# Patient Record
Sex: Female | Born: 1935 | Race: White | Hispanic: No | Smoking: Never smoker
Health system: Southern US, Community
[De-identification: ages and names within clinical notes are randomized; demographics above are authoritative.]

## PROBLEM LIST (undated history)

## (undated) DIAGNOSIS — T8859XA Other complications of anesthesia, initial encounter: Secondary | ICD-10-CM

## (undated) DIAGNOSIS — I5030 Unspecified diastolic (congestive) heart failure: Secondary | ICD-10-CM

## (undated) DIAGNOSIS — Z9889 Other specified postprocedural states: Secondary | ICD-10-CM

## (undated) DIAGNOSIS — D649 Anemia, unspecified: Secondary | ICD-10-CM

## (undated) DIAGNOSIS — I10 Essential (primary) hypertension: Secondary | ICD-10-CM

## (undated) DIAGNOSIS — Z7901 Long term (current) use of anticoagulants: Secondary | ICD-10-CM

## (undated) DIAGNOSIS — I4510 Unspecified right bundle-branch block: Secondary | ICD-10-CM

## (undated) DIAGNOSIS — F0150 Vascular dementia without behavioral disturbance: Secondary | ICD-10-CM

## (undated) DIAGNOSIS — K5790 Diverticulosis of intestine, part unspecified, without perforation or abscess without bleeding: Secondary | ICD-10-CM

## (undated) DIAGNOSIS — F0280 Dementia in other diseases classified elsewhere without behavioral disturbance: Secondary | ICD-10-CM

## (undated) DIAGNOSIS — J449 Chronic obstructive pulmonary disease, unspecified: Secondary | ICD-10-CM

## (undated) DIAGNOSIS — R112 Nausea with vomiting, unspecified: Secondary | ICD-10-CM

## (undated) DIAGNOSIS — F32A Depression, unspecified: Secondary | ICD-10-CM

## (undated) DIAGNOSIS — E039 Hypothyroidism, unspecified: Secondary | ICD-10-CM

## (undated) DIAGNOSIS — K52831 Collagenous colitis: Secondary | ICD-10-CM

## (undated) DIAGNOSIS — D0511 Intraductal carcinoma in situ of right breast: Secondary | ICD-10-CM

## (undated) DIAGNOSIS — E785 Hyperlipidemia, unspecified: Secondary | ICD-10-CM

## (undated) DIAGNOSIS — T4145XA Adverse effect of unspecified anesthetic, initial encounter: Secondary | ICD-10-CM

## (undated) DIAGNOSIS — I480 Paroxysmal atrial fibrillation: Secondary | ICD-10-CM

## (undated) DIAGNOSIS — Z9289 Personal history of other medical treatment: Secondary | ICD-10-CM

## (undated) DIAGNOSIS — Z8619 Personal history of other infectious and parasitic diseases: Secondary | ICD-10-CM

## (undated) DIAGNOSIS — M5136 Other intervertebral disc degeneration, lumbar region: Secondary | ICD-10-CM

## (undated) DIAGNOSIS — K219 Gastro-esophageal reflux disease without esophagitis: Secondary | ICD-10-CM

## (undated) DIAGNOSIS — G8929 Other chronic pain: Secondary | ICD-10-CM

## (undated) DIAGNOSIS — M545 Low back pain, unspecified: Secondary | ICD-10-CM

## (undated) DIAGNOSIS — I739 Peripheral vascular disease, unspecified: Secondary | ICD-10-CM

## (undated) DIAGNOSIS — M51369 Other intervertebral disc degeneration, lumbar region without mention of lumbar back pain or lower extremity pain: Secondary | ICD-10-CM

## (undated) DIAGNOSIS — M519 Unspecified thoracic, thoracolumbar and lumbosacral intervertebral disc disorder: Secondary | ICD-10-CM

## (undated) DIAGNOSIS — R609 Edema, unspecified: Secondary | ICD-10-CM

## (undated) DIAGNOSIS — N184 Chronic kidney disease, stage 4 (severe): Secondary | ICD-10-CM

## (undated) DIAGNOSIS — F329 Major depressive disorder, single episode, unspecified: Secondary | ICD-10-CM

## (undated) DIAGNOSIS — I82409 Acute embolism and thrombosis of unspecified deep veins of unspecified lower extremity: Secondary | ICD-10-CM

## (undated) DIAGNOSIS — M1990 Unspecified osteoarthritis, unspecified site: Secondary | ICD-10-CM

## (undated) DIAGNOSIS — C50911 Malignant neoplasm of unspecified site of right female breast: Secondary | ICD-10-CM

## (undated) HISTORY — PX: TMJ ARTHROPLASTY: SHX1066

## (undated) HISTORY — DX: Essential (primary) hypertension: I10

## (undated) HISTORY — DX: Hypothyroidism, unspecified: E03.9

## (undated) HISTORY — PX: APPENDECTOMY: SHX54

## (undated) HISTORY — PX: REVISION TOTAL KNEE ARTHROPLASTY: SUR1280

## (undated) HISTORY — PX: TOTAL KNEE ARTHROPLASTY: SHX125

## (undated) HISTORY — PX: LUMBAR DISC SURGERY: SHX700

## (undated) HISTORY — PX: ABDOMINAL EXPLORATION SURGERY: SHX538

## (undated) HISTORY — PX: VASCULAR SURGERY: SHX849

## (undated) HISTORY — PX: TONSILLECTOMY: SUR1361

## (undated) HISTORY — PX: HALLUX VALGUS CORRECTION: SUR315

## (undated) HISTORY — PX: BREAST SURGERY: SHX581

## (undated) HISTORY — PX: ABDOMINAL HYSTERECTOMY: SHX81

## (undated) HISTORY — PX: TEMPOROMANDIBULAR JOINT ARTHROPLASTY: SUR76

## (undated) HISTORY — DX: Intraductal carcinoma in situ of right breast: D05.11

## (undated) HISTORY — PX: BACK SURGERY: SHX140

## (undated) HISTORY — PX: COLONOSCOPY: SHX174

## (undated) HISTORY — PX: JOINT REPLACEMENT: SHX530

## (undated) HISTORY — PX: EYE SURGERY: SHX253

## (undated) HISTORY — DX: Hyperlipidemia, unspecified: E78.5

---

## 2004-02-01 ENCOUNTER — Ambulatory Visit: Payer: Self-pay | Admitting: Internal Medicine

## 2004-03-22 ENCOUNTER — Ambulatory Visit: Payer: Self-pay | Admitting: Internal Medicine

## 2004-11-29 ENCOUNTER — Ambulatory Visit: Payer: Self-pay | Admitting: Internal Medicine

## 2004-12-02 ENCOUNTER — Ambulatory Visit: Payer: Self-pay | Admitting: Internal Medicine

## 2005-01-31 ENCOUNTER — Ambulatory Visit: Payer: Self-pay | Admitting: Internal Medicine

## 2005-02-03 ENCOUNTER — Ambulatory Visit: Payer: Self-pay | Admitting: Internal Medicine

## 2005-03-23 ENCOUNTER — Ambulatory Visit: Payer: Self-pay | Admitting: Internal Medicine

## 2005-04-14 ENCOUNTER — Ambulatory Visit: Payer: Self-pay | Admitting: Gastroenterology

## 2005-04-17 DIAGNOSIS — C50911 Malignant neoplasm of unspecified site of right female breast: Secondary | ICD-10-CM

## 2005-04-17 HISTORY — PX: BREAST EXCISIONAL BIOPSY: SUR124

## 2005-04-17 HISTORY — DX: Malignant neoplasm of unspecified site of right female breast: C50.911

## 2005-04-25 ENCOUNTER — Ambulatory Visit: Payer: Self-pay | Admitting: Internal Medicine

## 2005-06-15 ENCOUNTER — Ambulatory Visit: Payer: Self-pay | Admitting: Surgery

## 2005-06-30 ENCOUNTER — Ambulatory Visit: Payer: Self-pay | Admitting: Surgery

## 2005-07-13 ENCOUNTER — Ambulatory Visit: Payer: Self-pay | Admitting: Oncology

## 2005-07-25 ENCOUNTER — Ambulatory Visit: Payer: Self-pay | Admitting: Radiation Oncology

## 2005-08-15 ENCOUNTER — Ambulatory Visit: Payer: Self-pay | Admitting: Radiation Oncology

## 2005-09-15 ENCOUNTER — Ambulatory Visit: Payer: Self-pay | Admitting: Radiation Oncology

## 2005-10-15 ENCOUNTER — Ambulatory Visit: Payer: Self-pay | Admitting: Radiation Oncology

## 2006-02-12 ENCOUNTER — Ambulatory Visit: Payer: Self-pay | Admitting: Oncology

## 2006-04-02 ENCOUNTER — Ambulatory Visit: Payer: Self-pay | Admitting: Radiation Oncology

## 2006-06-13 ENCOUNTER — Ambulatory Visit: Payer: Self-pay | Admitting: Oncology

## 2006-07-17 ENCOUNTER — Ambulatory Visit: Payer: Self-pay | Admitting: Oncology

## 2006-08-13 ENCOUNTER — Ambulatory Visit: Payer: Self-pay | Admitting: Oncology

## 2006-08-16 ENCOUNTER — Ambulatory Visit: Payer: Self-pay | Admitting: Oncology

## 2006-09-16 ENCOUNTER — Ambulatory Visit: Payer: Self-pay | Admitting: Radiation Oncology

## 2006-10-01 ENCOUNTER — Ambulatory Visit: Payer: Self-pay | Admitting: Radiation Oncology

## 2006-10-16 ENCOUNTER — Ambulatory Visit: Payer: Self-pay | Admitting: Radiation Oncology

## 2007-02-11 ENCOUNTER — Ambulatory Visit: Payer: Self-pay | Admitting: Oncology

## 2007-02-16 ENCOUNTER — Ambulatory Visit: Payer: Self-pay | Admitting: Oncology

## 2007-03-21 ENCOUNTER — Ambulatory Visit: Payer: Self-pay

## 2007-07-10 ENCOUNTER — Ambulatory Visit: Payer: Self-pay | Admitting: Oncology

## 2007-09-16 ENCOUNTER — Ambulatory Visit: Payer: Self-pay | Admitting: Radiation Oncology

## 2007-10-16 ENCOUNTER — Ambulatory Visit: Payer: Self-pay | Admitting: Oncology

## 2008-07-10 ENCOUNTER — Ambulatory Visit: Payer: Self-pay | Admitting: Internal Medicine

## 2008-09-18 ENCOUNTER — Ambulatory Visit: Payer: Self-pay | Admitting: Internal Medicine

## 2009-07-13 ENCOUNTER — Ambulatory Visit: Payer: Self-pay | Admitting: Internal Medicine

## 2009-07-16 ENCOUNTER — Ambulatory Visit: Payer: Self-pay | Admitting: Oncology

## 2009-07-19 ENCOUNTER — Ambulatory Visit: Payer: Self-pay | Admitting: Oncology

## 2009-08-15 ENCOUNTER — Ambulatory Visit: Payer: Self-pay | Admitting: Oncology

## 2010-07-18 ENCOUNTER — Ambulatory Visit: Payer: Self-pay | Admitting: Oncology

## 2010-07-25 ENCOUNTER — Ambulatory Visit: Payer: Self-pay | Admitting: Internal Medicine

## 2010-07-27 ENCOUNTER — Encounter: Payer: Self-pay | Admitting: Cardiology

## 2010-08-09 ENCOUNTER — Encounter: Payer: Self-pay | Admitting: Cardiology

## 2010-08-09 ENCOUNTER — Ambulatory Visit (INDEPENDENT_AMBULATORY_CARE_PROVIDER_SITE_OTHER): Payer: Medicare Other | Admitting: Cardiology

## 2010-08-09 DIAGNOSIS — I4891 Unspecified atrial fibrillation: Secondary | ICD-10-CM

## 2010-08-09 DIAGNOSIS — I10 Essential (primary) hypertension: Secondary | ICD-10-CM

## 2010-08-09 DIAGNOSIS — I480 Paroxysmal atrial fibrillation: Secondary | ICD-10-CM | POA: Insufficient documentation

## 2010-08-09 DIAGNOSIS — R079 Chest pain, unspecified: Secondary | ICD-10-CM

## 2010-08-09 NOTE — Assessment & Plan Note (Signed)
Vague chest pain. Plan Myoview.

## 2010-08-09 NOTE — Assessment & Plan Note (Addendum)
Patient has new onset atrial fibrillation. She has been on pradaxa 150 mg p.o. B.i.d. since April 11 of 2012. She is symptomatic with fatigue, dyspnea and worsening pedal edema. I will plan to proceed with elective cardioversion in 2 weeks. She will have been on therapeutic anticoagulation for over 3 weeks at that time. Continue present medications for rate control. If she develops recurrent atrial fibrillation following cardioversion then we will most likely add amiodarone. I will obtain laboratories from Dr. Ammie Ferrier office concerning most recent TSH. Schedule echocardiogram. She has embolic risk factors of hypertension, age 77 and female sex. She will therefore require long-term anticoagulation.

## 2010-08-09 NOTE — Assessment & Plan Note (Signed)
Blood pressure controlled with present medications. Will continue.

## 2010-08-09 NOTE — Progress Notes (Signed)
HPI: 77 yo female for evaluation of atrial fibrillation. Patient states that for the past one month she has noticed increasing fatigue, dyspnea on exertion, pedal edema and palpitations. She occasionally feels a tightness in her chest but this is not exertional. It resolved spontaneously. She was seen by Dr. Sabra Heck on July 27, 2010. He noted that she was in new onset atrial fibrillation and started her on Cardizem, beta blockade and pradaxa. She has noted increased edema but there has been response to diuretics. Because of her atrial fibrillation we were asked to further evaluate.  Current Outpatient Prescriptions  Medication Sig Dispense Refill  . calcium carbonate (OS-CAL) 600 MG TABS Take 600 mg by mouth 2 (two) times daily with a meal.        . celecoxib (CELEBREX) 200 MG capsule Take 200 mg by mouth daily.        . dabigatran (PRADAXA) 150 MG CAPS Take 150 mg by mouth every 12 (twelve) hours.        Marland Kitchen diltiazem (CARDIZEM) 90 MG tablet Take 90 mg by mouth 3 (three) times daily.        Marland Kitchen Fluvastatin Sodium (LESCOL PO) Take 30 mg by mouth daily.        Marland Kitchen levothyroxine (SYNTHROID, LEVOTHROID) 150 MCG tablet Take 150 mcg by mouth daily.        . metoprolol (LOPRESSOR) 50 MG tablet Take 50 mg by mouth 3 (three) times daily.        . Multiple Vitamin (MULTIVITAMIN) tablet Take 1 tablet by mouth daily.        Marland Kitchen omeprazole (PRILOSEC) 20 MG capsule Take 20 mg by mouth daily.        . potassium chloride (KLOR-CON) 10 MEQ CR tablet Take 10 mEq by mouth daily.        . tamoxifen (NOLVADEX) 20 MG tablet Take 20 mg by mouth daily.        Marland Kitchen torsemide (DEMADEX) 20 MG tablet Take 20 mg by mouth 2 (two) times daily.        Marland Kitchen venlafaxine (EFFEXOR-XR) 75 MG 24 hr capsule Take 75 mg by mouth daily.          No Known Allergies  Past Medical History  Diagnosis Date  . Hypertension   . Hyperlipidemia   . Hypothyroid   . Atrial fibrillation     Past Surgical History  Procedure Date  . Abdominal  hysterectomy   . Back surgery   . Total knee arthroplasty   . Tmj arthroplasty   . Tonsillectomy   . Appendectomy     History   Social History  . Marital Status: Married    Spouse Name: N/A    Number of Children: 2  . Years of Education: N/A   Occupational History  . Not on file.   Social History Main Topics  . Smoking status: Never Smoker   . Smokeless tobacco: Not on file  . Alcohol Use: No  . Drug Use: Not on file  . Sexually Active: Not on file   Other Topics Concern  . Not on file   Social History Narrative  . No narrative on file    Family History  Problem Relation Age of Onset  . Coronary artery disease Sister 61    MI    ROS: Fatigue but no fevers or chills, productive cough, hemoptysis, dysphasia, odynophagia, melena, hematochezia, dysuria, hematuria, rash, seizure activity, orthopnea, PND, claudication. Remaining systems are negative.  Physical Exam: General:  Well developed/well nourished in NAD Skin warm/dry Patient not depressed No peripheral clubbing Back-normal HEENT-normal/normal eyelids Neck supple/normal carotid upstroke bilaterally; no bruits; no JVD; no thyromegaly chest - CTA/ normal expansion CV - irregular/normal S1 and S2; no murmurs, rubs or gallops;  PMI nondisplaced Abdomen -NT/ND, no HSM, no mass, + bowel sounds, no bruit 2+ femoral pulses, no bruits Ext-1+ ankle edema, chords, 2+ DP Neuro-grossly nonfocal  ECG Atrial fibrillation, Nonspecific ST changes.

## 2010-08-09 NOTE — Patient Instructions (Addendum)
Your physician recommends that you schedule a follow-up appointment in: 8 weeks with Dr. Stanford Breed  Your physician has requested that you have an echocardiogram. Echocardiography is a painless test that uses sound waves to create images of your heart. It provides your doctor with information about the size and shape of your heart and how well your heart's chambers and valves are working. This procedure takes approximately one hour. There are no restrictions for this procedure. 427.31  Your physician has requested that you have a lexiscan myoview. For further information please visit [email or web address]. Please follow instruction sheet, as given. 786.5

## 2010-08-16 ENCOUNTER — Ambulatory Visit (HOSPITAL_COMMUNITY): Payer: Medicare Other | Attending: Cardiology

## 2010-08-16 ENCOUNTER — Ambulatory Visit: Payer: Self-pay | Admitting: Oncology

## 2010-08-16 ENCOUNTER — Ambulatory Visit (HOSPITAL_COMMUNITY): Payer: Medicare Other | Attending: Cardiology | Admitting: Radiology

## 2010-08-16 DIAGNOSIS — R0789 Other chest pain: Secondary | ICD-10-CM

## 2010-08-16 DIAGNOSIS — I4891 Unspecified atrial fibrillation: Secondary | ICD-10-CM

## 2010-08-16 DIAGNOSIS — R0602 Shortness of breath: Secondary | ICD-10-CM

## 2010-08-16 DIAGNOSIS — R079 Chest pain, unspecified: Secondary | ICD-10-CM | POA: Insufficient documentation

## 2010-08-16 MED ORDER — REGADENOSON 0.4 MG/5ML IV SOLN
0.4000 mg | Freq: Once | INTRAVENOUS | Status: AC
  Administered 2010-08-16: 0.4 mg via INTRAVENOUS

## 2010-08-16 MED ORDER — TECHNETIUM TC 99M TETROFOSMIN IV KIT
10.9000 | PACK | Freq: Once | INTRAVENOUS | Status: AC | PRN
  Administered 2010-08-16: 10.9 via INTRAVENOUS

## 2010-08-16 MED ORDER — TECHNETIUM TC 99M TETROFOSMIN IV KIT
33.0000 | PACK | Freq: Once | INTRAVENOUS | Status: AC | PRN
  Administered 2010-08-16: 33 via INTRAVENOUS

## 2010-08-16 NOTE — Progress Notes (Signed)
[place] [address] [phone]  Cardiology Nuclear Med Pinki Biviano Yeargan is a 77 y.o. female [id number] 09-20-1932   Nuclear Med Background Indication for Stress Test:  Evaluation for Ischemia and 07/27/10 New AFIB, pending cardioversion, started on pradaxa History:  No previous documented CAD Cardiac Risk Factors: Family History - CAD, Hypertension and Lipids  Symptoms:  Chest Tightness, DOE, Fatigue, Palpitations and SOB   Nuclear Pre-Procedure Caffeine/Decaff Intake:  None NPO After: 7:00pm   Lungs:  clear IV 0.9% NS with Angio Cath:  20g  IV Site: L Antecubital  IV Started by:  Irven Baltimore, RN  Chest Size (in):  36 Cup Size: B  Height: 5\' 10"  (1.778 m)  Weight:  176 lb (79.833 kg)  BMI:  Body mass index is 25.25 kg/(m^2). Tech Comments: Took Metoprolol, and Diltiazem 8:00 am today     Nuclear Med Study 1 or 2 day study: 1 day  Stress Test Type:  Carlton Adam  Reading MD: Glori Bickers, MD  Order Authorizing Provider:  B.Crenshaw  Resting Radionuclide: Technetium 67m Tetrofosmin  Resting Radionuclide Dose: 10.9 mCi   Stress Radionuclide:  Technetium 12m Tetrofosmin  Stress Radionuclide Dose: 33 mCi           Stress Protocol Rest HR:71 Stress HR: 81  Rest BP: 121/57 Stress BP: 124/58  Exercise Time (min): n/a METS: n/a   Predicted Max HR: 145 bpm % Max HR: 55.86 bpm Rate Pressure Product: 10044   Dose of Adenosine (mg):  n/a Dose of Lexiscan: 0.4 mg  Dose of Atropine (mg): n/a Dose of Dobutamine: n/a mcg/kg/min (at max HR)  Stress Test Technologist: Perrin Maltese, EMT-P  Nuclear Technologist:  Charlton Amor, CNMT     Rest Procedure:  Myocardial perfusion imaging was performed at rest 45 minutes following the intravenous administration of Technetium 71m Tetrofosmin. Rest ECG: Atrial Fibrilliation  Stress Procedure:  The patient received IV Lexiscan 0.4 mg over 15-seconds.  Technetium 78m  Tetrofosmin injected at 30-seconds.  There were no significant changes with Lexiscan.  Quantitative spect images were obtained after a 45 minute delay. Stress ECG: No significant ST segment change suggestive of ischemia.  QPS Raw Data Images:  Normal; no motion artifact; normal heart/lung ratio. Stress Images:  Normal homogeneous uptake in all areas of the myocardium. Rest Images:  Normal homogeneous uptake in all areas of the myocardium. Subtraction (SDS):  Normal Transient Ischemic Dilatation (Normal <1.22): .95 Lung/Heart Ratio (Normal <0.45):  .32  Quantitative Gated Spect Images QGS EDV:  60 ml QGS ESV:  16 ml QGS cine images:  NL LV Function; NL Wall Motion QGS EF: 73%  Impression Exercise Capacity:  Lexiscan with no exercise. BP Response:  n/a Clinical Symptoms:  n/a ECG Impression:  No significant ECG changes with Lexiscan. Comparison with Prior Nuclear Study: No previous nuclear study performed  Overall Impression:  Normal stress nuclear study.       Daniel Bensimhon

## 2010-08-17 NOTE — Progress Notes (Signed)
COPY ROUTED TO DR.CRENSHAW.[place]

## 2010-08-22 NOTE — Progress Notes (Signed)
pt aware of results [name]  

## 2010-08-25 ENCOUNTER — Ambulatory Visit (HOSPITAL_COMMUNITY)
Admission: RE | Admit: 2010-08-25 | Discharge: 2010-08-25 | Disposition: A | Discharge status: Home / Self Care | Payer: Medicare Other | Source: Ambulatory Visit | Attending: Cardiology | Admitting: Cardiology

## 2010-08-25 DIAGNOSIS — I4891 Unspecified atrial fibrillation: Secondary | ICD-10-CM | POA: Insufficient documentation

## 2010-08-25 LAB — BASIC METABOLIC PANEL
Calcium: 9.4 mg/dL (ref 8.4–10.5)
GFR calc non Af Amer: 53 mL/min — ABNORMAL LOW (ref >60)
Potassium: 4.3 mEq/L (ref 3.5–5.1)
Sodium: 143 mEq/L (ref 135–145)

## 2010-08-25 LAB — CBC
MCHC: 33.6 g/dL (ref 30.0–36.0)
Platelets: 193 10*3/uL (ref 150–400)
RDW: 12.8 % (ref 11.5–15.5)
WBC: 9 10*3/uL (ref 4.0–10.5)

## 2010-08-26 HISTORY — PX: CARDIOVERSION: SHX1299

## 2010-08-26 NOTE — Assessment & Plan Note (Addendum)
  [facility]  NAME:  [name], [name] NO.:  [id number]  MEDICAL RECORD NO.:  [id number]      DATE OF BIRTH:  Jun 03, 1932  PHYSICIAN:  Denice Bors. Stanford Breed, MD, [facility] VISIT DATE:  08/25/2010                                  OFFICE VISIT   This is cardioversion of atrial fibrillation.  The patient was sedated by Anesthesia with Diprivan 100 mg intravenously.  Synchronized cardioversion with 120 joules resulted in normal sinus rhythm.  There were no immediate complications.  We would recommend continuing all medications including Pradaxa and follow up as scheduled.     Denice Bors Stanford Breed, MD, [facility]     BSC/MEDQ  D:  08/25/2010  T:  08/26/2010  Job:  [id number]  Electronically Signed by Kirk Ruths MD [facility] on 08/29/2010 06:11:41 PM

## 2010-08-29 ENCOUNTER — Encounter: Payer: Self-pay | Admitting: Cardiology

## 2010-10-06 ENCOUNTER — Encounter: Payer: Self-pay | Admitting: Cardiology

## 2010-10-06 ENCOUNTER — Ambulatory Visit (INDEPENDENT_AMBULATORY_CARE_PROVIDER_SITE_OTHER): Payer: Medicare Other | Admitting: Cardiology

## 2010-10-06 DIAGNOSIS — Z79899 Other long term (current) drug therapy: Secondary | ICD-10-CM

## 2010-10-06 DIAGNOSIS — I4891 Unspecified atrial fibrillation: Secondary | ICD-10-CM

## 2010-10-06 DIAGNOSIS — I10 Essential (primary) hypertension: Secondary | ICD-10-CM

## 2010-10-06 LAB — BASIC METABOLIC PANEL
CO2: 30 mEq/L (ref 19–32)
Calcium: 9 mg/dL (ref 8.4–10.5)
Chloride: 107 mEq/L (ref 96–112)
Sodium: 142 mEq/L (ref 135–145)

## 2010-10-06 LAB — CBC WITH DIFFERENTIAL/PLATELET
Basophils Relative: 0.4 % (ref 0.0–3.0)
Eosinophils Absolute: 0.2 10*3/uL (ref 0.0–0.7)
Eosinophils Relative: 1.4 % (ref 0.0–5.0)
Hemoglobin: 13.5 g/dL (ref 12.0–15.0)
Lymphocytes Relative: 23.4 % (ref 12.0–46.0)
MCHC: 34.1 g/dL (ref 30.0–36.0)
MCV: 98.5 fl (ref 78.0–100.0)
Neutro Abs: 7.8 10*3/uL — ABNORMAL HIGH (ref 1.4–7.7)
RBC: 4.02 Mil/uL (ref 3.87–5.11)
WBC: 12.1 10*3/uL — ABNORMAL HIGH (ref 4.5–10.5)

## 2010-10-06 NOTE — Patient Instructions (Signed)
Your physician recommends that you schedule a follow-up appointment in: [place] physician recommends that you return for lab work in: [place] CD 240MG  [place]

## 2010-10-06 NOTE — Assessment & Plan Note (Signed)
Patient remains in sinus rhythm. Continue beta blocker and calcium blocker. She has embolic risk factors of female sex, age and hypertension. Continue pradaxa. Check renal function and hemoglobin. Patient has recurrent atrial fibrillation in the future will consider amiodarone.

## 2010-10-06 NOTE — Progress Notes (Signed)
HPI: 77 yo female I initially saw in April of 2012 for evaluation of atrial fibrillation. Echocardiogram in May 2012 showed normal LV function, moderate left atrial enlargement, mild right atrial enlargement, mild aortic and mitral regurgitation and mild-to-moderate tricuspid regurgitation. Myoview in May of 2012 showed an ejection fraction of 73% and normal perfusion. Patient underwent elective DCCV on 08/26/10. Since then, her dyspnea is much improved. She does have some dyspnea on exertion walking up hills. There is no orthopnea, PND, chest pain, palpitations, syncope or bleeding. Her pedal edema has improved but she continues to have mild edema.   Current Outpatient Prescriptions  Medication Sig Dispense Refill  . calcium carbonate (OS-CAL) 600 MG TABS Take 600 mg by mouth 2 (two) times daily with a meal.        . celecoxib (CELEBREX) 200 MG capsule Take 200 mg by mouth daily.        . dabigatran (PRADAXA) 150 MG CAPS Take 150 mg by mouth every 12 (twelve) hours.        Marland Kitchen diltiazem (CARDIZEM) 90 MG tablet Take 90 mg by mouth 3 (three) times daily.        Marland Kitchen Fluvastatin Sodium (LESCOL PO) Take 30 mg by mouth daily.        Marland Kitchen levothyroxine (SYNTHROID, LEVOTHROID) 150 MCG tablet Take 150 mcg by mouth daily.        . metoprolol (LOPRESSOR) 50 MG tablet Take 50 mg by mouth 3 (three) times daily.        . Multiple Vitamin (MULTIVITAMIN) tablet Take 1 tablet by mouth daily.        Marland Kitchen omeprazole (PRILOSEC) 20 MG capsule Take 20 mg by mouth daily.        . pantoprazole (PROTONIX) 20 MG tablet Take 20 mg by mouth 2 (two) times daily.        . potassium chloride (KLOR-CON) 10 MEQ CR tablet Take 10 mEq by mouth daily.        Marland Kitchen torsemide (DEMADEX) 20 MG tablet Take 20 mg by mouth 2 (two) times daily.        Marland Kitchen venlafaxine (EFFEXOR-XR) 75 MG 24 hr capsule Take 75 mg by mouth daily.        Marland Kitchen DISCONTD: tamoxifen (NOLVADEX) 20 MG tablet Take 20 mg by mouth daily.           Past Medical History  Diagnosis Date    . Hypertension   . Hyperlipidemia   . Hypothyroid   . Atrial fibrillation     Past Surgical History  Procedure Date  . Abdominal hysterectomy   . Back surgery   . Total knee arthroplasty   . Tmj arthroplasty   . Tonsillectomy   . Appendectomy     History   Social History  . Marital Status: Married    Spouse Name: N/A    Number of Children: 2  . Years of Education: N/A   Occupational History  . Not on file.   Social History Main Topics  . Smoking status: Never Smoker   . Smokeless tobacco: Not on file  . Alcohol Use: No  . Drug Use: Not on file  . Sexually Active: Not on file   Other Topics Concern  . Not on file   Social History Narrative  . No narrative on file    ROS: no fevers or chills, productive cough, hemoptysis, dysphasia, odynophagia, melena, hematochezia, dysuria, hematuria, rash, seizure activity, orthopnea, PND, pedal edema, claudication. Remaining systems are negative.  Physical Exam: Well-developed well-nourished in no acute distress.  Skin is warm and dry.  HEENT is normal.  Neck is supple. No thyromegaly.  Chest is clear to auscultation with normal expansion.  Cardiovascular exam is regular rate and rhythm.  Abdominal exam nontender or distended. No masses palpated. Extremities show 1+ ankle edema. neuro grossly intact  ECG Normal sinus rhythm at a rate of 66. No ST changes.

## 2010-10-06 NOTE — Assessment & Plan Note (Signed)
Blood pressure controlled. Continue present medications. 

## 2010-10-18 NOTE — Progress Notes (Signed)
Addended by: Doug Sou D on: 10/18/2010 12:13 PM   Modules accepted: Orders

## 2010-10-21 ENCOUNTER — Other Ambulatory Visit (HOSPITAL_BASED_OUTPATIENT_CLINIC_OR_DEPARTMENT_OTHER): Payer: Self-pay | Admitting: General Surgery

## 2010-10-21 ENCOUNTER — Encounter (HOSPITAL_BASED_OUTPATIENT_CLINIC_OR_DEPARTMENT_OTHER): Payer: Medicare Other | Attending: General Surgery

## 2010-10-21 ENCOUNTER — Ambulatory Visit (HOSPITAL_COMMUNITY)
Admission: RE | Admit: 2010-10-21 | Discharge: 2010-10-21 | Disposition: A | Discharge status: Home / Self Care | Payer: Medicare Other | Source: Ambulatory Visit | Attending: General Surgery | Admitting: General Surgery

## 2010-10-21 DIAGNOSIS — Z9089 Acquired absence of other organs: Secondary | ICD-10-CM | POA: Insufficient documentation

## 2010-10-21 DIAGNOSIS — I739 Peripheral vascular disease, unspecified: Secondary | ICD-10-CM | POA: Insufficient documentation

## 2010-10-21 DIAGNOSIS — E039 Hypothyroidism, unspecified: Secondary | ICD-10-CM | POA: Insufficient documentation

## 2010-10-21 DIAGNOSIS — Z923 Personal history of irradiation: Secondary | ICD-10-CM | POA: Insufficient documentation

## 2010-10-21 DIAGNOSIS — Z79899 Other long term (current) drug therapy: Secondary | ICD-10-CM | POA: Insufficient documentation

## 2010-10-21 DIAGNOSIS — M25579 Pain in unspecified ankle and joints of unspecified foot: Secondary | ICD-10-CM | POA: Insufficient documentation

## 2010-10-21 DIAGNOSIS — M869 Osteomyelitis, unspecified: Secondary | ICD-10-CM

## 2010-10-21 DIAGNOSIS — Z90710 Acquired absence of both cervix and uterus: Secondary | ICD-10-CM | POA: Insufficient documentation

## 2010-10-21 DIAGNOSIS — Z853 Personal history of malignant neoplasm of breast: Secondary | ICD-10-CM | POA: Insufficient documentation

## 2010-10-21 DIAGNOSIS — L97309 Non-pressure chronic ulcer of unspecified ankle with unspecified severity: Secondary | ICD-10-CM | POA: Insufficient documentation

## 2010-10-21 DIAGNOSIS — Z96659 Presence of unspecified artificial knee joint: Secondary | ICD-10-CM | POA: Insufficient documentation

## 2010-10-21 DIAGNOSIS — I10 Essential (primary) hypertension: Secondary | ICD-10-CM | POA: Insufficient documentation

## 2010-10-22 NOTE — Assessment & Plan Note (Unsigned)
[facility]  NAME:  [name], [name] NO.:  [id number]  MEDICAL RECORD NO.:  [id number]      DATE OF BIRTH:  May 15, 1932  PHYSICIAN:  Kathrin Penner, M.D.    VISIT DATE:  10/21/2010                                  OFFICE VISIT   PROBLEMS:  Nonhealing ulcer of the right lateral malleolus present for the past 3 months.  The patient's current therapy has been with Bactroban b.i.d. without any significant success towards healing.  The surrounding tissues are swollen and red and quite exquisitely painful.  CURRENT MEDICATIONS:  Calcium supplements, Pradaxa, Cardizem, Synthroid, Lopressor, Prilosec, Demadex, Effexor, pravastatin and multivitamins.  ALLERGIES:  No known drug allergies.  PAST MEDICAL AND SURGICAL HISTORY:  Peripheral artery disease, history of atrial fibrillation, status post cardioversion, hypothyroidism, hypertension.  The patient is status post right-sided lumpectomy, carcinoma of the breasts followed by radiation therapy.  Appendectomy in the past, total knee replacement on the right side in the past, low back surgery, total abdominal hysterectomy, nephrostomy.  SOCIAL HISTORY:  This is married white English-speaking female, no tobacco, alcohol or recreational drug use.  She is competent here today with her spouse.  REVIEW OF SYSTEMS:  Negative except as above.  PHYSICAL EXAMINATION:  VITAL SIGNS:  This patient is 5 feet and 10 inches, weighing 175 pounds, temperature is 98.7, pulse is 58, respirations 16, blood pressure is 146/70. HEAD AND NECK:  The head is normocephalic.  There is no scleral icterus. The oropharynx and nasopharynx are benign.  Neck is supple.  There is no thyromegaly or adenopathy. LUNGS:  Clear to auscultation bilaterally. HEART:  Shows a regular rate and rhythm.  There are no rubs or gallop rhythms heard. ABDOMEN:  Soft, nontender, nondistended.  Bowel sounds are normoactive. EXTREMITIES:  On the  right show a slight edema with erythema and exquisite tenderness surrounding a 0.3 x 0.3 x 0.1 cm ulcer, which is right over the right lateral malleolus.  There is minimal drainage and no odor from this wound.  TREATMENT PLAN:  The wound is clean and probed.  I was not able to probe down to the bone.  Cultures are done and we will send it for x-rays to rule out the possibility of osteomyelitis.  In the interim, we will go ahead and check her hemoglobin A1c, complete metabolic panel, beta- natriuretic peptide, and CBC and sed rate.  Treatment to the base of the wound would be Medihoney the surrounding tissues, treated with Bactroban.  I have given her prescription for doxycycline 100 mg b.i.d.  We will follow up with her again in 1 week.     Kathrin Penner, M.D.     PB/MEDQ  D:  10/21/2010  T:  10/22/2010  Job:  [id number]

## 2010-11-18 ENCOUNTER — Encounter (HOSPITAL_BASED_OUTPATIENT_CLINIC_OR_DEPARTMENT_OTHER): Payer: Medicare Other

## 2011-01-10 ENCOUNTER — Ambulatory Visit: Payer: Medicare Other | Admitting: Cardiology

## 2011-02-03 ENCOUNTER — Encounter: Payer: Self-pay | Admitting: Nurse Practitioner

## 2011-02-03 ENCOUNTER — Encounter: Payer: Self-pay | Admitting: Cardiothoracic Surgery

## 2011-02-14 ENCOUNTER — Ambulatory Visit: Payer: Medicare Other | Admitting: Cardiology

## 2011-02-16 ENCOUNTER — Encounter: Payer: Self-pay | Admitting: Cardiothoracic Surgery

## 2011-02-16 ENCOUNTER — Encounter: Payer: Self-pay | Admitting: Nurse Practitioner

## 2011-03-18 ENCOUNTER — Encounter: Payer: Self-pay | Admitting: Cardiothoracic Surgery

## 2011-03-18 ENCOUNTER — Encounter: Payer: Self-pay | Admitting: Nurse Practitioner

## 2011-04-18 HISTORY — PX: OTHER SURGICAL HISTORY: SHX169

## 2011-04-18 HISTORY — PX: MASTECTOMY: SHX3

## 2011-05-30 ENCOUNTER — Encounter: Payer: Self-pay | Admitting: Physician Assistant

## 2011-09-13 ENCOUNTER — Ambulatory Visit: Payer: Self-pay | Admitting: Oncology

## 2011-09-13 ENCOUNTER — Encounter: Payer: Self-pay | Admitting: Physician Assistant

## 2011-09-13 LAB — CBC CANCER CENTER
Basophil #: 0.1 x10 3/mm (ref 0.0–0.1)
Eosinophil #: 0.2 x10 3/mm (ref 0.0–0.7)
HCT: 39.1 % (ref 35.0–47.0)
HGB: 12.8 g/dL (ref 12.0–16.0)
Lymphocyte #: 2.2 x10 3/mm (ref 1.0–3.6)
Monocyte #: 1.1 x10 3/mm — ABNORMAL HIGH (ref 0.2–0.9)
Neutrophil #: 5 x10 3/mm (ref 1.4–6.5)
Neutrophil %: 58.3 %
Platelet: 264 x10 3/mm (ref 150–440)

## 2011-09-13 LAB — COMPREHENSIVE METABOLIC PANEL
Alkaline Phosphatase: 84 U/L (ref 50–136)
Anion Gap: 10 (ref 7–16)
Bilirubin,Total: 0.4 mg/dL (ref 0.2–1.0)
Chloride: 102 mmol/L (ref 98–107)
Co2: 30 mmol/L (ref 21–32)
Creatinine: 1.61 mg/dL — ABNORMAL HIGH (ref 0.60–1.30)
EGFR (Non-African Amer.): 31 — ABNORMAL LOW
Glucose: 95 mg/dL (ref 65–99)
Potassium: 3.6 mmol/L (ref 3.5–5.1)
SGOT(AST): 19 U/L (ref 15–37)
Total Protein: 7.1 g/dL (ref 6.4–8.2)

## 2011-09-14 ENCOUNTER — Encounter: Payer: Self-pay | Admitting: Physician Assistant

## 2011-09-14 ENCOUNTER — Ambulatory Visit: Payer: Medicare Other | Admitting: Physician Assistant

## 2011-09-14 ENCOUNTER — Ambulatory Visit (INDEPENDENT_AMBULATORY_CARE_PROVIDER_SITE_OTHER): Payer: Medicare Other | Admitting: Physician Assistant

## 2011-09-14 VITALS — BP 102/58 | HR 81 | Ht 70.0 in | Wt 185.0 lb

## 2011-09-14 DIAGNOSIS — I4891 Unspecified atrial fibrillation: Secondary | ICD-10-CM

## 2011-09-14 DIAGNOSIS — N289 Disorder of kidney and ureter, unspecified: Secondary | ICD-10-CM

## 2011-09-14 DIAGNOSIS — I10 Essential (primary) hypertension: Secondary | ICD-10-CM

## 2011-09-14 MED ORDER — DILTIAZEM HCL ER COATED BEADS 240 MG PO CP24: 240.0000 mg | ORAL_CAPSULE | Freq: Every day | ORAL | Status: DC

## 2011-09-14 MED ORDER — METOPROLOL TARTRATE 100 MG PO TABS: 100.0000 mg | ORAL_TABLET | Freq: Two times a day (BID) | ORAL | Status: DC

## 2011-09-14 NOTE — Patient Instructions (Addendum)
Your physician recommends that you schedule a follow-up appointment in: 10/17/1308:15 WITH DR. CRENSHAW  STOP DILTIAZEM 90 MG AND LOPRESSOR 50 MG   START CARDIZEM CD 240 MG 1 CAP DAILY START LOPRESSOR 100 MG TWICE DAILY

## 2011-09-14 NOTE — Progress Notes (Signed)
[address], [place]  [address] Phone: [phone] Fax:  [phone]  Date:  09/14/2011   Name:  Shalamar Juhasz Dragos   DOB:  [birth year]   MRN:  [id number]  PCP:  Rusty Aus., MD, MD  Primary Cardiologist:  Dr. Kirk Ruths  Primary Electrophysiologist:  None    History of Present Illness: [name] is a 78 y.o. female who returns for follow up on AFib.    Established with Dr. Kirk Ruths in 07/2010 for evaluation of atrial fibrillation.  Echocardiogram 5/12: normal LV function, moderate LAE, mild RAE, mild AI and MR and mild-to-moderate TR.  Myoview 5/ 12 showed an ejection fraction of 73% and normal perfusion.   Patient underwent elective DCCV on 08/26/10.  Last seen by Dr. Kirk Ruths in 6/12 with plans for 3 mos follow up.    She has recently been going to [facility] for treatment of the venous stasis ulcer on her right foot.  She recently saw her PCP with complaints of increased heart rate, fatigue and shortness of breath.  She was noted to be back in atrial fibrillation.  Her rate was apparently controlled and no medication changes were made.  She was asked to follow up here.  She is having symptoms like she did when she was in Atrial fibrillation previously.  She notes rapid palpitations with increased activity.  She notes some tightness in her chest associated with this.  She notes dyspnea with more extreme activities.  She denies orthopnea, PND.  Her lower extremity edema is unchanged.  She does get lightheaded at times with increased activity.  Wt Readings from Last 3 Encounters:  09/14/11 185 lb (83.915 kg)  10/06/10 178 lb (80.74 kg)  08/16/10 176 lb (79.833 kg)    Labs obtained at the cancer center in [place] yesterday: Hemoglobin 12.8, BUN 32, creatinine 1.61  Past Medical History  Diagnosis Date  . Hypertension   . Hyperlipidemia   . Hypothyroid   . Atrial fibrillation     Echocardiogram 5/12: normal LV function, moderate LAE,  mild RAE, mild AI and MR and mild-to-moderate TR.  Myoview 5/ 12 showed an ejection fraction of 73% and normal perfusion.   Patient underwent elective DCCV on 08/26/10    Current Outpatient Prescriptions  Medication Sig Dispense Refill  . calcium carbonate (OS-CAL) 600 MG TABS Take 600 mg by mouth 2 (two) times daily with a meal.        . celecoxib (CELEBREX) 200 MG capsule Take 200 mg by mouth daily.        . dabigatran (PRADAXA) 150 MG CAPS Take 150 mg by mouth every 12 (twelve) hours.        Marland Kitchen diltiazem (CARDIZEM) 90 MG tablet Take 90 mg by mouth 3 (three) times daily.        Marland Kitchen Fluvastatin Sodium (LESCOL PO) Take 30 mg by mouth daily.        Marland Kitchen levothyroxine (SYNTHROID, LEVOTHROID) 150 MCG tablet Take 150 mcg by mouth daily.        . metoprolol (LOPRESSOR) 50 MG tablet Take 50 mg by mouth 3 (three) times daily.        . Multiple Vitamin (MULTIVITAMIN) tablet Take 1 tablet by mouth daily.        Marland Kitchen omeprazole (PRILOSEC) 20 MG capsule Take 20 mg by mouth daily.        . pantoprazole (PROTONIX) 20 MG tablet Take 20 mg by mouth 2 (  two) times daily.        . potassium chloride (KLOR-CON) 10 MEQ CR tablet Take 10 mEq by mouth daily.        Marland Kitchen torsemide (DEMADEX) 20 MG tablet Take 20 mg by mouth 2 (two) times daily.        Marland Kitchen venlafaxine (EFFEXOR-XR) 75 MG 24 hr capsule Take 75 mg by mouth daily.          Allergies: No Known Allergies  History  Substance Use Topics  . Smoking status: Never Smoker   . Smokeless tobacco: Not on file  . Alcohol Use: No     ROS:  Please see the history of present illness.   She has bilateral leg pain.  She has apparently had ABIs performed in the past. All other systems reviewed and negative.   PHYSICAL EXAM: VS:  BP 102/58  Pulse 81  Ht 5\' 10"  (1.778 m)  Wt 185 lb (83.915 kg)  BMI 26.54 kg/m2 Well nourished, well developed, in no acute distress HEENT: normal Neck: no JVD Endocrine: No thyromegaly Cardiac:  normal S1, S2; Irregularly irregular rhythm; no  murmur Lungs:  clear to auscultation bilaterally, no wheezing, rhonchi or rales Abd: soft, nontender, no hepatomegaly Ext: 1-2+ bilateral LE edema;  Boot noted on right foot Skin: warm and dry Neuro:  CNs 2-12 intact, no focal abnormalities noted  EKG:  Coarse atrial fibrillation, heart rate 81, normal axis, nonspecific ST-T wave changes   ASSESSMENT AND PLAN:  1.  Atrial Fibrillation She remains on Pradaxa. Her heart rate is well controlled. She is taking diltiazem 3 times a day as well as Lopressor 3 times a day.  To simplify her medications, I have changed her to Cardizem CD 240 mg a day and metoprolol 100 mg b.i.d. I discussed her case with Dr. Lovena Le (DOD).  He prefers that she followup with Dr. Stanford Breed to discuss further medication adjustments (i.e. Antiarrhythmics). I will have her followup with Dr. Stanford Breed.  Of note, the patient lives in [place] and would prefer to see someone in our [place] office as it is closer to her home.  She is questioning whether or not she can see Dr. Caryl Comes.  I will discuss this further with Dr. Stanford Breed.  2.  Renal Insufficiency She is on Demadex for lower extremity edema.  Her primary care physician is adjusting her diuretics currently.  We will need to keep an eye on her renal function as she is on Pradaxa.  I have asked her to followup with her PCP regarding diuretic dose adjustments.  3.  Hypertension Controlled    Signed, Richardson Dopp, PA-C  1:54 PM 09/14/2011

## 2011-09-16 ENCOUNTER — Ambulatory Visit: Payer: Self-pay | Admitting: Oncology

## 2011-10-04 ENCOUNTER — Ambulatory Visit: Payer: Self-pay | Admitting: Internal Medicine

## 2011-10-11 ENCOUNTER — Ambulatory Visit: Payer: Self-pay | Admitting: Internal Medicine

## 2011-10-18 ENCOUNTER — Encounter: Payer: Self-pay | Admitting: Cardiology

## 2011-10-18 ENCOUNTER — Ambulatory Visit (INDEPENDENT_AMBULATORY_CARE_PROVIDER_SITE_OTHER): Payer: Medicare Other | Admitting: Cardiology

## 2011-10-18 VITALS — BP 122/68 | HR 86 | Ht 70.0 in | Wt 184.4 lb

## 2011-10-18 DIAGNOSIS — I10 Essential (primary) hypertension: Secondary | ICD-10-CM

## 2011-10-18 DIAGNOSIS — I4891 Unspecified atrial fibrillation: Secondary | ICD-10-CM

## 2011-10-18 DIAGNOSIS — N289 Disorder of kidney and ureter, unspecified: Secondary | ICD-10-CM

## 2011-10-18 LAB — BASIC METABOLIC PANEL
BUN: 18 mg/dL (ref 6–23)
Chloride: 106 mEq/L (ref 96–112)
Creatinine, Ser: 1 mg/dL (ref 0.4–1.2)
Glucose, Bld: 94 mg/dL (ref 70–99)
Potassium: 4 mEq/L (ref 3.5–5.1)

## 2011-10-18 MED ORDER — AMIODARONE HCL 200 MG PO TABS: ORAL_TABLET | ORAL | Status: DC

## 2011-10-18 NOTE — Patient Instructions (Addendum)
Your physician recommends that you schedule a follow-up appointment in: 3 WEEKS WITH DR CRENSHAW  STOP ASPIRIN  START AMIODARONE 200 MG TAKE TWO TABLETS ONCE DAILY  Your physician recommends that you HAVE LAB WORK TODAY

## 2011-10-18 NOTE — Assessment & Plan Note (Signed)
Patient has developed recurrent atrial fibrillation. She is symptomatic with increased dyspnea and pedal edema. Plan to continue pradaxa and discontinue aspirin. Check renal function as we may need to discontinue pradaxa and treated with Coumadin in the future if her GFR is less than 30. I think we need to reestablish sinus rhythm as she is extremely symptomatic. Begin amiodarone 400 mg daily. She will return in 2-3 weeks to make sure that heart rate is stable and she is not becoming bradycardic. Cardiovert 4 weeks from now once amiodarone loaded. She will need followup thyroid functions, liver functions and chest x-ray in the future.

## 2011-10-18 NOTE — Assessment & Plan Note (Signed)
Blood pressure controlled. Continue present medications. 

## 2011-10-18 NOTE — Progress Notes (Signed)
HPI: Pleasant female I initially saw in April of 2012 for evaluation of atrial fibrillation. Echocardiogram in May 2012 showed normal LV function, moderate left atrial enlargement, mild right atrial enlargement, mild aortic and mitral regurgitation and mild-to-moderate tricuspid regurgitation. Myoview in May of 2012 showed an ejection fraction of 73% and normal perfusion. Patient underwent elective DCCV on 08/26/10. Patient recently seen by Richardson Dopp for recurrent atrial fibrillation and is scheduled for followup with me today. Since she has developed recurrent atrial fibrillation she has noticed increased fatigue, dyspnea on exertion and pedal edema. No exertional chest pain, bleeding or syncope.   Current Outpatient Prescriptions  Medication Sig Dispense Refill  . aspirin 81 MG tablet Take 81 mg by mouth daily.      . calcium carbonate (OS-CAL) 600 MG TABS Take 600 mg by mouth 2 (two) times daily with a meal.        . dabigatran (PRADAXA) 150 MG CAPS Take 150 mg by mouth every 12 (twelve) hours.        Marland Kitchen diltiazem (CARDIZEM CD) 240 MG 24 hr capsule Take 1 capsule (240 mg total) by mouth daily.  30 capsule  11  . levothyroxine (SYNTHROID, LEVOTHROID) 150 MCG tablet Take 150 mcg by mouth daily.        . meloxicam (MOBIC) 15 MG tablet Take 15 mg by mouth daily.       . metoprolol (LOPRESSOR) 100 MG tablet Take 1 tablet (100 mg total) by mouth 2 (two) times daily.  60 tablet  11  . Multiple Vitamin (MULTIVITAMIN) tablet Take 1 tablet by mouth daily.        Marland Kitchen omeprazole (PRILOSEC) 20 MG capsule Take 20 mg by mouth daily.        . potassium chloride (KLOR-CON) 10 MEQ CR tablet Take 10 mEq by mouth daily.        . pravastatin (PRAVACHOL) 40 MG tablet Take 40 mg by mouth daily.       Marland Kitchen torsemide (DEMADEX) 20 MG tablet Take 20 mg by mouth daily.       Marland Kitchen venlafaxine (EFFEXOR-XR) 75 MG 24 hr capsule Take 75 mg by mouth daily.           Past Medical History  Diagnosis Date  . Hypertension   .  Hyperlipidemia   . Hypothyroid   . Atrial fibrillation     Echocardiogram 5/12: normal LV function, moderate LAE, mild RAE, mild AI and MR and mild-to-moderate TR.  Myoview 5/ 12 showed an ejection fraction of 73% and normal perfusion.   Patient underwent elective DCCV on 08/26/10    Past Surgical History  Procedure Date  . Abdominal hysterectomy   . Back surgery   . Total knee arthroplasty   . Tmj arthroplasty   . Tonsillectomy   . Appendectomy     History   Social History  . Marital Status: Married    Spouse Name: N/A    Number of Children: 2  . Years of Education: N/A   Occupational History  . Not on file.   Social History Main Topics  . Smoking status: Never Smoker   . Smokeless tobacco: Not on file  . Alcohol Use: No  . Drug Use: Not on file  . Sexually Active: Not on file   Other Topics Concern  . Not on file   Social History Narrative  . No narrative on file    ROS: worsening pedal edema and ulcer right lower extremity but no  fevers or chills, productive cough, hemoptysis, dysphasia, odynophagia, melena, hematochezia, dysuria, hematuria, rash, seizure activity, orthopnea, PND,  claudication. Remaining systems are negative.  Physical Exam: Well-developed well-nourished in no acute distress.  Skin is warm and dry.  HEENT is normal.  Neck is supple.  Chest is clear to auscultation with normal expansion.  Cardiovascular exam is irregular Abdominal exam nontender or distended. No masses palpated. Extremities show 1-2+ edema left lower extremity. Right lower extremity dressed with ulcer.  neuro grossly intact  ECG atrial fibrillation at a rate of 86. Possible prior anterior infarct.

## 2011-10-18 NOTE — Assessment & Plan Note (Signed)
Check potassium and renal function. 

## 2011-11-07 ENCOUNTER — Encounter: Payer: Self-pay | Admitting: Cardiology

## 2011-11-07 ENCOUNTER — Encounter: Payer: Self-pay | Admitting: *Deleted

## 2011-11-07 ENCOUNTER — Ambulatory Visit (INDEPENDENT_AMBULATORY_CARE_PROVIDER_SITE_OTHER): Payer: Medicare Other | Admitting: Cardiology

## 2011-11-07 VITALS — BP 145/76 | HR 84 | Ht 70.0 in | Wt 178.0 lb

## 2011-11-07 DIAGNOSIS — I4891 Unspecified atrial fibrillation: Secondary | ICD-10-CM

## 2011-11-07 LAB — CBC WITH DIFFERENTIAL/PLATELET
Basophils Absolute: 0.1 10*3/uL (ref 0.0–0.1)
Eosinophils Absolute: 0.3 10*3/uL (ref 0.0–0.7)
Eosinophils Relative: 3.6 % (ref 0.0–5.0)
HCT: 37.6 % (ref 36.0–46.0)
Lymphs Abs: 2 10*3/uL (ref 0.7–4.0)
MCHC: 32.7 g/dL (ref 30.0–36.0)
MCV: 94.7 fl (ref 78.0–100.0)
Monocytes Absolute: 1.1 10*3/uL — ABNORMAL HIGH (ref 0.1–1.0)
Neutrophils Relative %: 54.4 % (ref 43.0–77.0)
Platelets: 229 10*3/uL (ref 150.0–400.0)
RDW: 13.1 % (ref 11.5–14.6)
WBC: 7.5 10*3/uL (ref 4.5–10.5)

## 2011-11-07 MED ORDER — AMIODARONE HCL 200 MG PO TABS: 200.0000 mg | ORAL_TABLET | Freq: Every day | ORAL | Status: DC

## 2011-11-07 NOTE — Assessment & Plan Note (Signed)
Patient remains in atrial fibrillation. She is very symptomatic. Decrease amiodarone to 200 mg by mouth daily. Continue Pradaxa. Proceed with elective cardioversion. Hopefully she will hold sinus rhythm. Plan repeat echocardiogram.

## 2011-11-07 NOTE — Assessment & Plan Note (Signed)
Continue present blood pressure medications. 

## 2011-11-07 NOTE — Progress Notes (Signed)
HPI: Pleasant female I initially saw in April of 2012 for evaluation of atrial fibrillation. Echocardiogram in May 2012 showed normal LV function, moderate left atrial enlargement, mild right atrial enlargement, mild aortic and mitral regurgitation and mild-to-moderate tricuspid regurgitation. Myoview in May of 2012 showed an ejection fraction of 73% and normal perfusion. Patient underwent elective DCCV on 08/26/10. Patient developed recurrent atrial fibrillation recently and we added amiodarone with intention to proceed with DCCV. Since I last saw her, she does have dyspnea on exertion but no orthopnea or PND. She continues to have problems with pedal edema. No exertional chest pain. No syncope.   Current Outpatient Prescriptions  Medication Sig Dispense Refill  . amiodarone (PACERONE) 200 MG tablet TWO TABLETS ONCE DAILY  60 tablet  12  . calcium carbonate (OS-CAL) 600 MG TABS Take 600 mg by mouth 2 (two) times daily with a meal.        . dabigatran (PRADAXA) 150 MG CAPS Take 150 mg by mouth every 12 (twelve) hours.        Marland Kitchen diltiazem (CARDIZEM CD) 240 MG 24 hr capsule Take 1 capsule (240 mg total) by mouth daily.  30 capsule  11  . levothyroxine (SYNTHROID, LEVOTHROID) 150 MCG tablet Take 150 mcg by mouth daily.        . meloxicam (MOBIC) 15 MG tablet Take 15 mg by mouth daily.       . metoprolol (LOPRESSOR) 100 MG tablet Take 1 tablet (100 mg total) by mouth 2 (two) times daily.  60 tablet  11  . Multiple Vitamin (MULTIVITAMIN) tablet Take 1 tablet by mouth daily.        Marland Kitchen omeprazole (PRILOSEC) 20 MG capsule Take 20 mg by mouth daily.        . potassium chloride (KLOR-CON) 10 MEQ CR tablet Take 10 mEq by mouth daily.        . pravastatin (PRAVACHOL) 40 MG tablet Take 40 mg by mouth daily.       Marland Kitchen torsemide (DEMADEX) 20 MG tablet Take 20 mg by mouth daily.       Marland Kitchen venlafaxine (EFFEXOR-XR) 75 MG 24 hr capsule Take 75 mg by mouth daily.           Past Medical History  Diagnosis Date  .  Hypertension   . Hyperlipidemia   . Hypothyroid   . Atrial fibrillation     Echocardiogram 5/12: normal LV function, moderate LAE, mild RAE, mild AI and MR and mild-to-moderate TR.  Myoview 5/ 12 showed an ejection fraction of 73% and normal perfusion.   Patient underwent elective DCCV on 08/26/10    Past Surgical History  Procedure Date  . Abdominal hysterectomy   . Back surgery   . Total knee arthroplasty   . Tmj arthroplasty   . Tonsillectomy   . Appendectomy     History   Social History  . Marital Status: Married    Spouse Name: N/A    Number of Children: 2  . Years of Education: N/A   Occupational History  . Not on file.   Social History Main Topics  . Smoking status: Never Smoker   . Smokeless tobacco: Not on file  . Alcohol Use: No  . Drug Use: Not on file  . Sexually Active: Not on file   Other Topics Concern  . Not on file   Social History Narrative  . No narrative on file    ROS: no fevers or chills, productive cough, hemoptysis,  dysphasia, odynophagia, melena, hematochezia, dysuria, hematuria, rash, seizure activity, orthopnea, PND, pedal edema, claudication. Remaining systems are negative.  Physical Exam: Well-developed well-nourished in no acute distress.  Skin is warm and dry.  HEENT is normal.  Neck is supple. Chest is clear to auscultation with normal expansion.  Cardiovascular exam is irregular Abdominal exam nontender or distended. No masses palpated. Extremities show 2+ edema. neuro grossly intact  ECG atrial fibrillation at a rate of 84. Nonspecific T-wave changes.

## 2011-11-07 NOTE — Patient Instructions (Addendum)
Your physician recommends that you schedule a follow-up appointment in: [place] has recommended that you have a Cardioversion (DCCV). Electrical Cardioversion uses a jolt of electricity to your heart either through paddles or wired patches attached to your chest. This is a controlled, usually prescheduled, procedure. Defibrillation is done under light anesthesia in the hospital, and you usually go home the day of the procedure. This is done to get your heart back into a normal rhythm. You are not awake for the procedure. Please see the instruction sheet given to you today.   Your physician recommends that you HAVE LAB WORK TODAY  DECREASE AMIODARONE TO 200 MG ONCE DAILY

## 2011-11-08 ENCOUNTER — Encounter (HOSPITAL_COMMUNITY): Payer: Self-pay | Admitting: Pharmacy Technician

## 2011-11-09 ENCOUNTER — Ambulatory Visit: Payer: Medicare Other | Admitting: Cardiology

## 2011-11-10 ENCOUNTER — Encounter (HOSPITAL_COMMUNITY): Payer: Self-pay | Admitting: Anesthesiology

## 2011-11-10 ENCOUNTER — Encounter (HOSPITAL_COMMUNITY)
Admission: RE | Disposition: A | Discharge status: Home / Self Care | Payer: Self-pay | Source: Ambulatory Visit | Attending: Cardiology

## 2011-11-10 ENCOUNTER — Ambulatory Visit (HOSPITAL_COMMUNITY)
Admission: RE | Admit: 2011-11-10 | Discharge: 2011-11-10 | Disposition: A | Discharge status: Home / Self Care | Payer: Medicare Other | Source: Ambulatory Visit | Attending: Cardiology | Admitting: Cardiology

## 2011-11-10 ENCOUNTER — Ambulatory Visit (HOSPITAL_COMMUNITY): Payer: Medicare Other | Admitting: Anesthesiology

## 2011-11-10 DIAGNOSIS — I4891 Unspecified atrial fibrillation: Secondary | ICD-10-CM | POA: Insufficient documentation

## 2011-11-10 HISTORY — PX: CARDIOVERSION: SHX1299

## 2011-11-10 SURGERY — CARDIOVERSION
Anesthesia: General | Wound class: Clean

## 2011-11-10 MED ORDER — SODIUM CHLORIDE 0.9 % IV SOLN
INTRAVENOUS | Status: DC | PRN
  Administered 2011-11-10: 13:00:00 via INTRAVENOUS

## 2011-11-10 MED ORDER — LIDOCAINE HCL (CARDIAC) 20 MG/ML IV SOLN
INTRAVENOUS | Status: DC | PRN
  Administered 2011-11-10: 40 mg via INTRAVENOUS

## 2011-11-10 MED ORDER — SODIUM CHLORIDE 0.9 % IV SOLN: 250.0000 mL | INTRAVENOUS | Status: DC

## 2011-11-10 MED ORDER — FENTANYL CITRATE 0.05 MG/ML IJ SOLN: 50.0000 ug | INTRAMUSCULAR | Status: DC | PRN

## 2011-11-10 MED ORDER — PROPOFOL 10 MG/ML IV BOLUS
INTRAVENOUS | Status: DC | PRN
  Administered 2011-11-10: 80 mg via INTRAVENOUS

## 2011-11-10 MED ORDER — HYDROCORTISONE 1 % EX CREA: 1.0000 "application " | TOPICAL_CREAM | Freq: Three times a day (TID) | CUTANEOUS | Status: DC | PRN

## 2011-11-10 MED ORDER — MIDAZOLAM HCL 2 MG/2ML IJ SOLN: 1.0000 mg | INTRAMUSCULAR | Status: DC | PRN

## 2011-11-10 MED ORDER — SODIUM CHLORIDE 0.9 % IJ SOLN: 3.0000 mL | Freq: Two times a day (BID) | INTRAMUSCULAR | Status: DC

## 2011-11-10 MED ORDER — SODIUM CHLORIDE 0.9 % IJ SOLN: 3.0000 mL | INTRAMUSCULAR | Status: DC | PRN

## 2011-11-10 NOTE — Procedures (Signed)
Electrical Cardioversion Procedure Note Damaya Keim Soh [id number] [birth year]  Procedure: Electrical Cardioversion Indications:  Atrial Fibrillation  Procedure Details Consent: Risks of procedure as well as the alternatives and risks of each were explained to the (patient/caregiver).  Consent for procedure obtained. Time Out: Verified patient identification, verified procedure, site/side was marked, verified correct patient position, special equipment/implants available, medications/allergies/relevent history reviewed, required imaging and test results available.  Performed  Patient placed on cardiac monitor, pulse oximetry, supplemental oxygen as necessary.  Sedation given: Diprovan 80 mg IV administered by anesthesia Pacer pads placed anterior and posterior chest.  Cardioverted 1 time(s).  Cardioverted at 120J.  Evaluation Findings: Post procedure EKG shows: NSR Complications: None Patient did tolerate procedure well.   Kirk Ruths 11/10/2011, 1:15 PM

## 2011-11-10 NOTE — Anesthesia Postprocedure Evaluation (Signed)
  Anesthesia Post-op Note  Patient: [name]  Procedure(s) Performed: Procedure(s) (LRB): CARDIOVERSION (N/A)  Patient Location: PACU and Short Stay  Anesthesia Type: General  Level of Consciousness: awake  Airway and Oxygen Therapy: Patient Spontanous Breathing  Post-op Pain: none  Post-op Assessment: Post-op Vital signs reviewed, Patient's Cardiovascular Status Stable, Respiratory Function Stable, Patent Airway, No signs of Nausea or vomiting and Pain level controlled  Post-op Vital Signs: Reviewed and stable  Complications: No apparent anesthesia complications

## 2011-11-10 NOTE — Anesthesia Preprocedure Evaluation (Addendum)
Anesthesia Evaluation  Patient identified by MRN, date of birth, ID band Patient awake    Reviewed: Allergy & Precautions, H&P , NPO status , Patient's Chart, lab work & pertinent test results  History of Anesthesia Complications (+) AWARENESS UNDER ANESTHESIA  Airway Mallampati: I TM Distance: >3 FB Neck ROM: Full    Dental  (+) Edentulous Lower, Partial Lower, Implants and Dental Advisory Given   Pulmonary neg pulmonary ROS,    Pulmonary exam normal       Cardiovascular hypertension, Pt. on medications and Pt. on home beta blockers + DOE + dysrhythmias Atrial Fibrillation Rhythm:Irregular Rate:Normal  Echocardiogram in May 2012 showed normal LV function, moderate left atrial enlargement, mild right atrial enlargement, mild aortic and mitral regurgitation and mild-to-moderate tricuspid regurgitation. Myoview in May of 2012 showed an ejection fraction of 73% and normal perfusion   Neuro/Psych negative neurological ROS  negative psych ROS   GI/Hepatic negative GI ROS, Neg liver ROS,   Endo/Other  Hypothyroidism   Renal/GU Renal InsufficiencyRenal disease     Musculoskeletal negative musculoskeletal ROS (+)   Abdominal   Peds  Hematology  (+) Blood dyscrasia (xalralto), ,   Anesthesia Other Findings   Reproductive/Obstetrics                       Anesthesia Physical Anesthesia Plan  ASA: III  Anesthesia Plan: General   Post-op Pain Management:    Induction: Intravenous  Airway Management Planned: Mask  Additional Equipment:   Intra-op Plan:   Post-operative Plan:   Informed Consent: I have reviewed the patients History and Physical, chart, labs and discussed the procedure including the risks, benefits and alternatives for the proposed anesthesia with the patient or authorized representative who has indicated his/her understanding and acceptance.     Plan Discussed with: CRNA and  Surgeon  Anesthesia Plan Comments:         Anesthesia Quick Evaluation

## 2011-11-10 NOTE — Transfer of Care (Signed)
Immediate Anesthesia Transfer of Care Note  Patient: [name]  Procedure(s) Performed: Procedure(s) (LRB): CARDIOVERSION (N/A)  Patient Location: Short Stay  Anesthesia Type: General  Level of Consciousness: awake, alert , oriented and patient cooperative  Airway & Oxygen Therapy: Patient Spontanous Breathing and Patient connected to nasal cannula oxygen  Post-op Assessment: Report given to PACU RN and Post -op Vital signs reviewed and stable  Post vital signs: Reviewed and stable  Complications: No apparent anesthesia complications

## 2011-11-10 NOTE — Preoperative (Signed)
Beta Blockers   Reason not to administer Beta Blockers:Metoprolol 11/10/11 0930

## 2011-11-13 ENCOUNTER — Encounter (HOSPITAL_COMMUNITY): Payer: Self-pay | Admitting: Cardiology

## 2011-11-21 ENCOUNTER — Other Ambulatory Visit: Payer: Self-pay | Admitting: *Deleted

## 2011-11-21 ENCOUNTER — Other Ambulatory Visit: Payer: Self-pay | Admitting: Cardiology

## 2011-11-21 MED ORDER — DILTIAZEM HCL ER COATED BEADS 240 MG PO CP24: 240.0000 mg | ORAL_CAPSULE | Freq: Every day | ORAL | Status: DC

## 2011-11-21 NOTE — Telephone Encounter (Signed)
[facility] in [place]

## 2011-11-27 ENCOUNTER — Telehealth: Payer: Self-pay | Admitting: Cardiology

## 2011-11-27 NOTE — Telephone Encounter (Signed)
[place] calling stating ms Renwick called them and wants to change her med from 240mg  to 120mg --i called pt to find out reason she wants to change and LM --I don't know which drug they are referring to--i presume it is cardiazem, but again they don't name med in message or which [facility] to call--i have LM with pt to call me back, advising we can't change med without order from dr Stanford Breed and i will figure out which [facility] and call them and let them know we can't change without order--nt

## 2011-11-27 NOTE — Telephone Encounter (Signed)
[facility] called and said that patient wants to change from 240 mg to 120 mg Please call back

## 2011-11-28 ENCOUNTER — Telehealth: Payer: Self-pay | Admitting: *Deleted

## 2011-11-28 MED ORDER — DILTIAZEM HCL ER COATED BEADS 120 MG PO CP24: 120.0000 mg | ORAL_CAPSULE | Freq: Every day | ORAL | Status: DC

## 2011-11-28 NOTE — Telephone Encounter (Signed)
Pt returning call from 8/12--advised pt we cannot change meds without drs order--pt states the 240mg  diltiazem is making her very dizzy and she wants to drop dose down to 120mg --spoke with dr cooper(DOD) and Halford Decamp dropping dose to 120mg  until pt back from vacation --advised she would need to f/u with dr Stanford Breed as i only called in 30 tabs with no refill--pt agrees--diltiazem 120mg 

## 2011-11-28 NOTE — Telephone Encounter (Signed)
con't from previous note--spoke with dr cooper(DOD)  about changing ms Barfoot's diltiazem from 240 mg 24 hour tab to 120mg  tab due to extreme dizziness --dr cooper stated was OK to change for now --advised pt i called in 30 tabs of diltiazem 120mg  24 hour  With no refills --made pt aware she needs to f/u with dr Stanford Breed  When she gets back from vacation to get more medication, at the dose dr Stanford Breed wants her on--pt agrees

## 2011-11-28 NOTE — Telephone Encounter (Signed)
OK to continue cardizem at 120 mg po daily. Kirk Ruths

## 2011-11-29 NOTE — Telephone Encounter (Signed)
Advised patient and she will continue the Cardizem 120 mg daily and call back with update on how she is feeling.  If continues to do well will send further refills to [facility] (patient thought that would be best instead of sending refills now).

## 2011-12-06 ENCOUNTER — Telehealth: Payer: Self-pay | Admitting: Cardiology

## 2011-12-06 NOTE — Telephone Encounter (Signed)
New problem:  Patient calling wants to know can she have an injection in her back.

## 2011-12-06 NOTE — Telephone Encounter (Signed)
Spoke with pt, she is getting a cortisone injection in her back and needs to know about stopping her pradaxa. Will forward for dr Stanford Breed review

## 2011-12-07 NOTE — Telephone Encounter (Signed)
OK to hold 2 days prior to procedure and resume 2 days after. [name]

## 2011-12-07 NOTE — Telephone Encounter (Signed)
Spoke with pt, Aware of dr crenshaw's recommendations.  °

## 2011-12-21 ENCOUNTER — Ambulatory Visit: Payer: Self-pay | Admitting: Surgery

## 2011-12-21 DIAGNOSIS — D0511 Intraductal carcinoma in situ of right breast: Secondary | ICD-10-CM

## 2011-12-21 HISTORY — DX: Intraductal carcinoma in situ of right breast: D05.11

## 2011-12-22 LAB — PATHOLOGY REPORT

## 2011-12-26 ENCOUNTER — Telehealth: Payer: Self-pay | Admitting: Cardiology

## 2011-12-26 NOTE — Telephone Encounter (Signed)
New problem:  Surgery on Monday 9/16. Cardiac clearance to come off pradaxa 2 days prior.

## 2011-12-27 ENCOUNTER — Ambulatory Visit: Payer: Self-pay | Admitting: Surgery

## 2011-12-27 NOTE — Telephone Encounter (Signed)
Ok to hold pradaxa 2 days prior to procedure and resume 4 days after. Kirk Ruths

## 2011-12-27 NOTE — Telephone Encounter (Signed)
Telephone note faxed to the number provided 

## 2011-12-27 NOTE — Telephone Encounter (Signed)
Pt is having a simple mastectomy and needs to hold her pradaxa. Will forward for dr Stanford Breed review

## 2011-12-28 ENCOUNTER — Other Ambulatory Visit: Payer: Self-pay | Admitting: Cardiology

## 2011-12-28 ENCOUNTER — Telehealth: Payer: Self-pay | Admitting: Cardiology

## 2011-12-28 NOTE — Telephone Encounter (Signed)
Note refaxed to the number provided

## 2011-12-28 NOTE — Telephone Encounter (Signed)
New problem:  Cardiac clearance need to stop praxada . Surgery on 9/16.

## 2012-01-01 ENCOUNTER — Ambulatory Visit: Payer: Self-pay | Admitting: Surgery

## 2012-01-03 LAB — PLATELET COUNT: Platelet: 258 10*3/uL (ref 150–440)

## 2012-01-08 ENCOUNTER — Telehealth: Payer: Self-pay | Admitting: Cardiology

## 2012-01-08 NOTE — Telephone Encounter (Signed)
Pt to have back injection this week, can she stop pradaxa for two days prior, pls call [phone]

## 2012-01-08 NOTE — Telephone Encounter (Signed)
Ok to hold pradaxa 2 days before procedure and resume 2 days after [name]

## 2012-01-08 NOTE — Telephone Encounter (Signed)
Pt informed Dr/Nurse out today and will get a call back tomorrow, pt agreed to plan,

## 2012-01-09 NOTE — Telephone Encounter (Signed)
Follow-up:    Patient's husband called to follow-up on her call from yesterday.  Please call back.

## 2012-01-09 NOTE — Telephone Encounter (Signed)
Spoke with pt husband, Aware of dr crenshaw's recommendations.  

## 2012-01-11 ENCOUNTER — Encounter: Payer: Self-pay | Admitting: Cardiology

## 2012-01-11 ENCOUNTER — Ambulatory Visit (INDEPENDENT_AMBULATORY_CARE_PROVIDER_SITE_OTHER)
Admission: RE | Admit: 2012-01-11 | Discharge: 2012-01-11 | Disposition: A | Discharge status: Home / Self Care | Payer: Medicare Other | Source: Ambulatory Visit | Attending: Cardiology | Admitting: Cardiology

## 2012-01-11 ENCOUNTER — Ambulatory Visit (INDEPENDENT_AMBULATORY_CARE_PROVIDER_SITE_OTHER): Payer: Medicare Other | Admitting: Cardiology

## 2012-01-11 VITALS — BP 140/60 | HR 52 | Ht 70.0 in | Wt 165.0 lb

## 2012-01-11 DIAGNOSIS — I4891 Unspecified atrial fibrillation: Secondary | ICD-10-CM

## 2012-01-11 LAB — PATHOLOGY REPORT

## 2012-01-11 LAB — BASIC METABOLIC PANEL
BUN: 29 mg/dL — ABNORMAL HIGH (ref 6–23)
Calcium: 9 mg/dL (ref 8.4–10.5)
Creatinine, Ser: 1.7 mg/dL — ABNORMAL HIGH (ref 0.4–1.2)
GFR: 30.57 mL/min — ABNORMAL LOW (ref >60.00)
Potassium: 2.6 mEq/L — CL (ref 3.5–5.1)

## 2012-01-11 LAB — TSH: TSH: 1.1 u[IU]/mL (ref 0.35–5.50)

## 2012-01-11 MED ORDER — AMLODIPINE BESYLATE 5 MG PO TABS: 5.0000 mg | ORAL_TABLET | Freq: Every day | ORAL | Status: DC

## 2012-01-11 MED ORDER — TORSEMIDE 20 MG PO TABS: 20.0000 mg | ORAL_TABLET | Freq: Every day | ORAL | Status: DC | PRN

## 2012-01-11 NOTE — Patient Instructions (Signed)
Your physician recommends that you schedule a follow-up appointment in: [place]  STOP DILTIAZEM  START AMLODIPINE 5 MG ONCE DAILY  CHANGE DEMADEX TO ONCE DAILY AS NEEDED FOR SWELLING  CHANGE POTASSIUM TO ONCE DAILY AS NEEDED-TAKE WITH DEMADEX  Your physician recommends that you HAVE LAB WORK TODAY  A chest x-ray takes a picture of the organs and structures inside the chest, including the heart, lungs, and blood vessels. This test can show several things, including, whether the heart is enlarges; whether fluid is building up in the lungs; and whether pacemaker / defibrillator leads are still in place.AT [place]  Your physician has requested that you have an echocardiogram. Echocardiography is a painless test that uses sound waves to create images of your heart. It provides your doctor with information about the size and shape of your heart and how well your heart's chambers and valves are working. This procedure takes approximately one hour. There are no restrictions for this procedure.SCHEDULE SAMEDAY AS FOLLOW UP APPT

## 2012-01-11 NOTE — Assessment & Plan Note (Signed)
Patient remains in sinus rhythm today. Continue amiodarone. Check TSH, liver functions and chest x-ray. Her QT is prolonged the risk of torsade's his low with amiodarone. She also apparently had atrial fibrillation following recent mastectomy and I will therefore not decreased dose. Her heart rate is somewhat slow. Continue metoprolol but discontinue Cardizem. Continue pradaxa but she is planning to change to Coumadin because of expense. This will be monitored by her primary care physician in [place]. Her symptoms are markedly improved in sinus rhythm. Her edema has resolved. Change to Demadex and potassium to as needed daily. Watch for increasing dyspnea or edema. Plan to repeat echocardiogram now that she is in sinus rhythm.

## 2012-01-11 NOTE — Progress Notes (Signed)
HPI: Pleasant female I initially saw in April of 2012 for evaluation of atrial fibrillation. Echocardiogram in May 2012 showed normal LV function, moderate left atrial enlargement, mild right atrial enlargement, mild aortic and mitral regurgitation and mild-to-moderate tricuspid regurgitation. Myoview in May of 2012 showed an ejection fraction of 73% and normal perfusion. Patient underwent elective DCCV on 08/26/10. Patient developed recurrent atrial fibrillation recently and we added amiodarone. Patient had successful cardioversion on 11/10/2011. Since her cardioversion, she feels much better. She has some dyspnea on exertion but improved. No orthopnea or PND. Her pedal edema has completely resolved. No palpitations or syncope. No chest pain. She is recovering from her recent mastectomy for breast cancer.   Current Outpatient Prescriptions  Medication Sig Dispense Refill  . calcium-vitamin D (OSCAL WITH D) 500-200 MG-UNIT per tablet Take 1 tablet by mouth daily.      . celecoxib (CELEBREX) 200 MG capsule Take 200 mg by mouth daily.      . dabigatran (PRADAXA) 150 MG CAPS Take 150 mg by mouth every 12 (twelve) hours.        Marland Kitchen diltiazem (CARDIZEM CD) 120 MG 24 hr capsule TAKE ONE CAPSULE  BY MOUTH EVERY DAY  30 capsule  12  . levothyroxine (SYNTHROID, LEVOTHROID) 150 MCG tablet Take 150 mcg by mouth daily.        . metoprolol (LOPRESSOR) 50 MG tablet Take 50 mg by mouth 2 (two) times daily.      . Multiple Vitamin (MULTIVITAMIN) tablet Take 1 tablet by mouth daily.        Marland Kitchen omeprazole (PRILOSEC) 20 MG capsule Take 20 mg by mouth 2 (two) times daily.       . potassium chloride (KLOR-CON) 10 MEQ CR tablet Take 10 mEq by mouth daily.        . pravastatin (PRAVACHOL) 40 MG tablet Take 40 mg by mouth at bedtime.       . torsemide (DEMADEX) 20 MG tablet Take 20 mg by mouth 2 (two) times daily.       Marland Kitchen venlafaxine (EFFEXOR-XR) 75 MG 24 hr capsule Take 75 mg by mouth daily.           Past Medical  History  Diagnosis Date  . Hypertension   . Hyperlipidemia   . Hypothyroid   . Atrial fibrillation     Echocardiogram 5/12: normal LV function, moderate LAE, mild RAE, mild AI and MR and mild-to-moderate TR.  Myoview 5/ 12 showed an ejection fraction of 73% and normal perfusion.   Patient underwent elective DCCV on 08/26/10    Past Surgical History  Procedure Date  . Abdominal hysterectomy   . Back surgery   . Total knee arthroplasty   . Tmj arthroplasty   . Tonsillectomy   . Appendectomy   . Cardioversion 11/10/2011    Procedure: CARDIOVERSION;  Surgeon: Lelon Perla, MD;  Location: [facility] OR;  Service: Cardiovascular;  Laterality: N/A;    History   Social History  . Marital Status: Married    Spouse Name: N/A    Number of Children: 2  . Years of Education: N/A   Occupational History  . Not on file.   Social History Main Topics  . Smoking status: Never Smoker   . Smokeless tobacco: Not on file  . Alcohol Use: No  . Drug Use: Not on file  . Sexually Active: Not on file   Other Topics Concern  . Not on file   Social History Narrative  .  No narrative on file    ROS: no fevers or chills, productive cough, hemoptysis, dysphasia, odynophagia, melena, hematochezia, dysuria, hematuria, rash, seizure activity, orthopnea, PND, pedal edema, claudication. Remaining systems are negative.  Physical Exam: Well-developed well-nourished in no acute distress.  Skin is warm and dry.  HEENT is normal.  Neck is supple.  Chest is clear to auscultation with normal expansion. S/P mastectomy right Cardiovascular exam is regular rate and rhythm.  Abdominal exam nontender or distended. No masses palpated. Extremities show no edema. neuro grossly intact  ECG sinus bradycardia at a rate of 52. Normal axis. Prolonged QT interval. Nonspecific T-wave changes.

## 2012-01-11 NOTE — Assessment & Plan Note (Signed)
Patient is mildly bradycardic. Discontinue Cardizem and add Norvasc 5 mg daily. Check potassium and renal function.

## 2012-01-15 ENCOUNTER — Ambulatory Visit: Payer: Self-pay | Admitting: Oncology

## 2012-01-17 ENCOUNTER — Ambulatory Visit: Payer: Self-pay | Admitting: Oncology

## 2012-02-16 ENCOUNTER — Ambulatory Visit: Payer: Self-pay | Admitting: Oncology

## 2012-03-05 ENCOUNTER — Ambulatory Visit (HOSPITAL_COMMUNITY): Payer: Medicare Other | Attending: Cardiology | Admitting: Radiology

## 2012-03-05 ENCOUNTER — Ambulatory Visit (INDEPENDENT_AMBULATORY_CARE_PROVIDER_SITE_OTHER): Payer: Medicare Other | Admitting: Cardiology

## 2012-03-05 ENCOUNTER — Encounter: Payer: Self-pay | Admitting: Cardiology

## 2012-03-05 VITALS — BP 173/74 | HR 56 | Wt 166.0 lb

## 2012-03-05 DIAGNOSIS — R0602 Shortness of breath: Secondary | ICD-10-CM

## 2012-03-05 DIAGNOSIS — I10 Essential (primary) hypertension: Secondary | ICD-10-CM

## 2012-03-05 DIAGNOSIS — I4891 Unspecified atrial fibrillation: Secondary | ICD-10-CM

## 2012-03-05 LAB — BASIC METABOLIC PANEL
BUN: 22 mg/dL (ref 6–23)
Chloride: 104 mEq/L (ref 96–112)
Creatinine, Ser: 1.3 mg/dL — ABNORMAL HIGH (ref 0.4–1.2)
Glucose, Bld: 86 mg/dL (ref 70–99)

## 2012-03-05 LAB — HEPATIC FUNCTION PANEL
AST: 23 U/L (ref 0–37)
Albumin: 3.7 g/dL (ref 3.5–5.2)
Total Protein: 6.8 g/dL (ref 6.0–8.3)

## 2012-03-05 LAB — BRAIN NATRIURETIC PEPTIDE: Pro B Natriuretic peptide (BNP): 488 pg/mL — ABNORMAL HIGH (ref 0.0–100.0)

## 2012-03-05 NOTE — Assessment & Plan Note (Addendum)
Patient remains in sinus rhythm on examination. Continue Coumadin. We have provided the names of the new anticoagulants. She will check with her insurance company to see if they are covered. Recent TSH and chest x-ray okay. Check liver functions. Await final echo results.

## 2012-03-05 NOTE — Progress Notes (Signed)
Echocardiogram performed.  

## 2012-03-05 NOTE — Patient Instructions (Addendum)
Your physician wants you to follow-up in: [place] will receive a reminder letter in the mail two months in advance. If you don't receive a letter, please call our office to schedule the follow-up appointment.   XARELTO = PRADAXA = ELIQUIS TO REPLACE COUMADIN

## 2012-03-05 NOTE — Progress Notes (Signed)
HPI: Pleasant female I initially saw in April of 2012 for evaluation of atrial fibrillation. Echocardiogram in May 2012 showed normal LV function, moderate left atrial enlargement, mild right atrial enlargement, mild aortic and mitral regurgitation and mild-to-moderate tricuspid regurgitation. Myoview in May of 2012 showed an ejection fraction of 73% and normal perfusion. Patient underwent elective DCCV on 08/26/10. Patient developed recurrent atrial fibrillation recently and we added amiodarone. Patient had successful cardioversion on 11/10/2011. Since I last saw her, she has some dyspnea on exertion but improved. No orthopnea or PND. Her pedal edema has improved. No chest pain or syncope. She does have lower extremity weakness and pain.  Current Outpatient Prescriptions  Medication Sig Dispense Refill  . amiodarone (PACERONE) 200 MG tablet Take 200 mg by mouth daily.      Marland Kitchen amLODipine (NORVASC) 5 MG tablet Take 1 tablet (5 mg total) by mouth daily.  30 tablet  11  . anastrozole (ARIMIDEX) 1 MG tablet Take 1 tablet by mouth Daily.      . calcium-vitamin D (OSCAL WITH D) 500-200 MG-UNIT per tablet Take 1 tablet by mouth daily.      . celecoxib (CELEBREX) 200 MG capsule Take 200 mg by mouth daily.      Marland Kitchen doxycycline (VIBRA-TABS) 100 MG tablet As directed      . levothyroxine (SYNTHROID, LEVOTHROID) 150 MCG tablet Take 150 mcg by mouth daily.        . meloxicam (MOBIC) 15 MG tablet Take 1 tablet by mouth Daily.      . metolazone (ZAROXOLYN) 2.5 MG tablet Take 2.5 mg by mouth daily.      . metoprolol (LOPRESSOR) 50 MG tablet Take 50 mg by mouth 2 (two) times daily.      . Multiple Vitamin (MULTIVITAMIN) tablet Take 1 tablet by mouth daily.        . potassium chloride (KLOR-CON) 10 MEQ CR tablet Take 10 mEq by mouth daily.        . pravastatin (PRAVACHOL) 40 MG tablet Take 40 mg by mouth at bedtime.       . torsemide (DEMADEX) 20 MG tablet Take 1 tablet (20 mg total) by mouth daily as needed.      .  venlafaxine (EFFEXOR-XR) 75 MG 24 hr capsule Take 75 mg by mouth daily.        Marland Kitchen warfarin (COUMADIN) 3 MG tablet As directed         Past Medical History  Diagnosis Date  . Hypertension   . Hyperlipidemia   . Hypothyroid   . Atrial fibrillation     Echocardiogram 5/12: normal LV function, moderate LAE, mild RAE, mild AI and MR and mild-to-moderate TR.  Myoview 5/ 12 showed an ejection fraction of 73% and normal perfusion.   Patient underwent elective DCCV on 08/26/10    Past Surgical History  Procedure Date  . Abdominal hysterectomy   . Back surgery   . Total knee arthroplasty   . Tmj arthroplasty   . Tonsillectomy   . Appendectomy   . Cardioversion 11/10/2011    Procedure: CARDIOVERSION;  Surgeon: Lelon Perla, MD;  Location: [facility] OR;  Service: Cardiovascular;  Laterality: N/A;    History   Social History  . Marital Status: Married    Spouse Name: N/A    Number of Children: 2  . Years of Education: N/A   Occupational History  . Not on file.   Social History Main Topics  . Smoking status: Never Smoker   .  Smokeless tobacco: Not on file  . Alcohol Use: No  . Drug Use: Not on file  . Sexually Active: Not on file   Other Topics Concern  . Not on file   Social History Narrative  . No narrative on file    ROS: no fevers or chills, productive cough, hemoptysis, dysphasia, odynophagia, melena, hematochezia, dysuria, hematuria, rash, seizure activity, orthopnea, PND, pedal edema, claudication. Remaining systems are negative.  Physical Exam: Well-developed well-nourished in no acute distress.  Skin is warm and dry.  HEENT is normal.  Neck is supple.  Chest is clear to auscultation with normal expansion.  Cardiovascular exam is regular rate and rhythm.  Abdominal exam nontender or distended. No masses palpated. Extremities show trace ankle edema. neuro grossly intact

## 2012-03-05 NOTE — Assessment & Plan Note (Signed)
Blood pressure controlled. Continue present medications. Her edema has improved and she is now taking her diuretic every other day. Check potassium, renal function and BNP.

## 2012-03-21 ENCOUNTER — Ambulatory Visit: Payer: Self-pay | Admitting: General Practice

## 2012-06-23 ENCOUNTER — Other Ambulatory Visit: Payer: Self-pay | Admitting: Family Medicine

## 2012-06-23 LAB — PROTIME-INR
INR: 4.9
Prothrombin Time: 43.6 secs — ABNORMAL HIGH (ref 11.5–14.7)

## 2012-07-16 ENCOUNTER — Ambulatory Visit (INDEPENDENT_AMBULATORY_CARE_PROVIDER_SITE_OTHER): Payer: Medicare Other | Admitting: Cardiology

## 2012-07-16 ENCOUNTER — Encounter: Payer: Self-pay | Admitting: Cardiology

## 2012-07-16 VITALS — BP 150/70 | HR 58 | Wt 155.0 lb

## 2012-07-16 DIAGNOSIS — I10 Essential (primary) hypertension: Secondary | ICD-10-CM

## 2012-07-16 DIAGNOSIS — I4891 Unspecified atrial fibrillation: Secondary | ICD-10-CM

## 2012-07-16 DIAGNOSIS — N289 Disorder of kidney and ureter, unspecified: Secondary | ICD-10-CM

## 2012-07-16 NOTE — Assessment & Plan Note (Signed)
Continue present blood pressure medications. I am hesitant to increase her medications as she has some orthostatic symptoms.

## 2012-07-16 NOTE — Assessment & Plan Note (Signed)
Patient remains in sinus rhythm.continue amiodarone. Recent TSH and liver functions normal. Check PA and lateral chest x-ray. Continue beta blocker. Continue Coumadin but discontinue aspirin.

## 2012-07-16 NOTE — Patient Instructions (Signed)
Your physician wants you to follow-up in: [place] will receive a reminder letter in the mail two months in advance. If you don't receive a letter, please call our office to schedule the follow-up appointment.   A chest x-ray takes a picture of the organs and structures inside the chest, including the heart, lungs, and blood vessels. This test can show several things, including, whether the heart is enlarges; whether fluid is building up in the lungs; and whether pacemaker / defibrillator leads are still in place. AT [place]

## 2012-07-16 NOTE — Assessment & Plan Note (Signed)
Recent laboratories show worsening renal function. Her Zaroxolyn was discontinued. She continues on Demadex. She is scheduled to see her primary care physician tomorrow. She will most likely require repeat labs at that time. Her Demadex could be decreased further to see if she tolerates pending the results of her lab work.

## 2012-07-16 NOTE — Progress Notes (Signed)
HPI: Pleasant female for fu of atrial fibrillation. I initially saw her in April of 2012 for evaluation of atrial fibrillation. Myoview in May of 2012 showed an ejection fraction of 73% and normal perfusion. Patient underwent elective DCCV on 08/26/10. Patient developed recurrent atrial fibrillation recently and we added amiodarone. Patient had successful cardioversion on 11/10/2011. followup echocardiogram in November of 2013 showed normal LV function, moderate left atrial enlargement, mild aortic and mitral regurgitation and restrictive filling. Note patient had laboratories in March of 2004 that showed a normal TSH. Liver functions were normal. BUN was 45 and creatinine 2.4. Since I last saw her in Nov 2013, has mild dyspnea on exertion but no orthopnea, PND, chest pain, palpitations. Minimal pedal edema. No syncope. Mild dizziness with standing.   Current Outpatient Prescriptions  Medication Sig Dispense Refill  . amiodarone (PACERONE) 200 MG tablet Take 200 mg by mouth daily.      Marland Kitchen anastrozole (ARIMIDEX) 1 MG tablet Take 1 tablet by mouth Daily.      . Ascorbic Acid (VITAMIN C PO) Take 1 tablet by mouth daily.      Marland Kitchen aspirin 81 MG tablet Take 81 mg by mouth daily.      Marland Kitchen levothyroxine (SYNTHROID, LEVOTHROID) 150 MCG tablet Take 150 mcg by mouth daily.        . meloxicam (MOBIC) 15 MG tablet Take 1 tablet by mouth Daily.      . metolazone (ZAROXOLYN) 2.5 MG tablet Take 2.5 mg by mouth daily.      . metoprolol (LOPRESSOR) 100 MG tablet Take 50 mg by mouth 2 (two) times daily.      Marland Kitchen omeprazole (PRILOSEC) 20 MG capsule Take 20 mg by mouth daily.      . potassium chloride (KLOR-CON) 10 MEQ CR tablet Take 20 mEq by mouth daily.       . pravastatin (PRAVACHOL) 40 MG tablet Take 40 mg by mouth at bedtime.       . torsemide (DEMADEX) 20 MG tablet Take 1 tablet (20 mg total) by mouth daily as needed.      . venlafaxine (EFFEXOR-XR) 75 MG 24 hr capsule Take 75 mg by mouth daily.        Marland Kitchen warfarin  (COUMADIN) 3 MG tablet As directed      . Multiple Vitamin (MULTIVITAMIN) tablet Take 1 tablet by mouth daily.         No current facility-administered medications for this visit.     Past Medical History  Diagnosis Date  . Hypertension   . Hyperlipidemia   . Hypothyroid   . Atrial fibrillation     Echocardiogram 5/12: normal LV function, moderate LAE, mild RAE, mild AI and MR and mild-to-moderate TR.  Myoview 5/ 12 showed an ejection fraction of 73% and normal perfusion.   Patient underwent elective DCCV on 08/26/10    Past Surgical History  Procedure Laterality Date  . Abdominal hysterectomy    . Back surgery    . Total knee arthroplasty    . Tmj arthroplasty    . Tonsillectomy    . Appendectomy    . Cardioversion  11/10/2011    Procedure: CARDIOVERSION;  Surgeon: Lelon Perla, MD;  Location: [facility] OR;  Service: Cardiovascular;  Laterality: N/A;    History   Social History  . Marital Status: Married    Spouse Name: N/A    Number of Children: 2  . Years of Education: N/A   Occupational History  . Not  on file.   Social History Main Topics  . Smoking status: Never Smoker   . Smokeless tobacco: Not on file  . Alcohol Use: No  . Drug Use: Not on file  . Sexually Active: Not on file   Other Topics Concern  . Not on file   Social History Narrative  . No narrative on file    ROS: no fevers or chills, productive cough, hemoptysis, dysphasia, odynophagia, melena, hematochezia, dysuria, hematuria, rash, seizure activity, orthopnea, PND, pedal edema, claudication. Remaining systems are negative.  Physical Exam: Well-developed well-nourished in no acute distress.  Skin is warm and dry.  HEENT is normal.  Neck is supple.  Chest is clear to auscultation with normal expansion.  Cardiovascular exam is regular rate and rhythm.  Abdominal exam nontender or distended. No masses palpated. Extremities show trace edema. neuro grossly intact  ECG sinus rhythm at a rate of  58. Prolonged QT interval.

## 2012-07-31 ENCOUNTER — Ambulatory Visit: Payer: Self-pay | Admitting: Internal Medicine

## 2012-08-06 ENCOUNTER — Ambulatory Visit: Payer: Self-pay | Admitting: Surgery

## 2012-08-21 ENCOUNTER — Ambulatory Visit: Payer: Self-pay | Admitting: Surgery

## 2012-09-12 ENCOUNTER — Ambulatory Visit: Payer: Self-pay | Admitting: Oncology

## 2012-09-12 LAB — CBC CANCER CENTER
Basophil #: 0.1 x10 3/mm (ref 0.0–0.1)
Basophil %: 0.8 %
Eosinophil #: 0.2 x10 3/mm (ref 0.0–0.7)
HCT: 39.1 % (ref 35.0–47.0)
HGB: 13 g/dL (ref 12.0–16.0)
Lymphocyte #: 1.7 x10 3/mm (ref 1.0–3.6)
Lymphocyte %: 19.2 %
MCH: 32.4 pg (ref 26.0–34.0)
MCHC: 33.1 g/dL (ref 32.0–36.0)
MCV: 98 fL (ref 80–100)
Neutrophil %: 60.7 %
Platelet: 256 x10 3/mm (ref 150–440)
RBC: 4.01 10*6/uL (ref 3.80–5.20)
RDW: 13.5 % (ref 11.5–14.5)

## 2012-09-12 LAB — COMPREHENSIVE METABOLIC PANEL
BUN: 29 mg/dL — ABNORMAL HIGH (ref 7–18)
Bilirubin,Total: 0.3 mg/dL (ref 0.2–1.0)
Calcium, Total: 8.5 mg/dL (ref 8.5–10.1)
Co2: 31 mmol/L (ref 21–32)
Creatinine: 1.9 mg/dL — ABNORMAL HIGH (ref 0.60–1.30)
Glucose: 86 mg/dL (ref 65–99)
Osmolality: 286 (ref 275–301)
SGOT(AST): 21 U/L (ref 15–37)
SGPT (ALT): 33 U/L (ref 12–78)
Sodium: 141 mmol/L (ref 136–145)
Total Protein: 6.8 g/dL (ref 6.4–8.2)

## 2012-09-15 ENCOUNTER — Ambulatory Visit: Payer: Self-pay | Admitting: Oncology

## 2012-09-26 ENCOUNTER — Ambulatory Visit: Payer: Self-pay | Admitting: Vascular Surgery

## 2012-09-26 LAB — BASIC METABOLIC PANEL
Anion Gap: 6 — ABNORMAL LOW (ref 7–16)
BUN: 31 mg/dL — ABNORMAL HIGH (ref 7–18)
Calcium, Total: 9.2 mg/dL (ref 8.5–10.1)
Chloride: 102 mmol/L (ref 98–107)
Co2: 32 mmol/L (ref 21–32)
Creatinine: 1.74 mg/dL — ABNORMAL HIGH (ref 0.60–1.30)
Glucose: 83 mg/dL (ref 65–99)

## 2012-09-26 LAB — CBC
HCT: 38.5 % (ref 35.0–47.0)
MCHC: 33.6 g/dL (ref 32.0–36.0)
Platelet: 278 10*3/uL (ref 150–440)
RBC: 4.04 10*6/uL (ref 3.80–5.20)
RDW: 13.8 % (ref 11.5–14.5)

## 2012-09-27 ENCOUNTER — Ambulatory Visit: Payer: Self-pay | Admitting: Vascular Surgery

## 2012-09-27 LAB — POTASSIUM: Potassium: 3.2 mmol/L — ABNORMAL LOW (ref 3.5–5.1)

## 2012-09-27 LAB — PROTIME-INR: INR: 2.4

## 2012-10-15 ENCOUNTER — Ambulatory Visit: Payer: Self-pay | Admitting: Oncology

## 2012-11-04 ENCOUNTER — Other Ambulatory Visit: Payer: Self-pay | Admitting: Cardiology

## 2012-11-08 ENCOUNTER — Other Ambulatory Visit: Payer: Self-pay | Admitting: Cardiology

## 2012-11-18 ENCOUNTER — Telehealth: Payer: Self-pay | Admitting: Cardiology

## 2012-11-18 NOTE — Telephone Encounter (Signed)
Received request from Nurse.   To: [facility] Fax number: [phone] [place] To hold Coumadin  11/18/12/KM

## 2012-11-26 ENCOUNTER — Other Ambulatory Visit: Payer: Self-pay | Admitting: Gastroenterology

## 2012-12-11 ENCOUNTER — Ambulatory Visit: Payer: Self-pay | Admitting: Gastroenterology

## 2012-12-18 ENCOUNTER — Ambulatory Visit: Payer: Self-pay | Admitting: Oncology

## 2012-12-18 LAB — CBC CANCER CENTER
Eosinophil %: 1.5 %
HCT: 37.7 % (ref 35.0–47.0)
HGB: 12.4 g/dL (ref 12.0–16.0)
MCH: 31.6 pg (ref 26.0–34.0)
MCHC: 33.1 g/dL (ref 32.0–36.0)
MCV: 96 fL (ref 80–100)
Monocyte #: 1.1 x10 3/mm — ABNORMAL HIGH (ref 0.2–0.9)
Neutrophil #: 5.6 x10 3/mm (ref 1.4–6.5)
Neutrophil %: 65.4 %
Platelet: 284 x10 3/mm (ref 150–440)
RBC: 3.94 10*6/uL (ref 3.80–5.20)
RDW: 13.8 % (ref 11.5–14.5)

## 2012-12-18 LAB — COMPREHENSIVE METABOLIC PANEL
Albumin: 3 g/dL — ABNORMAL LOW (ref 3.4–5.0)
Alkaline Phosphatase: 90 U/L (ref 50–136)
Anion Gap: 4 — ABNORMAL LOW (ref 7–16)
Bilirubin,Total: 0.3 mg/dL (ref 0.2–1.0)
Co2: 34 mmol/L — ABNORMAL HIGH (ref 21–32)
Creatinine: 1.64 mg/dL — ABNORMAL HIGH (ref 0.60–1.30)
EGFR (African American): 35 — ABNORMAL LOW
Glucose: 90 mg/dL (ref 65–99)
Osmolality: 283 (ref 275–301)
SGPT (ALT): 26 U/L (ref 12–78)
Sodium: 141 mmol/L (ref 136–145)

## 2013-01-03 ENCOUNTER — Other Ambulatory Visit: Payer: Self-pay | Admitting: Gastroenterology

## 2013-01-04 LAB — WBCS, STOOL

## 2013-01-14 ENCOUNTER — Ambulatory Visit (INDEPENDENT_AMBULATORY_CARE_PROVIDER_SITE_OTHER)
Admission: RE | Admit: 2013-01-14 | Discharge: 2013-01-14 | Disposition: A | Discharge status: Home / Self Care | Payer: Medicare Other | Source: Ambulatory Visit | Attending: Cardiology | Admitting: Cardiology

## 2013-01-14 ENCOUNTER — Encounter: Payer: Self-pay | Admitting: Cardiology

## 2013-01-14 ENCOUNTER — Ambulatory Visit (INDEPENDENT_AMBULATORY_CARE_PROVIDER_SITE_OTHER): Payer: Medicare Other | Admitting: Cardiology

## 2013-01-14 VITALS — BP 169/62 | HR 59 | Ht 70.0 in | Wt 154.6 lb

## 2013-01-14 DIAGNOSIS — I10 Essential (primary) hypertension: Secondary | ICD-10-CM

## 2013-01-14 DIAGNOSIS — I4891 Unspecified atrial fibrillation: Secondary | ICD-10-CM

## 2013-01-14 DIAGNOSIS — N289 Disorder of kidney and ureter, unspecified: Secondary | ICD-10-CM

## 2013-01-14 NOTE — Patient Instructions (Addendum)
Your physician wants you to follow-up in: [place] will receive a reminder letter in the mail two months in advance. If you don't receive a letter, please call our office to schedule the follow-up appointment.   A chest x-ray takes a picture of the organs and structures inside the chest, including the heart, lungs, and blood vessels. This test can show several things, including, whether the heart is enlarges; whether fluid is building up in the lungs; and whether pacemaker / defibrillator leads are still in place. AT [place] OFFICE

## 2013-01-14 NOTE — Assessment & Plan Note (Addendum)
Blood pressure is mildly elevated but she states typically controlled. Continue present medications. Monitor and adjust regimen as needed.

## 2013-01-14 NOTE — Assessment & Plan Note (Signed)
Renal function monitored by primary care.

## 2013-01-14 NOTE — Assessment & Plan Note (Signed)
Patient remains in sinus rhythm. Continue amiodarone. TSH and liver functions as well as hemoglobin monitored by primary care. Check chest x-ray. Continue Coumadin.

## 2013-01-14 NOTE — Progress Notes (Signed)
HPI: Pleasant female for fu of atrial fibrillation. Myoview in May of 2012 showed an ejection fraction of 73% and normal perfusion. Patient underwent elective DCCV on 08/26/10. Patient developed recurrent atrial fibrillation and amiodarone added. Patient had successful cardioversion on 11/10/2011. Followup echocardiogram in November of 2013 showed normal LV function, moderate left atrial enlargement, mild aortic and mitral regurgitation and restrictive filling. Since I last saw her in April 2014 she notes some dyspnea on exertion but no orthopnea or PND. Her pedal edema is relatively well controlled with diuretics. No chest pain or syncope. No episodes of atrial fibrillation.   Current Outpatient Prescriptions  Medication Sig Dispense Refill  . amiodarone (PACERONE) 200 MG tablet TAKE ONE TABLET BY MOUTH EVERY DAY  30 tablet  0  . anastrozole (ARIMIDEX) 1 MG tablet Take 1 tablet by mouth Daily.      . Ascorbic Acid (VITAMIN C PO) Take 1 tablet by mouth daily.      Marland Kitchen levothyroxine (SYNTHROID, LEVOTHROID) 150 MCG tablet Take 150 mcg by mouth daily.        . meloxicam (MOBIC) 15 MG tablet Take 1 tablet by mouth Daily.      . metolazone (ZAROXOLYN) 2.5 MG tablet Take 2.5 mg by mouth daily.      . metoprolol (LOPRESSOR) 100 MG tablet Take 50 mg by mouth 2 (two) times daily.      . Multiple Vitamin (MULTIVITAMIN) tablet Take 1 tablet by mouth daily.        Marland Kitchen omeprazole (PRILOSEC) 20 MG capsule Take 20 mg by mouth daily.      . potassium chloride (KLOR-CON) 10 MEQ CR tablet Take 20 mEq by mouth daily.       . pravastatin (PRAVACHOL) 40 MG tablet Take 40 mg by mouth at bedtime.       . torsemide (DEMADEX) 20 MG tablet Take 1 tablet (20 mg total) by mouth daily as needed.      . venlafaxine (EFFEXOR-XR) 75 MG 24 hr capsule Take 75 mg by mouth daily.        Marland Kitchen warfarin (COUMADIN) 3 MG tablet As directed       No current facility-administered medications for this visit.     Past Medical History    Diagnosis Date  . Hypertension   . Hyperlipidemia   . Hypothyroid   . Atrial fibrillation     Echocardiogram 5/12: normal LV function, moderate LAE, mild RAE, mild AI and MR and mild-to-moderate TR.  Myoview 5/ 12 showed an ejection fraction of 73% and normal perfusion.   Patient underwent elective DCCV on 08/26/10    Past Surgical History  Procedure Laterality Date  . Abdominal hysterectomy    . Back surgery    . Total knee arthroplasty    . Tmj arthroplasty    . Tonsillectomy    . Appendectomy    . Cardioversion  11/10/2011    Procedure: CARDIOVERSION;  Surgeon: Lelon Perla, MD;  Location: [facility] OR;  Service: Cardiovascular;  Laterality: N/A;    History   Social History  . Marital Status: Married    Spouse Name: N/A    Number of Children: 2  . Years of Education: N/A   Occupational History  . Not on file.   Social History Main Topics  . Smoking status: Never Smoker   . Smokeless tobacco: Not on file  . Alcohol Use: No  . Drug Use: Not on file  . Sexual Activity: Not on file  Other Topics Concern  . Not on file   Social History Narrative  . No narrative on file    ROS: right knee pain but no fevers or chills, productive cough, hemoptysis, dysphasia, odynophagia, melena, hematochezia, dysuria, hematuria, rash, seizure activity, orthopnea, PND, claudication. Remaining systems are negative.  Physical Exam: Well-developed well-nourished in no acute distress.  Skin is warm and dry.  HEENT is normal.  Neck is supple.  Chest is clear to auscultation with normal expansion.  Cardiovascular exam is regular rate and rhythm.  Abdominal exam nontender or distended. No masses palpated. Extremities show 1+ ankle edema. neuro grossly intact  ECG sinus rhythm at a rate of 59. Normal axis. Prolonged QT interval. Nonspecific ST changes.

## 2013-01-15 ENCOUNTER — Ambulatory Visit: Payer: Self-pay | Admitting: Oncology

## 2013-01-17 ENCOUNTER — Other Ambulatory Visit: Payer: Self-pay | Admitting: Cardiology

## 2013-02-10 ENCOUNTER — Telehealth: Payer: Self-pay | Admitting: Cardiology

## 2013-02-10 NOTE — Telephone Encounter (Signed)
DC coumadin 5 days prior to procedure and resume day of procedure. Kirk Ruths

## 2013-02-10 NOTE — Telephone Encounter (Signed)
Will forward for dr crenshaw review  

## 2013-02-10 NOTE — Telephone Encounter (Signed)
New Problem     Pt need to know how many days before  Surg. on her Knee, she should stop her coumadin.

## 2013-02-11 NOTE — Telephone Encounter (Signed)
Spoke with pt, Aware of dr crenshaw's recommendations.  °

## 2013-02-26 ENCOUNTER — Emergency Department: Payer: Self-pay | Admitting: Emergency Medicine

## 2013-02-26 LAB — CBC
HCT: 36.8 % (ref 35.0–47.0)
HGB: 12.5 g/dL (ref 12.0–16.0)
MCHC: 33.9 g/dL (ref 32.0–36.0)
MCV: 96 fL (ref 80–100)
Platelet: 253 10*3/uL (ref 150–440)
RBC: 3.84 10*6/uL (ref 3.80–5.20)
RDW: 13.7 % (ref 11.5–14.5)

## 2013-02-26 LAB — BASIC METABOLIC PANEL
Anion Gap: 2 — ABNORMAL LOW (ref 7–16)
BUN: 31 mg/dL — ABNORMAL HIGH (ref 7–18)
Calcium, Total: 9.1 mg/dL (ref 8.5–10.1)
Chloride: 104 mmol/L (ref 98–107)
Co2: 32 mmol/L (ref 21–32)
Creatinine: 1.53 mg/dL — ABNORMAL HIGH (ref 0.60–1.30)
EGFR (African American): 37 — ABNORMAL LOW
EGFR (Non-African Amer.): 32 — ABNORMAL LOW
Glucose: 100 mg/dL — ABNORMAL HIGH (ref 65–99)
Osmolality: 282 (ref 275–301)
Sodium: 138 mmol/L (ref 136–145)

## 2013-02-27 ENCOUNTER — Encounter: Payer: Self-pay | Admitting: Surgery

## 2013-03-07 ENCOUNTER — Other Ambulatory Visit: Payer: Self-pay | Admitting: Cardiology

## 2013-03-17 ENCOUNTER — Encounter: Payer: Self-pay | Admitting: Surgery

## 2013-04-17 ENCOUNTER — Encounter: Payer: Self-pay | Admitting: Surgery

## 2013-05-15 ENCOUNTER — Ambulatory Visit: Payer: Self-pay | Admitting: Pain Medicine

## 2013-05-18 ENCOUNTER — Encounter: Payer: Self-pay | Admitting: Surgery

## 2013-05-26 LAB — WOUND AEROBIC CULTURE

## 2013-06-09 ENCOUNTER — Ambulatory Visit: Payer: Self-pay | Admitting: Oncology

## 2013-06-15 ENCOUNTER — Encounter: Payer: Self-pay | Admitting: Surgery

## 2013-06-18 ENCOUNTER — Ambulatory Visit: Payer: Self-pay | Admitting: Oncology

## 2013-06-18 LAB — CBC CANCER CENTER
BASOS ABS: 0.1 x10 3/mm (ref 0.0–0.1)
Basophil %: 0.9 %
EOS ABS: 0.1 x10 3/mm (ref 0.0–0.7)
Eosinophil %: 1.2 %
HCT: 41.6 % (ref 35.0–47.0)
HGB: 13.5 g/dL (ref 12.0–16.0)
LYMPHS ABS: 1.6 x10 3/mm (ref 1.0–3.6)
LYMPHS PCT: 16.9 %
MCH: 31.7 pg (ref 26.0–34.0)
MCHC: 32.4 g/dL (ref 32.0–36.0)
MCV: 98 fL (ref 80–100)
Monocyte #: 1.3 x10 3/mm — ABNORMAL HIGH (ref 0.2–0.9)
Monocyte %: 13.3 %
NEUTROS ABS: 6.4 x10 3/mm (ref 1.4–6.5)
NEUTROS PCT: 67.7 %
PLATELETS: 276 x10 3/mm (ref 150–440)
RBC: 4.25 10*6/uL (ref 3.80–5.20)
RDW: 14.1 % (ref 11.5–14.5)
WBC: 9.4 x10 3/mm (ref 3.6–11.0)

## 2013-06-18 LAB — COMPREHENSIVE METABOLIC PANEL
ALT: 25 U/L (ref 12–78)
Albumin: 3.1 g/dL — ABNORMAL LOW (ref 3.4–5.0)
Alkaline Phosphatase: 78 U/L
Anion Gap: 8 (ref 7–16)
BILIRUBIN TOTAL: 0.3 mg/dL (ref 0.2–1.0)
BUN: 25 mg/dL — AB (ref 7–18)
CALCIUM: 8.1 mg/dL — AB (ref 8.5–10.1)
CHLORIDE: 105 mmol/L (ref 98–107)
CO2: 28 mmol/L (ref 21–32)
Creatinine: 1.47 mg/dL — ABNORMAL HIGH (ref 0.60–1.30)
EGFR (African American): 39 — ABNORMAL LOW
GFR CALC NON AF AMER: 34 — AB
GLUCOSE: 94 mg/dL (ref 65–99)
OSMOLALITY: 285 (ref 275–301)
Potassium: 4 mmol/L (ref 3.5–5.1)
SGOT(AST): 22 U/L (ref 15–37)
Sodium: 141 mmol/L (ref 136–145)
TOTAL PROTEIN: 6.7 g/dL (ref 6.4–8.2)

## 2013-06-19 LAB — CANCER ANTIGEN 27.29: CA 27.29: 27.2 U/mL (ref 0.0–38.6)

## 2013-07-16 ENCOUNTER — Encounter: Payer: Self-pay | Admitting: Surgery

## 2013-07-16 ENCOUNTER — Ambulatory Visit: Payer: Self-pay | Admitting: Oncology

## 2013-07-23 ENCOUNTER — Ambulatory Visit: Payer: Medicare Other | Admitting: Physician Assistant

## 2013-07-25 ENCOUNTER — Other Ambulatory Visit: Payer: Self-pay

## 2013-07-28 ENCOUNTER — Encounter: Payer: Self-pay | Admitting: Cardiology

## 2013-07-28 ENCOUNTER — Ambulatory Visit (INDEPENDENT_AMBULATORY_CARE_PROVIDER_SITE_OTHER): Payer: Medicare Other | Admitting: Cardiology

## 2013-07-28 VITALS — BP 151/64 | HR 56 | Ht 70.0 in | Wt 166.0 lb

## 2013-07-28 DIAGNOSIS — I10 Essential (primary) hypertension: Secondary | ICD-10-CM

## 2013-07-28 DIAGNOSIS — Z01810 Encounter for preprocedural cardiovascular examination: Secondary | ICD-10-CM | POA: Insufficient documentation

## 2013-07-28 DIAGNOSIS — I4891 Unspecified atrial fibrillation: Secondary | ICD-10-CM

## 2013-07-28 DIAGNOSIS — R609 Edema, unspecified: Secondary | ICD-10-CM | POA: Insufficient documentation

## 2013-07-28 LAB — BASIC METABOLIC PANEL
BUN: 22 mg/dL (ref 6–23)
CHLORIDE: 102 meq/L (ref 96–112)
CO2: 30 mEq/L (ref 19–32)
CREATININE: 1.5 mg/dL — AB (ref 0.4–1.2)
Calcium: 9 mg/dL (ref 8.4–10.5)
GFR: 36.49 mL/min — ABNORMAL LOW (ref >60.00)
Glucose, Bld: 96 mg/dL (ref 70–99)
POTASSIUM: 3.7 meq/L (ref 3.5–5.1)
SODIUM: 140 meq/L (ref 135–145)

## 2013-07-28 LAB — HEPATIC FUNCTION PANEL
ALK PHOS: 73 U/L (ref 39–117)
ALT: 18 U/L (ref 0–35)
AST: 21 U/L (ref 0–37)
Albumin: 3.4 g/dL — ABNORMAL LOW (ref 3.5–5.2)
BILIRUBIN TOTAL: 0.5 mg/dL (ref 0.3–1.2)
Bilirubin, Direct: 0.1 mg/dL (ref 0.0–0.3)
Total Protein: 6.7 g/dL (ref 6.0–8.3)

## 2013-07-28 LAB — TSH: TSH: 3.07 u[IU]/mL (ref 0.35–5.50)

## 2013-07-28 LAB — BRAIN NATRIURETIC PEPTIDE: Pro B Natriuretic peptide (BNP): 392 pg/mL — ABNORMAL HIGH (ref 0.0–100.0)

## 2013-07-28 MED ORDER — TORSEMIDE 20 MG PO TABS: 40.0000 mg | ORAL_TABLET | Freq: Every day | ORAL | Status: DC

## 2013-07-28 NOTE — Assessment & Plan Note (Signed)
Continue present blood pressure medications. 

## 2013-07-28 NOTE — Assessment & Plan Note (Signed)
Patient remains in sinus rhythm.Continue amiodarone. Check TSH, liver functions and chest x-ray. Continue Coumadin.

## 2013-07-28 NOTE — Assessment & Plan Note (Signed)
Patient's lower extremity edema is markedly worse. She is not clear about her diuretic regimen. I have asked her not to take metolazone. Increase Demadex to 40 mg daily. Check potassium and renal function as well as BNP today and repeat renal function in one week. Repeat echocardiogram.

## 2013-07-28 NOTE — Patient Instructions (Signed)
Your physician recommends that you schedule a follow-up appointment in: [place] METOLAZONE TODAY  START TAKING TORSEMIDE 40MG  DAILY  Your physician recommends that you return for lab work in: [place] physician has requested that you have an echocardiogram. Echocardiography is a painless test that uses sound waves to create images of your heart. It provides your doctor with information about the size and shape of your heart and how well your heart's chambers and valves are working. This procedure takes approximately one hour. There are no restrictions for this procedure.  A chest x-ray takes a picture of the organs and structures inside the chest, including the heart, lungs, and blood vessels. This test can show several things, including, whether the heart is enlarges; whether fluid is building up in the lungs; and whether pacemaker / defibrillator leads are still in place.

## 2013-07-28 NOTE — Progress Notes (Signed)
HPI: FU atrial fibrillation. Myoview in May of 2012 showed an ejection fraction of 73% and normal perfusion. Patient underwent elective DCCV on 08/26/10. Patient developed recurrent atrial fibrillation and amiodarone added. Patient had successful cardioversion on 11/10/2011. Followup echocardiogram in November of 2013 showed normal LV function, moderate left atrial enlargement, mild aortic and mitral regurgitation and restrictive filling. Since I last saw her in Oct 2014 She denies dyspnea, chest pain or syncope. No palpitations. Over the past month she has had worsening pedal edema.   Current Outpatient Prescriptions  Medication Sig Dispense Refill  . amiodarone (PACERONE) 200 MG tablet TAKE ONE TABLET BY MOUTH ONCE DAILY  30 tablet  3  . anastrozole (ARIMIDEX) 1 MG tablet Take 1 tablet by mouth Daily.      . Ascorbic Acid (VITAMIN C PO) Take 1 tablet by mouth daily.      Marland Kitchen diltiazem (CARDIZEM SR) 90 MG 12 hr capsule       . HYDROcodone-acetaminophen (NORCO/VICODIN) 5-325 MG per tablet       . levothyroxine (SYNTHROID, LEVOTHROID) 150 MCG tablet Take 150 mcg by mouth daily.        . meloxicam (MOBIC) 15 MG tablet Take 1 tablet by mouth Daily.      . metolazone (ZAROXOLYN) 2.5 MG tablet Take 2.5 mg by mouth daily.      . metoprolol (LOPRESSOR) 100 MG tablet Take 50 mg by mouth 2 (two) times daily.      . Multiple Vitamin (MULTIVITAMIN) tablet Take 1 tablet by mouth daily.        Marland Kitchen omeprazole (PRILOSEC) 20 MG capsule Take 20 mg by mouth daily.      . potassium chloride (KLOR-CON) 10 MEQ CR tablet Take 20 mEq by mouth daily.       . pravastatin (PRAVACHOL) 40 MG tablet Take 40 mg by mouth at bedtime.       . torsemide (DEMADEX) 20 MG tablet Take 1 tablet (20 mg total) by mouth daily as needed.      . venlafaxine (EFFEXOR-XR) 75 MG 24 hr capsule Take 75 mg by mouth daily.        Marland Kitchen warfarin (COUMADIN) 3 MG tablet As directed       No current facility-administered medications for this  visit.     Past Medical History  Diagnosis Date  . Hypertension   . Hyperlipidemia   . Hypothyroid   . Atrial fibrillation     Echocardiogram 5/12: normal LV function, moderate LAE, mild RAE, mild AI and MR and mild-to-moderate TR.  Myoview 5/ 12 showed an ejection fraction of 73% and normal perfusion.   Patient underwent elective DCCV on 08/26/10    Past Surgical History  Procedure Laterality Date  . Abdominal hysterectomy    . Back surgery    . Total knee arthroplasty    . Tmj arthroplasty    . Tonsillectomy    . Appendectomy    . Cardioversion  11/10/2011    Procedure: CARDIOVERSION;  Surgeon: Lelon Perla, MD;  Location: [facility] OR;  Service: Cardiovascular;  Laterality: N/A;    History   Social History  . Marital Status: Married    Spouse Name: N/A    Number of Children: 2  . Years of Education: N/A   Occupational History  . Not on file.   Social History Main Topics  . Smoking status: Never Smoker   . Smokeless tobacco: Not on file  . Alcohol Use: No  .  Drug Use: Not on file  . Sexual Activity: Not on file   Other Topics Concern  . Not on file   Social History Narrative  . No narrative on file    ROS: Knee arthralgias but no fevers or chills, productive cough, hemoptysis, dysphasia, odynophagia, melena, hematochezia, dysuria, hematuria, rash, seizure activity, orthopnea, PND, claudication. Remaining systems are negative.  Physical Exam: Well-developed well-nourished in no acute distress.  Skin is warm and dry.  HEENT is normal.  Neck is supple.  Chest is clear to auscultation with normal expansion.  Cardiovascular exam is regular rate and rhythm.  Abdominal exam nontender or distended. No masses palpated. Extremities show 3+ edema from mid tibia to the foot as well as chronic skin changes. neuro grossly intact  ECG Sinus rhythm at a rate of 54. Prolonged QT interval. Nonspecific ST changes.

## 2013-07-28 NOTE — Assessment & Plan Note (Signed)
Patient is scheduled for right knee replacement. I do not see any contraindication from a cardiac standpoint if LV function is normal on followup echo and lower extremity edema improves with diuresis.

## 2013-07-29 ENCOUNTER — Other Ambulatory Visit: Payer: Self-pay | Admitting: Cardiology

## 2013-07-30 ENCOUNTER — Telehealth: Payer: Self-pay | Admitting: Cardiology

## 2013-07-30 NOTE — Telephone Encounter (Signed)
New message     Calling to give you a list of her medications.  Call after 1pm

## 2013-07-31 NOTE — Telephone Encounter (Signed)
Spoke with pt, Aware of dr crenshaw's recommendations.  °

## 2013-07-31 NOTE — Telephone Encounter (Signed)
Spoke with pt, medication list updated. Will forward for dr Stanford Breed review

## 2013-07-31 NOTE — Telephone Encounter (Signed)
Left message for pt to call.

## 2013-07-31 NOTE — Telephone Encounter (Signed)
Change demadex to 40 daily as outlined in office note BMET as scheduled Lelon Perla

## 2013-07-31 NOTE — Telephone Encounter (Signed)
Follow up ° ° ° °Returning [name]'s call °

## 2013-08-01 ENCOUNTER — Encounter: Payer: Self-pay | Admitting: Cardiology

## 2013-08-01 ENCOUNTER — Ambulatory Visit (HOSPITAL_COMMUNITY): Payer: Medicare Other | Attending: Cardiology | Admitting: Radiology

## 2013-08-01 ENCOUNTER — Ambulatory Visit (INDEPENDENT_AMBULATORY_CARE_PROVIDER_SITE_OTHER)
Admission: RE | Admit: 2013-08-01 | Discharge: 2013-08-01 | Disposition: A | Discharge status: Home / Self Care | Payer: Medicare Other | Source: Ambulatory Visit | Attending: Cardiology | Admitting: Cardiology

## 2013-08-01 DIAGNOSIS — Z01810 Encounter for preprocedural cardiovascular examination: Secondary | ICD-10-CM

## 2013-08-01 DIAGNOSIS — R609 Edema, unspecified: Secondary | ICD-10-CM

## 2013-08-01 DIAGNOSIS — I4891 Unspecified atrial fibrillation: Secondary | ICD-10-CM

## 2013-08-01 DIAGNOSIS — I10 Essential (primary) hypertension: Secondary | ICD-10-CM

## 2013-08-01 NOTE — Progress Notes (Signed)
Echocardiogram performed.  

## 2013-08-04 ENCOUNTER — Other Ambulatory Visit: Payer: Self-pay | Admitting: Cardiology

## 2013-08-04 ENCOUNTER — Encounter: Payer: Self-pay | Admitting: Cardiology

## 2013-08-04 ENCOUNTER — Ambulatory Visit (INDEPENDENT_AMBULATORY_CARE_PROVIDER_SITE_OTHER): Payer: Medicare Other | Admitting: Cardiology

## 2013-08-04 VITALS — BP 138/68 | HR 56 | Ht 70.0 in | Wt 159.0 lb

## 2013-08-04 DIAGNOSIS — N289 Disorder of kidney and ureter, unspecified: Secondary | ICD-10-CM

## 2013-08-04 DIAGNOSIS — R0602 Shortness of breath: Secondary | ICD-10-CM

## 2013-08-04 LAB — BASIC METABOLIC PANEL
BUN: 29 mg/dL — ABNORMAL HIGH (ref 6–23)
CHLORIDE: 97 meq/L (ref 96–112)
CO2: 33 meq/L — AB (ref 19–32)
Calcium: 9.2 mg/dL (ref 8.4–10.5)
Creatinine, Ser: 1.7 mg/dL — ABNORMAL HIGH (ref 0.4–1.2)
GFR: 30.44 mL/min — ABNORMAL LOW (ref >60.00)
Glucose, Bld: 84 mg/dL (ref 70–99)
Potassium: 3.7 mEq/L (ref 3.5–5.1)
Sodium: 140 mEq/L (ref 135–145)

## 2013-08-04 LAB — BRAIN NATRIURETIC PEPTIDE: PRO B NATRI PEPTIDE: 258 pg/mL — AB (ref 0.0–100.0)

## 2013-08-04 MED ORDER — METOPROLOL TARTRATE 100 MG PO TABS: 50.0000 mg | ORAL_TABLET | Freq: Two times a day (BID) | ORAL | Status: DC

## 2013-08-04 MED ORDER — TORSEMIDE 10 MG PO TABS: 10.0000 mg | ORAL_TABLET | Freq: Every day | ORAL | Status: DC

## 2013-08-04 MED ORDER — AMIODARONE HCL 200 MG PO TABS: 200.0000 mg | ORAL_TABLET | Freq: Every day | ORAL | Status: DC

## 2013-08-04 NOTE — Progress Notes (Signed)
HPI: FU atrial fibrillation. Myoview in May of 2012 showed an ejection fraction of 73% and normal perfusion. Patient underwent elective DCCV on 08/26/10. Patient developed recurrent atrial fibrillation and amiodarone added. Patient had successful cardioversion on 11/10/2011. Echo repeated in 4/15 and showed normal LV function; grade 2 diastolic dysfunction, mild LAE and mild MR/AI. Diuretics increased at last ov for lower ext edema and BNP 392. Since then,  Her pedal edema has improved. She denies dyspnea, chest pain, palpitations or syncope.   Current Outpatient Prescriptions  Medication Sig Dispense Refill  . amiodarone (PACERONE) 200 MG tablet TAKE ONE TABLET BY MOUTH ONCE DAILY  30 tablet  0  . anastrozole (ARIMIDEX) 1 MG tablet Take 1 tablet by mouth Daily.      . Ascorbic Acid (VITAMIN C PO) Take 1 tablet by mouth daily.      . calcium-vitamin D (OSCAL WITH D) 500-200 MG-UNIT per tablet Take 1 tablet by mouth daily with breakfast.      . Cholecalciferol (VITAMIN D-3 PO) Take 1 tablet by mouth daily.      Marland Kitchen diltiazem (CARDIZEM SR) 90 MG 12 hr capsule Take 90 mg by mouth daily.       Marland Kitchen HYDROcodone-acetaminophen (NORCO/VICODIN) 5-325 MG per tablet Take 1 tablet by mouth every 6 (six) hours as needed.       . Iron-Vitamin C 65-125 MG TABS Take 1 tablet by mouth daily.      Marland Kitchen levothyroxine (SYNTHROID, LEVOTHROID) 150 MCG tablet Take 150 mcg by mouth daily.        . metoprolol (LOPRESSOR) 100 MG tablet Take 100 mg by mouth 2 (two) times daily.       . Multiple Vitamin (MULTIVITAMIN) tablet Take 1 tablet by mouth daily.        Marland Kitchen omeprazole (PRILOSEC) 20 MG capsule Take 20 mg by mouth daily.      . potassium chloride (KLOR-CON) 10 MEQ CR tablet Take 20 mEq by mouth daily. PT TAKES TWO EXTRA TABLETS WITH EXTRA FLUID PILL      . pravastatin (PRAVACHOL) 40 MG tablet Take 40 mg by mouth at bedtime.       . torsemide (DEMADEX) 20 MG tablet Take 20 mg by mouth daily. TAKE EXTRA 20 MG AS NEEDED       . venlafaxine (EFFEXOR-XR) 75 MG 24 hr capsule Take 75 mg by mouth daily.        Marland Kitchen warfarin (COUMADIN) 3 MG tablet As directed       No current facility-administered medications for this visit.     Past Medical History  Diagnosis Date  . Hypertension   . Hyperlipidemia   . Hypothyroid   . Atrial fibrillation     Echocardiogram 5/12: normal LV function, moderate LAE, mild RAE, mild AI and MR and mild-to-moderate TR.  Myoview 5/ 12 showed an ejection fraction of 73% and normal perfusion.   Patient underwent elective DCCV on 08/26/10    Past Surgical History  Procedure Laterality Date  . Abdominal hysterectomy    . Back surgery    . Total knee arthroplasty    . Tmj arthroplasty    . Tonsillectomy    . Appendectomy    . Cardioversion  11/10/2011    Procedure: CARDIOVERSION;  Surgeon: Lelon Perla, MD;  Location: [facility] OR;  Service: Cardiovascular;  Laterality: N/A;    History   Social History  . Marital Status: Married    Spouse Name: N/A  Number of Children: 2  . Years of Education: N/A   Occupational History  . Not on file.   Social History Main Topics  . Smoking status: Never Smoker   . Smokeless tobacco: Not on file  . Alcohol Use: No  . Drug Use: Not on file  . Sexual Activity: Not on file   Other Topics Concern  . Not on file   Social History Narrative  . No narrative on file    ROS: no fevers or chills, productive cough, hemoptysis, dysphasia, odynophagia, melena, hematochezia, dysuria, hematuria, rash, seizure activity, orthopnea, PND, pedal edema, claudication. Remaining systems are negative.  Physical Exam: Well-developed well-nourished in no acute distress.  Skin is warm and dry.  HEENT is normal.  Neck is supple.  Chest is clear to auscultation with normal expansion.  Cardiovascular exam is regular rate and rhythm.  Abdominal exam nontender or distended. No masses palpated. Extremities show 1+ edema With mild erythema. neuro grossly  intact

## 2013-08-04 NOTE — Assessment & Plan Note (Signed)
Patient remains in sinus rhythm on examination.Continue amiodarone, Cardizem and metoprolol. Continue Coumadin.

## 2013-08-04 NOTE — Assessment & Plan Note (Signed)
Blood pressure controlled. Continue present medications. 

## 2013-08-04 NOTE — Assessment & Plan Note (Signed)
Follow renal function closely with recent increased dose of Demadex.

## 2013-08-04 NOTE — Patient Instructions (Signed)
Your physician wants you to follow-up in: [place] will receive a reminder letter in the mail two months in advance. If you don't receive a letter, please call our office to schedule the follow-up appointment.   INCREASE TORSEMIDE TO 30 MG ONCE DAILY= ONE 20 MG AND ONE 10 MG TABLET AT THE SAME TIME ONCE DAILY  Your physician recommends that you HAVE LAB WORK TODAY   DECREASE METOPROLOL TO 50 MG TWICE DAILY= 1/2 50 MG TABLET TWICE DAILY

## 2013-08-04 NOTE — Assessment & Plan Note (Signed)
Much improved following increased dose of Demadex. I will change to 30 mg daily. Check potassium, renal function and BNP.

## 2013-08-05 ENCOUNTER — Other Ambulatory Visit: Payer: Medicare Other

## 2013-08-15 ENCOUNTER — Ambulatory Visit: Payer: Self-pay | Admitting: Oncology

## 2013-08-15 ENCOUNTER — Encounter: Payer: Self-pay | Admitting: Surgery

## 2013-08-27 ENCOUNTER — Encounter: Payer: Self-pay | Admitting: Internal Medicine

## 2013-08-29 LAB — URINALYSIS, COMPLETE
BACTERIA: NONE SEEN
BILIRUBIN, UR: NEGATIVE
Blood: NEGATIVE
GLUCOSE, UR: NEGATIVE mg/dL (ref 0–75)
KETONE: NEGATIVE
Leukocyte Esterase: NEGATIVE
NITRITE: NEGATIVE
PH: 8 (ref 4.5–8.0)
Protein: NEGATIVE
RBC,UR: NONE SEEN /HPF (ref 0–5)
SQUAMOUS EPITHELIAL: NONE SEEN
Specific Gravity: 1.005 (ref 1.003–1.030)

## 2013-08-30 LAB — URINE CULTURE

## 2013-09-02 LAB — BASIC METABOLIC PANEL
Anion Gap: 8 (ref 7–16)
BUN: 22 mg/dL — AB (ref 7–18)
CREATININE: 1.58 mg/dL — AB (ref 0.60–1.30)
Calcium, Total: 9 mg/dL (ref 8.5–10.1)
Chloride: 99 mmol/L (ref 98–107)
Co2: 31 mmol/L (ref 21–32)
EGFR (African American): 36 — ABNORMAL LOW
EGFR (Non-African Amer.): 31 — ABNORMAL LOW
GLUCOSE: 74 mg/dL (ref 65–99)
Osmolality: 278 (ref 275–301)
POTASSIUM: 3.2 mmol/L — AB (ref 3.5–5.1)
Sodium: 138 mmol/L (ref 136–145)

## 2013-09-02 LAB — CBC WITH DIFFERENTIAL/PLATELET
Basophil #: 0.1 10*3/uL (ref 0.0–0.1)
Basophil %: 0.6 %
EOS ABS: 0.2 10*3/uL (ref 0.0–0.7)
Eosinophil %: 1.1 %
HCT: 32.2 % — ABNORMAL LOW (ref 35.0–47.0)
HGB: 10.4 g/dL — ABNORMAL LOW (ref 12.0–16.0)
LYMPHS ABS: 1.7 10*3/uL (ref 1.0–3.6)
LYMPHS PCT: 10 %
MCH: 32 pg (ref 26.0–34.0)
MCHC: 32.3 g/dL (ref 32.0–36.0)
MCV: 99 fL (ref 80–100)
MONO ABS: 1.9 x10 3/mm — AB (ref 0.2–0.9)
MONOS PCT: 10.8 %
NEUTROS ABS: 13.5 10*3/uL — AB (ref 1.4–6.5)
NEUTROS PCT: 77.5 %
PLATELETS: 629 10*3/uL — AB (ref 150–440)
RBC: 3.25 10*6/uL — ABNORMAL LOW (ref 3.80–5.20)
RDW: 13.5 % (ref 11.5–14.5)
WBC: 17.4 10*3/uL — ABNORMAL HIGH (ref 3.6–11.0)

## 2013-09-02 LAB — COMPREHENSIVE METABOLIC PANEL
ALK PHOS: 117 U/L
ALT: 46 U/L (ref 12–78)
Albumin: 2.7 g/dL — ABNORMAL LOW (ref 3.4–5.0)
Bilirubin,Total: 0.5 mg/dL (ref 0.2–1.0)
SGOT(AST): 80 U/L — ABNORMAL HIGH (ref 15–37)
Total Protein: 6.8 g/dL (ref 6.4–8.2)

## 2013-09-02 LAB — TSH: Thyroid Stimulating Horm: 8.61 u[IU]/mL — ABNORMAL HIGH

## 2013-09-02 LAB — PROTIME-INR
INR: 1.3
Prothrombin Time: 16 secs — ABNORMAL HIGH (ref 11.5–14.7)

## 2013-09-04 LAB — CBC WITH DIFFERENTIAL/PLATELET
BASOS ABS: 0.1 10*3/uL (ref 0.0–0.1)
BASOS PCT: 0.8 %
Eosinophil #: 0.3 10*3/uL (ref 0.0–0.7)
Eosinophil %: 2.4 %
HCT: 30 % — AB (ref 35.0–47.0)
HGB: 10 g/dL — ABNORMAL LOW (ref 12.0–16.0)
Lymphocyte #: 1.4 10*3/uL (ref 1.0–3.6)
Lymphocyte %: 13.2 %
MCH: 33.3 pg (ref 26.0–34.0)
MCHC: 33.5 g/dL (ref 32.0–36.0)
MCV: 100 fL (ref 80–100)
Monocyte #: 1.1 x10 3/mm — ABNORMAL HIGH (ref 0.2–0.9)
Monocyte %: 10.2 %
Neutrophil #: 7.8 10*3/uL — ABNORMAL HIGH (ref 1.4–6.5)
Neutrophil %: 73.4 %
Platelet: 512 10*3/uL — ABNORMAL HIGH (ref 150–440)
RBC: 3.02 10*6/uL — ABNORMAL LOW (ref 3.80–5.20)
RDW: 14.2 % (ref 11.5–14.5)
WBC: 10.6 10*3/uL (ref 3.6–11.0)

## 2013-09-04 LAB — COMPREHENSIVE METABOLIC PANEL
ALK PHOS: 105 U/L
ALT: 48 U/L (ref 12–78)
Albumin: 2.2 g/dL — ABNORMAL LOW (ref 3.4–5.0)
Anion Gap: 4 — ABNORMAL LOW (ref 7–16)
BILIRUBIN TOTAL: 0.3 mg/dL (ref 0.2–1.0)
BUN: 23 mg/dL — AB (ref 7–18)
CALCIUM: 8.6 mg/dL (ref 8.5–10.1)
Chloride: 102 mmol/L (ref 98–107)
Co2: 30 mmol/L (ref 21–32)
Creatinine: 1.83 mg/dL — ABNORMAL HIGH (ref 0.60–1.30)
EGFR (African American): 30 — ABNORMAL LOW
GFR CALC NON AF AMER: 26 — AB
GLUCOSE: 71 mg/dL (ref 65–99)
Osmolality: 274 (ref 275–301)
POTASSIUM: 3.6 mmol/L (ref 3.5–5.1)
SGOT(AST): 71 U/L — ABNORMAL HIGH (ref 15–37)
Sodium: 136 mmol/L (ref 136–145)
TOTAL PROTEIN: 6.1 g/dL — AB (ref 6.4–8.2)

## 2013-09-04 LAB — PROTIME-INR
INR: 1.8
Prothrombin Time: 20.3 secs — ABNORMAL HIGH (ref 11.5–14.7)

## 2013-09-04 LAB — MAGNESIUM: Magnesium: 1.9 mg/dL

## 2013-09-04 LAB — TSH: THYROID STIMULATING HORM: 7.65 u[IU]/mL — AB

## 2013-09-08 LAB — PROTIME-INR
INR: 1.3
Prothrombin Time: 16.3 secs — ABNORMAL HIGH (ref 11.5–14.7)

## 2013-09-15 ENCOUNTER — Encounter: Payer: Self-pay | Admitting: Internal Medicine

## 2014-01-15 ENCOUNTER — Encounter: Payer: Self-pay | Admitting: Orthopedic Surgery

## 2014-02-15 ENCOUNTER — Encounter: Payer: Self-pay | Admitting: Orthopedic Surgery

## 2014-02-23 ENCOUNTER — Encounter: Payer: Self-pay | Admitting: Surgery

## 2014-02-24 NOTE — Progress Notes (Signed)
HPI: FU atrial fibrillation. Myoview in May of 2012 showed an ejection fraction of 73% and normal perfusion. Patient underwent elective DCCV on 08/26/10. Patient developed recurrent atrial fibrillation and amiodarone added. Patient had successful cardioversion on 11/10/2011. Echo repeated in 4/15 and showed normal LV function; grade 2 diastolic dysfunction, mild LAE and mild MR/AI. Diuretics increased at last ov. Since last seen, the patient has dyspnea with more extreme activities but not with routine activities. It is relieved with rest. It is not associated with chest pain. There is no orthopnea, PND. There is no syncope or palpitations. There is no exertional chest pain. She does have increased pedal edema. She has fallen 3 times in the past 1 month.   Current Outpatient Prescriptions  Medication Sig Dispense Refill  . amiodarone (PACERONE) 200 MG tablet Take 1 tablet (200 mg total) by mouth daily. 90 tablet 3  . anastrozole (ARIMIDEX) 1 MG tablet Take 1 tablet by mouth Daily.    . Ascorbic Acid (VITAMIN C PO) Take 1 tablet by mouth daily.    . calcium-vitamin D (OSCAL WITH D) 500-200 MG-UNIT per tablet Take 1 tablet by mouth daily with breakfast.    . celecoxib (CELEBREX) 100 MG capsule   3  . Cholecalciferol (VITAMIN D-3 PO) Take 1 tablet by mouth daily.    Marland Kitchen diltiazem (CARDIZEM SR) 90 MG 12 hr capsule Take 90 mg by mouth daily.     Marland Kitchen doxycycline (VIBRA-TABS) 100 MG tablet   0  . HYDROcodone-acetaminophen (NORCO/VICODIN) 5-325 MG per tablet Take 1 tablet by mouth every 6 (six) hours as needed.     . Iron-Vitamin C 65-125 MG TABS Take 1 tablet by mouth daily.    Marland Kitchen levothyroxine (SYNTHROID, LEVOTHROID) 150 MCG tablet Take 150 mcg by mouth daily.      . metoprolol (LOPRESSOR) 100 MG tablet Take 0.5 tablets (50 mg total) by mouth 2 (two) times daily.    . Multiple Vitamin (MULTIVITAMIN) tablet Take 1 tablet by mouth daily.      Marland Kitchen omeprazole (PRILOSEC) 20 MG capsule Take 20 mg by mouth  daily.    . potassium chloride (KLOR-CON) 10 MEQ CR tablet Take 20 mEq by mouth daily. PT TAKES TWO EXTRA TABLETS WITH EXTRA FLUID PILL    . pravastatin (PRAVACHOL) 40 MG tablet Take 40 mg by mouth at bedtime.     . torsemide (DEMADEX) 10 MG tablet Take 1 tablet (10 mg total) by mouth daily. 90 tablet 3  . torsemide (DEMADEX) 20 MG tablet Take 20 mg by mouth daily. TAKE EXTRA 20 MG AS NEEDED    . venlafaxine (EFFEXOR-XR) 75 MG 24 hr capsule Take 75 mg by mouth daily.      Marland Kitchen warfarin (COUMADIN) 3 MG tablet As directed     No current facility-administered medications for this visit.     Past Medical History  Diagnosis Date  . Hypertension   . Hyperlipidemia   . Hypothyroid   . Atrial fibrillation     Echocardiogram 5/12: normal LV function, moderate LAE, mild RAE, mild AI and MR and mild-to-moderate TR.  Myoview 5/ 12 showed an ejection fraction of 73% and normal perfusion.   Patient underwent elective DCCV on 08/26/10    Past Surgical History  Procedure Laterality Date  . Abdominal hysterectomy    . Back surgery    . Total knee arthroplasty    . Tmj arthroplasty    . Tonsillectomy    . Appendectomy    .  Cardioversion  11/10/2011    Procedure: CARDIOVERSION;  Surgeon: Lelon Perla, MD;  Location: [facility] OR;  Service: Cardiovascular;  Laterality: N/A;    History   Social History  . Marital Status: Married    Spouse Name: N/A    Number of Children: 2  . Years of Education: N/A   Occupational History  . Not on file.   Social History Main Topics  . Smoking status: Never Smoker   . Smokeless tobacco: Not on file  . Alcohol Use: No  . Drug Use: Not on file  . Sexual Activity: Not on file   Other Topics Concern  . Not on file   Social History Narrative    ROS: no fevers or chills, productive cough, hemoptysis, dysphasia, odynophagia, melena, hematochezia, dysuria, hematuria, rash, seizure activity, orthopnea, PND, claudication. Remaining systems are negative.  Physical  Exam: Well-developed well-nourished in no acute distress.  Skin is warm and dry.  HEENT is normal.  Neck is supple.  Chest is clear to auscultation with normal expansion.  Cardiovascular exam is regular rate and rhythm. 1/6 systolic ejection murmur Abdominal exam nontender or distended. No masses palpated. Extremities show 2+ edema. neuro grossly intact  ECG Sinus rhythm at a rate of 83. Low voltage. Nonspecific ST changes. Prolonged QT interval.

## 2014-02-26 ENCOUNTER — Encounter: Payer: Self-pay | Admitting: Cardiology

## 2014-02-26 ENCOUNTER — Ambulatory Visit (INDEPENDENT_AMBULATORY_CARE_PROVIDER_SITE_OTHER): Payer: Medicare Other | Admitting: Cardiology

## 2014-02-26 VITALS — BP 164/68 | HR 83 | Ht 68.0 in | Wt 153.1 lb

## 2014-02-26 DIAGNOSIS — I5032 Chronic diastolic (congestive) heart failure: Secondary | ICD-10-CM

## 2014-02-26 DIAGNOSIS — I480 Paroxysmal atrial fibrillation: Secondary | ICD-10-CM

## 2014-02-26 DIAGNOSIS — I10 Essential (primary) hypertension: Secondary | ICD-10-CM

## 2014-02-26 MED ORDER — TORSEMIDE 20 MG PO TABS: 40.0000 mg | ORAL_TABLET | Freq: Every day | ORAL | Status: DC

## 2014-02-26 MED ORDER — ASPIRIN EC 81 MG PO TBEC: 81.0000 mg | DELAYED_RELEASE_TABLET | Freq: Every day | ORAL | Status: DC

## 2014-02-26 NOTE — Patient Instructions (Signed)
Your physician wants you to follow-up in: [address] [place] will receive a reminder letter in the mail two months in advance. If you don't receive a letter, please call our office to schedule the follow-up appointment.   STOP WARFARIN  START ASPIRIN 61 MG ONCE DAILY  INCREASE TORSEMIDE TO 40 MG ONCE DAILY= 2 OF THE 20 MG TABLETS ONCE DAILY  Your physician recommends that you return for lab work in: [address] [place] [address]  A chest x-ray takes a picture of the organs and structures inside the chest, including the heart, lungs, and blood vessels. This test can show several things, including, whether the heart is enlarges; whether fluid is building up in the lungs; and whether pacemaker / defibrillator leads are still in place. AT [facility] [address] [facility]

## 2014-02-26 NOTE — Assessment & Plan Note (Addendum)
Patient remains in sinus rhythm. Continue amiodarone. Check TSH, liver functions and chest x-ray. Check hemoglobin. She has fallen 3 times in the past 1 month. I now feel the risk of Coumadin outweighs the benefit. We will discontinue and begin aspirin 81 mg daily. She understands the higher risk of stroke with aspirin but with recurrent falls I think the risk of Coumadin outweighs the benefit.

## 2014-02-26 NOTE — Assessment & Plan Note (Addendum)
Blood pressure elevated. Increase Demadex to 40 mg daily. Check potassium and renal function in 1 week. Continue remaining medicines.

## 2014-02-26 NOTE — Assessment & Plan Note (Addendum)
Patient is mildly volume overloaded on examination. Increase Demadex to 40 mg daily. Check potassium and renal function in 1 week.

## 2014-02-27 ENCOUNTER — Telehealth: Payer: Self-pay | Admitting: Cardiology

## 2014-02-27 NOTE — Telephone Encounter (Signed)
Spoke with pt, explained with her 3 falls in the last month the risk of her getting a serious injury from a fall is as much a concern as stroke. Patient voiced understanding and is going to start aspirin.

## 2014-02-27 NOTE — Telephone Encounter (Signed)
New message      Talk to the nurse.  Dr Stanford Breed recommended she stop her coumadin.  She want to talk about that.

## 2014-03-16 ENCOUNTER — Emergency Department: Payer: Self-pay | Admitting: Emergency Medicine

## 2014-03-17 ENCOUNTER — Encounter: Payer: Self-pay | Admitting: Surgery

## 2014-03-31 ENCOUNTER — Ambulatory Visit (INDEPENDENT_AMBULATORY_CARE_PROVIDER_SITE_OTHER)
Admission: RE | Admit: 2014-03-31 | Discharge: 2014-03-31 | Disposition: A | Discharge status: Home / Self Care | Payer: Medicare Other | Source: Ambulatory Visit | Attending: Cardiology | Admitting: Cardiology

## 2014-03-31 ENCOUNTER — Other Ambulatory Visit (INDEPENDENT_AMBULATORY_CARE_PROVIDER_SITE_OTHER): Payer: Medicare Other

## 2014-03-31 DIAGNOSIS — I5032 Chronic diastolic (congestive) heart failure: Secondary | ICD-10-CM

## 2014-03-31 DIAGNOSIS — I10 Essential (primary) hypertension: Secondary | ICD-10-CM

## 2014-03-31 DIAGNOSIS — I480 Paroxysmal atrial fibrillation: Secondary | ICD-10-CM

## 2014-03-31 LAB — BASIC METABOLIC PANEL
BUN: 14 mg/dL (ref 6–23)
CHLORIDE: 105 meq/L (ref 96–112)
CO2: 27 meq/L (ref 19–32)
Calcium: 8.6 mg/dL (ref 8.4–10.5)
Creat: 1.26 mg/dL — ABNORMAL HIGH (ref 0.50–1.10)
Glucose, Bld: 94 mg/dL (ref 70–99)
POTASSIUM: 4 meq/L (ref 3.5–5.3)
SODIUM: 142 meq/L (ref 135–145)

## 2014-03-31 LAB — CBC WITH DIFFERENTIAL/PLATELET
Basophils Absolute: 0.1 10*3/uL (ref 0.0–0.1)
Basophils Relative: 0.5 % (ref 0.0–3.0)
Eosinophils Absolute: 0.1 10*3/uL (ref 0.0–0.7)
Eosinophils Relative: 1.4 % (ref 0.0–5.0)
HEMATOCRIT: 36.8 % (ref 36.0–46.0)
Hemoglobin: 12.1 g/dL (ref 12.0–15.0)
Lymphocytes Relative: 14.5 % (ref 12.0–46.0)
Lymphs Abs: 1.4 10*3/uL (ref 0.7–4.0)
MCHC: 32.9 g/dL (ref 30.0–36.0)
MCV: 97.1 fl (ref 78.0–100.0)
MONO ABS: 1 10*3/uL (ref 0.1–1.0)
MONOS PCT: 10.7 % (ref 3.0–12.0)
NEUTROS PCT: 72.9 % (ref 43.0–77.0)
Neutro Abs: 7 10*3/uL (ref 1.4–7.7)
PLATELETS: 380 10*3/uL (ref 150.0–400.0)
RBC: 3.79 Mil/uL — ABNORMAL LOW (ref 3.87–5.11)
RDW: 15.1 % (ref 11.5–15.5)
WBC: 9.5 10*3/uL (ref 4.0–10.5)

## 2014-03-31 LAB — HEPATIC FUNCTION PANEL
ALBUMIN: 3.3 g/dL — AB (ref 3.5–5.2)
ALK PHOS: 78 U/L (ref 39–117)
ALT: 18 U/L (ref 0–35)
AST: 20 U/L (ref 0–37)
Bilirubin, Direct: 0.1 mg/dL (ref 0.0–0.3)
TOTAL PROTEIN: 6.4 g/dL (ref 6.0–8.3)
Total Bilirubin: 0.5 mg/dL (ref 0.2–1.2)

## 2014-03-31 LAB — TSH: TSH: 24.56 u[IU]/mL — AB (ref 0.35–4.50)

## 2014-04-01 ENCOUNTER — Other Ambulatory Visit: Payer: Self-pay | Admitting: *Deleted

## 2014-04-01 DIAGNOSIS — I5032 Chronic diastolic (congestive) heart failure: Secondary | ICD-10-CM

## 2014-04-08 ENCOUNTER — Other Ambulatory Visit (INDEPENDENT_AMBULATORY_CARE_PROVIDER_SITE_OTHER): Payer: Medicare Other

## 2014-04-08 ENCOUNTER — Other Ambulatory Visit: Payer: Self-pay | Admitting: *Deleted

## 2014-04-08 DIAGNOSIS — R609 Edema, unspecified: Secondary | ICD-10-CM

## 2014-04-08 LAB — BASIC METABOLIC PANEL
BUN: 26 mg/dL — ABNORMAL HIGH (ref 6–23)
CALCIUM: 8.9 mg/dL (ref 8.4–10.5)
CO2: 28 meq/L (ref 19–32)
Chloride: 104 mEq/L (ref 96–112)
Creatinine, Ser: 1.5 mg/dL — ABNORMAL HIGH (ref 0.4–1.2)
GFR: 36.14 mL/min — ABNORMAL LOW (ref >60.00)
Glucose, Bld: 79 mg/dL (ref 70–99)
Potassium: 3.9 mEq/L (ref 3.5–5.1)
SODIUM: 139 meq/L (ref 135–145)

## 2014-04-17 ENCOUNTER — Encounter: Payer: Self-pay | Admitting: Surgery

## 2014-04-22 ENCOUNTER — Ambulatory Visit: Payer: Self-pay

## 2014-05-18 ENCOUNTER — Ambulatory Visit: Payer: Self-pay | Admitting: Specialist

## 2014-05-18 LAB — GLUCOSE, SEROUS FLUID: GLUCOSE, BODY FLUID: 101 mg/dL

## 2014-05-18 LAB — LACTATE DEHYDROGENASE, PLEURAL OR PERITONEAL FLUID: LDH, BODY FLUID: 78 U/L

## 2014-05-19 LAB — BODY FLUID CELL COUNT WITH DIFFERENTIAL
BASOS ABS: 0 %
Eosinophil: 0 %
LYMPHS PCT: 61 %
NEUTROS PCT: 7 %
NUCLEATED CELL COUNT: 47 /mm3
OTHER MONONUCLEAR CELLS: 32 %
Other Cells BF: 0 %

## 2014-05-22 LAB — BODY FLUID CULTURE

## 2014-05-23 NOTE — Progress Notes (Signed)
HPI: FU atrial fibrillation. Myoview in May of 2012 showed an ejection fraction of 73% and normal perfusion. Patient underwent elective DCCV on 08/26/10. Patient developed recurrent atrial fibrillation and amiodarone added. Patient had successful cardioversion on 11/10/2011. Echo repeated in 4/15 and showed normal LV function; grade 2 diastolic dysfunction, mild LAE and mild MR/AI. Coumadin DCed at last OV due to recurrent falls. TSH elevated 12/15. Since last seen, she has had to have a thoracentesis for a pleural effusion. Apparently cytology is negative with final cultures pending. She was also placed on higher dose diuretics including metolazone. She now denies dyspnea, chest pain, palpitations or syncope. Her pedal edema has improved.  Current Outpatient Prescriptions  Medication Sig Dispense Refill  . amiodarone (PACERONE) 200 MG tablet Take 1 tablet (200 mg total) by mouth daily. 90 tablet 3  . anastrozole (ARIMIDEX) 1 MG tablet Take 1 tablet by mouth Daily.    . Ascorbic Acid (VITAMIN C PO) Take 1 tablet by mouth daily.    Marland Kitchen aspirin EC 81 MG tablet Take 1 tablet (81 mg total) by mouth daily. 90 tablet 3  . calcium-vitamin D (OSCAL WITH D) 500-200 MG-UNIT per tablet Take 1 tablet by mouth daily with breakfast.    . celecoxib (CELEBREX) 100 MG capsule   3  . Cholecalciferol (VITAMIN D-3 PO) Take 1 tablet by mouth daily.    Marland Kitchen diltiazem (CARDIZEM SR) 90 MG 12 hr capsule Take 90 mg by mouth daily.     Marland Kitchen doxycycline (VIBRA-TABS) 100 MG tablet   0  . HYDROcodone-acetaminophen (NORCO/VICODIN) 5-325 MG per tablet Take 1 tablet by mouth every 6 (six) hours as needed.     . Iron-Vitamin C 65-125 MG TABS Take 1 tablet by mouth daily.    Marland Kitchen levothyroxine (SYNTHROID, LEVOTHROID) 150 MCG tablet Take 1 tablet by mouth daily.    Marland Kitchen lisinopril (PRINIVIL,ZESTRIL) 10 MG tablet Take 1 tablet by mouth daily.    . metoprolol (LOPRESSOR) 100 MG tablet Take 0.5 tablets (50 mg total) by mouth 2 (two) times  daily.    . Multiple Vitamin (MULTIVITAMIN) tablet Take 1 tablet by mouth daily.      Marland Kitchen omeprazole (PRILOSEC) 20 MG capsule Take 20 mg by mouth daily.    . potassium chloride (KLOR-CON) 10 MEQ CR tablet Take 20 mEq by mouth daily. PT TAKES TWO EXTRA TABLETS WITH EXTRA FLUID PILL    . pravastatin (PRAVACHOL) 40 MG tablet Take 40 mg by mouth at bedtime.     . torsemide (DEMADEX) 10 MG tablet Take 1 tablet (10 mg total) by mouth daily. 90 tablet 3  . torsemide (DEMADEX) 20 MG tablet Take 2 tablets (40 mg total) by mouth daily. TAKE EXTRA 20 MG AS NEEDED 180 tablet 3  . venlafaxine (EFFEXOR-XR) 75 MG 24 hr capsule Take 75 mg by mouth daily.       No current facility-administered medications for this visit.     Past Medical History  Diagnosis Date  . Hypertension   . Hyperlipidemia   . Hypothyroid   . Atrial fibrillation     Echocardiogram 5/12: normal LV function, moderate LAE, mild RAE, mild AI and MR and mild-to-moderate TR.  Myoview 5/ 12 showed an ejection fraction of 73% and normal perfusion.   Patient underwent elective DCCV on 08/26/10    Past Surgical History  Procedure Laterality Date  . Abdominal hysterectomy    . Back surgery    . Total knee arthroplasty    .  Tmj arthroplasty    . Tonsillectomy    . Appendectomy    . Cardioversion  11/10/2011    Procedure: CARDIOVERSION;  Surgeon: Lelon Perla, MD;  Location: [facility] OR;  Service: Cardiovascular;  Laterality: N/A;    History   Social History  . Marital Status: Married    Spouse Name: N/A    Number of Children: 2  . Years of Education: N/A   Occupational History  . Not on file.   Social History Main Topics  . Smoking status: Never Smoker   . Smokeless tobacco: Not on file  . Alcohol Use: No  . Drug Use: Not on file  . Sexual Activity: Not on file   Other Topics Concern  . Not on file   Social History Narrative    ROS: no fevers or chills, productive cough, hemoptysis, dysphasia, odynophagia, melena,  hematochezia, dysuria, hematuria, rash, seizure activity, orthopnea, PND, pedal edema, claudication. Remaining systems are negative.  Physical Exam: Well-developed well-nourished in no acute distress.  Skin is warm and dry.  HEENT is normal.  Neck is supple.  Chest is clear to auscultation with normal expansion.  Cardiovascular exam is regular rate and rhythm.  Abdominal exam nontender or distended. No masses palpated. Extremities show 1+ ankle edema. neuro grossly intact

## 2014-05-25 LAB — CULTURE, FUNGUS WITHOUT SMEAR

## 2014-05-26 ENCOUNTER — Encounter: Payer: Self-pay | Admitting: Cardiology

## 2014-05-26 ENCOUNTER — Ambulatory Visit (INDEPENDENT_AMBULATORY_CARE_PROVIDER_SITE_OTHER): Payer: 59 | Admitting: Cardiology

## 2014-05-26 VITALS — BP 146/52 | HR 56 | Ht 68.0 in | Wt 148.4 lb

## 2014-05-26 DIAGNOSIS — I5032 Chronic diastolic (congestive) heart failure: Secondary | ICD-10-CM

## 2014-05-26 DIAGNOSIS — N289 Disorder of kidney and ureter, unspecified: Secondary | ICD-10-CM

## 2014-05-26 DIAGNOSIS — I10 Essential (primary) hypertension: Secondary | ICD-10-CM

## 2014-05-26 DIAGNOSIS — I480 Paroxysmal atrial fibrillation: Secondary | ICD-10-CM

## 2014-05-26 NOTE — Patient Instructions (Signed)
Your physician recommends that you schedule a follow-up appointment in: [place]

## 2014-05-26 NOTE — Assessment & Plan Note (Signed)
Patient remains in sinus rhythm on examination. continue amiodarone. Continue aspirin. We discontinued Coumadin previously as she had fallen 3 times. We will consider resuming this in the future if she demonstrates better stability.

## 2014-05-26 NOTE — Assessment & Plan Note (Signed)
Follow renal function closely with diuresis.

## 2014-05-26 NOTE — Assessment & Plan Note (Signed)
Continue present blood pressure medications. 

## 2014-05-26 NOTE — Assessment & Plan Note (Signed)
Patient's volume status is improving. She is not clear about the dose of her Demadex. She will contact us with that. She will continue with that for now and she also takes metolazone weekly. I have asked her to take an additional Demadex daily for weight gain of 2-3 pounds. We discussed low sodium diet and fluid restriction. Follow renal function closely as she does have a history of renal insufficiency that worsens with diuresis.

## 2014-05-28 ENCOUNTER — Encounter: Payer: Self-pay | Admitting: *Deleted

## 2014-06-03 ENCOUNTER — Telehealth: Payer: Self-pay | Admitting: Cardiology

## 2014-06-03 NOTE — Telephone Encounter (Signed)
Received records from [facility] Dept for appointment with Dr Stanford Breed on 06/30/14.  Records given to [facility] (medical records) for Dr Jacalyn Lefevre schedule.  lp

## 2014-06-23 ENCOUNTER — Ambulatory Visit
Admit: 2014-06-23 | Disposition: A | Discharge status: Home / Self Care | Payer: Self-pay | Attending: Oncology | Admitting: Oncology

## 2014-06-28 NOTE — Progress Notes (Signed)
HPI: FU atrial fibrillation. Myoview in May of 2012 showed an ejection fraction of 73% and normal perfusion. Patient underwent elective DCCV on 08/26/10. Patient developed recurrent atrial fibrillation and amiodarone added. Patient had successful cardioversion on 11/10/2011. Echo repeated in 4/15 and showed normal LV function; grade 2 diastolic dysfunction, mild LAE and mild MR/AI. Coumadin DCed at previous OV due to recurrent falls. TSH elevated 12/15. Since last seen, she has mild dyspnea on exertion. No orthopnea, PND, palpitations or syncope. No exertional chest pain.  Current Outpatient Prescriptions  Medication Sig Dispense Refill  . amiodarone (PACERONE) 200 MG tablet Take 1 tablet (200 mg total) by mouth daily. 90 tablet 3  . anastrozole (ARIMIDEX) 1 MG tablet Take 1 tablet by mouth Daily.    . Ascorbic Acid (VITAMIN C PO) Take 1 tablet by mouth daily.    Marland Kitchen aspirin EC 81 MG tablet Take 1 tablet (81 mg total) by mouth daily. 90 tablet 3  . calcium-vitamin D (OSCAL WITH D) 500-200 MG-UNIT per tablet Take 1 tablet by mouth daily with breakfast.    . celecoxib (CELEBREX) 100 MG capsule   3  . Cholecalciferol (VITAMIN D-3 PO) Take 1 tablet by mouth daily.    Marland Kitchen diltiazem (CARDIZEM SR) 90 MG 12 hr capsule Take 90 mg by mouth daily.     Marland Kitchen doxycycline (VIBRA-TABS) 100 MG tablet   0  . Iron-Vitamin C 65-125 MG TABS Take 1 tablet by mouth daily.    Marland Kitchen levothyroxine (SYNTHROID, LEVOTHROID) 150 MCG tablet Take 1 tablet by mouth daily.    Marland Kitchen lisinopril (PRINIVIL,ZESTRIL) 10 MG tablet Take 1 tablet by mouth daily.    . metoprolol (LOPRESSOR) 100 MG tablet Take 0.5 tablets (50 mg total) by mouth 2 (two) times daily.    . Multiple Vitamin (MULTIVITAMIN) tablet Take 1 tablet by mouth daily.      Marland Kitchen omeprazole (PRILOSEC) 20 MG capsule Take 20 mg by mouth daily.    . potassium chloride (KLOR-CON) 10 MEQ CR tablet Take 20 mEq by mouth daily. PT TAKES TWO EXTRA TABLETS WITH EXTRA FLUID PILL    .  pravastatin (PRAVACHOL) 40 MG tablet Take 40 mg by mouth at bedtime.     . torsemide (DEMADEX) 10 MG tablet Take 1 tablet (10 mg total) by mouth daily. 90 tablet 3  . torsemide (DEMADEX) 20 MG tablet Take 2 tablets (40 mg total) by mouth daily. TAKE EXTRA 20 MG AS NEEDED 180 tablet 3  . venlafaxine (EFFEXOR-XR) 75 MG 24 hr capsule Take 75 mg by mouth daily.       No current facility-administered medications for this visit.     Past Medical History  Diagnosis Date  . Hypertension   . Hyperlipidemia   . Hypothyroid   . Atrial fibrillation     Echocardiogram 5/12: normal LV function, moderate LAE, mild RAE, mild AI and MR and mild-to-moderate TR.  Myoview 5/ 12 showed an ejection fraction of 73% and normal perfusion.   Patient underwent elective DCCV on 08/26/10    Past Surgical History  Procedure Laterality Date  . Abdominal hysterectomy    . Back surgery    . Total knee arthroplasty    . Tmj arthroplasty    . Tonsillectomy    . Appendectomy    . Cardioversion  11/10/2011    Procedure: CARDIOVERSION;  Surgeon: Lelon Perla, MD;  Location: [place];  Service: Cardiovascular;  Laterality: N/A;    History  Social History  . Marital Status: Married    Spouse Name: N/A  . Number of Children: 2  . Years of Education: N/A   Occupational History  . Not on file.   Social History Main Topics  . Smoking status: Never Smoker   . Smokeless tobacco: Not on file  . Alcohol Use: No  . Drug Use: Not on file  . Sexual Activity: Not on file   Other Topics Concern  . Not on file   Social History Narrative    ROS: possible claudication but no fevers or chills, productive cough, hemoptysis, dysphasia, odynophagia, melena, hematochezia, dysuria, hematuria, rash, seizure activity, orthopnea, PND, claudication. Remaining systems are negative.  Physical Exam: Well-developed well-nourished in no acute distress.  Skin is warm and dry.  HEENT is normal.  Neck is supple.  Chest is clear  to auscultation with normal expansion.  Cardiovascular exam is regular rate and rhythm.  Abdominal exam nontender or distended. No masses palpated. Extremities show trace edema. neuro grossly intact

## 2014-06-30 ENCOUNTER — Ambulatory Visit (INDEPENDENT_AMBULATORY_CARE_PROVIDER_SITE_OTHER): Payer: 59 | Admitting: Cardiology

## 2014-06-30 ENCOUNTER — Encounter: Payer: Self-pay | Admitting: Cardiology

## 2014-06-30 VITALS — BP 158/60 | HR 74 | Ht 68.0 in | Wt 147.0 lb

## 2014-06-30 DIAGNOSIS — I10 Essential (primary) hypertension: Secondary | ICD-10-CM

## 2014-06-30 DIAGNOSIS — I480 Paroxysmal atrial fibrillation: Secondary | ICD-10-CM

## 2014-06-30 DIAGNOSIS — I5032 Chronic diastolic (congestive) heart failure: Secondary | ICD-10-CM

## 2014-06-30 DIAGNOSIS — I739 Peripheral vascular disease, unspecified: Secondary | ICD-10-CM

## 2014-06-30 NOTE — Assessment & Plan Note (Signed)
Continue present dose of diuretics. Volume status appears to be stable.

## 2014-06-30 NOTE — Assessment & Plan Note (Signed)
Continue present blood pressure medications and follow. 

## 2014-06-30 NOTE — Assessment & Plan Note (Signed)
Possible claudication. Check ABIs with Doppler.

## 2014-06-30 NOTE — Assessment & Plan Note (Signed)
Patient remains in sinus rhythm on examination. Continue amiodarone. She has fallen multiple times and I think the risk of Coumadin outweighs the benefit. Continue aspirin.

## 2014-06-30 NOTE — Patient Instructions (Signed)
Your physician recommends that you schedule a follow-up appointment in: [place] has requested that you have a lower extremity arterial duplex. During this test, ultrasound are used to evaluate arterial blood flow in the legs. Allow one hour for this exam. There are no restrictions or special instructions.

## 2014-07-02 ENCOUNTER — Telehealth (HOSPITAL_COMMUNITY): Payer: Self-pay | Admitting: *Deleted

## 2014-07-07 ENCOUNTER — Other Ambulatory Visit: Payer: Self-pay | Admitting: *Deleted

## 2014-07-07 ENCOUNTER — Ambulatory Visit (HOSPITAL_COMMUNITY)
Admission: RE | Admit: 2014-07-07 | Discharge: 2014-07-07 | Disposition: A | Discharge status: Home / Self Care | Payer: Medicare PPO | Source: Ambulatory Visit | Attending: Cardiovascular Disease | Admitting: Cardiovascular Disease

## 2014-07-07 DIAGNOSIS — I739 Peripheral vascular disease, unspecified: Secondary | ICD-10-CM

## 2014-07-07 NOTE — Progress Notes (Signed)
Arterial Duplex Lower Ext. Completed. Pearley Millington, BS, RDMS, RVT  

## 2014-07-17 ENCOUNTER — Ambulatory Visit
Admit: 2014-07-17 | Disposition: A | Discharge status: Home / Self Care | Payer: Self-pay | Attending: Oncology | Admitting: Oncology

## 2014-08-04 NOTE — Consult Note (Signed)
PATIENT NAME:  [name], [name] [facility] MR#:  [id number] DATE OF BIRTH:  [birth year]  DATE OF CONSULTATION:  01/02/2012  REFERRING PHYSICIAN:  Loreli Dollar, MD CONSULTING PHYSICIAN:  Rusty Aus, MD  HISTORY: A 81 year old who presents with rapid atrial fibrillation. I was consulted by Dr. Rochel Brome for mild hypotension with atrial fibrillation this afternoon. She is one day postoperative mastectomy for breast CA. She has had off and on atrial fibrillation four years now and typically takes metoprolol tartrate 50 mg 3 times a day. This afternoon she started having minimal lightheadedness, but heart rate up into the 120s. Her systolic blood pressure went from 140 down to 96. She notes no chest pain. No anginal equivalents. She is eating lunch. No problem. No abdominal pain.   PAST MEDICAL HISTORY:  1. Atrial fibrillation. 2. Breast cancer. 3. Hypertension.   MEDICATIONS: 1. Celebrex 200 mg daily.  2. Synthroid 150 mcg daily.  3. Omeprazole 20 mg daily.  4. Effexor-XR 75 mg daily.  5. Klor-Con 10 milliequivalents daily.  6. Metoprolol tartrate 50 mg t.i.d.  7. Diltiazem 90 mg t.i.d.  8. Pradaxa 150 mg b.i.d.  9. Pravastatin 40 mg at bedtime.   SOCIAL HISTORY: Married, retired from [employer].   FAMILY HISTORY: Noncontributory. No smoking or alcohol.   REVIEW OF SYSTEMS: As noted above, otherwise negative.   PHYSICAL EXAMINATION:  VITAL SIGNS: Blood pressure 94/60, pulse 120, irregular.   NECK: No thyromegaly or bruits.   LUNGS: Clear.   HEART: Irregular rhythm. No murmur.   ABDOMEN: Soft, nontender.   EXTREMITIES: No edema.   LABS: Within normal limits.   ASSESSMENT AND PLAN:  Atrial fibrillation. We will increase IV fluid rate to 100. Give her metoprolol tartrate 50 mg p.o. x1. As her heart rate comes down and her cardiac output will increase, her blood pressure should increase. Hopefully with these changes she will go home later in the day. Overall doing well from a  postoperative state.   ____________________________ Rusty Aus, MD mfm:ap D: 01/02/2012 12:52:37 ET T: 01/02/2012 14:08:54 ET JOB#: [id number]  cc: Rusty Aus, MD, <Dictator> J. Rochel Brome, MD  [place] MD ELECTRONICALLY SIGNED 01/05/2012 8:00

## 2014-08-04 NOTE — Op Note (Signed)
PATIENT NAME:  [name], [name] [facility] MR#:  [id number] DATE OF BIRTH:  27-Jan-1933  DATE OF PROCEDURE:  01/01/2012  PREOPERATIVE DIAGNOSIS: Carcinoma of the right breast.   POSTOPERATIVE DIAGNOSIS: Carcinoma of the right breast.   PROCEDURE: Right mastectomy with sentinel lymph node biopsy.   SURGEON: Loreli Dollar, MD  ANESTHESIA: General.   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 Recently had mammogram depicting a focus of microcalcifications in the upper outer quadrant right breast. Stereotactic needle biopsy demonstrated ductal carcinoma in situ and mastectomy was recommended for definitive treatment and also in view of the mastectomy elected to do a sentinel lymph node biopsy after consultation with her anticipated oncologist in case there was any findings of infiltrating cancer.   DESCRIPTION OF PROCEDURE: Patient did have preoperative injection of technetium sulfur colloid. She was placed on the operating table in the supine position under general anesthesia. The right arm was placed on a lateral arm rest. The breast was prepared with ChloraPrep and draped in a sterile manner.   Curvilinear incisions were made in the upper and lower aspect of the breast. The incision was oriented from inferior medial to superior lateral. The upper incision was above the areola and the lower incision was below the areola. Silk sutures were used for traction. Skin and subcutaneous flaps were raised superiorly in the direction of the clavicle, medially towards the sternum, inferiorly to the inframammary fold and laterally towards the latissimus dorsi muscle.   The gamma counter was used to probe the axilla identifying the site of the sentinel lymph node and dissection was carried down deep within the axilla adjacent to the chest wall where a lymph node was encountered with radioactivity. This was  approximately 6 mm in dimension and was dissected free from surrounding structures including some fatty tissue with the resection. The ex vivo count was greater than 500 counts per second. This was submitted for frozen section and routine pathology. The background count was less than 16. There was no grossly palpable mass within the axilla.   Next, the mastectomy was continued as the breast was dissected off the underlying deep fascia and muscle using electrocautery for hemostasis. The upper outer end of the skin ellipse was tagged with a stitch for the pathologist's orientation. The axillary tail of the breast was included in the mastectomy and the mastectomy was sent fresh for routine pathology. The wound was inspected and hemostasis appeared to be intact. Two 15 French Blake drains were inserted through separate inferior stab wounds, cut to fit and one was placed up into the axilla and one along the anterior chest wall and the drains were sutured to the skin with 3-0 silk. The wound was closed with running 4-0 Monocryl subcuticular suture and Dermabond. The patient tolerated surgery satisfactorily and the drains were activated. There was scant serosanguineous drainage and the patient was then prepared for transfer to the recovery room.   ____________________________ Lenna Sciara. Rochel Brome, MD jws:cms D: 01/01/2012 13:10:45 ET T: 01/01/2012 13:20:36 ET JOB#: [id number]  cc: Loreli Dollar, MD, <Dictator>  Loreli Dollar MD ELECTRONICALLY SIGNED 01/04/2012 18:23

## 2014-08-07 NOTE — Op Note (Signed)
PATIENT NAME:  [name], [name] [facility] MR#:  [id number] DATE OF BIRTH:  [birth year]  DATE OF PROCEDURE:  09/27/2012  PREOPERATIVE DIAGNOSIS: Necrotic left ankle wound.   POSTOPERATIVE DIAGNOSIS: Necrotic infected hematoma, left ankle.   PROCEDURE PERFORMED: Excisional debridement of left ankle wound/hematoma.   SURGEON: Hortencia Pilar, MD  ANESTHESIA: General by LMA.   FLUIDS: Per anesthesia record.   ESTIMATED BLOOD LOSS: Minimal.   SPECIMEN: Culture of the deeper tissues for aerobic and anaerobic growth. Necrotic debrided skin, soft tissue and hematoma was not sent for permanent evaluation.   INDICATIONS: [name] is a 81 year old woman who presented to the office with a wound on the ankle. It did appear somewhat inflamed and there did appear to be a necrotic cap of skin.  Subsequently, she was started on an antibiotic, but this did not improve and therefore she is now undergoing debridement with placement of a VAC wound dressing.   DESCRIPTION OF PROCEDURE: The patient is taken to the operating room and placed in the supine position. After adequate anesthesia is induced and appropriate invasive monitors are placed, the left ankle and foot are prepped and draped in sterile fashion.   0.25% Marcaine with epinephrine is then infiltrated into the soft tissue surrounding the entire ulcer. Using a 15 blade scalpel full thickness skin and subcutaneous tissues are then debrided. Circumferential incision is created with the 15 blade scalpel and the tissues are then excised down to the fascia. Fascia appears healthy with good vascularity. Upon entering the wound proper, it appears that the central portion is old coagulated hematoma and that the mechanism for this ulceration was likely hematoma formation from a minor trauma which subsequently caused pressure necrosis of the overlying skin. The debrided tissues are skin, full-thickness, and soft tissues above the fascia. The fascia was not debrided. With the wound  debrided cultures along the deep fascial edge are then obtained and passed off the field to be sent to micro.   Hemostasis is then obtained with Bovie cautery and Mepitel followed by a VAC sponge and a VAC wound dressing is then placed. The patient tolerated the procedure well. There were no immediate complications. Sponge and needle counts were correct. She is taken to the recovery room in stable condition. ____________________________ Katha Cabal, MD ggs:sb D: 09/27/2012 13:35:06 ET T: 09/27/2012 15:23:47 ET JOB#: [id number]  cc: Katha Cabal, MD, <Dictator> Rusty Aus, MD Katha Cabal MD ELECTRONICALLY SIGNED 10/02/2012 16:08

## 2014-08-10 ENCOUNTER — Encounter: Payer: Self-pay | Admitting: Cardiovascular Disease

## 2014-08-10 ENCOUNTER — Ambulatory Visit (INDEPENDENT_AMBULATORY_CARE_PROVIDER_SITE_OTHER): Payer: Medicare PPO | Admitting: Cardiovascular Disease

## 2014-08-10 VITALS — BP 134/50 | HR 52 | Ht 68.0 in | Wt 142.8 lb

## 2014-08-10 DIAGNOSIS — I4891 Unspecified atrial fibrillation: Secondary | ICD-10-CM

## 2014-08-10 DIAGNOSIS — I739 Peripheral vascular disease, unspecified: Secondary | ICD-10-CM | POA: Diagnosis not present

## 2014-08-10 NOTE — Patient Instructions (Signed)
You are scheduled for a Abdominal Aortic Lower Extremity with Run Off on May 4th with Dr. Fletcher Anon or associate.  Go to [facility] 2nd Floor Short Stay on Wednesday, May 4th at 6:30 am.  Enter thru the [employer] entrance A No food or drink after midnight on Tuesday, May 3rd. You may take your medications with a sip of water on the day of your procedure. Hold torsemide the am of procedure.  Medication Instructions:  Stop Diltiazem  Labwork: Bmet/Cbc/Pt/Inr  Testing/Procedures: Abdominal Aortic Lower Extremity with Run Off  Follow-Up: Will let you know after procedure  Any Other Special Instructions Will Be Listed Below (If Applicable).

## 2014-08-10 NOTE — Assessment & Plan Note (Signed)
She is having increased dizziness with mild bradycardia noted. Due to this and lower extremity edema, I discontinued small dose of diltiazem. She is already on metoprolol and amiodarone for atrial fibrillation.

## 2014-08-10 NOTE — Assessment & Plan Note (Signed)
The patient reports severe bilateral leg pain with activities worse on the right side. She clearly has a component of claudication. However, I suspect that she has other etiologies also causing some of her thigh pain which cannot be explained by peripheral arterial disease given that there is no evidence of inflow disease. Arthritis is likely contributing to some of her symptoms. The most concerning issue is the slow healing ulcer affecting the right lateral ankle. Noninvasive evaluation showed an ABI of 0.67 on the right and 0.85 on the left. There was no evidence of significant iliac disease. There was greater than 50% stenosis affecting the right SFA with 1 vessel runoff via the peroneal artery. There was diffuse nonobstructive disease affecting the left SFA and popliteal artery with 2 vessel runoff on the left. Due to the above, I think the best option is to proceed with abdominal aortogram, lower extremity runoff and possible endovascular intervention with focus on the right leg. Plan access is via the left femoral artery. I discussed the risks and benefits of the procedure.

## 2014-08-10 NOTE — Progress Notes (Signed)
Primary care physician: Dr. Sabra Heck Primary cardiologist: Dr. Stanford Breed  HPI  This is a pleasant 81 year old female who was referred by Dr. Stanford Breed for evaluation and management of peripheral arterial disease. She has known history of atrial fibrillation with previous cardioversion in 2012. She has been maintaining in sinus rhythm with amiodarone. She has no history of coronary artery disease with previous nuclear stress test in 2012. Anticoagulation with warfarin was stopped due to frequent falls. The patient reports prolonged history of bilateral leg pain which has worsened recently. She reports pain and weakness in both thighs and calves which is worse on the right side. She had previous right knee replacements with some improvement in her symptoms. She continues to have significant osteoarthritis affecting the left knee. She has chronic ulceration on the right lateral ankle for few years which has required wound care. This has not healed completely. She had previous wounds in the left leg which has healed without problems. She also has chronic leg edema and discoloration. She feels extremely limited by her leg pain especially on the right side. She has been having increased dizziness and is noted to be mildly bradycardic today.  Allergies  Allergen Reactions  . Amlodipine   . Ciprofloxacin Nausea Only  . Doxycycline Itching and Other (See Comments)    shaky  . Lipitor [Atorvastatin]     Joint pain   . Penicillins   . Macrobid [Nitrofurantoin Macrocrystal] Rash     Current Outpatient Prescriptions on File Prior to Visit  Medication Sig Dispense Refill  . amiodarone (PACERONE) 200 MG tablet Take 1 tablet (200 mg total) by mouth daily. 90 tablet 3  . anastrozole (ARIMIDEX) 1 MG tablet Take 1 tablet by mouth Daily.    . Ascorbic Acid (VITAMIN C PO) Take 1 tablet by mouth daily.    Marland Kitchen aspirin EC 81 MG tablet Take 1 tablet (81 mg total) by mouth daily. 90 tablet 3  . calcium-vitamin D  (OSCAL WITH D) 500-200 MG-UNIT per tablet Take 1 tablet by mouth daily with breakfast.    . celecoxib (CELEBREX) 100 MG capsule   3  . Cholecalciferol (VITAMIN D-3 PO) Take 1 tablet by mouth daily.    . Iron-Vitamin C 65-125 MG TABS Take 1 tablet by mouth daily.    Marland Kitchen levothyroxine (SYNTHROID, LEVOTHROID) 150 MCG tablet Take 1 tablet by mouth daily.    Marland Kitchen lisinopril (PRINIVIL,ZESTRIL) 10 MG tablet Take 1 tablet by mouth daily.    . metoprolol (LOPRESSOR) 100 MG tablet Take 0.5 tablets (50 mg total) by mouth 2 (two) times daily. (Patient taking differently: Take 100 mg by mouth 2 (two) times daily. )    . Multiple Vitamin (MULTIVITAMIN) tablet Take 1 tablet by mouth daily.      Marland Kitchen omeprazole (PRILOSEC) 20 MG capsule Take 20 mg by mouth daily.    . potassium chloride (KLOR-CON) 10 MEQ CR tablet Take 20 mEq by mouth daily. PT TAKES TWO EXTRA TABLETS WITH EXTRA FLUID PILL    . pravastatin (PRAVACHOL) 40 MG tablet Take 40 mg by mouth at bedtime.     . torsemide (DEMADEX) 20 MG tablet Take 2 tablets (40 mg total) by mouth daily. TAKE EXTRA 20 MG AS NEEDED 180 tablet 3  . venlafaxine (EFFEXOR-XR) 75 MG 24 hr capsule Take 75 mg by mouth daily.       No current facility-administered medications on file prior to visit.     Past Medical History  Diagnosis Date  . Hypertension   .  Hyperlipidemia   . Hypothyroid   . Atrial fibrillation     Echocardiogram 5/12: normal LV function, moderate LAE, mild RAE, mild AI and MR and mild-to-moderate TR.  Myoview 5/ 12 showed an ejection fraction of 73% and normal perfusion.   Patient underwent elective DCCV on 08/26/10     Past Surgical History  Procedure Laterality Date  . Abdominal hysterectomy    . Back surgery    . Total knee arthroplasty    . Tmj arthroplasty    . Tonsillectomy    . Appendectomy    . Cardioversion  11/10/2011    Procedure: CARDIOVERSION;  Surgeon: Lelon Perla, MD;  Location: [facility] OR;  Service: Cardiovascular;  Laterality: N/A;      Family History  Problem Relation Age of Onset  . Coronary artery disease Sister 42    MI  . Heart attack Father      History   Social History  . Marital Status: Married    Spouse Name: N/A  . Number of Children: 2  . Years of Education: N/A   Occupational History  . Not on file.   Social History Main Topics  . Smoking status: Never Smoker   . Smokeless tobacco: Not on file  . Alcohol Use: No  . Drug Use: Not on file  . Sexual Activity: Not on file   Other Topics Concern  . Not on file   Social History Narrative     ROS A 10 point review of system was performed. It is negative other than that mentioned in the history of present illness.   PHYSICAL EXAM   BP 134/50 mmHg  Pulse 52  Ht 5\' 8"  (1.727 m)  Wt 142 lb 12 oz (64.751 kg)  BMI 21.71 kg/m2 Constitutional: She is oriented to person, place, and time. She appears well-developed and well-nourished. No distress.  HENT: No nasal discharge.  Head: Normocephalic and atraumatic.  Eyes: Pupils are equal and round. No discharge.  Neck: Normal range of motion. Neck supple. No JVD present. No thyromegaly present.  Cardiovascular: Normal rate, regular rhythm, normal heart sounds. Exam reveals no gallop and no friction rub. No murmur heard.  Pulmonary/Chest: Effort normal and breath sounds normal. No stridor. No respiratory distress. She has no wheezes. She has no rales. She exhibits no tenderness.  Abdominal: Soft. Bowel sounds are normal. She exhibits no distension. There is no tenderness. There is no rebound and no guarding.  Musculoskeletal: Normal range of motion. She exhibits mild edema and no tenderness.  Neurological: She is alert and oriented to person, place, and time. Coordination normal.  Skin: Skin is warm and dry. No rash noted. She is not diaphoretic. No erythema. No pallor.  Psychiatric: She has a normal mood and affect. Her behavior is normal. Judgment and thought content normal.  Vascular:  Femoral pulses are normal bilaterally. Distal pulses are not palpable. There is dependent rubor. There is a small ulceration on the right lateral ankle. There is chronic stasis dermatitis   [id number]  Bradycardia  WITHIN NORMAL LIMITS   ASSESSMENT AND PLAN

## 2014-08-11 LAB — BASIC METABOLIC PANEL WITH GFR
BUN/Creatinine Ratio: 21 (ref 11–26)
BUN: 30 mg/dL — ABNORMAL HIGH (ref 8–27)
CO2: 25 mmol/L (ref 18–29)
Calcium: 8.9 mg/dL (ref 8.7–10.3)
Chloride: 101 mmol/L (ref 97–108)
Creatinine, Ser: 1.45 mg/dL — ABNORMAL HIGH (ref 0.57–1.00)
GFR calc Af Amer: 40 mL/min/{1.73_m2} — ABNORMAL LOW
GFR calc non Af Amer: 34 mL/min/{1.73_m2} — ABNORMAL LOW
Glucose: 97 mg/dL (ref 65–99)
Potassium: 4.2 mmol/L (ref 3.5–5.2)
Sodium: 142 mmol/L (ref 134–144)

## 2014-08-11 LAB — CBC WITH DIFFERENTIAL/PLATELET
Basophils Absolute: 0 10*3/uL (ref 0.0–0.2)
Basos: 1 %
EOS (ABSOLUTE): 0.2 10*3/uL (ref 0.0–0.4)
Eos: 2 %
Hematocrit: 40.7 % (ref 34.0–46.6)
Hemoglobin: 13.3 g/dL (ref 11.1–15.9)
Immature Grans (Abs): 0 10*3/uL (ref 0.0–0.1)
Immature Granulocytes: 0 %
Lymphocytes Absolute: 1.6 10*3/uL (ref 0.7–3.1)
Lymphs: 24 %
MCH: 31.9 pg (ref 26.6–33.0)
MCHC: 32.7 g/dL (ref 31.5–35.7)
MCV: 98 fL — ABNORMAL HIGH (ref 79–97)
Monocytes Absolute: 0.9 10*3/uL (ref 0.1–0.9)
Monocytes: 13 %
Neutrophils Absolute: 3.9 10*3/uL (ref 1.4–7.0)
Neutrophils: 60 %
Platelets: 238 10*3/uL (ref 150–379)
RBC: 4.17 x10E6/uL (ref 3.77–5.28)
RDW: 15.6 % — ABNORMAL HIGH (ref 12.3–15.4)
WBC: 6.6 10*3/uL (ref 3.4–10.8)

## 2014-08-11 LAB — PROTIME-INR
INR: 1 (ref 0.8–1.2)
Prothrombin Time: 10.6 s (ref 9.1–12.0)

## 2014-08-19 ENCOUNTER — Encounter (HOSPITAL_COMMUNITY)
Admission: RE | Disposition: A | Discharge status: Home / Self Care | Payer: Medicare PPO | Source: Ambulatory Visit | Attending: Cardiovascular Disease

## 2014-08-19 ENCOUNTER — Encounter (HOSPITAL_COMMUNITY): Admission: RE | Payer: Self-pay | Source: Ambulatory Visit

## 2014-08-19 ENCOUNTER — Ambulatory Visit (HOSPITAL_COMMUNITY)
Admission: RE | Admit: 2014-08-19 | Discharge: 2014-08-19 | Disposition: A | Discharge status: Home / Self Care | Payer: Medicare PPO | Source: Ambulatory Visit | Attending: Cardiovascular Disease | Admitting: Cardiovascular Disease

## 2014-08-19 ENCOUNTER — Ambulatory Visit (HOSPITAL_COMMUNITY): Admission: RE | Admit: 2014-08-19 | Payer: Medicare PPO | Source: Ambulatory Visit | Admitting: Cardiovascular Disease

## 2014-08-19 DIAGNOSIS — I4891 Unspecified atrial fibrillation: Secondary | ICD-10-CM | POA: Diagnosis not present

## 2014-08-19 DIAGNOSIS — E039 Hypothyroidism, unspecified: Secondary | ICD-10-CM | POA: Insufficient documentation

## 2014-08-19 DIAGNOSIS — Z79811 Long term (current) use of aromatase inhibitors: Secondary | ICD-10-CM | POA: Insufficient documentation

## 2014-08-19 DIAGNOSIS — I10 Essential (primary) hypertension: Secondary | ICD-10-CM | POA: Diagnosis not present

## 2014-08-19 DIAGNOSIS — I739 Peripheral vascular disease, unspecified: Secondary | ICD-10-CM

## 2014-08-19 DIAGNOSIS — I748 Embolism and thrombosis of other arteries: Secondary | ICD-10-CM | POA: Insufficient documentation

## 2014-08-19 DIAGNOSIS — Z96651 Presence of right artificial knee joint: Secondary | ICD-10-CM | POA: Diagnosis not present

## 2014-08-19 DIAGNOSIS — Z791 Long term (current) use of non-steroidal anti-inflammatories (NSAID): Secondary | ICD-10-CM | POA: Insufficient documentation

## 2014-08-19 DIAGNOSIS — Z7982 Long term (current) use of aspirin: Secondary | ICD-10-CM | POA: Insufficient documentation

## 2014-08-19 DIAGNOSIS — E785 Hyperlipidemia, unspecified: Secondary | ICD-10-CM | POA: Diagnosis not present

## 2014-08-19 DIAGNOSIS — I70235 Atherosclerosis of native arteries of right leg with ulceration of other part of foot: Secondary | ICD-10-CM | POA: Insufficient documentation

## 2014-08-19 DIAGNOSIS — I70211 Atherosclerosis of native arteries of extremities with intermittent claudication, right leg: Secondary | ICD-10-CM | POA: Diagnosis not present

## 2014-08-19 DIAGNOSIS — L97519 Non-pressure chronic ulcer of other part of right foot with unspecified severity: Secondary | ICD-10-CM | POA: Insufficient documentation

## 2014-08-19 SURGERY — ABDOMINAL AORTAGRAM

## 2014-08-19 SURGERY — ABDOMINAL AORTOGRAM

## 2014-08-19 MED ORDER — ASPIRIN 81 MG PO CHEW
81.0000 mg | CHEWABLE_TABLET | ORAL | Status: AC
  Administered 2014-08-19: 81 mg via ORAL

## 2014-08-19 MED ORDER — HYDRALAZINE HCL 20 MG/ML IJ SOLN
10.0000 mg | Freq: Once | INTRAMUSCULAR | Status: AC
  Administered 2014-08-19: 10 mg via INTRAVENOUS

## 2014-08-19 MED ORDER — MIDAZOLAM HCL 2 MG/2ML IJ SOLN
INTRAMUSCULAR | Status: DC | PRN
  Administered 2014-08-19: 1 mg via INTRAVENOUS

## 2014-08-19 MED ORDER — MIDAZOLAM HCL 2 MG/2ML IJ SOLN
INTRAMUSCULAR | Status: AC
  Filled 2014-08-19: qty 2

## 2014-08-19 MED ORDER — SODIUM CHLORIDE 0.9 % IJ SOLN: 3.0000 mL | INTRAMUSCULAR | Status: DC | PRN

## 2014-08-19 MED ORDER — HYDRALAZINE HCL 20 MG/ML IJ SOLN
INTRAMUSCULAR | Status: DC | PRN
  Administered 2014-08-19: 10 mg via INTRAVENOUS

## 2014-08-19 MED ORDER — SODIUM CHLORIDE 0.9 % IV SOLN
INTRAVENOUS | Status: DC
  Administered 2014-08-19: 07:00:00 via INTRAVENOUS

## 2014-08-19 MED ORDER — HEPARIN (PORCINE) IN NACL 2-0.9 UNIT/ML-% IJ SOLN
INTRAMUSCULAR | Status: AC
  Filled 2014-08-19: qty 1000

## 2014-08-19 MED ORDER — SODIUM CHLORIDE 0.9 % IJ SOLN: 3.0000 mL | Freq: Two times a day (BID) | INTRAMUSCULAR | Status: DC

## 2014-08-19 MED ORDER — LIDOCAINE HCL (PF) 1 % IJ SOLN
INTRAMUSCULAR | Status: AC
  Filled 2014-08-19: qty 30

## 2014-08-19 MED ORDER — HYDRALAZINE HCL 20 MG/ML IJ SOLN
INTRAMUSCULAR | Status: AC
  Filled 2014-08-19: qty 1

## 2014-08-19 MED ORDER — IODIXANOL 320 MG/ML IV SOLN
INTRAVENOUS | Status: DC | PRN
  Administered 2014-08-19: 200 mL via INTRA_ARTERIAL

## 2014-08-19 MED ORDER — FENTANYL CITRATE (PF) 100 MCG/2ML IJ SOLN
INTRAMUSCULAR | Status: AC
  Filled 2014-08-19: qty 2

## 2014-08-19 MED ORDER — SODIUM CHLORIDE 0.9 % IV SOLN: 250.0000 mL | INTRAVENOUS | Status: DC | PRN

## 2014-08-19 MED ORDER — ASPIRIN 81 MG PO CHEW
CHEWABLE_TABLET | ORAL | Status: AC
Start: 2014-08-19 — End: 2014-08-19
  Administered 2014-08-19: 81 mg via ORAL
  Filled 2014-08-19: qty 1

## 2014-08-19 MED ORDER — SODIUM CHLORIDE 0.9 % WEIGHT BASED INFUSION: 1.0000 mL/kg/h | INTRAVENOUS | Status: AC

## 2014-08-19 SURGICAL SUPPLY — 10 items
CATH CROSS OVER TEMPO 5F (CATHETERS) ×1 IMPLANT
CATH STRAIGHT 5FR 65CM (CATHETERS) ×1 IMPLANT
HAND CONTROLLER AVANTA (MISCELLANEOUS) ×1 IMPLANT
KIT PV (KITS) ×1 IMPLANT
SET AVANTA SINGLE PATIENT (MISCELLANEOUS) ×1 IMPLANT
SHEATH AVANTA HAND CONTROLLER (MISCELLANEOUS) ×1 IMPLANT
SHEATH PINNACLE 5F 10CM (SHEATH) ×1 IMPLANT
TRANSDUCER W/STOPCOCK (MISCELLANEOUS) ×1 IMPLANT
TRAY PV CATH (CUSTOM PROCEDURE TRAY) ×1 IMPLANT
WIRE HITORQ VERSACORE ST 145CM (WIRE) ×1 IMPLANT

## 2014-08-19 NOTE — CV Procedure (Signed)
    PERIPHERAL VASCULAR PROCEDURE  NAME:  [name]   MRN: [id number] DOB:  08-Mar-1933   ADMIT DATE: 08/19/2014  Performing Cardiologist: Kathlyn Sacramento Primary Physician: Rusty Aus., MD Primary Cardiologist:  Dr. Stanford Breed  Procedures Performed:  Abdominal Aortic Angiogram without Bi-Iliofemoral Runoff  Second Order right Lower Extremity Angiography with Runoff   Indication(s):   Claudication with small ulcer on the right lateral side of the foot   Consent: The procedure with Risks/Benefits/Alternatives and Indications was reviewed with the patient .  All questions were answered.  Medications:  Sedation:  1 mg IV Versed, 25 mcg IV Fentanyl  Contrast:  80 ml  Visipaque   Procedural details: The left groin was prepped, draped, and anesthetized with 1% lidocaine. Using modified Seldinger technique, a 5 French sheath was introduced into the left common femoral artery. A 5 Fr Short Pigtail Catheter was advanced of over a  Versicore wire into the descending Aorta to a level just above the renal arteries. A power injection of 88ml/sec contrast over 1 sec was performed for Abdominal Aortic and iliac Angiography.    The pigtail catheter was changed over the Versicore wire for A crossover catheter which was then pulled back the aortic bifurcation and the wire was advanced down the contralateral common iliac artery.  The wire was then advanced to the contralateral common femoral artery, the catheter was exchanged into an end hole straight tip catheter which was advanced over the wire to the common femoral artery. Contralateral second-order lower extremity angiography was performed via power injection of 5 ml / sec contrast for a total of 35 ml. Due to knee prosthesis, lateral projections were performed.    Hemodynamics:  Central Aortic Pressure / Mean Aortic Pressure: 190/80 . 10 mg of hydralazine IV was given  Findings:  Abdominal aorta: Normal in size with no evidence of aneurysm or  atherosclerosis.  Left renal artery: Normal  Right renal artery: Normal  Celiac artery: Patent  Superior mesenteric artery: Patent  Right common iliac artery: Minor irregularities  Right internal iliac artery: Normal  Right external iliac artery: Normal  Right common femoral artery: Normal  Right profunda femoral artery: Normal  Right superficial femoral artery: Mild diffuse atherosclerosis in the proximal and midsegment. Distally, there is diffuse 40-50% disease extending into the popliteal artery   Right popliteal artery:  Diffuse 40% disease  One-vessel runoff below the knee via the peroneal artery which has 70% proximal stenosis. The posterior tibial artery is occluded with no clear reconstitution. The anterior tibial artery is occluded proximally in a short segment with reconstitution in multiple areas of moderate stenosis   Left common iliac artery:  Normal  Left internal iliac artery: Normal  Left external iliac artery: Normal   Conclusions: 1. No significant aortoiliac disease. 2. Moderate nonobstructive disease affecting the distal right SFA and popliteal artery. One-vessel runoff below the knee via the peroneal artery with short occlusion of the anterior tibial artery and reconstitution.  Recommendations:   Continue aggressive medical therapy. If the ulcer does not heal in few weeks, recommend angioplasty of the anterior tibial artery.   Kathlyn Sacramento, MD, [facility] 08/19/2014 9:48 AM

## 2014-08-19 NOTE — Discharge Instructions (Signed)

## 2014-08-19 NOTE — Interval H&P Note (Signed)
History and Physical Interval Note:  08/19/2014 8:55 AM  [name]  has presented today for surgery, with the diagnosis of pad  The various methods of treatment have been discussed with the patient and family. After consideration of risks, benefits and other options for treatment, the patient has consented to  Procedure(s): Abdominal Aortogram (N/A) as a surgical intervention .  The patient's history has been reviewed, patient examined, no change in status, stable for surgery.  I have reviewed the patient's chart and labs.  Questions were answered to the patient's satisfaction.     Kathlyn Sacramento

## 2014-08-19 NOTE — H&P (View-Only) (Signed)
Primary care physician: Dr. Sabra Heck Primary cardiologist: Dr. Stanford Breed  HPI  This is a pleasant 81 year old female who was referred by Dr. Stanford Breed for evaluation and management of peripheral arterial disease. She has known history of atrial fibrillation with previous cardioversion in 2012. She has been maintaining in sinus rhythm with amiodarone. She has no history of coronary artery disease with previous nuclear stress test in 2012. Anticoagulation with warfarin was stopped due to frequent falls. The patient reports prolonged history of bilateral leg pain which has worsened recently. She reports pain and weakness in both thighs and calves which is worse on the right side. She had previous right knee replacements with some improvement in her symptoms. She continues to have significant osteoarthritis affecting the left knee. She has chronic ulceration on the right lateral ankle for few years which has required wound care. This has not healed completely. She had previous wounds in the left leg which has healed without problems. She also has chronic leg edema and discoloration. She feels extremely limited by her leg pain especially on the right side. She has been having increased dizziness and is noted to be mildly bradycardic today.  Allergies  Allergen Reactions  . Amlodipine   . Ciprofloxacin Nausea Only  . Doxycycline Itching and Other (See Comments)    shaky  . Lipitor [Atorvastatin]     Joint pain   . Penicillins   . Macrobid [Nitrofurantoin Macrocrystal] Rash     Current Outpatient Prescriptions on File Prior to Visit  Medication Sig Dispense Refill  . amiodarone (PACERONE) 200 MG tablet Take 1 tablet (200 mg total) by mouth daily. 90 tablet 3  . anastrozole (ARIMIDEX) 1 MG tablet Take 1 tablet by mouth Daily.    . Ascorbic Acid (VITAMIN C PO) Take 1 tablet by mouth daily.    Marland Kitchen aspirin EC 81 MG tablet Take 1 tablet (81 mg total) by mouth daily. 90 tablet 3  . calcium-vitamin D  (OSCAL WITH D) 500-200 MG-UNIT per tablet Take 1 tablet by mouth daily with breakfast.    . celecoxib (CELEBREX) 100 MG capsule   3  . Cholecalciferol (VITAMIN D-3 PO) Take 1 tablet by mouth daily.    . Iron-Vitamin C 65-125 MG TABS Take 1 tablet by mouth daily.    Marland Kitchen levothyroxine (SYNTHROID, LEVOTHROID) 150 MCG tablet Take 1 tablet by mouth daily.    Marland Kitchen lisinopril (PRINIVIL,ZESTRIL) 10 MG tablet Take 1 tablet by mouth daily.    . metoprolol (LOPRESSOR) 100 MG tablet Take 0.5 tablets (50 mg total) by mouth 2 (two) times daily. (Patient taking differently: Take 100 mg by mouth 2 (two) times daily. )    . Multiple Vitamin (MULTIVITAMIN) tablet Take 1 tablet by mouth daily.      Marland Kitchen omeprazole (PRILOSEC) 20 MG capsule Take 20 mg by mouth daily.    . potassium chloride (KLOR-CON) 10 MEQ CR tablet Take 20 mEq by mouth daily. PT TAKES TWO EXTRA TABLETS WITH EXTRA FLUID PILL    . pravastatin (PRAVACHOL) 40 MG tablet Take 40 mg by mouth at bedtime.     . torsemide (DEMADEX) 20 MG tablet Take 2 tablets (40 mg total) by mouth daily. TAKE EXTRA 20 MG AS NEEDED 180 tablet 3  . venlafaxine (EFFEXOR-XR) 75 MG 24 hr capsule Take 75 mg by mouth daily.       No current facility-administered medications on file prior to visit.     Past Medical History  Diagnosis Date  . Hypertension   .  Hyperlipidemia   . Hypothyroid   . Atrial fibrillation     Echocardiogram 5/12: normal LV function, moderate LAE, mild RAE, mild AI and MR and mild-to-moderate TR.  Myoview 5/ 12 showed an ejection fraction of 73% and normal perfusion.   Patient underwent elective DCCV on 08/26/10     Past Surgical History  Procedure Laterality Date  . Abdominal hysterectomy    . Back surgery    . Total knee arthroplasty    . Tmj arthroplasty    . Tonsillectomy    . Appendectomy    . Cardioversion  11/10/2011    Procedure: CARDIOVERSION;  Surgeon: Lelon Perla, MD;  Location: [facility];  Service: Cardiovascular;  Laterality: N/A;      Family History  Problem Relation Age of Onset  . Coronary artery disease Sister 66    MI  . Heart attack Father      History   Social History  . Marital Status: Married    Spouse Name: N/A  . Number of Children: 2  . Years of Education: N/A   Occupational History  . Not on file.   Social History Main Topics  . Smoking status: Never Smoker   . Smokeless tobacco: Not on file  . Alcohol Use: No  . Drug Use: Not on file  . Sexual Activity: Not on file   Other Topics Concern  . Not on file   Social History Narrative     ROS A 10 point review of system was performed. It is negative other than that mentioned in the history of present illness.   PHYSICAL EXAM   BP 134/50 mmHg  Pulse 52  Ht 5\' 8"  (1.727 m)  Wt 142 lb 12 oz (64.751 kg)  BMI 21.71 kg/m2 Constitutional: She is oriented to person, place, and time. She appears well-developed and well-nourished. No distress.  HENT: No nasal discharge.  Head: Normocephalic and atraumatic.  Eyes: Pupils are equal and round. No discharge.  Neck: Normal range of motion. Neck supple. No JVD present. No thyromegaly present.  Cardiovascular: Normal rate, regular rhythm, normal heart sounds. Exam reveals no gallop and no friction rub. No murmur heard.  Pulmonary/Chest: Effort normal and breath sounds normal. No stridor. No respiratory distress. She has no wheezes. She has no rales. She exhibits no tenderness.  Abdominal: Soft. Bowel sounds are normal. She exhibits no distension. There is no tenderness. There is no rebound and no guarding.  Musculoskeletal: Normal range of motion. She exhibits mild edema and no tenderness.  Neurological: She is alert and oriented to person, place, and time. Coordination normal.  Skin: Skin is warm and dry. No rash noted. She is not diaphoretic. No erythema. No pallor.  Psychiatric: She has a normal mood and affect. Her behavior is normal. Judgment and thought content normal.  Vascular:  Femoral pulses are normal bilaterally. Distal pulses are not palpable. There is dependent rubor. There is a small ulceration on the right lateral ankle. There is chronic stasis dermatitis   [id number]  Bradycardia  WITHIN NORMAL LIMITS   ASSESSMENT AND PLAN

## 2014-08-19 NOTE — Progress Notes (Signed)
Site area: Lfa Site Prior to Removal:  Level 0 Pressure Applied For:52min Manual:   yes Patient Status During Pull:  stable Post Pull Site:  Level0 Post Pull Instructions Given:  yesPost Pull Pulses Present:doppler  Dressing Applied:  clear Bedrest begins @ 1100 Comments:

## 2014-09-07 ENCOUNTER — Encounter: Payer: Self-pay | Admitting: Cardiovascular Disease

## 2014-09-07 ENCOUNTER — Ambulatory Visit (INDEPENDENT_AMBULATORY_CARE_PROVIDER_SITE_OTHER): Payer: Medicare PPO | Admitting: Cardiovascular Disease

## 2014-09-07 VITALS — BP 126/54 | HR 51 | Ht 68.0 in | Wt 150.2 lb

## 2014-09-07 DIAGNOSIS — I480 Paroxysmal atrial fibrillation: Secondary | ICD-10-CM

## 2014-09-07 DIAGNOSIS — I739 Peripheral vascular disease, unspecified: Secondary | ICD-10-CM

## 2014-09-07 NOTE — Assessment & Plan Note (Signed)
The right foot ulceration is in the distribution of the anterior tibial artery and thus she might benefit from revascularization of chronically occluded proximal anterior tibial artery. However, the ulceration seems to have improved with simple wound care. Thus, I recommend continuing medical therapy for now with close observation. I will have her follow-up with me in 2 months. I instructed her to notify me if there is any worsening.

## 2014-09-07 NOTE — Patient Instructions (Signed)
Medication Instructions:  Your physician recommends that you continue on your current medications as directed. Please refer to the Current Medication list given to you today.   Labwork: None  Testing/Procedures: None  Follow-Up: Your physician recommends that you schedule a follow-up appointment in: TWO MONTHS with Dr. Fletcher Anon.    Any Other Special Instructions Will Be Listed Below (If Applicable).

## 2014-09-07 NOTE — Progress Notes (Signed)
Primary care physician: Dr. Sabra Heck Primary cardiologist: Dr. Stanford Breed  HPI  This is a pleasant 80 year old female who is here today for a follow-up visit regarding peripheral arterial disease. She has known history of atrial fibrillation with previous cardioversion in 2012. She has been maintaining in sinus rhythm with amiodarone. She has no history of coronary artery disease with previous nuclear stress test in 2012. Anticoagulation with warfarin was stopped due to frequent falls. She was seen recently for bilateral leg pain which has worsened recently. She had previous right knee replacements with some improvement in her symptoms. She was noted to have chronic ulceration on the right lateral ankle for few years which has required wound care.  She had previous wounds in the left leg which has healed without problems.  Noninvasive vascular evaluation showed an ABI of 0.68 on the right and 0.85 on the left. I proceeded with angiography on May 4 which showed:  1. No significant aortoiliac disease. 2. Moderate nonobstructive disease affecting the distal right SFA and popliteal artery. One-vessel runoff below the knee via the peroneal artery with short occlusion of the anterior tibial artery and reconstitution.  The ulcer seems to be better and currently there is no discharge. She continues to complain of chronic pain in that area.   Allergies  Allergen Reactions  . Amlodipine Nausea And Vomiting  . Ciprofloxacin Nausea Only  . Doxycycline Itching and Other (See Comments)    shaky  . Lipitor [Atorvastatin]     Joint pain   . Macrobid [Nitrofurantoin Macrocrystal] Rash  . Penicillins Rash     Current Outpatient Prescriptions on File Prior to Visit  Medication Sig Dispense Refill  . amiodarone (PACERONE) 200 MG tablet Take 1 tablet (200 mg total) by mouth daily. 90 tablet 3  . anastrozole (ARIMIDEX) 1 MG tablet Take 1 mg by mouth Daily.     . Ascorbic Acid (VITAMIN C PO) Take 1 tablet by  mouth daily.    Marland Kitchen aspirin EC 81 MG tablet Take 1 tablet (81 mg total) by mouth daily. 90 tablet 3  . calcium-vitamin D (OSCAL WITH D) 500-200 MG-UNIT per tablet Take 1 tablet by mouth daily with breakfast.    . Cholecalciferol (VITAMIN D-3 PO) Take 1 tablet by mouth daily.    Marland Kitchen docusate sodium (COLACE) 100 MG capsule Take 100 mg by mouth daily as needed for mild constipation.    . Iron-Vitamin C 65-125 MG TABS Take 1 tablet by mouth daily.    Marland Kitchen levothyroxine (SYNTHROID, LEVOTHROID) 150 MCG tablet Take 150 mcg by mouth daily.     Marland Kitchen lisinopril (PRINIVIL,ZESTRIL) 10 MG tablet Take 10 mg by mouth daily.    . metoprolol (LOPRESSOR) 100 MG tablet Take 0.5 tablets (50 mg total) by mouth 2 (two) times daily. (Patient taking differently: Take 100 mg by mouth 2 (two) times daily. )    . Multiple Vitamin (MULTIVITAMIN) tablet Take 1 tablet by mouth daily.      Marland Kitchen omeprazole (PRILOSEC) 20 MG capsule Take 20 mg by mouth daily.    . potassium chloride (KLOR-CON) 10 MEQ CR tablet Take 20 mEq by mouth daily. PT TAKES TWO EXTRA TABLETS WITH EXTRA FLUID PILL    . pravastatin (PRAVACHOL) 40 MG tablet Take 40 mg by mouth at bedtime.     . torsemide (DEMADEX) 20 MG tablet Take 2 tablets (40 mg total) by mouth daily. TAKE EXTRA 20 MG AS NEEDED 180 tablet 3  . venlafaxine (EFFEXOR-XR) 75 MG 24 hr  capsule Take 75 mg by mouth daily.       No current facility-administered medications on file prior to visit.     Past Medical History  Diagnosis Date  . Hypertension   . Hyperlipidemia   . Hypothyroid   . Atrial fibrillation     Echocardiogram 5/12: normal LV function, moderate LAE, mild RAE, mild AI and MR and mild-to-moderate TR.  Myoview 5/ 12 showed an ejection fraction of 73% and normal perfusion.   Patient underwent elective DCCV on 08/26/10     Past Surgical History  Procedure Laterality Date  . Abdominal hysterectomy    . Back surgery    . Total knee arthroplasty    . Tmj arthroplasty    . Tonsillectomy     . Appendectomy    . Cardioversion  11/10/2011    Procedure: CARDIOVERSION;  Surgeon: Lelon Perla, MD;  Location: [facility] OR;  Service: Cardiovascular;  Laterality: N/A;     Family History  Problem Relation Age of Onset  . Coronary artery disease Sister 82    MI  . Heart attack Father      History   Social History  . Marital Status: Married    Spouse Name: N/A  . Number of Children: 2  . Years of Education: N/A   Occupational History  . Not on file.   Social History Main Topics  . Smoking status: Never Smoker   . Smokeless tobacco: Not on file  . Alcohol Use: No  . Drug Use: Not on file  . Sexual Activity: Not on file   Other Topics Concern  . Not on file   Social History Narrative     ROS A 10 point review of system was performed. It is negative other than that mentioned in the history of present illness.   PHYSICAL EXAM   BP 126/54 mmHg  Pulse 51  Ht 5\' 8"  (1.727 m)  Wt 150 lb 4 oz (68.153 kg)  BMI 22.85 kg/m2 Constitutional: She is oriented to person, place, and time. She appears well-developed and well-nourished. No distress.  HENT: No nasal discharge.  Head: Normocephalic and atraumatic.  Eyes: Pupils are equal and round. No discharge.  Neck: Normal range of motion. Neck supple. No JVD present. No thyromegaly present.  Cardiovascular: Normal rate, regular rhythm, normal heart sounds. Exam reveals no gallop and no friction rub. No murmur heard.  Pulmonary/Chest: Effort normal and breath sounds normal. No stridor. No respiratory distress. She has no wheezes. She has no rales. She exhibits no tenderness.  Abdominal: Soft. Bowel sounds are normal. She exhibits no distension. There is no tenderness. There is no rebound and no guarding.  Musculoskeletal: Normal range of motion. She exhibits mild edema and no tenderness.  Neurological: She is alert and oriented to person, place, and time. Coordination normal.  Skin: Skin is warm and dry. No rash noted. She  is not diaphoretic. No erythema. No pallor.  Psychiatric: She has a normal mood and affect. Her behavior is normal. Judgment and thought content normal.  Vascular: Femoral pulses are normal bilaterally. Distal pulses are not palpable. There is dependent rubor. There is a small ulceration on the right lateral ankle with no warmth or discharge. There is a dry scab.  [id number]  Bradycardia  -Poor R-wave progression -nonspecific -consider old anterior infarct.   BORDERLINE    ASSESSMENT AND PLAN

## 2014-09-28 ENCOUNTER — Encounter: Payer: Medicare PPO | Attending: Surgery | Admitting: Surgery

## 2014-09-28 DIAGNOSIS — I4891 Unspecified atrial fibrillation: Secondary | ICD-10-CM | POA: Diagnosis not present

## 2014-09-28 DIAGNOSIS — R600 Localized edema: Secondary | ICD-10-CM | POA: Insufficient documentation

## 2014-09-28 DIAGNOSIS — I10 Essential (primary) hypertension: Secondary | ICD-10-CM | POA: Insufficient documentation

## 2014-09-28 DIAGNOSIS — E079 Disorder of thyroid, unspecified: Secondary | ICD-10-CM | POA: Insufficient documentation

## 2014-09-28 DIAGNOSIS — L97312 Non-pressure chronic ulcer of right ankle with fat layer exposed: Secondary | ICD-10-CM | POA: Diagnosis present

## 2014-09-28 DIAGNOSIS — Z853 Personal history of malignant neoplasm of breast: Secondary | ICD-10-CM | POA: Diagnosis not present

## 2014-09-28 DIAGNOSIS — I5022 Chronic systolic (congestive) heart failure: Secondary | ICD-10-CM | POA: Diagnosis not present

## 2014-09-28 DIAGNOSIS — I70238 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: Secondary | ICD-10-CM | POA: Diagnosis not present

## 2014-09-28 NOTE — Progress Notes (Addendum)
[name], [name] ([id number]) Visit Report for 09/28/2014 Allergy List Details Patient Name: [name], [name]. Date of Service: 09/28/2014 9:00 AM Medical Record Number: [id number] Patient Account Number: [id number] Date of Birth/Sex: 11-23-1932 (81 y.o. Female) Treating RN: Junious Dresser Primary Care Physician: Emily Filbert Other Clinician: Referring Physician: Emily Filbert Treating Physician/Extender: Frann Rider in Treatment: 0 Allergies Active Allergies Lipitor Reaction: joint pain Severity: Moderate Penicillins Reaction: rash Severity: Moderate Vioxx Reaction: rash Cipro Reaction: rash doxycycline Reaction: nausea, aching Severity: Moderate Active: 02/23/2014 Allergy Notes Electronic Signature(s) Signed: 09/28/2014 5:11:45 PM By: Junious Dresser RN Entered By: Junious Dresser on 09/28/2014 10:03:17 [name], [name] ([id number]) -------------------------------------------------------------------------------- Arrival Information Details Patient Name: [name]. Date of Service: 09/28/2014 9:00 AM Medical Record Number: [id number] Patient Account Number: [id number] Date of Birth/Sex: [birth year] (81 y.o. Female) Treating RN: Junious Dresser Primary Care Physician: Emily Filbert Other Clinician: Referring Physician: Emily Filbert Treating Physician/Extender: Frann Rider in Treatment: 0 Visit Information Patient Arrived: [name] Arrival Time: 09:45 Accompanied By: self Transfer Assistance: None Patient Identification Verified: Yes Secondary Verification Process Yes Completed: Patient Has Alerts: Yes Patient Alerts: Patient on Blood Thinner ASA 81 mg NO BP (R) ARM 11/15 ABI L-1.07 R-1.02 History Since Last Visit Added or deleted any medications: No Any new allergies or adverse reactions: No Had a fall or experienced change in activities of daily living that may affect risk of falls: Yes Signs or symptoms of abuse/neglect since last visito No Hospitalized since last visit:  No Electronic Signature(s) Signed: 09/28/2014 5:11:45 PM By: Junious Dresser RN Entered By: Junious Dresser on 09/28/2014 09:51:48 [name], [name] ([id number]) -------------------------------------------------------------------------------- Clinic Level of Care Assessment Details Patient Name: [name] Date of Service: 09/28/2014 9:00 AM Medical Record Number: [id number] Patient Account Number: [id number] Date of Birth/Sex: 1933-01-22 (81 y.o. Female) Treating RN: Montey Hora Primary Care Physician: Emily Filbert Other Clinician: Referring Physician: Emily Filbert Treating Physician/Extender: Frann Rider in Treatment: 0 Clinic Level of Care Assessment Items TOOL 1 Quantity Score []  - Use when EandM and Procedure is performed on INITIAL visit 0 ASSESSMENTS - Nursing Assessment / Reassessment X - General Physical Exam (combine w/ comprehensive assessment (listed just 1 20 below) when performed on new pt. evals) X - Comprehensive Assessment (HX, ROS, Risk Assessments, Wounds Hx, etc.) 1 25 ASSESSMENTS - Wound and Skin Assessment / Reassessment []  - Dermatologic / Skin Assessment (not related to wound area) 0 ASSESSMENTS - Ostomy and/or Continence Assessment and Care []  - Incontinence Assessment and Management 0 []  - Ostomy Care Assessment and Management (repouching, etc.) 0 PROCESS - Coordination of Care X - Simple Patient / Family Education for ongoing care 1 15 []  - Complex (extensive) Patient / Family Education for ongoing care 0 X - Staff obtains Programmer, systems, Records, Test Results / Process Orders 1 10 []  - Staff telephones HHA, Nursing Homes / Clarify orders / etc 0 []  - Routine Transfer to another Facility (non-emergent condition) 0 []  - Routine Hospital Admission (non-emergent condition) 0 X - New Admissions / Biomedical engineer / Ordering NPWT, Apligraf, etc. 1 15 []  - Emergency Hospital Admission (emergent condition) 0 PROCESS - Special Needs []  - Pediatric / Minor  Patient Management 0 []  - Isolation Patient Management 0 [name], [name] ([id number]) []  - Hearing / Language / Visual special needs 0 []  - Assessment of Community assistance (transportation, D/C planning, etc.) 0 []  - Additional assistance / Altered mentation 0 []  - Support Surface(s) Assessment (bed, cushion, seat, etc.) 0 INTERVENTIONS -  Miscellaneous []  - External ear exam 0 []  - Patient Transfer (multiple staff / Harrel Lemon Lift / Similar devices) 0 []  - Simple Staple / Suture removal (25 or less) 0 []  - Complex Staple / Suture removal (26 or more) 0 []  - Hypo/Hyperglycemic Management (do not check if billed separately) 0 []  - Ankle / Brachial Index (ABI) - do not check if billed separately 0 Has the patient been seen at the hospital within the last three years: Yes Total Score: 85 Level Of Care: New/Established - Level 3 Electronic Signature(s) Signed: 09/28/2014 11:24:03 AM By: Montey Hora Entered By: Montey Hora on 09/28/2014 11:24:02 [name], [name] ([id number]) -------------------------------------------------------------------------------- Encounter Discharge Information Details Patient Name: [name]. Date of Service: 09/28/2014 9:00 AM Medical Record Number: [id number] Patient Account Number: [id number] Date of Birth/Sex: 06/06/1932 (81 y.o. Female) Treating RN: Primary Care Physician: Emily Filbert Other Clinician: Referring Physician: Emily Filbert Treating Physician/Extender: Frann Rider in Treatment: 0 Encounter Discharge Information Items Discharge Condition: Stable Ambulatory Status: Ambulatory Discharge Destination: Home Transportation: Private Auto Accompanied By: self Schedule Follow-up Appointment: Yes Medication Reconciliation completed and provided to Patient/Care No [name]: Provided on Clinical Summary of Care: 09/28/2014 Form Type Recipient Paper Patient JW Electronic Signature(s) Signed: 09/29/2014 5:14:43 PM By: Junious Dresser RN Previous  Signature: 09/28/2014 10:44:03 AM Version By: Ruthine Dose Entered By: Junious Dresser on 09/29/2014 09:40:12 [name], [name] ([id number]) -------------------------------------------------------------------------------- General Visit Notes Details Patient Name: [name]. Date of Service: 09/28/2014 9:00 AM Medical Record Number: [id number] Patient Account Number: [id number] Date of Birth/Sex: 23-Sep-1932 (81 y.o. Female) Treating RN: Junious Dresser Primary Care Physician: Emily Filbert Other Clinician: Referring Physician: Emily Filbert Treating Physician/Extender: Frann Rider in Treatment: 0 Notes Pt has a wound on the LLE- lateral which is covered with a bandaid. Pt states she acquired this wound about 3 weeks ago when she fell. Pt has requested that she does not want [place] to treat this wound at this time. Electronic Signature(s) Signed: 09/28/2014 5:11:45 PM By: Junious Dresser RN Entered By: Junious Dresser on 09/28/2014 10:17:32 [name], [name] ([id number]) -------------------------------------------------------------------------------- Lower Extremity Assessment Details Patient Name: [name]. Date of Service: 09/28/2014 9:00 AM Medical Record Number: [id number] Patient Account Number: [id number] Date of Birth/Sex: [birth year] (81 y.o. Female) Treating RN: Junious Dresser Primary Care Physician: Emily Filbert Other Clinician: Referring Physician: Emily Filbert Treating Physician/Extender: Frann Rider in Treatment: 0 Edema Assessment Assessed: Shirlyn Goltz: No] [Right: Yes] Edema: [Left: Ye] [Right: s] Calf Left: Right: Point of Measurement: 33 cm From Medial Instep cm 32.6 cm Ankle Left: Right: Point of Measurement: 10 cm From Medial Instep cm 25.5 cm Vascular Assessment Claudication: Claudication Assessment [Right:None] Pulses: Posterior Tibial Palpable: [Right:Yes] Dorsalis Pedis Palpable: [Right:Yes] Extremity colors, hair growth, and conditions: Extremity Color:  [Right:Hyperpigmented] Hair Growth on Extremity: [Right:No] Temperature of Extremity: [Right:Warm] Capillary Refill: [Right:< 3 seconds] Dependent Rubor: [Right:No] Blanched when Elevated: [Right:No] Toe Nail Assessment Left: Right: Thick: Yes Discolored: Yes Deformed: No Improper Length and Hygiene: No [name], [name] ([id number]) Electronic Signature(s) Signed: 09/28/2014 5:11:45 PM By: Junious Dresser RN Entered By: Junious Dresser on 09/28/2014 09:55:51 Curtiss, [name] ([id number]) -------------------------------------------------------------------------------- Multi Wound Chart Details Patient Name: [name]. Date of Service: 09/28/2014 9:00 AM Medical Record Number: [id number] Patient Account Number: [id number] Date of Birth/Sex: [birth year] (81 y.o. Female) Treating RN: Montey Hora Primary Care Physician: Emily Filbert Other Clinician: Referring Physician: Emily Filbert Treating Physician/Extender: Frann Rider in Treatment: 0 Vital Signs Height(in): 68 Pulse(bpm): 48  Weight(lbs): 153.8 Blood Pressure 139/41 (mmHg): Body Mass Index(BMI): 23 Temperature(F): 97.9 Respiratory Rate 16 (breaths/min): Photos: [8:No Photos] [N/A:N/A] Wound Location: [8:Right Malleolus - Lateral] [N/A:N/A] Wounding Event: [8:Gradually Appeared] [N/A:N/A] Primary Etiology: [8:To be determined] [N/A:N/A] Date Acquired: [8:09/14/2014] [N/A:N/A] Weeks of Treatment: [8:0] [N/A:N/A] Wound Status: [8:Open] [N/A:N/A] Measurements L x W x D 0.7x0.5x0.1 [N/A:N/A] (cm) Area (cm) : [8:0.275] [N/A:N/A] Volume (cm) : [8:0.027] [N/A:N/A] % Reduction in Area: [8:0.00%] [N/A:N/A] % Reduction in Volume: 0.00% [N/A:N/A] Classification: [8:Full Thickness Without Exposed Support Structures] [N/A:N/A] Exudate Amount: [8:Small] [N/A:N/A] Exudate Type: [8:Serous] [N/A:N/A] Exudate Color: [8:amber] [N/A:N/A] Wound Margin: [8:Indistinct, nonvisible] [N/A:N/A] Granulation Amount: [8:None Present (0%)]  [N/A:N/A] Necrotic Amount: [8:Large (67-100%)] [N/A:N/A] Exposed Structures: [8:Fascia: No Fat: No Tendon: No Muscle: No Joint: No Bone: No Limited to Skin Breakdown] [N/A:N/A] Epithelialization: None N/A N/A Periwound Skin Texture: Edema: Yes N/A N/A Excoriation: No Induration: No Callus: No Crepitus: No Fluctuance: No Friable: No Rash: No Scarring: No Periwound Skin Dry/Scaly: Yes N/A N/A Moisture: Maceration: No Moist: No Periwound Skin Color: Erythema: Yes N/A N/A Atrophie Blanche: No Cyanosis: No Ecchymosis: No Hemosiderin Staining: No Mottled: No Pallor: No Rubor: No Temperature: No Abnormality N/A N/A Tenderness on Yes N/A N/A Palpation: Wound Preparation: Ulcer Cleansing: N/A N/A Rinsed/Irrigated with Saline Topical Anesthetic Applied: Other: Lidocaine 4% Ointment Treatment Notes Electronic Signature(s) Signed: 09/28/2014 5:12:42 PM By: Montey Hora Entered By: Montey Hora on 09/28/2014 10:25:44 [name], [name] ([id number]) -------------------------------------------------------------------------------- [place] Details Patient Name: [name]. Date of Service: 09/28/2014 9:00 AM Medical Record Number: [id number] Patient Account Number: [id number] Date of Birth/Sex: 1932-12-26 (81 y.o. Female) Treating RN: Montey Hora Primary Care Physician: Emily Filbert Other Clinician: Referring Physician: Emily Filbert Treating Physician/Extender: Frann Rider in Treatment: 0 Active Inactive Abuse / Safety / Falls / Self Care Management Nursing Diagnoses: Potential for falls Goals: Patient will remain injury free Date Initiated: 09/28/2014 Goal Status: Active Interventions: Assess fall risk on admission and as needed Notes: Nutrition Nursing Diagnoses: Potential for alteratiion in Nutrition/Potential for imbalanced nutrition Goals: Patient/caregiver agrees to and verbalizes understanding of need to use nutritional supplements  and/or vitamins as prescribed Date Initiated: 09/28/2014 Goal Status: Active Interventions: Assess patient nutrition upon admission and as needed per policy Notes: Wound/Skin Impairment Nursing Diagnoses: Impaired tissue integrity Goals: Ulcer/skin breakdown will have a volume reduction of 30% by week 4 [name], [name] ([id number]) Date Initiated: 09/28/2014 Goal Status: Active Interventions: Assess ulceration(s) every visit Notes: Electronic Signature(s) Signed: 09/28/2014 5:12:42 PM By: Montey Hora Entered By: Montey Hora on 09/28/2014 10:25:16 [name], [name] ([id number]) -------------------------------------------------------------------------------- Pain Assessment Details Patient Name: [name]. Date of Service: 09/28/2014 9:00 AM Medical Record Number: [id number] Patient Account Number: [id number] Date of Birth/Sex: [birth year] (81 y.o. Female) Treating RN: Junious Dresser Primary Care Physician: Emily Filbert Other Clinician: Referring Physician: Emily Filbert Treating Physician/Extender: Frann Rider in Treatment: 0 Active Problems Location of Pain Severity and Description of Pain Patient Has Paino Yes Site Locations Pain Location: Pain in Ulcers With Dressing Change: Yes Duration of the Pain. Constant / Intermittento Intermittent Rate the pain. Current Pain Level: 8 Worst Pain Level: 10 Least Pain Level: 8 Character of Pain Describe the Pain: Burning, Sharp, Stabbing Pain Management and Medication Current Pain Management: Electronic Signature(s) Signed: 09/28/2014 5:11:45 PM By: Junious Dresser RN Entered By: Junious Dresser on 09/28/2014 09:50:12 Ivery, [name] ([id number]) -------------------------------------------------------------------------------- Patient/Caregiver Education Details Patient Name: [name]. Date of Service: 09/28/2014 9:00 AM Medical Record Number: [id number]  Patient Account Number: [id number] Date of Birth/Gender: January 11, 1933 (81 y.o.  Female) Treating RN: Montey Hora Primary Care Physician: Emily Filbert Other Clinician: Referring Physician: Emily Filbert Treating Physician/Extender: Frann Rider in Treatment: 0 Education Assessment Education Provided To: Patient Education Topics Provided Wound/Skin Impairment: Handouts: Other: wound care as ordered Methods: Demonstration, Explain/Verbal Responses: State content correctly Electronic Signature(s) Signed: 09/28/2014 5:12:42 PM By: Montey Hora Entered By: Montey Hora on 09/28/2014 10:26:13 Faulstich, [name] ([id number]) -------------------------------------------------------------------------------- Wound Assessment Details Patient Name: [name]. Date of Service: 09/28/2014 9:00 AM Medical Record Number: [id number] Patient Account Number: [id number] Date of Birth/Sex: 04/08/1933 (81 y.o. Female) Treating RN: Junious Dresser Primary Care Physician: Emily Filbert Other Clinician: Referring Physician: Emily Filbert Treating Physician/Extender: Frann Rider in Treatment: 0 Wound Status Wound Number: 8 Primary Etiology: To be determined Wound Location: Right Malleolus - Lateral Wound Status: Open Wounding Event: Gradually Appeared Date Acquired: 09/14/2014 Weeks Of Treatment: 0 Clustered Wound: No Photos Photo Uploaded By: Junious Dresser on 09/28/2014 12:28:41 Wound Measurements Length: (cm) 0.7 % Reduction in Width: (cm) 0.5 % Reduction in Depth: (cm) 0.1 Epithelializati Area: (cm) 0.275 Tunneling: Volume: (cm) 0.027 Undermining: Area: 0% Volume: 0% on: None No No Wound Description Full Thickness Without Exposed Classification: Support Structures Wound Margin: Indistinct, nonvisible Exudate Small Amount: Brandi, Danitra Loni Muse ([id number]) Foul Odor After Cleansing: No Exudate Type: Serous Exudate Color: amber Wound Bed Granulation Amount: None Present (0%) Exposed Structure Necrotic Amount: Large (67-100%) Fascia Exposed:  No Necrotic Quality: Adherent Slough Fat Layer Exposed: No Tendon Exposed: No Muscle Exposed: No Joint Exposed: No Bone Exposed: No Limited to Skin Breakdown Periwound Skin Texture Texture Color No Abnormalities Noted: No No Abnormalities Noted: No Callus: No Atrophie Blanche: No Crepitus: No Cyanosis: No Excoriation: No Ecchymosis: No Fluctuance: No Erythema: Yes Friable: No Hemosiderin Staining: No Induration: No Mottled: No Localized Edema: Yes Pallor: No Rash: No Rubor: No Scarring: No Temperature / Pain Moisture Temperature: No Abnormality No Abnormalities Noted: No Tenderness on Palpation: Yes Dry / Scaly: Yes Maceration: No Moist: No Wound Preparation Ulcer Cleansing: Rinsed/Irrigated with Saline Topical Anesthetic Applied: Other: Lidocaine 4% Ointment, Treatment Notes Wound #8 (Right, Lateral Malleolus) 1. Cleansed with: Clean wound with Normal Saline 2. Anesthetic Topical Lidocaine 4% cream to wound bed prior to debridement 4. Dressing Applied: Aquacel Ag 7. Secured with 2 Layer Lite Compression System - Right Lower Extremity Electronic Signature(s) LILEIGH, PASZEK ([id number]) Signed: 09/28/2014 5:11:45 PM By: Junious Dresser RN Entered By: Junious Dresser on 09/28/2014 10:01:46 Henriquez, [name] ([id number]) -------------------------------------------------------------------------------- Vitals Details Patient Name: [name] Date of Service: 09/28/2014 9:00 AM Medical Record Number: [id number] Patient Account Number: [id number] Date of Birth/Sex: Aug 19, 1932 (81 y.o. Female) Treating RN: Junious Dresser Primary Care Physician: Emily Filbert Other Clinician: Referring Physician: Emily Filbert Treating Physician/Extender: Frann Rider in Treatment: 0 Vital Signs Time Taken: 09:45 Temperature (F): 97.9 Height (in): 68 Pulse (bpm): 48 Source: Stated Respiratory Rate (breaths/min): 16 Weight (lbs): 153.8 Blood Pressure (mmHg): 139/41 Source:  Measured Reference Range: 80 - 120 mg / dl Body Mass Index (BMI): 23.4 Electronic Signature(s) Signed: 09/28/2014 5:11:45 PM By: Junious Dresser RN Entered By: Junious Dresser on 09/28/2014 09:50:45

## 2014-09-28 NOTE — Progress Notes (Signed)
[name], [name] ([id number]) Visit Report for 09/28/2014 Abuse/Suicide Risk Screen Details Patient Name: [name], [name] 09/28/2014 9:00 Date of Service: AM Medical Record [id number] Number: Patient Account Number: [id number] 1932/12/07 (81 y.o. Treating RN: Junious Dresser Date of Birth/Sex: Female) Other Clinician: Primary Care Physician: Emily Filbert Treating Christin Fudge Referring Physician: Emily Filbert Physician/Extender: Suella Grove in Treatment: 0 Abuse/Suicide Risk Screen Items Answer ABUSE/SUICIDE RISK SCREEN: Has anyone close to you tried to hurt or harm you recentlyo No Do you feel uncomfortable with anyone in your familyo No Has anyone forced you do things that you didnot want to doo No Do you have any thoughts of harming yourselfo No Patient displays signs or symptoms of abuse and/or neglect. No Electronic Signature(s) Signed: 09/28/2014 5:11:45 PM By: Junious Dresser RN Entered By: Junious Dresser on 09/28/2014 10:10:41 Anderegg, [name] ([id number]) -------------------------------------------------------------------------------- Activities of Daily Living Details Patient Name: [name], [name] 09/28/2014 9:00 Date of Service: AM Medical Record [id number] Number: Patient Account Number: [id number] June 01, 1932 (81 y.o. Treating RN: Junious Dresser Date of Birth/Sex: Female) Other Clinician: Primary Care Physician: Emily Filbert Treating Christin Fudge Referring Physician: Emily Filbert Physician/Extender: Suella Grove in Treatment: 0 Activities of Daily Living Items Answer Activities of Daily Living (Please select one for each item) Drive Automobile Completely Able Take Medications Completely Able Use Telephone Completely Able Care for Appearance Completely Able Use Toilet Completely Able Bath / Shower Completely Able Dress Self Completely Able Feed Self Completely Able Walk Completely Able Get In / Out Bed Completely Able Housework Completely Able Prepare Meals Completely Able Handle Money  Completely Able Shop for Self Completely Able Electronic Signature(s) Signed: 09/28/2014 5:11:45 PM By: Junious Dresser RN Entered By: Junious Dresser on 09/28/2014 10:10:56 [place], [name] ([id number]) -------------------------------------------------------------------------------- Education Assessment Details Patient Name: [name], [name] 09/28/2014 9:00 Date of Service: AM Medical Record [id number] Number: Patient Account Number: [id number] 11/30/1932 (81 y.o. Treating RN: Junious Dresser Date of Birth/Sex: Female) Other Clinician: Primary Care Physician: Emily Filbert Treating Christin Fudge Referring Physician: Emily Filbert Physician/Extender: Suella Grove in Treatment: 0 Primary Learner Assessed: Patient Learning Preferences/Education Level/Primary Language Learning Preference: Explanation, Demonstration, Printed Material Highest Education Level: College or Above Preferred Language: English Cognitive Barrier Assessment/Beliefs Language Barrier: No Translator Needed: No Memory Deficit: No Emotional Barrier: No Cultural/Religious Beliefs Affecting Medical No Care: Physical Barrier Assessment Impaired Vision: Yes Glasses Impaired Hearing: Yes Hearing Aid Decreased Hand dexterity: No Knowledge/Comprehension Assessment Knowledge Level: Medium Comprehension Level: Medium Ability to understand written Medium instructions: Ability to understand verbal Medium instructions: Motivation Assessment Anxiety Level: Calm Cooperation: Cooperative Education Importance: Acknowledges Need Interest in Health Problems: Asks Questions Perception: Coherent Willingness to Engage in Self- Medium Management Activities: Readiness to Engage in Self- Medium Management Activities: [name], [name] ([id number]) Electronic Signature(s) Signed: 09/28/2014 5:11:45 PM By: Junious Dresser RN Entered By: Junious Dresser on 09/28/2014 10:11:40 [name], [name]  ([id number]) -------------------------------------------------------------------------------- Fall Risk Assessment Details Patient Name: [name]. 09/28/2014 9:00 Date of Service: AM Medical Record [id number] Number: Patient Account Number: [id number] December 13, 1932 (81 y.o. Treating RN: Junious Dresser Date of Birth/Sex: Female) Other Clinician: Primary Care Physician: Emily Filbert Treating Christin Fudge Referring Physician: Emily Filbert Physician/Extender: Suella Grove in Treatment: 0 Fall Risk Assessment Items FALL RISK ASSESSMENT: History of falling - immediate or within 3 months 25 Yes Secondary diagnosis 0 No Ambulatory aid None/bed rest/wheelchair/nurse 0 No Crutches/cane/walker 15 Yes Furniture 0 No IV Access/Saline Lock 0 No Gait/Training Normal/bed rest/immobile 0 Yes Weak 0 No Impaired 0 No Mental Status Oriented to  own ability 0 Yes Electronic Signature(s) Signed: 09/28/2014 5:11:45 PM By: Junious Dresser RN Entered By: Junious Dresser on 09/28/2014 10:12:01 [place], [name] ([id number]) -------------------------------------------------------------------------------- Foot Assessment Details Patient Name: [name], [name] 09/28/2014 9:00 Date of Service: AM Medical Record [id number] Number: Patient Account Number: [id number] [birth year] (81 y.o. Treating RN: Junious Dresser Date of Birth/Sex: Female) Other Clinician: Primary Care Physician: Emily Filbert Treating Britto, Errol Referring Physician: Emily Filbert Physician/Extender: Suella Grove in Treatment: 0 Foot Assessment Items Site Locations + = Sensation present, - = Sensation absent, C = Callus, U = Ulcer R = Redness, W = Warmth, M = Maceration, PU = Pre-ulcerative lesion F = Fissure, S = Swelling, D = Dryness Assessment Right: Left: Other Deformity: No No Prior Foot Ulcer: No No Prior Amputation: No No Charcot Joint: No No Ambulatory Status: Ambulatory With Help Assistance Device: Cane Gait: Steady Electronic  Signature(s) Signed: 09/28/2014 5:11:45 PM By: Junious Dresser RN Entered By: Junious Dresser on 09/28/2014 10:13:37 Pasch, [name] ([id number]) [place], [name] ([id number]) -------------------------------------------------------------------------------- Nutrition Risk Assessment Details Patient Name: [name], [name] 09/28/2014 9:00 Date of Service: AM Medical Record [id number] Number: Patient Account Number: [id number] 06-Oct-1932 (81 y.o. Treating RN: Junious Dresser Date of Birth/Sex: Female) Other Clinician: Primary Care Physician: Emily Filbert Treating Christin Fudge Referring Physician: Emily Filbert Physician/Extender: Suella Grove in Treatment: 0 Height (in): 68 Weight (lbs): 153.8 Body Mass Index (BMI): 23.4 Nutrition Risk Assessment Items NUTRITION RISK SCREEN: I have an illness or condition that made me change the kind and/or 0 No amount of food I eat I eat fewer than two meals per day 0 No I eat few fruits and vegetables, or milk products 0 No I have three or more drinks of beer, liquor or wine almost every day 0 No I have tooth or mouth problems that make it hard for me to eat 0 No I don't always have enough money to buy the food I need 0 No I eat alone most of the time 0 No I take three or more different prescribed or over-the-counter drugs a 1 Yes day Without wanting to, I have lost or gained 10 pounds in the last six 2 Yes months I am not always physically able to shop, cook and/or feed myself 0 No Nutrition Protocols Good Risk Protocol Provide education on Moderate Risk Protocol 0 nutrition Electronic Signature(s) Signed: 09/28/2014 5:11:45 PM By: Junious Dresser RN Entered By: Junious Dresser on 09/28/2014 10:12:32

## 2014-09-28 NOTE — Progress Notes (Signed)
[name], [name] ([id number]) Visit Report for 09/28/2014 Chief Complaint Document Details Patient Name: [name], [name] 09/28/2014 9:00 Date of Service: AM Medical Record [id number] Number: Patient Account Number: [id number] 1933-03-30 (81 y.o. Treating RN: Date of Birth/Sex: Female) Other Clinician: Primary Care Physician: Emily Filbert Treating Christin Fudge Referring Physician: Emily Filbert Physician/Extender: Suella Grove in Treatment: 0 Information Obtained from: Patient Chief Complaint Patient presents to the wound care center for a consult due non healing wound. 81 year old patient who developed a spontaneous ulcer on the right lateral ankle area for about 2 weeks now. Electronic Signature(s) Signed: 09/28/2014 12:20:11 PM By: Christin Fudge MD, FACS Entered By: Christin Fudge on 09/28/2014 10:40:52 [name] ([id number]) -------------------------------------------------------------------------------- Debridement Details Patient Name: [name] 09/28/2014 9:00 Date of Service: AM Medical Record [id number] Number: Patient Account Number: [id number] 10-Aug-1932 (81 y.o. Treating RN: Date of Birth/Sex: Female) Other Clinician: Primary Care Physician: Emily Filbert Treating Chrysa Rampy Referring Physician: Emily Filbert Physician/Extender: Suella Grove in Treatment: 0 Debridement Performed for Wound #8 Right,Lateral Malleolus Assessment: Performed By: Physician Pat Patrick., MD Debridement: Open Wound/Selective Debridement Selective Description: Pre-procedure Yes Verification/Time Out Taken: Start Time: 10:20 Pain Control: Lidocaine 5% topical ointment Level: Non-Viable Tissue Total Area Debrided (L x 0.7 (cm) x 0.5 (cm) = 0.35 (cm) W): Tissue and other Non-Viable, Eschar, Exudate, Fibrin/Slough material debrided: Instrument: Other : saline gauze Bleeding: None End Time: 10:22 Procedural Pain: 2 Post Procedural Pain: 0 Response to Treatment: Procedure was tolerated  well Post Debridement Measurements of Total Wound Length: (cm) 0.7 Width: (cm) 0.5 Depth: (cm) 0.1 Volume: (cm) 0.027 Electronic Signature(s) Signed: 09/28/2014 12:20:11 PM By: Christin Fudge MD, FACS Entered By: Christin Fudge on 09/28/2014 10:40:07 [name] ([id number]) -------------------------------------------------------------------------------- HPI Details Patient Name: [name] 09/28/2014 9:00 Date of Service: AM Medical Record [id number] Number: Patient Account Number: [id number] [birth year] (81 y.o. Treating RN: Date of Birth/Sex: Female) Other Clinician: Primary Care Physician: Emily Filbert Treating Christin Fudge Referring Physician: Emily Filbert Physician/Extender: Suella Grove in Treatment: 0 History of Present Illness Location: right lateral ankle Quality: Patient reports experiencing a sharp pain to affected area(s). Severity: Patient states wound are getting worse. Duration: Patient has had the wound for < 2 weeks prior to presenting for treatment Timing: Pain in wound is constant (hurts all the time) Context: The wound appeared gradually over time Modifying Factors: Other treatment(s) tried include: treatment for UTI as prescribed by her PCP Associated Signs and Symptoms: Patient reports having difficulty standing for long periods. HPI Description: This time around she is coming with a spontaneous ulceration of her right lower extremity and this appeared recently about 2 weeks ago. She's had some pain in this leg and has had problems with sleeping at night because of the pain. She is known to wound center and has been seen in the past. Mrs. Duren is a 81 year old female who has history of atrial fibrillation who originally presented a year ago ago with a large left lower extremity wound/hematoma. We took care of her for that and now her wound on her left lower extremity is now completely healed. She presented in our clinic again in 04/2013 with an open wound on her  right lateral malleolus. She does have a history of the wound at the site. She states that approximately 6-8 months before she presented in January/2015 she noticed a circular wound on her right lateral malleolus. At the time, we had tried conservative wound care and ultimately closed the wound with Grafix wound matrix application.  She then had a successful R knee replacement (May, 2015). Recently seen by her cardiologist Dr. Kathlyn Sacramento -- Noninvasive vascular evaluation showed an ABI of 0.68 on the right and 0.85 on the left. I proceeded with angiography on May 4 which showed: 1. No significant aortoiliac disease. 2. Moderate nonobstructive disease affecting the distal right SFA and popliteal artery. One-vessel runoff below the knee via the peroneal artery with short occlusion of the anterior tibial artery and reconstitution. Plan : The right foot ulceration is in the distribution of the anterior tibial artery and thus she might benefit from revascularization of chronically occluded proximal anterior tibial artery. However, the ulceration seems to have improved with simple wound care. Thus, I recommend continuing medical therapy for now with close observation. I will have her follow-up with me in 2 months. I instructed her to notify me if there is any worsening. oPast Medical History:DDD (degenerative disc disease);Thyroid disease ; Hypertension; Gastritis ; Lumbar disc disease ; Ductal carcinoma of breast Atrial fibrillation ; Breast cancer 10/15/2013;Right lumpectomy, DCIS Past Surgical History: Hysterectomy with BSO ; Kidney surgery ; Temporomandibular joint arthroplasty; Bunion correction; Laminectomy lumbar spine; Joint replacement Right knee replacement ;Tonsillectomy; Replacement total knee Right ; Appendectomy ; Right breast lumpectomy Right ankle ulcer surgery 2013 [name], [name] ([id number]) o Electronic Signature(s) Signed: 09/28/2014 12:20:11 PM By: Christin Fudge MD, FACS Entered  By: Christin Fudge on 09/28/2014 11:00:05 [name] ([id number]) -------------------------------------------------------------------------------- Physical Exam Details Patient Name: [name], [name] 09/28/2014 9:00 Date of Service: AM Medical Record [id number] Number: Patient Account Number: [id number] 17-Jan-1933 (81 y.o. Treating RN: Date of Birth/Sex: Female) Other Clinician: Primary Care Physician: Emily Filbert Treating Christin Fudge Referring Physician: Emily Filbert Physician/Extender: Weeks in Treatment: 0 Constitutional . Pulse regular. Respirations normal and unlabored. Afebrile. . Eyes Nonicteric. Reactive to light. Ears, Nose, Mouth, and Throat Lips, teeth, and gums WNL.Marland Kitchen Moist mucosa without lesions . Neck supple and nontender. No palpable supraclavicular or cervical adenopathy. Normal sized without goiter. Respiratory WNL. No retractions.. Cardiovascular Pedal Pulses WNL. due to edema nonpalpable pulses but she is Doppler signal. she is significant edema of the right lower extremity which is +2 pitting.. Gastrointestinal (GI) Abdomen without masses or tenderness.. No liver or spleen enlargement or tenderness.. Musculoskeletal Adexa without tenderness or enlargement.. Digits and nails w/o clubbing, cyanosis, infection, petechiae, ischemia, or inflammatory conditions.. Integumentary (Hair, Skin) ulcerated area right lateral ankle with some slough at the base.Marland Kitchen Psychiatric Judgement and insight Intact.. No evidence of depression, anxiety, or agitation.. Electronic Signature(s) Signed: 09/28/2014 12:20:11 PM By: Christin Fudge MD, FACS Entered By: Christin Fudge on 09/28/2014 11:01:31 [name], [name] ([id number]) -------------------------------------------------------------------------------- Physician Orders Details Patient Name: KAMEYA, AYDT 09/28/2014 9:00 Date of Service: AM Medical Record [id number] Number: Patient Account Number: [id number] 09-24-1932 (81 y.o. Treating  RN: Montey Hora Date of Birth/Sex: Female) Other Clinician: Primary Care Physician: Emily Filbert Treating Wilberth Damon Referring Physician: Emily Filbert Physician/Extender: Suella Grove in Treatment: 0 Verbal / Phone Orders: Yes Clinician: Montey Hora Read Back and Verified: Yes Diagnosis Coding ICD-10 Coding Code Description I70.238 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 L97.312 Non-pressure chronic ulcer of right ankle with fat layer exposed Wound Cleansing Wound #8 Right,Lateral Malleolus o Clean wound with Normal Saline. Anesthetic Wound #8 Right,Lateral Malleolus o Topical Lidocaine 4% cream applied to wound bed prior to debridement Primary Wound Dressing Wound #8 Right,Lateral Malleolus o Aquacel Ag Secondary Dressing Wound #8 Right,Lateral Malleolus o ABD  pad Dressing Change Frequency Wound #8 Right,Lateral Malleolus o Change dressing every week Follow-up Appointments Wound #8 Right,Lateral Malleolus o Return Appointment in 1 week. Edema Control Wound #8 Right,Lateral Malleolus o 2 Layer Lite Compression System - Right Lower Extremity [name], [name] ([id number]) Electronic Signature(s) Signed: 09/28/2014 12:20:11 PM By: Christin Fudge MD, FACS Signed: 09/28/2014 5:12:42 PM By: Montey Hora Entered By: Montey Hora on 09/28/2014 10:38:54 Lucente, [name] ([id number]) -------------------------------------------------------------------------------- Problem List Details Patient Name: KATALEA, RAJCHEL 09/28/2014 9:00 Date of Service: AM Medical Record [id number] Number: Patient Account Number: [id number] 1932/12/18 (81 y.o. Treating RN: Date of Birth/Sex: Female) Other Clinician: Primary Care Physician: Emily Filbert Treating Christin Fudge Referring Physician: Emily Filbert Physician/Extender: Suella Grove in Treatment: 0 Active Problems ICD-10 Encounter Code Description Active Date Diagnosis I70.238  Atherosclerosis of native arteries of right leg with 09/28/2014 Yes ulceration of other part of lower right leg L97.312 Non-pressure chronic ulcer of right ankle with fat layer 09/28/2014 Yes exposed [id number] Chronic systolic (congestive) heart failure 09/28/2014 Yes Inactive Problems Resolved Problems Electronic Signature(s) Signed: 09/28/2014 12:20:11 PM By: Christin Fudge MD, FACS Entered By: Christin Fudge on 09/28/2014 10:38:46 Placke, [name] ([id number]) -------------------------------------------------------------------------------- Progress Note Details Patient Name: BRICELYN, FEIST 09/28/2014 9:00 Date of Service: AM Medical Record [id number] Number: Patient Account Number: [id number] 1932-09-21 (81 y.o. Treating RN: Date of Birth/Sex: Female) Other Clinician: Primary Care Physician: Emily Filbert Treating Christin Fudge Referring Physician: Emily Filbert Physician/Extender: Suella Grove in Treatment: 0 Subjective Chief Complaint Information obtained from Patient Patient presents to the wound care center for a consult due non healing wound. 81 year old patient who developed a spontaneous ulcer on the right lateral ankle area for about 2 weeks now. History of Present Illness (HPI) The following HPI elements were documented for the patient's wound: Location: right lateral ankle Quality: Patient reports experiencing a sharp pain to affected area(s). Severity: Patient states wound are getting worse. Duration: Patient has had the wound for < 2 weeks prior to presenting for treatment Timing: Pain in wound is constant (hurts all the time) Context: The wound appeared gradually over time Modifying Factors: Other treatment(s) tried include: treatment for UTI as prescribed by her PCP Associated Signs and Symptoms: Patient reports having difficulty standing for long periods. This time around she is coming with a spontaneous ulceration of her right lower extremity and this appeared recently about 2 weeks  ago. She's had some pain in this leg and has had problems with sleeping at night because of the pain. She is known to wound center and has been seen in the past. Mrs. Walrath is a 81 year old female who has history of atrial fibrillation who originally presented a year ago ago with a large left lower extremity wound/hematoma. We took care of her for that and now her wound on her left lower extremity is now completely healed. She presented in our clinic again in 04/2013 with an open wound on her right lateral malleolus. She does have a history of the wound at the site. She states that approximately 6-8 months before she presented in January/2015 she noticed a circular wound on her right lateral malleolus. At the time, we had tried conservative wound care and ultimately closed the wound with Grafix wound matrix application. She then had a successful R knee replacement (May, 2015). Recently seen by her cardiologist Dr. Kathlyn Sacramento -- Noninvasive vascular evaluation showed an ABI of 0.68 on the right and 0.85 on the left. I proceeded with angiography on May 4 which showed: 1.  No significant aortoiliac disease. 2. Moderate nonobstructive disease affecting the distal right SFA and popliteal artery. One-vessel runoff below the knee via the peroneal artery with short occlusion of the anterior tibial artery and reconstitution. Plan : The right foot ulceration is in the distribution of the anterior tibial artery and thus she might benefit from revascularization of chronically occluded proximal anterior tibial artery. However, the ulceration seems to have improved with simple wound care. Thus, I recommend continuing medical therapy for now with close observation. I will have her follow-up with me in 2 months. I instructed her to notify me if there is any [name], [name]. ([id number]) worsening. Past Medical History:DDD (degenerative disc disease);Thyroid disease ; Hypertension; Gastritis ; Lumbar disc disease ;  Ductal carcinoma of breast Atrial fibrillation ; Breast cancer 10/15/2013;Right lumpectomy, DCIS Past Surgical History: Hysterectomy with BSO ; Kidney surgery ; Temporomandibular joint arthroplasty; Bunion correction; Laminectomy lumbar spine; Joint replacement Right knee replacement ;Tonsillectomy; Replacement total knee Right ; Appendectomy ; Right breast lumpectomy Right ankle ulcer surgery 2013 Wound History Patient presents with 2 open wounds that have been present for approximately 2-3 weeks. Patient has been treating wounds in the following manner: neosporin. The wounds have been healed in the past but have re- opened. Laboratory tests have been performed in the last month. Patient reportedly has not tested positive for an antibiotic resistant organism. Patient reportedly has not tested positive for osteomyelitis. Patient reportedly has had testing performed to evaluate circulation in the legs. Patient experiences the following problems associated with their wounds: infection, swelling. Patient History Information obtained from Patient. Allergies Lipitor (Severity: Moderate, Reaction: joint pain), Penicillins (Severity: Moderate, Reaction: rash), Vioxx (Reaction: rash), Cipro (Reaction: rash), doxycycline (Severity: Moderate, Reaction: nausea, aching) Family History Cancer - Siblings, Heart Disease - Father, Hypertension - Siblings, Stroke - Mother, No family history of Hereditary Spherocytosis, Kidney Disease, Lung Disease, Seizures, Thyroid Problems, Tuberculosis. Social History Never smoker, Marital Status - Married, Alcohol Use - Never, Drug Use - No History, Caffeine Use - Daily. Medical History Eyes Patient has history of Cataracts Ear/Nose/Mouth/Throat Denies history of Chronic sinus problems/congestion, Middle ear problems Cardiovascular Patient has history of Arrhythmia - A fib Hospitalization/Surgery History - 09/16/2014, [facility], (R) knee replacement. Medical And Surgical  History Notes Ear/Nose/Mouth/Throat [name], [name] ([id number]) HOH- hearing aids Genitourinary frequent UTIs Oncologic Breast Ca- R mastectomy Review of Systems (ROS) Constitutional Symptoms (General Health) Complains or has symptoms of Marked Weight Change - lost 25 lbs in 8 months. Eyes Complains or has symptoms of Glasses / Contacts. Ear/Nose/Mouth/Throat The patient has no complaints or symptoms. Hematologic/Lymphatic The patient has no complaints or symptoms. Respiratory The patient has no complaints or symptoms. Cardiovascular Complains or has symptoms of LE edema. Denies complaints or symptoms of Chest pain. Gastrointestinal The patient has no complaints or symptoms. Endocrine Complains or has symptoms of Thyroid disease - hypothyriodism. Genitourinary The patient has no complaints or symptoms. Immunological The patient has no complaints or symptoms. Integumentary (Skin) Complains or has symptoms of Wounds, Bleeding or bruising tendency, Swelling - BLE. Musculoskeletal The patient has no complaints or symptoms. Neurologic Complains or has symptoms of Numbness/parasthesias - R heel. Psychiatric The patient has no complaints or symptoms. Medications Adult Low Dose Aspirin 81 mg tablet,delayed release oral tablet,delayed release (DR/EC) oral once daily amiodarone 200 mg tablet oral tablet oral once daily pravastatin 40 mg tablet oral 1 1 tablet oral daily at night anastrozole 1 mg tablet oral 1 tablet oral once daily metoprolol tartrate 100  mg tablet oral 1 1 tablet oral twice daily diltiazem ER 90 mg capsule,extended release 12 hr oral 1 1 capsule,extended release 12 hr oral calcium carbonate 600 mg (1,500 mg) tablet oral 1 1 tablet oral once daily Vitron-C 65 mg iron-125 mg tablet,delayed release oral tablet,delayed release (DR/EC) oral once daily Colace 100 mg capsule oral capsule oral once daily as needed [name], [name]. ([id number]) torsemide 20 mg tablet oral 1 1  tablet oral once daily Daily Multivitamin 200 mcg-100 mcg-500 mcg capsule oral capsule oral once daily potassium chloride ER 10 mEq tablet,extended release(part/cryst) oral 1 1 tablet,ER particles/crystals oral four times daily omeprazole 40 mg capsule,delayed release oral 1 1 capsule,delayed release(DR/EC) oral once daily tamoxifen 20 mg tablet oral 1 tablet oral once daily levothyroxine 150 mcg tablet oral 1 tablet oral once daily Vitamin D3 400 unit capsule oral capsule oral daily Objective Constitutional Pulse regular. Respirations normal and unlabored. Afebrile. Vitals Time Taken: 9:45 AM, Height: 68 in, Source: Stated, Weight: 153.8 lbs, Source: Measured, BMI: 23.4, Temperature: 97.9 F, Pulse: 48 bpm, Respiratory Rate: 16 breaths/min, Blood Pressure: 139/41 mmHg. Eyes Nonicteric. Reactive to light. Ears, Nose, Mouth, and Throat Lips, teeth, and gums WNL.Marland Kitchen Moist mucosa without lesions . Neck supple and nontender. No palpable supraclavicular or cervical adenopathy. Normal sized without goiter. Respiratory WNL. No retractions.. Cardiovascular Pedal Pulses WNL. due to edema nonpalpable pulses but she is Doppler signal. she is significant edema of the right lower extremity which is +2 pitting.. Gastrointestinal (GI) Abdomen without masses or tenderness.. No liver or spleen enlargement or tenderness.. Musculoskeletal Adexa without tenderness or enlargement.. Digits and nails w/o clubbing, cyanosis, infection, petechiae, ischemia, or inflammatory conditions.Marland Kitchen Psychiatric Judgement and insight Intact.. No evidence of depression, anxiety, or agitation.[name], [name] ([id number]) Integumentary (Hair, Skin) ulcerated area right lateral ankle with some slough at the base.. Wound #8 status is Open. Original cause of wound was Gradually Appeared. The wound is located on the Right,Lateral Malleolus. The wound measures 0.7cm length x 0.5cm width x 0.1cm depth; 0.275cm^2 area and 0.027cm^3  volume. The wound is limited to skin breakdown. There is no tunneling or undermining noted. There is a small amount of serous drainage noted. The wound margin is indistinct and nonvisible. There is no granulation within the wound bed. There is a large (67-100%) amount of necrotic tissue within the wound bed including Adherent Slough. The periwound skin appearance exhibited: Localized Edema, Dry/Scaly, Erythema. The periwound skin appearance did not exhibit: Callus, Crepitus, Excoriation, Fluctuance, Friable, Induration, Rash, Scarring, Maceration, Moist, Atrophie Blanche, Cyanosis, Ecchymosis, Hemosiderin Staining, Mottled, Pallor, Rubor. The surrounding wound skin color is noted with erythema. Periwound temperature was noted as No Abnormality. The periwound has tenderness on palpation. Assessment Active Problems ICD-10 I70.238 - 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 L97.312 - Non-pressure chronic ulcer of right ankle with fat layer exposed [id number] - Chronic systolic (congestive) heart failure The patient has a ulceration on the right lateral ankle which is suggestive of an arterial ulceration. Her recent arterial studies have been noted and I have asked her to get an opinion from the cardiologist who did her angiogram. She also needs to see a cardiologist regarding her CHF and possible workup regarding this. We will start dressing of wound with silver alginate and lap apply a very minimal compression which will help in controlling some of her edema. She is also been advised to remove her compression completely if she gets tingling  and numbness in her toes or toes turn blue or if the pain increases. She will come and see [place] at weekly intervals. Procedures Wound #8 Wound #8 is a To be determined located on the Right,Lateral Malleolus . There was a Non-Viable Tissue [name], Latoya A. ([id number]) Open Wound/Selective [id number])  debridement with total area of 0.35 sq cm performed by Pat Patrick., MD. with the following instrument(s): saline gauze to remove Non-Viable tissue/material including Exudate, Fibrin/Slough, and Eschar after achieving pain control using Lidocaine 5% topical ointment. A time out was conducted prior to the start of the procedure. There was no bleeding. The procedure was tolerated well with a pain level of 2 throughout and a pain level of 0 following the procedure. Post Debridement Measurements: 0.7cm length x 0.5cm width x 0.1cm depth; 0.027cm^3 volume. Plan Wound Cleansing: Wound #8 Right,Lateral Malleolus: Clean wound with Normal Saline. Anesthetic: Wound #8 Right,Lateral Malleolus: Topical Lidocaine 4% cream applied to wound bed prior to debridement Primary Wound Dressing: Wound #8 Right,Lateral Malleolus: Aquacel Ag Secondary Dressing: Wound #8 Right,Lateral Malleolus: ABD pad Dressing Change Frequency: Wound #8 Right,Lateral Malleolus: Change dressing every week Follow-up Appointments: Wound #8 Right,Lateral Malleolus: Return Appointment in 1 week. Edema Control: Wound #8 Right,Lateral Malleolus: 2 Layer Lite Compression System - Right Lower Extremity The patient has a ulceration on the right lateral ankle which is suggestive of an arterial ulceration. Her recent arterial studies have been noted and I have asked her to get an opinion from the cardiologist who did her angiogram. She also needs to see a cardiologist regarding her CHF and possible workup regarding this. We will start dressing of wound with silver alginate and lap apply a very minimal compression which will help in controlling some of her edema. She is also been advised to remove her compression completely if she gets tingling and numbness in her toes or toes turn blue or if the pain increases. [name], KING ([id number]) She will come and see [place] at weekly intervals. Electronic Signature(s) Signed: 09/28/2014  12:20:11 PM By: Christin Fudge MD, FACS Entered By: Christin Fudge on 09/28/2014 11:14:43 Cary, [name] ([id number]) -------------------------------------------------------------------------------- ROS/PFSH Details Patient Name: CABRINI, WINBERRY 09/28/2014 9:00 Date of Service: AM Medical Record [id number] Number: Patient Account Number: [id number] 04/23/1932 (81 y.o. Treating RN: Junious Dresser Date of Birth/Sex: Female) Other Clinician: Primary Care Physician: Emily Filbert Treating Christin Fudge Referring Physician: Emily Filbert Physician/Extender: Suella Grove in Treatment: 0 Information Obtained From Patient Wound History Do you currently have one or more open woundso Yes How many open wounds do you currently haveo 2 Approximately how long have you had your woundso 2-3 weeks How have you been treating your wound(s) until nowo neosporin Has your wound(s) ever healed and then re-openedo Yes Have you had any lab work done in the past montho Yes Who ordered the lab work doneo Dr. Sabra Heck Have you tested positive for an antibiotic resistant organism (MRSA, VRE)o No Have you tested positive for osteomyelitis (bone infection)o No Have you had any tests for circulation on your legso Yes Who ordered the testo AVandV Have you had other problems associated with your woundso Infection, Swelling Constitutional Symptoms (General Health) Complaints and Symptoms: Positive for: Marked Weight Change - lost 25 lbs in 8 months Eyes Complaints and Symptoms: Positive for: Glasses / Contacts Medical History: Positive for: Cataracts Negative for: Glaucoma; Optic Neuritis Cardiovascular Complaints and Symptoms: Positive for: LE edema Negative for: Chest pain Medical History: Positive for: Arrhythmia - A fib; Hypertension Greenwell,  [name] ([id number]) Endocrine Complaints and Symptoms: Positive for: Thyroid disease - hypothyriodism Medical History: Negative for: Type I Diabetes; Type II  Diabetes Immunological Complaints and Symptoms: No Complaints or Symptoms Complaints and Symptoms: Negative for: Itching Medical History: Negative for: Lupus Erythematosus; Raynaudos; Scleroderma Integumentary (Skin) Complaints and Symptoms: Positive for: Wounds; Bleeding or bruising tendency; Swelling - BLE Medical History: Negative for: History of Burn; History of pressure wounds Neurologic Complaints and Symptoms: Positive for: Numbness/parasthesias - R heel Medical History: Negative for: Dementia; Neuropathy; Quadriplegia; Paraplegia; Seizure Disorder Ear/Nose/Mouth/Throat Complaints and Symptoms: No Complaints or Symptoms Medical History: Negative for: Chronic sinus problems/congestion; Middle ear problems Past Medical History Notes: HOH- hearing aids Hematologic/Lymphatic Complaints and Symptoms: No Complaints or Symptoms Medical History: Positive for: Anemia Stierwalt, Florencia A. ([id number]) Negative for: Hemophilia; Human Immunodeficiency Virus; Lymphedema; Sickle Cell Disease Respiratory Complaints and Symptoms: No Complaints or Symptoms Medical History: Negative for: Aspiration; Asthma; Chronic Obstructive Pulmonary Disease (COPD); Pneumothorax; Sleep Apnea; Tuberculosis Gastrointestinal Complaints and Symptoms: No Complaints or Symptoms Medical History: Negative for: Cirrhosis ; Colitis; Crohnos; Hepatitis A; Hepatitis B; Hepatitis C Genitourinary Complaints and Symptoms: No Complaints or Symptoms Medical History: Negative for: End Stage Renal Disease Past Medical History Notes: frequent UTIs Musculoskeletal Complaints and Symptoms: No Complaints or Symptoms Medical History: Positive for: Osteoarthritis Negative for: Gout; Rheumatoid Arthritis Oncologic Medical History: Positive for: Received Radiation Past Medical History Notes: Breast Ca- R mastectomy Psychiatric Complaints and Symptoms: No Complaints or Symptoms Medical History: ARADIA, TORMEY  ([id number]) Negative for: Anorexia/bulimia HBO Extended History Items Eyes: Cataracts Immunizations Tetanus Vaccine: Last tetanus shot: 08/15/2008 Hospitalization / Surgery History Name of Hospital Purpose of Hospitalization/Surgery Date [facility] (R) knee replacement 09/16/2014 Family and Social History Cancer: Yes - Siblings; Heart Disease: Yes - Father; Hereditary Spherocytosis: No; Hypertension: Yes - Siblings; Kidney Disease: No; Lung Disease: No; Seizures: No; Stroke: Yes - Mother; Thyroid Problems: No; Tuberculosis: No; Never smoker; Marital Status - Married; Alcohol Use: Never; Drug Use: No History; Caffeine Use: Daily; Financial Concerns: No; Food, Clothing or Shelter Needs: No; Support System Lacking: No; Transportation Concerns: No; Advanced Directives: Yes (Not Provided); Patient does not want information on Advanced Directives; Do not resuscitate: No; Living Will: Yes (Not Provided); Medical Power of Attorney: Yes (Not Provided) Physician Affirmation I have reviewed and agree with the above information. Electronic Signature(s) Signed: 09/28/2014 12:20:11 PM By: Christin Fudge MD, FACS Signed: 09/28/2014 5:11:45 PM By: Junious Dresser RN Entered By: Christin Fudge on 09/28/2014 10:13:45 Roback, [name] ([id number]) -------------------------------------------------------------------------------- SuperBill Details Patient Name: [name] Date of Service: 09/28/2014 Medical Record Number: [id number] Patient Account Number: [id number] Date of Birth/Sex: May 13, 1932 (81 y.o. 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 [id number] Chronic  systolic (congestive) heart failure Facility Procedures CPT4: Description Modifier Quantity Code [id number] - WOUND CARE VISIT-LEV 3 EST PT 1 CPT4: [id number] - [facility]BRIDE WOUND 1ST 20 SQ CM OR < 1 ICD-10 Description Diagnosis I70.238 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 L97.312 Non-pressure chronic ulcer of right ankle with fat  layer exposed Physician Procedures CPT4: Description Modifier Quantity Code [id number] - WC PHYS LEVEL 4 - EST PT 1 ICD-10  Description Diagnosis I70.238 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 L97.312 Non-pressure chronic ulcer  of right ankle with fat layer exposed [id number] Chronic systolic (congestive) heart failure CPT4: [id number] - WC PHYS DEBR WO ANESTH 20 SQ CM 1 ICD-10 Description Diagnosis I70.238 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

## 2014-09-29 ENCOUNTER — Telehealth: Payer: Self-pay | Admitting: Cardiology

## 2014-09-29 NOTE — Telephone Encounter (Signed)
Attempted to call phone was busy.

## 2014-09-29 NOTE — Telephone Encounter (Signed)
Pt has a small ulcer on her right leg. Pt says both of her legs are swollen,her doctor at [facility]  thinks she needs to see her doctor here.

## 2014-10-01 ENCOUNTER — Telehealth: Payer: Self-pay | Admitting: *Deleted

## 2014-10-01 NOTE — Telephone Encounter (Signed)
Spoke with pt, she sees dr Fletcher Anon in the [place] location for PAD, she is wanting to change to him for all of her cardiology needs. Spoke with the [place] office, okay for patient to change all of her cardiology needs to dr Fletcher Anon. The patient has an appt 11-09-14 to f/u with arida but she requested to be seen sooner due to swelling in her legs. New appt of 10-29-14 given to patient and she will be placed on the cancellation list. Patient aware of new appt time and location.

## 2014-10-02 NOTE — Telephone Encounter (Signed)
Spoke with pt, she has been seen by wound care and she has a follow up appt with dr Fletcher Anon in [place].

## 2014-10-05 ENCOUNTER — Encounter: Payer: Medicare PPO | Admitting: Surgery

## 2014-10-05 DIAGNOSIS — L97312 Non-pressure chronic ulcer of right ankle with fat layer exposed: Secondary | ICD-10-CM | POA: Diagnosis not present

## 2014-10-05 NOTE — Progress Notes (Signed)
AUSTEN, SHIBA ([id number]) Visit Report for 10/05/2014 Chief Complaint Document Details Patient Name: [name], [name] 10/05/2014 1:45 Date of Service: PM Medical Record [id number] Number: Patient Account Number: [id number] 07-09-1932 (81 y.o. Treating RN: Date of Birth/Sex: Female) Other Clinician: Primary Care Physician: Emily Filbert Treating Christin Fudge Referring Physician: Emily Filbert Physician/Extender: Suella Grove in Treatment: 1 Information Obtained from: Patient Chief Complaint Patient presents to the wound care center for a consult due non healing wound. 81 year old patient who developed a spontaneous ulcer on the right lateral ankle area for about 2 weeks now. Electronic Signature(s) Signed: 10/05/2014 4:25:54 PM By: Christin Fudge MD, FACS Entered By: Christin Fudge on 10/05/2014 14:37:00 Dunkley, [name] ([id number]) -------------------------------------------------------------------------------- Debridement Details Patient Name: [name] 10/05/2014 1:45 Date of Service: PM Medical Record [id number] Number: Patient Account Number: [id number] 07-Mar-1933 (81 y.o. Treating RN: Date of Birth/Sex: Female) Other Clinician: Primary Care Physician: Emily Filbert Treating Finlee Milo Referring Physician: Emily Filbert Physician/Extender: Suella Grove in Treatment: 1 Debridement Performed for Wound #8 Right,Lateral Malleolus Assessment: Performed By: Physician Pat Patrick., MD Debridement: Open Wound/Selective Debridement Selective Description: Pre-procedure Yes Verification/Time Out Taken: Start Time: 14:12 Pain Control: Lidocaine 4% Topical Solution Level: Non-Viable Tissue Total Area Debrided (L x 0.7 (cm) x 0.4 (cm) = 0.28 (cm) W): Tissue and other Non-Viable, Eschar, Exudate, Fibrin/Slough material debrided: Instrument: Forceps Bleeding: None End Time: 14:16 Procedural Pain: 5 Post Procedural Pain: 2 Response to Treatment: Procedure was tolerated well Post Debridement  Measurements of Total Wound Length: (cm) 0.7 Width: (cm) 0.4 Depth: (cm) 0.1 Volume: (cm) 0.022 Electronic Signature(s) Signed: 10/05/2014 4:25:54 PM By: Christin Fudge MD, FACS Entered By: Christin Fudge on 10/05/2014 14:36:49 Fakhouri, [name] ([id number]) -------------------------------------------------------------------------------- HPI Details Patient Name: [name] 10/05/2014 1:45 Date of Service: PM Medical Record [id number] Number: Patient Account Number: [id number] February 17, 1933 (81 y.o. Treating RN: Date of Birth/Sex: Female) Other Clinician: Primary Care Physician: Emily Filbert Treating Christin Fudge Referring Physician: Emily Filbert Physician/Extender: Suella Grove in Treatment: 1 History of Present Illness Location: right lateral ankle Quality: Patient reports experiencing a sharp pain to affected area(s). Severity: Patient states wound are getting worse. Duration: Patient has had the wound for < 2 weeks prior to presenting for treatment Timing: Pain in wound is constant (hurts all the time) Context: The wound appeared gradually over time Modifying Factors: Other treatment(s) tried include: treatment for UTI as prescribed by her PCP Associated Signs and Symptoms: Patient reports having difficulty standing for long periods. HPI Description: This time around she is coming with a spontaneous ulceration of her right lower extremity and this appeared recently about 2 weeks ago. She's had some pain in this leg and has had problems with sleeping at night because of the pain. She is known to wound center and has been seen in the past. [name] is a 81 year old female who has history of atrial fibrillation who originally presented a year ago ago with a large left lower extremity wound/hematoma. We took care of her for that and now her wound on her left lower extremity is now completely healed. She presented in our clinic again in 04/2013 with an open wound on her right lateral malleolus.  She does have a history of the wound at the site. She states that approximately 6-8 months before she presented in January/2015 she noticed a circular wound on her right lateral malleolus. At the time, we had tried conservative wound care and ultimately closed the wound with Grafix wound matrix application. She then had  a successful R knee replacement (May, 2015). Recently seen by her cardiologist Dr. Kathlyn Sacramento -- Noninvasive vascular evaluation showed an ABI of 0.68 on the right and 0.85 on the left. His notes read as follows: "I proceeded with angiography on May 4 which showed: 1. No significant aortoiliac disease. 2. Moderate nonobstructive disease affecting the distal right SFA and popliteal artery. One-vessel runoff below the knee via the peroneal artery with short occlusion of the anterior tibial artery and reconstitution. Plan : The right foot ulceration is in the distribution of the anterior tibial artery and thus she might benefit from revascularization of chronically occluded proximal anterior tibial artery. However, the ulceration seems to have improved with simple wound care. Thus, I recommend continuing medical therapy for now with close observation. I will have her follow-up with me in 2 months. I instructed her to notify me if there is any worsening." oPast Medical History:DDD (degenerative disc disease);Thyroid disease ; Hypertension; Gastritis ; Lumbar disc disease ; Ductal carcinoma of breast Atrial fibrillation ; Breast cancer 10/15/2013;Right lumpectomy, DCIS Past Surgical History: Hysterectomy with BSO ; Kidney surgery ; Temporomandibular joint arthroplasty; Bunion correction; Laminectomy lumbar spine; Joint replacement Right knee replacement ;Tonsillectomy; Replacement total knee Right ; Appendectomy ; Right breast lumpectomy Right ankle ulcer surgery 2013 [name] ([id number]) 10/05/2014 -- The patient has pain at rest and has symptoms of claudication. Due to her  edema we had applied a very light compression wrap which she could not tolerate and she removed it. She was asked to see her cardiologist and her's vascular interventionalists has been unable to obtain an appointment with either of them. The earliest appointment she could obtain was in a month's time. o Electronic Signature(s) Signed: 10/05/2014 4:25:54 PM By: Christin Fudge MD, FACS Entered By: Christin Fudge on 10/05/2014 14:40:12 Seider, [name] ([id number]) -------------------------------------------------------------------------------- Physical Exam Details Patient Name: RONA, HILDITCH 10/05/2014 1:45 Date of Service: PM Medical Record [id number] Number: Patient Account Number: [id number] December 22, 1932 (81 y.o. Treating RN: Date of Birth/Sex: Female) Other Clinician: Primary Care Physician: Emily Filbert Treating Christin Fudge Referring Physician: Emily Filbert Physician/Extender: Weeks in Treatment: 1 Constitutional . Pulse regular. Respirations normal and unlabored. Afebrile. . Eyes Nonicteric. Reactive to light. Ears, Nose, Mouth, and Throat Lips, teeth, and gums WNL.Marland Kitchen Moist mucosa without lesions . Neck supple and nontender. No palpable supraclavicular or cervical adenopathy. Normal sized without goiter. Respiratory WNL. No retractions.. Cardiovascular pedal pulses not palpable and her leg is warm to touch.. she has got edema of the right lower extremity especially in the lower third. This is +2 pitting.. Integumentary (Hair, Skin) the ulceration on the right lateral ankle is covered with eschar and she is very tender and it's difficult to debride this is even with local application of lidocaine gel and solution with a spray.Marland Kitchen No crepitus or fluctuance. No peri-wound warmth or erythema. No masses.Marland Kitchen Psychiatric Judgement and insight Intact.. No evidence of depression, anxiety, or agitation.. Electronic Signature(s) Signed: 10/05/2014 4:25:54 PM By: Christin Fudge MD, FACS Entered  By: Christin Fudge on 10/05/2014 14:42:09 Minassian, [name] ([id number]) -------------------------------------------------------------------------------- Physician Orders Details Patient Name: CHARLEAN, KIJOWSKI 10/05/2014 1:45 Date of Service: PM Medical Record [id number] Number: Patient Account Number: [id number] 12-10-1932 (81 y.o. Treating RN: Montey Hora Date of Birth/Sex: Female) Other Clinician: Primary Care Physician: Emily Filbert Treating Christin Fudge Referring Physician: Emily Filbert Physician/Extender: Suella Grove in Treatment: 1 Verbal / Phone Orders: Yes Clinician: Montey Hora Read Back and Verified: Yes Diagnosis Coding Wound Cleansing Wound #8  Right,Lateral Malleolus o Clean wound with Normal Saline. Anesthetic Wound #8 Right,Lateral Malleolus o Topical Lidocaine 4% cream applied to wound bed prior to debridement Primary Wound Dressing Wound #8 Right,Lateral Malleolus o Aquacel Ag Secondary Dressing Wound #8 Right,Lateral Malleolus o Boardered Foam Dressing Dressing Change Frequency Wound #8 Right,Lateral Malleolus o Change dressing every other day. Follow-up Appointments Wound #8 Right,Lateral Malleolus o Return Appointment in 1 week. Electronic Signature(s) Signed: 10/05/2014 4:25:54 PM By: Christin Fudge MD, FACS Signed: 10/05/2014 5:02:38 PM By: Montey Hora Entered By: Montey Hora on 10/05/2014 14:17:54 Guardia, [name] ([id number]) -------------------------------------------------------------------------------- Problem List Details Patient Name: LONETTE, SAVEL 10/05/2014 1:45 Date of Service: PM Medical Record [id number] Number: Patient Account Number: [id number] 04-Apr-1933 (81 y.o. Treating RN: Date of Birth/Sex: Female) Other Clinician: Primary Care Physician: Emily Filbert Treating Christin Fudge Referring Physician: Emily Filbert Physician/Extender: Suella Grove in Treatment: 1 Active Problems ICD-10 Encounter Code Description Active  Date Diagnosis I70.238 Atherosclerosis of native arteries of right leg with 09/28/2014 Yes ulceration of other part of lower right leg L97.312 Non-pressure chronic ulcer of right ankle with fat layer 09/28/2014 Yes exposed [id number] Chronic systolic (congestive) heart failure 09/28/2014 Yes Inactive Problems Resolved Problems Electronic Signature(s) Signed: 10/05/2014 4:25:54 PM By: Christin Fudge MD, FACS Entered By: Christin Fudge on 10/05/2014 14:35:53 Dykman, [name] ([id number]) -------------------------------------------------------------------------------- Progress Note Details Patient Name: [name] 10/05/2014 1:45 Date of Service: PM Medical Record [id number] Number: Patient Account Number: [id number] [birth year] (81 y.o. Treating RN: Date of Birth/Sex: Female) Other Clinician: Primary Care Physician: Emily Filbert Treating Christin Fudge Referring Physician: Emily Filbert Physician/Extender: Suella Grove in Treatment: 1 Subjective Chief Complaint Information obtained from Patient Patient presents to the wound care center for a consult due non healing wound. 81 year old patient who developed a spontaneous ulcer on the right lateral ankle area for about 2 weeks now. History of Present Illness (HPI) The following HPI elements were documented for the patient's wound: Location: right lateral ankle Quality: Patient reports experiencing a sharp pain to affected area(s). Severity: Patient states wound are getting worse. Duration: Patient has had the wound for < 2 weeks prior to presenting for treatment Timing: Pain in wound is constant (hurts all the time) Context: The wound appeared gradually over time Modifying Factors: Other treatment(s) tried include: treatment for UTI as prescribed by her PCP Associated Signs and Symptoms: Patient reports having difficulty standing for long periods. This time around she is coming with a spontaneous ulceration of her right lower extremity and this  appeared recently about 2 weeks ago. She's had some pain in this leg and has had problems with sleeping at night because of the pain. She is known to wound center and has been seen in the past. Mrs. [name] is a 81 year old female who has history of atrial fibrillation who originally presented a year ago ago with a large left lower extremity wound/hematoma. We took care of her for that and now her wound on her left lower extremity is now completely healed. She presented in our clinic again in 04/2013 with an open wound on her right lateral malleolus. She does have a history of the wound at the site. She states that approximately 6-8 months before she presented in January/2015 she noticed a circular wound on her right lateral malleolus. At the time, we had tried conservative wound care and ultimately closed the wound with Grafix wound matrix application. She then had a successful R knee replacement (May, 2015). Recently seen by her cardiologist Dr. Kathlyn Sacramento -- Noninvasive  vascular evaluation showed an ABI of 0.68 on the right and 0.85 on the left. His notes read as follows: "I proceeded with angiography on May 4 which showed: 1. No significant aortoiliac disease. 2. Moderate nonobstructive disease affecting the distal right SFA and popliteal artery. One-vessel runoff below the knee via the peroneal artery with short occlusion of the anterior tibial artery and reconstitution. Plan : The right foot ulceration is in the distribution of the anterior tibial artery and thus she might benefit from revascularization of chronically occluded proximal anterior tibial artery. However, the ulceration seems to have improved with simple wound care. Thus, I recommend continuing medical therapy for now with close observation. I will have her follow-up with me in 2 months. I instructed her to notify me if there is any IONIA, MLECZKO. ([id number]) worsening." Past Medical History:DDD (degenerative disc  disease);Thyroid disease ; Hypertension; Gastritis ; Lumbar disc disease ; Ductal carcinoma of breast Atrial fibrillation ; Breast cancer 10/15/2013;Right lumpectomy, DCIS Past Surgical History: Hysterectomy with BSO ; Kidney surgery ; Temporomandibular joint arthroplasty; Bunion correction; Laminectomy lumbar spine; Joint replacement Right knee replacement ;Tonsillectomy; Replacement total knee Right ; Appendectomy ; Right breast lumpectomy Right ankle ulcer surgery 2013 10/05/2014 -- The patient has pain at rest and has symptoms of claudication. Due to her edema we had applied a very light compression wrap which she could not tolerate and she removed it. She was asked to see her cardiologist and her's vascular interventionalists has been unable to obtain an appointment with either of them. The earliest appointment she could obtain was in a month's time. Objective Constitutional Pulse regular. Respirations normal and unlabored. Afebrile. Vitals Time Taken: 1:55 PM, Height: 68 in, Weight: 153.8 lbs, BMI: 23.4, Temperature: 97.4 F, Pulse: 51 bpm, Respiratory Rate: 16 breaths/min, Blood Pressure: 135/47 mmHg. Eyes Nonicteric. Reactive to light. Ears, Nose, Mouth, and Throat Lips, teeth, and gums WNL.Marland Kitchen Moist mucosa without lesions . Neck supple and nontender. No palpable supraclavicular or cervical adenopathy. Normal sized without goiter. Respiratory WNL. No retractions.. Cardiovascular pedal pulses not palpable and her leg is warm to touch.. she has got edema of the right lower extremity especially in the lower third. This is +2 pitting.Marland Kitchen Psychiatric Silvio, EMERLYNN BAGNATO ([id number]) Judgement and insight Intact.. No evidence of depression, anxiety, or agitation.. Integumentary (Hair, Skin) the ulceration on the right lateral ankle is covered with eschar and she is very tender and it's difficult to debride this is even with local application of lidocaine gel and solution with a spray.Marland Kitchen No  crepitus or fluctuance. No peri-wound warmth or erythema. No masses.. Wound #8 status is Open. Original cause of wound was Gradually Appeared. The wound is located on the Right,Lateral Malleolus. The wound measures 0.7cm length x 0.4cm width x 0.1cm depth; 0.22cm^2 area and 0.022cm^3 volume. The wound is limited to skin breakdown. There is a small amount of serous drainage noted. The wound margin is indistinct and nonvisible. There is no granulation within the wound bed. There is a large (67-100%) amount of necrotic tissue within the wound bed including Adherent Slough. The periwound skin appearance exhibited: Localized Edema, Dry/Scaly, Erythema. The periwound skin appearance did not exhibit: Callus, Crepitus, Excoriation, Fluctuance, Friable, Induration, Rash, Scarring, Maceration, Moist, Atrophie Blanche, Cyanosis, Ecchymosis, Hemosiderin Staining, Mottled, Pallor, Rubor. The surrounding wound skin color is noted with erythema. Periwound temperature was noted as No Abnormality. The periwound has tenderness on palpation. Assessment Active Problems ICD-10 I70.238 - Atherosclerosis of native arteries of right leg with ulceration  of other part of lower right leg L97.312 - Non-pressure chronic ulcer of right ankle with fat layer exposed [id number] - Chronic systolic (congestive) heart failure The patient has a mixed picture of arterial insufficiency and congestive heart failure. She is unable to get a early appointment with her cardiologist and her vascular interventionlist. I will try and speak to Dr. Emily Filbert to see if he has any suggestions. I first recommended silver alginate to the bordered foam dressing. Have also recommended elevation of the limbs whenever possible and to see her cardiologist as soon as possible. I have asked her to see me back next week but due to socio-economic reasons she can only come once in 2 weeks. Procedures Wound #8 Vecchio, Ruvi A. ([id number]) Wound #8 is a To be  determined located on the Right,Lateral Malleolus . There was a Non-Viable Tissue Open Wound/Selective [phone] debridement with total area of 0.28 sq cm performed by Oluwatosin Higginson, Jackson Latino., MD. with the following instrument(s): Forceps to remove Non-Viable tissue/material including Exudate, Fibrin/Slough, and Eschar after achieving pain control using Lidocaine 4% Topical Solution. A time out was conducted prior to the start of the procedure. There was no bleeding. The procedure was tolerated well with a pain level of 5 throughout and a pain level of 2 following the procedure. Post Debridement Measurements: 0.7cm length x 0.4cm width x 0.1cm depth; 0.022cm^3 volume. Plan Wound Cleansing: Wound #8 Right,Lateral Malleolus: Clean wound with Normal Saline. Anesthetic: Wound #8 Right,Lateral Malleolus: Topical Lidocaine 4% cream applied to wound bed prior to debridement Primary Wound Dressing: Wound #8 Right,Lateral Malleolus: Aquacel Ag Secondary Dressing: Wound #8 Right,Lateral Malleolus: Boardered Foam Dressing Dressing Change Frequency: Wound #8 Right,Lateral Malleolus: Change dressing every other day. Follow-up Appointments: Wound #8 Right,Lateral Malleolus: Return Appointment in 1 week. The patient has a mixed picture of arterial insufficiency and congestive heart failure. She is unable to get a early appointment with her cardiologist and her vascular interventionlist. I will try and speak to Dr. Emily Filbert to see if he has any suggestions. I first recommended silver alginate to the bordered foam dressing. Have also recommended elevation of the limbs whenever possible and to see her cardiologist as soon as possible. I have asked her to see me back next week but due to socio-economic reasons she can only come once in 2 weeks. Electronic Signature(s) MERIDEL, ALBRACHT ([id number]) Signed: 10/05/2014 4:25:54 PM By: Christin Fudge MD, FACS Entered By: Christin Fudge on 10/05/2014  14:45:01 Tissue, [name] ([id number]) -------------------------------------------------------------------------------- SuperBill Details Patient Name: [name] Date of Service: 10/05/2014 Medical Record Number: [id number] Patient Account Number: [id number] Date of Birth/Sex: 1932-10-10 (81 y.o. Female) Treating RN: Primary Care Physician: Emily Filbert Other Clinician: Referring Physician: Emily Filbert Treating Physician/Extender: Frann Rider in Treatment: 1 Diagnosis Coding ICD-10 Codes Code Description [phone] 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 L97.312 Non-pressure chronic ulcer of right ankle with fat layer exposed [id number] Chronic systolic (congestive) heart failure Facility Procedures CPT4: Description Modifier Quantity Code [id number] - DEBRIDE WOUND 1ST 20 SQ CM OR < 1 ICD-10 Description Diagnosis I70.238 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 L97.312 Non-pressure chronic  ulcer of right ankle with fat layer exposed [id number] Chronic systolic (congestive) heart failure Physician Procedures CPT4: Description Modifier Quantity Code [id number] - WC PHYS DEBR WO ANESTH 20 SQ CM 1 ICD-10 Description Diagnosis I70.238 Atherosclerosis of native arteries  of right leg with ulceration of other part of lower right leg L97.312 Non-pressure chronic  ulcer of right ankle with fat layer exposed [id number] Chronic systolic (congestive) heart failure Electronic Signature(s) Signed: 10/05/2014 4:25:54 PM By: Christin Fudge MD, FACS Entered By: Christin Fudge on 10/05/2014 14:46:26

## 2014-10-06 NOTE — Progress Notes (Signed)
ONETTA, WUJEK ([id number]) Visit Report for 10/05/2014 Arrival Information Details Patient Name: [name], [name]. Date of Service: 10/05/2014 1:45 PM Medical Record Number: [id number] Patient Account Number: [id number] Date of Birth/Sex: 1933-02-15 (81 y.o. Female) Treating RN: Cornell Barman Primary Care Physician: Emily Filbert Other Clinician: Referring Physician: Emily Filbert Treating Physician/Extender: Frann Rider in Treatment: 1 Visit Information History Since Last Visit Added or deleted any medications: No Patient Arrived: Ambulatory Any new allergies or adverse reactions: No Arrival Time: 13:51 Had a fall or experienced change in No Accompanied By: self activities of daily living that may affect Transfer Assistance: None risk of falls: Patient Identification Verified: Yes Signs or symptoms of abuse/neglect since last No Secondary Verification Process Yes visito Completed: Hospitalized since last visit: No Patient Has Alerts: Yes Has Dressing in Place as Prescribed: Yes Patient Alerts: Patient on Blood Has Compression in Place as Prescribed: No Thinner Pain Present Now: Yes ASA 81 mg NO BP (R) ARM 11/15 ABI L-1.07 R-1.02 Electronic Signature(s) Signed: 10/05/2014 5:13:21 PM By: Gretta Cool, RN, BSN, Kim RN, BSN Entered By: Gretta Cool, RN, BSN, Kim on 10/05/2014 13:54:40 Leatherwood, [name] ([id number]) -------------------------------------------------------------------------------- Encounter Discharge Information Details Patient Name: [name]. Date of Service: 10/05/2014 1:45 PM Medical Record Number: [id number] Patient Account Number: [id number] Date of Birth/Sex: December 17, 1932 (81 y.o. Female) Treating RN: Primary Care Physician: Emily Filbert Other Clinician: Referring Physician: Emily Filbert Treating Physician/Extender: Frann Rider in Treatment: 1 Encounter Discharge Information Items Schedule Follow-up Appointment: No Medication Reconciliation completed No and provided  to Patient/Care Verdene Creson: Provided on Clinical Summary of Care: 10/05/2014 Form Type Recipient Paper Patient JW Electronic Signature(s) Signed: 10/05/2014 2:25:20 PM By: Ruthine Dose Entered By: Ruthine Dose on 10/05/2014 14:25:20 Maina, [name] ([id number]) -------------------------------------------------------------------------------- Lower Extremity Assessment Details Patient Name: [name]. Date of Service: 10/05/2014 1:45 PM Medical Record Number: [id number] Patient Account Number: [id number] Date of Birth/Sex: [birth year] (81 y.o. Female) Treating RN: Cornell Barman Primary Care Physician: Emily Filbert Other Clinician: Referring Physician: Emily Filbert Treating Physician/Extender: Frann Rider in Treatment: 1 Edema Assessment Assessed: [Left: No] [Right: No] E[Left: dema] [Right: :] Calf Left: Right: Point of Measurement: 33 cm From Medial Instep cm 31.8 cm Ankle Left: Right: Point of Measurement: 10 cm From Medial Instep cm 25.5 cm Vascular Assessment Claudication: Claudication Assessment [Right:None] Pulses: Posterior Tibial Palpable: [Right:Yes] Dorsalis Pedis Palpable: [Right:Yes] Extremity colors, hair growth, and conditions: Extremity Color: [Right:Hyperpigmented] Hair Growth on Extremity: [Right:Yes] Temperature of Extremity: [Right:Warm] Capillary Refill: [Right:< 3 seconds] Toe Nail Assessment Left: Right: Thick: No Discolored: No Deformed: No Improper Length and Hygiene: No Electronic Signature(s) Signed: 10/05/2014 5:13:21 PM By: Gretta Cool, RN, BSN, Kim RN, BSN Ayo, [name] ([id number]) Entered By: Gretta Cool, RN, BSN, Kim on 10/05/2014 13:58:27 Berberich, [name] ([id number]) -------------------------------------------------------------------------------- Multi Wound Chart Details Patient Name: [name]. Date of Service: 10/05/2014 1:45 PM Medical Record Number: [id number] Patient Account Number: [id number] Date of Birth/Sex: 1932/10/03 (81 y.o.  Female) Treating RN: Montey Hora Primary Care Physician: Emily Filbert Other Clinician: Referring Physician: Emily Filbert Treating Physician/Extender: Frann Rider in Treatment: 1 Vital Signs Height(in): 68 Pulse(bpm): 51 Weight(lbs): 153.8 Blood Pressure 135/47 (mmHg): Body Mass Index(BMI): 23 Temperature(F): 97.4 Respiratory Rate 16 (breaths/min): Photos: [N/A:N/A] Wound Location: Right Malleolus - Lateral N/A N/A Wounding Event: Gradually Appeared N/A N/A Primary Etiology: To be determined N/A N/A Comorbid History: Cataracts, Anemia, N/A N/A Arrhythmia, Hypertension, Osteoarthritis, Received Radiation Date Acquired: 09/14/2014 N/A N/A Weeks of Treatment: 1 N/A N/A Wound  Status: Open N/A N/A Measurements L x W x D 0.7x0.4x0.1 N/A N/A (cm) Area (cm) : 0.22 N/A N/A Volume (cm) : 0.022 N/A N/A % Reduction in Area: 20.00% N/A N/A % Reduction in Volume: 18.50% N/A N/A Classification: Full Thickness Without N/A N/A Exposed Support Structures Exudate Amount: Small N/A N/A Exudate Type: Serous N/A N/A Exudate Color: amber N/A N/A Wound Margin: Indistinct, nonvisible N/A N/A Rountree, Nevin A. ([id number]) Granulation Amount: None Present (0%) N/A N/A Necrotic Amount: Large (67-100%) N/A N/A Exposed Structures: Fascia: No N/A N/A Fat: No Tendon: No Muscle: No Joint: No Bone: No Limited to Skin Breakdown Epithelialization: None N/A N/A Periwound Skin Texture: Edema: Yes N/A N/A Excoriation: No Induration: No Callus: No Crepitus: No Fluctuance: No Friable: No Rash: No Scarring: No Periwound Skin Dry/Scaly: Yes N/A N/A Moisture: Maceration: No Moist: No Periwound Skin Color: Erythema: Yes N/A N/A Atrophie Blanche: No Cyanosis: No Ecchymosis: No Hemosiderin Staining: No Mottled: No Pallor: No Rubor: No Temperature: No Abnormality N/A N/A Tenderness on Yes N/A N/A Palpation: Wound Preparation: Ulcer Cleansing: N/A N/A Rinsed/Irrigated  with Saline Topical Anesthetic Applied: Other: Lidocaine 4% Ointment Treatment Notes Electronic Signature(s) Signed: 10/05/2014 5:02:38 PM By: Montey Hora Entered By: Montey Hora on 10/05/2014 14:09:10 Hungate, [name] ([id number]) -------------------------------------------------------------------------------- [place] Details Patient Name: [name]. Date of Service: 10/05/2014 1:45 PM Medical Record Number: [id number] Patient Account Number: [id number] Date of Birth/Sex: Sep 05, 1932 (81 y.o. Female) Treating RN: Montey Hora Primary Care Physician: Emily Filbert Other Clinician: Referring Physician: Emily Filbert Treating Physician/Extender: Frann Rider in Treatment: 1 Active Inactive Abuse / Safety / Falls / Self Care Management Nursing Diagnoses: Potential for falls Goals: Patient will remain injury free Date Initiated: 09/28/2014 Goal Status: Active Interventions: Assess fall risk on admission and as needed Notes: Nutrition Nursing Diagnoses: Potential for alteratiion in Nutrition/Potential for imbalanced nutrition Goals: Patient/caregiver agrees to and verbalizes understanding of need to use nutritional supplements and/or vitamins as prescribed Date Initiated: 09/28/2014 Goal Status: Active Interventions: Assess patient nutrition upon admission and as needed per policy Notes: Wound/Skin Impairment Nursing Diagnoses: Impaired tissue integrity Goals: Ulcer/skin breakdown will have a volume reduction of 30% by week 4 KERIANN, GALLOGLY ([id number]) Date Initiated: 09/28/2014 Goal Status: Active Interventions: Assess ulceration(s) every visit Notes: Electronic Signature(s) Signed: 10/05/2014 5:02:38 PM By: Montey Hora Entered By: Montey Hora on 10/05/2014 14:08:53 Flori, [name] ([id number]) -------------------------------------------------------------------------------- Pain Assessment Details Patient Name: [name]. Date of Service:  10/05/2014 1:45 PM Medical Record Number: [id number] Patient Account Number: [id number] Date of Birth/Sex: 08/12/1932 (81 y.o. Female) Treating RN: Cornell Barman Primary Care Physician: Emily Filbert Other Clinician: Referring Physician: Emily Filbert Treating Physician/Extender: Frann Rider in Treatment: 1 Active Problems Location of Pain Severity and Description of Pain Patient Has Paino No Site Locations With Dressing Change: Yes Duration of the Pain. Constant / Intermittento Intermittent Pain Management and Medication Current Pain Management: Notes feels like something is sticking in it. Electronic Signature(s) Signed: 10/05/2014 5:13:21 PM By: Gretta Cool, RN, BSN, Kim RN, BSN Entered By: Gretta Cool, RN, BSN, Kim on 10/05/2014 13:55:04 Croghan, [name] ([id number]) -------------------------------------------------------------------------------- Patient/Caregiver Education Details Patient Name: [name] Date of Service: 10/05/2014 1:45 PM Medical Record Number: [id number] Patient Account Number: [id number] Date of Birth/Gender: 25-Apr-1932 (81 y.o. Female) Treating RN: Montey Hora Primary Care Physician: Emily Filbert Other Clinician: Referring Physician: Emily Filbert Treating Physician/Extender: Frann Rider in Treatment: 1 Education Assessment Education Provided To: Patient Education Topics Provided Venous: Handouts: Other:  need for vein and vascular studies asap by Dr Con Memos Methods: Explain/Verbal Responses: State content correctly Wound/Skin Impairment: Handouts: Other: wound care as ordered Methods: Demonstration, Explain/Verbal Responses: State content correctly Electronic Signature(s) Signed: 10/05/2014 5:02:38 PM By: Montey Hora Entered By: Montey Hora on 10/05/2014 14:10:46 Beirne, [name] ([id number]) -------------------------------------------------------------------------------- Wound Assessment Details Patient Name: [name]. Date of Service:  10/05/2014 1:45 PM Medical Record Number: [id number] Patient Account Number: [id number] Date of Birth/Sex: July 23, 1932 (81 y.o. Female) Treating RN: Cornell Barman Primary Care Physician: Emily Filbert Other Clinician: Referring Physician: Emily Filbert Treating Physician/Extender: Frann Rider in Treatment: 1 Wound Status Wound Number: 8 Primary To be determined Etiology: Wound Location: Right Malleolus - Lateral Wound Open Wounding Event: Gradually Appeared Status: Date Acquired: 09/14/2014 Comorbid Cataracts, Anemia, Arrhythmia, Weeks Of Treatment: 1 History: Hypertension, Osteoarthritis, Received Clustered Wound: No Radiation Photos Photo Uploaded By: Gretta Cool, RN, BSN, Kim on 10/05/2014 14:06:19 Wound Measurements Length: (cm) 0.7 % Reduction in Width: (cm) 0.4 % Reduction in Depth: (cm) 0.1 Epithelializati Area: (cm) 0.22 Volume: (cm) 0.022 Area: 20% Volume: 18.5% on: None Wound Description Full Thickness Without Exposed Classification: Support Structures Wound Margin: Indistinct, nonvisible Exudate Small Amount: Exudate Type: Serous Exudate Color: amber Foul Odor After Cleansing: No Wound Bed Granulation Amount: None Present (0%) Exposed Structure Necrotic Amount: Large (67-100%) Fascia Exposed: No Kady, Alaena AMarland Kitchen ([id number]) Necrotic Quality: Adherent Slough Fat Layer Exposed: No Tendon Exposed: No Muscle Exposed: No Joint Exposed: No Bone Exposed: No Limited to Skin Breakdown Periwound Skin Texture Texture Color No Abnormalities Noted: No No Abnormalities Noted: No Callus: No Atrophie Blanche: No Crepitus: No Cyanosis: No Excoriation: No Ecchymosis: No Fluctuance: No Erythema: Yes Friable: No Hemosiderin Staining: No Induration: No Mottled: No Localized Edema: Yes Pallor: No Rash: No Rubor: No Scarring: No Temperature / Pain Moisture Temperature: No Abnormality No Abnormalities Noted: No Tenderness on Palpation: Yes Dry / Scaly:  Yes Maceration: No Moist: No Wound Preparation Ulcer Cleansing: Rinsed/Irrigated with Saline Topical Anesthetic Applied: Other: Lidocaine 4% Ointment, Electronic Signature(s) Signed: 10/05/2014 5:13:21 PM By: Gretta Cool, RN, BSN, Kim RN, BSN Entered By: Gretta Cool, RN, BSN, Kim on 10/05/2014 14:00:17 [place], [name] ([id number]) -------------------------------------------------------------------------------- [place] Details Patient Name: [name] Date of Service: 10/05/2014 1:45 PM Medical Record Number: [id number] Patient Account Number: [id number] Date of Birth/Sex: 11/30/1932 (81 y.o. Female) Treating RN: Cornell Barman Primary Care Physician: Emily Filbert Other Clinician: Referring Physician: Emily Filbert Treating Physician/Extender: Frann Rider in Treatment: 1 Vital Signs Time Taken: 13:55 Temperature (F): 97.4 Height (in): 68 Pulse (bpm): 51 Weight (lbs): 153.8 Respiratory Rate (breaths/min): 16 Body Mass Index (BMI): 23.4 Blood Pressure (mmHg): 135/47 Reference Range: 80 - 120 mg / dl Electronic Signature(s) Signed: 10/05/2014 5:13:21 PM By: Gretta Cool, RN, BSN, Kim RN, BSN Entered By: Gretta Cool, RN, BSN, Kim on 10/05/2014 13:55:41

## 2014-10-12 ENCOUNTER — Other Ambulatory Visit: Payer: Self-pay

## 2014-10-20 ENCOUNTER — Encounter: Payer: Medicare PPO | Attending: Surgery | Admitting: Surgery

## 2014-10-20 DIAGNOSIS — I5022 Chronic systolic (congestive) heart failure: Secondary | ICD-10-CM | POA: Diagnosis not present

## 2014-10-20 DIAGNOSIS — I4891 Unspecified atrial fibrillation: Secondary | ICD-10-CM | POA: Insufficient documentation

## 2014-10-20 DIAGNOSIS — Z853 Personal history of malignant neoplasm of breast: Secondary | ICD-10-CM | POA: Diagnosis not present

## 2014-10-20 DIAGNOSIS — I10 Essential (primary) hypertension: Secondary | ICD-10-CM | POA: Insufficient documentation

## 2014-10-20 DIAGNOSIS — L97312 Non-pressure chronic ulcer of right ankle with fat layer exposed: Secondary | ICD-10-CM | POA: Diagnosis present

## 2014-10-20 DIAGNOSIS — E079 Disorder of thyroid, unspecified: Secondary | ICD-10-CM | POA: Insufficient documentation

## 2014-10-20 DIAGNOSIS — I70238 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: Secondary | ICD-10-CM | POA: Insufficient documentation

## 2014-10-20 NOTE — Progress Notes (Addendum)
BISMA, HAINS ([id number]) Visit Report for 10/20/2014 Arrival Information Details Patient Name: [name]. Date of Service: 10/20/2014 2:30 PM Medical Record Number: [id number] Patient Account Number: [id number] Date of Birth/Sex: [birth year] (81 y.o. Female) Treating RN: Afful, RN, BSN, Velva Harman Primary Care Physician: Emily Filbert Other Clinician: Referring Physician: Emily Filbert Treating Physician/Extender: Frann Rider in Treatment: 3 Visit Information History Since Last Visit Any new allergies or adverse reactions: No Patient Arrived: [name] Had a fall or experienced change in No Arrival Time: 14:39 activities of daily living that may affect Accompanied By: self risk of falls: Transfer Assistance: None Signs or symptoms of abuse/neglect since last No Patient Identification Verified: Yes visito Secondary Verification Process Yes Hospitalized since last visit: No Completed: Has Dressing in Place as Prescribed: Yes Patient Has Alerts: Yes Pain Present Now: No Patient Alerts: Patient on Blood Thinner ASA 81 mg NO BP (R) ARM 11/15 ABI L-1.07 R-1.02 Electronic Signature(s) Signed: 10/20/2014 2:40:21 PM By: Regan Lemming BSN, RN Entered By: Regan Lemming on 10/20/2014 14:40:21 [name] ([id number]) -------------------------------------------------------------------------------- Clinic Level of Care Assessment Details Patient Name: [name]. Date of Service: 10/20/2014 2:30 PM Medical Record Number: [id number] Patient Account Number: [id number] Date of Birth/Sex: 1932/11/19 (81 y.o. Female) Treating RN: Afful, RN, BSN, Velva Harman Primary Care Physician: Emily Filbert Other Clinician: Referring Physician: Emily Filbert Treating Physician/Extender: Frann Rider in Treatment: 3 Clinic Level of Care Assessment Items TOOL 4 Quantity Score []  - Use when only an EandM is performed on FOLLOW-UP visit 0 ASSESSMENTS - Nursing Assessment / Reassessment X - Reassessment of  Co-morbidities (includes updates in patient status) 1 10 X - Reassessment of Adherence to Treatment Plan 1 5 ASSESSMENTS - Wound and Skin Assessment / Reassessment X - Simple Wound Assessment / Reassessment - one wound 1 5 []  - Complex Wound Assessment / Reassessment - multiple wounds 0 []  - Dermatologic / Skin Assessment (not related to wound area) 0 ASSESSMENTS - Focused Assessment []  - Circumferential Edema Measurements - multi extremities 0 []  - Nutritional Assessment / Counseling / Intervention 0 []  - Lower Extremity Assessment (monofilament, tuning fork, pulses) 0 []  - Peripheral Arterial Disease Assessment (using hand held doppler) 0 ASSESSMENTS - Ostomy and/or Continence Assessment and Care []  - Incontinence Assessment and Management 0 []  - Ostomy Care Assessment and Management (repouching, etc.) 0 PROCESS - Coordination of Care X - Simple Patient / Family Education for ongoing care 1 15 []  - Complex (extensive) Patient / Family Education for ongoing care 0 []  - Staff obtains Programmer, systems, Records, Test Results / Process Orders 0 []  - Staff telephones HHA, Nursing Homes / Clarify orders / etc 0 []  - Routine Transfer to another Facility (non-emergent condition) 0 [name] ([id number]) []  - Routine Hospital Admission (non-emergent condition) 0 []  - New Admissions / Biomedical engineer / Ordering NPWT, Apligraf, etc. 0 []  - Emergency Hospital Admission (emergent condition) 0 X - Simple Discharge Coordination 1 10 []  - Complex (extensive) Discharge Coordination 0 PROCESS - Special Needs []  - Pediatric / Minor Patient Management 0 []  - Isolation Patient Management 0 []  - Hearing / Language / Visual special needs 0 []  - Assessment of Community assistance (transportation, D/C planning, etc.) 0 []  - Additional assistance / Altered mentation 0 []  - Support Surface(s) Assessment (bed, cushion, seat, etc.) 0 INTERVENTIONS - Wound Cleansing / Measurement []  - Simple Wound Cleansing -  one wound 0 []  - Complex Wound Cleansing - multiple wounds 0 X - Wound Imaging (  photographs - any number of wounds) 1 5 []  - Wound Tracing (instead of photographs) 0 []  - Simple Wound Measurement - one wound 0 []  - Complex Wound Measurement - multiple wounds 0 INTERVENTIONS - Wound Dressings []  - Small Wound Dressing one or multiple wounds 0 []  - Medium Wound Dressing one or multiple wounds 0 []  - Large Wound Dressing one or multiple wounds 0 []  - Application of Medications - topical 0 []  - Application of Medications - injection 0 INTERVENTIONS - Miscellaneous []  - External ear exam 0 Park, Danila A. ([id number]) []  - Specimen Collection (cultures, biopsies, blood, body fluids, etc.) 0 []  - Specimen(s) / Culture(s) sent or taken to Lab for analysis 0 []  - Patient Transfer (multiple staff / Harrel Lemon Lift / Similar devices) 0 []  - Simple Staple / Suture removal (25 or less) 0 []  - Complex Staple / Suture removal (26 or more) 0 []  - Hypo / Hyperglycemic Management (close monitor of Blood Glucose) 0 []  - Ankle / Brachial Index (ABI) - do not check if billed separately 0 X - Vital Signs 1 5 Has the patient been seen at the hospital within the last three years: Yes Total Score: 55 Level Of Care: New/Established - Level 2 Electronic Signature(s) Signed: 10/20/2014 2:56:26 PM By: Regan Lemming BSN, RN Entered By: Regan Lemming on 10/20/2014 14:56:25 [name] ([id number]) -------------------------------------------------------------------------------- Encounter Discharge Information Details Patient Name: [name]. Date of Service: 10/20/2014 2:30 PM Medical Record Number: [id number] Patient Account Number: [id number] Date of Birth/Sex: November 23, 1932 (81 y.o. Female) Treating RN: Baruch Gouty, RN, BSN, Velva Harman Primary Care Physician: Emily Filbert Other Clinician: Referring Physician: Emily Filbert Treating Physician/Extender: Frann Rider in Treatment: 3 Encounter Discharge Information Items Discharge  Pain Level: 0 Discharge Condition: Stable Ambulatory Status: Cane Discharge Destination: Home Private Transportation: Auto Accompanied By: self Schedule Follow-up Appointment: No Medication Reconciliation completed and No provided to Patient/Care Apryll Hinkle: Clinical Summary of Care: Electronic Signature(s) Signed: 10/20/2014 2:59:55 PM By: Regan Lemming BSN, RN Entered By: Regan Lemming on 10/20/2014 14:59:55 Dick, Derry Bailey ([id number]) -------------------------------------------------------------------------------- Lower Extremity Assessment Details Patient Name: [name]. Date of Service: 10/20/2014 2:30 PM Medical Record Number: [id number] Patient Account Number: [id number] Date of Birth/Sex: Mar 14, 1933 (81 y.o. Female) Treating RN: Afful, RN, BSN, Velva Harman Primary Care Physician: Emily Filbert Other Clinician: Referring Physician: Emily Filbert Treating Physician/Extender: Frann Rider in Treatment: 3 Edema Assessment Assessed: Shirlyn Goltz: No] [Right: No] E[Left: dema] [Right: :] Calf Left: Right: Point of Measurement: 33 cm From Medial Instep cm cm Ankle Left: Right: Point of Measurement: 10 cm From Medial Instep cm cm Vascular Assessment Claudication: Claudication Assessment [Right:None] Pulses: Posterior Tibial Dorsalis Pedis Palpable: [Right:Yes] Extremity colors, hair growth, and conditions: Extremity Color: [Right:Normal] Hair Growth on Extremity: [Right:No] Temperature of Extremity: [Right:Warm] Capillary Refill: [Right:< 3 seconds] Dependent Rubor: [Right:No] Blanched when Elevated: [Right:No] Lipodermatosclerosis: [Right:No] Toe Nail Assessment Left: Right: Thick: No Discolored: No Deformed: No Improper Length and Hygiene: No NATORI, HODGENS ([id number]) Electronic Signature(s) Signed: 10/20/2014 2:46:08 PM By: Regan Lemming BSN, RN Entered By: Regan Lemming on 10/20/2014 14:46:07 Gowans, Derry Bailey  ([id number]) -------------------------------------------------------------------------------- Multi Wound Chart Details Patient Name: [name]. Date of Service: 10/20/2014 2:30 PM Medical Record Number: [id number] Patient Account Number: [id number] Date of Birth/Sex: 1933-01-04 (81 y.o. Female) Treating RN: Baruch Gouty, RN, BSN, Velva Harman Primary Care Physician: Emily Filbert Other Clinician: Referring Physician: Emily Filbert Treating Physician/Extender: Frann Rider in Treatment: 3 Vital Signs Height(in): 68 Pulse(bpm): 54 Weight(lbs): 153.8 Blood  Pressure 153/48 (mmHg): Body Mass Index(BMI): 23 Temperature(F): 98.1 Respiratory Rate 16 (breaths/min): Photos: [8:No Photos] [N/A:N/A] Wound Location: [8:Right Malleolus - Lateral N/A] Wounding Event: [8:Gradually Appeared] [N/A:N/A] Primary Etiology: [8:Atypical] [N/A:N/A] Comorbid History: [8:Cataracts, Anemia, Arrhythmia, Hypertension, Osteoarthritis, Received Radiation] [N/A:N/A] Date Acquired: [8:09/14/2014] [N/A:N/A] Weeks of Treatment: [8:3] [N/A:N/A] Wound Status: [8:Open] [N/A:N/A] Measurements L x W x D 0.4x0.4x0.1 [N/A:N/A] (cm) Area (cm) : [8:0.126] [N/A:N/A] Volume (cm) : [8:0.013] [N/A:N/A] % Reduction in Area: [8:54.20%] [N/A:N/A] % Reduction in Volume: 51.90% [N/A:N/A] Classification: [8:Full Thickness Without Exposed Support Structures] [N/A:N/A] Exudate Amount: [8:None Present] [N/A:N/A] Wound Margin: [8:Indistinct, nonvisible] [N/A:N/A] Granulation Amount: [8:None Present (0%)] [N/A:N/A] Necrotic Amount: [8:Large (67-100%)] [N/A:N/A] Necrotic Tissue: [8:Eschar] [N/A:N/A] Exposed Structures: [8:Fascia: No Fat: No Tendon: No Muscle: No Joint: No] [N/A:N/A] Bone: No Limited to Skin Breakdown Epithelialization: None N/A N/A Periwound Skin Texture: Edema: Yes N/A N/A Excoriation: No Induration: No Callus: No Crepitus: No Fluctuance: No Friable: No Rash: No Scarring: No Periwound Skin Dry/Scaly: Yes  N/A N/A Moisture: Maceration: No Moist: No Periwound Skin Color: Erythema: Yes N/A N/A Atrophie Blanche: No Cyanosis: No Ecchymosis: No Hemosiderin Staining: No Mottled: No Pallor: No Rubor: No Temperature: No Abnormality N/A N/A Tenderness on Yes N/A N/A Palpation: Wound Preparation: Ulcer Cleansing: N/A N/A Rinsed/Irrigated with Saline Topical Anesthetic Applied: Other: Lidocaine 4% Ointment Treatment Notes Electronic Signature(s) Signed: 10/20/2014 2:51:10 PM By: Regan Lemming BSN, RN Entered By: Regan Lemming on 10/20/2014 14:51:09 Laurie, Derry Bailey ([id number]) -------------------------------------------------------------------------------- [place] Details Patient Name: [name]. Date of Service: 10/20/2014 2:30 PM Medical Record Number: [id number] Patient Account Number: [id number] Date of Birth/Sex: [birth year] (81 y.o. Female) Treating RN: Baruch Gouty, RN, BSN, Velva Harman Primary Care Physician: Emily Filbert Other Clinician: Referring Physician: Emily Filbert Treating Physician/Extender: Frann Rider in Treatment: 3 Active Inactive Electronic Signature(s) Signed: 10/20/2014 2:55:06 PM By: Regan Lemming BSN, RN Previous Signature: 10/20/2014 2:50:59 PM Version By: Regan Lemming BSN, RN Previous Signature: 10/20/2014 2:46:55 PM Version By: Regan Lemming BSN, RN Entered By: Regan Lemming on 10/20/2014 14:55:05 Recktenwald, Derry Bailey ([id number]) -------------------------------------------------------------------------------- Pain Assessment Details Patient Name: [name]. Date of Service: 10/20/2014 2:30 PM Medical Record Number: [id number] Patient Account Number: [id number] Date of Birth/Sex: [birth year] (81 y.o. Female) Treating RN: Baruch Gouty, RN, BSN, Velva Harman Primary Care Physician: Emily Filbert Other Clinician: Referring Physician: Emily Filbert Treating Physician/Extender: Frann Rider in Treatment: 3 Active Problems Location of Pain Severity and Description of Pain Patient  Has Paino No Site Locations Pain Management and Medication Current Pain Management: Electronic Signature(s) Signed: 10/20/2014 2:40:31 PM By: Regan Lemming BSN, RN Entered By: Regan Lemming on 10/20/2014 14:40:31 Rettinger, Derry Bailey ([id number]) -------------------------------------------------------------------------------- Patient/Caregiver Education Details Patient Name: [name] Date of Service: 10/20/2014 2:30 PM Medical Record Number: [id number] Patient Account Number: [id number] Date of Birth/Gender: 10-05-1932 (81 y.o. Female) Treating RN: Afful, RN, BSN, Velva Harman Primary Care Physician: Emily Filbert Other Clinician: Referring Physician: Emily Filbert Treating Physician/Extender: Frann Rider in Treatment: 3 Education Assessment Education Provided To: Patient Education Topics Provided Basic Hygiene: Methods: Explain/Verbal Responses: State content correctly Electronic Signature(s) Signed: 10/20/2014 3:00:11 PM By: Regan Lemming BSN, RN Entered By: Regan Lemming on 10/20/2014 15:00:10 Witherell, Derry Bailey ([id number]) -------------------------------------------------------------------------------- Wound Assessment Details Patient Name: [name]. Date of Service: 10/20/2014 2:30 PM Medical Record Number: [id number] Patient Account Number: [id number] Date of Birth/Sex: Nov 02, 1932 (81 y.o. Female) Treating RN: Afful, RN, BSN, Velva Harman Primary Care Physician: Emily Filbert Other Clinician: Referring Physician: Emily Filbert Treating Physician/Extender: Christin Fudge  Weeks in Treatment: 3 Wound Status Wound Number: [id number] Primary Atypical Etiology: Wound Location: Right, Lateral Malleolus Wound Healed - Epithelialized Wounding Event: Gradually Appeared Status: Date Acquired: 09/14/2014 Comorbid Cataracts, Anemia, Arrhythmia, Weeks Of Treatment: 3 History: Hypertension, Osteoarthritis, Received Clustered Wound: No Radiation Photos Photo Uploaded By: Regan Lemming on 10/20/2014 16:47:29 Wound  Measurements Length: (cm) 0 % Reduction in Width: (cm) 0 % Reduction in Depth: (cm) 0 Epithelializat Area: (cm) 0 Tunneling: Volume: (cm) 0 Undermining: Area: 100% Volume: 100% ion: None No No Wound Description Full Thickness Without Exposed Classification: Support Structures Wound Margin: Indistinct, nonvisible Exudate None Present Amount: Foul Odor After Cleansing: No Wound Bed Granulation Amount: None Present (0%) Exposed Structure Necrotic Amount: Large (67-100%) Fascia Exposed: No Necrotic Quality: Eschar Fat Layer Exposed: No Tendon Exposed: No Toops, Samhitha A. ([id number]) Muscle Exposed: No Joint Exposed: No Bone Exposed: No Limited to Skin Breakdown Periwound Skin Texture Texture Color No Abnormalities Noted: No No Abnormalities Noted: No Callus: No Atrophie Blanche: No Crepitus: No Cyanosis: No Excoriation: No Ecchymosis: No Fluctuance: No Erythema: Yes Friable: No Hemosiderin Staining: No Induration: No Mottled: No Localized Edema: Yes Pallor: No Rash: No Rubor: No Scarring: No Temperature / Pain Moisture Temperature: No Abnormality No Abnormalities Noted: No Tenderness on Palpation: Yes Dry / Scaly: Yes Maceration: No Moist: No Wound Preparation Ulcer Cleansing: Rinsed/Irrigated with Saline Topical Anesthetic Applied: Other: Lidocaine 4% Ointment, Electronic Signature(s) Signed: 10/20/2014 4:48:17 PM By: Regan Lemming BSN, RN Previous Signature: 10/20/2014 2:46:49 PM Version By: Regan Lemming BSN, RN Entered By: Regan Lemming on 10/20/2014 14:54:34 Vallejo, Derry Bailey ([id number]) -------------------------------------------------------------------------------- Vitals Details Patient Name: [name]. Date of Service: 10/20/2014 2:30 PM Medical Record Number: [id number] Patient Account Number: [id number] Date of Birth/Sex: Jul 29, 1932 (81 y.o. Female) Treating RN: Afful, RN, BSN, [place] Primary Care Physician: Emily Filbert Other Clinician: Referring  Physician: Emily Filbert Treating Physician/Extender: Frann Rider in Treatment: 3 Vital Signs Time Taken: 14:30 Temperature (F): 98.1 Height (in): 68 Pulse (bpm): 54 Weight (lbs): 153.8 Respiratory Rate (breaths/min): 16 Body Mass Index (BMI): 23.4 Blood Pressure (mmHg): 153/48 Reference Range: 80 - 120 mg / dl Electronic Signature(s) Signed: 10/20/2014 2:45:34 PM By: Regan Lemming BSN, RN Entered By: Regan Lemming on 10/20/2014 14:45:34

## 2014-10-20 NOTE — Progress Notes (Signed)
TASHUNDA, LINEBARGER ([id number]) Visit Report for 10/20/2014 Chief Complaint Document Details Patient Name: [name], [name]. Date of Service: 10/20/2014 2:30 PM Medical Record Number: [id number] Patient Account Number: [id number] Date of Birth/Sex: [birth year] (81 y.o. Female) Treating RN: Primary Care Physician: Emily Filbert Other Clinician: Referring Physician: Emily Filbert Treating Physician/Extender: Frann Rider in Treatment: 3 Information Obtained from: Patient Chief Complaint Patient presents to the wound care center for a consult due non healing wound. 81 year old patient who developed a spontaneous ulcer on the right lateral ankle area for about 2 weeks now. Electronic Signature(s) Signed: 10/20/2014 3:45:00 PM By: Christin Fudge MD, FACS Entered By: Christin Fudge on 10/20/2014 14:59:30 Mikaelian, [name] ([id number]) -------------------------------------------------------------------------------- HPI Details Patient Name: [name]. Date of Service: 10/20/2014 2:30 PM Medical Record Number: [id number] Patient Account Number: [id number] Date of Birth/Sex: [birth year] (81 y.o. Female) Treating RN: Primary Care Physician: Emily Filbert Other Clinician: Referring Physician: Emily Filbert Treating Physician/Extender: Frann Rider in Treatment: 3 History of Present Illness Location: right lateral ankle Quality: Patient reports experiencing a sharp pain to affected area(s). Severity: Patient states wound are getting worse. Duration: Patient has had the wound for < 2 weeks prior to presenting for treatment Timing: Pain in wound is constant (hurts all the time) Context: The wound appeared gradually over time Modifying Factors: Other treatment(s) tried include: treatment for UTI as prescribed by her PCP Associated Signs and Symptoms: Patient reports having difficulty standing for long periods. HPI Description: This time around she is coming with a spontaneous ulceration of her right lower  extremity and this appeared recently about 2 weeks ago. She's had some pain in this leg and has had problems with sleeping at night because of the pain. She is known to wound center and has been seen in the past. Mrs. Fairbairn is a 81 year old female who has history of atrial fibrillation who originally presented a year ago ago with a large left lower extremity wound/hematoma. We took care of her for that and now her wound on her left lower extremity is now completely healed. She presented in our clinic again in 04/2013 with an open wound on her right lateral malleolus. She does have a history of the wound at the site. She states that approximately 6-8 months before she presented in January/2015 she noticed a circular wound on her right lateral malleolus. At the time, we had tried conservative wound care and ultimately closed the wound with Grafix wound matrix application. She then had a successful R knee replacement (May, 2015). Recently seen by her cardiologist Dr. Kathlyn Sacramento -- Noninvasive vascular evaluation showed an ABI of 0.68 on the right and 0.85 on the left. His notes read as follows: "I proceeded with angiography on May 4 which showed: 1. No significant aortoiliac disease. 2. Moderate nonobstructive disease affecting the distal right SFA and popliteal artery. One-vessel runoff below the knee via the peroneal artery with short occlusion of the anterior tibial artery and reconstitution. Plan : The right foot ulceration is in the distribution of the anterior tibial artery and thus she might benefit from revascularization of chronically occluded proximal anterior tibial artery. However, the ulceration seems to have improved with simple wound care. Thus, I recommend continuing medical therapy for now with close observation. I will have her follow-up with me in 2 months. I instructed her to notify me if there is any worsening." oPast Medical History:DDD (degenerative disc disease);Thyroid  disease ; Hypertension; Gastritis ; Lumbar disc disease ; Ductal carcinoma of  breast Atrial fibrillation ; Breast cancer 10/15/2013;Right lumpectomy, DCIS Past Surgical History: Hysterectomy with BSO ; Kidney surgery ; Temporomandibular joint arthroplasty; Bunion correction; Laminectomy lumbar spine; Joint replacement Right knee replacement ;Tonsillectomy; Replacement total knee Right ; Appendectomy ; Right breast lumpectomy Right ankle ulcer surgery 2013 10/05/2014 -- The patient has pain at rest and has symptoms of claudication. Due to her edema we had applied a very light compression wrap which she could not tolerate and she removed it. She was asked to ZILPHIA, TRESSLER ([id number]) see her cardiologist and her's vascular interventionalists has been unable to obtain an appointment with either of them. The earliest appointment she could obtain was in a month's time. 10/20/2014 -- her edema of the right lower extremity has gone down remarkably and the ulcerated area on the right lateral ankle has healed completely with a fairly dense eschar. o Electronic Signature(s) Signed: 10/20/2014 3:45:00 PM By: Christin Fudge MD, FACS Entered By: Christin Fudge on 10/20/2014 15:00:13 Valli, [name] ([id number]) -------------------------------------------------------------------------------- Physical Exam Details Patient Name: [name]. Date of Service: 10/20/2014 2:30 PM Medical Record Number: [id number] Patient Account Number: [id number] Date of Birth/Sex: [birth year] (81 y.o. Female) Treating RN: Primary Care Physician: Emily Filbert Other Clinician: Referring Physician: Emily Filbert Treating Physician/Extender: Frann Rider in Treatment: 3 Constitutional . Pulse regular. Respirations normal and unlabored. Afebrile. . Eyes Nonicteric. Reactive to light. Ears, Nose, Mouth, and Throat Lips, teeth, and gums WNL.Marland Kitchen Moist mucosa without lesions . Neck supple and nontender. No palpable supraclavicular or  cervical adenopathy. Normal sized without goiter. Respiratory WNL. No retractions.. Breath sounds WNL, No rubs, rales, rhonchi, or wheeze.. Cardiovascular Pedal Pulses WNL. No clubbing, cyanosis or edema. Musculoskeletal Adexa without tenderness or enlargement.. Digits and nails w/o clubbing, cyanosis, infection, petechiae, ischemia, or inflammatory conditions.. Integumentary (Hair, Skin) No suspicious lesions. there is no open wound once the eschar was removed. The central part of it is very dense and tender.. No crepitus or fluctuance. No peri-wound warmth or erythema. No masses.Marland Kitchen Psychiatric Judgement and insight Intact.. No evidence of depression, anxiety, or agitation.. Electronic Signature(s) Signed: 10/20/2014 3:45:00 PM By: Christin Fudge MD, FACS Entered By: Christin Fudge on 10/20/2014 15:01:14 Malena, [name] ([id number]) -------------------------------------------------------------------------------- Physician Orders Details Patient Name: [name]. Date of Service: 10/20/2014 2:30 PM Medical Record Patient Account Number: [id number] Number: Afful, RN, BSN, Treating RN: 1932/05/03 (81 y.o. Velva Harman Date of Birth/Sex: Female) Other Clinician: Primary Care Physician: Emily Filbert Treating Christin Fudge Referring Physician: Emily Filbert Physician/Extender: Suella Grove in Treatment: 3 Verbal / Phone Orders: Yes Clinician: Afful, RN, BSN, Rita Read Back and Verified: Yes Diagnosis Coding Primary Wound Dressing o Boardered Foam Dressing Discharge From [facility] Services o Discharge from [place] completed Electronic Signature(s) Signed: 10/20/2014 2:55:56 PM By: Regan Lemming BSN, RN Signed: 10/20/2014 3:45:00 PM By: Christin Fudge MD, FACS Entered By: Regan Lemming on 10/20/2014 14:55:55 Oberle, [name] ([id number]) -------------------------------------------------------------------------------- Problem List Details Patient Name: [name]. Date of Service: 10/20/2014  2:30 PM Medical Record Number: [id number] Patient Account Number: [id number] Date of Birth/Sex: Jun 30, 1932 (81 y.o. Female) Treating RN: Primary Care Physician: Emily Filbert Other Clinician: Referring Physician: Emily Filbert Treating Physician/Extender: Frann Rider in Treatment: 3 Active Problems ICD-10 Encounter Code Description Active Date Diagnosis I70.238 Atherosclerosis of native arteries of right leg with 09/28/2014 Yes ulceration of other part of lower right leg L97.312 Non-pressure chronic ulcer of right ankle with fat layer 09/28/2014 Yes exposed [id number] Chronic systolic (congestive) heart  failure 09/28/2014 Yes Inactive Problems Resolved Problems Electronic Signature(s) Signed: 10/20/2014 3:45:00 PM By: Christin Fudge MD, FACS Entered By: Christin Fudge on 10/20/2014 14:59:22 Kren, [name] ([id number]) -------------------------------------------------------------------------------- Progress Note Details Patient Name: [name]. Date of Service: 10/20/2014 2:30 PM Medical Record Number: [id number] Patient Account Number: [id number] Date of Birth/Sex: October 23, 1932 (81 y.o. Female) Treating RN: Primary Care Physician: Emily Filbert Other Clinician: Referring Physician: Emily Filbert Treating Physician/Extender: Frann Rider in Treatment: 3 Subjective Chief Complaint Information obtained from Patient Patient presents to the wound care center for a consult due non healing wound. 81 year old patient who developed a spontaneous ulcer on the right lateral ankle area for about 2 weeks now. History of Present Illness (HPI) The following HPI elements were documented for the patient's wound: Location: right lateral ankle Quality: Patient reports experiencing a sharp pain to affected area(s). Severity: Patient states wound are getting worse. Duration: Patient has had the wound for < 2 weeks prior to presenting for treatment Timing: Pain in wound is constant (hurts all the  time) Context: The wound appeared gradually over time Modifying Factors: Other treatment(s) tried include: treatment for UTI as prescribed by her PCP Associated Signs and Symptoms: Patient reports having difficulty standing for long periods. This time around she is coming with a spontaneous ulceration of her right lower extremity and this appeared recently about 2 weeks ago. She's had some pain in this leg and has had problems with sleeping at night because of the pain. She is known to wound center and has been seen in the past. Mrs. Adachi is a 81 year old female who has history of atrial fibrillation who originally presented a year ago ago with a large left lower extremity wound/hematoma. We took care of her for that and now her wound on her left lower extremity is now completely healed. She presented in our clinic again in 04/2013 with an open wound on her right lateral malleolus. She does have a history of the wound at the site. She states that approximately 6-8 months before she presented in January/2015 she noticed a circular wound on her right lateral malleolus. At the time, we had tried conservative wound care and ultimately closed the wound with Grafix wound matrix application. She then had a successful R knee replacement (May, 2015). Recently seen by her cardiologist Dr. Kathlyn Sacramento -- Noninvasive vascular evaluation showed an ABI of 0.68 on the right and 0.85 on the left. His notes read as follows: "I proceeded with angiography on May 4 which showed: 1. No significant aortoiliac disease. 2. Moderate nonobstructive disease affecting the distal right SFA and popliteal artery. One-vessel runoff below the knee via the peroneal artery with short occlusion of the anterior tibial artery and reconstitution. Plan : The right foot ulceration is in the distribution of the anterior tibial artery and thus she might benefit from revascularization of chronically occluded proximal anterior tibial  artery. However, the ulceration seems to have improved with simple wound care. Thus, I recommend continuing medical therapy for now with close observation. I will have her follow-up with me in 2 months. I instructed her to notify me if there is any worsening." Past Medical History:DDD (degenerative disc disease);Thyroid disease ; Hypertension; Gastritis ; Lumbar Wiest, Kolby A. ([id number]) disc disease ; Ductal carcinoma of breast Atrial fibrillation ; Breast cancer 10/15/2013;Right lumpectomy, DCIS Past Surgical History: Hysterectomy with BSO ; Kidney surgery ; Temporomandibular joint arthroplasty; Bunion correction; Laminectomy lumbar spine; Joint replacement Right knee replacement ;Tonsillectomy; Replacement total knee Right ;  Appendectomy ; Right breast lumpectomy Right ankle ulcer surgery 2013 10/05/2014 -- The patient has pain at rest and has symptoms of claudication. Due to her edema we had applied a very light compression wrap which she could not tolerate and she removed it. She was asked to see her cardiologist and her's vascular interventionalists has been unable to obtain an appointment with either of them. The earliest appointment she could obtain was in a month's time. 10/20/2014 -- her edema of the right lower extremity has gone down remarkably and the ulcerated area on the right lateral ankle has healed completely with a fairly dense eschar. Objective Constitutional Pulse regular. Respirations normal and unlabored. Afebrile. Vitals Time Taken: 2:30 PM, Height: 68 in, Weight: 153.8 lbs, BMI: 23.4, Temperature: 98.1 F, Pulse: 54 bpm, Respiratory Rate: 16 breaths/min, Blood Pressure: 153/48 mmHg. Eyes Nonicteric. Reactive to light. Ears, Nose, Mouth, and Throat Lips, teeth, and gums WNL.Marland Kitchen Moist mucosa without lesions . Neck supple and nontender. No palpable supraclavicular or cervical adenopathy. Normal sized without goiter. Respiratory WNL. No retractions.. Breath sounds WNL,  No rubs, rales, rhonchi, or wheeze.. Cardiovascular Pedal Pulses WNL. No clubbing, cyanosis or edema. Musculoskeletal Constante, TAINA DYSERT ([id number]) Adexa without tenderness or enlargement.. Digits and nails w/o clubbing, cyanosis, infection, petechiae, ischemia, or inflammatory conditions.Marland Kitchen Psychiatric Judgement and insight Intact.. No evidence of depression, anxiety, or agitation.. Integumentary (Hair, Skin) No suspicious lesions. there is no open wound once the eschar was removed. The central part of it is very dense and tender.. No crepitus or fluctuance. No peri-wound warmth or erythema. No masses.. Wound #8 status is Healed - Epithelialized. Original cause of wound was Gradually Appeared. The wound is located on the Right,Lateral Malleolus. The wound measures 0cm length x 0cm width x 0cm depth; 0cm^2 area and 0cm^3 volume. The wound is limited to skin breakdown. There is no tunneling or undermining noted. There is a none present amount of drainage noted. The wound margin is indistinct and nonvisible. There is no granulation within the wound bed. There is a large (67-100%) amount of necrotic tissue within the wound bed including Eschar. The periwound skin appearance exhibited: Localized Edema, Dry/Scaly, Erythema. The periwound skin appearance did not exhibit: Callus, Crepitus, Excoriation, Fluctuance, Friable, Induration, Rash, Scarring, Maceration, Moist, Atrophie Blanche, Cyanosis, Ecchymosis, Hemosiderin Staining, Mottled, Pallor, Rubor. The surrounding wound skin color is noted with erythema. Periwound temperature was noted as No Abnormality. The periwound has tenderness on palpation. Assessment Active Problems ICD-10 I70.238 - 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 L97.312 - Non-pressure chronic ulcer of right ankle with fat layer exposed [id number] - Chronic systolic (congestive) heart failure The patient's edema has gone down  remarkably and this has helped the ulceration on her right lateral ankle heal. Except for a dense central eschar everything else has healed nicely and since this area is very tender have not been able to remove it completely. We will consider the wound healed and she is discharged from the wound care services and asked to follow-up with her cardiologist and vascular surgeon. She will come back and see as as needed. JAIE, ANDA ([id number]) Plan Primary Wound Dressing: Boardered Foam Dressing Discharge From [facility] Services: Discharge from [place] completed The patient's edema has gone down remarkably and this has helped the ulceration on her right lateral ankle heal. Except for a dense central eschar everything else has healed nicely and since this area is very tender  have not been able to remove it completely. We will consider the wound healed and she is discharged from the wound care services and asked to follow-up with her cardiologist and vascular surgeon. She will come back and see as as needed. Electronic Signature(s) Signed: 10/20/2014 3:45:00 PM By: Christin Fudge MD, FACS Entered By: Christin Fudge on 10/20/2014 15:02:35 Rosch, [name] ([id number]) -------------------------------------------------------------------------------- SuperBill Details Patient Name: [name]. Date of Service: 10/20/2014 Medical Record Number: [id number] Patient Account Number: [id number] Date of Birth/Sex: 1933/02/27 (81 y.o. Female) Treating RN: Primary Care Physician: Emily Filbert Other Clinician: Referring Physician: Emily Filbert Treating Physician/Extender: Frann Rider in Treatment: 3 Diagnosis Coding ICD-10 Codes Code Description [phone] 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 L97.312 Non-pressure chronic ulcer of right ankle with fat layer exposed [id number] Chronic systolic (congestive) heart failure Facility  Procedures CPT4 Code: [id number] Description: [phone] - WOUND CARE VISIT-LEV 2 EST PT Modifier: Quantity: 1 Physician Procedures CPT4: Description Modifier Quantity Code [id number] - WC PHYS LEVEL 3 - EST PT 1 ICD-10 Description Diagnosis I70.238 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 L97.312 Non-pressure chronic ulcer  of right ankle with fat layer exposed [id number] Chronic systolic (congestive) heart failure Electronic Signature(s) Signed: 10/20/2014 3:45:00 PM By: Christin Fudge MD, FACS Entered By: Christin Fudge on 10/20/2014 15:02:50

## 2014-10-29 ENCOUNTER — Encounter: Payer: Self-pay | Admitting: Cardiovascular Disease

## 2014-10-29 ENCOUNTER — Ambulatory Visit (INDEPENDENT_AMBULATORY_CARE_PROVIDER_SITE_OTHER): Payer: Medicare PPO | Admitting: Cardiovascular Disease

## 2014-10-29 VITALS — BP 120/54 | HR 52 | Ht 68.0 in | Wt 149.5 lb

## 2014-10-29 DIAGNOSIS — I5032 Chronic diastolic (congestive) heart failure: Secondary | ICD-10-CM | POA: Diagnosis not present

## 2014-10-29 DIAGNOSIS — I4891 Unspecified atrial fibrillation: Secondary | ICD-10-CM | POA: Diagnosis not present

## 2014-10-29 DIAGNOSIS — I739 Peripheral vascular disease, unspecified: Secondary | ICD-10-CM

## 2014-10-29 DIAGNOSIS — I480 Paroxysmal atrial fibrillation: Secondary | ICD-10-CM

## 2014-10-29 DIAGNOSIS — I10 Essential (primary) hypertension: Secondary | ICD-10-CM | POA: Diagnosis not present

## 2014-10-29 MED ORDER — TORSEMIDE 20 MG PO TABS: 40.0000 mg | ORAL_TABLET | Freq: Every day | ORAL | Status: DC

## 2014-10-29 NOTE — Patient Instructions (Signed)
Medication Instructions:  Your physician has recommended you make the following change in your medication:  INCREASE torsemide to 40mg  once per day STOP taking metolazone   Labwork: Your physician recommends that you return for lab work in one week: BMET    Testing/Procedures: none  Follow-Up: Your physician recommends that you schedule a follow-up appointment in: four months with Dr. Fletcher Anon.    Any Other Special Instructions Will Be Listed Below (If Applicable).

## 2014-10-29 NOTE — Progress Notes (Signed)
Primary care physician: Dr. Sabra Heck   HPI  This is a pleasant 81 year old female who is here today for a follow-up visit regarding peripheral arterial disease and atrial fibrillation. She used to be followed by Dr. Stanford Breed but switched completely to [place] as she lives in [place]. She had previous previous cardioversion in 2012. She has been maintaining in sinus rhythm with amiodarone. She has no history of coronary artery disease with previous nuclear stress test in 2012. Anticoagulation with warfarin was stopped due to frequent falls. She has chronic kidney disease with chronic leg edema. She was seen in April for bilateral leg pain. She had previous right knee replacements with some improvement in her symptoms. She was noted to have chronic ulceration on the right lateral ankle for few years which has required wound care.  She had previous wounds in the left leg which has healed without problems.  Noninvasive vascular evaluation showed an ABI of 0.68 on the right and 0.85 on the left. I proceeded with angiography on May 4 which showed:  1. No significant aortoiliac disease. 2. Moderate nonobstructive disease affecting the distal right SFA and popliteal artery. One-vessel runoff below the knee via the peroneal artery with short occlusion of the anterior tibial artery and reconstitution.  The ulcer healed with a scab formation. There is no drainage anymore. The drainage seems to correlate with increased leg edema. She had worsening leg edema bilaterally after a trip 6 weeks ago. She was given metolazone once a week by Dr. Sabra Heck but she reports no response to this medication. She used to be on torsemide 40 mg once daily but currently is taking 20 mg once daily.   Allergies  Allergen Reactions  . Amlodipine Nausea And Vomiting  . Ciprofloxacin Nausea Only  . Doxycycline Itching and Other (See Comments)    shaky  . Lipitor [Atorvastatin]     Joint pain   . Macrobid [Nitrofurantoin Macrocrystal]  Rash  . Penicillins Rash  . Sulfamethoxazole-Trimethoprim Rash     Current Outpatient Prescriptions on File Prior to Visit  Medication Sig Dispense Refill  . amiodarone (PACERONE) 200 MG tablet Take 1 tablet (200 mg total) by mouth daily. 90 tablet 3  . anastrozole (ARIMIDEX) 1 MG tablet Take 1 mg by mouth Daily.     . Ascorbic Acid (VITAMIN C PO) Take 1 tablet by mouth daily.    Marland Kitchen aspirin EC 81 MG tablet Take 1 tablet (81 mg total) by mouth daily. 90 tablet 3  . calcium-vitamin D (OSCAL WITH D) 500-200 MG-UNIT per tablet Take 1 tablet by mouth daily with breakfast.    . Cholecalciferol (VITAMIN D-3 PO) Take 1 tablet by mouth daily.    Marland Kitchen docusate sodium (COLACE) 100 MG capsule Take 100 mg by mouth daily as needed for mild constipation.    . Iron-Vitamin C 65-125 MG TABS Take 1 tablet by mouth daily.    Marland Kitchen levothyroxine (SYNTHROID, LEVOTHROID) 150 MCG tablet Take 150 mcg by mouth daily.     Marland Kitchen lisinopril (PRINIVIL,ZESTRIL) 10 MG tablet Take 10 mg by mouth daily.    . metoprolol (LOPRESSOR) 100 MG tablet Take 0.5 tablets (50 mg total) by mouth 2 (two) times daily. (Patient taking differently: Take 100 mg by mouth 2 (two) times daily. )    . Multiple Vitamin (MULTIVITAMIN) tablet Take 1 tablet by mouth daily.      Marland Kitchen omeprazole (PRILOSEC) 20 MG capsule Take 20 mg by mouth daily.    . potassium chloride (KLOR-CON) 10  MEQ CR tablet Take 20 mEq by mouth daily. PT TAKES TWO EXTRA TABLETS WITH EXTRA FLUID PILL    . pravastatin (PRAVACHOL) 40 MG tablet Take 40 mg by mouth at bedtime.     Marland Kitchen venlafaxine (EFFEXOR-XR) 75 MG 24 hr capsule Take 75 mg by mouth daily.       No current facility-administered medications on file prior to visit.     Past Medical History  Diagnosis Date  . Hypertension   . Hyperlipidemia   . Hypothyroid   . Atrial fibrillation     Echocardiogram 5/12: normal LV function, moderate LAE, mild RAE, mild AI and MR and mild-to-moderate TR.  Myoview 5/ 12 showed an ejection  fraction of 73% and normal perfusion.   Patient underwent elective DCCV on 08/26/10     Past Surgical History  Procedure Laterality Date  . Abdominal hysterectomy    . Back surgery    . Total knee arthroplasty    . Tmj arthroplasty    . Tonsillectomy    . Appendectomy    . Cardioversion  11/10/2011    Procedure: CARDIOVERSION;  Surgeon: Lelon Perla, MD;  Location: [facility] OR;  Service: Cardiovascular;  Laterality: N/A;     Family History  Problem Relation Age of Onset  . Coronary artery disease Sister 68    MI  . Heart attack Father      History   Social History  . Marital Status: Married    Spouse Name: N/A  . Number of Children: 2  . Years of Education: N/A   Occupational History  . Not on file.   Social History Main Topics  . Smoking status: Never Smoker   . Smokeless tobacco: Not on file  . Alcohol Use: No  . Drug Use: Not on file  . Sexual Activity: Not on file   Other Topics Concern  . Not on file   Social History Narrative     ROS A 10 point review of system was performed. It is negative other than that mentioned in the history of present illness.   PHYSICAL EXAM   BP 120/54 mmHg  Pulse 52  Ht 5\' 8"  (1.727 m)  Wt 149 lb 8 oz (67.813 kg)  BMI 22.74 kg/m2 Constitutional: She is oriented to person, place, and time. She appears well-developed and well-nourished. No distress.  HENT: No nasal discharge.  Head: Normocephalic and atraumatic.  Eyes: Pupils are equal and round. No discharge.  Neck: Normal range of motion. Neck supple. No JVD present. No thyromegaly present.  Cardiovascular: Normal rate, regular rhythm, normal heart sounds. Exam reveals no gallop and no friction rub. No murmur heard.  Pulmonary/Chest: Effort normal and breath sounds normal. No stridor. No respiratory distress. She has no wheezes. She has no rales. She exhibits no tenderness.  Abdominal: Soft. Bowel sounds are normal. She exhibits no distension. There is no tenderness.  There is no rebound and no guarding.  Musculoskeletal: Normal range of motion. She exhibits mild edema and no tenderness.  Neurological: She is alert and oriented to person, place, and time. Coordination normal.  Skin: Skin is warm and dry. No rash noted. She is not diaphoretic. No erythema. No pallor.  Psychiatric: She has a normal mood and affect. Her behavior is normal. Judgment and thought content normal.  Vascular: Femoral pulses are normal bilaterally. Distal pulses are not palpable. There is dependent rubor. There is a small ulceration on the right lateral ankle with no warmth or discharge. There is  a dry scab.  [id number]  Bradycardia  -  Diffuse nonspecific T-abnormality.   ABNORMAL     ASSESSMENT AND PLAN

## 2014-10-29 NOTE — Assessment & Plan Note (Addendum)
She is maintaining in sinus rhythm with amiodarone. She continues to have frequent falls and thus she is not on anticoagulation. She had labs including liver profile and thyroid function in March which were unremarkable. Continue same medications.  she is mildly bradycardic, I will consider decreasing the dose of metoprolol upon follow-up.

## 2014-10-29 NOTE — Assessment & Plan Note (Signed)
She has no clear symptoms of claudication. The ulceration on the right lateral heel is likely venous and not arterial. Continue medical therapy.

## 2014-10-29 NOTE — Assessment & Plan Note (Addendum)
She has increased leg edema. She did not respond to metolazone. Due to chronic kidney disease, she probably will require a higher dose of loop diuretic. Thus, I increased the dose of torsemide to 40 mg once daily and discontinued metolazone. Check basic metabolic profile in one week.

## 2014-11-05 ENCOUNTER — Other Ambulatory Visit (INDEPENDENT_AMBULATORY_CARE_PROVIDER_SITE_OTHER): Payer: Medicare PPO | Admitting: *Deleted

## 2014-11-05 DIAGNOSIS — I10 Essential (primary) hypertension: Secondary | ICD-10-CM

## 2014-11-06 ENCOUNTER — Other Ambulatory Visit: Payer: Self-pay

## 2014-11-06 DIAGNOSIS — N289 Disorder of kidney and ureter, unspecified: Secondary | ICD-10-CM

## 2014-11-06 LAB — BASIC METABOLIC PANEL
BUN/Creatinine Ratio: 16 (ref 11–26)
BUN: 40 mg/dL — AB (ref 8–27)
CO2: 26 mmol/L (ref 18–29)
Calcium: 9.1 mg/dL (ref 8.7–10.3)
Chloride: 95 mmol/L — ABNORMAL LOW (ref 97–108)
Creatinine, Ser: 2.5 mg/dL — ABNORMAL HIGH (ref 0.57–1.00)
GFR, EST AFRICAN AMERICAN: 20 mL/min/{1.73_m2} — AB (ref >59)
GFR, EST NON AFRICAN AMERICAN: 18 mL/min/{1.73_m2} — AB (ref >59)
Glucose: 87 mg/dL (ref 65–99)
POTASSIUM: 4.8 mmol/L (ref 3.5–5.2)
SODIUM: 140 mmol/L (ref 134–144)

## 2014-11-09 ENCOUNTER — Other Ambulatory Visit: Payer: Medicare PPO

## 2014-11-09 ENCOUNTER — Ambulatory Visit: Payer: Medicare PPO | Admitting: Cardiovascular Disease

## 2014-11-11 ENCOUNTER — Other Ambulatory Visit: Payer: Self-pay

## 2014-11-11 ENCOUNTER — Other Ambulatory Visit: Payer: Medicare PPO

## 2014-11-11 DIAGNOSIS — N289 Disorder of kidney and ureter, unspecified: Secondary | ICD-10-CM

## 2014-11-11 DIAGNOSIS — I10 Essential (primary) hypertension: Secondary | ICD-10-CM

## 2014-11-11 DIAGNOSIS — I509 Heart failure, unspecified: Secondary | ICD-10-CM

## 2014-11-12 LAB — BASIC METABOLIC PANEL
BUN/Creatinine Ratio: 21 (ref 11–26)
BUN: 34 mg/dL — AB (ref 8–27)
CO2: 25 mmol/L (ref 18–29)
Calcium: 9.4 mg/dL (ref 8.7–10.3)
Chloride: 101 mmol/L (ref 97–108)
Creatinine, Ser: 1.6 mg/dL — ABNORMAL HIGH (ref 0.57–1.00)
GFR calc Af Amer: 35 mL/min/{1.73_m2} — ABNORMAL LOW (ref >59)
GFR calc non Af Amer: 30 mL/min/{1.73_m2} — ABNORMAL LOW (ref >59)
GLUCOSE: 88 mg/dL (ref 65–99)
Potassium: 4.7 mmol/L (ref 3.5–5.2)
SODIUM: 143 mmol/L (ref 134–144)

## 2015-03-01 ENCOUNTER — Encounter: Payer: Self-pay | Admitting: Cardiovascular Disease

## 2015-03-01 ENCOUNTER — Ambulatory Visit (INDEPENDENT_AMBULATORY_CARE_PROVIDER_SITE_OTHER): Payer: Medicare PPO | Admitting: Cardiovascular Disease

## 2015-03-01 VITALS — BP 126/54 | HR 57 | Ht 68.0 in | Wt 152.5 lb

## 2015-03-01 DIAGNOSIS — I480 Paroxysmal atrial fibrillation: Secondary | ICD-10-CM

## 2015-03-01 DIAGNOSIS — I5032 Chronic diastolic (congestive) heart failure: Secondary | ICD-10-CM | POA: Diagnosis not present

## 2015-03-01 DIAGNOSIS — I10 Essential (primary) hypertension: Secondary | ICD-10-CM | POA: Diagnosis not present

## 2015-03-01 DIAGNOSIS — I739 Peripheral vascular disease, unspecified: Secondary | ICD-10-CM

## 2015-03-01 NOTE — Patient Instructions (Signed)
Medication Instructions: Continue same medications.   Labwork: None.   Procedures/Testing: None.   Follow-Up: 6 months with Dr. Lillyana Majette.   Any Additional Special Instructions Will Be Listed Below (If Applicable).   

## 2015-03-01 NOTE — Assessment & Plan Note (Signed)
She appears to be euvolemic and leg edema improved with current dose of torsemide of 30 mg once daily.

## 2015-03-01 NOTE — Assessment & Plan Note (Signed)
She has no claudication. The ulcer is venous and I advised her to continue follow-up with the wound clinic. The leg edema is minimal at the present time.

## 2015-03-01 NOTE — Assessment & Plan Note (Signed)
Blood pressure is well controlled on current medications. 

## 2015-03-01 NOTE — Assessment & Plan Note (Signed)
She is maintaining in sinus rhythm with amiodarone with no recurrent episodes. She continues to have frequent falls most recently 6 weeks ago and thus he is not on anticoagulation.

## 2015-03-01 NOTE — Progress Notes (Signed)
Primary care physician: Dr. Sabra Heck   HPI  This is a pleasant 81 year old female who is here today for a follow-up visit regarding peripheral arterial disease and atrial fibrillation.  She had previous previous cardioversion in 2012. She has been maintaining in sinus rhythm with amiodarone. She has no history of coronary artery disease with previous nuclear stress test in 2012. Anticoagulation with warfarin was stopped due to frequent falls. She has chronic kidney disease with chronic leg edema. She was seen in April for bilateral leg pain. She had previous right knee replacements with some improvement in her symptoms. She was noted to have chronic ulceration on the right lateral ankle for few years which has required wound care.  She had previous wounds in the left leg which has healed without problems.  Noninvasive vascular evaluation showed an ABI of 0.68 on the right and 0.85 on the left. Angiography in May showed:  1. No significant aortoiliac disease. 2. Moderate nonobstructive disease affecting the distal right SFA and popliteal artery. One-vessel runoff below the knee via the peroneal artery with short occlusion of the anterior tibial artery and reconstitution.  The ulcer was felt to be venous and has been asked to recurrent for the last 4 years. There is minimal drainage at the present time and she has a follow-up appointment with the wound center. Leg edema improved after decreasing the dose of torsemide. However, renal function worsened and we decreased the dose back to 20 mg once daily. She is currently taking one and a half tablet daily with minimal edema.   Allergies  Allergen Reactions  . Amlodipine Nausea And Vomiting  . Ciprofloxacin Nausea Only  . Doxycycline Itching and Other (See Comments)    shaky  . Lipitor [Atorvastatin]     Joint pain   . Macrobid [Nitrofurantoin Macrocrystal] Rash  . Penicillins Rash  . Sulfamethoxazole-Trimethoprim Rash     Current Outpatient  Prescriptions on File Prior to Visit  Medication Sig Dispense Refill  . amiodarone (PACERONE) 200 MG tablet Take 1 tablet (200 mg total) by mouth daily. 90 tablet 3  . anastrozole (ARIMIDEX) 1 MG tablet Take 1 mg by mouth Daily.     . Ascorbic Acid (VITAMIN C PO) Take 1 tablet by mouth daily.    Marland Kitchen aspirin EC 81 MG tablet Take 1 tablet (81 mg total) by mouth daily. 90 tablet 3  . calcium-vitamin D (OSCAL WITH D) 500-200 MG-UNIT per tablet Take 1 tablet by mouth daily with breakfast.    . Cholecalciferol (VITAMIN D-3 PO) Take 1 tablet by mouth daily.    Marland Kitchen docusate sodium (COLACE) 100 MG capsule Take 100 mg by mouth daily as needed for mild constipation.    . Iron-Vitamin C 65-125 MG TABS Take 1 tablet by mouth daily.    Marland Kitchen levothyroxine (SYNTHROID, LEVOTHROID) 150 MCG tablet Take 150 mcg by mouth daily.     Marland Kitchen lisinopril (PRINIVIL,ZESTRIL) 10 MG tablet Take 10 mg by mouth daily.    . metoprolol (LOPRESSOR) 100 MG tablet Take 0.5 tablets (50 mg total) by mouth 2 (two) times daily. (Patient taking differently: Take 100 mg by mouth 2 (two) times daily. )    . Multiple Vitamin (MULTIVITAMIN) tablet Take 1 tablet by mouth daily.      Marland Kitchen omeprazole (PRILOSEC) 20 MG capsule Take 20 mg by mouth daily.    . potassium chloride (KLOR-CON) 10 MEQ CR tablet Take 20 mEq by mouth daily. PT TAKES TWO EXTRA TABLETS WITH EXTRA FLUID PILL    .  pravastatin (PRAVACHOL) 40 MG tablet Take 40 mg by mouth at bedtime.     . torsemide (DEMADEX) 20 MG tablet Take 2 tablets (40 mg total) by mouth daily. 60 tablet 3  . venlafaxine (EFFEXOR-XR) 75 MG 24 hr capsule Take 75 mg by mouth daily.       No current facility-administered medications on file prior to visit.     Past Medical History  Diagnosis Date  . Hypertension   . Hyperlipidemia   . Hypothyroid   . Atrial fibrillation ([place])     Echocardiogram 5/12: normal LV function, moderate LAE, mild RAE, mild AI and MR and mild-to-moderate TR.  Myoview 5/ 12 showed an  ejection fraction of 73% and normal perfusion.   Patient underwent elective DCCV on 08/26/10     Past Surgical History  Procedure Laterality Date  . Abdominal hysterectomy    . Back surgery    . Total knee arthroplasty    . Tmj arthroplasty    . Tonsillectomy    . Appendectomy    . Cardioversion  11/10/2011    Procedure: CARDIOVERSION;  Surgeon: Lelon Perla, MD;  Location: [facility] OR;  Service: Cardiovascular;  Laterality: N/A;     Family History  Problem Relation Age of Onset  . Coronary artery disease Sister 50    MI  . Heart attack Father      Social History   Social History  . Marital Status: Married    Spouse Name: N/A  . Number of Children: 2  . Years of Education: N/A   Occupational History  . Not on file.   Social History Main Topics  . Smoking status: Never Smoker   . Smokeless tobacco: Not on file  . Alcohol Use: No  . Drug Use: Not on file  . Sexual Activity: Not on file   Other Topics Concern  . Not on file   Social History Narrative     ROS A 10 point review of system was performed. It is negative other than that mentioned in the history of present illness.   PHYSICAL EXAM   BP 126/54 mmHg  Pulse 57  Ht 5\' 8"  (1.727 m)  Wt 152 lb 8 oz (69.174 kg)  BMI 23.19 kg/m2 Constitutional: She is oriented to person, place, and time. She appears well-developed and well-nourished. No distress.  HENT: No nasal discharge.  Head: Normocephalic and atraumatic.  Eyes: Pupils are equal and round. No discharge.  Neck: Normal range of motion. Neck supple. No JVD present. No thyromegaly present.  Cardiovascular: Normal rate, regular rhythm, normal heart sounds. Exam reveals no gallop and no friction rub. No murmur heard.  Pulmonary/Chest: Effort normal and breath sounds normal. No stridor. No respiratory distress. She has no wheezes. She has no rales. She exhibits no tenderness.  Abdominal: Soft. Bowel sounds are normal. She exhibits no distension. There is  no tenderness. There is no rebound and no guarding.  Musculoskeletal: Normal range of motion. She exhibits mild edema and no tenderness.  Neurological: She is alert and oriented to person, place, and time. Coordination normal.  Skin: Skin is warm and dry. No rash noted. She is not diaphoretic. No erythema. No pallor.  Psychiatric: She has a normal mood and affect. Her behavior is normal. Judgment and thought content normal.  Vascular: Femoral pulses are normal bilaterally. Distal pulses are not palpable. There is dependent rubor. There is a small ulceration on the right lateral ankle with no warmth or discharge.   [id number]  Bradycardia  -  Diffuse nonspecific T-abnormality.   ABNORMAL     ASSESSMENT AND PLAN

## 2015-03-03 ENCOUNTER — Encounter: Payer: Medicare PPO | Attending: Surgery | Admitting: Surgery

## 2015-03-03 DIAGNOSIS — I10 Essential (primary) hypertension: Secondary | ICD-10-CM | POA: Diagnosis not present

## 2015-03-03 DIAGNOSIS — I482 Chronic atrial fibrillation: Secondary | ICD-10-CM | POA: Diagnosis not present

## 2015-03-03 DIAGNOSIS — Z853 Personal history of malignant neoplasm of breast: Secondary | ICD-10-CM | POA: Diagnosis not present

## 2015-03-03 DIAGNOSIS — L97312 Non-pressure chronic ulcer of right ankle with fat layer exposed: Secondary | ICD-10-CM | POA: Insufficient documentation

## 2015-03-03 DIAGNOSIS — Z9010 Acquired absence of unspecified breast and nipple: Secondary | ICD-10-CM | POA: Diagnosis not present

## 2015-03-03 DIAGNOSIS — M1990 Unspecified osteoarthritis, unspecified site: Secondary | ICD-10-CM | POA: Diagnosis not present

## 2015-03-03 DIAGNOSIS — I70233 Atherosclerosis of native arteries of right leg with ulceration of ankle: Secondary | ICD-10-CM | POA: Insufficient documentation

## 2015-03-06 NOTE — Progress Notes (Signed)
AKIMA, LUTZKE ([id number]) Visit Report for 03/03/2015 Allergy List Details Patient Name: [name], [name]. Date of Service: 03/03/2015 9:45 AM Medical Record Patient Account Number: [id number] Number: Treating RN: Cornell Barman Nov 12, 1932 (82 y.o. Other Clinician: Date of Birth/Sex: Female) Treating BURNS III, Primary Care Physician: Emily Filbert Physician/Extender: Thayer Jew Referring Physician: Melina Modena in Treatment: 0 Allergies Active Allergies Lipitor Reaction: joint pain Severity: Moderate Penicillins Reaction: rash Severity: Moderate Vioxx Reaction: rash Cipro Reaction: nausea doxycycline Reaction: nausea, aching, itching, shaky Severity: Moderate Active: 02/23/2014 amlodipine Reaction: Nausea and vomiting Macrobid Reaction: rash Allergy Notes Electronic Signature(s) Signed: 03/04/2015 5:49:44 PM By: Gretta Cool, RN, BSN, Kim RN, BSN [name], [name] ([id number]) Entered By: Gretta Cool RN, BSN, Kim on 03/03/2015 08:09:31 [name], [name] ([id number]) -------------------------------------------------------------------------------- Arrival Information Details Patient Name: [name]. Date of Service: 03/03/2015 9:45 AM Medical Record Patient Account Number: [id number] Number: Treating RN: Cornell Barman May 31, 1932 (82 y.o. Other Clinician: Date of Birth/Sex: Female) Treating BURNS III, Primary Care Physician: Emily Filbert Physician/Extender: Thayer Jew Referring Physician: Melina Modena in Treatment: 0 Visit Information Patient Arrived: Cane Arrival Time: 10:04 Accompanied By: self Transfer Assistance: None Patient Identification Verified: Yes Secondary Verification Process Yes Completed: Patient Has Alerts: Yes Patient Alerts: Patient on Blood Thinner 81mg  aspirin ABI 08/2014 (R) 0.68 (L) 0.85 History Since Last Visit Any new allergies or adverse reactions: No Had a fall or experienced change in activities of daily living that may affect risk of  falls: No Signs or symptoms of abuse/neglect since last visito No Hospitalized since last visit: No Electronic Signature(s) Signed: 03/04/2015 5:49:44 PM By: Gretta Cool, RN, BSN, Kim RN, BSN Entered By: Gretta Cool, RN, BSN, Kim on 03/03/2015 10:09:59 [name], [name] ([id number]) -------------------------------------------------------------------------------- Clinic Level of Care Assessment Details Patient Name: [name]. Date of Service: 03/03/2015 9:45 AM Medical Record Patient Account Number: [id number] Number: Treating RN: Cornell Barman [birth year] (82 y.o. Other Clinician: Date of Birth/Sex: Female) Treating BURNS III, Primary Care Physician: Emily Filbert Physician/Extender: Thayer Jew Referring Physician: Melina Modena in Treatment: 0 Clinic Level of Care Assessment Items TOOL 2 Quantity Score []  - Use when only an EandM is performed on the INITIAL visit 0 ASSESSMENTS - Nursing Assessment / Reassessment X - General Physical Exam (combine w/ comprehensive assessment (listed just 1 20 below) when performed on new pt. evals) X - Comprehensive Assessment (HX, ROS, Risk Assessments, Wounds Hx, etc.) 1 25 ASSESSMENTS - Wound and Skin Assessment / Reassessment X - Simple Wound Assessment / Reassessment - one wound 1 5 []  - Complex Wound Assessment / Reassessment - multiple wounds 0 []  - Dermatologic / Skin Assessment (not related to wound area) 0 ASSESSMENTS - Ostomy and/or Continence Assessment and Care []  - Incontinence Assessment and Management 0 []  - Ostomy Care Assessment and Management (repouching, etc.) 0 PROCESS - Coordination of Care X - Simple Patient / Family Education for ongoing care 1 15 []  - Complex (extensive) Patient / Family Education for ongoing care 0 X - Staff obtains Programmer, systems, Records, Test Results / Process Orders 1 10 []  - Staff telephones HHA, Nursing Homes / Clarify orders / etc 0 []  - Routine Transfer to another Facility (non-emergent condition) 0 []  -  Routine Hospital Admission (non-emergent condition) 0 []  - New Admissions / Biomedical engineer / Ordering NPWT, Apligraf, etc. 0 []  - Emergency Hospital Admission (emergent condition) 0 [name], Latoya A. ([id number]) X - Simple Discharge Coordination 1 10 []  - Complex (extensive) Discharge Coordination 0 PROCESS - Special  Needs []  - Pediatric / Minor Patient Management 0 []  - Isolation Patient Management 0 []  - Hearing / Language / Visual special needs 0 []  - Assessment of Community assistance (transportation, D/C planning, etc.) 0 []  - Additional assistance / Altered mentation 0 []  - Support Surface(s) Assessment (bed, cushion, seat, etc.) 0 INTERVENTIONS - Wound Cleansing / Measurement X - Wound Imaging (photographs - any number of wounds) 1 5 []  - Wound Tracing (instead of photographs) 0 X - Simple Wound Measurement - one wound 1 5 []  - Complex Wound Measurement - multiple wounds 0 X - Simple Wound Cleansing - one wound 1 5 []  - Complex Wound Cleansing - multiple wounds 0 INTERVENTIONS - Wound Dressings X - Small Wound Dressing one or multiple wounds 1 10 []  - Medium Wound Dressing one or multiple wounds 0 []  - Large Wound Dressing one or multiple wounds 0 []  - Application of Medications - injection 0 INTERVENTIONS - Miscellaneous []  - External ear exam 0 []  - Specimen Collection (cultures, biopsies, blood, body fluids, etc.) 0 []  - Specimen(s) / Culture(s) sent or taken to Lab for analysis 0 []  - Patient Transfer (multiple staff / Civil Service fast streamer / Similar devices) 0 []  - Simple Staple / Suture removal (25 or less) 0 [name], Latoya A. ([id number]) []  - Complex Staple / Suture removal (26 or more) 0 []  - Hypo / Hyperglycemic Management (close monitor of Blood Glucose) 0 []  - Ankle / Brachial Index (ABI) - do not check if billed separately 0 Has the patient been seen at the hospital within the last three years: Yes Total Score: 110 Level Of Care: New/Established - Level 3 Electronic  Signature(s) Signed: 03/04/2015 5:49:44 PM By: Gretta Cool, RN, BSN, Kim RN, BSN Entered By: Gretta Cool, RN, BSN, Kim on 03/03/2015 10:33:54 [name], [name] ([id number]) -------------------------------------------------------------------------------- Encounter Discharge Information Details Patient Name: [name]. Date of Service: 03/03/2015 9:45 AM Medical Record Patient Account Number: [id number] Number: Treating RN: Cornell Barman [birth year] (82 y.o. Other Clinician: Date of Birth/Sex: Female) Treating BURNS III, Primary Care Physician: Emily Filbert Physician/Extender: Thayer Jew Referring Physician: Melina Modena in Treatment: 0 Encounter Discharge Information Items Discharge Pain Level: 0 Discharge Condition: Stable Ambulatory Status: Ambulatory Discharge Destination: Home Transportation: Private Auto Accompanied By: self Schedule Follow-up Appointment: Yes Medication Reconciliation completed and provided to Patient/Care Yes [name]: Provided on Clinical Summary of Care: 03/03/2015 Form Type Recipient Paper Patient JW Electronic Signature(s) Signed: 03/03/2015 10:43:14 AM By: Ruthine Dose Entered By: Ruthine Dose on 03/03/2015 10:43:14 Hilscher, [name] ([id number]) -------------------------------------------------------------------------------- Lower Extremity Assessment Details Patient Name: [name]. Date of Service: 03/03/2015 9:45 AM Medical Record Patient Account Number: [id number] Number: Treating RN: Cornell Barman 23-Feb-1933 (82 y.o. Other Clinician: Date of Birth/Sex: Female) Treating BURNS III, Primary Care Physician: Emily Filbert Physician/Extender: Thayer Jew Referring Physician: Melina Modena in Treatment: 0 Vascular Assessment Pulses: Posterior Tibial Palpable: [Right:Yes] Dorsalis Pedis Palpable: [Right:Yes] Extremity colors, hair growth, and conditions: Extremity Color: [Right:Hyperpigmented] Hair Growth on Extremity:  [Right:No] Temperature of Extremity: [Right:Cool] Capillary Refill: [Right:< 3 seconds] Toe Nail Assessment Left: Right: Thick: No Discolored: No Deformed: No Improper Length and Hygiene: No Electronic Signature(s) Signed: 03/04/2015 5:49:44 PM By: Gretta Cool, RN, BSN, Kim RN, BSN Entered By: Gretta Cool, RN, BSN, Kim on 03/03/2015 10:09:18 [name], [name] ([id number]) -------------------------------------------------------------------------------- Multi Wound Chart Details Patient Name: [name]. Date of Service: 03/03/2015 9:45 AM Medical Record Patient Account Number: [id number] Number: Treating RN: Cornell Barman [birth year] (82 y.o. Other Clinician:  Date of Birth/Sex: Female) Treating BURNS III, Primary Care Physician: Emily Filbert Physician/Extender: Thayer Jew Referring Physician: Melina Modena in Treatment: 0 Vital Signs Height(in): 68 Pulse(bpm): 53 Weight(lbs): 149 Blood Pressure 154/56 (mmHg): Body Mass Index(BMI): 23 Temperature(F): 97.8 Respiratory Rate 18 (breaths/min): Photos: [9:No Photos] [N/A:N/A] Wound Location: [9:Malleolus - Lateral] [N/A:N/A] Wounding Event: [9:Gradually Appeared] [N/A:N/A] Primary Etiology: [9:Arterial Insufficiency Ulcer N/A] Comorbid History: [9:Cataracts, Anemia, Arrhythmia, Hypertension, Osteoarthritis, Received Radiation] [N/A:N/A] Date Acquired: [9:01/25/2015] [N/A:N/A] Weeks of Treatment: [9:0] [N/A:N/A] Wound Status: [9:Open] [N/A:N/A] Measurements L x W x D 0.3x0.2x0.1 [N/A:N/A] (cm) Area (cm) : [9:0.047] [N/A:N/A] Volume (cm) : [9:0.005] [N/A:N/A] Classification: [9:Full Thickness Without Exposed Support Structures] [N/A:N/A] Exudate Amount: [9:None Present] [N/A:N/A] Wound Margin: [9:Flat and Intact] [N/A:N/A] Granulation Amount: [9:None Present (0%)] [N/A:N/A] Necrotic Amount: [9:Large (67-100%)] [N/A:N/A] Necrotic Tissue: [9:Eschar] [N/A:N/A] Exposed Structures: [9:Fascia: No Fat: No Tendon: No Muscle: No  Joint: No] [N/A:N/A] Bone: No Limited to Skin Breakdown Periwound Skin Texture: Edema: No N/A N/A Excoriation: No Induration: No Callus: No Crepitus: No Fluctuance: No Friable: No Rash: No Scarring: No Periwound Skin Dry/Scaly: Yes N/A N/A Moisture: Maceration: No Moist: No Periwound Skin Color: Atrophie Blanche: No N/A N/A Cyanosis: No Ecchymosis: No Erythema: No Hemosiderin Staining: No Mottled: No Pallor: No Rubor: No Tenderness on No N/A N/A Palpation: Wound Preparation: Ulcer Cleansing: N/A N/A Rinsed/Irrigated with Saline Topical Anesthetic Applied: Other: lidociane 4% Treatment Notes Electronic Signature(s) Signed: 03/04/2015 5:49:44 PM By: Gretta Cool, RN, BSN, Kim RN, BSN Entered By: Gretta Cool, RN, BSN, Kim on 03/03/2015 10:18:50 [name], [name] ([id number]) -------------------------------------------------------------------------------- [place] Details Patient Name: [name]. Date of Service: 03/03/2015 9:45 AM Medical Record Patient Account Number: [id number] Number: Treating RN: Cornell Barman 21-Jul-1932 (82 y.o. Other Clinician: Date of Birth/Sex: Female) Treating BURNS III, Primary Care Physician: Emily Filbert Physician/Extender: Thayer Jew Referring Physician: Melina Modena in Treatment: 0 Active Inactive Abuse / Safety / Falls / Self Care Management Nursing Diagnoses: Potential for falls Goals: Patient will remain injury free Date Initiated: 03/03/2015 Goal Status: Active Interventions: Assess fall risk on admission and as needed Notes: Orientation to the Wound Care Program Nursing Diagnoses: Knowledge deficit related to the wound healing center program Goals: Patient/caregiver will verbalize understanding of the [place] Program Date Initiated: 03/03/2015 Goal Status: Active Interventions: Provide education on orientation to the wound center Notes: Pain, Acute or Chronic Nursing Diagnoses: Pain,  acute or chronic: actual or potential Goals: [name], [name] ([id number]) Patient will verbalize adequate pain control and receive pain control interventions during procedures as needed Date Initiated: 03/03/2015 Goal Status: Active Interventions: Complete pain assessment as per visit requirements Reposition patient for comfort Notes: Tissue Oxygenation Nursing Diagnoses: Actual ineffective tissue perfusion; peripheral (select once diagnosis is confirmed) Goals: Revascularization procedures completed as ordered Date Initiated: 03/03/2015 Goal Status: Active Interventions: Assess patient understanding of disease process and management upon diagnosis and as needed Treatment Activities: Test ordered outside of clinic : 03/03/2015 Notes: Wound/Skin Impairment Nursing Diagnoses: Impaired tissue integrity Goals: Patient/caregiver will verbalize understanding of skin care regimen Date Initiated: 03/03/2015 Goal Status: Active Ulcer/skin breakdown will heal within 14 weeks Date Initiated: 03/03/2015 Goal Status: Active Interventions: Assess ulceration(s) every visit Notes: [name], [name] ([id number]) Electronic Signature(s) Signed: 03/04/2015 5:49:44 PM By: Gretta Cool, RN, BSN, Kim RN, BSN Entered By: Gretta Cool, RN, BSN, Kim on 03/03/2015 10:18:37 [name], [name] ([id number]) -------------------------------------------------------------------------------- Pain Assessment Details Patient Name: [name]. Date of Service: 03/03/2015 9:45 AM Medical Record Patient Account Number: [id number]  Number: Treating RN: Cornell Barman [birth year] (82 y.o. Other Clinician: Date of Birth/Sex: Female) Treating BURNS III, Primary Care Physician: Emily Filbert Physician/Extender: Thayer Jew Referring Physician: Melina Modena in Treatment: 0 Active Problems Location of Pain Severity and Description of Pain Patient Has Paino No Site Locations Pain Management and Medication Current Pain  Management: Electronic Signature(s) Signed: 03/04/2015 5:49:44 PM By: Gretta Cool, RN, BSN, Kim RN, BSN Entered By: Gretta Cool, RN, BSN, Kim on 03/03/2015 10:06:04 [name] ([id number]) -------------------------------------------------------------------------------- Patient/Caregiver Education Details Patient Name: [name] Date of Service: 03/03/2015 9:45 AM Medical Record Patient Account Number: [id number] Number: Treating RN: Cornell Barman 09/29/1932 (82 y.o. Other Clinician: Date of Birth/Gender: Female) Treating BURNS III, Primary Care Physician: Emily Filbert Physician/Extender: Thayer Jew Referring Physician: Melina Modena in Treatment: 0 Education Assessment Education Provided To: Patient Education Topics Provided Wound/Skin Impairment: Handouts: Caring for Your Ulcer, Other: wound care as prescribed Methods: Demonstration, Explain/Verbal Responses: State content correctly Electronic Signature(s) Signed: 03/04/2015 5:49:44 PM By: Gretta Cool, RN, BSN, Kim RN, BSN Entered By: Gretta Cool, RN, BSN, Kim on 03/03/2015 10:36:00 [name], [name] ([id number]) -------------------------------------------------------------------------------- Wound Assessment Details Patient Name: [name]. Date of Service: 03/03/2015 9:45 AM Medical Record Patient Account Number: [id number] Number: Treating RN: Cornell Barman July 03, 1932 (82 y.o. Other Clinician: Date of Birth/Sex: Female) Treating BURNS III, Primary Care Physician: Emily Filbert Physician/Extender: Thayer Jew Referring Physician: Melina Modena in Treatment: 0 Wound Status Wound Number: 9 Primary Arterial Insufficiency Ulcer Etiology: Wound Location: Malleolus - Lateral Wound Open Wounding Event: Gradually Appeared Status: Date Acquired: 01/25/2015 Comorbid Cataracts, Anemia, Arrhythmia, Weeks Of Treatment: 0 History: Hypertension, Osteoarthritis, Received Clustered Wound: No Radiation Photos Photo Uploaded By: Gretta Cool,  RN, BSN, Kim on 03/03/2015 12:15:28 Wound Measurements Length: (cm) Width: (cm) Depth: (cm) Area: (cm) Volume: (cm) 0.3 % Reduction in Area: 0.2 % Reduction in Volume: 0.1 0.047 0.005 Wound Description Full Thickness Without Exposed Classification: Support Structures Wound Margin: Flat and Intact Exudate None Present Amount: Wound Bed Granulation Amount: None Present (0%) Exposed Structure Necrotic Amount: Large (67-100%) Fascia Exposed: No Weigelt, Jaiden A. ([id number]) Necrotic Quality: Eschar Fat Layer Exposed: No Tendon Exposed: No Muscle Exposed: No Joint Exposed: No Bone Exposed: No Limited to Skin Breakdown Periwound Skin Texture Texture Color No Abnormalities Noted: No No Abnormalities Noted: No Callus: No Atrophie Blanche: No Crepitus: No Cyanosis: No Excoriation: No Ecchymosis: No Fluctuance: No Erythema: No Friable: No Hemosiderin Staining: No Induration: No Mottled: No Localized Edema: No Pallor: No Rash: No Rubor: No Scarring: No Moisture No Abnormalities Noted: No Dry / Scaly: Yes Maceration: No Moist: No Wound Preparation Ulcer Cleansing: Rinsed/Irrigated with Saline Topical Anesthetic Applied: Other: lidociane 4%, Treatment Notes Wound #9 (Lateral Malleolus) 1. Cleansed with: Clean wound with Normal Saline 2. Anesthetic Topical Lidocaine 4% cream to wound bed prior to debridement 4. Dressing Applied: Prisma Ag 5. Secondary Dressing Applied Gauze and Kerlix/Conform 7. Secured with Financial risk analyst) Signed: 03/04/2015 5:49:44 PM By: Gretta Cool, RN, BSN, Kim RN, BSN Entered By: Gretta Cool, RN, BSN, Kim on 03/03/2015 10:13:10 [name], [name] ([id number]) Demattia, [name] ([id number]) -------------------------------------------------------------------------------- Vitals Details Patient Name: [name] Date of Service: 03/03/2015 9:45 AM Medical Record Patient Account Number: [id number] Number: Treating RN: Cornell Barman March 16, 1933 (82 y.o. Other Clinician: Date of Birth/Sex: Female) Treating BURNS III, Primary Care Physician: Emily Filbert Physician/Extender: Thayer Jew Referring Physician: Melina Modena in Treatment: 0 Vital Signs Time Taken: 10:06 Temperature (F): 97.8 Height (in): 68 Pulse (bpm): 53 Source:  Stated Respiratory Rate (breaths/min): 18 Weight (lbs): 149 Blood Pressure (mmHg): 154/56 Source: Stated Reference Range: 80 - 120 mg / dl Body Mass Index (BMI): 22.7 Electronic Signature(s) Signed: 03/04/2015 5:49:44 PM By: Gretta Cool, RN, BSN, Kim RN, BSN Entered By: Gretta Cool, RN, BSN, Kim on 03/03/2015 10:06:43

## 2015-03-06 NOTE — Progress Notes (Signed)
[name], Bailey ([id number]) Visit Report for 03/03/2015 Chief Complaint Document Details Patient Name: [name], [name]. Date of Service: 03/03/2015 9:45 AM Medical Record Patient Account Number: [id number] Number: Treating RN: [name] July 26, 1932 (82 y.o. Other Clinician: Date of Birth/Sex: Female) Treating Latoya III, Primary Care Physician/Extender: [name], [name] Physician: Referring Physician: Melina Bailey in Treatment: 0 Information Obtained from: Patient Chief Complaint Chronic right ankle ulceration. Electronic Signature(s) Signed: 03/03/2015 4:30:30 PM By: [name] Entered By: Latoya Grayer on 03/03/2015 12:56:04 [name], [name] ([id number]) -------------------------------------------------------------------------------- Debridement Details Patient Name: [name]. Date of Service: 03/03/2015 9:45 AM Medical Record Patient Account Number: [id number] Number: Treating RN: [name] 21-Mar-1933 (82 y.o. Other Clinician: Date of Birth/Sex: Female) Treating Latoya III, Primary Care Physician/Extender: [name], [name] Physician: Referring Physician: Melina Bailey in Treatment: 0 Debridement Performed for Wound #9 Lateral Malleolus Assessment: Performed By: Physician [name] Debridement: Open Wound/Selective Debridement Selective Description: Pre-procedure Yes Verification/Time Out Taken: Start Time: 10:27 Pain Control: Other : lidocaine 4% Level: Non-Viable Tissue Total Area Debrided (L x 0.3 (cm) x 0.2 (cm) = 0.06 (cm) W): Tissue and other Non-Viable, Exudate, Fibrin/Slough material debrided: Instrument: Curette Bleeding: Minimum Hemostasis Achieved: Pressure End Time: 10:30 Procedural Pain: 0 Post Procedural Pain: 0 Response to Treatment: Procedure was tolerated well Post Debridement Measurements of Total Wound Length: (cm) 0.3 Width: (cm) 0.2 Depth: (cm) 0.2 Volume: (cm)  0.009 Post Procedure Diagnosis Same as Pre-procedure Electronic Signature(s) Signed: 03/03/2015 4:30:30 PM By: [name] Signed: 03/04/2015 5:49:44 PM By: [name], [name] [name], [name] ([id number]) Entered By: Latoya Grayer on 03/03/2015 12:55:44 [name], [name] ([id number]) -------------------------------------------------------------------------------- HPI Details Patient Name: [name]. Date of Service: 03/03/2015 9:45 AM Medical Record Patient Account Number: [id number] Number: Treating RN: [name] Aug 18, 1932 (82 y.o. Other Clinician: Date of Birth/Sex: Female) Treating Latoya III, Primary Care Physician/Extender: [name], [name] Physician: Referring Physician: Melina Bailey in Treatment: 0 History of Present Illness HPI Description: Pleasant 81 year old with history of atrial fibrillation and peripheral vascular disease. She has had a chronic recurrent ulceration at her right lateral malleolus since 2014. Previously healed with Grafix application. Seen by her cardiologist Dr. Kathlyn Bailey in April 2016. Noninvasive vascular evaluation showed an ABI of 0.68 on the right and 0.85 on the left. Angiography in May 2016 which showed moderate nonobstructive disease affecting the distal right SFA and popliteal artery. One-vessel runoff below the knee via the peroneal artery with short occlusion of the anterior tibial artery and reconstitution. She developed a recurrent ulceration at her right lateral malleolus in October 2016. She reports moderate to severe pain. Possible rest pain. Ambulating per her baseline. No significant claudication. No fever or chills. Minimal drainage. o Engineer, maintenance) Signed: 03/03/2015 4:30:30 PM By: [name] Entered By: Latoya Grayer on 03/03/2015 13:03:05 [name], [name] ([id number]) -------------------------------------------------------------------------------- Physical Exam  Details Patient Name: [name] Date of Service: 03/03/2015 9:45 AM Medical Record Patient Account Number: [id number] Number: Treating RN: [name] 02-28-1933 (82 y.o. Other Clinician: Date of Birth/Sex: Female) Treating Latoya III, Primary Care Physician/Extender: [name], [name] Physician: Referring Physician: Melina Bailey in Treatment: 0 Constitutional . Pulse regular. Respirations normal and unlabored. Afebrile. Marland Kitchen Respiratory WNL. No retractions.. Cardiovascular . Integumentary (Hair, Skin) .Marland Kitchen Neurological Sensation normal to touch, pin,and vibration. Psychiatric Judgement and insight Intact.. Oriented times 3.. No evidence of depression,  anxiety, or agitation.. Notes Right lateral malleolus ulceration. Full-thickness. No exposed deep structures. No probe to bone. Minimal surrounding erythema. No significant edema. Multiple varicosities. No palpable pedal pulses. Dopplerable DP. Right ABI 0.68. Electronic Signature(s) Signed: 03/03/2015 4:30:30 PM By: [name] Entered By: Latoya Grayer on 03/03/2015 13:04:07 [name], [name] ([id number]) -------------------------------------------------------------------------------- Physician Orders Details Patient Name: [name] Date of Service: 03/03/2015 9:45 AM Medical Record Patient Account Number: [id number] Number: Treating RN: [name] 21-Feb-1933 (82 y.o. Other Clinician: Date of Birth/Sex: Female) Treating Latoya III, Primary Care Physician/Extender: [name], [name] Physician: Referring Physician: Melina Bailey in Treatment: 0 Verbal / Phone Orders: Yes Clinician: Cornell Bailey Read Back and Verified: Yes Diagnosis Coding Wound Cleansing Wound #9 Lateral Malleolus o Cleanse wound with mild soap and water o May Shower, gently pat wound dry prior to applying new dressing. Anesthetic Wound #9 Lateral Malleolus o Topical Lidocaine 4% cream applied to  wound bed prior to debridement Primary Wound Dressing Wound #9 Lateral Malleolus o Prisma Ag Secondary Dressing o ABD and Kerlix/Conform Dressing Change Frequency Wound #9 Lateral Malleolus o Change dressing every other day. Follow-up Appointments Wound #9 Lateral Malleolus o Return Appointment in 1 week. Edema Control Wound #9 Lateral Malleolus o Elevate legs to the level of the heart and pump ankles as often as possible o Tubigrip Additional Orders / Instructions Wound #9 Lateral Malleolus o Increase protein intake. o Activity as tolerated [name], [name] ([id number]) Radiology o X-ray, ankle - right oooo Electronic Signature(s) Signed: 03/03/2015 4:30:30 PM By: [name] Signed: 03/04/2015 5:49:44 PM By: [name], [name] Entered By: Latoya Cool, RN, Bailey, Latoya on 03/03/2015 10:33:19 Kohl, [name] ([id number]) -------------------------------------------------------------------------------- Problem List Details Patient Name: [name], [name]. Date of Service: 03/03/2015 9:45 AM Medical Record Patient Account Number: [id number] Number: Treating RN: [name] 03-23-1933 (82 y.o. 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 Other Clinician: Date of Birth/Sex: Female) Treating Latoya III, Primary Care Physician/Extender: [name], [name] Physician: Referring Physician: Melina Bailey in Treatment: 0 Subjective Chief Complaint Information obtained from Patient Chronic right ankle ulceration. History of Present Illness (HPI) Pleasant 81 year old with history of atrial fibrillation and peripheral vascular disease. She has had a chronic recurrent ulceration at her right lateral malleolus since 2014. Previously healed with Grafix application. Seen by her cardiologist Dr. Kathlyn Bailey in April 2016. Noninvasive vascular evaluation showed an ABI of 0.68 on the right and 0.85 on the left. Angiography in May 2016 which showed moderate nonobstructive disease affecting the distal right SFA and popliteal artery. One-vessel runoff below the knee via the peroneal artery with short occlusion of the anterior tibial artery and reconstitution. She developed a recurrent ulceration at her right lateral malleolus in October 2016. She reports  moderate to severe pain. Possible rest pain. Ambulating per her baseline. No significant claudication. No fever or chills. Minimal drainage. Wound History Patient presents with 1 open wound that has been present for approximately 1 month. Patient has been treating wound in the following manner: neosporin. The wound has been healed in the past but has re- opened. Laboratory tests have been performed in the last month. Patient reportedly has not tested positive for an antibiotic resistant organism. Patient reportedly has not tested positive for osteomyelitis.  Patient reportedly has had testing performed to evaluate circulation in the legs. Patient experiences the following problems associated with their wounds: Pain. Patient History Information obtained from Patient. Allergies Tienda, AUBRAY COMMANDER ([id number]) Lipitor (Severity: Moderate, Reaction: joint pain), Penicillins (Severity: Moderate, Reaction: rash), Vioxx (Reaction: rash), Cipro (Reaction: nausea), doxycycline (Severity: Moderate, Reaction: nausea, aching, itching, shaky), amlodipine (Reaction: Nausea and vomiting), Macrobid (Reaction: rash) Family History Cancer - Siblings, Heart Disease - Father, Hypertension - Siblings, Stroke - Mother, No family history of Hereditary Spherocytosis, Kidney Disease, Lung Disease, Seizures, Thyroid Problems, Tuberculosis. Social History Never smoker, Marital Status - Married, Alcohol Use - Never, Drug Use - No History, Caffeine Use - Daily. Medical History Eyes Patient has history of Cataracts Denies history of Glaucoma, Optic Neuritis Ear/Nose/Mouth/Throat Denies history of Chronic sinus problems/congestion, Middle ear problems Hematologic/Lymphatic Patient has history of Anemia Denies history of Hemophilia, Human Immunodeficiency Virus, Lymphedema, Sickle Cell Disease Respiratory Denies history of Aspiration, Asthma, Chronic Obstructive Pulmonary Disease (COPD), Pneumothorax, Sleep Apnea, Tuberculosis Cardiovascular Patient has history of Arrhythmia - A fib, Hypertension Denies history of Angina, Congestive Heart Failure, Coronary Artery Disease, Hypotension, Myocardial Infarction, Peripheral Arterial Disease, Peripheral Venous Disease, Phlebitis, Vasculitis Gastrointestinal Denies history of Cirrhosis , Colitis, Crohn s, Hepatitis A, Hepatitis B, Hepatitis C Endocrine Denies history of Type I Diabetes, Type II Diabetes Genitourinary Denies history of End Stage Renal Disease Immunological Denies history of Lupus Erythematosus, Raynaud s,  Scleroderma Integumentary (Skin) Denies history of History of Burn, History of pressure wounds Musculoskeletal Patient has history of Osteoarthritis Denies history of Gout, Rheumatoid Arthritis Neurologic Denies history of Dementia, Neuropathy, Quadriplegia, Paraplegia, Seizure Disorder Oncologic Patient has history of Received Radiation Denies history of Received Chemotherapy Psychiatric Denies history of Anorexia/bulimia, Confinement Anxiety Hospitalization/Surgery History - 09/16/2014, [facility], (R) knee replacement. [name], [name] ([id number]) Medical And Surgical History Notes Ear/Nose/Mouth/Throat HOH- hearing aids Genitourinary frequent UTIs Oncologic Breast Ca- R mastectomy Review of Systems (ROS) Eyes Complains or has symptoms of Glasses / Contacts. Denies complaints or symptoms of Dry Eyes, Vision Changes. Ear/Nose/Mouth/Throat The patient has no complaints or symptoms. Hematologic/Lymphatic The patient has no complaints or symptoms. Respiratory The patient has no complaints or symptoms. Cardiovascular Complains or has symptoms of LE edema - bilateral. Denies complaints or symptoms of Chest pain. Gastrointestinal The patient has no complaints or symptoms. Endocrine The patient has no complaints or symptoms. Genitourinary The patient has no complaints or symptoms. Integumentary (Skin) Complains or has symptoms of Wounds, Bleeding or bruising tendency, Swelling - lower extremities. Denies complaints or symptoms of Breakdown. Musculoskeletal The patient has no complaints or symptoms. Neurologic The patient has no complaints or symptoms. Oncologic The patient has no complaints or symptoms. Psychiatric The patient has no complaints or symptoms. Objective Constitutional Pulse regular. Respirations normal and unlabored. Afebrile. [name], [name] ([id number]) Vitals Time Taken: 10:06 AM, Height: 68 in, Source: Stated, Weight: 149 lbs, Source: Stated, BMI: 22.7, Temperature:  97.8 F, Pulse: 53 bpm, Respiratory Rate: 18 breaths/min, Blood Pressure: 154/56  mmHg. Respiratory WNL. No retractions.. Neurological Sensation normal to touch, pin,and vibration. Psychiatric Judgement and insight Intact.. Oriented times 3.. No evidence of depression, anxiety, or agitation.. General Notes: Right lateral malleolus ulceration. Full-thickness. No exposed deep structures. No probe to bone. Minimal surrounding erythema. No significant edema. Multiple varicosities. No palpable pedal pulses. Dopplerable DP. Right ABI 0.68. Integumentary (Hair, Skin) Wound #9 status is Open. Original cause of wound was Gradually Appeared. The wound is located on the Lateral Malleolus. The wound measures 0.3cm length x 0.2cm width x 0.1cm depth; 0.047cm^2 area and 0.005cm^3 volume. The wound is limited to skin breakdown. There is a none present amount of drainage noted. The wound margin is flat and intact. There is no granulation within the wound bed. There is a large (67-100%) amount of necrotic tissue within the wound bed including Eschar. The periwound skin appearance exhibited: Dry/Scaly. The periwound skin appearance did not exhibit: Callus, Crepitus, Excoriation, Fluctuance, Friable, Induration, Localized Edema, Rash, Scarring, Maceration, Moist, Atrophie Blanche, Cyanosis, Ecchymosis, Hemosiderin Staining, Mottled, Pallor, Rubor, Erythema. 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 Arterial insufficiency. [name], [name] ([id number]) Procedures Wound #9 Wound #9 is an Arterial Insufficiency Ulcer located on the Lateral Malleolus . There was a Non-Viable Tissue Open Wound/Selective [phone]) debridement with total area of 0.06 sq cm performed by Latoya  III, Teressa Senter., Bailey. with the following instrument(s): Curette to remove Non-Viable tissue/material including Exudate and Fibrin/Slough after achieving pain control using Other (lidocaine 4%). A time out was conducted prior to the start of the procedure. A Minimum amount of bleeding was controlled with Pressure. The procedure was tolerated well with a pain level of 0 throughout and a pain level of 0 following the procedure. Post Debridement Measurements: 0.3cm length x 0.2cm width x 0.2cm depth; 0.009cm^3 volume. Post procedure Diagnosis Wound #9: Same as Pre-Procedure Plan Wound Cleansing: Wound #9 Lateral Malleolus: Cleanse wound with mild soap and water May Shower, gently pat wound dry prior to applying new dressing. Anesthetic: Wound #9 Lateral Malleolus: Topical Lidocaine 4% cream applied to wound bed prior to debridement Primary Wound Dressing: Wound #9 Lateral Malleolus: Prisma Ag Secondary Dressing: ABD and Kerlix/Conform Dressing Change Frequency: Wound #9 Lateral Malleolus: Change dressing every other day. Follow-up Appointments: Wound #9 Lateral Malleolus: Return Appointment in 1 week. Edema Control: Wound #9 Lateral Malleolus: Elevate legs to the level of the heart and pump ankles as often as possible Tubigrip Additional Orders / Instructions: Wound #9 Lateral Malleolus: Increase protein intake. Activity as tolerated Radiology ordered were: X-ray, ankle - right [name], [name]. ([id number]) Promogran Prisma. Tubigrip for edema control as tolerated. X-ray to rule out underlying osteomyelitis given chronicity of ulceration. If no significant improvement we'll request vascular surgery consultation for consideration of angiogram and possible intervention. Electronic Signature(s) Signed: 03/03/2015 4:30:30 PM By: [name] Entered By: Latoya Grayer on 03/03/2015 13:06:40 [name], [name]  ([id number]) -------------------------------------------------------------------------------- ROS/PFSH Details Patient Name: [name] Date of Service: 03/03/2015 9:45 AM Medical Record Patient Account Number: [id number] Number: Treating RN: [name] 1932/08/24 (82 y.o. Other Clinician: Date of Birth/Sex: Female) Treating Latoya III, Primary Care Physician/Extender: [name], [name] Physician: Referring Physician: Melina Bailey in Treatment: 0 Label Progress Note Print Version as History and Physical for this encounter Information Obtained From  Patient Wound History Do you currently have one or more open woundso Yes How many open wounds do you currently haveo 1 Approximately how long have you had your woundso 1 month How have you been treating your wound(s) until nowo neosporin Has your wound(s) ever healed and then re-openedo Yes Have you had any lab work done in the past montho Yes Have you tested positive for an antibiotic resistant organism (MRSA, VRE)o No Have you tested positive for osteomyelitis (bone infection)o No Have you had any tests for circulation on your legso Yes Have you had other problems associated with your woundso Other: Pain Eyes Complaints and Symptoms: Positive for: Glasses / Contacts Negative for: Dry Eyes; Vision Changes Medical History: Positive for: Cataracts Negative for: Glaucoma; Optic Neuritis Cardiovascular Complaints and Symptoms: Positive for: LE edema - bilateral Negative for: Chest pain Medical History: Positive for: Arrhythmia - A fib; Hypertension Negative for: Angina; Congestive Heart Failure; Coronary Artery Disease; Hypotension; Myocardial Infarction; Peripheral Arterial Disease; Peripheral Venous Disease; Phlebitis; Vasculitis Integumentary (Skin) [name], Latoya A. ([id number]) Complaints and Symptoms: Positive for: Wounds; Bleeding or bruising tendency; Swelling - lower extremities Negative for:  Breakdown Medical History: Negative for: History of Burn; History of pressure wounds Ear/Nose/Mouth/Throat Complaints and Symptoms: No Complaints or Symptoms Medical History: Negative for: Chronic sinus problems/congestion; Middle ear problems Past Medical History Notes: HOH- hearing aids Hematologic/Lymphatic Complaints and Symptoms: No Complaints or Symptoms Medical History: Positive for: Anemia Negative for: Hemophilia; Human Immunodeficiency Virus; Lymphedema; Sickle Cell Disease Respiratory Complaints and Symptoms: No Complaints or Symptoms Medical History: Negative for: Aspiration; Asthma; Chronic Obstructive Pulmonary Disease (COPD); Pneumothorax; Sleep Apnea; Tuberculosis Gastrointestinal Complaints and Symptoms: No Complaints or Symptoms Medical History: Negative for: Cirrhosis ; Colitis; Crohnos; Hepatitis A; Hepatitis B; Hepatitis C Endocrine Complaints and Symptoms: No Complaints or Symptoms Medical History: Negative for: Type I Diabetes; Type II Diabetes [name], Latoya A. ([id number]) Genitourinary Complaints and Symptoms: No Complaints or Symptoms Medical History: Negative for: End Stage Renal Disease Past Medical History Notes: frequent UTIs Immunological Medical History: Negative for: Lupus Erythematosus; Raynaudos; Scleroderma Musculoskeletal Complaints and Symptoms: No Complaints or Symptoms Medical History: Positive for: Osteoarthritis Negative for: Gout; Rheumatoid Arthritis Neurologic Complaints and Symptoms: No Complaints or Symptoms Medical History: Negative for: Dementia; Neuropathy; Quadriplegia; Paraplegia; Seizure Disorder Oncologic Complaints and Symptoms: No Complaints or Symptoms Medical History: Positive for: Received Radiation Negative for: Received Chemotherapy Past Medical History Notes: Breast Ca- R mastectomy Psychiatric Complaints and Symptoms: No Complaints or Symptoms Medical History: Negative for: Anorexia/bulimia;  Confinement Anxiety HBO Extended History Items [name], KRUGGEL ([id number]) Eyes: Cataracts Immunizations Tetanus Vaccine: Last tetanus shot: 08/15/2008 Hospitalization / Surgery History Name of Hospital Purpose of Hospitalization/Surgery Date [facility] (R) knee replacement 09/16/2014 Family and Social History Cancer: Yes - Siblings; Heart Disease: Yes - Father; Hereditary Spherocytosis: No; Hypertension: Yes - Siblings; Kidney Disease: No; Lung Disease: No; Seizures: No; Stroke: Yes - Mother; Thyroid Problems: No; Tuberculosis: No; Never smoker; Marital Status - Married; Alcohol Use: Never; Drug Use: No History; Caffeine Use: Daily; Financial Concerns: No; Food, Clothing or Shelter Needs: No; Support System Lacking: No; Transportation Concerns: No; Advanced Directives: Yes (Not Provided); Patient does not want information on Advanced Directives; Do not resuscitate: No; Living Will: Yes (Not Provided); Medical Power of Attorney: Yes (Not Provided) Physician Affirmation I have reviewed and agree with the above information. Electronic Signature(s) Signed: 03/03/2015 4:30:30 PM By: [name] Signed: 03/04/2015 5:49:44 PM By: [name], [name] Entered By: Quay Burow,  IIIThayer Jew on 03/03/2015 [id number] DASHELL, RYON ([id number]) -------------------------------------------------------------------------------- SuperBill Details Patient Name: DOREN, LAMOTTE. Date of Service: 03/03/2015 Medical Record Patient Account Number: [id number] Number: Treating RN: [name] [birth year] (82 y.o. 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 I48.2  Chronic atrial fibrillation Facility Procedures CPT4 Code Description: [id number] - WOUND CARE VISIT-LEV 3 EST PT Modifier: Quantity: 1 CPT4 Code Description: [id number] - DEBRIDE WOUND 1ST 20 SQ CM OR < ICD-10 Description Diagnosis I70.233 Atherosclerosis of native arteries of right leg with u Modifier: lceration of Quantity: 1 ankle Physician Procedures CPT4 Code Description: [id number] - WC PHYS LEVEL 4 - NEW PT ICD-10 Description Diagnosis I70.233 Atherosclerosis of native arteries of right leg with u Modifier: lceration of a Quantity: 1 nkle CPT4 Code Description: [id number] - WC PHYS DEBR WO ANESTH 20 SQ CM ICD-10 Description Diagnosis I70.233 Atherosclerosis of native arteries of right leg with u Modifier: lceration of a Quantity: 1 nkle Electronic Signature(s) Signed: 03/03/2015 4:30:30 PM By: [name] Entered By: Latoya Grayer on 03/03/2015 13:05:54

## 2015-03-06 NOTE — Progress Notes (Signed)
PIETRINA, DUNLEVY ([id number]) Visit Report for 03/03/2015 Abuse/Suicide Risk Screen Details Patient Name: [name], [name]. Date of Service: 03/03/2015 9:45 AM Medical Record Patient Account Number: [id number] Number: Treating RN: Cornell Barman 11/10/1932 (82 y.o. Other Clinician: Date of Birth/Sex: Female) Treating BURNS III, Primary Care Physician/Extender: Langston Masker, Elta Gu[place] Physician: Referring Physician: Melina Modena in Treatment: 0 Abuse/Suicide Risk Screen Items Answer ABUSE/SUICIDE RISK SCREEN: Has anyone close to you tried to hurt or harm you recentlyo No Do you feel uncomfortable with anyone in your familyo No Has anyone forced you do things that you didnot want to doo No Do you have any thoughts of harming yourselfo No Patient displays signs or symptoms of abuse and/or neglect. No Electronic Signature(s) Signed: 03/04/2015 5:49:44 PM By: Gretta Cool, RN, BSN, Kim RN, BSN Entered By: Gretta Cool, RN, BSN, Kim on 03/03/2015 10:15:44 Cosman, [name] ([id number]) -------------------------------------------------------------------------------- Activities of Daily Living Details Patient Name: [name], [name] Date of Service: 03/03/2015 9:45 AM Medical Record Patient Account Number: [id number] Number: Treating RN: Cornell Barman Aug 12, 1932 (82 y.o. Other Clinician: Date of Birth/Sex: Female) Treating BURNS III, Primary Care Physician/Extender: Langston Masker, Elta Gu[place] Physician: Referring Physician: Melina Modena in Treatment: 0 Activities of Daily Living Items Answer Activities of Daily Living (Please select one for each item) Drive Automobile Completely Able Take Medications Completely Able Use Telephone Completely Able Care for Appearance Completely Able Use Toilet Completely Able Bath / Shower Completely Able Dress Self Completely Able Feed Self Completely Able Walk Completely Able Get In / Out Bed Completely Able Housework Completely Able Prepare Meals Completely  Able Handle Money Completely Able Shop for Self Completely Able Electronic Signature(s) Signed: 03/04/2015 5:49:44 PM By: Gretta Cool, RN, BSN, Kim RN, BSN Entered By: Gretta Cool, RN, BSN, Kim on 03/03/2015 10:15:55 Rosell, [name] ([id number]) -------------------------------------------------------------------------------- Education Assessment Details Patient Name: [name] Date of Service: 03/03/2015 9:45 AM Medical Record Patient Account Number: [id number] Number: Treating RN: Cornell Barman [birth year] (82 y.o. Other Clinician: Date of Birth/Sex: Female) Treating BURNS III, Primary Care Physician/Extender: Langston Masker, Elta [place] Physician: Referring Physician: Melina Modena in Treatment: 0 Primary Learner Assessed: Patient Learning Preferences/Education Level/Primary Language Learning Preference: Explanation Highest Education Level: College or Above Preferred Language: English Cognitive Barrier Assessment/Beliefs Language Barrier: No Translator Needed: No Memory Deficit: No Emotional Barrier: No Cultural/Religious Beliefs Affecting Medical No Care: Physical Barrier Assessment Impaired Vision: Yes Glasses Impaired Hearing: No Decreased Hand dexterity: No Knowledge/Comprehension Assessment Knowledge Level: High Comprehension Level: High Ability to understand written High instructions: Ability to understand verbal High instructions: Motivation Assessment Anxiety Level: Calm Cooperation: Cooperative Education Importance: Acknowledges Need Interest in Health Problems: Asks Questions Perception: Coherent Willingness to Engage in Self- High Management Activities: High EGAN, ZAMOR ([id number]) Readiness to Engage in Self- Management Activities: Electronic Signature(s) Signed: 03/04/2015 5:49:44 PM By: Gretta Cool, RN, BSN, Kim RN, BSN Entered By: Gretta Cool, RN, BSN, Kim on 03/03/2015 10:16:22 Carrithers, [name]  ([id number]) -------------------------------------------------------------------------------- Fall Risk Assessment Details Patient Name: [name]. Date of Service: 03/03/2015 9:45 AM Medical Record Patient Account Number: [id number] Number: Treating RN: Cornell Barman 03/27/1933 (82 y.o. Other Clinician: Date of Birth/Sex: Female) Treating BURNS III, Primary Care Physician/Extender: Langston Masker, Elta [place] Physician: Referring Physician: Melina Modena in Treatment: 0 Fall Risk Assessment Items FALL RISK ASSESSMENT: History of falling - immediate or within 3 months 25 Yes Secondary diagnosis 0 No Ambulatory aid None/bed rest/wheelchair/nurse 0 No Crutches/cane/walker 15 Yes Furniture 0 No IV Access/Saline Lock 0 No Gait/Training  Normal/bed rest/immobile 0 Yes Weak 0 No Impaired 0 No Mental Status Oriented to own ability 0 Yes Electronic Signature(s) Signed: 03/04/2015 5:49:44 PM By: Gretta Cool, RN, BSN, Kim RN, BSN Entered By: Gretta Cool, RN, BSN, Kim on 03/03/2015 10:16:33 Lovering, [name] ([id number]) -------------------------------------------------------------------------------- Foot Assessment Details Patient Name: [name]. Date of Service: 03/03/2015 9:45 AM Medical Record Patient Account Number: [id number] Number: Treating RN: Cornell Barman 07-13-1932 (82 y.o. Other Clinician: Date of Birth/Sex: Female) Treating BURNS III, Primary Care Physician/Extender: Langston Masker, Elta Gu[place] Physician: Referring Physician: Melina Modena in Treatment: 0 Foot Assessment Items Site Locations + = Sensation present, - = Sensation absent, C = Callus, U = Ulcer R = Redness, W = Warmth, M = Maceration, PU = Pre-ulcerative lesion F = Fissure, S = Swelling, D = Dryness Assessment Right: Left: Other Deformity: No No Prior Foot Ulcer: No No Prior Amputation: No No Charcot Joint: No No Ambulatory Status: Ambulatory With Help Assistance Device: Cane Gait:  Steady Electronic Signature(s) Signed: 03/04/2015 5:49:44 PM By: Gretta Cool, RN, BSN, Kim RN, BSN Petko, [name] ([id number]) Entered By: Gretta Cool, RN, BSN, Kim on 03/03/2015 10:17:06 Pistole, [name] ([id number]) -------------------------------------------------------------------------------- Nutrition Risk Assessment Details Patient Name: [name]. Date of Service: 03/03/2015 9:45 AM Medical Record Patient Account Number: [id number] Number: Treating RN: Cornell Barman 04-Mar-1933 (82 y.o. Other Clinician: Date of Birth/Sex: Female) Treating BURNS III, Primary Care Physician/Extender: Langston Masker, Elta [place] Physician: Referring Physician: Melina Modena in Treatment: 0 Height (in): 68 Weight (lbs): 149 Body Mass Index (BMI): 22.7 Nutrition Risk Assessment Items NUTRITION RISK SCREEN: I have an illness or condition that made me change the kind and/or 0 No amount of food I eat I eat fewer than two meals per day 0 No I eat few fruits and vegetables, or milk products 0 No I have three or more drinks of beer, liquor or wine almost every day 0 No I have tooth or mouth problems that make it hard for me to eat 0 No I don't always have enough money to buy the food I need 0 No I eat alone most of the time 0 No I take three or more different prescribed or over-the-counter drugs a 0 No day Without wanting to, I have lost or gained 10 pounds in the last six 0 No months I am not always physically able to shop, cook and/or feed myself 0 No Nutrition Protocols Good Risk Protocol 0 No interventions needed Moderate Risk Protocol Electronic Signature(s) Signed: 03/04/2015 5:49:44 PM By: Gretta Cool, RN, BSN, Kim RN, BSN Entered By: Gretta Cool, RN, BSN, Kim on 03/03/2015 10:16:39

## 2015-03-09 ENCOUNTER — Other Ambulatory Visit: Payer: Self-pay | Admitting: Surgery

## 2015-03-09 ENCOUNTER — Ambulatory Visit
Admission: RE | Admit: 2015-03-09 | Discharge: 2015-03-09 | Disposition: A | Discharge status: Home / Self Care | Payer: Medicare PPO | Source: Ambulatory Visit | Attending: Surgery | Admitting: Surgery

## 2015-03-09 DIAGNOSIS — M85861 Other specified disorders of bone density and structure, right lower leg: Secondary | ICD-10-CM | POA: Insufficient documentation

## 2015-03-09 DIAGNOSIS — M7989 Other specified soft tissue disorders: Secondary | ICD-10-CM | POA: Insufficient documentation

## 2015-03-09 DIAGNOSIS — S81801A Unspecified open wound, right lower leg, initial encounter: Secondary | ICD-10-CM

## 2015-03-09 DIAGNOSIS — R609 Edema, unspecified: Secondary | ICD-10-CM

## 2015-03-09 DIAGNOSIS — S91001A Unspecified open wound, right ankle, initial encounter: Secondary | ICD-10-CM | POA: Diagnosis present

## 2015-03-09 DIAGNOSIS — R52 Pain, unspecified: Secondary | ICD-10-CM

## 2015-03-09 DIAGNOSIS — B999 Unspecified infectious disease: Secondary | ICD-10-CM

## 2015-03-10 ENCOUNTER — Encounter: Payer: Medicare PPO | Admitting: Surgery

## 2015-03-10 DIAGNOSIS — I70233 Atherosclerosis of native arteries of right leg with ulceration of ankle: Secondary | ICD-10-CM | POA: Diagnosis not present

## 2015-03-11 NOTE — Progress Notes (Signed)
ARMENTA, DOW ([id number]) Visit Report for 03/10/2015 Fall Risk Assessment Details Patient Name: [name], [name]. Date of Service: 03/10/2015 9:15 AM Medical Record Patient Account Number: [id number] Number: Treating RN: Ahmed Prima Sep 11, 1932 (82 y.o. Other Clinician: Date of Birth/Sex: Female) Treating BURNS III, Primary Care Physician/Extender: Langston Masker, Elta [place] Physician: Referring Physician: Melina Modena in Treatment: 1 Fall Risk Assessment Items Have you had 2 or more falls in the last 12 monthso 0 Yes Have you had any fall that resulted in injury in the last 12 monthso 0 No FALL RISK ASSESSMENT: History of falling - immediate or within 3 months 25 Yes Secondary diagnosis 15 Yes Ambulatory aid None/bed rest/wheelchair/nurse 0 No Crutches/cane/walker 15 Yes Furniture 0 No IV Access/Saline Lock 0 No Gait/Training Normal/bed rest/immobile 0 No Weak 10 Yes Impaired 0 No Mental Status Oriented to own ability 0 Yes Electronic Signature(s) Signed: 03/10/2015 4:01:39 PM By: Alric Quan Entered By: Alric Quan on 03/10/2015 [place]

## 2015-03-11 NOTE — Progress Notes (Signed)
SHIASIA, CORRENTI ([id number]) Visit Report for 03/10/2015 Arrival Information Details Patient Name: [name], [name]. Date of Service: 03/10/2015 9:15 AM Medical Record Patient Account Number: [id number] Number: Treating RN: Ahmed Prima [birth year] (82 y.o. Other Clinician: Date of Birth/Sex: Female) Treating BURNS III, Primary Care Physician: Emily Filbert Physician/Extender: Thayer Jew Referring Physician: Melina Modena in Treatment: 1 Visit Information History Since Last Visit Added or deleted any medications: No Patient Arrived: Cane Any new allergies or adverse reactions: No Arrival Time: 09:45 Had a fall or experienced change in Yes Accompanied By: self activities of daily living that may affect Transfer Assistance: None risk of falls: Patient Identification Verified: Yes Signs or symptoms of abuse/neglect since last No Secondary Verification Process Yes visito Completed: Hospitalized since last visit: No Patient Has Alerts: Yes Has Dressing in Place as Prescribed: Yes Patient Alerts: Patient on Blood Pain Present Now: No Thinner 81mg  aspirin ABI 08/2014 (R) 0.68 (L) 0.85 Electronic Signature(s) Signed: 03/10/2015 4:01:39 PM By: Alric Quan Entered By: Alric Quan on 03/10/2015 09:45:39 Blumenberg, [name] ([id number]) -------------------------------------------------------------------------------- Clinic Level of Care Assessment Details Patient Name: [name]. Date of Service: 03/10/2015 9:15 AM Medical Record Patient Account Number: [id number] Number: Treating RN: Cornell Barman 04-27-1932 (82 y.o. Other Clinician: Date of Birth/Sex: Female) Treating BURNS III, Primary Care Physician: Emily Filbert Physician/Extender: Thayer Jew Referring Physician: Melina Modena in Treatment: 1 Clinic Level of Care Assessment Items TOOL 4 Quantity Score []  - Use when only an EandM is performed on FOLLOW-UP visit 0 ASSESSMENTS - Nursing Assessment /  Reassessment []  - Reassessment of Co-morbidities (includes updates in patient status) 0 X - Reassessment of Adherence to Treatment Plan 1 5 ASSESSMENTS - Wound and Skin Assessment / Reassessment X - Simple Wound Assessment / Reassessment - one wound 1 5 []  - Complex Wound Assessment / Reassessment - multiple wounds 0 []  - Dermatologic / Skin Assessment (not related to wound area) 0 ASSESSMENTS - Focused Assessment []  - Circumferential Edema Measurements - multi extremities 0 []  - Nutritional Assessment / Counseling / Intervention 0 []  - Lower Extremity Assessment (monofilament, tuning fork, pulses) 0 []  - Peripheral Arterial Disease Assessment (using hand held doppler) 0 ASSESSMENTS - Ostomy and/or Continence Assessment and Care []  - Incontinence Assessment and Management 0 []  - Ostomy Care Assessment and Management (repouching, etc.) 0 PROCESS - Coordination of Care X - Simple Patient / Family Education for ongoing care 1 15 []  - Complex (extensive) Patient / Family Education for ongoing care 0 X - Staff obtains Programmer, systems, Records, Test Results / Process Orders 1 10 []  - Staff telephones HHA, Nursing Homes / Clarify orders / etc 0 [name], Latoya A. ([id number]) []  - Routine Transfer to another Facility (non-emergent condition) 0 []  - Routine Hospital Admission (non-emergent condition) 0 []  - New Admissions / Biomedical engineer / Ordering NPWT, Apligraf, etc. 0 []  - Emergency Hospital Admission (emergent condition) 0 X - Simple Discharge Coordination 1 10 []  - Complex (extensive) Discharge Coordination 0 PROCESS - Special Needs []  - Pediatric / Minor Patient Management 0 []  - Isolation Patient Management 0 []  - Hearing / Language / Visual special needs 0 []  - Assessment of Community assistance (transportation, D/C planning, etc.) 0 []  - Additional assistance / Altered mentation 0 []  - Support Surface(s) Assessment (bed, cushion, seat, etc.) 0 INTERVENTIONS - Wound Cleansing /  Measurement X - Simple Wound Cleansing - one wound 1 5 []  - Complex Wound Cleansing - multiple wounds 0 X - Wound Imaging (  photographs - any number of wounds) 1 5 []  - Wound Tracing (instead of photographs) 0 X - Simple Wound Measurement - one wound 1 5 []  - Complex Wound Measurement - multiple wounds 0 INTERVENTIONS - Wound Dressings X - Small Wound Dressing one or multiple wounds 1 10 []  - Medium Wound Dressing one or multiple wounds 0 []  - Large Wound Dressing one or multiple wounds 0 []  - Application of Medications - topical 0 []  - Application of Medications - injection 0 [name], Latoya A. ([id number]) INTERVENTIONS - Miscellaneous []  - External ear exam 0 []  - Specimen Collection (cultures, biopsies, blood, body fluids, etc.) 0 []  - Specimen(s) / Culture(s) sent or taken to Lab for analysis 0 []  - Patient Transfer (multiple staff / Harrel Lemon Lift / Similar devices) 0 []  - Simple Staple / Suture removal (25 or less) 0 []  - Complex Staple / Suture removal (26 or more) 0 []  - Hypo / Hyperglycemic Management (close monitor of Blood Glucose) 0 []  - Ankle / Brachial Index (ABI) - do not check if billed separately 0 X - Vital Signs 1 5 Has the patient been seen at the hospital within the last three years: Yes Total Score: 75 Level Of Care: New/Established - Level 2 Electronic Signature(s) Signed: 03/10/2015 3:40:20 PM By: Gretta Cool, RN, BSN, Kim RN, BSN Entered By: Gretta Cool, RN, BSN, Kim on 03/10/2015 10:10:53 [name], [name] ([id number]) -------------------------------------------------------------------------------- Encounter Discharge Information Details Patient Name: [name]. Date of Service: 03/10/2015 9:15 AM Medical Record Patient Account Number: [id number] Number: Treating RN: Cornell Barman 05-Sep-1932 (82 y.o. Other Clinician: Date of Birth/Sex: Female) Treating BURNS III, Primary Care Physician: Emily Filbert Physician/Extender: Thayer Jew Referring Physician: Melina Modena in  Treatment: 1 Encounter Discharge Information Items Discharge Pain Level: 0 Discharge Condition: Stable Ambulatory Status: Cane Discharge Destination: Home Transportation: Private Auto Accompanied By: self Schedule Follow-up Appointment: Yes Medication Reconciliation completed and provided to Patient/Care Yes Neal Oshea: Provided on Clinical Summary of Care: 03/10/2015 Form Type Recipient Paper Patient JW Electronic Signature(s) Signed: 03/10/2015 10:13:46 AM By: Ruthine Dose Entered By: Ruthine Dose on 03/10/2015 10:13:45 [name], [name] ([id number]) -------------------------------------------------------------------------------- Lower Extremity Assessment Details Patient Name: [name]. Date of Service: 03/10/2015 9:15 AM Medical Record Patient Account Number: [id number] Number: Treating RN: Cornell Barman 1933/01/29 (82 y.o. Other Clinician: Date of Birth/Sex: Female) Treating BURNS III, Primary Care Physician: Emily Filbert Physician/Extender: Thayer Jew Referring Physician: Melina Modena in Treatment: 1 Vascular Assessment Pulses: Posterior Tibial Dorsalis Pedis Palpable: [Right:Yes] Extremity colors, hair growth, and conditions: Extremity Color: [Right:Mottled] Hair Growth on Extremity: [Right:No] Temperature of Extremity: [Right:Cool] Capillary Refill: [Right:< 3 seconds] Toe Nail Assessment Left: Right: Thick: No Discolored: No Deformed: No Electronic Signature(s) Signed: 03/10/2015 3:40:20 PM By: Gretta Cool, RN, BSN, Kim RN, BSN Entered By: Gretta Cool, RN, BSN, Kim on 03/10/2015 09:49:35 Shively, [name] ([id number]) -------------------------------------------------------------------------------- Multi Wound Chart Details Patient Name: [name]. Date of Service: 03/10/2015 9:15 AM Medical Record Patient Account Number: [id number] Number: Treating RN: Cornell Barman 1932-10-25 (82 y.o. Other Clinician: Date of Birth/Sex: Female) Treating BURNS  III, Primary Care Physician: Emily Filbert Physician/Extender: Thayer Jew Referring Physician: Melina Modena in Treatment: 1 Vital Signs Height(in): 68 Pulse(bpm): 56 Weight(lbs): 149 Blood Pressure 153/54 (mmHg): Body Mass Index(BMI): 23 Temperature(F): 98.0 Respiratory Rate 18 (breaths/min): Photos: [9:No Photos] [N/A:N/A] Wound Location: [9:Malleolus - Lateral] [N/A:N/A] Wounding Event: [9:Gradually Appeared] [N/A:N/A] Primary Etiology: [9:Arterial Insufficiency Ulcer N/A] Comorbid History: [9:Cataracts, Anemia, Arrhythmia, Hypertension, Osteoarthritis, Received Radiation] [N/A:N/A] Date  Acquired: [9:01/25/2015] [N/A:N/A] Weeks of Treatment: [9:1] [N/A:N/A] Wound Status: [9:Open] [N/A:N/A] Measurements L x W x D 0.4x0.3x0.1 [N/A:N/A] (cm) Area (cm) : [9:0.094] [N/A:N/A] Volume (cm) : [9:0.009] [N/A:N/A] % Reduction in Area: [9:-100.00%] [N/A:N/A] % Reduction in Volume: -80.00% [N/A:N/A] Classification: [9:Full Thickness Without Exposed Support Structures] [N/A:N/A] Exudate Amount: [9:None Present] [N/A:N/A] Wound Margin: [9:Flat and Intact] [N/A:N/A] Granulation Amount: [9:None Present (0%)] [N/A:N/A] Necrotic Amount: [9:Large (67-100%)] [N/A:N/A] Necrotic Tissue: [9:Eschar] [N/A:N/A] Exposed Structures: [9:Fascia: No Fat: No Tendon: No] [N/A:N/A] Muscle: No Joint: No Bone: No Limited to Skin Breakdown Epithelialization: None N/A N/A Periwound Skin Texture: Edema: No N/A N/A Excoriation: No Induration: No Callus: No Crepitus: No Fluctuance: No Friable: No Rash: No Scarring: No Periwound Skin Dry/Scaly: Yes N/A N/A Moisture: Maceration: No Moist: No Periwound Skin Color: Atrophie Blanche: No N/A N/A Cyanosis: No Ecchymosis: No Erythema: No Hemosiderin Staining: No Mottled: No Pallor: No Rubor: No Tenderness on No N/A N/A Palpation: Wound Preparation: Ulcer Cleansing: N/A N/A Rinsed/Irrigated with Saline Topical Anesthetic Applied: Other:  lidociane 4% Treatment Notes Electronic Signature(s) Signed: 03/10/2015 3:40:20 PM By: Gretta Cool, RN, BSN, Kim RN, BSN Entered By: Gretta Cool, RN, BSN, Kim on 03/10/2015 09:53:33 [name], [name] ([id number]) -------------------------------------------------------------------------------- [place] Details Patient Name: [name]. Date of Service: 03/10/2015 9:15 AM Medical Record Patient Account Number: [id number] Number: Treating RN: Cornell Barman 1933/01/31 (82 y.o. Other Clinician: Date of Birth/Sex: Female) Treating BURNS III, Primary Care Physician: Emily Filbert Physician/Extender: Thayer Jew Referring Physician: Melina Modena in Treatment: 1 Active Inactive Abuse / Safety / Falls / Self Care Management Nursing Diagnoses: Potential for falls Self care deficit: actual or potential Goals: Patient will remain injury free Date Initiated: 03/03/2015 Goal Status: Active Patient/caregiver will verbalize/demonstrate measure taken to improve self care Date Initiated: 03/10/2015 Goal Status: Active Interventions: Assess fall risk on admission and as needed Provide education on fall prevention Notes: Orientation to the Wound Care Program Nursing Diagnoses: Knowledge deficit related to the wound healing center program Goals: Patient/caregiver will verbalize understanding of the [facility] Date Initiated: 03/03/2015 Goal Status: Active Interventions: Provide education on orientation to the wound center Notes: [name], [name] ([id number]) Pain, Acute or Chronic Nursing Diagnoses: Pain, acute or chronic: actual or potential Goals: Patient will verbalize adequate pain control and receive pain control interventions during procedures as needed Date Initiated: 03/03/2015 Goal Status: Active Interventions: Complete pain assessment as per visit requirements Reposition patient for comfort Notes: Tissue Oxygenation Nursing Diagnoses: Actual  ineffective tissue perfusion; peripheral (select once diagnosis is confirmed) Goals: Revascularization procedures completed as ordered Date Initiated: 03/03/2015 Goal Status: Active Interventions: Assess patient understanding of disease process and management upon diagnosis and as needed Treatment Activities: Test ordered outside of clinic : 03/10/2015 Notes: Wound/Skin Impairment Nursing Diagnoses: Impaired tissue integrity Goals: Patient/caregiver will verbalize understanding of skin care regimen Date Initiated: 03/03/2015 Goal Status: Active Ulcer/skin breakdown will heal within 14 weeks Date Initiated: 03/03/2015 Goal Status: Active [name], [name] ([id number]) Interventions: Assess ulceration(s) every visit Notes: Electronic Signature(s) Signed: 03/10/2015 3:40:20 PM By: Gretta Cool, RN, BSN, Kim RN, BSN Entered By: Gretta Cool, RN, BSN, Kim on 03/10/2015 09:53:26 [name], [name] ([id number]) -------------------------------------------------------------------------------- Pain Assessment Details Patient Name: [name]. Date of Service: 03/10/2015 9:15 AM Medical Record Patient Account Number: [id number] Number: Treating RN: Ahmed Prima October 20, 1932 (82 y.o. Other Clinician: Date of Birth/Sex: Female) Treating BURNS III, Primary Care Physician: Emily Filbert Physician/Extender: Thayer Jew Referring Physician: Melina Modena in Treatment: 1 Active Problems  Location of Pain Severity and Description of Pain Patient Has Paino No Site Locations Pain Management and Medication Current Pain Management: Electronic Signature(s) Signed: 03/10/2015 4:01:39 PM By: Alric Quan Entered By: Alric Quan on 03/10/2015 09:45:48 [name], [name] ([id number]) -------------------------------------------------------------------------------- Patient/Caregiver Education Details Patient Name: [name]. Date of Service: 03/10/2015 9:15 AM Medical Record Patient Account Number:  [id number] Number: Treating RN: Cornell Barman Apr 19, 1932 (82 y.o. Other Clinician: Date of Birth/Gender: Female) Treating BURNS III, Primary Care Physician: Emily Filbert Physician/Extender: Thayer Jew Referring Physician: Melina Modena in Treatment: 1 Education Assessment Education Provided To: Patient Education Topics Provided Wound/Skin Impairment: Handouts: Caring for Your Ulcer, Other: wound care as prescribed Methods: Demonstration, Explain/Verbal Responses: State content correctly Electronic Signature(s) Signed: 03/10/2015 3:40:20 PM By: Gretta Cool, RN, BSN, Kim RN, BSN Entered By: Gretta Cool, RN, BSN, Kim on 03/10/2015 10:13:43 [name], [name] ([id number]) -------------------------------------------------------------------------------- Wound Assessment Details Patient Name: [name]. Date of Service: 03/10/2015 9:15 AM Medical Record Patient Account Number: [id number] Number: Treating RN: Cornell Barman 1932-11-12 (82 y.o. Other Clinician: Date of Birth/Sex: Female) Treating BURNS III, Primary Care Physician: Emily Filbert Physician/Extender: Thayer Jew Referring Physician: Melina Modena in Treatment: 1 Wound Status Wound Number: 9 Primary Arterial Insufficiency Ulcer Etiology: Wound Location: Malleolus - Lateral Wound Open Wounding Event: Gradually Appeared Status: Date Acquired: 01/25/2015 Comorbid Cataracts, Anemia, Arrhythmia, Weeks Of Treatment: 1 History: Hypertension, Osteoarthritis, Received Clustered Wound: No Radiation Wound Measurements Length: (cm) 0.4 Width: (cm) 0.3 Depth: (cm) 0.1 Area: (cm) 0.094 Volume: (cm) 0.009 % Reduction in Area: -100% % Reduction in Volume: -80% Epithelialization: None Tunneling: No Undermining: No Wound Description Full Thickness Without Exposed Classification: Support Structures Wound Margin: Flat and Intact Exudate None Present Amount: Wound Bed Granulation Amount: None Present (0%) Exposed  Structure Necrotic Amount: Large (67-100%) Fascia Exposed: No Necrotic Quality: Eschar Fat Layer Exposed: No Tendon Exposed: No Muscle Exposed: No Joint Exposed: No Bone Exposed: No Limited to Skin Breakdown Periwound Skin Texture Texture Color No Abnormalities Noted: No No Abnormalities Noted: No Callus: No Atrophie Blanche: No Crepitus: No Cyanosis: No Prasad, Marilynne A. ([id number]) Excoriation: No Ecchymosis: No Fluctuance: No Erythema: No Friable: No Hemosiderin Staining: No Induration: No Mottled: No Localized Edema: No Pallor: No Rash: No Rubor: No Scarring: No Moisture No Abnormalities Noted: No Dry / Scaly: Yes Maceration: No Moist: No Wound Preparation Ulcer Cleansing: Rinsed/Irrigated with Saline Topical Anesthetic Applied: Other: lidociane 4%, Treatment Notes Wound #9 (Lateral Malleolus) 1. Cleansed with: Clean wound with Normal Saline 2. Anesthetic Topical Lidocaine 4% cream to wound bed prior to debridement 4. Dressing Applied: Other dressing (specify in notes) 5. Secondary Dressing Applied Bordered Foam Dressing Notes Mupiricin cream Electronic Signature(s) Signed: 03/10/2015 3:40:20 PM By: Gretta Cool, RN, BSN, Kim RN, BSN Entered By: Gretta Cool, RN, BSN, Kim on 03/10/2015 09:50:59 Rodin, [name] ([id number]) -------------------------------------------------------------------------------- Vitals Details Patient Name: [name] Date of Service: 03/10/2015 9:15 AM Medical Record Patient Account Number: [id number] Number: Treating RN: Ahmed Prima 1932/04/27 (82 y.o. Other Clinician: Date of Birth/Sex: Female) Treating BURNS III, Primary Care Physician: Emily Filbert Physician/Extender: Thayer Jew Referring Physician: Melina Modena in Treatment: 1 Vital Signs Time Taken: 09:45 Temperature (F): 98.0 Height (in): 68 Pulse (bpm): 56 Weight (lbs): 149 Respiratory Rate (breaths/min): 18 Body Mass Index (BMI): 22.7 Blood Pressure (mmHg):  153/54 Reference Range: 80 - 120 mg / dl Electronic Signature(s) Signed: 03/10/2015 4:01:39 PM By: Alric Quan Entered By: Alric Quan on 03/10/2015 09:46:04

## 2015-03-11 NOTE — Progress Notes (Signed)
KELTY, ASTI ([id number]) Visit Report for 03/10/2015 Chief Complaint Document Details Patient Name: [name], [name]. Date of Service: 03/10/2015 9:15 AM Medical Record Patient Account Number: [id number] Number: Treating RN: Cornell Barman [birth year] (81 y.o. Other Clinician: Date of Birth/Sex: Female) Treating BURNS III, Primary Care Physician/Extender: Langston Masker, Elta Gu[place] Physician: Referring Physician: Melina Modena in Treatment: 1 Information Obtained from: Patient Chief Complaint Chronic right ankle ulceration. Electronic Signature(s) Signed: 03/10/2015 3:24:44 PM By: Loletha Grayer MD Entered By: Loletha Grayer on 03/10/2015 12:07:34 Koch, [name] ([id number]) -------------------------------------------------------------------------------- HPI Details Patient Name: [name] Date of Service: 03/10/2015 9:15 AM Medical Record Patient Account Number: [id number] Number: Treating RN: Cornell Barman 04/20/1932 (81 y.o. Other Clinician: Date of Birth/Sex: Female) Treating BURNS III, Primary Care Physician/Extender: Langston Masker, Elta [place] Physician: Referring Physician: Melina Modena in Treatment: 1 History of Present Illness HPI Description: Pleasant 81 year old with history of atrial fibrillation and peripheral vascular disease. She has had a chronic recurrent ulceration at her right lateral malleolus since 2014. Previously healed with Grafix application. Seen by her cardiologist Dr. Kathlyn [place] in April 2016. Noninvasive vascular evaluation showed an ABI of 0.68 on the right and 0.85 on the left. Angiography in May 2016 which showed moderate nonobstructive disease affecting the distal right SFA and popliteal artery. One-vessel runoff below the knee via the peroneal artery with short occlusion of the anterior tibial artery and reconstitution. She developed a recurrent ulceration at her right lateral malleolus in October 2016. She reports moderate  to severe pain. Possible rest pain. X-ray negative for osteomyelitis. Ambulating per her baseline. No significant claudication. No fever or chills. Minimal drainage. o Electronic Signature(s) Signed: 03/10/2015 3:24:44 PM By: Loletha Grayer MD Entered By: Loletha Grayer on 03/10/2015 12:07:57 [place]goats, [name] ([id number]) -------------------------------------------------------------------------------- Physical Exam Details Patient Name: [name] Date of Service: 03/10/2015 9:15 AM Medical Record Patient Account Number: [id number] Number: Treating RN: Cornell Barman Jun 14, 1932 (81 y.o. Other Clinician: Date of Birth/Sex: Female) Treating BURNS III, Primary Care Physician/Extender: Langston Masker, Elta Gu[place] Physician: Referring Physician: Melina Modena in Treatment: 1 Constitutional . Pulse regular. Respirations normal and unlabored. Afebrile. Marland Kitchen Respiratory WNL. No retractions.. Cardiovascular . Integumentary (Hair, Skin) .Marland Kitchen Neurological . Psychiatric Judgement and insight Intact.. Oriented times 3.. No evidence of depression, anxiety, or agitation.. Notes Right lateral malleolus ulceration. Full-thickness. No exposed deep structures. No probe to bone. Minimal surrounding erythema. No significant edema. Multiple varicosities. No palpable pedal pulses. Dopplerable DP. Right ABI 0.68. Electronic Signature(s) Signed: 03/10/2015 3:24:44 PM By: Loletha Grayer MD Entered By: Loletha Grayer on 03/10/2015 12:08:33 Hamid, [name] ([id number]) -------------------------------------------------------------------------------- Physician Orders Details Patient Name: [name] Date of Service: 03/10/2015 9:15 AM Medical Record Patient Account Number: [id number] Number: Treating RN: Cornell Barman [birth year] (81 y.o. Other Clinician: Date of Birth/Sex: Female) Treating BURNS III, Primary Care Physician/Extender: Langston Masker, Elta [place] Physician: Referring  Physician: Melina Modena in Treatment: 1 Verbal / Phone Orders: Yes Clinician: Cornell Barman Read Back and Verified: Yes Diagnosis Coding Wound Cleansing Wound #9 Lateral Malleolus o Clean wound with Normal Saline. Anesthetic Wound #9 Lateral Malleolus o Topical Lidocaine 4% cream applied to wound bed prior to debridement Primary Wound Dressing Wound #9 Lateral Malleolus o Other: - Mupiricin Cream Secondary Dressing Wound #9 Lateral Malleolus o Boardered Foam Dressing Dressing Change Frequency Wound #9 Lateral Malleolus o Change dressing every day. Follow-up Appointments Wound #9 Lateral Malleolus o Return Appointment in 1 week.  Edema Control Wound #9 Lateral Malleolus o Patient to wear own compression stockings o Elevate legs to the level of the heart and pump ankles as often as possible Medications-please add to medication list. Wound #9 Lateral Malleolus o P.O. Antibiotics - Cindamycin CHRISTINEJOY, CAMMARANO ([id number]) o Topical Antibiotic - Muiricin cream Patient Medications Allergies: Lipitor, Penicillins, doxycycline, Vioxx, Cipro, amlodipine, Macrobid Notifications Medication Indication Start End clindamycin HCl R ankle 03/10/2015 cellulitis DOSE oral 300 mg capsule - 300mg  po tid x 1 week mupirocin ulcer 03/10/2015 DOSE topical 2 % ointment - ointment topical daily to R ankle ulcer x 2 weeks Electronic Signature(s) Signed: 03/10/2015 12:13:40 PM By: Loletha Grayer MD Previous Signature: 03/10/2015 12:10:05 PM Version By: Loletha Grayer MD Entered By: Loletha Grayer on 03/10/2015 12:13:40 Alridge, [name] ([id number]) -------------------------------------------------------------------------------- Problem List Details Patient Name: [name]. Date of Service: 03/10/2015 9:15 AM Medical Record Patient Account Number: [id number] Number: Treating RN: Cornell Barman 1932/09/24 (81 y.o. 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 Other Clinician: Date of Birth/Sex: Female) Treating BURNS III, Primary Care Physician/Extender: Langston Masker, Elta [place] Physician: Referring Physician: Melina Modena in Treatment: 1 Subjective Chief Complaint Information obtained from Patient Chronic right ankle ulceration. History of Present Illness (HPI) Pleasant 81 year old with history of atrial fibrillation and peripheral vascular disease. She has had a chronic recurrent ulceration at her right lateral malleolus since 2014. Previously healed with Grafix application. Seen by her cardiologist Dr. Kathlyn [place] in April 2016. Noninvasive vascular evaluation showed an ABI of 0.68 on the right and 0.85 on the left. Angiography in May 2016 which showed moderate nonobstructive disease affecting the distal right SFA and popliteal artery. One-vessel runoff below the knee via the peroneal artery with short occlusion of the anterior tibial artery and reconstitution. She developed a recurrent ulceration at her right lateral  malleolus in October 2016. She reports moderate to severe pain. Possible rest pain. X-ray negative for osteomyelitis. Ambulating per her baseline. No significant claudication. No fever or chills. Minimal drainage. Objective Constitutional Pulse regular. Respirations normal and unlabored. Afebrile. Vitals Time Taken: 9:45 AM, Height: 68 in, Weight: 149 lbs, BMI: 22.7, Temperature: 98.0 F, Pulse: 56 bpm, Respiratory Rate: 18 breaths/min, Blood Pressure: 153/54 mmHg. QUINNLAN, MOUSSA ([id number]) Respiratory WNL. No retractions.Marland Kitchen Psychiatric Judgement and insight Intact.. Oriented times 3.. No evidence of depression, anxiety, or agitation.. General Notes: Right lateral malleolus ulceration. Full-thickness. No exposed deep structures. No probe to bone. Minimal surrounding erythema. No significant edema. Multiple varicosities. No palpable pedal pulses. Dopplerable DP. Right ABI 0.68. Integumentary (Hair, Skin) Wound #9 status is Open. Original cause of wound was Gradually Appeared. The wound is located on the Lateral Malleolus. The wound measures 0.4cm length x 0.3cm width x 0.1cm depth; 0.094cm^2 area and 0.009cm^3 volume. The wound is limited to  skin breakdown. There is no tunneling or undermining noted. There is a none present amount of drainage noted. The wound margin is flat and intact. There is no granulation within the wound bed. There is a large (67-100%) amount of necrotic tissue within the wound bed including Eschar. The periwound skin appearance exhibited: Dry/Scaly. The periwound skin appearance did not exhibit: Callus, Crepitus, Excoriation, Fluctuance, Friable, Induration, Localized Edema, Rash, Scarring, Maceration, Moist, Atrophie Blanche, Cyanosis, Ecchymosis, Hemosiderin Staining, Mottled, Pallor, Rubor, Erythema. 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 Mild cellulitis. Plan Wound Cleansing: Wound #9 Lateral Malleolus: Pederson, Kalyssa A. ([id number]) Clean wound with Normal Saline. Anesthetic: Wound #9 Lateral Malleolus: Topical Lidocaine 4% cream applied to wound bed prior to debridement Primary Wound Dressing: Wound #9 Lateral Malleolus: Other: - Mupiricin Cream Secondary Dressing: Wound #9 Lateral Malleolus: Boardered Foam Dressing Dressing Change Frequency: Wound #9 Lateral Malleolus: Change dressing every day. Follow-up Appointments: Wound #9 Lateral Malleolus: Return Appointment in 1 week. Edema Control: Wound #9 Lateral Malleolus: Patient to wear own compression stockings Elevate legs to the level of the heart and pump ankles as often as possible Medications-please add to medication list.: Wound #9 Lateral Malleolus: P.O. Antibiotics - Cindamycin Topical Antibiotic - Muiricin cream The following medication(s) was prescribed: clindamycin HCl oral 300 mg capsule 300mg  po tid x 1 week for R ankle cellulitis starting 03/10/2015 Mupirocin cream. Clindamycin o1 week. Compression stockings. If no significant improvement will recommend light compression bandage. Electronic Signature(s) Signed: 03/10/2015 3:24:44 PM By: Loletha Grayer MD Entered By: Loletha Grayer on 03/10/2015 12:11:07 Decou, [name] ([id number]) -------------------------------------------------------------------------------- SuperBill Details Patient Name: [name] Date of Service: 03/10/2015 Medical Record Patient Account Number: [id number] Number: Treating RN: Cornell Barman 17-Jul-1932 (82 y.o. Other Clinician: Date of Birth/Sex: Female) Treating BURNS III, Primary Care Physician/Extender: Langston Masker, Elta [place] Physician: Suella Grove in Treatment: 1 Referring Physician: Emily Filbert Diagnosis Coding ICD-10 Codes Code Description [phone] Atherosclerosis of  native arteries of right leg with ulceration of ankle L97.312 Non-pressure chronic ulcer of right ankle with fat layer exposed I48.2 Chronic atrial fibrillation L03.115 Cellulitis of right lower limb Facility Procedures CPT4 Code: [id number] Description: [phone] - WOUND CARE VISIT-LEV 2 EST PT Modifier: Quantity: 1 Physician Procedures CPT4 Code: [id number] Description: [id number] - WC PHYS LEVEL 3 - EST PT ICD-10 Description Diagnosis L03.115 Cellulitis of right lower limb Modifier: Quantity: 1 Electronic Signature(s) Signed: 03/10/2015 3:24:44 PM By: Loletha Grayer MD Entered By: Loletha Grayer on 03/10/2015 12:11:43

## 2015-03-17 ENCOUNTER — Encounter: Payer: Medicare PPO | Admitting: Surgery

## 2015-03-17 DIAGNOSIS — I70233 Atherosclerosis of native arteries of right leg with ulceration of ankle: Secondary | ICD-10-CM | POA: Diagnosis not present

## 2015-03-18 NOTE — Progress Notes (Signed)
[name] ([id number]) Visit Report for 03/17/2015 Arrival Information Details Patient Name: [name], [name]. Date of Service: 03/17/2015 2:00 PM Medical Record Patient Account Number: [id number] Number: Treating RN: [name] 20-Sep-1932 (82 y.o. Other Clinician: Date of Birth/Sex: Female) Treating [name] Physician/Extender: [name] Referring Physician: Melina Bailey in Treatment: 2 Visit Information History Since Last Visit Added or deleted any medications: No Patient Arrived: Cane Any new allergies or adverse reactions: No Arrival Time: 13:57 Had a fall or experienced change in No Accompanied By: self activities of daily living that may affect Transfer Assistance: None risk of falls: Patient Identification Verified: Yes Signs or symptoms of abuse/neglect since last No Secondary Verification Process Yes visito Completed: Hospitalized since last visit: No Patient Has Alerts: Yes Pain Present Now: No Patient Alerts: Patient on Blood Thinner 81mg  aspirin ABI 08/2014 (R) 0.68 (L) 0.85 Electronic Signature(s) Signed: 03/17/2015 5:54:02 PM By: [name] Entered By: [name] on 03/17/2015 13:57:40 [name] ([id number]) -------------------------------------------------------------------------------- Clinic Level of Care Assessment Details Patient Name: [name]. Date of Service: 03/17/2015 2:00 PM Medical Record Patient Account Number: [id number] Number: Treating RN: [name] [birth year] (82 y.o. Other Clinician: Date of Birth/Sex: Female) Treating [name] Physician/Extender: [name] Referring Physician: Melina Bailey in Treatment: 2 Clinic Level of Care Assessment Items TOOL 4 Quantity Score []  - Use when only an EandM is performed on FOLLOW-UP visit 0 ASSESSMENTS - Nursing Assessment / Reassessment X - Reassessment of Co-morbidities  (includes updates in patient status) 1 10 X - Reassessment of Adherence to Treatment Plan 1 5 ASSESSMENTS - Wound and Skin Assessment / Reassessment X - Simple Wound Assessment / Reassessment - one wound 1 5 []  - Complex Wound Assessment / Reassessment - multiple wounds 0 []  - Dermatologic / Skin Assessment (not related to wound area) 0 ASSESSMENTS - Focused Assessment []  - Circumferential Edema Measurements - multi extremities 0 []  - Nutritional Assessment / Counseling / Intervention 0 X - Lower Extremity Assessment (monofilament, tuning fork, pulses) 1 5 []  - Peripheral Arterial Disease Assessment (using hand held doppler) 0 ASSESSMENTS - Ostomy and/or Continence Assessment and Care []  - Incontinence Assessment and Management 0 []  - Ostomy Care Assessment and Management (repouching, etc.) 0 PROCESS - Coordination of Care X - Simple Patient / Family Education for ongoing care 1 15 []  - Complex (extensive) Patient / Family Education for ongoing care 0 []  - Staff obtains Programmer, systems, Records, Test Results / Process Orders 0 []  - Staff telephones HHA, Nursing Homes / Clarify orders / etc 0 [name] ([id number]) []  - Routine Transfer to another Facility (non-emergent condition) 0 []  - Routine Hospital Admission (non-emergent condition) 0 []  - New Admissions / Biomedical engineer / Ordering NPWT, Apligraf, etc. 0 []  - Emergency Hospital Admission (emergent condition) 0 X - Simple Discharge Coordination 1 10 []  - Complex (extensive) Discharge Coordination 0 PROCESS - Special Needs []  - Pediatric / Minor Patient Management 0 []  - Isolation Patient Management 0 []  - Hearing / Language / Visual special needs 0 []  - Assessment of Community assistance (transportation, D/C planning, etc.) 0 []  - Additional assistance / Altered mentation 0 []  - Support Surface(s) Assessment (bed, cushion, seat, etc.) 0 INTERVENTIONS - Wound Cleansing / Measurement X - Simple Wound Cleansing - one wound 1  5 []  - Complex Wound Cleansing - multiple wounds 0 X - Wound Imaging (photographs - any number of wounds)  1 5 []  - Wound Tracing (instead of photographs) 0 X - Simple Wound Measurement - one wound 1 5 []  - Complex Wound Measurement - multiple wounds 0 INTERVENTIONS - Wound Dressings []  - Small Wound Dressing one or multiple wounds 0 []  - Medium Wound Dressing one or multiple wounds 0 []  - Large Wound Dressing one or multiple wounds 0 []  - Application of Medications - topical 0 []  - Application of Medications - injection 0 [name], [name] ([id number]) INTERVENTIONS - Miscellaneous []  - External ear exam 0 []  - Specimen Collection (cultures, biopsies, blood, body fluids, etc.) 0 []  - Specimen(s) / Culture(s) sent or taken to Lab for analysis 0 []  - Patient Transfer (multiple staff / Harrel Lemon Lift / Similar devices) 0 []  - Simple Staple / Suture removal (25 or less) 0 []  - Complex Staple / Suture removal (26 or more) 0 []  - Hypo / Hyperglycemic Management (close monitor of Blood Glucose) 0 []  - Ankle / Brachial Index (ABI) - do not check if billed separately 0 X - Vital Signs 1 5 Has the patient been seen at the hospital within the last three years: Yes Total Score: 70 Level Of Care: New/Established - Level 2 Electronic Signature(s) Signed: 03/17/2015 5:54:02 PM By: [name] Entered By: [name] on 03/17/2015 14:20:45 [name] ([id number]) -------------------------------------------------------------------------------- Encounter Discharge Information Details Patient Name: [name]. Date of Service: 03/17/2015 2:00 PM Medical Record Patient Account Number: [id number] Number: Treating RN: [name] 1932/06/12 (82 y.o. Other Clinician: Date of Birth/Sex: Female) Treating [name] Physician/Extender: [name] Referring Physician: Melina Bailey in Treatment: 2 Encounter Discharge Information Items Discharge Pain  Level: 0 Discharge Condition: Stable Ambulatory Status: Cane Discharge Destination: Home Transportation: Private Auto Accompanied By: self Schedule Follow-up Appointment: No Medication Reconciliation completed and provided to Patient/Care No Mang Hazelrigg: Provided on Clinical Summary of Care: 03/17/2015 Form Type Recipient Paper Patient JW Electronic Signature(s) Signed: 03/17/2015 2:28:34 PM By: Ruthine Dose Entered By: Ruthine Dose on 03/17/2015 14:28:34 Leiphart, Derry Bailey ([id number]) -------------------------------------------------------------------------------- Lower Extremity Assessment Details Patient Name: [name]. Date of Service: 03/17/2015 2:00 PM Medical Record Patient Account Number: [id number] Number: Treating RN: Kande, Dillahunt 07-04-1932 (82 y.o. Other Clinician: Date of Birth/Sex: Female) Treating [name] Physician/Extender: [name] Referring Physician: Melina Bailey in Treatment: 2 Vascular Assessment Pulses: Posterior Tibial Palpable: [Right:Yes] Dorsalis Pedis Palpable: [Right:Yes] Extremity colors, hair growth, and conditions: Extremity Color: [Right:Mottled] Hair Growth on Extremity: [Right:No] Temperature of Extremity: [Right:Warm] Capillary Refill: [Right:< 3 seconds] Electronic Signature(s) Signed: 03/17/2015 5:54:02 PM By: [name] Entered By: [name] on 03/17/2015 14:04:14 Canan, Derry Bailey ([id number]) -------------------------------------------------------------------------------- Multi Wound Chart Details Patient Name: [name]. Date of Service: 03/17/2015 2:00 PM Medical Record Patient Account Number: [id number] Number: Treating RN: Sameria, Contreras 1932-10-11 (82 y.o. Other Clinician: Date of Birth/Sex: Female) Treating [name] Physician/Extender: [name] Referring Physician: Melina Bailey in Treatment: 2 Vital  Signs Height(in): 68 Pulse(bpm): 60 Weight(lbs): 149 Blood Pressure 142/45 (mmHg): Body Mass Index(BMI): 23 Temperature(F): 98.1 Respiratory Rate 18 (breaths/min): Photos: [9:No Photos] [N/A:N/A] Wound Location: [9:Malleolus - Lateral] [N/A:N/A] Wounding Event: [9:Gradually Appeared] [N/A:N/A] Primary Etiology: [9:Arterial Insufficiency Ulcer N/A] Comorbid History: [9:Cataracts, Anemia, Arrhythmia, Hypertension, Osteoarthritis, Received Radiation] [N/A:N/A] Date Acquired: [9:01/25/2015] [N/A:N/A] Weeks of Treatment: [9:2] [N/A:N/A] Wound Status: [9:Open] [N/A:N/A] Measurements L x W x D 0.5x0.5x0.1 [N/A:N/A] (cm) Area (cm) : [9:0.196] [N/A:N/A] Volume (cm) : [  9:0.02] [N/A:N/A] % Reduction in Area: [9:-317.00%] [N/A:N/A] % Reduction in Volume: -300.00% [N/A:N/A] Classification: [9:Full Thickness Without Exposed Support Structures] [N/A:N/A] Exudate Amount: [9:None Present] [N/A:N/A] Wound Margin: [9:Flat and Intact] [N/A:N/A] Granulation Amount: [9:None Present (0%)] [N/A:N/A] Necrotic Amount: [9:Large (67-100%)] [N/A:N/A] Necrotic Tissue: [9:Eschar] [N/A:N/A] Exposed Structures: [9:Fascia: No Fat: No Tendon: No] [N/A:N/A] Muscle: No Joint: No Bone: No Limited to Skin Breakdown Epithelialization: None N/A N/A Periwound Skin Texture: Edema: No N/A N/A Excoriation: No Induration: No Callus: No Crepitus: No Fluctuance: No Friable: No Rash: No Scarring: No Periwound Skin Dry/Scaly: Yes N/A N/A Moisture: Maceration: No Moist: No Periwound Skin Color: Atrophie Blanche: No N/A N/A Cyanosis: No Ecchymosis: No Erythema: No Hemosiderin Staining: No Mottled: No Pallor: No Rubor: No Tenderness on No N/A N/A Palpation: Wound Preparation: Ulcer Cleansing: N/A N/A Rinsed/Irrigated with Saline Topical Anesthetic Applied: Other: lidocaine 4% Treatment Notes Electronic Signature(s) Signed: 03/17/2015 5:54:02 PM By: [name] Entered By: [name]  on 03/17/2015 14:04:31 Egner, Derry Bailey ([id number]) -------------------------------------------------------------------------------- [place] Details Patient Name: [name]. Date of Service: 03/17/2015 2:00 PM Medical Record Patient Account Number: [id number] Number: Treating RN: Hoor, Metzner 15-Jul-1932 (82 y.o. Other Clinician: Date of Birth/Sex: Female) Treating [name] Physician/Extender: [name] Referring Physician: Melina Bailey in Treatment: 2 Active Inactive Electronic Signature(s) Signed: 03/17/2015 5:54:02 PM By: [name] Entered By: [name] on 03/17/2015 14:19:48 Huether, Derry Bailey ([id number]) -------------------------------------------------------------------------------- Patient/Caregiver Education Details Patient Name: [name] Date of Service: 03/17/2015 2:00 PM Medical Record Patient Account Number: [id number] Number: Treating RN: Ethelmae, [place]it[place]t 30-May-1932 (82 y.o. Other Clinician: Date of Birth/Gender: Female) Treating [name] Physician/Extender: [name] Referring Physician: Melina Bailey in Treatment: 2 Education Assessment Education Provided To: Patient Education Topics Provided Basic Hygiene: Handouts: Other: care of new skin on healed ulcer site Methods: Demonstration, Explain/Verbal Responses: State content correctly Electronic Signature(s) Signed: 03/17/2015 5:54:02 PM By: [name] Entered By: [name] on 03/17/2015 14:21:32 Fogel, Derry Bailey ([id number]) -------------------------------------------------------------------------------- Wound Assessment Details Patient Name: [name]. Date of Service: 03/17/2015 2:00 PM Medical Record Patient Account Number: [id number] Number: Treating RN: Teffany, Obando 07/12/1932 (82 y.o. Other Clinician: Date of Birth/Sex: Female) Treating Latoya  III, Primary Care Physician: Emily Bailey Physician/Extender: [name] Referring Physician: Melina Bailey in Treatment: 2 Wound Status Wound Number: [id number] Primary Arterial Insufficiency Ulcer Etiology: Wound Location: Lateral Malleolus Wound Healed - Epithelialized Wounding Event: Gradually Appeared Status: Date Acquired: 01/25/2015 Comorbid Cataracts, Anemia, Arrhythmia, Weeks Of Treatment: 2 History: Hypertension, Osteoarthritis, Received Clustered Wound: No Radiation Photos Photo Uploaded By: [name] on 03/17/2015 16:56:04 Wound Measurements Length: (cm) 0 % Reduction in Width: (cm) 0 % Reduction in Depth: (cm) 0 Epithelializati Area: (cm) 0 Tunneling: Volume: (cm) 0 Undermining: Area: 100% Volume: 100% on: None No No Wound Description Full Thickness Without Exposed Classification: Support Structures Wound Margin: Flat and Intact Exudate None Present Amount: Wound Bed Granulation Amount: None Present (0%) Exposed Structure Necrotic Amount: Large (67-100%) Fascia Exposed: No Bertino, Pricilla A. ([id number]) Necrotic Quality: Eschar Fat Layer Exposed: No Tendon Exposed: No Muscle Exposed: No Joint Exposed: No Bone Exposed: No Limited to Skin Breakdown Periwound Skin Texture Texture Color No Abnormalities Noted: No No Abnormalities Noted: No Callus: No Atrophie Blanche: No Crepitus: No Cyanosis: No Excoriation: No Ecchymosis: No Fluctuance: No Erythema: No Friable: No Hemosiderin Staining: No Induration: No Mottled: No Localized Edema: No Pallor: No Rash: No Rubor: No  Scarring: No Moisture No Abnormalities Noted: No Dry / Scaly: Yes Maceration: No Moist: No Wound Preparation Ulcer Cleansing: Rinsed/Irrigated with Saline Topical Anesthetic Applied: Other: lidocaine 4%, Electronic Signature(s) Signed: 03/17/2015 5:54:02 PM By: [name] Entered By: [name] on 03/17/2015 14:18:48 Fittro, Derry Bailey  ([id number]) -------------------------------------------------------------------------------- Vitals Details Patient Name: [name]. Date of Service: 03/17/2015 2:00 PM Medical Record Patient Account Number: [id number] Number: Treating RN: Matilde, Petruso 24-Sep-1932 (82 y.o. Other Clinician: Date of Birth/Sex: Female) Treating [name] Physician/Extender: [name] Referring Physician: Melina Bailey in Treatment: 2 Vital Signs Time Taken: 14:00 Temperature (F): 98.1 Height (in): 68 Pulse (bpm): 60 Weight (lbs): 149 Respiratory Rate (breaths/min): 18 Body Mass Index (BMI): 22.7 Blood Pressure (mmHg): 142/45 Reference Range: 80 - 120 mg / dl Electronic Signature(s) Signed: 03/17/2015 5:54:02 PM By: [name] Entered By: [name] on 03/17/2015 14:00:25

## 2015-03-18 NOTE — Progress Notes (Signed)
[name], [name] ([id number]) Visit Report for 03/17/2015 Chief Complaint Document Details Patient Name: [name], [name]. Date of Service: 03/17/2015 2:00 PM Medical Record Patient Account Number: [id number] Number: Treating RN: Saidy, Millay [birth year] (82 y.o. Other Clinician: Date of Birth/Sex: Female) Treating BURNS III, Primary Care Physician/Extender: Langston Masker, Elta Guadeloupe Physician: Referring Physician: Melina Modena in Treatment: 2 Information Obtained from: Patient Chief Complaint Chronic right ankle ulceration. Electronic Signature(s) Signed: 03/17/2015 4:02:10 PM By: Loletha Grayer MD Entered By: Loletha Grayer on 03/17/2015 14:46:30 [facility], [name] ([id number]) -------------------------------------------------------------------------------- HPI Details Patient Name: [name] Date of Service: 03/17/2015 2:00 PM Medical Record Patient Account Number: [id number] Number: Treating RN: Raylee, Fulwood [birth year] (82 y.o. Other Clinician: Date of Birth/Sex: Female) Treating BURNS III, Primary Care Physician/Extender: Langston Masker, Elta Guadeloupe Physician: Referring Physician: Melina Modena in Treatment: 2 History of Present Illness HPI Description: Very pleasant 81 year old with history of atrial fibrillation and peripheral vascular disease. She has had a chronic recurrent ulceration at her right lateral malleolus since 2014. Previously healed with Grafix application. Seen by her cardiologist Dr. Kathlyn Sacramento in April 2016. Noninvasive vascular evaluation showed an ABI of 0.68 on the right and 0.85 on the left. 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 One-vessel runoff below the knee via the peroneal artery with short occlusion of the anterior tibial artery and reconstitution. She developed a recurrent ulceration at her right lateral malleolus in October 2016. Right ankle  x-ray 03/09/2015 negative for osteomyelitis. Ambulating per her baseline. No significant claudication. She has been applying mupirocin cream and completed a course of clindamycin. Compression stockings too painful at this point. She returns to clinic for follow-up and says that she is doing well. Pain improved. No fever or chills. No drainage. o Engineer, maintenance) Signed: 03/17/2015 4:02:10 PM By: Loletha Grayer MD Entered By: Loletha Grayer on 03/17/2015 14:49:34 [name], [name] ([id number]) -------------------------------------------------------------------------------- Physical Exam Details Patient Name: [name] Date of Service: 03/17/2015 2:00 PM Medical Record Patient Account Number: [id number] Number: Treating RN: Ricca, Ostermiller [birth year] (82 y.o. Other Clinician: Date of Birth/Sex: Female) Treating BURNS III, Primary Care Physician/Extender: Langston Masker, Elta Guadeloupe Physician: Referring Physician: Melina Modena in Treatment: 2 Constitutional . Pulse regular. Respirations normal and unlabored. Afebrile. . Notes Right lateral malleolus ulceration his re-epithelialized. Minimal surrounding erythema. No significant cellulitis. Mild edema. Multiple varicosities. No palpable pedal pulses per her baseline. Dopplerable DP. Right ABI 0.68. Electronic Signature(s) Signed: 03/17/2015 4:02:10 PM By: Loletha Grayer MD Entered By: Loletha Grayer on 03/17/2015 14:50:31 [name], [name] ([id number]) -------------------------------------------------------------------------------- Physician Orders Details Patient Name: [name] Date of Service: 03/17/2015 2:00 PM Medical Record Patient Account Number: [id number] Number: Treating RN: Shaeley, Yaklin [birth year] (82 y.o. Other Clinician: Date of Birth/Sex: Female) Treating BURNS III, Primary Care Physician/Extender: Langston Masker, Elta Guadeloupe Physician: Referring Physician: Melina Modena in  Treatment: 2 Verbal / Phone Orders: Yes Clinician: Montey Hora Read Back and Verified: Yes Diagnosis Coding Discharge From [facility] Services o Discharge from [place] Signature(s) Signed: 03/17/2015 4:02:10 PM By: Loletha Grayer MD Signed: 03/17/2015 5:54:02 PM By: Montey Hora Entered By: Montey Hora on 03/17/2015 14:20:15 [name], [name] ([id number]) -------------------------------------------------------------------------------- Problem List Details Patient Name: [name]. Date of Service: 03/17/2015 2:00 PM Medical Record Patient Account Number: [id number] Number: Treating RN: Jacelin, Edmon [birth year] (82 y.o. 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 Date of Service: 03/17/2015 2:00 PM Medical Record Patient Account Number: [id number] Number: Treating RN: [name], [name] [birth year] (82 y.o. Other Clinician: Date of Birth/Sex: Female) Treating BURNS III, Primary Care Physician/Extender: Langston Masker, Elta Guadeloupe Physician: Referring Physician: Melina Modena in Treatment: 2 Subjective Chief Complaint Information obtained from Patient Chronic right ankle  ulceration. History of Present Illness (HPI) Very pleasant 81 year old with history of atrial fibrillation and peripheral vascular disease. She has had a chronic recurrent ulceration at her right lateral malleolus since 2014. Previously healed with Grafix application. Seen by her cardiologist Dr. Kathlyn Sacramento in April 2016. Noninvasive vascular evaluation showed an ABI of 0.68 on the right and 0.85 on the left. 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 One-vessel runoff below the knee via the peroneal artery with short occlusion of the anterior tibial artery and reconstitution. She developed a recurrent ulceration at her right lateral malleolus in October 2016. Right ankle x-ray 03/09/2015 negative for osteomyelitis. Ambulating per her baseline. No significant claudication. She has been applying mupirocin cream and completed a course of clindamycin. Compression stockings too painful at this point. She returns to clinic for follow-up and says that she is doing well. Pain improved. No fever or chills. No drainage. Objective Constitutional Pulse regular. Respirations normal and unlabored. Afebrile. [name], [name] ([id number]) Vitals Time Taken: 2:00 PM, Height: 68 in, Weight: 149 lbs, BMI: 22.7, Temperature: 98.1 F, Pulse: 60 bpm, Respiratory Rate: 18 breaths/min, Blood Pressure: 142/45 mmHg. General Notes: Right lateral malleolus ulceration his re-epithelialized. Minimal surrounding erythema. No significant cellulitis. Mild edema. Multiple varicosities. No palpable pedal pulses per her baseline. Dopplerable DP. Right ABI 0.68. Integumentary (Hair, Skin) Wound #9 status is Healed - Epithelialized. Original cause of wound was Gradually Appeared. The wound is located on the Lateral Malleolus. The wound measures 0cm length x 0cm width x 0cm depth; 0cm^2 area and 0cm^3 volume. The wound is limited to skin breakdown. There  is no tunneling or undermining noted. There is a none present amount of drainage noted. The wound margin is flat and intact. There is no granulation within the wound bed. There is a large (67-100%) amount of necrotic tissue within the wound bed including Eschar. The periwound skin appearance exhibited: Dry/Scaly. The periwound skin appearance did not exhibit: Callus, Crepitus, Excoriation, Fluctuance, Friable, Induration, Localized Edema, Rash, Scarring, Maceration, Moist, Atrophie Blanche, Cyanosis, Ecchymosis, Hemosiderin Staining, Mottled, Pallor, Rubor, Erythema. 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 Etiology probably secondary to a combination of chronic venous insufficiency and arterial insufficiency. Plan Discharge From [facility] Services: Discharge from [place], [name]. ([id number]) I reassured her that everything looks good. I recommended that she apply mupirocin cream for the  next 2 weeks. She was given a Tubigrip for edema control, which she will hopefully tolerate better than a compression stocking. I encouraged her to call with any questions or concerns. Otherwise, we will plan on seeing her back in the wound clinic on a when necessary basis. She is planning to follow up with her cardiologist regarding her peripheral vascular disease. Electronic Signature(s) Signed: 03/17/2015 4:02:10 PM By: Loletha Grayer MD Entered By: Loletha Grayer on 03/17/2015 14:52:42 Scheerer, [name] ([id number]) -------------------------------------------------------------------------------- SuperBill Details Patient Name: [name] Date of Service: 03/17/2015 Medical Record Patient Account Number: [id number] Number: Treating  RN: Blakelynn, Sekelsky Aug 10, 1932 (82 y.o. 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

## 2015-05-05 ENCOUNTER — Encounter: Payer: PPO | Attending: Surgery | Admitting: Surgery

## 2015-05-05 DIAGNOSIS — I482 Chronic atrial fibrillation: Secondary | ICD-10-CM | POA: Insufficient documentation

## 2015-05-05 DIAGNOSIS — M1990 Unspecified osteoarthritis, unspecified site: Secondary | ICD-10-CM | POA: Insufficient documentation

## 2015-05-05 DIAGNOSIS — L03115 Cellulitis of right lower limb: Secondary | ICD-10-CM | POA: Diagnosis not present

## 2015-05-05 DIAGNOSIS — I739 Peripheral vascular disease, unspecified: Secondary | ICD-10-CM | POA: Insufficient documentation

## 2015-05-05 DIAGNOSIS — Z853 Personal history of malignant neoplasm of breast: Secondary | ICD-10-CM | POA: Diagnosis not present

## 2015-05-05 DIAGNOSIS — I10 Essential (primary) hypertension: Secondary | ICD-10-CM | POA: Insufficient documentation

## 2015-05-05 DIAGNOSIS — X58XXXA Exposure to other specified factors, initial encounter: Secondary | ICD-10-CM | POA: Diagnosis not present

## 2015-05-05 DIAGNOSIS — S81801A Unspecified open wound, right lower leg, initial encounter: Secondary | ICD-10-CM | POA: Diagnosis not present

## 2015-05-06 ENCOUNTER — Other Ambulatory Visit
Admission: RE | Admit: 2015-05-06 | Discharge: 2015-05-06 | Disposition: A | Discharge status: Home / Self Care | Payer: PPO | Source: Ambulatory Visit | Attending: Surgery | Admitting: Surgery

## 2015-05-06 DIAGNOSIS — Z029 Encounter for administrative examinations, unspecified: Secondary | ICD-10-CM | POA: Insufficient documentation

## 2015-05-06 NOTE — Progress Notes (Signed)
[name], [name] ([id number]) Visit Report for 05/05/2015 Allergy List Details Patient Name: [name], [name]. Date of Service: 05/05/2015 2:30 PM Medical Record Patient Account Number: [id number] Number: Treating RN: Ahmed Prima Jun 11, 1932 (82 y.o. Other Clinician: Date of Birth/Sex: Female) Treating BURNS III, Primary Care Physician: Emily Filbert Physician/Extender: Thayer Jew Referring Physician: Melina Modena in Treatment: 0 Allergies Active Allergies Lipitor Reaction: joint pain Severity: Moderate Penicillins Reaction: rash Severity: Moderate Vioxx Reaction: rash Cipro Reaction: nausea doxycycline Reaction: nausea, aching, itching, shaky Severity: Moderate Active: 02/23/2014 amlodipine Reaction: Nausea and vomiting Macrobid Reaction: rash Allergy Notes Electronic Signature(s) Signed: 05/05/2015 5:35:56 PM By: Margot Ables ([id number]) Entered By: Alric Quan on 05/05/2015 15:01:57 Hostetler, [name] ([id number]) -------------------------------------------------------------------------------- Arrival Information Details Patient Name: [name]. Date of Service: 05/05/2015 2:30 PM Medical Record Patient Account Number: [id number] Number: Treating RN: Ahmed Prima November 14, 1932 (82 y.o. Other Clinician: Date of Birth/Sex: Female) Treating BURNS III, Primary Care Physician: Emily Filbert Physician/Extender: Thayer Jew Referring Physician: Melina Modena in Treatment: 0 Visit Information Patient Arrived: Cane Arrival Time: 14:56 Accompanied By: husband Transfer Assistance: None Patient Identification Verified: Yes Secondary Verification Process Yes Completed: Patient Requires Transmission-Based No Precautions: Patient Has Alerts: Yes Patient Alerts: ASA History Since Last Visit All ordered tests and consults were completed: No Added or deleted any medications: No Any new allergies or adverse reactions: No Had a fall or  experienced change in activities of daily living that may affect risk of falls: Yes Signs or symptoms of abuse/neglect since last visito No Hospitalized since last visit: No Electronic Signature(s) Signed: 05/05/2015 5:35:56 PM By: Alric Quan Entered By: Alric Quan on 05/05/2015 15:45:17 Redmann, [name] ([id number]) -------------------------------------------------------------------------------- Encounter Discharge Information Details Patient Name: [name]. Date of Service: 05/05/2015 2:30 PM Medical Record Patient Account Number: [id number] Number: Treating RN: Ahmed Prima 1932-06-17 (82 y.o. Other Clinician: Date of Birth/Sex: Female) Treating BURNS III, Primary Care Physician: Emily Filbert Physician/Extender: Thayer Jew Referring Physician: Melina Modena in Treatment: 0 Encounter Discharge Information Items Discharge Pain Level: 0 Discharge Condition: Stable Ambulatory Status: Cane Discharge Destination: Home Private Transportation: Auto Accompanied By: husband Schedule Follow-up Appointment: Yes Medication Reconciliation completed and Yes provided to Patient/Care Shannan Garfinkel: Clinical Summary of Care: Electronic Signature(s) Signed: 05/05/2015 5:35:56 PM By: Alric Quan Previous Signature: 05/05/2015 3:44:36 PM Version By: Ruthine Dose Entered By: Alric Quan on 05/05/2015 15:44:52 Napier, [name] ([id number]) -------------------------------------------------------------------------------- Lower Extremity Assessment Details Patient Name: [name]. Date of Service: 05/05/2015 2:30 PM Medical Record Patient Account Number: [id number] Number: Treating RN: Ahmed Prima 04-01-1933 (82 y.o. Other Clinician: Date of Birth/Sex: Female) Treating BURNS III, Primary Care Physician: Emily Filbert Physician/Extender: Thayer Jew Referring Physician: Melina Modena in Treatment: 0 Edema Assessment Assessed: [Left: No] [Right:  No] E[Left: dema] [Right: :] Calf Left: Right: Point of Measurement: 34 cm From Medial Instep cm 32.5 cm Ankle Left: Right: Point of Measurement: 10 cm From Medial Instep cm 23.8 cm Vascular Assessment Pulses: Posterior Tibial Dorsalis Pedis Palpable: [Right:Yes] Extremity colors, hair growth, and conditions: Extremity Color: [Right:Red] Hair Growth on Extremity: [Right:Yes] Temperature of Extremity: [Right:Warm] Capillary Refill: [Right:< 3 seconds] Toe Nail Assessment Left: Right: Thick: No Discolored: No Deformed: No Improper Length and Hygiene: No Electronic Signature(s) Signed: 05/05/2015 5:35:56 PM By: Alric Quan Entered By: Alric Quan on 05/05/2015 15:09:47 Uriostegui, [name] ([id number]) Semidey, [name] ([id number]) -------------------------------------------------------------------------------- Multi Wound Chart Details Patient Name: [name]. Date of Service: 05/05/2015 2:30 PM Medical  Record Patient Account Number: [id number] Number: Treating RN: Ahmed Prima January 21, 1933 (82 y.o. Other Clinician: Date of Birth/Sex: Female) Treating BURNS III, Primary Care Physician: Emily Filbert Physician/Extender: Thayer Jew Referring Physician: Melina Modena in Treatment: 0 Vital Signs Height(in): 68 Pulse(bpm): 51 Weight(lbs): 150 Blood Pressure 129/39 (mmHg): Body Mass Index(BMI): 23 Temperature(F): 97.6 Respiratory Rate 20 (breaths/min): Wound Assessments Treatment Notes Electronic Signature(s) Signed: 05/05/2015 5:35:56 PM By: Alric Quan Entered By: Alric Quan on 05/05/2015 15:08:10 Borelli, [name] ([id number]) -------------------------------------------------------------------------------- [place] Details Patient Name: [name]. Date of Service: 05/05/2015 2:30 PM Medical Record Patient Account Number: [id number] Number: Treating RN: Ahmed Prima 1933-01-14 (82 y.o. Other Clinician: Date of  Birth/Sex: Female) Treating BURNS III, Primary Care Physician: Emily Filbert Physician/Extender: Thayer Jew Referring Physician: Melina Modena in Treatment: 0 Active Inactive Electronic Signature(s) Signed: 05/05/2015 5:35:56 PM By: Alric Quan Entered By: Alric Quan on 05/05/2015 15:07:59 Hass, [name] ([id number]) -------------------------------------------------------------------------------- Pain Assessment Details Patient Name: [name]. Date of Service: 05/05/2015 2:30 PM Medical Record Patient Account Number: [id number] Number: Treating RN: Ahmed Prima 1932/10/15 (82 y.o. Other Clinician: Date of Birth/Sex: Female) Treating BURNS III, Primary Care Physician: Emily Filbert Physician/Extender: Thayer Jew Referring Physician: Melina Modena in Treatment: 0 Active Problems Location of Pain Severity and Description of Pain Patient Has Paino Yes Site Locations Pain Location: Pain in Ulcers Duration of the Pain. Constant / Intermittento Constant Rate the pain. Current Pain Level: 6 Character of Pain Describe the Pain: Aching Pain Management and Medication Current Pain Management: Electronic Signature(s) Signed: 05/05/2015 5:35:56 PM By: Alric Quan Entered By: Alric Quan on 05/05/2015 14:58:19 Tiggs, [name] ([id number]) -------------------------------------------------------------------------------- Patient/Caregiver Education Details Patient Name: [name]. Date of Service: 05/05/2015 2:30 PM Medical Record Patient Account Number: [id number] Number: Treating RN: Ahmed Prima May 18, 1932 (82 y.o. Other Clinician: Date of Birth/Gender: Female) Treating BURNS III, Primary Care Physician: Emily Filbert Physician/Extender: Thayer Jew Referring Physician: Melina Modena in Treatment: 0 Education Assessment Education Provided To: Patient Education Topics Provided Wound/Skin Impairment: Handouts: Other: change dressing as  ordered Methods: Demonstration, Explain/Verbal Responses: State content correctly Electronic Signature(s) Signed: 05/05/2015 5:35:56 PM By: Alric Quan Entered By: Alric Quan on 05/05/2015 15:19:23 Lanz, [name] ([id number]) -------------------------------------------------------------------------------- Wound Assessment Details Patient Name: [name]. Date of Service: 05/05/2015 2:30 PM Medical Record Patient Account Number: [id number] Number: Treating RN: Ahmed Prima 1933/02/20 (82 y.o. Other Clinician: Date of Birth/Sex: Female) Treating BURNS III, Primary Care Physician: Emily Filbert Physician/Extender: Thayer Jew Referring Physician: Melina Modena in Treatment: 0 Wound Status Wound Number: 10 Primary Trauma, Other Etiology: Wound Location: Left Lower Leg - Midline Wound Open Wounding Event: Trauma Status: Date Acquired: 04/30/2015 Comorbid Cataracts, Anemia, Arrhythmia, Weeks Of Treatment: 0 History: Hypertension, Osteoarthritis, Received Clustered Wound: No Radiation Photos Photo Uploaded By: Alric Quan on 05/05/2015 17:23:34 Wound Measurements Length: (cm) 4.5 % Reduction in Width: (cm) 5 % Reduction in Depth: (cm) 0.1 Epithelializati Area: (cm) 17.671 Tunneling: Volume: (cm) 1.767 Undermining: Area: Volume: on: None No No Wound Description Classification: Partial Thickness Wound Margin: Flat and Intact Exudate Amount: None Present Foul Odor After Cleansing: No Wound Bed Granulation Amount: None Present (0%) Exposed Structure Necrotic Amount: Large (67-100%) Fascia Exposed: No Necrotic Quality: Eschar Fat Layer Exposed: No Tendon Exposed: No Judy, Shelie A. ([id number]) Muscle Exposed: No Joint Exposed: No Bone Exposed: No Limited to Skin Breakdown Periwound Skin Texture Texture Color No Abnormalities Noted: No No Abnormalities Noted: No Localized Edema: Yes Erythema:  Yes Erythema Location:  Circumferential Moisture Erythema Measurement: Marked No Abnormalities Noted: No Erythema Change: No Change Dry / Scaly: No Maceration: No Moist: No Wound Preparation Ulcer Cleansing: Rinsed/Irrigated with Saline Topical Anesthetic Applied: Other: lidocaine 4%, Treatment Notes Wound #10 (Left, Midline Lower Leg) 1. Cleansed with: Clean wound with Normal Saline 2. Anesthetic Topical Lidocaine 4% cream to wound bed prior to debridement 4. Dressing Applied: Aquacel Ag 5. Secondary Dressing Applied Guaze, ABD and kerlix/Conform 6. Footwear/Offloading device applied Ace wrap 7. Secured with Recruitment consultant) Signed: 05/05/2015 5:35:56 PM By: Alric Quan Entered By: Alric Quan on 05/05/2015 15:16:22 Gillin, [name] ([id number]) -------------------------------------------------------------------------------- [place] Details Patient Name: [name]. Date of Service: 05/05/2015 2:30 PM Medical Record Patient Account Number: [id number] Number: Treating RN: Ahmed Prima October 23, 1932 (82 y.o. Other Clinician: Date of Birth/Sex: Female) Treating BURNS III, Primary Care Physician: Emily Filbert Physician/Extender: Thayer Jew Referring Physician: Melina Modena in Treatment: 0 Vital Signs Time Taken: 14:58 Temperature (F): 97.6 Height (in): 68 Pulse (bpm): 51 Source: Stated Respiratory Rate (breaths/min): 20 Weight (lbs): 150 Blood Pressure (mmHg): 129/39 Source: Stated Reference Range: 80 - 120 mg / dl Body Mass Index (BMI): 22.8 Electronic Signature(s) Signed: 05/05/2015 5:35:56 PM By: Alric Quan Entered By: Alric Quan on 05/05/2015 15:01:45

## 2015-05-06 NOTE — Progress Notes (Signed)
SULTANA, STOCKHAUSEN ([id number]) Visit Report for 05/05/2015 Abuse/Suicide Risk Screen Details Patient Name: [name], [name]. Date of Service: 05/05/2015 2:30 PM Medical Record Patient Account Number: [id number] Number: Treating RN: Ahmed Prima [birth year] (82 y.o. Other Clinician: Date of Birth/Sex: Female) Treating BURNS III, Primary Care Physician/Extender: Langston Masker, Elta [place] Physician: Referring Physician: Melina Modena in Treatment: 0 Abuse/Suicide Risk Screen Items Answer ABUSE/SUICIDE RISK SCREEN: Has anyone close to you tried to hurt or harm you recentlyo No Do you feel uncomfortable with anyone in your familyo No Has anyone forced you do things that you didnot want to doo No Do you have any thoughts of harming yourselfo No Patient displays signs or symptoms of abuse and/or neglect. No Electronic Signature(s) Signed: 05/05/2015 5:35:56 PM By: Alric Quan Entered By: Alric Quan on 05/05/2015 15:03:40 [name], [name] ([id number]) -------------------------------------------------------------------------------- Activities of Daily Living Details Patient Name: [name], [name]. Date of Service: 05/05/2015 2:30 PM Medical Record Patient Account Number: [id number] Number: Treating RN: Ahmed Prima [birth year] (82 y.o. Other Clinician: Date of Birth/Sex: Female) Treating BURNS III, Primary Care Physician/Extender: Langston Masker, Elta [place] Physician: Referring Physician: Melina Modena in Treatment: 0 Activities of Daily Living Items Answer Activities of Daily Living (Please select one for each item) Drive Automobile Completely Able Take Medications Completely Able Use Telephone Completely Able Care for Appearance Completely Able Use Toilet Completely Able Bath / Shower Completely Able Dress Self Completely Able Feed Self Completely Able Walk Completely Able Get In / Out Bed Completely Able Housework Completely Able Prepare Meals Completely  [place] for Self Completely Able Electronic Signature(s) Signed: 05/05/2015 5:35:56 PM By: Alric Quan Entered By: Alric Quan on 05/05/2015 15:04:03 [name], [name] ([id number]) -------------------------------------------------------------------------------- Education Assessment Details Patient Name: [name] Date of Service: 05/05/2015 2:30 PM Medical Record Patient Account Number: [id number] Number: Treating RN: Ahmed Prima 1932/05/22 (82 y.o. Other Clinician: Date of Birth/Sex: Female) Treating BURNS III, Primary Care Physician/Extender: Langston Masker, Elta [place] Physician: Referring Physician: Melina Modena in Treatment: 0 Primary Learner Assessed: Patient Learning Preferences/Education Level/Primary Language Learning Preference: Explanation, Printed Material Cognitive Barrier Assessment/Beliefs Language Barrier: No Translator Needed: No Memory Deficit: No Emotional Barrier: No Cultural/Religious Beliefs Affecting Medical No Care: Physical Barrier Assessment Impaired Vision: Yes Glasses Impaired Hearing: No Decreased Hand dexterity: No Knowledge/Comprehension Assessment Knowledge Level: High Comprehension Level: High Ability to understand written High instructions: Ability to understand verbal High instructions: Motivation Assessment Anxiety Level: Calm Cooperation: Cooperative Education Importance: Acknowledges Need Interest in Health Problems: Asks Questions Perception: Coherent Willingness to Engage in Self- High Management Activities: Readiness to Engage in Self- High Management Activities: [name], [name] ([id number]) Electronic Signature(s) Signed: 05/05/2015 5:35:56 PM By: Alric Quan Entered By: Alric Quan on 05/05/2015 15:04:21 [name], [name] ([id number]) -------------------------------------------------------------------------------- Fall Risk Assessment Details Patient Name: [name] Date of Service: 05/05/2015 2:30 PM Medical Record Patient Account Number: [id number] Number: Treating RN: Ahmed Prima 1933-01-12 (82 y.o. Other Clinician: Date of Birth/Sex: Female) Treating BURNS III, Primary Care Physician/Extender: Langston Masker, Elta [place] Physician: Referring Physician: Melina Modena in Treatment: 0 Fall Risk Assessment Items Have you had 2 or more falls in the last 12 monthso 0 Yes Have you had any fall that resulted in injury in the last 12 monthso 0 Yes FALL RISK ASSESSMENT: History of falling - immediate or within 3 months 25 Yes Secondary diagnosis 0 No Ambulatory aid None/bed rest/wheelchair/nurse 0 No Crutches/cane/walker 0 No Furniture 0 No IV  Access/Saline Lock 0 No Gait/Training Normal/bed rest/immobile 0 No Weak 10 Yes Impaired 0 No Mental Status Oriented to own ability 0 No Electronic Signature(s) Signed: 05/05/2015 5:35:56 PM By: Alric Quan Entered By: Alric Quan on 05/05/2015 15:04:47 [name], [name] ([id number]) -------------------------------------------------------------------------------- Foot Assessment Details Patient Name: [name]. Date of Service: 05/05/2015 2:30 PM Medical Record Patient Account Number: [id number] Number: Treating RN: Ahmed Prima Aug 27, 1932 (82 y.o. Other Clinician: Date of Birth/Sex: Female) Treating BURNS III, Primary Care Physician/Extender: Langston Masker, Elta [place] Physician: Referring Physician: Melina Modena in Treatment: 0 Foot Assessment Items Site Locations + = Sensation present, - = Sensation absent, C = Callus, U = Ulcer R = Redness, W = Warmth, M = Maceration, PU = Pre-ulcerative lesion F = Fissure, S = Swelling, D = Dryness Assessment Right: Left: Other Deformity: No No Prior Foot Ulcer: No No Prior Amputation: No No Charcot Joint: No No Ambulatory Status: Ambulatory Without Help Gait: Steady Electronic Signature(s) Signed: 05/05/2015 5:35:56  PM By: Alric Quan Entered By: Alric Quan on 05/05/2015 15:07:46 [name], [name] ([id number]) [name], [name] ([id number]) -------------------------------------------------------------------------------- Nutrition Risk Assessment Details Patient Name: [name]. Date of Service: 05/05/2015 2:30 PM Medical Record Patient Account Number: [id number] Number: Treating RN: Ahmed Prima March 29, 1933 (82 y.o. Other Clinician: Date of Birth/Sex: Female) Treating BURNS III, Primary Care Physician/Extender: Langston Masker, Elta [place] Physician: Referring Physician: Melina Modena in Treatment: 0 Height (in): 68 Weight (lbs): 150 Body Mass Index (BMI): 22.8 Nutrition Risk Assessment Items NUTRITION RISK SCREEN: I have an illness or condition that made me change the kind and/or 2 Yes amount of food I eat I eat fewer than two meals per day 0 No I eat few fruits and vegetables, or milk products 0 No I have three or more drinks of beer, liquor or wine almost every day 0 No I have tooth or mouth problems that make it hard for me to eat 0 No I don't always have enough money to buy the food I need 0 No I eat alone most of the time 0 No I take three or more different prescribed or over-the-counter drugs a 1 Yes day Without wanting to, I have lost or gained 10 pounds in the last six 2 Yes months I am not always physically able to shop, cook and/or feed myself 0 No Nutrition Protocols Good Risk Protocol Moderate Risk Protocol Electronic Signature(s) Signed: 05/05/2015 5:35:56 PM By: Alric Quan Entered By: Alric Quan on 05/05/2015 15:05:42

## 2015-05-06 NOTE — Progress Notes (Signed)
KEAUNDRA, EATHERTON ([id number]) Visit Report for 05/05/2015 Chief Complaint Document Details Patient Name: [name], [name]. Date of Service: 05/05/2015 2:30 PM Medical Record Patient Account Number: [id number] Number: Treating RN: Ahmed Prima 02-Mar-1933 (82 y.o. Other Clinician: Date of Birth/Sex: Female) Treating BURNS III, Primary Care Physician/Extender: Langston Masker, Elta Gu[place]oupe Physician: Referring Physician: Melina Modena in Treatment: 0 Information Obtained from: Patient Chief Complaint Right anterior calf traumatic hematoma. Cellulitis. Electronic Signature(s) Signed: 05/05/2015 4:12:28 PM By: Loletha Grayer MD Entered By: Loletha Grayer on 05/05/2015 16:04:05 Mcmichael, [name] ([id number]) -------------------------------------------------------------------------------- HPI Details Patient Name: [name] Date of Service: 05/05/2015 2:30 PM Medical Record Patient Account Number: [id number] Number: Treating RN: Ahmed Prima 1933-01-05 (82 y.o. Other Clinician: Date of Birth/Sex: Female) Treating BURNS III, Primary Care Physician/Extender: Langston Masker, Elta Gu[place] Physician: Referring Physician: Melina Modena in Treatment: 0 History of Present Illness HPI Description: Very pleasant 82 year old with history of atrial fibrillation and peripheral vascular disease. 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 Healed as of November 2016. Seen by her cardiologist Dr. Kathlyn Sacramento in April 2016. Noninvasive vascular evaluation showed an ABI of 0.68 on the right and 0.85 on the left. 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 One-vessel runoff below the knee via the peroneal artery with short occlusion of the anterior tibial artery and reconstitution. No intervention performed. She hit her leg on a wooden duck  2 days ago and noted immediate swelling. This has not significantly increased in size but has become "softer". No drainage. Mild pain. Ambulating per her baseline. No significant claudication. No antibiotics. No fever or chills. No drainage. Unable to apply her compression stockings. Electronic Signature(s) Signed: 05/05/2015 4:12:28 PM By: Loletha Grayer MD Entered By: Loletha Grayer on 05/05/2015 16:07:25 Chohan, [name] ([id number]) -------------------------------------------------------------------------------- Incision and Drainage Details Patient Name: [name] Date of Service: 05/05/2015 2:30 PM Medical Record Patient Account Number: [id number] Number: Treating RN: Ahmed Prima [birth year] (82 y.o. Other Clinician: Date of Birth/Sex: Female) Treating BURNS III, Primary Care Physician/Extender: Langston Masker, Elta Gu[place] Physician: Referring Physician: Melina Modena in Treatment: 0 Incision And Drainage Wound #10 Left, Midline Lower Leg Performed for: Performed By: Physician BURNS III, Teressa Senter., MD Incision And Drainage Hematoma / Seroma Type: Location: right lower leg Time-Out Taken: Yes Pain Control: Other Drainage Of: Serous Instrument: Blade Bleeding: None Culture Sent: Swab Procedural Pain: 0 Post Procedural Pain: 0 Response to Procedure was tolerated well Treatment: Post Procedure Diagnosis Same as Pre-procedure Electronic Signature(s) Signed: 05/05/2015 4:12:28 PM By: Loletha Grayer MD Entered By: Loletha Grayer on 05/05/2015 16:03:33 Marte, [name] ([id number]) -------------------------------------------------------------------------------- Physical Exam Details Patient Name: [name] Date of Service: 05/05/2015 2:30 PM Medical Record Patient Account Number: [id number] Number: Treating RN: Ahmed Prima January 20, 1933 (82 y.o. Other Clinician: Date of Birth/Sex: Female) Treating BURNS III, Primary Care  Physician/Extender: Langston Masker, Elta Guadeloupe Physician: Referring Physician: Melina Modena in Treatment: 0 Constitutional . Pulse regular. Respirations normal and unlabored. Afebrile. Marland Kitchen Respiratory WNL. No retractions.. Cardiovascular . Integumentary (Hair, Skin) .Marland Kitchen Neurological Sensation normal to touch, pin,and vibration. Psychiatric Judgement and insight Intact.. Oriented times 3.. No evidence of depression, anxiety, or agitation.. Notes Right anterior calf hematoma. Partially liquefied. Cellulitis involving the distal right anterior calf. 1+ pitting edema. No palpable pedal pulses per her baseline. Dopplerable DP.  Right ABI 0.68. The hematoma was prepped with Betadine and opened inferiorly with a scalpel. Approximately 2 cc of liquefied hematoma was evacuated. Cultures obtained. The majority of the hematoma was consolidated and adherent. Patient tolerated well. Electronic Signature(s) Signed: 05/05/2015 4:12:28 PM By: Loletha Grayer MD Entered By: Loletha Grayer on 05/05/2015 16:09:47 Almeda, [name] ([id number]) -------------------------------------------------------------------------------- Physician Orders Details Patient Name: [name] Date of Service: 05/05/2015 2:30 PM Medical Record Patient Account Number: [id number] Number: Treating RN: Ahmed Prima 1932-06-11 (82 y.o. Other Clinician: Date of Birth/Sex: Female) Treating BURNS III, Primary Care Physician/Extender: Langston Masker, Elta Gu[place] Physician: Referring Physician: Melina Modena in Treatment: 0 Verbal / Phone Orders: Yes Clinician: Carolyne Fiscal, Debi Read Back and Verified: Yes Diagnosis Coding Wound Cleansing Wound #10 Left,Midline Lower Leg o Clean wound with Normal Saline. Anesthetic Wound #10 Left,Midline Lower Leg o Topical Lidocaine 4% cream applied to wound bed prior to debridement Primary Wound Dressing Wound #10 Left,Midline Lower Leg o Aquacel Ag Secondary  Dressing Wound #10 Left,Midline Lower Leg o Gauze, ABD and Kerlix/Conform Dressing Change Frequency Wound #10 Left,Midline Lower Leg o Change dressing every day. Follow-up Appointments Wound #10 Left,Midline Lower Leg o Return Appointment in 1 week. Edema Control Wound #10 Left,Midline Lower Leg o Elevate legs to the level of the heart and pump ankles as often as possible o Other: - ace wrap Medications-please add to medication list. Wound #10 Left,Midline Lower Leg o P.O. Antibiotics - Clindamycin 300mg  TID x 1 week GAYLIN, HOSBACH ([id number]) o Topical Antibiotic - Lidocaine Topical 4% apply to right leg BID x 2 weeks Laboratory o Culture and Sensitivity - from wound Electronic Signature(s) Signed: 05/05/2015 4:12:28 PM By: Loletha Grayer MD Signed: 05/05/2015 5:35:56 PM By: Alric Quan Previous Signature: 05/05/2015 3:36:36 PM Version By: Loletha Grayer MD Entered By: Alric Quan on 05/05/2015 15:43:55 Hanzlik, [name] ([id number]) -------------------------------------------------------------------------------- Problem List Details Patient Name: KHYLI, WEYER A. Date of Service: 05/05/2015 2:30 PM Medical Record Patient Account Number: [id number] Number: Treating RN: Ahmed Prima Oct 30, 1932 (82 y.o. Other Clinician: Date of Birth/Sex: Female) Treating BURNS III, Primary Care Physician/Extender: Langston Masker, Elta Gu[place] Physician: Referring Physician: Melina Modena in Treatment: 0 Active Problems ICD-10 Encounter Code Description Active Date Diagnosis S81.801A Unspecified open wound, right lower leg, initial encounter 05/05/2015 Yes I48.2 Chronic atrial fibrillation 05/05/2015 Yes L03.115 Cellulitis of right lower limb 05/05/2015 Yes Inactive Problems Resolved Problems Electronic Signature(s) Signed: 05/05/2015 4:12:28 PM By: Loletha Grayer MD Entered By: Loletha Grayer on 05/05/2015 16:04:29 Applin, [name]  ([id number]) -------------------------------------------------------------------------------- Progress Note/History and Physical Details Patient Name: [name] Date of Service: 05/05/2015 2:30 PM Medical Record Patient Account Number: [id number] Number: Treating RN: Ahmed Prima 12-14-1932 (82 y.o. Other Clinician: Date of Birth/Sex: Female) Treating BURNS III, Primary Care Physician/Extender: Langston Masker, Elta Gu[place]oupe Physician: Referring Physician: Melina Modena in Treatment: 0 Subjective Chief Complaint Information obtained from Patient Right anterior calf traumatic hematoma. Cellulitis. History of Present Illness (HPI) Very pleasant 82 year old with history of atrial fibrillation and peripheral vascular disease. 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 Healed as of November 2016. Seen by her cardiologist Dr. Kathlyn Sacramento in April 2016. Noninvasive vascular evaluation showed an ABI of 0.68 on the right and 0.85 on the left. 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 One-vessel runoff below the knee  via the peroneal artery with short occlusion of the anterior tibial artery and reconstitution. No intervention performed. She hit her leg on a wooden duck 2 days ago and noted immediate swelling. This has not significantly increased in size but has become "softer". No drainage. Mild pain. Ambulating per her baseline. No significant claudication. No antibiotics. No fever or chills. No drainage. Unable to apply her compression stockings. Wound History Patient presents with 1 open wound that has been present for approximately since friday. Patient has been treating wound in the following manner: keeping it covered. Laboratory tests have not been performed in the last month. Patient reportedly has not tested positive for  an antibiotic resistant organism. Patient reportedly has not tested positive for osteomyelitis. Patient reportedly has had testing performed to evaluate circulation in the legs. Patient experiences the following problems associated with their wounds: swelling. Patient History Information obtained from Patient. Allergies Lipitor (Severity: Moderate, Reaction: joint pain), Penicillins (Severity: Moderate, Reaction: rash), Vioxx (Reaction: rash), Cipro (Reaction: nausea), doxycycline (Severity: Moderate, Reaction: nausea, aching, itching, shaky), amlodipine (Reaction: Nausea and vomiting), Macrobid (Reaction: rash) Farino, LAURN DEMBSKI ([id number]) Family History Cancer - Siblings, Heart Disease - Father, Hypertension - Siblings, Stroke - Mother, No family history of Hereditary Spherocytosis, Kidney Disease, Lung Disease, Seizures, Thyroid Problems, Tuberculosis. Social History Never smoker, Marital Status - Married, Alcohol Use - Never, Drug Use - No History, Caffeine Use - Daily. Medical History Eyes Patient has history of Cataracts Denies history of Glaucoma, Optic Neuritis Ear/Nose/Mouth/Throat Denies history of Chronic sinus problems/congestion, Middle ear problems Hematologic/Lymphatic Patient has history of Anemia Denies history of Hemophilia, Human Immunodeficiency Virus, Lymphedema, Sickle Cell Disease Respiratory Denies history of Aspiration, Asthma, Chronic Obstructive Pulmonary Disease (COPD), Pneumothorax, Sleep Apnea, Tuberculosis Cardiovascular Patient has history of Arrhythmia - A fib, Hypertension Denies history of Angina, Congestive Heart Failure, Coronary Artery Disease, Hypotension, Myocardial Infarction, Peripheral Arterial Disease, Peripheral Venous Disease, Phlebitis, Vasculitis Gastrointestinal Denies history of Cirrhosis , Colitis, Crohn s, Hepatitis A, Hepatitis B, Hepatitis C Endocrine Denies history of Type I Diabetes, Type II Diabetes Genitourinary Denies history  of End Stage Renal Disease Immunological Denies history of Lupus Erythematosus, Raynaud s, Scleroderma Integumentary (Skin) Denies history of History of Burn, History of pressure wounds Musculoskeletal Patient has history of Osteoarthritis Denies history of Gout, Rheumatoid Arthritis Neurologic Denies history of Dementia, Neuropathy, Quadriplegia, Paraplegia, Seizure Disorder Oncologic Patient has history of Received Radiation Denies history of Received Chemotherapy Psychiatric Denies history of Anorexia/bulimia, Confinement Anxiety Hospitalization/Surgery History - 09/16/2014, [facility], (R) knee replacement. Medical And Surgical History Notes Ear/Nose/Mouth/Throat HOH- hearing aids Genitourinary DAYTON, STEPHNEY. ([id number]) frequent UTIs Oncologic Breast Ca- R mastectomy Objective Constitutional Pulse regular. Respirations normal and unlabored. Afebrile. Vitals Time Taken: 2:58 PM, Height: 68 in, Source: Stated, Weight: 150 lbs, Source: Stated, BMI: 22.8, Temperature: 97.6 F, Pulse: 51 bpm, Respiratory Rate: 20 breaths/min, Blood Pressure: 129/39 mmHg. Respiratory WNL. No retractions.. Neurological Sensation normal to touch, pin,and vibration. Psychiatric Judgement and insight Intact.. Oriented times 3.. No evidence of depression, anxiety, or agitation.. General Notes: Right anterior calf hematoma. Partially liquefied. Cellulitis involving the distal right anterior calf. 1+ pitting edema. No palpable pedal pulses per her baseline. Dopplerable DP. Right ABI 0.68. The hematoma was prepped with Betadine and opened inferiorly with a scalpel. Approximately 2 cc of liquefied hematoma was evacuated. Cultures obtained. The majority of the hematoma was consolidated and adherent. Patient tolerated well. Integumentary (Hair, Skin) Wound #10 status is Open. Original cause of wound was Trauma. The wound  is located on the Left,Midline Lower Leg. The wound measures 4.5cm length x 5cm width x 0.1cm  depth; 17.671cm^2 area and 1.767cm^3 volume. The wound is limited to skin breakdown. There is no tunneling or undermining noted. There is a none present amount of drainage noted. The wound margin is flat and intact. There is no granulation within the wound bed. There is a large (67-100%) amount of necrotic tissue within the wound bed including Eschar. The periwound skin appearance exhibited: Localized Edema, Erythema. The periwound skin appearance did not exhibit: Dry/Scaly, Maceration, Moist. The surrounding wound skin color is noted with erythema which is circumferential. Erythema is marked. DARNECIA, BEEM ([id number]) Assessment Active Problems ICD-10 S81.801A - Unspecified open wound, right lower leg, initial encounter I48.2 - Chronic atrial fibrillation L03.115 - Cellulitis of right lower limb Right calf traumatic ulceration. Cellulitis. Arterial insufficiency. Procedures Wound #10 Wound #10 is a Trauma, Other located on the Left, Midline Lower Leg . Hematoma / Seroma incision and drainage was provided by BURNS III, Teressa Senter., MD. The skin was cleansed and prepped with anti- septic followed by pain control using Other. An incision was made in the right lower leg with the following instrument(s): Blade. There was an immediate release of Serous fluid. There was no bleeding. A time out was conducted prior to the start of the procedure. Swab culture was sent. The procedure was tolerated well with a pain level of 0 throughout and a pain level of 0 following the procedure. Post procedure Diagnosis Wound #10: Same as Pre-Procedure Plan Wound Cleansing: Wound #10 Left,Midline Lower Leg: Clean wound with Normal Saline. Anesthetic: Wound #10 Left,Midline Lower Leg: Topical Lidocaine 4% cream applied to wound bed prior to debridement Primary Wound Dressing: Wound #10 Left,Midline Lower Leg: Aquacel Ag Secondary Dressing: Wound #10 Left,Midline Lower Leg: Gauze, ABD and Kerlix/Conform Gaubert,  Sania A. ([id number]) Dressing Change Frequency: Wound #10 Left,Midline Lower Leg: Change dressing every day. Follow-up Appointments: Wound #10 Left,Midline Lower Leg: Return Appointment in 1 week. Edema Control: Wound #10 Left,Midline Lower Leg: Elevate legs to the level of the heart and pump ankles as often as possible Other: - ace wrap Medications-please add to medication list.: Wound #10 Left,Midline Lower Leg: P.O. Antibiotics - Clindamycin 300mg  TID x 1 week Topical Antibiotic - Lidocaine Topical 4% apply to right leg BID x 2 weeks Laboratory ordered were: Culture and Sensitivity - from wound Clindamycin. Follow-up on swab culture obtained today. Silver alginate. Edema control with Ace wrap or Tubigrip. Frequent leg elevation. If no significant improvement will recommend follow-up with vascular for consideration of intervention. Return to clinic in 1 week. Call with any questions or concerns in the meantime. Electronic Signature(s) Signed: 05/05/2015 4:12:28 PM By: Loletha Grayer MD Entered By: Loletha Grayer on 05/05/2015 16:12:04 Russey, [name] ([id number]) -------------------------------------------------------------------------------- ROS/PFSH Details Patient Name: [name] Date of Service: 05/05/2015 2:30 PM Medical Record Patient Account Number: [id number] Number: Treating RN: Ahmed Prima 29-Mar-1933 (82 y.o. Other Clinician: Date of Birth/Sex: Female) Treating BURNS III, Primary Care Physician/Extender: Langston Masker, Elta Gu[place] Physician: Referring Physician: Melina Modena in Treatment: 0 Label Progress Note Print Version as History and Physical for this encounter Information Obtained From Patient Wound History Do you currently have one or more open woundso Yes How many open wounds do you currently haveo 1 Approximately how long have you had your woundso since friday How have you been treating your wound(s) until nowo keeping it covered Has  your wound(s) ever healed  and then re-openedo No Have you had any lab work done in the past montho No Have you tested positive for an antibiotic resistant organism (MRSA, VRE)o No Have you tested positive for osteomyelitis (bone infection)o No Have you had any tests for circulation on your legso Yes Who ordered the testo Dr. Stanford Breed Where was the test [facility] Have you had other problems associated with your woundso Swelling Eyes Medical History: Positive for: Cataracts Negative for: Glaucoma; Optic Neuritis Ear/Nose/Mouth/Throat Medical History: Negative for: Chronic sinus problems/congestion; Middle ear problems Past Medical History Notes: HOH- hearing aids Hematologic/Lymphatic Medical History: Positive for: Anemia Negative for: Hemophilia; Human Immunodeficiency Virus; Lymphedema; Sickle Cell Disease Respiratory CHANTELLA, AVARA ([id number]) Medical History: Negative for: Aspiration; Asthma; Chronic Obstructive Pulmonary Disease (COPD); Pneumothorax; Sleep Apnea; Tuberculosis Cardiovascular Medical History: Positive for: Arrhythmia - A fib; Hypertension Negative for: Angina; Congestive Heart Failure; Coronary Artery Disease; Hypotension; Myocardial Infarction; Peripheral Arterial Disease; Peripheral Venous Disease; Phlebitis; Vasculitis Gastrointestinal Medical History: Negative for: Cirrhosis ; Colitis; Crohnos; Hepatitis A; Hepatitis B; Hepatitis C Endocrine Medical History: Negative for: Type I Diabetes; Type II Diabetes Genitourinary Medical History: Negative for: End Stage Renal Disease Past Medical History Notes: frequent UTIs Immunological Medical History: Negative for: Lupus Erythematosus; Raynaudos; Scleroderma Integumentary (Skin) Medical History: Negative for: History of Burn; History of pressure wounds Musculoskeletal Medical History: Positive for: Osteoarthritis Negative for: Gout; Rheumatoid Arthritis Neurologic Medical History: Negative for:  Dementia; Neuropathy; Quadriplegia; Paraplegia; Seizure Disorder Oncologic Medical History: Positive for: Received Radiation EMMELINE, SHINE ([id number]) Negative for: Received Chemotherapy Past Medical History Notes: Breast Ca- R mastectomy Psychiatric Medical History: Negative for: Anorexia/bulimia; Confinement Anxiety HBO Extended History Items Eyes: Cataracts Immunizations Tetanus Vaccine: Last tetanus shot: 08/15/2008 Hospitalization / Surgery History Name of Hospital Purpose of Hospitalization/Surgery Date [facility] (R) knee replacement 09/16/2014 Family and Social History Cancer: Yes - Siblings; Heart Disease: Yes - Father; Hereditary Spherocytosis: No; Hypertension: Yes - Siblings; Kidney Disease: No; Lung Disease: No; Seizures: No; Stroke: Yes - Mother; Thyroid Problems: No; Tuberculosis: No; Never smoker; Marital Status - Married; Alcohol Use: Never; Drug Use: No History; Caffeine Use: Daily; Financial Concerns: No; Food, Clothing or Shelter Needs: No; Support System Lacking: No; Transportation Concerns: No; Advanced Directives: Yes (Not Provided); Patient does not want information on Advanced Directives; Do not resuscitate: No; Living Will: Yes (Not Provided); Medical Power of Attorney: Yes (Not Provided) Physician Affirmation I have reviewed and agree with the above information. Electronic Signature(s) Signed: 05/05/2015 4:12:28 PM By: Loletha Grayer MD Signed: 05/05/2015 5:35:56 PM By: Alric Quan Entered By: Loletha Grayer on 05/05/2015 16:11:44 Rumble, [name] ([id number]) -------------------------------------------------------------------------------- SuperBill Details Patient Name: [name] Date of Service: 05/05/2015 Medical Record Patient Account Number: [id number] Number: Treating RN: Ahmed Prima 1932-12-11 (82 y.o. Other Clinician: Date of Birth/Sex: Female) Treating BURNS III, Primary Care Physician/Extender: Langston Masker,  Elta Guadeloupe Physician: Suella Grove in Treatment: 0 Referring Physician: Emily Filbert Diagnosis Coding ICD-10 Codes Code Description [phone] Unspecified open wound, right lower leg, initial encounter I48.2 Chronic atrial fibrillation L03.115 Cellulitis of right lower limb Facility Procedures CPT4 Code: [id number] Description: [id number] - [facility] HEMATOMA SEROMA ICD-10 Description Diagnosis S81.801A Unspecified open wound, right lower leg, initial Modifier: encounter Quantity: 1 Physician Procedures CPT4 [id number] Description: [id number] - WC PHYS LEVEL 3 - EST PT ICD-10 Description Diagnosis S81.801A Unspecified open wound, right lower leg, initial e L03.115 Cellulitis of right lower limb Modifier: ncounter Quantity: 1 CPT4 Code: [id number] Description: [id number] - WC PHYS TX OF  [facility] HEMATOMA SEROMA ICD-10 Description Diagnosis S81.801A Unspecified open wound, right lower leg, initial e Modifier: ncounter Quantity: 1 Electronic Signature(s) Signed: 05/05/2015 4:12:28 PM By: Loletha Grayer MD Entered By: Loletha Grayer on 05/05/2015 16:11:19

## 2015-05-10 LAB — WOUND CULTURE: CULTURE: NO GROWTH

## 2015-05-12 ENCOUNTER — Encounter: Payer: PPO | Admitting: Surgery

## 2015-05-12 DIAGNOSIS — I482 Chronic atrial fibrillation: Secondary | ICD-10-CM | POA: Diagnosis not present

## 2015-05-12 DIAGNOSIS — L03115 Cellulitis of right lower limb: Secondary | ICD-10-CM | POA: Diagnosis not present

## 2015-05-12 DIAGNOSIS — S81801A Unspecified open wound, right lower leg, initial encounter: Secondary | ICD-10-CM | POA: Diagnosis not present

## 2015-05-12 NOTE — Progress Notes (Addendum)
FRANCINA, BETHEA ([id number]) Visit Report for 05/12/2015 Arrival Information Details Patient Name: [name], [name]. Date of Service: 05/12/2015 8:00 AM Medical Record Number: [id number] Patient Account Number: [id number] Date of Birth/Sex: Jun 14, 1932 (82 y.o. Female) Treating RN: Afful, RN, BSN, Velva Harman Primary Care Physician: Emily Filbert Other Clinician: Referring Physician: Emily Filbert Treating Physician/Extender: Frann Rider in Treatment: 1 Visit Information History Since Last Visit Added or deleted any medications: No Patient Arrived: [name] Any new allergies or adverse reactions: No Arrival Time: 08:05 Had a fall or experienced change in No Accompanied By: self activities of daily living that may affect Transfer Assistance: None risk of falls: Patient Identification Verified: Yes Signs or symptoms of abuse/neglect since last No Secondary Verification Process Completed: Yes visito Patient Requires Transmission-Based No Has Dressing in Place as Prescribed: Yes Precautions: Pain Present Now: No Patient Has Alerts: Yes Patient Alerts: ASA Electronic Signature(s) Signed: 05/12/2015 5:22:56 PM By: Regan Lemming BSN, RN Previous Signature: 05/12/2015 8:06:05 AM Version By: Regan Lemming BSN, RN Entered By: Regan Lemming on 05/12/2015 08:30:18 Legner, [name] ([id number]) -------------------------------------------------------------------------------- Encounter Discharge Information Details Patient Name: [name]. Date of Service: 05/12/2015 8:00 AM Medical Record Number: [id number] Patient Account Number: [id number] Date of Birth/Sex: [birth year] (82 y.o. Female) Treating RN: Baruch Gouty, RN, BSN, Velva Harman Primary Care Physician: Emily Filbert Other Clinician: Referring Physician: Emily Filbert Treating Physician/Extender: Frann Rider in Treatment: 1 Encounter Discharge Information Items Discharge Pain Level: 0 Discharge Condition: Stable Ambulatory Status: Cane Discharge Destination:  Home Transportation: Private Auto Accompanied By: self Schedule Follow-up Appointment: No Medication Reconciliation completed No and provided to Patient/Care Seneca Hoback: Provided on Clinical Summary of Care: 05/12/2015 Form Type Recipient Paper Patient JW Electronic Signature(s) Signed: 05/12/2015 5:22:56 PM By: Regan Lemming BSN, RN Previous Signature: 05/12/2015 8:29:15 AM Version By: Ruthine Dose Entered By: Regan Lemming on 05/12/2015 08:29:38 Feely, [name] ([id number]) -------------------------------------------------------------------------------- Lower Extremity Assessment Details Patient Name: [name]. Date of Service: 05/12/2015 8:00 AM Medical Record Number: [id number] Patient Account Number: [id number] Date of Birth/Sex: 19-Sep-1932 (82 y.o. Female) Treating RN: Afful, RN, BSN, Velva Harman Primary Care Physician: Emily Filbert Other Clinician: Referring Physician: Emily Filbert Treating Physician/Extender: Frann Rider in Treatment: 1 Edema Assessment Assessed: Shirlyn Goltz: No] [Right: No] Edema: [Left: Ye] [Right: s] Calf Left: Right: Point of Measurement: 34 cm From Medial Instep cm cm Ankle Left: Right: Point of Measurement: 10 cm From Medial Instep cm cm Vascular Assessment Pulses: Posterior Tibial Dorsalis Pedis Palpable: [Right:No] Doppler: [Right:Monophasic] Extremity colors, hair growth, and conditions: Extremity Color: [Right:Mottled] Hair Growth on Extremity: [Right:No] Capillary Refill: [Right:< 3 seconds] Toe Nail Assessment Left: Right: Thick: No Discolored: No Deformed: No Improper Length and Hygiene: No Electronic Signature(s) Signed: 05/12/2015 5:22:56 PM By: Regan Lemming BSN, RN Previous Signature: 05/12/2015 8:06:37 AM Version By: Regan Lemming BSN, RN Entered By: Regan Lemming on 05/12/2015 08:30:29 Alles, [name] ([id number]) Anguiano, [name] ([id number]) -------------------------------------------------------------------------------- Multi Wound Chart  Details Patient Name: [name]. Date of Service: 05/12/2015 8:00 AM Medical Record Number: [id number] Patient Account Number: [id number] Date of Birth/Sex: March 25, 1933 (82 y.o. Female) Treating RN: Baruch Gouty, RN, BSN, Velva Harman Primary Care Physician: Emily Filbert Other Clinician: Referring Physician: Emily Filbert Treating Physician/Extender: Frann Rider in Treatment: 1 Vital Signs Height(in): 68 Pulse(bpm): 53 Weight(lbs): 150 Blood Pressure 170/48 (mmHg): Body Mass Index(BMI): 23 Temperature(F): 97.6 Respiratory Rate 18 (breaths/min): Photos: [10:No Photos] [N/A:N/A] Wound Location: [10:Left Lower Leg - Midline N/A] Wounding Event: [10:Trauma] [N/A:N/A] Primary Etiology: [10:Trauma, Other] [N/A:N/A] Comorbid  History: [10:Cataracts, Anemia, Arrhythmia, Hypertension, Osteoarthritis, Received Radiation] [N/A:N/A] Date Acquired: [10:04/30/2015] [N/A:N/A] Weeks of Treatment: [10:1] [N/A:N/A] Wound Status: [10:Open] [N/A:N/A] Measurements L x W x D 4x4.3x0.1 [N/A:N/A] (cm) Area (cm) : [10:13.509] [N/A:N/A] Volume (cm) : [10:1.351] [N/A:N/A] % Reduction in Area: [10:23.60%] [N/A:N/A] % Reduction in Volume: 23.50% [N/A:N/A] Classification: [10:Partial Thickness] [N/A:N/A] Exudate Amount: [10:None Present] [N/A:N/A] Wound Margin: [10:Flat and Intact] [N/A:N/A] Granulation Amount: [10:None Present (0%)] [N/A:N/A] Necrotic Amount: [10:Large (67-100%)] [N/A:N/A] Necrotic Tissue: [10:Eschar] [N/A:N/A] Exposed Structures: [10:Fascia: No Fat: No Tendon: No Muscle: No Joint: No Bone: No] [N/A:N/A] Limited to Skin Breakdown Epithelialization: None N/A N/A Periwound Skin Texture: Edema: Yes N/A N/A Periwound Skin Dry/Scaly: Yes N/A N/A Moisture: Maceration: No Moist: No Periwound Skin Color: Erythema: Yes N/A N/A Erythema Location: Circumferential N/A N/A Erythema Measurement: Marked N/A N/A Erythema Change: No Change N/A N/A Temperature: No Abnormality N/A N/A Tenderness  on Yes N/A N/A Palpation: Wound Preparation: Ulcer Cleansing: N/A N/A Rinsed/Irrigated with Saline Topical Anesthetic Applied: Other: lidocaine 4% Treatment Notes Electronic Signature(s) Signed: 05/12/2015 5:22:56 PM By: Regan Lemming BSN, RN Entered By: Regan Lemming on 05/12/2015 08:20:24 [name] ([id number]) -------------------------------------------------------------------------------- [place] Details Patient Name: [name]. Date of Service: 05/12/2015 8:00 AM Medical Record Number: [id number] Patient Account Number: [id number] Date of Birth/Sex: Aug 15, 1932 (82 y.o. Female) Treating RN: Afful, RN, BSN, Velva Harman Primary Care Physician: Emily Filbert Other Clinician: Referring Physician: Emily Filbert Treating Physician/Extender: Frann Rider in Treatment: 1 Active Inactive Electronic Signature(s) Signed: 05/12/2015 5:22:56 PM By: Regan Lemming BSN, RN Entered By: Regan Lemming on 05/12/2015 08:19:33 Mcpherson, [name] ([id number]) -------------------------------------------------------------------------------- Pain Assessment Details Patient Name: [name]. Date of Service: 05/12/2015 8:00 AM Medical Record Number: [id number] Patient Account Number: [id number] Date of Birth/Sex: [birth year] (82 y.o. Female) Treating RN: Baruch Gouty, RN, BSN, Velva Harman Primary Care Physician: Emily Filbert Other Clinician: Referring Physician: Emily Filbert Treating Physician/Extender: Frann Rider in Treatment: 1 Active Problems Location of Pain Severity and Description of Pain Patient Has Paino No Site Locations Pain Management and Medication Current Pain Management: Electronic Signature(s) Signed: 05/12/2015 5:22:56 PM By: Regan Lemming BSN, RN Entered By: Regan Lemming on 05/12/2015 08:09:41 Follmer, [name] ([id number]) -------------------------------------------------------------------------------- Patient/Caregiver Education Details Patient Name: [name] Date of Service:  05/12/2015 8:00 AM Medical Record Number: [id number] Patient Account Number: [id number] Date of Birth/Gender: 04-01-1933 (82 y.o. Female) Treating RN: Baruch Gouty, RN, BSN, Velva Harman Primary Care Physician: Emily Filbert Other Clinician: Referring Physician: Emily Filbert Treating Physician/Extender: Frann Rider in Treatment: 1 Education Assessment Education Provided To: Patient Education Topics Provided Basic Hygiene: Methods: Explain/Verbal Responses: State content correctly Wound Debridement: Methods: Explain/Verbal Responses: State content correctly Wound/Skin Impairment: Methods: Explain/Verbal Responses: State content correctly Electronic Signature(s) Signed: 05/12/2015 5:22:56 PM By: Regan Lemming BSN, RN Entered By: Regan Lemming on 05/12/2015 08:30:02 Mifsud, [name] ([id number]) -------------------------------------------------------------------------------- Wound Assessment Details Patient Name: [name]. Date of Service: 05/12/2015 8:00 AM Medical Record Number: [id number] Patient Account Number: [id number] Date of Birth/Sex: [birth year] (82 y.o. Female) Treating RN: Baruch Gouty, RN, BSN, Velva Harman Primary Care Physician: Emily Filbert Other Clinician: Referring Physician: Emily Filbert Treating Physician/Extender: Frann Rider in Treatment: 1 Wound Status Wound Number: 10 Primary Trauma, Other Etiology: Wound Location: Left Lower Leg - Midline Wound Open Wounding Event: Trauma Status: Date Acquired: 04/30/2015 Comorbid Cataracts, Anemia, Arrhythmia, Weeks Of Treatment: 1 History: Hypertension, Osteoarthritis, Received Clustered Wound: No Radiation Photos Photo Uploaded By: Regan Lemming on 05/12/2015 16:22:52 Wound Measurements Length: (cm) 4 Width: (  cm) 4.3 Depth: (cm) 0.1 Area: (cm) 13.509 Volume: (cm) 1.351 % Reduction in Area: 23.6% % Reduction in Volume: 23.5% Epithelialization: None Tunneling: No Undermining: No Wound Description Classification: Partial  Thickness Wound Margin: Flat and Intact Exudate Amount: None Present Foul Odor After Cleansing: No Wound Bed Granulation Amount: None Present (0%) Exposed Structure Necrotic Amount: Large (67-100%) Fascia Exposed: No Necrotic Quality: Eschar Fat Layer Exposed: No Tendon Exposed: No Muscle Exposed: No Joint Exposed: No Dakin, Adrianne A. ([id number]) Bone Exposed: No Limited to Skin Breakdown Periwound Skin Texture Texture Color No Abnormalities Noted: No No Abnormalities Noted: No Localized Edema: Yes Erythema: Yes Erythema Location: Circumferential Moisture Erythema Measurement: Marked No Abnormalities Noted: No Erythema Change: No Change Dry / Scaly: Yes Maceration: No Temperature / Pain Moist: No Temperature: No Abnormality Tenderness on Palpation: Yes Wound Preparation Ulcer Cleansing: Rinsed/Irrigated with Saline Topical Anesthetic Applied: Other: lidocaine 4%, Treatment Notes Wound #10 (Left, Midline Lower Leg) 1. Cleansed with: Clean wound with Normal Saline 4. Dressing Applied: Aquacel Ag 5. Secondary Dressing Applied Bordered Foam Dressing Electronic Signature(s) Signed: 05/12/2015 5:22:56 PM By: Regan Lemming BSN, RN Entered By: Regan Lemming on 05/12/2015 08:14:18 Prindle, [name] ([id number]) -------------------------------------------------------------------------------- Vitals Details Patient Name: [name]. Date of Service: 05/12/2015 8:00 AM Medical Record Number: [id number] Patient Account Number: [id number] Date of Birth/Sex: July 08, 1932 (82 y.o. Female) Treating RN: Afful, RN, BSN, [place] Primary Care Physician: Emily Filbert Other Clinician: Referring Physician: Emily Filbert Treating Physician/Extender: Frann Rider in Treatment: 1 Vital Signs Time Taken: 08:13 Temperature (F): 97.6 Height (in): 68 Pulse (bpm): 53 Weight (lbs): 150 Respiratory Rate (breaths/min): 18 Body Mass Index (BMI): 22.8 Blood Pressure (mmHg): 170/48 Reference Range: 80  - 120 mg / dl Electronic Signature(s) Signed: 05/12/2015 5:22:56 PM By: Regan Lemming BSN, RN Entered By: Regan Lemming on 05/12/2015 08:13:42

## 2015-05-13 NOTE — Progress Notes (Signed)
IVET, SZYMBORSKI ([id number]) Visit Report for 05/12/2015 Chief Complaint Document Details Patient Name: [name], [name]. Date of Service: 05/12/2015 8:00 AM Medical Record Patient Account Number: [id number] Number: Afful, RN, BSN, Treating RN: [birth year] (82 y.o. [name] Date of Birth/Sex: Female) Other Clinician: Primary Care Physician: Emily Filbert Treating Christin Fudge Referring Physician: Emily Filbert Physician/Extender: Suella Grove in Treatment: 1 Information Obtained from: Patient Chief Complaint Right anterior calf traumatic hematoma. Cellulitis. Electronic Signature(s) Signed: 05/12/2015 8:27:52 AM By: Christin Fudge MD, FACS Entered By: Christin Fudge on 05/12/2015 08:27:52 Rudell, [name] ([id number]) -------------------------------------------------------------------------------- Debridement Details Patient Name: [name]. Date of Service: 05/12/2015 8:00 AM Medical Record Patient Account Number: [id number] Number: Afful, RN, BSN, Treating RN: 04/21/1932 (82 y.o. [name] Date of Birth/Sex: Female) Other Clinician: Primary Care Physician: Emily Filbert Treating Christin Fudge Referring Physician: Emily Filbert Physician/Extender: Suella Grove in Treatment: 1 Debridement Performed for Wound #10 Left,Midline Lower Leg Assessment: Performed By: Physician Christin Fudge, MD Debridement: Debridement Pre-procedure Yes Verification/Time Out Taken: Start Time: 08:20 Pain Control: Lidocaine 4% Topical Solution Level: Skin/Subcutaneous Tissue Total Area Debrided (L x 4 (cm) x 4.3 (cm) = 17.2 (cm) W): Tissue and other Viable, Non-Viable, Blood Clots, Eschar, Fibrin/Slough, Skin, Subcutaneous material debrided: Instrument: Forceps, Scissors Bleeding: Minimum Hemostasis Achieved: Pressure End Time: 08:25 Procedural Pain: 0 Post Procedural Pain: 0 Response to Treatment: Procedure was tolerated well Post Debridement Measurements of Total Wound Length: (cm) 4 Width: (cm)  4.3 Depth: (cm) 0.4 Volume: (cm) 5.404 Post Procedure Diagnosis Same as Pre-procedure Notes the skin over the hematoma was completely necrotic and there was a boggy hematoma which was in need of evacuation. Sharp dissection was done with a forcep and scissors and the hematoma evacuated and some of the subcutaneous tissue debrided with scissors and forceps. Washed out profusely with saline. no signs of inflammation Electronic Signature(s) ASHAYLA, LATVALA ([id number]) Signed: 05/12/2015 8:27:44 AM By: Christin Fudge MD, FACS Signed: 05/12/2015 5:22:56 PM By: Regan Lemming BSN, RN Entered By: Christin Fudge on 05/12/2015 08:27:44 Juarez, [name] ([id number]) -------------------------------------------------------------------------------- HPI Details Patient Name: [name]. Date of Service: 05/12/2015 8:00 AM Medical Record Patient Account Number: [id number] Number: Afful, RN, BSN, Treating RN: [birth year] (82 y.o. [name] Date of Birth/Sex: Female) Other Clinician: Primary Care Physician: Emily Filbert Treating Christin Fudge Referring Physician: Emily Filbert Physician/Extender: Suella Grove in Treatment: 1 History of Present Illness HPI Description: Very pleasant 82 year old with history of atrial fibrillation and peripheral vascular disease. 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 Healed as of November 2016. Seen by her cardiologist Dr. Kathlyn [place] in April 2016. Noninvasive vascular evaluation showed an ABI of 0.68 on the right and 0.85 on the left. 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 One-vessel runoff below the knee via the peroneal artery with short occlusion of the anterior tibial artery and reconstitution. No intervention performed. She hit her leg on a wooden duck 2 days ago and noted immediate swelling. This has not  significantly increased in size but has become "softer". No drainage. Mild pain. Ambulating per her baseline. No significant claudication. No antibiotics. No fever or chills. No drainage. Unable to apply her compression stockings. 05/12/2015 -- the culture done last week -- no growth was found. patient already on clindamycin and will complete her course. Electronic Signature(s) Signed: 05/12/2015 8:28:29 AM By: Christin Fudge MD, FACS Entered By: Con Memos  Kambre Messner on 05/12/2015 [id number] VIRGILENE, LOURA ([id number]) -------------------------------------------------------------------------------- Physical Exam Details Patient Name: [name], Latoya. Date of Service: 05/12/2015 8:00 AM Medical Record Patient Account Number: [id number] Number: Afful, RN, BSN, Treating RN: 1933/03/21 (82 y.o. [name] Date of Birth/Sex: Female) Other Clinician: Primary Care Physician: Emily Filbert Treating Christin Fudge Referring Physician: Emily Filbert Physician/Extender: Suella Grove in Treatment: 1 Constitutional . Pulse regular. Respirations normal and unlabored. Afebrile. . Eyes Nonicteric. Reactive to light. Ears, Nose, Mouth, and Throat Lips, teeth, and gums WNL.Marland Kitchen Moist mucosa without lesions. Neck supple and nontender. No palpable supraclavicular or cervical adenopathy. Normal sized without goiter. Respiratory WNL. No retractions.. . Cardiovascular . Pedal Pulses WNL. has minimal edema lower extremities which is her baseline. Chest Breasts symmetical and no nipple discharge.. Breast tissue WNL, no masses, lumps, or tenderness.. Lymphatic No adneopathy. No adenopathy. No adenopathy. Musculoskeletal Adexa without tenderness or enlargement.. Digits and nails w/o clubbing, cyanosis, infection, petechiae, ischemia, or inflammatory conditions.. Integumentary (Hair, Skin) No suspicious lesions. No crepitus or fluctuance. No peri-wound warmth or erythema. No masses.Marland Kitchen Psychiatric Judgement and insight Intact..  No evidence of depression, anxiety, or agitation.. Notes the skin over the hematoma was completely necrotic and there was a boggy hematoma which was in need of evacuation. Sharp dissection was done with a forcep and scissors and the hematoma evacuated and some of the subcutaneous tissue debrided with scissors and forceps. Washed out profusely with saline. no signs of inflammation. Electronic Signature(s) Signed: 05/12/2015 8:29:08 AM By: Christin Fudge MD, FACS Entered By: Christin Fudge on 05/12/2015 08:29:08 Koloski, [name] ([id number]) Leonides Schanz, [name] ([id number]) -------------------------------------------------------------------------------- Physician Orders Details Patient Name: [name]. Date of Service: 05/12/2015 8:00 AM Medical Record Patient Account Number: [id number] Number: Afful, RN, BSN, Treating RN: 1932-12-26 (82 y.o. [name] Date of Birth/Sex: Female) Other Clinician: Primary Care Physician: Emily Filbert Treating Christin Fudge Referring Physician: Emily Filbert Physician/Extender: Suella Grove in Treatment: 1 Verbal / Phone Orders: Yes Clinician: Afful, RN, BSN, Rita Read Back and Verified: Yes Diagnosis Coding Wound Cleansing Wound #10 Left,Midline Lower Leg o Cleanse wound with mild soap and water o May Shower, gently pat wound dry prior to applying new dressing. o May shower with protection. Anesthetic Wound #10 Left,Midline Lower Leg o Topical Lidocaine 4% cream applied to wound bed prior to debridement Primary Wound Dressing Wound #10 Left,Midline Lower Leg o Aquacel Ag Secondary Dressing Wound #10 Left,Midline Lower Leg o Boardered Foam Dressing Dressing Change Frequency Wound #10 Left,Midline Lower Leg o Change dressing every other day. Follow-up Appointments Wound #10 Left,Midline Lower Leg o Return Appointment in 1 week. Electronic Signature(s) Signed: 05/12/2015 4:12:51 PM By: Christin Fudge MD, FACS Signed: 05/12/2015 5:22:56 PM By:  Regan Lemming BSN, RN Entered By: Regan Lemming on 05/12/2015 08:26:11 Gaskill, [name] ([id number]) -------------------------------------------------------------------------------- Problem List Details Patient Name: MARABELLA, FOO A. Date of Service: 05/12/2015 8:00 AM Medical Record Patient Account Number: [id number] Number: Afful, RN, BSN, Treating RN: 1933/02/15 (82 y.o. [name] Date of Birth/Sex: Female) Other Clinician: Primary Care Physician: Emily Filbert Treating Christin Fudge Referring Physician: Emily Filbert Physician/Extender: Suella Grove in Treatment: 1 Active Problems ICD-10 Encounter Code Description Active Date Diagnosis S81.801A Unspecified open wound, right lower leg, initial encounter 05/05/2015 Yes I48.2 Chronic atrial fibrillation 05/05/2015 Yes L03.115 Cellulitis of right lower limb 05/05/2015 Yes Inactive Problems Resolved Problems Electronic Signature(s) Signed: 05/12/2015 8:26:18 AM By: Christin Fudge MD, FACS Entered By: Christin Fudge on 05/12/2015 08:26:18 Lobban, [name] ([id number]) -------------------------------------------------------------------------------- Progress Note Details Patient Name: Servellon,  [name] Date of Service: 05/12/2015 8:00 AM Medical Record Patient Account Number: [id number] Number: Afful, RN, BSN, Treating RN: [birth year] (82 y.o. [name] Date of Birth/Sex: Female) Other Clinician: Primary Care Physician: Emily Filbert Treating Christin Fudge Referring Physician: Emily Filbert Physician/Extender: Suella Grove in Treatment: 1 Subjective Chief Complaint Information obtained from Patient Right anterior calf traumatic hematoma. Cellulitis. History of Present Illness (HPI) Very pleasant 82 year old with history of atrial fibrillation and peripheral vascular disease. 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 Healed as of November 2016. Seen by her cardiologist Dr.  Kathlyn Sacramento in April 2016. Noninvasive vascular evaluation showed an ABI of 0.68 on the right and 0.85 on the left. 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 One-vessel runoff below the knee via the peroneal artery with short occlusion of the anterior tibial artery and reconstitution. No intervention performed. She hit her leg on a wooden duck 2 days ago and noted immediate swelling. This has not significantly increased in size but has become "softer". No drainage. Mild pain. Ambulating per her baseline. No significant claudication. No antibiotics. No fever or chills. No drainage. Unable to apply her compression stockings. 05/12/2015 -- the culture done last week -- no growth was found. patient already on clindamycin and will complete her course. Objective Constitutional Pulse regular. Respirations normal and unlabored. Afebrile. Vitals Time Taken: 8:13 AM, Height: 68 in, Weight: 150 lbs, BMI: 22.8, Temperature: 97.6 F, Pulse: 53 bpm, Respiratory Rate: 18 breaths/min, Blood Pressure: 170/48 mmHg. Eyes Nonicteric. Reactive to light. KEIDY, MCKESSON ([id number]) Ears, Nose, Mouth, and Throat Lips, teeth, and gums WNL.Marland Kitchen Moist mucosa without lesions. Neck supple and nontender. No palpable supraclavicular or cervical adenopathy. Normal sized without goiter. Respiratory WNL. No retractions.. Cardiovascular Pedal Pulses WNL. has minimal edema lower extremities which is her baseline. Chest Breasts symmetical and no nipple discharge.. Breast tissue WNL, no masses, lumps, or tenderness.. Lymphatic No adneopathy. No adenopathy. No adenopathy. Musculoskeletal Adexa without tenderness or enlargement.. Digits and nails w/o clubbing, cyanosis, infection, petechiae, ischemia, or inflammatory conditions.Marland Kitchen Psychiatric Judgement and insight Intact.. No evidence of depression, anxiety, or agitation.. General Notes: the skin  over the hematoma was completely necrotic and there was a boggy hematoma which was in need of evacuation. Sharp dissection was done with a forcep and scissors and the hematoma evacuated and some of the subcutaneous tissue debrided with scissors and forceps. Washed out profusely with saline. no signs of inflammation. Integumentary (Hair, Skin) No suspicious lesions. No crepitus or fluctuance. No peri-wound warmth or erythema. No masses.. Wound #10 status is Open. Original cause of wound was Trauma. The wound is located on the Left,Midline Lower Leg. The wound measures 4cm length x 4.3cm width x 0.1cm depth; 13.509cm^2 area and 1.351cm^3 volume. The wound is limited to skin breakdown. There is no tunneling or undermining noted. There is a none present amount of drainage noted. The wound margin is flat and intact. There is no granulation within the wound bed. There is a large (67-100%) amount of necrotic tissue within the wound bed including Eschar. The periwound skin appearance exhibited: Localized Edema, Dry/Scaly, Erythema. The periwound skin appearance did not exhibit: Maceration, Moist. The surrounding wound skin color is noted with erythema which is circumferential. Erythema is marked. Periwound temperature was noted as No Abnormality. The periwound has tenderness on palpation. Assessment THOMASENE, KURZAWA ([id number]) Active Problems ICD-10 [phone] -  Unspecified open wound, right lower leg, initial encounter I48.2 - Chronic atrial fibrillation L03.115 - Cellulitis of right lower limb After evacuating the hematoma I have recommended silver alginate to be change every other day and light Kerlix bandage to be applied over this. Since her history of previous peripheral vascular disease is being followed up by a cardiologist she will rebook an appointment at a later date. See [place] back next week. Procedures Wound #10 Wound #10 is a Trauma, Other located on the Left,Midline Lower Leg . There was a  Skin/Subcutaneous Tissue Debridement [id number]) debridement with total area of 17.2 sq cm performed by Christin Fudge, MD. with the following instrument(s): Forceps and Scissors to remove Viable and Non-Viable tissue/material including Blood Clots, Fibrin/Slough, Eschar, Skin, and Subcutaneous after achieving pain control using Lidocaine 4% Topical Solution. A time out was conducted prior to the start of the procedure. A Minimum amount of bleeding was controlled with Pressure. The procedure was tolerated well with a pain level of 0 throughout and a pain level of 0 following the procedure. Post Debridement Measurements: 4cm length x 4.3cm width x 0.4cm depth; 5.404cm^3 volume. Post procedure Diagnosis Wound #10: Same as Pre-Procedure General Notes: the skin over the hematoma was completely necrotic and there was a boggy hematoma which was in need of evacuation. Sharp dissection was done with a forcep and scissors and the hematoma evacuated and some of the subcutaneous tissue debrided with scissors and forceps. Washed out profusely with saline. no signs of inflammation. Plan Wound Cleansing: Wound #10 Left,Midline Lower Leg: Cleanse wound with mild soap and water May Shower, gently pat wound dry prior to applying new dressing. May shower with protection. Anesthetic: Wound #10 Left,Midline Lower Leg: Topical Lidocaine 4% cream applied to wound bed prior to debridement Rosch, Rada A. ([id number]) Primary Wound Dressing: Wound #10 Left,Midline Lower Leg: Aquacel Ag Secondary Dressing: Wound #10 Left,Midline Lower Leg: Boardered Foam Dressing Dressing Change Frequency: Wound #10 Left,Midline Lower Leg: Change dressing every other day. Follow-up Appointments: Wound #10 Left,Midline Lower Leg: Return Appointment in 1 week. After evacuating the hematoma I have recommended silver alginate to be change every other day and light Kerlix bandage to be applied over this. Since her history of  previous peripheral vascular disease is being followed up by a cardiologist she will rebook an appointment at a later date. See [place] back next week. Electronic Signature(s) Signed: 05/12/2015 8:30:10 AM By: Christin Fudge MD, FACS Entered By: Christin Fudge on 05/12/2015 08:30:10 Kurkowski, [name] ([id number]) -------------------------------------------------------------------------------- SuperBill Details Patient Name: [name]. Date of Service: 05/12/2015 Medical Record Patient Account Number: [id number] Number: Afful, RN, BSN, Treating RN: 28-Mar-1933 (82 y.o. [name] Date of Birth/Sex: Female) Other Clinician: Primary Care Physician: Emily Filbert Treating Christin Fudge Referring Physician: Emily Filbert Physician/Extender: Suella Grove in Treatment: 1 Diagnosis Coding ICD-10 Codes Code Description [phone] Unspecified open wound, right lower leg, initial encounter I48.2 Chronic atrial fibrillation L03.115 Cellulitis of right lower limb Facility Procedures CPT4 Code: [id number] Description: [id number] - DEB SUBQ TISSUE 20 SQ CM/< ICD-10 Description Diagnosis S81.801A Unspecified open wound, right lower leg, initial I48.2 Chronic atrial fibrillation L03.115 Cellulitis of right lower limb Modifier: encounter Quantity: 1 Physician Procedures CPT4 Code: [id number] Description: [id number] - WC PHYS SUBQ TISS 20 SQ CM ICD-10 Description Diagnosis S81.801A Unspecified open wound, right lower leg, initial I48.2 Chronic atrial fibrillation L03.115 Cellulitis of right lower limb Modifier: encounter Quantity: 1 Electronic Signature(s) Signed: 05/12/2015 8:30:22 AM By: Christin Fudge MD, FACS Entered By: Con Memos  Kimla Furth on 05/12/2015 08:30:22

## 2015-05-19 ENCOUNTER — Encounter: Payer: PPO | Attending: Internal Medicine | Admitting: Internal Medicine

## 2015-05-19 DIAGNOSIS — X58XXXA Exposure to other specified factors, initial encounter: Secondary | ICD-10-CM | POA: Diagnosis not present

## 2015-05-19 DIAGNOSIS — L03115 Cellulitis of right lower limb: Secondary | ICD-10-CM | POA: Diagnosis not present

## 2015-05-19 DIAGNOSIS — I739 Peripheral vascular disease, unspecified: Secondary | ICD-10-CM | POA: Diagnosis not present

## 2015-05-19 DIAGNOSIS — I482 Chronic atrial fibrillation: Secondary | ICD-10-CM | POA: Insufficient documentation

## 2015-05-19 DIAGNOSIS — S81801A Unspecified open wound, right lower leg, initial encounter: Secondary | ICD-10-CM | POA: Insufficient documentation

## 2015-05-21 NOTE — Progress Notes (Signed)
MCKYNZEE, ROHER ([id number]) Visit Report for 05/19/2015 Chief Complaint Document Details Patient Name: CANDUS, STEAGALL. Date of Service: 05/19/2015 12:45 PM Medical Record Patient Account Number: [id number] Number: Treating RN: Bernardina, Shabbir 1932-08-22 (82 y.o. Other Clinician: Date of Birth/Sex: Female) Treating Ladislaus Repsher Primary Care Physician/Extender: Claudette Laws Physician: Referring Physician: Melina Modena in Treatment: 2 Information Obtained from: Patient Chief Complaint Right anterior calf traumatic hematoma. Cellulitis. Electronic Signature(s) Signed: 05/19/2015 4:36:57 PM By: Linton Ham MD Entered By: Linton Ham on 05/19/2015 13:57:43 Wolfrey, [name] ([id number]) -------------------------------------------------------------------------------- Debridement Details Patient Name: [name] Date of Service: 05/19/2015 12:45 PM Medical Record Patient Account Number: [id number] Number: Treating RN: Merlie, Mouse 11-16-1932 (82 y.o. Other Clinician: Date of Birth/Sex: Female) Treating Saharah Sherrow Primary Care Physician/Extender: Claudette Laws Physician: Referring Physician: Melina Modena in Treatment: 2 Debridement Performed for Wound #10 Left,Midline Lower Leg Assessment: Performed By: Physician Ricard Dillon, MD Debridement: Debridement Pre-procedure Yes Verification/Time Out Taken: Start Time: 13:48 Pain Control: Lidocaine 4% Topical Solution Level: Skin/Subcutaneous Tissue Total Area Debrided (L x 3.6 (cm) x 3.1 (cm) = 11.16 (cm) W): Tissue and other Viable, Non-Viable, Blood Clots, Eschar, Fibrin/Slough, Subcutaneous material debrided: Instrument: Curette Bleeding: Minimum Hemostasis Achieved: Pressure End Time: 13:50 Procedural Pain: 0 Post Procedural Pain: 0 Response to Treatment: Procedure was tolerated well Post Debridement Measurements of Total Wound Length: (cm) 3.6 Width: (cm) 3.1 Depth: (cm)  0.1 Volume: (cm) 0.877 Post Procedure Diagnosis Same as Pre-procedure Electronic Signature(s) Signed: 05/19/2015 4:36:57 PM By: Linton Ham MD Signed: 05/19/2015 5:13:21 PM By: Montey Hora Entered By: Linton Ham on 05/19/2015 13:57:31 Peckenpaugh, [name] ([id number]) Rother, [name] ([id number]) -------------------------------------------------------------------------------- HPI Details Patient Name: [name] Date of Service: 05/19/2015 12:45 PM Medical Record Patient Account Number: [id number] Number: Treating RN: Areion, Protzman [birth year] (82 y.o. Other Clinician: Date of Birth/Sex: Female) Treating Evanie Buckle Primary Care Physician/Extender: Claudette Laws Physician: Referring Physician: Melina Modena in Treatment: 2 History of Present Illness HPI Description: Very pleasant 82 year old with history of atrial fibrillation and peripheral vascular disease. 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 Healed as of November 2016. Seen by her cardiologist Dr. Kathlyn Sacramento in April 2016. Noninvasive vascular evaluation showed an ABI of 0.68 on the right and 0.85 on the left. 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 One-vessel runoff below the knee via the peroneal artery with short occlusion of the anterior tibial artery and reconstitution. No intervention performed. She hit her leg on a wooden duck 2 days ago and noted immediate swelling. This has not significantly increased in size but has become "softer". No drainage. Mild pain. Ambulating per her baseline. No significant claudication. No antibiotics. No fever or chills. No drainage. Unable to apply her compression stockings. 05/12/2015 -- the culture done last week -- no growth was found. patient already on clindamycin and will complete her course. 05/19/15;  the patient is on antibiotics prescribed by her primary physician. This is a wound that was caused by traumatizing her right leg on a wooden duck. She had a hematoma which is since been evacuated. She is using Aquacel Ag and foam but no compression. Her treatment nurse tells me today that there is a reason for the noncompression, apparently the patient did not tolerate them well in the past for reasons that are not  clear. She does have venous insufficiency/stasis physiology Electronic Signature(s) Signed: 05/19/2015 4:36:57 PM By: Linton Ham MD Entered By: Linton Ham on 05/19/2015 13:59:23 Riendeau, [name] ([id number]) -------------------------------------------------------------------------------- Physical Exam Details Patient Name: [name] Date of Service: 05/19/2015 12:45 PM Medical Record Patient Account Number: [id number] Number: Treating RN: Janis, Devincentis [birth year] (82 y.o. Other Clinician: Date of Birth/Sex: Female) Treating Julies Carmickle Primary Care Physician/Extender: Claudette Laws Physician: Referring Physician: Melina Modena in Treatment: 2 Notes Wound exam; the areas on the right anterior lateral calf. I note that this was evacuated last week. She may have residual hematoma just underneath this. There is some erythema but no overt cellulitis. The patient has an adhesive over her lateral malleolus and apparently has a small chronic wound here but did not want me to look at this therefore I honored her wishes. The edema control is not very good in this leg. Her peripheral pulses are palpable but certainly not robust Electronic Signature(s) Signed: 05/19/2015 4:36:57 PM By: Linton Ham MD Entered By: Linton Ham on 05/19/2015 14:00:41 Guettler, [name] ([id number]) -------------------------------------------------------------------------------- Physician Orders Details Patient Name: [name] Date of Service: 05/19/2015 12:45 PM Medical Record  Patient Account Number: [id number] Number: Treating RN: Joann, Garnand 08/26/1932 (82 y.o. Other Clinician: Date of Birth/Sex: Female) Treating Adna Nofziger Primary Care Physician/Extender: Claudette Laws Physician: Referring Physician: Melina Modena in Treatment: 2 Verbal / Phone Orders: Yes Clinician: Montey Hora Read Back and Verified: Yes Diagnosis Coding Wound Cleansing Wound #10 Left,Midline Lower Leg o Cleanse wound with mild soap and water o May Shower, gently pat wound dry prior to applying new dressing. o May shower with protection. Anesthetic Wound #10 Left,Midline Lower Leg o Topical Lidocaine 4% cream applied to wound bed prior to debridement Primary Wound Dressing Wound #10 Left,Midline Lower Leg o Aquacel Ag Secondary Dressing Wound #10 Left,Midline Lower Leg o Boardered Foam Dressing Dressing Change Frequency Wound #10 Left,Midline Lower Leg o Change dressing every other day. Follow-up Appointments Wound #10 Left,Midline Lower Leg o Return Appointment in 1 week. Edema Control Wound #10 Left,Midline Lower Leg o Other: - ace wrap on in the morning and off before bed Electronic Signature(s) TRAMAINE, COPENHAVER ([id number]) Signed: 05/19/2015 4:36:57 PM By: Linton Ham MD Signed: 05/19/2015 5:13:21 PM By: Montey Hora Entered By: Montey Hora on 05/19/2015 13:53:04 Candler, [name] ([id number]) -------------------------------------------------------------------------------- Problem List Details Patient Name: [name] Date of Service: 05/19/2015 12:45 PM Medical Record Patient Account Number: [id number] Number: Treating RN: Lawsyn, Magadan [birth year] (82 y.o. Other Clinician: Date of Birth/Sex: Female) Treating Dequandre Cordova Primary Care Physician/Extender: Claudette Laws Physician: Referring Physician: Melina Modena in Treatment: 2 Active Problems ICD-10 Encounter Code Description Active  Date Diagnosis S81.801A Unspecified open wound, right lower leg, initial encounter 05/05/2015 Yes I48.2 Chronic atrial fibrillation 05/05/2015 Yes L03.115 Cellulitis of right lower limb 05/05/2015 Yes Inactive Problems Resolved Problems Electronic Signature(s) Signed: 05/19/2015 4:36:57 PM By: Linton Ham MD Entered By: Linton Ham on 05/19/2015 13:57:19 Arvidson, [name] ([id number]) -------------------------------------------------------------------------------- Progress Note Details Patient Name: [name] Date of Service: 05/19/2015 12:45 PM Medical Record Patient Account Number: [id number] Number: Treating RN: Lacandice, Goga 1933-01-11 (82 y.o. Other Clinician: Date of Birth/Sex: Female) Treating Victor Granados Primary Care Physician/Extender: Claudette Laws Physician: Referring Physician: Melina Modena in Treatment: 2 Plan #1 I continued the Aquacel Ag and foam covering. #2 we are going to show her friend how to do an Ace  wrap. This can be changed every second day #3 the area on the surface of the room was debridement slough and nonviable tissue. This appears to be reasonably healthy. #4 along with changing the dressing of the stalls I would like to put her in compression if she will allow it Electronic Signature(s) Signed: 05/19/2015 4:36:57 PM By: Linton Ham MD Entered By: Linton Ham on 05/19/2015 14:01:43 Veiga, [name] ([id number]) -------------------------------------------------------------------------------- SuperBill Details Patient Name: [name] Date of Service: 05/19/2015 Medical Record Patient Account Number: [id number] Number: Treating RN: Loran, Rigoli 11-23-1932 (82 y.o. Other Clinician: Date of Birth/Sex: Female) Treating Cayde Held Primary Care Physician/Extender: Claudette Laws Physician: Suella Grove in Treatment: 2 Referring Physician: Emily Filbert Diagnosis Coding ICD-10 Codes Code Description [phone]  Unspecified open wound, right lower leg, initial encounter I48.2 Chronic atrial fibrillation L03.115 Cellulitis of right lower limb Facility Procedures CPT4 Code: [id number] Description: [id number] - DEB SUBQ TISSUE 20 SQ CM/< ICD-10 Description Diagnosis S81.801A Unspecified open wound, right lower leg, initial Modifier: encounter Quantity: 1 Physician Procedures CPT4 Code: [id number] Description: [id number] - WC PHYS SUBQ TISS 20 SQ CM ICD-10 Description Diagnosis S81.801A Unspecified open wound, right lower leg, initial Modifier: encounter Quantity: 1 Electronic Signature(s) Signed: 05/19/2015 4:36:57 PM By: Linton Ham MD Entered By: Linton Ham on 05/19/2015 14:02:52

## 2015-05-21 NOTE — Progress Notes (Signed)
KEYAUNA, [name] ([id number]) Visit Report for 05/19/2015 Arrival Information Details Patient Name: [name], [name]. Date of Service: 05/19/2015 12:45 PM Medical Record Patient Account Number: [id number] Number: Treating RN: Rotem, Lijewski 07-04-1932 (82 y.o. Other Clinician: Date of Birth/Sex: Female) Treating ROBSON, MICHAEL Primary Care Physician/Extender: Claudette Laws Physician: Referring Physician: Melina Modena in Treatment: 2 Visit Information History Since Last Visit Added or deleted any medications: No Patient Arrived: Ambulatory Any new allergies or adverse reactions: No Arrival Time: 12:55 Had a fall or experienced change in No Accompanied By: spouse activities of daily living that may affect Transfer Assistance: None risk of falls: Patient Identification Verified: Yes Signs or symptoms of abuse/neglect since last No Secondary Verification Process Yes visito Completed: Hospitalized since last visit: No Patient Requires Transmission-Based No Pain Present Now: No Precautions: Patient Has Alerts: Yes Patient Alerts: ASA Electronic Signature(s) Signed: 05/19/2015 5:13:21 PM By: Montey Hora Entered By: Montey Hora on 05/19/2015 12:59:27 Bousquet, [name] ([id number]) -------------------------------------------------------------------------------- Encounter Discharge Information Details Patient Name: [name]. Date of Service: 05/19/2015 12:45 PM Medical Record Patient Account Number: [id number] Number: Treating RN: Henretter, Chakrabarti 08-15-1932 (82 y.o. Other Clinician: Date of Birth/Sex: Female) Treating ROBSON, MICHAEL Primary Care Physician/Extender: Claudette Laws Physician: Referring Physician: Melina Modena in Treatment: 2 Encounter Discharge Information Items Discharge Pain Level: 0 Discharge Condition: Stable Ambulatory Status: Cane Discharge Destination: Home Transportation: Private Auto Accompanied By: spouse Schedule Follow-up  Appointment: Yes Medication Reconciliation completed No and provided to Patient/Care Jacquese Cassarino: Provided on Clinical Summary of Care: 05/19/2015 Form Type Recipient Paper Patient JW Electronic Signature(s) Signed: 05/19/2015 2:06:39 PM By: Ruthine Dose Entered By: Ruthine Dose on 05/19/2015 14:06:39 Sudol, [name] ([id number]) -------------------------------------------------------------------------------- Lower Extremity Assessment Details Patient Name: [name]. Date of Service: 05/19/2015 12:45 PM Medical Record Patient Account Number: [id number] Number: Treating RN: Adithri, Cashell 1932-07-22 (82 y.o. Other Clinician: Date of Birth/Sex: Female) Treating ROBSON, MICHAEL Primary Care Physician/Extender: Claudette Laws Physician: Referring Physician: Melina Modena in Treatment: 2 Edema Assessment Assessed: [Left: No] [Right: No] Edema: [Left: Ye] [Right: s] Calf Left: Right: Point of Measurement: 34 cm From Medial Instep 31.5 cm cm Ankle Left: Right: Point of Measurement: 10 cm From Medial Instep 23.3 cm cm Vascular Assessment Pulses: Posterior Tibial Extremity colors, hair growth, and conditions: Extremity Color: [Left:Red] Hair Growth on Extremity: [Left:Yes] Temperature of Extremity: [Left:Hot] Capillary Refill: [Left:< 3 seconds] Electronic Signature(s) Signed: 05/19/2015 5:13:21 PM By: Montey Hora Entered By: Montey Hora on 05/19/2015 13:08:31 Wessels, [name] ([id number]) -------------------------------------------------------------------------------- Multi Wound Chart Details Patient Name: [name]. Date of Service: 05/19/2015 12:45 PM Medical Record Patient Account Number: [id number] Number: Treating RN: Karalina, Kamphaus 1933-01-13 (82 y.o. Other Clinician: Date of Birth/Sex: Female) Treating ROBSON, MICHAEL Primary Care Physician/Extender: Claudette Laws Physician: Referring Physician: Melina Modena in Treatment: 2 Vital  Signs Height(in): 68 Pulse(bpm): 58 Weight(lbs): 150 Blood Pressure 133/56 (mmHg): Body Mass Index(BMI): 23 Temperature(F): 97.9 Respiratory Rate 18 (breaths/min): Photos: [10:No Photos] [N/A:N/A] Wound Location: [10:Left Lower Leg - Midline N/A] Wounding Event: [10:Trauma] [N/A:N/A] Primary Etiology: [10:Trauma, Other] [N/A:N/A] Comorbid History: [10:Cataracts, Anemia, Arrhythmia, Hypertension, Osteoarthritis, Received Radiation] [N/A:N/A] Date Acquired: [10:04/30/2015] [N/A:N/A] Weeks of Treatment: [10:2] [N/A:N/A] Wound Status: [10:Open] [N/A:N/A] Measurements L x W x D 3.6x3.1x0.1 [N/A:N/A] (cm) Area (cm) : [10:8.765] [N/A:N/A] Volume (cm) : [10:0.877] [N/A:N/A] % Reduction in Area: [10:50.40%] [N/A:N/A] % Reduction in Volume: 50.40% [N/A:N/A] Classification: [10:Partial Thickness] [N/A:N/A] Exudate Amount: [10:None Present] [N/A:N/A] Wound  Margin: [10:Flat and Intact] [N/A:N/A] Granulation Amount: [10:None Present (0%)] [N/A:N/A] Necrotic Amount: [10:Large (67-100%)] [N/A:N/A] Necrotic Tissue: [10:Eschar] [N/A:N/A] Exposed Structures: [10:Fascia: No Fat: No Tendon: No Muscle: No] [N/A:N/A] Joint: No Bone: No Limited to Skin Breakdown Epithelialization: None N/A N/A Periwound Skin Texture: Edema: Yes N/A N/A Periwound Skin Dry/Scaly: Yes N/A N/A Moisture: Maceration: No Moist: No Periwound Skin Color: Erythema: Yes N/A N/A Erythema Location: Circumferential N/A N/A Erythema Measurement: Marked N/A N/A Erythema Change: No Change N/A N/A Temperature: Hot N/A N/A Tenderness on Yes N/A N/A Palpation: Wound Preparation: Ulcer Cleansing: N/A N/A Rinsed/Irrigated with Saline Topical Anesthetic Applied: Other: lidocaine 4% Treatment Notes Electronic Signature(s) Signed: 05/19/2015 5:13:21 PM By: Montey Hora Entered By: Montey Hora on 05/19/2015 13:47:00 Hewett, [name]  ([id number]) -------------------------------------------------------------------------------- [place] Details Patient Name: [name]. Date of Service: 05/19/2015 12:45 PM Medical Record Patient Account Number: [id number] Number: Treating RN: Ellabelle, Depaola 1932-06-23 (82 y.o. Other Clinician: Date of Birth/Sex: Female) Treating ROBSON, MICHAEL Primary Care Physician/Extender: Claudette Laws Physician: Referring Physician: Melina Modena in Treatment: 2 Active Inactive Electronic Signature(s) Signed: 05/19/2015 5:13:21 PM By: Montey Hora Entered By: Montey Hora on 05/19/2015 13:46:49 Cabreja, [name] ([id number]) -------------------------------------------------------------------------------- Patient/Caregiver Education Details Patient Name: [name] Date of Service: 05/19/2015 12:45 PM Medical Record Patient Account Number: [id number] Number: Treating RN: Kiauna, Tulp [birth year] (82 y.o. Other Clinician: Date of Birth/Gender: Female) Treating ROBSON, MICHAEL Primary Care Physician/Extender: Claudette Laws Physician: Suella Grove in Treatment: 2 Referring Physician: Emily Filbert Education Assessment Education Provided To: Patient and Caregiver Education Topics Provided Wound/Skin Impairment: Handouts: Other: wound care and ace wrap Methods: Demonstration, Explain/Verbal Responses: State content correctly Electronic Signature(s) Signed: 05/19/2015 5:13:21 PM By: Montey Hora Entered By: Montey Hora on 05/19/2015 14:06:13 Schnapp, [name] ([id number]) -------------------------------------------------------------------------------- Wound Assessment Details Patient Name: [name]. Date of Service: 05/19/2015 12:45 PM Medical Record Patient Account Number: [id number] Number: Treating RN: Keeona, Liebelt 12/06/1932 (82 y.o. Other Clinician: Date of Birth/Sex: Female) Treating ROBSON, MICHAEL Primary Care  Physician/Extender: Claudette Laws Physician: Referring Physician: Melina Modena in Treatment: 2 Wound Status Wound Number: 10 Primary Trauma, Other Etiology: Wound Location: Left Lower Leg - Midline Wound Open Wounding Event: Trauma Status: Date Acquired: 04/30/2015 Comorbid Cataracts, Anemia, Arrhythmia, Weeks Of Treatment: 2 History: Hypertension, Osteoarthritis, Received Clustered Wound: No Radiation Photos Photo Uploaded By: Montey Hora on 05/19/2015 16:48:41 Wound Measurements Length: (cm) 3.6 Width: (cm) 3.1 Depth: (cm) 0.1 Area: (cm) 8.765 Volume: (cm) 0.877 % Reduction in Area: 50.4% % Reduction in Volume: 50.4% Epithelialization: None Tunneling: No Undermining: No Wound Description Classification: Partial Thickness Wound Margin: Flat and Intact Exudate Amount: None Present Foul Odor After Cleansing: No Wound Bed Granulation Amount: None Present (0%) Exposed Structure Necrotic Amount: Large (67-100%) Fascia Exposed: No Necrotic Quality: Eschar Fat Layer Exposed: No Knecht, Denasia A. ([id number]) Tendon Exposed: No Muscle Exposed: No Joint Exposed: No Bone Exposed: No Limited to Skin Breakdown Periwound Skin Texture Texture Color No Abnormalities Noted: No No Abnormalities Noted: No Localized Edema: Yes Erythema: Yes Erythema Location: Circumferential Moisture Erythema Measurement: Marked No Abnormalities Noted: No Erythema Change: No Change Dry / Scaly: Yes Maceration: No Temperature / Pain Moist: No Temperature: Hot Tenderness on Palpation: Yes Wound Preparation Ulcer Cleansing: Rinsed/Irrigated with Saline Topical Anesthetic Applied: Other: lidocaine 4%, Treatment Notes Wound #10 (Left, Midline Lower Leg) 1. Cleansed with: Clean wound with Normal Saline 2. Anesthetic Topical Lidocaine 4% cream to wound bed prior to  debridement 3. Peri-wound Care: Skin Prep 4. Dressing Applied: Aquacel Ag 5. Secondary Dressing  Applied Bordered Foam Dressing Notes ace wrap Electronic Signature(s) Signed: 05/19/2015 5:13:21 PM By: Montey Hora Entered By: Montey Hora on 05/19/2015 13:06:12 Peddy, [name] ([id number]) -------------------------------------------------------------------------------- [place] Details Patient Name: [name]. Date of Service: 05/19/2015 12:45 PM Medical Record Patient Account Number: [id number] Number: Treating RN: Mashawn, Gwathney 04-12-1933 (82 y.o. Other Clinician: Date of Birth/Sex: Female) Treating ROBSON, MICHAEL Primary Care Physician/Extender: Claudette Laws Physician: Referring Physician: Melina Modena in Treatment: 2 Vital Signs Time Taken: 13:08 Temperature (F): 97.9 Height (in): 68 Pulse (bpm): 58 Weight (lbs): 150 Respiratory Rate (breaths/min): 18 Body Mass Index (BMI): 22.8 Blood Pressure (mmHg): 133/56 Reference Range: 80 - 120 mg / dl Electronic Signature(s) Signed: 05/19/2015 5:13:21 PM By: Montey Hora Entered By: Montey Hora on 05/19/2015 13:09:43

## 2015-05-26 ENCOUNTER — Encounter: Payer: PPO | Admitting: Internal Medicine

## 2015-05-26 DIAGNOSIS — S81801A Unspecified open wound, right lower leg, initial encounter: Secondary | ICD-10-CM | POA: Diagnosis not present

## 2015-05-26 DIAGNOSIS — L03115 Cellulitis of right lower limb: Secondary | ICD-10-CM | POA: Diagnosis not present

## 2015-05-27 NOTE — Progress Notes (Signed)
[name], [name] ([id number]) Visit Report for 05/26/2015 Chief Complaint Document Details Patient Name: [name], [name]. Date of Service: 05/26/2015 10:00 AM Medical Record Patient Account Number: [id number] Number: Treating RN: Kaeley, Bobko 1932-09-11 (82 y.o. Other Clinician: Date of Birth/Sex: Female) Treating Omarri Eich Primary Care Physician/Extender: Claudette Laws Physician: Referring Physician: Melina Modena in Treatment: 3 Information Obtained from: Patient Chief Complaint Right anterior calf traumatic hematoma. Cellulitis. Electronic Signature(s) Signed: 05/26/2015 5:51:40 PM By: Linton Ham MD Entered By: Linton Ham on 05/26/2015 12:36:38 [name], [name] ([id number]) -------------------------------------------------------------------------------- Debridement Details Patient Name: [name]. Date of Service: 05/26/2015 10:00 AM Medical Record Patient Account Number: [id number] Number: Treating RN: Jaquitta, Headden 1932-08-29 (82 y.o. Other Clinician: Date of Birth/Sex: Female) Treating Catera Hankins Primary Care Physician/Extender: Claudette Laws Physician: Referring Physician: Melina Modena in Treatment: 3 Debridement Performed for Wound #10 Left Lower Leg Assessment: Performed By: Physician Ricard Dillon, MD Debridement: Debridement Pre-procedure Yes Verification/Time Out Taken: Start Time: 10:38 Pain Control: Lidocaine 4% Topical Solution Level: Skin/Subcutaneous Tissue Total Area Debrided (L x 3.5 (cm) x 3.1 (cm) = 10.85 (cm) W): Tissue and other Viable, Non-Viable, Fibrin/Slough, Subcutaneous material debrided: Instrument: Curette Bleeding: Moderate Hemostasis Achieved: Pressure End Time: 10:43 Procedural Pain: 0 Post Procedural Pain: 0 Response to Treatment: Procedure was tolerated well Post Debridement Measurements of Total Wound Length: (cm) 3.5 Width: (cm) 3.1 Depth: (cm) 0.1 Volume: (cm) 0.852 Post  Procedure Diagnosis Same as Pre-procedure Electronic Signature(s) Signed: 05/26/2015 5:20:55 PM By: Montey Hora Signed: 05/26/2015 5:51:40 PM By: Linton Ham MD Entered By: Linton Ham on 05/26/2015 12:36:25 [name], [name] ([id number]) [name], [name] ([id number]) -------------------------------------------------------------------------------- HPI Details Patient Name: [name]. Date of Service: 05/26/2015 10:00 AM Medical Record Patient Account Number: [id number] Number: Treating RN: Felix, Adams Mar 03, 1933 (82 y.o. Other Clinician: Date of Birth/Sex: Female) Treating Tracer Gutridge Primary Care Physician/Extender: Claudette Laws Physician: Referring Physician: Melina Modena in Treatment: 3 History of Present Illness HPI Description: Very pleasant 82 year old with history of atrial fibrillation and peripheral vascular disease. 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 Healed as of November 2016. Seen by her cardiologist Dr. Kathlyn [place] in April 2016. Noninvasive vascular evaluation showed an ABI of 0.68 on the right and 0.85 on the left. 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 One-vessel runoff below the knee via the peroneal artery with short occlusion of the anterior tibial artery and reconstitution. No intervention performed. She hit her leg on a wooden duck 2 days ago and noted immediate swelling. This has not significantly increased in size but has become "softer". No drainage. Mild pain. Ambulating per her baseline. No significant claudication. No antibiotics. No fever or chills. No drainage. Unable to apply her compression stockings. 05/12/2015 -- the culture done last week -- no growth was found. patient already on clindamycin and will complete her course. 05/19/15; the patient is on antibiotics  prescribed by her primary physician. This is a wound that was caused by traumatizing her right leg on a wooden duck. She had a hematoma which is since been evacuated. She is using Aquacel Ag and foam but no compression. Her treatment nurse tells me today that there is a reason for the noncompression, apparently the patient did not tolerate them well in the past for reasons that are not clear. She does  have venous insufficiency/stasis physiology 05/26/15; once again the wound is fairly liberally covered by a tight fibrinous slough. She does not tolerate debridement with a curette well due to pain even with 4% lidocaine. She has been using Aquacel Ag Electronic Signature(s) Signed: 05/26/2015 5:51:40 PM By: Linton Ham MD Entered By: Linton Ham on 05/26/2015 12:37:57 [name], [name] ([id number]) -------------------------------------------------------------------------------- Physical Exam Details Patient Name: [name]. Date of Service: 05/26/2015 10:00 AM Medical Record Patient Account Number: [id number] Number: Treating RN: Kitten, Baeder [birth year] (82 y.o. Other Clinician: Date of Birth/Sex: Female) Treating Chani Ghanem Primary Care Physician/Extender: Claudette Laws Physician: Referring Physician: Melina Modena in Treatment: 3 Notes Wound exam; the areas on the right anterior calf. I once again a tight fibrinous slough over the surface of this. She also has a small "tape" injury to the superior lateral aspect of the wound. Her edema control is better than last week I think they are Ace wrap being her. She has known PAD. Her peripheral pulses are palpable. I did not remove the bandage on her right lateral malleolus which I discussed with her last week Electronic Signature(s) Signed: 05/26/2015 5:51:40 PM By: Linton Ham MD Entered By: Linton Ham on 05/26/2015 12:39:16 [name], [name]  ([id number]) -------------------------------------------------------------------------------- Physician Orders Details Patient Name: [name]. Date of Service: 05/26/2015 10:00 AM Medical Record Patient Account Number: [id number] Number: Treating RN: Maislyn, Michie [birth year] (82 y.o. Other Clinician: Date of Birth/Sex: Female) Treating Walter Grima Primary Care Physician/Extender: Claudette Laws Physician: Referring Physician: Melina Modena in Treatment: 3 Verbal / Phone Orders: Yes Clinician: Montey Hora Read Back and Verified: Yes Diagnosis Coding Wound Cleansing Wound #10 Left Lower Leg o Cleanse wound with mild soap and water o May Shower, gently pat wound dry prior to applying new dressing. o May shower with protection. Wound #11 Right,Proximal,Lateral Lower Leg o Cleanse wound with mild soap and water o May Shower, gently pat wound dry prior to applying new dressing. o May shower with protection. Anesthetic Wound #10 Left Lower Leg o Topical Lidocaine 4% cream applied to wound bed prior to debridement Wound #11 Right,Proximal,Lateral Lower Leg o Topical Lidocaine 4% cream applied to wound bed prior to debridement Primary Wound Dressing Wound #10 Left Lower Leg o Santyl Ointment Wound #11 Right,Proximal,Lateral Lower Leg o Aquacel Ag Secondary Dressing Wound #10 Left Lower Leg o Boardered Foam Dressing Wound #11 Right,Proximal,Lateral Lower Leg o Boardered Foam Dressing Dressing Change Frequency [name], Latoya A. ([id number]) Wound #10 Left Lower Leg o Change dressing every other day. Wound #11 Right,Proximal,Lateral Lower Leg o Change dressing every other day. Follow-up Appointments Wound #10 Left Lower Leg o Return Appointment in 1 week. Wound #11 Right,Proximal,Lateral Lower Leg o Return Appointment in 1 week. Edema Control Wound #10 Left Lower Leg o Other: - ace wrap on in the morning and off before  bed Wound #11 Right,Proximal,Lateral Lower Leg o Other: - ace wrap on in the morning and off before bed Medications-please add to medication list. Wound #10 Left Lower Leg o Santyl Enzymatic Ointment Patient Medications Allergies: Lipitor, Penicillins, doxycycline, Vioxx, Cipro, amlodipine, Macrobid Notifications Medication Indication Start End Santyl 05/26/2015 DOSE topical 250 unit/gram ointment - ointment topical Electronic Signature(s) Signed: 05/26/2015 5:20:55 PM By: Montey Hora Signed: 05/26/2015 5:51:40 PM By: Linton Ham MD Entered By: Montey Hora on 05/26/2015 10:44:32 [name], [name] ([id number]) -------------------------------------------------------------------------------- Prescription 05/26/2015 Patient Name: [name] Physician: Ricard Dillon MD Date of Birth: Mar 23, 1933 NPI#: [id number]  Sex: F DEA#: [id number] Phone #: [phone] License #: [id number] Patient Address: [address], [place] [address] [facility] [address] [place], [place] [address] [phone] Allergies Lipitor Reaction: joint pain Severity: Moderate Penicillins Reaction: rash Severity: Moderate doxycycline Reaction: nausea, aching, itching, shaky Severity: Moderate Vioxx Reaction: rash Cipro Reaction: nausea amlodipine Reaction: Nausea and vomiting Macrobid Reaction: rash TASHALA, KLOCKO ([id number]) Medication Medication: Route: Strength: Form: Santyl topical 250 unit/gram ointment Class: TOPICAL/MUCOUS MEMBR./SUBCUT. ENZYMES Dose: Frequency / Time: Indication: ointment topical Number of Refills: Number of Units: 0 Generic Substitution: Start Date: End Date: Administered at Substitution Permitted [id number] Facility: No Note to Pharmacy: Signature(s): Date(s): Electronic Signature(s) Signed: 05/26/2015 5:51:40 PM By: Linton Ham MD Entered By: Linton Ham on 05/26/2015  12:42:07 Leech, [name] ([id number]) --------------------------------------------------------------------------------  Problem List Details Patient Name: [name]. Date of Service: 05/26/2015 10:00 AM Medical Record Patient Account Number: [id number] Number: Treating RN: [name], [name] [birth year] (82 y.o. Other Clinician: Date of Birth/Sex: Female) Treating Aylissa Heinemann Primary Care Physician/Extender: Claudette Laws Physician: Referring Physician: Melina Modena in Treatment: 3 Active Problems ICD-10 Encounter Code Description Active Date Diagnosis S81.801A Unspecified open wound, right lower leg, initial encounter 05/05/2015 Yes I48.2 Chronic atrial fibrillation 05/05/2015 Yes L03.115 Cellulitis of right lower limb 05/05/2015 Yes Inactive Problems Resolved Problems Electronic Signature(s) Signed: 05/26/2015 5:51:40 PM By: Linton Ham MD Entered By: Linton Ham on 05/26/2015 12:36:10 [name], [name] ([id number]) -------------------------------------------------------------------------------- Progress Note Details Patient Name: [name]. Date of Service: 05/26/2015 10:00 AM Medical Record Patient Account Number: [id number] Number: Treating RN: Fatuma, Canizales 1932-09-04 (82 y.o. Other Clinician: Date of Birth/Sex: Female) Treating Beverlyn Mcginness Primary Care Physician/Extender: Claudette Laws Physician: Referring Physician: Melina Modena in Treatment: 3 Subjective Chief Complaint Information obtained from Patient Right anterior calf traumatic hematoma. Cellulitis. History of Present Illness (HPI) Very pleasant 82 year old with history of atrial fibrillation and peripheral vascular disease. 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 Healed as of November 2016. Seen by her cardiologist Dr. Kathlyn [place] in April 2016. Noninvasive vascular evaluation showed  an ABI of 0.68 on the right and 0.85 on the left. 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 One-vessel runoff below the knee via the peroneal artery with short occlusion of the anterior tibial artery and reconstitution. No intervention performed. She hit her leg on a wooden duck 2 days ago and noted immediate swelling. This has not significantly increased in size but has become "softer". No drainage. Mild pain. Ambulating per her baseline. No significant claudication. No antibiotics. No fever or chills. No drainage. Unable to apply her compression stockings. 05/12/2015 -- the culture done last week -- no growth was found. patient already on clindamycin and will complete her course. 05/19/15; the patient is on antibiotics prescribed by her primary physician. This is a wound that was caused by traumatizing her right leg on a wooden duck. She had a hematoma which is since been evacuated. She is using Aquacel Ag and foam but no compression. Her treatment nurse tells me today that there is a reason for the noncompression, apparently the patient did not tolerate them well in the past for reasons that are not clear. She does have venous insufficiency/stasis physiology 05/26/15; once again the wound is fairly liberally covered by a tight  fibrinous slough. She does not tolerate debridement with a curette well due to pain even with 4% lidocaine. She has been using Aquacel Ag Objective Constitutional [name], [name] A. ([id number]) Vitals Time Taken: 10:23 AM, Height: 68 in, Weight: 150 lbs, BMI: 22.8, Temperature: 97.5 F, Pulse: 56 bpm, Respiratory Rate: 18 breaths/min, Blood Pressure: 121/49 mmHg. Integumentary (Hair, Skin) Wound #10 status is Open. Original cause of wound was Trauma. The wound is located on the Left Lower Leg. The wound measures 3.5cm length x 3.1cm width x 0.1cm depth; 8.522cm^2 area and 0.852cm^3 volume. The wound is  limited to skin breakdown. There is no tunneling or undermining noted. There is a large amount of serosanguineous drainage noted. The wound margin is flat and intact. There is small (1-33%) pink granulation within the wound bed. There is a large (67-100%) amount of necrotic tissue within the wound bed including Adherent Slough. The periwound skin appearance exhibited: Localized Edema, Dry/Scaly, Erythema. The periwound skin appearance did not exhibit: Maceration, Moist. The surrounding wound skin color is noted with erythema which is circumferential. Erythema is marked. Periwound temperature was noted as Hot. The periwound has tenderness on palpation. Wound #11 status is Open. Original cause of wound was Trauma. The wound is located on the Right,Proximal,Lateral Lower Leg. The wound measures 1.5cm length x 0.5cm width x 0.1cm depth; 0.589cm^2 area and 0.059cm^3 volume. The wound is limited to skin breakdown. There is no tunneling or undermining noted. There is a medium amount of serosanguineous drainage noted. The wound margin is flat and intact. There is large (67-100%) red granulation within the wound bed. There is no necrotic tissue within the wound bed. The periwound skin appearance exhibited: Moist, Erythema. The periwound skin appearance did not exhibit: Callus, Crepitus, Excoriation, Fluctuance, Friable, Induration, Localized Edema, Rash, Scarring, Dry/Scaly, Maceration, Atrophie Blanche, Cyanosis, Ecchymosis, Hemosiderin Staining, Mottled, Pallor, Rubor. The surrounding wound skin color is noted with erythema which is circumferential. Assessment Active Problems ICD-10 S81.801A - Unspecified open wound, right lower leg, initial encounter I48.2 - Chronic atrial fibrillation L03.115 - Cellulitis of right lower limb Procedures Wound #10 Wound #10 is a Trauma, Other located on the Left Lower Leg . There was a Skin/Subcutaneous Tissue Debridement [id number]) debridement with total area  of 10.85 sq cm performed by Ricard Dillon, MD. with the following instrument(s): Curette to remove Viable and Non-Viable tissue/material including Fibrin/Slough and Subcutaneous after achieving pain control using Lidocaine 4% Topical Solution. A time out was conducted prior to the start of the procedure. A Moderate amount of bleeding was controlled with Yardley, Meriah A. ([id number]) Pressure. The procedure was tolerated well with a pain level of 0 throughout and a pain level of 0 following the procedure. Post Debridement Measurements: 3.5cm length x 3.1cm width x 0.1cm depth; 0.852cm^3 volume. Post procedure Diagnosis Wound #10: Same as Pre-Procedure Plan Wound Cleansing: Wound #10 Left Lower Leg: Cleanse wound with mild soap and water May Shower, gently pat wound dry prior to applying new dressing. May shower with protection. Wound #11 Right,Proximal,Lateral Lower Leg: Cleanse wound with mild soap and water May Shower, gently pat wound dry prior to applying new dressing. May shower with protection. Anesthetic: Wound #10 Left Lower Leg: Topical Lidocaine 4% cream applied to wound bed prior to debridement Wound #11 Right,Proximal,Lateral Lower Leg: Topical Lidocaine 4% cream applied to wound bed prior to debridement Primary Wound Dressing: Wound #10 Left Lower Leg: Santyl Ointment Wound #11 Right,Proximal,Lateral Lower Leg: Aquacel Ag Secondary Dressing: Wound #10 Left Lower Leg: Boardered  Foam Dressing Wound #11 Right,Proximal,Lateral Lower Leg: Boardered Foam Dressing Dressing Change Frequency: Wound #10 Left Lower Leg: Change dressing every other day. Wound #11 Right,Proximal,Lateral Lower Leg: Change dressing every other day. Follow-up Appointments: Wound #10 Left Lower Leg: Return Appointment in 1 week. Wound #11 Right,Proximal,Lateral Lower Leg: Return Appointment in 1 week. Edema Control: Wound #10 Left Lower Leg: Other: - ace wrap on in the morning and off before  bed Wound #11 Right,Proximal,Lateral Lower Leg: JACOBI, SCORZA ([id number]) Other: - ace wrap on in the morning and off before bed Medications-please add to medication list.: Wound #10 Left Lower Leg: Santyl Enzymatic Ointment The following medication(s) was prescribed: Santyl topical 250 unit/gram ointment ointment topical starting 05/26/2015 #1 I changed her primary dressing to sample it to see if we can loosen up the slough, foam. We wrote her a prescription for the Santyl. In keeping with her requests not to have more aggressive compression we used an Ace wrap. The patient's friend is helping her change the dressing every second day. I am expecting she will need to be again debridement next week Electronic Signature(s) Signed: 05/26/2015 5:51:40 PM By: Linton Ham MD Entered By: Linton Ham on 05/26/2015 12:40:53 Geisen, [name] ([id number]) -------------------------------------------------------------------------------- [place] Details Patient Name: [name]. Date of Service: 05/26/2015 Medical Record Patient Account Number: [id number] Number: Treating RN: Lizania, Eickman [birth year] (82 y.o. Other Clinician: Date of Birth/Sex: Female) Treating Tanyika Barros Primary Care Physician/Extender: Claudette Laws Physician: Suella Grove in Treatment: 3 Referring Physician: Emily Filbert Diagnosis Coding ICD-10 Codes Code Description [phone] Unspecified open wound, right lower leg, initial encounter I48.2 Chronic atrial fibrillation L03.115 Cellulitis of right lower limb Facility Procedures CPT4 Code: [id number] Description: [id number] - DEB SUBQ TISSUE 20 SQ CM/< ICD-10 Description Diagnosis L03.115 Cellulitis of right lower limb Modifier: Quantity: 1 Physician Procedures CPT4 Code: [id number] Description: [id number] - WC PHYS SUBQ TISS 20 SQ CM ICD-10 Description Diagnosis L03.115 Cellulitis of right lower limb Modifier: Quantity: 1 Electronic Signature(s) Signed: 05/26/2015 5:51:40 PM  By: Linton Ham MD Entered By: Linton Ham on 05/26/2015 12:42:02

## 2015-05-27 NOTE — Progress Notes (Signed)
[name], [name] ([id number]) Visit Report for 05/26/2015 Arrival Information Details Patient Name: [name], [name]. Date of Service: 05/26/2015 10:00 AM Medical Record Patient Account Number: [id number] Number: Treating RN: Champaigne, Kissee 11/25/1932 (82 y.o. Other Clinician: Date of Birth/Sex: Female) Treating ROBSON, MICHAEL Primary Care Physician/Extender: Claudette Laws Physician: Referring Physician: Melina Modena in Treatment: 3 Visit Information History Since Last Visit Added or deleted any medications: No Patient Arrived: Cane Any new allergies or adverse reactions: No Arrival Time: 10:21 Had a fall or experienced change in No Accompanied By: spouse activities of daily living that may affect Transfer Assistance: None risk of falls: Patient Identification Verified: Yes Signs or symptoms of abuse/neglect since last No Secondary Verification Process Completed: Yes visito Patient Requires Transmission-Based No Hospitalized since last visit: No Precautions: Pain Present Now: No Patient Has Alerts: Yes Patient Alerts: ASA Electronic Signature(s) Signed: 05/26/2015 5:20:55 PM By: Montey Hora Entered By: Montey Hora on 05/26/2015 10:22:34 [name], [name] ([id number]) -------------------------------------------------------------------------------- Encounter Discharge Information Details Patient Name: [name]. Date of Service: 05/26/2015 10:00 AM Medical Record Patient Account Number: [id number] Number: Treating RN: Dierdra, Magliocca 02-09-1933 (82 y.o. Other Clinician: Date of Birth/Sex: Female) Treating ROBSON, MICHAEL Primary Care Physician/Extender: Claudette Laws Physician: Referring Physician: Melina Modena in Treatment: 3 Encounter Discharge Information Items Discharge Pain Level: 0 Discharge Condition: Stable Ambulatory Status: Cane Discharge Destination: Home Transportation: Private Auto Accompanied By: spouse Schedule Follow-up  Appointment: Yes Medication Reconciliation completed No and provided to Patient/Care Lacee Grey: Provided on Clinical Summary of Care: 05/26/2015 Form Type Recipient Paper Patient JW Electronic Signature(s) Signed: 05/26/2015 5:20:55 PM By: Montey Hora Previous Signature: 05/26/2015 10:59:49 AM Version By: Ruthine Dose Entered By: Montey Hora on 05/26/2015 11:02:57 [name], [name] ([id number]) -------------------------------------------------------------------------------- Lower Extremity Assessment Details Patient Name: [name]. Date of Service: 05/26/2015 10:00 AM Medical Record Patient Account Number: [id number] Number: Treating RN: Xavianna, Sunderman 09/08/1932 (82 y.o. Other Clinician: Date of Birth/Sex: Female) Treating ROBSON, MICHAEL Primary Care Physician/Extender: Claudette Laws Physician: Referring Physician: Melina Modena in Treatment: 3 Edema Assessment Assessed: [Left: No] [Right: No] Edema: [Left: Ye] [Right: s] Calf Left: Right: Point of Measurement: 34 cm From Medial Instep 31.5 cm cm Ankle Left: Right: Point of Measurement: 10 cm From Medial Instep 22.5 cm cm Vascular Assessment Pulses: Posterior Tibial Dorsalis Pedis Palpable: [Left:Yes] Extremity colors, hair growth, and conditions: Extremity Color: [Left:Mottled] Hair Growth on Extremity: [Left:No] Temperature of Extremity: [Left:Warm] Capillary Refill: [Left:< 3 seconds] Electronic Signature(s) Signed: 05/26/2015 5:20:55 PM By: Montey Hora Entered By: Montey Hora on 05/26/2015 10:25:27 Cavagnaro, [name] ([id number]) -------------------------------------------------------------------------------- Multi Wound Chart Details Patient Name: [name]. Date of Service: 05/26/2015 10:00 AM Medical Record Patient Account Number: [id number] Number: Treating RN: Alitha, Welliver Jul 21, 1932 (82 y.o. Other Clinician: Date of Birth/Sex: Female) Treating ROBSON, MICHAEL Primary Care  Physician/Extender: Claudette Laws Physician: Referring Physician: Melina Modena in Treatment: 3 Vital Signs Height(in): 68 Pulse(bpm): 56 Weight(lbs): 150 Blood Pressure 121/49 (mmHg): Body Mass Index(BMI): 23 Temperature(F): 97.5 Respiratory Rate 18 (breaths/min): Photos: [10:No Photos] [11:No Photos] [N/A:N/A] Wound Location: [10:Left Lower Leg] [11:Right Lower Leg - Lateral, N/A Proximal] Wounding Event: [10:Trauma] [11:Trauma] [N/A:N/A] Primary Etiology: [10:Trauma, Other] [11:Trauma, Other] [N/A:N/A] Comorbid History: [10:Cataracts, Anemia, Arrhythmia, Hypertension, Arrhythmia, Hypertension, Osteoarthritis, Received Osteoarthritis, Received Radiation] [11:Cataracts, Anemia, Radiation] [N/A:N/A] Date Acquired: [10:04/30/2015] [11:05/25/2015] [N/A:N/A] Weeks of Treatment: [10:3] [11:0] [N/A:N/A] Wound Status: [10:Open] [11:Open] [N/A:N/A] Measurements L x W x [name] 3.5x3.1x0.1 [11:1.5x0.5x0.1] [N/A:N/A] (  cm) Area (cm) : [10:8.522] [11:0.589] [N/A:N/A] Volume (cm) : [10:0.852] [11:0.059] [N/A:N/A] % Reduction in Area: [10:51.80%] [11:N/A] [N/A:N/A] % Reduction in Volume: 51.80% [11:N/A] [N/A:N/A] Classification: [10:Partial Thickness] [11:Partial Thickness] [N/A:N/A] Exudate Amount: [10:Large] [11:Medium] [N/A:N/A] Exudate Type: [10:Serosanguineous] [11:Serosanguineous] [N/A:N/A] Exudate Color: [10:red, brown] [11:red, brown] [N/A:N/A] Wound Margin: [10:Flat and Intact] [11:Flat and Intact] [N/A:N/A] Granulation Amount: [10:Small (1-33%)] [11:Large (67-100%)] [N/A:N/A] Granulation Quality: [10:Pink] [11:Red] [N/A:N/A] Necrotic Amount: [10:Large (67-100%)] [11:None Present (0%)] [N/A:N/A] Exposed Structures: [N/A:N/A] Fascia: No Fascia: No Fat: No Fat: No Tendon: No Tendon: No Muscle: No Muscle: No Joint: No Joint: No Bone: No Bone: No Limited to Skin Limited to Skin Breakdown Breakdown Epithelialization: None None N/A Periwound Skin Texture: Edema: Yes  Edema: No N/A Excoriation: No Induration: No Callus: No Crepitus: No Fluctuance: No Friable: No Rash: No Scarring: No Periwound Skin Dry/Scaly: Yes Moist: Yes N/A Moisture: Maceration: No Maceration: No Moist: No Dry/Scaly: No Periwound Skin Color: Erythema: Yes Erythema: Yes N/A Atrophie Blanche: No Cyanosis: No Ecchymosis: No Hemosiderin Staining: No Mottled: No Pallor: No Rubor: No Erythema Location: Circumferential Circumferential N/A Erythema Measurement: Marked N/A N/A Erythema Change: No Change N/A N/A Temperature: Hot N/A N/A Tenderness on Yes No N/A Palpation: Wound Preparation: Ulcer Cleansing: Ulcer Cleansing: N/A Rinsed/Irrigated with Rinsed/Irrigated with Saline Saline Topical Anesthetic Topical Anesthetic Applied: Other: lidocaine Applied: Other: lidocaine 4% 4% Treatment Notes Electronic Signature(s) Signed: 05/26/2015 5:20:55 PM By: Montey Hora Entered By: Montey Hora on 05/26/2015 10:32:27 [name], [name] ([id number]) [name], [name] ([id number]) -------------------------------------------------------------------------------- [place] Details Patient Name: [name]. Date of Service: 05/26/2015 10:00 AM Medical Record Patient Account Number: [id number] Number: Treating RN: Shontell, Fonda [birth year] (82 y.o. Other Clinician: Date of Birth/Sex: Female) Treating ROBSON, MICHAEL Primary Care Physician/Extender: Claudette Laws Physician: Referring Physician: Melina Modena in Treatment: 3 Active Inactive Electronic Signature(s) Signed: 05/26/2015 5:20:55 PM By: Montey Hora Entered By: Montey Hora on 05/26/2015 10:32:20 [name], [name] ([id number]) -------------------------------------------------------------------------------- Patient/Caregiver Education Details Patient Name: [name] Date of Service: 05/26/2015 10:00 AM Medical Record Patient Account Number: [id number] Number: Treating RN:  [name], [name] 1933-01-16 (82 y.o. Other Clinician: Date of Birth/Gender: Female) Treating ROBSON, MICHAEL Primary Care Physician/Extender: Claudette Laws Physician: Suella Grove in Treatment: 3 Referring Physician: Emily Filbert Education Assessment Education Provided To: Patient and Caregiver Education Topics Provided Wound/Skin Impairment: Handouts: Other: wound care as ordered Methods: Demonstration, Explain/Verbal Responses: State content correctly Electronic Signature(s) Signed: 05/26/2015 5:20:55 PM By: Montey Hora Entered By: Montey Hora on 05/26/2015 11:03:14 [name], [name] ([id number]) -------------------------------------------------------------------------------- Wound Assessment Details Patient Name: [name]. Date of Service: 05/26/2015 10:00 AM Medical Record Patient Account Number: [id number] Number: Treating RN: [name], [name] [birth year] (82 y.o. Other Clinician: Date of Birth/Sex: Female) Treating ROBSON, MICHAEL Primary Care Physician/Extender: Claudette Laws Physician: Referring Physician: Melina Modena in Treatment: 3 Wound Status Wound Number: 10 Primary Trauma, Other Etiology: Wound Location: Left Lower Leg Wound Open Wounding Event: Trauma Status: Date Acquired: 04/30/2015 Comorbid Cataracts, Anemia, Arrhythmia, Weeks Of Treatment: 3 History: Hypertension, Osteoarthritis, Received Clustered Wound: No Radiation Photos Photo Uploaded By: Montey Hora on 05/26/2015 16:47:56 Wound Measurements Length: (cm) 3.5 Width: (cm) 3.1 Depth: (cm) 0.1 Area: (cm) 8.522 Volume: (cm) 0.852 % Reduction in Area: 51.8% % Reduction in Volume: 51.8% Epithelialization: None Tunneling: No Undermining: No Wound Description Classification: Partial Thickness Wound Margin: Flat and Intact Exudate Amount: Large Exudate Type: Serosanguineous Exudate Color: red, brown Foul Odor After Cleansing: No Wound Bed  Granulation Amount: Small (1-33%)  Exposed Structure Fouse, Isadore A. ([id number]) Granulation Quality: Pink Fascia Exposed: No Necrotic Amount: Large (67-100%) Fat Layer Exposed: No Necrotic Quality: Adherent Slough Tendon Exposed: No Muscle Exposed: No Joint Exposed: No Bone Exposed: No Limited to Skin Breakdown Periwound Skin Texture Texture Color No Abnormalities Noted: No No Abnormalities Noted: No Localized Edema: Yes Erythema: Yes Erythema Location: Circumferential Moisture Erythema Measurement: Marked No Abnormalities Noted: No Erythema Change: No Change Dry / Scaly: Yes Maceration: No Temperature / Pain Moist: No Temperature: Hot Tenderness on Palpation: Yes Wound Preparation Ulcer Cleansing: Rinsed/Irrigated with Saline Topical Anesthetic Applied: Other: lidocaine 4%, Treatment Notes Wound #10 (Left Lower Leg) 1. Cleansed with: Clean wound with Normal Saline 2. Anesthetic Topical Lidocaine 4% cream to wound bed prior to debridement 4. Dressing Applied: Santyl Ointment 5. Secondary Dressing Applied Bordered Foam Dressing Dry Gauze Notes ace wrap Electronic Signature(s) Signed: 05/26/2015 5:20:55 PM By: Montey Hora Entered By: Montey Hora on 05/26/2015 10:32:12 [name], [name] ([id number]) -------------------------------------------------------------------------------- Wound Assessment Details Patient Name: [name]. Date of Service: 05/26/2015 10:00 AM Medical Record Patient Account Number: [id number] Number: Treating RN: [name], [name] January 09, 1933 (82 y.o. Other Clinician: Date of Birth/Sex: Female) Treating ROBSON, MICHAEL Primary Care Physician/Extender: Claudette Laws Physician: Referring Physician: Melina Modena in Treatment: 3 Wound Status Wound Number: 11 Primary Trauma, Other Etiology: Wound Location: Right Lower Leg - Lateral, Proximal Wound Open Status: Wounding Event: Trauma Comorbid Cataracts, Anemia, Arrhythmia, Date Acquired: 05/25/2015 History:  Hypertension, Osteoarthritis, Received Weeks Of Treatment: 0 Radiation Clustered Wound: No Photos Photo Uploaded By: Montey Hora on 05/26/2015 16:47:12 Wound Measurements Length: (cm) 1.5 % Reduction in Width: (cm) 0.5 % Reduction in Depth: (cm) 0.1 Epithelializati Area: (cm) 0.589 Tunneling: Volume: (cm) 0.059 Undermining: Area: Volume: on: None No No Wound Description Classification: Partial Thickness Wound Margin: Flat and Intact Exudate Amount: Medium Exudate Type: Serosanguineous Exudate Color: red, brown Foul Odor After Cleansing: No Wound Bed Granulation Amount: Large (67-100%) Exposed Structure Duzan, Isel AMarland Kitchen ([id number]) Granulation Quality: Red Fascia Exposed: No Necrotic Amount: None Present (0%) Fat Layer Exposed: No Tendon Exposed: No Muscle Exposed: No Joint Exposed: No Bone Exposed: No Limited to Skin Breakdown Periwound Skin Texture Texture Color No Abnormalities Noted: No No Abnormalities Noted: No Callus: No Atrophie Blanche: No Crepitus: No Cyanosis: No Excoriation: No Ecchymosis: No Fluctuance: No Erythema: Yes Friable: No Erythema Location: Circumferential Induration: No Hemosiderin Staining: No Localized Edema: No Mottled: No Rash: No Pallor: No Scarring: No Rubor: No Moisture No Abnormalities Noted: No Dry / Scaly: No Maceration: No Moist: Yes Wound Preparation Ulcer Cleansing: Rinsed/Irrigated with Saline Topical Anesthetic Applied: Other: lidocaine 4%, Treatment Notes Wound #11 (Right, Proximal, Lateral Lower Leg) 1. Cleansed with: Clean wound with Normal Saline 2. Anesthetic Topical Lidocaine 4% cream to wound bed prior to debridement 4. Dressing Applied: Aquacel Ag 5. Secondary Dressing Applied Bordered Foam Dressing Notes ace wrap Electronic Signature(s) Signed: 05/26/2015 5:20:55 PM By: Montey Hora Entered By: Montey Hora on 05/26/2015 10:31:37 [name], [name] ([id number]) Mullinix, [name]  ([id number]) -------------------------------------------------------------------------------- [place] Details Patient Name: [name] Date of Service: 05/26/2015 10:00 AM Medical Record Patient Account Number: [id number] Number: Treating RN: [name], [name] 30-Oct-1932 (82 y.o. Other Clinician: Date of Birth/Sex: Female) Treating ROBSON, MICHAEL Primary Care Physician/Extender: Claudette Laws Physician: Referring Physician: Melina Modena in Treatment: 3 Vital Signs Time Taken: 10:23 Temperature (F): 97.5 Height (in): 68 Pulse (bpm): 56 Weight (lbs): 150 Respiratory Rate (breaths/min): 18  Body Mass Index (BMI): 22.8 Blood Pressure (mmHg): 121/49 Reference Range: 80 - 120 mg / dl Electronic Signature(s) Signed: 05/26/2015 5:20:55 PM By: Montey Hora Entered By: Montey Hora on 05/26/2015 10:24:38

## 2015-06-02 ENCOUNTER — Encounter: Payer: PPO | Admitting: Internal Medicine

## 2015-06-02 DIAGNOSIS — S81801A Unspecified open wound, right lower leg, initial encounter: Secondary | ICD-10-CM | POA: Diagnosis not present

## 2015-06-03 NOTE — Progress Notes (Signed)
[name], [name] ([id number]) Visit Report for 06/02/2015 Chief Complaint Document Details Patient Name: [name], [name]. Date of Service: 06/02/2015 10:45 AM Medical Record Patient Account Number: [id number] Number: Treating RN: Baruch Gouty, RN, BSN, Rita Jul 04, 1932 [phone] y.o. Other Clinician: Date of Birth/Sex: Female) Treating ROBSON, MICHAEL Primary Care Physician/Extender: Claudette Laws Physician: Referring Physician: Melina Modena in Treatment: 4 Information Obtained from: Patient Chief Complaint Right anterior calf traumatic hematoma. Cellulitis. Electronic Signature(s) Signed: 06/02/2015 5:04:49 PM By: Linton Ham MD Entered By: Linton Ham on 06/02/2015 15:23:35 [name], [name] ([id number]) -------------------------------------------------------------------------------- Debridement Details Patient Name: [name]. Date of Service: 06/02/2015 10:45 AM Medical Record Patient Account Number: [id number] Number: Treating RN: Baruch Gouty, RN, BSN, Rita [birth year] [phone] y.o. Other Clinician: Date of Birth/Sex: Female) Treating ROBSON, MICHAEL Primary Care Physician/Extender: Claudette Laws Physician: Referring Physician: Melina Modena in Treatment: 4 Debridement Performed for Wound #10 Left Lower Leg Assessment: Performed By: Physician Ricard Dillon, MD Debridement: Debridement Pre-procedure Yes Verification/Time Out Taken: Start Time: 11:46 Pain Control: Lidocaine 4% Topical Solution Level: Skin/Subcutaneous Tissue Total Area Debrided (L x 3.8 (cm) x 3 (cm) = 11.4 (cm) W): Tissue and other Non-Viable, Exudate, Fibrin/Slough, Subcutaneous material debrided: Instrument: Curette Bleeding: Minimum Hemostasis Achieved: Pressure End Time: 11:50 Procedural Pain: 0 Post Procedural Pain: 0 Response to Treatment: Procedure was tolerated well Post Debridement Measurements of Total Wound Length: (cm) 3.8 Width: (cm) 3 Depth: (cm) 0.2 Volume: (cm)  1.791 Post Procedure Diagnosis Same as Pre-procedure Electronic Signature(s) Signed: 06/02/2015 5:04:49 PM By: Linton Ham MD Signed: 06/02/2015 5:09:16 PM By: Regan Lemming BSN, RN Entered By: Linton Ham on 06/02/2015 15:23:19 [name], [name] ([id number]) [name], [name] ([id number]) -------------------------------------------------------------------------------- HPI Details Patient Name: [name]. Date of Service: 06/02/2015 10:45 AM Medical Record Patient Account Number: [id number] Number: Treating RN: Baruch Gouty, RN, BSN, Rita 02-17-1933 [phone] y.o. Other Clinician: Date of Birth/Sex: Female) Treating ROBSON, MICHAEL Primary Care Physician/Extender: Claudette Laws Physician: Referring Physician: Melina Modena in Treatment: 4 History of Present Illness HPI Description: Very pleasant 82 year old with history of atrial fibrillation and peripheral vascular disease. 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 Healed as of November 2016. Seen by her cardiologist Dr. Kathlyn [place] in April 2016. Noninvasive vascular evaluation showed an ABI of 0.68 on the right and 0.85 on the left. 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 One-vessel runoff below the knee via the peroneal artery with short occlusion of the anterior tibial artery and reconstitution. No intervention performed. She hit her leg on a wooden duck 2 days ago and noted immediate swelling. This has not significantly increased in size but has become "softer". No drainage. Mild pain. Ambulating per her baseline. No significant claudication. No antibiotics. No fever or chills. No drainage. Unable to apply her compression stockings. 05/12/2015 -- the culture done last week -- no growth was found. patient already on clindamycin and will complete her course. 05/19/15; the  patient is on antibiotics prescribed by her primary physician. This is a wound that was caused by traumatizing her right leg on a wooden duck. She had a hematoma which is since been evacuated. She is using Aquacel Ag and foam but no compression. Her treatment nurse tells me today that there is a reason for the noncompression, apparently the patient did not tolerate them well in the past  for reasons that are not clear. She does have venous insufficiency/stasis physiology 05/26/15; once again the wound is fairly liberally covered by a tight fibrinous slough. She does not tolerate debridement with a curette well due to pain even with 4% lidocaine. She has been using Aquacel Ag 06/02/15 again debridement over the wound area surgically to remove tight in his slough and nonviable subcutaneous tissue. Electronic Signature(s) Signed: 06/02/2015 5:04:49 PM By: Linton Ham MD Entered By: Linton Ham on 06/02/2015 15:24:51 [name], [name] ([id number]) -------------------------------------------------------------------------------- Physical Exam Details Patient Name: [name]. Date of Service: 06/02/2015 10:45 AM Medical Record Patient Account Number: [id number] Number: Treating RN: Baruch Gouty, RN, BSN, Rita Mar 29, 1933 [phone] y.o. Other Clinician: Date of Birth/Sex: Female) Treating ROBSON, MICHAEL Primary Care Physician/Extender: Claudette Laws Physician: Referring Physician: Melina Modena in Treatment: 4 Notes Wound exam; the areas on the right anterior calf. Surgical debridement done. Her edema control is improved. Electronic Signature(s) Signed: 06/02/2015 5:04:49 PM By: Linton Ham MD Entered By: Linton Ham on 06/02/2015 15:25:22 [name], [name] ([id number]) -------------------------------------------------------------------------------- Physician Orders Details Patient Name: [name]. Date of Service: 06/02/2015 10:45 AM Medical Record Patient Account Number:  [id number] Number: Treating RN: Baruch Gouty, RN, BSN, Rita Mar 12, 1933 [phone] y.o. Other Clinician: Date of Birth/Sex: Female) Treating ROBSON, MICHAEL Primary Care Physician/Extender: Claudette Laws Physician: Referring Physician: Melina Modena in Treatment: 4 Verbal / Phone Orders: Yes Clinician: Afful, RN, BSN, Rita Read Back and Verified: Yes Diagnosis Coding Wound Cleansing Wound #10 Left Lower Leg o Cleanse wound with mild soap and water o May Shower, gently pat wound dry prior to applying new dressing. o May shower with protection. Anesthetic Wound #10 Left Lower Leg o Topical Lidocaine 4% cream applied to wound bed prior to debridement Primary Wound Dressing Wound #10 Left Lower Leg o Santyl Ointment Secondary Dressing Wound #10 Left Lower Leg o Boardered Foam Dressing Dressing Change Frequency Wound #10 Left Lower Leg o Change dressing every other day. Follow-up Appointments Wound #10 Left Lower Leg o Return Appointment in 1 week. Edema Control Wound #10 Left Lower Leg o Other: - ace wrap on in the morning and off before bed Medications-please add to medication list. Wound #10 Left Lower Leg KAELONI, ROADS ([id number]) o Santyl Enzymatic Ointment Electronic Signature(s) Signed: 06/02/2015 5:04:49 PM By: Linton Ham MD Signed: 06/02/2015 5:09:16 PM By: Regan Lemming BSN, RN Entered By: Regan Lemming on 06/02/2015 11:49:56 Lucena, [name] ([id number]) -------------------------------------------------------------------------------- Problem List Details Patient Name: [name]. Date of Service: 06/02/2015 10:45 AM Medical Record Patient Account Number: [id number] Number: Treating RN: Baruch Gouty, RN, BSN, Rita 1933/02/18 [phone] y.o. Other Clinician: Date of Birth/Sex: Female) Treating ROBSON, MICHAEL Primary Care Physician/Extender: Claudette Laws Physician: Referring Physician: Melina Modena in Treatment: 4 Active  Problems ICD-10 Encounter Code Description Active Date Diagnosis S81.801A Unspecified open wound, right lower leg, initial encounter 05/05/2015 Yes I48.2 Chronic atrial fibrillation 05/05/2015 Yes L03.115 Cellulitis of right lower limb 05/05/2015 Yes Inactive Problems Resolved Problems Electronic Signature(s) Signed: 06/02/2015 5:04:49 PM By: Linton Ham MD Entered By: Linton Ham on 06/02/2015 15:22:53 Arseneau, [name] ([id number]) -------------------------------------------------------------------------------- Progress Note Details Patient Name: [name]. Date of Service: 06/02/2015 10:45 AM Medical Record Patient Account Number: [id number] Number: Treating RN: Baruch Gouty, RN, BSN, Rita 1932-10-21 [phone] y.o. Other Clinician: Date of Birth/Sex: Female) Treating ROBSON, MICHAEL Primary Care Physician/Extender: Claudette Laws Physician: Referring Physician: Melina Modena in Treatment: 4 Subjective Chief Complaint Information obtained  from Patient Right anterior calf traumatic hematoma. Cellulitis. History of Present Illness (HPI) Very pleasant 82 year old with history of atrial fibrillation and peripheral vascular disease. 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 Healed as of November 2016. Seen by her cardiologist Dr. Kathlyn [place] in April 2016. Noninvasive vascular evaluation showed an ABI of 0.68 on the right and 0.85 on the left. 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 One-vessel runoff below the knee via the peroneal artery with short occlusion of the anterior tibial artery and reconstitution. No intervention performed. She hit her leg on a wooden duck 2 days ago and noted immediate swelling. This has not significantly increased in size but has become "softer". No drainage. Mild pain. Ambulating per her  baseline. No significant claudication. No antibiotics. No fever or chills. No drainage. Unable to apply her compression stockings. 05/12/2015 -- the culture done last week -- no growth was found. patient already on clindamycin and will complete her course. 05/19/15; the patient is on antibiotics prescribed by her primary physician. This is a wound that was caused by traumatizing her right leg on a wooden duck. She had a hematoma which is since been evacuated. She is using Aquacel Ag and foam but no compression. Her treatment nurse tells me today that there is a reason for the noncompression, apparently the patient did not tolerate them well in the past for reasons that are not clear. She does have venous insufficiency/stasis physiology 05/26/15; once again the wound is fairly liberally covered by a tight fibrinous slough. She does not tolerate debridement with a curette well due to pain even with 4% lidocaine. She has been using Aquacel Ag 06/02/15 again debridement over the wound area surgically to remove tight in his slough and nonviable subcutaneous tissue. Objective Stach, AARIONA URSINI ([id number]) Constitutional Vitals Time Taken: 11:24 AM, Height: 68 in, Weight: 150 lbs, BMI: 22.8, Temperature: 97.7 F, Pulse: 58 bpm, Respiratory Rate: 18 breaths/min, Blood Pressure: 150/58 mmHg. Integumentary (Hair, Skin) Wound #10 status is Open. Original cause of wound was Trauma. The wound is located on the Left Lower Leg. The wound measures 3.8cm length x 3cm width x 0.1cm depth; 8.954cm^2 area and 0.895cm^3 volume. Wound #11 status is Healed - Epithelialized. Original cause of wound was Trauma. The wound is located on the Right,Proximal,Lateral Lower Leg. The wound measures 0cm length x 0cm width x 0cm depth; 0cm^2 area and 0cm^3 volume. The wound is limited to skin breakdown. There is no tunneling or undermining noted. There is a none present amount of drainage noted. The wound margin is flat and  intact. There is no granulation within the wound bed. There is no necrotic tissue within the wound bed. The periwound skin appearance exhibited: Localized Edema, Dry/Scaly. The periwound skin appearance did not exhibit: Callus, Crepitus, Excoriation, Fluctuance, Friable, Induration, Rash, Scarring, Maceration, Moist, Atrophie Blanche, Cyanosis, Ecchymosis, Hemosiderin Staining, Mottled, Pallor, Rubor, Erythema. Periwound temperature was noted as No Abnormality. Assessment Active Problems ICD-10 S81.801A - Unspecified open wound, right lower leg, initial encounter I48.2 - Chronic atrial fibrillation L03.115 - Cellulitis of right lower limb Procedures Wound #10 Wound #10 is a Trauma, Other located on the Left Lower Leg . There was a Skin/Subcutaneous Tissue Debridement [id number]) debridement with total area of 11.4 sq cm performed by Ricard Dillon, MD. with the following instrument(s): Curette to remove Non-Viable tissue/material including Exudate, Fibrin/Slough,  and Subcutaneous after achieving pain control using Lidocaine 4% Topical Solution. A time out was conducted prior to the start of the procedure. A Minimum amount of bleeding was controlled with Pressure. The procedure was tolerated well with a pain level of 0 throughout and a pain level of 0 following the procedure. Post Debridement Measurements: 3.8cm length x 3cm width x 0.2cm depth; 1.791cm^3 volume. Post procedure Diagnosis Wound #10: Same as Pre-Procedure LUANE, LITTIG ([id number]) Plan Wound Cleansing: Wound #10 Left Lower Leg: Cleanse wound with mild soap and water May Shower, gently pat wound dry prior to applying new dressing. May shower with protection. Anesthetic: Wound #10 Left Lower Leg: Topical Lidocaine 4% cream applied to wound bed prior to debridement Primary Wound Dressing: Wound #10 Left Lower Leg: Santyl Ointment Secondary Dressing: Wound #10 Left Lower Leg: Boardered Foam Dressing Dressing Change  Frequency: Wound #10 Left Lower Leg: Change dressing every other day. Follow-up Appointments: Wound #10 Left Lower Leg: Return Appointment in 1 week. Edema Control: Wound #10 Left Lower Leg: Other: - ace wrap on in the morning and off before bed Medications-please add to medication list.: Wound #10 Left Lower Leg: Santyl Enzymatic Ointment #1 we continue the Santyldressings we started last week. Her covering this with foam and then Ace wrapping. They are doing this themselves. #2 it is likely further debridement will need to be done next week. This is certainly not ready for another dressing at this point Electronic Signature(s) Signed: 06/02/2015 5:04:49 PM By: Linton Ham MD [name] ([id number]) Entered By: Linton Ham on 06/02/2015 15:26:44 Kreeger, [name] ([id number]) -------------------------------------------------------------------------------- SuperBill Details Patient Name: [name] Date of Service: 06/02/2015 Medical Record Patient Account Number: [id number] Number: Treating RN: Baruch Gouty, RN, BSN, Rita 08/12/1932 [phone] y.o. Other Clinician: Date of Birth/Sex: Female) Treating ROBSON, MICHAEL Primary Care Physician/Extender: Claudette Laws Physician: Suella Grove in Treatment: 4 Referring Physician: Emily Filbert Diagnosis Coding ICD-10 Codes Code Description [phone] Unspecified open wound, right lower leg, initial encounter I48.2 Chronic atrial fibrillation L03.115 Cellulitis of right lower limb Facility Procedures CPT4 Code: [id number] Description: [id number] - DEB SUBQ TISSUE 20 SQ CM/< ICD-10 Description Diagnosis S81.801A Unspecified open wound, right lower leg, initial Modifier: encounter Quantity: 1 Physician Procedures CPT4 Code: [id number] Description: [id number] - WC PHYS SUBQ TISS 20 SQ CM ICD-10 Description Diagnosis S81.801A Unspecified open wound, right lower leg, initial Modifier: encounter Quantity: 1 Electronic Signature(s) Signed: 06/02/2015  5:04:49 PM By: Linton Ham MD Entered By: Linton Ham on 06/02/2015 15:27:04

## 2015-06-03 NOTE — Progress Notes (Signed)
[name] ([id number]) Visit Report for 06/02/2015 Arrival Information Details Patient Name: [name], [name]. Date of Service: 06/02/2015 10:45 AM Medical Record Patient Account Number: [id number] Number: Treating RN: Baruch Gouty, RN, BSN, Rita 12/01/1932 [phone] y.o. Other Clinician: Date of Birth/Sex: Female) Treating ROBSON, MICHAEL Primary Care Physician/Extender: Claudette Laws Physician: Referring Physician: Melina Modena in Treatment: 4 Visit Information History Since Last Visit Added or deleted any medications: No Patient Arrived: Cane Any new allergies or adverse reactions: No Arrival Time: 11:24 Had a fall or experienced change in No Accompanied By: hubby activities of daily living that may affect Transfer Assistance: None risk of falls: Patient Identification Verified: Yes Signs or symptoms of abuse/neglect since last No Secondary Verification Process Completed: Yes visito Patient Requires Transmission-Based No Hospitalized since last visit: No Precautions: Has Dressing in Place as Prescribed: Yes Patient Has Alerts: Yes Pain Present Now: No Patient Alerts: ASA Electronic Signature(s) Signed: 06/02/2015 5:09:16 PM By: Regan Lemming BSN, RN Entered By: Regan Lemming on 06/02/2015 11:24:56 [name] ([id number]) -------------------------------------------------------------------------------- Encounter Discharge Information Details Patient Name: [name]. Date of Service: 06/02/2015 10:45 AM Medical Record Patient Account Number: [id number] Number: Treating RN: Baruch Gouty, RN, BSN, Rita [birth year] [phone] y.o. Other Clinician: Date of Birth/Sex: Female) Treating ROBSON, MICHAEL Primary Care Physician/Extender: Claudette Laws Physician: Referring Physician: Melina Modena in Treatment: 4 Encounter Discharge Information Items Discharge Pain Level: 0 Discharge Condition: Stable Ambulatory Status: Cane Discharge Destination: Home Transportation:  Private Auto Accompanied By: husband Schedule Follow-up Appointment: No Medication Reconciliation completed No and provided to Patient/Care [name]: Provided on Clinical Summary of Care: 06/02/2015 Form Type Recipient Paper Patient JW Electronic Signature(s) Signed: 06/02/2015 11:58:31 AM By: Ruthine Dose Entered By: Ruthine Dose on 06/02/2015 11:58:31 [name] ([id number]) -------------------------------------------------------------------------------- Lower Extremity Assessment Details Patient Name: [name]. Date of Service: 06/02/2015 10:45 AM Medical Record Patient Account Number: [id number] Number: Treating RN: Baruch Gouty, RN, BSN, Rita 1933-01-27 [phone] y.o. Other Clinician: Date of Birth/Sex: Female) Treating ROBSON, MICHAEL Primary Care Physician/Extender: Claudette Laws Physician: Referring Physician: Melina Modena in Treatment: 4 Edema Assessment Assessed: [Left: No] [Right: No] E[Left: dema] [Right: :] Calf Left: Right: Point of Measurement: 34 cm From Medial Instep 31.3 cm cm Ankle Left: Right: Point of Measurement: 10 cm From Medial Instep 22.2 cm cm Vascular Assessment Claudication: Claudication Assessment [Left:None] Pulses: Posterior Tibial Dorsalis Pedis Palpable: [Left:Yes] Extremity colors, hair growth, and conditions: Extremity Color: [Left:Mottled] Hair Growth on Extremity: [Left:No] Temperature of Extremity: [Left:Warm] Capillary Refill: [Left:< 3 seconds] Electronic Signature(s) Signed: 06/02/2015 5:09:16 PM By: Regan Lemming BSN, RN Entered By: Regan Lemming on 06/02/2015 11:20:55 [name] ([id number]) -------------------------------------------------------------------------------- Multi Wound Chart Details Patient Name: [name]. Date of Service: 06/02/2015 10:45 AM Medical Record Patient Account Number: [id number] Number: Treating RN: Baruch Gouty, RN, BSN, Rita 04-Apr-1933 [phone] y.o. Other Clinician: Date of  Birth/Sex: Female) Treating ROBSON, MICHAEL Primary Care Physician/Extender: Claudette Laws Physician: Referring Physician: Melina Modena in Treatment: 4 Vital Signs Height(in): 68 Pulse(bpm): 58 Weight(lbs): 150 Blood Pressure 150/58 (mmHg): Body Mass Index(BMI): 23 Temperature(F): 97.7 Respiratory Rate 18 (breaths/min): Photos: [10:No Photos] [11:No Photos] [N/A:N/A] Wound Location: [10:Left Lower Leg] [11:Right Lower Leg - Lateral, N/A Proximal] Wounding Event: [10:Trauma] [11:Trauma] [N/A:N/A] Primary Etiology: [10:Trauma, Other] [11:Trauma, Other] [N/A:N/A] Comorbid History: [10:N/A] [11:Cataracts, Anemia, Arrhythmia, Hypertension, Osteoarthritis, Received Radiation] [N/A:N/A] Date Acquired: [10:04/30/2015] [11:05/25/2015] [N/A:N/A] Weeks of Treatment: [10:4] [11:1] [N/A:N/A] Wound Status: [10:Open] [11:Healed - Epithelialized] [N/A:N/A]  Measurements L x W x D 3.8x3x0.1 [11:0x0x0] [N/A:N/A] (cm) Area (cm) : [10:8.954] [11:0] [N/A:N/A] Volume (cm) : [10:0.895] [11:0] [N/A:N/A] % Reduction in Area: [10:49.30%] [11:100.00%] [N/A:N/A] % Reduction in Volume: 49.30% [11:100.00%] [N/A:N/A] Classification: [10:Partial Thickness] [11:Partial Thickness] [N/A:N/A] Exudate Amount: [10:N/A] [11:None Present] [N/A:N/A] Wound Margin: [10:N/A] [11:Flat and Intact] [N/A:N/A] Granulation Amount: [10:N/A] [11:None Present (0%)] [N/A:N/A] Necrotic Amount: [10:N/A] [11:None Present (0%)] [N/A:N/A] Epithelialization: [10:N/A] [11:None] [N/A:N/A] Periwound Skin Texture: No Abnormalities Noted [11:Edema: Yes Excoriation: No Induration: No] [N/A:N/A] Callus: No Crepitus: No Fluctuance: No Friable: No Rash: No Scarring: No Periwound Skin No Abnormalities Noted Dry/Scaly: Yes N/A Moisture: Maceration: No Moist: No Periwound Skin Color: No Abnormalities Noted Atrophie Blanche: No N/A Cyanosis: No Ecchymosis: No Erythema: No Hemosiderin Staining: No Mottled: No Pallor:  No Rubor: No Temperature: N/A No Abnormality N/A Tenderness on No No N/A Palpation: Wound Preparation: N/A Ulcer Cleansing: N/A Rinsed/Irrigated with Saline Topical Anesthetic Applied: None Treatment Notes Electronic Signature(s) Signed: 06/02/2015 5:09:16 PM By: Regan Lemming BSN, RN Entered By: Regan Lemming on 06/02/2015 11:48:07 Nabers, Derry Bailey ([id number]) -------------------------------------------------------------------------------- [place] Details Patient Name: [name]. Date of Service: 06/02/2015 10:45 AM Medical Record Patient Account Number: [id number] Number: Treating RN: Baruch Gouty, RN, BSN, Rita May 14, 1932 [phone] y.o. Other Clinician: Date of Birth/Sex: Female) Treating ROBSON, MICHAEL Primary Care Physician/Extender: Claudette Laws Physician: Referring Physician: Melina Modena in Treatment: 4 Active Inactive Electronic Signature(s) Signed: 06/02/2015 5:09:16 PM By: Regan Lemming BSN, RN Previous Signature: 06/02/2015 11:32:57 AM Version By: Regan Lemming BSN, RN Entered By: Regan Lemming on 06/02/2015 11:47:59 Lundblad, Derry Bailey ([id number]) -------------------------------------------------------------------------------- Pain Assessment Details Patient Name: [name]. Date of Service: 06/02/2015 10:45 AM Medical Record Patient Account Number: [id number] Number: Treating RN: Baruch Gouty, RN, BSN, Rita 27-May-1932 [phone] y.o. Other Clinician: Date of Birth/Sex: Female) Treating ROBSON, MICHAEL Primary Care Physician/Extender: Claudette Laws Physician: Referring Physician: Melina Modena in Treatment: 4 Active Problems Location of Pain Severity and Description of Pain Patient Has Paino No Site Locations Pain Management and Medication Current Pain Management: Electronic Signature(s) Signed: 06/02/2015 5:09:16 PM By: Regan Lemming BSN, RN Entered By: Regan Lemming on 06/02/2015 11:21:04 Dibiasio, Derry Bailey  ([id number]) -------------------------------------------------------------------------------- Patient/Caregiver Education Details Patient Name: [name]. Date of Service: 06/02/2015 10:45 AM Medical Record Patient Account Number: [id number] Number: Treating RN: Baruch Gouty, RN, BSN, Rita 27-May-1932 [phone] y.o. Other Clinician: Date of Birth/Gender: Female) Treating ROBSON, MICHAEL Primary Care Physician/Extender: Claudette Laws Physician: Suella Grove in Treatment: 4 Referring Physician: Emily Filbert Education Assessment Education Provided To: Patient Education Topics Provided Basic Hygiene: Methods: Explain/Verbal Responses: State content correctly Wound/Skin Impairment: Methods: Explain/Verbal Responses: State content correctly Electronic Signature(s) Signed: 06/02/2015 5:09:16 PM By: Regan Lemming BSN, RN Entered By: Regan Lemming on 06/02/2015 11:50:57 Heaton, Derry Bailey ([id number]) -------------------------------------------------------------------------------- Wound Assessment Details Patient Name: [name]. Date of Service: 06/02/2015 10:45 AM Medical Record Patient Account Number: [id number] Number: Treating RN: Baruch Gouty, RN, BSN, Rita Apr 26, 1932 [phone] y.o. Other Clinician: Date of Birth/Sex: Female) Treating ROBSON, MICHAEL Primary Care Physician/Extender: Claudette Laws Physician: Referring Physician: Melina Modena in Treatment: 4 Wound Status Wound Number: 10 Primary Etiology: Trauma, Other Wound Location: Left Lower Leg Wound Status: Open Wounding Event: Trauma Date Acquired: 04/30/2015 Weeks Of Treatment: 4 Clustered Wound: No Photos Photo Uploaded By: Regan Lemming on 06/02/2015 14:36:01 Wound Measurements Length: (cm) 3.8 Width: (cm) 3 Depth: (cm) 0.1 Area: (cm) 8.954 Volume: (cm) 0.895 % Reduction in Area: 49.3% %  Reduction in Volume: 49.3% Wound Description Classification: Partial Thickness Periwound Skin Texture Texture Color No  Abnormalities Noted: No No Abnormalities Noted: No Moisture No Abnormalities Noted: No Lisenbee, Stevee A. ([id number]) Treatment Notes Wound #10 (Left Lower Leg) 1. Cleansed with: Clean wound with Normal Saline 2. Anesthetic Topical Lidocaine 4% cream to wound bed prior to debridement 4. Dressing Applied: Santyl Ointment 5. Secondary Dressing Applied Bordered Foam Dressing Dry Gauze Notes ace wrap Electronic Signature(s) Signed: 06/02/2015 5:09:16 PM By: Regan Lemming BSN, RN Entered By: Regan Lemming on 06/02/2015 11:26:30 Bruyere, Derry Bailey ([id number]) -------------------------------------------------------------------------------- Wound Assessment Details Patient Name: [name]. Date of Service: 06/02/2015 10:45 AM Medical Record Patient Account Number: [id number] Number: Treating RN: Baruch Gouty, RN, BSN, Rita [birth year] [phone] y.o. Other Clinician: Date of Birth/Sex: Female) Treating ROBSON, MICHAEL Primary Care Physician/Extender: Claudette Laws Physician: Referring Physician: Melina Modena in Treatment: 4 Wound Status Wound Number: 11 Primary Trauma, Other Etiology: Wound Location: Right Lower Leg - Lateral, Proximal Wound Healed - Epithelialized Status: Wounding Event: Trauma Comorbid Cataracts, Anemia, Arrhythmia, Date Acquired: 05/25/2015 History: Hypertension, Osteoarthritis, Received Weeks Of Treatment: 1 Radiation Clustered Wound: No Photos Photo Uploaded By: Regan Lemming on 06/02/2015 14:36:42 Wound Measurements Length: (cm) 0 % Reduction Width: (cm) 0 % Reduction Depth: (cm) 0 Epithelializ Area: (cm) 0 Tunneling: Volume: (cm) 0 Undermining in Area: 100% in Volume: 100% ation: None No : No Wound Description Classification: Partial Thickness Wound Margin: Flat and Intact Exudate Amount: None Present Foul Odor After Cleansing: No Wound Bed Granulation Amount: None Present (0%) Exposed Structure Necrotic Amount: None Present (0%) Fascia Exposed:  No Fat Layer Exposed: No Kandel, Rachelann A. ([id number]) Tendon Exposed: No Muscle Exposed: No Joint Exposed: No Bone Exposed: No Limited to Skin Breakdown Periwound Skin Texture Texture Color No Abnormalities Noted: No No Abnormalities Noted: No Callus: No Atrophie Blanche: No Crepitus: No Cyanosis: No Excoriation: No Ecchymosis: No Fluctuance: No Erythema: No Friable: No Hemosiderin Staining: No Induration: No Mottled: No Localized Edema: Yes Pallor: No Rash: No Rubor: No Scarring: No Temperature / Pain Moisture Temperature: No Abnormality No Abnormalities Noted: No Dry / Scaly: Yes Maceration: No Moist: No Wound Preparation Ulcer Cleansing: Rinsed/Irrigated with Saline Topical Anesthetic Applied: None Electronic Signature(s) Signed: 06/02/2015 11:32:22 AM By: Regan Lemming BSN, RN Entered By: Regan Lemming on 06/02/2015 11:32:22 Woldt, Derry Bailey ([id number]) -------------------------------------------------------------------------------- Vitals Details Patient Name: [name]. Date of Service: 06/02/2015 10:45 AM Medical Record Patient Account Number: [id number] Number: Treating RN: Baruch Gouty, RN, BSN, Rita [birth year] [phone] y.o. Other Clinician: Date of Birth/Sex: Female) Treating ROBSON, MICHAEL Primary Care Physician/Extender: Claudette Laws Physician: Referring Physician: Melina Modena in Treatment: 4 Vital Signs Time Taken: 11:24 Temperature (F): 97.7 Height (in): 68 Pulse (bpm): 58 Weight (lbs): 150 Respiratory Rate (breaths/min): 18 Body Mass Index (BMI): 22.8 Blood Pressure (mmHg): 150/58 Reference Range: 80 - 120 mg / dl Electronic Signature(s) Signed: 06/02/2015 5:09:16 PM By: Regan Lemming BSN, RN Entered By: Regan Lemming on 06/02/2015 11:25:29

## 2015-06-07 ENCOUNTER — Other Ambulatory Visit: Payer: Self-pay | Admitting: Internal Medicine

## 2015-06-07 ENCOUNTER — Ambulatory Visit
Admission: RE | Admit: 2015-06-07 | Discharge: 2015-06-07 | Disposition: A | Discharge status: Home / Self Care | Payer: PPO | Source: Ambulatory Visit | Attending: Internal Medicine | Admitting: Internal Medicine

## 2015-06-07 DIAGNOSIS — R55 Syncope and collapse: Secondary | ICD-10-CM | POA: Diagnosis not present

## 2015-06-07 DIAGNOSIS — G319 Degenerative disease of nervous system, unspecified: Secondary | ICD-10-CM | POA: Insufficient documentation

## 2015-06-07 DIAGNOSIS — I5043 Acute on chronic combined systolic (congestive) and diastolic (congestive) heart failure: Secondary | ICD-10-CM | POA: Diagnosis not present

## 2015-06-07 DIAGNOSIS — G44311 Acute post-traumatic headache, intractable: Secondary | ICD-10-CM | POA: Diagnosis not present

## 2015-06-07 DIAGNOSIS — I739 Peripheral vascular disease, unspecified: Secondary | ICD-10-CM | POA: Insufficient documentation

## 2015-06-07 DIAGNOSIS — R296 Repeated falls: Secondary | ICD-10-CM | POA: Diagnosis not present

## 2015-06-09 ENCOUNTER — Encounter: Payer: PPO | Admitting: Internal Medicine

## 2015-06-09 DIAGNOSIS — S81801A Unspecified open wound, right lower leg, initial encounter: Secondary | ICD-10-CM | POA: Diagnosis not present

## 2015-06-10 NOTE — Progress Notes (Signed)
ANYSA, IVERS ([id number]) Visit Report for 06/09/2015 Chief Complaint Document Details Patient Name: [name], [name]. Date of Service: 06/09/2015 2:15 PM Medical Record Patient Account Number: [id number] Number: Treating RN: Baruch Gouty, RN, BSN, Rita [birth year] [phone] y.o. Other Clinician: Date of Birth/Sex: Female) Treating Jurell Basista Primary Care Physician/Extender: Claudette Laws Physician: Referring Physician: Melina Modena in Treatment: 5 Information Obtained from: Patient Chief Complaint Right anterior calf traumatic hematoma. Cellulitis. Electronic Signature(s) Signed: 06/09/2015 5:19:28 PM By: Linton Ham MD Entered By: Linton Ham on 06/09/2015 16:57:53 Biello, [name] ([id number]) -------------------------------------------------------------------------------- Debridement Details Patient Name: [name]. Date of Service: 06/09/2015 2:15 PM Medical Record Patient Account Number: [id number] Number: Treating RN: Baruch Gouty, RN, BSN, Rita [birth year] [phone] y.o. Other Clinician: Date of Birth/Sex: Female) Treating Khing Belcher Primary Care Physician/Extender: Claudette Laws Physician: Referring Physician: Melina Modena in Treatment: 5 Debridement Performed for Wound #10 Left Lower Leg Assessment: Performed By: Physician Ricard Dillon, MD Debridement: Open Wound/Selective Debridement Selective Description: Pre-procedure Yes Verification/Time Out Taken: Start Time: 14:39 Pain Control: Lidocaine 4% Topical Solution Level: Non-Viable Tissue Total Area Debrided (L x 3.1 (cm) x 2.4 (cm) = 7.44 (cm) W): Tissue and other Non-Viable, Fibrin/Slough, Subcutaneous material debrided: Instrument: Curette Bleeding: Minimum Hemostasis Achieved: Pressure End Time: 14:42 Procedural Pain: 0 Post Procedural Pain: 0 Response to Treatment: Procedure was tolerated well Post Debridement Measurements of Total Wound Length: (cm) 3.1 Width: (cm)  2.4 Depth: (cm) 0.1 Volume: (cm) 0.584 Post Procedure Diagnosis Same as Pre-procedure Electronic Signature(s) Signed: 06/09/2015 5:07:22 PM By: Regan Lemming BSN, RN Signed: 06/09/2015 5:19:28 PM By: Linton Ham MD [name] ([id number]) Entered By: Linton Ham on 06/09/2015 16:57:38 Garlick, [name] ([id number]) -------------------------------------------------------------------------------- HPI Details Patient Name: [name]. Date of Service: 06/09/2015 2:15 PM Medical Record Patient Account Number: [id number] Number: Treating RN: Baruch Gouty, RN, BSN, Rita 1932/10/09 [phone] y.o. Other Clinician: Date of Birth/Sex: Female) Treating Jolleen Seman Primary Care Physician/Extender: Claudette Laws Physician: Referring Physician: Melina Modena in Treatment: 5 History of Present Illness HPI Description: Very pleasant 82 year old with history of atrial fibrillation and peripheral vascular disease. 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 Healed as of November 2016. Seen by her cardiologist Dr. Kathlyn [place] in April 2016. Noninvasive vascular evaluation showed an ABI of 0.68 on the right and 0.85 on the left. 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 One-vessel runoff below the knee via the peroneal artery with short occlusion of the anterior tibial artery and reconstitution. No intervention performed. She hit her leg on a wooden duck 2 days ago and noted immediate swelling. This has not significantly increased in size but has become "softer". No drainage. Mild pain. Ambulating per her baseline. No significant claudication. No antibiotics. No fever or chills. No drainage. Unable to apply her compression stockings. 05/12/2015 -- the culture done last week -- no growth was found. patient already on clindamycin and  will complete her course. 05/19/15; the patient is on antibiotics prescribed by her primary physician. This is a wound that was caused by traumatizing her right leg on a wooden duck. She had a hematoma which is since been evacuated. She is using Aquacel Ag and foam but no compression. Her treatment nurse tells me today that there is a reason for the noncompression, apparently the patient did not tolerate them well  in the past for reasons that are not clear. She does have venous insufficiency/stasis physiology 05/26/15; once again the wound is fairly liberally covered by a tight fibrinous slough. She does not tolerate debridement with a curette well due to pain even with 4% lidocaine. She has been using Aquacel Ag 06/02/15 again debridement over the wound area surgically to remove tight in his slough and nonviable subcutaneous tissue. 06/09/15 the patient arrived with her wound and in much better condition. She has islands of epithelialization. The rest of this required a selective debridement however that nonviable surface slough comes off easily with a curette. There is no evidence of infection Electronic Signature(s) Signed: 06/09/2015 5:19:28 PM By: Linton Ham MD Entered By: Linton Ham on 06/09/2015 16:58:37 Hetz, [name] ([id number]) -------------------------------------------------------------------------------- Physical Exam Details Patient Name: [name]. Date of Service: 06/09/2015 2:15 PM Medical Record Patient Account Number: [id number] Number: Treating RN: Baruch Gouty, RN, BSN, Rita 1932/09/14 [phone] y.o. Other Clinician: Date of Birth/Sex: Female) Treating Rashana Andrew Primary Care Physician/Extender: Claudette Laws Physician: Referring Physician: Melina Modena in Treatment: 5 Notes Wound exam; easy debridement of the wound on the right anterior calf. Selective debridement to. Her edema control is really quite good. Irregular epithelialization noted including  an [place] in the middle and to [place]'s coming off of this at 10 and 6:00 Electronic Signature(s) Signed: 06/09/2015 5:19:28 PM By: Linton Ham MD Entered By: Linton Ham on 06/09/2015 16:59:17 Betters, [name] ([id number]) -------------------------------------------------------------------------------- Physician Orders Details Patient Name: [name]. Date of Service: 06/09/2015 2:15 PM Medical Record Patient Account Number: [id number] Number: Treating RN: Baruch Gouty, RN, BSN, Rita [birth year] [phone] y.o. Other Clinician: Date of Birth/Sex: Female) Treating Naveah Brave Primary Care Physician/Extender: Claudette Laws Physician: Referring Physician: Melina Modena in Treatment: 5 Verbal / Phone Orders: Yes Clinician: Afful, RN, BSN, Rita Read Back and Verified: Yes Diagnosis Coding Wound Cleansing Wound #10 Left Lower Leg o Cleanse wound with mild soap and water o May Shower, gently pat wound dry prior to applying new dressing. o May shower with protection. Anesthetic Wound #10 Left Lower Leg o Topical Lidocaine 4% cream applied to wound bed prior to debridement Primary Wound Dressing Wound #10 Left Lower Leg o Santyl Ointment Secondary Dressing Wound #10 Left Lower Leg o Boardered Foam Dressing Dressing Change Frequency Wound #10 Left Lower Leg o Change dressing every other day. Follow-up Appointments Wound #10 Left Lower Leg o Return Appointment in 1 week. Edema Control Wound #10 Left Lower Leg o Other: - ace wrap on in the morning and off before bed Medications-please add to medication list. Wound #10 Left Lower Leg ZENA, WEERTS ([id number]) o Santyl Enzymatic Ointment Electronic Signature(s) Signed: 06/09/2015 5:07:22 PM By: Regan Lemming BSN, RN Signed: 06/09/2015 5:19:28 PM By: Linton Ham MD Entered By: Regan Lemming on 06/09/2015 14:41:23 Hosier, [name]  ([id number]) -------------------------------------------------------------------------------- Problem List Details Patient Name: [name]. Date of Service: 06/09/2015 2:15 PM Medical Record Patient Account Number: [id number] Number: Treating RN: Baruch Gouty, RN, BSN, Rita 27-Jun-1932 [phone] y.o. Other Clinician: Date of Birth/Sex: Female) Treating Issaac Shipper Primary Care Physician/Extender: Claudette Laws Physician: Referring Physician: Melina Modena in Treatment: 5 Active Problems ICD-10 Encounter Code Description Active Date Diagnosis S81.801A Unspecified open wound, right lower leg, initial encounter 05/05/2015 Yes I48.2 Chronic atrial fibrillation 05/05/2015 Yes L03.115 Cellulitis of right lower limb 05/05/2015 Yes Inactive Problems Resolved Problems Electronic Signature(s) Signed: 06/09/2015 5:19:28 PM By: Linton Ham MD Entered By:  Linton Ham on 06/09/2015 16:57:13 Tumolo, [name] ([id number]) -------------------------------------------------------------------------------- Progress Note Details Patient Name: JADORA, CARRAHER. Date of Service: 06/09/2015 2:15 PM Medical Record Patient Account Number: [id number] Number: Treating RN: Baruch Gouty, RN, BSN, Rita 1932/04/29 [phone] y.o. Other Clinician: Date of Birth/Sex: Female) Treating Akaisha Truman Primary Care Physician/Extender: Claudette Laws Physician: Referring Physician: Melina Modena in Treatment: 5 Subjective Chief Complaint Information obtained from Patient Right anterior calf traumatic hematoma. Cellulitis. History of Present Illness (HPI) Very pleasant 82 year old with history of atrial fibrillation and peripheral vascular disease. 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 Healed as of November 2016. Seen by her cardiologist Dr. Kathlyn Sacramento in April 2016. Noninvasive vascular evaluation showed an ABI of  0.68 on the right and 0.85 on the left. 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 One-vessel runoff below the knee via the peroneal artery with short occlusion of the anterior tibial artery and reconstitution. No intervention performed. She hit her leg on a wooden duck 2 days ago and noted immediate swelling. This has not significantly increased in size but has become "softer". No drainage. Mild pain. Ambulating per her baseline. No significant claudication. No antibiotics. No fever or chills. No drainage. Unable to apply her compression stockings. 05/12/2015 -- the culture done last week -- no growth was found. patient already on clindamycin and will complete her course. 05/19/15; the patient is on antibiotics prescribed by her primary physician. This is a wound that was caused by traumatizing her right leg on a wooden duck. She had a hematoma which is since been evacuated. She is using Aquacel Ag and foam but no compression. Her treatment nurse tells me today that there is a reason for the noncompression, apparently the patient did not tolerate them well in the past for reasons that are not clear. She does have venous insufficiency/stasis physiology 05/26/15; once again the wound is fairly liberally covered by a tight fibrinous slough. She does not tolerate debridement with a curette well due to pain even with 4% lidocaine. She has been using Aquacel Ag 06/02/15 again debridement over the wound area surgically to remove tight in his slough and nonviable subcutaneous tissue. 06/09/15 the patient arrived with her wound and in much better condition. She has islands of epithelialization. The rest of this required a selective debridement however that nonviable surface slough comes off easily with a curette. There is no evidence of infection Mcgurk, Joelyn A. ([id number]) Objective Constitutional Vitals Time Taken: 2:22 PM, Height: 68  in, Weight: 150 lbs, BMI: 22.8, Temperature: 98.0 F, Pulse: 56 bpm, Respiratory Rate: 18 breaths/min, Blood Pressure: 142/40 mmHg. Integumentary (Hair, Skin) Wound #10 status is Open. Original cause of wound was Trauma. The wound is located on the Left Lower Leg. The wound measures 3.1cm length x 2.4cm width x 0.1cm depth; 5.843cm^2 area and 0.584cm^3 volume. The wound is limited to skin breakdown. There is no tunneling or undermining noted. There is a medium amount of serosanguineous drainage noted. The wound margin is distinct with the outline attached to the wound base. There is large (67-100%) granulation within the wound bed. There is a small (1-33%) amount of necrotic tissue within the wound bed including Adherent Slough. The periwound skin appearance exhibited: Moist. The periwound skin appearance did not exhibit: Callus, Crepitus, Excoriation, Fluctuance, Friable, Induration, Localized Edema, Rash, Scarring, Dry/Scaly, Maceration, Atrophie Blanche, Cyanosis, Ecchymosis,  Hemosiderin Staining, Mottled, Pallor, Rubor, Erythema. Periwound temperature was noted as No Abnormality. Assessment Active Problems ICD-10 S81.801A - Unspecified open wound, right lower leg, initial encounter I48.2 - Chronic atrial fibrillation L03.115 - Cellulitis of right lower limb Procedures Wound #10 Wound #10 is a Trauma, Other located on the Left Lower Leg . There was a Non-Viable Tissue Open Wound/Selective [phone]) debridement with total area of 7.44 sq cm performed by Ricard Dillon, MD. with the following instrument(s): Curette to remove Non-Viable tissue/material including Fibrin/Slough and Subcutaneous after achieving pain control using Lidocaine 4% Topical Solution. A time out was conducted prior to the start of the procedure. A Minimum amount of bleeding was controlled with Pressure. The procedure was tolerated well with a pain level of 0 throughout and a pain level of 0 following the  procedure. Post Debridement Measurements: 3.1cm length x 2.4cm width x 0.1cm depth; 0.584cm^3 Poplin, Seraphine A. ([id number]) volume. Post procedure Diagnosis Wound #10: Same as Pre-Procedure Plan Wound Cleansing: Wound #10 Left Lower Leg: Cleanse wound with mild soap and water May Shower, gently pat wound dry prior to applying new dressing. May shower with protection. Anesthetic: Wound #10 Left Lower Leg: Topical Lidocaine 4% cream applied to wound bed prior to debridement Primary Wound Dressing: Wound #10 Left Lower Leg: Santyl Ointment Secondary Dressing: Wound #10 Left Lower Leg: Boardered Foam Dressing Dressing Change Frequency: Wound #10 Left Lower Leg: Change dressing every other day. Follow-up Appointments: Wound #10 Left Lower Leg: Return Appointment in 1 week. Edema Control: Wound #10 Left Lower Leg: Other: - ace wrap on in the morning and off before bed Medications-please add to medication list.: Wound #10 Left Lower Leg: Santyl Enzymatic Ointment #1 we continued with the Santyl based dressings here, foam cover change every 2 days Electronic Signature(s) Signed: 06/09/2015 5:19:28 PM By: Linton Ham MD Entered By: Linton Ham on 06/09/2015 16:59:43 Hartsfield, [name] ([id number]) Ochsner, [name] ([id number]) -------------------------------------------------------------------------------- SuperBill Details Patient Name: [name] Date of Service: 06/09/2015 Medical Record Patient Account Number: [id number] Number: Treating RN: Baruch Gouty, RN, BSN, Rita [birth year] [phone] y.o. Other Clinician: Date of Birth/Sex: Female) Treating Tallan Sandoz Primary Care Physician/Extender: Claudette Laws Physician: Suella Grove in Treatment: 5 Referring Physician: Emily Filbert Diagnosis Coding ICD-10 Codes Code Description [phone] Unspecified open wound, right lower leg, initial encounter I48.2 Chronic atrial fibrillation L03.115 Cellulitis of right lower limb Facility  Procedures CPT4 Code: [id number] Description: [phone] - DEBRIDE WOUND 1ST 20 SQ CM OR < ICD-10 Description Diagnosis S81.801A Unspecified open wound, right lower leg, initial Modifier: encounter Quantity: 1 Physician Procedures CPT4 Code: [id number] Description: [id number] - WC PHYS DEBR WO ANESTH 20 SQ CM ICD-10 Description Diagnosis S81.801A Unspecified open wound, right lower leg, initial e Modifier: ncounter Quantity: 1 Electronic Signature(s) Signed: 06/09/2015 5:19:28 PM By: Linton Ham MD Entered By: Linton Ham on 06/09/2015 17:00:16

## 2015-06-10 NOTE — Progress Notes (Signed)
[name] ([id number]) Visit Report for 06/09/2015 Arrival Information Details Patient Name: [name], [name]. Date of Service: 06/09/2015 2:15 PM Medical Record Patient Account Number: [id number] Number: Treating RN: Baruch Gouty, RN, BSN, Rita Sep 18, 1932 [phone] y.o. Other Clinician: Date of Birth/Sex: Female) Treating ROBSON, MICHAEL Primary Care Physician/Extender: Claudette Laws Physician: Referring Physician: Melina Modena in Treatment: 5 Visit Information History Since Last Visit Added or deleted any medications: No Patient Arrived: Ambulatory Any new allergies or adverse reactions: No Arrival Time: 14:21 Had a fall or experienced change in No Accompanied By: hubby activities of daily living that may affect Transfer Assistance: None risk of falls: Patient Identification Verified: Yes Signs or symptoms of abuse/neglect since last No Secondary Verification Process Yes visito Completed: Hospitalized since last visit: No Patient Requires Transmission-Based No Has Dressing in Place as Prescribed: Yes Precautions: Pain Present Now: No Patient Has Alerts: Yes Patient Alerts: ASA Electronic Signature(s) Signed: 06/09/2015 5:07:22 PM By: Regan Lemming BSN, RN Entered By: Regan Lemming on 06/09/2015 14:22:12 [name], [name] ([id number]) -------------------------------------------------------------------------------- Encounter Discharge Information Details Patient Name: [name]. Date of Service: 06/09/2015 2:15 PM Medical Record Patient Account Number: [id number] Number: Treating RN: Baruch Gouty, RN, BSN, Rita [birth year] [phone] y.o. Other Clinician: Date of Birth/Sex: Female) Treating ROBSON, MICHAEL Primary Care Physician/Extender: Claudette Laws Physician: Referring Physician: Melina Modena in Treatment: 5 Encounter Discharge Information Items Discharge Pain Level: 0 Discharge Condition: Stable Ambulatory Status: Ambulatory Discharge Destination:  Home Transportation: Private Auto Accompanied By: Micheline Rough Schedule Follow-up Appointment: No Medication Reconciliation completed and provided to Patient/Care No [name]: Provided on Clinical Summary of Care: 06/09/2015 Form Type Recipient Paper Patient JW Electronic Signature(s) Signed: 06/09/2015 5:07:22 PM By: Regan Lemming BSN, RN Previous Signature: 06/09/2015 2:48:15 PM Version By: Ruthine Dose Entered By: Regan Lemming on 06/09/2015 14:52:35 [name], [name] ([id number]) -------------------------------------------------------------------------------- Lower Extremity Assessment Details Patient Name: [name]. Date of Service: 06/09/2015 2:15 PM Medical Record Patient Account Number: [id number] Number: Treating RN: Baruch Gouty, RN, BSN, Rita 01/03/1933 [phone] y.o. Other Clinician: Date of Birth/Sex: Female) Treating ROBSON, MICHAEL Primary Care Physician/Extender: Claudette Laws Physician: Referring Physician: Melina Modena in Treatment: 5 Edema Assessment Assessed: [Left: No] [Right: No] E[Left: dema] [Right: :] Calf Left: Right: Point of Measurement: 34 cm From Medial Instep cm 31.3 cm Ankle Left: Right: Point of Measurement: 10 cm From Medial Instep cm 22 cm Vascular Assessment Claudication: Claudication Assessment [Right:None] Pulses: Posterior Tibial Dorsalis Pedis Palpable: [Right:Yes] Extremity colors, hair growth, and conditions: Extremity Color: [Right:Mottled] Hair Growth on Extremity: [Right:No] Temperature of Extremity: [Right:Warm] Toe Nail Assessment Left: Right: Thick: No Discolored: No Deformed: No Improper Length and Hygiene: No Electronic Signature(s) Signed: 06/09/2015 5:07:22 PM By: Regan Lemming BSN, RN [name], [name] ([id number]) Entered By: Regan Lemming on 06/09/2015 14:27:32 [name], [name] ([id number]) -------------------------------------------------------------------------------- Multi Wound Chart Details Patient Name: [name]. Date of  Service: 06/09/2015 2:15 PM Medical Record Patient Account Number: [id number] Number: Treating RN: Baruch Gouty, RN, BSN, Rita May 12, 1932 [phone] y.o. Other Clinician: Date of Birth/Sex: Female) Treating ROBSON, MICHAEL Primary Care Physician/Extender: Claudette Laws Physician: Referring Physician: Melina Modena in Treatment: 5 Vital Signs Height(in): 68 Pulse(bpm): 56 Weight(lbs): 150 Blood Pressure 142/40 (mmHg): Body Mass Index(BMI): 23 Temperature(F): 98.0 Respiratory Rate 18 (breaths/min): Photos: [10:No Photos] [N/A:N/A] Wound Location: [10:Left Lower Leg] [N/A:N/A] Wounding Event: [10:Trauma] [N/A:N/A] Primary Etiology: [10:Trauma, Other] [N/A:N/A] Comorbid History: [10:Cataracts, Anemia, Arrhythmia, Hypertension, Osteoarthritis, Received Radiation] [N/A:N/A] Date Acquired: [10:04/30/2015] [N/A:N/A]  Weeks of Treatment: [10:5] [N/A:N/A] Wound Status: [10:Open] [N/A:N/A] Measurements L x W x D 3.1x2.4x0.1 [N/A:N/A] (cm) Area (cm) : [10:5.843] [N/A:N/A] Volume (cm) : [10:0.584] [N/A:N/A] % Reduction in Area: [10:66.90%] [N/A:N/A] % Reduction in Volume: 66.90% [N/A:N/A] Classification: [10:Full Thickness Without Exposed Support Structures] [N/A:N/A] Exudate Amount: [10:Medium] [N/A:N/A] Exudate Type: [10:Serosanguineous] [N/A:N/A] Exudate Color: [10:red, brown] [N/A:N/A] Wound Margin: [10:Distinct, outline attached N/A] Granulation Amount: [10:Large (67-100%)] [N/A:N/A] Necrotic Amount: [10:Small (1-33%)] [N/A:N/A] Exposed Structures: [N/A:N/A] Fascia: No Fat: No Tendon: No Muscle: No Joint: No Bone: No Limited to Skin Breakdown Epithelialization: None N/A N/A Debridement: Open Wound/Selective N/A N/A [id number]) - Selective Time-Out Taken: Yes N/A N/A Pain Control: Lidocaine 4% Topical N/A N/A Solution Tissue Debrided: Fibrin/Slough, N/A N/A Subcutaneous Level: Non-Viable Tissue N/A N/A Debridement Area (sq 7.44 N/A N/A cm): Instrument:  Curette N/A N/A Bleeding: Minimum N/A N/A Hemostasis Achieved: Pressure N/A N/A Procedural Pain: 0 N/A N/A Post Procedural Pain: 0 N/A N/A Debridement Treatment Procedure was tolerated N/A N/A Response: well Post Debridement 3.1x2.4x0.1 N/A N/A Measurements L x W x D (cm) Post Debridement 0.584 N/A N/A Volume: (cm) Periwound Skin Texture: Edema: No N/A N/A Excoriation: No Induration: No Callus: No Crepitus: No Fluctuance: No Friable: No Rash: No Scarring: No Periwound Skin Moist: Yes N/A N/A Moisture: Maceration: No Dry/Scaly: No Periwound Skin Color: Atrophie Blanche: No N/A N/A Cyanosis: No Ecchymosis: No Erythema: No Hemosiderin Staining: No Mottled: No [name], Latoya A. ([id number]) Pallor: No Rubor: No Temperature: No Abnormality N/A N/A Tenderness on No N/A N/A Palpation: Wound Preparation: Ulcer Cleansing: N/A N/A Rinsed/Irrigated with Saline Topical Anesthetic Applied: Other: Lidocaine 4% Procedures Performed: Debridement N/A N/A Treatment Notes Electronic Signature(s) Signed: 06/09/2015 5:07:22 PM By: Regan Lemming BSN, RN Entered By: Regan Lemming on 06/09/2015 14:42:22 [name], [name] ([id number]) -------------------------------------------------------------------------------- [place] Details Patient Name: [name]. Date of Service: 06/09/2015 2:15 PM Medical Record Patient Account Number: [id number] Number: Treating RN: Baruch Gouty, RN, BSN, Rita [birth year] [phone] y.o. Other Clinician: Date of Birth/Sex: Female) Treating ROBSON, MICHAEL Primary Care Physician/Extender: Claudette Laws Physician: Referring Physician: Melina Modena in Treatment: 5 Active Inactive Orientation to the Wound Care Program Nursing Diagnoses: Knowledge deficit related to the wound healing center program Goals: Patient/caregiver will verbalize understanding of the [facility] Date Initiated: 06/09/2015 Goal Status:  Active Interventions: Provide education on orientation to the wound center Notes: Wound/Skin Impairment Nursing Diagnoses: Impaired tissue integrity Knowledge deficit related to ulceration/compromised skin integrity Goals: Patient/caregiver will verbalize understanding of skin care regimen Date Initiated: 06/09/2015 Goal Status: Active Ulcer/skin breakdown will have a volume reduction of 50% by week 8 Date Initiated: 06/09/2015 Goal Status: Active Ulcer/skin breakdown will have a volume reduction of 80% by week 12 Date Initiated: 06/09/2015 Goal Status: Active Ulcer/skin breakdown will heal within 14 weeks Date Initiated: 06/09/2015 Goal Status: Active [name], [name] ([id number]) Interventions: Assess patient/caregiver ability to obtain necessary supplies Assess patient/caregiver ability to perform ulcer/skin care regimen upon admission and as needed Assess ulceration(s) every visit Provide education on ulcer and skin care Notes: Electronic Signature(s) Signed: 06/09/2015 5:07:22 PM By: Regan Lemming BSN, RN Entered By: Regan Lemming on 06/09/2015 14:42:08 [name], [name] ([id number]) -------------------------------------------------------------------------------- Pain Assessment Details Patient Name: [name]. Date of Service: 06/09/2015 2:15 PM Medical Record Patient Account Number: [id number] Number: Treating RN: Baruch Gouty, RN, BSN, Rita 1932/09/14 [phone] y.o. Other Clinician: Date of Birth/Sex: Female) Treating ROBSON, MICHAEL Primary Care Physician/Extender: Claudette Laws Physician: Referring Physician: Sabra Heck,  Mark Weeks in Treatment: 5 Active Problems Location of Pain Severity and Description of Pain Patient Has Paino No Site Locations Pain Management and Medication Current Pain Management: Electronic Signature(s) Signed: 06/09/2015 5:07:22 PM By: Regan Lemming BSN, RN Entered By: Regan Lemming on 06/09/2015 14:22:18 Durand, [name]  ([id number]) -------------------------------------------------------------------------------- Patient/Caregiver Education Details Patient Name: [name]. Date of Service: 06/09/2015 2:15 PM Medical Record Patient Account Number: [id number] Number: Treating RN: Baruch Gouty, RN, BSN, Rita [birth year] [phone] y.o. Other Clinician: Date of Birth/Gender: Female) Treating ROBSON, MICHAEL Primary Care Physician/Extender: Claudette Laws Physician: Suella Grove in Treatment: 5 Referring Physician: Emily Filbert Education Assessment Education Provided To: Patient Education Topics Provided Welcome To [facility] [employer]: Methods: Explain/Verbal Responses: State content correctly Wound/Skin Impairment: Methods: Explain/Verbal Responses: State content correctly Electronic Signature(s) Signed: 06/09/2015 5:07:22 PM By: Regan Lemming BSN, RN Entered By: Regan Lemming on 06/09/2015 14:52:48 [name], [name] ([id number]) -------------------------------------------------------------------------------- Wound Assessment Details Patient Name: [name]. Date of Service: 06/09/2015 2:15 PM Medical Record Patient Account Number: [id number] Number: Treating RN: Baruch Gouty, RN, BSN, Rita 17-Jul-1932 [phone] y.o. Other Clinician: Date of Birth/Sex: Female) Treating ROBSON, MICHAEL Primary Care Physician/Extender: Claudette Laws Physician: Referring Physician: Melina Modena in Treatment: 5 Wound Status Wound Number: 10 Primary Trauma, Other Etiology: Wound Location: Left Lower Leg Wound Open Wounding Event: Trauma Status: Date Acquired: 04/30/2015 Comorbid Cataracts, Anemia, Arrhythmia, Weeks Of Treatment: 5 History: Hypertension, Osteoarthritis, Received Clustered Wound: No Radiation Photos Photo Uploaded By: Regan Lemming on 06/09/2015 17:06:23 Wound Measurements Length: (cm) 3.1 Width: (cm) 2.4 Depth: (cm) 0.1 Area: (cm) 5.843 Volume: (cm) 0.584 % Reduction in Area: 66.9% % Reduction  in Volume: 66.9% Epithelialization: None Tunneling: No Undermining: No Wound Description Full Thickness Without Exposed Classification: Support Structures Wound Margin: Distinct, outline attached Exudate Medium Amount: Exudate Type: Serosanguineous Exudate Color: red, brown [name], Latoya A. ([id number]) Foul Odor After Cleansing: No Wound Bed Granulation Amount: Large (67-100%) Exposed Structure Necrotic Amount: Small (1-33%) Fascia Exposed: No Necrotic Quality: Adherent Slough Fat Layer Exposed: No Tendon Exposed: No Muscle Exposed: No Joint Exposed: No Bone Exposed: No Limited to Skin Breakdown Periwound Skin Texture Texture Color No Abnormalities Noted: No No Abnormalities Noted: No Callus: No Atrophie Blanche: No Crepitus: No Cyanosis: No Excoriation: No Ecchymosis: No Fluctuance: No Erythema: No Friable: No Hemosiderin Staining: No Induration: No Mottled: No Localized Edema: No Pallor: No Rash: No Rubor: No Scarring: No Temperature / Pain Moisture Temperature: No Abnormality No Abnormalities Noted: No Dry / Scaly: No Maceration: No Moist: Yes Wound Preparation Ulcer Cleansing: Rinsed/Irrigated with Saline Topical Anesthetic Applied: Other: Lidocaine 4%, Treatment Notes Wound #10 (Left Lower Leg) 1. Cleansed with: Clean wound with Normal Saline 4. Dressing Applied: Santyl Ointment 5. Secondary Dressing Applied Bordered Foam Dressing Notes ace wrap Electronic Signature(s) Signed: 06/09/2015 5:07:22 PM By: Regan Lemming BSN, RN Entered By: Regan Lemming on 06/09/2015 14:38:38 [name], [name] ([id number]) [name], [name] ([id number]) -------------------------------------------------------------------------------- Vitals Details Patient Name: [name] Date of Service: 06/09/2015 2:15 PM Medical Record Patient Account Number: [id number] Number: Treating RN: Baruch Gouty, RN, BSN, Rita 05/15/1932 [phone] y.o. Other Clinician: Date of Birth/Sex: Female)  Treating ROBSON, MICHAEL Primary Care Physician/Extender: Claudette Laws Physician: Referring Physician: Melina Modena in Treatment: 5 Vital Signs Time Taken: 14:22 Temperature (F): 98.0 Height (in): 68 Pulse (bpm): 56 Weight (lbs): 150 Respiratory Rate (breaths/min): 18 Body Mass Index (BMI): 22.8 Blood Pressure (mmHg): 142/40 Reference Range: 80 - 120 mg / dl  Electronic Signature(s) Signed: 06/09/2015 5:07:22 PM By: Regan Lemming BSN, RN Entered By: Regan Lemming on 06/09/2015 14:23:13

## 2015-06-15 ENCOUNTER — Encounter: Payer: PPO | Admitting: Internal Medicine

## 2015-06-15 DIAGNOSIS — S81801A Unspecified open wound, right lower leg, initial encounter: Secondary | ICD-10-CM | POA: Diagnosis not present

## 2015-06-15 NOTE — Progress Notes (Addendum)
[name] ([id number]) Visit Report for 06/15/2015 Arrival Information Details Patient Name: [name], [name]. Date of Service: 06/15/2015 10:45 AM Medical Record Patient Account Number: [id number] Number: Treating RN: Latoya Gouty, RN, BSN, [name] 30-Mar-1933 [phone] y.o. Other Clinician: Date of Birth/Sex: Female) Treating [name] Primary Care Physician/Extender: [name] Physician: Referring Physician: Melina Bailey in Treatment: 5 Visit Information History Since Last Visit Added or deleted any medications: No Patient Arrived: Ambulatory Any new allergies or adverse reactions: No Arrival Time: 10:37 Had a fall or experienced change in No Accompanied By: hubby activities of daily living that may affect Transfer Assistance: None risk of falls: Patient Identification Verified: Yes Signs or symptoms of abuse/neglect since last No Patient Requires Transmission-Based No visito Precautions: Hospitalized since last visit: No Patient Has Alerts: Yes Has Dressing in Place as Prescribed: Yes Patient Alerts: ASA Pain Present Now: No Electronic Signature(s) Signed: 06/15/2015 10:38:15 AM By: [name] BSN, RN Entered By: [name] on 06/15/2015 10:38:14 [name], [name] ([id number]) -------------------------------------------------------------------------------- Clinic Level of Care Assessment Details Patient Name: [name]. Date of Service: 06/15/2015 10:45 AM Medical Record Patient Account Number: [id number] Number: Treating RN: Latoya Gouty, RN, BSN, [name] 09/26/1932 [phone] y.o. Other Clinician: Date of Birth/Sex: Female) Treating [name] Primary Care Physician/Extender: [name] Physician: Referring Physician: Melina Bailey in Treatment: 5 Clinic Level of Care Assessment Items TOOL 4 Quantity Score []  - Use when only an EandM is performed on FOLLOW-UP visit 0 ASSESSMENTS - Nursing Assessment / Reassessment X - Reassessment of Co-morbidities  (includes updates in patient status) 1 10 X - Reassessment of Adherence to Treatment Plan 1 5 ASSESSMENTS - Wound and Skin Assessment / Reassessment X - Simple Wound Assessment / Reassessment - one wound 1 5 []  - Complex Wound Assessment / Reassessment - multiple wounds 0 []  - Dermatologic / Skin Assessment (not related to wound area) 0 ASSESSMENTS - Focused Assessment []  - Circumferential Edema Measurements - multi extremities 0 []  - Nutritional Assessment / Counseling / Intervention 0 X - Lower Extremity Assessment (monofilament, tuning fork, pulses) 1 5 []  - Peripheral Arterial Disease Assessment (using hand held doppler) 0 ASSESSMENTS - Ostomy and/or Continence Assessment and Care []  - Incontinence Assessment and Management 0 []  - Ostomy Care Assessment and Management (repouching, etc.) 0 PROCESS - Coordination of Care X - Simple Patient / Family Education for ongoing care 1 15 []  - Complex (extensive) Patient / Family Education for ongoing care 0 []  - Staff obtains Consents, Records, Test Results / Process Orders 0 Halt, [name] ([id number]) []  - Staff telephones HHA, Nursing Homes / Clarify orders / etc 0 []  - Routine Transfer to another Facility (non-emergent condition) 0 []  - Routine Hospital Admission (non-emergent condition) 0 []  - New Admissions / Biomedical engineer / Ordering NPWT, Apligraf, etc. 0 []  - Emergency Hospital Admission (emergent condition) 0 []  - Simple Discharge Coordination 0 []  - Complex (extensive) Discharge Coordination 0 PROCESS - Special Needs []  - Pediatric / Minor Patient Management 0 []  - Isolation Patient Management 0 []  - Hearing / Language / Visual special needs 0 []  - Assessment of Community assistance (transportation, D/C planning, etc.) 0 []  - Additional assistance / Altered mentation 0 []  - Support Surface(s) Assessment (bed, cushion, seat, etc.) 0 INTERVENTIONS - Wound Cleansing / Measurement X - Simple Wound Cleansing - one wound 1  5 []  - Complex Wound Cleansing - multiple wounds 0 X - Wound Imaging (photographs - any number of  wounds) 1 5 []  - Wound Tracing (instead of photographs) 0 X - Simple Wound Measurement - one wound 1 5 []  - Complex Wound Measurement - multiple wounds 0 INTERVENTIONS - Wound Dressings X - Small Wound Dressing one or multiple wounds 1 10 []  - Medium Wound Dressing one or multiple wounds 0 []  - Large Wound Dressing one or multiple wounds 0 []  - Application of Medications - topical 0 []  - Application of Medications - injection 0 [name], Latoya A. ([id number]) INTERVENTIONS - Miscellaneous []  - External ear exam 0 []  - Specimen Collection (cultures, biopsies, blood, body fluids, etc.) 0 []  - Specimen(s) / Culture(s) sent or taken to Lab for analysis 0 []  - Patient Transfer (multiple staff / Harrel Lemon Lift / Similar devices) 0 []  - Simple Staple / Suture removal (25 or less) 0 []  - Complex Staple / Suture removal (26 or more) 0 []  - Hypo / Hyperglycemic Management (close monitor of Blood Glucose) 0 []  - Ankle / Brachial Index (ABI) - do not check if billed separately 0 X - Vital Signs 1 5 Has the patient been seen at the hospital within the last three years: Yes Total Score: 70 Level Of Care: New/Established - Level 2 Electronic Signature(s) Signed: 06/15/2015 11:27:40 AM By: [name] BSN, RN Entered By: [name] on 06/15/2015 11:27:39 [name] ([id number]) -------------------------------------------------------------------------------- Encounter Discharge Information Details Patient Name: [name]. Date of Service: 06/15/2015 10:45 AM Medical Record Patient Account Number: [id number] Number: Treating RN: Latoya Gouty, RN, BSN, [name] October 14, 1932 [phone] y.o. Other Clinician: Date of Birth/Sex: Female) Treating [name] Primary Care Physician/Extender: [name] Physician: Referring Physician: Melina Bailey in Treatment: 5 Encounter Discharge Information  Items Discharge Pain Level: 0 Discharge Condition: Stable Ambulatory Status: Cane Discharge Destination: Home Transportation: Private Auto Accompanied By: Micheline Rough in lobby Schedule Follow-up Appointment: No Medication Reconciliation completed No and provided to Patient/Care [name]: Provided on Clinical Summary of Care: 06/15/2015 Form Type Recipient Paper Patient JW Electronic Signature(s) Signed: 06/15/2015 11:28:55 AM By: [name] BSN, RN Previous Signature: 06/15/2015 11:23:37 AM Version By: Ruthine Dose Entered By: [name] on 06/15/2015 11:28:55 [name], [name] ([id number]) -------------------------------------------------------------------------------- Lower Extremity Assessment Details Patient Name: [name]. Date of Service: 06/15/2015 10:45 AM Medical Record Patient Account Number: [id number] Number: Treating RN: Latoya Gouty, RN, BSN, [name] [birth year] [phone] y.o. Other Clinician: Date of Birth/Sex: Female) Treating [name] Primary Care Physician/Extender: [name] Physician: Referring Physician: Melina Bailey in Treatment: 5 Edema Assessment Assessed: [Left: No] [Right: No] E[Left: dema] [Right: :] Calf Left: Right: Point of Measurement: 34 cm From Medial Instep cm 31.3 cm Ankle Left: Right: Point of Measurement: 10 cm From Medial Instep cm 22 cm Vascular Assessment Claudication: Claudication Assessment [Right:None] Pulses: Posterior Tibial Dorsalis Pedis Palpable: [Right:Yes] Extremity colors, hair growth, and conditions: Extremity Color: [Right:Mottled] Hair Growth on Extremity: [Right:No] Temperature of Extremity: [Right:Warm] Capillary Refill: [Right:< 3 seconds] Toe Nail Assessment Left: Right: Thick: No Discolored: No Deformed: No Improper Length and Hygiene: No Electronic Signature(s) [name], [name] ([id number]) Signed: 06/15/2015 10:39:02 AM By: [name] BSN, RN Entered By: [name] on 06/15/2015 10:39:02 Boldman, [name] ([id number]) -------------------------------------------------------------------------------- Multi Wound Chart Details Patient Name: [name]. Date of Service: 06/15/2015 10:45 AM Medical Record Patient Account Number: [id number] Number: Treating RN: Latoya Gouty, RN, BSN, [name] 1932-10-06 [phone] y.o. Other Clinician: Date of Birth/Sex: Female) Treating [name] Primary Care Physician/Extender: [name] Physician: Referring Physician: Emily Filbert  Weeks in Treatment: 5 Vital Signs Height(in): 68 Pulse(bpm): 59 Weight(lbs): 150 Blood Pressure 125/40 (mmHg): Body Mass Index(BMI): 23 Temperature(F): 97.5 Respiratory Rate 17 (breaths/min): Photos: [10:No Photos] [N/A:N/A] Wound Location: [10:Right Lower Leg] [N/A:N/A] Wounding Event: [10:Trauma] [N/A:N/A] Primary Etiology: [10:Trauma, Other] [N/A:N/A] Date Acquired: [10:04/30/2015] [N/A:N/A] Weeks of Treatment: [10:5] [N/A:N/A] Wound Status: [10:Open] [N/A:N/A] Measurements L x W x D 2.5x2x0.1 [N/A:N/A] (cm) Area (cm) : [10:3.927] [N/A:N/A] Volume (cm) : [10:0.393] [N/A:N/A] % Reduction in Area: [10:77.80%] [N/A:N/A] % Reduction in Volume: 77.80% [N/A:N/A] Classification: [10:Full Thickness Without Exposed Support Structures] [N/A:N/A] Periwound Skin Texture: No Abnormalities Noted [N/A:N/A] Periwound Skin [10:No Abnormalities Noted] [N/A:N/A] Moisture: Periwound Skin Color: No Abnormalities Noted [N/A:N/A] Tenderness on [10:No] [N/A:N/A] Treatment Notes Electronic Signature(s) [name], [name] ([id number]) Signed: 06/15/2015 4:37:25 PM By: [name] BSN, RN Entered By: [name] on 06/15/2015 11:12:41 [name], [name] ([id number]) -------------------------------------------------------------------------------- [place] Details Patient Name: [name]. Date of Service: 06/15/2015 10:45 AM Medical Record Patient Account Number: [id number] Number: Treating RN: Latoya Gouty, RN,  BSN, [name] [birth year] [phone] y.o. Other Clinician: Date of Birth/Sex: Female) Treating [name] Primary Care Physician/Extender: [name] Physician: Referring Physician: Melina Bailey in Treatment: 5 Active Inactive Orientation to the Wound Care Program Nursing Diagnoses: Knowledge deficit related to the wound healing center program Goals: Patient/caregiver will verbalize understanding of the [facility] Program Date Initiated: 06/09/2015 Goal Status: Active Interventions: Provide education on orientation to the wound center Notes: Wound/Skin Impairment Nursing Diagnoses: Impaired tissue integrity Knowledge deficit related to ulceration/compromised skin integrity Goals: Patient/caregiver will verbalize understanding of skin care regimen Date Initiated: 06/09/2015 Goal Status: Active Ulcer/skin breakdown will have a volume reduction of 50% by week 8 Date Initiated: 06/09/2015 Goal Status: Active Ulcer/skin breakdown will have a volume reduction of 80% by week 12 Date Initiated: 06/09/2015 Goal Status: Active Ulcer/skin breakdown will heal within 14 weeks Date Initiated: 06/09/2015 Goal Status: Active [name], [name] ([id number]) Interventions: Assess patient/caregiver ability to obtain necessary supplies Assess patient/caregiver ability to perform ulcer/skin care regimen upon admission and as needed Assess ulceration(s) every visit Provide education on ulcer and skin care Notes: Electronic Signature(s) Signed: 06/15/2015 4:37:25 PM By: [name] BSN, RN Entered By: [name] on 06/15/2015 11:10:37 [name], [name] ([id number]) -------------------------------------------------------------------------------- Pain Assessment Details Patient Name: [name]. Date of Service: 06/15/2015 10:45 AM Medical Record Patient Account Number: [id number] Number: Treating RN: Latoya Gouty, RN, BSN, [name] 06-15-1932 [phone] y.o. Other Clinician: Date of  Birth/Sex: Female) Treating [name] Primary Care Physician/Extender: [name] Physician: Referring Physician: Melina Bailey in Treatment: 5 Active Problems Location of Pain Severity and Description of Pain Patient Has Paino No Site Locations Pain Management and Medication Current Pain Management: Electronic Signature(s) Signed: 06/15/2015 4:37:25 PM By: [name] BSN, RN Entered By: [name] on 06/15/2015 10:40:44 [name], [name] ([id number]) -------------------------------------------------------------------------------- Patient/Caregiver Education Details Patient Name: [name] Date of Service: 06/15/2015 10:45 AM Medical Record Patient Account Number: [id number] Number: Treating RN: Latoya Gouty, RN, BSN, [name] Nov 20, 1932 [phone] y.o. Other Clinician: Date of Birth/Gender: Female) Treating [name] Primary Care Physician/Extender: [name] Physician: [name] in Treatment: 5 Referring Physician: Emily Filbert Education Assessment Education Provided To: Patient Education Topics Provided Basic Hygiene: Methods: Explain/Verbal Responses: State content correctly Welcome To [facility] [place]: Methods: Explain/Verbal Responses: State content correctly Wound/Skin Impairment: Methods: Explain/Verbal Responses: State content correctly Electronic Signature(s) Signed: 06/15/2015 11:29:21 AM By: [name] BSN, RN Entered ByBaruch Bailey,  [name] on 06/15/2015 11:29:20 [name], [name] ([id number]) -------------------------------------------------------------------------------- Wound Assessment Details Patient Name: MARYLYNNE, FARINA. Date of Service: 06/15/2015 10:45 AM Medical Record Patient Account Number: [id number] Number: Treating RN: Latoya Gouty, RN, BSN, [name] 09-30-1932 [phone] y.o. Other Clinician: Date of Birth/Sex: Female) Treating [name] Primary Care Physician/Extender: [name] Physician: Referring Physician: Melina Bailey  in Treatment: 5 Wound Status Wound Number: 10 Primary Etiology: Trauma, Other Wound Location: Right Lower Leg Wound Status: Open Wounding Event: Trauma Date Acquired: 04/30/2015 Weeks Of Treatment: 5 Clustered Wound: No Photos Photo Uploaded By: [name] on 06/15/2015 16:36:24 Wound Measurements Length: (cm) 2.5 Width: (cm) 2 Depth: (cm) 0.1 Area: (cm) 3.927 Volume: (cm) 0.393 % Reduction in Area: 77.8% % Reduction in Volume: 77.8% Wound Description Full Thickness Without Exposed Classification: Support Structures Periwound Skin Texture Texture Color No Abnormalities Noted: No No Abnormalities Noted: No Moisture No Abnormalities Noted: No Recktenwald, Wannetta A. ([id number]) Treatment Notes Wound #10 (Right Lower Leg) 1. Cleansed with: Clean wound with Normal Saline 4. Dressing Applied: Prisma Ag 5. Secondary Dressing Applied Bordered Foam Dressing Electronic Signature(s) Signed: 06/15/2015 4:37:25 PM By: [name] BSN, RN Entered By: [name] on 06/15/2015 10:44:27 Muckle, [name] ([id number]) -------------------------------------------------------------------------------- Vitals Details Patient Name: [name] Date of Service: 06/15/2015 10:45 AM Medical Record Patient Account Number: [id number] Number: Treating RN: Latoya Gouty, RN, BSN, [name] 27-Mar-1933 [phone] y.o. Other Clinician: Date of Birth/Sex: Female) Treating [name] Primary Care Physician/Extender: [name] Physician: Referring Physician: Melina Bailey in Treatment: 5 Vital Signs Time Taken: 10:44 Temperature (F): 97.5 Height (in): 68 Pulse (bpm): 59 Weight (lbs): 150 Respiratory Rate (breaths/min): 17 Body Mass Index (BMI): 22.8 Blood Pressure (mmHg): 125/40 Reference Range: 80 - 120 mg / dl Electronic Signature(s) Signed: 06/15/2015 4:37:25 PM By: [name] BSN, RN Entered By: [name] on 06/15/2015 10:48:31

## 2015-06-16 ENCOUNTER — Ambulatory Visit: Payer: PPO | Admitting: Internal Medicine

## 2015-06-16 DIAGNOSIS — R208 Other disturbances of skin sensation: Secondary | ICD-10-CM | POA: Diagnosis not present

## 2015-06-16 DIAGNOSIS — D485 Neoplasm of uncertain behavior of skin: Secondary | ICD-10-CM | POA: Diagnosis not present

## 2015-06-16 DIAGNOSIS — B079 Viral wart, unspecified: Secondary | ICD-10-CM | POA: Diagnosis not present

## 2015-06-16 NOTE — Progress Notes (Signed)
JOELE, ZENO ([id number]) Visit Report for 06/15/2015 Chief Complaint Document Details Patient Name: [name], [name]. Date of Service: 06/15/2015 10:45 AM Medical Record Patient Account Number: [id number] Number: Treating RN: Baruch Gouty, RN, BSN, Rita [birth year] [phone] y.o. Other Clinician: Date of Birth/Sex: Female) Treating ROBSON, MICHAEL Primary Care Physician/Extender: Claudette Laws Physician: Referring Physician: Melina Modena in Treatment: 5 Information Obtained from: Patient Chief Complaint Right anterior calf traumatic hematoma. Cellulitis. Electronic Signature(s) Signed: 06/16/2015 8:09:37 AM By: Linton Ham MD Entered By: Linton Ham on 06/15/2015 11:25:14 Arko, [name] ([id number]) -------------------------------------------------------------------------------- HPI Details Patient Name: [name]. Date of Service: 06/15/2015 10:45 AM Medical Record Patient Account Number: [id number] Number: Treating RN: Baruch Gouty, RN, BSN, Rita October 14, 1932 [phone] y.o. Other Clinician: Date of Birth/Sex: Female) Treating ROBSON, MICHAEL Primary Care Physician/Extender: Claudette Laws Physician: Referring Physician: Melina Modena in Treatment: 5 History of Present Illness HPI Description: Very pleasant 82 year old with history of atrial fibrillation and peripheral vascular disease. 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 Healed as of November 2016. Seen by her cardiologist Dr. Kathlyn [place] in April 2016. Noninvasive vascular evaluation showed an ABI of 0.68 on the right and 0.85 on the left. 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 One-vessel runoff below the knee via the peroneal artery with short occlusion of the anterior tibial artery and reconstitution. No intervention performed. She hit her leg on a wooden  duck 2 days ago and noted immediate swelling. This has not significantly increased in size but has become "softer". No drainage. Mild pain. Ambulating per her baseline. No significant claudication. No antibiotics. No fever or chills. No drainage. Unable to apply her compression stockings. 05/12/2015 -- the culture done last week -- no growth was found. patient already on clindamycin and will complete her course. 05/19/15; the patient is on antibiotics prescribed by her primary physician. This is a wound that was caused by traumatizing her right leg on a wooden duck. She had a hematoma which is since been evacuated. She is using Aquacel Ag and foam but no compression. Her treatment nurse tells me today that there is a reason for the noncompression, apparently the patient did not tolerate them well in the past for reasons that are not clear. She does have venous insufficiency/stasis physiology 05/26/15; once again the wound is fairly liberally covered by a tight fibrinous slough. She does not tolerate debridement with a curette well due to pain even with 4% lidocaine. She has been using Aquacel Ag 06/02/15 again debridement over the wound area surgically to remove tight in his slough and nonviable subcutaneous tissue. 06/09/15 the patient arrived with her wound and in much better condition. She has islands of epithelialization. The rest of this required a selective debridement however that nonviable surface slough comes off easily with a curette. There is no evidence of infection 06/15/15; wound continues to improve all the granulation that does not require debridement. There is an irregular "Z" shaped area of epithelialization going across the wound bed. I think the Annitta Needs has outlived its usefulness and we can go on to a collagen-based dressing. Electronic Signature(s) Signed: 06/16/2015 8:09:37 AM By: Linton Ham MD Entered By: Linton Ham on 06/15/2015 11:26:12 [name], [name]  ([id number]) -------------------------------------------------------------------------------- Physical Exam Details Patient Name: [name]. Date of Service: 06/15/2015 10:45 AM Medical Record Patient Account Number: [id number]  Number: Treating RN: Baruch Gouty, RN, BSN, Rita Apr 28, 1932 [phone] y.o. Other Clinician: Date of Birth/Sex: Female) Treating ROBSON, MICHAEL Primary Care Physician/Extender: Claudette Laws Physician: Referring Physician: Melina Modena in Treatment: 5 Notes Wound exam; no debridement is required. No surrounding infection. Irregular islands of epithelialization are present. Electronic Signature(s) Signed: 06/16/2015 8:09:37 AM By: Linton Ham MD Entered By: Linton Ham on 06/15/2015 11:27:31 [name], [name] ([id number]) -------------------------------------------------------------------------------- Physician Orders Details Patient Name: [name] Date of Service: 06/15/2015 10:45 AM Medical Record Patient Account Number: [id number] Number: Treating RN: Baruch Gouty, RN, BSN, Rita 21-Oct-1932 [phone] y.o. Other Clinician: Date of Birth/Sex: Female) Treating ROBSON, MICHAEL Primary Care Physician/Extender: Claudette Laws Physician: Referring Physician: Melina Modena in Treatment: 5 Verbal / Phone Orders: Yes Clinician: Afful, RN, BSN, Rita Read Back and Verified: Yes Diagnosis Coding Wound Cleansing Wound #10 Right Lower Leg o Clean wound with Normal Saline. Anesthetic Wound #10 Right Lower Leg o Topical Lidocaine 4% cream applied to wound bed prior to debridement Primary Wound Dressing Wound #10 Right Lower Leg o Prisma Ag Secondary Dressing Wound #10 Right Lower Leg o Boardered Foam Dressing Dressing Change Frequency Wound #10 Right Lower Leg o Change dressing every day. Follow-up Appointments Wound #10 Right Lower Leg o Return Appointment in 1 week. Electronic Signature(s) Signed: 06/15/2015 4:37:25 PM By: Regan Lemming BSN, RN Signed: 06/16/2015 8:09:37 AM By: Linton Ham MD Entered By: Regan Lemming on 06/15/2015 11:16:51 [name], [name] ([id number]) -------------------------------------------------------------------------------- Problem List Details Patient Name: [name]. Date of Service: 06/15/2015 10:45 AM Medical Record Patient Account Number: [id number] Number: Treating RN: Baruch Gouty, RN, BSN, Rita Oct 19, 1932 [phone] y.o. Other Clinician: Date of Birth/Sex: Female) Treating ROBSON, MICHAEL Primary Care Physician/Extender: Claudette Laws Physician: Referring Physician: Melina Modena in Treatment: 5 Active Problems ICD-10 Encounter Code Description Active Date Diagnosis S81.801A Unspecified open wound, right lower leg, initial encounter 05/05/2015 Yes I48.2 Chronic atrial fibrillation 05/05/2015 Yes L03.115 Cellulitis of right lower limb 05/05/2015 Yes Inactive Problems Resolved Problems Electronic Signature(s) Signed: 06/16/2015 8:09:37 AM By: Linton Ham MD Entered By: Linton Ham on 06/15/2015 11:24:42 [name], [name] ([id number]) -------------------------------------------------------------------------------- Progress Note Details Patient Name: [name]. Date of Service: 06/15/2015 10:45 AM Medical Record Patient Account Number: [id number] Number: Treating RN: Baruch Gouty, RN, BSN, Rita 06-30-1932 [phone] y.o. Other Clinician: Date of Birth/Sex: Female) Treating ROBSON, MICHAEL Primary Care Physician/Extender: Claudette Laws Physician: Referring Physician: Melina Modena in Treatment: 5 Subjective Chief Complaint Information obtained from Patient Right anterior calf traumatic hematoma. Cellulitis. History of Present Illness (HPI) Very pleasant 82 year old with history of atrial fibrillation and peripheral vascular disease. 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 Healed as of  November 2016. Seen by her cardiologist Dr. Kathlyn [place] in April 2016. Noninvasive vascular evaluation showed an ABI of 0.68 on the right and 0.85 on the left. 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 One-vessel runoff below the knee via the peroneal artery with short occlusion of the anterior tibial artery and reconstitution. No intervention performed. She hit her leg on a wooden duck 2 days ago and noted immediate swelling. This has not significantly increased in size but has become "softer". No drainage. Mild pain. Ambulating per her baseline. No significant claudication. No antibiotics. No fever or chills. No drainage. Unable to apply her compression stockings. 05/12/2015 -- the  culture done last week -- no growth was found. patient already on clindamycin and will complete her course. 05/19/15; the patient is on antibiotics prescribed by her primary physician. This is a wound that was caused by traumatizing her right leg on a wooden duck. She had a hematoma which is since been evacuated. She is using Aquacel Ag and foam but no compression. Her treatment nurse tells me today that there is a reason for the noncompression, apparently the patient did not tolerate them well in the past for reasons that are not clear. She does have venous insufficiency/stasis physiology 05/26/15; once again the wound is fairly liberally covered by a tight fibrinous slough. She does not tolerate debridement with a curette well due to pain even with 4% lidocaine. She has been using Aquacel Ag 06/02/15 again debridement over the wound area surgically to remove tight in his slough and nonviable subcutaneous tissue. 06/09/15 the patient arrived with her wound and in much better condition. She has islands of epithelialization. The rest of this required a selective debridement however that nonviable surface slough comes off easily with a curette.  There is no evidence of infection 06/15/15; wound continues to improve all the granulation that does not require debridement. There is an irregular "Z" shaped area of epithelialization going across the wound bed. I think the Annitta Needs has outlived its usefulness and we can go on to a collagen-based dressing. [name], [name] ([id number]) Objective Constitutional Vitals Time Taken: 10:44 AM, Height: 68 in, Weight: 150 lbs, BMI: 22.8, Temperature: 97.5 F, Pulse: 59 bpm, Respiratory Rate: 17 breaths/min, Blood Pressure: 125/40 mmHg. Integumentary (Hair, Skin) Wound #10 status is Open. Original cause of wound was Trauma. The wound is located on the Right Lower Leg. The wound measures 2.5cm length x 2cm width x 0.1cm depth; 3.927cm^2 area and 0.393cm^3 volume. Assessment Active Problems ICD-10 S81.801A - Unspecified open wound, right lower leg, initial encounter I48.2 - Chronic atrial fibrillation L03.115 - Cellulitis of right lower limb Plan Wound Cleansing: Wound #10 Right Lower Leg: Clean wound with Normal Saline. Anesthetic: Wound #10 Right Lower Leg: Topical Lidocaine 4% cream applied to wound bed prior to debridement Primary Wound Dressing: Wound #10 Right Lower Leg: Prisma Ag Secondary Dressing: Wound #10 Right Lower Leg: Boardered Foam Dressing [name], [name] ([id number]) Dressing Change Frequency: Wound #10 Right Lower Leg: Change dressing every day. Follow-up Appointments: Wound #10 Right Lower Leg: Return Appointment in 1 week. #1 we will change her to collagen under a border foam #2 the patient had requested a two-week follow-up which I initially agreed to however with further discussion with the staff here I think a better choice is to see her again next week as we have only just change the dressing today. Electronic Signature(s) Signed: 06/16/2015 8:09:37 AM By: Linton Ham MD Entered By: Linton Ham on 06/15/2015 11:28:35 [name], [name]  ([id number]) -------------------------------------------------------------------------------- SuperBill Details Patient Name: [name]. Date of Service: 06/15/2015 Medical Record Patient Account Number: [id number] Number: Treating RN: Baruch Gouty, RN, BSN, Rita May 05, 1932 [phone] y.o. Other Clinician: Date of Birth/Sex: Female) Treating ROBSON, MICHAEL Primary Care Physician/Extender: Claudette Laws Physician: Suella Grove in Treatment: 5 Referring Physician: Emily Filbert Diagnosis Coding ICD-10 Codes Code Description [phone] Unspecified open wound, right lower leg, initial encounter I48.2 Chronic atrial fibrillation L03.115 Cellulitis of right lower limb Facility Procedures CPT4 Code: [id number] Description: [phone] - WOUND CARE VISIT-LEV 2 EST PT Modifier: Quantity: 1 Physician Procedures CPT4 Code: [id number] Description: [id number] - WC PHYS LEVEL 2 -  EST PT ICD-10 Description Diagnosis S81.801A Unspecified open wound, right lower leg, initial Modifier: encounter Quantity: 1 Electronic Signature(s) Signed: 06/16/2015 8:09:37 AM By: Linton Ham MD Entered By: Linton Ham on 06/15/2015 11:29:11

## 2015-06-23 ENCOUNTER — Ambulatory Visit: Payer: PPO | Admitting: Internal Medicine

## 2015-06-29 DIAGNOSIS — K5900 Constipation, unspecified: Secondary | ICD-10-CM | POA: Diagnosis not present

## 2015-06-29 DIAGNOSIS — K210 Gastro-esophageal reflux disease with esophagitis: Secondary | ICD-10-CM | POA: Diagnosis not present

## 2015-06-29 DIAGNOSIS — R079 Chest pain, unspecified: Secondary | ICD-10-CM | POA: Diagnosis not present

## 2015-06-30 ENCOUNTER — Encounter: Payer: PPO | Attending: Surgery | Admitting: Internal Medicine

## 2015-06-30 DIAGNOSIS — I739 Peripheral vascular disease, unspecified: Secondary | ICD-10-CM | POA: Insufficient documentation

## 2015-06-30 DIAGNOSIS — S81801A Unspecified open wound, right lower leg, initial encounter: Secondary | ICD-10-CM | POA: Insufficient documentation

## 2015-06-30 DIAGNOSIS — L03115 Cellulitis of right lower limb: Secondary | ICD-10-CM | POA: Insufficient documentation

## 2015-06-30 DIAGNOSIS — M1990 Unspecified osteoarthritis, unspecified site: Secondary | ICD-10-CM | POA: Diagnosis not present

## 2015-06-30 DIAGNOSIS — I482 Chronic atrial fibrillation: Secondary | ICD-10-CM | POA: Diagnosis not present

## 2015-06-30 DIAGNOSIS — I10 Essential (primary) hypertension: Secondary | ICD-10-CM | POA: Diagnosis not present

## 2015-06-30 DIAGNOSIS — Z853 Personal history of malignant neoplasm of breast: Secondary | ICD-10-CM | POA: Insufficient documentation

## 2015-07-01 NOTE — Progress Notes (Signed)
JALEEA, MALACHOWSKI ([id number]) Visit Report for 06/30/2015 Chief Complaint Document Details Patient Name: [name], [name]. Date of Service: 06/30/2015 12:45 PM Medical Record Patient Account Number: [id number] Number: Treating RN: Baruch Gouty, RN, BSN, Rita May 12, 1932 [phone] y.o. Other Clinician: Date of Birth/Sex: Female) Treating Winter Trefz Primary Care Physician/Extender: Claudette Laws Physician: Referring Physician: Melina Modena in Treatment: 8 Information Obtained from: Patient Chief Complaint Right anterior calf traumatic hematoma. Cellulitis. Electronic Signature(s) Signed: 06/30/2015 4:45:15 PM By: Linton Ham MD Entered By: Linton Ham on 06/30/2015 13:19:39 Seyller, [name] ([id number]) -------------------------------------------------------------------------------- HPI Details Patient Name: [name]. Date of Service: 06/30/2015 12:45 PM Medical Record Patient Account Number: [id number] Number: Treating RN: Baruch Gouty, RN, BSN, Rita 28-Oct-1932 [phone] y.o. Other Clinician: Date of Birth/Sex: Female) Treating Almetta Liddicoat Primary Care Physician/Extender: Claudette Laws Physician: Referring Physician: Melina Modena in Treatment: 8 History of Present Illness HPI Description: Very pleasant 81 year old with history of atrial fibrillation and peripheral vascular disease. 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 Healed as of November 2016. Seen by her cardiologist Dr. Kathlyn Sacramento in April 2016. Noninvasive vascular evaluation showed an ABI of 0.68 on the right and 0.85 on the left. 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 One-vessel runoff below the knee via the peroneal artery with short occlusion of the anterior tibial artery and reconstitution. No intervention performed. She hit her leg on a  wooden duck 2 days ago and noted immediate swelling. This has not significantly increased in size but has become "softer". No drainage. Mild pain. Ambulating per her baseline. No significant claudication. No antibiotics. No fever or chills. No drainage. Unable to apply her compression stockings. 05/12/2015 -- the culture done last week -- no growth was found. patient already on clindamycin and will complete her course. 05/19/15; the patient is on antibiotics prescribed by her primary physician. This is a wound that was caused by traumatizing her right leg on a wooden duck. She had a hematoma which is since been evacuated. She is using Aquacel Ag and foam but no compression. Her treatment nurse tells me today that there is a reason for the noncompression, apparently the patient did not tolerate them well in the past for reasons that are not clear. She does have venous insufficiency/stasis physiology 05/26/15; once again the wound is fairly liberally covered by a tight fibrinous slough. She does not tolerate debridement with a curette well due to pain even with 4% lidocaine. She has been using Aquacel Ag 06/02/15 again debridement over the wound area surgically to remove tight in his slough and nonviable subcutaneous tissue. 06/09/15 the patient arrived with her wound and in much better condition. She has islands of epithelialization. The rest of this required a selective debridement however that nonviable surface slough comes off easily with a curette. There is no evidence of infection 06/15/15; wound continues to improve all the granulation that does not require debridement. There is an irregular "Z" shaped area of epithelialization going across the wound bed. I think the Annitta Needs has outlived its usefulness and we can go on to a collagen-based dressing. 06/30/15; the area on her right lateral leg only has 2 small open areas remaining. This is a considerable improvement from last time this is just about  closed. She asked me today about an area over her right lateral malleolus which prompted her  previous stay in this clinic from 2014, healing out in November 2016. She still complains of pain in this area. Although there is no open wound. Per the nursing staff this took months and months and months to heal. It sounds as though that she had at least 2 applications of a skin substitute. She also relates surgery at [facility] wound care on this area. HAYLEE, RIRIE ([id number]) Electronic Signature(s) Signed: 06/30/2015 4:45:15 PM By: Linton Ham MD Entered By: Linton Ham on 06/30/2015 13:22:24 [name] ([id number]) -------------------------------------------------------------------------------- Physical Exam Details Patient Name: [name]. Date of Service: 06/30/2015 12:45 PM Medical Record Patient Account Number: [id number] Number: Treating RN: Baruch Gouty, RN, BSN, Rita 17-Sep-1932 [phone] y.o. Other Clinician: Date of Birth/Sex: Female) Treating Thao Vanover Primary Care Physician/Extender: Claudette Laws Physician: Referring Physician: Melina Modena in Treatment: 8 Constitutional Patient is hypertensive.Marland Kitchen Respirations regular, non-labored and within target range.. Temperature is normal and within the target range for the patient.. Cardiovascular Pedal pulses absent bilaterally.. Notes Wound exam; much improved almost healed area on the right anterior leg. There only 2 small open areas left ear. No evidence of infection. We're using Prisma/home covered over the right lateral malleolus the skin is dry but there is no open area here. She complains of pain and tenderness but no overt infection Electronic Signature(s) Signed: 06/30/2015 4:45:15 PM By: Linton Ham MD Entered By: Linton Ham on 06/30/2015 13:24:05 Lefeber, [name] ([id number]) -------------------------------------------------------------------------------- Physician Orders Details Patient Name: [name]. Date of Service: 06/30/2015 12:45 PM Medical Record Patient Account Number: [id number] Number: Treating RN: Baruch Gouty, RN, BSN, Rita December 18, 1932 [phone] y.o. Other Clinician: Date of Birth/Sex: Female) Treating Ketih Goodie Primary Care Physician/Extender: Claudette Laws Physician: Referring Physician: Melina Modena in Treatment: 8 Verbal / Phone Orders: Yes Clinician: Afful, RN, BSN, Rita Read Back and Verified: Yes Diagnosis Coding Wound Cleansing Wound #10 Right Lower Leg o Clean wound with Normal Saline. Anesthetic Wound #10 Right Lower Leg o Topical Lidocaine 4% cream applied to wound bed prior to debridement Primary Wound Dressing Wound #10 Right Lower Leg o Prisma Ag Secondary Dressing Wound #10 Right Lower Leg o Boardered Foam Dressing Dressing Change Frequency Wound #10 Right Lower Leg o Change dressing every day. Follow-up Appointments Wound #10 Right Lower Leg o Return Appointment in 1 week. Electronic Signature(s) Signed: 06/30/2015 4:45:15 PM By: Linton Ham MD Signed: 06/30/2015 4:57:40 PM By: Regan Lemming BSN, RN Entered By: Regan Lemming on 06/30/2015 13:13:52 Strole, [name] ([id number]) -------------------------------------------------------------------------------- Problem List Details Patient Name: [name]. Date of Service: 06/30/2015 12:45 PM Medical Record Patient Account Number: [id number] Number: Treating RN: Baruch Gouty, RN, BSN, Rita [birth year] [phone] y.o. Other Clinician: Date of Birth/Sex: Female) Treating Ruthene Methvin Primary Care Physician/Extender: Claudette Laws Physician: Referring Physician: Melina Modena in Treatment: 8 Active Problems ICD-10 Encounter Code Description Active Date Diagnosis S81.801A Unspecified open wound, right lower leg, initial encounter 05/05/2015 Yes I48.2 Chronic atrial fibrillation 05/05/2015 Yes L03.115 Cellulitis of right lower limb 05/05/2015 Yes Inactive  Problems Resolved Problems Electronic Signature(s) Signed: 06/30/2015 4:45:15 PM By: Linton Ham MD Entered By: Linton Ham on 06/30/2015 13:19:25 Conroy, [name] ([id number]) -------------------------------------------------------------------------------- Progress Note Details Patient Name: [name]. Date of Service: 06/30/2015 12:45 PM Medical Record Patient Account Number: [id number] Number: Treating RN: Baruch Gouty, RN, BSN, Rita 12-25-1932 [phone] y.o. Other Clinician: Date of Birth/Sex: Female) Treating Alvaretta Eisenberger Primary Care Physician/Extender: Claudette Laws Physician: Referring Physician: Emily Filbert  Weeks in Treatment: 8 Subjective Chief Complaint Information obtained from Patient Right anterior calf traumatic hematoma. Cellulitis. History of Present Illness (HPI) Very pleasant 82 year old with history of atrial fibrillation and peripheral vascular disease. 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 Healed as of November 2016. Seen by her cardiologist Dr. Kathlyn Sacramento in April 2016. Noninvasive vascular evaluation showed an ABI of 0.68 on the right and 0.85 on the left. 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 One-vessel runoff below the knee via the peroneal artery with short occlusion of the anterior tibial artery and reconstitution. No intervention performed. She hit her leg on a wooden duck 2 days ago and noted immediate swelling. This has not significantly increased in size but has become "softer". No drainage. Mild pain. Ambulating per her baseline. No significant claudication. No antibiotics. No fever or chills. No drainage. Unable to apply her compression stockings. 05/12/2015 -- the culture done last week -- no growth was found. patient already on clindamycin and will complete her  course. 05/19/15; the patient is on antibiotics prescribed by her primary physician. This is a wound that was caused by traumatizing her right leg on a wooden duck. She had a hematoma which is since been evacuated. She is using Aquacel Ag and foam but no compression. Her treatment nurse tells me today that there is a reason for the noncompression, apparently the patient did not tolerate them well in the past for reasons that are not clear. She does have venous insufficiency/stasis physiology 05/26/15; once again the wound is fairly liberally covered by a tight fibrinous slough. She does not tolerate debridement with a curette well due to pain even with 4% lidocaine. She has been using Aquacel Ag 06/02/15 again debridement over the wound area surgically to remove tight in his slough and nonviable subcutaneous tissue. 06/09/15 the patient arrived with her wound and in much better condition. She has islands of epithelialization. The rest of this required a selective debridement however that nonviable surface slough comes off easily with a curette. There is no evidence of infection 06/15/15; wound continues to improve all the granulation that does not require debridement. There is an irregular "Z" shaped area of epithelialization going across the wound bed. I think the Annitta Needs has outlived its usefulness and we can go on to a collagen-based dressing. 06/30/15; the area on her right lateral leg only has 2 small open areas remaining. This is a considerable Soderberg, Havilah A. ([id number]) improvement from last time this is just about closed. She asked me today about an area over her right lateral malleolus which prompted her previous stay in this clinic from 2014, healing out in November 2016. She still complains of pain in this area. Although there is no open wound. Per the nursing staff this took months and months and months to heal. It sounds as though that she had at least 2 applications of a skin substitute. She  also relates surgery at [facility] wound care on this area. Objective Constitutional Patient is hypertensive.Marland Kitchen Respirations regular, non-labored and within target range.. Temperature is normal and within the target range for the patient.. Vitals Time Taken: 12:49 PM, Height: 68 in, Weight: 150 lbs, BMI: 22.8, Temperature: 98 F, Pulse: 57 bpm, Respiratory Rate: 18 breaths/min, Blood Pressure: 190/134 mmHg. Cardiovascular Pedal pulses absent bilaterally.. General Notes: Wound exam; much improved almost healed area on the right  anterior leg. There only 2 small open areas left ear. No evidence of infection. We're using Prisma/home covered over the right lateral malleolus the skin is dry but there is no open area here. She complains of pain and tenderness but no overt infection Integumentary (Hair, Skin) Wound #10 status is Open. Original cause of wound was Trauma. The wound is located on the Right Lower Leg. The wound measures 1cm length x 1cm width x 0.1cm depth; 0.785cm^2 area and 0.079cm^3 volume. The wound is limited to skin breakdown. There is no tunneling or undermining noted. There is a small amount of serous drainage noted. The wound margin is distinct with the outline attached to the wound base. There is large (67-100%) granulation within the wound bed. There is no necrotic tissue within the wound bed. The periwound skin appearance exhibited: Moist. The periwound skin appearance did not exhibit: Callus, Crepitus, Excoriation, Fluctuance, Friable, Induration, Localized Edema, Rash, Scarring, Dry/Scaly, Maceration, Atrophie Blanche, Cyanosis, Ecchymosis, Hemosiderin Staining, Mottled, Pallor, Rubor, Erythema. Periwound temperature was noted as No Abnormality. Assessment Active Problems ICD-10 S81.801A - Unspecified open wound, right lower leg, initial encounter DEWAYNE, BURLAND ([id number]) I48.2 - Chronic atrial fibrillation L03.115 - Cellulitis of right lower limb Plan Wound  Cleansing: Wound #10 Right Lower Leg: Clean wound with Normal Saline. Anesthetic: Wound #10 Right Lower Leg: Topical Lidocaine 4% cream applied to wound bed prior to debridement Primary Wound Dressing: Wound #10 Right Lower Leg: Prisma Ag Secondary Dressing: Wound #10 Right Lower Leg: Boardered Foam Dressing Dressing Change Frequency: Wound #10 Right Lower Leg: Change dressing every day. Follow-up Appointments: Wound #10 Right Lower Leg: Return Appointment in 1 week. #1 we continued with the present/home based dressings the patient is changing #2 also what we can find out about the area over the right lateral malleolus through her stay in this clinic. She was apparently also seen in the [facility] wound care clinic. Also had some procedure done at [facility]) Signed: 06/30/2015 4:45:15 PM By: Linton Ham MD Entered By: Linton Ham on 06/30/2015 13:25:09 Gamino, [name] ([id number]) -------------------------------------------------------------------------------- SuperBill Details Patient Name: [name]. Date of Service: 06/30/2015 Medical Record Patient Account Number: [id number] Number: Treating RN: Baruch Gouty, RN, BSN, Rita 10-19-1932 [phone] y.o. Other Clinician: Date of Birth/Sex: Female) Treating Massa Pe Primary Care Physician/Extender: Claudette Laws Physician: Suella Grove in Treatment: 8 Referring Physician: Emily Filbert Diagnosis Coding ICD-10 Codes Code Description [phone] Unspecified open wound, right lower leg, initial encounter I48.2 Chronic atrial fibrillation L03.115 Cellulitis of right lower limb Facility Procedures CPT4 Code: [id number] Description: [phone] - WOUND CARE VISIT-LEV 2 EST PT Modifier: Quantity: 1 Physician Procedures CPT4 Code: [id number] Description: [id number] - WC PHYS LEVEL 2 - EST PT ICD-10 Description Diagnosis S81.801A Unspecified open wound, right lower leg, initial Modifier: encounter Quantity: 1 Electronic  Signature(s) Signed: 06/30/2015 4:45:15 PM By: Linton Ham MD Entered By: Linton Ham on 06/30/2015 13:25:40

## 2015-07-01 NOTE — Progress Notes (Addendum)
MADELEINE, LAHNER ([id number]) Visit Report for 06/30/2015 Arrival Information Details Patient Name: [name], [name]. Date of Service: 06/30/2015 12:45 PM Medical Record Patient Account Number: [id number] Number: Treating RN: Baruch Gouty, RN, BSN, Rita June 10, [phone] y.o. Other Clinician: Date of Birth/Sex: Female) Treating ROBSON, MICHAEL Primary Care Physician/Extender: Claudette Laws Physician: Referring Physician: Melina Modena in Treatment: 8 Visit Information History Since Last Visit Added or deleted any medications: No Patient Arrived: Ambulatory Any new allergies or adverse reactions: No Arrival Time: 12:47 Had a fall or experienced change in No Accompanied By: self activities of daily living that may affect Transfer Assistance: None risk of falls: Patient Identification Verified: Yes Signs or symptoms of abuse/neglect since last No Secondary Verification Process Yes visito Completed: Hospitalized since last visit: No Patient Requires Transmission-Based No Has Dressing in Place as Prescribed: Yes Precautions: Pain Present Now: No Patient Has Alerts: Yes Patient Alerts: ASA Electronic Signature(s) Signed: 06/30/2015 4:57:40 PM By: Regan Lemming BSN, RN Entered By: Regan Lemming on 06/30/2015 12:47:58 Curvin, [name] ([id number]) -------------------------------------------------------------------------------- Clinic Level of Care Assessment Details Patient Name: [name]. Date of Service: 06/30/2015 12:45 PM Medical Record Patient Account Number: [id number] Number: Treating RN: Baruch Gouty, RN, BSN, Rita 1932-06-09 [phone] y.o. Other Clinician: Date of Birth/Sex: Female) Treating ROBSON, MICHAEL Primary Care Physician/Extender: Claudette Laws Physician: Referring Physician: Melina Modena in Treatment: 8 Clinic Level of Care Assessment Items TOOL 4 Quantity Score []  - Use when only an EandM is performed on FOLLOW-UP visit 0 ASSESSMENTS - Nursing Assessment /  Reassessment X - Reassessment of Co-morbidities (includes updates in patient status) 1 10 X - Reassessment of Adherence to Treatment Plan 1 5 ASSESSMENTS - Wound and Skin Assessment / Reassessment X - Simple Wound Assessment / Reassessment - one wound 1 5 []  - Complex Wound Assessment / Reassessment - multiple wounds 0 []  - Dermatologic / Skin Assessment (not related to wound area) 0 ASSESSMENTS - Focused Assessment []  - Circumferential Edema Measurements - multi extremities 0 []  - Nutritional Assessment / Counseling / Intervention 0 []  - Lower Extremity Assessment (monofilament, tuning fork, pulses) 0 []  - Peripheral Arterial Disease Assessment (using hand held doppler) 0 ASSESSMENTS - Ostomy and/or Continence Assessment and Care []  - Incontinence Assessment and Management 0 []  - Ostomy Care Assessment and Management (repouching, etc.) 0 PROCESS - Coordination of Care X - Simple Patient / Family Education for ongoing care 1 15 []  - Complex (extensive) Patient / Family Education for ongoing care 0 []  - Staff obtains Consents, Records, Test Results / Process Orders 0 Ruzich, DYANDRA ALCIVAR ([id number]) []  - Staff telephones HHA, Nursing Homes / Clarify orders / etc 0 []  - Routine Transfer to another Facility (non-emergent condition) 0 []  - Routine Hospital Admission (non-emergent condition) 0 []  - New Admissions / Biomedical engineer / Ordering NPWT, Apligraf, etc. 0 []  - Emergency Hospital Admission (emergent condition) 0 []  - Simple Discharge Coordination 0 []  - Complex (extensive) Discharge Coordination 0 PROCESS - Special Needs []  - Pediatric / Minor Patient Management 0 []  - Isolation Patient Management 0 []  - Hearing / Language / Visual special needs 0 []  - Assessment of Community assistance (transportation, D/C planning, etc.) 0 []  - Additional assistance / Altered mentation 0 []  - Support Surface(s) Assessment (bed, cushion, seat, etc.) 0 INTERVENTIONS - Wound Cleansing /  Measurement X - Simple Wound Cleansing - one wound 1 5 []  - Complex Wound Cleansing - multiple wounds 0 X - Wound Imaging (photographs -  any number of wounds) 1 5 []  - Wound Tracing (instead of photographs) 0 []  - Simple Wound Measurement - one wound 0 []  - Complex Wound Measurement - multiple wounds 0 INTERVENTIONS - Wound Dressings X - Small Wound Dressing one or multiple wounds 1 10 []  - Medium Wound Dressing one or multiple wounds 0 []  - Large Wound Dressing one or multiple wounds 0 []  - Application of Medications - topical 0 []  - Application of Medications - injection 0 [name], Latoya A. ([id number]) INTERVENTIONS - Miscellaneous []  - External ear exam 0 []  - Specimen Collection (cultures, biopsies, blood, body fluids, etc.) 0 []  - Specimen(s) / Culture(s) sent or taken to Lab for analysis 0 []  - Patient Transfer (multiple staff / Harrel Lemon Lift / Similar devices) 0 []  - Simple Staple / Suture removal (25 or less) 0 []  - Complex Staple / Suture removal (26 or more) 0 []  - Hypo / Hyperglycemic Management (close monitor of Blood Glucose) 0 []  - Ankle / Brachial Index (ABI) - do not check if billed separately 0 X - Vital Signs 1 5 Has the patient been seen at the hospital within the last three years: Yes Total Score: 60 Level Of Care: New/Established - Level 2 Electronic Signature(s) Signed: 06/30/2015 4:57:40 PM By: Regan Lemming BSN, RN Entered By: Regan Lemming on 06/30/2015 13:14:25 [name], [name] ([id number]) -------------------------------------------------------------------------------- Encounter Discharge Information Details Patient Name: [name]. Date of Service: 06/30/2015 12:45 PM Medical Record Patient Account Number: [id number] Number: Treating RN: Baruch Gouty, RN, BSN, Rita [birth year] [phone] y.o. Other Clinician: Date of Birth/Sex: Female) Treating ROBSON, MICHAEL Primary Care Physician/Extender: Claudette Laws Physician: Referring Physician: Melina Modena in  Treatment: 8 Encounter Discharge Information Items Discharge Pain Level: 0 Discharge Condition: Stable Ambulatory Status: Ambulatory Discharge Destination: Home Private Transportation: Auto Accompanied By: self Schedule Follow-up Appointment: No Medication Reconciliation completed and No provided to Patient/Care Mikhael Hendriks: Clinical Summary of Care: Electronic Signature(s) Signed: 06/30/2015 4:57:40 PM By: Regan Lemming BSN, RN Entered By: Regan Lemming on 06/30/2015 13:15:57 [name], [name] ([id number]) -------------------------------------------------------------------------------- Lower Extremity Assessment Details Patient Name: [name]. Date of Service: 06/30/2015 12:45 PM Medical Record Patient Account Number: [id number] Number: Treating RN: Baruch Gouty, RN, BSN, Rita [birth year] [phone] y.o. Other Clinician: Date of Birth/Sex: Female) Treating ROBSON, MICHAEL Primary Care Physician/Extender: Claudette Laws Physician: Referring Physician: Melina Modena in Treatment: 8 Edema Assessment Assessed: [Left: No] [Right: No] E[Left: dema] [Right: :] Calf Left: Right: Point of Measurement: 34 cm From Medial Instep cm 31 cm Ankle Left: Right: Point of Measurement: 10 cm From Medial Instep cm 22 cm Vascular Assessment Claudication: Claudication Assessment [Right:None] Pulses: Posterior Tibial Dorsalis Pedis Palpable: [Right:Yes] Extremity colors, hair growth, and conditions: Extremity Color: [Right:Mottled] Hair Growth on Extremity: [Right:No] Temperature of Extremity: [Right:Warm] Capillary Refill: [Right:< 3 seconds] Toe Nail Assessment Left: Right: Thick: No Discolored: No Deformed: No Improper Length and Hygiene: No Electronic Signature(s) [name], [name] ([id number]) Signed: 06/30/2015 4:57:40 PM By: Regan Lemming BSN, RN Entered By: Regan Lemming on 06/30/2015 12:51:51 [name], [name]  ([id number]) -------------------------------------------------------------------------------- Multi Wound Chart Details Patient Name: [name]. Date of Service: 06/30/2015 12:45 PM Medical Record Patient Account Number: [id number] Number: Treating RN: Baruch Gouty, RN, BSN, Rita [birth year] [phone] y.o. Other Clinician: Date of Birth/Sex: Female) Treating ROBSON, MICHAEL Primary Care Physician/Extender: Claudette Laws Physician: Referring Physician: Melina Modena in Treatment: 8 Vital Signs Height(in): 68 Pulse(bpm): 57 Weight(lbs): 150 Blood Pressure 190/134 (mmHg): Body Mass  Index(BMI): 23 Temperature(F): 98 Respiratory Rate 18 (breaths/min): Photos: [10:No Photos] [N/A:N/A] Wound Location: [10:Right Lower Leg] [N/A:N/A] Wounding Event: [10:Trauma] [N/A:N/A] Primary Etiology: [10:Trauma, Other] [N/A:N/A] Comorbid History: [10:Cataracts, Anemia, Arrhythmia, Hypertension, Osteoarthritis, Received Radiation] [N/A:N/A] Date Acquired: [10:04/30/2015] [N/A:N/A] Weeks of Treatment: [10:8] [N/A:N/A] Wound Status: [10:Open] [N/A:N/A] Measurements L x W x D 1x1x0.1 [N/A:N/A] (cm) Area (cm) : [10:0.785] [N/A:N/A] Volume (cm) : [10:0.079] [N/A:N/A] % Reduction in Area: [10:95.60%] [N/A:N/A] % Reduction in Volume: 95.50% [N/A:N/A] Classification: [10:Full Thickness Without Exposed Support Structures] [N/A:N/A] Exudate Amount: [10:Small] [N/A:N/A] Exudate Type: [10:Serous] [N/A:N/A] Exudate Color: [10:amber] [N/A:N/A] Wound Margin: [10:Distinct, outline attached N/A] Granulation Amount: [10:Large (67-100%)] [N/A:N/A] Necrotic Amount: [10:None Present (0%)] [N/A:N/A] Exposed Structures: [N/A:N/A] Fascia: No Fat: No Tendon: No Muscle: No Joint: No Bone: No Limited to Skin Breakdown Epithelialization: Large (67-100%) N/A N/A Periwound Skin Texture: Edema: No N/A N/A Excoriation: No Induration: No Callus: No Crepitus: No Fluctuance: No Friable: No Rash:  No Scarring: No Periwound Skin Moist: Yes N/A N/A Moisture: Maceration: No Dry/Scaly: No Periwound Skin Color: Atrophie Blanche: No N/A N/A Cyanosis: No Ecchymosis: No Erythema: No Hemosiderin Staining: No Mottled: No Pallor: No Rubor: No Temperature: No Abnormality N/A N/A Tenderness on No N/A N/A Palpation: Wound Preparation: Ulcer Cleansing: N/A N/A Rinsed/Irrigated with Saline Topical Anesthetic Applied: Other: lidocaine 4% Treatment Notes Electronic Signature(s) Signed: 06/30/2015 4:57:40 PM By: Regan Lemming BSN, RN Entered By: Regan Lemming on 06/30/2015 13:13:00 [name], [name] ([id number]) -------------------------------------------------------------------------------- [place] Details Patient Name: [name]. Date of Service: 06/30/2015 12:45 PM Medical Record Patient Account Number: [id number] Number: Treating RN: Baruch Gouty, RN, BSN, Rita 28-Jan-1933 [id number] y.o. Other Clinician: Date of Birth/Sex: Female) Treating ROBSON, MICHAEL Primary Care Physician/Extender: Claudette Laws Physician: Referring Physician: Melina Modena in Treatment: 8 Active Inactive Electronic Signature(s) Signed: 12/14/2015 3:30:22 PM By: Regan Lemming BSN, RN Previous Signature: 06/30/2015 4:57:40 PM Version By: Regan Lemming BSN, RN Entered By: Regan Lemming on 07/16/2015 14:53:57 [name], [name] ([id number]) -------------------------------------------------------------------------------- Pain Assessment Details Patient Name: [name]. Date of Service: 06/30/2015 12:45 PM Medical Record Patient Account Number: [id number] Number: Treating RN: Baruch Gouty, RN, BSN, Rita [birth year] [phone] y.o. Other Clinician: Date of Birth/Sex: Female) Treating ROBSON, MICHAEL Primary Care Physician/Extender: Claudette Laws Physician: Referring Physician: Melina Modena in Treatment: 8 Active Problems Location of Pain Severity and Description of Pain Patient Has Paino  No Site Locations Pain Management and Medication Current Pain Management: Electronic Signature(s) Signed: 06/30/2015 4:57:40 PM By: Regan Lemming BSN, RN Entered By: Regan Lemming on 06/30/2015 12:48:05 [name], [name] ([id number]) -------------------------------------------------------------------------------- Patient/Caregiver Education Details Patient Name: [name]. Date of Service: 06/30/2015 12:45 PM Medical Record Patient Account Number: [id number] Number: Treating RN: Baruch Gouty, RN, BSN, Rita 1932-10-23 [phone] y.o. Other Clinician: Date of Birth/Gender: Female) Treating ROBSON, MICHAEL Primary Care Physician/Extender: Claudette Laws Physician: Suella Grove in Treatment: 8 Referring Physician: Emily Filbert Education Assessment Education Provided To: Patient Education Topics Provided Welcome To [facility]: Methods: Explain/Verbal Responses: State content correctly Wound/Skin Impairment: Methods: Explain/Verbal Responses: State content correctly Electronic Signature(s) Signed: 06/30/2015 4:57:40 PM By: Regan Lemming BSN, RN Entered By: Regan Lemming on 06/30/2015 13:16:48 [name], [name] ([id number]) -------------------------------------------------------------------------------- Wound Assessment Details Patient Name: [name]. Date of Service: 06/30/2015 12:45 PM Medical Record Patient Account Number: [id number] Number: Treating RN: Baruch Gouty, RN, BSN, Rita 1932/12/10 [phone] y.o. Other Clinician: Date of Birth/Sex: Female) Treating ROBSON, MICHAEL Primary Care Physician/Extender: Claudette Laws Physician: Referring  Physician: Emily Filbert Weeks in Treatment: 8 Wound Status Wound Number: 10 Primary Trauma, Other Etiology: Wound Location: Right Lower Leg Wound Healed - Epithelialized Wounding Event: Trauma Status: Date Acquired: 04/30/2015 Comorbid Cataracts, Anemia, Arrhythmia, Weeks Of Treatment: 8 History: Hypertension, Osteoarthritis, Received Clustered  Wound: No Radiation Photos Wound Measurements Length: (cm) 0 % Reduction in Width: (cm) 0 % Reduction in Depth: (cm) 0 Epithelializati Area: (cm) 0 Tunneling: Volume: (cm) 0 Undermining: Area: 100% Volume: 100% on: Large (67-100%) No No Wound Description Full Thickness Without Exposed Foul Odor After Classification: Support Structures Wound Margin: Distinct, outline attached Exudate None Present Amount: Cleansing: No Wound Bed Granulation Amount: None Present (0%) Exposed Structure Necrotic Amount: None Present (0%) Fascia Exposed: No [name], Latoya A. ([id number]) Fat Layer Exposed: No Tendon Exposed: No Muscle Exposed: No Joint Exposed: No Bone Exposed: No Limited to Skin Breakdown Periwound Skin Texture Texture Color No Abnormalities Noted: No No Abnormalities Noted: No Callus: No Atrophie Blanche: No Crepitus: No Cyanosis: No Excoriation: No Ecchymosis: No Fluctuance: No Erythema: No Friable: No Hemosiderin Staining: No Induration: No Mottled: No Localized Edema: No Pallor: No Rash: No Rubor: No Scarring: No Temperature / Pain Moisture Temperature: No Abnormality No Abnormalities Noted: No Dry / Scaly: No Maceration: No Moist: No Wound Preparation Ulcer Cleansing: Rinsed/Irrigated with Saline Topical Anesthetic Applied: None Assessment Notes Patient told RN wound was healed the last time she accompanied her sister to her wcc appt. Treatment Notes Wound #10 (Right Lower Leg) 1. Cleansed with: Clean wound with Normal Saline 4. Dressing Applied: Prisma Ag 5. Secondary Dressing Applied Bordered Foam Dressing Electronic Signature(s) Signed: 07/19/2015 9:27:56 AM By: Regan Lemming BSN, RN Previous Signature: 06/30/2015 4:57:40 PM Version By: Regan Lemming BSN, RN Entered By: Regan Lemming on 07/19/2015 09:27:56 [name], [name] ([id number]) -------------------------------------------------------------------------------- Vitals Details Patient Name: [name]. Date of Service: 06/30/2015 12:45 PM Medical Record Patient Account Number: [id number] Number: Treating RN: Baruch Gouty, RN, BSN, Rita 04-26-1932 [phone] y.o. Other Clinician: Date of Birth/Sex: Female) Treating ROBSON, MICHAEL Primary Care Physician/Extender: Claudette Laws Physician: Referring Physician: Melina Modena in Treatment: 8 Vital Signs Time Taken: 12:49 Temperature (F): 98 Height (in): 68 Pulse (bpm): 57 Weight (lbs): 150 Respiratory Rate (breaths/min): 18 Body Mass Index (BMI): 22.8 Blood Pressure (mmHg): 190/134 Reference Range: 80 - 120 mg / dl Electronic Signature(s) Signed: 06/30/2015 4:57:40 PM By: Regan Lemming BSN, RN Entered By: Regan Lemming on 06/30/2015 12:51:25

## 2015-07-05 ENCOUNTER — Ambulatory Visit: Payer: Medicare PPO | Admitting: Oncology

## 2015-07-05 ENCOUNTER — Other Ambulatory Visit: Payer: Medicare PPO

## 2015-07-09 ENCOUNTER — Inpatient Hospital Stay: Payer: PPO | Admitting: Oncology

## 2015-07-09 ENCOUNTER — Other Ambulatory Visit: Payer: Self-pay | Admitting: *Deleted

## 2015-07-09 ENCOUNTER — Inpatient Hospital Stay: Payer: PPO

## 2015-07-09 DIAGNOSIS — C50919 Malignant neoplasm of unspecified site of unspecified female breast: Secondary | ICD-10-CM

## 2015-07-14 ENCOUNTER — Ambulatory Visit: Payer: PPO | Admitting: Internal Medicine

## 2015-07-21 ENCOUNTER — Ambulatory Visit: Payer: PPO | Admitting: Internal Medicine

## 2015-07-23 ENCOUNTER — Encounter: Payer: PPO | Attending: Surgery | Admitting: Surgery

## 2015-07-23 DIAGNOSIS — I10 Essential (primary) hypertension: Secondary | ICD-10-CM | POA: Diagnosis not present

## 2015-07-23 DIAGNOSIS — M1990 Unspecified osteoarthritis, unspecified site: Secondary | ICD-10-CM | POA: Diagnosis not present

## 2015-07-23 DIAGNOSIS — X58XXXA Exposure to other specified factors, initial encounter: Secondary | ICD-10-CM | POA: Insufficient documentation

## 2015-07-23 DIAGNOSIS — I70233 Atherosclerosis of native arteries of right leg with ulceration of ankle: Secondary | ICD-10-CM | POA: Diagnosis not present

## 2015-07-23 DIAGNOSIS — I70232 Atherosclerosis of native arteries of right leg with ulceration of calf: Secondary | ICD-10-CM | POA: Insufficient documentation

## 2015-07-23 DIAGNOSIS — Z853 Personal history of malignant neoplasm of breast: Secondary | ICD-10-CM | POA: Insufficient documentation

## 2015-07-23 DIAGNOSIS — S81811A Laceration without foreign body, right lower leg, initial encounter: Secondary | ICD-10-CM | POA: Insufficient documentation

## 2015-07-23 DIAGNOSIS — I739 Peripheral vascular disease, unspecified: Secondary | ICD-10-CM | POA: Insufficient documentation

## 2015-07-23 DIAGNOSIS — Z79899 Other long term (current) drug therapy: Secondary | ICD-10-CM | POA: Insufficient documentation

## 2015-07-23 DIAGNOSIS — L03115 Cellulitis of right lower limb: Secondary | ICD-10-CM | POA: Insufficient documentation

## 2015-07-23 DIAGNOSIS — Z7982 Long term (current) use of aspirin: Secondary | ICD-10-CM | POA: Diagnosis not present

## 2015-07-23 DIAGNOSIS — I482 Chronic atrial fibrillation: Secondary | ICD-10-CM | POA: Diagnosis not present

## 2015-07-24 NOTE — Progress Notes (Signed)
VALLEN, TEER ([id number]) Visit Report for 07/23/2015 Arrival Information Details Patient Name: [name]. Date of Service: 07/23/2015 3:00 PM Medical Record Number: [id number] Patient Account Number: [id number] Date of Birth/Sex: [birth year] (82 y.o. Female) Treating RN: Afful, RN, BSN, Velva Harman Primary Care Physician: Emily Filbert Other Clinician: Referring Physician: Emily Filbert Treating Physician/Extender: Frann Rider in Treatment: 11 Visit Information History Since Last Visit Added or deleted any medications: No Patient Arrived: Ambulatory Any new allergies or adverse reactions: No Arrival Time: 14:53 Had a fall or experienced change in No Accompanied By: sisyet activities of daily living that may affect Transfer Assistance: None risk of falls: Patient Identification Verified: Yes Signs or symptoms of abuse/neglect since last No Secondary Verification Process Yes visito Completed: Has Dressing in Place as Prescribed: Yes Patient Requires Transmission-Based No Pain Present Now: No Precautions: Patient Has Alerts: Yes Patient Alerts: ASA Electronic Signature(s) Signed: 07/23/2015 4:31:50 PM By: Regan Lemming BSN, RN Entered By: Regan Lemming on 07/23/2015 15:06:53 Toruno, [name] ([id number]) -------------------------------------------------------------------------------- Encounter Discharge Information Details Patient Name: [name]. Date of Service: 07/23/2015 3:00 PM Medical Record Number: [id number] Patient Account Number: [id number] Date of Birth/Sex: [birth year] (82 y.o. Female) Treating RN: Baruch Gouty, RN, BSN, Velva Harman Primary Care Physician: Emily Filbert Other Clinician: Referring Physician: Emily Filbert Treating Physician/Extender: Frann Rider in Treatment: 11 Encounter Discharge Information Items Discharge Pain Level: 0 Discharge Condition: Stable Ambulatory Status: Ambulatory Discharge Destination: Home Transportation: Private Auto Accompanied By:  sister Schedule Follow-up Appointment: No Medication Reconciliation completed and provided to Patient/Care No Jael Kostick: Provided on Clinical Summary of Care: 07/23/2015 Form Type Recipient Paper Patient JW Electronic Signature(s) Signed: 07/23/2015 3:43:36 PM By: Regan Lemming BSN, RN Previous Signature: 07/23/2015 3:28:30 PM Version By: Ruthine Dose Entered By: Regan Lemming on 07/23/2015 15:43:36 Saye, [name] ([id number]) -------------------------------------------------------------------------------- Lower Extremity Assessment Details Patient Name: [name]. Date of Service: 07/23/2015 3:00 PM Medical Record Number: [id number] Patient Account Number: [id number] Date of Birth/Sex: June 18, 1932 (82 y.o. Female) Treating RN: Afful, RN, BSN, [place] Primary Care Physician: Emily Filbert Other Clinician: Referring Physician: Emily Filbert Treating Physician/Extender: Frann Rider in Treatment: 11 Edema Assessment Assessed: Shirlyn Goltz: No] Patrice Paradise: No] Edema: [Left: Ye] [Right: s] Calf Left: Right: Point of Measurement: 34 cm From Medial Instep cm 32.1 cm Ankle Left: Right: Point of Measurement: 10 cm From Medial Instep cm 22.2 cm Vascular Assessment Pulses: Posterior Tibial Dorsalis Pedis Palpable: [Right:Yes] Extremity colors, hair growth, and conditions: Extremity Color: [Right:Mottled] Hair Growth on Extremity: [Right:Yes] Temperature of Extremity: [Right:Warm] Capillary Refill: [Right:< 3 seconds] Toe Nail Assessment Left: Right: Thick: No Discolored: No Deformed: No Improper Length and Hygiene: No Electronic Signature(s) Signed: 07/23/2015 4:31:50 PM By: Regan Lemming BSN, RN Entered By: Regan Lemming on 07/23/2015 15:08:26 Wafer, [name] ([id number]) -------------------------------------------------------------------------------- Multi Wound Chart Details Patient Name: [name]. Date of Service: 07/23/2015 3:00 PM Medical Record Number: [id number] Patient Account Number:  [id number] Date of Birth/Sex: 02-May-1932 (82 y.o. Female) Treating RN: Baruch Gouty, RN, BSN, Velva Harman Primary Care Physician: Emily Filbert Other Clinician: Referring Physician: Emily Filbert Treating Physician/Extender: Frann Rider in Treatment: 11 Vital Signs Height(in): 68 Pulse(bpm): 53 Weight(lbs): 150 Blood Pressure 125/41 (mmHg): Body Mass Index(BMI): 23 Temperature(F): 97.7 Respiratory Rate 17 (breaths/min): Photos: [12:No Photos] [13:No Photos] [14:No Photos] Wound Location: [12:Right Lower Leg - Anterior, Proximal] [13:Right Lower Leg - Anterior, Distal] [14:Right Lumbar spine - Lateral] Wounding Event: [12:Gradually Appeared] [13:Gradually Appeared] [14:Gradually Appeared] Primary Etiology: [12:Skin Tear] [13:Skin Tear] [14:Atypical] Comorbid History: [12:Cataracts,  Anemia, Arrhythmia, Hypertension, Arrhythmia, Hypertension, Arrhythmia, Hypertension, Osteoarthritis, Received Osteoarthritis, Received Osteoarthritis, Received Radiation] [13:Cataracts, Anemia, Radiation] [14:Cataracts,  Anemia, Radiation] Date Acquired: [12:07/11/2015] [13:07/11/2015] [14:07/11/2015] Weeks of Treatment: [12:0] [13:0] [14:0] Wound Status: [12:Open] [13:Open] [14:Open] Measurements L x W x D 1.5x1.5x0.1 [13:2x2.5x0.1] [14:0.5x0.5x0.1] (cm) Area (cm) : [12:1.767] [13:3.927] [14:0.196] Volume (cm) : [12:0.177] [13:0.393] [14:0.02] % Reduction in Area: [12:0.00%] [13:0.00%] [14:0.00%] % Reduction in Volume: 0.00% [13:0.00%] [14:0.00%] Classification: [12:Unclassifiable] [13:Unclassifiable] [14:Partial Thickness] Exudate Amount: [12:None Present] [13:None Present] [14:Small] Exudate Type: [12:N/A] [13:N/A] [14:Serosanguineous] Exudate Color: [12:N/A] [13:N/A] [14:red, brown] Wound Margin: [12:Distinct, outline attached Distinct, outline attached Distinct, outline attached] Granulation Amount: [12:N/A] [13:None Present (0%)] [14:Medium (34-66%)] Granulation Quality: [12:N/A] [13:N/A] [14:Pink,  Pale] Necrotic Amount: [12:N/A] [13:Large (67-100%)] [14:Small (1-33%)] Necrotic Tissue: [12:Eschar] [13:Eschar] [14:Adherent Slough] Exposed Structures: [12:Fascia: No Fat: No Tendon: No] [13:Fascia: No Fat: No Tendon: No] [14:Fascia: No Fat: No Tendon: No] Muscle: No Muscle: No Muscle: No Joint: No Joint: No Joint: No Bone: No Bone: No Bone: No Limited to Skin Limited to Skin Limited to Skin Breakdown Breakdown Breakdown Epithelialization: None None None Periwound Skin Texture: Edema: No Edema: Yes Edema: Yes Excoriation: No Excoriation: No Excoriation: No Induration: No Induration: No Induration: No Callus: No Callus: No Callus: No Crepitus: No Crepitus: No Crepitus: No Fluctuance: No Fluctuance: No Fluctuance: No Friable: No Friable: No Friable: No Rash: No Rash: No Rash: No Scarring: No Scarring: No Scarring: No Periwound Skin Dry/Scaly: Yes Dry/Scaly: Yes Moist: Yes Moisture: Maceration: No Maceration: No Maceration: No Moist: No Moist: No Dry/Scaly: No Periwound Skin Color: Atrophie Blanche: No Atrophie Blanche: No Atrophie Blanche: No Cyanosis: No Cyanosis: No Cyanosis: No Ecchymosis: No Ecchymosis: No Ecchymosis: No Erythema: No Erythema: No Erythema: No Hemosiderin Staining: No Hemosiderin Staining: No Hemosiderin Staining: No Mottled: No Mottled: No Mottled: No Pallor: No Pallor: No Pallor: No Rubor: No Rubor: No Rubor: No Temperature: No Abnormality No Abnormality No Abnormality Tenderness on Yes Yes Yes Palpation: Wound Preparation: Ulcer Cleansing: Ulcer Cleansing: Ulcer Cleansing: Rinsed/Irrigated with Rinsed/Irrigated with Rinsed/Irrigated with Saline Saline Saline Topical Anesthetic Topical Anesthetic Topical Anesthetic Applied: Other: lidocaine Applied: Other: lidocaine Applied: Other: lidocaine 4% 4% 4% Treatment Notes Electronic Signature(s) Signed: 07/23/2015 4:31:50 PM By: Regan Lemming BSN, RN Entered By:  Regan Lemming on 07/23/2015 15:11:59 Nine, [name] ([id number]) -------------------------------------------------------------------------------- [place] Details Patient Name: [name]. Date of Service: 07/23/2015 3:00 PM Medical Record Number: [id number] Patient Account Number: [id number] Date of Birth/Sex: 11-26-1932 (82 y.o. Female) Treating RN: Afful, RN, BSN, Velva Harman Primary Care Physician: Emily Filbert Other Clinician: Referring Physician: Emily Filbert Treating Physician/Extender: Frann Rider in Treatment: 11 Active Inactive Electronic Signature(s) Signed: 07/23/2015 4:31:50 PM By: Regan Lemming BSN, RN Entered By: Regan Lemming on 07/23/2015 15:09:41 Fonseca, [name] ([id number]) -------------------------------------------------------------------------------- Pain Assessment Details Patient Name: [name]. Date of Service: 07/23/2015 3:00 PM Medical Record Number: [id number] Patient Account Number: [id number] Date of Birth/Sex: Aug 14, 1932 (82 y.o. Female) Treating RN: Baruch Gouty, RN, BSN, Velva Harman Primary Care Physician: Emily Filbert Other Clinician: Referring Physician: Emily Filbert Treating Physician/Extender: Frann Rider in Treatment: 11 Active Problems Location of Pain Severity and Description of Pain Patient Has Paino No Site Locations Pain Management and Medication Current Pain Management: Electronic Signature(s) Signed: 07/23/2015 4:31:50 PM By: Regan Lemming BSN, RN Entered By: Regan Lemming on 07/23/2015 15:07:12 Selle, [name] ([id number]) -------------------------------------------------------------------------------- Patient/Caregiver Education Details Patient Name: [name] Date of Service: 07/23/2015 3:00 PM Medical Record Number: [id number] Patient Account Number: [id number]  Date of Birth/Gender: [birth year] (82 y.o. Female) Treating RN: Baruch Gouty, RN, BSN, Velva Harman Primary Care Physician: Emily Filbert Other Clinician: Referring Physician: Emily Filbert Treating Physician/Extender: Frann Rider in Treatment: 11 Education Assessment Education Provided To: Patient and Caregiver Education Topics Provided Wound Debridement: Methods: Explain/Verbal Responses: State content correctly Wound/Skin Impairment: Methods: Explain/Verbal Responses: State content correctly Electronic Signature(s) Signed: 07/23/2015 3:43:53 PM By: Regan Lemming BSN, RN Entered By: Regan Lemming on 07/23/2015 15:43:53 Salvetti, [name] ([id number]) -------------------------------------------------------------------------------- Wound Assessment Details Patient Name: [name]. Date of Service: 07/23/2015 3:00 PM Medical Record Number: [id number] Patient Account Number: [id number] Date of Birth/Sex: 14-Feb-1933 (82 y.o. Female) Treating RN: Afful, RN, BSN, [place] Primary Care Physician: Emily Filbert Other Clinician: Referring Physician: Emily Filbert Treating Physician/Extender: Frann Rider in Treatment: 11 Wound Status Wound Number: 12 Primary Skin Tear Etiology: Wound Location: Right Lower Leg - Anterior, Proximal Wound Open Status: Wounding Event: Gradually Appeared Comorbid Cataracts, Anemia, Arrhythmia, Date Acquired: 07/11/2015 History: Hypertension, Osteoarthritis, Received Weeks Of Treatment: 0 Radiation Clustered Wound: No Photos Photo Uploaded By: Regan Lemming on 07/23/2015 16:30:10 Wound Measurements Length: (cm) 1.5 % Reduction in Width: (cm) 1.5 % Reduction in Depth: (cm) 0.1 Epithelializat Area: (cm) 1.767 Tunneling: Volume: (cm) 0.177 Undermining: Area: 0% Volume: 0% ion: None No No Wound Description Classification: Unclassifiable Wound Margin: Distinct, outline attached Exudate Amount: None Present Foul Odor After Cleansing: No Wound Bed Necrotic Amount: Large (67-100%) Exposed Structure Necrotic Quality: Eschar Fascia Exposed: No Fat Layer Exposed: No Tendon Exposed: No Muscle Exposed: No Mantione, Emmalyne A.  ([id number]) Joint Exposed: No Bone Exposed: No Limited to Skin Breakdown Periwound Skin Texture Texture Color No Abnormalities Noted: No No Abnormalities Noted: No Callus: No Atrophie Blanche: No Crepitus: No Cyanosis: No Excoriation: No Ecchymosis: No Fluctuance: No Erythema: No Friable: No Hemosiderin Staining: No Induration: No Mottled: No Localized Edema: No Pallor: No Rash: No Rubor: No Scarring: No Temperature / Pain Moisture Temperature: No Abnormality No Abnormalities Noted: No Tenderness on Palpation: Yes Dry / Scaly: Yes Maceration: No Moist: No Wound Preparation Ulcer Cleansing: Rinsed/Irrigated with Saline Topical Anesthetic Applied: Other: lidocaine 4%, Treatment Notes Wound #12 (Right, Proximal, Anterior Lower Leg) 1. Cleansed with: Clean wound with Normal Saline 3. Peri-wound Care: Barrier cream 4. Dressing Applied: Aquacel Ag 5. Secondary Dressing Applied Dry Gauze 7. Secured with 2 Layer Lite Compression System - Right Lower Extremity Electronic Signature(s) Signed: 07/23/2015 4:31:50 PM By: Regan Lemming BSN, RN Entered By: Regan Lemming on 07/23/2015 15:08:59 Armand, [name] ([id number]) -------------------------------------------------------------------------------- Wound Assessment Details Patient Name: [name]. Date of Service: 07/23/2015 3:00 PM Medical Record Number: [id number] Patient Account Number: [id number] Date of Birth/Sex: [birth year] (82 y.o. Female) Treating RN: Afful, RN, BSN, [place] Primary Care Physician: Emily Filbert Other Clinician: Referring Physician: Emily Filbert Treating Physician/Extender: Frann Rider in Treatment: 11 Wound Status Wound Number: 13 Primary Skin Tear Etiology: Wound Location: Right Lower Leg - Anterior, Distal Wound Open Status: Wounding Event: Gradually Appeared Comorbid Cataracts, Anemia, Arrhythmia, Date Acquired: 07/11/2015 History: Hypertension, Osteoarthritis, Received Weeks Of  Treatment: 0 Radiation Clustered Wound: No Photos Photo Uploaded By: Regan Lemming on 07/23/2015 16:30:40 Wound Measurements Length: (cm) 2 % Reduction in Width: (cm) 2.5 % Reduction in Depth: (cm) 0.1 Epithelializat Area: (cm) 3.927 Tunneling: Volume: (cm) 0.393 Undermining: Area: 0% Volume: 0% ion: None No No Wound Description Classification: Unclassifiable Wound Margin: Distinct, outline attached Exudate Amount: None Present Foul Odor After Cleansing: No Wound Bed Granulation Amount: None Present (0%)  Exposed Structure Necrotic Amount: Large (67-100%) Fascia Exposed: No Necrotic Quality: Eschar Fat Layer Exposed: No Tendon Exposed: No Muscle Exposed: No [place], Bricia A. ([id number]) Joint Exposed: No Bone Exposed: No Limited to Skin Breakdown Periwound Skin Texture Texture Color No Abnormalities Noted: No No Abnormalities Noted: No Callus: No Atrophie Blanche: No Crepitus: No Cyanosis: No Excoriation: No Ecchymosis: No Fluctuance: No Erythema: No Friable: No Hemosiderin Staining: No Induration: No Mottled: No Localized Edema: Yes Pallor: No Rash: No Rubor: No Scarring: No Temperature / Pain Moisture Temperature: No Abnormality No Abnormalities Noted: No Tenderness on Palpation: Yes Dry / Scaly: Yes Maceration: No Moist: No Wound Preparation Ulcer Cleansing: Rinsed/Irrigated with Saline Topical Anesthetic Applied: Other: lidocaine 4%, Treatment Notes Wound #13 (Right, Distal, Anterior Lower Leg) 1. Cleansed with: Clean wound with Normal Saline 3. Peri-wound Care: Barrier cream 4. Dressing Applied: Aquacel Ag 5. Secondary Dressing Applied Dry Gauze 7. Secured with 2 Layer Lite Compression System - Right Lower Extremity Electronic Signature(s) Signed: 07/23/2015 4:31:50 PM By: Regan Lemming BSN, RN Entered By: Regan Lemming on 07/23/2015 15:09:10 Weisensel, [name]  ([id number]) -------------------------------------------------------------------------------- Wound Assessment Details Patient Name: [name]. Date of Service: 07/23/2015 3:00 PM Medical Record Number: [id number] Patient Account Number: [id number] Date of Birth/Sex: 10/24/1932 (82 y.o. Female) Treating RN: Afful, RN, BSN, [place] Primary Care Physician: Emily Filbert Other Clinician: Referring Physician: Emily Filbert Treating Physician/Extender: Frann Rider in Treatment: 11 Wound Status Wound Number: 14 Primary Atypical Etiology: Wound Location: Right Lumbar spine - Lateral Wound Open Wounding Event: Gradually Appeared Status: Date Acquired: 07/11/2015 Comorbid Cataracts, Anemia, Arrhythmia, Weeks Of Treatment: 0 History: Hypertension, Osteoarthritis, Received Clustered Wound: No Radiation Photos Photo Uploaded By: Regan Lemming on 07/23/2015 16:30:41 Wound Measurements Length: (cm) 0.5 % Reduction in Width: (cm) 0.5 % Reduction in Depth: (cm) 0.1 Epithelializat Area: (cm) 0.196 Tunneling: Volume: (cm) 0.02 Area: 0% Volume: 0% ion: None No Wound Description Classification: Partial Thickness Wound Margin: Distinct, outline attached Exudate Amount: Small Exudate Type: Serosanguineous Vigeant, Zakariah AMarland Kitchen ([id number]) Foul Odor After Cleansing: No Exudate Color: red, brown Wound Bed Granulation Amount: Medium (34-66%) Exposed Structure Granulation Quality: Pink, Pale Fascia Exposed: No Necrotic Amount: Small (1-33%) Fat Layer Exposed: No Necrotic Quality: Adherent Slough Tendon Exposed: No Muscle Exposed: No Joint Exposed: No Bone Exposed: No Limited to Skin Breakdown Periwound Skin Texture Texture Color No Abnormalities Noted: No No Abnormalities Noted: No Callus: No Atrophie Blanche: No Crepitus: No Cyanosis: No Excoriation: No Ecchymosis: No Fluctuance: No Erythema: No Friable: No Hemosiderin Staining: No Induration: No Mottled: No Localized Edema:  Yes Pallor: No Rash: No Rubor: No Scarring: No Temperature / Pain Moisture Temperature: No Abnormality No Abnormalities Noted: No Tenderness on Palpation: Yes Dry / Scaly: No Maceration: No Moist: Yes Wound Preparation Ulcer Cleansing: Rinsed/Irrigated with Saline Topical Anesthetic Applied: Other: lidocaine 4%, Electronic Signature(s) Signed: 07/23/2015 4:31:50 PM By: Regan Lemming BSN, RN Entered By: Regan Lemming on 07/23/2015 15:09:22 [name] ([id number]) -------------------------------------------------------------------------------- Vitals Details Patient Name: [name]. Date of Service: 07/23/2015 3:00 PM Medical Record Number: [id number] Patient Account Number: [id number] Date of Birth/Sex: [birth year] (82 y.o. Female) Treating RN: Afful, RN, BSN, [place] Primary Care Physician: Emily Filbert Other Clinician: Referring Physician: Emily Filbert Treating Physician/Extender: Frann Rider in Treatment: 11 Vital Signs Time Taken: 14:56 Temperature (F): 97.7 Height (in): 68 Pulse (bpm): 53 Weight (lbs): 150 Respiratory Rate (breaths/min): 17 Body Mass Index (BMI): 22.8 Blood Pressure (mmHg): 125/41 Reference Range: 80 - 120 mg / dl  Electronic Signature(s) Signed: 07/23/2015 4:31:50 PM By: Regan Lemming BSN, RN Entered By: Regan Lemming on 07/23/2015 15:07:21

## 2015-07-24 NOTE — Progress Notes (Addendum)
JAIEL, HOWER ([id number]) Visit Report for 07/23/2015 Chief Complaint Document Details Patient Name: [name], [name]. Date of Service: 07/23/2015 3:00 PM Medical Record Patient Account Number: [id number] Number: Afful, RN, BSN, Treating RN: October 22, 1932 (82 y.o. Velva Harman Date of Birth/Sex: Female) Other Clinician: Primary Care Physician: Emily Filbert Treating Christin Fudge Referring Physician: Emily Filbert Physician/Extender: Suella Grove in Treatment: 11 Information Obtained from: Patient Chief Complaint has a new open wound to the right lower extremity where she hit her leg on the bath tub about a week and a half ago Electronic Signature(s) Signed: 07/23/2015 3:22:45 PM By: Christin Fudge MD, FACS Previous Signature: 07/23/2015 3:00:52 PM Version By: Christin Fudge MD, FACS Entered By: Christin Fudge on 07/23/2015 15:22:45 [name], [name] ([id number]) -------------------------------------------------------------------------------- Debridement Details Patient Name: [name]. Date of Service: 07/23/2015 3:00 PM Medical Record Patient Account Number: [id number] Number: Afful, RN, BSN, Treating RN: 03-11-1933 (82 y.o. Velva Harman Date of Birth/Sex: Female) Other Clinician: Primary Care Physician: Emily Filbert Treating Christin Fudge Referring Physician: Emily Filbert Physician/Extender: Suella Grove in Treatment: 11 Debridement Performed for Wound #12 Right,Proximal,Anterior Lower Leg Assessment: Performed By: Physician Ricard Dillon, MD Debridement: Debridement Pre-procedure Yes Verification/Time Out Taken: Start Time: 15:09 Pain Control: Lidocaine 4% Topical Solution Level: Skin/Subcutaneous Tissue Total Area Debrided (L x 1.5 (cm) x 1.5 (cm) = 2.25 (cm) W): Tissue and other Viable, Non-Viable, Blood Clots, Exudate, Fibrin/Slough, Skin, Subcutaneous material debrided: Instrument: Forceps Bleeding: Minimum Hemostasis Achieved: Pressure End Time: 15:12 Procedural Pain: 0 Post  Procedural Pain: 0 Response to Treatment: Procedure was tolerated well Post Debridement Measurements of Total Wound Length: (cm) 1.5 Width: (cm) 1.5 Depth: (cm) 0.3 Volume: (cm) 0.53 Post Procedure Diagnosis Same as Pre-procedure Electronic Signature(s) Signed: 07/23/2015 3:31:28 PM By: Christin Fudge MD, FACS Signed: 07/23/2015 4:31:50 PM By: Regan Lemming BSN, RN Previous Signature: 07/23/2015 3:22:06 PM Version By: Christin Fudge MD, FACS Entered By: Christin Fudge on 07/23/2015 15:31:28 [name], [name] ([id number]) [name], [name] ([id number]) -------------------------------------------------------------------------------- Debridement Details Patient Name: [name]. Date of Service: 07/23/2015 3:00 PM Medical Record Patient Account Number: [id number] Number: Afful, RN, BSN, Treating RN: [birth year] (82 y.o. Velva Harman Date of Birth/Sex: Female) Other Clinician: Primary Care Physician: Emily Filbert Treating Christin Fudge Referring Physician: Emily Filbert Physician/Extender: Suella Grove in Treatment: 11 Debridement Performed for Wound #13 Right,Distal,Anterior Lower Leg Assessment: Performed By: Physician Ricard Dillon, MD Debridement: Debridement Pre-procedure Yes Verification/Time Out Taken: Start Time: 15:12 Pain Control: Lidocaine 4% Topical Solution Level: Skin/Subcutaneous Tissue Total Area Debrided (L x 2 (cm) x 2.5 (cm) = 5 (cm) W): Tissue and other Viable, Non-Viable, Blood Clots, Exudate, Fibrin/Slough, Subcutaneous material debrided: Instrument: Forceps Bleeding: Minimum Hemostasis Achieved: Pressure End Time: 15:16 Procedural Pain: 0 Post Procedural Pain: 0 Response to Treatment: Procedure was tolerated well Post Debridement Measurements of Total Wound Length: (cm) 2 Width: (cm) 2.5 Depth: (cm) 0.1 Volume: (cm) 0.393 Post Procedure Diagnosis Same as Pre-procedure Electronic Signature(s) Signed: 07/23/2015 3:31:39 PM By: Christin Fudge MD, FACS Signed:  07/23/2015 4:31:50 PM By: Regan Lemming BSN, RN Previous Signature: 07/23/2015 3:22:17 PM Version By: Christin Fudge MD, FACS Entered By: Christin Fudge on 07/23/2015 15:31:39 [name], [name] ([id number]) [name], [name] ([id number]) -------------------------------------------------------------------------------- HPI Details Patient Name: [name]. Date of Service: 07/23/2015 3:00 PM Medical Record Patient Account Number: [id number] Number: Afful, RN, BSN, Treating RN: 1933-04-09 (82 y.o. Velva Harman Date of Birth/Sex: Female) Other Clinician: Primary Care Physician: Emily Filbert Treating Christin Fudge Referring Physician: Emily Filbert Physician/Extender: Suella Grove  in Treatment: 11 History of Present Illness Location: right lower extremity and right ankle Quality: Patient reports experiencing a sharp pain to affected area(s). Severity: Patient states wound are getting worse. Duration: Patient has had the wound for < 2 weeks prior to presenting for treatment Timing: Pain in wound is constant (hurts all the time) Context: The wound occurred when the patient bumped her right lower extremity against the bathtub Modifying Factors: Other treatment(s) tried include:has been trying to apply local ointment at home Associated Signs and Symptoms: Patient reports having increase swelling. HPI Description: 82 year old patient who is known to the wound center for several years now has a new injury to the right lower extremity where she bumped herself against the bath tub. He also has had a chronic right lateral malleolus open wound which has been coming on and off for several years now. This has been there for at least 2014 and was healed out in November 2016. In the past she has been seen by her cardiologist Dr. Kathlyn Sacramento in April 2016. Noninvasive vascular evaluation showed an ABI of 0.68 on the right and 0.85 on the left. 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 One-vessel runoff below the knee via the peroneal artery with short occlusion of the anterior tibial artery and reconstitution. No intervention performed. Most recently she was seen for a wound on her right lower extremity which was healed out by March 15 She is not a smoker and has had no diabetes mellitus. Electronic Signature(s) Signed: 07/23/2015 3:26:41 PM By: Christin Fudge MD, FACS Previous Signature: 07/23/2015 3:01:02 PM Version By: Christin Fudge MD, FACS Entered By: Christin Fudge on 07/23/2015 15:26:40 [name], [name] ([id number]) -------------------------------------------------------------------------------- Physical Exam Details Patient Name: [name]. Date of Service: 07/23/2015 3:00 PM Medical Record Patient Account Number: [id number] Number: Afful, RN, BSN, Treating RN: December 19, 1932 (82 y.o. Velva Harman Date of Birth/Sex: Female) Other Clinician: Primary Care Physician: Emily Filbert Treating Christin Fudge Referring Physician: Emily Filbert Physician/Extender: Suella Grove in Treatment: 11 Constitutional . Pulse regular. Respirations normal and unlabored. Afebrile. . Eyes Nonicteric. Reactive to light. Ears, Nose, Mouth, and Throat Lips, teeth, and gums WNL.Marland Kitchen Moist mucosa without lesions. Neck supple and nontender. No palpable supraclavicular or cervical adenopathy. Normal sized without goiter. Respiratory WNL. No retractions.. Cardiovascular Pedal Pulses WNL. No clubbing, cyanosis or edema. Lymphatic No adneopathy. No adenopathy. No adenopathy. Musculoskeletal Adexa without tenderness or enlargement.. Digits and nails w/o clubbing, cyanosis, infection, petechiae, ischemia, or inflammatory conditions.. Integumentary (Hair, Skin) No suspicious lesions. No crepitus or fluctuance. No peri-wound warmth or erythema. No masses.Marland Kitchen Psychiatric Judgement and insight Intact.. No evidence of depression, anxiety, or agitation.. Notes right lateral  malleolus has the eschar and a very shallow ulcer. The right anterior calf and shin ray area as to areas of hematoma and eschar and this will need sharp debridement and I abuse moist saline gauze and a forcep and debrided the hematoma down to the subcutaneous tissue and also removed skin and subcutaneous tissue of both wounds. Brisk bleeding controlled with pressure. Electronic Signature(s) Signed: 07/23/2015 3:27:45 PM By: Christin Fudge MD, FACS Previous Signature: 07/23/2015 3:01:33 PM Version By: Christin Fudge MD, FACS Entered By: Christin Fudge on 07/23/2015 15:27:45 [name], [name] ([id number]) [name], [name] ([id number]) -------------------------------------------------------------------------------- Physician Orders Details Patient Name: [name]. Date of Service: 07/23/2015 3:00 PM Medical Record Patient Account Number: [id number] Number: Treating RN: Baruch Gouty, RN, BSN, Rita [birth year] [phone] y.o. Other  Clinician: Date of Birth/Sex: Female) Treating ROBSON, MICHAEL Primary Care Physician/Extender: Claudette Laws Physician: Referring Physician: Melina Modena in Treatment: 11 Verbal / Phone Orders: Yes Clinician: Afful, RN, BSN, Rita Read Back and Verified: Yes Diagnosis Coding ICD-10 Coding Code Description S81.801A Unspecified open wound, right lower leg, initial encounter I48.2 Chronic atrial fibrillation L03.115 Cellulitis of right lower limb Wound Cleansing Wound #12 Right,Proximal,Anterior Lower Leg o Cleanse wound with mild soap and water o May Shower, gently pat wound dry prior to applying new dressing. o May shower with protection. Wound #13 Right,Distal,Anterior Lower Leg o Cleanse wound with mild soap and water o May Shower, gently pat wound dry prior to applying new dressing. o May shower with protection. Wound #14 Right,Lateral Malleolus o Cleanse wound with mild soap and water o May Shower, gently pat wound dry prior to applying new  dressing. o May shower with protection. Anesthetic Wound #12 Right,Proximal,Anterior Lower Leg o Topical Lidocaine 4% cream applied to wound bed prior to debridement Wound #13 Right,Distal,Anterior Lower Leg o Topical Lidocaine 4% cream applied to wound bed prior to debridement Wound #14 Right,Lateral Malleolus o Topical Lidocaine 4% cream applied to wound bed prior to debridement Skin Barriers/Peri-Wound Care [name], [name] ([id number]) Wound #12 Right,Proximal,Anterior Lower Leg o Barrier cream Wound #13 Right,Distal,Anterior Lower Leg o Barrier cream Wound #14 Right,Lateral Malleolus o Barrier cream Primary Wound Dressing Wound #12 Right,Proximal,Anterior Lower Leg o Aquacel Ag Wound #13 Right,Distal,Anterior Lower Leg o Aquacel Ag Wound #14 Right,Lateral Malleolus o Aquacel Ag Secondary Dressing Wound #12 Right,Proximal,Anterior Lower Leg o Dry Gauze Wound #13 Right,Distal,Anterior Lower Leg o Dry Gauze Wound #14 Right,Lateral Malleolus o Dry Gauze Dressing Change Frequency Wound #12 Right,Proximal,Anterior Lower Leg o Change dressing every other day. Wound #13 Right,Distal,Anterior Lower Leg o Change dressing every other day. Wound #14 Right,Lateral Malleolus o Change dressing every other day. Follow-up Appointments Wound #12 Right,Proximal,Anterior Lower Leg o Return Appointment in 1 week. Wound #13 Right,Distal,Anterior Lower Leg o Return Appointment in 1 week. Wound #14 Right,Lateral Malleolus o Return Appointment in 1 week. [name], [name] ([id number]) Edema Control o 2 Layer Lite Compression System - Right Lower Extremity Electronic Signature(s) Signed: 07/23/2015 4:31:50 PM By: Regan Lemming BSN, RN Signed: 08/24/2015 7:24:50 AM By: Linton Ham MD Entered By: Regan Lemming on 07/23/2015 15:18:26 [name], [name] ([id number]) -------------------------------------------------------------------------------- Problem List  Details Patient Name: [name]. Date of Service: 07/23/2015 3:00 PM Medical Record Patient Account Number: [id number] Number: Afful, RN, BSN, Treating RN: [birth year] (82 y.o. 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 S81.811A Laceration without foreign body, right lower leg, initial 07/23/2015 Yes encounter Inactive Problems Resolved Problems ICD-10 Code Description Active Date Resolved Date S81.801A Unspecified open wound, right lower leg, initial encounter 05/05/2015 05/05/2015 L03.115 Cellulitis of right lower limb 05/05/2015 05/05/2015 Electronic Signature(s) Signed: 07/23/2015 3:21:41 PM By: Christin Fudge MD, FACS Previous Signature: 07/23/2015 3:04:13 PM Version By: Christin Fudge MD, FACS Previous Signature: 07/23/2015 3:00:35 PM Version By: Christin Fudge MD, FACS [name], [name] ([id number]) Entered By: Christin Fudge on 07/23/2015 15:21:41 Deschler, [name] ([id number]) -------------------------------------------------------------------------------- Progress Note Details Patient Name: [name].  Date of Service: 07/23/2015 3:00 PM Medical Record Patient Account Number: [id number] Number: Afful, RN, BSN, Treating RN: March 14, 1933 (82 y.o. Velva Harman Date of Birth/Sex: Female) Other Clinician: Primary Care Physician: Emily Filbert Treating Christin Fudge Referring Physician: Emily Filbert Physician/Extender: Suella Grove in Treatment: 11 Subjective Chief Complaint Information obtained from Patient has a new open wound to the right  lower extremity where she hit her leg on the bath tub about a week and a half ago History of Present Illness (HPI) The following HPI elements were documented for the patient's wound: Location: right lower extremity and right ankle Quality: Patient reports experiencing a sharp pain to affected area(s). Severity: Patient states wound are getting worse. Duration: Patient has had the wound for < 2 weeks prior to presenting for treatment Timing: Pain in wound is constant (hurts all the time) Context: The wound occurred when the patient bumped her right lower extremity against the bathtub Modifying Factors: Other treatment(s) tried include:has been trying to apply local ointment at home Associated Signs and Symptoms: Patient reports having increase swelling. 82 year old patient who is known to the wound center for several years now has a new injury to the right lower extremity where she bumped herself against the bath tub. He also has had a chronic right lateral malleolus open wound which has been coming on and off for several years now. This has been there for at least 2014 and was healed out in November 2016. In the past she has been seen by her cardiologist Dr. Kathlyn Sacramento in April 2016. Noninvasive vascular evaluation showed an ABI of 0.68 on the right and 0.85 on the left. 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 One-vessel runoff below the knee via the peroneal artery with short occlusion of the anterior tibial artery and reconstitution. No intervention performed. Most recently she was seen for a wound on her right lower extremity which was healed out by March 15 She is not a smoker and has had no diabetes mellitus. Medications aspirin 81 mg tablet,delayed release oral 1 1 tablet,delayed release (DR/EC) oral pravastatin 40 mg tablet oral 1 1 tablet oral anastrozole 1 mg tablet oral 1 1 tablet oral Johannesen, Aleeza A.  ([id number]) metoprolol tartrate 100 mg tablet oral 1 1 tablet oral diltiazem 90 mg tablet oral 1 1 tablet oral calcium carbonate 600 mg (1,500 mg) tablet oral 1 1 tablet oral cefixime 400 mg capsule oral 1 1 capsule oral Vitron-C 65 mg iron-125 mg tablet,delayed release oral 1 1 tablet,delayed release (DR/EC) oral docusate sodium 100 mg tablet oral 1 1 tablet oral clindamycin 300 mg capsule oral 300mg  po tid x 1 week torsemide 20 mg tablet oral 1 1 tablet oral multivitamin tablet oral 1 1 tablet oral potassium chloride ER 10 mEq tablet,extended release oral 1 1 tablet extended release oral omeprazole 40 mg capsule,delayed release oral 1 1 capsule,delayed release(DR/EC) oral tamoxifen 20 mg tablet oral 1 1 tablet oral levothyroxine 150 mcg tablet oral 1 1 tablet oral Santyl 250 unit/gram topical ointment topical ointment topical Objective Constitutional Pulse regular. Respirations normal and unlabored. Afebrile. Vitals Time Taken: 2:56 PM, Height: 68 in, Weight: 150 lbs, BMI: 22.8, Temperature: 97.7 F, Pulse: 53 bpm, Respiratory Rate: 17 breaths/min, Blood Pressure: 125/41 mmHg. Eyes Nonicteric. Reactive to light. Ears, Nose, Mouth, and Throat Lips, teeth, and gums WNL.Marland Kitchen Moist mucosa without lesions. Neck supple and nontender. No palpable supraclavicular or cervical adenopathy. Normal sized without goiter. Respiratory WNL. No  retractions.. Cardiovascular Pedal Pulses WNL. No clubbing, cyanosis or edema. Lymphatic No adneopathy. No adenopathy. No adenopathy. Musculoskeletal Adexa without tenderness or enlargement.. Digits and nails w/o clubbing, cyanosis, infection, petechiae, ischemia, or inflammatory conditions.Leonides Schanz, [name] ([id number]) Psychiatric Judgement and insight Intact.. No evidence of depression, anxiety, or agitation.. General Notes: right lateral malleolus has the eschar and a very shallow ulcer. The right anterior calf and shin ray area as to areas of hematoma and  eschar and this will need sharp debridement and I abuse moist saline gauze and a forcep and debrided the hematoma down to the subcutaneous tissue and also removed skin and subcutaneous tissue of both wounds. Brisk bleeding controlled with pressure. Integumentary (Hair, Skin) No suspicious lesions. No crepitus or fluctuance. No peri-wound warmth or erythema. No masses.. Wound #12 status is Open. Original cause of wound was Gradually Appeared. The wound is located on the Right,Proximal,Anterior Lower Leg. The wound measures 1.5cm length x 1.5cm width x 0.1cm depth; 1.767cm^2 area and 0.177cm^3 volume. The wound is limited to skin breakdown. There is no tunneling or undermining noted. There is a none present amount of drainage noted. The wound margin is distinct with the outline attached to the wound base. There is a large (67-100%) amount of necrotic tissue within the wound bed including Eschar. The periwound skin appearance exhibited: Dry/Scaly. The periwound skin appearance did not exhibit: Callus, Crepitus, Excoriation, Fluctuance, Friable, Induration, Localized Edema, Rash, Scarring, Maceration, Moist, Atrophie Blanche, Cyanosis, Ecchymosis, Hemosiderin Staining, Mottled, Pallor, Rubor, Erythema. Periwound temperature was noted as No Abnormality. The periwound has tenderness on palpation. Wound #13 status is Open. Original cause of wound was Gradually Appeared. The wound is located on the Right,Distal,Anterior Lower Leg. The wound measures 2cm length x 2.5cm width x 0.1cm depth; 3.927cm^2 area and 0.393cm^3 volume. The wound is limited to skin breakdown. There is no tunneling or undermining noted. There is a none present amount of drainage noted. The wound margin is distinct with the outline attached to the wound base. There is no granulation within the wound bed. There is a large (67-100%) amount of necrotic tissue within the wound bed including Eschar. The periwound skin  appearance exhibited: Localized Edema, Dry/Scaly. The periwound skin appearance did not exhibit: Callus, Crepitus, Excoriation, Fluctuance, Friable, Induration, Rash, Scarring, Maceration, Moist, Atrophie Blanche, Cyanosis, Ecchymosis, Hemosiderin Staining, Mottled, Pallor, Rubor, Erythema. Periwound temperature was noted as No Abnormality. The periwound has tenderness on palpation. Wound #14 status is Open. Original cause of wound was Gradually Appeared. The wound is located on the Right,Lateral Malleolus. The wound measures 0.5cm length x 0.5cm width x 0.1cm depth; 0.196cm^2 area and 0.02cm^3 volume. The wound is limited to skin breakdown. There is no tunneling noted. There is a small amount of serosanguineous drainage noted. The wound margin is distinct with the outline attached to the wound base. There is medium (34-66%) pink, pale granulation within the wound bed. There is a small (1-33%) amount of necrotic tissue within the wound bed including Adherent Slough. The periwound skin appearance exhibited: Localized Edema, Moist. The periwound skin appearance did not exhibit: Callus, Crepitus, Excoriation, Fluctuance, Friable, Induration, Rash, Scarring, Dry/Scaly, Maceration, Atrophie Blanche, Cyanosis, Ecchymosis, Hemosiderin Staining, Mottled, Pallor, Rubor, Erythema. Periwound temperature was noted as No Abnormality. The periwound has tenderness on palpation. 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 She has been worked up for  arteriosclerosis of a right lower extremity and no intervention was done in May 2015. her angiogram was done by her cardiologist Dr. Annia Belt. For her new wounds I have recommended: 1. Silver alginate to be applied to the right anterior calf, shin and the right lateral malleolus. 2. since her ABIs could not be measured I have recommended we do a light Kerlix and Coban and an average recommended elevation as much as possible 3. Review by her cardiologist who may want to repeat a arterial duplex study to see the arterial flow to her right lower extremity as the last angiogram was done about a year ago. 4. We'll see her back next week for review Procedures Wound #12 Wound #12 is a Skin Tear located on the Right,Proximal,Anterior Lower Leg . There was a Skin/Subcutaneous Tissue Debridement [id number]) debridement with total area of 2.25 sq cm performed by Ricard Dillon, MD. with the following instrument(s): Forceps to remove Viable and Non-Viable tissue/material including Blood Clots, Exudate, Fibrin/Slough, Skin, and Subcutaneous after achieving pain control using Lidocaine 4% Topical Solution. A time out was conducted prior to the start of the procedure. A Minimum amount of bleeding was controlled with Pressure. The procedure was tolerated well with a pain level of 0 throughout and a pain level of 0 following the procedure. Post Debridement Measurements: 1.5cm length x 1.5cm width x 0.3cm depth; 0.53cm^3 volume. Post procedure Diagnosis Wound #12: Same as Pre-Procedure Wound #13 Wound #13 is a Skin Tear located on the Right,Distal,Anterior Lower Leg . There was a Skin/Subcutaneous Tissue Debridement [id number]) debridement with total area of 5 sq cm performed by Ricard Dillon, MD. with the following instrument(s): Forceps to remove Viable and Non-Viable tissue/material including Blood Clots, Exudate, Fibrin/Slough, and Subcutaneous after achieving pain control using  Lidocaine 4% Topical Solution. A time out was conducted prior to the start of the procedure. A Minimum amount of bleeding was controlled with Pressure. The procedure was tolerated well with a pain level of 0 throughout and a pain level of 0 following the procedure. Post Debridement Measurements: 2cm length x 2.5cm width x 0.1cm depth; 0.393cm^3 volume. EMMETT, GOLDWASSER ([id number]) Post procedure Diagnosis Wound #13: Same as Pre-Procedure Plan Wound Cleansing: Wound #12 Right,Proximal,Anterior Lower Leg: Cleanse wound with mild soap and water May Shower, gently pat wound dry prior to applying new dressing. May shower with protection. Wound #13 Right,Distal,Anterior Lower Leg: Cleanse wound with mild soap and water May Shower, gently pat wound dry prior to applying new dressing. May shower with protection. Wound #14 Right,Lateral Malleolus: Cleanse wound with mild soap and water May Shower, gently pat wound dry prior to applying new dressing. May shower with protection. Anesthetic: Wound #12 Right,Proximal,Anterior Lower Leg: Topical Lidocaine 4% cream applied to wound bed prior to debridement Wound #13 Right,Distal,Anterior Lower Leg: Topical Lidocaine 4% cream applied to wound bed prior to debridement Wound #14 Right,Lateral Malleolus: Topical Lidocaine 4% cream applied to wound bed prior to debridement Skin Barriers/Peri-Wound Care: Wound #12 Right,Proximal,Anterior Lower Leg: Barrier cream Wound #13 Right,Distal,Anterior Lower Leg: Barrier cream Wound #14 Right,Lateral Malleolus: Barrier cream Primary Wound Dressing: Wound #12 Right,Proximal,Anterior Lower Leg: Aquacel Ag Wound #13 Right,Distal,Anterior Lower Leg: Aquacel Ag Wound #14 Right,Lateral Malleolus: Aquacel  Ag Secondary Dressing: Wound #12 Right,Proximal,Anterior Lower Leg: Dry Gauze Wound #13 Right,Distal,Anterior Lower Leg: Dry Gauze Wound #14 Right,Lateral Malleolus: Dry Gauze Churilla, Gracelin A.  ([id number]) Dressing Change Frequency: Wound #12 Right,Proximal,Anterior Lower Leg: Change dressing every other day. Wound #13 Right,Distal,Anterior Lower Leg: Change dressing every other day. Wound #14 Right,Lateral Malleolus: Change dressing every other day. Follow-up Appointments: Wound #12 Right,Proximal,Anterior Lower Leg: Return Appointment in 1 week. Wound #13 Right,Distal,Anterior Lower Leg: Return Appointment in 1 week. Wound #14 Right,Lateral Malleolus: Return Appointment in 1 week. Edema Control: 2 Layer Lite Compression System - Right Lower Extremity This patient who has been seen at the wound center on and off for several months was recently discharged less than a month ago with another unrelated problem. She has been worked up for arteriosclerosis of a right lower extremity and no intervention was done in May 2015. her angiogram was done by her cardiologist Dr. Annia Belt. For her new wounds I have recommended: 1. Silver alginate to be applied to the right anterior calf, shin and the right lateral malleolus. 2. since her ABIs could not be measured I have recommended we do a light Kerlix and Coban and an average recommended elevation as much as possible 3. Review by her cardiologist who may want to repeat a arterial duplex study to see the arterial flow to her right lower extremity as the last angiogram was done about a year ago. 4. We'll see her back next week for review Electronic Signature(s) Signed: 07/23/2015 4:33:23 PM By: Christin Fudge MD, FACS Previous Signature: 07/23/2015 3:30:36 PM Version By: Christin Fudge MD, FACS Previous Signature: 07/23/2015 3:01:47 PM Version By: Christin Fudge MD, FACS Entered By: Christin Fudge on 07/23/2015 16:33:23 Vogl, [name] ([id number]) -------------------------------------------------------------------------------- SuperBill Details Patient Name: [name]. Date of Service: 07/23/2015 Medical Record Patient Account Number:  [id number] Number: Afful, RN, BSN, Treating RN: [birth year] (82 y.o. 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 S81.811A Laceration without foreign body, right lower leg, initial encounter Facility Procedures CPT4 Code Description: [id number] - DEB SUBQ TISSUE 20 SQ CM/< ICD-10 Description Diagnosis I48.2 Chronic atrial fibrillation I70.232 Atherosclerosis of native arteries of right leg with I70.233 Atherosclerosis of native arteries of right leg with  S81.811A Laceration without foreign body, right lower leg, ini Modifier: ulceration of c ulceration of a tial encounter Quantity: 1 alf nkle Physician Procedures CPT4 Code Description: [id number] - WC PHYS LEVEL 4 - EST PT ICD-10 Description Diagnosis I70.233 Atherosclerosis of native arteries of right leg with I70.232 Atherosclerosis of native arteries of right leg with S81.811A Laceration without foreign  body, right lower leg, ini Modifier: ulceration of an ulceration of ca tial encounter Quantity: 1 kle lf CPT4 Code Description: [id number] 11042 - WC PHYS SUBQ TISS 20 SQ CM ICD-10 Description Diagnosis I48.2 Chronic atrial fibrillation I70.232 Atherosclerosis of native arteries of right leg with I70.233 Atherosclerosis of native arteries of right leg with  S81.811A Laceration without foreign body, right lower leg, ini Merriwether, Devynn A. ([id number]) Modifier: ulceration of ca ulceration of an tial encounter Quantity: 1 lf kle Electronic Signature(s) Signed: 07/23/2015 3:32:13 PM By: Christin Fudge MD, FACS Previous Signature: 07/23/2015 3:31:14 PM  Version By:  Christin Fudge MD, FACS Previous Signature: 07/23/2015 3:02:13 PM Version By: Christin Fudge MD, FACS Entered By: Christin Fudge on 07/23/2015 15:32:13

## 2015-07-26 DIAGNOSIS — Z79899 Other long term (current) drug therapy: Secondary | ICD-10-CM | POA: Diagnosis not present

## 2015-07-27 DIAGNOSIS — I739 Peripheral vascular disease, unspecified: Secondary | ICD-10-CM | POA: Diagnosis not present

## 2015-07-27 DIAGNOSIS — Z0000 Encounter for general adult medical examination without abnormal findings: Secondary | ICD-10-CM | POA: Diagnosis not present

## 2015-07-27 DIAGNOSIS — M519 Unspecified thoracic, thoracolumbar and lumbosacral intervertebral disc disorder: Secondary | ICD-10-CM | POA: Diagnosis not present

## 2015-07-29 ENCOUNTER — Encounter: Payer: PPO | Admitting: Surgery

## 2015-07-29 ENCOUNTER — Ambulatory Visit: Payer: PPO | Admitting: Cardiovascular Disease

## 2015-07-29 DIAGNOSIS — S81811A Laceration without foreign body, right lower leg, initial encounter: Secondary | ICD-10-CM | POA: Diagnosis not present

## 2015-07-29 DIAGNOSIS — I482 Chronic atrial fibrillation: Secondary | ICD-10-CM | POA: Diagnosis not present

## 2015-07-29 DIAGNOSIS — S91001A Unspecified open wound, right ankle, initial encounter: Secondary | ICD-10-CM | POA: Diagnosis not present

## 2015-07-29 DIAGNOSIS — I70233 Atherosclerosis of native arteries of right leg with ulceration of ankle: Secondary | ICD-10-CM | POA: Diagnosis not present

## 2015-07-30 NOTE — Progress Notes (Signed)
ADALAI, PATIENCE ([id number]) Visit Report for 07/29/2015 Chief Complaint Document Details Patient Name: [name], [name]. Date of Service: 07/29/2015 3:45 PM Medical Record Patient Account Number: [id number] Number: Afful, RN, BSN, Treating RN: 1932-08-13 (82 y.o. Velva Harman Date of Birth/Sex: Female) Other Clinician: Primary Care Physician: Emily Filbert Treating Christin Fudge Referring Physician: Emily Filbert Physician/Extender: Suella Grove in Treatment: 12 Information Obtained from: Patient Chief Complaint has a new open wound to the right lower extremity where she hit her leg on the bath tub about a week and a half ago Electronic Signature(s) Signed: 07/29/2015 3:58:01 PM By: Christin Fudge MD, FACS Entered By: Christin Fudge on 07/29/2015 15:58:00 Butler, [name] ([id number]) -------------------------------------------------------------------------------- HPI Details Patient Name: [name]. Date of Service: 07/29/2015 3:45 PM Medical Record Patient Account Number: [id number] Number: Afful, RN, BSN, Treating RN: 09/23/1932 (82 y.o. Velva Harman Date of Birth/Sex: Female) Other Clinician: Primary Care Physician: Emily Filbert Treating Christin Fudge Referring Physician: Emily Filbert Physician/Extender: Suella Grove in Treatment: 12 History of Present Illness Location: right lower extremity and right ankle Quality: Patient reports experiencing a sharp pain to affected area(s). Severity: Patient states wound are getting worse. Duration: Patient has had the wound for < 2 weeks prior to presenting for treatment Timing: Pain in wound is constant (hurts all the time) Context: The wound occurred when the patient bumped her right lower extremity against the bathtub Modifying Factors: Other treatment(s) tried include:has been trying to apply local ointment at home Associated Signs and Symptoms: Patient reports having increase swelling. HPI Description: 82 year old patient who is known to the wound center  for several years now has a new injury to the right lower extremity where she bumped herself against the bath tub. He also has had a chronic right lateral malleolus open wound which has been coming on and off for several years now. This has been there for at least 2014 and was healed out in November 2016. In the past she has been seen by her cardiologist Dr. Kathlyn Sacramento in April 2016. Noninvasive vascular evaluation showed an ABI of 0.68 on the right and 0.85 on the left. 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 One-vessel runoff below the knee via the peroneal artery with short occlusion of the anterior tibial artery and reconstitution. No intervention performed. Most recently she was seen for a wound on her right lower extremity which was healed out by March 15 She is not a smoker and has had no diabetes mellitus. 07/29/2015 -- the appointment with her cardiologist Dr. Fletcher Anon is still pending for review of her arterial duplex studies. Electronic Signature(s) Signed: 07/29/2015 3:58:40 PM By: Christin Fudge MD, FACS Entered By: Christin Fudge on 07/29/2015 15:58:39 Dada, [name] ([id number]) -------------------------------------------------------------------------------- Physical Exam Details Patient Name: [name]. Date of Service: 07/29/2015 3:45 PM Medical Record Patient Account Number: [id number] Number: Afful, RN, BSN, Treating RN: 1933-03-15 (82 y.o. Velva Harman Date of Birth/Sex: Female) Other Clinician: Primary Care Physician: Emily Filbert Treating Christin Fudge Referring Physician: Emily Filbert Physician/Extender: Suella Grove in Treatment: 12 Constitutional . Pulse regular. Respirations normal and unlabored. Afebrile. . Eyes Nonicteric. Reactive to light. Ears, Nose, Mouth, and Throat Lips, teeth, and gums WNL.Marland Kitchen Moist mucosa without lesions. Neck supple and nontender. No palpable supraclavicular or  cervical adenopathy. Normal sized without goiter. Respiratory WNL. No retractions.. Cardiovascular Pedal Pulses WNL. No clubbing, cyanosis or edema. Lymphatic No adneopathy. No adenopathy. No adenopathy. Musculoskeletal Adexa without tenderness or enlargement.. Digits  and nails w/o clubbing, cyanosis, infection, petechiae, ischemia, or inflammatory conditions.. Integumentary (Hair, Skin) No suspicious lesions. No crepitus or fluctuance. No peri-wound warmth or erythema. No masses.Marland Kitchen Psychiatric Judgement and insight Intact.. No evidence of depression, anxiety, or agitation.. Notes the area on the right lateral malleolus continues to be a very shallow ulcer and there is minimal eschar which was easily removed. The right anterior calf and the area of the previous ulcerations looked much cleaner today and no sharp debridement was required. I was able to clean this out with moist saline gauze Electronic Signature(s) Signed: 07/29/2015 4:08:56 PM By: Christin Fudge MD, FACS Entered By: Christin Fudge on 07/29/2015 16:08:54 Nichter, [name] ([id number]) -------------------------------------------------------------------------------- Physician Orders Details Patient Name: [name]. Date of Service: 07/29/2015 3:45 PM Medical Record Patient Account Number: [id number] Number: Afful, RN, BSN, Treating RN: 10-Feb-1933 (82 y.o. Velva Harman Date of Birth/Sex: Female) Other Clinician: Primary Care Physician: Emily Filbert Treating Christin Fudge Referring Physician: Emily Filbert Physician/Extender: Suella Grove in Treatment: 12 Verbal / Phone Orders: Yes Clinician: Afful, RN, BSN, Rita Read Back and Verified: Yes Diagnosis Coding Wound Cleansing Wound #12 Right,Proximal,Anterior Lower Leg o Cleanse wound with mild soap and water o May Shower, gently pat wound dry prior to applying new dressing. o May shower with protection. Wound #13 Right,Distal,Anterior Lower Leg o Cleanse wound with mild  soap and water o May Shower, gently pat wound dry prior to applying new dressing. o May shower with protection. Wound #14 Right,Lateral Malleolus o Cleanse wound with mild soap and water o May Shower, gently pat wound dry prior to applying new dressing. o May shower with protection. Anesthetic Wound #12 Right,Proximal,Anterior Lower Leg o Topical Lidocaine 4% cream applied to wound bed prior to debridement Wound #13 Right,Distal,Anterior Lower Leg o Topical Lidocaine 4% cream applied to wound bed prior to debridement Wound #14 Right,Lateral Malleolus o Topical Lidocaine 4% cream applied to wound bed prior to debridement Skin Barriers/Peri-Wound Care Wound #12 Right,Proximal,Anterior Lower Leg o Barrier cream Wound #13 Right,Distal,Anterior Lower Leg o Barrier cream Wound #14 Right,Lateral Malleolus o Barrier cream FREE, REISSIG ([id number]) Primary Wound Dressing Wound #12 Right,Proximal,Anterior Lower Leg o Aquacel Ag Wound #13 Right,Distal,Anterior Lower Leg o Aquacel Ag Wound #14 Right,Lateral Malleolus o Aquacel Ag Secondary Dressing Wound #12 Right,Proximal,Anterior Lower Leg o Dry Gauze Wound #13 Right,Distal,Anterior Lower Leg o Dry Gauze Wound #14 Right,Lateral Malleolus o Dry Gauze Dressing Change Frequency Wound #12 Right,Proximal,Anterior Lower Leg o Change dressing every other day. Wound #13 Right,Distal,Anterior Lower Leg o Change dressing every other day. Wound #14 Right,Lateral Malleolus o Change dressing every other day. Follow-up Appointments Wound #12 Right,Proximal,Anterior Lower Leg o Return Appointment in 1 week. Wound #13 Right,Distal,Anterior Lower Leg o Return Appointment in 1 week. Wound #14 Right,Lateral Malleolus o Return Appointment in 1 week. Edema Control o 2 Layer Lite Compression System - Right Lower Extremity Electronic Signature(s) Signed: 07/29/2015 4:13:42 PM By: Christin Fudge MD,  FACS Signed: 07/29/2015 5:07:46 PM By: Regan Lemming BSN, RN Cannell, [name] ([id number]) Entered By: Regan Lemming on 07/29/2015 15:52:23 Fitchett, [name] ([id number]) -------------------------------------------------------------------------------- Problem List Details Patient Name: KASY, DOKE A. Date of Service: 07/29/2015 3:45 PM Medical Record Patient Account Number: [id number] Number: Afful, RN, BSN, Treating RN: October 30, 1932 (82 y.o. 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 S81.811A Laceration without foreign body, right lower leg, initial 07/23/2015 Yes encounter Inactive Problems Resolved Problems ICD-10 Code Description Active Date Resolved Date S81.801A Unspecified open wound, right lower leg, initial encounter 05/05/2015 05/05/2015 L03.115 Cellulitis of right lower limb 05/05/2015 05/05/2015 Electronic Signature(s) Signed: 07/29/2015 3:57:52 PM By: Christin Fudge MD, FACS Entered By: Christin Fudge on 07/29/2015 15:57:52 Clasby, [name] ([id number]) Imbert, [name] ([id number]) -------------------------------------------------------------------------------- Progress Note Details Patient Name: [name]. Date of Service: 07/29/2015 3:45 PM Medical Record Patient Account Number: [id number] Number: Afful, RN, BSN, Treating RN: 15-Aug-1932 (82 y.o. Velva Harman Date of Birth/Sex: Female) Other Clinician: Primary Care Physician: Emily Filbert Treating Hashem Goynes Referring Physician: Emily Filbert Physician/Extender: Suella Grove in Treatment:  12 Subjective Chief Complaint Information obtained from Patient has a new open wound to the right lower extremity where she hit her leg on the bath tub about a week and a half ago History of Present Illness (HPI) The following HPI elements were documented for the patient's wound: Location: right lower extremity and right ankle Quality: Patient reports experiencing a sharp pain to affected area(s). Severity: Patient states wound are getting worse. Duration: Patient has had the wound for < 2 weeks prior to presenting for treatment Timing: Pain in wound is constant (hurts all the time) Context: The wound occurred when the patient bumped her right lower extremity against the bathtub Modifying Factors: Other treatment(s) tried include:has been trying to apply local ointment at home Associated Signs and Symptoms: Patient reports having increase swelling. 82 year old patient who is known to the wound center for several years now has a new injury to the right lower extremity where she bumped herself against the bath tub. He also has had a chronic right lateral malleolus open wound which has been coming on and off for several years now. This has been there for at least 2014 and was healed out in November 2016. In the past she has been seen by her cardiologist Dr. Kathlyn Sacramento in April 2016. Noninvasive vascular evaluation showed an ABI of 0.68 on the right and 0.85 on the left. 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 One-vessel runoff below the knee via the peroneal artery with short occlusion of the anterior tibial artery and reconstitution. No intervention performed. Most recently she was seen for a wound on her right lower extremity which was healed out by March 15 She is not a smoker and has had no diabetes mellitus. 07/29/2015 -- the appointment with her cardiologist Dr. Fletcher Anon is still pending for review of her  arterial duplex studies. DAVIN, GRIFFIN ([id number]) Objective Constitutional Pulse regular. Respirations normal and unlabored. Afebrile. Vitals Time Taken: 3:28 PM, Height: 68 in, Weight: 150 lbs, BMI: 22.8, Temperature: 97.8 F, Pulse: 54 bpm, Respiratory Rate: 16 breaths/min, Blood Pressure: 122/52 mmHg. Eyes Nonicteric. Reactive to light. Ears, Nose, Mouth, and Throat Lips, teeth, and gums WNL.Marland Kitchen Moist mucosa without lesions. Neck supple and nontender. No palpable supraclavicular or cervical adenopathy. Normal sized without goiter. Respiratory WNL. No retractions.. Cardiovascular Pedal Pulses WNL. No clubbing, cyanosis or edema. Lymphatic No adneopathy. No adenopathy. No adenopathy. Musculoskeletal Adexa without tenderness or enlargement.. Digits and nails w/o clubbing, cyanosis, infection, petechiae, ischemia, or inflammatory conditions.Marland Kitchen Psychiatric Judgement and insight Intact.. No evidence of depression, anxiety, or agitation.. General  Notes: the area on the right lateral malleolus continues to be a very shallow ulcer and there is minimal eschar which was easily removed. The right anterior calf and the area of the previous ulcerations looked much cleaner today and no sharp debridement was required. I was able to clean this out with moist saline gauze Integumentary (Hair, Skin) No suspicious lesions. No crepitus or fluctuance. No peri-wound warmth or erythema. No masses.. Wound #12 status is Open. Original cause of wound was Gradually Appeared. The wound is located on the Right,Proximal,Anterior Lower Leg. The wound measures 1cm length x 1.1cm width x 0.1cm depth; 0.864cm^2 area and 0.086cm^3 volume. The wound is limited to skin breakdown. There is no tunneling or undermining noted. There is a medium amount of serosanguineous drainage noted. The wound margin is distinct with the outline attached to the wound base. There is no granulation within the wound bed. There is a large  (67-100%) amount of necrotic tissue within the wound bed including Eschar and Adherent Slough. ANNETTE, PLIMPTON ([id number]) The periwound skin appearance exhibited: Moist. The periwound skin appearance did not exhibit: Callus, Crepitus, Excoriation, Fluctuance, Friable, Induration, Localized Edema, Rash, Scarring, Dry/Scaly, Maceration, Atrophie Blanche, Cyanosis, Ecchymosis, Hemosiderin Staining, Mottled, Pallor, Rubor, Erythema. Periwound temperature was noted as No Abnormality. The periwound has tenderness on palpation. Wound #13 status is Open. Original cause of wound was Gradually Appeared. The wound is located on the Right,Distal,Anterior Lower Leg. The wound measures 2.8cm length x 2.3cm width x 0.1cm depth; 5.058cm^2 area and 0.506cm^3 volume. The wound is limited to skin breakdown. There is no tunneling or undermining noted. There is a medium amount of serosanguineous drainage noted. The wound margin is distinct with the outline attached to the wound base. There is no granulation within the wound bed. There is a large (67-100%) amount of necrotic tissue within the wound bed including Eschar. The periwound skin appearance exhibited: Localized Edema, Moist. The periwound skin appearance did not exhibit: Callus, Crepitus, Excoriation, Fluctuance, Friable, Induration, Rash, Scarring, Dry/Scaly, Maceration, Atrophie Blanche, Cyanosis, Ecchymosis, Hemosiderin Staining, Mottled, Pallor, Rubor, Erythema. Periwound temperature was noted as No Abnormality. The periwound has tenderness on palpation. Wound #14 status is Open. Original cause of wound was Gradually Appeared. The wound is located on the Right,Lateral Malleolus. The wound measures 0.5cm length x 0.5cm width x 0.1cm depth; 0.196cm^2 area and 0.02cm^3 volume. The wound is limited to skin breakdown. There is no tunneling or undermining noted. There is a medium amount of serosanguineous drainage noted. The wound margin is distinct with  the outline attached to the wound base. There is small (1-33%) pink, pale granulation within the wound bed. There is a large (67-100%) amount of necrotic tissue within the wound bed including Adherent Slough. The periwound skin appearance exhibited: Localized Edema, Moist. The periwound skin appearance did not exhibit: Callus, Crepitus, Excoriation, Fluctuance, Friable, Induration, Rash, Scarring, Dry/Scaly, Maceration, Atrophie Blanche, Cyanosis, Ecchymosis, Hemosiderin Staining, Mottled, Pallor, Rubor, Erythema. Periwound temperature was noted as No Abnormality. The periwound has tenderness on palpation. 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 May shower with protection. Wound #13 Right,Distal,Anterior  Lower Leg: Cleanse wound with mild soap and water May Shower, gently pat wound dry prior to applying new dressing. May shower with protection. Wound #14 Right,Lateral Malleolus: Cleanse wound with mild soap and water May Shower, gently pat wound dry prior to applying new dressing. May shower with protection. Anesthetic: Wound #12 Right,Proximal,Anterior Lower Leg: Topical Lidocaine 4% cream applied to wound bed prior to debridement Wound #13 Right,Distal,Anterior Lower Leg: Topical Lidocaine 4% cream applied to wound bed prior to debridement Wound #14 Right,Lateral Malleolus: Topical Lidocaine 4% cream applied to wound bed prior to debridement Skin Barriers/Peri-Wound Care: Wound #12 Right,Proximal,Anterior Lower  Leg: Barrier cream Wound #13 Right,Distal,Anterior Lower Leg: Barrier cream Wound #14 Right,Lateral Malleolus: Barrier cream Primary Wound Dressing: Wound #12 Right,Proximal,Anterior Lower Leg: Aquacel Ag Wound #13 Right,Distal,Anterior Lower Leg: Aquacel Ag Wound #14 Right,Lateral Malleolus: Aquacel Ag Secondary Dressing: Wound #12 Right,Proximal,Anterior Lower Leg: Dry Gauze Wound #13 Right,Distal,Anterior Lower Leg: Dry Gauze Wound #14 Right,Lateral Malleolus: Dry Gauze Dressing Change Frequency: Wound #12 Right,Proximal,Anterior Lower Leg: Change dressing every other day. Wound #13 Right,Distal,Anterior Lower Leg: Change dressing every other day. Wound #14 Right,Lateral Malleolus: Change dressing every other day. Follow-up Appointments: Wound #12 Right,Proximal,Anterior Lower Leg: Return Appointment in 1 week. Wound #13 Right,Distal,Anterior Lower Leg: KITANNA, URNESS ([id number]) Return Appointment in 1 week. Wound #14 Right,Lateral Malleolus: Return Appointment in 1 week. Edema Control: 2 Layer Lite Compression System - Right Lower Extremity I have recommended: 1. Silver alginate to be applied to the right anterior calf, shin and the right lateral malleolus. 2. since her ABIs could not be measured I have recommended we do a light Kerlix and Coban and an average recommended elevation as much as possible 3. Review by her cardiologist Dr Fletcher Anon, who may want to repeat a arterial duplex study to see the arterial flow to her right lower extremity as the last angiogram was done about a year ago. 4. We'll see her back next week for review Electronic Signature(s) Signed: 07/29/2015 4:09:50 PM By: Christin Fudge MD, FACS Entered By: Christin Fudge on 07/29/2015 16:09:50 Alper, [name] ([id number]) -------------------------------------------------------------------------------- SuperBill Details Patient Name: [name]. Date of Service: 07/29/2015 Medical Record Patient Account  Number: [id number] Number: Afful, RN, BSN, Treating RN: 10/25/1932 (82 y.o. 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

## 2015-07-30 NOTE — Progress Notes (Signed)
Latoya, Bailey ([id number]) Visit Report for 07/29/2015 Arrival Information Details Patient Name: Latoya, Bailey. Date of Service: 07/29/2015 3:45 PM Medical Record Number: [id number] Patient Account Number: [id number] Date of Birth/Sex: 1932-08-15 (82 y.o. Female) Treating RN: Afful, RN, BSN, Velva Harman Primary Care Physician: Emily Filbert Other Clinician: Referring Physician: Emily Filbert Treating Physician/Extender: Frann Rider in Treatment: 12 Visit Information History Since Last Visit Added or deleted any medications: No Patient Arrived: Ambulatory Any new allergies or adverse reactions: No Arrival Time: 15:28 Had a fall or experienced change in No Accompanied By: self activities of daily living that may affect Transfer Assistance: None risk of falls: Patient Identification Verified: Yes Signs or symptoms of abuse/neglect since last No Secondary Verification Process Yes visito Completed: Hospitalized since last visit: No Patient Requires Transmission-Based No Has Dressing in Place as Prescribed: Yes Precautions: Pain Present Now: No Patient Has Alerts: Yes Patient Alerts: ASA Electronic Signature(s) Signed: 07/29/2015 5:07:46 PM By: Regan Lemming BSN, RN Entered By: Regan Lemming on 07/29/2015 15:28:48 [name] ([id number]) -------------------------------------------------------------------------------- Encounter Discharge Information Details Patient Name: [name]. Date of Service: 07/29/2015 3:45 PM Medical Record Number: [id number] Patient Account Number: [id number] Date of Birth/Sex: 1932/08/29 (82 y.o. Female) Treating RN: Baruch Gouty, RN, BSN, Velva Harman Primary Care Physician: Emily Filbert Other Clinician: Referring Physician: Emily Filbert Treating Physician/Extender: Frann Rider in Treatment: 12 Encounter Discharge Information Items Discharge Pain Level: 0 Discharge Condition: Stable Ambulatory Status: Ambulatory Discharge Destination: Home Transportation: Private  Auto Accompanied By: sister Schedule Follow-up Appointment: No Medication Reconciliation completed and provided to Patient/Care No Trevon Strothers: Provided on Clinical Summary of Care: 07/29/2015 Form Type Recipient Paper Patient JW Electronic Signature(s) Signed: 07/29/2015 4:54:35 PM By: Regan Lemming BSN, RN Previous Signature: 07/29/2015 4:05:30 PM Version By: Ruthine Dose Entered By: Regan Lemming on 07/29/2015 16:54:35 [name], [name] ([id number]) -------------------------------------------------------------------------------- Lower Extremity Assessment Details Patient Name: [name]. Date of Service: 07/29/2015 3:45 PM Medical Record Number: [id number] Patient Account Number: [id number] Date of Birth/Sex: 02/19/1933 (82 y.o. Female) Treating RN: Afful, RN, BSN, Velva Harman Primary Care Physician: Emily Filbert Other Clinician: Referring Physician: Emily Filbert Treating Physician/Extender: Frann Rider in Treatment: 12 Edema Assessment Assessed: Shirlyn Goltz: No] Patrice Paradise: No] E[Left: dema] [Right: :] Calf Left: Right: Point of Measurement: 34 cm From Medial Instep cm 32.1 cm Ankle Left: Right: Point of Measurement: 10 cm From Medial Instep cm 22.2 cm Vascular Assessment Claudication: Claudication Assessment [Right:None] Pulses: Posterior Tibial Dorsalis Pedis Palpable: [Right:Yes] Extremity colors, hair growth, and conditions: Extremity Color: [Right:Mottled] Hair Growth on Extremity: [Right:No] Temperature of Extremity: [Right:Warm] Capillary Refill: [Right:< 3 seconds] Toe Nail Assessment Left: Right: Thick: No Discolored: No Deformed: No Improper Length and Hygiene: No Electronic Signature(s) Signed: 07/29/2015 5:07:46 PM By: Regan Lemming BSN, RN Entered By: Regan Lemming on 07/29/2015 15:33:17 [name], [name] ([id number]) [name], [name] ([id number]) -------------------------------------------------------------------------------- Multi Wound Chart Details Patient Name: [name]. Date of Service: 07/29/2015 3:45 PM Medical Record Number: [id number] Patient Account Number: [id number] Date of Birth/Sex: Aug 27, 1932 (82 y.o. Female) Treating RN: Baruch Gouty, RN, BSN, Velva Harman Primary Care Physician: Emily Filbert Other Clinician: Referring Physician: Emily Filbert Treating Physician/Extender: Frann Rider in Treatment: 12 Vital Signs Height(in): 68 Pulse(bpm): 54 Weight(lbs): 150 Blood Pressure 122/52 (mmHg): Body Mass Index(BMI): 23 Temperature(F): 97.8 Respiratory Rate 16 (breaths/min): Photos: [12:No Photos] [13:No Photos] [14:No Photos] Wound Location: [12:Right Lower Leg - Anterior, Proximal] [13:Right Lower Leg - Anterior, Distal] [14:Right Malleolus - Lateral] Wounding Event: [12:Gradually Appeared] [13:Gradually Appeared] [14:Gradually  Appeared] Primary Etiology: [12:Skin Tear] [13:Skin Tear] [14:Atypical] Comorbid History: [12:Cataracts, Anemia, Arrhythmia, Hypertension, Arrhythmia, Hypertension, Arrhythmia, Hypertension, Osteoarthritis, Received Osteoarthritis, Received Osteoarthritis, Received Radiation] [13:Cataracts, Anemia, Radiation] [14:Cataracts,  Anemia, Radiation] Date Acquired: [12:07/11/2015] [13:07/11/2015] [14:07/11/2015] Weeks of Treatment: [12:0] [13:0] [14:0] Wound Status: [12:Open] [13:Open] [14:Open] Measurements L x W x D 1x1.1x0.1 [13:2.8x2.3x0.1] [14:0.5x0.5x0.1] (cm) Area (cm) : [12:0.864] [13:5.058] [14:0.196] Volume (cm) : [12:0.086] [13:0.506] [14:0.02] % Reduction in Area: [12:51.10%] [13:-28.80%] [14:0.00%] % Reduction in Volume: 51.40% [13:-28.80%] [14:0.00%] Classification: [12:Unclassifiable] [13:Unclassifiable] [14:Partial Thickness] Exudate Amount: [12:Medium] [13:Medium] [14:Medium] Exudate Type: [12:Serosanguineous] [13:Serosanguineous] [14:Serosanguineous] Exudate Color: [12:red, brown] [13:red, brown] [14:red, brown] Wound Margin: [12:Distinct, outline attached Distinct, outline attached Distinct, outline  attached] Granulation Amount: [12:None Present (0%)] [13:None Present (0%)] [14:Small (1-33%)] Granulation Quality: [12:N/A] [13:N/A] [14:Pink, Pale] Necrotic Amount: [12:Large (67-100%)] [13:Large (67-100%)] [14:Large (67-100%)] Necrotic Tissue: [12:Eschar, Adherent Slough Eschar] [14:Adherent Slough] Exposed Structures: [12:Fascia: No Fat: No Tendon: No] [13:Fascia: No Fat: No Tendon: No] [14:Fascia: No Fat: No Tendon: No] Muscle: No Muscle: No Muscle: No Joint: No Joint: No Joint: No Bone: No Bone: No Bone: No Limited to Skin Limited to Skin Limited to Skin Breakdown Breakdown Breakdown Epithelialization: None None None Periwound Skin Texture: Edema: No Edema: Yes Edema: Yes Excoriation: No Excoriation: No Excoriation: No Induration: No Induration: No Induration: No Callus: No Callus: No Callus: No Crepitus: No Crepitus: No Crepitus: No Fluctuance: No Fluctuance: No Fluctuance: No Friable: No Friable: No Friable: No Rash: No Rash: No Rash: No Scarring: No Scarring: No Scarring: No Periwound Skin Moist: Yes Moist: Yes Moist: Yes Moisture: Maceration: No Maceration: No Maceration: No Dry/Scaly: No Dry/Scaly: No Dry/Scaly: No Periwound Skin Color: Atrophie Blanche: No Atrophie Blanche: No Atrophie Blanche: No Cyanosis: No Cyanosis: No Cyanosis: No Ecchymosis: No Ecchymosis: No Ecchymosis: No Erythema: No Erythema: No Erythema: No Hemosiderin Staining: No Hemosiderin Staining: No Hemosiderin Staining: No Mottled: No Mottled: No Mottled: No Pallor: No Pallor: No Pallor: No Rubor: No Rubor: No Rubor: No Temperature: No Abnormality No Abnormality No Abnormality Tenderness on Yes Yes Yes Palpation: Wound Preparation: Ulcer Cleansing: Ulcer Cleansing: Other: Ulcer Cleansing: Other: Rinsed/Irrigated with water and soap water and soap Saline Topical Anesthetic Topical Anesthetic Topical Anesthetic Applied: Other: lidocaine Applied: Other:  lidocaine Applied: Other: lidocaine 4% 4% 4% Treatment Notes Electronic Signature(s) Signed: 07/29/2015 5:07:46 PM By: Regan Lemming BSN, RN Entered By: Regan Lemming on 07/29/2015 15:51:55 [name], [name] ([id number]) -------------------------------------------------------------------------------- [place] Details Patient Name: [name]. Date of Service: 07/29/2015 3:45 PM Medical Record Number: [id number] Patient Account Number: [id number] Date of Birth/Sex: 1933-02-24 (82 y.o. Female) Treating RN: Afful, RN, BSN, Velva Harman Primary Care Physician: Emily Filbert Other Clinician: Referring Physician: Emily Filbert Treating Physician/Extender: Frann Rider in Treatment: 12 Active Inactive Electronic Signature(s) Signed: 07/29/2015 5:07:46 PM By: Regan Lemming BSN, RN Entered By: Regan Lemming on 07/29/2015 15:51:47 [name], [name] ([id number]) -------------------------------------------------------------------------------- Pain Assessment Details Patient Name: [name]. Date of Service: 07/29/2015 3:45 PM Medical Record Number: [id number] Patient Account Number: [id number] Date of Birth/Sex: 07/14/1932 (82 y.o. Female) Treating RN: Baruch Gouty, RN, BSN, Velva Harman Primary Care Physician: Emily Filbert Other Clinician: Referring Physician: Emily Filbert Treating Physician/Extender: Frann Rider in Treatment: 12 Active Problems Location of Pain Severity and Description of Pain Patient Has Paino No Site Locations Pain Management and Medication Current Pain Management: Electronic Signature(s) Signed: 07/29/2015 5:07:46 PM By: Regan Lemming BSN, RN Entered By: Regan Lemming on 07/29/2015 15:28:55 [name], [name] ([id number]) -------------------------------------------------------------------------------- Patient/Caregiver Education Details Patient  Name: Latoya, Bailey. Date of Service: 07/29/2015 3:45 PM Medical Record Number: [id number] Patient Account Number: [id number] Date of Birth/Gender:  January 08, 1933 (82 y.o. Female) Treating RN: Baruch Gouty, RN, BSN, Velva Harman Primary Care Physician: Emily Filbert Other Clinician: Referring Physician: Emily Filbert Treating Physician/Extender: Frann Rider in Treatment: 12 Education Assessment Education Provided To: Patient Education Topics Provided Basic Hygiene: Methods: Explain/Verbal Responses: State content correctly Wound/Skin Impairment: Methods: Explain/Verbal Responses: State content correctly Electronic Signature(s) Signed: 07/29/2015 4:54:56 PM By: Regan Lemming BSN, RN Entered By: Regan Lemming on 07/29/2015 16:54:55 [name], [name] ([id number]) -------------------------------------------------------------------------------- Wound Assessment Details Patient Name: [name]. Date of Service: 07/29/2015 3:45 PM Medical Record Number: [id number] Patient Account Number: [id number] Date of Birth/Sex: Feb 06, 1933 (82 y.o. Female) Treating RN: Afful, RN, BSN, [place] Primary Care Physician: Emily Filbert Other Clinician: Referring Physician: Emily Filbert Treating Physician/Extender: Frann Rider in Treatment: 12 Wound Status Wound Number: 12 Primary Skin Tear Etiology: Wound Location: Right Lower Leg - Anterior, Proximal Wound Open Status: Wounding Event: Gradually Appeared Comorbid Cataracts, Anemia, Arrhythmia, Date Acquired: 07/11/2015 History: Hypertension, Osteoarthritis, Received Weeks Of Treatment: 0 Radiation Clustered Wound: No Photos Photo Uploaded By: Regan Lemming on 07/29/2015 16:47:08 Wound Measurements Length: (cm) 1 Width: (cm) 1.1 Depth: (cm) 0.1 Area: (cm) 0.864 Volume: (cm) 0.086 % Reduction in Area: 51.1% % Reduction in Volume: 51.4% Epithelialization: None Tunneling: No Undermining: No Wound Description Classification: Unclassifiable Wound Margin: Distinct, outline attached Exudate Amount: Medium Exudate Type: Serosanguineous Exudate Color: red, brown Foul Odor After Cleansing: No Wound  Bed Granulation Amount: None Present (0%) Exposed Structure Necrotic Amount: Large (67-100%) Fascia Exposed: No Necrotic Quality: Eschar, Adherent Slough Fat Layer Exposed: No [name], Latoya A. ([id number]) Tendon Exposed: No Muscle Exposed: No Joint Exposed: No Bone Exposed: No Limited to Skin Breakdown Periwound Skin Texture Texture Color No Abnormalities Noted: No No Abnormalities Noted: No Callus: No Atrophie Blanche: No Crepitus: No Cyanosis: No Excoriation: No Ecchymosis: No Fluctuance: No Erythema: No Friable: No Hemosiderin Staining: No Induration: No Mottled: No Localized Edema: No Pallor: No Rash: No Rubor: No Scarring: No Temperature / Pain Moisture Temperature: No Abnormality No Abnormalities Noted: No Tenderness on Palpation: Yes Dry / Scaly: No Maceration: No Moist: Yes Wound Preparation Ulcer Cleansing: Rinsed/Irrigated with Saline Topical Anesthetic Applied: Other: lidocaine 4%, Treatment Notes Wound #12 (Right, Proximal, Anterior Lower Leg) 1. Cleansed with: Clean wound with Normal Saline 3. Peri-wound Care: Barrier cream 4. Dressing Applied: Aquacel Ag 5. Secondary Dressing Applied Dry Gauze 7. Secured with 2 Layer Lite Compression System - Right Lower Extremity Electronic Signature(s) Signed: 07/29/2015 5:07:46 PM By: Regan Lemming BSN, RN Entered By: Regan Lemming on 07/29/2015 15:49:46 [place], [name] ([id number]) -------------------------------------------------------------------------------- Wound Assessment Details Patient Name: [name]. Date of Service: 07/29/2015 3:45 PM Medical Record Number: [id number] Patient Account Number: [id number] Date of Birth/Sex: May 23, 1932 (82 y.o. Female) Treating RN: Afful, RN, BSN, [place] Primary Care Physician: Emily Filbert Other Clinician: Referring Physician: Emily Filbert Treating Physician/Extender: Frann Rider in Treatment: 12 Wound Status Wound Number: 13 Primary Skin Tear Etiology: Wound  Location: Right Lower Leg - Anterior, Distal Wound Open Status: Wounding Event: Gradually Appeared Comorbid Cataracts, Anemia, Arrhythmia, Date Acquired: 07/11/2015 History: Hypertension, Osteoarthritis, Received Weeks Of Treatment: 0 Radiation Clustered Wound: No Photos Photo Uploaded By: Regan Lemming on 07/29/2015 16:47:24 Wound Measurements Length: (cm) 2.8 Width: (cm) 2.3 Depth: (cm) 0.1 Area: (cm) 5.058 Volume: (cm) 0.506 % Reduction in Area: -28.8% % Reduction in Volume: -28.8% Epithelialization: None Tunneling: No  Undermining: No Wound Description Classification: Unclassifiable Wound Margin: Distinct, outline attached Exudate Amount: Medium Exudate Type: Serosanguineous Exudate Color: red, brown Foul Odor After Cleansing: No Wound Bed Granulation Amount: None Present (0%) Exposed Structure Necrotic Amount: Large (67-100%) Fascia Exposed: No Necrotic Quality: Eschar Fat Layer Exposed: No Narasimhan, Raelynne A. ([id number]) Tendon Exposed: No Muscle Exposed: No Joint Exposed: No Bone Exposed: No Limited to Skin Breakdown Periwound Skin Texture Texture Color No Abnormalities Noted: No No Abnormalities Noted: No Callus: No Atrophie Blanche: No Crepitus: No Cyanosis: No Excoriation: No Ecchymosis: No Fluctuance: No Erythema: No Friable: No Hemosiderin Staining: No Induration: No Mottled: No Localized Edema: Yes Pallor: No Rash: No Rubor: No Scarring: No Temperature / Pain Moisture Temperature: No Abnormality No Abnormalities Noted: No Tenderness on Palpation: Yes Dry / Scaly: No Maceration: No Moist: Yes Wound Preparation Ulcer Cleansing: Other: water and soap, Topical Anesthetic Applied: Other: lidocaine 4%, Treatment Notes Wound #13 (Right, Distal, Anterior Lower Leg) 1. Cleansed with: Clean wound with Normal Saline 3. Peri-wound Care: Barrier cream 4. Dressing Applied: Aquacel Ag 5. Secondary Dressing Applied Dry Gauze 7. Secured with 2  Layer Lite Compression System - Right Lower Extremity Electronic Signature(s) Signed: 07/29/2015 5:07:46 PM By: Regan Lemming BSN, RN Entered By: Regan Lemming on 07/29/2015 15:50:59 [place], [name] ([id number]) -------------------------------------------------------------------------------- Wound Assessment Details Patient Name: [name]. Date of Service: 07/29/2015 3:45 PM Medical Record Number: [id number] Patient Account Number: [id number] Date of Birth/Sex: 1932/07/17 (82 y.o. Female) Treating RN: Afful, RN, BSN, [place] Primary Care Physician: Emily Filbert Other Clinician: Referring Physician: Emily Filbert Treating Physician/Extender: Frann Rider in Treatment: 12 Wound Status Wound Number: 14 Primary Atypical Etiology: Wound Location: Right Malleolus - Lateral Wound Open Wounding Event: Gradually Appeared Status: Date Acquired: 07/11/2015 Comorbid Cataracts, Anemia, Arrhythmia, Weeks Of Treatment: 0 History: Hypertension, Osteoarthritis, Received Clustered Wound: No Radiation Photos Photo Uploaded By: Regan Lemming on 07/29/2015 16:47:51 Wound Measurements Length: (cm) 0.5 Width: (cm) 0.5 Depth: (cm) 0.1 Area: (cm) 0.196 Volume: (cm) 0.02 % Reduction in Area: 0% % Reduction in Volume: 0% Epithelialization: None Tunneling: No Undermining: No Wound Description Classification: Partial Thickness Wound Margin: Distinct, outline attached Exudate Amount: Medium Exudate Type: Serosanguineous Exudate Color: red, brown Foul Odor After Cleansing: No Wound Bed Granulation Amount: Small (1-33%) Exposed Structure Granulation Quality: Pink, Pale Fascia Exposed: No Necrotic Amount: Large (67-100%) Fat Layer Exposed: No Necrotic Quality: Adherent Slough Tendon Exposed: No [place], Tagan A. ([id number]) Muscle Exposed: No Joint Exposed: No Bone Exposed: No Limited to Skin Breakdown Periwound Skin Texture Texture Color No Abnormalities Noted: No No Abnormalities Noted:  No Callus: No Atrophie Blanche: No Crepitus: No Cyanosis: No Excoriation: No Ecchymosis: No Fluctuance: No Erythema: No Friable: No Hemosiderin Staining: No Induration: No Mottled: No Localized Edema: Yes Pallor: No Rash: No Rubor: No Scarring: No Temperature / Pain Moisture Temperature: No Abnormality No Abnormalities Noted: No Tenderness on Palpation: Yes Dry / Scaly: No Maceration: No Moist: Yes Wound Preparation Ulcer Cleansing: Other: water and soap, Topical Anesthetic Applied: Other: lidocaine 4%, Treatment Notes Wound #14 (Right, Lateral Malleolus) 1. Cleansed with: Clean wound with Normal Saline 3. Peri-wound Care: Barrier cream 4. Dressing Applied: Aquacel Ag 5. Secondary Dressing Applied Dry Gauze 7. Secured with 2 Layer Lite Compression System - Right Lower Extremity Electronic Signature(s) Signed: 07/29/2015 5:07:46 PM By: Regan Lemming BSN, RN Entered By: Regan Lemming on 07/29/2015 15:51:35 [place], [name] ([id number]) -------------------------------------------------------------------------------- Vitals Details Patient Name: [name]. Date of Service: 07/29/2015 3:45 PM  Medical Record Number: [id number] Patient Account Number: [id number] Date of Birth/Sex: 10-06-1932 (82 y.o. Female) Treating RN: Afful, RN, BSN, [place] Primary Care Physician: Emily Filbert Other Clinician: Referring Physician: Emily Filbert Treating Physician/Extender: Frann Rider in Treatment: 12 Vital Signs Time Taken: 15:28 Temperature (F): 97.8 Height (in): 68 Pulse (bpm): 54 Weight (lbs): 150 Respiratory Rate (breaths/min): 16 Body Mass Index (BMI): 22.8 Blood Pressure (mmHg): 122/52 Reference Range: 80 - 120 mg / dl Electronic Signature(s) Signed: 07/29/2015 5:07:46 PM By: Regan Lemming BSN, RN Entered By: Regan Lemming on 07/29/2015 15:32:40

## 2015-08-04 DIAGNOSIS — J40 Bronchitis, not specified as acute or chronic: Secondary | ICD-10-CM | POA: Diagnosis not present

## 2015-08-04 DIAGNOSIS — J4522 Mild intermittent asthma with status asthmaticus: Secondary | ICD-10-CM | POA: Diagnosis not present

## 2015-08-05 ENCOUNTER — Ambulatory Visit: Payer: PPO | Admitting: Surgery

## 2015-08-12 ENCOUNTER — Encounter: Payer: PPO | Admitting: Surgery

## 2015-08-12 DIAGNOSIS — I70232 Atherosclerosis of native arteries of right leg with ulceration of calf: Secondary | ICD-10-CM | POA: Diagnosis not present

## 2015-08-12 DIAGNOSIS — S81811A Laceration without foreign body, right lower leg, initial encounter: Secondary | ICD-10-CM | POA: Diagnosis not present

## 2015-08-12 DIAGNOSIS — I482 Chronic atrial fibrillation: Secondary | ICD-10-CM | POA: Diagnosis not present

## 2015-08-13 NOTE — Progress Notes (Addendum)
[name], [name] Visit Report for 08/12/2015 Arrival Information Details Patient Name: [name], [name]. Date of Service: 08/12/2015 1:30 PM Medical Record Number: [id number] Patient Account Number: [id number] Date of Birth/Sex: 14-Mar-1933 (82 y.o. Female) Treating RN: Afful, RN, BSN, Velva Harman Primary Care Physician: Emily Filbert Other Clinician: Referring Physician: Emily Filbert Treating Physician/Extender: Frann Rider in Treatment: 49 Visit Information History Since Last Visit Added or deleted any medications: No Patient Arrived: Ambulatory Any new allergies or adverse reactions: No Arrival Time: 13:14 Had a fall or experienced change in No Accompanied By: sister activities of daily living that may affect Transfer Assistance: None risk of falls: Patient Identification Verified: Yes Signs or symptoms of abuse/neglect since last No Secondary Verification Process Yes visito Completed: Hospitalized since last visit: No Patient Requires Transmission-Based No Has Dressing in Place as Prescribed: Yes Precautions: Pain Present Now: No Patient Has Alerts: Yes Patient Alerts: ASA Electronic Signature(s) Signed: 08/12/2015 6:25:48 PM By: Regan Lemming BSN, RN Entered By: Regan Lemming on 08/12/2015 13:15:43 Matthis, [name] ([id number]) -------------------------------------------------------------------------------- Encounter Discharge Information Details Patient Name: [name]. Date of Service: 08/12/2015 1:30 PM Medical Record Number: [id number] Patient Account Number: [id number] Date of Birth/Sex: 17-Jul-1932 (82 y.o. Female) Treating RN: Baruch Gouty, RN, BSN, Velva Harman Primary Care Physician: Emily Filbert Other Clinician: Referring Physician: Emily Filbert Treating Physician/Extender: Frann Rider in Treatment: 14 Encounter Discharge Information Items Discharge Pain Level: 0 Discharge Condition: Stable Ambulatory Status: Ambulatory Discharge Destination: Home Transportation:  Private Auto Accompanied By: sister Schedule Follow-up Appointment: No Medication Reconciliation completed and provided to Patient/Care No [name]: Provided on Clinical Summary of Care: 08/12/2015 Form Type Recipient Paper Patient JW Electronic Signature(s) Signed: 08/12/2015 1:42:39 PM By: Ruthine Dose Entered By: Ruthine Dose on 08/12/2015 13:42:39 [name], [name] ([id number]) -------------------------------------------------------------------------------- Lower Extremity Assessment Details Patient Name: [name]. Date of Service: 08/12/2015 1:30 PM Medical Record Number: [id number] Patient Account Number: [id number] Date of Birth/Sex: [birth year] (82 y.o. Female) Treating RN: Afful, RN, BSN, Velva Harman Primary Care Physician: Emily Filbert Other Clinician: Referring Physician: Emily Filbert Treating Physician/Extender: Frann Rider in Treatment: 14 Edema Assessment Assessed: Shirlyn Goltz: No] Patrice Paradise: No] Edema: [Left: N] [Right: o] Calf Left: Right: Point of Measurement: 34 cm From Medial Instep cm 32 cm Ankle Left: Right: Point of Measurement: 10 cm From Medial Instep cm 22 cm Vascular Assessment Claudication: Claudication Assessment [Right:None] Pulses: Posterior Tibial Dorsalis Pedis Palpable: [Right:Yes] Extremity colors, hair growth, and conditions: Extremity Color: [Right:Mottled] Hair Growth on Extremity: [Right:Yes] Temperature of Extremity: [Right:Warm] Capillary Refill: [Right:< 3 seconds] Toe Nail Assessment Left: Right: Thick: No Discolored: No Deformed: No Improper Length and Hygiene: No Electronic Signature(s) Signed: 08/12/2015 6:25:48 PM By: Regan Lemming BSN, RN Entered By: Regan Lemming on 08/12/2015 13:24:12 [name], [name] ([id number]) [name], [name] ([id number]) -------------------------------------------------------------------------------- Multi Wound Chart Details Patient Name: [name]. Date of Service: 08/12/2015 1:30 PM Medical Record Number:  [id number] Patient Account Number: [id number] Date of Birth/Sex: [birth year] (82 y.o. Female) Treating RN: Baruch Gouty, RN, BSN, Velva Harman Primary Care Physician: Emily Filbert Other Clinician: Referring Physician: Emily Filbert Treating Physician/Extender: Frann Rider in Treatment: 14 Vital Signs Height(in): 68 Pulse(bpm): 53 Weight(lbs): 150 Blood Pressure 118/54 (mmHg): Body Mass Index(BMI): 23 Temperature(F): 98.0 Respiratory Rate 16 (breaths/min): Photos: [12:No Photos] [13:No Photos] [14:No Photos] Wound Location: [12:Right Lower Leg - Anterior, Proximal] [13:Right Lower Leg - Anterior, Distal] [14:Right Malleolus - Lateral] Wounding Event: [12:Gradually Appeared] [13:Gradually Appeared] [14:Gradually Appeared] Primary Etiology: [12:Skin Tear] [13:Skin Tear] [14:Atypical] Comorbid History: [  12:Cataracts, Anemia, Arrhythmia, Hypertension, Arrhythmia, Hypertension, Arrhythmia, Hypertension, Osteoarthritis, Received Osteoarthritis, Received Osteoarthritis, Received Radiation] [13:Cataracts, Anemia, Radiation] [14:Cataracts,  Anemia, Radiation] Date Acquired: [12:07/11/2015] [13:07/11/2015] [14:07/11/2015] Weeks of Treatment: [12:2] [13:2] [14:2] Wound Status: [12:Open] [13:Open] [14:Open] Measurements L x W x D 0.5x0.3x0.1 [13:0.5x0.5x0.1] [14:0.1x0.1x0.1] (cm) Area (cm) : [12:0.118] [13:0.196] [14:0.008] Volume (cm) : [12:0.012] [13:0.02] [14:0.001] % Reduction in Area: [12:93.30%] [13:95.00%] [14:95.90%] % Reduction in Volume: 93.20% [13:94.90%] [14:95.00%] Classification: [12:Unclassifiable] [13:Partial Thickness] [14:Partial Thickness] Exudate Amount: [12:Medium] [13:Medium] [14:None Present] Exudate Type: [12:Serosanguineous] [13:Serosanguineous] [14:N/A] Exudate Color: [12:red, brown] [13:red, brown] [14:N/A] Wound Margin: [12:Distinct, outline attached Distinct, outline attached Distinct, outline attached] Granulation Amount: [12:None Present (0%)] [13:Small (1-33%)]  [14:None Present (0%)] Granulation Quality: [12:N/A] [13:Pink, Pale] [14:N/A] Necrotic Amount: [12:Large (67-100%)] [13:Large (67-100%)] [14:Large (67-100%)] Necrotic Tissue: [12:Eschar, Adherent Slough Eschar] [14:Adherent Slough] Exposed Structures: [12:Fascia: No Fat: No Tendon: No] [13:Fascia: No Fat: No Tendon: No] [14:Fascia: No Fat: No Tendon: No] Muscle: No Muscle: No Muscle: No Joint: No Joint: No Joint: No Bone: No Bone: No Bone: No Limited to Skin Limited to Skin Limited to Skin Breakdown Breakdown Breakdown Epithelialization: None None None Periwound Skin Texture: Edema: No Edema: Yes Edema: Yes Excoriation: No Excoriation: No Excoriation: No Induration: No Induration: No Induration: No Callus: No Callus: No Callus: No Crepitus: No Crepitus: No Crepitus: No Fluctuance: No Fluctuance: No Fluctuance: No Friable: No Friable: No Friable: No Rash: No Rash: No Rash: No Scarring: No Scarring: No Scarring: No Periwound Skin Moist: Yes Moist: Yes Dry/Scaly: Yes Moisture: Maceration: No Maceration: No Maceration: No Dry/Scaly: No Dry/Scaly: No Moist: No Periwound Skin Color: Atrophie Blanche: No Atrophie Blanche: No Atrophie Blanche: No Cyanosis: No Cyanosis: No Cyanosis: No Ecchymosis: No Ecchymosis: No Ecchymosis: No Erythema: No Erythema: No Erythema: No Hemosiderin Staining: No Hemosiderin Staining: No Hemosiderin Staining: No Mottled: No Mottled: No Mottled: No Pallor: No Pallor: No Pallor: No Rubor: No Rubor: No Rubor: No Temperature: No Abnormality No Abnormality No Abnormality Tenderness on Yes Yes Yes Palpation: Wound Preparation: Ulcer Cleansing: Ulcer Cleansing: Other: Ulcer Cleansing: Other: Rinsed/Irrigated with water and soap water and soap Saline Topical Anesthetic Topical Anesthetic Topical Anesthetic Applied: Other: lidocaine Applied: Other: lidocaine Applied: Other: lidocaine 4% 4% 4% Treatment  Notes Electronic Signature(s) Signed: 08/12/2015 6:25:48 PM By: Regan Lemming BSN, RN Entered By: Regan Lemming on 08/12/2015 13:25:56 Lutz, [name] ([id number]) -------------------------------------------------------------------------------- [place] Details Patient Name: [name]. Date of Service: 08/12/2015 1:30 PM Medical Record Number: [id number] Patient Account Number: [id number] Date of Birth/Sex: [birth year] (82 y.o. Female) Treating RN: Afful, RN, BSN, Velva Harman Primary Care Physician: Emily Filbert Other Clinician: Referring Physician: Emily Filbert Treating Physician/Extender: Frann Rider in Treatment: 14 Active Inactive Electronic Signature(s) Signed: 08/12/2015 6:25:48 PM By: Regan Lemming BSN, RN Entered By: Regan Lemming on 08/12/2015 13:25:45 [name], [name] ([id number]) -------------------------------------------------------------------------------- Pain Assessment Details Patient Name: [name]. Date of Service: 08/12/2015 1:30 PM Medical Record Number: [id number] Patient Account Number: [id number] Date of Birth/Sex: 02-Apr-1933 (82 y.o. Female) Treating RN: Baruch Gouty, RN, BSN, Velva Harman Primary Care Physician: Emily Filbert Other Clinician: Referring Physician: Emily Filbert Treating Physician/Extender: Frann Rider in Treatment: 14 Active Problems Location of Pain Severity and Description of Pain Patient Has Paino No Site Locations Pain Management and Medication Current Pain Management: Electronic Signature(s) Signed: 08/12/2015 6:25:48 PM By: Regan Lemming BSN, RN Entered By: Regan Lemming on 08/12/2015 13:16:02 [name], [name] ([id number]) -------------------------------------------------------------------------------- Patient/Caregiver Education Details Patient Name: [name] Date of Service: 08/12/2015 1:30  PM Medical Record Number: [id number] Patient Account Number: [id number] Date of Birth/Gender: 10-05-1932 (82 y.o. Female) Treating RN: Baruch Gouty, RN, BSN,  Velva Harman Primary Care Physician: Emily Filbert Other Clinician: Referring Physician: Emily Filbert Treating Physician/Extender: Frann Rider in Treatment: 14 Education Assessment Education Provided To: Patient Education Topics Provided Basic Hygiene: Methods: Explain/Verbal Responses: State content correctly Wound Debridement: Methods: Explain/Verbal Responses: State content correctly Wound/Skin Impairment: Methods: Explain/Verbal Responses: State content correctly Electronic Signature(s) Signed: 08/12/2015 6:25:48 PM By: Regan Lemming BSN, RN Entered By: Regan Lemming on 08/12/2015 13:40:27 [name], [name] ([id number]) -------------------------------------------------------------------------------- Wound Assessment Details Patient Name: [name]. Date of Service: 08/12/2015 1:30 PM Medical Record Number: [id number] Patient Account Number: [id number] Date of Birth/Sex: [birth year] (82 y.o. Female) Treating RN: Cornell Barman Primary Care Physician: Emily Filbert Other Clinician: Referring Physician: Emily Filbert Treating Physician/Extender: Frann Rider in Treatment: 14 Wound Status Wound Number: 12 Primary Skin Tear Etiology: Wound Location: Right, Proximal, Anterior Lower Leg Wound Healed - Epithelialized Status: Wounding Event: Gradually Appeared Comorbid Cataracts, Anemia, Arrhythmia, Date Acquired: 07/11/2015 History: Hypertension, Osteoarthritis, Received Weeks Of Treatment: 2 Radiation Clustered Wound: No Photos Photo Uploaded By: Regan Lemming on 08/12/2015 18:04:02 Wound Measurements Length: (cm) 0 Width: (cm) 0 Depth: (cm) 0 Area: (cm) 0.118 Volume: (cm) 0.012 % Reduction in Area: 93.3% % Reduction in Volume: 93.2% Epithelialization: None Tunneling: No Undermining: No Wound Description Classification: Unclassifiable Wound Margin: Distinct, outline attached Exudate Amount: Medium Exudate Type: Serosanguineous Exudate Color: red, brown Foul Odor  After Cleansing: No Wound Bed Granulation Amount: None Present (0%) Exposed Structure Necrotic Amount: Large (67-100%) Fascia Exposed: No Necrotic Quality: Eschar, Adherent Slough Fat Layer Exposed: No Borrero, Kaylla A. ([id number]) Tendon Exposed: No Muscle Exposed: No Joint Exposed: No Bone Exposed: No Limited to Skin Breakdown Periwound Skin Texture Texture Color No Abnormalities Noted: No No Abnormalities Noted: No Callus: No Atrophie Blanche: No Crepitus: No Cyanosis: No Excoriation: No Ecchymosis: No Fluctuance: No Erythema: No Friable: No Hemosiderin Staining: No Induration: No Mottled: No Localized Edema: No Pallor: No Rash: No Rubor: No Scarring: No Temperature / Pain Moisture Temperature: No Abnormality No Abnormalities Noted: No Tenderness on Palpation: Yes Dry / Scaly: No Maceration: No Moist: Yes Wound Preparation Ulcer Cleansing: Rinsed/Irrigated with Saline Topical Anesthetic Applied: Other: lidocaine 4%, Treatment Notes Wound #12 (Right, Proximal, Anterior Lower Leg) 1. Cleansed with: Clean wound with Normal Saline 4. Dressing Applied: Aquacel Ag 5. Secondary [place] Signature(s) Signed: 09/09/2015 4:56:07 PM By: Gretta Cool RN, BSN, Kim RN, BSN Previous Signature: 08/12/2015 6:25:48 PM Version By: Regan Lemming BSN, RN Entered By: Gretta Cool, RN, BSN, Kim on 09/09/2015 12:49:44 [name], [name] ([id number]) -------------------------------------------------------------------------------- Wound Assessment Details Patient Name: [name]. Date of Service: 08/12/2015 1:30 PM Medical Record Number: [id number] Patient Account Number: [id number] Date of Birth/Sex: Sep 19, 1932 (82 y.o. Female) Treating RN: Cornell Barman Primary Care Physician: Emily Filbert Other Clinician: Referring Physician: Emily Filbert Treating Physician/Extender: Frann Rider in Treatment: 14 Wound Status Wound Number: 13 Primary Skin Tear Etiology: Wound  Location: Right, Distal, Anterior Lower Leg Wound Healed - Epithelialized Status: Wounding Event: Gradually Appeared Comorbid Cataracts, Anemia, Arrhythmia, Date Acquired: 07/11/2015 History: Hypertension, Osteoarthritis, Received Weeks Of Treatment: 2 Radiation Clustered Wound: No Photos Photo Uploaded By: Regan Lemming on 08/12/2015 18:04:03 Wound Measurements Length: (cm) 0 Width: (cm) 0 Depth: (cm) 0 Area: (cm) 0.196 Volume: (cm) 0.02 % Reduction in Area: 95% % Reduction in Volume: 94.9% Epithelialization: None Tunneling: No Undermining: No Wound Description Classification:  Partial Thickness Wound Margin: Distinct, outline attached Exudate Amount: Medium Exudate Type: Serosanguineous Exudate Color: red, brown Foul Odor After Cleansing: No Wound Bed Granulation Amount: Small (1-33%) Exposed Structure Granulation Quality: Pink, Pale Fascia Exposed: No Necrotic Amount: Large (67-100%) Fat Layer Exposed: No Merriweather, Shannin A. ([id number]) Necrotic Quality: Eschar Tendon Exposed: No Muscle Exposed: No Joint Exposed: No Bone Exposed: No Limited to Skin Breakdown Periwound Skin Texture Texture Color No Abnormalities Noted: No No Abnormalities Noted: No Callus: No Atrophie Blanche: No Crepitus: No Cyanosis: No Excoriation: No Ecchymosis: No Fluctuance: No Erythema: No Friable: No Hemosiderin Staining: No Induration: No Mottled: No Localized Edema: Yes Pallor: No Rash: No Rubor: No Scarring: No Temperature / Pain Moisture Temperature: No Abnormality No Abnormalities Noted: No Tenderness on Palpation: Yes Dry / Scaly: No Maceration: No Moist: Yes Wound Preparation Ulcer Cleansing: Other: water and soap, Topical Anesthetic Applied: Other: lidocaine 4%, Treatment Notes Wound #13 (Right, Distal, Anterior Lower Leg) 1. Cleansed with: Clean wound with Normal Saline 4. Dressing Applied: Aquacel Ag 5. Secondary [place]  Signature(s) Signed: 09/09/2015 4:56:07 PM By: Gretta Cool RN, BSN, Kim RN, BSN Previous Signature: 08/12/2015 6:25:48 PM Version By: Regan Lemming BSN, RN Entered By: Gretta Cool, RN, BSN, Kim on 09/09/2015 12:49:44 [name], [name] ([id number]) -------------------------------------------------------------------------------- Wound Assessment Details Patient Name: [name]. Date of Service: 08/12/2015 1:30 PM Medical Record Number: [id number] Patient Account Number: [id number] Date of Birth/Sex: Sep 21, 1932 (82 y.o. Female) Treating RN: Afful, RN, BSN, [place] Primary Care Physician: Emily Filbert Other Clinician: Referring Physician: Emily Filbert Treating Physician/Extender: Frann Rider in Treatment: 14 Wound Status Wound Number: 14 Primary Atypical Etiology: Wound Location: Right Malleolus - Lateral Wound Healed - Epithelialized Wounding Event: Gradually Appeared Status: Date Acquired: 07/11/2015 Comorbid Cataracts, Anemia, Arrhythmia, Weeks Of Treatment: 2 History: Hypertension, Osteoarthritis, Received Clustered Wound: No Radiation Photos Photo Uploaded By: Regan Lemming on 08/12/2015 18:04:17 Wound Measurements Length: (cm) 0 % Reduction Width: (cm) 0 % Reduction Depth: (cm) 0 Epitheliali Area: (cm) 0 Tunneling: Volume: (cm) 0 Underminin in Area: 100% in Volume: 100% zation: Large (67-100%) No g: No Wound Description Classification: Partial Thickness Wound Margin: Distinct, outline attached Exudate Amount: None Present Mckibben, Ramona AMarland Kitchen ([id number]) Foul Odor After Cleansing: No Wound Bed Granulation Amount: None Present (0%) Exposed Structure Necrotic Amount: None Present (0%) Fascia Exposed: No Fat Layer Exposed: No Tendon Exposed: No Muscle Exposed: No Joint Exposed: No Bone Exposed: No Limited to Skin Breakdown Periwound Skin Texture Texture Color No Abnormalities Noted: No No Abnormalities Noted: No Callus: No Atrophie Blanche: No Crepitus: No Cyanosis:  No Excoriation: No Ecchymosis: No Fluctuance: No Erythema: No Friable: No Hemosiderin Staining: No Induration: No Mottled: No Localized Edema: Yes Pallor: No Rash: No Rubor: No Scarring: No Temperature / Pain Moisture Temperature: No Abnormality No Abnormalities Noted: No Dry / Scaly: Yes Maceration: No Moist: No Wound Preparation Ulcer Cleansing: Other: water and soap, Topical Anesthetic Applied: None Electronic Signature(s) Signed: 08/12/2015 6:25:48 PM By: Regan Lemming BSN, RN Entered By: Regan Lemming on 08/12/2015 13:38:39 [name], [name] ([id number]) -------------------------------------------------------------------------------- Vitals Details Patient Name: [name]. Date of Service: 08/12/2015 1:30 PM Medical Record Number: [id number] Patient Account Number: [id number] Date of Birth/Sex: 1932-08-14 (82 y.o. Female) Treating RN: Baruch Gouty, RN, BSN, Velva Harman Primary Care Physician: Emily Filbert Other Clinician: Referring Physician: Emily Filbert Treating Physician/Extender: Frann Rider in Treatment: 14 Vital Signs Time Taken: 13:17 Temperature (F): 98.0 Height (in): 68 Pulse (bpm): 53 Weight (lbs): 150 Respiratory Rate (  breaths/min): 16 Body Mass Index (BMI): 22.8 Blood Pressure (mmHg): 118/54 Reference Range: 80 - 120 mg / dl Electronic Signature(s) Signed: 08/12/2015 6:25:48 PM By: Regan Lemming BSN, RN Entered By: Regan Lemming on 08/12/2015 13:22:01

## 2015-08-13 NOTE — Progress Notes (Signed)
BERKLEY, ROHLIK ([id number]) Visit Report for 08/12/2015 Chief Complaint Document Details Patient Name: [name], [name]. Date of Service: 08/12/2015 1:30 PM Medical Record Patient Account Number: [id number] Number: Afful, RN, BSN, Treating RN: October 31, 1932 (82 y.o. Velva Harman Date of Birth/Sex: Female) Other Clinician: Primary Care Physician: Emily Filbert Treating Christin Fudge Referring Physician: Emily Filbert Physician/Extender: Suella Grove in Treatment: 14 Information Obtained from: Patient Chief Complaint has a new open wound to the right lower extremity where she hit her leg on the bath tub about a week and a half ago Electronic Signature(s) Signed: 08/12/2015 2:01:59 PM By: Christin Fudge MD, FACS Entered By: Christin Fudge on 08/12/2015 14:01:59 Girten, [name] ([id number]) -------------------------------------------------------------------------------- Debridement Details Patient Name: [name]. Date of Service: 08/12/2015 1:30 PM Medical Record Patient Account Number: [id number] Number: Afful, RN, BSN, Treating RN: 03/29/1933 (82 y.o. Velva Harman Date of Birth/Sex: Female) Other Clinician: Primary Care Physician: Emily Filbert Treating Christin Fudge Referring Physician: Emily Filbert Physician/Extender: Suella Grove in Treatment: 14 Debridement Performed for Wound #13 Right,Distal,Anterior Lower Leg Assessment: Performed By: Physician Christin Fudge, MD Debridement: Debridement Pre-procedure Yes Verification/Time Out Taken: Start Time: 13:28 Pain Control: Lidocaine 4% Topical Solution Level: Skin/Subcutaneous Tissue Total Area Debrided (L x 0.5 (cm) x 0.5 (cm) = 0.25 (cm) W): Tissue and other Viable, Non-Viable, Eschar, Fibrin/Slough, Skin, Subcutaneous material debrided: Instrument: Forceps Bleeding: Minimum Hemostasis Achieved: Pressure End Time: 13:31 Procedural Pain: 0 Post Procedural Pain: 0 Response to Treatment: Procedure was tolerated well Post Debridement  Measurements of Total Wound Length: (cm) 0.5 Width: (cm) 0.5 Depth: (cm) 0.1 Volume: (cm) 0.02 Post Procedure Diagnosis Same as Pre-procedure Electronic Signature(s) Signed: 08/12/2015 2:01:51 PM By: Christin Fudge MD, FACS Signed: 08/12/2015 6:25:48 PM By: Regan Lemming BSN, RN Entered By: Christin Fudge on 08/12/2015 14:01:51 [place]on, [name] ([id number]) -------------------------------------------------------------------------------- HPI Details Patient Name: [name]. Date of Service: 08/12/2015 1:30 PM Medical Record Patient Account Number: [id number] Number: Afful, RN, BSN, Treating RN: 03-Feb-1933 (82 y.o. Velva Harman Date of Birth/Sex: Female) Other Clinician: Primary Care Physician: Emily Filbert Treating Christin Fudge Referring Physician: Emily Filbert Physician/Extender: Suella Grove in Treatment: 14 History of Present Illness Location: right lower extremity and right ankle Quality: Patient reports experiencing a sharp pain to affected area(s). Severity: Patient states wound are getting worse. Duration: Patient has had the wound for < 2 weeks prior to presenting for treatment Timing: Pain in wound is constant (hurts all the time) Context: The wound occurred when the patient bumped her right lower extremity against the bathtub Modifying Factors: Other treatment(s) tried include:has been trying to apply local ointment at home Associated Signs and Symptoms: Patient reports having increase swelling. HPI Description: 82 year old patient who is known to the wound center for several years now has a new injury to the right lower extremity where she bumped herself against the bath tub. He also has had a chronic right lateral malleolus open wound which has been coming on and off for several years now. This has been there for at least 2014 and was healed out in November 2016. In the past she has been seen by her cardiologist Dr. Kathlyn Sacramento in April 2016. Noninvasive vascular evaluation  showed an ABI of 0.68 on the right and 0.85 on the left. 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 One-vessel runoff below the knee via the peroneal artery with short occlusion of the anterior tibial artery and reconstitution. No intervention performed. Most recently she was seen  for a wound on her right lower extremity which was healed out by March 15 She is not a smoker and has had no diabetes mellitus. 07/29/2015 -- the appointment with her cardiologist Dr. Fletcher Anon is still pending for review of her arterial duplex studies. Electronic Signature(s) Signed: 08/12/2015 2:02:06 PM By: Christin Fudge MD, FACS Entered By: Christin Fudge on 08/12/2015 14:02:06 Buss, [name] ([id number]) -------------------------------------------------------------------------------- Physical Exam Details Patient Name: [name]. Date of Service: 08/12/2015 1:30 PM Medical Record Patient Account Number: [id number] Number: Afful, RN, BSN, Treating RN: 1932/11/07 (82 y.o. Velva Harman Date of Birth/Sex: Female) Other Clinician: Primary Care Physician: Emily Filbert Treating Christin Fudge Referring Physician: Emily Filbert Physician/Extender: Suella Grove in Treatment: 14 Constitutional . Pulse regular. Respirations normal and unlabored. Afebrile. . Eyes Nonicteric. Reactive to light. Ears, Nose, Mouth, and Throat Lips, teeth, and gums WNL.Marland Kitchen Moist mucosa without lesions. Neck supple and nontender. No palpable supraclavicular or cervical adenopathy. Normal sized without goiter. Respiratory WNL. No retractions.. Breath sounds WNL, No rubs, rales, rhonchi, or wheeze.. Cardiovascular Heart rhythm and rate regular, no murmur or gallop.. Pedal Pulses WNL. No clubbing, cyanosis or edema. Chest Breasts symmetical and no nipple discharge.. Breast tissue WNL, no masses, lumps, or tenderness.. Lymphatic No adneopathy. No adenopathy. No  adenopathy. Musculoskeletal Adexa without tenderness or enlargement.. Digits and nails w/o clubbing, cyanosis, infection, petechiae, ischemia, or inflammatory conditions.. Integumentary (Hair, Skin) No suspicious lesions. No crepitus or fluctuance. No peri-wound warmth or erythema. No masses.Marland Kitchen Psychiatric Judgement and insight Intact.. No evidence of depression, anxiety, or agitation.. Notes the eschar on the right lateral malleolus was gently removed and there was no open ulceration. The right anterior calf where she had significant amount of eschar and also subcutaneous tissue was sharply debrided with forceps and moist saline gauze and all the debris removed down to healthy granulation tissue. Not much of bleeding. Electronic Signature(s) Signed: 08/12/2015 2:02:49 PM By: Christin Fudge MD, FACS Entered By: Christin Fudge on 08/12/2015 14:02:48 Buscemi, [name] ([id number]) Convey, [name] ([id number]) -------------------------------------------------------------------------------- Physician Orders Details Patient Name: [name] Date of Service: 08/12/2015 1:30 PM Medical Record Patient Account Number: [id number] Number: Afful, RN, BSN, Treating RN: 1932-05-28 (82 y.o. Velva Harman Date of Birth/Sex: Female) Other Clinician: Primary Care Physician: Emily Filbert Treating Christin Fudge Referring Physician: Emily Filbert Physician/Extender: Suella Grove in Treatment: 30 Verbal / Phone Orders: Yes Clinician: Afful, RN, BSN, Rita Read Back and Verified: Yes Diagnosis Coding Wound Cleansing Wound #12 Right,Proximal,Anterior Lower Leg o Cleanse wound with mild soap and water o May Shower, gently pat wound dry prior to applying new dressing. o May shower with protection. Wound #13 Right,Distal,Anterior Lower Leg o Cleanse wound with mild soap and water o May Shower, gently pat wound dry prior to applying new dressing. o May shower with protection. Wound #14 Right,Lateral  Malleolus o Cleanse wound with mild soap and water o May Shower, gently pat wound dry prior to applying new dressing. o May shower with protection. Anesthetic Wound #12 Right,Proximal,Anterior Lower Leg o Topical Lidocaine 4% cream applied to wound bed prior to debridement Wound #13 Right,Distal,Anterior Lower Leg o Topical Lidocaine 4% cream applied to wound bed prior to debridement Wound #14 Right,Lateral Malleolus o Topical Lidocaine 4% cream applied to wound bed prior to debridement Skin Barriers/Peri-Wound Care Wound #12 Right,Proximal,Anterior Lower Leg o Barrier cream Wound #13 Right,Distal,Anterior Lower Leg o Barrier cream Wound #14 Right,Lateral Malleolus o Barrier cream TANISE, DELORBE ([id number]) Primary Wound Dressing Wound #12 Right,Proximal,Anterior Lower Leg   o Aquacel Ag Wound #13 Right,Distal,Anterior Lower Leg o Aquacel Ag Wound #14 Right,Lateral Malleolus o Aquacel Ag Secondary Dressing Wound #12 Right,Proximal,Anterior Lower Leg o Non-adherent pad Wound #13 Right,Distal,Anterior Lower Leg o Non-adherent pad Dressing Change Frequency Wound #12 Right,Proximal,Anterior Lower Leg o Change dressing every other day. Wound #13 Right,Distal,Anterior Lower Leg o Change dressing every other day. Wound #14 Right,Lateral Malleolus o Change dressing every other day. Follow-up Appointments Wound #12 Right,Proximal,Anterior Lower Leg o Return Appointment in 1 week. Wound #13 Right,Distal,Anterior Lower Leg o Return Appointment in 1 week. Wound #14 Right,Lateral Malleolus o Return Appointment in 1 week. Edema Control Wound #12 Right,Proximal,Anterior Lower Leg o Patient to wear own compression stockings o Elevate legs to the level of the heart and pump ankles as often as possible Wound #13 Right,Distal,Anterior Lower Leg o Patient to wear own compression stockings o Elevate legs to the level of the heart and pump  ankles as often as possible Spare, Preet A. ([id number]) Wound #14 Right,Lateral Malleolus o Patient to wear own compression stockings o Elevate legs to the level of the heart and pump ankles as often as possible Additional Orders / Instructions Wound #12 Right,Proximal,Anterior Lower Leg o Increase protein intake. o Activity as tolerated Wound #13 Right,Distal,Anterior Lower Leg o Increase protein intake. o Activity as tolerated Wound #14 Right,Lateral Malleolus o Increase protein intake. o Activity as tolerated Electronic Signature(s) Signed: 08/12/2015 4:28:22 PM By: Christin Fudge MD, FACS Signed: 08/12/2015 6:25:48 PM By: Regan Lemming BSN, RN Entered By: Regan Lemming on 08/12/2015 13:35:15 Pritz, [name] ([id number]) -------------------------------------------------------------------------------- Problem List Details Patient Name: [name]. Date of Service: 08/12/2015 1:30 PM Medical Record Patient Account Number: [id number] Number: Afful, RN, BSN, Treating RN: [birth year] (82 y.o. 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 S81.811A Laceration without foreign body, right lower leg, initial 07/23/2015 Yes encounter Inactive Problems Resolved Problems ICD-10 Code Description Active Date Resolved Date S81.801A Unspecified open wound, right lower leg, initial encounter 05/05/2015 05/05/2015 L03.115 Cellulitis of right lower limb 05/05/2015 05/05/2015 Electronic Signature(s) Signed: 08/12/2015 2:01:13 PM By: Christin Fudge MD, FACS Entered By: Christin Fudge on 08/12/2015 14:01:13 [place], [name] ([id number]) [place], [name] ([id number]) -------------------------------------------------------------------------------- Progress Note Details Patient Name: [name]. Date of Service: 08/12/2015 1:30 PM Medical Record Patient Account Number: [id number] Number: Afful, RN, BSN, Treating RN: [birth year] (82 y.o. Velva Harman Date of Birth/Sex: Female) Other Clinician: Primary Care Physician: Emily Filbert Treating Christin Fudge Referring Physician: Emily Filbert Physician/Extender: Suella Grove in Treatment: 14 Subjective Chief Complaint Information obtained from Patient has a new open wound to the right lower extremity where she hit her leg on the bath tub about a week and a half ago History of Present Illness (HPI) The following HPI elements were documented for the patient's wound: Location: right lower extremity and right ankle Quality: Patient reports experiencing a sharp pain to affected area(s). Severity: Patient states wound are getting worse. Duration: Patient has had the wound for < 2 weeks prior to presenting for treatment Timing: Pain in wound is constant (hurts all the time) Context: The wound occurred when the patient bumped her right lower extremity against  the bathtub Modifying Factors: Other treatment(s) tried include:has been trying to apply local ointment at home Associated Signs and Symptoms: Patient reports having increase swelling. 82 year old patient who is known to the wound center for several years now has a new injury to the right lower extremity where she bumped herself against the bath tub. He also has had a chronic right lateral malleolus open wound which has been coming on and off for several years now. This has been there for at least 2014 and was healed out in November 2016. In the past she has been seen by her cardiologist Dr. Kathlyn Sacramento in April 2016. Noninvasive  vascular evaluation showed an ABI of 0.68 on the right and 0.85 on the left. 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 One-vessel runoff below the knee via the peroneal artery with short occlusion of the anterior tibial artery and reconstitution. No intervention performed. Most recently she was seen for a wound on her right lower extremity which was healed out by March 15 She is not a smoker and has had no diabetes mellitus. 07/29/2015 -- the appointment with her cardiologist Dr. Fletcher Anon is still pending for review of her arterial duplex studies. YANISE, TUMBLESON ([id number]) Objective Constitutional Pulse regular. Respirations normal and unlabored. Afebrile. Vitals Time Taken: 1:17 PM, Height: 68 in, Weight: 150 lbs, BMI: 22.8, Temperature: 98.0 F, Pulse: 53 bpm, Respiratory Rate: 16 breaths/min, Blood Pressure: 118/54 mmHg. Eyes Nonicteric. Reactive to light. Ears, Nose, Mouth, and Throat Lips, teeth, and gums WNL.Marland Kitchen Moist mucosa without lesions. Neck supple and nontender. No palpable supraclavicular or cervical adenopathy. Normal sized without goiter. Respiratory WNL. No retractions.. Breath sounds WNL, No rubs, rales, rhonchi, or wheeze.. Cardiovascular Heart rhythm and rate regular, no murmur or gallop.. Pedal Pulses WNL. No clubbing, cyanosis or edema. Chest Breasts symmetical and no nipple discharge.. Breast tissue WNL, no masses, lumps, or tenderness.. Lymphatic No adneopathy. No adenopathy. No adenopathy. Musculoskeletal Adexa without tenderness or enlargement.. Digits and nails w/o clubbing, cyanosis, infection, petechiae, ischemia, or inflammatory conditions.Marland Kitchen Psychiatric Judgement and insight Intact.. No evidence of depression, anxiety, or agitation.. General Notes: the eschar on the right lateral malleolus was gently removed and there was no open ulceration. The right anterior calf where she had  significant amount of eschar and also subcutaneous tissue was sharply debrided with forceps and moist saline gauze and all the debris removed down to healthy granulation tissue. Not much of bleeding. Integumentary (Hair, Skin) No suspicious lesions. No crepitus or fluctuance. No peri-wound warmth or erythema. No masses.. Wound #12 status is Open. Original cause of wound was Gradually Appeared. The wound is located on the Right,Proximal,Anterior Lower Leg. The wound measures 0.5cm length x 0.3cm width x 0.1cm depth; 0.118cm^2 area and 0.012cm^3 volume. The wound is limited to skin breakdown. There is no tunneling or Tagle, Stephanieann A. ([id number]) undermining noted. There is a medium amount of serosanguineous drainage noted. The wound margin is distinct with the outline attached to the wound base. There is no granulation within the wound bed. There is a large (67-100%) amount of necrotic tissue within the wound bed including Eschar and Adherent Slough. The periwound skin appearance exhibited: Moist. The periwound skin appearance did not exhibit: Callus, Crepitus, Excoriation, Fluctuance, Friable, Induration, Localized Edema, Rash, Scarring, Dry/Scaly, Maceration, Atrophie Blanche, Cyanosis, Ecchymosis, Hemosiderin Staining, Mottled, Pallor, Rubor, Erythema. Periwound temperature was noted as No Abnormality. The periwound has tenderness on palpation. Wound #13 status is Open. Original  cause of wound was Gradually Appeared. The wound is located on the Right,Distal,Anterior Lower Leg. The wound measures 0.5cm length x 0.5cm width x 0.1cm depth; 0.196cm^2 area and 0.02cm^3 volume. The wound is limited to skin breakdown. There is no tunneling or undermining noted. There is a medium amount of serosanguineous drainage noted. The wound margin is distinct with the outline attached to the wound base. There is small (1-33%) pink, pale granulation within the wound bed. There is a large (67-100%) amount of necrotic  tissue within the wound bed including Eschar. The periwound skin appearance exhibited: Localized Edema, Moist. The periwound skin appearance did not exhibit: Callus, Crepitus, Excoriation, Fluctuance, Friable, Induration, Rash, Scarring, Dry/Scaly, Maceration, Atrophie Blanche, Cyanosis, Ecchymosis, Hemosiderin Staining, Mottled, Pallor, Rubor, Erythema. Periwound temperature was noted as No Abnormality. The periwound has tenderness on palpation. Wound #14 status is Healed - Epithelialized. Original cause of wound was Gradually Appeared. The wound is located on the Right,Lateral Malleolus. The wound measures 0cm length x 0cm width x 0cm depth; 0cm^2 area and 0cm^3 volume. The wound is limited to skin breakdown. There is no tunneling or undermining noted. There is a none present amount of drainage noted. The wound margin is distinct with the outline attached to the wound base. There is no granulation within the wound bed. There is no necrotic tissue within the wound bed. The periwound skin appearance exhibited: Localized Edema, Dry/Scaly. The periwound skin appearance did not exhibit: Callus, Crepitus, Excoriation, Fluctuance, Friable, Induration, Rash, Scarring, Maceration, Moist, Atrophie Blanche, Cyanosis, Ecchymosis, Hemosiderin Staining, Mottled, Pallor, Rubor, Erythema. Periwound temperature was noted as No Abnormality. 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 ([id number]) Wound #13 Wound #13 is a Skin Tear located on the Right,Distal,Anterior Lower Leg . There was a Skin/Subcutaneous Tissue Debridement [id number]) debridement with total area of 0.25 sq cm performed by Christin Fudge, MD. with  the following instrument(s): Forceps to remove Viable and Non-Viable tissue/material including Fibrin/Slough, Eschar, Skin, and Subcutaneous after achieving pain control using Lidocaine 4% Topical Solution. A time out was conducted prior to the start of the procedure. A Minimum amount of bleeding was controlled with Pressure. The procedure was tolerated well with a pain level of 0 throughout and a pain level of 0 following the procedure. Post Debridement Measurements: 0.5cm length x 0.5cm width x 0.1cm depth; 0.02cm^3 volume. Post procedure Diagnosis Wound #13: Same as Pre-Procedure Plan Wound Cleansing: Wound #12 Right,Proximal,Anterior Lower Leg: Cleanse wound with mild soap and water May Shower, gently pat wound dry prior to applying new dressing. May shower with protection. Wound #13 Right,Distal,Anterior Lower Leg: Cleanse wound with mild soap and water May Shower, gently pat wound dry prior to applying new dressing. May shower with protection. Wound #14 Right,Lateral Malleolus: Cleanse wound with mild soap and water May Shower, gently pat wound dry prior to applying new dressing. May shower with protection. Anesthetic: Wound #12 Right,Proximal,Anterior Lower Leg: Topical Lidocaine 4% cream applied to wound bed prior to debridement Wound #13 Right,Distal,Anterior Lower Leg: Topical Lidocaine 4% cream applied to wound bed prior to debridement Wound #14 Right,Lateral Malleolus: Topical Lidocaine 4% cream applied to wound bed prior to debridement Skin Barriers/Peri-Wound Care: Wound #12 Right,Proximal,Anterior Lower Leg: Barrier cream Wound #13 Right,Distal,Anterior Lower Leg: Barrier cream Wound #  14 Right,Lateral Malleolus: Barrier cream Primary Wound Dressing: Wound #12 Right,Proximal,Anterior Lower Leg: Aquacel Ag Wound #13 Right,Distal,Anterior Lower Leg: ALYDIA, ZELDIN. ([id number]) Aquacel Ag Wound #14 Right,Lateral Malleolus: Aquacel Ag Secondary Dressing: Wound #12  Right,Proximal,Anterior Lower Leg: Non-adherent pad Wound #13 Right,Distal,Anterior Lower Leg: Non-adherent pad Dressing Change Frequency: Wound #12 Right,Proximal,Anterior Lower Leg: Change dressing every other day. Wound #13 Right,Distal,Anterior Lower Leg: Change dressing every other day. Wound #14 Right,Lateral Malleolus: Change dressing every other day. Follow-up Appointments: Wound #12 Right,Proximal,Anterior Lower Leg: Return Appointment in 1 week. Wound #13 Right,Distal,Anterior Lower Leg: Return Appointment in 1 week. Wound #14 Right,Lateral Malleolus: Return Appointment in 1 week. Edema Control: Wound #12 Right,Proximal,Anterior Lower Leg: Patient to wear own compression stockings Elevate legs to the level of the heart and pump ankles as often as possible Wound #13 Right,Distal,Anterior Lower Leg: Patient to wear own compression stockings Elevate legs to the level of the heart and pump ankles as often as possible Wound #14 Right,Lateral Malleolus: Patient to wear own compression stockings Elevate legs to the level of the heart and pump ankles as often as possible Additional Orders / Instructions: Wound #12 Right,Proximal,Anterior Lower Leg: Increase protein intake. Activity as tolerated Wound #13 Right,Distal,Anterior Lower Leg: Increase protein intake. Activity as tolerated Wound #14 Right,Lateral Malleolus: Increase protein intake. Activity as tolerated I have recommended: SHONTAVIA, MOSELEY. ([id number]) 1. Silver alginate to be applied to the right anterior calf and shin. 2. since her ABIs could not be measured I have recommended we do a light Kerlix and Coban and I have recommended elevation as much as possible 3. Review by her cardiologist Dr Fletcher Anon, who may want to repeat a arterial duplex study to see the arterial flow to her right lower extremity as the last angiogram was done about a year ago.I understand that appointment is next week 4. We'll see her back next  week for review Electronic Signature(s) Signed: 08/12/2015 2:04:11 PM By: Christin Fudge MD, FACS Entered By: Christin Fudge on 08/12/2015 14:04:11 [place], [name] ([id number]) -------------------------------------------------------------------------------- SuperBill Details Patient Name: [name]. Date of Service: 08/12/2015 Medical Record Patient Account Number: [id number] Number: Afful, RN, BSN, Treating RN: Nov 21, 1932 (82 y.o. 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 S81.811A Laceration without foreign body, right lower leg, initial encounter Facility Procedures CPT4 Code Description: [id number] - DEB SUBQ TISSUE 20 SQ CM/< ICD-10 Description Diagnosis I48.2 Chronic atrial fibrillation I70.232 Atherosclerosis of native arteries of right leg with S81.811A Laceration without foreign body, right lower leg, ini Modifier: ulceration of c tial encounter Quantity: 1 alf Physician Procedures CPT4 Code Description: [id number] - WC PHYS SUBQ TISS 20 SQ CM ICD-10 Description Diagnosis I48.2 Chronic atrial fibrillation I70.232 Atherosclerosis of native arteries of right leg with S81.811A Laceration without foreign body, right lower leg, ini Modifier: ulceration of ca tial encounter Quantity: 1 lf Electronic Signature(s) Signed: 08/12/2015 2:04:23 PM By: Christin Fudge MD, FACS Entered By: Christin Fudge on 08/12/2015 14:04:22

## 2015-08-16 DIAGNOSIS — M5136 Other intervertebral disc degeneration, lumbar region: Secondary | ICD-10-CM | POA: Diagnosis not present

## 2015-08-16 DIAGNOSIS — M4806 Spinal stenosis, lumbar region: Secondary | ICD-10-CM | POA: Diagnosis not present

## 2015-08-16 DIAGNOSIS — M5416 Radiculopathy, lumbar region: Secondary | ICD-10-CM | POA: Diagnosis not present

## 2015-08-19 ENCOUNTER — Ambulatory Visit: Payer: PPO | Admitting: Surgery

## 2015-08-19 ENCOUNTER — Other Ambulatory Visit: Payer: Self-pay | Admitting: Physical Medicine and Rehabilitation

## 2015-08-19 DIAGNOSIS — M5136 Other intervertebral disc degeneration, lumbar region: Secondary | ICD-10-CM

## 2015-08-19 DIAGNOSIS — M48062 Spinal stenosis, lumbar region with neurogenic claudication: Secondary | ICD-10-CM

## 2015-08-30 ENCOUNTER — Ambulatory Visit (INDEPENDENT_AMBULATORY_CARE_PROVIDER_SITE_OTHER): Payer: PPO | Admitting: Cardiovascular Disease

## 2015-08-30 ENCOUNTER — Encounter: Payer: Self-pay | Admitting: Cardiovascular Disease

## 2015-08-30 VITALS — BP 124/54 | HR 53 | Ht 68.0 in | Wt 145.5 lb

## 2015-08-30 DIAGNOSIS — I480 Paroxysmal atrial fibrillation: Secondary | ICD-10-CM

## 2015-08-30 DIAGNOSIS — I739 Peripheral vascular disease, unspecified: Secondary | ICD-10-CM

## 2015-08-30 NOTE — Progress Notes (Signed)
Cardiology Office Note   Date:  08/30/2015   ID:  Pricilla Holm Bailey, DOB [birth year], MRN [id number]  PCP:  Rusty Aus, MD  Cardiologist:   Kathlyn Sacramento, MD   Chief Complaint  Patient presents with  . other    6 month f/u c/o leg weakness. Meds reviewed verbally.      History of Present Illness: [name] is a 82 y.o. female who presents for peripheral arterial disease and atrial fibrillation.  She had previous previous cardioversion in 2012. She has been maintaining in sinus rhythm with amiodarone. She has no history of coronary artery disease with previous nuclear stress test in 2012. Anticoagulation with warfarin was stopped due to frequent falls. She has chronic kidney disease with chronic leg edema. She was seen in April, 2016 for bilateral leg pain. She had previous right knee replacements with some improvement in her symptoms. She was noted to have chronic ulceration on the right lateral ankle for few years which has required wound care.   Noninvasive vascular evaluation showed an ABI of 0.68 on the right and 0.85 on the left. Angiography in May, 2016 showed:  1. No significant aortoiliac disease. 2. Moderate nonobstructive disease affecting the distal right SFA and popliteal artery. One-vessel runoff below the knee via the peroneal artery with short occlusion of the anterior tibial artery and reconstitution.  In spite of continued wound care, the patient continues to have the ulceration on the right lateral ankle. Fortunately, currently with minimal oozing. This has persisted in spite of treating her edema. She describes cramps in both thighs at rest and with physical activities.   Past Medical History  Diagnosis Date  . Hypertension   . Hyperlipidemia   . Hypothyroid   . Atrial fibrillation ([place])     Echocardiogram 5/12: normal LV function, moderate LAE, mild RAE, mild AI and MR and mild-to-moderate TR.  Myoview 5/ 12 showed an ejection fraction of 73% and normal  perfusion.   Patient underwent elective DCCV on 08/26/10    Past Surgical History  Procedure Laterality Date  . Abdominal hysterectomy    . Back surgery    . Total knee arthroplasty    . Tmj arthroplasty    . Tonsillectomy    . Appendectomy    . Cardioversion  11/10/2011    Procedure: CARDIOVERSION;  Surgeon: Lelon Perla, MD;  Location: [facility];  Service: Cardiovascular;  Laterality: N/A;     Current Outpatient Prescriptions  Medication Sig Dispense Refill  . amiodarone (PACERONE) 200 MG tablet Take 1 tablet (200 mg total) by mouth daily. 90 tablet 3  . anastrozole (ARIMIDEX) 1 MG tablet Take 1 mg by mouth Daily.     . Ascorbic Acid (VITAMIN C PO) Take 1 tablet by mouth daily.    Marland Kitchen aspirin EC 81 MG tablet Take 1 tablet (81 mg total) by mouth daily. 90 tablet 3  . calcium-vitamin D (OSCAL WITH D) 500-200 MG-UNIT per tablet Take 1 tablet by mouth daily with breakfast.    . Cholecalciferol (VITAMIN D-3 PO) Take 1 tablet by mouth daily.    Marland Kitchen docusate sodium (COLACE) 100 MG capsule Take 100 mg by mouth daily as needed for mild constipation.    . Iron-Vitamin C 65-125 MG TABS Take 1 tablet by mouth daily.    Marland Kitchen levothyroxine (SYNTHROID, LEVOTHROID) 150 MCG tablet Take 150 mcg by mouth daily.     Marland Kitchen lisinopril (PRINIVIL,ZESTRIL) 10 MG tablet Take 10 mg by mouth  daily.    . metoprolol (LOPRESSOR) 100 MG tablet Take 0.5 tablets (50 mg total) by mouth 2 (two) times daily. (Patient taking differently: Take 50 mg by mouth daily. )    . Multiple Vitamin (MULTIVITAMIN) tablet Take 1 tablet by mouth daily.      Marland Kitchen omeprazole (PRILOSEC) 40 MG capsule Take 40 mg by mouth daily.    . potassium chloride (KLOR-CON) 10 MEQ CR tablet Take 20 mEq by mouth daily. PT TAKES TWO EXTRA TABLETS WITH EXTRA FLUID PILL    . pravastatin (PRAVACHOL) 40 MG tablet Take 40 mg by mouth at bedtime.     Marland Kitchen spironolactone (ALDACTONE) 25 MG tablet Take 25 mg by mouth daily.    Marland Kitchen torsemide (DEMADEX) 20 MG tablet Take 2 tablets  (40 mg total) by mouth daily. (Patient taking differently: Take 20 mg by mouth daily. ) 60 tablet 3  . venlafaxine (EFFEXOR-XR) 75 MG 24 hr capsule Take 75 mg by mouth daily.       No current facility-administered medications for this visit.    Allergies:   Amlodipine; Ciprofloxacin; Doxycycline; Lipitor; Macrobid; Penicillins; and Sulfamethoxazole-trimethoprim    Social History:  The patient  reports that she has never smoked. She does not have any smokeless tobacco history on file. She reports that she does not drink alcohol.   Family History:  The patient's family history includes Coronary artery disease (age of onset: 38) in her sister; Heart attack in her father.    ROS:  Please see the history of present illness.   Otherwise, review of systems are positive for none.   All other systems are reviewed and negative.    PHYSICAL EXAM: VS:  BP 124/54 mmHg  Pulse 53  Ht 5\' 8"  (1.727 m)  Wt 145 lb 8 oz (65.998 kg)  BMI 22.13 kg/m2 , BMI Body mass index is 22.13 kg/(m^2). GEN: Well nourished, well developed, in no acute distress HEENT: normal Neck: no JVD, carotid bruits, or masses Cardiac: RRR; no murmurs, rubs, or gallops,no edema  Respiratory:  clear to auscultation bilaterally, normal work of breathing GI: soft, nontender, nondistended, + BS MS: no deformity or atrophy Skin: warm and dry, no rash Neuro:  Strength and sensation are intact Psych: euthymic mood, full affect Femoral pulses are normal. Distal pulses are not palpable.  EKG:  EKG is ordered today. The ekg ordered today demonstrates sinus bradycardia with no significant ST or T wave changes.   Recent Labs: 11/11/2014: BUN 34*; Creatinine, Ser 1.60*; Potassium 4.7; Sodium 143    Lipid Panel No results found for: CHOL, TRIG, HDL, CHOLHDL, VLDL, LDLCALC, LDLDIRECT    Wt Readings from Last 3 Encounters:  08/30/15 145 lb 8 oz (65.998 kg)  03/01/15 152 lb 8 oz (69.174 kg)  10/29/14 149 lb 8 oz (67.813 kg)         ASSESSMENT AND PLAN:  1.  Peripheral arterial disease with continued nonhealing ulcer on the right lateral ankle. This is in the distribution of the anterior tibial artery and I reviewed the angiogram with her from last year. The anterior tibial artery had short occlusion proximally and we should try to revascularize this area in order to improve her chances of healing the ulcer. Thus, I recommend proceeding with repeat angiography with the intention of endovascular intervention on the right anterior tibial artery. I discussed risks and benefits. She does have chronic kidney disease and will gently hydrate her before and after the procedure.  2. Persistent atrial fibrillation:  Currently maintaining in sinus rhythm with amiodarone.   3. Chronic diastolic heart failure: She appears to be euvolemic.  4. Essential hypertension: Blood pressure is controlled on current medications.      Disposition:   FU with me in 1 month  Signed, Kathlyn Sacramento, MD  08/30/2015 1:58 PM    [facility]

## 2015-08-30 NOTE — Patient Instructions (Addendum)
Medication Instructions:  Your physician recommends that you continue on your current medications as directed. Please refer to the Current Medication list given to you today.   Labwork: Labs today CBC, BMP, PT/INR   Testing/Procedures: Your physician has requested that you have a abdominal aortogram with right lower extremity angiography and possible angioplasty.     You are scheduled for this test on:__Wednesday May 24, 2017_____  Please arrive at __06:30AM_am on the day of your procedure  Do not eat/drink anything after midnight  Someone will need to drive you home  It is recommended someone be with you for the first 24 hours after your procedure  Wear clothes that are easy to get on/off and wear slip on shoes if possible   Medications bring a current list of all medications with you   _X__ Do not take these medications before your procedure:__Hold your torsemide the morning of your procedure. _   Day of your procedure: Arrive at short stay that morning at [facility] in [place].   The usual length of stay after your procedure is about 2 to 3 hours.  This can vary.  If you have any questions, please call our office at [phone], or you may call the cardiac cath lab at [facility] directly at [phone].     Follow-Up: Your physician recommends that you schedule a follow-up appointment in: 1 month with Dr. Fletcher Anon  Date & Time: ____________________________________________________________   Any Other Special Instructions Will Be Listed Below (If Applicable).     If you need a refill on your cardiac medications before your next appointment, please call your pharmacy.  Angiogram An angiogram, also called angiography, is a procedure used to look at the blood vessels. In this procedure, dye is injected through a long, thin tube (catheter) into an artery. X-rays are then taken. The X-rays will show if there is a blockage or problem in a blood  vessel.  LET [facility] CARE PROVIDER KNOW ABOUT: 7. Any allergies you have, including allergies to shellfish or contrast dye.  8. All medicines you are taking, including vitamins, herbs, eye drops, creams, and over-the-counter medicines.  9. Previous problems you or members of your family have had with the use of anesthetics.  10. Any blood disorders you have.  11. Previous surgeries you have had. 12. Any previous kidney problems or failure you have had. 13. Medical conditions you have.  14. Possibility of pregnancy, if this applies. RISKS AND COMPLICATIONS Generally, an angiogram is a safe procedure. However, as with any procedure, problems can occur. Possible problems include:  Injury to the blood vessels, including rupture or bleeding.  Infection or bruising at the catheter site.  Allergic reaction to the dye or contrast used.  Kidney damage from the dye or contrast used.  Blood clots that can lead to a stroke or heart attack. BEFORE THE PROCEDURE  Do not eat or drink after midnight on the night before the procedure, or as directed by your health care provider.   Ask your health care provider if you may drink enough water to take any needed medicines the morning of the procedure.  PROCEDURE  You may be given a medicine to help you relax (sedative) before and during the procedure. This medicine is given through an IV access tube that is inserted into one of your veins.   The area where the catheter will be inserted will be washed and shaved. This is usually done in the groin but may  be done in the fold of your arm (near your elbow) or in the wrist.  A medicine will be given to numb the area where the catheter will be inserted (local anesthetic).  The catheter will be inserted with a guide wire into an artery. The catheter is guided by using a type of X-ray (fluoroscopy) to the blood vessel being examined.   Dye is then injected into the catheter, and X-rays are  taken. The dye helps to show where any narrowing or blockages are located.  AFTER THE PROCEDURE   If the procedure is done through the leg, you will be kept in bed lying flat for several hours. You will be instructed to not bend or cross your legs.  The insertion site will be checked frequently.  The pulse in your feet or wrist will be checked frequently.  Additional blood tests, X-rays, and electrocardiography may be done.   You may need to stay in the hospital overnight for observation.    This information is not intended to replace advice given to you by your health care provider. Make sure you discuss any questions you have with your health care provider.   Document Released: 01/11/2005 Document Revised: 04/24/2014 Document Reviewed: 09/04/2012 Elsevier Interactive Patient Education 2016 [place] After These instructions give you information about caring for yourself after your procedure. Your doctor may also give you more specific instructions. Call your doctor if you have any problems or questions after your procedure.  HOME CARE 15. Take medicines only as told by your doctor. 16. Follow your doctor's instructions about: 1. Care of the area where the tube was inserted. 2. Bandage (dressing) changes and removal. 17. You may shower 24-48 hours after the procedure or as told by your doctor. 18. Do not take baths, swim, or use a hot tub until your doctor approves. 19. Every day, check the area where the tube was inserted. Watch for: 1. Redness, swelling, or pain. 2. Fluid, blood, or pus. 20. Do not apply powder or lotion to the site. 21. Do not lift anything that is heavier than 10 lb (4.5 kg) for 5 days or as told by your doctor. 51. Ask your doctor when you can: 1. Return to work or school. 2. Do physical activities or play sports. 3. Have sex. 23. Do not drive or operate heavy machinery for 24 hours or as told by your doctor. 24. Have someone with  you for the first 24 hours after the procedure. 25. Keep all follow-up visits as told by your doctor. This is important. GET HELP IF:  You have a fever.   You have chills.   You have more bleeding from the area where the tube was inserted. Hold pressure on the area.  You have redness, swelling, or pain in the area where the tube was inserted.  You have fluid or pus coming from the area. GET HELP RIGHT AWAY IF:   You have a lot of pain in the area where the tube was inserted.  The area where the tube was inserted is bleeding, and the bleeding does not stop after 30 minutes of holding steady pressure on the area.  The area near or just beyond the insertion site becomes pale, cool, tingly, or numb.   This information is not intended to replace advice given to you by your health care provider. Make sure you discuss any questions you have with your health care provider.   Document Released: 06/30/2008 Document  Revised: 04/24/2014 Document Reviewed: 09/04/2012 Elsevier Interactive Patient Education [employer].

## 2015-08-31 LAB — SPECIMEN STATUS

## 2015-09-01 ENCOUNTER — Other Ambulatory Visit: Payer: Self-pay | Admitting: Family Medicine

## 2015-09-01 ENCOUNTER — Inpatient Hospital Stay: Payer: PPO | Attending: Oncology | Admitting: Family Medicine

## 2015-09-01 ENCOUNTER — Other Ambulatory Visit: Payer: PPO

## 2015-09-01 ENCOUNTER — Inpatient Hospital Stay: Payer: PPO

## 2015-09-01 ENCOUNTER — Encounter: Payer: Self-pay | Admitting: Family Medicine

## 2015-09-01 ENCOUNTER — Ambulatory Visit: Payer: PPO | Admitting: Oncology

## 2015-09-01 VITALS — BP 96/56 | HR 50 | Temp 96.0°F | Wt 147.3 lb

## 2015-09-01 DIAGNOSIS — R609 Edema, unspecified: Secondary | ICD-10-CM | POA: Diagnosis not present

## 2015-09-01 DIAGNOSIS — M129 Arthropathy, unspecified: Secondary | ICD-10-CM | POA: Insufficient documentation

## 2015-09-01 DIAGNOSIS — Z7982 Long term (current) use of aspirin: Secondary | ICD-10-CM | POA: Insufficient documentation

## 2015-09-01 DIAGNOSIS — Z853 Personal history of malignant neoplasm of breast: Secondary | ICD-10-CM | POA: Insufficient documentation

## 2015-09-01 DIAGNOSIS — D0511 Intraductal carcinoma in situ of right breast: Secondary | ICD-10-CM

## 2015-09-01 DIAGNOSIS — E785 Hyperlipidemia, unspecified: Secondary | ICD-10-CM | POA: Diagnosis not present

## 2015-09-01 DIAGNOSIS — I10 Essential (primary) hypertension: Secondary | ICD-10-CM | POA: Insufficient documentation

## 2015-09-01 DIAGNOSIS — Z79899 Other long term (current) drug therapy: Secondary | ICD-10-CM | POA: Diagnosis not present

## 2015-09-01 DIAGNOSIS — R531 Weakness: Secondary | ICD-10-CM | POA: Diagnosis not present

## 2015-09-01 DIAGNOSIS — R5383 Other fatigue: Secondary | ICD-10-CM | POA: Diagnosis not present

## 2015-09-01 DIAGNOSIS — I4891 Unspecified atrial fibrillation: Secondary | ICD-10-CM | POA: Insufficient documentation

## 2015-09-01 DIAGNOSIS — E039 Hypothyroidism, unspecified: Secondary | ICD-10-CM | POA: Insufficient documentation

## 2015-09-01 DIAGNOSIS — Z923 Personal history of irradiation: Secondary | ICD-10-CM | POA: Insufficient documentation

## 2015-09-01 DIAGNOSIS — C50919 Malignant neoplasm of unspecified site of unspecified female breast: Secondary | ICD-10-CM

## 2015-09-01 HISTORY — DX: Intraductal carcinoma in situ of right breast: D05.11

## 2015-09-01 LAB — CBC WITH DIFFERENTIAL/PLATELET
BASOS ABS: 0.1 10*3/uL (ref 0–0.1)
BASOS PCT: 1 %
EOS ABS: 0.2 10*3/uL (ref 0–0.7)
EOS PCT: 2 %
HCT: 36.8 % (ref 35.0–47.0)
Hemoglobin: 12.4 g/dL (ref 12.0–16.0)
LYMPHS PCT: 20 %
Lymphs Abs: 1.6 10*3/uL (ref 1.0–3.6)
MCH: 32.8 pg (ref 26.0–34.0)
MCHC: 33.8 g/dL (ref 32.0–36.0)
MCV: 96.8 fL (ref 80.0–100.0)
Monocytes Absolute: 1 10*3/uL — ABNORMAL HIGH (ref 0.2–0.9)
Monocytes Relative: 12 %
Neutro Abs: 5.4 10*3/uL (ref 1.4–6.5)
Neutrophils Relative %: 65 %
PLATELETS: 232 10*3/uL (ref 150–440)
RBC: 3.8 MIL/uL (ref 3.80–5.20)
RDW: 13.7 % (ref 11.5–14.5)
WBC: 8.3 10*3/uL (ref 3.6–11.0)

## 2015-09-01 LAB — COMPREHENSIVE METABOLIC PANEL
ALBUMIN: 3.6 g/dL (ref 3.5–5.0)
ALK PHOS: 50 U/L (ref 38–126)
ALT: 45 U/L (ref 14–54)
ANION GAP: 6 (ref 5–15)
AST: 48 U/L — ABNORMAL HIGH (ref 15–41)
BILIRUBIN TOTAL: 0.7 mg/dL (ref 0.3–1.2)
BUN: 34 mg/dL — AB (ref 6–20)
CALCIUM: 8.8 mg/dL — AB (ref 8.9–10.3)
CO2: 26 mmol/L (ref 22–32)
CREATININE: 1.76 mg/dL — AB (ref 0.44–1.00)
Chloride: 106 mmol/L (ref 101–111)
GFR calc Af Amer: 30 mL/min — ABNORMAL LOW (ref >60)
GFR calc non Af Amer: 26 mL/min — ABNORMAL LOW (ref >60)
GLUCOSE: 98 mg/dL (ref 65–99)
Potassium: 4.6 mmol/L (ref 3.5–5.1)
SODIUM: 138 mmol/L (ref 135–145)
TOTAL PROTEIN: 6.8 g/dL (ref 6.5–8.1)

## 2015-09-01 NOTE — Progress Notes (Signed)
F/U visit today for R Breast Cancer. It has been 14 months since last visit. She is taking arimidex daily. Pt has been having a lot of joint pain. Arthritis she believes. She is going to have an injection in her back for pain next week at [facility]. Hx AFib and CHF but no current cardiac issues are bothering her. Ambulatory with a cane. Appetite fair. Up and down. Energy stays lower. No dypnea.

## 2015-09-01 NOTE — Progress Notes (Signed)
[place]  [phone]  [phone]) [phone]     [name] DOB: 05/12/1932  MR#: [id number]  JQB#:[id number]  Patient Care Team: Rusty Aus, MD as PCP - General (Unknown Physician Specialty)  CHIEF COMPLAINT:  Chief Complaint  Patient presents with  . Follow-up    breast Cancer    INTERVAL HISTORY:  Patient is here for further evaluation and treatment consideration regarding carcinoma of the right breast. 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 It was decided at that time that she may benefit from some anti-hormone therapy and since has been on anastrozole. Patient reports that she has been having increasing arthritic pain in joints including back, knees, and hands. She is asking if she can discontinue anastrozole to see if this improves. Patient has not had a mammogram since April 2016. Mammogram at that time was reported as BI-RADS 1, negative. Otherwise patient reports feeling fairly well and continues to follow with her primary care provider, Dr. Sabra Heck.  REVIEW OF SYSTEMS:   Review of Systems  Constitutional: Positive for malaise/fatigue. Negative for fever, chills, weight loss and diaphoresis.  HENT: Negative.   Eyes: Negative.   Respiratory: Negative for cough, hemoptysis, sputum production, shortness of breath and wheezing.   Cardiovascular: Negative for chest pain, palpitations, orthopnea, claudication, leg swelling and PND.  Gastrointestinal: Negative for heartburn, nausea, vomiting, abdominal pain, diarrhea, constipation, blood in stool and melena.  Genitourinary: Negative.   Musculoskeletal: Positive for joint pain.  Skin: Negative.   Neurological: Negative for dizziness, tingling, focal weakness, seizures and weakness.  Endo/Heme/Allergies: Does not bruise/bleed easily.  Psychiatric/Behavioral: Negative for depression. The patient is not nervous/anxious and does not have insomnia.       As per HPI. Otherwise, a complete review of systems is negatve.  ONCOLOGY HISTORY: Carcinoma of right breast, stage 0 DCIS, status post radiation therapy from 2007. Patient completed tamoxifen therapy from 2007 to May 2012. Second primary carcinoma of right breast, stage 0 DCIS status post right breast mastectomy in September 2013. ER positive. Started anastrozole from October 2013 and completed in May 2017.  PAST MEDICAL HISTORY: Past Medical History  Diagnosis Date  . Hypertension   . Hyperlipidemia   . Hypothyroid   . Atrial fibrillation ([place])     Echocardiogram 5/12: normal LV function, moderate LAE, mild RAE, mild AI and MR and mild-to-moderate TR.  Myoview 5/ 12 showed an ejection fraction of 73% and normal perfusion.   Patient underwent elective DCCV on 08/26/10  . 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    PAST SURGICAL HISTORY: Past Surgical History  Procedure Laterality Date  . Abdominal hysterectomy    . Back surgery    . Total knee arthroplasty    . Tmj arthroplasty    . Tonsillectomy    . Appendectomy    . Cardioversion  11/10/2011    Procedure: CARDIOVERSION;  Surgeon: Lelon Perla, MD;  Location: [facility] OR;  Service: Cardiovascular;  Laterality: N/A;    FAMILY HISTORY Family History  Problem Relation Age of Onset  . Coronary artery disease Sister 75    MI  . Heart attack Father     GYNECOLOGIC HISTORY:  No LMP recorded. Patient is postmenopausal.     ADVANCED DIRECTIVES:    HEALTH MAINTENANCE: Social History  Substance Use Topics  . Smoking status: Never Smoker   . Smokeless tobacco: Not on file  .  Alcohol Use: No     Mammogram:April 2016  Allergies  Allergen Reactions  . Amlodipine Nausea And Vomiting  . Ciprofloxacin Nausea Only  . Doxycycline Itching and Other (See Comments)    shaky  . Lipitor [Atorvastatin]     Joint pain   . Macrobid [Nitrofurantoin Macrocrystal] Rash  . Penicillins Rash  .  Sulfamethoxazole-Trimethoprim Rash    Current Outpatient Prescriptions  Medication Sig Dispense Refill  . amiodarone (PACERONE) 200 MG tablet Take 1 tablet (200 mg total) by mouth daily. 90 tablet 3  . anastrozole (ARIMIDEX) 1 MG tablet Take 1 mg by mouth Daily.     Marland Kitchen aspirin EC 81 MG tablet Take 1 tablet (81 mg total) by mouth daily. 90 tablet 3  . calcium-vitamin D (OSCAL WITH D) 500-200 MG-UNIT per tablet Take 1 tablet by mouth daily with breakfast.    . Cholecalciferol (VITAMIN D-3 PO) Take 1 tablet by mouth daily.    Marland Kitchen docusate sodium (COLACE) 100 MG capsule Take 100 mg by mouth daily as needed for mild constipation.    . Iron-Vitamin C 65-125 MG TABS Take 1 tablet by mouth daily.    Marland Kitchen lisinopril (PRINIVIL,ZESTRIL) 10 MG tablet Take 10 mg by mouth daily.    . metoprolol (LOPRESSOR) 100 MG tablet Take 0.5 tablets (50 mg total) by mouth 2 (two) times daily. (Patient taking differently: Take 50 mg by mouth daily. )    . Multiple Vitamin (MULTIVITAMIN) tablet Take 1 tablet by mouth daily.      Marland Kitchen omeprazole (PRILOSEC) 40 MG capsule Take 40 mg by mouth daily.    . potassium chloride (KLOR-CON) 10 MEQ CR tablet Take 20 mEq by mouth daily. PT TAKES TWO EXTRA TABLETS WITH EXTRA FLUID PILL    . pravastatin (PRAVACHOL) 40 MG tablet Take 40 mg by mouth at bedtime.     Marland Kitchen spironolactone (ALDACTONE) 25 MG tablet Take 25 mg by mouth daily.    Marland Kitchen torsemide (DEMADEX) 20 MG tablet Take 2 tablets (40 mg total) by mouth daily. (Patient taking differently: Take 20 mg by mouth daily. ) 60 tablet 3  . venlafaxine (EFFEXOR-XR) 75 MG 24 hr capsule Take 75 mg by mouth daily.      . Ascorbic Acid (VITAMIN C PO) Take 1 tablet by mouth daily. Reported on 09/01/2015    . levothyroxine (SYNTHROID, LEVOTHROID) 150 MCG tablet Take 150 mcg by mouth daily.      No current facility-administered medications for this visit.    OBJECTIVE: BP 96/56 mmHg  Pulse 50  Temp(Src) 96 F (35.6 C) (Tympanic)  Wt 147 lb 4.3 oz  (66.8 kg)   Body mass index is 22.4 kg/(m^2).    ECOG FS:1 - Symptomatic but completely ambulatory  General: Well-developed, well-nourished, no acute distress. Eyes: Pink conjunctiva, anicteric sclera. HEENT: Normocephalic, moist mucous membranes, clear oropharnyx. Lungs: Clear to auscultation bilaterally. Heart: Regular rate and rhythm. No rubs, murmurs, or gallops. Abdomen: Soft, nontender, nondistended. No organomegaly noted, normoactive bowel sounds. Breast: Right mastectomy, chest wall free of masses. Left breast palpated in a circular manner in the sitting and supine positions.  No masses or fullness palpated.  Axilla palpated in both positions with no masses or fullness palpated.  Musculoskeletal: No edema, cyanosis, or clubbing. Some deformity and edema in the small joints of the hands as well as knees. Neuro: Alert, answering all questions appropriately. Cranial nerves grossly intact. Skin: No rashes or petechiae noted. Psych: Normal affect. Lymphatics: No cervical, clavicular, or axillary  LAD.   LAB RESULTS:  Appointment on 09/01/2015  Component Date Value Ref Range Status  . WBC 09/01/2015 8.3  3.6 - 11.0 K/uL Final  . RBC 09/01/2015 3.80  3.80 - 5.20 MIL/uL Final  . Hemoglobin 09/01/2015 12.4  12.0 - 16.0 g/dL Final  . HCT 09/01/2015 36.8  35.0 - 47.0 % Final  . MCV 09/01/2015 96.8  80.0 - 100.0 fL Final  . MCH 09/01/2015 32.8  26.0 - 34.0 pg Final  . MCHC 09/01/2015 33.8  32.0 - 36.0 g/dL Final  . RDW 09/01/2015 13.7  11.5 - 14.5 % Final  . Platelets 09/01/2015 232  150 - 440 K/uL Final  . Neutrophils Relative % 09/01/2015 65   Final  . Neutro Abs 09/01/2015 5.4  1.4 - 6.5 K/uL Final  . Lymphocytes Relative 09/01/2015 20   Final  . Lymphs Abs 09/01/2015 1.6  1.0 - 3.6 K/uL Final  . Monocytes Relative 09/01/2015 12   Final  . Monocytes Absolute 09/01/2015 1.0* 0.2 - 0.9 K/uL Final  . Eosinophils Relative 09/01/2015 2   Final  . Eosinophils Absolute 09/01/2015 0.2  0 -  0.7 K/uL Final  . Basophils Relative 09/01/2015 1   Final  . Basophils Absolute 09/01/2015 0.1  0 - 0.1 K/uL Final  . Sodium 09/01/2015 138  135 - 145 mmol/L Final  . Potassium 09/01/2015 4.6  3.5 - 5.1 mmol/L Final  . Chloride 09/01/2015 106  101 - 111 mmol/L Final  . CO2 09/01/2015 26  22 - 32 mmol/L Final  . Glucose, Bld 09/01/2015 98  65 - 99 mg/dL Final  . BUN 09/01/2015 34* 6 - 20 mg/dL Final  . Creatinine, Ser 09/01/2015 1.76* 0.44 - 1.00 mg/dL Final  . Calcium 09/01/2015 8.8* 8.9 - 10.3 mg/dL Final  . Total Protein 09/01/2015 6.8  6.5 - 8.1 g/dL Final  . Albumin 09/01/2015 3.6  3.5 - 5.0 g/dL Final  . AST 09/01/2015 48* 15 - 41 U/L Final  . ALT 09/01/2015 45  14 - 54 U/L Final  . Alkaline Phosphatase 09/01/2015 50  38 - 126 U/L Final  . Total Bilirubin 09/01/2015 0.7  0.3 - 1.2 mg/dL Final  . GFR calc non Af Amer 09/01/2015 26* >60 mL/min Final  . GFR calc Af Amer 09/01/2015 30* >60 mL/min Final   Comment: (NOTE) The eGFR has been calculated using the CKD EPI equation. This calculation has not been validated in all clinical situations. eGFR's persistently <60 mL/min signify possible Chronic Kidney Disease.   . Anion gap 09/01/2015 6  5 - 15 Final  Office Visit on 08/30/2015  Component Date Value Ref Range Status  . Glucose 08/30/2015 60* 65 - 99 mg/dL Final  . BUN 08/30/2015 29* 8 - 27 mg/dL Final  . Creatinine, Ser 08/30/2015 1.63* 0.57 - 1.00 mg/dL Final  . GFR calc non Af Amer 08/30/2015 30* >59 mL/min/1.73 Final  . GFR calc Af Amer 08/30/2015 34* >59 mL/min/1.73 Final  . BUN/Creatinine Ratio 08/30/2015 18  12 - 28 Final  . Sodium 08/30/2015 142  134 - 144 mmol/L Final  . Potassium 08/30/2015 4.9  3.5 - 5.2 mmol/L Final  . Chloride 08/30/2015 102  96 - 106 mmol/L Final  . CO2 08/30/2015 21  18 - 29 mmol/L Final  . Calcium 08/30/2015 8.7  8.7 - 10.3 mg/dL Final  . INR 08/30/2015 1.0  0.8 - 1.2 Final   Comment: Reference interval is for non-anticoagulated  patients. Suggested INR therapeutic range  for Vitamin K antagonist therapy:    Standard Dose (moderate intensity                   therapeutic range):       2.0 - 3.0    Higher intensity therapeutic range       2.5 - 3.5   . Prothrombin Time 08/30/2015 10.5  9.1 - 12.0 sec Final  . WBC 08/30/2015 WILL FOLLOW   Preliminary  . RBC 08/30/2015 WILL FOLLOW   Preliminary  . Hemoglobin 08/30/2015 WILL FOLLOW   Preliminary  . Hematocrit 08/30/2015 WILL FOLLOW   Preliminary  . MCV 08/30/2015 WILL FOLLOW   Preliminary  . [facility] 08/30/2015 WILL FOLLOW   Preliminary  . MCHC 08/30/2015 WILL FOLLOW   Preliminary  . RDW 08/30/2015 WILL FOLLOW   Preliminary  . Platelets 08/30/2015 WILL FOLLOW   Preliminary  . Neutrophils 08/30/2015 WILL FOLLOW   Preliminary  . Lymphs 08/30/2015 WILL FOLLOW   Preliminary  . Monocytes 08/30/2015 WILL FOLLOW   Preliminary  . Eos 08/30/2015 WILL FOLLOW   Preliminary  . Basos 08/30/2015 WILL FOLLOW   Preliminary  . Neutrophils Absolute 08/30/2015 WILL FOLLOW   Preliminary  . Lymphocytes Absolute 08/30/2015 WILL FOLLOW   Preliminary  . Monocytes Absolute 08/30/2015 WILL FOLLOW   Preliminary  . EOS (ABSOLUTE) 08/30/2015 WILL FOLLOW   Preliminary  . Basophils Absolute 08/30/2015 WILL FOLLOW   Preliminary  . Immature Granulocytes 08/30/2015 WILL FOLLOW   Preliminary  . Immature Grans (Abs) 08/30/2015 WILL FOLLOW   Preliminary    STUDIES: No results found.  ASSESSMENT:  DCIS of right breast, stage 0, Tis N0 M0, diagnosed in 2007. DCIS of right breast, stage 0, Tis N0 M0, diagnosed in 2013, ER positive.  PLAN:   1. DCIS of right breast. Initial diagnosis was in 2007, patient completed 5 years of tamoxifen therapy finishing in May 2012. Upon second diagnosis of DCIS patient underwent right breast mastectomy in September 2013. At that time patient was started on anastrozole due to ER-positive.  Patient has been noticing increasing pain in joints related to some arthritic  changes but is interested in stopping anastrozole. Advised patient that we could discontinue anastrozole at this time. Clinically there is no evidence of recurrent or progressive disease. Patient is to schedule follow-up mammogram as she is due and also needs to schedule a bone density exam.  We will continue with routine follow-up in approximately one year pending mammogram and DEXA scan are within normal limits.  Patient expressed understanding and was in agreement with this plan. She also understands that She can call clinic at any time with any questions, concerns, or complaints.   Dr. Oliva Bustard was available for consultation and review of plan of care for this patient.  Evlyn Kanner, NP   09/01/2015 3:42 PM

## 2015-09-02 ENCOUNTER — Ambulatory Visit
Admission: RE | Admit: 2015-09-02 | Discharge: 2015-09-02 | Disposition: A | Discharge status: Home / Self Care | Payer: PPO | Source: Ambulatory Visit | Attending: Physical Medicine and Rehabilitation | Admitting: Physical Medicine and Rehabilitation

## 2015-09-02 ENCOUNTER — Inpatient Hospital Stay: Payer: PPO

## 2015-09-02 ENCOUNTER — Inpatient Hospital Stay: Payer: PPO | Admitting: Family Medicine

## 2015-09-02 DIAGNOSIS — M419 Scoliosis, unspecified: Secondary | ICD-10-CM | POA: Diagnosis not present

## 2015-09-02 DIAGNOSIS — M5136 Other intervertebral disc degeneration, lumbar region: Secondary | ICD-10-CM

## 2015-09-02 DIAGNOSIS — M5416 Radiculopathy, lumbar region: Secondary | ICD-10-CM | POA: Insufficient documentation

## 2015-09-02 DIAGNOSIS — M4806 Spinal stenosis, lumbar region: Secondary | ICD-10-CM | POA: Insufficient documentation

## 2015-09-02 DIAGNOSIS — M5126 Other intervertebral disc displacement, lumbar region: Secondary | ICD-10-CM | POA: Diagnosis not present

## 2015-09-02 DIAGNOSIS — M48062 Spinal stenosis, lumbar region with neurogenic claudication: Secondary | ICD-10-CM

## 2015-09-02 DIAGNOSIS — M5137 Other intervertebral disc degeneration, lumbosacral region: Secondary | ICD-10-CM | POA: Diagnosis not present

## 2015-09-08 ENCOUNTER — Encounter (HOSPITAL_COMMUNITY): Payer: Self-pay | Admitting: Cardiovascular Disease

## 2015-09-08 ENCOUNTER — Ambulatory Visit (HOSPITAL_COMMUNITY)
Admission: RE | Admit: 2015-09-08 | Discharge: 2015-09-09 | Disposition: A | Discharge status: Home / Self Care | Payer: PPO | Source: Ambulatory Visit | Attending: Cardiovascular Disease | Admitting: Cardiovascular Disease

## 2015-09-08 ENCOUNTER — Encounter (HOSPITAL_COMMUNITY)
Admission: RE | Disposition: A | Discharge status: Home / Self Care | Payer: Self-pay | Source: Ambulatory Visit | Attending: Cardiovascular Disease

## 2015-09-08 DIAGNOSIS — I70233 Atherosclerosis of native arteries of right leg with ulceration of ankle: Secondary | ICD-10-CM | POA: Diagnosis not present

## 2015-09-08 DIAGNOSIS — I481 Persistent atrial fibrillation: Secondary | ICD-10-CM | POA: Diagnosis not present

## 2015-09-08 DIAGNOSIS — E785 Hyperlipidemia, unspecified: Secondary | ICD-10-CM | POA: Diagnosis not present

## 2015-09-08 DIAGNOSIS — N189 Chronic kidney disease, unspecified: Secondary | ICD-10-CM | POA: Insufficient documentation

## 2015-09-08 DIAGNOSIS — R0602 Shortness of breath: Secondary | ICD-10-CM

## 2015-09-08 DIAGNOSIS — Z7982 Long term (current) use of aspirin: Secondary | ICD-10-CM | POA: Diagnosis not present

## 2015-09-08 DIAGNOSIS — I10 Essential (primary) hypertension: Secondary | ICD-10-CM

## 2015-09-08 DIAGNOSIS — I5032 Chronic diastolic (congestive) heart failure: Secondary | ICD-10-CM | POA: Insufficient documentation

## 2015-09-08 DIAGNOSIS — I739 Peripheral vascular disease, unspecified: Secondary | ICD-10-CM | POA: Diagnosis present

## 2015-09-08 DIAGNOSIS — E039 Hypothyroidism, unspecified: Secondary | ICD-10-CM | POA: Diagnosis not present

## 2015-09-08 DIAGNOSIS — I480 Paroxysmal atrial fibrillation: Secondary | ICD-10-CM

## 2015-09-08 DIAGNOSIS — L97319 Non-pressure chronic ulcer of right ankle with unspecified severity: Secondary | ICD-10-CM | POA: Insufficient documentation

## 2015-09-08 DIAGNOSIS — Z880 Allergy status to penicillin: Secondary | ICD-10-CM | POA: Diagnosis not present

## 2015-09-08 DIAGNOSIS — N289 Disorder of kidney and ureter, unspecified: Secondary | ICD-10-CM

## 2015-09-08 DIAGNOSIS — I70238 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: Secondary | ICD-10-CM | POA: Diagnosis not present

## 2015-09-08 HISTORY — DX: Unspecified osteoarthritis, unspecified site: M19.90

## 2015-09-08 HISTORY — DX: Gastro-esophageal reflux disease without esophagitis: K21.9

## 2015-09-08 HISTORY — DX: Chronic obstructive pulmonary disease, unspecified: J44.9

## 2015-09-08 HISTORY — DX: Malignant neoplasm of unspecified site of right female breast: C50.911

## 2015-09-08 HISTORY — DX: Low back pain, unspecified: M54.50

## 2015-09-08 HISTORY — DX: Peripheral vascular disease, unspecified: I73.9

## 2015-09-08 HISTORY — PX: PERIPHERAL VASCULAR CATHETERIZATION: SHX172C

## 2015-09-08 HISTORY — DX: Low back pain: M54.5

## 2015-09-08 HISTORY — PX: BALLOON ANGIOPLASTY, ARTERY: SHX564

## 2015-09-08 HISTORY — DX: Other complications of anesthesia, initial encounter: T88.59XA

## 2015-09-08 HISTORY — DX: Other chronic pain: G89.29

## 2015-09-08 HISTORY — DX: Adverse effect of unspecified anesthetic, initial encounter: T41.45XA

## 2015-09-08 LAB — POCT ACTIVATED CLOTTING TIME
Activated Clotting Time: 183 seconds
Activated Clotting Time: 229 seconds
Activated Clotting Time: 183 seconds
Activated Clotting Time: 162 seconds

## 2015-09-08 SURGERY — ABDOMINAL AORTOGRAM W/LOWER EXTREMITY

## 2015-09-08 MED ORDER — CLOPIDOGREL BISULFATE 300 MG PO TABS
ORAL_TABLET | ORAL | Status: DC | PRN
  Administered 2015-09-08: 600 mg via ORAL

## 2015-09-08 MED ORDER — HYDRALAZINE HCL 20 MG/ML IJ SOLN
10.0000 mg | INTRAMUSCULAR | Status: DC | PRN
  Administered 2015-09-08: 10 mg via INTRAVENOUS
  Filled 2015-09-08 (×2): qty 1

## 2015-09-08 MED ORDER — CLOPIDOGREL BISULFATE 75 MG PO TABS
75.0000 mg | ORAL_TABLET | Freq: Every day | ORAL | Status: DC
  Administered 2015-09-09: 75 mg via ORAL
  Filled 2015-09-08: qty 1

## 2015-09-08 MED ORDER — SODIUM CHLORIDE 0.9 % IV SOLN
INTRAVENOUS | Status: DC
  Administered 2015-09-08: 08:00:00 via INTRAVENOUS

## 2015-09-08 MED ORDER — LEVOTHYROXINE SODIUM 75 MCG PO TABS
150.0000 ug | ORAL_TABLET | Freq: Every day | ORAL | Status: DC
  Administered 2015-09-09: 06:00:00 150 ug via ORAL
  Filled 2015-09-08: qty 2

## 2015-09-08 MED ORDER — HEPARIN (PORCINE) IN NACL 2-0.9 UNIT/ML-% IJ SOLN
INTRAMUSCULAR | Status: DC | PRN
  Administered 2015-09-08: 12 mL

## 2015-09-08 MED ORDER — ANGIOPLASTY BOOK
Freq: Once | Status: AC
  Administered 2015-09-08: 21:00:00
  Filled 2015-09-08: qty 1

## 2015-09-08 MED ORDER — SPIRONOLACTONE 25 MG PO TABS
25.0000 mg | ORAL_TABLET | Freq: Every day | ORAL | Status: DC
  Administered 2015-09-08 – 2015-09-09 (×2): 25 mg via ORAL
  Filled 2015-09-08 (×2): qty 1

## 2015-09-08 MED ORDER — SODIUM CHLORIDE 0.9% FLUSH: 3.0000 mL | INTRAVENOUS | Status: DC | PRN

## 2015-09-08 MED ORDER — HYDRALAZINE HCL 20 MG/ML IJ SOLN
INTRAMUSCULAR | Status: AC
  Filled 2015-09-08: qty 1

## 2015-09-08 MED ORDER — TEMAZEPAM 15 MG PO CAPS: 15.0000 mg | ORAL_CAPSULE | Freq: Every evening | ORAL | Status: DC | PRN

## 2015-09-08 MED ORDER — SODIUM CHLORIDE 0.9 % IV SOLN: 250.0000 mL | INTRAVENOUS | Status: DC | PRN

## 2015-09-08 MED ORDER — ASPIRIN 81 MG PO CHEW
81.0000 mg | CHEWABLE_TABLET | ORAL | Status: AC
  Administered 2015-09-08: 81 mg via ORAL

## 2015-09-08 MED ORDER — HEPARIN SODIUM (PORCINE) 1000 UNIT/ML IJ SOLN
INTRAMUSCULAR | Status: DC | PRN
  Administered 2015-09-08: 4000 [IU] via INTRAVENOUS
  Administered 2015-09-08: 3000 [IU] via INTRAVENOUS

## 2015-09-08 MED ORDER — ADULT MULTIVITAMIN W/MINERALS CH
1.0000 | ORAL_TABLET | Freq: Every day | ORAL | Status: DC
  Administered 2015-09-08 – 2015-09-09 (×2): 1 via ORAL
  Filled 2015-09-08 (×2): qty 1

## 2015-09-08 MED ORDER — NITROGLYCERIN 1 MG/10 ML FOR IR/CATH LAB
INTRA_ARTERIAL | Status: AC
  Filled 2015-09-08: qty 10

## 2015-09-08 MED ORDER — HEPARIN SODIUM (PORCINE) 1000 UNIT/ML IJ SOLN
INTRAMUSCULAR | Status: AC
  Filled 2015-09-08: qty 1

## 2015-09-08 MED ORDER — CYCLOBENZAPRINE HCL 10 MG PO TABS
5.0000 mg | ORAL_TABLET | Freq: Three times a day (TID) | ORAL | Status: DC | PRN
  Administered 2015-09-08: 5 mg via ORAL
  Filled 2015-09-08: qty 1

## 2015-09-08 MED ORDER — ACETAMINOPHEN 325 MG PO TABS
650.0000 mg | ORAL_TABLET | ORAL | Status: DC | PRN
  Administered 2015-09-08 – 2015-09-09 (×2): 650 mg via ORAL
  Filled 2015-09-08 (×2): qty 2

## 2015-09-08 MED ORDER — PRAVASTATIN SODIUM 40 MG PO TABS
40.0000 mg | ORAL_TABLET | Freq: Every day | ORAL | Status: DC
  Administered 2015-09-08: 40 mg via ORAL
  Filled 2015-09-08: qty 1

## 2015-09-08 MED ORDER — IRON-VITAMIN C 65-125 MG PO TABS: 1.0000 | ORAL_TABLET | Freq: Every day | ORAL | Status: DC

## 2015-09-08 MED ORDER — HEPARIN (PORCINE) IN NACL 2-0.9 UNIT/ML-% IJ SOLN
INTRAMUSCULAR | Status: AC
  Filled 2015-09-08: qty 1000

## 2015-09-08 MED ORDER — ASPIRIN EC 81 MG PO TBEC
81.0000 mg | DELAYED_RELEASE_TABLET | Freq: Every day | ORAL | Status: DC
  Administered 2015-09-09: 81 mg via ORAL
  Filled 2015-09-08: qty 1

## 2015-09-08 MED ORDER — SODIUM CHLORIDE 0.9% FLUSH: 3.0000 mL | Freq: Two times a day (BID) | INTRAVENOUS | Status: DC

## 2015-09-08 MED ORDER — CLOPIDOGREL BISULFATE 300 MG PO TABS
ORAL_TABLET | ORAL | Status: AC
  Filled 2015-09-08: qty 2

## 2015-09-08 MED ORDER — SODIUM CHLORIDE 0.9 % IV SOLN
INTRAVENOUS | Status: AC
  Administered 2015-09-08: 13:00:00 via INTRAVENOUS

## 2015-09-08 MED ORDER — PANTOPRAZOLE SODIUM 40 MG PO TBEC
40.0000 mg | DELAYED_RELEASE_TABLET | Freq: Every day | ORAL | Status: DC
  Administered 2015-09-08 – 2015-09-09 (×3): 40 mg via ORAL
  Filled 2015-09-08 (×3): qty 1

## 2015-09-08 MED ORDER — ONDANSETRON HCL 4 MG/2ML IJ SOLN
4.0000 mg | Freq: Four times a day (QID) | INTRAMUSCULAR | Status: DC | PRN
  Administered 2015-09-08: 4 mg via INTRAVENOUS
  Filled 2015-09-08: qty 2

## 2015-09-08 MED ORDER — MIDAZOLAM HCL 2 MG/2ML IJ SOLN
INTRAMUSCULAR | Status: AC
  Filled 2015-09-08: qty 2

## 2015-09-08 MED ORDER — MIDAZOLAM HCL 2 MG/2ML IJ SOLN
INTRAMUSCULAR | Status: DC | PRN
  Administered 2015-09-08 (×2): 1 mg via INTRAVENOUS

## 2015-09-08 MED ORDER — LISINOPRIL 10 MG PO TABS
10.0000 mg | ORAL_TABLET | Freq: Every day | ORAL | Status: DC
  Administered 2015-09-08 – 2015-09-09 (×2): 10 mg via ORAL
  Filled 2015-09-08 (×2): qty 1

## 2015-09-08 MED ORDER — FENTANYL CITRATE (PF) 100 MCG/2ML IJ SOLN
INTRAMUSCULAR | Status: AC
  Filled 2015-09-08: qty 2

## 2015-09-08 MED ORDER — NITROGLYCERIN 1 MG/10 ML FOR IR/CATH LAB
INTRA_ARTERIAL | Status: DC | PRN
  Administered 2015-09-08: 200 ug via INTRA_ARTERIAL

## 2015-09-08 MED ORDER — VENLAFAXINE HCL ER 75 MG PO CP24
75.0000 mg | ORAL_CAPSULE | Freq: Every day | ORAL | Status: DC
  Administered 2015-09-08 – 2015-09-09 (×2): 75 mg via ORAL
  Filled 2015-09-08 (×2): qty 1

## 2015-09-08 MED ORDER — ACETAMINOPHEN 500 MG PO TABS: 500.0000 mg | ORAL_TABLET | Freq: Four times a day (QID) | ORAL | Status: DC | PRN

## 2015-09-08 MED ORDER — TORSEMIDE 20 MG PO TABS
20.0000 mg | ORAL_TABLET | Freq: Every day | ORAL | Status: DC
  Administered 2015-09-09: 20 mg via ORAL
  Filled 2015-09-08 (×2): qty 1

## 2015-09-08 MED ORDER — ASPIRIN 81 MG PO CHEW
CHEWABLE_TABLET | ORAL | Status: AC
  Administered 2015-09-08: 81 mg via ORAL
  Filled 2015-09-08: qty 1

## 2015-09-08 MED ORDER — MORPHINE SULFATE (PF) 2 MG/ML IV SOLN
2.0000 mg | INTRAVENOUS | Status: DC | PRN
  Administered 2015-09-08: 14:00:00 2 mg via INTRAVENOUS
  Filled 2015-09-08: qty 1

## 2015-09-08 MED ORDER — LIDOCAINE HCL (PF) 1 % IJ SOLN
INTRAMUSCULAR | Status: AC
  Filled 2015-09-08: qty 30

## 2015-09-08 MED ORDER — HYDRALAZINE HCL 20 MG/ML IJ SOLN
INTRAMUSCULAR | Status: DC | PRN
  Administered 2015-09-08 (×2): 10 mg via INTRAVENOUS
  Administered 2015-09-08: 20 mg via INTRAVENOUS

## 2015-09-08 MED ORDER — POTASSIUM CHLORIDE CRYS ER 10 MEQ PO TBCR
10.0000 meq | EXTENDED_RELEASE_TABLET | Freq: Every day | ORAL | Status: DC
  Administered 2015-09-08 – 2015-09-09 (×2): 10 meq via ORAL
  Filled 2015-09-08 (×2): qty 1

## 2015-09-08 MED ORDER — METOPROLOL TARTRATE 25 MG PO TABS
50.0000 mg | ORAL_TABLET | Freq: Two times a day (BID) | ORAL | Status: DC
  Administered 2015-09-08 – 2015-09-09 (×3): 50 mg via ORAL
  Filled 2015-09-08 (×3): qty 2

## 2015-09-08 MED ORDER — DOCUSATE SODIUM 100 MG PO CAPS: 100.0000 mg | ORAL_CAPSULE | Freq: Every day | ORAL | Status: DC | PRN

## 2015-09-08 MED ORDER — CALCIUM CARBONATE-VITAMIN D 500-200 MG-UNIT PO TABS
1.0000 | ORAL_TABLET | Freq: Every day | ORAL | Status: DC
  Administered 2015-09-08 – 2015-09-09 (×2): 1 via ORAL
  Filled 2015-09-08 (×2): qty 1

## 2015-09-08 MED ORDER — FE FUMARATE-B12-VIT C-FA-IFC PO CAPS
1.0000 | ORAL_CAPSULE | Freq: Every day | ORAL | Status: DC
  Administered 2015-09-08 – 2015-09-09 (×2): 1 via ORAL
  Filled 2015-09-08 (×2): qty 1

## 2015-09-08 MED ORDER — AMIODARONE HCL 200 MG PO TABS
200.0000 mg | ORAL_TABLET | Freq: Every day | ORAL | Status: DC
  Administered 2015-09-08 – 2015-09-09 (×2): 200 mg via ORAL
  Filled 2015-09-08 (×2): qty 1

## 2015-09-08 MED ORDER — FENTANYL CITRATE (PF) 100 MCG/2ML IJ SOLN
INTRAMUSCULAR | Status: DC | PRN
  Administered 2015-09-08: 25 ug via INTRAVENOUS

## 2015-09-08 SURGICAL SUPPLY — 22 items
BALLN ARMADA 3.0X60X150 (BALLOONS) ×3
BALLN COYOTE OTW 4X60X150 (BALLOONS) ×3
BALLN LUTONIX DCB 5X80X130 (BALLOONS) ×3
BALLOON ARMADA 3.0X60X150 (BALLOONS) IMPLANT
BALLOON COYOTE OTW 4X60X150 (BALLOONS) IMPLANT
BALLOON LUTONIX DCB 5X80X130 (BALLOONS) IMPLANT
CATH ANGIO 5F PIGTAIL 65CM (CATHETERS) IMPLANT
CATH CROSS OVER TEMPO 5F (CATHETERS) ×1 IMPLANT
CATH QUICKCROSS .018X135CM (MICROCATHETER) ×1 IMPLANT
CATH STRAIGHT 5FR 65CM (CATHETERS) ×1 IMPLANT
KIT ENCORE 26 ADVANTAGE (KITS) ×2 IMPLANT
KIT PV (KITS) ×3 IMPLANT
SHEATH HIGHFLEX ANSEL 6FRX55 (SHEATH) ×1 IMPLANT
SHEATH PINNACLE 5F 10CM (SHEATH) ×1 IMPLANT
STOPCOCK MORSE 400PSI 3WAY (MISCELLANEOUS) ×1 IMPLANT
SYR MEDRAD MARK V 150ML (SYRINGE) ×3 IMPLANT
TRANSDUCER W/STOPCOCK (MISCELLANEOUS) ×3 IMPLANT
TRAY PV CATH (CUSTOM PROCEDURE TRAY) ×3 IMPLANT
TUBING CIL FLEX 10 FLL-RA (TUBING) ×1 IMPLANT
WIRE ASAHI FIELDER XT 300CM (WIRE) ×1 IMPLANT
WIRE HITORQ VERSACORE ST 145CM (WIRE) ×1 IMPLANT
WIRE SPARTACORE .014X300CM (WIRE) ×1 IMPLANT

## 2015-09-08 NOTE — H&P (View-Only) (Signed)
Cardiology Office Note   Date:  08/30/2015   ID:  Pricilla Holm Mcquaid, DOB [birth year], MRN [id number]  PCP:  Rusty Aus, MD  Cardiologist:   Kathlyn Sacramento, MD   Chief Complaint  Patient presents with  . other    6 month f/u c/o leg weakness. Meds reviewed verbally.      History of Present Illness: [name] is a 82 y.o. female who presents for peripheral arterial disease and atrial fibrillation.  She had previous previous cardioversion in 2012. She has been maintaining in sinus rhythm with amiodarone. She has no history of coronary artery disease with previous nuclear stress test in 2012. Anticoagulation with warfarin was stopped due to frequent falls. She has chronic kidney disease with chronic leg edema. She was seen in April, 2016 for bilateral leg pain. She had previous right knee replacements with some improvement in her symptoms. She was noted to have chronic ulceration on the right lateral ankle for few years which has required wound care.   Noninvasive vascular evaluation showed an ABI of 0.68 on the right and 0.85 on the left. Angiography in May, 2016 showed:  1. No significant aortoiliac disease. 2. Moderate nonobstructive disease affecting the distal right SFA and popliteal artery. One-vessel runoff below the knee via the peroneal artery with short occlusion of the anterior tibial artery and reconstitution.  In spite of continued wound care, the patient continues to have the ulceration on the right lateral ankle. Fortunately, currently with minimal oozing. This has persisted in spite of treating her edema. She describes cramps in both thighs at rest and with physical activities.   Past Medical History  Diagnosis Date  . Hypertension   . Hyperlipidemia   . Hypothyroid   . Atrial fibrillation ([place])     Echocardiogram 5/12: normal LV function, moderate LAE, mild RAE, mild AI and MR and mild-to-moderate TR.  Myoview 5/ 12 showed an ejection fraction of 73% and normal  perfusion.   Patient underwent elective DCCV on 08/26/10    Past Surgical History  Procedure Laterality Date  . Abdominal hysterectomy    . Back surgery    . Total knee arthroplasty    . Tmj arthroplasty    . Tonsillectomy    . Appendectomy    . Cardioversion  11/10/2011    Procedure: CARDIOVERSION;  Surgeon: Lelon Perla, MD;  Location: [facility] OR;  Service: Cardiovascular;  Laterality: N/A;     Current Outpatient Prescriptions  Medication Sig Dispense Refill  . amiodarone (PACERONE) 200 MG tablet Take 1 tablet (200 mg total) by mouth daily. 90 tablet 3  . anastrozole (ARIMIDEX) 1 MG tablet Take 1 mg by mouth Daily.     . Ascorbic Acid (VITAMIN C PO) Take 1 tablet by mouth daily.    Marland Kitchen aspirin EC 81 MG tablet Take 1 tablet (81 mg total) by mouth daily. 90 tablet 3  . calcium-vitamin D (OSCAL WITH D) 500-200 MG-UNIT per tablet Take 1 tablet by mouth daily with breakfast.    . Cholecalciferol (VITAMIN D-3 PO) Take 1 tablet by mouth daily.    Marland Kitchen docusate sodium (COLACE) 100 MG capsule Take 100 mg by mouth daily as needed for mild constipation.    . Iron-Vitamin C 65-125 MG TABS Take 1 tablet by mouth daily.    Marland Kitchen levothyroxine (SYNTHROID, LEVOTHROID) 150 MCG tablet Take 150 mcg by mouth daily.     Marland Kitchen lisinopril (PRINIVIL,ZESTRIL) 10 MG tablet Take 10 mg by mouth  daily.    . metoprolol (LOPRESSOR) 100 MG tablet Take 0.5 tablets (50 mg total) by mouth 2 (two) times daily. (Patient taking differently: Take 50 mg by mouth daily. )    . Multiple Vitamin (MULTIVITAMIN) tablet Take 1 tablet by mouth daily.      Marland Kitchen omeprazole (PRILOSEC) 40 MG capsule Take 40 mg by mouth daily.    . potassium chloride (KLOR-CON) 10 MEQ CR tablet Take 20 mEq by mouth daily. PT TAKES TWO EXTRA TABLETS WITH EXTRA FLUID PILL    . pravastatin (PRAVACHOL) 40 MG tablet Take 40 mg by mouth at bedtime.     Marland Kitchen spironolactone (ALDACTONE) 25 MG tablet Take 25 mg by mouth daily.    Marland Kitchen torsemide (DEMADEX) 20 MG tablet Take 2 tablets  (40 mg total) by mouth daily. (Patient taking differently: Take 20 mg by mouth daily. ) 60 tablet 3  . venlafaxine (EFFEXOR-XR) 75 MG 24 hr capsule Take 75 mg by mouth daily.       No current facility-administered medications for this visit.    Allergies:   Amlodipine; Ciprofloxacin; Doxycycline; Lipitor; Macrobid; Penicillins; and Sulfamethoxazole-trimethoprim    Social History:  The patient  reports that she has never smoked. She does not have any smokeless tobacco history on file. She reports that she does not drink alcohol.   Family History:  The patient's family history includes Coronary artery disease (age of onset: 24) in her sister; Heart attack in her father.    ROS:  Please see the history of present illness.   Otherwise, review of systems are positive for none.   All other systems are reviewed and negative.    PHYSICAL EXAM: VS:  BP 124/54 mmHg  Pulse 53  Ht 5\' 8"  (1.727 m)  Wt 145 lb 8 oz (65.998 kg)  BMI 22.13 kg/m2 , BMI Body mass index is 22.13 kg/(m^2). GEN: Well nourished, well developed, in no acute distress HEENT: normal Neck: no JVD, carotid bruits, or masses Cardiac: RRR; no murmurs, rubs, or gallops,no edema  Respiratory:  clear to auscultation bilaterally, normal work of breathing GI: soft, nontender, nondistended, + BS MS: no deformity or atrophy Skin: warm and dry, no rash Neuro:  Strength and sensation are intact Psych: euthymic mood, full affect Femoral pulses are normal. Distal pulses are not palpable.  EKG:  EKG is ordered today. The ekg ordered today demonstrates sinus bradycardia with no significant ST or T wave changes.   Recent Labs: 11/11/2014: BUN 34*; Creatinine, Ser 1.60*; Potassium 4.7; Sodium 143    Lipid Panel No results found for: CHOL, TRIG, HDL, CHOLHDL, VLDL, LDLCALC, LDLDIRECT    Wt Readings from Last 3 Encounters:  08/30/15 145 lb 8 oz (65.998 kg)  03/01/15 152 lb 8 oz (69.174 kg)  10/29/14 149 lb 8 oz (67.813 kg)         ASSESSMENT AND PLAN:  1.  Peripheral arterial disease with continued nonhealing ulcer on the right lateral ankle. This is in the distribution of the anterior tibial artery and I reviewed the angiogram with her from last year. The anterior tibial artery had short occlusion proximally and we should try to revascularize this area in order to improve her chances of healing the ulcer. Thus, I recommend proceeding with repeat angiography with the intention of endovascular intervention on the right anterior tibial artery. I discussed risks and benefits. She does have chronic kidney disease and will gently hydrate her before and after the procedure.  2. Persistent atrial fibrillation:  Currently maintaining in sinus rhythm with amiodarone.   3. Chronic diastolic heart failure: She appears to be euvolemic.  4. Essential hypertension: Blood pressure is controlled on current medications.      Disposition:   FU with me in 1 month  Signed, Kathlyn Sacramento, MD  08/30/2015 1:58 PM    [facility]

## 2015-09-08 NOTE — Interval H&P Note (Signed)
History and Physical Interval Note:  09/08/2015 10:48 AM  [name]  has presented today for surgery, with the diagnosis of pvd  The various methods of treatment have been discussed with the patient and family. After consideration of risks, benefits and other options for treatment, the patient has consented to  Procedure(s): Abdominal Aortogram w/Lower Extremity (N/A) as a surgical intervention .  The patient's history has been reviewed, patient examined, no change in status, stable for surgery.  I have reviewed the patient's chart and labs.  Questions were answered to the patient's satisfaction.     Kathlyn Sacramento

## 2015-09-08 NOTE — Progress Notes (Signed)
Site area: left groin  Site Prior to Removal:  Level 0  Pressure Applied For 20 MINUTES    Minutes Beginning at 1455  Manual:   Yes.    Patient Status During Pull:  stable  Post Pull Groin Site:  Level 0  Post Pull Instructions Given:  Yes.    Post Pull Pulses Present:  Yes.    Dressing Applied:  Yes.    Comments:

## 2015-09-09 ENCOUNTER — Other Ambulatory Visit: Payer: Self-pay | Admitting: Cardiology

## 2015-09-09 DIAGNOSIS — I739 Peripheral vascular disease, unspecified: Secondary | ICD-10-CM

## 2015-09-09 DIAGNOSIS — I70233 Atherosclerosis of native arteries of right leg with ulceration of ankle: Secondary | ICD-10-CM | POA: Diagnosis not present

## 2015-09-09 LAB — BASIC METABOLIC PANEL
Anion gap: 7 (ref 5–15)
BUN: 17 mg/dL (ref 6–20)
CO2: 27 mmol/L (ref 22–32)
Calcium: 8.9 mg/dL (ref 8.9–10.3)
Chloride: 106 mmol/L (ref 101–111)
Creatinine, Ser: 1.41 mg/dL — ABNORMAL HIGH (ref 0.44–1.00)
GFR calc Af Amer: 40 mL/min — ABNORMAL LOW (ref >60)
GFR calc non Af Amer: 34 mL/min — ABNORMAL LOW (ref >60)
Glucose, Bld: 104 mg/dL — ABNORMAL HIGH (ref 65–99)
Potassium: 4.7 mmol/L (ref 3.5–5.1)
Sodium: 140 mmol/L (ref 135–145)

## 2015-09-09 MED ORDER — TORSEMIDE 20 MG PO TABS: 20.0000 mg | ORAL_TABLET | Freq: Every day | ORAL | Status: DC

## 2015-09-09 MED ORDER — PANTOPRAZOLE SODIUM 40 MG PO TBEC: 40.0000 mg | DELAYED_RELEASE_TABLET | Freq: Every day | ORAL | Status: DC

## 2015-09-09 MED ORDER — METOPROLOL TARTRATE 100 MG PO TABS: 50.0000 mg | ORAL_TABLET | Freq: Every day | ORAL | Status: DC

## 2015-09-09 MED ORDER — METOPROLOL TARTRATE 100 MG PO TABS: 50.0000 mg | ORAL_TABLET | Freq: Two times a day (BID) | ORAL | Status: DC

## 2015-09-09 MED ORDER — CLOPIDOGREL BISULFATE 75 MG PO TABS: 75.0000 mg | ORAL_TABLET | Freq: Every day | ORAL | Status: DC

## 2015-09-09 NOTE — Discharge Summary (Signed)
Discharge Summary    Patient ID: [name],  MRN: [id number], DOB/AGE: 01-09-1933 82 y.o.  Admit date: 09/08/2015 Discharge date: 09/09/2015  Primary Care Provider: Rusty Aus Primary Cardiologist: Dr. Fletcher Anon   Discharge Diagnoses    Active Problems:   PAD (peripheral artery disease) (HCC)   Allergies Allergies  Allergen Reactions  . Amlodipine Nausea And Vomiting  . Ciprofloxacin Nausea Only  . Doxycycline Itching and Other (See Comments)    shaky  . Lipitor [Atorvastatin] Other (See Comments)    Joint pain   . Macrobid [Nitrofurantoin Macrocrystal] Rash  . Penicillins Rash and Other (See Comments)    Has patient had a PCN reaction causing immediate rash, facial/tongue/throat swelling, SOB or lightheadedness with hypotension: yes Has patient had a PCN reaction causing severe rash involving mucus membranes or skin necrosis: no Has patient had a PCN reaction that required hospitalization no Has patient had a PCN reaction occurring within the last 10 years: no If all of the above answers are "NO", then may proceed with Cephalosporin use.   . Sulfamethoxazole-Trimethoprim Rash    Diagnostic Studies/Procedures    09/08/2015: Peripheral vascular catheterization  Procedures    Peripheral Vascular Balloon Angioplasty   Abdominal Aortogram w/Lower Extremity    Conclusion    1. Significant proximal right popliteal artery stenosis. Short occlusion of the right anterior tibial artery. Significant disease affecting the TP trunk with occlusion of the posterior tibial artery.  2. Successful angioplasty of the anterior tibial artery which was totally occluded. Successful angioplasty and drug-coated balloon angioplasty of the right proximal popliteal artery.  Recommendations: Dual antiplatelet therapy for at least one month. Given chronic kidney disease, I'm admitting her overnight for hydration. 80 mL of contrast was used for whole procedure.    _____________     History of Present Illness     Ms. Boulais is an 82 year old female of Dr. Fletcher Anon with past medical history of PAD and atrial fibrillation. She had previous previous cardioversion in 2012. She has been maintaining in sinus rhythm with amiodarone. She has no history of coronary artery disease with previous nuclear stress test in 2012. Anticoagulation with warfarin was stopped due to frequent falls. She has chronic kidney disease with chronic leg edema. She was seen in April, 2016 for bilateral leg pain. She had previous right knee replacements with some improvement in her symptoms. She was noted to have chronic ulceration on the right lateral ankle for few years which has required wound care.  Noninvasive vascular evaluation showed an ABI of 0.68 on the right and 0.85 on the left. Angiography in May, 2016 showed: 1. No significant aortoiliac disease. 2. Moderate nonobstructive disease affecting the distal right SFA and popliteal artery. One-vessel runoff below the knee via the peroneal artery with short occlusion of the anterior tibial artery and reconstitution. In spite of continued wound care, the patient continued to have the ulceration on the right lateral ankle. She was seen in the office on 08/30/2015 with plans to repeat angiography with the intention of endovascular intervention on the right anterior tibial artery.  Hospital Course     Ms. Rains Presented for outpatient peripheral vascular catheterization to the right extremity with Dr. Fletcher Anon on 09/08/2015. Noted to have significant proximal right popliteal artery stenosis, and short occlusion of the right anterior tibial artery with disease affecting the TP trunk with occlusion to the posterior tibial artery. She underwent successful angioplasty of the anterior tibial artery which was totally occluded. Successful  angioplasty with drug coated balloon angioplasty to the right proximal popliteal artery. Recommendations for dual antiplatelet therapy for  at least a month were given, and she was admitted overnight for gentle hydration given her chronic kidney disease. Of note left groin site was used for the procedure.   On 09/09/2015 she had a noted stable creatinine of 1.41. She denies any pain in bilateral extremities. All vital signs and labs are noted to be stable. Left groin cath site is stable, without bruising or hematoma. Patient has been ambulatory on the unit without any complications.  Physical Exam:  GEN: Well nourished, well developed, in no acute distress  HEENT: normal  Neck: no JVD, carotid bruits, or masses Cardiac: RRR; no murmurs, rubs, or gallops,no edema  Respiratory: clear to auscultation bilaterally, normal work of breathing GI: soft, nontender, nondistended, + BS MS: no deformity or atrophy  Skin: warm and dry, no rash, left groin cath site is stable without bruising or hematoma. Neuro: Strength and sensation are intact Psych: euthymic mood, full affect Extremities: Pulses diminished bilaterally in lower extremities.  She was seen and examined by Dr. Gwenlyn Found and determined stable for discharge. Follow-up appointment has been arranged, along with outpatient LEA Dopplers.  _____________  Discharge Vitals Blood pressure 114/54, pulse 89, temperature 98.1 F (36.7 C), temperature source Oral, resp. rate 24, height 5\' 8"  (1.727 m), weight 145 lb 8.1 oz (66 kg), SpO2 93 %.  Filed Weights   09/08/15 0725 09/09/15 0223  Weight: 144 lb (65.318 kg) 145 lb 8.1 oz (66 kg)    Labs & Radiologic Studies    Basic Metabolic Panel  Recent Labs  09/09/15 0452  NA 140  K 4.7  CL 106  CO2 27  GLUCOSE 104*  BUN 17  CREATININE 1.41*  CALCIUM 8.9   _____________    Disposition   Pt is being discharged home today in good condition.  Follow-up Plans & Appointments    Follow-up Information    Follow up with [facility] On 09/22/2015.   Specialty:  Cardiology   Why:  12:15pm for your outpatient  dopplers   Contact information:   [address] [place] [phone]     Discharge Instructions    Diet - low sodium heart healthy    Complete by:  As directed      Increase activity slowly    Complete by:  As directed            Discharge Medications   Current Discharge Medication List    START taking these medications   Details  clopidogrel (PLAVIX) 75 MG tablet Take 1 tablet (75 mg total) by mouth daily with breakfast. Qty: 30 tablet, Refills: 11    pantoprazole (PROTONIX) 40 MG tablet Take 1 tablet (40 mg total) by mouth daily. Qty: 30 tablet, Refills: 11      CONTINUE these medications which have CHANGED   Details  metoprolol (LOPRESSOR) 100 MG tablet Take 0.5 tablets (50 mg total) by mouth 2 (two) times daily. Qty: 30 tablet, Refills: 11   Associated Diagnoses: Renal insufficiency; SOB (shortness of breath)    torsemide (DEMADEX) 20 MG tablet Take 1 tablet (20 mg total) by mouth daily. Qty: 60 tablet, Refills: 3   Associated Diagnoses: Paroxysmal atrial fibrillation ([place]); Essential hypertension; Chronic diastolic congestive heart failure ([place])      CONTINUE these medications which have NOT CHANGED   Details  acetaminophen (TYLENOL) 500 MG tablet Take 500 mg  by mouth every 6 (six) hours as needed (For pain.).    amiodarone (PACERONE) 200 MG tablet Take 1 tablet (200 mg total) by mouth daily. Qty: 90 tablet, Refills: 3   Associated Diagnoses: Renal insufficiency; SOB (shortness of breath)    Ascorbic Acid (VITAMIN C PO) Take 1 tablet by mouth daily.     aspirin EC 81 MG tablet Take 1 tablet (81 mg total) by mouth daily. Qty: 90 tablet, Refills: 3   Associated Diagnoses: Paroxysmal atrial fibrillation ([place]); Essential hypertension; Chronic diastolic congestive heart failure (HCC)    calcium-vitamin D (OSCAL WITH D) 500-200 MG-UNIT per tablet Take 1 tablet by mouth daily with breakfast.    Cholecalciferol (VITAMIN D-3  PO) Take 1 tablet by mouth daily.    docusate sodium (COLACE) 100 MG capsule Take 100 mg by mouth daily as needed for mild constipation.    Iron-Vitamin C 65-125 MG TABS Take 1 tablet by mouth daily.    levothyroxine (SYNTHROID, LEVOTHROID) 150 MCG tablet Take 150 mcg by mouth daily before breakfast.     lisinopril (PRINIVIL,ZESTRIL) 10 MG tablet Take 10 mg by mouth daily.    Multiple Vitamin (MULTIVITAMIN) tablet Take 1 tablet by mouth daily.      potassium chloride (KLOR-CON) 10 MEQ CR tablet Take 20 mEq by mouth daily. PT TAKES TWO EXTRA TABLETS WITH EXTRA FLUID PILL    pravastatin (PRAVACHOL) 40 MG tablet Take 40 mg by mouth at bedtime.     spironolactone (ALDACTONE) 25 MG tablet Take 25 mg by mouth daily.   Associated Diagnoses: PAD (peripheral artery disease) (HCC)    temazepam (RESTORIL) 15 MG capsule Take 15 mg by mouth at bedtime as needed for sleep.  Refills: 5    venlafaxine (EFFEXOR-XR) 75 MG 24 hr capsule Take 75 mg by mouth daily.        STOP taking these medications     omeprazole (PRILOSEC) 40 MG capsule           Outstanding Labs/Studies   None  Duration of Discharge Encounter   Greater than 30 minutes including physician time.  Signed, Reino Bellis NP-C 09/09/2015, 10:44 AM  Agree with note by Reino Bellis NP-C  S/P R pop and Ant tib PTA for CLI. Looks great. SCr actually better. Exam benign. OK for DC home. LEA next week the ROV with Dr. Kerry Kass, M.D., [place], [facility], [place] [address] [place], [place]  [address]  [phone] 09/09/2015 11:11 AM

## 2015-09-09 NOTE — Discharge Instructions (Signed)

## 2015-09-09 NOTE — Care Management Note (Signed)
Case Management Note  Patient Details  Name: [name] MRN: [id number] Date of Birth: 24-Jun-1932  Subjective/Objective:  Patient is from home, will be on plavix.  NCM will cont to follow for dc needs.                  Action/Plan:   Expected Discharge Date:                  Expected Discharge Plan:  Home/Self Care  In-House Referral:     Discharge planning Services  CM Consult  Post Acute Care Choice:    Choice offered to:     DME Arranged:    DME Agency:     HH Arranged:    [place] Agency:     Status of Service:  Completed, signed off  Medicare Important Message Given:    Date Medicare IM Given:    Medicare IM give by:    Date Additional Medicare IM Given:    Additional Medicare Important Message give by:     If discussed at [place] of Stay Meetings, dates discussed:    Additional Comments:  Zenon Mayo, RN 09/09/2015, 10:40 AM

## 2015-09-16 ENCOUNTER — Ambulatory Visit
Admission: RE | Admit: 2015-09-16 | Discharge: 2015-09-16 | Disposition: A | Discharge status: Home / Self Care | Payer: PPO | Source: Ambulatory Visit | Attending: Family Medicine | Admitting: Family Medicine

## 2015-09-16 ENCOUNTER — Other Ambulatory Visit: Payer: Self-pay | Admitting: Family Medicine

## 2015-09-16 DIAGNOSIS — D0511 Intraductal carcinoma in situ of right breast: Secondary | ICD-10-CM

## 2015-09-16 DIAGNOSIS — Z1231 Encounter for screening mammogram for malignant neoplasm of breast: Secondary | ICD-10-CM

## 2015-09-20 LAB — BASIC METABOLIC PANEL
BUN/Creatinine Ratio: 18 (ref 12–28)
BUN: 29 mg/dL — ABNORMAL HIGH (ref 8–27)
CALCIUM: 8.7 mg/dL (ref 8.7–10.3)
CHLORIDE: 102 mmol/L (ref 96–106)
CO2: 21 mmol/L (ref 18–29)
Creatinine, Ser: 1.63 mg/dL — ABNORMAL HIGH (ref 0.57–1.00)
GFR calc Af Amer: 34 mL/min/{1.73_m2} — ABNORMAL LOW (ref >59)
GFR calc non Af Amer: 30 mL/min/{1.73_m2} — ABNORMAL LOW (ref >59)
GLUCOSE: 60 mg/dL — AB (ref 65–99)
POTASSIUM: 4.9 mmol/L (ref 3.5–5.2)
SODIUM: 142 mmol/L (ref 134–144)

## 2015-09-20 LAB — PROTIME-INR
INR: 1 (ref 0.8–1.2)
PROTHROMBIN TIME: 10.5 s (ref 9.1–12.0)

## 2015-09-22 ENCOUNTER — Inpatient Hospital Stay (HOSPITAL_COMMUNITY): Admit: 2015-09-22 | Payer: PPO

## 2015-09-22 DIAGNOSIS — I5043 Acute on chronic combined systolic (congestive) and diastolic (congestive) heart failure: Secondary | ICD-10-CM | POA: Diagnosis not present

## 2015-09-22 DIAGNOSIS — R5383 Other fatigue: Secondary | ICD-10-CM | POA: Diagnosis not present

## 2015-09-22 DIAGNOSIS — L03119 Cellulitis of unspecified part of limb: Secondary | ICD-10-CM | POA: Diagnosis not present

## 2015-09-22 DIAGNOSIS — I739 Peripheral vascular disease, unspecified: Secondary | ICD-10-CM | POA: Diagnosis not present

## 2015-09-22 DIAGNOSIS — L02619 Cutaneous abscess of unspecified foot: Secondary | ICD-10-CM | POA: Diagnosis not present

## 2015-09-27 ENCOUNTER — Ambulatory Visit (HOSPITAL_COMMUNITY)
Admission: RE | Admit: 2015-09-27 | Discharge: 2015-09-27 | Disposition: A | Discharge status: Home / Self Care | Payer: PPO | Source: Ambulatory Visit | Attending: Cardiovascular Disease | Admitting: Cardiovascular Disease

## 2015-09-27 ENCOUNTER — Other Ambulatory Visit: Payer: Self-pay | Admitting: Cardiovascular Disease

## 2015-09-27 DIAGNOSIS — I10 Essential (primary) hypertension: Secondary | ICD-10-CM | POA: Insufficient documentation

## 2015-09-27 DIAGNOSIS — R938 Abnormal findings on diagnostic imaging of other specified body structures: Secondary | ICD-10-CM | POA: Insufficient documentation

## 2015-09-27 DIAGNOSIS — K219 Gastro-esophageal reflux disease without esophagitis: Secondary | ICD-10-CM | POA: Diagnosis not present

## 2015-09-27 DIAGNOSIS — E785 Hyperlipidemia, unspecified: Secondary | ICD-10-CM | POA: Diagnosis not present

## 2015-09-27 DIAGNOSIS — I739 Peripheral vascular disease, unspecified: Secondary | ICD-10-CM

## 2015-09-28 DIAGNOSIS — M5136 Other intervertebral disc degeneration, lumbar region: Secondary | ICD-10-CM | POA: Diagnosis not present

## 2015-09-28 DIAGNOSIS — M5416 Radiculopathy, lumbar region: Secondary | ICD-10-CM | POA: Diagnosis not present

## 2015-09-28 DIAGNOSIS — M4806 Spinal stenosis, lumbar region: Secondary | ICD-10-CM | POA: Diagnosis not present

## 2015-10-01 ENCOUNTER — Ambulatory Visit (INDEPENDENT_AMBULATORY_CARE_PROVIDER_SITE_OTHER): Payer: PPO | Admitting: Cardiovascular Disease

## 2015-10-01 ENCOUNTER — Encounter: Payer: Self-pay | Admitting: Cardiovascular Disease

## 2015-10-01 VITALS — BP 126/60 | HR 64 | Ht 68.0 in | Wt 147.0 lb

## 2015-10-01 DIAGNOSIS — I739 Peripheral vascular disease, unspecified: Secondary | ICD-10-CM | POA: Diagnosis not present

## 2015-10-01 MED ORDER — PANTOPRAZOLE SODIUM 40 MG PO TBEC: 40.0000 mg | DELAYED_RELEASE_TABLET | Freq: Every day | ORAL | Status: DC

## 2015-10-01 MED ORDER — CLOPIDOGREL BISULFATE 75 MG PO TABS: 75.0000 mg | ORAL_TABLET | Freq: Every day | ORAL | Status: DC

## 2015-10-01 NOTE — Progress Notes (Signed)
Cardiology Office Note   Date:  10/01/2015   ID:  Pricilla Holm Bailey, DOB 1932-09-09, MRN [id number]  PCP:  Rusty Aus, MD  Cardiologist:   Kathlyn Sacramento, MD   Chief Complaint  Patient presents with  . other    1 month follow up.       History of Present Illness: [name] is a 82 y.o. female who presents for peripheral arterial disease and atrial fibrillation.  She had previous previous cardioversion in 2012. She has been maintaining in sinus rhythm with amiodarone. She has no history of coronary artery disease with previous nuclear stress test in 2012. Anticoagulation with warfarin was stopped due to frequent falls. She has chronic kidney disease with chronic leg edema. She was seen in April, 2016 for bilateral leg pain. She had previous right knee replacements with some improvement in her symptoms. She was noted to have chronic ulceration on the right lateral ankle for few years which has required wound care.   Noninvasive vascular evaluation showed an ABI of 0.68 on the right and 0.85 on the left. Angiography in May, 2016 showed: 1. No significant aortoiliac disease. 2. Moderate nonobstructive disease affecting the distal right SFA and popliteal artery. One-vessel runoff below the knee via the peroneal artery with short occlusion of the anterior tibial artery and reconstitution.  In spite of continued wound care, the patient continues to have the ulceration on the right lateral ankle. Thus, I proceeded with repeat angiography last month which showed significant proximal right popliteal artery stenosis with short occlusion of the right anterior tibial artery. There was also significant disease affecting the TP trunk with occlusion of the posterior tibial artery. I performed successful angioplasty of the anterior tibial artery and drug-coated balloon angioplasty of the right proximal popliteal artery. She had follow-up vascular studies which showed improvement in ABI to 0.92. Duplex  showed patent popliteal artery and anterior tibial artery. There was possible occlusion of the peroneal artery.  She reports no significant improvement in the ulcer. She has not been taking Plavix as prescribed. No significant pain.    Past Medical History  Diagnosis Date  . Hypertension   . Hyperlipidemia   . Hypothyroid   . Atrial fibrillation ([place])     Echocardiogram 5/12: normal LV function, moderate LAE, mild RAE, mild AI and MR and mild-to-moderate TR.  Myoview 5/ 12 showed an ejection fraction of 73% and normal perfusion.   Patient underwent elective DCCV on 08/26/10  . 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 PVD (peripheral vascular disease) ([place])   . Complication of anesthesia     "took me about 1 week to know what was going on after one of my knee ORs"  . COPD (chronic obstructive pulmonary disease) ([place])   . GERD (gastroesophageal reflux disease)   . Arthritis     "back, hands" (09/08/2015)  . Chronic lower back pain   . PAD (peripheral artery disease) ([place])   . Cancer of right breast ([place])   . Breast cancer ([place]) 2007, 2013    right breast- 2007 radiation    Past Surgical History  Procedure Laterality Date  . Abdominal hysterectomy    . Back surgery    . Total knee arthroplasty Right   . Tmj arthroplasty    . Tonsillectomy    . Appendectomy    . Cardioversion  11/10/2011    Procedure: CARDIOVERSION;  Surgeon: Lelon Perla, MD;  Location: [facility];  Service: Cardiovascular;  Laterality: N/A;  . Peripheral vascular catheterization N/A 09/08/2015    Procedure: Abdominal Aortogram w/Lower Extremity;  Surgeon: Wellington Hampshire, MD;  Location: [facility];  Service: Cardiovascular;  Laterality: N/A;  . Peripheral vascular catheterization  09/08/2015    Procedure: Peripheral Vascular Balloon Angioplasty;  Surgeon: Wellington Hampshire, MD;  Location: [facility];  Service: Cardiovascular;;  . Balloon angioplasty, artery Right 09/08/2015     superficial femoral  . Joint replacement    . Revision total knee arthroplasty Right   . Lumbar disc surgery    . Cardioversion  08/26/2010  . Abdominal exploration surgery      "had to go back in after hysterectomy & check on stitch dr had put near my bladder; don't know if they took it out; had to wear catheter for 1 month"  . Mastectomy Right 2013    positive  . Breast excisional biopsy Right 2007    positive     Current Outpatient Prescriptions  Medication Sig Dispense Refill  . acetaminophen (TYLENOL) 500 MG tablet Take 500 mg by mouth every 6 (six) hours as needed (For pain.).    Marland Kitchen amiodarone (PACERONE) 200 MG tablet Take 1 tablet (200 mg total) by mouth daily. 90 tablet 3  . Ascorbic Acid (VITAMIN C PO) Take 1 tablet by mouth daily.     Marland Kitchen aspirin EC 81 MG tablet Take 1 tablet (81 mg total) by mouth daily. 90 tablet 3  . calcium-vitamin D (OSCAL WITH D) 500-200 MG-UNIT per tablet Take 1 tablet by mouth daily with breakfast.    . Cholecalciferol (VITAMIN D-3 PO) Take 1 tablet by mouth daily.    Marland Kitchen docusate sodium (COLACE) 100 MG capsule Take 100 mg by mouth daily as needed for mild constipation.    . Iron-Vitamin C 65-125 MG TABS Take 1 tablet by mouth daily.    Marland Kitchen levothyroxine (SYNTHROID, LEVOTHROID) 150 MCG tablet Take 150 mcg by mouth daily before breakfast.     . lisinopril (PRINIVIL,ZESTRIL) 10 MG tablet Take 10 mg by mouth daily.    . metoprolol (LOPRESSOR) 100 MG tablet Take 0.5 tablets (50 mg total) by mouth 2 (two) times daily. 30 tablet 11  . Multiple Vitamin (MULTIVITAMIN) tablet Take 1 tablet by mouth daily.      . pantoprazole (PROTONIX) 40 MG tablet Take 1 tablet (40 mg total) by mouth daily. 30 tablet 11  . potassium chloride (KLOR-CON) 10 MEQ CR tablet Take 20 mEq by mouth daily. PT TAKES TWO EXTRA TABLETS WITH EXTRA FLUID PILL    . pravastatin (PRAVACHOL) 40 MG tablet Take 40 mg by mouth at bedtime.     Marland Kitchen spironolactone (ALDACTONE) 25 MG tablet Take 25 mg by mouth  daily.    . temazepam (RESTORIL) 15 MG capsule Take 15 mg by mouth at bedtime as needed for sleep.   5  . torsemide (DEMADEX) 20 MG tablet Take 1 tablet (20 mg total) by mouth daily. 60 tablet 3  . venlafaxine (EFFEXOR-XR) 75 MG 24 hr capsule Take 75 mg by mouth daily.       No current facility-administered medications for this visit.    Allergies:   Amlodipine; Ciprofloxacin; Doxycycline; Lipitor; Macrobid; Penicillins; and Sulfamethoxazole-trimethoprim    Social History:  The patient  reports that she has never smoked. She has never used smokeless tobacco. She reports that she does not drink alcohol or use illicit drugs.   Family History:  The patient's family  history includes Breast cancer in her maternal aunt; Coronary artery disease (age of onset: 10) in her sister; Heart attack in her father.    ROS:  Please see the history of present illness.   Otherwise, review of systems are positive for none.   All other systems are reviewed and negative.    PHYSICAL EXAM: VS:  BP 126/60 mmHg  Ht 5\' 8"  (1.727 m)  Wt 147 lb (66.679 kg)  BMI 22.36 kg/m2 , BMI Body mass index is 22.36 kg/(m^2). GEN: Well nourished, well developed, in no acute distress HEENT: normal Neck: no JVD, carotid bruits, or masses Cardiac: RRR; no murmurs, rubs, or gallops,no edema  Respiratory:  clear to auscultation bilaterally, normal work of breathing GI: soft, nontender, nondistended, + BS MS: no deformity or atrophy Skin: warm and dry, no rash Neuro:  Strength and sensation are intact Psych: euthymic mood, full affect Femoral pulses are normal. No hematoma. There is a strong palpable dorsalis pedis pulse on the right.  EKG:  EKG is not ordered today.   Recent Labs: 09/01/2015: ALT 45; Hemoglobin 12.4; Platelets 232 09/09/2015: BUN 17; Creatinine, Ser 1.41*; Potassium 4.7; Sodium 140    Lipid Panel No results found for: CHOL, TRIG, HDL, CHOLHDL, VLDL, LDLCALC, LDLDIRECT    Wt Readings from Last 3  Encounters:  10/01/15 147 lb (66.679 kg)  09/09/15 145 lb 8.1 oz (66 kg)  09/01/15 147 lb 4.3 oz (66.8 kg)        ASSESSMENT AND PLAN:  1.  Peripheral arterial disease with continued nonhealing ulcer on the right lateral ankle.   I performed successful angioplasty to the right popliteal artery and was also able to open the occluded anterior tibial artery with good results. Her ABI improved almost to normal and she has a strong dorsalis pedis pulse. Surprisingly, the ulcer has not improved. I asked her to continue follow-up at the wound clinic. I also instructed her to start taking Plavix which are planning to use for at least a few months. The patient does have significant disease affecting the TP trunk and occluded posterior tibial artery. If complete healing does not happen, we might need to consider angioplasty of the TP trunk into the peroneal artery.  2. Persistent atrial fibrillation: Currently maintaining in sinus rhythm with amiodarone.   3. Chronic diastolic heart failure: She appears to be euvolemic.  4. Essential hypertension: Blood pressure is controlled on current medications.      Disposition:   FU with me in 2 month  Signed,  Kathlyn Sacramento, MD  10/01/2015 3:18 PM    [place]

## 2015-10-01 NOTE — Patient Instructions (Addendum)
Medication Instructions:  Your physician has recommended you make the following change in your medication:   STOP taking omeprazole RESUME Plavix 75mg  once daily RESUME Protonix 40mg  once daily  Labwork: none  Testing/Procedures: none  Follow-Up: Your physician recommends that you schedule a follow-up appointment in: two months with Dr. Fletcher Anon.    Any Other Special Instructions Will Be Listed Below (If Applicable). Please follow up with the wound center in 1-2 weeks.      If you need a refill on your cardiac medications before your next appointment, please call your pharmacy.

## 2015-10-07 DIAGNOSIS — I10 Essential (primary) hypertension: Secondary | ICD-10-CM | POA: Diagnosis not present

## 2015-10-07 DIAGNOSIS — R110 Nausea: Secondary | ICD-10-CM | POA: Diagnosis not present

## 2015-10-07 DIAGNOSIS — I482 Chronic atrial fibrillation: Secondary | ICD-10-CM | POA: Diagnosis not present

## 2015-10-07 DIAGNOSIS — R5383 Other fatigue: Secondary | ICD-10-CM | POA: Diagnosis not present

## 2015-10-26 DIAGNOSIS — Z0000 Encounter for general adult medical examination without abnormal findings: Secondary | ICD-10-CM | POA: Diagnosis not present

## 2015-10-26 DIAGNOSIS — R8290 Unspecified abnormal findings in urine: Secondary | ICD-10-CM | POA: Diagnosis not present

## 2015-10-26 DIAGNOSIS — R300 Dysuria: Secondary | ICD-10-CM | POA: Diagnosis not present

## 2015-10-26 DIAGNOSIS — M545 Low back pain: Secondary | ICD-10-CM | POA: Diagnosis not present

## 2015-10-27 DIAGNOSIS — I482 Chronic atrial fibrillation: Secondary | ICD-10-CM | POA: Diagnosis not present

## 2015-10-27 DIAGNOSIS — I739 Peripheral vascular disease, unspecified: Secondary | ICD-10-CM | POA: Diagnosis not present

## 2015-10-27 DIAGNOSIS — E039 Hypothyroidism, unspecified: Secondary | ICD-10-CM | POA: Diagnosis not present

## 2015-11-05 DIAGNOSIS — H2513 Age-related nuclear cataract, bilateral: Secondary | ICD-10-CM | POA: Diagnosis not present

## 2015-11-10 DIAGNOSIS — K529 Noninfective gastroenteritis and colitis, unspecified: Secondary | ICD-10-CM | POA: Diagnosis not present

## 2015-11-24 DIAGNOSIS — L97311 Non-pressure chronic ulcer of right ankle limited to breakdown of skin: Secondary | ICD-10-CM | POA: Diagnosis not present

## 2015-11-24 DIAGNOSIS — I739 Peripheral vascular disease, unspecified: Secondary | ICD-10-CM | POA: Diagnosis not present

## 2015-12-03 DIAGNOSIS — M5416 Radiculopathy, lumbar region: Secondary | ICD-10-CM | POA: Diagnosis not present

## 2015-12-03 DIAGNOSIS — M5136 Other intervertebral disc degeneration, lumbar region: Secondary | ICD-10-CM | POA: Diagnosis not present

## 2015-12-06 ENCOUNTER — Ambulatory Visit: Payer: PPO | Admitting: Cardiovascular Disease

## 2015-12-06 ENCOUNTER — Encounter (HOSPITAL_COMMUNITY): Payer: Self-pay | Admitting: *Deleted

## 2015-12-06 DIAGNOSIS — R350 Frequency of micturition: Secondary | ICD-10-CM | POA: Diagnosis not present

## 2015-12-06 DIAGNOSIS — R8290 Unspecified abnormal findings in urine: Secondary | ICD-10-CM | POA: Diagnosis not present

## 2015-12-14 DIAGNOSIS — R634 Abnormal weight loss: Secondary | ICD-10-CM | POA: Diagnosis not present

## 2015-12-14 DIAGNOSIS — R1032 Left lower quadrant pain: Secondary | ICD-10-CM | POA: Diagnosis not present

## 2015-12-14 DIAGNOSIS — R197 Diarrhea, unspecified: Secondary | ICD-10-CM | POA: Diagnosis not present

## 2015-12-14 DIAGNOSIS — R1031 Right lower quadrant pain: Secondary | ICD-10-CM | POA: Diagnosis not present

## 2015-12-21 ENCOUNTER — Other Ambulatory Visit
Admission: RE | Admit: 2015-12-21 | Discharge: 2015-12-21 | Disposition: A | Discharge status: Home / Self Care | Payer: PPO | Source: Ambulatory Visit | Attending: Gastroenterology | Admitting: Gastroenterology

## 2015-12-21 DIAGNOSIS — R1032 Left lower quadrant pain: Secondary | ICD-10-CM | POA: Diagnosis not present

## 2015-12-21 DIAGNOSIS — R634 Abnormal weight loss: Secondary | ICD-10-CM | POA: Diagnosis not present

## 2015-12-21 DIAGNOSIS — R197 Diarrhea, unspecified: Secondary | ICD-10-CM | POA: Insufficient documentation

## 2015-12-21 DIAGNOSIS — R1031 Right lower quadrant pain: Secondary | ICD-10-CM | POA: Diagnosis not present

## 2015-12-21 LAB — GASTROINTESTINAL PANEL BY PCR, STOOL (REPLACES STOOL CULTURE)
ADENOVIRUS F40/41: NOT DETECTED
Astrovirus: NOT DETECTED
CRYPTOSPORIDIUM: NOT DETECTED
Campylobacter species: NOT DETECTED
Cyclospora cayetanensis: NOT DETECTED
ENTEROPATHOGENIC E COLI (EPEC): NOT DETECTED
ENTEROTOXIGENIC E COLI (ETEC): NOT DETECTED
Entamoeba histolytica: NOT DETECTED
Enteroaggregative E coli (EAEC): NOT DETECTED
GIARDIA LAMBLIA: NOT DETECTED
Norovirus GI/GII: NOT DETECTED
PLESIMONAS SHIGELLOIDES: NOT DETECTED
ROTAVIRUS A: NOT DETECTED
SAPOVIRUS (I, II, IV, AND V): NOT DETECTED
Salmonella species: NOT DETECTED
Shiga like toxin producing E coli (STEC): NOT DETECTED
Shigella/Enteroinvasive E coli (EIEC): NOT DETECTED
Vibrio cholerae: NOT DETECTED
Vibrio species: NOT DETECTED
Yersinia enterocolitica: NOT DETECTED

## 2015-12-21 LAB — C DIFFICILE QUICK SCREEN W PCR REFLEX
C DIFFICILE (CDIFF) INTERP: NOT DETECTED
C DIFFICILE (CDIFF) TOXIN: NEGATIVE
C DIFFICLE (CDIFF) ANTIGEN: NEGATIVE

## 2015-12-22 ENCOUNTER — Telehealth: Payer: Self-pay | Admitting: Cardiovascular Disease

## 2015-12-22 DIAGNOSIS — R197 Diarrhea, unspecified: Secondary | ICD-10-CM | POA: Diagnosis not present

## 2015-12-22 DIAGNOSIS — R634 Abnormal weight loss: Secondary | ICD-10-CM | POA: Diagnosis not present

## 2015-12-22 DIAGNOSIS — R1031 Right lower quadrant pain: Secondary | ICD-10-CM | POA: Diagnosis not present

## 2015-12-22 DIAGNOSIS — K625 Hemorrhage of anus and rectum: Secondary | ICD-10-CM | POA: Diagnosis not present

## 2015-12-22 DIAGNOSIS — R1032 Left lower quadrant pain: Secondary | ICD-10-CM | POA: Diagnosis not present

## 2015-12-22 NOTE — Telephone Encounter (Signed)
Received cardiac clearance request from [place], [id number] @ [facility] for 9/19 colonoscopy. Placed in MD basket

## 2015-12-24 NOTE — Telephone Encounter (Signed)
Appt scheduled 9/13. Clearance form on Sharon's desk

## 2015-12-28 NOTE — Telephone Encounter (Signed)
Cardiac clearance request given to [name].

## 2015-12-29 ENCOUNTER — Telehealth: Payer: Self-pay | Admitting: Student

## 2015-12-29 ENCOUNTER — Ambulatory Visit (INDEPENDENT_AMBULATORY_CARE_PROVIDER_SITE_OTHER): Payer: PPO | Admitting: Student

## 2015-12-29 ENCOUNTER — Encounter: Payer: Self-pay | Admitting: Student

## 2015-12-29 VITALS — BP 110/58 | HR 54 | Ht 68.0 in | Wt 131.2 lb

## 2015-12-29 DIAGNOSIS — I739 Peripheral vascular disease, unspecified: Secondary | ICD-10-CM | POA: Diagnosis not present

## 2015-12-29 DIAGNOSIS — E785 Hyperlipidemia, unspecified: Secondary | ICD-10-CM

## 2015-12-29 DIAGNOSIS — I5032 Chronic diastolic (congestive) heart failure: Secondary | ICD-10-CM

## 2015-12-29 DIAGNOSIS — Z01810 Encounter for preprocedural cardiovascular examination: Secondary | ICD-10-CM

## 2015-12-29 DIAGNOSIS — I480 Paroxysmal atrial fibrillation: Secondary | ICD-10-CM | POA: Diagnosis not present

## 2015-12-29 DIAGNOSIS — I10 Essential (primary) hypertension: Secondary | ICD-10-CM

## 2015-12-29 MED ORDER — ROSUVASTATIN CALCIUM 10 MG PO TABS: 10.0000 mg | ORAL_TABLET | Freq: Every day | ORAL | 6 refills | Status: DC

## 2015-12-29 NOTE — Telephone Encounter (Signed)
Clearance/Risk Assessment form received from [facility] 12/28/15. Placed in "To Do" bin on Derrich Gaby's desk.

## 2015-12-29 NOTE — Patient Instructions (Signed)
Medication Instructions:  Please STOP pravastatin Please START Crestor 10 mg once daily  Labwork: None  Testing/Procedures: None  Follow-Up: Your physician wants you to follow-up in: 6 months w/ Dr. Fletcher Anon.  You will receive a reminder letter in the mail two months in advance.  If you don't receive a letter, please call our office to schedule the follow-up appointment.  If you need a refill on your cardiac medications before your next appointment, please call your pharmacy.

## 2015-12-29 NOTE — Progress Notes (Signed)
Cardiology Office Note    Date:  12/29/2015   ID:  [name], DOB [birth year], MRN [id number]  PCP:  Rusty Aus, MD  Cardiologist:  Dr. Fletcher Anon  Chief Complaint  Patient presents with  . other    Cardiac clearance for a colonoscopy. Meds reviewed by the patient verbally.     History of Present Illness:    [name] is a 82 y.o. female PAF (s/p DCCV in 2012, on Amiodarone, no anticoagulation due to frequent falls), chronic diastolic CHF, HTN and PVD who presents to the office today for cardiac clearance in regards to a colonoscopy.   Previous cardiology results include an ischemic evaluation with a NST in 2012 which showed no evidence of ischemia. Echocardiogram in 07/2013 showed a preserved EF of 60-65% with no WMA. Grade 2 DD noted along with mild AR and mild MR. She is followed by Dr. Fletcher Anon for PVD and underwent angiography in 08/2015 which showed significant proximal right popliteal stenosis with short occlusion of the right anterior tibial trunk. She underwent successful angioplasty with a drug coated balloon angioplasty and was on DAPT for one month. ABI's in 09/2015 were improved to 0.92 on right and 0.95 on left. Doppler studies showed patent popliteal and anterior tibial arteries with possible occlusion of the peroneal artery.   During today's encounter, she reports having worsening diarrhea for the past 6 weeks. Says she has multiple episodes per day and is now experiencing episodes of incontinence. Stopped her Plavix a few weeks back with no improvement in her symptoms. Has noted her stools are dark in color. She was seen by her PCP for this who recommended she see a Copywriter, advertising. Is scheduled for a colonoscopy on 9/19. Last colonoscopy was 5+ years ago and she tolerated this without any complications according to the patient.  She denies any recent exertional chest discomfort. Does report having a shooting pain along her left breast a few weeks ago, lasting for 2-3  seconds then resolving. No recurrence since and no association with exertion. Denies any dyspnea with exertion, orthopnea, or PND. Does occasionally notice swelling around her ankles. Is being followed by the wound clinic for the ulcer along her right ankle.   Also notes an aching sensation along her thighs bilaterally. Has been present for years, but has continued to worsen. Has not had a fall within the past month. Denies any dizziness or presyncope.     Past Medical History:  Diagnosis Date  . Arthritis    "back, hands" (09/08/2015)  . Atrial fibrillation ([place])    Echocardiogram 5/12: normal LV function, moderate LAE, mild RAE, mild AI and MR and mild-to-moderate TR.  Myoview 5/ 12 showed an ejection fraction of 73% and normal perfusion.   Patient underwent elective DCCV on 08/26/10  . Breast cancer ([place]) 2007, 2013   right breast- 2007 radiation  . Cancer of right breast ([place])   . Chronic lower back pain   . Complication of anesthesia    "took me about 1 week to know what was going on after one of my knee ORs"  . COPD (chronic obstructive pulmonary disease) ([place])   . 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 GERD (gastroesophageal reflux disease)   . Hyperlipidemia   . Hypertension   . Hypothyroid   . PAD (peripheral artery disease) ([place])   . PVD (peripheral vascular disease) ([place])     Past Surgical History:  Procedure Laterality Date  .  ABDOMINAL EXPLORATION SURGERY     "had to go back in after hysterectomy & check on stitch dr had put near my bladder; don't know if they took it out; had to wear catheter for 1 month"  . ABDOMINAL HYSTERECTOMY    . APPENDECTOMY    . BACK SURGERY    . BALLOON ANGIOPLASTY, ARTERY Right 09/08/2015   superficial femoral  . BREAST EXCISIONAL BIOPSY Right 2007   positive  . CARDIOVERSION  11/10/2011   Procedure: CARDIOVERSION;  Surgeon: Lelon Perla, MD;  Location: [facility];  Service: Cardiovascular;  Laterality: N/A;  .  CARDIOVERSION  08/26/2010  . JOINT REPLACEMENT    . LUMBAR DISC SURGERY    . MASTECTOMY Right 2013   positive  . PERIPHERAL VASCULAR CATHETERIZATION N/A 09/08/2015   Procedure: Abdominal Aortogram w/Lower Extremity;  Surgeon: Wellington Hampshire, MD;  Location: [facility];  Service: Cardiovascular;  Laterality: N/A;  . PERIPHERAL VASCULAR CATHETERIZATION  09/08/2015   Procedure: Peripheral Vascular Balloon Angioplasty;  Surgeon: Wellington Hampshire, MD;  Location: [facility];  Service: Cardiovascular;;  . REVISION TOTAL KNEE ARTHROPLASTY Right   . TMJ ARTHROPLASTY    . TONSILLECTOMY    . TOTAL KNEE ARTHROPLASTY Right     Current Medications: Outpatient Medications Prior to Visit  Medication Sig Dispense Refill  . acetaminophen (TYLENOL) 500 MG tablet Take 500 mg by mouth every 6 (six) hours as needed (For pain.).    Marland Kitchen amiodarone (PACERONE) 200 MG tablet Take 1 tablet (200 mg total) by mouth daily. 90 tablet 3  . Ascorbic Acid (VITAMIN C PO) Take 1 tablet by mouth daily.     Marland Kitchen aspirin EC 81 MG tablet Take 1 tablet (81 mg total) by mouth daily. 90 tablet 3  . calcium-vitamin D (OSCAL WITH D) 500-200 MG-UNIT per tablet Take 1 tablet by mouth daily with breakfast.    . Cholecalciferol (VITAMIN D-3 PO) Take 1 tablet by mouth daily.    Marland Kitchen docusate sodium (COLACE) 100 MG capsule Take 100 mg by mouth daily as needed for mild constipation.    . Iron-Vitamin C 65-125 MG TABS Take 1 tablet by mouth daily.    Marland Kitchen lisinopril (PRINIVIL,ZESTRIL) 10 MG tablet Take 10 mg by mouth daily.    . metoprolol (LOPRESSOR) 100 MG tablet Take 0.5 tablets (50 mg total) by mouth 2 (two) times daily. 30 tablet 11  . Multiple Vitamin (MULTIVITAMIN) tablet Take 1 tablet by mouth daily.      . pantoprazole (PROTONIX) 40 MG tablet Take 1 tablet (40 mg total) by mouth daily. 30 tablet 0  . potassium chloride (KLOR-CON) 10 MEQ CR tablet Take 20 mEq by mouth daily. PT TAKES TWO EXTRA TABLETS WITH EXTRA FLUID PILL    .  spironolactone (ALDACTONE) 25 MG tablet Take 25 mg by mouth daily.    . temazepam (RESTORIL) 15 MG capsule Take 15 mg by mouth at bedtime as needed for sleep.   5  . torsemide (DEMADEX) 20 MG tablet Take 1 tablet (20 mg total) by mouth daily. 60 tablet 3  . venlafaxine (EFFEXOR-XR) 75 MG 24 hr capsule Take 75 mg by mouth daily.      . clopidogrel (PLAVIX) 75 MG tablet Take 1 tablet (75 mg total) by mouth daily with breakfast. 30 tablet 0  . pravastatin (PRAVACHOL) 40 MG tablet Take 40 mg by mouth at bedtime.     Marland Kitchen levothyroxine (SYNTHROID, LEVOTHROID) 150 MCG tablet Take 150 mcg by  mouth daily before breakfast.      No facility-administered medications prior to visit.      Allergies:   Amlodipine; Ciprofloxacin; Doxycycline; Lipitor [atorvastatin]; Macrobid [nitrofurantoin macrocrystal]; Penicillins; and Sulfamethoxazole-trimethoprim   Social History   Social History  . Marital status: Married    Spouse name: N/A  . Number of children: 2  . Years of education: N/A   Social History Main Topics  . Smoking status: Never Smoker  . Smokeless tobacco: Never Used  . Alcohol use No  . Drug use: No  . Sexual activity: No   Other Topics Concern  . None   Social History Narrative  . None     Family History:  The patient's family history includes Breast cancer in her maternal aunt; Coronary artery disease (age of onset: 61) in her sister; Heart attack in her father.   Review of Systems:   Please see the history of present illness.    Review of Systems  Constitution: Positive for decreased appetite, malaise/fatigue and weight loss. Negative for chills and fever.  Cardiovascular: Negative for chest pain, dyspnea on exertion, irregular heartbeat, near-syncope, palpitations and paroxysmal nocturnal dyspnea.  Respiratory: Negative for hemoptysis and shortness of breath.   Hematologic/Lymphatic: Bruises/bleeds easily.  Musculoskeletal: Positive for muscle weakness and myalgias. Negative for  falls and joint pain.  Gastrointestinal: Positive for change in bowel habit, bowel incontinence, diarrhea and melena. Negative for bloating, abdominal pain, hematemesis, hematochezia, nausea and vomiting.  Neurological: Negative for focal weakness, loss of balance and vertigo.   All other systems reviewed and are negative.   Physical Exam:    VS:  BP (!) 110/58 (BP Location: Left Arm, Patient Position: Sitting, Cuff Size: Normal)   Pulse (!) 54   Ht 5\' 8"  (1.727 m)   Wt 131 lb 4 oz (59.5 kg)   BMI 19.96 kg/m    General: Pleasant elderly Caucasian female appearing in no acute distress. Head: Normocephalic, atraumatic, sclera non-icteric, no xanthomas, nares are without discharge.  Neck: No carotid bruits. JVD not elevated.  Lungs: Respirations regular and unlabored, without wheezes or rales.  Heart: Regular rate and rhythm. No S3 or S4.  No murmur, no rubs, or gallops appreciated. Abdomen: Soft, non-tender, non-distended with normoactive bowel sounds. No hepatomegaly. No rebound/guarding. No obvious abdominal masses. Msk:  Strength and tone appear normal for age. No joint deformities or effusions. Extremities: No clubbing or cyanosis. No edema.  Distal pedal pulses are 2+ bilaterally. Ulceration along right ankle. Ecchymosis along lower extremities bilaterally. Neuro: Alert and oriented X 3. Moves all extremities spontaneously. No focal deficits noted. Psych:  Responds to questions appropriately with a normal affect. Skin: No rashes or lesions noted  Wt Readings from Last 3 Encounters:  12/29/15 131 lb 4 oz (59.5 kg)  10/01/15 147 lb (66.7 kg)  09/09/15 145 lb 8.1 oz (66 kg)      Studies/Labs Reviewed:   EKG:  EKG is  ordered today.  The ekg ordered today demonstrates sinus bradycardia, HR 54, with no acute ST por T-wave changes. QTc 472.  Recent Labs: 09/01/2015: ALT 45; Hemoglobin 12.4; Platelets 232 09/09/2015: BUN 17; Creatinine, Ser 1.41; Potassium 4.7; Sodium 140   Lipid  Panel No results found for: CHOL, TRIG, HDL, CHOLHDL, VLDL, LDLCALC, LDLDIRECT  Additional studies/ records that were reviewed today include:   Echocardiogram: 07/2013 Study Conclusions  - Left ventricle: The cavity size was normal. Wall thickness was normal. Systolic function was normal. The estimated ejection fraction was in  the range of 60% to 65%. Wall motion was normal; there were no regional wall motion abnormalities. Features are consistent with a pseudonormal left ventricular filling pattern, with concomitant abnormal relaxation and increased filling pressure (grade 2 diastolic dysfunction). - Aortic valve: Mild regurgitation. - Mitral valve: Calcified annulus. Mildly thickened leaflets . Mild regurgitation. - Left atrium: The atrium was mildly dilated. - Pulmonary arteries: Systolic pressure was mildly increased. PA peak pressure: 85mm Hg (S).  Assessment:    1. Preoperative cardiovascular examination   2. Chronic diastolic congestive heart failure (HCC)   3. Paroxysmal atrial fibrillation ([place])   4. PAD (peripheral artery disease) ([place])   5. Essential hypertension   6. Hyperlipidemia      Plan:   In order of problems listed above:  1. Preoperative Cardiovascular Examination for Colonopscopy - reports worsening diarrhea for the past 6 weeks and notes dark, tarry stools. Seen by GI who recommended colonoscopy.  - the patient denies any recent anginal symptoms. Describes one episode of shooting pain in her left breast which lasted 2-3 seconds and no recurrence since, overall atypical for a cardiac etiology. EKG without any acute ischemic changes. Able to ambulate over 50 yards without stopping (uses cane for assistance with ambulation due to frequent falls in the past).  - no evidence of volume overload on physical exam. No recent symptoms concerning for CHF exacerbation. - overall, she is of low to moderate-risk from a cardiac perspective for her  colonoscopy. No further cardiac testing needed prior to her procedure. Cardiac risk index factor based off of calculations is < 1% for risk of a major cardiac event.  - instructed to hold Plavix and ASA for 5-7 days prior to procedure (refer to information sheet provided to her by Dr. Gustavo Lah). Patient is 99% sure she is not taking Plavix but was strongly encouraged to make sure as this would delay her procedure.   2. Chronic Diastolic CHF - echo in 46/2703 showed a preserved EF of 60-65% with no WMA. Grade 2 DD noted along with mild AR and mild MR.  - does not appear volume overloaded on physical exam. No recent symptoms of orthopnea, PND, dyspnea on exertion, or worsening edema.  - continue current medication regimen.  3. Paroxysmal Atrial Fibrillation - s/p DCCV in 2012. In NSR during today's examination. Continue PO Amiodarone 200mg  daily and BB.  - This patients CHA2DS2-VASc Score and unadjusted Ischemic Stroke Rate (% per year) is equal to 7.2 % stroke rate/year from a score of 5 (CHF, HTN, Female, Age (2)). Not on anticoagulation secondary to frequent falls and possible GIB. Would address utilization of this if she had recurrent episodes of PAF, as she appears very functional during today's encounter.   4. PAD - underwent angiography in 08/2015 which showed significant proximal right popliteal stenosis with short occlusion of the right anterior tibial trunk, s/p successful angioplasty with a drug coated balloon angioplasty.  ABI's in 09/2015 were improved to 0.92 on right and 0.95 on left.  - continue ASA and statin therapy. Plavix listed on current medication list but patient says she is not taking this. Will hold for now with report of dark tarry stools and possible GIB. Notes from Dr. Fletcher Anon in 09/2015 mentioned only using Plavix for a few more months. Will ask Dr. Fletcher Anon to address long-term utilization of this for her PAD.     5. HTN - BP well-controlled at today's visit.  - continue  current medication regimen. HR in mid-50's during  examination today. If remains low or she develops symptoms, would reduce Lopressor dosing from 50mg  BID to 25mg  BID.   6. HLD - reports having myalgias along upper thighs bilaterally, at times interfering with walking and transferring positions.  - currently on Pravastatin 40mg  daily. Will switch to Crestor 10mg  daily to see if this helps with symptoms. Informed her to report back to [place] if this helps, as she was only given an Rx for 30 days. If this improves her symptoms, would recommend repeat Lipid Panel and LFT's in 6 weeks.    Medication Adjustments/Labs and Tests Ordered: Current medicines are reviewed at length with the patient today.  Concerns regarding medicines are outlined above.  Medication changes, Labs and Tests ordered today are listed in the Patient Instructions below. Patient Instructions  Medication Instructions:  Please STOP pravastatin Please START Crestor 10 mg once daily  Labwork: None  Testing/Procedures: None  Follow-Up: Your physician wants you to follow-up in: 6 months w/ Dr. Fletcher Anon.  You will receive a reminder letter in the mail two months in advance.  If you don't receive a letter, please call our office to schedule the follow-up appointment.  If you need a refill on your cardiac medications before your next appointment, please call your pharmacy.      Signed, Erma Heritage, PA  12/29/2015 3:31 PM    [facility] [place], [place], [place]  [address] Phone: [phone]; Fax: [phone]  [address] [place], [place] [address] Phone: [phone]

## 2015-12-30 ENCOUNTER — Telehealth: Payer: Self-pay | Admitting: Student

## 2015-12-30 NOTE — Telephone Encounter (Signed)
Clearance/Risk Assessment Request faxed to [facility]. Original placed in "Faxed" bin on Binta Statzer's desk.

## 2016-01-03 ENCOUNTER — Encounter: Payer: Self-pay | Admitting: *Deleted

## 2016-01-04 ENCOUNTER — Ambulatory Visit: Payer: PPO | Admitting: Anesthesiology

## 2016-01-04 ENCOUNTER — Encounter
Admission: RE | Disposition: A | Discharge status: Home / Self Care | Payer: Self-pay | Source: Ambulatory Visit | Attending: Gastroenterology

## 2016-01-04 ENCOUNTER — Ambulatory Visit
Admission: RE | Admit: 2016-01-04 | Discharge: 2016-01-04 | Disposition: A | Discharge status: Home / Self Care | Payer: PPO | Source: Ambulatory Visit | Attending: Gastroenterology | Admitting: Gastroenterology

## 2016-01-04 DIAGNOSIS — M479 Spondylosis, unspecified: Secondary | ICD-10-CM | POA: Insufficient documentation

## 2016-01-04 DIAGNOSIS — I739 Peripheral vascular disease, unspecified: Secondary | ICD-10-CM | POA: Insufficient documentation

## 2016-01-04 DIAGNOSIS — Z7982 Long term (current) use of aspirin: Secondary | ICD-10-CM | POA: Insufficient documentation

## 2016-01-04 DIAGNOSIS — I10 Essential (primary) hypertension: Secondary | ICD-10-CM | POA: Insufficient documentation

## 2016-01-04 DIAGNOSIS — M19042 Primary osteoarthritis, left hand: Secondary | ICD-10-CM | POA: Insufficient documentation

## 2016-01-04 DIAGNOSIS — K649 Unspecified hemorrhoids: Secondary | ICD-10-CM | POA: Diagnosis not present

## 2016-01-04 DIAGNOSIS — K52831 Collagenous colitis: Secondary | ICD-10-CM | POA: Insufficient documentation

## 2016-01-04 DIAGNOSIS — Z6820 Body mass index (BMI) 20.0-20.9, adult: Secondary | ICD-10-CM | POA: Insufficient documentation

## 2016-01-04 DIAGNOSIS — K5730 Diverticulosis of large intestine without perforation or abscess without bleeding: Secondary | ICD-10-CM | POA: Diagnosis not present

## 2016-01-04 DIAGNOSIS — I4891 Unspecified atrial fibrillation: Secondary | ICD-10-CM | POA: Diagnosis not present

## 2016-01-04 DIAGNOSIS — J449 Chronic obstructive pulmonary disease, unspecified: Secondary | ICD-10-CM | POA: Insufficient documentation

## 2016-01-04 DIAGNOSIS — Z79899 Other long term (current) drug therapy: Secondary | ICD-10-CM | POA: Diagnosis not present

## 2016-01-04 DIAGNOSIS — R634 Abnormal weight loss: Secondary | ICD-10-CM | POA: Diagnosis not present

## 2016-01-04 DIAGNOSIS — K625 Hemorrhage of anus and rectum: Secondary | ICD-10-CM | POA: Insufficient documentation

## 2016-01-04 DIAGNOSIS — E785 Hyperlipidemia, unspecified: Secondary | ICD-10-CM | POA: Diagnosis not present

## 2016-01-04 DIAGNOSIS — K219 Gastro-esophageal reflux disease without esophagitis: Secondary | ICD-10-CM | POA: Insufficient documentation

## 2016-01-04 DIAGNOSIS — E039 Hypothyroidism, unspecified: Secondary | ICD-10-CM | POA: Diagnosis not present

## 2016-01-04 DIAGNOSIS — Z853 Personal history of malignant neoplasm of breast: Secondary | ICD-10-CM | POA: Insufficient documentation

## 2016-01-04 DIAGNOSIS — K5790 Diverticulosis of intestine, part unspecified, without perforation or abscess without bleeding: Secondary | ICD-10-CM | POA: Diagnosis not present

## 2016-01-04 DIAGNOSIS — R194 Change in bowel habit: Secondary | ICD-10-CM | POA: Diagnosis not present

## 2016-01-04 DIAGNOSIS — K640 First degree hemorrhoids: Secondary | ICD-10-CM | POA: Insufficient documentation

## 2016-01-04 DIAGNOSIS — R197 Diarrhea, unspecified: Secondary | ICD-10-CM | POA: Diagnosis not present

## 2016-01-04 DIAGNOSIS — M19041 Primary osteoarthritis, right hand: Secondary | ICD-10-CM | POA: Insufficient documentation

## 2016-01-04 DIAGNOSIS — K5289 Other specified noninfective gastroenteritis and colitis: Secondary | ICD-10-CM | POA: Diagnosis not present

## 2016-01-04 HISTORY — DX: Other intervertebral disc degeneration, lumbar region: M51.36

## 2016-01-04 HISTORY — PX: COLONOSCOPY WITH PROPOFOL: SHX5780

## 2016-01-04 HISTORY — DX: Other intervertebral disc degeneration, lumbar region without mention of lumbar back pain or lower extremity pain: M51.369

## 2016-01-04 SURGERY — COLONOSCOPY WITH PROPOFOL
Anesthesia: General

## 2016-01-04 MED ORDER — METOPROLOL TARTRATE 50 MG PO TABS
100.0000 mg | ORAL_TABLET | Freq: Once | ORAL | Status: AC
  Administered 2016-01-04: 100 mg via ORAL

## 2016-01-04 MED ORDER — SODIUM CHLORIDE 0.9 % IV SOLN: INTRAVENOUS | Status: DC

## 2016-01-04 MED ORDER — PROPOFOL 500 MG/50ML IV EMUL
INTRAVENOUS | Status: DC | PRN
  Administered 2016-01-04: 50 ug/kg/min via INTRAVENOUS

## 2016-01-04 MED ORDER — METOPROLOL TARTRATE 50 MG PO TABS
ORAL_TABLET | ORAL | Status: AC
  Filled 2016-01-04: qty 2

## 2016-01-04 MED ORDER — SODIUM CHLORIDE 0.9 % IV SOLN
INTRAVENOUS | Status: DC
  Administered 2016-01-04: 09:00:00 via INTRAVENOUS
  Administered 2016-01-04: 1000 mL via INTRAVENOUS

## 2016-01-04 NOTE — Transfer of Care (Signed)
Immediate Anesthesia Transfer of Care Note  Patient: [name]  Procedure(s) Performed: Procedure(s): COLONOSCOPY WITH PROPOFOL (N/A)  Patient Location: PACU  Anesthesia Type:General  Level of Consciousness: sedated  Airway & Oxygen Therapy: Patient Spontanous Breathing and Patient connected to nasal cannula oxygen  Post-op Assessment: Report given to RN and Post -op Vital signs reviewed and stable  Post vital signs: Reviewed and stable  Last Vitals:  Vitals:   01/04/16 0823 01/04/16 0900  BP: (!) 184/58 120/73  Pulse: 62 (!) 55  Resp: 17 18  Temp: (!) 36 C (!) 36.1 C    Last Pain:  Vitals:   01/04/16 0900  TempSrc: Tympanic         Complications: No apparent anesthesia complications

## 2016-01-04 NOTE — Op Note (Signed)
[facility] Patient Name: [name] Procedure Date: 01/04/2016 9:08 AM MRN: [id number] Account #: [id number] Date of Birth: 08-31-1932 Admit Type: Outpatient Age: 82 Room: [facility] ENDO ROOM 3 Gender: Female Note Status: Finalized Procedure:            Colonoscopy Indications:          Chronic diarrhea, Rectal bleeding, Weight loss Providers:            Lollie Sails, MD Referring MD:         Rusty Aus, MD (Referring MD) Medicines:            Monitored Anesthesia Care Complications:        No immediate complications, note above. Procedure:            Pre-Anesthesia Assessment:                       - ASA Grade Assessment: III - A patient with severe                        systemic disease.                       After obtaining informed consent, the colonoscope was                        passed under direct vision. Throughout the procedure,                        the patient's blood pressure, pulse, and oxygen                        saturations were monitored continuously. The                        Colonoscope was introduced through the anus and                        advanced to the the cecum, identified by appendiceal                        orifice and ileocecal valve. The colonoscopy was                        unusually difficult due to multiple diverticula in the                        colon and a tortuous colon. Successful completion of                        the procedure was aided by changing the patient to a                        supine position, changing the patient to a prone                        position and using manual pressure. The patient                        tolerated the procedure well. The quality of the bowel  preparation was good. Findings:      Multiple small to medium diverticula were found in the sigmoid colon,       descending colon and transverse colon.      The sigmoid colon, descending  colon and transverse colon were       significantly tortuous.      2 mucosal breaks are noted in the proximal ascending, without defect to       the underlying layers noted with good hemostasis, likely related to       scope passage      Non-bleeding internal hemorrhoids were found during anoscopy. The       hemorrhoids were small and Grade I (internal hemorrhoids that do not       prolapse).      I was unable to retroflex in the rectal vault due to small size of the       lumen.      Biopsies for histology were taken with a cold forceps from the right       colon and left colon for evaluation of microscopic colitis. Impression:           - Diverticulosis in the sigmoid colon, in the                        descending colon and in the transverse colon.                       - Tortuous colon.                       - Non-bleeding internal hemorrhoids.                       - No specimens collected. Recommendation:       - Discharge patient to home.                       - Full liquid diet for 2 days, then advance as                        tolerated to soft diet for 2 days. Procedure Code(s):    --- Professional ---                       [phone], Colonoscopy, flexible; with biopsy, single or                        multiple Diagnosis Code(s):    --- Professional ---                       K64.0, First degree hemorrhoids                       K52.9, Noninfective gastroenteritis and colitis,                        unspecified                       K62.5, Hemorrhage of anus and rectum                       R63.4, Abnormal weight loss  K57.30, Diverticulosis of large intestine without                        perforation or abscess without bleeding                       Q43.8, Other specified congenital malformations of                        intestine CPT copyright 2016 American Medical Association. All rights reserved. The codes documented in this report are preliminary and  upon coder review may  be revised to meet current compliance requirements. Lollie Sails, MD 01/04/2016 10:02:52 AM This report has been signed electronically. Number of Addenda: 0 Note Initiated On: 01/04/2016 9:08 AM Scope Withdrawal Time: 0 hours 8 minutes 13 seconds  Total Procedure Duration: 0 hours 31 minutes 4 seconds       [facility]

## 2016-01-04 NOTE — Anesthesia Preprocedure Evaluation (Signed)
Anesthesia Evaluation  Patient identified by MRN, date of birth, ID band  Reviewed: Allergy & Precautions, NPO status , Patient's Chart, lab work & pertinent test results  History of Anesthesia Complications (+) PONV  Airway Mallampati: II       Dental  (+) Partial Lower   Pulmonary COPD,  COPD inhaler,           Cardiovascular hypertension, Pt. on medications and Pt. on home beta blockers + Peripheral Vascular Disease and +CHF  + dysrhythmias Atrial Fibrillation      Neuro/Psych negative neurological ROS     GI/Hepatic Neg liver ROS, GERD  Medicated,  Endo/Other  Hypothyroidism   Renal/GU Renal InsufficiencyRenal disease     Musculoskeletal  (+) Arthritis , Osteoarthritis,    Abdominal   Peds  Hematology negative hematology ROS (+)   Anesthesia Other Findings   Reproductive/Obstetrics                             Anesthesia Physical Anesthesia Plan  ASA: III  Anesthesia Plan: General   Post-op Pain Management:    Induction: Intravenous  Airway Management Planned: Nasal Cannula  Additional Equipment:   Intra-op Plan:   Post-operative Plan:   Informed Consent: I have reviewed the patients History and Physical, chart, labs and discussed the procedure including the risks, benefits and alternatives for the proposed anesthesia with the patient or authorized representative who has indicated his/her understanding and acceptance.     Plan Discussed with:   Anesthesia Plan Comments:         Anesthesia Quick Evaluation

## 2016-01-04 NOTE — H&P (Signed)
Outpatient short stay form Pre-procedure 01/04/2016 9:04 AM Latoya Sails MD  Primary Physician: Dr. Emily Filbert  Reason for visit:  Colonoscopy  History of present illness:  Patient is a 82 year old female presenting today as above. She has a history of change in bowel habits with loose stools for about a year. She is also seeing some rectal bleeding. He has had some weight loss. He does have a history of peripheral vascular disease and had previously been on Plavix however stopped that about a month ago. She continues on 81 mg aspirin daily the last dose about 4 days ago. She states she only took about a third of her prep however rectal examination this morning showed no formed stool. She takes no other aspirin or blood thinning agents.    Current Facility-Administered Medications:  .  0.9 %  sodium chloride infusion, , Intravenous, Continuous, Latoya Sails, MD, Last Rate: 20 mL/hr at 01/04/16 0837, 1,000 mL at 01/04/16 0837 .  0.9 %  sodium chloride infusion, , Intravenous, Continuous, Latoya Sails, MD .  metoprolol (LOPRESSOR) 50 MG tablet, , , ,   Prescriptions Prior to Admission  Medication Sig Dispense Refill Last Dose  . acetaminophen (TYLENOL) 500 MG tablet Take 500 mg by mouth every 6 (six) hours as needed (For pain.).   01/03/2016 at Unknown time  . amiodarone (PACERONE) 200 MG tablet Take 1 tablet (200 mg total) by mouth daily. 90 tablet 3 01/03/2016 at Unknown time  . Ascorbic Acid (VITAMIN C PO) Take 1 tablet by mouth daily.    01/03/2016 at Unknown time  . aspirin EC 81 MG tablet Take 1 tablet (81 mg total) by mouth daily. 90 tablet 3 Past Week at Unknown time  . calcium carbonate (OSCAL) 1500 (600 Ca) MG TABS tablet Take 600 mg by mouth 2 (two) times daily with a meal.   Past Week at Unknown time  . calcium-vitamin D (OSCAL WITH D) 500-200 MG-UNIT per tablet Take 1 tablet by mouth daily with breakfast.   Past Week at Unknown time  . Cholecalciferol (VITAMIN D-3 PO)  Take 1 tablet by mouth daily.   Past Week at Unknown time  . dicyclomine (BENTYL) 10 MG/5ML syrup Take 10 mg by mouth 4 (four) times daily -  before meals and at bedtime.   01/03/2016 at Unknown time  . diltiazem (CARDIZEM SR) 90 MG 12 hr capsule Take 90 mg by mouth 2 (two) times daily.   01/03/2016 at Unknown time  . docusate sodium (COLACE) 100 MG capsule Take 100 mg by mouth daily as needed for mild constipation.   01/03/2016 at Unknown time  . Iron-Vitamin C 65-125 MG TABS Take 1 tablet by mouth daily.   Past Week at Unknown time  . lisinopril (PRINIVIL,ZESTRIL) 10 MG tablet Take 10 mg by mouth daily.   01/03/2016 at Unknown time  . metoprolol (LOPRESSOR) 100 MG tablet Take 0.5 tablets (50 mg total) by mouth 2 (two) times daily. 30 tablet 11 01/03/2016 at Unknown time  . Multiple Vitamin (MULTIVITAMIN) tablet Take 1 tablet by mouth daily.     Past Week at Unknown time  . omeprazole (PRILOSEC) 40 MG capsule Take 40 mg by mouth daily.   01/03/2016 at Unknown time  . pantoprazole (PROTONIX) 40 MG tablet Take 1 tablet (40 mg total) by mouth daily. 30 tablet 0 01/03/2016 at Unknown time  . potassium chloride (KLOR-CON) 10 MEQ CR tablet Take 20 mEq by mouth daily. PT TAKES TWO EXTRA  TABLETS WITH EXTRA FLUID PILL   Past Week at Unknown time  . pravastatin (PRAVACHOL) 40 MG tablet Take 40 mg by mouth daily.   01/03/2016 at Unknown time  . rosuvastatin (CRESTOR) 10 MG tablet Take 1 tablet (10 mg total) by mouth daily. 30 tablet 6 01/03/2016 at Unknown time  . spironolactone (ALDACTONE) 25 MG tablet Take 25 mg by mouth daily.   01/03/2016 at Unknown time  . tamoxifen (SOLTAMOX) 10 MG/5ML solution Take 20 mg by mouth daily.   01/03/2016 at Unknown time  . temazepam (RESTORIL) 15 MG capsule Take 15 mg by mouth at bedtime as needed for sleep.   5 Past Week at Unknown time  . torsemide (DEMADEX) 20 MG tablet Take 1 tablet (20 mg total) by mouth daily. 60 tablet 3 01/03/2016 at Unknown time  . traMADol (ULTRAM) 50 MG  tablet Take 50 mg by mouth every 6 (six) hours as needed.   Past Week at Unknown time  . venlafaxine (EFFEXOR-XR) 75 MG 24 hr capsule Take 75 mg by mouth daily.     Past Week at Unknown time  . levothyroxine (SYNTHROID, LEVOTHROID) 150 MCG tablet Take 150 mcg by mouth daily before breakfast.    Taking     Allergies  Allergen Reactions  . Amlodipine Nausea And Vomiting  . Ciprofloxacin Nausea Only  . Doxycycline Itching and Other (See Comments)    shaky  . Lipitor [Atorvastatin] Other (See Comments)    Joint pain   . Morphine And Related Nausea And Vomiting  . Macrobid [Nitrofurantoin Macrocrystal] Rash  . Penicillins Rash and Other (See Comments)    Has patient had a PCN reaction causing immediate rash, facial/tongue/throat swelling, SOB or lightheadedness with hypotension: yes Has patient had a PCN reaction causing severe rash involving mucus membranes or skin necrosis: no Has patient had a PCN reaction that required hospitalization no Has patient had a PCN reaction occurring within the last 10 years: no If all of the above answers are "NO", then may proceed with Cephalosporin use.   . Sulfamethoxazole-Trimethoprim Rash     Past Medical History:  Diagnosis Date  . Arthritis    "back, hands" (09/08/2015)  . Atrial fibrillation ([place])    Echocardiogram 5/12: normal LV function, moderate LAE, mild RAE, mild AI and MR and mild-to-moderate TR.  Myoview 5/ 12 showed an ejection fraction of 73% and normal perfusion.   Patient underwent elective DCCV on 08/26/10  . Breast cancer ([place]) 2007, 2013   right breast- 2007 radiation  . Cancer of right breast ([place])   . Chronic lower back pain   . Complication of anesthesia    "took me about 1 week to know what was going on after one of my knee ORs"  . COPD (chronic obstructive pulmonary disease) ([place])   . DDD (degenerative disc disease), lumbar   . 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 Dysrhythmia   . GERD  (gastroesophageal reflux disease)   . Hyperlipidemia   . Hypertension   . Hypothyroid   . PAD (peripheral artery disease) ([place])   . PVD (peripheral vascular disease) ([place])     Review of systems:      Physical Exam    Heart and lungs: Regular rate and rhythm without rub or gallop    HEENT: Normocephalic atraumatic eyes are anicteric    Other:     Pertinant exam for procedure: Soft nontender nondistended bowel sounds positive normoactive Digital rectal examination normal  Planned proceedures: Colonoscopy and indicated procedures. I have discussed the risks benefits and complications of procedures to include not limited to bleeding, infection, perforation and the risk of sedation and the patient wishes to proceed.    Latoya Sails, MD Gastroenterology 01/04/2016  9:04 AM

## 2016-01-04 NOTE — Anesthesia Postprocedure Evaluation (Signed)
Anesthesia Post Note  Patient: [name]  Procedure(s) Performed: Procedure(s) (LRB): COLONOSCOPY WITH PROPOFOL (N/A)  Patient location during evaluation: Endoscopy Anesthesia Type: General Level of consciousness: awake and alert Pain management: pain level controlled Vital Signs Assessment: post-procedure vital signs reviewed and stable Respiratory status: spontaneous breathing and respiratory function stable Cardiovascular status: stable Anesthetic complications: no    Last Vitals:  Vitals:   01/04/16 0823 01/04/16 0900  BP: (!) 184/58 120/73  Pulse: 62 (!) 55  Resp: 17 18  Temp: (!) 36 C (!) 36.1 C    Last Pain:  Vitals:   01/04/16 0900  TempSrc: Tympanic                 KEPHART,WILLIAM K

## 2016-01-05 ENCOUNTER — Encounter: Payer: Self-pay | Admitting: Gastroenterology

## 2016-01-05 LAB — SURGICAL PATHOLOGY

## 2016-01-11 DIAGNOSIS — R55 Syncope and collapse: Secondary | ICD-10-CM | POA: Diagnosis not present

## 2016-01-12 ENCOUNTER — Emergency Department: Payer: PPO

## 2016-01-12 ENCOUNTER — Inpatient Hospital Stay (HOSPITAL_COMMUNITY)
Admit: 2016-01-12 | Discharge: 2016-01-12 | Disposition: A | Discharge status: Home / Self Care | Payer: PPO | Attending: Internal Medicine | Admitting: Internal Medicine

## 2016-01-12 ENCOUNTER — Encounter: Payer: Self-pay | Admitting: Emergency Medicine

## 2016-01-12 ENCOUNTER — Inpatient Hospital Stay
Admission: EM | Admit: 2016-01-12 | Discharge: 2016-01-14 | DRG: 682 | Disposition: A | Discharge status: Home Health | Payer: PPO | Attending: Internal Medicine | Admitting: Internal Medicine

## 2016-01-12 DIAGNOSIS — S299XXA Unspecified injury of thorax, initial encounter: Secondary | ICD-10-CM | POA: Diagnosis not present

## 2016-01-12 DIAGNOSIS — R001 Bradycardia, unspecified: Secondary | ICD-10-CM | POA: Diagnosis present

## 2016-01-12 DIAGNOSIS — G8929 Other chronic pain: Secondary | ICD-10-CM | POA: Diagnosis not present

## 2016-01-12 DIAGNOSIS — I739 Peripheral vascular disease, unspecified: Secondary | ICD-10-CM | POA: Diagnosis present

## 2016-01-12 DIAGNOSIS — M545 Low back pain: Secondary | ICD-10-CM | POA: Diagnosis not present

## 2016-01-12 DIAGNOSIS — I482 Chronic atrial fibrillation: Secondary | ICD-10-CM | POA: Diagnosis present

## 2016-01-12 DIAGNOSIS — I5032 Chronic diastolic (congestive) heart failure: Secondary | ICD-10-CM | POA: Diagnosis present

## 2016-01-12 DIAGNOSIS — J449 Chronic obstructive pulmonary disease, unspecified: Secondary | ICD-10-CM | POA: Diagnosis not present

## 2016-01-12 DIAGNOSIS — E43 Unspecified severe protein-calorie malnutrition: Secondary | ICD-10-CM | POA: Diagnosis not present

## 2016-01-12 DIAGNOSIS — N170 Acute kidney failure with tubular necrosis: Principal | ICD-10-CM | POA: Diagnosis present

## 2016-01-12 DIAGNOSIS — W1830XA Fall on same level, unspecified, initial encounter: Secondary | ICD-10-CM | POA: Diagnosis present

## 2016-01-12 DIAGNOSIS — Y92009 Unspecified place in unspecified non-institutional (private) residence as the place of occurrence of the external cause: Secondary | ICD-10-CM

## 2016-01-12 DIAGNOSIS — R55 Syncope and collapse: Secondary | ICD-10-CM

## 2016-01-12 DIAGNOSIS — Z803 Family history of malignant neoplasm of breast: Secondary | ICD-10-CM | POA: Diagnosis not present

## 2016-01-12 DIAGNOSIS — Z881 Allergy status to other antibiotic agents status: Secondary | ICD-10-CM

## 2016-01-12 DIAGNOSIS — S01511A Laceration without foreign body of lip, initial encounter: Secondary | ICD-10-CM | POA: Diagnosis not present

## 2016-01-12 DIAGNOSIS — K52831 Collagenous colitis: Secondary | ICD-10-CM | POA: Diagnosis not present

## 2016-01-12 DIAGNOSIS — K219 Gastro-esophageal reflux disease without esophagitis: Secondary | ICD-10-CM | POA: Diagnosis present

## 2016-01-12 DIAGNOSIS — I951 Orthostatic hypotension: Secondary | ICD-10-CM | POA: Diagnosis not present

## 2016-01-12 DIAGNOSIS — Z96651 Presence of right artificial knee joint: Secondary | ICD-10-CM | POA: Diagnosis present

## 2016-01-12 DIAGNOSIS — Z880 Allergy status to penicillin: Secondary | ICD-10-CM

## 2016-01-12 DIAGNOSIS — I480 Paroxysmal atrial fibrillation: Secondary | ICD-10-CM | POA: Diagnosis not present

## 2016-01-12 DIAGNOSIS — R197 Diarrhea, unspecified: Secondary | ICD-10-CM | POA: Diagnosis present

## 2016-01-12 DIAGNOSIS — Z882 Allergy status to sulfonamides status: Secondary | ICD-10-CM

## 2016-01-12 DIAGNOSIS — E785 Hyperlipidemia, unspecified: Secondary | ICD-10-CM | POA: Diagnosis present

## 2016-01-12 DIAGNOSIS — I10 Essential (primary) hypertension: Secondary | ICD-10-CM | POA: Diagnosis not present

## 2016-01-12 DIAGNOSIS — Z853 Personal history of malignant neoplasm of breast: Secondary | ICD-10-CM | POA: Diagnosis not present

## 2016-01-12 DIAGNOSIS — E860 Dehydration: Secondary | ICD-10-CM | POA: Diagnosis not present

## 2016-01-12 DIAGNOSIS — I110 Hypertensive heart disease with heart failure: Secondary | ICD-10-CM | POA: Diagnosis present

## 2016-01-12 DIAGNOSIS — R42 Dizziness and giddiness: Secondary | ICD-10-CM | POA: Diagnosis not present

## 2016-01-12 DIAGNOSIS — S199XXA Unspecified injury of neck, initial encounter: Secondary | ICD-10-CM | POA: Diagnosis not present

## 2016-01-12 DIAGNOSIS — R220 Localized swelling, mass and lump, head: Secondary | ICD-10-CM | POA: Diagnosis not present

## 2016-01-12 DIAGNOSIS — Z681 Body mass index (BMI) 19 or less, adult: Secondary | ICD-10-CM

## 2016-01-12 DIAGNOSIS — Z8249 Family history of ischemic heart disease and other diseases of the circulatory system: Secondary | ICD-10-CM

## 2016-01-12 DIAGNOSIS — N179 Acute kidney failure, unspecified: Secondary | ICD-10-CM | POA: Diagnosis present

## 2016-01-12 DIAGNOSIS — M6281 Muscle weakness (generalized): Secondary | ICD-10-CM

## 2016-01-12 DIAGNOSIS — E039 Hypothyroidism, unspecified: Secondary | ICD-10-CM | POA: Diagnosis present

## 2016-01-12 DIAGNOSIS — K5790 Diverticulosis of intestine, part unspecified, without perforation or abscess without bleeding: Secondary | ICD-10-CM | POA: Diagnosis present

## 2016-01-12 DIAGNOSIS — L8990 Pressure ulcer of unspecified site, unspecified stage: Secondary | ICD-10-CM | POA: Diagnosis present

## 2016-01-12 DIAGNOSIS — Z888 Allergy status to other drugs, medicaments and biological substances status: Secondary | ICD-10-CM

## 2016-01-12 DIAGNOSIS — Z23 Encounter for immunization: Secondary | ICD-10-CM

## 2016-01-12 DIAGNOSIS — Z885 Allergy status to narcotic agent status: Secondary | ICD-10-CM

## 2016-01-12 LAB — BASIC METABOLIC PANEL
Anion gap: 11 (ref 5–15)
BUN: 43 mg/dL — ABNORMAL HIGH (ref 6–20)
CHLORIDE: 101 mmol/L (ref 101–111)
CO2: 25 mmol/L (ref 22–32)
CREATININE: 3.19 mg/dL — AB (ref 0.44–1.00)
Calcium: 9.2 mg/dL (ref 8.9–10.3)
GFR calc non Af Amer: 13 mL/min — ABNORMAL LOW (ref >60)
GFR, EST AFRICAN AMERICAN: 15 mL/min — AB (ref >60)
Glucose, Bld: 120 mg/dL — ABNORMAL HIGH (ref 65–99)
POTASSIUM: 3.9 mmol/L (ref 3.5–5.1)
SODIUM: 137 mmol/L (ref 135–145)

## 2016-01-12 LAB — CBC
HCT: 36.6 % (ref 35.0–47.0)
Hemoglobin: 12.9 g/dL (ref 12.0–16.0)
MCH: 33.7 pg (ref 26.0–34.0)
MCHC: 35.3 g/dL (ref 32.0–36.0)
MCV: 95.3 fL (ref 80.0–100.0)
PLATELETS: 258 10*3/uL (ref 150–440)
RBC: 3.84 MIL/uL (ref 3.80–5.20)
RDW: 13.4 % (ref 11.5–14.5)
WBC: 11 10*3/uL (ref 3.6–11.0)

## 2016-01-12 LAB — TSH: TSH: 0.918 u[IU]/mL (ref 0.350–4.500)

## 2016-01-12 LAB — ECHOCARDIOGRAM COMPLETE
Height: 68 in
Weight: 2088 oz

## 2016-01-12 LAB — TROPONIN I: Troponin I: <0.03 ng/mL (ref <0.03)

## 2016-01-12 MED ORDER — TEMAZEPAM 7.5 MG PO CAPS: 15.0000 mg | ORAL_CAPSULE | Freq: Every evening | ORAL | Status: DC | PRN

## 2016-01-12 MED ORDER — DOCUSATE SODIUM 100 MG PO CAPS
100.0000 mg | ORAL_CAPSULE | Freq: Two times a day (BID) | ORAL | Status: DC
  Filled 2016-01-12: qty 1

## 2016-01-12 MED ORDER — CHLORHEXIDINE GLUCONATE 0.12 % MT SOLN
15.0000 mL | Freq: Two times a day (BID) | OROMUCOSAL | Status: DC
  Administered 2016-01-12 – 2016-01-13 (×4): 15 mL via OROMUCOSAL
  Filled 2016-01-12 (×5): qty 15

## 2016-01-12 MED ORDER — PANTOPRAZOLE SODIUM 40 MG PO TBEC
40.0000 mg | DELAYED_RELEASE_TABLET | Freq: Every day | ORAL | Status: DC
  Administered 2016-01-12 – 2016-01-14 (×3): 40 mg via ORAL
  Filled 2016-01-12 (×3): qty 1

## 2016-01-12 MED ORDER — MORPHINE SULFATE (PF) 2 MG/ML IV SOLN
2.0000 mg | Freq: Once | INTRAVENOUS | Status: AC
  Administered 2016-01-12: 2 mg via INTRAVENOUS
  Filled 2016-01-12: qty 1

## 2016-01-12 MED ORDER — VENLAFAXINE HCL ER 75 MG PO CP24
75.0000 mg | ORAL_CAPSULE | Freq: Every day | ORAL | Status: DC
  Administered 2016-01-12 – 2016-01-14 (×3): 75 mg via ORAL
  Filled 2016-01-12 (×3): qty 1

## 2016-01-12 MED ORDER — HYDRALAZINE HCL 25 MG PO TABS
25.0000 mg | ORAL_TABLET | Freq: Three times a day (TID) | ORAL | Status: DC
  Administered 2016-01-13 – 2016-01-14 (×4): 25 mg via ORAL
  Filled 2016-01-12 (×4): qty 1

## 2016-01-12 MED ORDER — FERROUS SULFATE 325 (65 FE) MG PO TABS
325.0000 mg | ORAL_TABLET | Freq: Every day | ORAL | Status: DC
  Administered 2016-01-12 – 2016-01-14 (×3): 325 mg via ORAL
  Filled 2016-01-12 (×3): qty 1

## 2016-01-12 MED ORDER — ASPIRIN EC 81 MG PO TBEC
81.0000 mg | DELAYED_RELEASE_TABLET | Freq: Every day | ORAL | Status: DC
  Administered 2016-01-12 – 2016-01-14 (×3): 81 mg via ORAL
  Filled 2016-01-12 (×3): qty 1

## 2016-01-12 MED ORDER — ACETAMINOPHEN 325 MG PO TABS
650.0000 mg | ORAL_TABLET | Freq: Four times a day (QID) | ORAL | Status: DC | PRN
  Administered 2016-01-12 (×2): 650 mg via ORAL
  Filled 2016-01-12 (×3): qty 2

## 2016-01-12 MED ORDER — ACETAMINOPHEN 650 MG RE SUPP: 650.0000 mg | Freq: Four times a day (QID) | RECTAL | Status: DC | PRN

## 2016-01-12 MED ORDER — HEPARIN SODIUM (PORCINE) 5000 UNIT/ML IJ SOLN
5000.0000 [IU] | Freq: Three times a day (TID) | INTRAMUSCULAR | Status: DC
  Administered 2016-01-12 – 2016-01-14 (×6): 5000 [IU] via SUBCUTANEOUS
  Filled 2016-01-12 (×7): qty 1

## 2016-01-12 MED ORDER — SODIUM CHLORIDE 0.9 % IV SOLN
INTRAVENOUS | Status: DC
Start: 2016-01-12 — End: 2016-01-14
  Administered 2016-01-12 – 2016-01-14 (×5): via INTRAVENOUS

## 2016-01-12 MED ORDER — LISINOPRIL 10 MG PO TABS: 10.0000 mg | ORAL_TABLET | Freq: Every day | ORAL | Status: DC

## 2016-01-12 MED ORDER — INFLUENZA VAC SPLIT QUAD 0.5 ML IM SUSY
0.5000 mL | PREFILLED_SYRINGE | INTRAMUSCULAR | Status: AC
  Administered 2016-01-13: 0.5 mL via INTRAMUSCULAR
  Filled 2016-01-12: qty 0.5

## 2016-01-12 MED ORDER — LIDOCAINE HCL (PF) 1 % IJ SOLN
INTRAMUSCULAR | Status: AC
  Administered 2016-01-12: 5 mL via INTRADERMAL
  Filled 2016-01-12: qty 5

## 2016-01-12 MED ORDER — ENSURE ENLIVE PO LIQD
237.0000 mL | Freq: Three times a day (TID) | ORAL | Status: DC
  Administered 2016-01-12 – 2016-01-14 (×5): 237 mL via ORAL

## 2016-01-12 MED ORDER — LIDOCAINE HCL (PF) 1 % IJ SOLN
5.0000 mL | Freq: Once | INTRAMUSCULAR | Status: AC
  Administered 2016-01-12: 5 mL via INTRADERMAL

## 2016-01-12 MED ORDER — AMIODARONE HCL 200 MG PO TABS
200.0000 mg | ORAL_TABLET | Freq: Every day | ORAL | Status: DC
  Administered 2016-01-14: 200 mg via ORAL
  Filled 2016-01-12 (×3): qty 1

## 2016-01-12 MED ORDER — TORSEMIDE 20 MG PO TABS: 20.0000 mg | ORAL_TABLET | Freq: Every day | ORAL | Status: DC

## 2016-01-12 MED ORDER — HYDRALAZINE HCL 20 MG/ML IJ SOLN
10.0000 mg | Freq: Four times a day (QID) | INTRAMUSCULAR | Status: DC | PRN
  Filled 2016-01-12: qty 1

## 2016-01-12 MED ORDER — PNEUMOCOCCAL VAC POLYVALENT 25 MCG/0.5ML IJ INJ
0.5000 mL | INJECTION | INTRAMUSCULAR | Status: AC
  Administered 2016-01-13: 09:00:00 0.5 mL via INTRAMUSCULAR
  Filled 2016-01-12: qty 0.5

## 2016-01-12 MED ORDER — ONDANSETRON HCL 4 MG/2ML IJ SOLN
4.0000 mg | Freq: Once | INTRAMUSCULAR | Status: AC
  Administered 2016-01-12: 4 mg via INTRAVENOUS

## 2016-01-12 MED ORDER — CALCIUM CARBONATE-VITAMIN D 500-200 MG-UNIT PO TABS
1.0000 | ORAL_TABLET | Freq: Every day | ORAL | Status: DC
  Administered 2016-01-12 – 2016-01-14 (×3): 1 via ORAL
  Filled 2016-01-12 (×3): qty 1

## 2016-01-12 MED ORDER — ONDANSETRON HCL 4 MG PO TABS: 4.0000 mg | ORAL_TABLET | Freq: Four times a day (QID) | ORAL | Status: DC | PRN

## 2016-01-12 MED ORDER — ROSUVASTATIN CALCIUM 5 MG PO TABS
10.0000 mg | ORAL_TABLET | Freq: Every day | ORAL | Status: DC
  Administered 2016-01-12 – 2016-01-14 (×3): 10 mg via ORAL
  Filled 2016-01-12 (×3): qty 2

## 2016-01-12 MED ORDER — POTASSIUM CHLORIDE CRYS ER 20 MEQ PO TBCR
20.0000 meq | EXTENDED_RELEASE_TABLET | Freq: Every day | ORAL | Status: DC
  Administered 2016-01-12 – 2016-01-14 (×3): 20 meq via ORAL
  Filled 2016-01-12 (×3): qty 1

## 2016-01-12 MED ORDER — ONDANSETRON HCL 4 MG/2ML IJ SOLN
4.0000 mg | Freq: Once | INTRAMUSCULAR | Status: AC
Start: 2016-01-12 — End: 2016-01-12
  Administered 2016-01-12: 4 mg via INTRAVENOUS
  Filled 2016-01-12: qty 2

## 2016-01-12 MED ORDER — OXYCODONE-ACETAMINOPHEN 5-325 MG PO TABS
1.0000 | ORAL_TABLET | Freq: Four times a day (QID) | ORAL | Status: DC | PRN
  Administered 2016-01-12 – 2016-01-14 (×5): 1 via ORAL
  Filled 2016-01-12 (×5): qty 1

## 2016-01-12 MED ORDER — LEVOTHYROXINE SODIUM 150 MCG PO TABS
150.0000 ug | ORAL_TABLET | Freq: Every day | ORAL | Status: DC
  Administered 2016-01-12 – 2016-01-14 (×3): 150 ug via ORAL
  Filled 2016-01-12 (×3): qty 1

## 2016-01-12 MED ORDER — ONDANSETRON HCL 4 MG/2ML IJ SOLN
INTRAMUSCULAR | Status: AC
  Administered 2016-01-12: 4 mg via INTRAVENOUS
  Filled 2016-01-12: qty 2

## 2016-01-12 MED ORDER — SODIUM CHLORIDE 0.9% FLUSH
3.0000 mL | Freq: Two times a day (BID) | INTRAVENOUS | Status: DC
Start: 2016-01-12 — End: 2016-01-14
  Administered 2016-01-13: 3 mL via INTRAVENOUS

## 2016-01-12 MED ORDER — LIDOCAINE-EPINEPHRINE (PF) 1 %-1:200000 IJ SOLN
INTRAMUSCULAR | Status: AC
  Filled 2016-01-12: qty 30

## 2016-01-12 MED ORDER — ADULT MULTIVITAMIN W/MINERALS CH
1.0000 | ORAL_TABLET | Freq: Every day | ORAL | Status: DC
  Administered 2016-01-12 – 2016-01-14 (×3): 1 via ORAL
  Filled 2016-01-12 (×3): qty 1

## 2016-01-12 MED ORDER — SPIRONOLACTONE 25 MG PO TABS: 25.0000 mg | ORAL_TABLET | Freq: Every day | ORAL | Status: DC

## 2016-01-12 MED ORDER — BUDESONIDE 3 MG PO CPEP
9.0000 mg | ORAL_CAPSULE | Freq: Every day | ORAL | Status: DC
  Administered 2016-01-13 – 2016-01-14 (×2): 9 mg via ORAL
  Filled 2016-01-12 (×2): qty 3

## 2016-01-12 MED ORDER — VITAMIN D 1000 UNITS PO TABS
1000.0000 [IU] | ORAL_TABLET | Freq: Every day | ORAL | Status: DC
  Administered 2016-01-12 – 2016-01-14 (×3): 1000 [IU] via ORAL
  Filled 2016-01-12 (×3): qty 1

## 2016-01-12 MED ORDER — METOPROLOL TARTRATE 50 MG PO TABS
50.0000 mg | ORAL_TABLET | Freq: Two times a day (BID) | ORAL | Status: DC
  Filled 2016-01-12: qty 1

## 2016-01-12 MED ORDER — VITAMIN C 500 MG PO TABS
500.0000 mg | ORAL_TABLET | Freq: Every day | ORAL | Status: DC
  Administered 2016-01-12 – 2016-01-14 (×3): 500 mg via ORAL
  Filled 2016-01-12 (×3): qty 1

## 2016-01-12 MED ORDER — ONDANSETRON HCL 4 MG/2ML IJ SOLN
4.0000 mg | Freq: Four times a day (QID) | INTRAMUSCULAR | Status: DC | PRN
Start: 2016-01-12 — End: 2016-01-14
  Administered 2016-01-13: 4 mg via INTRAVENOUS
  Filled 2016-01-12: qty 2

## 2016-01-12 NOTE — Progress Notes (Signed)
Admitted this morning because of for syncope, facial laceration. Patient felt a little dizzy. No chest pain. Patient has been having chronic diarrhea for almost 2 months. In by GI. And not eating well. Patient is alert, awake, oriented. Denies any complaints. Heart rate has been 50s.  Vitals: Blood pressure 141/75, heart rate 54 and 55, sats 100% on room air, temperature 97.5 Review of systems patient to denies any chest pain, shortness of breath, nausea, vomiting. Does have chronic diarrhea. Physical examination alert awake oriented, left side of the face is slightly swollen also has sutures. Cardiovascular S1-S2 regular is slightly bradycardic  lungs are clear to auscultation no wheezes no rales  GI abdomen is soft nontender nondistended bowel sounds present Labs: Reviewed. BUN 43/creatinine 3.19, WBC 11.0 Assessment and plan #1 syncope likely secondary to dehydration, acute renal failure with ATN: Continue IV hydration, avoid diuretics, nephrotoxic agents monitor kidney function/check orthostatic vitals #2/paroxysmal atrial fibrillation, now with bradycardia: Hold the amiodarone, metoprolol, consult cardiology #3 chronic diarrhea: May be causing syncope and dehydration and acute renal failure: Seen by GI, started on Bentyl. Stool studies have been negative for C. difficile, . bacteria,, virus, and  Parasites. #4 severe malnutrition: Seen by dietary , change in the diet to dysphagia 3 diet, ensure. #5 deconditioning: Physical therapy consult. #6 uncontrolled hypertension: Better now. Discussed with nurse, husband. Time spent;30 min

## 2016-01-12 NOTE — Care Management Important Message (Signed)
Important Message  Patient Details  Name: [name] MRN: [id number] Date of Birth: 09/19/1932   Medicare Important Message Given:  Yes    Shelbie Ammons, RN 01/12/2016, 9:16 AM

## 2016-01-12 NOTE — ED Notes (Signed)
Patient transported to CT 

## 2016-01-12 NOTE — H&P (Addendum)
[name] is an 82 y.o. female.   Chief Complaint: Fainting HPI: The patient with past medical history of hypertension and atrial fibrillation presents to the emergency department after suffering a fall. The patient's husband states that they have gone to bed when his wife stated that she felt indigestion and wanted to take Tums. She rose from bed and took 2 steps which time she fell. The patient does not recall falling. She denies chest pain or shortness of breath. She admits to having diarrhea for at least 2 months. She has seen a gastroenterologist and undergone colonoscopy recently that showed diverticulosis. Laboratory evaluation in the emergency department revealed acute kidney injury. Following suture repair of her facial wounds emergency department staff called the hospitalist service for further management.  Past Medical History:  Diagnosis Date  . Arthritis    "back, hands" (09/08/2015)  . Atrial fibrillation ([place])    Echocardiogram 5/12: normal LV function, moderate LAE, mild RAE, mild AI and MR and mild-to-moderate TR.  Myoview 5/ 12 showed an ejection fraction of 73% and normal perfusion.   Patient underwent elective DCCV on 08/26/10  . Breast cancer ([place]) 2007, 2013   right breast- 2007 radiation  . Cancer of right breast ([place])   . Chronic lower back pain   . Complication of anesthesia    "took me about 1 week to know what was going on after one of my knee ORs"  . COPD (chronic obstructive pulmonary disease) ([place])   . DDD (degenerative disc disease), lumbar   . 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 Dysrhythmia   . GERD (gastroesophageal reflux disease)   . Hyperlipidemia   . Hypertension   . Hypothyroid   . PAD (peripheral artery disease) ([place])   . PVD (peripheral vascular disease) ([place])     Past Surgical History:  Procedure Laterality Date  . ABDOMINAL EXPLORATION SURGERY     "had to go back in after hysterectomy & check on stitch dr had put near  my bladder; don't know if they took it out; had to wear catheter for 1 month"  . ABDOMINAL HYSTERECTOMY    . APPENDECTOMY    . BACK SURGERY    . BALLOON ANGIOPLASTY, ARTERY Right 09/08/2015   superficial femoral  . BREAST EXCISIONAL BIOPSY Right 2007   positive  . BREAST SURGERY    . CARDIOVERSION  11/10/2011   Procedure: CARDIOVERSION;  Surgeon: Lelon Perla, MD;  Location: [facility];  Service: Cardiovascular;  Laterality: N/A;  . CARDIOVERSION  08/26/2010  . COLONOSCOPY WITH PROPOFOL N/A 01/04/2016   Procedure: COLONOSCOPY WITH PROPOFOL;  Surgeon: Lollie Sails, MD;  Location: [facility] ENDOSCOPY;  Service: Endoscopy;  Laterality: N/A;  . HALLUX VALGUS CORRECTION    . JOINT REPLACEMENT    . LUMBAR DISC SURGERY    . MASTECTOMY Right 2013   positive  . PERIPHERAL VASCULAR CATHETERIZATION N/A 09/08/2015   Procedure: Abdominal Aortogram w/Lower Extremity;  Surgeon: Wellington Hampshire, MD;  Location: [facility] LAB;  Service: Cardiovascular;  Laterality: N/A;  . PERIPHERAL VASCULAR CATHETERIZATION  09/08/2015   Procedure: Peripheral Vascular Balloon Angioplasty;  Surgeon: Wellington Hampshire, MD;  Location: [facility];  Service: Cardiovascular;;  . REVISION TOTAL KNEE ARTHROPLASTY Right   . rt.ankle ulcer surgery  2013  . TEMPOROMANDIBULAR JOINT ARTHROPLASTY    . TMJ ARTHROPLASTY    . TONSILLECTOMY    . TOTAL KNEE ARTHROPLASTY Right     Family  History  Problem Relation Age of Onset  . Heart attack Father   . Coronary artery disease Sister 53    MI  . Breast cancer Maternal Aunt    Social History:  reports that she has never smoked. She has never used smokeless tobacco. She reports that she does not drink alcohol or use drugs.  Allergies:  Allergies  Allergen Reactions  . Amlodipine Nausea And Vomiting  . Ciprofloxacin Nausea Only  . Doxycycline Itching and Other (See Comments)    shaky  . Lipitor [Atorvastatin] Other (See Comments)    Joint pain   . Morphine And Related  Nausea And Vomiting  . Macrobid [Nitrofurantoin Macrocrystal] Rash  . Penicillins Rash and Other (See Comments)    Has patient had a PCN reaction causing immediate rash, facial/tongue/throat swelling, SOB or lightheadedness with hypotension: yes Has patient had a PCN reaction causing severe rash involving mucus membranes or skin necrosis: no Has patient had a PCN reaction that required hospitalization no Has patient had a PCN reaction occurring within the last 10 years: no If all of the above answers are "NO", then may proceed with Cephalosporin use.   . Sulfamethoxazole-Trimethoprim Rash     (Not in a hospital admission)  Results for orders placed or performed during the hospital encounter of 01/12/16 (from the past 48 hour(s))  Basic metabolic panel     Status: Abnormal   Collection Time: 01/12/16  1:21 AM  Result Value Ref Range   Sodium 137 135 - 145 mmol/L   Potassium 3.9 3.5 - 5.1 mmol/L   Chloride 101 101 - 111 mmol/L   CO2 25 22 - 32 mmol/L   Glucose, Bld 120 (H) 65 - 99 mg/dL   BUN 43 (H) 6 - 20 mg/dL   Creatinine, Ser 3.19 (H) 0.44 - 1.00 mg/dL   Calcium 9.2 8.9 - 10.3 mg/dL   GFR calc non Af Amer 13 (L) >60 mL/min   GFR calc Af Amer 15 (L) >60 mL/min    Comment: (NOTE) The eGFR has been calculated using the CKD EPI equation. This calculation has not been validated in all clinical situations. eGFR's persistently <60 mL/min signify possible Chronic Kidney Disease.    Anion gap 11 5 - 15  CBC     Status: None   Collection Time: 01/12/16  1:21 AM  Result Value Ref Range   WBC 11.0 3.6 - 11.0 K/uL   RBC 3.84 3.80 - 5.20 MIL/uL   Hemoglobin 12.9 12.0 - 16.0 g/dL   HCT 36.6 35.0 - 47.0 %   MCV 95.3 80.0 - 100.0 fL   MCH 33.7 26.0 - 34.0 pg   MCHC 35.3 32.0 - 36.0 g/dL   RDW 13.4 11.5 - 14.5 %   Platelets 258 150 - 440 K/uL  Troponin I     Status: None   Collection Time: 01/12/16  1:21 AM  Result Value Ref Range   Troponin I <0.03 <0.03 ng/mL   Ct Head Wo  Contrast  Result Date: 01/12/2016 CLINICAL DATA:  Acute onset of dizziness, syncope and fall. Laceration at the left side of the face. Concern for head or cervical spine injury. Initial encounter. EXAM: CT HEAD WITHOUT CONTRAST CT CERVICAL SPINE WITHOUT CONTRAST TECHNIQUE: Multidetector CT imaging of the head and cervical spine was performed following the standard protocol without intravenous contrast. Multiplanar CT image reconstructions of the cervical spine were also generated. COMPARISON:  CT of the head performed 06/07/2015 FINDINGS: CT HEAD FINDINGS Brain: No  evidence of acute infarction, hemorrhage, hydrocephalus, extra-axial collection or mass lesion/mass effect. Prominence of the ventricles and sulci reflects mild cortical volume loss. Mild cerebellar atrophy is noted. Scattered subcortical white matter change likely reflects small vessel ischemic microangiopathy. A small chronic lacunar infarct is noted at the posterior limb of the right internal capsule. The brainstem and fourth ventricle are within normal limits. The basal ganglia are unremarkable in appearance. The cerebral hemispheres demonstrate grossly normal gray-white differentiation. No mass effect or midline shift is seen. Vascular: No hyperdense vessel or unexpected calcification. Skull: There is no evidence of fracture; postoperative change is noted about the right temporal bone. Sinuses/Orbits: The visualized portions of the orbits are within normal limits. There is partial opacification of the left maxillary sinus. The remaining paranasal sinuses and mastoid air cells are well-aerated. Other: Mild apparent soft tissue swelling is suggested overlying the left parietal calvarium. CT CERVICAL SPINE FINDINGS Alignment: There is mild grade 1 anterolisthesis of C3 on C4 and of C4 on C5, reflecting underlying facet disease. Remaining alignment is normal. Skull base and vertebrae: No acute fracture. No primary bone lesion or focal pathologic  process. Soft tissues and spinal canal: No prevertebral fluid or swelling. No visible canal hematoma. Disc levels: Multilevel disc space narrowing is noted along the lower cervical spine, with scattered anterior and posterior disc osteophyte complexes, and endplate sclerotic change at C7-T1. Upper chest: Mild calcification is seen at the carotid bifurcations bilaterally. The visualized lung apices are grossly clear. The thyroid gland is diminutive but unremarkable in appearance. Other: No additional soft tissue abnormalities are seen. IMPRESSION: 1. No evidence of traumatic intracranial injury or fracture. 2. No evidence of fracture or subluxation along the cervical spine. 3. Mild apparent soft tissue swelling suggested overlying the left parietal calvarium. 4. Mild cortical volume loss and scattered small vessel ischemic microangiopathy. 5. Small chronic lacunar infarct at the posterior limb of the right internal capsule. 6. Partial opacification of the left maxillary sinus. 7. Mild degenerative change along the cervical spine, with mild grade 1 anterolisthesis of C3 on C4 and of C4 on C5, reflecting underlying facet disease. 8. Mild calcification at the carotid bifurcations bilaterally. Electronically Signed   By: Garald Balding M.D.   On: 01/12/2016 02:03   Ct Cervical Spine Wo Contrast  Result Date: 01/12/2016 CLINICAL DATA:  Acute onset of dizziness, syncope and fall. Laceration at the left side of the face. Concern for head or cervical spine injury. Initial encounter. EXAM: CT HEAD WITHOUT CONTRAST CT CERVICAL SPINE WITHOUT CONTRAST TECHNIQUE: Multidetector CT imaging of the head and cervical spine was performed following the standard protocol without intravenous contrast. Multiplanar CT image reconstructions of the cervical spine were also generated. COMPARISON:  CT of the head performed 06/07/2015 FINDINGS: CT HEAD FINDINGS Brain: No evidence of acute infarction, hemorrhage, hydrocephalus, extra-axial  collection or mass lesion/mass effect. Prominence of the ventricles and sulci reflects mild cortical volume loss. Mild cerebellar atrophy is noted. Scattered subcortical white matter change likely reflects small vessel ischemic microangiopathy. A small chronic lacunar infarct is noted at the posterior limb of the right internal capsule. The brainstem and fourth ventricle are within normal limits. The basal ganglia are unremarkable in appearance. The cerebral hemispheres demonstrate grossly normal gray-white differentiation. No mass effect or midline shift is seen. Vascular: No hyperdense vessel or unexpected calcification. Skull: There is no evidence of fracture; postoperative change is noted about the right temporal bone. Sinuses/Orbits: The visualized portions of the orbits are within normal  limits. There is partial opacification of the left maxillary sinus. The remaining paranasal sinuses and mastoid air cells are well-aerated. Other: Mild apparent soft tissue swelling is suggested overlying the left parietal calvarium. CT CERVICAL SPINE FINDINGS Alignment: There is mild grade 1 anterolisthesis of C3 on C4 and of C4 on C5, reflecting underlying facet disease. Remaining alignment is normal. Skull base and vertebrae: No acute fracture. No primary bone lesion or focal pathologic process. Soft tissues and spinal canal: No prevertebral fluid or swelling. No visible canal hematoma. Disc levels: Multilevel disc space narrowing is noted along the lower cervical spine, with scattered anterior and posterior disc osteophyte complexes, and endplate sclerotic change at C7-T1. Upper chest: Mild calcification is seen at the carotid bifurcations bilaterally. The visualized lung apices are grossly clear. The thyroid gland is diminutive but unremarkable in appearance. Other: No additional soft tissue abnormalities are seen. IMPRESSION: 1. No evidence of traumatic intracranial injury or fracture. 2. No evidence of fracture or  subluxation along the cervical spine. 3. Mild apparent soft tissue swelling suggested overlying the left parietal calvarium. 4. Mild cortical volume loss and scattered small vessel ischemic microangiopathy. 5. Small chronic lacunar infarct at the posterior limb of the right internal capsule. 6. Partial opacification of the left maxillary sinus. 7. Mild degenerative change along the cervical spine, with mild grade 1 anterolisthesis of C3 on C4 and of C4 on C5, reflecting underlying facet disease. 8. Mild calcification at the carotid bifurcations bilaterally. Electronically Signed   By: Garald Balding M.D.   On: 01/12/2016 02:03   Dg Chest Port 1 View  Result Date: 01/12/2016 CLINICAL DATA:  82 year old female with fall. EXAM: PORTABLE CHEST 1 VIEW COMPARISON:  Chest radiograph dated 05/18/2014 FINDINGS: The heart size and mediastinal contours are within normal limits. Both lungs are clear. The visualized skeletal structures are unremarkable. IMPRESSION: No active disease. Electronically Signed   By: Anner Crete M.D.   On: 01/12/2016 03:16    Review of Systems  Constitutional: Negative for chills and fever.  HENT: Negative for sore throat and tinnitus.   Eyes: Negative for blurred vision and redness.  Respiratory: Negative for cough and shortness of breath.   Cardiovascular: Negative for chest pain, palpitations, orthopnea and PND.  Gastrointestinal: Negative for abdominal pain, diarrhea, nausea and vomiting.  Genitourinary: Negative for dysuria, frequency and urgency.  Musculoskeletal: Negative for joint pain and myalgias.  Skin: Negative for rash.       No lesions  Neurological: Positive for loss of consciousness. Negative for speech change, focal weakness and weakness.  Endo/Heme/Allergies: Does not bruise/bleed easily.       No temperature intolerance  Psychiatric/Behavioral: Negative for depression and suicidal ideas.    Blood pressure (!) 167/55, pulse (!) 55, temperature 97.8 F  (36.6 C), temperature source Oral, resp. rate 17, height '5\' 8"'$  (1.727 m), weight 59.4 kg (131 lb), SpO2 100 %. Physical Exam  Vitals reviewed. Constitutional: She is oriented to person, place, and time. She appears well-developed and well-nourished. No distress.  HENT:  Head: Normocephalic.  Mouth/Throat: Oropharynx is clear and moist.  Facial trauma s/p suture repair  Eyes: Conjunctivae and EOM are normal. Pupils are equal, round, and reactive to light. No scleral icterus.  Neck: Normal range of motion. Neck supple. No JVD present. No tracheal deviation present. No thyromegaly present.  Cardiovascular: Normal rate, regular rhythm and normal heart sounds.  Exam reveals no gallop and no friction rub.   No murmur heard. Respiratory: Effort normal and  breath sounds normal.  GI: Soft. Bowel sounds are normal. She exhibits no distension. There is no tenderness.  Genitourinary:  Genitourinary Comments: Deferred  Musculoskeletal: Normal range of motion. She exhibits no edema.  Lymphadenopathy:    She has no cervical adenopathy.  Neurological: She is alert and oriented to person, place, and time. No cranial nerve deficit. She exhibits normal muscle tone.  Skin: Skin is warm and dry. No rash noted. No erythema.  Psychiatric: She has a normal mood and affect. Her behavior is normal. Judgment and thought content normal.     Assessment/Plan This is an 83 year old female admitted for acute kidney injury and syncope. 1. Acute kidney injury: Secondary to dehydration. Avoid nephrotoxic agents. Hydrate aggressively with intravenous fluid. Hold lisinopril and spironolactone. 2. Syncope: Likely orthostatic as the patient is very dehydrated. Echocardiogram ordered due to history of atrial fibrillation and syncope. 3. Hypertension: Uncontrolled; continue home meds as the patient will be bedbound at least for a little while during hospitalization. Rehydration with normal saline should improve orthostasis and  uncontrolled blood pressure will help with renal perfusion. Hydralazine prn. 4. Hypothyroidism: Continue Synthroid 5. Atrial fibrillation: Paroxysmal; continue amiodarone 6. Hyperlipidemia: Continue statin therapy 7. DVT prophylaxis: Heparin 8. GI prophylaxis: Pantoprazole per home regimen The patient is a full code. Time spent on admission orders and patient care possibly 45 minutes  Harrie Foreman, MD 01/12/2016, 5:17 AM

## 2016-01-12 NOTE — ED Triage Notes (Addendum)
Pt presents to ED from home via [facility], pt reports got up to get TUMS, states she remembers walking to bathroom but does not remember after that. Pt reports feeling dizzy last night. Laceration noted left side above lip and below lip. Pt alert and oriented x 4, no increased work in breathing. MD at bedside. EMS gave pt 4 mg of Zofran.

## 2016-01-12 NOTE — Progress Notes (Signed)
Initial Nutrition Assessment  DOCUMENTATION CODES:   Severe malnutrition in context of acute illness/injury  INTERVENTION:  -Recommend dysphagia 3 diet (chopped) secondary to mouth injury from fall. Discussed with MD Vianne Bulls. Discussed soft foods on menu with pt and husband. Husband to order soft foods from kitchen.  -Recommend Ensure Enlive po TID, each supplement provides 350 kcal and 20 grams of protein    NUTRITION DIAGNOSIS:   Malnutrition related to acute illness as evidenced by energy intake < or equal to 50% for > or equal to 5 days, percent weight loss.    GOAL:   Patient will meet greater than or equal to 90% of their needs    MONITOR:   PO intake, Supplement acceptance, Weight trends  REASON FOR ASSESSMENT:   Malnutrition Screening Tool    ASSESSMENT:      82 y.o. Female admitted with acute kidney injury, syncope resulting in fall.  Pt has stiches above lip, mouth sore from fall.    Pt with history of HTN, afib, diarrhea for the past 2 months (seen GI), breast cancer, COPD, GERD, HLD  Husband at bedside and reports pt with decreased intake over the past 2 weeks, eating half of normal intake (ie eating half sandwich vs whole), has been drinking some boost as well prior to admission.   Husband reports wt loss of 20-25 pounds in the last 6 months. Noted per chart wt loss of 10% wt loss in the last 4 months  Medications reviewed: colace, fe sulfate, MVI, Vit C, NS at 152ml/hr Labs reviewed: BUN 43, creatinine 3.19, glucose 120  Nutrition-Focused physical exam completed. Findings are mild in thoracic lumbar areaq fat depletion, mild.moderate muscle depletion, and no edema.    Diet Order:  Diet Heart Room service appropriate? Yes; Fluid consistency: Thin  Skin:   (stage I pressure ulcer noted on ankle)  Last BM:  9/27  Height:   Ht Readings from Last 1 Encounters:  01/12/16 5\' 8"  (1.727 m)    Weight:   Wt Readings from Last 1 Encounters:   01/12/16 130 lb 8 oz (59.2 kg)    Ideal Body Weight:     BMI:  Body mass index is 19.84 kg/m.  Estimated Nutritional Needs:   Kcal:  2831-5176 kcals/d  Protein:  71-89 g/d  Fluid:  >/= 1.7L/d  EDUCATION NEEDS:   Education needs addressed  Wissam Resor B. Zenia Resides, [place], [place] (pager) Weekend/On-Call pager [phone])

## 2016-01-12 NOTE — Progress Notes (Signed)
  Echocardiogram 2D Echocardiogram has been performed.  [name] 01/12/2016, 8:45 AM

## 2016-01-12 NOTE — Evaluation (Signed)
Physical Therapy Evaluation Patient Details Name: [name] MRN: [id number] DOB: 08-Oct-1932 Today's Date: 01/12/2016   History of Present Illness  Pt admitted for acute kidney injury. Pt reports getting out of bed and falling. Lacerations noted to skin on face and R arm. Pt with history of HTN and afib.  Clinical Impression  Pt is a pleasant 82 year old female who was admitted for acute kidney injury. Pt performs bed mobility/transfers with supervision and ambulation with cga and RW. Pt demonstrates deficits with balance/mobility. Performed orthostatic BP supine: 115/38; sitting: 103/59; standing: 102/39. Pt with positive dizziness with change in position. Mobility limited this date secondary to new nose bleed that occurred during ambulation. RN notified and in to assess. Pt reports she feels she would like therapy in home environment to improve functional independence. Would benefit from skilled PT to address above deficits and promote optimal return to PLOF. Recommend transition to [facility] upon discharge from acute hospitalization.       Follow Up Recommendations Home health PT    Equipment Recommendations       Recommendations for Other Services       Precautions / Restrictions Precautions Precautions: Fall Restrictions Weight Bearing Restrictions: No      Mobility  Bed Mobility Overal bed mobility: Needs Assistance Bed Mobility: Supine to Sit     Supine to sit: Supervision     General bed mobility comments: Safe technique performed. Use of bedrail noted. Once seated at EOB, slight dizziness noted  Transfers Overall transfer level: Needs assistance Equipment used: Rolling walker (2 wheeled) Transfers: Sit to/from Stand Sit to Stand: Supervision         General transfer comment: safe technique for transfer with pt able to push from seated surface. Once standing, able to stand with upright posture, dizziness noted  Ambulation/Gait Ambulation/Gait assistance: Min  guard Ambulation Distance (Feet): 3 Feet Assistive device: Rolling walker (2 wheeled) Gait Pattern/deviations: Step-to pattern     General Gait Details: ambulated to recliner. Pt then had nosebleed limiting further ambulation. Safe technique performe with step to gait pattern  Stairs            Wheelchair Mobility    Modified Rankin (Stroke Patients Only)       Balance Overall balance assessment: History of Falls;Needs assistance Sitting-balance support: Feet supported Sitting balance-[name]: Normal     Standing balance support: Bilateral upper extremity supported Standing balance-[name]: Fair                               Pertinent Vitals/Pain Pain Assessment: No/denies pain    Home Living Family/patient expects to be discharged to:: Private residence Living Arrangements: Spouse/significant other Available Help at Discharge: Family Type of Home: House Home Access: Stairs to enter Entrance Stairs-Rails: Can reach both Entrance Stairs-Number of Steps: 5 Home Layout: One level Home Equipment: Baxter - 2 wheels;Cane - single point      Prior Function Level of Independence: Independent with assistive device(s)         Comments: Pt able to navigate community distances, however fatigues quickly. Typically only used AD for longer distances.     Hand Dominance        Extremity/Trunk Assessment   Upper Extremity Assessment: Generalized weakness (B UE grossly 4/5)           Lower Extremity Assessment: Generalized weakness (B LE grossly 4/5)  Communication   Communication: No difficulties  Cognition Arousal/Alertness: Awake/alert Behavior During Therapy: WFL for tasks assessed/performed Overall Cognitive Status: Within Functional Limits for tasks assessed                      General Comments      Exercises     Assessment/Plan    PT Assessment Patient needs continued PT services  PT Problem List  Decreased strength;Decreased balance;Decreased mobility          PT Treatment Interventions Gait training;Therapeutic exercise    PT Goals (Current goals can be found in the Care Plan section)  Acute Rehab PT Goals Patient Stated Goal: to go home PT Goal Formulation: With patient Time For Goal Achievement: 01/26/16 Potential to Achieve Goals: Good    Frequency Min 2X/week   Barriers to discharge        Co-evaluation               End of Session Equipment Utilized During Treatment: Gait belt Activity Tolerance: Patient tolerated treatment well Patient left: in chair;with chair alarm set;with nursing/sitter in room Nurse Communication: Mobility status         Time: 4462-8638 PT Time Calculation (min) (ACUTE ONLY): 17 min   Charges:   PT Evaluation $PT Eval Moderate Complexity: 1 Procedure     PT G Codes:        Italo Banton 09-Feb-2016, 4:45 PM  Greggory Stallion, PT, DPT [phone]

## 2016-01-12 NOTE — Progress Notes (Signed)
Patient's pathology returned positive for collagenous colitis. Patient's condition discussed with husband via phone when called home number with pathology results. Will go ahead and start Budesonide 9mg  PO qday while she is in patient. Will have her follow up in office next week.   Available if needed-page [phone] Laurine Blazer, PA-C

## 2016-01-12 NOTE — Care Management (Signed)
Admitted to [facility] with the diagnosis of acute kidney injury. Lives with husband, [name], [phone]). Last seen Dr. Sabra Heck about a month ago. Home Health about 2.5 years ago. Can't remember name of agency. Skilled nursing at [facility] about 2.5 years ago following knee surgery. No home oxygen. Rolling walker, cane, and bedside commode over toilet in the home. Life Alert in the home. Takes care of all basic activities of daily living herself, drives. Fell last night and 2 months ago. Decreased appetite x 2 weeks. Lost 5 lbs and had diarrhea. Prescriptions are filled at [facility] in [place]. Husband will transport. Shelbie Ammons RN MSN CCM Care Management [phone]

## 2016-01-13 DIAGNOSIS — I480 Paroxysmal atrial fibrillation: Secondary | ICD-10-CM | POA: Diagnosis not present

## 2016-01-13 DIAGNOSIS — I10 Essential (primary) hypertension: Secondary | ICD-10-CM | POA: Diagnosis not present

## 2016-01-13 DIAGNOSIS — R55 Syncope and collapse: Secondary | ICD-10-CM | POA: Diagnosis not present

## 2016-01-13 DIAGNOSIS — N179 Acute kidney failure, unspecified: Secondary | ICD-10-CM | POA: Diagnosis not present

## 2016-01-13 LAB — HEMOGLOBIN A1C
Hgb A1c MFr Bld: 5.4 % (ref 4.8–5.6)
Mean Plasma Glucose: 108 mg/dL

## 2016-01-13 LAB — BASIC METABOLIC PANEL
Anion gap: 4 — ABNORMAL LOW (ref 5–15)
BUN: 29 mg/dL — ABNORMAL HIGH (ref 6–20)
CHLORIDE: 114 mmol/L — AB (ref 101–111)
CO2: 26 mmol/L (ref 22–32)
Calcium: 8.6 mg/dL — ABNORMAL LOW (ref 8.9–10.3)
Creatinine, Ser: 1.62 mg/dL — ABNORMAL HIGH (ref 0.44–1.00)
GFR calc non Af Amer: 29 mL/min — ABNORMAL LOW (ref >60)
GFR, EST AFRICAN AMERICAN: 33 mL/min — AB (ref >60)
Glucose, Bld: 108 mg/dL — ABNORMAL HIGH (ref 65–99)
POTASSIUM: 4 mmol/L (ref 3.5–5.1)
SODIUM: 144 mmol/L (ref 135–145)

## 2016-01-13 NOTE — Progress Notes (Signed)
Physical Therapy Treatment Patient Details Name: [name] MRN: [id number] DOB: 1932-05-16 Today's Date: 01/13/2016    History of Present Illness Pt admitted for acute kidney injury. Pt reports getting out of bed and falling. Lacerations noted to skin on face and R arm. Pt with history of HTN and afib.    PT Comments    Pt is making good progress towards goals with increased ambulation distance noted this session. Pt with slow and steady technique with use of RW. Recommend use of RW at all time. Pt reports her ambulation is very close to baseline level with mobility. Reports her husband will be able to provide support at home. Good endurance with there-ex.  Follow Up Recommendations  Home health PT     Equipment Recommendations       Recommendations for Other Services       Precautions / Restrictions Precautions Precautions: Fall Precaution Booklet Issued: No Restrictions Weight Bearing Restrictions: No    Mobility  Bed Mobility Overal bed mobility: Needs Assistance Bed Mobility: Supine to Sit     Supine to sit: Supervision     General bed mobility comments: Slow safe technique performed. Once seated at EOB, pt has no complaints of dizziness.  Transfers Overall transfer level: Needs assistance Equipment used: Rolling walker (2 wheeled) Transfers: Sit to/from Stand Sit to Stand: Supervision         General transfer comment: Cues to push from seated surface. Once standing, pt able to safely use RW  Ambulation/Gait Ambulation/Gait assistance: Min guard Ambulation Distance (Feet): 50 Feet Assistive device: Rolling walker (2 wheeled) Gait Pattern/deviations: Step-to pattern     General Gait Details: ambulation performed in room as pt refused to ambulate in hallway. Pt able to perform step to gait pattern with slow technique. Slight veering noted to L side, however pt is able to self correct with cues. Pt complains of soreness during ambulation.   Stairs            Wheelchair Mobility    Modified Rankin (Stroke Patients Only)       Balance                                    Cognition Arousal/Alertness: Awake/alert Behavior During Therapy: WFL for tasks assessed/performed Overall Cognitive Status: Within Functional Limits for tasks assessed                      Exercises Other Exercises Other Exercises: Seated ther-ex performed on B LE including hip abd/add, SAQ, SLRs, heel slides, and hip add sequeezes. All ther-ex performed x 10 reps with cga.     General Comments        Pertinent Vitals/Pain Pain Assessment: Faces Faces Pain Scale: Hurts little more Pain Location: face while eating breakfasat Pain Descriptors / Indicators: Discomfort Pain Intervention(s): Limited activity within patient's tolerance    Home Living                      Prior Function            PT Goals (current goals can now be found in the care plan section) Acute Rehab PT Goals Patient Stated Goal: to go home PT Goal Formulation: With patient Time For Goal Achievement: 01/26/16 Potential to Achieve Goals: Good Progress towards PT goals: Progressing toward goals    Frequency    Min 2X/week  PT Plan Current plan remains appropriate    Co-evaluation             End of Session Equipment Utilized During Treatment: Gait belt Activity Tolerance: Patient tolerated treatment well Patient left: in chair;with chair alarm set;with nursing/sitter in room     Time: 0947-1010 PT Time Calculation (min) (ACUTE ONLY): 23 min  Charges:  $Gait Training: 8-22 mins $Therapeutic Exercise: 8-22 mins                    G Codes:      Damondre Pfeifle 02/03/2016, 10:31 AM  Greggory Stallion, PT, DPT [phone]

## 2016-01-13 NOTE — Progress Notes (Signed)
[place] [facility] [place] NAME: [name]    MR#:  [id number]  DATE OF BIRTH:  04-06-1933  SUBJECTIVE:  Pt is doing better.   CHIEF COMPLAINT:   Chief Complaint  Patient presents with  . Fall  . Facial Laceration    REVIEW OF SYSTEMS:   ROS CONSTITUTIONAL: No fever, fatigue or weakness.  EYES: No blurred or double vision. Facial laceration EARS, NOSE, AND THROAT: No tinnitus or ear pain.  RESPIRATORY: No cough, shortness of breath, wheezing or hemoptysis.  CARDIOVASCULAR: No chest pain, orthopnea, edema.  GASTROINTESTINAL: No nausea, vomiting, diarrhea or abdominal pain.  GENITOURINARY: No dysuria, hematuria.  ENDOCRINE: No polyuria, nocturia,  HEMATOLOGY: No anemia, easy bruising or bleeding SKIN: No rash or lesion. MUSCULOSKELETAL: No joint pain or arthritis.   NEUROLOGIC: No tingling, numbness, weakness.  PSYCHIATRY: No anxiety or depression.   DRUG ALLERGIES:   Allergies  Allergen Reactions  . Amlodipine Nausea And Vomiting  . Ciprofloxacin Nausea Only  . Doxycycline Itching and Other (See Comments)    shaky  . Lipitor [Atorvastatin] Other (See Comments)    Joint pain   . Morphine And Related Nausea And Vomiting  . Macrobid [Nitrofurantoin Macrocrystal] Rash  . Penicillins Rash and Other (See Comments)    Has patient had a PCN reaction causing immediate rash, facial/tongue/throat swelling, SOB or lightheadedness with hypotension: yes Has patient had a PCN reaction causing severe rash involving mucus membranes or skin necrosis: no Has patient had a PCN reaction that required hospitalization no Has patient had a PCN reaction occurring within the last 10 years: no If all of the above answers are "NO", then may proceed with Cephalosporin use.   . Sulfamethoxazole-Trimethoprim Rash    VITALS:  Blood pressure (!) 149/52, pulse 68, temperature 98.1 F (36.7 C), temperature source Oral, resp. rate (!) 22, height 5\' 8"   (1.727 m), weight 59.9 kg (132 lb 1.6 oz), SpO2 96 %.  PHYSICAL EXAMINATION:  GENERAL:  82 y.o.-year-old patient lying in the bed with no acute distress.  EYES: Pupils equal, round, reactive to light and accommodation. No scleral icterus. Extraocular muscles intact. Facial laceration. HEENT: Head atraumatic, normocephalic. Oropharynx and nasopharynx clear.  NECK:  Supple, no jugular venous distention. No thyroid enlargement, no tenderness.  LUNGS: Normal breath sounds bilaterally, no wheezing, rales,rhonchi or crepitation. No use of accessory muscles of respiration.  CARDIOVASCULAR: S1, S2 normal. No murmurs, rubs, or gallops.  ABDOMEN: Soft, nontender, nondistended. Bowel sounds present. No organomegaly or mass.  EXTREMITIES: No pedal edema, cyanosis, or clubbing.  NEUROLOGIC: Cranial nerves II through XII are intact. Muscle strength 5/5 in all extremities. Sensation intact. Gait not checked.  PSYCHIATRIC: The patient is alert and oriented x 3.  SKIN: No obvious rash, lesion, or ulcer.    LABORATORY PANEL:   CBC  Recent Labs Lab 01/12/16 0121  WBC 11.0  HGB 12.9  HCT 36.6  PLT 258   ------------------------------------------------------------------------------------------------------------------  Chemistries   Recent Labs Lab 01/13/16 0509  NA 144  K 4.0  CL 114*  CO2 26  GLUCOSE 108*  BUN 29*  CREATININE 1.62*  CALCIUM 8.6*   ------------------------------------------------------------------------------------------------------------------  Cardiac Enzymes  Recent Labs Lab 01/12/16 0121  TROPONINI <0.03   ------------------------------------------------------------------------------------------------------------------  RADIOLOGY:  Ct Head Wo Contrast  Result Date: 01/12/2016 CLINICAL DATA:  Acute onset of dizziness, syncope and fall. Laceration at the left side of the face. Concern for head or cervical spine injury. Initial encounter. EXAM:  CT HEAD WITHOUT  CONTRAST CT CERVICAL SPINE WITHOUT CONTRAST TECHNIQUE: Multidetector CT imaging of the head and cervical spine was performed following the standard protocol without intravenous contrast. Multiplanar CT image reconstructions of the cervical spine were also generated. COMPARISON:  CT of the head performed 06/07/2015 FINDINGS: CT HEAD FINDINGS Brain: No evidence of acute infarction, hemorrhage, hydrocephalus, extra-axial collection or mass lesion/mass effect. Prominence of the ventricles and sulci reflects mild cortical volume loss. Mild cerebellar atrophy is noted. Scattered subcortical white matter change likely reflects small vessel ischemic microangiopathy. A small chronic lacunar infarct is noted at the posterior limb of the right internal capsule. The brainstem and fourth ventricle are within normal limits. The basal ganglia are unremarkable in appearance. The cerebral hemispheres demonstrate grossly normal gray-white differentiation. No mass effect or midline shift is seen. Vascular: No hyperdense vessel or unexpected calcification. Skull: There is no evidence of fracture; postoperative change is noted about the right temporal bone. Sinuses/Orbits: The visualized portions of the orbits are within normal limits. There is partial opacification of the left maxillary sinus. The remaining paranasal sinuses and mastoid air cells are well-aerated. Other: Mild apparent soft tissue swelling is suggested overlying the left parietal calvarium. CT CERVICAL SPINE FINDINGS Alignment: There is mild grade 1 anterolisthesis of C3 on C4 and of C4 on C5, reflecting underlying facet disease. Remaining alignment is normal. Skull base and vertebrae: No acute fracture. No primary bone lesion or focal pathologic process. Soft tissues and spinal canal: No prevertebral fluid or swelling. No visible canal hematoma. Disc levels: Multilevel disc space narrowing is noted along the lower cervical spine, with scattered anterior and posterior  disc osteophyte complexes, and endplate sclerotic change at C7-T1. Upper chest: Mild calcification is seen at the carotid bifurcations bilaterally. The visualized lung apices are grossly clear. The thyroid gland is diminutive but unremarkable in appearance. Other: No additional soft tissue abnormalities are seen. IMPRESSION: 1. No evidence of traumatic intracranial injury or fracture. 2. No evidence of fracture or subluxation along the cervical spine. 3. Mild apparent soft tissue swelling suggested overlying the left parietal calvarium. 4. Mild cortical volume loss and scattered small vessel ischemic microangiopathy. 5. Small chronic lacunar infarct at the posterior limb of the right internal capsule. 6. Partial opacification of the left maxillary sinus. 7. Mild degenerative change along the cervical spine, with mild grade 1 anterolisthesis of C3 on C4 and of C4 on C5, reflecting underlying facet disease. 8. Mild calcification at the carotid bifurcations bilaterally. Electronically Signed   By: Garald Balding M.D.   On: 01/12/2016 02:03   Ct Cervical Spine Wo Contrast  Result Date: 01/12/2016 CLINICAL DATA:  Acute onset of dizziness, syncope and fall. Laceration at the left side of the face. Concern for head or cervical spine injury. Initial encounter. EXAM: CT HEAD WITHOUT CONTRAST CT CERVICAL SPINE WITHOUT CONTRAST TECHNIQUE: Multidetector CT imaging of the head and cervical spine was performed following the standard protocol without intravenous contrast. Multiplanar CT image reconstructions of the cervical spine were also generated. COMPARISON:  CT of the head performed 06/07/2015 FINDINGS: CT HEAD FINDINGS Brain: No evidence of acute infarction, hemorrhage, hydrocephalus, extra-axial collection or mass lesion/mass effect. Prominence of the ventricles and sulci reflects mild cortical volume loss. Mild cerebellar atrophy is noted. Scattered subcortical white matter change likely reflects small vessel ischemic  microangiopathy. A small chronic lacunar infarct is noted at the posterior limb of the right internal capsule. The brainstem and fourth ventricle are within normal limits. The basal  ganglia are unremarkable in appearance. The cerebral hemispheres demonstrate grossly normal gray-white differentiation. No mass effect or midline shift is seen. Vascular: No hyperdense vessel or unexpected calcification. Skull: There is no evidence of fracture; postoperative change is noted about the right temporal bone. Sinuses/Orbits: The visualized portions of the orbits are within normal limits. There is partial opacification of the left maxillary sinus. The remaining paranasal sinuses and mastoid air cells are well-aerated. Other: Mild apparent soft tissue swelling is suggested overlying the left parietal calvarium. CT CERVICAL SPINE FINDINGS Alignment: There is mild grade 1 anterolisthesis of C3 on C4 and of C4 on C5, reflecting underlying facet disease. Remaining alignment is normal. Skull base and vertebrae: No acute fracture. No primary bone lesion or focal pathologic process. Soft tissues and spinal canal: No prevertebral fluid or swelling. No visible canal hematoma. Disc levels: Multilevel disc space narrowing is noted along the lower cervical spine, with scattered anterior and posterior disc osteophyte complexes, and endplate sclerotic change at C7-T1. Upper chest: Mild calcification is seen at the carotid bifurcations bilaterally. The visualized lung apices are grossly clear. The thyroid gland is diminutive but unremarkable in appearance. Other: No additional soft tissue abnormalities are seen. IMPRESSION: 1. No evidence of traumatic intracranial injury or fracture. 2. No evidence of fracture or subluxation along the cervical spine. 3. Mild apparent soft tissue swelling suggested overlying the left parietal calvarium. 4. Mild cortical volume loss and scattered small vessel ischemic microangiopathy. 5. Small chronic lacunar  infarct at the posterior limb of the right internal capsule. 6. Partial opacification of the left maxillary sinus. 7. Mild degenerative change along the cervical spine, with mild grade 1 anterolisthesis of C3 on C4 and of C4 on C5, reflecting underlying facet disease. 8. Mild calcification at the carotid bifurcations bilaterally. Electronically Signed   By: Garald Balding M.D.   On: 01/12/2016 02:03   Dg Chest Port 1 View  Result Date: 01/12/2016 CLINICAL DATA:  82 year old female with fall. EXAM: PORTABLE CHEST 1 VIEW COMPARISON:  Chest radiograph dated 05/18/2014 FINDINGS: The heart size and mediastinal contours are within normal limits. Both lungs are clear. The visualized skeletal structures are unremarkable. IMPRESSION: No active disease. Electronically Signed   By: Anner Crete M.D.   On: 01/12/2016 03:16    EKG:   Orders placed or performed during the hospital encounter of 01/12/16  . ED EKG  . ED EKG    ASSESSMENT AND PLAN:  Syncope secondary to acute renal failure: Improved.  Acute renal failure secondary to ATN'improving with IV hydration #3 .bradycardia in patient with atrial fibrillation: Heart rate much better but we're holding the amiodarone, metoprolol. Consult cardiology. #4 chronic diarrhea: Patient found to have collagenous colitis, started on enticort by GI. #5 deconditioning; physical therapy recommended home health physical therapy #6 uncontrolled hypertension: Controlled at this time. likley d/c tomorrow. D/w nurse and patient  All the records are reviewed and case discussed with Care Management/Social Workerr. Management plans discussed with the patient, family and they are in agreement.  CODE STATUS: full  TOTAL TIME TAKING CARE OF THIS PATIENT: 27minutes.   POSSIBLE D/C IN 1-2DAYS, DEPENDING ON CLINICAL CONDITION.   Epifanio Lesches M.D on 01/13/2016 at 12:35 PM  Between 7am to 6pm - Pager - [phone]  After 6pm go to [email or web address] - password  [facility]  Office  [phone]  CC: Primary care physician; Rusty Aus, MD   Note: This dictation was prepared with Dragon dictation along with smaller  Company secretary. Any transcriptional errors that result from this process are unintentional.

## 2016-01-14 DIAGNOSIS — I480 Paroxysmal atrial fibrillation: Secondary | ICD-10-CM | POA: Diagnosis not present

## 2016-01-14 DIAGNOSIS — Z23 Encounter for immunization: Secondary | ICD-10-CM | POA: Diagnosis not present

## 2016-01-14 DIAGNOSIS — I951 Orthostatic hypotension: Secondary | ICD-10-CM | POA: Diagnosis present

## 2016-01-14 DIAGNOSIS — Y92009 Unspecified place in unspecified non-institutional (private) residence as the place of occurrence of the external cause: Secondary | ICD-10-CM | POA: Diagnosis not present

## 2016-01-14 DIAGNOSIS — R197 Diarrhea, unspecified: Secondary | ICD-10-CM | POA: Diagnosis present

## 2016-01-14 DIAGNOSIS — N179 Acute kidney failure, unspecified: Secondary | ICD-10-CM | POA: Diagnosis not present

## 2016-01-14 DIAGNOSIS — W1830XA Fall on same level, unspecified, initial encounter: Secondary | ICD-10-CM | POA: Diagnosis not present

## 2016-01-14 DIAGNOSIS — R55 Syncope and collapse: Secondary | ICD-10-CM | POA: Diagnosis not present

## 2016-01-14 DIAGNOSIS — E860 Dehydration: Secondary | ICD-10-CM | POA: Diagnosis present

## 2016-01-14 DIAGNOSIS — I10 Essential (primary) hypertension: Secondary | ICD-10-CM | POA: Diagnosis not present

## 2016-01-14 LAB — BASIC METABOLIC PANEL
Anion gap: 4 — ABNORMAL LOW (ref 5–15)
BUN: 17 mg/dL (ref 6–20)
CHLORIDE: 116 mmol/L — AB (ref 101–111)
CO2: 24 mmol/L (ref 22–32)
CREATININE: 1.01 mg/dL — AB (ref 0.44–1.00)
Calcium: 8.5 mg/dL — ABNORMAL LOW (ref 8.9–10.3)
GFR calc Af Amer: 59 mL/min — ABNORMAL LOW (ref >60)
GFR calc non Af Amer: 51 mL/min — ABNORMAL LOW (ref >60)
GLUCOSE: 95 mg/dL (ref 65–99)
Potassium: 3.9 mmol/L (ref 3.5–5.1)
Sodium: 144 mmol/L (ref 135–145)

## 2016-01-14 MED ORDER — BUDESONIDE 3 MG PO CPEP: 9.0000 mg | ORAL_CAPSULE | Freq: Every day | ORAL | 0 refills | Status: DC

## 2016-01-14 MED ORDER — HYDRALAZINE HCL 25 MG PO TABS: 25.0000 mg | ORAL_TABLET | Freq: Three times a day (TID) | ORAL | 0 refills | Status: DC

## 2016-01-14 MED ORDER — METAXALONE 800 MG PO TABS: 800.0000 mg | ORAL_TABLET | Freq: Three times a day (TID) | ORAL | 0 refills | Status: DC

## 2016-01-14 MED ORDER — ENSURE ENLIVE PO LIQD: 237.0000 mL | Freq: Three times a day (TID) | ORAL | 12 refills | Status: DC

## 2016-01-14 MED ORDER — OXYCODONE-ACETAMINOPHEN 5-325 MG PO TABS: 1.0000 | ORAL_TABLET | Freq: Four times a day (QID) | ORAL | 0 refills | Status: DC | PRN

## 2016-01-14 NOTE — Consult Note (Signed)
Cardiology Consultation Note  Patient ID: [name], MRN: [id number], DOB/AGE: Jun 14, 1932 82 y.o. Admit date: 01/12/2016   Date of Consult: 01/14/2016 Primary Physician: Rusty Aus, MD Primary Cardiologist: Dr. Fletcher Anon, MD Requesting Physician: Dr. Vianne Bulls, MD  Chief Complaint: Fall Reason for Consult: PAF  HPI: 82 y.o. female with h/o PAF (s/p DCCV in 2012, on Amiodarone, no anticoagulation due to frequent falls), chronic diastolic CHF, HTN and PVD who presented to [facility] on 9/27 after a fall in the setting of orthostatic hypotension.   Previous cardiology results include an ischemic evaluation with a NST in 2012 which showed no evidence of ischemia. Echocardiogram in 07/2013 showed a preserved EF of 60-65% with no WMA. Grade 2 DD noted along with mild AR and mild MR. She is followed by Dr. Fletcher Anon for PVD and underwent angiography in 08/2015 which showed significant proximal right popliteal stenosis with short occlusion of the right anterior tibial trunk. She underwent successful angioplasty with a drug coated balloon angioplasty and was on DAPT for one month. ABI's in 09/2015 were improved to 0.92 on right and 0.95 on left. Doppler studies showed patent popliteal and anterior tibial arteries with possible occlusion of the peroneal artery.   Patient has been dealing with multiple weeks of watery diarrhea. She underwent colonoscopy on 9/19 that showed colitis. Work up per GI. She got up in the middle of the night on the night prior to admission, took two steps, got dizzy, and fell leading to multiple contusions and lacerations. Upon her arrival to [facility] she was found to be orthostatic with diastolic BP dropping 20 mmHg form sitting to standing. She was also noted to be in ARF with  SCr >3 with a baseline around 1.7. Renal function has improved to 1.01 with hydrationHer heart rate was initially in the mid 50's bpm. Her metoprolol and amiodarone were held with improvement in heart rate to the 60's. Has  not ambulated yet. Currently without palpitations and SOB.   Past Medical History:  Diagnosis Date  . Arthritis    "back, hands" (09/08/2015)  . Atrial fibrillation ([place])    Echocardiogram 5/12: normal LV function, moderate LAE, mild RAE, mild AI and MR and mild-to-moderate TR.  Myoview 5/ 12 showed an ejection fraction of 73% and normal perfusion.   Patient underwent elective DCCV on 08/26/10  . Breast cancer ([place]) 2007, 2013   right breast- 2007 radiation  . Cancer of right breast ([place])   . Chronic lower back pain   . Complication of anesthesia    "took me about 1 week to know what was going on after one of my knee ORs"  . COPD (chronic obstructive pulmonary disease) ([place])   . DDD (degenerative disc disease), lumbar   . 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 Dysrhythmia   . GERD (gastroesophageal reflux disease)   . Hyperlipidemia   . Hypertension   . Hypothyroid   . PAD (peripheral artery disease) ([place])   . PVD (peripheral vascular disease) ([place])       Most Recent Cardiac Studies: As above   Surgical History:  Past Surgical History:  Procedure Laterality Date  . ABDOMINAL EXPLORATION SURGERY     "had to go back in after hysterectomy & check on stitch dr had put near my bladder; don't know if they took it out; had to wear catheter for 1 month"  . ABDOMINAL HYSTERECTOMY    . APPENDECTOMY    . BACK SURGERY    .  BALLOON ANGIOPLASTY, ARTERY Right 09/08/2015   superficial femoral  . BREAST EXCISIONAL BIOPSY Right 2007   positive  . BREAST SURGERY    . CARDIOVERSION  11/10/2011   Procedure: CARDIOVERSION;  Surgeon: Lelon Perla, MD;  Location: [place];  Service: Cardiovascular;  Laterality: N/A;  . CARDIOVERSION  08/26/2010  . COLONOSCOPY WITH PROPOFOL N/A 01/04/2016   Procedure: COLONOSCOPY WITH PROPOFOL;  Surgeon: Lollie Sails, MD;  Location: [facility] ENDOSCOPY;  Service: Endoscopy;  Laterality: N/A;  . HALLUX VALGUS CORRECTION    . JOINT REPLACEMENT     . LUMBAR DISC SURGERY    . MASTECTOMY Right 2013   positive  . PERIPHERAL VASCULAR CATHETERIZATION N/A 09/08/2015   Procedure: Abdominal Aortogram w/Lower Extremity;  Surgeon: Wellington Hampshire, MD;  Location: [place] CV LAB;  Service: Cardiovascular;  Laterality: N/A;  . PERIPHERAL VASCULAR CATHETERIZATION  09/08/2015   Procedure: Peripheral Vascular Balloon Angioplasty;  Surgeon: Wellington Hampshire, MD;  Location: [place] CV LAB;  Service: Cardiovascular;;  . REVISION TOTAL KNEE ARTHROPLASTY Right   . rt.ankle ulcer surgery  2013  . TEMPOROMANDIBULAR JOINT ARTHROPLASTY    . TMJ ARTHROPLASTY    . TONSILLECTOMY    . TOTAL KNEE ARTHROPLASTY Right      Home Meds: Prior to Admission medications   Medication Sig Start Date End Date Taking? Authorizing Provider  acetaminophen (TYLENOL) 500 MG tablet Take 500 mg by mouth every 6 (six) hours as needed (For pain.).   Yes Historical Provider, MD  amiodarone (PACERONE) 200 MG tablet Take 1 tablet (200 mg total) by mouth daily. 08/04/13  Yes Lelon Perla, MD  ascorbic acid (VITAMIN C) 500 MG tablet Take 500 mg by mouth daily.   Yes Historical Provider, MD  aspirin EC 81 MG tablet Take 1 tablet (81 mg total) by mouth daily. 02/26/14  Yes Lelon Perla, MD  calcium-vitamin D (OSCAL WITH D) 500-200 MG-UNIT per tablet Take 1 tablet by mouth daily with breakfast.   Yes Historical Provider, MD  cholecalciferol (VITAMIN D) 1000 units tablet Take 1,000 Units by mouth daily.   Yes Historical Provider, MD  docusate sodium (COLACE) 100 MG capsule Take 100 mg by mouth daily as needed for mild constipation.   Yes Historical Provider, MD  Iron-Vitamin C 65-125 MG TABS Take 1 tablet by mouth daily.   Yes Historical Provider, MD  levothyroxine (SYNTHROID, LEVOTHROID) 150 MCG tablet Take 150 mcg by mouth daily before breakfast.   Yes Historical Provider, MD  lisinopril (PRINIVIL,ZESTRIL) 10 MG tablet Take 10 mg by mouth daily. 08/15/14  Yes Historical  Provider, MD  metoprolol (LOPRESSOR) 100 MG tablet Take 0.5 tablets (50 mg total) by mouth 2 (two) times daily. 09/09/15  Yes Cheryln Manly, NP  Multiple Vitamin (MULTIVITAMIN) tablet Take 1 tablet by mouth daily.     Yes Historical Provider, MD  pantoprazole (PROTONIX) 40 MG tablet Take 1 tablet (40 mg total) by mouth daily. 10/01/15  Yes Wellington Hampshire, MD  potassium chloride (KLOR-CON) 10 MEQ CR tablet Take 20 mEq by mouth daily. PT TAKES TWO EXTRA TABLETS WITH EXTRA FLUID PILL   Yes Historical Provider, MD  rosuvastatin (CRESTOR) 10 MG tablet Take 1 tablet (10 mg total) by mouth daily. 12/29/15 03/28/16 Yes Erma Heritage, PA  spironolactone (ALDACTONE) 25 MG tablet Take 25 mg by mouth daily.   Yes Historical Provider, MD  temazepam (RESTORIL) 15 MG capsule Take 15 mg by mouth at bedtime as needed for  sleep.  07/24/15  Yes Historical Provider, MD  torsemide (DEMADEX) 20 MG tablet Take 1 tablet (20 mg total) by mouth daily. 09/09/15  Yes Cheryln Manly, NP  venlafaxine (EFFEXOR-XR) 75 MG 24 hr capsule Take 75 mg by mouth daily.     Yes Historical Provider, MD    Inpatient Medications:  . amiodarone  200 mg Oral Daily  . aspirin EC  81 mg Oral Daily  . budesonide  9 mg Oral Daily  . calcium-vitamin D  1 tablet Oral Q breakfast  . chlorhexidine  15 mL Mouth/Throat BID  . cholecalciferol  1,000 Units Oral Daily  . feeding supplement (ENSURE ENLIVE)  237 mL Oral TID BM  . ferrous sulfate  325 mg Oral Q breakfast  . heparin  5,000 Units Subcutaneous Q8H  . hydrALAZINE  25 mg Oral Q8H  . levothyroxine  150 mcg Oral QAC breakfast  . multivitamin with minerals  1 tablet Oral Daily  . pantoprazole  40 mg Oral QAC breakfast  . potassium chloride  20 mEq Oral Daily  . rosuvastatin  10 mg Oral Daily  . sodium chloride flush  3 mL Intravenous Q12H  . venlafaxine XR  75 mg Oral Daily  . ascorbic acid  500 mg Oral Daily   . sodium chloride 75 mL/hr at 01/14/16 0505    Allergies:    Allergies  Allergen Reactions  . Amlodipine Nausea And Vomiting  . Ciprofloxacin Nausea Only  . Doxycycline Itching and Other (See Comments)    shaky  . Lipitor [Atorvastatin] Other (See Comments)    Joint pain   . Morphine And Related Nausea And Vomiting  . Macrobid [Nitrofurantoin Macrocrystal] Rash  . Penicillins Rash and Other (See Comments)    Has patient had a PCN reaction causing immediate rash, facial/tongue/throat swelling, SOB or lightheadedness with hypotension: yes Has patient had a PCN reaction causing severe rash involving mucus membranes or skin necrosis: no Has patient had a PCN reaction that required hospitalization no Has patient had a PCN reaction occurring within the last 10 years: no If all of the above answers are "NO", then may proceed with Cephalosporin use.   . Sulfamethoxazole-Trimethoprim Rash    Social History   Social History  . Marital status: Married    Spouse name: N/A  . Number of children: 2  . Years of education: N/A   Occupational History  . Not on file.   Social History Main Topics  . Smoking status: Never Smoker  . Smokeless tobacco: Never Used  . Alcohol use No  . Drug use: No  . Sexual activity: No   Other Topics Concern  . Not on file   Social History Narrative  . No narrative on file     Family History  Problem Relation Age of Onset  . Heart attack Father   . Coronary artery disease Sister 66    MI  . Breast cancer Maternal Aunt      Review of Systems: Review of Systems  Constitutional: Positive for malaise/fatigue and weight loss. Negative for chills, diaphoresis and fever.  HENT: Negative for congestion.   Eyes: Negative for discharge and redness.  Respiratory: Negative for cough, hemoptysis, sputum production, shortness of breath and wheezing.   Cardiovascular: Negative for chest pain, palpitations, orthopnea, claudication, leg swelling and PND.  Gastrointestinal: Positive for abdominal pain, diarrhea and  nausea. Negative for blood in stool, heartburn, melena and vomiting.  Genitourinary: Negative for hematuria.  Musculoskeletal: Positive for falls.  Negative for myalgias.  Skin: Negative for rash.  Neurological: Positive for dizziness and weakness. Negative for tingling, tremors, sensory change, speech change, focal weakness and loss of consciousness.  Endo/Heme/Allergies: Does not bruise/bleed easily.  Psychiatric/Behavioral: Negative for substance abuse. The patient is not nervous/anxious.     Labs:  Recent Labs  01/12/16 0121  TROPONINI <0.03   Lab Results  Component Value Date   WBC 11.0 01/12/2016   HGB 12.9 01/12/2016   HCT 36.6 01/12/2016   MCV 95.3 01/12/2016   PLT 258 01/12/2016     Recent Labs Lab 01/14/16 0403  NA 144  K 3.9  CL 116*  CO2 24  BUN 17  CREATININE 1.01*  CALCIUM 8.5*  GLUCOSE 95   No results found for: CHOL, HDL, LDLCALC, TRIG No results found for: DDIMER  Radiology/Studies:  Ct Head Wo Contrast  Result Date: 01/12/2016 CLINICAL DATA:  Acute onset of dizziness, syncope and fall. Laceration at the left side of the face. Concern for head or cervical spine injury. Initial encounter. EXAM: CT HEAD WITHOUT CONTRAST CT CERVICAL SPINE WITHOUT CONTRAST TECHNIQUE: Multidetector CT imaging of the head and cervical spine was performed following the standard protocol without intravenous contrast. Multiplanar CT image reconstructions of the cervical spine were also generated. COMPARISON:  CT of the head performed 06/07/2015 FINDINGS: CT HEAD FINDINGS Brain: No evidence of acute infarction, hemorrhage, hydrocephalus, extra-axial collection or mass lesion/mass effect. Prominence of the ventricles and sulci reflects mild cortical volume loss. Mild cerebellar atrophy is noted. Scattered subcortical white matter change likely reflects small vessel ischemic microangiopathy. A small chronic lacunar infarct is noted at the posterior limb of the right internal capsule.  The brainstem and fourth ventricle are within normal limits. The basal ganglia are unremarkable in appearance. The cerebral hemispheres demonstrate grossly normal gray-white differentiation. No mass effect or midline shift is seen. Vascular: No hyperdense vessel or unexpected calcification. Skull: There is no evidence of fracture; postoperative change is noted about the right temporal bone. Sinuses/Orbits: The visualized portions of the orbits are within normal limits. There is partial opacification of the left maxillary sinus. The remaining paranasal sinuses and mastoid air cells are well-aerated. Other: Mild apparent soft tissue swelling is suggested overlying the left parietal calvarium. CT CERVICAL SPINE FINDINGS Alignment: There is mild grade 1 anterolisthesis of C3 on C4 and of C4 on C5, reflecting underlying facet disease. Remaining alignment is normal. Skull base and vertebrae: No acute fracture. No primary bone lesion or focal pathologic process. Soft tissues and spinal canal: No prevertebral fluid or swelling. No visible canal hematoma. Disc levels: Multilevel disc space narrowing is noted along the lower cervical spine, with scattered anterior and posterior disc osteophyte complexes, and endplate sclerotic change at C7-T1. Upper chest: Mild calcification is seen at the carotid bifurcations bilaterally. The visualized lung apices are grossly clear. The thyroid gland is diminutive but unremarkable in appearance. Other: No additional soft tissue abnormalities are seen. IMPRESSION: 1. No evidence of traumatic intracranial injury or fracture. 2. No evidence of fracture or subluxation along the cervical spine. 3. Mild apparent soft tissue swelling suggested overlying the left parietal calvarium. 4. Mild cortical volume loss and scattered small vessel ischemic microangiopathy. 5. Small chronic lacunar infarct at the posterior limb of the right internal capsule. 6. Partial opacification of the left maxillary  sinus. 7. Mild degenerative change along the cervical spine, with mild grade 1 anterolisthesis of C3 on C4 and of C4 on C5, reflecting underlying facet disease. 8.  Mild calcification at the carotid bifurcations bilaterally. Electronically Signed   By: Garald Balding M.D.   On: 01/12/2016 02:03   Ct Cervical Spine Wo Contrast  Result Date: 01/12/2016 CLINICAL DATA:  Acute onset of dizziness, syncope and fall. Laceration at the left side of the face. Concern for head or cervical spine injury. Initial encounter. EXAM: CT HEAD WITHOUT CONTRAST CT CERVICAL SPINE WITHOUT CONTRAST TECHNIQUE: Multidetector CT imaging of the head and cervical spine was performed following the standard protocol without intravenous contrast. Multiplanar CT image reconstructions of the cervical spine were also generated. COMPARISON:  CT of the head performed 06/07/2015 FINDINGS: CT HEAD FINDINGS Brain: No evidence of acute infarction, hemorrhage, hydrocephalus, extra-axial collection or mass lesion/mass effect. Prominence of the ventricles and sulci reflects mild cortical volume loss. Mild cerebellar atrophy is noted. Scattered subcortical white matter change likely reflects small vessel ischemic microangiopathy. A small chronic lacunar infarct is noted at the posterior limb of the right internal capsule. The brainstem and fourth ventricle are within normal limits. The basal ganglia are unremarkable in appearance. The cerebral hemispheres demonstrate grossly normal gray-white differentiation. No mass effect or midline shift is seen. Vascular: No hyperdense vessel or unexpected calcification. Skull: There is no evidence of fracture; postoperative change is noted about the right temporal bone. Sinuses/Orbits: The visualized portions of the orbits are within normal limits. There is partial opacification of the left maxillary sinus. The remaining paranasal sinuses and mastoid air cells are well-aerated. Other: Mild apparent soft tissue swelling  is suggested overlying the left parietal calvarium. CT CERVICAL SPINE FINDINGS Alignment: There is mild grade 1 anterolisthesis of C3 on C4 and of C4 on C5, reflecting underlying facet disease. Remaining alignment is normal. Skull base and vertebrae: No acute fracture. No primary bone lesion or focal pathologic process. Soft tissues and spinal canal: No prevertebral fluid or swelling. No visible canal hematoma. Disc levels: Multilevel disc space narrowing is noted along the lower cervical spine, with scattered anterior and posterior disc osteophyte complexes, and endplate sclerotic change at C7-T1. Upper chest: Mild calcification is seen at the carotid bifurcations bilaterally. The visualized lung apices are grossly clear. The thyroid gland is diminutive but unremarkable in appearance. Other: No additional soft tissue abnormalities are seen. IMPRESSION: 1. No evidence of traumatic intracranial injury or fracture. 2. No evidence of fracture or subluxation along the cervical spine. 3. Mild apparent soft tissue swelling suggested overlying the left parietal calvarium. 4. Mild cortical volume loss and scattered small vessel ischemic microangiopathy. 5. Small chronic lacunar infarct at the posterior limb of the right internal capsule. 6. Partial opacification of the left maxillary sinus. 7. Mild degenerative change along the cervical spine, with mild grade 1 anterolisthesis of C3 on C4 and of C4 on C5, reflecting underlying facet disease. 8. Mild calcification at the carotid bifurcations bilaterally. Electronically Signed   By: Garald Balding M.D.   On: 01/12/2016 02:03   Dg Chest Port 1 View  Result Date: 01/12/2016 CLINICAL DATA:  82 year old female with fall. EXAM: PORTABLE CHEST 1 VIEW COMPARISON:  Chest radiograph dated 05/18/2014 FINDINGS: The heart size and mediastinal contours are within normal limits. Both lungs are clear. The visualized skeletal structures are unremarkable. IMPRESSION: No active disease.  Electronically Signed   By: Anner Crete M.D.   On: 01/12/2016 03:16    EKG: Interpreted by me showed: not on file to review  Telemetry: Interpreted by me showed: NSR with PACs, 60's bpm  Weights: [employer]   01/12/16  [id number] 01/13/16 0500 01/14/16 0500  Weight: 130 lb 8 oz (59.2 kg) 132 lb 1.6 oz (59.9 kg) 136 lb 6.4 oz (61.9 kg)     Physical Exam: Blood pressure (!) 162/49, pulse 78, temperature 98.4 F (36.9 C), temperature source Oral, resp. rate 17, height 5\' 8"  (1.727 m), weight 136 lb 6.4 oz (61.9 kg), SpO2 96 %. Body mass index is 20.74 kg/m. General: Well developed, well nourished, in no acute distress. Head: Normocephalic, multiple contusions, sclera non-icteric, no xanthomas, nares are without discharge.  Neck: Negative for carotid bruits. JVD not elevated. Lungs: Clear bilaterally to auscultation without wheezes, rales, or rhonchi. Breathing is unlabored. Heart: RRR with S1 S2. No murmurs, rubs, or gallops appreciated. Abdomen: Soft, non-tender, non-distended with normoactive bowel sounds. No hepatomegaly. No rebound/guarding. No obvious abdominal masses. Msk:  Strength and tone appear normal for age. Extremities: No clubbing or cyanosis. No edema. Distal pedal pulses are 2+ and equal bilaterally. Neuro: Alert and oriented X 3. No facial asymmetry. No focal deficit. Moves all extremities spontaneously. Psych:  Responds to questions appropriately with a normal affect.    Assessment and Plan:  Principal Problem:   Orthostatic hypotension Active Problems:   Protein-calorie malnutrition, severe   ARF (acute renal failure) (HCC)   Dehydration   Diarrhea   Chronic diastolic congestive heart failure (HCC)   PAF (paroxysmal atrial fibrillation) (HCC)   Pressure injury of skin    1. Fall/orthostatic hypotension: -Likely in the setting of her profound diarrhea for multiple weeks leading to dehydration -IV fluids -Check orthostatics this morning to assess if she is  still volume depleted -Needs to ambulated prior to going home  2. PAF: -Has be in sinus rhythm with PACs this admission without evidence of Afib -Rate initially in the 50's upon arrival -Metoprolol and amiodarone have been held with improvement in her heart rate to the 60's bpm -She is at high risk of recurrent Afib with her continuing diarrhea and possible electrolyte imbalance -Would restart amiodarone, possibly 100 mg daily with close monitoring of her heart rate in an effort to maintain sinus rhythm  3. ARF: -In the setting of dehydration -Improving with IV fluids -Per IM  4. Diarrhea: -Recently colonoscopy with colitis -Per GI  5. Chronic diastolic CHF: -Does not appear to be volume overloaded at this time -Beta blocker on hold given bradycardia -Lasix on hold 2/2 ARF -Restart medications as able  6. HLD: -Statin   Signed, Christell Faith, PA-C [facility] Pager: [phone] 01/14/2016, 9:21 AM

## 2016-01-14 NOTE — Care Management (Signed)
Physical therapy evaluation completed. Recommends Home with Home Health and physical therapy. Discussed agencies in the home. [name].  Floydene Flock, [facility] representative updated.  Possible discharge today per Dr. Vianne Bulls. Shelbie Ammons RN MSN CCM Care management [phone]

## 2016-01-14 NOTE — Progress Notes (Signed)
Pt and husband given d/c instructions r/t activity, diet, follow up care, when to call MD, and medications, voiced understanding, informed that 4 prescriptions were called into [facility] and husband given prescription x 1. Voiced understanding, pt d/c home via wheelchair escorted by staff and family

## 2016-01-14 NOTE — Progress Notes (Signed)
[facility] NAME: [name]    MR#:  [id number]  DATE OF BIRTH:  1932/11/09  SUBJECTIVE:  Pt is doing better. Stable for d/c home today.spoke with cardiology PA Christell Faith.  CHIEF COMPLAINT:   Chief Complaint  Patient presents with  . Fall  . Facial Laceration    REVIEW OF SYSTEMS:   ROS CONSTITUTIONAL: No fever, fatigue or weakness.  EYES: No blurred or double vision. Facial laceration EARS, NOSE, AND THROAT: No tinnitus or ear pain.  RESPIRATORY: No cough, shortness of breath, wheezing or hemoptysis.  CARDIOVASCULAR: No chest pain, orthopnea, edema.  GASTROINTESTINAL: No nausea, vomiting, diarrhea or abdominal pain.  GENITOURINARY: No dysuria, hematuria.  ENDOCRINE: No polyuria, nocturia,  HEMATOLOGY: No anemia, easy bruising or bleeding SKIN: No rash or lesion. MUSCULOSKELETAL: No joint pain or arthritis.   NEUROLOGIC: No tingling, numbness, weakness.  PSYCHIATRY: No anxiety or depression.   DRUG ALLERGIES:   Allergies  Allergen Reactions  . Amlodipine Nausea And Vomiting  . Ciprofloxacin Nausea Only  . Doxycycline Itching and Other (See Comments)    shaky  . Lipitor [Atorvastatin] Other (See Comments)    Joint pain   . Morphine And Related Nausea And Vomiting  . Macrobid [Nitrofurantoin Macrocrystal] Rash  . Penicillins Rash and Other (See Comments)    Has patient had a PCN reaction causing immediate rash, facial/tongue/throat swelling, SOB or lightheadedness with hypotension: yes Has patient had a PCN reaction causing severe rash involving mucus membranes or skin necrosis: no Has patient had a PCN reaction that required hospitalization no Has patient had a PCN reaction occurring within the last 10 years: no If all of the above answers are "NO", then may proceed with Cephalosporin use.   . Sulfamethoxazole-Trimethoprim Rash    VITALS:  Blood pressure (!) 162/49, pulse 78, temperature 98.4 F (36.9  C), temperature source Oral, resp. rate 17, height 5\' 8"  (1.727 m), weight 61.9 kg (136 lb 6.4 oz), SpO2 96 %.  PHYSICAL EXAMINATION:  GENERAL:  82 y.o.-year-old patient lying in the bed with no acute distress.  EYES: Pupils equal, round, reactive to light and accommodation. No scleral icterus. Extraocular muscles intact. Facial laceration. HEENT: Head atraumatic, normocephalic. Oropharynx and nasopharynx clear.  NECK:  Supple, no jugular venous distention. No thyroid enlargement, no tenderness.  LUNGS: Normal breath sounds bilaterally, no wheezing, rales,rhonchi or crepitation. No use of accessory muscles of respiration.  CARDIOVASCULAR: S1, S2 normal. No murmurs, rubs, or gallops.  ABDOMEN: Soft, nontender, nondistended. Bowel sounds present. No organomegaly or mass.  EXTREMITIES: No pedal edema, cyanosis, or clubbing.  NEUROLOGIC: Cranial nerves II through XII are intact. Muscle strength 5/5 in all extremities. Sensation intact. Gait not checked.  PSYCHIATRIC: The patient is alert and oriented x 3.  SKIN: No obvious rash, lesion, or ulcer.    LABORATORY PANEL:   CBC  Recent Labs Lab 01/12/16 0121  WBC 11.0  HGB 12.9  HCT 36.6  PLT 258   ------------------------------------------------------------------------------------------------------------------  Chemistries   Recent Labs Lab 01/14/16 0403  NA 144  K 3.9  CL 116*  CO2 24  GLUCOSE 95  BUN 17  CREATININE 1.01*  CALCIUM 8.5*   ------------------------------------------------------------------------------------------------------------------  Cardiac Enzymes  Recent Labs Lab 01/12/16 0121  TROPONINI <0.03   ------------------------------------------------------------------------------------------------------------------  RADIOLOGY:  No results found.  EKG:   Orders placed or performed during the hospital encounter of 01/12/16  . ED EKG  . ED EKG  ASSESSMENT AND PLAN:  Syncope secondary to acute  renal failure: Improved.  Acute renal failure secondary to ATN'improving with IV hydration #3 .bradycardia in patient with atrial fibrillation: Heart rate much better but we're holding the amiodarone, metoprolol. Seen by cardio.d/c home only on amiodarone.continue to hold metoprolol.added hydrallazine for bp . #4 chronic diarrhea: Patient found to have collagenous colitis, started on enticort by GI. #5 deconditioning; physical therapy recommended home health physical therapy #6 uncontrolled hypertension: Controlled at this time. D/c home today D/w nurse and patient  All the records are reviewed and case discussed with Care Management/Social Workerr. Management plans discussed with the patient, family and they are in agreement.  CODE STATUS: full  TOTAL TIME TAKING CARE OF THIS PATIENT: 23minutes.    Epifanio Lesches M.D on 01/14/2016 at 4:26 PM  Between 7am to 6pm - Pager - [phone]  After 6pm go to [email or web address] - password [email or web address] [facility]  Office  [phone]  CC: Primary care physician; Rusty Aus, MD   Note: This dictation was prepared with Dragon dictation along with smaller phrase technology. Any transcriptional errors that result from this process are unintentional.

## 2016-01-14 NOTE — Care Management Important Message (Signed)
Important Message  Patient Details  Name: Dilpreet Faires Hamblin MRN: [id number] Date of Birth: 01/28/1933   Medicare Important Message Given:  Yes    Shelbie Ammons, RN 01/14/2016, 8:17 AM

## 2016-01-15 ENCOUNTER — Encounter: Payer: Self-pay | Admitting: Emergency Medicine

## 2016-01-15 ENCOUNTER — Emergency Department
Admission: EM | Admit: 2016-01-15 | Discharge: 2016-01-15 | Disposition: A | Discharge status: Home / Self Care | Payer: PPO | Attending: Student in an Organized Health Care Education/Training Program | Admitting: Student in an Organized Health Care Education/Training Program

## 2016-01-15 DIAGNOSIS — Z7982 Long term (current) use of aspirin: Secondary | ICD-10-CM | POA: Insufficient documentation

## 2016-01-15 DIAGNOSIS — L03211 Cellulitis of face: Secondary | ICD-10-CM | POA: Diagnosis not present

## 2016-01-15 DIAGNOSIS — I10 Essential (primary) hypertension: Secondary | ICD-10-CM | POA: Diagnosis not present

## 2016-01-15 DIAGNOSIS — Z79899 Other long term (current) drug therapy: Secondary | ICD-10-CM | POA: Diagnosis not present

## 2016-01-15 DIAGNOSIS — R220 Localized swelling, mass and lump, head: Secondary | ICD-10-CM

## 2016-01-15 DIAGNOSIS — E039 Hypothyroidism, unspecified: Secondary | ICD-10-CM | POA: Diagnosis not present

## 2016-01-15 DIAGNOSIS — J449 Chronic obstructive pulmonary disease, unspecified: Secondary | ICD-10-CM | POA: Diagnosis not present

## 2016-01-15 DIAGNOSIS — Z853 Personal history of malignant neoplasm of breast: Secondary | ICD-10-CM | POA: Insufficient documentation

## 2016-01-15 MED ORDER — LIDOCAINE-EPINEPHRINE-TETRACAINE (LET) SOLUTION
NASAL | Status: AC
  Administered 2016-01-15: 3 mL
  Filled 2016-01-15: qty 3

## 2016-01-15 MED ORDER — ACETAMINOPHEN 500 MG PO TABS
1000.0000 mg | ORAL_TABLET | Freq: Once | ORAL | Status: AC
  Administered 2016-01-15: 1000 mg via ORAL
  Filled 2016-01-15: qty 2

## 2016-01-15 MED ORDER — DOXYCYCLINE HYCLATE 100 MG PO TABS
100.0000 mg | ORAL_TABLET | Freq: Once | ORAL | Status: AC
  Administered 2016-01-15: 100 mg via ORAL
  Filled 2016-01-15: qty 1

## 2016-01-15 MED ORDER — LIDOCAINE-EPINEPHRINE-TETRACAINE (LET) SOLUTION: 3.0000 mL | Freq: Once | NASAL | Status: DC

## 2016-01-15 MED ORDER — DOXYCYCLINE HYCLATE 50 MG PO CAPS: 100.0000 mg | ORAL_CAPSULE | Freq: Two times a day (BID) | ORAL | 0 refills | Status: DC

## 2016-01-15 NOTE — ED Triage Notes (Signed)
Pt to ed with c/o facial swelling and drainage since fall.  Pt states she was d/c from here less than 24 hours ago and has been unable to eat or drink anything.  Pt alert and oriented at this time.  Pt with noted bruising and swelling to face.

## 2016-01-15 NOTE — ED Notes (Signed)
Pt presents to the ED with complaints of "oozing and swelling" at her chin. States that she was discharged home from the hospital yesterday after falling on Tuesday. Healing lacerations are noted to patients face. The swollen area is red and warm. Significant swelling noted to patients face. Pt is afebrile but does complain of chills. Pt states she is unable to eat due to the pain in her mouth and throat. O2 saturations at 100% on room air.

## 2016-01-15 NOTE — Discharge Summary (Signed)
[name], is a 82 y.o. female  DOB [birth year]  MRN [id number].  Admission date:  01/12/2016  Admitting Physician  Harrie Foreman, MD  Discharge Date:  9/ 29/2017   Primary MD  Rusty Aus, MD  Recommendations for primary care physician for things to follow:   Follow up with primary doctor in one week Follow up with Dr. Fletcher Anon  in 1 week   Admission Diagnosis  Fall Mouth Laceration   Discharge Diagnosis  Fall Mouth Laceration   Principal Problem:   Orthostatic hypotension Active Problems:   PAF (paroxysmal atrial fibrillation) (HCC)   Chronic diastolic congestive heart failure (HCC)   Pressure injury of skin   Protein-calorie malnutrition, severe   ARF (acute renal failure) (HCC)   Dehydration   Diarrhea      Past Medical History:  Diagnosis Date  . Arthritis    "back, hands" (09/08/2015)  . Atrial fibrillation ([place])    Echocardiogram 5/12: normal LV function, moderate LAE, mild RAE, mild AI and MR and mild-to-moderate TR.  Myoview 5/ 12 showed an ejection fraction of 73% and normal perfusion.   Patient underwent elective DCCV on 08/26/10  . Breast cancer ([place]) 2007, 2013   right breast- 2007 radiation  . Cancer of right breast ([place])   . Chronic lower back pain   . Complication of anesthesia    "took me about 1 week to know what was going on after one of my knee ORs"  . COPD (chronic obstructive pulmonary disease) ([place])   . DDD (degenerative disc disease), lumbar   . 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 Dysrhythmia   . GERD (gastroesophageal reflux disease)   . Hyperlipidemia   . Hypertension   . Hypothyroid   . PAD (peripheral artery disease) ([place])   . PVD (peripheral vascular disease) ([place])     Past Surgical History:  Procedure Laterality Date  . ABDOMINAL EXPLORATION  SURGERY     "had to go back in after hysterectomy & check on stitch dr had put near my bladder; don't know if they took it out; had to wear catheter for 1 month"  . ABDOMINAL HYSTERECTOMY    . APPENDECTOMY    . BACK SURGERY    . BALLOON ANGIOPLASTY, ARTERY Right 09/08/2015   superficial femoral  . BREAST EXCISIONAL BIOPSY Right 2007   positive  . BREAST SURGERY    . CARDIOVERSION  11/10/2011   Procedure: CARDIOVERSION;  Surgeon: Lelon Perla, MD;  Location: [facility];  Service: Cardiovascular;  Laterality: N/A;  . CARDIOVERSION  08/26/2010  . COLONOSCOPY WITH PROPOFOL N/A 01/04/2016   Procedure: COLONOSCOPY WITH PROPOFOL;  Surgeon: Lollie Sails, MD;  Location[facility] ENDOSCOPY;  Service: Endoscopy;  Laterality: N/A;  . HALLUX VALGUS CORRECTION    . JOINT REPLACEMENT    . LUMBAR DISC SURGERY    . MASTECTOMY Right 2013   positive  . PERIPHERAL VASCULAR CATHETERIZATION N/A 09/08/2015   Procedure: Abdominal Aortogram w/Lower Extremity;  Surgeon: Wellington Hampshire, MD;  Location: [place] [facility];  Service: Cardiovascular;  Laterality: N/A;  . PERIPHERAL VASCULAR CATHETERIZATION  09/08/2015   Procedure: Peripheral Vascular Balloon Angioplasty;  Surgeon: Wellington Hampshire, MD;  Location: [place] [facility];  Service: Cardiovascular;;  . REVISION TOTAL KNEE ARTHROPLASTY Right   . rt.ankle ulcer surgery  2013  . TEMPOROMANDIBULAR JOINT ARTHROPLASTY    . TMJ ARTHROPLASTY    . TONSILLECTOMY    .  TOTAL KNEE ARTHROPLASTY Right        History of present illness and  Hospital Course:     Kindly see H&P for history of present illness and admission details, please review complete Labs, Consult reports and Test reports for all details in brief  HPI  from the history and physical done on the day of admission  82 year old female patient with history of hypertension, chronic atrial fibrillation came in because of the fall and suffered a severe laceration. Admitted for syncope  evaluation.  Hospital Course  #1 syncope secondary to hypotension, acute renal failure: Head CT unremarkable. Ultrasound of carotids did not show any neurological significant stenosis. Patient seen by physical therapy they recommended home health physical therapy. Syncope thought to be secondary to acute renal failure.  #2 acute renal failure secondary to ATN from dehydration and hypotension: Improved with IV hydration, monitored on telemetry. Renal function improved from 3.19-1.01.  #3 bradycardia in a patient with history of atrial fibrillation: Patient was on amiodarone, metoprolol. Followed by cardiology from [facility]. We had  To  stop her amiodarone and metoprolol  because heart rate was in 50s. Cardiology consult obtained. Recommended discontinuing the dose of amiodarone 200 mg daily, stopping the  metoprolol altogether. Admitted the hydralazine for blood pressure control. Patient can follow up with [facility] cardiology with Dr. Fletcher Anon  in 1 week to 10 days.  #4 chronic diarrhea with collagenous colitis: Patient started on Entocort by GI, give prescription. Patient  To follow up with  with GI as an outpatient with Dr. Gustavo Lah   #5  Fall at home with . Laceration on the skin on the right side of the face, right arm. Required pain medication.   Discharge Condition;stable   Follow UP  Follow-up Information    Kathlyn Sacramento, MD .   Specialty:  Cardiology Why:  Call Dr. Tyrell Antonio office for followup appointment  Contact information: [place] [address] [phone]             Discharge Instructions  and  Discharge Medications     Discharge Instructions    Face-to-face encounter (required for Medicare/Medicaid patients)    Complete by:  As directed    I [facility] certify that this patient is under my care and that I, or a nurse practitioner or physician's assistant working with me, had a face-to-face encounter that meets the  physician face-to-face encounter requirements with this patient on 01/14/2016. The encounter with the patient was in whole, or in part for the following medical condition(s) which is the primary reason for home health care  Acute renal failure' deconditoning   The encounter with the patient was in whole, or in part, for the following medical condition, which is the primary reason for home health care:  whole   I certify that, based on my findings, the following services are medically necessary home health services:  Physical therapy   Reason for Medically Necessary Home Health Services:  Therapy- Personnel officer, Public librarian   My clinical findings support the need for the above services:  Unsafe ambulation due to balance issues   Further, I certify that my clinical findings support that this patient is homebound due to:  Ambulates short distances less than 300 feet   Home Health    Complete by:  As directed    To provide the following care/treatments:  PT       Medication List    STOP  taking these medications   metoprolol 100 MG tablet Commonly known as:  LOPRESSOR   potassium chloride 10 MEQ CR tablet Commonly known as:  KLOR-CON   torsemide 20 MG tablet Commonly known as:  DEMADEX     TAKE these medications   acetaminophen 500 MG tablet Commonly known as:  TYLENOL Take 500 mg by mouth every 6 (six) hours as needed (For pain.).   amiodarone 200 MG tablet Commonly known as:  PACERONE Take 1 tablet (200 mg total) by mouth daily.   ascorbic acid 500 MG tablet Commonly known as:  VITAMIN C Take 500 mg by mouth daily.   aspirin EC 81 MG tablet Take 1 tablet (81 mg total) by mouth daily.   budesonide 3 MG 24 hr capsule Commonly known as:  ENTOCORT EC Take 3 capsules (9 mg total) by mouth daily.   calcium-vitamin D 500-200 MG-UNIT tablet Commonly known as:  OSCAL WITH D Take 1 tablet by mouth daily with breakfast.   cholecalciferol 1000 units  tablet Commonly known as:  VITAMIN D Take 1,000 Units by mouth daily.   docusate sodium 100 MG capsule Commonly known as:  COLACE Take 100 mg by mouth daily as needed for mild constipation.   feeding supplement (ENSURE ENLIVE) Liqd Take 237 mLs by mouth 3 (three) times daily between meals.   hydrALAZINE 25 MG tablet Commonly known as:  APRESOLINE Take 1 tablet (25 mg total) by mouth every 8 (eight) hours.   Iron-Vitamin C 65-125 MG Tabs Take 1 tablet by mouth daily.   levothyroxine 150 MCG tablet Commonly known as:  SYNTHROID, LEVOTHROID Take 150 mcg by mouth daily before breakfast.   lisinopril 10 MG tablet Commonly known as:  PRINIVIL,ZESTRIL Take 10 mg by mouth daily.   metaxalone 800 MG tablet Commonly known as:  SKELAXIN Take 1 tablet (800 mg total) by mouth 3 (three) times daily.   multivitamin tablet Take 1 tablet by mouth daily.   oxyCODONE-acetaminophen 5-325 MG tablet Commonly known as:  PERCOCET/ROXICET Take 1 tablet by mouth every 6 (six) hours as needed for moderate pain or severe pain.   pantoprazole 40 MG tablet Commonly known as:  PROTONIX Take 1 tablet (40 mg total) by mouth daily.   rosuvastatin 10 MG tablet Commonly known as:  CRESTOR Take 1 tablet (10 mg total) by mouth daily.   spironolactone 25 MG tablet Commonly known as:  ALDACTONE Take 25 mg by mouth daily.   temazepam 15 MG capsule Commonly known as:  RESTORIL Take 15 mg by mouth at bedtime as needed for sleep.   venlafaxine XR 75 MG 24 hr capsule Commonly known as:  EFFEXOR-XR Take 75 mg by mouth daily.         Diet and Activity recommendation: See Discharge Instructions above   Consults obtained - cardio,PT   Major procedures and Radiology Reports - PLEASE review detailed and final reports for all details, in brief -     Ct Head Wo Contrast  Result Date: 01/12/2016 CLINICAL DATA:  Acute onset of dizziness, syncope and fall. Laceration at the left side of the face.  Concern for head or cervical spine injury. Initial encounter. EXAM: CT HEAD WITHOUT CONTRAST CT CERVICAL SPINE WITHOUT CONTRAST TECHNIQUE: Multidetector CT imaging of the head and cervical spine was performed following the standard protocol without intravenous contrast. Multiplanar CT image reconstructions of the cervical spine were also generated. COMPARISON:  CT of the head performed 06/07/2015 FINDINGS: CT HEAD FINDINGS Brain: No evidence of  acute infarction, hemorrhage, hydrocephalus, extra-axial collection or mass lesion/mass effect. Prominence of the ventricles and sulci reflects mild cortical volume loss. Mild cerebellar atrophy is noted. Scattered subcortical white matter change likely reflects small vessel ischemic microangiopathy. A small chronic lacunar infarct is noted at the posterior limb of the right internal capsule. The brainstem and fourth ventricle are within normal limits. The basal ganglia are unremarkable in appearance. The cerebral hemispheres demonstrate grossly normal gray-white differentiation. No mass effect or midline shift is seen. Vascular: No hyperdense vessel or unexpected calcification. Skull: There is no evidence of fracture; postoperative change is noted about the right temporal bone. Sinuses/Orbits: The visualized portions of the orbits are within normal limits. There is partial opacification of the left maxillary sinus. The remaining paranasal sinuses and mastoid air cells are well-aerated. Other: Mild apparent soft tissue swelling is suggested overlying the left parietal calvarium. CT CERVICAL SPINE FINDINGS Alignment: There is mild grade 1 anterolisthesis of C3 on C4 and of C4 on C5, reflecting underlying facet disease. Remaining alignment is normal. Skull base and vertebrae: No acute fracture. No primary bone lesion or focal pathologic process. Soft tissues and spinal canal: No prevertebral fluid or swelling. No visible canal hematoma. Disc levels: Multilevel disc space  narrowing is noted along the lower cervical spine, with scattered anterior and posterior disc osteophyte complexes, and endplate sclerotic change at C7-T1. Upper chest: Mild calcification is seen at the carotid bifurcations bilaterally. The visualized lung apices are grossly clear. The thyroid gland is diminutive but unremarkable in appearance. Other: No additional soft tissue abnormalities are seen. IMPRESSION: 1. No evidence of traumatic intracranial injury or fracture. 2. No evidence of fracture or subluxation along the cervical spine. 3. Mild apparent soft tissue swelling suggested overlying the left parietal calvarium. 4. Mild cortical volume loss and scattered small vessel ischemic microangiopathy. 5. Small chronic lacunar infarct at the posterior limb of the right internal capsule. 6. Partial opacification of the left maxillary sinus. 7. Mild degenerative change along the cervical spine, with mild grade 1 anterolisthesis of C3 on C4 and of C4 on C5, reflecting underlying facet disease. 8. Mild calcification at the carotid bifurcations bilaterally. Electronically Signed   By: Garald Balding M.D.   On: 01/12/2016 02:03   Ct Cervical Spine Wo Contrast  Result Date: 01/12/2016 CLINICAL DATA:  Acute onset of dizziness, syncope and fall. Laceration at the left side of the face. Concern for head or cervical spine injury. Initial encounter. EXAM: CT HEAD WITHOUT CONTRAST CT CERVICAL SPINE WITHOUT CONTRAST TECHNIQUE: Multidetector CT imaging of the head and cervical spine was performed following the standard protocol without intravenous contrast. Multiplanar CT image reconstructions of the cervical spine were also generated. COMPARISON:  CT of the head performed 06/07/2015 FINDINGS: CT HEAD FINDINGS Brain: No evidence of acute infarction, hemorrhage, hydrocephalus, extra-axial collection or mass lesion/mass effect. Prominence of the ventricles and sulci reflects mild cortical volume loss. Mild cerebellar atrophy  is noted. Scattered subcortical white matter change likely reflects small vessel ischemic microangiopathy. A small chronic lacunar infarct is noted at the posterior limb of the right internal capsule. The brainstem and fourth ventricle are within normal limits. The basal ganglia are unremarkable in appearance. The cerebral hemispheres demonstrate grossly normal gray-white differentiation. No mass effect or midline shift is seen. Vascular: No hyperdense vessel or unexpected calcification. Skull: There is no evidence of fracture; postoperative change is noted about the right temporal bone. Sinuses/Orbits: The visualized portions of the orbits are within normal limits. There  is partial opacification of the left maxillary sinus. The remaining paranasal sinuses and mastoid air cells are well-aerated. Other: Mild apparent soft tissue swelling is suggested overlying the left parietal calvarium. CT CERVICAL SPINE FINDINGS Alignment: There is mild grade 1 anterolisthesis of C3 on C4 and of C4 on C5, reflecting underlying facet disease. Remaining alignment is normal. Skull base and vertebrae: No acute fracture. No primary bone lesion or focal pathologic process. Soft tissues and spinal canal: No prevertebral fluid or swelling. No visible canal hematoma. Disc levels: Multilevel disc space narrowing is noted along the lower cervical spine, with scattered anterior and posterior disc osteophyte complexes, and endplate sclerotic change at C7-T1. Upper chest: Mild calcification is seen at the carotid bifurcations bilaterally. The visualized lung apices are grossly clear. The thyroid gland is diminutive but unremarkable in appearance. Other: No additional soft tissue abnormalities are seen. IMPRESSION: 1. No evidence of traumatic intracranial injury or fracture. 2. No evidence of fracture or subluxation along the cervical spine. 3. Mild apparent soft tissue swelling suggested overlying the left parietal calvarium. 4. Mild cortical  volume loss and scattered small vessel ischemic microangiopathy. 5. Small chronic lacunar infarct at the posterior limb of the right internal capsule. 6. Partial opacification of the left maxillary sinus. 7. Mild degenerative change along the cervical spine, with mild grade 1 anterolisthesis of C3 on C4 and of C4 on C5, reflecting underlying facet disease. 8. Mild calcification at the carotid bifurcations bilaterally. Electronically Signed   By: Garald Balding M.D.   On: 01/12/2016 02:03   Dg Chest Port 1 View  Result Date: 01/12/2016 CLINICAL DATA:  82 year old female with fall. EXAM: PORTABLE CHEST 1 VIEW COMPARISON:  Chest radiograph dated 05/18/2014 FINDINGS: The heart size and mediastinal contours are within normal limits. Both lungs are clear. The visualized skeletal structures are unremarkable. IMPRESSION: No active disease. Electronically Signed   By: Anner Crete M.D.   On: 01/12/2016 03:16    Micro Results     No results found for this or any previous visit (from the past 240 hour(s)).     Today   Subjective:   [name] today has no headache,no chest abdominal pain,no new weakness tingling or numbness, feels much better wants to go home today.   Objective:   Blood pressure (!) 162/49, pulse 78, temperature 98.4 F (36.9 C), temperature source Oral, resp. rate 17, height 5\' 8"  (1.727 m), weight 61.9 kg (136 lb 6.4 oz), SpO2 96 %.   Intake/Output Summary (Last 24 hours) at 01/15/16 1034 Last data filed at 01/14/16 1300  Gross per 24 hour  Intake              240 ml  Output                0 ml  Net              240 ml    Exam Awake Alert, Oriented x 3, No new F.N deficits, Normal affect [place].AT,PERRAL Supple Neck,No JVD, No cervical lymphadenopathy appriciated.  Symmetrical Chest wall movement, Good air movement bilaterally, CTAB RRR,No Gallops,Rubs or new Murmurs, No Parasternal Heave +ve B.Sounds, Abd Soft, Non tender, No organomegaly appriciated, No rebound  -guarding or rigidity. No Cyanosis, Clubbing or edema, No new Rash or bruise  Data Review   CBC w Diff: Lab Results  Component Value Date   WBC 11.0 01/12/2016   HGB 12.9 01/12/2016   HGB 10.0 (L) 09/04/2013   HCT 36.6 01/12/2016  HCT WILL FOLLOW 08/30/2015   PLT 258 01/12/2016   PLT WILL FOLLOW 08/30/2015   LYMPHOPCT 20 09/01/2015   LYMPHOPCT 13.2 09/04/2013   MONOPCT 12 09/01/2015   MONOPCT 10.2 09/04/2013   EOSPCT 2 09/01/2015   EOSPCT 2.4 09/04/2013   BASOPCT 1 09/01/2015   BASOPCT 0.8 09/04/2013    CMP: Lab Results  Component Value Date   NA 144 01/14/2016   NA 142 08/30/2015   NA 136 09/04/2013   K 3.9 01/14/2016   K 3.6 09/04/2013   CL 116 (H) 01/14/2016   CL 102 09/04/2013   CO2 24 01/14/2016   CO2 30 09/04/2013   BUN 17 01/14/2016   BUN 29 (H) 08/30/2015   BUN 23 (H) 09/04/2013   CREATININE 1.01 (H) 01/14/2016   CREATININE 1.26 (H) 03/31/2014   PROT 6.8 09/01/2015   PROT 6.1 (L) 09/04/2013   ALBUMIN 3.6 09/01/2015   ALBUMIN 2.2 (L) 09/04/2013   BILITOT 0.7 09/01/2015   BILITOT 0.3 09/04/2013   ALKPHOS 50 09/01/2015   ALKPHOS 105 09/04/2013   AST 48 (H) 09/01/2015   AST 71 (H) 09/04/2013   ALT 45 09/01/2015   ALT 48 09/04/2013  .   Total Time in preparing paper work, data evaluation and todays exam - 45 minutes  Jasten Guyette M.D on 9/ 29/2017 at 10:34 AM    Note: This dictation was prepared with Dragon dictation along with smaller phrase technology. Any transcriptional errors that result from this process are unintentional.

## 2016-01-15 NOTE — ED Provider Notes (Signed)
[facility] Emergency Department Provider Note    None    (approximate)  I have reviewed the triage vital signs and the nursing notes.   HISTORY  Chief Complaint Facial Swelling    HPI [name] is a 82 y.o. female  presents with swelling and erythema to her left chin. Past 24 hours. Patient was discharged from the hospital after evaluation of a fall and a KI. Patient had multiple facial lacerations were repaired in the ER. Denies any fevers. States that today her left jaw was more swollen and had some purulent drainage was seen at the walk-in clinic clinic. She was sent to the ER due to concern for abscess.   Past Medical History:  Diagnosis Date  . Arthritis    "back, hands" (09/08/2015)  . Atrial fibrillation ([place])    Echocardiogram 5/12: normal LV function, moderate LAE, mild RAE, mild AI and MR and mild-to-moderate TR.  Myoview 5/ 12 showed an ejection fraction of 73% and normal perfusion.   Patient underwent elective DCCV on 08/26/10  . Breast cancer ([place]) 2007, 2013   right breast- 2007 radiation  . Cancer of right breast ([place])   . Chronic lower back pain   . Complication of anesthesia    "took me about 1 week to know what was going on after one of my knee ORs"  . COPD (chronic obstructive pulmonary disease) ([place])   . DDD (degenerative disc disease), lumbar   . 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 Dysrhythmia   . GERD (gastroesophageal reflux disease)   . Hyperlipidemia   . Hypertension   . Hypothyroid   . PAD (peripheral artery disease) ([place])   . PVD (peripheral vascular disease) [facility])     Patient Active Problem List   Diagnosis Date Noted  . Orthostatic hypotension 01/14/2016  . ARF (acute renal failure) ([place]) 01/14/2016  . Dehydration 01/14/2016  . Diarrhea 01/14/2016  . Pressure injury of skin 01/12/2016  . Protein-calorie malnutrition, severe 01/12/2016  . Ductal carcinoma in situ (DCIS) of right breast  09/01/2015  . PAD (peripheral artery disease) ([place]) 08/10/2014  . Claudication ([place]) 06/30/2014  . Chronic diastolic congestive heart failure ([place]) 02/26/2014  . Edema 07/28/2013  . Preop cardiovascular exam 07/28/2013  . Renal insufficiency 09/14/2011  . PAF (paroxysmal atrial fibrillation) ([place]) 08/09/2010  . Hypertension 08/09/2010  . Chest pain 08/09/2010    Past Surgical History:  Procedure Laterality Date  . ABDOMINAL EXPLORATION SURGERY     "had to go back in after hysterectomy & check on stitch dr had put near my bladder; don't know if they took it out; had to wear catheter for 1 month"  . ABDOMINAL HYSTERECTOMY    . APPENDECTOMY    . BACK SURGERY    . BALLOON ANGIOPLASTY, ARTERY Right 09/08/2015   superficial femoral  . BREAST EXCISIONAL BIOPSY Right 2007   positive  . BREAST SURGERY    . CARDIOVERSION  11/10/2011   Procedure: CARDIOVERSION;  Surgeon: Lelon Perla, MD;  Location: [place];  Service: Cardiovascular;  Laterality: N/A;  . CARDIOVERSION  08/26/2010  . COLONOSCOPY WITH PROPOFOL N/A 01/04/2016   Procedure: COLONOSCOPY WITH PROPOFOL;  Surgeon: Lollie Sails, MD;  Location[facility]COPY;  Service: Endoscopy;  Laterality: N/A;  . HALLUX VALGUS CORRECTION    . JOINT REPLACEMENT    . LUMBAR DISC SURGERY    . MASTECTOMY Right 2013   positive  . PERIPHERAL VASCULAR  CATHETERIZATION N/A 09/08/2015   Procedure: Abdominal Aortogram w/Lower Extremity;  Surgeon: Wellington Hampshire, MD;  Location: [place] CV LAB;  Service: Cardiovascular;  Laterality: N/A;  . PERIPHERAL VASCULAR CATHETERIZATION  09/08/2015   Procedure: Peripheral Vascular Balloon Angioplasty;  Surgeon: Wellington Hampshire, MD;  Location: [place] CV LAB;  Service: Cardiovascular;;  . REVISION TOTAL KNEE ARTHROPLASTY Right   . rt.ankle ulcer surgery  2013  . TEMPOROMANDIBULAR JOINT ARTHROPLASTY    . TMJ ARTHROPLASTY    . TONSILLECTOMY    . TOTAL KNEE ARTHROPLASTY Right     Prior to Admission  medications   Medication Sig Start Date End Date Taking? Authorizing Provider  acetaminophen (TYLENOL) 500 MG tablet Take 500 mg by mouth every 6 (six) hours as needed (For pain.).    Historical Provider, MD  amiodarone (PACERONE) 200 MG tablet Take 1 tablet (200 mg total) by mouth daily. 08/04/13   Lelon Perla, MD  ascorbic acid (VITAMIN C) 500 MG tablet Take 500 mg by mouth daily.    Historical Provider, MD  aspirin EC 81 MG tablet Take 1 tablet (81 mg total) by mouth daily. 02/26/14   Lelon Perla, MD  budesonide (ENTOCORT EC) 3 MG 24 hr capsule Take 3 capsules (9 mg total) by mouth daily. 01/15/16   Epifanio Lesches, MD  calcium-vitamin D (OSCAL WITH D) 500-200 MG-UNIT per tablet Take 1 tablet by mouth daily with breakfast.    Historical Provider, MD  cholecalciferol (VITAMIN D) 1000 units tablet Take 1,000 Units by mouth daily.    Historical Provider, MD  docusate sodium (COLACE) 100 MG capsule Take 100 mg by mouth daily as needed for mild constipation.    Historical Provider, MD  feeding supplement, ENSURE ENLIVE, (ENSURE ENLIVE) LIQD Take 237 mLs by mouth 3 (three) times daily between meals. 01/14/16   Epifanio Lesches, MD  hydrALAZINE (APRESOLINE) 25 MG tablet Take 1 tablet (25 mg total) by mouth every 8 (eight) hours. 01/14/16   Epifanio Lesches, MD  Iron-Vitamin C 65-125 MG TABS Take 1 tablet by mouth daily.    Historical Provider, MD  levothyroxine (SYNTHROID, LEVOTHROID) 150 MCG tablet Take 150 mcg by mouth daily before breakfast.    Historical Provider, MD  lisinopril (PRINIVIL,ZESTRIL) 10 MG tablet Take 10 mg by mouth daily. 08/15/14   Historical Provider, MD  metaxalone (SKELAXIN) 800 MG tablet Take 1 tablet (800 mg total) by mouth 3 (three) times daily. 01/14/16   Epifanio Lesches, MD  Multiple Vitamin (MULTIVITAMIN) tablet Take 1 tablet by mouth daily.      Historical Provider, MD  oxyCODONE-acetaminophen (PERCOCET/ROXICET) 5-325 MG tablet Take 1 tablet by mouth every  6 (six) hours as needed for moderate pain or severe pain. 01/14/16   Epifanio Lesches, MD  pantoprazole (PROTONIX) 40 MG tablet Take 1 tablet (40 mg total) by mouth daily. 10/01/15   Wellington Hampshire, MD  rosuvastatin (CRESTOR) 10 MG tablet Take 1 tablet (10 mg total) by mouth daily. 12/29/15 03/28/16  Erma Heritage, PA  spironolactone (ALDACTONE) 25 MG tablet Take 25 mg by mouth daily.    Historical Provider, MD  temazepam (RESTORIL) 15 MG capsule Take 15 mg by mouth at bedtime as needed for sleep.  07/24/15   Historical Provider, MD  venlafaxine (EFFEXOR-XR) 75 MG 24 hr capsule Take 75 mg by mouth daily.      Historical Provider, MD    Allergies Amlodipine; Ciprofloxacin; Doxycycline; Lipitor [atorvastatin]; Morphine and related; Macrobid [nitrofurantoin macrocrystal]; Penicillins; and  Sulfamethoxazole-trimethoprim  Family History  Problem Relation Age of Onset  . Heart attack Father   . Coronary artery disease Sister 53    MI  . Breast cancer Maternal Aunt     Social History Social History  Substance Use Topics  . Smoking status: Never Smoker  . Smokeless tobacco: Never Used  . Alcohol use No    Review of Systems Patient denies headaches, rhinorrhea, blurry vision, numbness, shortness of breath, chest pain, edema, cough, abdominal pain, nausea, vomiting, diarrhea, dysuria, fevers, rashes or hallucinations unless otherwise stated above in HPI. ____________________________________________   PHYSICAL EXAM:  VITAL SIGNS: Vitals:   01/15/16 1331  BP: (!) 199/61  Pulse: 75  Resp: 18  Temp: 98.2 F (36.8 C)    Constitutional: Alert and oriented. Well appearing and in no acute distress. Eyes: Conjunctivae are normal. PERRL. EOMI. Head: Multiple lacerations and ecchymosis along the left side her face with dependent ecchymosis to the left neck Nose: No congestion/rhinnorhea. Mouth/Throat: Uvula is midline. Trismus. No pain along the maxilla. No evidence of laceration on  the oral mucosa. Does have tender erythematous area of fluctuance underlying sutures to the left inferior jaw. There is associated purulent drainage She has no woody induration. No tenderness palpation under the mandible. No crepitus. Neck: No stridor. Painless ROM. No cervical spine tenderness to palpation Hematological/Lymphatic/Immunilogical: No cervical lymphadenopathy. Cardiovascular: Normal rate, regular rhythm. Grossly normal heart sounds.  Good peripheral circulation. Respiratory: Normal respiratory effort.  No retractions. Lungs CTAB. Gastrointestinal: Soft and nontender. No distention. No abdominal bruits. No CVA tenderness. Genitourinary:  Musculoskeletal: No lower extremity tenderness nor edema.  No joint effusions. Neurologic:  Normal speech and language. No gross focal neurologic deficits are appreciated. No gait instability. Skin:  Patient has extensive ecchymosis as described above Psychiatric: Mood and affect are normal. Speech and behavior are normal.  ____________________________________________   LABS (all labs ordered are listed, but only abnormal results are displayed)  No results found for this or any previous visit (from the past 24 hour(s)). ____________________________________________ ____________________________________________   PROCEDURES  Procedure(s) performed: none    Critical Care performed: no ____________________________________________   INITIAL IMPRESSION / ASSESSMENT AND PLAN / ED COURSE  Pertinent labs & imaging results that were available during my care of the patient were reviewed by me and considered in my medical decision making (see chart for details).  DDX: abscess, cellulitis, ecchymosis  [name] is a 82 y.o. who presents to the ED with evidence of cellulitis and abscess to the left jaw very previous laceration repair. Patient with stable vital signs therefore do not feel laboratory diagnostics indicated. Bedside ultrasound  shows area of fluid collection. No evidence to suggest Ludwig's or deep space infection. She has no trismus. Her oropharynx is otherwise clear moist. She has no evidence of trauma to her dentition. Presentation is most consistent with wound infection.  The sutures are removed after application of let for analgesia. The wound was then uncovered with sterile scissors and roughly 2 cc of seropurulent material was drained. The wound is probed and deloculated. The patient tolerated procedure well. The wound was left open for spontaneous drainage. She will will be started on doxycycline to treat the area of surrounding cellulitis. She has no evidence of any other acute infection or acute processes. Patient clearly still sore from her recent fall but in no acute distress. The patient was able to tolerate oral hydration and antibiotics. I discussed strict return parameters.  Clinical Course  ____________________________________________   FINAL CLINICAL IMPRESSION(S) / ED DIAGNOSES  Final diagnoses:  Facial cellulitis  Left facial swelling      NEW MEDICATIONS STARTED DURING THIS VISIT:  New Prescriptions   No medications on file     Note:  This document was prepared using Dragon voice recognition software and may include unintentional dictation errors.    Merlyn Lot, MD 01/15/16 [phone]

## 2016-01-17 ENCOUNTER — Emergency Department
Admission: EM | Admit: 2016-01-17 | Discharge: 2016-01-17 | Disposition: A | Discharge status: Home / Self Care | Payer: PPO | Attending: Emergency Medicine | Admitting: Emergency Medicine

## 2016-01-17 ENCOUNTER — Encounter: Payer: Self-pay | Admitting: Emergency Medicine

## 2016-01-17 DIAGNOSIS — M545 Low back pain: Secondary | ICD-10-CM | POA: Diagnosis not present

## 2016-01-17 DIAGNOSIS — J449 Chronic obstructive pulmonary disease, unspecified: Secondary | ICD-10-CM | POA: Insufficient documentation

## 2016-01-17 DIAGNOSIS — Z96651 Presence of right artificial knee joint: Secondary | ICD-10-CM | POA: Diagnosis not present

## 2016-01-17 DIAGNOSIS — K122 Cellulitis and abscess of mouth: Secondary | ICD-10-CM | POA: Insufficient documentation

## 2016-01-17 DIAGNOSIS — Z79899 Other long term (current) drug therapy: Secondary | ICD-10-CM | POA: Diagnosis not present

## 2016-01-17 DIAGNOSIS — Z791 Long term (current) use of non-steroidal anti-inflammatories (NSAID): Secondary | ICD-10-CM | POA: Diagnosis not present

## 2016-01-17 DIAGNOSIS — Z7982 Long term (current) use of aspirin: Secondary | ICD-10-CM | POA: Insufficient documentation

## 2016-01-17 DIAGNOSIS — Z853 Personal history of malignant neoplasm of breast: Secondary | ICD-10-CM | POA: Insufficient documentation

## 2016-01-17 DIAGNOSIS — K52831 Collagenous colitis: Secondary | ICD-10-CM | POA: Diagnosis not present

## 2016-01-17 DIAGNOSIS — E039 Hypothyroidism, unspecified: Secondary | ICD-10-CM | POA: Insufficient documentation

## 2016-01-17 DIAGNOSIS — I5032 Chronic diastolic (congestive) heart failure: Secondary | ICD-10-CM | POA: Diagnosis not present

## 2016-01-17 DIAGNOSIS — I739 Peripheral vascular disease, unspecified: Secondary | ICD-10-CM | POA: Diagnosis not present

## 2016-01-17 DIAGNOSIS — Z96641 Presence of right artificial hip joint: Secondary | ICD-10-CM | POA: Diagnosis not present

## 2016-01-17 DIAGNOSIS — I951 Orthostatic hypotension: Secondary | ICD-10-CM | POA: Diagnosis not present

## 2016-01-17 DIAGNOSIS — E785 Hyperlipidemia, unspecified: Secondary | ICD-10-CM | POA: Diagnosis not present

## 2016-01-17 DIAGNOSIS — W19XXXD Unspecified fall, subsequent encounter: Secondary | ICD-10-CM | POA: Insufficient documentation

## 2016-01-17 DIAGNOSIS — Z9181 History of falling: Secondary | ICD-10-CM | POA: Diagnosis not present

## 2016-01-17 DIAGNOSIS — I480 Paroxysmal atrial fibrillation: Secondary | ICD-10-CM | POA: Diagnosis not present

## 2016-01-17 DIAGNOSIS — K219 Gastro-esophageal reflux disease without esophagitis: Secondary | ICD-10-CM | POA: Diagnosis not present

## 2016-01-17 DIAGNOSIS — I110 Hypertensive heart disease with heart failure: Secondary | ICD-10-CM | POA: Diagnosis not present

## 2016-01-17 DIAGNOSIS — S0181XD Laceration without foreign body of other part of head, subsequent encounter: Secondary | ICD-10-CM | POA: Diagnosis not present

## 2016-01-17 DIAGNOSIS — S01511D Laceration without foreign body of lip, subsequent encounter: Secondary | ICD-10-CM | POA: Diagnosis not present

## 2016-01-17 LAB — CBC WITH DIFFERENTIAL/PLATELET
Basophils Absolute: 0.1 10*3/uL (ref 0–0.1)
Basophils Relative: 1 %
Eosinophils Absolute: 0.2 10*3/uL (ref 0–0.7)
Eosinophils Relative: 2 %
HEMATOCRIT: 37.7 % (ref 35.0–47.0)
HEMOGLOBIN: 12.6 g/dL (ref 12.0–16.0)
LYMPHS ABS: 1.2 10*3/uL (ref 1.0–3.6)
LYMPHS PCT: 11 %
MCH: 32.3 pg (ref 26.0–34.0)
MCHC: 33.3 g/dL (ref 32.0–36.0)
MCV: 97.2 fL (ref 80.0–100.0)
Monocytes Absolute: 1.2 10*3/uL — ABNORMAL HIGH (ref 0.2–0.9)
Monocytes Relative: 11 %
NEUTROS PCT: 75 %
Neutro Abs: 8 10*3/uL — ABNORMAL HIGH (ref 1.4–6.5)
Platelets: 242 10*3/uL (ref 150–440)
RBC: 3.88 MIL/uL (ref 3.80–5.20)
RDW: 13.4 % (ref 11.5–14.5)
WBC: 10.7 10*3/uL (ref 3.6–11.0)

## 2016-01-17 MED ORDER — CLINDAMYCIN PHOSPHATE 900 MG/50ML IV SOLN
INTRAVENOUS | Status: AC
  Administered 2016-01-17: 900 mg via INTRAVENOUS
  Filled 2016-01-17: qty 50

## 2016-01-17 MED ORDER — CLINDAMYCIN HCL 300 MG PO CAPS: 300.0000 mg | ORAL_CAPSULE | Freq: Three times a day (TID) | ORAL | 0 refills | Status: AC

## 2016-01-17 MED ORDER — ACETAMINOPHEN 500 MG PO TABS
ORAL_TABLET | ORAL | Status: AC
  Administered 2016-01-17: 1000 mg via ORAL
  Filled 2016-01-17: qty 2

## 2016-01-17 MED ORDER — ACETAMINOPHEN 500 MG PO TABS
1000.0000 mg | ORAL_TABLET | Freq: Once | ORAL | Status: AC
  Administered 2016-01-17: 1000 mg via ORAL

## 2016-01-17 MED ORDER — CLINDAMYCIN PHOSPHATE 900 MG/50ML IV SOLN
900.0000 mg | Freq: Once | INTRAVENOUS | Status: AC
  Administered 2016-01-17: 900 mg via INTRAVENOUS
  Filled 2016-01-17: qty 50

## 2016-01-17 NOTE — ED Triage Notes (Signed)
Seen 6 days post fall for sutures. Seen 2 days ago for infected wound and antibiotics started. Recheck because appears a little more reddened to her.

## 2016-01-17 NOTE — Discharge Instructions (Signed)
Please return if he developed more redness, fever, swelling or significant discharge or drainage from the wound site. Please drink plenty of fluids Continue Tylenol for pain Stop the doxycycline and start the clindamycin

## 2016-01-17 NOTE — ED Notes (Addendum)
Pt seen by Dr. Quentin Cornwall here on Saturday. She states that she is seeing increased redness and swelling around upper lip, under nose. Pt alert & oriented with NAD noted.

## 2016-01-17 NOTE — ED Notes (Signed)
Pt discharged home after verbalizing understanding of discharge instructions; nad noted. 

## 2016-01-17 NOTE — ED Provider Notes (Signed)
Time Seen: Approximately 1719  I have reviewed the triage notes  Chief Complaint: Wound Infection   History of Present Illness: [name] is a 82 y.o. female who is status post sutured wound care for facial and lip laceration which occurred approximately 6 days ago. She was seen here 2 days ago for some swelling around her suture site with some redness. She was placed on doxycycline so only had 1 dose of the medication last night and has a known history of multiple antibiotic allergies. She denies any itching or new swelling, trouble with speech or swallowing. She felt that the redness may be slightly worse and came here to the emergency department.   Past Medical History:  Diagnosis Date  . Arthritis    "back, hands" (09/08/2015)  . Atrial fibrillation ([place])    Echocardiogram 5/12: normal LV function, moderate LAE, mild RAE, mild AI and MR and mild-to-moderate TR.  Myoview 5/ 12 showed an ejection fraction of 73% and normal perfusion.   Patient underwent elective DCCV on 08/26/10  . Breast cancer ([place]) 2007, 2013   right breast- 2007 radiation  . Cancer of right breast ([place])   . Chronic lower back pain   . Complication of anesthesia    "took me about 1 week to know what was going on after one of my knee ORs"  . COPD (chronic obstructive pulmonary disease) ([place])   . DDD (degenerative disc disease), lumbar   . 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 Dysrhythmia   . GERD (gastroesophageal reflux disease)   . Hyperlipidemia   . Hypertension   . Hypothyroid   . PAD (peripheral artery disease) ([place])   . PVD (peripheral vascular disease) [facility])     Patient Active Problem List   Diagnosis Date Noted  . Orthostatic hypotension 01/14/2016  . ARF (acute renal failure) ([place]) 01/14/2016  . Dehydration 01/14/2016  . Diarrhea 01/14/2016  . Pressure injury of skin 01/12/2016  . Protein-calorie malnutrition, severe 01/12/2016  . Ductal carcinoma in situ (DCIS) of  right breast 09/01/2015  . PAD (peripheral artery disease) ([place]) 08/10/2014  . Claudication ([place]) 06/30/2014  . Chronic diastolic congestive heart failure ([place]) 02/26/2014  . Edema 07/28/2013  . Preop cardiovascular exam 07/28/2013  . Renal insufficiency 09/14/2011  . PAF (paroxysmal atrial fibrillation) ([place]) 08/09/2010  . Hypertension 08/09/2010  . Chest pain 08/09/2010    Past Surgical History:  Procedure Laterality Date  . ABDOMINAL EXPLORATION SURGERY     "had to go back in after hysterectomy & check on stitch dr had put near my bladder; don't know if they took it out; had to wear catheter for 1 month"  . ABDOMINAL HYSTERECTOMY    . APPENDECTOMY    . BACK SURGERY    . BALLOON ANGIOPLASTY, ARTERY Right 09/08/2015   superficial femoral  . BREAST EXCISIONAL BIOPSY Right 2007   positive  . BREAST SURGERY    . CARDIOVERSION  11/10/2011   Procedure: CARDIOVERSION;  Surgeon: Lelon Perla, MD;  Location: [place];  Service: Cardiovascular;  Laterality: N/A;  . CARDIOVERSION  08/26/2010  . COLONOSCOPY WITH PROPOFOL N/A 01/04/2016   Procedure: COLONOSCOPY WITH PROPOFOL;  Surgeon: Lollie Sails, MD;  Location: [facility] ENDOSCOPY;  Service: Endoscopy;  Laterality: N/A;  . HALLUX VALGUS CORRECTION    . JOINT REPLACEMENT    . LUMBAR DISC SURGERY    . MASTECTOMY Right 2013   positive  . PERIPHERAL VASCULAR CATHETERIZATION N/A 09/08/2015  Procedure: Abdominal Aortogram w/Lower Extremity;  Surgeon: Wellington Hampshire, MD;  Location: [place] CV LAB;  Service: Cardiovascular;  Laterality: N/A;  . PERIPHERAL VASCULAR CATHETERIZATION  09/08/2015   Procedure: Peripheral Vascular Balloon Angioplasty;  Surgeon: Wellington Hampshire, MD;  Location: [place] CV LAB;  Service: Cardiovascular;;  . REVISION TOTAL KNEE ARTHROPLASTY Right   . rt.ankle ulcer surgery  2013  . TEMPOROMANDIBULAR JOINT ARTHROPLASTY    . TMJ ARTHROPLASTY    . TONSILLECTOMY    . TOTAL KNEE ARTHROPLASTY Right     Past  Surgical History:  Procedure Laterality Date  . ABDOMINAL EXPLORATION SURGERY     "had to go back in after hysterectomy & check on stitch dr had put near my bladder; don't know if they took it out; had to wear catheter for 1 month"  . ABDOMINAL HYSTERECTOMY    . APPENDECTOMY    . BACK SURGERY    . BALLOON ANGIOPLASTY, ARTERY Right 09/08/2015   superficial femoral  . BREAST EXCISIONAL BIOPSY Right 2007   positive  . BREAST SURGERY    . CARDIOVERSION  11/10/2011   Procedure: CARDIOVERSION;  Surgeon: Lelon Perla, MD;  Location: [place];  Service: Cardiovascular;  Laterality: N/A;  . CARDIOVERSION  08/26/2010  . COLONOSCOPY WITH PROPOFOL N/A 01/04/2016   Procedure: COLONOSCOPY WITH PROPOFOL;  Surgeon: Lollie Sails, MD;  Location: [facility] ENDOSCOPY;  Service: Endoscopy;  Laterality: N/A;  . HALLUX VALGUS CORRECTION    . JOINT REPLACEMENT    . LUMBAR DISC SURGERY    . MASTECTOMY Right 2013   positive  . PERIPHERAL VASCULAR CATHETERIZATION N/A 09/08/2015   Procedure: Abdominal Aortogram w/Lower Extremity;  Surgeon: Wellington Hampshire, MD;  Location: [place] CV LAB;  Service: Cardiovascular;  Laterality: N/A;  . PERIPHERAL VASCULAR CATHETERIZATION  09/08/2015   Procedure: Peripheral Vascular Balloon Angioplasty;  Surgeon: Wellington Hampshire, MD;  Location: [place] CV LAB;  Service: Cardiovascular;;  . REVISION TOTAL KNEE ARTHROPLASTY Right   . rt.ankle ulcer surgery  2013  . TEMPOROMANDIBULAR JOINT ARTHROPLASTY    . TMJ ARTHROPLASTY    . TONSILLECTOMY    . TOTAL KNEE ARTHROPLASTY Right     Current Outpatient Rx  . Order #: [id number] Class: Historical Med  . Order #: [id number] Class: Normal  . Order #: [id number] Class: Historical Med  . Order #: [id number] Class: OTC  . Order #: [id number] Class: Normal  . Order #: [id number] Class: Historical Med  . Order #: [id number] Class: Historical Med  . Order #: [id number] Class: Print  . Order #: [id number] Class: Historical Med  . Order #:  [id number] Class: Normal  . Order #: [id number] Class: Normal  . Order #: [id number] Class: Historical Med  . Order #: [id number] Class: Historical Med  . Order #: [id number] Class: Historical Med  . Order #: [id number] Class: Normal  . Order #: [id number] Class: Historical Med  . Order #: [id number] Class: Print  . Order #: [id number] Class: No Print  . Order #: [id number] Class: Normal  . Order #: [id number] Class: Historical Med  . Order #: [id number] Class: Historical Med  . Order #: [id number] Class: Historical Med    Allergies:  Amlodipine; Ciprofloxacin; Doxycycline; Lipitor [atorvastatin]; Morphine and related; Macrobid [nitrofurantoin macrocrystal]; Penicillins; and Sulfamethoxazole-trimethoprim  Family History: Family History  Problem Relation Age of Onset  . Heart attack Father   . Coronary artery disease Sister 71    MI  . Breast cancer Maternal Aunt     Social History: Social History  Substance Use Topics  . Smoking status: Never Smoker  .  Smokeless tobacco: Never Used  . Alcohol use No     Review of Systems:   10 point review of systems was performed and was otherwise negative:  Constitutional: No fever Eyes: No visual disturbances ENT: No sore throat, ear pain Cardiac: No chest pain Respiratory: No shortness of breath, wheezing, or stridor Abdomen: No abdominal pain, no vomiting, No diarrhea Endocrine: No weight loss, No night sweats Extremities: No peripheral edema, cyanosis Skin: No rashes, easy bruising Neurologic: No focal weakness, trouble with speech or swollowing Urologic: No dysuria, Hematuria, or urinary frequency   Physical Exam:  ED Triage Vitals  Enc Vitals Group     BP 01/17/16 1615 (!) 142/56     Pulse Rate 01/17/16 1615 99     Resp 01/17/16 1615 18     Temp 01/17/16 1615 98.6 F (37 C)     Temp Source 01/17/16 1615 Oral     SpO2 01/17/16 1615 98 %     Weight 01/17/16 1616 130 lb (59 kg)     Height 01/17/16 1616 5\' 8"  (1.727 m)     Head Circumference  --      Peak Flow --      Pain Score 01/17/16 1616 9     Pain Loc --      Pain Edu? --      Excl. in [place]? --     General: Awake , Alert , and Oriented times 3; GCS 15 Head: Normal cephalic , atraumatic Examination of the face shows some redness and swelling surrounding the stitches in her upper lip. There apparently is some deeper stitch is not visible. There is no active discharge or drainage. Otherwise, the wound appears well aligned and healing. No active discharge and no swelling on the inside part of her lip and no indications of migration of the erythema beyond the edges of the wound. Eyes: Pupils equal , round, reactive to light Nose/Throat: No nasal drainage, patent upper airway without erythema or exudate.  Neck: Supple, Full range of motion, No anterior adenopathy or palpable thyroid masses Lungs: Clear to ascultation without wheezes , rhonchi, or rales Heart: Regular rate, regular rhythm without murmurs , gallops , or rubs Abdomen: Soft, non tender without rebound, guarding , or rigidity; bowel sounds positive and symmetric in all 4 quadrants. No organomegaly .        Extremities: 2 plus symmetric pulses. No edema, clubbing or cyanosis Neurologic: normal ambulation, Motor symmetric without deficits, sensory intact Skin: warm, dry, no rashes   Labs:   All laboratory work was reviewed including any pertinent negatives or positives listed below:  Labs Reviewed  CBC WITH DIFFERENTIAL/PLATELET - Abnormal; Notable for the following:       Result Value   Neutro Abs 8.0 (*)    Monocytes Absolute 1.2 (*)    All other components within normal limits     ED Course: * The patient I felt looking at her stitches there is likely best to take out her skins stitches at this time. I felt it did not need to open up the rest of her when. He should continue the oral antibiotic therapy. Review of her allergy shows that she's had a past history of an allergy to doxycycline. I felt we would try  clindamycin since she is allergic to most other antibiotics. She was given IV clindamycin here in emergency department we discharged on the same and seemed to tolerate the IV version of the medication. Point with her primary physician  and she was advised to keep that appointment and return here if she has any further wound concerns. I felt the infection and not migrated deep into the mucosal or sinus region. Clinical Course     Assessment: * Cellulitis Status post facial laceration with closure   Final Clinical Impression:   Final diagnoses:  Cellulitis of mouth     Plan: * Outpatient " New Prescriptions   CLINDAMYCIN (CLEOCIN) 300 MG CAPSULE    Take 1 capsule (300 mg total) by mouth 3 (three) times daily.  " Patient was advised to return immediately if condition worsens. Patient was advised to follow up with their primary care physician or other specialized physicians involved in their outpatient care. The patient and/or family member/power of attorney had laboratory results reviewed at the bedside. All questions and concerns were addressed and appropriate discharge instructions were distributed by the nursing staff.             Daymon Larsen, MD 01/17/16 [phone]

## 2016-01-18 LAB — AEROBIC CULTURE  (SUPERFICIAL SPECIMEN)

## 2016-01-18 LAB — AEROBIC CULTURE W GRAM STAIN (SUPERFICIAL SPECIMEN): Special Requests: NORMAL

## 2016-01-19 DIAGNOSIS — L0201 Cutaneous abscess of face: Secondary | ICD-10-CM | POA: Diagnosis not present

## 2016-01-19 DIAGNOSIS — S0181XD Laceration without foreign body of other part of head, subsequent encounter: Secondary | ICD-10-CM | POA: Diagnosis not present

## 2016-01-25 DIAGNOSIS — I5043 Acute on chronic combined systolic (congestive) and diastolic (congestive) heart failure: Secondary | ICD-10-CM | POA: Diagnosis not present

## 2016-01-25 DIAGNOSIS — E860 Dehydration: Secondary | ICD-10-CM | POA: Diagnosis not present

## 2016-01-25 DIAGNOSIS — N170 Acute kidney failure with tubular necrosis: Secondary | ICD-10-CM | POA: Diagnosis not present

## 2016-01-25 DIAGNOSIS — I481 Persistent atrial fibrillation: Secondary | ICD-10-CM | POA: Diagnosis not present

## 2016-01-28 DIAGNOSIS — I5032 Chronic diastolic (congestive) heart failure: Secondary | ICD-10-CM | POA: Diagnosis not present

## 2016-01-28 DIAGNOSIS — Z853 Personal history of malignant neoplasm of breast: Secondary | ICD-10-CM | POA: Diagnosis not present

## 2016-01-28 DIAGNOSIS — M545 Low back pain: Secondary | ICD-10-CM | POA: Diagnosis not present

## 2016-01-28 DIAGNOSIS — I110 Hypertensive heart disease with heart failure: Secondary | ICD-10-CM | POA: Diagnosis not present

## 2016-01-28 DIAGNOSIS — Z96651 Presence of right artificial knee joint: Secondary | ICD-10-CM | POA: Diagnosis not present

## 2016-01-28 DIAGNOSIS — I480 Paroxysmal atrial fibrillation: Secondary | ICD-10-CM | POA: Diagnosis not present

## 2016-01-28 DIAGNOSIS — S0181XD Laceration without foreign body of other part of head, subsequent encounter: Secondary | ICD-10-CM | POA: Diagnosis not present

## 2016-01-28 DIAGNOSIS — K219 Gastro-esophageal reflux disease without esophagitis: Secondary | ICD-10-CM | POA: Diagnosis not present

## 2016-01-28 DIAGNOSIS — Z96641 Presence of right artificial hip joint: Secondary | ICD-10-CM | POA: Diagnosis not present

## 2016-01-28 DIAGNOSIS — E785 Hyperlipidemia, unspecified: Secondary | ICD-10-CM | POA: Diagnosis not present

## 2016-01-28 DIAGNOSIS — K52831 Collagenous colitis: Secondary | ICD-10-CM | POA: Diagnosis not present

## 2016-01-28 DIAGNOSIS — Z9181 History of falling: Secondary | ICD-10-CM | POA: Diagnosis not present

## 2016-01-28 DIAGNOSIS — I951 Orthostatic hypotension: Secondary | ICD-10-CM | POA: Diagnosis not present

## 2016-01-28 DIAGNOSIS — J449 Chronic obstructive pulmonary disease, unspecified: Secondary | ICD-10-CM | POA: Diagnosis not present

## 2016-01-28 DIAGNOSIS — Z7982 Long term (current) use of aspirin: Secondary | ICD-10-CM | POA: Diagnosis not present

## 2016-01-28 DIAGNOSIS — I739 Peripheral vascular disease, unspecified: Secondary | ICD-10-CM | POA: Diagnosis not present

## 2016-02-01 DIAGNOSIS — Z9181 History of falling: Secondary | ICD-10-CM | POA: Diagnosis not present

## 2016-02-01 DIAGNOSIS — I739 Peripheral vascular disease, unspecified: Secondary | ICD-10-CM | POA: Diagnosis not present

## 2016-02-01 DIAGNOSIS — K52831 Collagenous colitis: Secondary | ICD-10-CM | POA: Diagnosis not present

## 2016-02-01 DIAGNOSIS — Z853 Personal history of malignant neoplasm of breast: Secondary | ICD-10-CM | POA: Diagnosis not present

## 2016-02-01 DIAGNOSIS — I951 Orthostatic hypotension: Secondary | ICD-10-CM | POA: Diagnosis not present

## 2016-02-01 DIAGNOSIS — Z96651 Presence of right artificial knee joint: Secondary | ICD-10-CM | POA: Diagnosis not present

## 2016-02-01 DIAGNOSIS — Z7982 Long term (current) use of aspirin: Secondary | ICD-10-CM | POA: Diagnosis not present

## 2016-02-01 DIAGNOSIS — I480 Paroxysmal atrial fibrillation: Secondary | ICD-10-CM | POA: Diagnosis not present

## 2016-02-01 DIAGNOSIS — I110 Hypertensive heart disease with heart failure: Secondary | ICD-10-CM | POA: Diagnosis not present

## 2016-02-01 DIAGNOSIS — Z96641 Presence of right artificial hip joint: Secondary | ICD-10-CM | POA: Diagnosis not present

## 2016-02-01 DIAGNOSIS — I5032 Chronic diastolic (congestive) heart failure: Secondary | ICD-10-CM | POA: Diagnosis not present

## 2016-02-01 DIAGNOSIS — M545 Low back pain: Secondary | ICD-10-CM | POA: Diagnosis not present

## 2016-02-01 DIAGNOSIS — E785 Hyperlipidemia, unspecified: Secondary | ICD-10-CM | POA: Diagnosis not present

## 2016-02-01 DIAGNOSIS — S0181XD Laceration without foreign body of other part of head, subsequent encounter: Secondary | ICD-10-CM | POA: Diagnosis not present

## 2016-02-01 DIAGNOSIS — K219 Gastro-esophageal reflux disease without esophagitis: Secondary | ICD-10-CM | POA: Diagnosis not present

## 2016-02-01 DIAGNOSIS — J449 Chronic obstructive pulmonary disease, unspecified: Secondary | ICD-10-CM | POA: Diagnosis not present

## 2016-02-07 DIAGNOSIS — I5043 Acute on chronic combined systolic (congestive) and diastolic (congestive) heart failure: Secondary | ICD-10-CM | POA: Diagnosis not present

## 2016-02-07 DIAGNOSIS — Z23 Encounter for immunization: Secondary | ICD-10-CM | POA: Diagnosis not present

## 2016-02-07 DIAGNOSIS — I481 Persistent atrial fibrillation: Secondary | ICD-10-CM | POA: Diagnosis not present

## 2016-02-07 DIAGNOSIS — N3090 Cystitis, unspecified without hematuria: Secondary | ICD-10-CM | POA: Diagnosis not present

## 2016-02-21 DIAGNOSIS — R197 Diarrhea, unspecified: Secondary | ICD-10-CM | POA: Diagnosis not present

## 2016-02-21 DIAGNOSIS — K52831 Collagenous colitis: Secondary | ICD-10-CM | POA: Diagnosis not present

## 2016-03-30 DIAGNOSIS — R300 Dysuria: Secondary | ICD-10-CM | POA: Diagnosis not present

## 2016-04-25 DIAGNOSIS — I739 Peripheral vascular disease, unspecified: Secondary | ICD-10-CM | POA: Diagnosis not present

## 2016-05-02 DIAGNOSIS — I739 Peripheral vascular disease, unspecified: Secondary | ICD-10-CM | POA: Diagnosis not present

## 2016-05-02 DIAGNOSIS — Z0000 Encounter for general adult medical examination without abnormal findings: Secondary | ICD-10-CM | POA: Diagnosis not present

## 2016-05-02 DIAGNOSIS — I5043 Acute on chronic combined systolic (congestive) and diastolic (congestive) heart failure: Secondary | ICD-10-CM | POA: Diagnosis not present

## 2016-05-02 DIAGNOSIS — I482 Chronic atrial fibrillation: Secondary | ICD-10-CM | POA: Diagnosis not present

## 2016-05-09 ENCOUNTER — Other Ambulatory Visit: Payer: Self-pay | Admitting: Cardiovascular Disease

## 2016-05-09 DIAGNOSIS — I739 Peripheral vascular disease, unspecified: Secondary | ICD-10-CM

## 2016-05-17 ENCOUNTER — Ambulatory Visit (HOSPITAL_COMMUNITY)
Admission: RE | Admit: 2016-05-17 | Discharge: 2016-05-17 | Disposition: A | Discharge status: Home / Self Care | Payer: PPO | Source: Ambulatory Visit | Attending: Cardiovascular Disease | Admitting: Cardiovascular Disease

## 2016-05-17 DIAGNOSIS — I739 Peripheral vascular disease, unspecified: Secondary | ICD-10-CM | POA: Diagnosis not present

## 2016-06-27 ENCOUNTER — Ambulatory Visit: Payer: PPO | Admitting: Cardiovascular Disease

## 2016-07-03 ENCOUNTER — Encounter: Payer: Self-pay | Admitting: Cardiovascular Disease

## 2016-07-03 ENCOUNTER — Ambulatory Visit (INDEPENDENT_AMBULATORY_CARE_PROVIDER_SITE_OTHER): Payer: PPO | Admitting: Cardiovascular Disease

## 2016-07-03 VITALS — BP 126/59 | HR 73 | Ht 68.0 in | Wt 126.5 lb

## 2016-07-03 DIAGNOSIS — I5032 Chronic diastolic (congestive) heart failure: Secondary | ICD-10-CM | POA: Diagnosis not present

## 2016-07-03 DIAGNOSIS — I10 Essential (primary) hypertension: Secondary | ICD-10-CM | POA: Diagnosis not present

## 2016-07-03 DIAGNOSIS — I739 Peripheral vascular disease, unspecified: Secondary | ICD-10-CM

## 2016-07-03 DIAGNOSIS — R1313 Dysphagia, pharyngeal phase: Secondary | ICD-10-CM | POA: Diagnosis not present

## 2016-07-03 DIAGNOSIS — E785 Hyperlipidemia, unspecified: Secondary | ICD-10-CM | POA: Diagnosis not present

## 2016-07-03 NOTE — Patient Instructions (Signed)
Medication Instructions:  Your physician has recommended you make the following change in your medication:  Please make sure you are not taking Plavix. If you are, please discontinue  Labwork: none  Testing/Procedures: none  Follow-Up: Your physician wants you to follow-up in: 6 months with Dr. Fletcher Anon.  You will receive a reminder letter in the mail two months in advance. If you don't receive a letter, please call our office to schedule the follow-up appointment.   Any Other Special Instructions Will Be Listed Below (If Applicable).     If you need a refill on your cardiac medications before your next appointment, please call your pharmacy.

## 2016-07-03 NOTE — Progress Notes (Signed)
Cardiology Office Note   Date:  07/04/2016   ID:  [name], DOB 10-Apr-1933, MRN [id number]  PCP:  Rusty Aus, MD  Cardiologist:   Kathlyn Sacramento, MD   Chief Complaint  Patient presents with  . other    6 month follow up. Patient states she is doing well. Meds reviewed verbally with patient.       History of Present Illness: [name] is a 83 y.o. female who presents for peripheral arterial disease and atrial fibrillation.  She had previous previous cardioversion in 2012. She has been maintaining in sinus rhythm with amiodarone. She has no history of coronary artery disease with previous nuclear stress test in 2012. Anticoagulation with warfarin was stopped due to frequent falls. She has chronic kidney disease with chronic leg edema.  She is known to have peripheral arterial disease. She underwent angiography in May 2017 due to nonhealing ulcer on the right foot. Angiography showed significant proximal right popliteal artery stenosis with short occlusion of the right anterior tibial artery and significant disease affecting the TP trunk with occlusion of the left posterior tibial artery. I performed successful angioplasty of the anterior tibial artery as well as drug-coated balloon angioplasty of the right popliteal artery.  Most recent vascular studies in January of this year showed normal ABI bilaterally with no significant obstructive disease on duplex. She is doing reasonably well overall with no chest pain or shortness of breath. She has chronic leg pain which does not seem to be due to claudication.    Past Medical History:  Diagnosis Date  . Arthritis    "back, hands" (09/08/2015)  . Atrial fibrillation ([place])    Echocardiogram 5/12: normal LV function, moderate LAE, mild RAE, mild AI and MR and mild-to-moderate TR.  Myoview 5/ 12 showed an ejection fraction of 73% and normal perfusion.   Patient underwent elective DCCV on 08/26/10  . Breast cancer ([place]) 2007, 2013   right breast- 2007 radiation  . Cancer of right breast ([place])   . Chronic lower back pain   . Complication of anesthesia    "took me about 1 week to know what was going on after one of my knee ORs"  . COPD (chronic obstructive pulmonary disease) ([place])   . DDD (degenerative disc disease), lumbar   . 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 Dysrhythmia   . GERD (gastroesophageal reflux disease)   . Hyperlipidemia   . Hypertension   . Hypothyroid   . PAD (peripheral artery disease) ([place])   . PVD (peripheral vascular disease) ([place])     Past Surgical History:  Procedure Laterality Date  . ABDOMINAL EXPLORATION SURGERY     "had to go back in after hysterectomy & check on stitch dr had put near my bladder; don't know if they took it out; had to wear catheter for 1 month"  . ABDOMINAL HYSTERECTOMY    . APPENDECTOMY    . BACK SURGERY    . BALLOON ANGIOPLASTY, ARTERY Right 09/08/2015   superficial femoral  . BREAST EXCISIONAL BIOPSY Right 2007   positive  . BREAST SURGERY    . CARDIOVERSION  11/10/2011   Procedure: CARDIOVERSION;  Surgeon: Lelon Perla, MD;  Location: [facility];  Service: Cardiovascular;  Laterality: N/A;  . CARDIOVERSION  08/26/2010  . COLONOSCOPY WITH PROPOFOL N/A 01/04/2016   Procedure: COLONOSCOPY WITH PROPOFOL;  Surgeon: Lollie Sails, MD;  Location: [facility] ENDOSCOPY;  Service: Endoscopy;  Laterality:  N/A;  . HALLUX VALGUS CORRECTION    . JOINT REPLACEMENT    . LUMBAR DISC SURGERY    . MASTECTOMY Right 2013   positive  . PERIPHERAL VASCULAR CATHETERIZATION N/A 09/08/2015   Procedure: Abdominal Aortogram w/Lower Extremity;  Surgeon: Wellington Hampshire, MD;  Location: [place] CV LAB;  Service: Cardiovascular;  Laterality: N/A;  . PERIPHERAL VASCULAR CATHETERIZATION  09/08/2015   Procedure: Peripheral Vascular Balloon Angioplasty;  Surgeon: Wellington Hampshire, MD;  Location: [place] CV LAB;  Service: Cardiovascular;;  . REVISION TOTAL KNEE  ARTHROPLASTY Right   . rt.ankle ulcer surgery  2013  . TEMPOROMANDIBULAR JOINT ARTHROPLASTY    . TMJ ARTHROPLASTY    . TONSILLECTOMY    . TOTAL KNEE ARTHROPLASTY Right      Current Outpatient Prescriptions  Medication Sig Dispense Refill  . acetaminophen (TYLENOL) 500 MG tablet Take 500 mg by mouth every 6 (six) hours as needed (For pain.).    Marland Kitchen amiodarone (PACERONE) 200 MG tablet Take 1 tablet (200 mg total) by mouth daily. 90 tablet 3  . ascorbic acid (VITAMIN C) 500 MG tablet Take 500 mg by mouth daily.    Marland Kitchen aspirin EC 81 MG tablet Take 1 tablet (81 mg total) by mouth daily. 90 tablet 3  . budesonide (ENTOCORT EC) 3 MG 24 hr capsule Take 3 capsules (9 mg total) by mouth daily. 30 capsule 0  . calcium-vitamin D (OSCAL WITH D) 500-200 MG-UNIT per tablet Take 1 tablet by mouth daily with breakfast.    . cholecalciferol (VITAMIN D) 1000 units tablet Take 1,000 Units by mouth daily.    Marland Kitchen docusate sodium (COLACE) 100 MG capsule Take 100 mg by mouth daily as needed for mild constipation.    . feeding supplement, ENSURE ENLIVE, (ENSURE ENLIVE) LIQD Take 237 mLs by mouth 3 (three) times daily between meals. 237 mL 12  . hydrALAZINE (APRESOLINE) 25 MG tablet Take 1 tablet (25 mg total) by mouth every 8 (eight) hours. 30 tablet 0  . Iron-Vitamin C 65-125 MG TABS Take 1 tablet by mouth daily.    Marland Kitchen levothyroxine (SYNTHROID, LEVOTHROID) 150 MCG tablet Take 150 mcg by mouth daily before breakfast.    . lisinopril (PRINIVIL,ZESTRIL) 10 MG tablet Take 10 mg by mouth daily.    . metaxalone (SKELAXIN) 800 MG tablet Take 1 tablet (800 mg total) by mouth 3 (three) times daily. 30 tablet 0  . Multiple Vitamin (MULTIVITAMIN) tablet Take 1 tablet by mouth daily.      Marland Kitchen oxyCODONE-acetaminophen (PERCOCET/ROXICET) 5-325 MG tablet Take 1 tablet by mouth every 6 (six) hours as needed for moderate pain or severe pain. 30 tablet 0  . pantoprazole (PROTONIX) 40 MG tablet Take 1 tablet (40 mg total) by mouth daily. 30  tablet 0  . spironolactone (ALDACTONE) 25 MG tablet Take 25 mg by mouth daily.    . temazepam (RESTORIL) 15 MG capsule Take 15 mg by mouth at bedtime as needed for sleep.   5  . venlafaxine (EFFEXOR-XR) 75 MG 24 hr capsule Take 75 mg by mouth daily.      . rosuvastatin (CRESTOR) 10 MG tablet Take 1 tablet (10 mg total) by mouth daily. 30 tablet 6   No current facility-administered medications for this visit.     Allergies:   Amlodipine; Ciprofloxacin; Doxycycline; Lipitor [atorvastatin]; Morphine and related; Macrobid [nitrofurantoin macrocrystal]; Penicillins; and Sulfamethoxazole-trimethoprim    Social History:  The patient  reports that she has never smoked. She has never  used smokeless tobacco. She reports that she does not drink alcohol or use drugs.   Family History:  The patient's family history includes Breast cancer in her maternal aunt; Coronary artery disease (age of onset: 42) in her sister; Heart attack in her father.    ROS:  Please see the history of present illness.   Otherwise, review of systems are positive for none.   All other systems are reviewed and negative.    PHYSICAL EXAM: VS:  BP (!) 126/59 (BP Location: Left Arm, Patient Position: Sitting, Cuff Size: Normal)   Pulse 73   Ht 5\' 8"  (1.727 m)   Wt 126 lb 8 oz (57.4 kg)   BMI 19.23 kg/m  , BMI Body mass index is 19.23 kg/m. GEN: Well nourished, well developed, in no acute distress  HEENT: normal  Neck: no JVD, carotid bruits, or masses Cardiac: RRR; no murmurs, rubs, or gallops,no edema  Respiratory:  clear to auscultation bilaterally, normal work of breathing GI: soft, nontender, nondistended, + BS MS: no deformity or atrophy  Skin: warm and dry, no rash Neuro:  Strength and sensation are intact Psych: euthymic mood, full affect Femoral pulses are normal.   EKG:  EKG is  ordered today.  EKG showed normal sinus rhythm with mildly prolonged QT interval.  Recent Labs: 09/01/2015: ALT 45 01/12/2016: TSH  0.918 01/14/2016: BUN 17; Creatinine, Ser 1.01; Potassium 3.9; Sodium 144 01/17/2016: Hemoglobin 12.6; Platelets 242    Lipid Panel No results found for: CHOL, TRIG, HDL, CHOLHDL, VLDL, LDLCALC, LDLDIRECT    Wt Readings from Last 3 Encounters:  07/03/16 126 lb 8 oz (57.4 kg)  01/17/16 130 lb (59 kg)  01/15/16 130 lb (59 kg)        ASSESSMENT AND PLAN:  1.  Peripheral arterial disease: There is no open ulcer on the right lateral ankle anymore.  Most recent vascular studies in January showed significant improvement in ABI to normal. I discontinued Plavix today. Continue aspirin indefinitely.  2. Persistent atrial fibrillation: Currently maintaining in sinus rhythm with amiodarone.   3. Chronic diastolic heart failure: She appears to be euvolemic.  4. Essential hypertension: Blood pressure is controlled on current medications.  5. Hyperlipidemia: Continue treatment with rosuvastatin. Most recent lipid profile in January showed an LDL of 115. We should consider increasing the dose of rosuvastatin.  Disposition:   FU with me in 6 month  Signed,  Kathlyn Sacramento, MD  07/04/2016 5:34 PM    [place]

## 2016-07-06 ENCOUNTER — Other Ambulatory Visit: Payer: Self-pay | Admitting: Otolaryngology

## 2016-07-06 DIAGNOSIS — R1319 Other dysphagia: Secondary | ICD-10-CM

## 2016-07-17 ENCOUNTER — Ambulatory Visit: Admission: RE | Admit: 2016-07-17 | Payer: PPO | Source: Ambulatory Visit

## 2016-07-24 DIAGNOSIS — I5043 Acute on chronic combined systolic (congestive) and diastolic (congestive) heart failure: Secondary | ICD-10-CM | POA: Diagnosis not present

## 2016-07-24 DIAGNOSIS — M542 Cervicalgia: Secondary | ICD-10-CM | POA: Diagnosis not present

## 2016-08-15 ENCOUNTER — Other Ambulatory Visit: Payer: Self-pay | Admitting: Internal Medicine

## 2016-08-15 DIAGNOSIS — Z1231 Encounter for screening mammogram for malignant neoplasm of breast: Secondary | ICD-10-CM

## 2016-08-18 DIAGNOSIS — I739 Peripheral vascular disease, unspecified: Secondary | ICD-10-CM | POA: Diagnosis not present

## 2016-08-18 DIAGNOSIS — M2042 Other hammer toe(s) (acquired), left foot: Secondary | ICD-10-CM | POA: Diagnosis not present

## 2016-08-18 DIAGNOSIS — L851 Acquired keratosis [keratoderma] palmaris et plantaris: Secondary | ICD-10-CM | POA: Diagnosis not present

## 2016-08-18 DIAGNOSIS — B351 Tinea unguium: Secondary | ICD-10-CM | POA: Diagnosis not present

## 2016-08-29 DIAGNOSIS — I5043 Acute on chronic combined systolic (congestive) and diastolic (congestive) heart failure: Secondary | ICD-10-CM | POA: Diagnosis not present

## 2016-08-31 ENCOUNTER — Inpatient Hospital Stay: Payer: PPO

## 2016-08-31 ENCOUNTER — Inpatient Hospital Stay: Payer: PPO | Admitting: Oncology

## 2016-09-13 DIAGNOSIS — Z0000 Encounter for general adult medical examination without abnormal findings: Secondary | ICD-10-CM | POA: Diagnosis not present

## 2016-09-13 DIAGNOSIS — I482 Chronic atrial fibrillation: Secondary | ICD-10-CM | POA: Diagnosis not present

## 2016-09-13 DIAGNOSIS — I5043 Acute on chronic combined systolic (congestive) and diastolic (congestive) heart failure: Secondary | ICD-10-CM | POA: Diagnosis not present

## 2016-09-13 DIAGNOSIS — I739 Peripheral vascular disease, unspecified: Secondary | ICD-10-CM | POA: Diagnosis not present

## 2016-09-13 DIAGNOSIS — R42 Dizziness and giddiness: Secondary | ICD-10-CM | POA: Diagnosis not present

## 2016-09-15 ENCOUNTER — Inpatient Hospital Stay: Payer: PPO | Admitting: Oncology

## 2016-09-15 ENCOUNTER — Inpatient Hospital Stay: Payer: PPO

## 2016-09-15 DIAGNOSIS — H8111 Benign paroxysmal vertigo, right ear: Secondary | ICD-10-CM | POA: Diagnosis not present

## 2016-09-15 DIAGNOSIS — R1314 Dysphagia, pharyngoesophageal phase: Secondary | ICD-10-CM | POA: Diagnosis not present

## 2016-09-18 ENCOUNTER — Ambulatory Visit: Payer: PPO | Attending: Internal Medicine

## 2016-09-29 ENCOUNTER — Inpatient Hospital Stay (HOSPITAL_BASED_OUTPATIENT_CLINIC_OR_DEPARTMENT_OTHER): Payer: PPO | Admitting: Oncology

## 2016-09-29 ENCOUNTER — Inpatient Hospital Stay: Payer: PPO | Attending: Oncology

## 2016-09-29 DIAGNOSIS — K219 Gastro-esophageal reflux disease without esophagitis: Secondary | ICD-10-CM

## 2016-09-29 DIAGNOSIS — M129 Arthropathy, unspecified: Secondary | ICD-10-CM | POA: Insufficient documentation

## 2016-09-29 DIAGNOSIS — E039 Hypothyroidism, unspecified: Secondary | ICD-10-CM

## 2016-09-29 DIAGNOSIS — I739 Peripheral vascular disease, unspecified: Secondary | ICD-10-CM | POA: Diagnosis not present

## 2016-09-29 DIAGNOSIS — G8929 Other chronic pain: Secondary | ICD-10-CM | POA: Insufficient documentation

## 2016-09-29 DIAGNOSIS — Z79899 Other long term (current) drug therapy: Secondary | ICD-10-CM

## 2016-09-29 DIAGNOSIS — E785 Hyperlipidemia, unspecified: Secondary | ICD-10-CM | POA: Diagnosis not present

## 2016-09-29 DIAGNOSIS — Z7982 Long term (current) use of aspirin: Secondary | ICD-10-CM | POA: Insufficient documentation

## 2016-09-29 DIAGNOSIS — I10 Essential (primary) hypertension: Secondary | ICD-10-CM | POA: Insufficient documentation

## 2016-09-29 DIAGNOSIS — I4891 Unspecified atrial fibrillation: Secondary | ICD-10-CM | POA: Diagnosis not present

## 2016-09-29 DIAGNOSIS — Z9011 Acquired absence of right breast and nipple: Secondary | ICD-10-CM | POA: Diagnosis not present

## 2016-09-29 DIAGNOSIS — M545 Low back pain: Secondary | ICD-10-CM | POA: Diagnosis not present

## 2016-09-29 DIAGNOSIS — D0511 Intraductal carcinoma in situ of right breast: Secondary | ICD-10-CM

## 2016-09-29 DIAGNOSIS — M5136 Other intervertebral disc degeneration, lumbar region: Secondary | ICD-10-CM | POA: Insufficient documentation

## 2016-09-29 DIAGNOSIS — Z86000 Personal history of in-situ neoplasm of breast: Secondary | ICD-10-CM

## 2016-09-29 NOTE — Progress Notes (Signed)
Here for follow up

## 2016-10-02 ENCOUNTER — Ambulatory Visit: Admission: RE | Admit: 2016-10-02 | Payer: PPO | Source: Ambulatory Visit

## 2016-10-02 ENCOUNTER — Encounter: Payer: Self-pay | Admitting: Oncology

## 2016-10-02 NOTE — Progress Notes (Signed)
Hematology/Oncology Consult note [facility]  [phone] [phone]  Patient Care Team: Rusty Aus, MD as PCP - General (Unknown Physician Specialty) Wellington Hampshire, MD as Consulting Physician (Cardiology)   Name of the patient: [name]  [id number]  [birth year]   Date of visit: 10/02/16  Diagnosis- h/o DCIS X2  Chief complaint/ Reason for visit- routine yearly f/u  Heme/Onc history: Patient is a 83 year old female was initially diagnosed with DCIS in 2007. She had DCIS again in 2013. At that time she underwent right mastectomy without reconstruction. She was also subsequently on anastrozole which she stopped in May 2017. Prior to that she was on tamoxifen for 5 years from 2007 to 2012  Interval history- doing well. Denies any complaints  ECOG PS- 1 Pain scale- 0   Review of systems- Review of Systems  Constitutional: Negative for chills, fever, malaise/fatigue and weight loss.  HENT: Negative for congestion, ear discharge and nosebleeds.   Eyes: Negative for blurred vision.  Respiratory: Negative for cough, hemoptysis, sputum production, shortness of breath and wheezing.   Cardiovascular: Negative for chest pain, palpitations, orthopnea and claudication.  Gastrointestinal: Negative for abdominal pain, blood in stool, constipation, diarrhea, heartburn, melena, nausea and vomiting.  Genitourinary: Negative for dysuria, flank pain, frequency, hematuria and urgency.  Musculoskeletal: Negative for back pain, joint pain and myalgias.  Skin: Negative for rash.  Neurological: Negative for dizziness, tingling, focal weakness, seizures, weakness and headaches.  Endo/Heme/Allergies: Does not bruise/bleed easily.  Psychiatric/Behavioral: Negative for depression and suicidal ideas. The patient does not have insomnia.        Allergies  Allergen Reactions  . Amlodipine Nausea And Vomiting  . Ciprofloxacin Nausea Only  .  Doxycycline Itching and Other (See Comments)    shaky  . Lipitor [Atorvastatin] Other (See Comments)    Joint pain   . Morphine And Related Nausea And Vomiting  . Macrobid [Nitrofurantoin Macrocrystal] Rash  . Nitrofurantoin Rash  . Penicillins Rash and Other (See Comments)    Has patient had a PCN reaction causing immediate rash, facial/tongue/throat swelling, SOB or lightheadedness with hypotension: yes Has patient had a PCN reaction causing severe rash involving mucus membranes or skin necrosis: no Has patient had a PCN reaction that required hospitalization no Has patient had a PCN reaction occurring within the last 10 years: no If all of the above answers are "NO", then may proceed with Cephalosporin use.   . Sulfa Antibiotics Rash  . Sulfamethoxazole-Trimethoprim Rash     Past Medical History:  Diagnosis Date  . Arthritis    "back, hands" (09/08/2015)  . Atrial fibrillation ([place])    Echocardiogram 5/12: normal LV function, moderate LAE, mild RAE, mild AI and MR and mild-to-moderate TR.  Myoview 5/ 12 showed an ejection fraction of 73% and normal perfusion.   Patient underwent elective DCCV on 08/26/10  . Breast cancer ([place]) 2007, 2013   right breast- 2007 radiation  . Cancer of right breast ([place])   . Chronic lower back pain   . Complication of anesthesia    "took me about 1 week to know what was going on after one of my knee ORs"  . COPD (chronic obstructive pulmonary disease) ([place])   . DDD (degenerative disc disease), lumbar   . 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 Dysrhythmia   . GERD (gastroesophageal reflux disease)   . Hyperlipidemia   . Hypertension   . Hypothyroid   .  PAD (peripheral artery disease) ([place])   . PVD (peripheral vascular disease) ([place])      Past Surgical History:  Procedure Laterality Date  . ABDOMINAL EXPLORATION SURGERY     "had to go back in after hysterectomy & check on stitch dr had put near my bladder; don't know if  they took it out; had to wear catheter for 1 month"  . ABDOMINAL HYSTERECTOMY    . APPENDECTOMY    . BACK SURGERY    . BALLOON ANGIOPLASTY, ARTERY Right 09/08/2015   superficial femoral  . BREAST EXCISIONAL BIOPSY Right 2007   positive  . BREAST SURGERY    . CARDIOVERSION  11/10/2011   Procedure: CARDIOVERSION;  Surgeon: Lelon Perla, MD;  Location: [facility];  Service: Cardiovascular;  Laterality: N/A;  . CARDIOVERSION  08/26/2010  . COLONOSCOPY WITH PROPOFOL N/A 01/04/2016   Procedure: COLONOSCOPY WITH PROPOFOL;  Surgeon: Lollie Sails, MD;  Location: [facility]COPY;  Service: Endoscopy;  Laterality: N/A;  . HALLUX VALGUS CORRECTION    . JOINT REPLACEMENT    . LUMBAR DISC SURGERY    . MASTECTOMY Right 2013   positive  . PERIPHERAL VASCULAR CATHETERIZATION N/A 09/08/2015   Procedure: Abdominal Aortogram w/Lower Extremity;  Surgeon: Wellington Hampshire, MD;  Location: [place] CV LAB;  Service: Cardiovascular;  Laterality: N/A;  . PERIPHERAL VASCULAR CATHETERIZATION  09/08/2015   Procedure: Peripheral Vascular Balloon Angioplasty;  Surgeon: Wellington Hampshire, MD;  Location: [place] [facility];  Service: Cardiovascular;;  . REVISION TOTAL KNEE ARTHROPLASTY Right   . rt.ankle ulcer surgery  2013  . TEMPOROMANDIBULAR JOINT ARTHROPLASTY    . TMJ ARTHROPLASTY    . TONSILLECTOMY    . TOTAL KNEE ARTHROPLASTY Right     Social History   Social History  . Marital status: Married    Spouse name: N/A  . Number of children: 2  . Years of education: N/A   Occupational History  . Not on file.   Social History Main Topics  . Smoking status: Never Smoker  . Smokeless tobacco: Never Used  . Alcohol use No  . Drug use: No  . Sexual activity: No   Other Topics Concern  . Not on file   Social History Narrative  . No narrative on file    Family History  Problem Relation Age of Onset  . Heart attack Father   . Coronary artery disease Sister 51       MI  . Breast cancer Maternal  Aunt      Current Outpatient Prescriptions:  .  amiodarone (PACERONE) 200 MG tablet, Take 1 tablet (200 mg total) by mouth daily., Disp: 90 tablet, Rfl: 3 .  ascorbic acid (VITAMIN C) 500 MG tablet, Take 500 mg by mouth daily., Disp: , Rfl:  .  aspirin EC 81 MG tablet, Take 1 tablet (81 mg total) by mouth daily., Disp: 90 tablet, Rfl: 3 .  calcium-vitamin D (OSCAL WITH D) 500-200 MG-UNIT per tablet, Take 1 tablet by mouth daily with breakfast., Disp: , Rfl:  .  cholecalciferol (VITAMIN D) 1000 units tablet, Take 1,000 Units by mouth daily., Disp: , Rfl:  .  Iron-Vitamin C 65-125 MG TABS, Take 1 tablet by mouth daily., Disp: , Rfl:  .  levothyroxine (SYNTHROID, LEVOTHROID) 150 MCG tablet, Take 150 mcg by mouth daily before breakfast., Disp: , Rfl:  .  lisinopril (PRINIVIL,ZESTRIL) 10 MG tablet, Take 10 mg by mouth daily., Disp: , Rfl:  .  Multiple Vitamin (  MULTIVITAMIN) tablet, Take 1 tablet by mouth daily.  , Disp: , Rfl:  .  pantoprazole (PROTONIX) 40 MG tablet, Take 1 tablet (40 mg total) by mouth daily., Disp: 30 tablet, Rfl: 0 .  potassium chloride (K-DUR) 10 MEQ tablet, Take by mouth., Disp: , Rfl:  .  pravastatin (PRAVACHOL) 40 MG tablet, Take 40 mg by mouth every morning., Disp: , Rfl: 3 .  spironolactone (ALDACTONE) 25 MG tablet, Take 25 mg by mouth daily., Disp: , Rfl:  .  torsemide (DEMADEX) 20 MG tablet, TAKE 1 TABLET (20 MG TOTAL) BY MOUTH ONCE DAILY., Disp: , Rfl: 3 .  venlafaxine (EFFEXOR-XR) 75 MG 24 hr capsule, Take 75 mg by mouth daily.  , Disp: , Rfl:  .  acetaminophen (TYLENOL) 500 MG tablet, Take 500 mg by mouth every 6 (six) hours as needed (For pain.)., Disp: , Rfl:  .  docusate sodium (COLACE) 100 MG capsule, Take 100 mg by mouth daily as needed for mild constipation., Disp: , Rfl:  .  feeding supplement, ENSURE ENLIVE, (ENSURE ENLIVE) LIQD, Take 237 mLs by mouth 3 (three) times daily between meals. (Patient not taking: Reported on 09/29/2016), Disp: 237 mL, Rfl: 12 .   hydrALAZINE (APRESOLINE) 25 MG tablet, Take 1 tablet (25 mg total) by mouth every 8 (eight) hours. (Patient not taking: Reported on 09/29/2016), Disp: 30 tablet, Rfl: 0 .  metaxalone (SKELAXIN) 800 MG tablet, Take 1 tablet (800 mg total) by mouth 3 (three) times daily. (Patient not taking: Reported on 09/29/2016), Disp: 30 tablet, Rfl: 0 .  rosuvastatin (CRESTOR) 10 MG tablet, Take 1 tablet (10 mg total) by mouth daily., Disp: 30 tablet, Rfl: 6 .  temazepam (RESTORIL) 15 MG capsule, Take 15 mg by mouth at bedtime as needed for sleep. , Disp: , Rfl: 5  Physical exam:  Vitals:   09/29/16 1553  BP: (!) 173/51  Pulse: 75  Resp: 18  Temp: 97.7 F (36.5 C)  TempSrc: Tympanic  Weight: 131 lb 11.2 oz (59.7 kg)   Physical Exam  Constitutional: She is oriented to person, place, and time and well-developed, well-nourished, and in no distress.  HENT:  Head: Normocephalic and atraumatic.  Eyes: EOM are normal. Pupils are equal, round, and reactive to light.  Neck: Normal range of motion.  Cardiovascular: Normal rate, regular rhythm and normal heart sounds.   Pulmonary/Chest: Effort normal and breath sounds normal.  Abdominal: Soft. Bowel sounds are normal.  Neurological: She is alert and oriented to person, place, and time.  Skin: Skin is warm and dry.   Breast exam is performed in seated and lying down position. Patient is status post right mastectomy without reconstruction. There is no evidence of any chest wall recurrence. No evidence of bilateral axillary adenopathy. No palpable masses in  Left breast  CMP Latest Ref Rng & Units 01/14/2016  Glucose 65 - 99 mg/dL 95  BUN 6 - 20 mg/dL 17  Creatinine 0.44 - 1.00 mg/dL 1.01(H)  Sodium 135 - 145 mmol/L 144  Potassium 3.5 - 5.1 mmol/L 3.9  Chloride 101 - 111 mmol/L 116(H)  CO2 22 - 32 mmol/L 24  Calcium 8.9 - 10.3 mg/dL 8.5(L)  Total Protein 6.5 - 8.1 g/dL -  Total Bilirubin 0.3 - 1.2 mg/dL -  Alkaline Phos 38 - 126 U/L -  AST 15 - 41 U/L -    ALT 14 - 54 U/L -     Assessment and plan- Patient is a 83 y.o. female with h/o DCIS  in 2007 and 2013 currently on yearly surveillance  Clinically she is doing well and there is no evidence of recurrence on todays exam.DR. Sabra Heck will order her yearly mamograms. I will see her back in 1 years time   Visit Diagnosis 1. Ductal carcinoma in situ (DCIS) of right breast      Dr. Randa Evens, MD, MPH [facility] at [facility] Pager- [phone] 10/02/2016 7:51 AM

## 2016-10-24 DIAGNOSIS — C50111 Malignant neoplasm of central portion of right female breast: Secondary | ICD-10-CM | POA: Diagnosis not present

## 2016-10-24 DIAGNOSIS — Z4431 Encounter for fitting and adjustment of external right breast prosthesis: Secondary | ICD-10-CM | POA: Diagnosis not present

## 2016-11-02 DIAGNOSIS — C50111 Malignant neoplasm of central portion of right female breast: Secondary | ICD-10-CM | POA: Diagnosis not present

## 2016-11-02 DIAGNOSIS — Z4431 Encounter for fitting and adjustment of external right breast prosthesis: Secondary | ICD-10-CM | POA: Diagnosis not present

## 2016-11-09 DIAGNOSIS — C50111 Malignant neoplasm of central portion of right female breast: Secondary | ICD-10-CM | POA: Diagnosis not present

## 2016-11-09 DIAGNOSIS — Z4431 Encounter for fitting and adjustment of external right breast prosthesis: Secondary | ICD-10-CM | POA: Diagnosis not present

## 2016-11-15 DIAGNOSIS — M5136 Other intervertebral disc degeneration, lumbar region: Secondary | ICD-10-CM | POA: Diagnosis not present

## 2016-11-15 DIAGNOSIS — M5416 Radiculopathy, lumbar region: Secondary | ICD-10-CM | POA: Diagnosis not present

## 2016-11-15 DIAGNOSIS — M48062 Spinal stenosis, lumbar region with neurogenic claudication: Secondary | ICD-10-CM | POA: Diagnosis not present

## 2016-11-22 ENCOUNTER — Ambulatory Visit
Admission: RE | Admit: 2016-11-22 | Discharge: 2016-11-22 | Disposition: A | Discharge status: Home / Self Care | Payer: PPO | Source: Ambulatory Visit | Attending: Internal Medicine | Admitting: Internal Medicine

## 2016-11-22 DIAGNOSIS — Z1231 Encounter for screening mammogram for malignant neoplasm of breast: Secondary | ICD-10-CM | POA: Insufficient documentation

## 2016-12-13 DIAGNOSIS — I5043 Acute on chronic combined systolic (congestive) and diastolic (congestive) heart failure: Secondary | ICD-10-CM | POA: Diagnosis not present

## 2016-12-15 DIAGNOSIS — N184 Chronic kidney disease, stage 4 (severe): Secondary | ICD-10-CM | POA: Diagnosis not present

## 2016-12-15 DIAGNOSIS — I5043 Acute on chronic combined systolic (congestive) and diastolic (congestive) heart failure: Secondary | ICD-10-CM | POA: Diagnosis not present

## 2016-12-15 DIAGNOSIS — I482 Chronic atrial fibrillation: Secondary | ICD-10-CM | POA: Diagnosis not present

## 2016-12-15 DIAGNOSIS — N3090 Cystitis, unspecified without hematuria: Secondary | ICD-10-CM | POA: Diagnosis not present

## 2016-12-15 DIAGNOSIS — D500 Iron deficiency anemia secondary to blood loss (chronic): Secondary | ICD-10-CM | POA: Diagnosis not present

## 2016-12-26 DIAGNOSIS — M5136 Other intervertebral disc degeneration, lumbar region: Secondary | ICD-10-CM | POA: Diagnosis not present

## 2016-12-26 DIAGNOSIS — M5416 Radiculopathy, lumbar region: Secondary | ICD-10-CM | POA: Diagnosis not present

## 2016-12-26 DIAGNOSIS — M48062 Spinal stenosis, lumbar region with neurogenic claudication: Secondary | ICD-10-CM | POA: Diagnosis not present

## 2017-01-09 ENCOUNTER — Ambulatory Visit: Payer: PPO | Admitting: Cardiovascular Disease

## 2017-01-09 NOTE — Progress Notes (Deleted)
Cardiology Office Note   Date:  01/09/2017   ID:  [name], DOB 31-Mar-1933, MRN [id number]  PCP:  Rusty Aus, MD  Cardiologist:   Kathlyn Sacramento, MD   No chief complaint on file.     History of Present Illness: [name] is a 83 y.o. female who presents for peripheral arterial disease and atrial fibrillation.  She had previous previous cardioversion in 2012. She has been maintaining in sinus rhythm with amiodarone. She has no history of coronary artery disease with previous nuclear stress test in 2012. Anticoagulation with warfarin was stopped due to frequent falls. She has chronic kidney disease with chronic leg edema.  She is known to have peripheral arterial disease. She underwent angiography in May 2017 due to nonhealing ulcer on the right foot. Angiography showed significant proximal right popliteal artery stenosis with short occlusion of the right anterior tibial artery and significant disease affecting the TP trunk with occlusion of the left posterior tibial artery. I performed successful angioplasty of the anterior tibial artery as well as drug-coated balloon angioplasty of the right popliteal artery.  Most recent vascular studies in January of this year showed normal ABI bilaterally with no significant obstructive disease on duplex. She is doing reasonably well overall with no chest pain or shortness of breath. She has chronic leg pain which does not seem to be due to claudication.    Past Medical History:  Diagnosis Date  . Arthritis    "back, hands" (09/08/2015)  . Atrial fibrillation ([place])    Echocardiogram 5/12: normal LV function, moderate LAE, mild RAE, mild AI and MR and mild-to-moderate TR.  Myoview 5/ 12 showed an ejection fraction of 73% and normal perfusion.   Patient underwent elective DCCV on 08/26/10  . Breast cancer ([place]) 2007, 2013   right breast- 2007 radiation-mastectomy  . Cancer of right breast ([place])   . Chronic lower back pain   . Complication  of anesthesia    "took me about 1 week to know what was going on after one of my knee ORs"  . COPD (chronic obstructive pulmonary disease) ([place])   . DDD (degenerative disc disease), lumbar   . 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 Dysrhythmia   . GERD (gastroesophageal reflux disease)   . Hyperlipidemia   . Hypertension   . Hypothyroid   . PAD (peripheral artery disease) ([place])   . PVD (peripheral vascular disease) ([place])     Past Surgical History:  Procedure Laterality Date  . ABDOMINAL EXPLORATION SURGERY     "had to go back in after hysterectomy & check on stitch dr had put near my bladder; don't know if they took it out; had to wear catheter for 1 month"  . ABDOMINAL HYSTERECTOMY    . APPENDECTOMY    . BACK SURGERY    . BALLOON ANGIOPLASTY, ARTERY Right 09/08/2015   superficial femoral  . BREAST EXCISIONAL BIOPSY Right 2007   positive  . BREAST SURGERY    . CARDIOVERSION  11/10/2011   Procedure: CARDIOVERSION;  Surgeon: Lelon Perla, MD;  Location: [place];  Service: Cardiovascular;  Laterality: N/A;  . CARDIOVERSION  08/26/2010  . COLONOSCOPY WITH PROPOFOL N/A 01/04/2016   Procedure: COLONOSCOPY WITH PROPOFOL;  Surgeon: Lollie Sails, MD;  Location: [facility] ENDOSCOPY;  Service: Endoscopy;  Laterality: N/A;  . HALLUX VALGUS CORRECTION    . JOINT REPLACEMENT    . LUMBAR DISC SURGERY    .  MASTECTOMY Right 2013   positive  . PERIPHERAL VASCULAR CATHETERIZATION N/A 09/08/2015   Procedure: Abdominal Aortogram w/Lower Extremity;  Surgeon: Wellington Hampshire, MD;  Location: [place] CV LAB;  Service: Cardiovascular;  Laterality: N/A;  . PERIPHERAL VASCULAR CATHETERIZATION  09/08/2015   Procedure: Peripheral Vascular Balloon Angioplasty;  Surgeon: Wellington Hampshire, MD;  Location: [place] CV [facility];  Service: Cardiovascular;;  . REVISION TOTAL KNEE ARTHROPLASTY Right   . rt.ankle ulcer surgery  2013  . TEMPOROMANDIBULAR JOINT ARTHROPLASTY    . TMJ  ARTHROPLASTY    . TONSILLECTOMY    . TOTAL KNEE ARTHROPLASTY Right      Current Outpatient Prescriptions  Medication Sig Dispense Refill  . acetaminophen (TYLENOL) 500 MG tablet Take 500 mg by mouth every 6 (six) hours as needed (For pain.).    Marland Kitchen amiodarone (PACERONE) 200 MG tablet Take 1 tablet (200 mg total) by mouth daily. 90 tablet 3  . ascorbic acid (VITAMIN C) 500 MG tablet Take 500 mg by mouth daily.    Marland Kitchen aspirin EC 81 MG tablet Take 1 tablet (81 mg total) by mouth daily. 90 tablet 3  . calcium-vitamin D (OSCAL WITH D) 500-200 MG-UNIT per tablet Take 1 tablet by mouth daily with breakfast.    . cholecalciferol (VITAMIN D) 1000 units tablet Take 1,000 Units by mouth daily.    Marland Kitchen docusate sodium (COLACE) 100 MG capsule Take 100 mg by mouth daily as needed for mild constipation.    . feeding supplement, ENSURE ENLIVE, (ENSURE ENLIVE) LIQD Take 237 mLs by mouth 3 (three) times daily between meals. (Patient not taking: Reported on 09/29/2016) 237 mL 12  . hydrALAZINE (APRESOLINE) 25 MG tablet Take 1 tablet (25 mg total) by mouth every 8 (eight) hours. (Patient not taking: Reported on 09/29/2016) 30 tablet 0  . Iron-Vitamin C 65-125 MG TABS Take 1 tablet by mouth daily.    Marland Kitchen levothyroxine (SYNTHROID, LEVOTHROID) 150 MCG tablet Take 150 mcg by mouth daily before breakfast.    . lisinopril (PRINIVIL,ZESTRIL) 10 MG tablet Take 10 mg by mouth daily.    . metaxalone (SKELAXIN) 800 MG tablet Take 1 tablet (800 mg total) by mouth 3 (three) times daily. (Patient not taking: Reported on 09/29/2016) 30 tablet 0  . Multiple Vitamin (MULTIVITAMIN) tablet Take 1 tablet by mouth daily.      . pantoprazole (PROTONIX) 40 MG tablet Take 1 tablet (40 mg total) by mouth daily. 30 tablet 0  . potassium chloride (K-DUR) 10 MEQ tablet Take by mouth.    . pravastatin (PRAVACHOL) 40 MG tablet Take 40 mg by mouth every morning.  3  . rosuvastatin (CRESTOR) 10 MG tablet Take 1 tablet (10 mg total) by mouth daily. 30  tablet 6  . spironolactone (ALDACTONE) 25 MG tablet Take 25 mg by mouth daily.    . temazepam (RESTORIL) 15 MG capsule Take 15 mg by mouth at bedtime as needed for sleep.   5  . torsemide (DEMADEX) 20 MG tablet TAKE 1 TABLET (20 MG TOTAL) BY MOUTH ONCE DAILY.  3  . venlafaxine (EFFEXOR-XR) 75 MG 24 hr capsule Take 75 mg by mouth daily.       No current facility-administered medications for this visit.     Allergies:   Amlodipine; Ciprofloxacin; Doxycycline; Lipitor [atorvastatin]; Morphine and related; Macrobid [nitrofurantoin macrocrystal]; Nitrofurantoin; Penicillins; Sulfa antibiotics; and Sulfamethoxazole-trimethoprim    Social History:  The patient  reports that she has never smoked. She has never used smokeless tobacco.  She reports that she does not drink alcohol or use drugs.   Family History:  The patient's family history includes Breast cancer in her maternal aunt; Coronary artery disease (age of onset: 30) in her sister; Heart attack in her father.    ROS:  Please see the history of present illness.   Otherwise, review of systems are positive for none.   All other systems are reviewed and negative.    PHYSICAL EXAM: VS:  There were no vitals taken for this visit. , BMI There is no height or weight on file to calculate BMI. GEN: Well nourished, well developed, in no acute distress  HEENT: normal  Neck: no JVD, carotid bruits, or masses Cardiac: RRR; no murmurs, rubs, or gallops,no edema  Respiratory:  clear to auscultation bilaterally, normal work of breathing GI: soft, nontender, nondistended, + BS MS: no deformity or atrophy  Skin: warm and dry, no rash Neuro:  Strength and sensation are intact Psych: euthymic mood, full affect Femoral pulses are normal.   EKG:  EKG is  ordered today.  EKG showed normal sinus rhythm with mildly prolonged QT interval.  Recent Labs: 01/12/2016: TSH 0.918 01/14/2016: BUN 17; Creatinine, Ser 1.01; Potassium 3.9; Sodium 144 01/17/2016:  Hemoglobin 12.6; Platelets 242    Lipid Panel No results found for: CHOL, TRIG, HDL, CHOLHDL, VLDL, LDLCALC, LDLDIRECT    Wt Readings from Last 3 Encounters:  09/29/16 131 lb 11.2 oz (59.7 kg)  07/03/16 126 lb 8 oz (57.4 kg)  01/17/16 130 lb (59 kg)        ASSESSMENT AND PLAN:  1.  Peripheral arterial disease: There is no open ulcer on the right lateral ankle anymore.  Most recent vascular studies in January showed significant improvement in ABI to normal. I discontinued Plavix today. Continue aspirin indefinitely.  2. Persistent atrial fibrillation: Currently maintaining in sinus rhythm with amiodarone.   3. Chronic diastolic heart failure: She appears to be euvolemic.  4. Essential hypertension: Blood pressure is controlled on current medications.  5. Hyperlipidemia: Continue treatment with rosuvastatin. Most recent lipid profile in January showed an LDL of 115. We should consider increasing the dose of rosuvastatin.  Disposition:   FU with me in 6 month  Signed,  Kathlyn Sacramento, MD  01/09/2017 1:49 PM     [facility]

## 2017-01-10 ENCOUNTER — Encounter: Payer: Self-pay | Admitting: Cardiovascular Disease

## 2017-01-15 DIAGNOSIS — D500 Iron deficiency anemia secondary to blood loss (chronic): Secondary | ICD-10-CM | POA: Diagnosis not present

## 2017-01-15 DIAGNOSIS — N184 Chronic kidney disease, stage 4 (severe): Secondary | ICD-10-CM | POA: Diagnosis not present

## 2017-01-30 ENCOUNTER — Telehealth: Payer: Self-pay | Admitting: Cardiovascular Disease

## 2017-01-30 ENCOUNTER — Other Ambulatory Visit: Payer: Self-pay | Admitting: Physical Medicine and Rehabilitation

## 2017-01-30 DIAGNOSIS — M5416 Radiculopathy, lumbar region: Secondary | ICD-10-CM

## 2017-01-30 NOTE — Telephone Encounter (Signed)
S/w pt. She reports frequent indigestion extending up into her neck after eating. Sx are resolved after taking two Tums.  She has had periods of pain under her left breast but not at the same time as indigestion. Sx have been ongoing for 6 months.  She feels "exhausted" after walking but this is not new. Denies chest, jaw, or arm pain, SOB, nausea or diaphoresis.  Pt has hx of acid refulx and takes medication as prescribed by PCP. I reviewed s/s that would require immediate attention in the ER. Suggested she f/u w/PCP regarding indigestion and pain under breast. Pt verbalized understanding and is agreeable w/plan.

## 2017-01-30 NOTE — Telephone Encounter (Signed)
Patient having frequent indigestion and L side pain at breast   Please call patient to discuss need to be seen sooner

## 2017-01-31 DIAGNOSIS — L503 Dermatographic urticaria: Secondary | ICD-10-CM | POA: Diagnosis not present

## 2017-01-31 DIAGNOSIS — D692 Other nonthrombocytopenic purpura: Secondary | ICD-10-CM | POA: Diagnosis not present

## 2017-02-05 ENCOUNTER — Telehealth: Payer: Self-pay | Admitting: Cardiovascular Disease

## 2017-02-05 ENCOUNTER — Ambulatory Visit
Admission: RE | Admit: 2017-02-05 | Discharge: 2017-02-05 | Disposition: A | Discharge status: Home / Self Care | Payer: PPO | Source: Ambulatory Visit | Attending: Physical Medicine and Rehabilitation | Admitting: Physical Medicine and Rehabilitation

## 2017-02-05 DIAGNOSIS — M5416 Radiculopathy, lumbar region: Secondary | ICD-10-CM

## 2017-02-05 DIAGNOSIS — M48061 Spinal stenosis, lumbar region without neurogenic claudication: Secondary | ICD-10-CM | POA: Diagnosis not present

## 2017-02-05 DIAGNOSIS — M2578 Osteophyte, vertebrae: Secondary | ICD-10-CM | POA: Diagnosis not present

## 2017-02-05 DIAGNOSIS — M4726 Other spondylosis with radiculopathy, lumbar region: Secondary | ICD-10-CM | POA: Insufficient documentation

## 2017-02-05 NOTE — Telephone Encounter (Signed)
Called patient about Thursday, October 25 openings. She is agreeable to 2:40pm appt and is appreciative of the call.  Routed to scheduling.

## 2017-02-08 ENCOUNTER — Ambulatory Visit (INDEPENDENT_AMBULATORY_CARE_PROVIDER_SITE_OTHER): Payer: PPO | Admitting: Cardiovascular Disease

## 2017-02-08 ENCOUNTER — Encounter: Payer: Self-pay | Admitting: Cardiovascular Disease

## 2017-02-08 VITALS — BP 138/58 | HR 70 | Ht 68.0 in | Wt 134.2 lb

## 2017-02-08 DIAGNOSIS — I481 Persistent atrial fibrillation: Secondary | ICD-10-CM

## 2017-02-08 DIAGNOSIS — E785 Hyperlipidemia, unspecified: Secondary | ICD-10-CM | POA: Diagnosis not present

## 2017-02-08 DIAGNOSIS — I5032 Chronic diastolic (congestive) heart failure: Secondary | ICD-10-CM

## 2017-02-08 DIAGNOSIS — I10 Essential (primary) hypertension: Secondary | ICD-10-CM

## 2017-02-08 DIAGNOSIS — I739 Peripheral vascular disease, unspecified: Secondary | ICD-10-CM

## 2017-02-08 DIAGNOSIS — I4819 Other persistent atrial fibrillation: Secondary | ICD-10-CM

## 2017-02-08 MED ORDER — ROSUVASTATIN CALCIUM 10 MG PO TABS: 10.0000 mg | ORAL_TABLET | Freq: Every day | ORAL | 3 refills | Status: DC

## 2017-02-08 NOTE — Progress Notes (Signed)
Cardiology Office Note   Date:  02/08/2017   ID:  [name], DOB Sep 11, 1932, MRN [id number]  PCP:  Rusty Aus, MD  Cardiologist:   Kathlyn Sacramento, MD   Chief Complaint  Patient presents with  . OTHER    6 month f/u c/o sob with exertion. Meds reviewed verbally with pt.      History of Present Illness: [name] is a 83 y.o. female who presents for peripheral arterial disease and atrial fibrillation.  She had previous previous cardioversion in 2012. She has been maintaining in sinus rhythm with amiodarone. She has no history of coronary artery disease with previous nuclear stress test in 2012. Anticoagulation with warfarin was stopped due to frequent falls. She has chronic kidney disease with chronic leg edema.  She is known to have peripheral arterial disease. She underwent angiography in May 2017 due to nonhealing ulcer on the right foot. Angiography showed significant proximal right popliteal artery stenosis with short occlusion of the right anterior tibial artery and significant disease affecting the TP trunk with occlusion of the left posterior tibial artery. I performed successful angioplasty of the anterior tibial artery as well as drug-coated balloon angioplasty of the right popliteal artery. ABI improved to normal on the right side.  She has chronic ulceration on the lateral right ankle area with no recent worsening. She complains of easy bruising is only on aspirin 81 mg daily.  She reports exertional dyspnea walking from the parking lot.   Past Medical History:  Diagnosis Date  . Arthritis    "back, hands" (09/08/2015)  . Atrial fibrillation ([place])    Echocardiogram 5/12: normal LV function, moderate LAE, mild RAE, mild AI and MR and mild-to-moderate TR.  Myoview 5/ 12 showed an ejection fraction of 73% and normal perfusion.   Patient underwent elective DCCV on 08/26/10  . Breast cancer ([place]) 2007, 2013   right breast- 2007 radiation-mastectomy  . Cancer of right  breast ([place])   . Chronic lower back pain   . Complication of anesthesia    "took me about 1 week to know what was going on after one of my knee ORs"  . COPD (chronic obstructive pulmonary disease) ([place])   . DDD (degenerative disc disease), lumbar   . 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 Dysrhythmia   . GERD (gastroesophageal reflux disease)   . Hyperlipidemia   . Hypertension   . Hypothyroid   . PAD (peripheral artery disease) ([place])   . PVD (peripheral vascular disease) ([place])     Past Surgical History:  Procedure Laterality Date  . ABDOMINAL EXPLORATION SURGERY     "had to go back in after hysterectomy & check on stitch dr had put near my bladder; don't know if they took it out; had to wear catheter for 1 month"  . ABDOMINAL HYSTERECTOMY    . APPENDECTOMY    . BACK SURGERY    . BALLOON ANGIOPLASTY, ARTERY Right 09/08/2015   superficial femoral  . BREAST EXCISIONAL BIOPSY Right 2007   positive  . BREAST SURGERY    . CARDIOVERSION  11/10/2011   Procedure: CARDIOVERSION;  Surgeon: Lelon Perla, MD;  Location: [place];  Service: Cardiovascular;  Laterality: N/A;  . CARDIOVERSION  08/26/2010  . COLONOSCOPY WITH PROPOFOL N/A 01/04/2016   Procedure: COLONOSCOPY WITH PROPOFOL;  Surgeon: Lollie Sails, MD;  Location[facility] ENDOSCOPY;  Service: Endoscopy;  Laterality: N/A;  . HALLUX VALGUS CORRECTION    .  JOINT REPLACEMENT    . LUMBAR DISC SURGERY    . MASTECTOMY Right 2013   positive  . PERIPHERAL VASCULAR CATHETERIZATION N/A 09/08/2015   Procedure: Abdominal Aortogram w/Lower Extremity;  Surgeon: Wellington Hampshire, MD;  Location: [place] [facility];  Service: Cardiovascular;  Laterality: N/A;  . PERIPHERAL VASCULAR CATHETERIZATION  09/08/2015   Procedure: Peripheral Vascular Balloon Angioplasty;  Surgeon: Wellington Hampshire, MD;  Location: [place] [facility];  Service: Cardiovascular;;  . REVISION TOTAL KNEE ARTHROPLASTY Right   . rt.ankle ulcer surgery  2013    . TEMPOROMANDIBULAR JOINT ARTHROPLASTY    . TMJ ARTHROPLASTY    . TONSILLECTOMY    . TOTAL KNEE ARTHROPLASTY Right      Current Outpatient Prescriptions  Medication Sig Dispense Refill  . acetaminophen (TYLENOL) 500 MG tablet Take 500 mg by mouth every 6 (six) hours as needed (For pain.).    Marland Kitchen amiodarone (PACERONE) 200 MG tablet Take 1 tablet (200 mg total) by mouth daily. 90 tablet 3  . ascorbic acid (VITAMIN C) 500 MG tablet Take 500 mg by mouth daily.    Marland Kitchen aspirin EC 81 MG tablet Take 1 tablet (81 mg total) by mouth daily. 90 tablet 3  . calcium-vitamin D (OSCAL WITH D) 500-200 MG-UNIT per tablet Take 1 tablet by mouth daily with breakfast.    . cholecalciferol (VITAMIN D) 1000 units tablet Take 1,000 Units by mouth daily.    Marland Kitchen docusate sodium (COLACE) 100 MG capsule Take 100 mg by mouth daily as needed for mild constipation.    . feeding supplement, ENSURE ENLIVE, (ENSURE ENLIVE) LIQD Take 237 mLs by mouth 3 (three) times daily between meals. 237 mL 12  . hydrALAZINE (APRESOLINE) 25 MG tablet Take 1 tablet (25 mg total) by mouth every 8 (eight) hours. 30 tablet 0  . Iron-Vitamin C 65-125 MG TABS Take 1 tablet by mouth daily.    Marland Kitchen levothyroxine (SYNTHROID, LEVOTHROID) 150 MCG tablet Take 150 mcg by mouth daily before breakfast.    . lisinopril (PRINIVIL,ZESTRIL) 10 MG tablet Take 10 mg by mouth daily.    . metaxalone (SKELAXIN) 800 MG tablet Take 1 tablet (800 mg total) by mouth 3 (three) times daily. 30 tablet 0  . Multiple Vitamin (MULTIVITAMIN) tablet Take 1 tablet by mouth daily.      . pantoprazole (PROTONIX) 40 MG tablet Take 1 tablet (40 mg total) by mouth daily. 30 tablet 0  . potassium chloride (K-DUR) 10 MEQ tablet Take by mouth.    . spironolactone (ALDACTONE) 25 MG tablet Take 25 mg by mouth daily.    . temazepam (RESTORIL) 15 MG capsule Take 15 mg by mouth at bedtime as needed for sleep.   5  . torsemide (DEMADEX) 20 MG tablet TAKE 1 TABLET (20 MG TOTAL) BY MOUTH ONCE  DAILY.  3  . venlafaxine (EFFEXOR-XR) 75 MG 24 hr capsule Take 75 mg by mouth daily.      . rosuvastatin (CRESTOR) 10 MG tablet Take 1 tablet (10 mg total) by mouth daily. 30 tablet 6   No current facility-administered medications for this visit.     Allergies:   Amlodipine; Ciprofloxacin; Doxycycline; Lipitor [atorvastatin]; Morphine and related; Macrobid [nitrofurantoin macrocrystal]; Nitrofurantoin; Penicillins; Sulfa antibiotics; and Sulfamethoxazole-trimethoprim    Social History:  The patient  reports that she has never smoked. She has never used smokeless tobacco. She reports that she does not drink alcohol or use drugs.   Family History:  The patient's family history  includes Breast cancer in her maternal aunt; Coronary artery disease (age of onset: 24) in her sister; Heart attack in her father.    ROS:  Please see the history of present illness.   Otherwise, review of systems are positive for none.   All other systems are reviewed and negative.    PHYSICAL EXAM: VS:  BP (!) 138/58 (BP Location: Left Arm, Patient Position: Sitting, Cuff Size: Normal)   Pulse 70   Ht 5\' 8"  (1.727 m)   Wt 134 lb 4 oz (60.9 kg)   BMI 20.41 kg/m  , BMI Body mass index is 20.41 kg/m. GEN: Well nourished, well developed, in no acute distress  HEENT: normal  Neck: no JVD, carotid bruits, or masses Cardiac: RRR; no murmurs, rubs, or gallops,no edema  Respiratory:  clear to auscultation bilaterally, normal work of breathing GI: soft, nontender, nondistended, + BS MS: no deformity or atrophy  Skin: warm and dry, no rash Neuro:  Strength and sensation are intact Psych: euthymic mood, full affect Femoral pulses are normal.   EKG:  EKG is  ordered today.  EKG showed normal sinus rhythm with mildly prolonged QT interval.  QTc is 481 ms.  Recent Labs: No results found for requested labs within last 8760 hours.    Lipid Panel No results found for: CHOL, TRIG, HDL, CHOLHDL, VLDL, LDLCALC,  LDLDIRECT    Wt Readings from Last 3 Encounters:  02/08/17 134 lb 4 oz (60.9 kg)  09/29/16 131 lb 11.2 oz (59.7 kg)  07/03/16 126 lb 8 oz (57.4 kg)        ASSESSMENT AND PLAN:  1.  Peripheral arterial disease: Chronic ulceration on the right ankle with no recent worsening.  This is likely not related to peripheral arterial disease as most recent ABI was improved to normal patent arteries.  2. Persistent atrial fibrillation: Currently maintaining in sinus rhythm with amiodarone.  She has been deemed not a good candidate for anticoagulation due to frequent falls.  3. Chronic diastolic heart failure: She appears to be euvolemic.  Most recent creatinine was 1.8.  4. Essential hypertension: Blood pressure is controlled on current medications.    5. Hyperlipidemia: Pravastatin and rosuvastatin were both listed in her medication list.  We called the pharmacy and she was on pravastatin.  Given her peripheral arterial disease and LDL above 100, we switched this to rosuvastatin 10 mg daily.  Disposition:   FU with me in 6 month  Signed,  Kathlyn Sacramento, MD  02/08/2017 9:36 AM    [place]

## 2017-02-08 NOTE — Patient Instructions (Addendum)
Medication Instructions:  Your physician has recommended you make the following change in your medication:  STOP taking pravastatin RESTART rosuvastatin 10mg  once daily. Prescription refill has been sent to [facility].   Labwork: none  Testing/Procedures: none  Follow-Up: Your physician wants you to follow-up in: 6 months with Dr. Fletcher Anon.  You will receive a reminder letter in the mail two months in advance. If you don't receive a letter, please call our office to schedule the follow-up appointment.   Any Other Special Instructions Will Be Listed Below (If Applicable).     If you need a refill on your cardiac medications before your next appointment, please call your pharmacy.

## 2017-02-12 DIAGNOSIS — H9202 Otalgia, left ear: Secondary | ICD-10-CM | POA: Diagnosis not present

## 2017-02-16 DIAGNOSIS — G8929 Other chronic pain: Secondary | ICD-10-CM | POA: Diagnosis not present

## 2017-02-16 DIAGNOSIS — M1712 Unilateral primary osteoarthritis, left knee: Secondary | ICD-10-CM | POA: Diagnosis not present

## 2017-02-16 DIAGNOSIS — M25562 Pain in left knee: Secondary | ICD-10-CM | POA: Diagnosis not present

## 2017-02-16 DIAGNOSIS — M65322 Trigger finger, left index finger: Secondary | ICD-10-CM | POA: Diagnosis not present

## 2017-03-02 DIAGNOSIS — R1084 Generalized abdominal pain: Secondary | ICD-10-CM | POA: Diagnosis not present

## 2017-03-02 DIAGNOSIS — R110 Nausea: Secondary | ICD-10-CM | POA: Diagnosis not present

## 2017-03-02 DIAGNOSIS — R197 Diarrhea, unspecified: Secondary | ICD-10-CM | POA: Diagnosis not present

## 2017-03-05 DIAGNOSIS — M1712 Unilateral primary osteoarthritis, left knee: Secondary | ICD-10-CM | POA: Diagnosis not present

## 2017-03-05 DIAGNOSIS — K5909 Other constipation: Secondary | ICD-10-CM | POA: Diagnosis not present

## 2017-03-12 DIAGNOSIS — M1712 Unilateral primary osteoarthritis, left knee: Secondary | ICD-10-CM | POA: Diagnosis not present

## 2017-03-15 DIAGNOSIS — D500 Iron deficiency anemia secondary to blood loss (chronic): Secondary | ICD-10-CM | POA: Diagnosis not present

## 2017-03-15 DIAGNOSIS — N184 Chronic kidney disease, stage 4 (severe): Secondary | ICD-10-CM | POA: Diagnosis not present

## 2017-03-19 ENCOUNTER — Ambulatory Visit: Payer: PPO | Admitting: Cardiovascular Disease

## 2017-03-19 DIAGNOSIS — Z85828 Personal history of other malignant neoplasm of skin: Secondary | ICD-10-CM | POA: Diagnosis not present

## 2017-03-19 DIAGNOSIS — D2261 Melanocytic nevi of right upper limb, including shoulder: Secondary | ICD-10-CM | POA: Diagnosis not present

## 2017-03-19 DIAGNOSIS — L570 Actinic keratosis: Secondary | ICD-10-CM | POA: Diagnosis not present

## 2017-03-19 DIAGNOSIS — D2272 Melanocytic nevi of left lower limb, including hip: Secondary | ICD-10-CM | POA: Diagnosis not present

## 2017-03-19 DIAGNOSIS — M1712 Unilateral primary osteoarthritis, left knee: Secondary | ICD-10-CM | POA: Diagnosis not present

## 2017-03-19 DIAGNOSIS — X32XXXA Exposure to sunlight, initial encounter: Secondary | ICD-10-CM | POA: Diagnosis not present

## 2017-03-19 DIAGNOSIS — D225 Melanocytic nevi of trunk: Secondary | ICD-10-CM | POA: Diagnosis not present

## 2017-03-21 DIAGNOSIS — L97309 Non-pressure chronic ulcer of unspecified ankle with unspecified severity: Secondary | ICD-10-CM | POA: Diagnosis not present

## 2017-03-21 DIAGNOSIS — I5043 Acute on chronic combined systolic (congestive) and diastolic (congestive) heart failure: Secondary | ICD-10-CM | POA: Diagnosis not present

## 2017-03-21 DIAGNOSIS — N184 Chronic kidney disease, stage 4 (severe): Secondary | ICD-10-CM | POA: Diagnosis not present

## 2017-03-21 DIAGNOSIS — Z23 Encounter for immunization: Secondary | ICD-10-CM | POA: Diagnosis not present

## 2017-03-21 DIAGNOSIS — E039 Hypothyroidism, unspecified: Secondary | ICD-10-CM | POA: Diagnosis not present

## 2017-04-17 DIAGNOSIS — I82409 Acute embolism and thrombosis of unspecified deep veins of unspecified lower extremity: Secondary | ICD-10-CM

## 2017-04-17 HISTORY — DX: Acute embolism and thrombosis of unspecified deep veins of unspecified lower extremity: I82.409

## 2017-05-14 ENCOUNTER — Other Ambulatory Visit: Payer: Self-pay | Admitting: Cardiovascular Disease

## 2017-05-14 DIAGNOSIS — I739 Peripheral vascular disease, unspecified: Secondary | ICD-10-CM

## 2017-05-23 ENCOUNTER — Ambulatory Visit (HOSPITAL_COMMUNITY)
Admission: RE | Admit: 2017-05-23 | Discharge: 2017-05-23 | Disposition: A | Discharge status: Home / Self Care | Payer: Medicare HMO | Source: Ambulatory Visit | Attending: Cardiovascular Disease | Admitting: Cardiovascular Disease

## 2017-05-23 DIAGNOSIS — I739 Peripheral vascular disease, unspecified: Secondary | ICD-10-CM

## 2017-05-23 DIAGNOSIS — I70201 Unspecified atherosclerosis of native arteries of extremities, right leg: Secondary | ICD-10-CM | POA: Diagnosis not present

## 2017-05-24 ENCOUNTER — Other Ambulatory Visit: Payer: Self-pay

## 2017-05-24 DIAGNOSIS — I739 Peripheral vascular disease, unspecified: Secondary | ICD-10-CM

## 2017-07-10 ENCOUNTER — Encounter: Payer: Self-pay | Admitting: *Deleted

## 2017-07-10 NOTE — Pre-Procedure Instructions (Signed)
NO BP / STICKS RIGHT ARM

## 2017-07-10 NOTE — Anesthesia Pain Management Evaluation Note (Addendum)
NO BP STICKS / RIGHT ARM / HISTORY OF TMJ SURGERY

## 2017-07-17 ENCOUNTER — Ambulatory Visit: Payer: Medicare HMO | Admitting: Anesthesiology

## 2017-07-17 ENCOUNTER — Encounter
Admission: RE | Disposition: A | Discharge status: Home / Self Care | Payer: Self-pay | Source: Ambulatory Visit | Attending: Ophthalmology

## 2017-07-17 ENCOUNTER — Encounter: Payer: Self-pay | Admitting: *Deleted

## 2017-07-17 ENCOUNTER — Ambulatory Visit
Admission: RE | Admit: 2017-07-17 | Discharge: 2017-07-17 | Disposition: A | Discharge status: Home / Self Care | Payer: Medicare HMO | Source: Ambulatory Visit | Attending: Ophthalmology | Admitting: Ophthalmology

## 2017-07-17 DIAGNOSIS — N179 Acute kidney failure, unspecified: Secondary | ICD-10-CM | POA: Diagnosis not present

## 2017-07-17 DIAGNOSIS — E7800 Pure hypercholesterolemia, unspecified: Secondary | ICD-10-CM | POA: Insufficient documentation

## 2017-07-17 DIAGNOSIS — Z96651 Presence of right artificial knee joint: Secondary | ICD-10-CM

## 2017-07-17 DIAGNOSIS — H2511 Age-related nuclear cataract, right eye: Secondary | ICD-10-CM | POA: Insufficient documentation

## 2017-07-17 DIAGNOSIS — F329 Major depressive disorder, single episode, unspecified: Secondary | ICD-10-CM | POA: Insufficient documentation

## 2017-07-17 DIAGNOSIS — E039 Hypothyroidism, unspecified: Secondary | ICD-10-CM | POA: Insufficient documentation

## 2017-07-17 DIAGNOSIS — Z885 Allergy status to narcotic agent status: Secondary | ICD-10-CM

## 2017-07-17 DIAGNOSIS — M7989 Other specified soft tissue disorders: Secondary | ICD-10-CM

## 2017-07-17 DIAGNOSIS — M1990 Unspecified osteoarthritis, unspecified site: Secondary | ICD-10-CM | POA: Insufficient documentation

## 2017-07-17 DIAGNOSIS — Z90710 Acquired absence of both cervix and uterus: Secondary | ICD-10-CM | POA: Insufficient documentation

## 2017-07-17 DIAGNOSIS — Z888 Allergy status to other drugs, medicaments and biological substances status: Secondary | ICD-10-CM

## 2017-07-17 DIAGNOSIS — I10 Essential (primary) hypertension: Secondary | ICD-10-CM

## 2017-07-17 DIAGNOSIS — R52 Pain, unspecified: Secondary | ICD-10-CM | POA: Diagnosis not present

## 2017-07-17 DIAGNOSIS — I081 Rheumatic disorders of both mitral and tricuspid valves: Secondary | ICD-10-CM

## 2017-07-17 DIAGNOSIS — Z881 Allergy status to other antibiotic agents status: Secondary | ICD-10-CM | POA: Insufficient documentation

## 2017-07-17 DIAGNOSIS — K219 Gastro-esophageal reflux disease without esophagitis: Secondary | ICD-10-CM

## 2017-07-17 DIAGNOSIS — J449 Chronic obstructive pulmonary disease, unspecified: Secondary | ICD-10-CM

## 2017-07-17 DIAGNOSIS — Z853 Personal history of malignant neoplasm of breast: Secondary | ICD-10-CM

## 2017-07-17 DIAGNOSIS — D649 Anemia, unspecified: Secondary | ICD-10-CM | POA: Insufficient documentation

## 2017-07-17 DIAGNOSIS — I4891 Unspecified atrial fibrillation: Secondary | ICD-10-CM | POA: Insufficient documentation

## 2017-07-17 HISTORY — DX: Depression, unspecified: F32.A

## 2017-07-17 HISTORY — DX: Major depressive disorder, single episode, unspecified: F32.9

## 2017-07-17 HISTORY — PX: CATARACT EXTRACTION W/PHACO: SHX586

## 2017-07-17 SURGERY — PHACOEMULSIFICATION, CATARACT, WITH IOL INSERTION
Anesthesia: Monitor Anesthesia Care | Site: Eye | Laterality: Right | Wound class: Clean

## 2017-07-17 MED ORDER — ARMC OPHTHALMIC DILATING DROPS
1.0000 "application " | OPHTHALMIC | Status: AC
  Administered 2017-07-17 (×3): 1 via OPHTHALMIC

## 2017-07-17 MED ORDER — POVIDONE-IODINE 5 % OP SOLN
OPHTHALMIC | Status: AC
  Filled 2017-07-17: qty 30

## 2017-07-17 MED ORDER — POVIDONE-IODINE 5 % OP SOLN
OPHTHALMIC | Status: DC | PRN
  Administered 2017-07-17: 1 via OPHTHALMIC

## 2017-07-17 MED ORDER — POLYMYXIN B-TRIMETHOPRIM 10000-0.1 UNIT/ML-% OP SOLN: 1.0000 [drp] | OPHTHALMIC | Status: DC | PRN

## 2017-07-17 MED ORDER — LIDOCAINE HCL (PF) 4 % IJ SOLN
INTRAOCULAR | Status: DC | PRN
  Administered 2017-07-17: 4 mL via OPHTHALMIC

## 2017-07-17 MED ORDER — POLYMYXIN B-TRIMETHOPRIM 10000-0.1 UNIT/ML-% OP SOLN
OPHTHALMIC | Status: DC | PRN
  Administered 2017-07-17: 2 [drp]

## 2017-07-17 MED ORDER — EPINEPHRINE PF 1 MG/ML IJ SOLN
INTRAOCULAR | Status: DC | PRN
  Administered 2017-07-17: 08:00:00 via OPHTHALMIC

## 2017-07-17 MED ORDER — LIDOCAINE HCL (PF) 4 % IJ SOLN
INTRAMUSCULAR | Status: AC
  Filled 2017-07-17: qty 5

## 2017-07-17 MED ORDER — CARBACHOL 0.01 % IO SOLN
INTRAOCULAR | Status: DC | PRN
  Administered 2017-07-17: 0.5 mL via INTRAOCULAR

## 2017-07-17 MED ORDER — POLYMYXIN B-TRIMETHOPRIM 10000-0.1 UNIT/ML-% OP SOLN
OPHTHALMIC | Status: AC
  Filled 2017-07-17: qty 10

## 2017-07-17 MED ORDER — NA CHONDROIT SULF-NA HYALURON 40-17 MG/ML IO SOLN
INTRAOCULAR | Status: AC
  Filled 2017-07-17: qty 1

## 2017-07-17 MED ORDER — DEXMEDETOMIDINE HCL 200 MCG/2ML IV SOLN
INTRAVENOUS | Status: DC | PRN
  Administered 2017-07-17 (×2): 4 ug via INTRAVENOUS

## 2017-07-17 MED ORDER — SODIUM CHLORIDE 0.9 % IV SOLN
INTRAVENOUS | Status: DC
  Administered 2017-07-17: 07:00:00 via INTRAVENOUS

## 2017-07-17 MED ORDER — ARMC OPHTHALMIC DILATING DROPS
OPHTHALMIC | Status: AC
  Administered 2017-07-17: 1 via OPHTHALMIC
  Filled 2017-07-17: qty 0.4

## 2017-07-17 MED ORDER — NA CHONDROIT SULF-NA HYALURON 40-17 MG/ML IO SOLN
INTRAOCULAR | Status: DC | PRN
  Administered 2017-07-17: 1 mL via INTRAOCULAR

## 2017-07-17 MED ORDER — EPINEPHRINE PF 1 MG/ML IJ SOLN
INTRAMUSCULAR | Status: AC
  Filled 2017-07-17: qty 2

## 2017-07-17 SURGICAL SUPPLY — 16 items
GLOVE BIO SURGEON STRL SZ8 (GLOVE) ×2 IMPLANT
GLOVE BIOGEL M 6.5 STRL (GLOVE) ×2 IMPLANT
GLOVE SURG LX 8.0 MICRO (GLOVE) ×1
GLOVE SURG LX STRL 8.0 MICRO (GLOVE) ×1 IMPLANT
GOWN STRL REUS W/ TWL LRG LVL3 (GOWN DISPOSABLE) ×2 IMPLANT
GOWN STRL REUS W/TWL LRG LVL3 (GOWN DISPOSABLE) ×4
LABEL CATARACT MEDS ST (LABEL) ×2 IMPLANT
LENS IOL TECNIS ITEC 19.5 (Intraocular Lens) ×1 IMPLANT
PACK CATARACT (MISCELLANEOUS) ×2 IMPLANT
PACK CATARACT BRASINGTON LX (MISCELLANEOUS) ×2 IMPLANT
PACK EYE AFTER SURG (MISCELLANEOUS) ×2 IMPLANT
SOL BSS BAG (MISCELLANEOUS) ×2
SOLUTION BSS BAG (MISCELLANEOUS) ×1 IMPLANT
SYR 5ML LL (SYRINGE) ×2 IMPLANT
WATER STERILE IRR 250ML POUR (IV SOLUTION) ×2 IMPLANT
WIPE NON LINTING 3.25X3.25 (MISCELLANEOUS) ×2 IMPLANT

## 2017-07-17 NOTE — Discharge Instructions (Signed)
Eye Surgery Discharge Instructions  Expect mild scratchy sensation or mild soreness. DO NOT RUB YOUR EYE!  The day of surgery:  Minimal physical activity, but bed rest is not required  No reading, computer work, or close hand work  No bending, lifting, or straining.  May watch TV  For 24 hours:  No driving, legal decisions, or alcoholic beverages  Safety precautions  Eat anything you prefer: It is better to start with liquids, then soup then solid foods.  _____ Eye patch should be worn until postoperative exam tomorrow.  ____ Solar shield eyeglasses should be worn for comfort in the sunlight/patch while sleeping  Resume all regular medications including aspirin or Coumadin if these were discontinued prior to surgery. You may shower, bathe, shave, or wash your hair. Tylenol may be taken for mild discomfort.  Call your doctor if you experience significant pain, nausea, or vomiting, fever > 101 or other signs of infection. [phone] or [phone] Specific instructions:  Follow-up Information    Birder Robson, MD Follow up in 1 day(s).   Specialty:  Ophthalmology Why:  07/18/2017 at 10:10 AM Contact information: [address] [place] [address] [phone]

## 2017-07-17 NOTE — H&P (Signed)
All labs reviewed. Abnormal studies sent to patients PCP when indicated.  Previous H&P reviewed, patient examined, there are NO CHANGES.  Latoya Saxon Porfilio4/2/20198:08 AM

## 2017-07-17 NOTE — Transfer of Care (Signed)
Immediate Anesthesia Transfer of Care Note  Patient: [name]  Procedure(s) Performed: CATARACT EXTRACTION PHACO AND INTRAOCULAR LENS PLACEMENT (IOC) (Right Eye)  Patient Location: Short Stay  Anesthesia Type:MAC  Level of Consciousness: awake, alert , oriented and patient cooperative  Airway & Oxygen Therapy: Patient Spontanous Breathing  Post-op Assessment: Report given to RN and Post -op Vital signs reviewed and stable  Post vital signs: Reviewed and stable  Last Vitals:  Vitals Value Taken Time  BP    Temp    Pulse    Resp    SpO2      Last Pain:  Vitals:   07/17/17 0709  TempSrc: Tympanic         Complications: No apparent anesthesia complications

## 2017-07-17 NOTE — Op Note (Signed)
PREOPERATIVE DIAGNOSIS:  Nuclear sclerotic cataract of the right eye.   POSTOPERATIVE DIAGNOSIS: NUCLEAR SCLEROTIC CATARACT RIGHT EYE   OPERATIVE PROCEDURE:  Procedure(s): CATARACT EXTRACTION PHACO AND INTRAOCULAR LENS PLACEMENT (IOC)   SURGEON:  Birder Robson, MD.   ANESTHESIA:  Anesthesiologist: Alphonsus Sias, MD CRNA: Eben Burow, CRNA  1.      Managed anesthesia care. 2.      Topical tetracaine drops followed by 2% Xylocaine jelly applied in the preoperative holding area.   COMPLICATIONS:  None.   TECHNIQUE:   Stop and chop   DESCRIPTION OF PROCEDURE:  The patient was examined and consented in the preoperative holding area where the aforementioned topical anesthesia was applied to the right eye and then brought back to the Operating Room where the right eye was prepped and draped in the usual sterile ophthalmic fashion and a lid speculum was placed. A paracentesis was created with the side port blade and the anterior chamber was filled with viscoelastic. A near clear corneal incision was performed with the steel keratome. A continuous curvilinear capsulorrhexis was performed with a cystotome followed by the capsulorrhexis forceps. Hydrodissection and hydrodelineation were carried out with BSS on a blunt cannula. The lens was removed in a stop and chop  technique and the remaining cortical material was removed with the irrigation-aspiration handpiece. The capsular bag was inflated with viscoelastic and the Technis [id number]  lens was placed in the capsular bag without complication. The remaining viscoelastic was removed from the eye with the irrigation-aspiration handpiece. The wounds were hydrated. The anterior chamber was flushed with Miostat and the eye was inflated to physiologic pressure. . The wounds were found to be water tight. The eye was dressed with Polytrim. The patient was given protective glasses to wear throughout the day and a shield with which to sleep tonight. The  patient was also given drops with which to begin a drop regimen today and will follow-up with me in one day. Implant Name Type Inv. Item Serial No. Manufacturer Lot No. LRB No. Used  LENS IOL DIOP 19.5 - [id number] Intraocular Lens LENS IOL DIOP 19.5 [id number]  Right 1   Procedure(s) with comments: CATARACT EXTRACTION PHACO AND INTRAOCULAR LENS PLACEMENT (IOC) (Right) - [place] 00:44.0 AP% 14.6 CDE 6.44 FLUID PACK LOT # [id number]  Electronically signed: Birder Robson 07/17/2017 8:33 AM

## 2017-07-17 NOTE — Anesthesia Postprocedure Evaluation (Signed)
Anesthesia Post Note  Patient: [name]  Procedure(s) Performed: CATARACT EXTRACTION PHACO AND INTRAOCULAR LENS PLACEMENT (IOC) (Right Eye)  Patient location during evaluation: Endoscopy Anesthesia Type: MAC Level of consciousness: awake and alert Pain management: pain level controlled Vital Signs Assessment: post-procedure vital signs reviewed and stable Respiratory status: spontaneous breathing, nonlabored ventilation and respiratory function stable Cardiovascular status: blood pressure returned to baseline and stable Postop Assessment: no apparent nausea or vomiting Anesthetic complications: no     Last Vitals:  Vitals:   07/17/17 0709 07/17/17 0835  BP: (!) 147/48 (!) 142/43  Pulse: 62 60  Resp: 20 18  Temp: 36.6 C 36.4 C  SpO2: 100% 100%    Last Pain:  Vitals:   07/17/17 0835  TempSrc: Tympanic  PainSc: 0-No pain                 Alphonsus Sias

## 2017-07-17 NOTE — Anesthesia Post-op Follow-up Note (Signed)
Anesthesia QCDR form completed.        

## 2017-07-17 NOTE — Anesthesia Preprocedure Evaluation (Addendum)
Anesthesia Evaluation  Patient identified by MRN, date of birth, ID band Patient awake    Reviewed: Allergy & Precautions, H&P , NPO status , reviewed documented beta blocker date and time   History of Anesthesia Complications (+) history of anesthetic complications  Airway Mallampati: II  TM Distance: >3 FB     Dental  (+) Caps   Pulmonary shortness of breath, COPD,    Pulmonary exam normal        Cardiovascular hypertension, + Peripheral Vascular Disease and +CHF  Normal cardiovascular exam+ dysrhythmias   2017 Study Conclusions  - Left ventricle: The cavity size was normal. Wall thickness was   increased in a pattern of mild LVH. Systolic function was normal.   The estimated ejection fraction was in the range of 60% to 65%.   Wall motion was normal; there were no regional wall motion   abnormalities. Features are consistent with a pseudonormal left   ventricular filling pattern, with concomitant abnormal relaxation   and increased filling pressure (grade 2 diastolic dysfunction). - Aortic valve: There was trivial regurgitation by color doppler. - Mitral valve: Calcified annulus. There was mild regurgitation. - Left atrium: The atrium was mildly dilated. - Tricuspid valve: There was mild-moderate regurgitation. - Pulmonary arteries: Systolic pressure was mildly to moderately   increased. PA peak pressure: 44 mm Hg (S).    Neuro/Psych PSYCHIATRIC DISORDERS Depression    GI/Hepatic GERD  Controlled and Medicated,  Endo/Other  Hypothyroidism   Renal/GU Renal disease     Musculoskeletal  (+) Arthritis , Osteoarthritis,    Abdominal   Peds  Hematology   Anesthesia Other Findings   Reproductive/Obstetrics                            Anesthesia Physical Anesthesia Plan  ASA: III  Anesthesia Plan: MAC   Post-op Pain Management:    Induction:   PONV Risk Score and Plan: Midazolam  and TIVA  Airway Management Planned:   Additional Equipment:   Intra-op Plan:   Post-operative Plan:   Informed Consent: I have reviewed the patients History and Physical, chart, labs and discussed the procedure including the risks, benefits and alternatives for the proposed anesthesia with the patient or authorized representative who has indicated his/her understanding and acceptance.   Dental Advisory Given  Plan Discussed with: CRNA  Anesthesia Plan Comments:         Anesthesia Quick Evaluation

## 2017-07-20 ENCOUNTER — Other Ambulatory Visit: Payer: Self-pay

## 2017-07-20 ENCOUNTER — Encounter: Payer: Self-pay | Admitting: Emergency Medicine

## 2017-07-20 ENCOUNTER — Inpatient Hospital Stay
Admission: EM | Admit: 2017-07-20 | Discharge: 2017-07-24 | DRG: 988 | Disposition: A | Discharge status: Home Health | Payer: Medicare HMO | Attending: Internal Medicine | Admitting: Internal Medicine

## 2017-07-20 DIAGNOSIS — Z96651 Presence of right artificial knee joint: Secondary | ICD-10-CM | POA: Diagnosis present

## 2017-07-20 DIAGNOSIS — N179 Acute kidney failure, unspecified: Principal | ICD-10-CM | POA: Diagnosis present

## 2017-07-20 DIAGNOSIS — I081 Rheumatic disorders of both mitral and tricuspid valves: Secondary | ICD-10-CM | POA: Diagnosis present

## 2017-07-20 DIAGNOSIS — F329 Major depressive disorder, single episode, unspecified: Secondary | ICD-10-CM | POA: Diagnosis present

## 2017-07-20 DIAGNOSIS — Z7982 Long term (current) use of aspirin: Secondary | ICD-10-CM

## 2017-07-20 DIAGNOSIS — J449 Chronic obstructive pulmonary disease, unspecified: Secondary | ICD-10-CM | POA: Diagnosis present

## 2017-07-20 DIAGNOSIS — Z882 Allergy status to sulfonamides status: Secondary | ICD-10-CM

## 2017-07-20 DIAGNOSIS — E7800 Pure hypercholesterolemia, unspecified: Secondary | ICD-10-CM | POA: Diagnosis present

## 2017-07-20 DIAGNOSIS — M1990 Unspecified osteoarthritis, unspecified site: Secondary | ICD-10-CM | POA: Diagnosis present

## 2017-07-20 DIAGNOSIS — Z885 Allergy status to narcotic agent status: Secondary | ICD-10-CM

## 2017-07-20 DIAGNOSIS — I129 Hypertensive chronic kidney disease with stage 1 through stage 4 chronic kidney disease, or unspecified chronic kidney disease: Secondary | ICD-10-CM | POA: Diagnosis present

## 2017-07-20 DIAGNOSIS — B9620 Unspecified Escherichia coli [E. coli] as the cause of diseases classified elsewhere: Secondary | ICD-10-CM | POA: Diagnosis present

## 2017-07-20 DIAGNOSIS — Z853 Personal history of malignant neoplasm of breast: Secondary | ICD-10-CM | POA: Diagnosis not present

## 2017-07-20 DIAGNOSIS — Z880 Allergy status to penicillin: Secondary | ICD-10-CM

## 2017-07-20 DIAGNOSIS — N289 Disorder of kidney and ureter, unspecified: Secondary | ICD-10-CM

## 2017-07-20 DIAGNOSIS — E039 Hypothyroidism, unspecified: Secondary | ICD-10-CM | POA: Diagnosis present

## 2017-07-20 DIAGNOSIS — E860 Dehydration: Secondary | ICD-10-CM | POA: Diagnosis present

## 2017-07-20 DIAGNOSIS — R7881 Bacteremia: Secondary | ICD-10-CM | POA: Diagnosis present

## 2017-07-20 DIAGNOSIS — Z66 Do not resuscitate: Secondary | ICD-10-CM | POA: Diagnosis present

## 2017-07-20 DIAGNOSIS — I739 Peripheral vascular disease, unspecified: Secondary | ICD-10-CM | POA: Diagnosis present

## 2017-07-20 DIAGNOSIS — N390 Urinary tract infection, site not specified: Secondary | ICD-10-CM

## 2017-07-20 DIAGNOSIS — M7989 Other specified soft tissue disorders: Secondary | ICD-10-CM | POA: Diagnosis present

## 2017-07-20 DIAGNOSIS — N183 Chronic kidney disease, stage 3 (moderate): Secondary | ICD-10-CM | POA: Diagnosis present

## 2017-07-20 DIAGNOSIS — Z23 Encounter for immunization: Secondary | ICD-10-CM | POA: Diagnosis present

## 2017-07-20 DIAGNOSIS — Z881 Allergy status to other antibiotic agents status: Secondary | ICD-10-CM

## 2017-07-20 DIAGNOSIS — D649 Anemia, unspecified: Secondary | ICD-10-CM | POA: Diagnosis present

## 2017-07-20 DIAGNOSIS — I4891 Unspecified atrial fibrillation: Secondary | ICD-10-CM | POA: Diagnosis present

## 2017-07-20 DIAGNOSIS — K219 Gastro-esophageal reflux disease without esophagitis: Secondary | ICD-10-CM | POA: Diagnosis present

## 2017-07-20 DIAGNOSIS — Z79899 Other long term (current) drug therapy: Secondary | ICD-10-CM

## 2017-07-20 DIAGNOSIS — E785 Hyperlipidemia, unspecified: Secondary | ICD-10-CM | POA: Diagnosis present

## 2017-07-20 DIAGNOSIS — N3000 Acute cystitis without hematuria: Secondary | ICD-10-CM | POA: Diagnosis present

## 2017-07-20 DIAGNOSIS — H2511 Age-related nuclear cataract, right eye: Secondary | ICD-10-CM | POA: Diagnosis present

## 2017-07-20 DIAGNOSIS — R52 Pain, unspecified: Secondary | ICD-10-CM | POA: Diagnosis present

## 2017-07-20 DIAGNOSIS — Z888 Allergy status to other drugs, medicaments and biological substances status: Secondary | ICD-10-CM

## 2017-07-20 DIAGNOSIS — Z79890 Hormone replacement therapy (postmenopausal): Secondary | ICD-10-CM

## 2017-07-20 DIAGNOSIS — I10 Essential (primary) hypertension: Secondary | ICD-10-CM | POA: Diagnosis present

## 2017-07-20 DIAGNOSIS — Z90710 Acquired absence of both cervix and uterus: Secondary | ICD-10-CM

## 2017-07-20 LAB — BLOOD CULTURE ID PANEL (REFLEXED)
Acinetobacter baumannii: NOT DETECTED
CANDIDA GLABRATA: NOT DETECTED
Candida albicans: NOT DETECTED
Candida krusei: NOT DETECTED
Candida parapsilosis: NOT DETECTED
Candida tropicalis: NOT DETECTED
Carbapenem resistance: NOT DETECTED
ENTEROBACTER CLOACAE COMPLEX: NOT DETECTED
ENTEROCOCCUS SPECIES: NOT DETECTED
Enterobacteriaceae species: DETECTED — AB
Escherichia coli: DETECTED — AB
Haemophilus influenzae: NOT DETECTED
Klebsiella oxytoca: NOT DETECTED
Klebsiella pneumoniae: NOT DETECTED
LISTERIA MONOCYTOGENES: NOT DETECTED
NEISSERIA MENINGITIDIS: NOT DETECTED
Proteus species: NOT DETECTED
Pseudomonas aeruginosa: NOT DETECTED
SERRATIA MARCESCENS: NOT DETECTED
STAPHYLOCOCCUS SPECIES: NOT DETECTED
STREPTOCOCCUS AGALACTIAE: NOT DETECTED
STREPTOCOCCUS PNEUMONIAE: NOT DETECTED
Staphylococcus aureus (BCID): NOT DETECTED
Streptococcus pyogenes: NOT DETECTED
Streptococcus species: NOT DETECTED

## 2017-07-20 LAB — INFLUENZA PANEL BY PCR (TYPE A & B)
INFLAPCR: NEGATIVE
INFLBPCR: NEGATIVE

## 2017-07-20 LAB — COMPREHENSIVE METABOLIC PANEL
ALT: 33 U/L (ref 14–54)
AST: 45 U/L — AB (ref 15–41)
Albumin: 3.1 g/dL — ABNORMAL LOW (ref 3.5–5.0)
Alkaline Phosphatase: 57 U/L (ref 38–126)
Anion gap: 9 (ref 5–15)
BILIRUBIN TOTAL: 0.9 mg/dL (ref 0.3–1.2)
BUN: 47 mg/dL — AB (ref 6–20)
CHLORIDE: 109 mmol/L (ref 101–111)
CO2: 23 mmol/L (ref 22–32)
CREATININE: 2.45 mg/dL — AB (ref 0.44–1.00)
Calcium: 8.8 mg/dL — ABNORMAL LOW (ref 8.9–10.3)
GFR calc Af Amer: 20 mL/min — ABNORMAL LOW (ref >60)
GFR, EST NON AFRICAN AMERICAN: 17 mL/min — AB (ref >60)
Glucose, Bld: 126 mg/dL — ABNORMAL HIGH (ref 65–99)
Potassium: 4.1 mmol/L (ref 3.5–5.1)
Sodium: 141 mmol/L (ref 135–145)
TOTAL PROTEIN: 6.3 g/dL — AB (ref 6.5–8.1)

## 2017-07-20 LAB — URINALYSIS, COMPLETE (UACMP) WITH MICROSCOPIC
BILIRUBIN URINE: NEGATIVE
Glucose, UA: NEGATIVE mg/dL
Ketones, ur: NEGATIVE mg/dL
Nitrite: POSITIVE — AB
Protein, ur: 100 mg/dL — AB
SPECIFIC GRAVITY, URINE: 1.012 (ref 1.005–1.030)
pH: 5 (ref 5.0–8.0)

## 2017-07-20 LAB — CBC
HCT: 31.4 % — ABNORMAL LOW (ref 35.0–47.0)
HEMOGLOBIN: 10.9 g/dL — AB (ref 12.0–16.0)
MCH: 33.4 pg (ref 26.0–34.0)
MCHC: 34.7 g/dL (ref 32.0–36.0)
MCV: 96.5 fL (ref 80.0–100.0)
Platelets: 110 10*3/uL — ABNORMAL LOW (ref 150–440)
RBC: 3.26 MIL/uL — AB (ref 3.80–5.20)
RDW: 13.2 % (ref 11.5–14.5)
WBC: 8.3 10*3/uL (ref 3.6–11.0)

## 2017-07-20 MED ORDER — PNEUMOCOCCAL VAC POLYVALENT 25 MCG/0.5ML IJ INJ: 0.5000 mL | INJECTION | INTRAMUSCULAR | Status: DC

## 2017-07-20 MED ORDER — ASPIRIN EC 81 MG PO TBEC
81.0000 mg | DELAYED_RELEASE_TABLET | Freq: Every day | ORAL | Status: DC
  Administered 2017-07-22 – 2017-07-24 (×3): 81 mg via ORAL
  Filled 2017-07-20 (×4): qty 1

## 2017-07-20 MED ORDER — HEPARIN SODIUM (PORCINE) 5000 UNIT/ML IJ SOLN
5000.0000 [IU] | Freq: Three times a day (TID) | INTRAMUSCULAR | Status: DC
  Administered 2017-07-20 – 2017-07-24 (×11): 5000 [IU] via SUBCUTANEOUS
  Filled 2017-07-20 (×11): qty 1

## 2017-07-20 MED ORDER — ADULT MULTIVITAMIN W/MINERALS CH
1.0000 | ORAL_TABLET | Freq: Every day | ORAL | Status: DC
  Administered 2017-07-22 – 2017-07-24 (×3): 1 via ORAL
  Filled 2017-07-20 (×4): qty 1

## 2017-07-20 MED ORDER — IRON-VITAMIN C 65-125 MG PO TABS: 1.0000 | ORAL_TABLET | Freq: Every day | ORAL | Status: DC

## 2017-07-20 MED ORDER — ONDANSETRON HCL 4 MG PO TABS: 4.0000 mg | ORAL_TABLET | Freq: Four times a day (QID) | ORAL | Status: DC | PRN

## 2017-07-20 MED ORDER — AMIODARONE HCL 200 MG PO TABS
200.0000 mg | ORAL_TABLET | Freq: Every day | ORAL | Status: DC
  Administered 2017-07-22 – 2017-07-24 (×3): 200 mg via ORAL
  Filled 2017-07-20 (×4): qty 1

## 2017-07-20 MED ORDER — SODIUM CHLORIDE 0.9 % IV SOLN
1.0000 g | INTRAVENOUS | Status: DC
  Filled 2017-07-20: qty 10

## 2017-07-20 MED ORDER — ACETAMINOPHEN 325 MG PO TABS
975.0000 mg | ORAL_TABLET | Freq: Once | ORAL | Status: AC
  Administered 2017-07-20: 975 mg via ORAL
  Filled 2017-07-20: qty 3

## 2017-07-20 MED ORDER — POLYMYXIN B-TRIMETHOPRIM 10000-0.1 UNIT/ML-% OP SOLN
1.0000 [drp] | Freq: Four times a day (QID) | OPHTHALMIC | Status: DC
  Filled 2017-07-20: qty 10

## 2017-07-20 MED ORDER — ONDANSETRON HCL 4 MG/2ML IJ SOLN
4.0000 mg | Freq: Four times a day (QID) | INTRAMUSCULAR | Status: DC | PRN
Start: 2017-07-20 — End: 2017-07-24
  Administered 2017-07-20: 4 mg via INTRAVENOUS
  Filled 2017-07-20: qty 2

## 2017-07-20 MED ORDER — VITAMIN D 1000 UNITS PO TABS
5000.0000 [IU] | ORAL_TABLET | Freq: Every day | ORAL | Status: DC
  Administered 2017-07-22 – 2017-07-24 (×3): 5000 [IU] via ORAL
  Filled 2017-07-20 (×4): qty 5

## 2017-07-20 MED ORDER — LEVOTHYROXINE SODIUM 50 MCG PO TABS
150.0000 ug | ORAL_TABLET | Freq: Every day | ORAL | Status: DC
  Administered 2017-07-22 – 2017-07-24 (×3): 150 ug via ORAL
  Filled 2017-07-20 (×4): qty 3

## 2017-07-20 MED ORDER — SODIUM CHLORIDE 0.9 % IV SOLN
1.0000 g | Freq: Once | INTRAVENOUS | Status: AC
  Administered 2017-07-20: 1 g via INTRAVENOUS
  Filled 2017-07-20: qty 10

## 2017-07-20 MED ORDER — NEPAFENAC 0.3 % OP SUSP
1.0000 [drp] | Freq: Every day | OPHTHALMIC | Status: DC
  Filled 2017-07-20: qty 3

## 2017-07-20 MED ORDER — SODIUM CHLORIDE 0.9 % IV SOLN
1.0000 g | Freq: Three times a day (TID) | INTRAVENOUS | Status: DC
  Administered 2017-07-20 – 2017-07-21 (×3): 1 g via INTRAVENOUS
  Filled 2017-07-20 (×5): qty 1

## 2017-07-20 MED ORDER — DOCUSATE SODIUM 100 MG PO CAPS
100.0000 mg | ORAL_CAPSULE | Freq: Two times a day (BID) | ORAL | Status: DC | PRN
Start: 2017-07-20 — End: 2017-07-24

## 2017-07-20 MED ORDER — OXYCODONE-ACETAMINOPHEN 5-325 MG PO TABS
1.0000 | ORAL_TABLET | Freq: Four times a day (QID) | ORAL | Status: DC | PRN
  Administered 2017-07-20: 18:00:00 1 via ORAL
  Filled 2017-07-20: qty 1

## 2017-07-20 MED ORDER — ACETAMINOPHEN 325 MG PO TABS
975.0000 mg | ORAL_TABLET | Freq: Once | ORAL | Status: AC
  Administered 2017-07-20: 975 mg via ORAL

## 2017-07-20 MED ORDER — DIFLUPREDNATE 0.05 % OP EMUL
1.0000 [drp] | Freq: Two times a day (BID) | OPHTHALMIC | Status: DC
  Administered 2017-07-20 – 2017-07-24 (×8): 1 [drp] via OPHTHALMIC
  Filled 2017-07-20: qty 5

## 2017-07-20 MED ORDER — SERTRALINE HCL 50 MG PO TABS
50.0000 mg | ORAL_TABLET | Freq: Every day | ORAL | Status: DC
  Administered 2017-07-22 – 2017-07-24 (×3): 50 mg via ORAL
  Filled 2017-07-20 (×4): qty 1

## 2017-07-20 MED ORDER — ROSUVASTATIN CALCIUM 10 MG PO TABS
10.0000 mg | ORAL_TABLET | Freq: Every day | ORAL | Status: DC
  Administered 2017-07-22 – 2017-07-24 (×3): 10 mg via ORAL
  Filled 2017-07-20 (×4): qty 1

## 2017-07-20 MED ORDER — ACETAMINOPHEN 325 MG PO TABS
ORAL_TABLET | ORAL | Status: AC
  Filled 2017-07-20: qty 3

## 2017-07-20 MED ORDER — PANTOPRAZOLE SODIUM 40 MG PO TBEC
40.0000 mg | DELAYED_RELEASE_TABLET | Freq: Every day | ORAL | Status: DC
  Administered 2017-07-22 – 2017-07-24 (×3): 40 mg via ORAL
  Filled 2017-07-20 (×4): qty 1

## 2017-07-20 MED ORDER — VITAMIN C 500 MG PO TABS
250.0000 mg | ORAL_TABLET | Freq: Every day | ORAL | Status: DC
  Administered 2017-07-22 – 2017-07-24 (×3): 250 mg via ORAL
  Filled 2017-07-20: qty 0.5
  Filled 2017-07-20: qty 1
  Filled 2017-07-20 (×3): qty 0.5

## 2017-07-20 MED ORDER — CALCIUM CARBONATE-VITAMIN D 500-200 MG-UNIT PO TABS
1.0000 | ORAL_TABLET | Freq: Every day | ORAL | Status: DC
  Administered 2017-07-22 – 2017-07-24 (×3): 1 via ORAL
  Filled 2017-07-20 (×4): qty 1

## 2017-07-20 MED ORDER — SODIUM CHLORIDE 0.9 % IV SOLN: 1.0000 g | INTRAVENOUS | Status: DC

## 2017-07-20 MED ORDER — POLYMYXIN B-TRIMETHOPRIM 10000-0.1 UNIT/ML-% OP SOLN
1.0000 [drp] | Freq: Four times a day (QID) | OPHTHALMIC | Status: DC
  Administered 2017-07-20 – 2017-07-24 (×14): 1 [drp] via OPHTHALMIC
  Filled 2017-07-20: qty 10

## 2017-07-20 MED ORDER — SODIUM CHLORIDE 0.9 % IV SOLN
INTRAVENOUS | Status: DC
  Administered 2017-07-20 – 2017-07-22 (×3): via INTRAVENOUS

## 2017-07-20 MED ORDER — NEPAFENAC 0.3 % OP SUSP
1.0000 [drp] | Freq: Every day | OPHTHALMIC | Status: DC
  Administered 2017-07-20 – 2017-07-23 (×4): 1 [drp] via OPHTHALMIC
  Filled 2017-07-20 (×2): qty 3

## 2017-07-20 MED ORDER — FERROUS SULFATE 325 (65 FE) MG PO TABS
325.0000 mg | ORAL_TABLET | Freq: Every day | ORAL | Status: DC
  Administered 2017-07-22 – 2017-07-24 (×3): 325 mg via ORAL
  Filled 2017-07-20 (×4): qty 1

## 2017-07-20 MED ORDER — SODIUM CHLORIDE 0.9 % IV BOLUS
500.0000 mL | Freq: Once | INTRAVENOUS | Status: AC
  Administered 2017-07-20: 500 mL via INTRAVENOUS

## 2017-07-20 MED ORDER — ENSURE ENLIVE PO LIQD
237.0000 mL | Freq: Three times a day (TID) | ORAL | Status: DC
  Administered 2017-07-21 – 2017-07-24 (×9): 237 mL via ORAL

## 2017-07-20 MED ORDER — SODIUM CHLORIDE 0.9 % IV BOLUS
1000.0000 mL | Freq: Once | INTRAVENOUS | Status: AC
  Administered 2017-07-20: 1000 mL via INTRAVENOUS

## 2017-07-20 MED ORDER — ACETAMINOPHEN 325 MG PO TABS
650.0000 mg | ORAL_TABLET | Freq: Four times a day (QID) | ORAL | Status: DC | PRN
  Administered 2017-07-21 – 2017-07-23 (×2): 650 mg via ORAL
  Filled 2017-07-20 (×3): qty 2

## 2017-07-20 MED ORDER — DIFLUPREDNATE 0.05 % OP EMUL
1.0000 [drp] | Freq: Two times a day (BID) | OPHTHALMIC | Status: DC
  Filled 2017-07-20: qty 5

## 2017-07-20 MED ORDER — DOCUSATE SODIUM 100 MG PO CAPS: 100.0000 mg | ORAL_CAPSULE | Freq: Every day | ORAL | Status: DC | PRN

## 2017-07-20 NOTE — ED Notes (Signed)
ED Provider at bedside. 

## 2017-07-20 NOTE — ED Provider Notes (Signed)
[facility] Emergency Department Provider Note ________________   First MD Initiated Contact with Patient 07/20/17 [phone]     (approximate)  I have reviewed the triage vital signs and the nursing notes.   HISTORY  Chief Complaint Generalized Body Aches    HPI [name] is a 84 y.o. female with below list of chronic medical conditions presents to the emergency department with generalized body aches, fever, dysuria urinary urgency and frequency times 3 days.  Patient states temperature at home was greater than 101.  Patient denies any cough.  Patient denies any back pain   Past Medical History:  Diagnosis Date  . Arthritis    "back, hands" (09/08/2015)  . Atrial fibrillation ([place])    Echocardiogram 5/12: normal LV function, moderate LAE, mild RAE, mild AI and MR and mild-to-moderate TR.  Myoview 5/ 12 showed an ejection fraction of 73% and normal perfusion.   Patient underwent elective DCCV on 08/26/10  . Breast cancer ([place]) 2007, 2013   right breast- 2007 radiation-mastectomy  . Cancer of right breast ([place])   . Chronic lower back pain   . Complication of anesthesia    "took me about 1 week to know what was going on after one of my knee ORs"  . COPD (chronic obstructive pulmonary disease) ([place])   . DDD (degenerative disc disease), lumbar   . Depression   . 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 Dyspnea    DOE  . Dysrhythmia   . GERD (gastroesophageal reflux disease)   . Hyperlipidemia   . Hypertension   . Hypothyroid   . PAD (peripheral artery disease) ([place])   . PVD (peripheral vascular disease) [facility])     Patient Active Problem List   Diagnosis Date Noted  . Orthostatic hypotension 01/14/2016  . ARF (acute renal failure) ([place]) 01/14/2016  . Dehydration 01/14/2016  . Diarrhea 01/14/2016  . Pressure injury of skin 01/12/2016  . Protein-calorie malnutrition, severe 01/12/2016  . Ductal carcinoma in situ (DCIS) of right  breast 09/01/2015  . PAD (peripheral artery disease) ([place]) 08/10/2014  . Claudication ([place]) 06/30/2014  . Chronic diastolic congestive heart failure ([place]) 02/26/2014  . Edema 07/28/2013  . Preop cardiovascular exam 07/28/2013  . Renal insufficiency 09/14/2011  . PAF (paroxysmal atrial fibrillation) ([place]) 08/09/2010  . Hypertension 08/09/2010  . Chest pain 08/09/2010    Past Surgical History:  Procedure Laterality Date  . ABDOMINAL EXPLORATION SURGERY     "had to go back in after hysterectomy & check on stitch dr had put near my bladder; don't know if they took it out; had to wear catheter for 1 month"  . ABDOMINAL HYSTERECTOMY    . APPENDECTOMY    . BACK SURGERY    . BALLOON ANGIOPLASTY, ARTERY Right 09/08/2015   superficial femoral  . BREAST EXCISIONAL BIOPSY Right 2007   positive  . BREAST SURGERY    . CARDIOVERSION  11/10/2011   Procedure: CARDIOVERSION;  Surgeon: Lelon Perla, MD;  Location: [facility];  Service: Cardiovascular;  Laterality: N/A;  . CARDIOVERSION  08/26/2010  . CATARACT EXTRACTION W/PHACO Right 07/17/2017   Procedure: CATARACT EXTRACTION PHACO AND INTRAOCULAR LENS PLACEMENT (IOC);  Surgeon: Birder Robson, MD;  Location: [facility] ORS;  Service: Ophthalmology;  Laterality: Right;  [place] 00:44.0 AP% 14.6 CDE 6.44 FLUID PACK LOT # [id number]  . COLONOSCOPY WITH PROPOFOL N/A 01/04/2016   Procedure: COLONOSCOPY WITH PROPOFOL;  Surgeon: Lollie Sails, MD;  [facility];  Service: Endoscopy;  Laterality: N/A;  . HALLUX VALGUS CORRECTION    . JOINT REPLACEMENT    . LUMBAR DISC SURGERY    . MASTECTOMY Right 2013   positive  . PERIPHERAL VASCULAR CATHETERIZATION N/A 09/08/2015   Procedure: Abdominal Aortogram w/Lower Extremity;  Surgeon: Wellington Hampshire, MD;  Location: [facility];  Service: Cardiovascular;  Laterality: N/A;  . PERIPHERAL VASCULAR CATHETERIZATION  09/08/2015   Procedure: Peripheral Vascular Balloon Angioplasty;  Surgeon: Wellington Hampshire,  MD;  Location: [place];  Service: Cardiovascular;;  . REVISION TOTAL KNEE ARTHROPLASTY Right   . rt.ankle ulcer surgery  2013  . TEMPOROMANDIBULAR JOINT ARTHROPLASTY    . TMJ ARTHROPLASTY    . TONSILLECTOMY    . TOTAL KNEE ARTHROPLASTY Right   . VASCULAR SURGERY      Prior to Admission medications   Medication Sig Start Date End Date Taking? Authorizing Provider  acetaminophen (TYLENOL) 500 MG tablet Take 1,000 mg by mouth every 6 (six) hours as needed for moderate pain or headache.     [provider]  amiodarone (PACERONE) 200 MG tablet Take 1 tablet (200 mg total) by mouth daily. 08/04/13   Lelon Perla, MD  aspirin EC 81 MG tablet Take 1 tablet (81 mg total) by mouth daily. 02/26/14   Lelon Perla, MD  calcium-vitamin D (OSCAL WITH D) 500-200 MG-UNIT per tablet Take 1 tablet by mouth daily with breakfast.    [provider]  Cholecalciferol (VITAMIN D3) 5000 units CAPS Take 5,000 Units by mouth daily.     [provider]  docusate sodium (COLACE) 100 MG capsule Take 100 mg by mouth daily as needed for mild constipation.    [provider]  feeding supplement, ENSURE ENLIVE, (ENSURE ENLIVE) LIQD Take 237 mLs by mouth 3 (three) times daily between meals. Patient not taking: Reported on 07/10/2017 01/14/16   Epifanio Lesches, MD  hydrALAZINE (APRESOLINE) 25 MG tablet Take 1 tablet (25 mg total) by mouth every 8 (eight) hours. Patient not taking: Reported on 07/10/2017 01/14/16   Epifanio Lesches, MD  Iron-Vitamin C 65-125 MG TABS Take 1 tablet by mouth daily.    [provider]  levothyroxine (SYNTHROID, LEVOTHROID) 150 MCG tablet Take 150 mcg by mouth daily before breakfast.    [provider]  lisinopril (PRINIVIL,ZESTRIL) 10 MG tablet Take 10 mg by mouth daily. 08/15/14   [provider]  metaxalone (SKELAXIN) 800 MG tablet Take 1 tablet (800 mg total) by mouth 3 (three) times daily. Patient not  taking: Reported on 07/10/2017 01/14/16   Epifanio Lesches, MD  Multiple Vitamin (MULTIVITAMIN) tablet Take 1 tablet by mouth daily.      [provider]  pantoprazole (PROTONIX) 40 MG tablet Take 1 tablet (40 mg total) by mouth daily. 10/01/15   Wellington Hampshire, MD  potassium chloride (K-DUR) 10 MEQ tablet Take 20 mEq by mouth daily.     [provider]  rosuvastatin (CRESTOR) 10 MG tablet Take 1 tablet (10 mg total) by mouth daily. 02/08/17 07/10/17  Wellington Hampshire, MD  sertraline (ZOLOFT) 50 MG tablet Take 50 mg by mouth daily.    [provider]  torsemide (DEMADEX) 20 MG tablet TAKE 1 TABLET (20 MG TOTAL) BY MOUTH ONCE DAILY. 07/24/16   [provider]    Allergies Other; Amlodipine; Ciprofloxacin; Doxycycline; Lipitor [atorvastatin]; Morphine and related; Macrobid [nitrofurantoin macrocrystal]; Nitrofurantoin; Penicillins; Sulfa antibiotics; and Sulfamethoxazole-trimethoprim  Family History  Problem Relation Age of Onset  . Heart attack Father   . Coronary artery disease Sister 42       MI  . Breast cancer Maternal Aunt     Social History Social History   Tobacco Use  . Smoking status: Never Smoker  . Smokeless tobacco: Never Used  Substance Use Topics  . Alcohol use: No  . Drug use: No    Review of Systems Constitutional: Positive for fever Eyes: No visual changes. ENT: No sore throat. Cardiovascular: Denies chest pain. Respiratory: Denies shortness of breath. Gastrointestinal: No abdominal pain.  No nausea, no vomiting.  No diarrhea.  No constipation. Genitourinary: Positive for dysuria urinary frequency and urgency. Musculoskeletal: Negative for neck pain.  Negative for back pain.  For generalized muscle aches Integumentary: Negative for rash. Neurological: Negative for headaches, focal weakness or numbness.   ____________________________________________   PHYSICAL EXAM:  VITAL SIGNS: ED Triage Vitals  Enc Vitals Group      BP 07/20/17 0545 (!) 136/49     Pulse Rate 07/20/17 0545 88     Resp 07/20/17 0545 17     Temp 07/20/17 0545 100.3 F (37.9 C)     Temp Source 07/20/17 0545 Oral     SpO2 07/20/17 0545 96 %     Weight 07/20/17 0531 56.7 kg (125 lb)     Height 07/20/17 0531 1.727 m (5\' 8" )     Head Circumference --      Peak Flow --      Pain Score 07/20/17 0531 2     Pain Loc --      Pain Edu? --      Excl. in [place]? --     Constitutional: Alert and oriented. Well appearing and in no acute distress. Eyes: Conjunctivae are normal. Head: Atraumatic. Mouth/Throat: Mucous membranes are moist.  Oropharynx non-erythematous. Neck: No stridor.   Cardiovascular: Normal rate, regular rhythm. Good peripheral circulation. Grossly normal heart sounds. Respiratory: Normal respiratory effort.  No retractions. Lungs CTAB. Gastrointestinal: Suprapubic tenderness to palpation.. No distention.  Musculoskeletal: No lower extremity tenderness nor edema. No gross deformities of extremities. Neurologic:  Normal speech and language. No gross focal neurologic deficits are appreciated.  Skin:  Skin is warm, dry and intact. No rash noted. Psychiatric: Mood and affect are normal. Speech and behavior are normal.  ____________________________________________   LABS (all labs ordered are listed, but only abnormal results are displayed)  Labs Reviewed  CBC - Abnormal; Notable for the following components:      Result Value   RBC 3.26 (*)    Hemoglobin 10.9 (*)    HCT 31.4 (*)    Platelets 110 (*)    All other components within normal limits  COMPREHENSIVE METABOLIC PANEL - Abnormal; Notable for the following components:   Glucose, Bld 126 (*)    BUN 47 (*)    Creatinine, Ser 2.45 (*)    Calcium 8.8 (*)    Total Protein 6.3 (*)    Albumin 3.1 (*)    AST 45 (*)    GFR calc non Af Amer 17 (*)    GFR calc Af Amer 20 (*)    All other components within normal limits  CULTURE, BLOOD (ROUTINE X 2)  CULTURE, BLOOD  (ROUTINE X 2)  URINE CULTURE  URINALYSIS, COMPLETE (UACMP) WITH MICROSCOPIC  INFLUENZA PANEL BY PCR (TYPE A & B)   ____________________________________________  EKG  ED ECG REPORT I, [place] Adeline Petitfrere, the attending physician, personally viewed  and interpreted this ECG.   Date: 07/20/2017  EKG Time:   Rate: 90  Rhythm: Normal sinus rhythm  Axis: Normal  Intervals: Normal  ST&T Change: None     Procedures   ____________________________________________   INITIAL IMPRESSION / ASSESSMENT AND PLAN / ED COURSE  As part of my medical decision making, I reviewed the following data within the electronic MEDICAL RECORD NUMBER   84 year old female presenting to the emergency department with above-stated history and physical exam with concern for possible urinary tract infection.  Laboratory data obtained including urinalysis and urine culture as well as blood cultures.  Patient given IV ceftriaxone in the emergency department.  Laboratory data consistent with renal insufficiency as well as urinary tract infection.  Patient discussed with hospitalist for admission for further evaluation and management _____________________________________  FINAL CLINICAL IMPRESSION(S) / ED DIAGNOSES  Final diagnoses:  Acute cystitis without hematuria  Acute renal insufficiency     MEDICATIONS GIVEN DURING THIS VISIT:  Medications  sodium chloride 0.9 % bolus 500 mL (500 mLs Intravenous New Bag/Given 07/20/17 0612)  cefTRIAXone (ROCEPHIN) 1 g in sodium chloride 0.9 % 100 mL IVPB (1 g Intravenous New Bag/Given 07/20/17 0609)     ED Discharge Orders    None       Note:  This document was prepared using Dragon voice recognition software and may include unintentional dictation errors.    Gregor Hams, MD 07/20/17 2237

## 2017-07-20 NOTE — H&P (Signed)
[facility] NAME: [name]    MR#:  [id number]  DATE OF BIRTH:  09-15-1932  DATE OF ADMISSION:  07/20/2017  PRIMARY CARE PHYSICIAN: Rusty Aus, MD   REQUESTING/REFERRING PHYSICIAN: Mariea Clonts  CHIEF COMPLAINT:   Chief Complaint  Patient presents with  . Generalized Body Aches    HISTORY OF PRESENT ILLNESS: [name]  is a 84 y.o. female with a known history of COPD, Breast cancer, A fib, Htn, HLD, PAD- lives at home with husband- walks mostly independently. Had fever, chills and low back pain for last 1-2 days. Also had nausea. Noted to have UTI and Ac renal failure in ER.  PAST MEDICAL HISTORY:   Past Medical History:  Diagnosis Date  . Arthritis    "back, hands" (09/08/2015)  . Atrial fibrillation ([place])    Echocardiogram 5/12: normal LV function, moderate LAE, mild RAE, mild AI and MR and mild-to-moderate TR.  Myoview 5/ 12 showed an ejection fraction of 73% and normal perfusion.   Patient underwent elective DCCV on 08/26/10  . Breast cancer ([place]) 2007, 2013   right breast- 2007 radiation-mastectomy  . Cancer of right breast ([place])   . Chronic lower back pain   . Complication of anesthesia    "took me about 1 week to know what was going on after one of my knee ORs"  . COPD (chronic obstructive pulmonary disease) ([place])   . DDD (degenerative disc disease), lumbar   . Depression   . 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 Dyspnea    DOE  . Dysrhythmia   . GERD (gastroesophageal reflux disease)   . Hyperlipidemia   . Hypertension   . Hypothyroid   . PAD (peripheral artery disease) ([place])   . PVD (peripheral vascular disease) ([place])     PAST SURGICAL HISTORY:  Past Surgical History:  Procedure Laterality Date  . ABDOMINAL EXPLORATION SURGERY     "had to go back in after hysterectomy & check on stitch dr had put near my bladder; don't know if they took it out; had to wear catheter for 1 month"  .  ABDOMINAL HYSTERECTOMY    . APPENDECTOMY    . BACK SURGERY    . BALLOON ANGIOPLASTY, ARTERY Right 09/08/2015   superficial femoral  . BREAST EXCISIONAL BIOPSY Right 2007   positive  . BREAST SURGERY    . CARDIOVERSION  11/10/2011   Procedure: CARDIOVERSION;  Surgeon: Lelon Perla, MD;  Location: [facility];  Service: Cardiovascular;  Laterality: N/A;  . CARDIOVERSION  08/26/2010  . CATARACT EXTRACTION W/PHACO Right 07/17/2017   Procedure: CATARACT EXTRACTION PHACO AND INTRAOCULAR LENS PLACEMENT (IOC);  Surgeon: Birder Robson, MD;  Location: [facility];  Service: Ophthalmology;  Laterality: Right;  [place] 00:44.0 AP% 14.6 CDE 6.44 FLUID PACK LOT # [id number] H  . COLONOSCOPY WITH PROPOFOL N/A 01/04/2016   Procedure: COLONOSCOPY WITH PROPOFOL;  Surgeon: Lollie Sails, MD;  Location: [facility] ENDOSCOPY;  Service: Endoscopy;  Laterality: N/A;  . HALLUX VALGUS CORRECTION    . JOINT REPLACEMENT    . LUMBAR DISC SURGERY    . MASTECTOMY Right 2013   positive  . PERIPHERAL VASCULAR CATHETERIZATION N/A 09/08/2015   Procedure: Abdominal Aortogram w/Lower Extremity;  Surgeon: Wellington Hampshire, MD;  Location: [facility];  Service: Cardiovascular;  Laterality: N/A;  . PERIPHERAL VASCULAR CATHETERIZATION  09/08/2015   Procedure: Peripheral Vascular Balloon Angioplasty;  Surgeon: Wellington Hampshire,  MD;  Location: [facility];  Service: Cardiovascular;;  . REVISION TOTAL KNEE ARTHROPLASTY Right   . rt.ankle ulcer surgery  2013  . TEMPOROMANDIBULAR JOINT ARTHROPLASTY    . TMJ ARTHROPLASTY    . TONSILLECTOMY    . TOTAL KNEE ARTHROPLASTY Right   . VASCULAR SURGERY      SOCIAL HISTORY:  Social History   Tobacco Use  . Smoking status: Never Smoker  . Smokeless tobacco: Never Used  Substance Use Topics  . Alcohol use: No    FAMILY HISTORY:  Family History  Problem Relation Age of Onset  . Heart attack Father   . Coronary artery disease Sister 55       MI  . Breast cancer Maternal Aunt      DRUG ALLERGIES:  Allergies  Allergen Reactions  . Other Other (See Comments)    NO BP, VENIPUNCTURE, OR ACCESS IN RIGHT ARM - S/P MASTECTOMY ON RIGHT  . Amlodipine Nausea And Vomiting  . Ciprofloxacin Nausea Only  . Doxycycline Itching and Other (See Comments)    shaky  . Lipitor [Atorvastatin] Other (See Comments)    Joint pain   . Morphine And Related Nausea And Vomiting  . Macrobid [Nitrofurantoin Macrocrystal] Rash  . Nitrofurantoin Rash  . Penicillins Rash and Other (See Comments)    Has patient had a PCN reaction causing immediate rash, facial/tongue/throat swelling, SOB or lightheadedness with hypotension: yes Has patient had a PCN reaction causing severe rash involving mucus membranes or skin necrosis: no Has patient had a PCN reaction that required hospitalization no Has patient had a PCN reaction occurring within the last 10 years: no If all of the above answers are "NO", then may proceed with Cephalosporin use.   . Sulfa Antibiotics Rash  . Sulfamethoxazole-Trimethoprim Rash    REVIEW OF SYSTEMS:   CONSTITUTIONAL: No fever,positive for fatigue or weakness.  EYES: No blurred or double vision.  EARS, NOSE, AND THROAT: No tinnitus or ear pain.  RESPIRATORY: No cough, shortness of breath, wheezing or hemoptysis.  CARDIOVASCULAR: No chest pain, orthopnea, edema.  GASTROINTESTINAL: No nausea, vomiting, diarrhea or abdominal pain.  GENITOURINARY: No dysuria, hematuria.  ENDOCRINE: No polyuria, nocturia,  HEMATOLOGY: No anemia, easy bruising or bleeding SKIN: No rash or lesion. MUSCULOSKELETAL: No joint pain or arthritis.   NEUROLOGIC: No tingling, numbness, weakness.  PSYCHIATRY: No anxiety or depression.   MEDICATIONS AT HOME:  Prior to Admission medications   Medication Sig Start Date End Date Taking? Authorizing Provider  acetaminophen (TYLENOL) 500 MG tablet Take 1,000 mg by mouth every 6 (six) hours as needed for moderate pain or headache.    Yes [provider]  amiodarone (PACERONE) 200 MG tablet Take 1 tablet (200 mg total) by mouth daily. 08/04/13  Yes Lelon Perla, MD  aspirin EC 81 MG tablet Take 1 tablet (81 mg total) by mouth daily. 02/26/14  Yes Lelon Perla, MD  calcium-vitamin D (OSCAL WITH D) 500-200 MG-UNIT per tablet Take 1 tablet by mouth daily with breakfast.   Yes [provider]  Cholecalciferol (VITAMIN D3) 5000 units CAPS Take 5,000 Units by mouth daily.    Yes [provider]  docusate sodium (COLACE) 100 MG capsule Take 100 mg by mouth daily as needed for mild constipation.   Yes [provider]  Iron-Vitamin C 65-125 MG TABS Take 1 tablet by mouth daily.   Yes [provider]  levothyroxine (SYNTHROID, LEVOTHROID) 150 MCG tablet Take 150 mcg by  mouth daily before breakfast.   Yes [provider]  lisinopril (PRINIVIL,ZESTRIL) 10 MG tablet Take 10 mg by mouth daily. 08/15/14  Yes [provider]  Multiple Vitamin (MULTIVITAMIN) tablet Take 1 tablet by mouth daily.     Yes [provider]  potassium chloride (K-DUR) 10 MEQ tablet Take 10 mEq by mouth 4 (four) times daily.    Yes [provider]  rosuvastatin (CRESTOR) 10 MG tablet Take 1 tablet (10 mg total) by mouth daily. 02/08/17 07/20/17 Yes Wellington Hampshire, MD  sertraline (ZOLOFT) 50 MG tablet Take 50 mg by mouth daily.   Yes [provider]  spironolactone (ALDACTONE) 25 MG tablet Take 25 mg by mouth daily.   Yes [provider]  torsemide (DEMADEX) 20 MG tablet TAKE 1 TABLET (20 MG TOTAL) BY MOUTH ONCE DAILY. 07/24/16  Yes [provider]  feeding supplement, ENSURE ENLIVE, (ENSURE ENLIVE) LIQD Take 237 mLs by mouth 3 (three) times daily between meals. Patient not taking: Reported on 07/10/2017 01/14/16   Epifanio Lesches, MD  hydrALAZINE (APRESOLINE) 25 MG tablet Take 1 tablet (25 mg total) by mouth every 8 (eight) hours. Patient not taking: Reported on  07/10/2017 01/14/16   Epifanio Lesches, MD  metaxalone (SKELAXIN) 800 MG tablet Take 1 tablet (800 mg total) by mouth 3 (three) times daily. Patient not taking: Reported on 07/10/2017 01/14/16   Epifanio Lesches, MD  pantoprazole (PROTONIX) 40 MG tablet Take 1 tablet (40 mg total) by mouth daily. 10/01/15   Wellington Hampshire, MD      PHYSICAL EXAMINATION:   VITAL SIGNS: Blood pressure (!) 128/54, pulse 87, temperature 100.3 F (37.9 C), temperature source Oral, resp. rate 18, height 5\' 8"  (1.727 m), weight 56.7 kg (125 lb), SpO2 97 %.  GENERAL:  84 y.o.-year-old patient lying in the bed with no acute distress.  EYES: Pupils equal, round, reactive to light and accommodation. No scleral icterus. Extraocular muscles intact.  HEENT: Head atraumatic, normocephalic. Oropharynx and nasopharynx clear.  NECK:  Supple, no jugular venous distention. No thyroid enlargement, no tenderness.  LUNGS: Normal breath sounds bilaterally, no wheezing, rales,rhonchi or crepitation. No use of accessory muscles of respiration.  CARDIOVASCULAR: S1, S2 normal. No murmurs, rubs, or gallops.  ABDOMEN: Soft, nontender, nondistended. Bowel sounds present. No organomegaly or mass. B/l CV angle tender. EXTREMITIES: No pedal edema, cyanosis, or clubbing.  NEUROLOGIC: Cranial nerves II through XII are intact. Muscle strength 5/5 in all extremities. Sensation intact. Gait not checked.  PSYCHIATRIC: The patient is alert and oriented x 3.  SKIN: No obvious rash, lesion, or ulcer.   LABORATORY PANEL:   CBC Recent Labs  Lab 07/20/17 0556  WBC 8.3  HGB 10.9*  HCT 31.4*  PLT 110*  MCV 96.5  MCH 33.4  MCHC 34.7  RDW 13.2   ------------------------------------------------------------------------------------------------------------------  Chemistries  Recent Labs  Lab 07/20/17 0556  NA 141  K 4.1  CL 109  CO2 23  GLUCOSE 126*  BUN 47*  CREATININE 2.45*  CALCIUM 8.8*  AST 45*  ALT 33  ALKPHOS 57   BILITOT 0.9   ------------------------------------------------------------------------------------------------------------------ estimated creatinine clearance is 15.8 mL/min (A) (by C-G formula based on SCr of 2.45 mg/dL (H)). ------------------------------------------------------------------------------------------------------------------ No results for input(s): TSH, T4TOTAL, T3FREE, THYROIDAB in the last 72 hours.  Invalid input(s): FREET3   Coagulation profile No results for input(s): INR, PROTIME in the last 168 hours. ------------------------------------------------------------------------------------------------------------------- No results for input(s): DDIMER in the last 72 hours. -------------------------------------------------------------------------------------------------------------------  Cardiac Enzymes  No results for input(s): CKMB, TROPONINI, MYOGLOBIN in the last 168 hours.  Invalid input(s): CK ------------------------------------------------------------------------------------------------------------------ Invalid input(s): POCBNP  ---------------------------------------------------------------------------------------------------------------  Urinalysis    Component Value Date/Time   COLORURINE AMBER (A) 07/20/2017 0556   APPEARANCEUR CLOUDY (A) 07/20/2017 0556   APPEARANCEUR Clear 08/29/2013 1027   LABSPEC 1.012 07/20/2017 0556   LABSPEC 1.005 08/29/2013 1027   PHURINE 5.0 07/20/2017 0556   GLUCOSEU NEGATIVE 07/20/2017 0556   GLUCOSEU Negative 08/29/2013 1027   HGBUR MODERATE (A) 07/20/2017 0556   BILIRUBINUR NEGATIVE 07/20/2017 0556   BILIRUBINUR Negative 08/29/2013 1027   KETONESUR NEGATIVE 07/20/2017 0556   PROTEINUR 100 (A) 07/20/2017 0556   NITRITE POSITIVE (A) 07/20/2017 0556   LEUKOCYTESUR LARGE (A) 07/20/2017 0556   LEUKOCYTESUR Negative 08/29/2013 1027     RADIOLOGY: No results found.  EKG: Orders placed or performed during the  hospital encounter of 07/20/17  . ED EKG  . ED EKG    IMPRESSION AND PLAN:  * Ac renal failure   IV fluids, Hold diuretics, Monitor  * UTI   Ur cx, Rocephin  * Htn   Hold meds with renal failure and BP normal.  * A fib   Cont amio+ ASA.  * Hypothyroidism   Cont levothyroxine.  PT eval.  All the records are reviewed and case discussed with ED provider. Management plans discussed with the patient, family and they are in agreement.  CODE STATUS: DNR Code Status History    Date Active Date Inactive Code Status Order ID Comments User Context   01/12/2016 0612 01/12/2016 0734 Full Code [id number]  Harrie Foreman, MD Inpatient   08/19/2014 0940 08/19/2014 1908 Full Code [id number]  Wellington Hampshire, MD Inpatient     Husband in room.  TOTAL TIME TAKING CARE OF THIS PATIENT: 50 minutes.    Vaughan Basta M.D on 07/20/2017   Between 7am to 6pm - Pager - [phone]  After 6pm go to [email or web address] - password [email or web address] [facility]  Office  [phone]  CC: Primary care physician; Rusty Aus, MD   Note: This dictation was prepared with Dragon dictation along with smaller phrase technology. Any transcriptional errors that result from this process are unintentional.

## 2017-07-20 NOTE — Progress Notes (Signed)
Pharmacy Antibiotic Note  [name] is a 84 y.o. female admitted on 07/20/2017 with Ecoli bacteremia. Patient presented to ED with complaints of generalized body aches, fever, chills, and nausea. Further workup revealed E coli (KPC not detected) bacteremia. Patient had been started on ceftriaxone for suspected UTI. Pharmacy has been consulted for Meropenem dosing.    Plan: Will discontinue ceftriaxone and start meropenem 1g IV Q8H. Pharmacy will continue to follow.   Height: 5\' 8"  (172.7 cm) Weight: 141 lb 1 oz (64 kg) IBW/kg (Calculated) : 63.9  Temp (24hrs), Avg:100.7 F (38.2 C), Min:100.3 F (37.9 C), Max:101 F (38.3 C)  Recent Labs  Lab 07/20/17 0556  WBC 8.3  CREATININE 2.45*    Estimated Creatinine Clearance: 17.9 mL/min (A) (by C-G formula based on SCr of 2.45 mg/dL (H)).    Allergies  Allergen Reactions  . Other Other (See Comments)    NO BP, VENIPUNCTURE, OR ACCESS IN RIGHT ARM - S/P MASTECTOMY ON RIGHT  . Amlodipine Nausea And Vomiting  . Ciprofloxacin Nausea Only  . Doxycycline Itching and Other (See Comments)    shaky  . Lipitor [Atorvastatin] Other (See Comments)    Joint pain   . Morphine And Related Nausea And Vomiting  . Macrobid [Nitrofurantoin Macrocrystal] Rash  . Nitrofurantoin Rash  . Penicillins Rash and Other (See Comments)    Has patient had a PCN reaction causing immediate rash, facial/tongue/throat swelling, SOB or lightheadedness with hypotension: yes Has patient had a PCN reaction causing severe rash involving mucus membranes or skin necrosis: no Has patient had a PCN reaction that required hospitalization no Has patient had a PCN reaction occurring within the last 10 years: no If all of the above answers are "NO", then may proceed with Cephalosporin use.   . Sulfa Antibiotics Rash  . Sulfamethoxazole-Trimethoprim Rash    Antimicrobials this admission: Ceftriaxone 4/5 >> x 1 dose Meropenem 4/5 >>  Dose adjustments this  admission:   Microbiology results: BCx 4/5 >> E coli - KPC not detected UCx 4/5 >> sent   Thank you for allowing pharmacy to be a part of this patient's care.  Lendon Ka, PharmD Pharmacy Resident 07/20/2017 7:19 PM

## 2017-07-20 NOTE — Progress Notes (Signed)
PHARMACY - PHYSICIAN COMMUNICATION CRITICAL VALUE ALERT - BLOOD CULTURE IDENTIFICATION (BCID)  [name] is an 84 y.o. female who presented to [facility] on 07/20/2017 with a chief complaint of generalized body aches, fever, chills, and nausea.   Assessment:  Sepsis secondary to UTI  Name of physician (or Provider) Contacted: Dr. Fritzi Mandes  Current antibiotics: Ceftriaxone 1g Q24H  Changes to prescribed antibiotics recommended: Will stop ceftriaxone and initiate merropenem 1g IV Q8H. Patient has reported allergy to PCN but was confirmed through nurse that it is only a rash.   Ecoli KPC not detected 4 out of 4 bottles.  Recommendations accepted by provider  Results for orders placed or performed during the hospital encounter of 07/20/17  Blood Culture ID Panel (Reflexed) (Collected: 07/20/2017  6:04 AM)  Result Value Ref Range   Enterococcus species NOT DETECTED NOT DETECTED   Listeria monocytogenes NOT DETECTED NOT DETECTED   Staphylococcus species NOT DETECTED NOT DETECTED   Staphylococcus aureus NOT DETECTED NOT DETECTED   Streptococcus species NOT DETECTED NOT DETECTED   Streptococcus agalactiae NOT DETECTED NOT DETECTED   Streptococcus pneumoniae NOT DETECTED NOT DETECTED   Streptococcus pyogenes NOT DETECTED NOT DETECTED   Acinetobacter baumannii NOT DETECTED NOT DETECTED   Enterobacteriaceae species DETECTED (A) NOT DETECTED   Enterobacter cloacae complex NOT DETECTED NOT DETECTED   Escherichia coli DETECTED (A) NOT DETECTED   Klebsiella oxytoca NOT DETECTED NOT DETECTED   Klebsiella pneumoniae NOT DETECTED NOT DETECTED   Proteus species NOT DETECTED NOT DETECTED   Serratia marcescens NOT DETECTED NOT DETECTED   Carbapenem resistance NOT DETECTED NOT DETECTED   Haemophilus influenzae NOT DETECTED NOT DETECTED   Neisseria meningitidis NOT DETECTED NOT DETECTED   Pseudomonas aeruginosa NOT DETECTED NOT DETECTED   Candida albicans NOT DETECTED NOT DETECTED   Candida  glabrata NOT DETECTED NOT DETECTED   Candida krusei NOT DETECTED NOT DETECTED   Candida parapsilosis NOT DETECTED NOT DETECTED   Candida tropicalis NOT DETECTED NOT DETECTED    Lendon Ka, PharmD Pharmacy Resident 07/20/2017  7:16 PM

## 2017-07-20 NOTE — Progress Notes (Signed)
Family Meeting Note  Advance Directive:yes  Today a meeting took place with the Patient and spouse.  The following clinical team members were present during this meeting:MD  The following were discussed:Patient's diagnosis: UTI, Ac renal failure. , Patient's progosis: Unable to determine and Goals for treatment: DNR  Additional follow-up to be provided: PMD  Time spent during discussion:20 minutes  Vaughan Basta, MD

## 2017-07-20 NOTE — ED Triage Notes (Signed)
Pt to triage via w/c with no distress noted, mask in place; cataract surgery on Tues; since Wed having body aches and fever

## 2017-07-21 LAB — BASIC METABOLIC PANEL
Anion gap: 6 (ref 5–15)
BUN: 37 mg/dL — AB (ref 6–20)
CALCIUM: 8.4 mg/dL — AB (ref 8.9–10.3)
CO2: 23 mmol/L (ref 22–32)
CREATININE: 2.05 mg/dL — AB (ref 0.44–1.00)
Chloride: 112 mmol/L — ABNORMAL HIGH (ref 101–111)
GFR calc Af Amer: 25 mL/min — ABNORMAL LOW (ref >60)
GFR calc non Af Amer: 21 mL/min — ABNORMAL LOW (ref >60)
Glucose, Bld: 103 mg/dL — ABNORMAL HIGH (ref 65–99)
Potassium: 4.2 mmol/L (ref 3.5–5.1)
SODIUM: 141 mmol/L (ref 135–145)

## 2017-07-21 LAB — CBC
HCT: 30.9 % — ABNORMAL LOW (ref 35.0–47.0)
Hemoglobin: 10.2 g/dL — ABNORMAL LOW (ref 12.0–16.0)
MCH: 32.8 pg (ref 26.0–34.0)
MCHC: 33.2 g/dL (ref 32.0–36.0)
MCV: 99 fL (ref 80.0–100.0)
PLATELETS: 100 10*3/uL — AB (ref 150–440)
RBC: 3.12 MIL/uL — ABNORMAL LOW (ref 3.80–5.20)
RDW: 13.3 % (ref 11.5–14.5)
WBC: 13.9 10*3/uL — AB (ref 3.6–11.0)

## 2017-07-21 LAB — URINE CULTURE

## 2017-07-21 MED ORDER — PNEUMOCOCCAL VAC POLYVALENT 25 MCG/0.5ML IJ INJ
0.5000 mL | INJECTION | INTRAMUSCULAR | Status: AC
  Administered 2017-07-24: 0.5 mL via INTRAMUSCULAR
  Filled 2017-07-21: qty 0.5

## 2017-07-21 MED ORDER — SODIUM CHLORIDE 0.9 % IV SOLN
1.0000 g | Freq: Two times a day (BID) | INTRAVENOUS | Status: DC
  Administered 2017-07-21 – 2017-07-22 (×2): 1 g via INTRAVENOUS
  Filled 2017-07-21 (×3): qty 1

## 2017-07-21 NOTE — Progress Notes (Signed)
[place] at [facility]                                                                                                                                                                                  Patient Demographics   [name], is a 84 y.o. female, DOB - 06-09-1932, OIZ:[id number]  Admit date - 07/20/2017   Admitting Physician Vaughan Basta, MD  Outpatient Primary MD for the patient is Rusty Aus, MD   LOS - 1  Subjective: Patient complains of feeling very cold    Review of Systems:   CONSTITUTIONAL: No documented fever. No fatigue, weakness. No weight gain, no weight loss.  EYES: No blurry or double vision.  ENT: No tinnitus. No postnasal drip. No redness of the oropharynx.  RESPIRATORY: No cough, no wheeze, no hemoptysis. No dyspnea.  CARDIOVASCULAR: No chest pain. No orthopnea. No palpitations. No syncope.  GASTROINTESTINAL: No nausea, no vomiting or diarrhea. No abdominal pain. No melena or hematochezia.  GENITOURINARY: No dysuria or hematuria.  ENDOCRINE: No polyuria or nocturia. No heat or cold intolerance.  HEMATOLOGY: No anemia. No bruising. No bleeding.  INTEGUMENTARY: No rashes. No lesions.  MUSCULOSKELETAL: No arthritis. No swelling. No gout.  NEUROLOGIC: No numbness, tingling, or ataxia. No seizure-type activity.  PSYCHIATRIC: No anxiety. No insomnia. No ADD.    Vitals:   Vitals:   07/20/17 1729 07/20/17 2043 07/21/17 0412 07/21/17 1202  BP:  (!) 108/50 134/60 133/60  Pulse:  79 72 74  Resp:  16 17 20   Temp:  99.7 F (37.6 C) 98.4 F (36.9 C) 98.7 F (37.1 C)  TempSrc:  Oral Oral Oral  SpO2:  95% 96% 93%  Weight: 141 lb 1 oz (64 kg)     Height: 5\' 8"  (1.727 m)       Wt Readings from Last 3 Encounters:  07/20/17 141 lb 1 oz (64 kg)  07/10/17 126 lb (57.2 kg)  02/08/17 134 lb 4 oz (60.9 kg)     Intake/Output Summary (Last 24 hours) at 07/21/2017 1257 Last data filed at 07/21/2017 1048 Gross per 24 hour   Intake 1726.25 ml  Output -  Net 1726.25 ml    Physical Exam:   GENERAL: Pleasant-appearing in no apparent distress.  HEAD, EYES, EARS, NOSE AND THROAT: Atraumatic, normocephalic. Extraocular muscles are intact. Pupils equal and reactive to light. Sclerae anicteric. No conjunctival injection. No oro-pharyngeal erythema.  NECK: Supple. There is no jugular venous distention. No bruits, no lymphadenopathy, no thyromegaly.  HEART: Regular rate and rhythm,. No murmurs, no rubs, no clicks.  LUNGS: Clear to auscultation bilaterally. No rales or rhonchi. No wheezes.  ABDOMEN: Soft,  flat, nontender, nondistended. Has good bowel sounds. No hepatosplenomegaly appreciated.  EXTREMITIES: No evidence of any cyanosis, clubbing, or peripheral edema.  +2 pedal and radial pulses bilaterally.  NEUROLOGIC: The patient is alert, awake, and oriented x3 with no focal motor or sensory deficits appreciated bilaterally.  SKIN: Moist and warm with no rashes appreciated.  Psych: Not anxious, depressed LN: No inguinal LN enlargement    Antibiotics   Anti-infectives (From admission, onward)   Start     Dose/Rate Route Frequency Ordered Stop   07/21/17 2200  meropenem (MERREM) 1 g in sodium chloride 0.9 % 100 mL IVPB     1 g 200 mL/hr over 30 Minutes Intravenous Every 12 hours 07/21/17 1028     07/21/17 1000  cefTRIAXone (ROCEPHIN) 1 g in sodium chloride 0.9 % 100 mL IVPB  Status:  Discontinued     1 g 200 mL/hr over 30 Minutes Intravenous Every 24 hours 07/20/17 1624 07/20/17 1820   07/21/17 0000  cefTRIAXone (ROCEPHIN) 1 g in sodium chloride 0.9 % 100 mL IVPB  Status:  Discontinued     1 g 200 mL/hr over 30 Minutes Intravenous Every 24 hours 07/20/17 0945 07/20/17 1624   07/20/17 1830  meropenem (MERREM) 1 g in sodium chloride 0.9 % 100 mL IVPB  Status:  Discontinued     1 g 200 mL/hr over 30 Minutes Intravenous Every 8 hours 07/20/17 1820 07/21/17 1028   07/20/17 0615  cefTRIAXone (ROCEPHIN) 1 g in sodium  chloride 0.9 % 100 mL IVPB     1 g 200 mL/hr over 30 Minutes Intravenous  Once 07/20/17 0604 07/20/17 0719      Medications   Scheduled Meds: . amiodarone  200 mg Oral Daily  . aspirin EC  81 mg Oral Daily  . calcium-vitamin D  1 tablet Oral Q breakfast  . cholecalciferol  5,000 Units Oral Daily  . Difluprednate  1 drop Ophthalmic BID  . feeding supplement (ENSURE ENLIVE)  237 mL Oral TID BM  . ferrous sulfate  325 mg Oral Q breakfast   And  . vitamin C  250 mg Oral Q breakfast  . heparin  5,000 Units Subcutaneous Q8H  . levothyroxine  150 mcg Oral QAC breakfast  . multivitamin with minerals  1 tablet Oral Daily  . nepafenac  1 drop Right Eye QHS  . pantoprazole  40 mg Oral Daily  . pneumococcal 23 valent vaccine  0.5 mL Intramuscular Tomorrow-1000  . rosuvastatin  10 mg Oral Daily  . sertraline  50 mg Oral Daily  . trimethoprim-polymyxin b  1 drop Right Eye QID   Continuous Infusions: . sodium chloride 75 mL/hr at 07/20/17 0951  . meropenem (MERREM) IV     PRN Meds:.acetaminophen, docusate sodium, ondansetron (ZOFRAN) IV, ondansetron, oxyCODONE-acetaminophen   Data Review:   Micro Results Recent Results (from the past 240 hour(s))  Blood culture (routine x 2)     Status: None (Preliminary result)   Collection Time: 07/20/17  6:04 AM  Result Value Ref Range Status   Specimen Description   Final    BLOOD LEFT ARM Performed at [facility], [address], [place], [place] [address]    Special Requests   Final    BOTTLES DRAWN AEROBIC AND ANAEROBIC Blood Culture adequate volume Performed at [facility], [address], [place], [place] [address]    Culture  Setup Time   Final    GRAM NEGATIVE RODS IN BOTH AEROBIC AND ANAEROBIC BOTTLES  CRITICAL RESULT CALLED TO, READ BACK BY AND VERIFIED WITH: Lorelle Formosa 07/20/17 @ [place] Performed at [facility], [place] [address] [place], [place] [address]    Culture GRAM NEGATIVE RODS  Final    Report Status PENDING  Incomplete  Blood culture (routine x 2)     Status: None (Preliminary result)   Collection Time: 07/20/17  6:04 AM  Result Value Ref Range Status   Specimen Description   Final    BLOOD LEFT ARM Performed at [facility], [address], [place], [place] [address]    Special Requests   Final    BOTTLES DRAWN AEROBIC AND ANAEROBIC Blood Culture adequate volume Performed at [facility], [address] [place], [place] [address]    Culture  Setup Time   Final    GRAM NEGATIVE RODS IN BOTH AEROBIC AND ANAEROBIC BOTTLES CRITICAL RESULT CALLED TO, READ BACK BY AND VERIFIED WITH: Lorelle Formosa 07/20/17 @ [place] Performed at [facility], [address] [place], [place] [address]    Culture   Final    Lonell Grandchild NEGATIVE RODS CULTURE REINCUBATED FOR BETTER GROWTH Performed at [facility], [place] [address] [place], [place] [address]    Report Status PENDING  Incomplete  Urine culture     Status: Abnormal   Collection Time: 07/20/17  6:04 AM  Result Value Ref Range Status   Specimen Description   Final    URINE, RANDOM Performed at [facility], [address] [place], [place] [address]    Special Requests   Final    NONE Performed at [facility], [place] [place],  [address]    Culture MULTIPLE SPECIES PRESENT, SUGGEST RECOLLECTION (A)  Final   Report Status 07/21/2017 FINAL  Final  Blood Culture ID Panel (Reflexed)     Status: Abnormal   Collection Time: 07/20/17  6:04 AM  Result Value Ref Range Status   Enterococcus species NOT DETECTED NOT DETECTED Final   Listeria monocytogenes NOT DETECTED NOT DETECTED Final   Staphylococcus species NOT DETECTED NOT DETECTED Final   Staphylococcus aureus NOT DETECTED NOT DETECTED Final   Streptococcus species NOT DETECTED NOT DETECTED Final   Streptococcus agalactiae NOT DETECTED NOT DETECTED Final   Streptococcus pneumoniae NOT DETECTED NOT  DETECTED Final   Streptococcus pyogenes NOT DETECTED NOT DETECTED Final   Acinetobacter baumannii NOT DETECTED NOT DETECTED Final   Enterobacteriaceae species DETECTED (A) NOT DETECTED Final    Comment: Enterobacteriaceae represent a large family of gram-negative bacteria, not a single organism. CRITICAL RESULT CALLED TO, READ BACK BY AND VERIFIED WITH: HANNAH LISSEY 07/20/17 @ [place]    Enterobacter cloacae complex NOT DETECTED NOT DETECTED Final   Escherichia coli DETECTED (A) NOT DETECTED Final    Comment: CRITICAL RESULT CALLED TO, READ BACK BY AND VERIFIED WITH: HANNAH LISSEY 07/20/17 @ 1801  MLK    Klebsiella oxytoca NOT DETECTED NOT DETECTED Final   Klebsiella pneumoniae NOT DETECTED NOT DETECTED Final   Proteus species NOT DETECTED NOT DETECTED Final   Serratia marcescens NOT DETECTED NOT DETECTED Final   Carbapenem resistance NOT DETECTED NOT DETECTED Final   Haemophilus influenzae NOT DETECTED NOT DETECTED Final   Neisseria meningitidis NOT DETECTED NOT DETECTED Final   Pseudomonas aeruginosa NOT DETECTED NOT DETECTED Final   Candida albicans NOT DETECTED NOT DETECTED Final   Candida glabrata NOT DETECTED NOT DETECTED Final   Candida krusei NOT  DETECTED NOT DETECTED Final   Candida parapsilosis NOT DETECTED NOT DETECTED Final   Candida tropicalis NOT DETECTED NOT DETECTED Final    Comment: Performed at [facility], [address], [place], [place] [address]    Radiology Reports No results found.   CBC Recent Labs  Lab 07/20/17 0556 07/21/17 0356  WBC 8.3 13.9*  HGB 10.9* 10.2*  HCT 31.4* 30.9*  PLT 110* 100*  MCV 96.5 99.0  MCH 33.4 32.8  MCHC 34.7 33.2  RDW 13.2 13.3    Chemistries  Recent Labs  Lab 07/20/17 0556 07/21/17 0356  NA 141 141  K 4.1 4.2  CL 109 112*  CO2 23 23  GLUCOSE 126* 103*  BUN 47* 37*  CREATININE 2.45* 2.05*  CALCIUM 8.8* 8.4*  AST 45*  --   ALT 33  --   ALKPHOS 57  --   BILITOT 0.9  --     ------------------------------------------------------------------------------------------------------------------ estimated creatinine clearance is 21.3 mL/min (A) (by C-G formula based on SCr of 2.05 mg/dL (H)). ------------------------------------------------------------------------------------------------------------------ No results for input(s): HGBA1C in the last 72 hours. ------------------------------------------------------------------------------------------------------------------ No results for input(s): CHOL, HDL, LDLCALC, TRIG, CHOLHDL, LDLDIRECT in the last 72 hours. ------------------------------------------------------------------------------------------------------------------ No results for input(s): TSH, T4TOTAL, T3FREE, THYROIDAB in the last 72 hours.  Invalid input(s): FREET3 ------------------------------------------------------------------------------------------------------------------ No results for input(s): VITAMINB12, FOLATE, FERRITIN, TIBC, IRON, RETICCTPCT in the last 72 hours.  Coagulation profile No results for input(s): INR, PROTIME in the last 168 hours.  No results for input(s): DDIMER in the last 72 hours.  Cardiac Enzymes No results for input(s): CKMB, TROPONINI, MYOGLOBIN in the last 168 hours.  Invalid input(s): CK ------------------------------------------------------------------------------------------------------------------ Invalid input(s): POCBNP    Assessment & Plan   IMPRESSION AND PLAN:  * Ac on chronic renal failure stage III   Improved with IV fluids diuretics on hold repeat renal function in the morning  * UTI with positive blood culture   Continue meropenem await final urine culture  * Htn Blood pressure stable continue to monitor off blood pressure medication  * A fib   Cont amio+ ASA.  * Hypothyroidism   Cont levothyroxine.        Code Status Orders  (From admission, onward)        Start      Ordered   07/20/17 1725  Do not attempt resuscitation (DNR)  Continuous    Question Answer Comment  In the event of cardiac or respiratory ARREST Do not call a "code blue"   In the event of cardiac or respiratory ARREST Do not perform Intubation, CPR, defibrillation or ACLS   In the event of cardiac or respiratory ARREST Use medication by any route, position, wound care, and other measures to relive pain and suffering. May use oxygen, suction and manual treatment of airway obstruction as needed for comfort.      07/20/17 1724    Code Status History    Date Active Date Inactive Code Status Order ID Comments User Context   01/12/2016 0612 01/12/2016 0734 Full Code [id number]  Harrie Foreman, MD Inpatient   08/19/2014 0940 08/19/2014 1908 Full Code [id number]  Wellington Hampshire, MD Inpatient    Advance Directive Documentation     Most Recent Value  Type of Advance Directive  Healthcare Power of Attorney, Living will, Out of facility DNR (pink MOST or yellow form)  Pre-existing out of facility DNR order (yellow form or pink MOST form)  Yellow form placed in chart (order not valid for  inpatient use)  "MOST" Form in Place?  -           Consults none  DVT Prophylaxis  Lovenox   Lab Results  Component Value Date   PLT 100 (L) 07/21/2017     Time Spent in minutes 35 minutes greater than 50% of time spent in care coordination and counseling patient regarding the condition and plan of care.   Dustin Flock M.D on 07/21/2017 at 12:57 PM  Between 7am to 6pm - Pager - [phone]  After 6pm go to [email or web address] - Proofreader  [employer]   Office  [phone]

## 2017-07-21 NOTE — Progress Notes (Signed)
Pharmacy Antibiotic Note  [name] is a 84 y.o. female admitted on 07/20/2017 with Ecoli bacteremia. Patient presented to ED with complaints of generalized body aches, fever, chills, and nausea. Further workup revealed E coli (KPC not detected) bacteremia. Patient had been started on ceftriaxone for suspected UTI. Pharmacy has been consulted for Meropenem dosing.    Plan: Start meropenem 1g IV Q12H based on current renal function <73ml/min. Pharmacy will continue to follow.   Height: 5\' 8"  (172.7 cm) Weight: 141 lb 1 oz (64 kg) IBW/kg (Calculated) : 63.9  Temp (24hrs), Avg:99.7 F (37.6 C), Min:98.4 F (36.9 C), Max:101 F (38.3 C)  Recent Labs  Lab 07/20/17 0556 07/21/17 0356  WBC 8.3 13.9*  CREATININE 2.45* 2.05*    Estimated Creatinine Clearance: 21.3 mL/min (A) (by C-G formula based on SCr of 2.05 mg/dL (H)).    Allergies  Allergen Reactions  . Other Other (See Comments)    NO BP, VENIPUNCTURE, OR ACCESS IN RIGHT ARM - S/P MASTECTOMY ON RIGHT  . Amlodipine Nausea And Vomiting  . Ciprofloxacin Nausea Only  . Doxycycline Itching and Other (See Comments)    shaky  . Lipitor [Atorvastatin] Other (See Comments)    Joint pain   . Morphine And Related Nausea And Vomiting  . Macrobid [Nitrofurantoin Macrocrystal] Rash  . Nitrofurantoin Rash  . Penicillins Rash and Other (See Comments)    Has patient had a PCN reaction causing immediate rash, facial/tongue/throat swelling, SOB or lightheadedness with hypotension: yes Has patient had a PCN reaction causing severe rash involving mucus membranes or skin necrosis: no Has patient had a PCN reaction that required hospitalization no Has patient had a PCN reaction occurring within the last 10 years: no If all of the above answers are "NO", then may proceed with Cephalosporin use.   . Sulfa Antibiotics Rash  . Sulfamethoxazole-Trimethoprim Rash    Antimicrobials this admission: Ceftriaxone 4/5 >> x 1 dose Meropenem 4/5  >>  Dose adjustments this admission:   Microbiology results: BCx 4/5 >> E coli - KPC not detected UCx 4/5 >> sent   Thank you for allowing pharmacy to be a part of this patient's care.  Pernell Dupre, PharmD, BCPS Clinical Pharmacist 07/21/2017 10:28 AM

## 2017-07-22 LAB — CBC
HCT: 32.2 % — ABNORMAL LOW (ref 35.0–47.0)
HEMOGLOBIN: 10.8 g/dL — AB (ref 12.0–16.0)
MCH: 32.8 pg (ref 26.0–34.0)
MCHC: 33.5 g/dL (ref 32.0–36.0)
MCV: 97.8 fL (ref 80.0–100.0)
Platelets: 123 10*3/uL — ABNORMAL LOW (ref 150–440)
RBC: 3.3 MIL/uL — AB (ref 3.80–5.20)
RDW: 13.1 % (ref 11.5–14.5)
WBC: 11.8 10*3/uL — ABNORMAL HIGH (ref 3.6–11.0)

## 2017-07-22 LAB — BASIC METABOLIC PANEL
Anion gap: 3 — ABNORMAL LOW (ref 5–15)
BUN: 33 mg/dL — ABNORMAL HIGH (ref 6–20)
CALCIUM: 8.4 mg/dL — AB (ref 8.9–10.3)
CHLORIDE: 114 mmol/L — AB (ref 101–111)
CO2: 22 mmol/L (ref 22–32)
CREATININE: 1.69 mg/dL — AB (ref 0.44–1.00)
GFR, EST AFRICAN AMERICAN: 31 mL/min — AB (ref >60)
GFR, EST NON AFRICAN AMERICAN: 27 mL/min — AB (ref >60)
Glucose, Bld: 94 mg/dL (ref 65–99)
Potassium: 4.1 mmol/L (ref 3.5–5.1)
SODIUM: 139 mmol/L (ref 135–145)

## 2017-07-22 LAB — CULTURE, BLOOD (ROUTINE X 2): Special Requests: ADEQUATE

## 2017-07-22 LAB — GLUCOSE, CAPILLARY: GLUCOSE-CAPILLARY: 88 mg/dL (ref 65–99)

## 2017-07-22 MED ORDER — SODIUM CHLORIDE 0.9 % IV SOLN
1.0000 g | INTRAVENOUS | Status: DC
  Administered 2017-07-22: 15:00:00 1 g via INTRAVENOUS
  Filled 2017-07-22 (×2): qty 10

## 2017-07-22 MED ORDER — RISAQUAD PO CAPS
2.0000 | ORAL_CAPSULE | Freq: Every day | ORAL | Status: DC
  Administered 2017-07-22 – 2017-07-24 (×3): 2 via ORAL
  Filled 2017-07-22 (×3): qty 2

## 2017-07-22 MED ORDER — SODIUM CHLORIDE 0.9 % IV SOLN
2.0000 g | INTRAVENOUS | Status: AC
  Administered 2017-07-23: 15:00:00 2 g via INTRAVENOUS
  Filled 2017-07-22: qty 20

## 2017-07-22 MED ORDER — SODIUM CHLORIDE 0.9 % IV SOLN
1.0000 g | Freq: Once | INTRAVENOUS | Status: AC
  Administered 2017-07-22: 16:00:00 1 g via INTRAVENOUS
  Filled 2017-07-22: qty 10

## 2017-07-22 NOTE — Progress Notes (Addendum)
[place] at [facility]                                                                                                                                                                                  Patient Demographics   [name], is a 84 y.o. female, DOB - 11/16/1932, EXB:[id number]  Admit date - 07/20/2017   Admitting Physician Vaughan Basta, MD  Outpatient Primary MD for the patient is Rusty Aus, MD   LOS - 2  Subjective: Patient feeling better still very weak    Review of Systems:   CONSTITUTIONAL: No documented fever.  Positive fatigue, positive weakness. No weight gain, no weight loss.  EYES: No blurry or double vision.  ENT: No tinnitus. No postnasal drip. No redness of the oropharynx.  RESPIRATORY: No cough, no wheeze, no hemoptysis. No dyspnea.  CARDIOVASCULAR: No chest pain. No orthopnea. No palpitations. No syncope.  GASTROINTESTINAL: No nausea, no vomiting or diarrhea. No abdominal pain. No melena or hematochezia.  GENITOURINARY: No dysuria or hematuria.  ENDOCRINE: No polyuria or nocturia. No heat or cold intolerance.  HEMATOLOGY: No anemia. No bruising. No bleeding.  INTEGUMENTARY: No rashes. No lesions.  MUSCULOSKELETAL: No arthritis. No swelling. No gout.  NEUROLOGIC: No numbness, tingling, or ataxia. No seizure-type activity.  PSYCHIATRIC: No anxiety. No insomnia. No ADD.    Vitals:   Vitals:   07/21/17 2009 07/22/17 0132 07/22/17 0340 07/22/17 0939  BP: (!) 141/65  (!) 163/65 (!) 170/68  Pulse: 76  67 77  Resp: 18  18 18   Temp: 98.5 F (36.9 C) 98.4 F (36.9 C) 98 F (36.7 C) 98.6 F (37 C)  TempSrc: Oral Oral Oral Oral  SpO2: 93%  96% 97%  Weight:      Height:        Wt Readings from Last 3 Encounters:  07/20/17 141 lb 1 oz (64 kg)  07/10/17 126 lb (57.2 kg)  02/08/17 134 lb 4 oz (60.9 kg)     Intake/Output Summary (Last 24 hours) at 07/22/2017 1224 Last data filed at 07/22/2017 0620 Gross per 24 hour   Intake 2050 ml  Output 800 ml  Net 1250 ml    Physical Exam:   GENERAL: Pleasant-appearing in no apparent distress.  HEAD, EYES, EARS, NOSE AND THROAT: Atraumatic, normocephalic. Extraocular muscles are intact. Pupils equal and reactive to light. Sclerae anicteric. No conjunctival injection. No oro-pharyngeal erythema.  NECK: Supple. There is no jugular venous distention. No bruits, no lymphadenopathy, no thyromegaly.  HEART: Regular rate and rhythm,. No murmurs, no rubs, no clicks.  LUNGS: Clear to auscultation bilaterally. No rales or rhonchi. No wheezes.  ABDOMEN: Soft,  flat, nontender, nondistended. Has good bowel sounds. No hepatosplenomegaly appreciated.  EXTREMITIES: No evidence of any cyanosis, clubbing, or peripheral edema.  +2 pedal and radial pulses bilaterally.  NEUROLOGIC: The patient is alert, awake, and oriented x3 with no focal motor or sensory deficits appreciated bilaterally.  SKIN: Moist and warm with no rashes appreciated.  Psych: Not anxious, depressed LN: No inguinal LN enlargement    Antibiotics   Anti-infectives (From admission, onward)   Start     Dose/Rate Route Frequency Ordered Stop   07/21/17 2200  meropenem (MERREM) 1 g in sodium chloride 0.9 % 100 mL IVPB     1 g 200 mL/hr over 30 Minutes Intravenous Every 12 hours 07/21/17 1028     07/21/17 1000  cefTRIAXone (ROCEPHIN) 1 g in sodium chloride 0.9 % 100 mL IVPB  Status:  Discontinued     1 g 200 mL/hr over 30 Minutes Intravenous Every 24 hours 07/20/17 1624 07/20/17 1820   07/21/17 0000  cefTRIAXone (ROCEPHIN) 1 g in sodium chloride 0.9 % 100 mL IVPB  Status:  Discontinued     1 g 200 mL/hr over 30 Minutes Intravenous Every 24 hours 07/20/17 0945 07/20/17 1624   07/20/17 1830  meropenem (MERREM) 1 g in sodium chloride 0.9 % 100 mL IVPB  Status:  Discontinued     1 g 200 mL/hr over 30 Minutes Intravenous Every 8 hours 07/20/17 1820 07/21/17 1028   07/20/17 0615  cefTRIAXone (ROCEPHIN) 1 g in sodium  chloride 0.9 % 100 mL IVPB     1 g 200 mL/hr over 30 Minutes Intravenous  Once 07/20/17 0604 07/20/17 0719      Medications   Scheduled Meds: . acidophilus  2 capsule Oral Daily  . amiodarone  200 mg Oral Daily  . aspirin EC  81 mg Oral Daily  . calcium-vitamin D  1 tablet Oral Q breakfast  . cholecalciferol  5,000 Units Oral Daily  . Difluprednate  1 drop Ophthalmic BID  . feeding supplement (ENSURE ENLIVE)  237 mL Oral TID BM  . ferrous sulfate  325 mg Oral Q breakfast   And  . vitamin C  250 mg Oral Q breakfast  . heparin  5,000 Units Subcutaneous Q8H  . levothyroxine  150 mcg Oral QAC breakfast  . multivitamin with minerals  1 tablet Oral Daily  . nepafenac  1 drop Right Eye QHS  . pantoprazole  40 mg Oral Daily  . pneumococcal 23 valent vaccine  0.5 mL Intramuscular Tomorrow-1000  . pneumococcal 23 valent vaccine  0.5 mL Intramuscular Tomorrow-1000  . rosuvastatin  10 mg Oral Daily  . sertraline  50 mg Oral Daily  . trimethoprim-polymyxin b  1 drop Right Eye QID   Continuous Infusions: . meropenem (MERREM) IV Stopped (07/22/17 1014)   PRN Meds:.acetaminophen, docusate sodium, ondansetron (ZOFRAN) IV, ondansetron, oxyCODONE-acetaminophen   Data Review:   Micro Results Recent Results (from the past 240 hour(s))  Blood culture (routine x 2)     Status: Abnormal   Collection Time: 07/20/17  6:04 AM  Result Value Ref Range Status   Specimen Description   Final    BLOOD LEFT ARM Performed at [facility], [address] [place], [place] [address]    Special Requests   Final    BOTTLES DRAWN AEROBIC AND ANAEROBIC Blood Culture adequate volume Performed at [facility], [address], [place], [place] [address]    Culture  Setup Time   Final  GRAM NEGATIVE RODS IN BOTH AEROBIC AND ANAEROBIC BOTTLES CRITICAL RESULT CALLED TO, READ BACK BY AND VERIFIED WITH: Lorelle Formosa 07/20/17 @ [place] Performed at [facility], [place] [address], [place], [place] [address]    Culture ESCHERICHIA COLI (A)  Final   Report Status 07/22/2017 FINAL  Final   Organism ID, Bacteria ESCHERICHIA COLI  Final      Susceptibility   Escherichia coli - MIC*    AMPICILLIN >=32 RESISTANT Resistant     CEFAZOLIN <=4 SENSITIVE Sensitive     CEFEPIME <=1 SENSITIVE Sensitive     CEFTAZIDIME <=1 SENSITIVE Sensitive     CEFTRIAXONE <=1 SENSITIVE Sensitive     CIPROFLOXACIN <=0.25 SENSITIVE Sensitive     GENTAMICIN <=1 SENSITIVE Sensitive     IMIPENEM <=0.25 SENSITIVE Sensitive     TRIMETH/SULFA <=20 SENSITIVE Sensitive     AMPICILLIN/SULBACTAM >=32 RESISTANT Resistant     PIP/TAZO <=4 SENSITIVE Sensitive     Extended ESBL NEGATIVE Sensitive     * ESCHERICHIA COLI  Blood culture (routine x 2)     Status: None (Preliminary result)   Collection Time: 07/20/17  6:04 AM  Result Value Ref Range Status   Specimen Description   Final    BLOOD LEFT ARM Performed at [facility], [address], [place], [place] [address]    Special Requests   Final    BOTTLES DRAWN AEROBIC AND ANAEROBIC Blood Culture adequate volume Performed at [facility], [address] [place], [place] [address]    Culture  Setup Time   Final    GRAM NEGATIVE RODS IN BOTH AEROBIC AND ANAEROBIC BOTTLES CRITICAL RESULT CALLED TO, READ BACK BY AND VERIFIED WITH: Lorelle Formosa 07/20/17 @ [place] Performed at [facility], [address] [place], [place] [address]    Culture   Final    Lonell Grandchild NEGATIVE RODS IDENTIFICATION TO FOLLOW Performed at [facility], [place] [address], [place], [place] [address]    Report Status PENDING  Incomplete  Urine culture     Status: Abnormal   Collection Time: 07/20/17  6:04 AM  Result Value Ref Range Status   Specimen Description   Final    URINE, RANDOM Performed at [facility], [address] [place], [place] [address]    Special Requests   Final    NONE Performed at [facility],  [address] [place], [place] [address]    Culture MULTIPLE SPECIES PRESENT, SUGGEST RECOLLECTION (A)  Final   Report Status 07/21/2017 FINAL  Final  Blood Culture ID Panel (Reflexed)     Status: Abnormal   Collection Time: 07/20/17  6:04 AM  Result Value Ref Range Status   Enterococcus species NOT DETECTED NOT DETECTED Final   Listeria monocytogenes NOT DETECTED NOT DETECTED Final   Staphylococcus species NOT DETECTED NOT DETECTED Final   Staphylococcus aureus NOT DETECTED NOT DETECTED Final   Streptococcus species NOT DETECTED NOT DETECTED Final   Streptococcus agalactiae NOT DETECTED NOT DETECTED Final   Streptococcus pneumoniae NOT DETECTED NOT DETECTED Final   Streptococcus pyogenes NOT DETECTED NOT DETECTED Final   Acinetobacter baumannii NOT DETECTED NOT DETECTED Final   Enterobacteriaceae species DETECTED (A) NOT DETECTED Final    Comment: Enterobacteriaceae represent a large family of gram-negative bacteria, not a single organism. CRITICAL RESULT CALLED TO, READ BACK BY AND VERIFIED WITH: HANNAH LISSEY 07/20/17 @ 1801  MLK    Enterobacter cloacae complex  NOT DETECTED NOT DETECTED Final   Escherichia coli DETECTED (A) NOT DETECTED Final    Comment: CRITICAL RESULT CALLED TO, READ BACK BY AND VERIFIED WITH: HANNAH LISSEY 07/20/17 @ [employer]    Klebsiella oxytoca NOT DETECTED NOT DETECTED Final   Klebsiella pneumoniae NOT DETECTED NOT DETECTED Final   Proteus species NOT DETECTED NOT DETECTED Final   Serratia marcescens NOT DETECTED NOT DETECTED Final   Carbapenem resistance NOT DETECTED NOT DETECTED Final   Haemophilus influenzae NOT DETECTED NOT DETECTED Final   Neisseria meningitidis NOT DETECTED NOT DETECTED Final   Pseudomonas aeruginosa NOT DETECTED NOT DETECTED Final   Candida albicans NOT DETECTED NOT DETECTED Final   Candida glabrata NOT DETECTED NOT DETECTED Final   Candida krusei NOT DETECTED NOT DETECTED Final   Candida parapsilosis NOT DETECTED NOT DETECTED  Final   Candida tropicalis NOT DETECTED NOT DETECTED Final    Comment: Performed at [facility], [address], [place], [place] [address]    Radiology Reports No results found.   CBC Recent Labs  Lab 07/20/17 0556 07/21/17 0356 07/22/17 0427  WBC 8.3 13.9* 11.8*  HGB 10.9* 10.2* 10.8*  HCT 31.4* 30.9* 32.2*  PLT 110* 100* 123*  MCV 96.5 99.0 97.8  MCH 33.4 32.8 32.8  MCHC 34.7 33.2 33.5  RDW 13.2 13.3 13.1    Chemistries  Recent Labs  Lab 07/20/17 0556 07/21/17 0356 07/22/17 0427  NA 141 141 139  K 4.1 4.2 4.1  CL 109 112* 114*  CO2 23 23 22   GLUCOSE 126* 103* 94  BUN 47* 37* 33*  CREATININE 2.45* 2.05* 1.69*  CALCIUM 8.8* 8.4* 8.4*  AST 45*  --   --   ALT 33  --   --   ALKPHOS 57  --   --   BILITOT 0.9  --   --    ------------------------------------------------------------------------------------------------------------------ estimated creatinine clearance is 25.9 mL/min (A) (by C-G formula based on SCr of 1.69 mg/dL (H)). ------------------------------------------------------------------------------------------------------------------ No results for input(s): HGBA1C in the last 72 hours. ------------------------------------------------------------------------------------------------------------------ No results for input(s): CHOL, HDL, LDLCALC, TRIG, CHOLHDL, LDLDIRECT in the last 72 hours. ------------------------------------------------------------------------------------------------------------------ No results for input(s): TSH, T4TOTAL, T3FREE, THYROIDAB in the last 72 hours.  Invalid input(s): FREET3 ------------------------------------------------------------------------------------------------------------------ No results for input(s): VITAMINB12, FOLATE, FERRITIN, TIBC, IRON, RETICCTPCT in the last 72 hours.  Coagulation profile No results for input(s): INR, PROTIME in the last 168 hours.  No results for input(s): DDIMER in the last  72 hours.  Cardiac Enzymes No results for input(s): CKMB, TROPONINI, MYOGLOBIN in the last 168 hours.  Invalid input(s): CK ------------------------------------------------------------------------------------------------------------------ Invalid input(s): POCBNP    Assessment & Plan   IMPRESSION AND PLAN:  * Ac on chronic renal failure stage III Renal function back to baseline discontinue IV fluids  * UTI with positive blood culture for E. coli   Continue meropenem  Urine cultures recommended to be recollected I will order a urine culture  * Htn Blood pressure stable continue to monitor off blood pressure medication  * A fib   Cont amio+ ASA.  * Hypothyroidism   Cont levothyroxine.        Code Status Orders  (From admission, onward)        Start     Ordered   07/20/17 1725  Do not attempt resuscitation (DNR)  Continuous    Question Answer Comment  In the event of cardiac or respiratory ARREST Do not call a "code blue"   In the  event of cardiac or respiratory ARREST Do not perform Intubation, CPR, defibrillation or ACLS   In the event of cardiac or respiratory ARREST Use medication by any route, position, wound care, and other measures to relive pain and suffering. May use oxygen, suction and manual treatment of airway obstruction as needed for comfort.      07/20/17 1724    Code Status History    Date Active Date Inactive Code Status Order ID Comments User Context   01/12/2016 0612 01/12/2016 0734 Full Code [id number]  Harrie Foreman, MD Inpatient   08/19/2014 0940 08/19/2014 1908 Full Code [id number]  Wellington Hampshire, MD Inpatient    Advance Directive Documentation     Most Recent Value  Type of Advance Directive  Healthcare Power of Standard, Living will, Out of facility DNR (pink MOST or yellow form)  Pre-existing out of facility DNR order (yellow form or pink MOST form)  Yellow form placed in chart (order not valid for inpatient use)  "MOST" Form in  Place?  -           Consults none  DVT Prophylaxis  Lovenox   Lab Results  Component Value Date   PLT 123 (L) 07/22/2017     Time Spent in minutes 35 minutes greater than 50% of time spent in care coordination and counseling patient regarding the condition and plan of care.   Dustin Flock M.D on 07/22/2017 at 12:24 PM  Between 7am to 6pm - Pager - [phone]  After 6pm go to [email or web address] - Proofreader  [employer]   Office  [phone]

## 2017-07-22 NOTE — Plan of Care (Signed)
  Problem: Education: Goal: Knowledge of General Education information will improve Outcome: Progressing   Problem: Activity: Goal: Risk for activity intolerance will decrease Outcome: Progressing   Problem: Nutrition: Goal: Adequate nutrition will be maintained Outcome: Progressing   Problem: Coping: Goal: Level of anxiety will decrease Outcome: Progressing   Problem: Elimination: Goal: Will not experience complications related to bowel motility Outcome: Progressing Goal: Will not experience complications related to urinary retention Outcome: Progressing   Problem: Pain Managment: Goal: General experience of comfort will improve Outcome: Progressing   Problem: Safety: Goal: Ability to remain free from injury will improve Outcome: Progressing   Problem: Skin Integrity: Goal: Risk for impaired skin integrity will decrease Outcome: Progressing

## 2017-07-22 NOTE — Progress Notes (Signed)
Initial Nutrition Assessment  DOCUMENTATION CODES:   Not applicable  INTERVENTION:  Continue Ensure Enlive po TID, each supplement provides 350 kcal and 20 grams of protein. Patient prefers vanilla or strawberry.  Continue daily MVI.  Encouraged patient to eat a good source of protein at each meal. Discussed foods that contain protein available on the menu.  NUTRITION DIAGNOSIS:   Inadequate oral intake related to decreased appetite as evidenced by per patient/family report.  GOAL:   Patient will meet greater than or equal to 90% of their needs  MONITOR:   PO intake, Supplement acceptance, Labs, Weight trends, I & O's  REASON FOR ASSESSMENT:   Malnutrition Screening Tool    ASSESSMENT:   84 year old female with PMHx of depression, HTN, HLD, hypothyroidism, CKD, A-fib, PVD, COPD, GERD, arthritis, chronic lower back pain, arthritis, hx breast cancer s/p right mastectomy and XRT now admitted with AKI on CKD III, UTI.   Met with patient and her husband at bedside. Patient reports she has had a decreased appetite for the past 10 days due to lack of hunger. She is trying to eat but just not able to eat much of her meals. She reports her appetite was good before. She usually eats 3 small meals each day with snacks between meals. Breakfast is usually a biscuit. Lunch may be a sandwich or soup. Dinner is usually small (patient did not provide any further details). She does not like the chocolate Ensure but likes the vanilla or strawberry. She denies any difficulty chewing/swallowing, abdominal pain, or N/V.   Reports her UBW was 155 lbs one year ago. Per chart she has actually been 131-136 lbs the past 2 years. She reports the only time her weight goes up is when she has edema.  Medications reviewed and include: acidophilus 2 capsules daily, Oscal with D 1 tablet daily, vitamin D 5000 units daily, ferrous sulfate 325 mg daily, vitamin C 250 mg daily, levothyroxine, MVI daily,  pantoprazole, ceftriaxone.  Labs reviewed: Chloride 114, BUN 33, Creatinine 1.69, Anion gap 3.  Patient does not meet criteria for malnutrition at this time but is at risk for malnutrition.  NUTRITION - FOCUSED PHYSICAL EXAM:    Most Recent Value  Orbital Region  No depletion  Upper Arm Region  Mild depletion  Thoracic and Lumbar Region  No depletion  Buccal Region  No depletion  Temple Region  Mild depletion  Clavicle Bone Region  Mild depletion  Clavicle and Acromion Bone Region  Mild depletion  Scapular Bone Region  Mild depletion  Dorsal Hand  Moderate depletion  Patellar Region  Moderate depletion  Anterior Thigh Region  Moderate depletion  Posterior Calf Region  Moderate depletion  Edema (RD Assessment)  Mild  Hair  Reviewed  Eyes  Reviewed  Mouth  Reviewed  Skin  Reviewed [ecchymosis]  Nails  Reviewed     Diet Order:  Diet 2 gram sodium Room service appropriate? Yes; Fluid consistency: Thin  EDUCATION NEEDS:   Education needs have been addressed  Skin:  Skin Assessment: Reviewed RN Assessment(ecchymosis)  Last BM:  07/20/2017  Height:   Ht Readings from Last 1 Encounters:  07/20/17 _0  (1.727 m)    Weight:   Wt Readings from Last 1 Encounters:  07/20/17 141 lb 1 oz (64 kg)    Ideal Body Weight:  63.6 kg  BMI:  Body mass index is 21.45 kg/m.  Estimated Nutritional Needs:   Kcal:  1385-1615 (MSJ x 1.2-1.4)  Protein:  70-80 grams (1.1-1.3 grams/kg)  Fluid:  1.6 L/day (25 mL/kg)  Willey Blade, MS, RD, LDN Office: [phone] Pager: [phone] After Hours/Weekend Pager: [phone]

## 2017-07-22 NOTE — Progress Notes (Signed)
PHARMACIST - PHYSICIAN COMMUNICATION  CONCERNING: Antibiotic Dosing   RECOMMENDATION: Patient started on Ceftriaxone 1g IV every 24 hours. Patient is being treated for E. Coli Bacteremia.  Recommended dose is Ceftriaxone 2g IV every 24 hours.   DESCRIPTION: Pharmacy has adjusted dose to ceftriaxone 2g IV every 24 hours.  Will give additional ceftriaxone 1g dose now to equal ceftriaxone 2gm.   Pernell Dupre, PharmD, BCPS Clinical Pharmacist 07/22/2017 3:22 PM

## 2017-07-23 LAB — CULTURE, BLOOD (ROUTINE X 2): SPECIAL REQUESTS: ADEQUATE

## 2017-07-23 LAB — BASIC METABOLIC PANEL
Anion gap: 6 (ref 5–15)
BUN: 32 mg/dL — AB (ref 6–20)
CO2: 21 mmol/L — ABNORMAL LOW (ref 22–32)
CREATININE: 1.52 mg/dL — AB (ref 0.44–1.00)
Calcium: 8.4 mg/dL — ABNORMAL LOW (ref 8.9–10.3)
Chloride: 111 mmol/L (ref 101–111)
GFR calc Af Amer: 36 mL/min — ABNORMAL LOW (ref >60)
GFR calc non Af Amer: 31 mL/min — ABNORMAL LOW (ref >60)
Glucose, Bld: 97 mg/dL (ref 65–99)
POTASSIUM: 4 mmol/L (ref 3.5–5.1)
SODIUM: 138 mmol/L (ref 135–145)

## 2017-07-23 LAB — CBC
HCT: 31.7 % — ABNORMAL LOW (ref 35.0–47.0)
Hemoglobin: 10.6 g/dL — ABNORMAL LOW (ref 12.0–16.0)
MCH: 32.7 pg (ref 26.0–34.0)
MCHC: 33.6 g/dL (ref 32.0–36.0)
MCV: 97.4 fL (ref 80.0–100.0)
PLATELETS: 139 10*3/uL — AB (ref 150–440)
RBC: 3.25 MIL/uL — AB (ref 3.80–5.20)
RDW: 13.2 % (ref 11.5–14.5)
WBC: 8.6 10*3/uL (ref 3.6–11.0)

## 2017-07-23 LAB — URINE CULTURE: CULTURE: NO GROWTH

## 2017-07-23 MED ORDER — CEPHALEXIN 250 MG PO CAPS
250.0000 mg | ORAL_CAPSULE | Freq: Three times a day (TID) | ORAL | Status: DC
  Administered 2017-07-24: 250 mg via ORAL
  Filled 2017-07-23 (×2): qty 1

## 2017-07-23 MED ORDER — LISINOPRIL 10 MG PO TABS
10.0000 mg | ORAL_TABLET | Freq: Every day | ORAL | Status: DC
  Administered 2017-07-23 – 2017-07-24 (×2): 10 mg via ORAL
  Filled 2017-07-23 (×2): qty 1

## 2017-07-23 NOTE — Care Management Important Message (Signed)
Important Message  Patient Details  Name: [name] MRN: [id number] Date of Birth: [birth year]   Medicare Important Message Given:  Yes    [name] 07/23/2017, 3:14 PM

## 2017-07-23 NOTE — Progress Notes (Signed)
[place] [facility]                                                                                                                                                                                  Patient Demographics   [name], is a 84 y.o. female, DOB - [birth year], TIR:[id number]  Admit date - 07/20/2017   Admitting Physician Vaughan Basta, MD  Outpatient Primary MD for the patient is Rusty Aus, MD   LOS - 3  Subjective: Patient feeling little better but still weak    Review of Systems:   CONSTITUTIONAL: No documented fever.  Positive fatigue, positive weakness. No weight gain, no weight loss.  EYES: No blurry or double vision.  ENT: No tinnitus. No postnasal drip. No redness of the oropharynx.  RESPIRATORY: No cough, no wheeze, no hemoptysis. No dyspnea.  CARDIOVASCULAR: No chest pain. No orthopnea. No palpitations. No syncope.  GASTROINTESTINAL: No nausea, no vomiting or diarrhea. No abdominal pain. No melena or hematochezia.  GENITOURINARY: No dysuria or hematuria.  ENDOCRINE: No polyuria or nocturia. No heat or cold intolerance.  HEMATOLOGY: No anemia. No bruising. No bleeding.  INTEGUMENTARY: No rashes. No lesions.  MUSCULOSKELETAL: No arthritis. No swelling. No gout.  NEUROLOGIC: No numbness, tingling, or ataxia. No seizure-type activity.  PSYCHIATRIC: No anxiety. No insomnia. No ADD.    Vitals:   Vitals:   07/23/17 0403 07/23/17 0844 07/23/17 1145 07/23/17 1248  BP: (!) 166/67 (!) 157/60  (!) 146/53  Pulse: 72 71 68 67  Resp: 16 18  20   Temp: 97.7 F (36.5 C) 98.6 F (37 C)  98.3 F (36.8 C)  TempSrc: Oral Oral  Oral  SpO2: 99% 97% 97% 100%  Weight:      Height:        Wt Readings from Last 3 Encounters:  07/20/17 64 kg (141 lb 1 oz)  07/10/17 57.2 kg (126 lb)  02/08/17 60.9 kg (134 lb 4 oz)     Intake/Output Summary (Last 24 hours) at 07/23/2017 1356 Last data filed at 07/23/2017 0903 Gross per 24 hour   Intake 240 ml  Output 1700 ml  Net -1460 ml    Physical Exam:   GENERAL: Pleasant-appearing in no apparent distress.  HEAD, EYES, EARS, NOSE AND THROAT: Atraumatic, normocephalic. Extraocular muscles are intact. Pupils equal and reactive to light. Sclerae anicteric. No conjunctival injection. No oro-pharyngeal erythema.  NECK: Supple. There is no jugular venous distention. No bruits, no lymphadenopathy, no thyromegaly.  HEART: Regular rate and rhythm,. No murmurs, no rubs, no clicks.  LUNGS: Clear to auscultation bilaterally. No rales or rhonchi. No wheezes.  ABDOMEN: Soft, flat, nontender,  nondistended. Has good bowel sounds. No hepatosplenomegaly appreciated.  EXTREMITIES: No evidence of any cyanosis, clubbing, or peripheral edema.  +2 pedal and radial pulses bilaterally.  NEUROLOGIC: The patient is alert, awake, and oriented x3 with no focal motor or sensory deficits appreciated bilaterally.  SKIN: Moist and warm with no rashes appreciated.  Psych: Not anxious, depressed LN: No inguinal LN enlargement    Antibiotics   Anti-infectives (From admission, onward)   Start     Dose/Rate Route Frequency Ordered Stop   07/23/17 1400  cefTRIAXone (ROCEPHIN) 2 g in sodium chloride 0.9 % 100 mL IVPB     2 g 200 mL/hr over 30 Minutes Intravenous Every 24 hours 07/22/17 1512     07/22/17 1530  cefTRIAXone (ROCEPHIN) 1 g in sodium chloride 0.9 % 100 mL IVPB     1 g 200 mL/hr over 30 Minutes Intravenous  Once 07/22/17 1523 07/22/17 1630   07/22/17 1400  cefTRIAXone (ROCEPHIN) 1 g in sodium chloride 0.9 % 100 mL IVPB  Status:  Discontinued     1 g 200 mL/hr over 30 Minutes Intravenous Every 24 hours 07/22/17 1336 07/22/17 1512   07/21/17 2200  meropenem (MERREM) 1 g in sodium chloride 0.9 % 100 mL IVPB  Status:  Discontinued     1 g 200 mL/hr over 30 Minutes Intravenous Every 12 hours 07/21/17 1028 07/22/17 1335   07/21/17 1000  cefTRIAXone (ROCEPHIN) 1 g in sodium chloride 0.9 % 100 mL IVPB   Status:  Discontinued     1 g 200 mL/hr over 30 Minutes Intravenous Every 24 hours 07/20/17 1624 07/20/17 1820   07/21/17 0000  cefTRIAXone (ROCEPHIN) 1 g in sodium chloride 0.9 % 100 mL IVPB  Status:  Discontinued     1 g 200 mL/hr over 30 Minutes Intravenous Every 24 hours 07/20/17 0945 07/20/17 1624   07/20/17 1830  meropenem (MERREM) 1 g in sodium chloride 0.9 % 100 mL IVPB  Status:  Discontinued     1 g 200 mL/hr over 30 Minutes Intravenous Every 8 hours 07/20/17 1820 07/21/17 1028   07/20/17 0615  cefTRIAXone (ROCEPHIN) 1 g in sodium chloride 0.9 % 100 mL IVPB     1 g 200 mL/hr over 30 Minutes Intravenous  Once 07/20/17 0604 07/20/17 0719      Medications   Scheduled Meds: . acidophilus  2 capsule Oral Daily  . amiodarone  200 mg Oral Daily  . aspirin EC  81 mg Oral Daily  . calcium-vitamin D  1 tablet Oral Q breakfast  . cholecalciferol  5,000 Units Oral Daily  . Difluprednate  1 drop Ophthalmic BID  . feeding supplement (ENSURE ENLIVE)  237 mL Oral TID BM  . ferrous sulfate  325 mg Oral Q breakfast   And  . vitamin C  250 mg Oral Q breakfast  . heparin  5,000 Units Subcutaneous Q8H  . levothyroxine  150 mcg Oral QAC breakfast  . lisinopril  10 mg Oral Daily  . multivitamin with minerals  1 tablet Oral Daily  . nepafenac  1 drop Right Eye QHS  . pantoprazole  40 mg Oral Daily  . pneumococcal 23 valent vaccine  0.5 mL Intramuscular Tomorrow-1000  . pneumococcal 23 valent vaccine  0.5 mL Intramuscular Tomorrow-1000  . rosuvastatin  10 mg Oral Daily  . sertraline  50 mg Oral Daily  . trimethoprim-polymyxin b  1 drop Right Eye QID   Continuous Infusions: . cefTRIAXone (ROCEPHIN)  IV  PRN Meds:.acetaminophen, docusate sodium, ondansetron (ZOFRAN) IV, ondansetron, oxyCODONE-acetaminophen   Data Review:   Micro Results Recent Results (from the past 240 hour(s))  Blood culture (routine x 2)     Status: Abnormal   Collection Time: 07/20/17  6:04 AM  Result Value  Ref Range Status   Specimen Description   Final    BLOOD LEFT ARM Performed at [facility], [address], [place], [place] [address]    Special Requests   Final    BOTTLES DRAWN AEROBIC AND ANAEROBIC Blood Culture adequate volume Performed at [facility], [address] [place], [place] [address]    Culture  Setup Time   Final    GRAM NEGATIVE RODS IN BOTH AEROBIC AND ANAEROBIC BOTTLES CRITICAL RESULT CALLED TO, READ BACK BY AND VERIFIED WITH: Lorelle Formosa 07/20/17 @ [place] Performed at [facility], [place] [address], [place], [place] [address]    Culture ESCHERICHIA COLI (A)  Final   Report Status 07/22/2017 FINAL  Final   Organism ID, Bacteria ESCHERICHIA COLI  Final      Susceptibility   Escherichia coli - MIC*    AMPICILLIN >=32 RESISTANT Resistant     CEFAZOLIN <=4 SENSITIVE Sensitive     CEFEPIME <=1 SENSITIVE Sensitive     CEFTAZIDIME <=1 SENSITIVE Sensitive     CEFTRIAXONE <=1 SENSITIVE Sensitive     CIPROFLOXACIN <=0.25 SENSITIVE Sensitive     GENTAMICIN <=1 SENSITIVE Sensitive     IMIPENEM <=0.25 SENSITIVE Sensitive     TRIMETH/SULFA <=20 SENSITIVE Sensitive     AMPICILLIN/SULBACTAM >=32 RESISTANT Resistant     PIP/TAZO <=4 SENSITIVE Sensitive     Extended ESBL NEGATIVE Sensitive     * ESCHERICHIA COLI  Blood culture (routine x 2)     Status: Abnormal   Collection Time: 07/20/17  6:04 AM  Result Value Ref Range Status   Specimen Description   Final    BLOOD LEFT ARM Performed at [facility], [address] [place], [place] [address]    Special Requests   Final    BOTTLES DRAWN AEROBIC AND ANAEROBIC Blood Culture adequate volume Performed at [facility], [address] [place], [place] [address]    Culture  Setup Time   Final    GRAM NEGATIVE RODS IN BOTH AEROBIC AND ANAEROBIC BOTTLES CRITICAL RESULT CALLED TO, READ BACK BY AND VERIFIED WITH: Lorelle Formosa 07/20/17 @ [place] Performed at [facility], [address] [place], [place] [address]    Culture (A)  Final    ESCHERICHIA COLI SUSCEPTIBILITIES PERFORMED ON PREVIOUS CULTURE WITHIN THE LAST 5 DAYS. Performed at [facility], [place] [address], [place], [place] [address]    Report Status 07/23/2017 FINAL  Final  Urine culture     Status: Abnormal   Collection Time: 07/20/17  6:04 AM  Result Value Ref Range Status   Specimen Description   Final    URINE, RANDOM Performed at [facility], [address], [place], [place] [address]    Special Requests   Final    NONE Performed at [facility], [address] [place], [place] [address]    Culture MULTIPLE SPECIES PRESENT, SUGGEST RECOLLECTION (A)  Final   Report Status 07/21/2017 FINAL  Final  Blood Culture ID Panel (Reflexed)     Status: Abnormal   Collection Time: 07/20/17  6:04 AM  Result Value Ref Range Status   Enterococcus species  NOT DETECTED NOT DETECTED Final   Listeria monocytogenes NOT DETECTED NOT DETECTED Final   Staphylococcus species NOT DETECTED NOT DETECTED Final   Staphylococcus aureus NOT DETECTED NOT DETECTED Final   Streptococcus species NOT DETECTED NOT DETECTED Final   Streptococcus agalactiae NOT DETECTED NOT DETECTED Final   Streptococcus pneumoniae NOT DETECTED NOT DETECTED Final   Streptococcus pyogenes NOT DETECTED NOT DETECTED Final   Acinetobacter baumannii NOT DETECTED NOT DETECTED Final   Enterobacteriaceae species DETECTED (A) NOT DETECTED Final    Comment: Enterobacteriaceae represent a large family of gram-negative bacteria, not a single organism. CRITICAL RESULT CALLED TO, READ BACK BY AND VERIFIED WITH: HANNAH LISSEY 07/20/17 @ [place]    Enterobacter cloacae complex NOT DETECTED NOT DETECTED Final   Escherichia coli DETECTED (A) NOT DETECTED Final    Comment: CRITICAL RESULT CALLED TO, READ BACK BY AND VERIFIED WITH: HANNAH LISSEY 07/20/17 @ [place]    Klebsiella oxytoca NOT DETECTED NOT  DETECTED Final   Klebsiella pneumoniae NOT DETECTED NOT DETECTED Final   Proteus species NOT DETECTED NOT DETECTED Final   Serratia marcescens NOT DETECTED NOT DETECTED Final   Carbapenem resistance NOT DETECTED NOT DETECTED Final   Haemophilus influenzae NOT DETECTED NOT DETECTED Final   Neisseria meningitidis NOT DETECTED NOT DETECTED Final   Pseudomonas aeruginosa NOT DETECTED NOT DETECTED Final   Candida albicans NOT DETECTED NOT DETECTED Final   Candida glabrata NOT DETECTED NOT DETECTED Final   Candida krusei NOT DETECTED NOT DETECTED Final   Candida parapsilosis NOT DETECTED NOT DETECTED Final   Candida tropicalis NOT DETECTED NOT DETECTED Final    Comment: Performed at [facility], [address], [place], [place] [address]    Radiology Reports No results found.   CBC Recent Labs  Lab 07/20/17 0556 07/21/17 0356 07/22/17 0427 07/23/17 0505  WBC 8.3 13.9* 11.8* 8.6  HGB 10.9* 10.2* 10.8* 10.6*  HCT 31.4* 30.9* 32.2* 31.7*  PLT 110* 100* 123* 139*  MCV 96.5 99.0 97.8 97.4  MCH 33.4 32.8 32.8 32.7  MCHC 34.7 33.2 33.5 33.6  RDW 13.2 13.3 13.1 13.2    Chemistries  Recent Labs  Lab 07/20/17 0556 07/21/17 0356 07/22/17 0427 07/23/17 0505  NA 141 141 139 138  K 4.1 4.2 4.1 4.0  CL 109 112* 114* 111  CO2 23 23 22  21*  GLUCOSE 126* 103* 94 97  BUN 47* 37* 33* 32*  CREATININE 2.45* 2.05* 1.69* 1.52*  CALCIUM 8.8* 8.4* 8.4* 8.4*  AST 45*  --   --   --   ALT 33  --   --   --   ALKPHOS 57  --   --   --   BILITOT 0.9  --   --   --    ------------------------------------------------------------------------------------------------------------------ estimated creatinine clearance is 28.8 mL/min (A) (by C-G formula based on SCr of 1.52 mg/dL (H)). ------------------------------------------------------------------------------------------------------------------ No results for input(s): HGBA1C in the last 72  hours. ------------------------------------------------------------------------------------------------------------------ No results for input(s): CHOL, HDL, LDLCALC, TRIG, CHOLHDL, LDLDIRECT in the last 72 hours. ------------------------------------------------------------------------------------------------------------------ No results for input(s): TSH, T4TOTAL, T3FREE, THYROIDAB in the last 72 hours.  Invalid input(s): FREET3 ------------------------------------------------------------------------------------------------------------------ No results for input(s): VITAMINB12, FOLATE, FERRITIN, TIBC, IRON, RETICCTPCT in the last 72 hours.  Coagulation profile No results for input(s): INR, PROTIME in the last 168 hours.  No results for input(s): DDIMER in the last 72 hours.  Cardiac Enzymes No results for input(s): CKMB, TROPONINI, MYOGLOBIN in the last 168  hours.  Invalid input(s): CK ------------------------------------------------------------------------------------------------------------------ Invalid input(s): POCBNP    Assessment & Plan   IMPRESSION AND PLAN:  * Ac on chronic renal failure stage III Renal function back to baseline  * UTI with positive blood culture for E. coli   Change to oral Ceftin   * Htn Lisinopril resumed  * A fib   Cont amio+ ASA.  * Hypothyroidism   Cont levothyroxine.  *Generalized weakness PT evaluation       Code Status Orders  (From admission, onward)        Start     Ordered   07/20/17 1725  Do not attempt resuscitation (DNR)  Continuous    Question Answer Comment  In the event of cardiac or respiratory ARREST Do not call a "code blue"   In the event of cardiac or respiratory ARREST Do not perform Intubation, CPR, defibrillation or ACLS   In the event of cardiac or respiratory ARREST Use medication by any route, position, wound care, and other measures to relive pain and suffering. May use oxygen, suction and manual  treatment of airway obstruction as needed for comfort.      07/20/17 1724    Code Status History    Date Active Date Inactive Code Status Order ID Comments User Context   01/12/2016 0612 01/12/2016 0734 Full Code [id number]  Harrie Foreman, MD Inpatient   08/19/2014 0940 08/19/2014 1908 Full Code [id number]  Wellington Hampshire, MD Inpatient    Advance Directive Documentation     Most Recent Value  Type of Advance Directive  Healthcare Power of [place], Living will, Out of facility DNR (pink MOST or yellow form)  Pre-existing out of facility DNR order (yellow form or pink MOST form)  Yellow form placed in chart (order not valid for inpatient use)  "MOST" Form in Place?  -           Consults none  DVT Prophylaxis  Lovenox   Lab Results  Component Value Date   PLT 139 (L) 07/23/2017     Time Spent in minutes 35 minutes greater than 50% of time spent in care coordination and counseling patient regarding the condition and plan of care.   Dustin Flock M.D on 07/23/2017 at 1:56 PM  Between 7am to 6pm - Pager - [phone]  After 6pm go to [email or web address] - Proofreader  [employer]   Office  [phone]

## 2017-07-23 NOTE — Evaluation (Signed)
Physical Therapy Evaluation Patient Details Name: [name] MRN: [id number] DOB: Mar 12, 1933 Today's Date: 07/23/2017   History of Present Illness  Pt is an84 y.o.femalewith a known history of COPD, breast cancer, A fib, HTN, HLD, and PAD.  Pt had fever, chills and low back pain for last 1-2 days and also had nausea. Noted to have UTI and acute renal failure in ER.  PMH also includes hypothyroidism.     Clinical Impression  Pt presents with minor deficits in strength, gait, balance, and activity tolerance.  Pt was independent with bed mobility and transfers although pt was unable to stand or sit with good eccentric control without BUE assistance.  Pt was able to amb 90 feet with SBA without AD but with very small bilateral step length and slow cadence.  Pt's SpO2 and HR were WNL throughout session with no adverse symptoms noted.  Pt will benefit from HHPT services upon discharge to safely address above deficits for decreased caregiver assistance and eventual return to PLOF.      Follow Up Recommendations Home health PT    Equipment Recommendations  None recommended by PT    Recommendations for Other Services       Precautions / Restrictions Precautions Precautions: Fall Restrictions Weight Bearing Restrictions: No      Mobility  Bed Mobility Overal bed mobility: Independent             General bed mobility comments: No extra time or effort required for bed mobility tasks  Transfers Overall transfer level: Independent Equipment used: None             General transfer comment: Pt steady with transfers but required BUE assist to stand and sit safely  Ambulation/Gait Ambulation/Gait assistance: Supervision Ambulation Distance (Feet): 90 Feet Assistive device: None Gait Pattern/deviations: Step-through pattern;Decreased step length - right;Decreased step length - left   Gait velocity interpretation: Below normal speed for age/gender General Gait Details: Pt  steady with amb without an AD but ambulated with very short B steps and slow cadence  Stairs Stairs: (Deferred, pt requested not to leave room this session)          Wheelchair Mobility    Modified Rankin (Stroke Patients Only)       Balance Overall balance assessment: Needs assistance   Sitting balance-[name]: Normal     Standing balance support: No upper extremity supported Standing balance-[name]: Good Standing balance comment: Max SLS time 3 sec with no LOB; pt steady during balance training with feet apart and together with eyes open and closed                             Pertinent Vitals/Pain Pain Assessment: No/denies pain    Home Living Family/patient expects to be discharged to:: Private residence Living Arrangements: Spouse/significant other Available Help at Discharge: Family;Available PRN/intermittently Type of Home: House Home Access: Stairs to enter Entrance Stairs-Rails: Left Entrance Stairs-Number of Steps: 5 Home Layout: One level Home Equipment: Walker - 2 wheels;Cane - quad;Bedside commode;Other (comment) Additional Comments: Pt has life alert services    Prior Function Level of Independence: Independent with assistive device(s)         Comments: Pt Ind with amb in the home without AD except uses RW at night to amb to the bathroom, uses a QC in the community, Ind with ADLs, 2 falls in the last 6 months     Hand Dominance  Dominant Hand: Right    Extremity/Trunk Assessment   Upper Extremity Assessment Upper Extremity Assessment: Overall WFL for tasks assessed    Lower Extremity Assessment Lower Extremity Assessment: Generalized weakness       Communication   Communication: No difficulties  Cognition Arousal/Alertness: Awake/alert Behavior During Therapy: WFL for tasks assessed/performed Overall Cognitive Status: Within Functional Limits for tasks assessed                                         General Comments      Exercises Total Joint Exercises Ankle Circles/Pumps: AROM;Both;10 reps Hip ABduction/ADduction: AROM;Both;5 reps Straight Leg Raises: AROM;Both;5 reps Long Arc Quad: AROM;Both;10 reps Knee Flexion: AROM;Both;10 reps Marching in Standing: AROM;Both;5 reps Other Exercises Other Exercises: Sit to/from stand with focus on controlled eccentric contraction   Assessment/Plan    PT Assessment Patient needs continued PT services  PT Problem List Decreased strength;Decreased activity tolerance;Decreased balance       PT Treatment Interventions Gait training;DME instruction;Stair training;Balance training;Therapeutic exercise;Therapeutic activities;Patient/family education    PT Goals (Current goals can be found in the Care Plan section)  Acute Rehab PT Goals Patient Stated Goal: To be stronger PT Goal Formulation: With patient Time For Goal Achievement: 08/05/17 Potential to Achieve Goals: Good    Frequency Min 2X/week   Barriers to discharge        Co-evaluation               AM-PAC PT "6 Clicks" Daily Activity  Outcome Measure Difficulty turning over in bed (including adjusting bedclothes, sheets and blankets)?: None Difficulty moving from lying on back to sitting on the side of the bed? : None Difficulty sitting down on and standing up from a chair with arms (e.g., wheelchair, bedside commode, etc,.)?: None Help needed moving to and from a bed to chair (including a wheelchair)?: None Help needed walking in hospital room?: A Little Help needed climbing 3-5 steps with a railing? : A Little 6 Click Score: 22    End of Session Equipment Utilized During Treatment: Gait belt Activity Tolerance: Patient tolerated treatment well Patient left: in chair;with chair alarm set;with call bell/phone within reach Nurse Communication: Mobility status;Other (comment)(Pt requests BSC in RR, chair alarm battery low) PT Visit Diagnosis: Muscle weakness  (generalized) (M62.81);Difficulty in walking, not elsewhere classified (R26.2);History of falling (Z91.81)    Time: 1100-1138 PT Time Calculation (min) (ACUTE ONLY): 38 min   Charges:   PT Evaluation $PT Eval Low Complexity: 1 Low PT Treatments $Therapeutic Exercise: 8-22 mins   PT G Codes:        DRoyetta Asal PT, DPT 07/23/17, 11:59 AM

## 2017-07-24 LAB — BASIC METABOLIC PANEL
Anion gap: 5 (ref 5–15)
BUN: 32 mg/dL — AB (ref 6–20)
CO2: 26 mmol/L (ref 22–32)
CREATININE: 1.48 mg/dL — AB (ref 0.44–1.00)
Calcium: 8.6 mg/dL — ABNORMAL LOW (ref 8.9–10.3)
Chloride: 108 mmol/L (ref 101–111)
GFR calc Af Amer: 37 mL/min — ABNORMAL LOW (ref >60)
GFR calc non Af Amer: 32 mL/min — ABNORMAL LOW (ref >60)
GLUCOSE: 105 mg/dL — AB (ref 65–99)
POTASSIUM: 4.5 mmol/L (ref 3.5–5.1)
Sodium: 139 mmol/L (ref 135–145)

## 2017-07-24 MED ORDER — RISAQUAD PO CAPS: 2.0000 | ORAL_CAPSULE | Freq: Every day | ORAL | 0 refills | Status: AC

## 2017-07-24 MED ORDER — CEPHALEXIN 250 MG PO CAPS: 250.0000 mg | ORAL_CAPSULE | Freq: Three times a day (TID) | ORAL | 0 refills | Status: AC

## 2017-07-24 NOTE — Care Management Note (Signed)
Case Management Note  Patient Details  Name: [name] MRN: [id number] Date of Birth: 1932-10-27  Subjective/Objective:  Met with patient at bedside to discuss discharge planning. Patient lives at home with her husband. She uses a walker intermittently. She is independent with adls. PT recommending home health PT. Patient agreeable. Offered agencies. Referral to [facility] with [facility] for HHPT.  PCP is Dr. Sabra Heck.     Action/Plan: Discharging today. [facility] for PT  Expected Discharge Date:  07/24/17               Expected Discharge Plan:  [place]  In-House Referral:     Discharge planning Services  CM Consult  Post Acute Care Choice:  Home Health Choice offered to:     DME Arranged:    DME Agency:     HH Arranged:  PT [place]:  [place]  Status of Service:  Completed, signed off  If discussed at [place] of Stay Meetings, dates discussed:    Additional Comments:  Jolly Mango, RN 07/24/2017, 9:30 AM

## 2017-07-24 NOTE — Discharge Summary (Signed)
[employer] - [place] at [facility], 84 y.o., DOB 1932/12/04, MRN [id number]. Admission date: 07/20/2017 Discharge Date 07/24/2017 Primary MD Rusty Aus, MD Admitting Physician Vaughan Basta, MD  Admission Diagnosis  Acute renal insufficiency [N28.9] Acute cystitis without hematuria [N30.00]  Discharge Diagnosis   Active Problems:   ARF (acute renal failure) ([place]) on chronic kidney disease stage III   UTI with positive blood cultures Hypertension Atrial fibrillation Hypothyroidism       Hospital Course  Kalianne Fetting  is a 84 y.o. female with a known history of COPD, Breast cancer, A fib, Htn, HLD, PAD- lives at home with husband- walks mostly independently.  Patient had fevers chills and low back pain for the past 1-2 days.  Due to this she came to the ED and was noted to have a urinary tract infection.  Patient was treated with antibiotics.  Her blood cultures were positive for E. coli.  Source being the urine.  Patient was converted to oral antibiotics.  She was also noticed to have acute on chronic renal failure.  Given IV fluids with her renal function now much improved.  Patient is doing much better and is stable for discharge.              Consults  None  Significant Tests:  See full reports for all details    No results found.     Today   Subjective:   [name] feeling well denies any complaints Objective:   Blood pressure (!) 155/64, pulse 64, temperature 98.4 F (36.9 C), temperature source Oral, resp. rate 16, height 5\' 8"  (1.727 m), weight 64 kg (141 lb 1 oz), SpO2 97 %.  . No intake or output data in the 24 hours ending 07/24/17 1323  Exam VITAL SIGNS: Blood pressure (!) 155/64, pulse 64, temperature 98.4 F (36.9 C), temperature source Oral, resp. rate 16, height 5\' 8"  (1.727 m), weight 64 kg (141 lb 1 oz), SpO2 97 %.  GENERAL:  84 y.o.-year-old patient lying in the bed with no acute distress.  EYES: Pupils  equal, round, reactive to light and accommodation. No scleral icterus. Extraocular muscles intact.  HEENT: Head atraumatic, normocephalic. Oropharynx and nasopharynx clear.  NECK:  Supple, no jugular venous distention. No thyroid enlargement, no tenderness.  LUNGS: Normal breath sounds bilaterally, no wheezing, rales,rhonchi or crepitation. No use of accessory muscles of respiration.  CARDIOVASCULAR: S1, S2 normal. No murmurs, rubs, or gallops.  ABDOMEN: Soft, nontender, nondistended. Bowel sounds present. No organomegaly or mass.  EXTREMITIES: No pedal edema, cyanosis, or clubbing.  NEUROLOGIC: Cranial nerves II through XII are intact. Muscle strength 5/5 in all extremities. Sensation intact. Gait not checked.  PSYCHIATRIC: The patient is alert and oriented x 3.  SKIN: No obvious rash, lesion, or ulcer.   Data Review     CBC w Diff:  Lab Results  Component Value Date   WBC 8.6 07/23/2017   HGB 10.6 (L) 07/23/2017   HGB WILL FOLLOW 08/30/2015   HCT 31.7 (L) 07/23/2017   HCT WILL FOLLOW 08/30/2015   PLT 139 (L) 07/23/2017   PLT WILL FOLLOW 08/30/2015   LYMPHOPCT 11 01/17/2016   LYMPHOPCT 13.2 09/04/2013   MONOPCT 11 01/17/2016   MONOPCT 10.2 09/04/2013   EOSPCT 2 01/17/2016   EOSPCT 2.4 09/04/2013   BASOPCT 1 01/17/2016   BASOPCT 0.8 09/04/2013   CMP:  Lab Results  Component Value Date   NA 139 07/24/2017   NA  142 08/30/2015   NA 136 09/04/2013   K 4.5 07/24/2017   K 3.6 09/04/2013   CL 108 07/24/2017   CL 102 09/04/2013   CO2 26 07/24/2017   CO2 30 09/04/2013   BUN 32 (H) 07/24/2017   BUN 29 (H) 08/30/2015   BUN 23 (H) 09/04/2013   CREATININE 1.48 (H) 07/24/2017   CREATININE 1.26 (H) 03/31/2014   PROT 6.3 (L) 07/20/2017   PROT 6.1 (L) 09/04/2013   ALBUMIN 3.1 (L) 07/20/2017   ALBUMIN 2.2 (L) 09/04/2013   BILITOT 0.9 07/20/2017   BILITOT 0.3 09/04/2013   ALKPHOS 57 07/20/2017   ALKPHOS 105 09/04/2013   AST 45 (H) 07/20/2017   AST 71 (H) 09/04/2013   ALT 33  07/20/2017   ALT 48 09/04/2013  .  Micro Results Recent Results (from the past 240 hour(s))  Blood culture (routine x 2)     Status: Abnormal   Collection Time: 07/20/17  6:04 AM  Result Value Ref Range Status   Specimen Description   Final    BLOOD LEFT ARM Performed at [facility], [address], [place], [place] [address]    Special Requests   Final    BOTTLES DRAWN AEROBIC AND ANAEROBIC Blood Culture adequate volume Performed at [facility], [address] [place], [place] [address]    Culture  Setup Time   Final    GRAM NEGATIVE RODS IN BOTH AEROBIC AND ANAEROBIC BOTTLES CRITICAL RESULT CALLED TO, READ BACK BY AND VERIFIED WITH: Lorelle Formosa 07/20/17 @ [place] Performed at [facility], [place] [address], [place], [place] [address]    Culture ESCHERICHIA COLI (A)  Final   Report Status 07/22/2017 FINAL  Final   Organism ID, Bacteria ESCHERICHIA COLI  Final      Susceptibility   Escherichia coli - MIC*    AMPICILLIN >=32 RESISTANT Resistant     CEFAZOLIN <=4 SENSITIVE Sensitive     CEFEPIME <=1 SENSITIVE Sensitive     CEFTAZIDIME <=1 SENSITIVE Sensitive     CEFTRIAXONE <=1 SENSITIVE Sensitive     CIPROFLOXACIN <=0.25 SENSITIVE Sensitive     GENTAMICIN <=1 SENSITIVE Sensitive     IMIPENEM <=0.25 SENSITIVE Sensitive     TRIMETH/SULFA <=20 SENSITIVE Sensitive     AMPICILLIN/SULBACTAM >=32 RESISTANT Resistant     PIP/TAZO <=4 SENSITIVE Sensitive     Extended ESBL NEGATIVE Sensitive     * ESCHERICHIA COLI  Blood culture (routine x 2)     Status: Abnormal   Collection Time: 07/20/17  6:04 AM  Result Value Ref Range Status   Specimen Description   Final    BLOOD LEFT ARM Performed at [facility], [address], [place], [place] [address]    Special Requests   Final    BOTTLES DRAWN AEROBIC AND ANAEROBIC Blood Culture adequate volume Performed at [facility], [address] [place], [place] [address]    Culture   Setup Time   Final    GRAM NEGATIVE RODS IN BOTH AEROBIC AND ANAEROBIC BOTTLES CRITICAL RESULT CALLED TO, READ BACK BY AND VERIFIED WITH: Lorelle Formosa 07/20/17 @ [place] Performed at [facility], [address] [place], [place] [address]    Culture (A)  Final    ESCHERICHIA COLI SUSCEPTIBILITIES PERFORMED ON PREVIOUS CULTURE WITHIN THE LAST 5 DAYS. Performed at [facility], [place] [address] [place], [place] [address]    Report Status 07/23/2017 FINAL  Final  Urine culture     Status: Abnormal   Collection Time: 07/20/17  6:04 AM  Result Value Ref Range Status   Specimen Description   Final    URINE, RANDOM Performed at [facility], [address] [place], [place] [address]    Special Requests   Final    NONE Performed at [facility], [address] [place], [place] [address]    Culture MULTIPLE SPECIES PRESENT, SUGGEST RECOLLECTION (A)  Final   Report Status 07/21/2017 FINAL  Final  Blood Culture ID Panel (Reflexed)     Status: Abnormal   Collection Time: 07/20/17  6:04 AM  Result Value Ref Range Status   Enterococcus species NOT DETECTED NOT DETECTED Final   Listeria monocytogenes NOT DETECTED NOT DETECTED Final   Staphylococcus species NOT DETECTED NOT DETECTED Final   Staphylococcus aureus NOT DETECTED NOT DETECTED Final   Streptococcus species NOT DETECTED NOT DETECTED Final   Streptococcus agalactiae NOT DETECTED NOT DETECTED Final   Streptococcus pneumoniae NOT DETECTED NOT DETECTED Final   Streptococcus pyogenes NOT DETECTED NOT DETECTED Final   Acinetobacter baumannii NOT DETECTED NOT DETECTED Final   Enterobacteriaceae species DETECTED (A) NOT DETECTED Final    Comment: Enterobacteriaceae represent a large family of gram-negative bacteria, not a single organism. CRITICAL RESULT CALLED TO, READ BACK BY AND VERIFIED WITH: HANNAH LISSEY 07/20/17 @ [place]    Enterobacter cloacae complex NOT DETECTED NOT DETECTED Final    Escherichia coli DETECTED (A) NOT DETECTED Final    Comment: CRITICAL RESULT CALLED TO, READ BACK BY AND VERIFIED WITH: HANNAH LISSEY 07/20/17 @ [place]    Klebsiella oxytoca NOT DETECTED NOT DETECTED Final   Klebsiella pneumoniae NOT DETECTED NOT DETECTED Final   Proteus species NOT DETECTED NOT DETECTED Final   Serratia marcescens NOT DETECTED NOT DETECTED Final   Carbapenem resistance NOT DETECTED NOT DETECTED Final   Haemophilus influenzae NOT DETECTED NOT DETECTED Final   Neisseria meningitidis NOT DETECTED NOT DETECTED Final   Pseudomonas aeruginosa NOT DETECTED NOT DETECTED Final   Candida albicans NOT DETECTED NOT DETECTED Final   Candida glabrata NOT DETECTED NOT DETECTED Final   Candida krusei NOT DETECTED NOT DETECTED Final   Candida parapsilosis NOT DETECTED NOT DETECTED Final   Candida tropicalis NOT DETECTED NOT DETECTED Final    Comment: Performed at [facility], [address] [place], [place] [address]  Urine Culture     Status: None   Collection Time: 07/22/17  3:37 PM  Result Value Ref Range Status   Specimen Description   Final    URINE, RANDOM Performed at [facility], [address], [place], [place] [address]    Special Requests   Final    NONE Performed at [facility], [address], [place], [place] [address]    Culture   Final    NO GROWTH Performed at [facility], [place] [address], [place], [place] [address]    Report Status 07/23/2017 FINAL  Final        Code Status Orders  (From admission, onward)        Start     Ordered   07/20/17 1725  Do not attempt resuscitation (DNR)  Continuous    Question Answer Comment  In the event of cardiac or respiratory ARREST Do not call a "code blue"   In the event of cardiac or respiratory ARREST Do not perform Intubation, CPR, defibrillation or ACLS  In the event of cardiac or respiratory ARREST Use medication by any route, position, wound care, and other measures  to relive pain and suffering. May use oxygen, suction and manual treatment of airway obstruction as needed for comfort.      07/20/17 1724    Code Status History    Date Active Date Inactive Code Status Order ID Comments User Context   01/12/2016 0612 01/12/2016 0734 Full Code [id number]  Harrie Foreman, MD Inpatient   08/19/2014 0940 08/19/2014 1908 Full Code [id number]  Wellington [place], MD Inpatient    Advance Directive Documentation     Most Recent Value  Type of Advance Directive  Healthcare Power of [place], Living will, Out of facility DNR (pink MOST or yellow form)  Pre-existing out of facility DNR order (yellow form or pink MOST form)  Yellow form placed in chart (order not valid for inpatient use)  "MOST" Form in Place?  -          Follow-up Information    Rusty Aus, MD Follow up in 6 day(s).   Specialty:  Internal Medicine Contact information: [place] [address] [phone]        Rusty Aus, MD.   Specialty:  Internal Medicine Why:  Follow up with Dr Sabra Heck 07/31/17 @ 2pm Contact information: [place]  [address] [phone]           Discharge Medications   Allergies as of 07/24/2017      Reactions   Other Other (See Comments)   NO BP, VENIPUNCTURE, OR ACCESS IN RIGHT ARM - S/P MASTECTOMY ON RIGHT   Amlodipine Nausea And Vomiting   Ciprofloxacin Nausea Only   Doxycycline Itching, Other (See Comments)   shaky   Lipitor [atorvastatin] Other (See Comments)   Joint pain   Morphine And Related Nausea And Vomiting   Macrobid [nitrofurantoin Macrocrystal] Rash   Nitrofurantoin Rash   Penicillins Rash, Other (See Comments)   Has patient had a PCN reaction causing immediate rash, facial/tongue/throat swelling, SOB or lightheadedness with hypotension: yes Has patient had a PCN reaction causing severe rash involving mucus membranes  or skin necrosis: no Has patient had a PCN reaction that required hospitalization no Has patient had a PCN reaction occurring within the last 10 years: no If all of the above answers are "NO", then may proceed with Cephalosporin use.   Sulfa Antibiotics Rash   Sulfamethoxazole-trimethoprim Rash      Medication List    STOP taking these medications   acetaminophen 500 MG tablet Commonly known as:  TYLENOL     TAKE these medications   acidophilus Caps capsule Take 2 capsules by mouth daily for 10 days.   amiodarone 200 MG tablet Commonly known as:  PACERONE Take 1 tablet (200 mg total) by mouth daily.   aspirin EC 81 MG tablet Take 1 tablet (81 mg total) by mouth daily.   calcium-vitamin D 500-200 MG-UNIT tablet Commonly known as:  OSCAL WITH D Take 1 tablet by mouth daily with breakfast.   cephALEXin 250 MG capsule Commonly known as:  KEFLEX Take 1 capsule (250 mg total) by mouth every 8 (eight) hours for 10 days.   docusate sodium 100 MG capsule Commonly known as:  COLACE Take 100 mg by mouth daily as needed for mild constipation.   feeding supplement (ENSURE ENLIVE) Liqd Take 237 mLs by mouth 3 (three) times daily between  meals.   hydrALAZINE 25 MG tablet Commonly known as:  APRESOLINE Take 1 tablet (25 mg total) by mouth every 8 (eight) hours.   Iron-Vitamin C 65-125 MG Tabs Take 1 tablet by mouth daily.   levothyroxine 150 MCG tablet Commonly known as:  SYNTHROID, LEVOTHROID Take 150 mcg by mouth daily before breakfast.   lisinopril 10 MG tablet Commonly known as:  PRINIVIL,ZESTRIL Take 10 mg by mouth daily.   metaxalone 800 MG tablet Commonly known as:  SKELAXIN Take 1 tablet (800 mg total) by mouth 3 (three) times daily.   multivitamin tablet Take 1 tablet by mouth daily.   pantoprazole 40 MG tablet Commonly known as:  PROTONIX Take 1 tablet (40 mg total) by mouth daily.   potassium chloride 10 MEQ tablet Commonly known as:  K-DUR Take 10 mEq  by mouth 4 (four) times daily.   rosuvastatin 10 MG tablet Commonly known as:  CRESTOR Take 1 tablet (10 mg total) by mouth daily.   sertraline 50 MG tablet Commonly known as:  ZOLOFT Take 50 mg by mouth daily.   spironolactone 25 MG tablet Commonly known as:  ALDACTONE Take 25 mg by mouth daily.   torsemide 20 MG tablet Commonly known as:  DEMADEX TAKE 1 TABLET (20 MG TOTAL) BY MOUTH ONCE DAILY.   Vitamin D3 5000 units Caps Take 5,000 Units by mouth daily.          Total Time in preparing paper work, data evaluation and todays exam - 48 minutes  Dustin Flock M.D on 07/24/2017 at [place]  [phone]

## 2017-07-24 NOTE — Progress Notes (Signed)
Patient discharged home with home health. All discharge instructions given and all questions answered. 

## 2017-08-01 ENCOUNTER — Encounter: Payer: Self-pay | Admitting: *Deleted

## 2017-08-01 NOTE — Anesthesia Pain Management Evaluation Note (Signed)
NO BP/STICKS/IV RIGHT ARM

## 2017-08-01 NOTE — Pre-Procedure Instructions (Signed)
NO BP/STICKS/IV RIGHT ARM

## 2017-08-06 NOTE — Pre-Procedure Instructions (Signed)
CLEARANCE LOW RISK FOR EYE SURGERY FROM DR Royden Purl FAXED TO [place]

## 2017-08-07 ENCOUNTER — Encounter
Admission: RE | Disposition: A | Discharge status: Home / Self Care | Payer: Self-pay | Source: Ambulatory Visit | Attending: Ophthalmology

## 2017-08-07 ENCOUNTER — Ambulatory Visit
Admission: RE | Admit: 2017-08-07 | Discharge: 2017-08-07 | Disposition: A | Discharge status: Home / Self Care | Payer: Medicare HMO | Source: Ambulatory Visit | Attending: Ophthalmology | Admitting: Ophthalmology

## 2017-08-07 ENCOUNTER — Ambulatory Visit: Payer: Medicare HMO | Admitting: Anesthesiology

## 2017-08-07 DIAGNOSIS — Z885 Allergy status to narcotic agent status: Secondary | ICD-10-CM | POA: Insufficient documentation

## 2017-08-07 DIAGNOSIS — Z881 Allergy status to other antibiotic agents status: Secondary | ICD-10-CM | POA: Diagnosis not present

## 2017-08-07 DIAGNOSIS — H2512 Age-related nuclear cataract, left eye: Secondary | ICD-10-CM | POA: Insufficient documentation

## 2017-08-07 DIAGNOSIS — Z7982 Long term (current) use of aspirin: Secondary | ICD-10-CM | POA: Insufficient documentation

## 2017-08-07 DIAGNOSIS — I10 Essential (primary) hypertension: Secondary | ICD-10-CM | POA: Diagnosis not present

## 2017-08-07 DIAGNOSIS — E785 Hyperlipidemia, unspecified: Secondary | ICD-10-CM | POA: Diagnosis not present

## 2017-08-07 DIAGNOSIS — I739 Peripheral vascular disease, unspecified: Secondary | ICD-10-CM | POA: Diagnosis not present

## 2017-08-07 DIAGNOSIS — Z79899 Other long term (current) drug therapy: Secondary | ICD-10-CM | POA: Diagnosis not present

## 2017-08-07 DIAGNOSIS — E039 Hypothyroidism, unspecified: Secondary | ICD-10-CM | POA: Insufficient documentation

## 2017-08-07 DIAGNOSIS — K219 Gastro-esophageal reflux disease without esophagitis: Secondary | ICD-10-CM | POA: Insufficient documentation

## 2017-08-07 DIAGNOSIS — Z9849 Cataract extraction status, unspecified eye: Secondary | ICD-10-CM | POA: Insufficient documentation

## 2017-08-07 DIAGNOSIS — I4891 Unspecified atrial fibrillation: Secondary | ICD-10-CM | POA: Diagnosis not present

## 2017-08-07 DIAGNOSIS — Z79890 Hormone replacement therapy (postmenopausal): Secondary | ICD-10-CM | POA: Diagnosis not present

## 2017-08-07 DIAGNOSIS — J449 Chronic obstructive pulmonary disease, unspecified: Secondary | ICD-10-CM | POA: Diagnosis not present

## 2017-08-07 DIAGNOSIS — Z853 Personal history of malignant neoplasm of breast: Secondary | ICD-10-CM | POA: Diagnosis not present

## 2017-08-07 DIAGNOSIS — Z880 Allergy status to penicillin: Secondary | ICD-10-CM | POA: Insufficient documentation

## 2017-08-07 DIAGNOSIS — Z888 Allergy status to other drugs, medicaments and biological substances status: Secondary | ICD-10-CM | POA: Insufficient documentation

## 2017-08-07 HISTORY — PX: CATARACT EXTRACTION W/PHACO: SHX586

## 2017-08-07 HISTORY — DX: Anemia, unspecified: D64.9

## 2017-08-07 HISTORY — DX: Edema, unspecified: R60.9

## 2017-08-07 SURGERY — PHACOEMULSIFICATION, CATARACT, WITH IOL INSERTION
Anesthesia: Monitor Anesthesia Care | Site: Eye | Laterality: Left | Wound class: Clean

## 2017-08-07 MED ORDER — MIDAZOLAM HCL 2 MG/2ML IJ SOLN
INTRAMUSCULAR | Status: DC | PRN
  Administered 2017-08-07: 1 mg via INTRAVENOUS

## 2017-08-07 MED ORDER — POLYMYXIN B-TRIMETHOPRIM 10000-0.1 UNIT/ML-% OP SOLN: 1.0000 [drp] | OPHTHALMIC | Status: DC | PRN

## 2017-08-07 MED ORDER — EPINEPHRINE PF 1 MG/ML IJ SOLN
INTRAOCULAR | Status: DC | PRN
  Administered 2017-08-07: 1 mL via OPHTHALMIC

## 2017-08-07 MED ORDER — POVIDONE-IODINE 5 % OP SOLN
OPHTHALMIC | Status: DC | PRN
  Administered 2017-08-07: 1 via OPHTHALMIC

## 2017-08-07 MED ORDER — NA CHONDROIT SULF-NA HYALURON 40-17 MG/ML IO SOLN
INTRAOCULAR | Status: DC | PRN
  Administered 2017-08-07: 1 mL via INTRAOCULAR

## 2017-08-07 MED ORDER — MOXIFLOXACIN HCL 0.5 % OP SOLN
OPHTHALMIC | Status: AC
  Filled 2017-08-07: qty 3

## 2017-08-07 MED ORDER — ARMC OPHTHALMIC DILATING DROPS
1.0000 "application " | OPHTHALMIC | Status: AC
  Administered 2017-08-07 (×2): 1 via OPHTHALMIC

## 2017-08-07 MED ORDER — MIDAZOLAM HCL 2 MG/2ML IJ SOLN
INTRAMUSCULAR | Status: AC
  Filled 2017-08-07: qty 2

## 2017-08-07 MED ORDER — LIDOCAINE HCL (PF) 4 % IJ SOLN
INTRAOCULAR | Status: DC | PRN
  Administered 2017-08-07: 2 mL via OPHTHALMIC

## 2017-08-07 MED ORDER — CARBACHOL 0.01 % IO SOLN
INTRAOCULAR | Status: DC | PRN
  Administered 2017-08-07: 0.5 mL via INTRAOCULAR

## 2017-08-07 MED ORDER — MOXIFLOXACIN HCL 0.5 % OP SOLN: OPHTHALMIC | Status: DC | PRN

## 2017-08-07 MED ORDER — FENTANYL CITRATE (PF) 100 MCG/2ML IJ SOLN
INTRAMUSCULAR | Status: DC | PRN
  Administered 2017-08-07 (×2): 25 ug via INTRAVENOUS

## 2017-08-07 MED ORDER — POLYMYXIN B-TRIMETHOPRIM 10000-0.1 UNIT/ML-% OP SOLN
OPHTHALMIC | Status: AC
  Filled 2017-08-07: qty 10

## 2017-08-07 MED ORDER — SODIUM CHLORIDE 0.9 % IV SOLN
INTRAVENOUS | Status: DC
  Administered 2017-08-07: 12:00:00 via INTRAVENOUS

## 2017-08-07 MED ORDER — ARMC OPHTHALMIC DILATING DROPS
OPHTHALMIC | Status: AC
  Administered 2017-08-07: 1 via OPHTHALMIC
  Filled 2017-08-07: qty 0.4

## 2017-08-07 MED ORDER — POLYMYXIN B-TRIMETHOPRIM 10000-0.1 UNIT/ML-% OP SOLN
OPHTHALMIC | Status: DC | PRN
  Administered 2017-08-07: 1 [drp] via OPHTHALMIC

## 2017-08-07 MED ORDER — FENTANYL CITRATE (PF) 100 MCG/2ML IJ SOLN
INTRAMUSCULAR | Status: AC
  Filled 2017-08-07: qty 2

## 2017-08-07 SURGICAL SUPPLY — 16 items
GLOVE BIO SURGEON STRL SZ8 (GLOVE) ×2 IMPLANT
GLOVE BIOGEL M 6.5 STRL (GLOVE) ×2 IMPLANT
GLOVE SURG LX 8.0 MICRO (GLOVE) ×1
GLOVE SURG LX STRL 8.0 MICRO (GLOVE) ×1 IMPLANT
GOWN STRL REUS W/ TWL LRG LVL3 (GOWN DISPOSABLE) ×2 IMPLANT
GOWN STRL REUS W/TWL LRG LVL3 (GOWN DISPOSABLE) ×4
LABEL CATARACT MEDS ST (LABEL) ×2 IMPLANT
LENS IOL TECNIS ITEC 18.5 (Intraocular Lens) ×1 IMPLANT
PACK CATARACT (MISCELLANEOUS) ×2 IMPLANT
PACK CATARACT BRASINGTON LX (MISCELLANEOUS) ×2 IMPLANT
PACK EYE AFTER SURG (MISCELLANEOUS) ×2 IMPLANT
SOL BSS BAG (MISCELLANEOUS) ×2
SOLUTION BSS BAG (MISCELLANEOUS) ×1 IMPLANT
SYR 5ML LL (SYRINGE) ×2 IMPLANT
WATER STERILE IRR 250ML POUR (IV SOLUTION) ×2 IMPLANT
WIPE NON LINTING 3.25X3.25 (MISCELLANEOUS) ×2 IMPLANT

## 2017-08-07 NOTE — Anesthesia Preprocedure Evaluation (Signed)
Anesthesia Evaluation  Patient identified by MRN, date of birth, ID band Patient awake    Reviewed: Allergy & Precautions, NPO status , Patient's Chart, lab work & pertinent test results  History of Anesthesia Complications Negative for: history of anesthetic complications  Airway Mallampati: II  TM Distance: >3 FB Neck ROM: Full    Dental  (+) Poor Dentition   Pulmonary neg sleep apnea, COPD,    breath sounds clear to auscultation- rhonchi (-) wheezing      Cardiovascular hypertension, + Peripheral Vascular Disease and +CHF (preserved EF)  (-) CAD, (-) Past MI, (-) Cardiac Stents and (-) CABG + dysrhythmias Atrial Fibrillation  Rhythm:Regular Rate:Normal - Systolic murmurs and - Diastolic murmurs Echo 07/13/49: - Left ventricle: The cavity size was normal. Wall thickness was   increased in a pattern of mild LVH. Systolic function was normal.   The estimated ejection fraction was in the range of 60% to 65%.   Wall motion was normal; there were no regional wall motion   abnormalities. Features are consistent with a pseudonormal left   ventricular filling pattern, with concomitant abnormal relaxation   and increased filling pressure (grade 2 diastolic dysfunction). - Aortic valve: There was trivial regurgitation by color doppler. - Mitral valve: Calcified annulus. There was mild regurgitation. - Left atrium: The atrium was mildly dilated. - Tricuspid valve: There was mild-moderate regurgitation. - Pulmonary arteries: Systolic pressure was mildly to moderately   increased. PA peak pressure: 44 mm Hg (S).   Neuro/Psych PSYCHIATRIC DISORDERS Depression negative neurological ROS     GI/Hepatic Neg liver ROS, GERD  ,  Endo/Other  neg diabetesHypothyroidism   Renal/GU Renal InsufficiencyRenal disease     Musculoskeletal  (+) Arthritis ,   Abdominal (+) - obese,   Peds  Hematology  (+) anemia ,   Anesthesia Other  Findings Past Medical History: No date: Anemia No date: Arthritis     Comment:  "back, hands" (09/08/2015) No date: Atrial fibrillation (HCC)     Comment:  Echocardiogram 5/12: normal LV function, moderate LAE,               mild RAE, mild AI and MR and mild-to-moderate TR.                Myoview 5/ 12 showed an ejection fraction of 73% and               normal perfusion.   Patient underwent elective DCCV on               08/26/10 2007, 2013: Breast cancer [facility])     Comment:  right breast- 2007 radiation-mastectomy No date: Cancer of right breast ([place]) No date: Chronic lower back pain No date: Complication of anesthesia     Comment:  "took me about 1 week to know what was going on after               one of my knee ORs" No date: COPD (chronic obstructive pulmonary disease) (HCC) No date: DDD (degenerative disc disease), lumbar No date: Depression 09/01/2015: Ductal carcinoma in situ (DCIS) of right breast No date: Dyspnea     Comment:  DOE No date: Dysrhythmia     Comment:  AFIB No date: Edema     Comment:  FEET/ANKLES No date: GERD (gastroesophageal reflux disease) No date: Hyperlipidemia No date: Hypertension No date: Hypothyroid No date: PAD (peripheral artery disease) (HCC) No date: PVD (peripheral vascular disease) ([place]) No date: Sepsis ([place])  Comment:  UTI/ECOLI I WEEK AGO   Reproductive/Obstetrics                             Anesthesia Physical Anesthesia Plan  ASA: III  Anesthesia Plan: MAC   Post-op Pain Management:    Induction: Intravenous  PONV Risk Score and Plan: 2 and Midazolam  Airway Management Planned: Natural Airway  Additional Equipment:   Intra-op Plan:   Post-operative Plan:   Informed Consent: I have reviewed the patients History and Physical, chart, labs and discussed the procedure including the risks, benefits and alternatives for the proposed anesthesia with the patient or authorized representative who has  indicated his/her understanding and acceptance.     Plan Discussed with: CRNA and Anesthesiologist  Anesthesia Plan Comments:         Anesthesia Quick Evaluation

## 2017-08-07 NOTE — Discharge Instructions (Signed)
FOLLOW DR. PORFILIO'S POSTOP EYE DROP INSTRUCTION SHEET AS REVIEWED.  Eye Surgery Discharge Instructions  Expect mild scratchy sensation or mild soreness. DO NOT RUB YOUR EYE!  The day of surgery:  Minimal physical activity, but bed rest is not required  No reading, computer work, or close hand work  No bending, lifting, or straining.  May watch TV  For 24 hours:  No driving, legal decisions, or alcoholic beverages  Safety precautions  Eat anything you prefer: It is better to start with liquids, then soup then solid foods.  _____ Eye patch should be worn until postoperative exam tomorrow.  ____ Solar shield eyeglasses should be worn for comfort in the sunlight/patch while sleeping  Resume all regular medications including aspirin or Coumadin if these were discontinued prior to surgery. You may shower, bathe, shave, or wash your hair. Tylenol may be taken for mild discomfort.  Call your doctor if you experience significant pain, nausea, or vomiting, fever > 101 or other signs of infection. [phone] or [phone] Specific instructions:  Follow-up Information    Birder Robson, MD Follow up.   Specialty:  Ophthalmology Why:  Wednesday 08/08/17 @ 10:40 am Contact information: [place] [address] [phone]

## 2017-08-07 NOTE — Anesthesia Post-op Follow-up Note (Signed)
Anesthesia QCDR form completed.        

## 2017-08-07 NOTE — H&P (Signed)
All labs reviewed. Abnormal studies sent to patients PCP when indicated.  Previous H&P reviewed, patient examined, there are NO CHANGES.  Latoya Harmes Porfilio4/23/201911:50 AM

## 2017-08-07 NOTE — Transfer of Care (Signed)
Immediate Anesthesia Transfer of Care Note  Patient: [name]  Procedure(s) Performed: CATARACT EXTRACTION PHACO AND INTRAOCULAR LENS PLACEMENT (IOC) (Left Eye)  Patient Location: PACU  Anesthesia Type:MAC  Level of Consciousness: awake, alert  and oriented  Airway & Oxygen Therapy: Patient Spontanous Breathing  Post-op Assessment: Report given to RN  Post vital signs: Reviewed and stable  Last Vitals:  Vitals Value Taken Time  BP    Temp    Pulse    Resp    SpO2      Last Pain:  Vitals:   08/07/17 1118  TempSrc: Tympanic  PainSc: 0-No pain         Complications: No apparent anesthesia complications

## 2017-08-07 NOTE — Anesthesia Postprocedure Evaluation (Signed)
Anesthesia Post Note  Patient: Pricilla Holm Peer  Procedure(s) Performed: CATARACT EXTRACTION PHACO AND INTRAOCULAR LENS PLACEMENT (IOC) (Left Eye)  Patient location during evaluation: PACU Anesthesia Type: MAC Level of consciousness: awake Pain management: pain level controlled Vital Signs Assessment: post-procedure vital signs reviewed and stable Respiratory status: spontaneous breathing Cardiovascular status: blood pressure returned to baseline Postop Assessment: no apparent nausea or vomiting Anesthetic complications: no     Last Vitals:  Vitals:   08/07/17 1118  BP: (!) 162/57  Pulse: 61  Resp: 15  Temp: (!) 35.4 C  SpO2: 100%    Last Pain:  Vitals:   08/07/17 1118  TempSrc: Tympanic  PainSc: 0-No pain                 Eloise Harman Zorianna Taliaferro

## 2017-08-07 NOTE — Op Note (Signed)
PREOPERATIVE DIAGNOSIS:  Nuclear sclerotic cataract of the left eye.   POSTOPERATIVE DIAGNOSIS:  Nuclear sclerotic cataract of the left eye.   OPERATIVE PROCEDURE: Procedure(s): CATARACT EXTRACTION PHACO AND INTRAOCULAR LENS PLACEMENT (IOC)   SURGEON:  Birder Robson, MD.   ANESTHESIA:  Anesthesiologist: Emmie Niemann, MD CRNA: Hedda Slade, CRNA Student Nurse Anesthetist: Docia Furl, RN  1.      Managed anesthesia care. 2.     0.77ml of Shugarcaine was instilled following the paracentesis   COMPLICATIONS:  None.   TECHNIQUE:   Stop and chop   DESCRIPTION OF PROCEDURE:  The patient was examined and consented in the preoperative holding area where the aforementioned topical anesthesia was applied to the left eye and then brought back to the Operating Room where the left eye was prepped and draped in the usual sterile ophthalmic fashion and a lid speculum was placed. A paracentesis was created with the side port blade and the anterior chamber was filled with viscoelastic. A near clear corneal incision was performed with the steel keratome. A continuous curvilinear capsulorrhexis was performed with a cystotome followed by the capsulorrhexis forceps. Hydrodissection and hydrodelineation were carried out with BSS on a blunt cannula. The lens was removed in a stop and chop  technique and the remaining cortical material was removed with the irrigation-aspiration handpiece. The capsular bag was inflated with viscoelastic and the Technis [id number] lens was placed in the capsular bag without complication. The remaining viscoelastic was removed from the eye with the irrigation-aspiration handpiece. The wounds were hydrated. The anterior chamber was flushed with Miostat and the eye was inflated to physiologic pressure. Polytrim was applied topically.. The wounds were found to be water tight.. The patient was given protective glasses to wear throughout the day and a shield with which to sleep tonight.  The patient was also given drops with which to begin a drop regimen today and will follow-up with me in one day. Implant Name Type Inv. Item Serial No. Manufacturer Lot No. LRB No. Used  LENS IOL DIOP 18.5 - [id number] Intraocular Lens LENS IOL DIOP 18.5 [id number] AMO  Left 1    Procedure(s) with comments: CATARACT EXTRACTION PHACO AND INTRAOCULAR LENS PLACEMENT (IOC) (Left) - [place] 00:44 AP% 14.1 CDE 6.32 Fluid pack lot # [id number]  Electronically signed: Birder Robson 08/07/2017 12:19 PM

## 2017-08-08 ENCOUNTER — Encounter: Payer: Self-pay | Admitting: Ophthalmology

## 2017-09-28 ENCOUNTER — Inpatient Hospital Stay: Payer: Medicare HMO

## 2017-09-28 ENCOUNTER — Inpatient Hospital Stay: Payer: Medicare HMO | Admitting: Oncology

## 2017-10-24 ENCOUNTER — Other Ambulatory Visit: Payer: Self-pay | Admitting: Internal Medicine

## 2017-10-24 DIAGNOSIS — Z1231 Encounter for screening mammogram for malignant neoplasm of breast: Secondary | ICD-10-CM

## 2017-11-23 ENCOUNTER — Ambulatory Visit
Admission: RE | Admit: 2017-11-23 | Discharge: 2017-11-23 | Disposition: A | Discharge status: Home / Self Care | Payer: Medicare HMO | Source: Ambulatory Visit | Attending: Internal Medicine | Admitting: Internal Medicine

## 2017-11-23 DIAGNOSIS — Z1231 Encounter for screening mammogram for malignant neoplasm of breast: Secondary | ICD-10-CM | POA: Diagnosis not present

## 2017-12-04 ENCOUNTER — Ambulatory Visit: Payer: Medicare HMO | Admitting: Student in an Organized Health Care Education/Training Program

## 2017-12-21 ENCOUNTER — Inpatient Hospital Stay: Admission: RE | Admit: 2017-12-21 | Payer: Medicare HMO | Source: Ambulatory Visit

## 2017-12-25 ENCOUNTER — Other Ambulatory Visit: Payer: Self-pay

## 2017-12-25 ENCOUNTER — Ambulatory Visit
Admission: RE | Admit: 2017-12-25 | Discharge: 2017-12-25 | Disposition: A | Discharge status: Home / Self Care | Payer: Medicare HMO | Source: Ambulatory Visit | Attending: Surgery | Admitting: Surgery

## 2017-12-25 ENCOUNTER — Encounter
Admission: RE | Admit: 2017-12-25 | Discharge: 2017-12-25 | Disposition: A | Discharge status: Home / Self Care | Payer: Medicare HMO | Source: Ambulatory Visit | Attending: Surgery | Admitting: Surgery

## 2017-12-25 DIAGNOSIS — Z853 Personal history of malignant neoplasm of breast: Secondary | ICD-10-CM | POA: Diagnosis not present

## 2017-12-25 DIAGNOSIS — Z01818 Encounter for other preprocedural examination: Secondary | ICD-10-CM

## 2017-12-25 HISTORY — DX: Chronic kidney disease, stage 4 (severe): N18.4

## 2017-12-25 LAB — CBC
HEMATOCRIT: 34.7 % — AB (ref 35.0–47.0)
Hemoglobin: 12 g/dL (ref 12.0–16.0)
MCH: 33.4 pg (ref 26.0–34.0)
MCHC: 34.5 g/dL (ref 32.0–36.0)
MCV: 96.7 fL (ref 80.0–100.0)
Platelets: 236 10*3/uL (ref 150–440)
RBC: 3.59 MIL/uL — AB (ref 3.80–5.20)
RDW: 13.7 % (ref 11.5–14.5)
WBC: 7.3 10*3/uL (ref 3.6–11.0)

## 2017-12-25 LAB — URINALYSIS, ROUTINE W REFLEX MICROSCOPIC
BILIRUBIN URINE: NEGATIVE
Glucose, UA: NEGATIVE mg/dL
HGB URINE DIPSTICK: NEGATIVE
Ketones, ur: NEGATIVE mg/dL
Leukocytes, UA: NEGATIVE
NITRITE: NEGATIVE
PROTEIN: NEGATIVE mg/dL
SPECIFIC GRAVITY, URINE: 1.006 (ref 1.005–1.030)
pH: 5 (ref 5.0–8.0)

## 2017-12-25 LAB — TYPE AND SCREEN
ABO/RH(D): A POS
Antibody Screen: NEGATIVE

## 2017-12-25 LAB — SURGICAL PCR SCREEN
MRSA, PCR: NEGATIVE
STAPHYLOCOCCUS AUREUS: NEGATIVE

## 2017-12-25 LAB — BASIC METABOLIC PANEL
ANION GAP: 11 (ref 5–15)
BUN: 43 mg/dL — ABNORMAL HIGH (ref 8–23)
CALCIUM: 9.3 mg/dL (ref 8.9–10.3)
CO2: 26 mmol/L (ref 22–32)
Chloride: 104 mmol/L (ref 98–111)
Creatinine, Ser: 2.18 mg/dL — ABNORMAL HIGH (ref 0.44–1.00)
GFR calc non Af Amer: 20 mL/min — ABNORMAL LOW (ref >60)
GFR, EST AFRICAN AMERICAN: 23 mL/min — AB (ref >60)
GLUCOSE: 93 mg/dL (ref 70–99)
POTASSIUM: 4.3 mmol/L (ref 3.5–5.1)
Sodium: 141 mmol/L (ref 135–145)

## 2017-12-25 LAB — PROTIME-INR
INR: 0.94
Prothrombin Time: 12.5 seconds (ref 11.4–15.2)

## 2017-12-25 NOTE — Patient Instructions (Signed)
Your procedure is scheduled on: Thursday 01/03/18 Report to [facility]. To find out your arrival time please call [phone] between 1PM - 3PM on Wednesday 01/02/18.  Remember: Instructions that are not followed completely may result in serious medical risk, up to and including death, or upon the discretion of your surgeon and anesthesiologist your surgery may need to be rescheduled.     _X__ 1. Do not eat food after midnight the night before your procedure.                 No gum chewing or hard candies. You may drink clear liquids up to 2 hours                 before you are scheduled to arrive for your surgery- DO not drink clear                 liquids within 2 hours of the start of your surgery.                 Clear Liquids include:  water, apple juice without pulp, clear carbohydrate                 drink such as Clearfast or Gatorade, Black Coffee or Tea (Do not add                 anything to coffee or tea).  __X__2.  On the morning of surgery brush your teeth with toothpaste and water, you                 may rinse your mouth with mouthwash if you wish.  Do not swallow any              toothpaste of mouthwash.     _X__ 3.  No Alcohol for 24 hours before or after surgery.   _X__ 4.  Do Not Smoke or use e-cigarettes For 24 Hours Prior to Your Surgery.                 Do not use any chewable tobacco products for at least 6 hours prior to                 surgery.  ____  5.  Bring all medications with you on the day of surgery if instructed.   __X__  6.  Notify your doctor if there is any change in your medical condition      (cold, fever, infections).     Do not wear jewelry, make-up, hairpins, clips or nail polish. Do not wear lotions, powders, or perfumes.  Do not shave 48 hours prior to surgery. Men may shave face and neck. Do not bring valuables to the hospital.    [facility] is not responsible for any belongings or  valuables.  Contacts, dentures/partials or body piercings may not be worn into surgery. Bring a case for your contacts, glasses or hearing aids, a denture cup will be supplied. Leave your suitcase in the car. After surgery it may be brought to your room. For patients admitted to the hospital, discharge time is determined by your treatment team.   Patients discharged the day of surgery will not be allowed to drive home.   Please read over the following fact sheets that you were given:   MRSA Information  __X__ Take these medicines the morning of surgery with A SIP OF WATER:  1. amiodarone (PACERONE)  2. levothyroxine (SYNTHROID, LEVOTHROID)   3. pantoprazole (PROTONIX  4. PARoxetine (PAXIL)  5. rosuvastatin (CRESTOR)  6.  ____ Fleet Enema (as directed)   __X__ Use CHG Soap/SAGE wipes as directed  ____ Use inhalers on the day of surgery  ____ Stop metformin/Janumet/Farxiga 2 days prior to surgery    ____ Take 1/2 of usual insulin dose the night before surgery. No insulin the morning          of surgery.   ____ Stop Blood Thinners Coumadin/Plavix/Xarelto/Pleta/Pradaxa/Eliquis/Effient/Aspirin  on   Or contact your Surgeon, Cardiologist or Medical Doctor regarding  ability to stop your blood thinners  __X__ Stop Anti-inflammatories 7 days before surgery such as Advil, Ibuprofen, Motrin,  BC or Goodies Powder, Naprosyn, Naproxen, Aleve, Aspirin    __X__ Stop all herbal supplements, fish oil or vitamin E until after surgery.    ____ Bring C-Pap to the hospital.

## 2017-12-26 NOTE — Anesthesia Pain Management Evaluation Note (Signed)
DR Tiajuana Amass FOR SPINAL/ NO GENERAL

## 2017-12-26 NOTE — Pre-Procedure Instructions (Signed)
MET B FAXED TO DR [place] Loleta Chance. ASKED DR Sabra Heck TO ADDRESS AS STILL CLEARED TO PROCEED SINCE 9/19 NOTE

## 2017-12-27 NOTE — Pre-Procedure Instructions (Signed)
CLEARED TO PROCEED BY DR Loleta Chance. RENAL FUNCTION AT BASELINE

## 2018-01-15 ENCOUNTER — Encounter: Admission: RE | Payer: Self-pay | Source: Ambulatory Visit

## 2018-01-15 ENCOUNTER — Inpatient Hospital Stay: Admission: RE | Admit: 2018-01-15 | Payer: Medicare HMO | Source: Ambulatory Visit | Admitting: Surgery

## 2018-01-15 SURGERY — ARTHROPLASTY, KNEE, TOTAL
Anesthesia: Choice | Laterality: Left

## 2018-01-30 ENCOUNTER — Other Ambulatory Visit: Payer: Self-pay | Admitting: Cardiovascular Disease

## 2018-02-21 ENCOUNTER — Inpatient Hospital Stay
Admission: RE | Admit: 2018-02-21 | Discharge: 2018-02-21 | Disposition: A | Discharge status: Home / Self Care | Payer: Medicare HMO | Source: Ambulatory Visit

## 2018-02-21 NOTE — Pre-Procedure Instructions (Signed)
Contacted patient to go over Preop instructions and she said she has an appointment with Marylee Floras PA tomorrow to do her preop exam and determine if she will proceed with surgery.  She has a sore on the leg she is suppose to have surgery on and so she thinks it will be postponed.  She will call after her appointment if she is to proceed with the surgery to get her instructions.

## 2018-02-25 MED ORDER — CLINDAMYCIN PHOSPHATE 900 MG/50ML IV SOLN
900.0000 mg | Freq: Once | INTRAVENOUS | Status: AC
  Administered 2018-02-26: 900 mg via INTRAVENOUS

## 2018-02-25 NOTE — Pre-Procedure Instructions (Signed)
PATIENT STATES SHE SAW SURGEON'S OFFICE Friday AND CONCERNED LEG SORE IS NO BETTER. SEEING THEM AGAIN THIS AM. STILL UNSURE IF WILL HAVE SURGERY TOMORROW. GIVEN PREOP INSTRUCTIONS.

## 2018-02-25 NOTE — Patient Instructions (Signed)
Your procedure is scheduled on: 02/26/18 Report to [facility]. [facility] SECOND FLOOR To find out your arrival time please call [phone] between 1PM - 3PM on 1111/19.  Remember: Instructions that are not followed completely may result in serious medical risk,  up to and including death, or upon the discretion of your surgeon and anesthesiologist your  surgery may need to be rescheduled.     _X__ 1. Do not eat food after midnight the night before your procedure.                 No gum chewing or hard candies. You may drink clear liquids up to 2 hours                 before you are scheduled to arrive for your surgery- DO not drink clear                 liquids within 2 hours of the start of your surgery.                 Clear Liquids include:  water, apple juice without pulp, clear carbohydrate                 drink such as Clearfast of Gatorade, Black Coffee or Tea (Do not add                 anything to coffee or tea).  __X__2.  On the morning of surgery brush your teeth with toothpaste and water, you                may rinse your mouth with mouthwash if you wish.  Do not swallow any toothpaste of mouthwash.     _X__ 3.  No Alcohol for 24 hours before or after surgery.   _X__ 4.  Do Not Smoke or use e-cigarettes For 24 Hours Prior to Your Surgery.                 Do not use any chewable tobacco products for at least 6 hours prior to                 surgery.  ____  5.  Bring all medications with you on the day of surgery if instructed.   _X___  6.  Notify your doctor if there is any change in your medical condition      (cold, fever, infections).     Do not wear jewelry, make-up, hairpins, clips or nail polish. Do not wear lotions, powders, or perfumes. You may wear deodorant. Do not shave 48 hours prior to surgery. Men may shave face and neck. Do not bring valuables to the hospital.    [facility] is not responsible for any belongings or  valuables.  Contacts, dentures or bridgework may not be worn into surgery. Leave your suitcase in the car. After surgery it may be brought to your room. For patients admitted to the hospital, discharge time is determined by your treatment team.   Patients discharged the day of surgery will not be allowed to drive home.   __X__ Take these medicines the morning of surgery with A SIP OF WATER:    1.AMIODARONE  2. PANTOPRAZOLE  3.LEVOTHYROXINE  4.  5.  6.  ____ Fleet Enema (as directed)   __X__ Use CHG Soap as directed  ____ Use inhalers on the day of surgery  ____ Stop metformin 2 days prior to surgery    ____  Take 1/2 of usual insulin dose the night before surgery. No insulin the morning          of surgery.   _X___ Stop Coumadin/Plavix/aspirin on   ALREADY STOPPED ASPIRIN  ____ Stop Anti-inflammatories on  ____ Stop supplements until after surgery.    ____ Bring C-Pap to the hospital.

## 2018-02-26 ENCOUNTER — Inpatient Hospital Stay: Payer: Medicare HMO | Admitting: Anesthesiology

## 2018-02-26 ENCOUNTER — Encounter
Admission: RE | Disposition: A | Discharge status: Skilled Nursing Facility | Payer: Self-pay | Source: Ambulatory Visit | Attending: Surgery

## 2018-02-26 ENCOUNTER — Other Ambulatory Visit: Payer: Self-pay

## 2018-02-26 ENCOUNTER — Other Ambulatory Visit: Payer: Self-pay | Admitting: Cardiovascular Disease

## 2018-02-26 ENCOUNTER — Inpatient Hospital Stay: Payer: Medicare HMO

## 2018-02-26 ENCOUNTER — Inpatient Hospital Stay
Admission: RE | Admit: 2018-02-26 | Discharge: 2018-03-02 | DRG: 470 | Disposition: A | Discharge status: Skilled Nursing Facility | Payer: Medicare HMO | Source: Ambulatory Visit | Attending: Surgery | Admitting: Surgery

## 2018-02-26 DIAGNOSIS — D631 Anemia in chronic kidney disease: Secondary | ICD-10-CM | POA: Diagnosis present

## 2018-02-26 DIAGNOSIS — Z9011 Acquired absence of right breast and nipple: Secondary | ICD-10-CM

## 2018-02-26 DIAGNOSIS — Z7982 Long term (current) use of aspirin: Secondary | ICD-10-CM

## 2018-02-26 DIAGNOSIS — Z881 Allergy status to other antibiotic agents status: Secondary | ICD-10-CM

## 2018-02-26 DIAGNOSIS — I82409 Acute embolism and thrombosis of unspecified deep veins of unspecified lower extremity: Secondary | ICD-10-CM

## 2018-02-26 DIAGNOSIS — I82452 Acute embolism and thrombosis of left peroneal vein: Secondary | ICD-10-CM | POA: Diagnosis not present

## 2018-02-26 DIAGNOSIS — Z853 Personal history of malignant neoplasm of breast: Secondary | ICD-10-CM | POA: Diagnosis not present

## 2018-02-26 DIAGNOSIS — I4819 Other persistent atrial fibrillation: Secondary | ICD-10-CM | POA: Diagnosis present

## 2018-02-26 DIAGNOSIS — Z79899 Other long term (current) drug therapy: Secondary | ICD-10-CM | POA: Diagnosis not present

## 2018-02-26 DIAGNOSIS — Z882 Allergy status to sulfonamides status: Secondary | ICD-10-CM | POA: Diagnosis not present

## 2018-02-26 DIAGNOSIS — Z96651 Presence of right artificial knee joint: Secondary | ICD-10-CM | POA: Diagnosis present

## 2018-02-26 DIAGNOSIS — K219 Gastro-esophageal reflux disease without esophagitis: Secondary | ICD-10-CM | POA: Diagnosis present

## 2018-02-26 DIAGNOSIS — E785 Hyperlipidemia, unspecified: Secondary | ICD-10-CM | POA: Diagnosis present

## 2018-02-26 DIAGNOSIS — Z885 Allergy status to narcotic agent status: Secondary | ICD-10-CM | POA: Diagnosis not present

## 2018-02-26 DIAGNOSIS — Z880 Allergy status to penicillin: Secondary | ICD-10-CM | POA: Diagnosis not present

## 2018-02-26 DIAGNOSIS — N184 Chronic kidney disease, stage 4 (severe): Secondary | ICD-10-CM | POA: Diagnosis present

## 2018-02-26 DIAGNOSIS — J449 Chronic obstructive pulmonary disease, unspecified: Secondary | ICD-10-CM | POA: Diagnosis present

## 2018-02-26 DIAGNOSIS — M6250 Muscle wasting and atrophy, not elsewhere classified, unspecified site: Secondary | ICD-10-CM | POA: Diagnosis present

## 2018-02-26 DIAGNOSIS — I130 Hypertensive heart and chronic kidney disease with heart failure and stage 1 through stage 4 chronic kidney disease, or unspecified chronic kidney disease: Secondary | ICD-10-CM | POA: Diagnosis present

## 2018-02-26 DIAGNOSIS — M1712 Unilateral primary osteoarthritis, left knee: Secondary | ICD-10-CM | POA: Diagnosis present

## 2018-02-26 DIAGNOSIS — I340 Nonrheumatic mitral (valve) insufficiency: Secondary | ICD-10-CM | POA: Diagnosis not present

## 2018-02-26 DIAGNOSIS — I5032 Chronic diastolic (congestive) heart failure: Secondary | ICD-10-CM | POA: Diagnosis present

## 2018-02-26 DIAGNOSIS — Z96652 Presence of left artificial knee joint: Secondary | ICD-10-CM | POA: Diagnosis not present

## 2018-02-26 DIAGNOSIS — Z888 Allergy status to other drugs, medicaments and biological substances status: Secondary | ICD-10-CM

## 2018-02-26 DIAGNOSIS — I739 Peripheral vascular disease, unspecified: Secondary | ICD-10-CM | POA: Diagnosis present

## 2018-02-26 DIAGNOSIS — Z681 Body mass index (BMI) 19 or less, adult: Secondary | ICD-10-CM

## 2018-02-26 DIAGNOSIS — R630 Anorexia: Secondary | ICD-10-CM | POA: Diagnosis present

## 2018-02-26 DIAGNOSIS — E039 Hypothyroidism, unspecified: Secondary | ICD-10-CM | POA: Diagnosis present

## 2018-02-26 DIAGNOSIS — R410 Disorientation, unspecified: Secondary | ICD-10-CM | POA: Diagnosis present

## 2018-02-26 DIAGNOSIS — I4891 Unspecified atrial fibrillation: Secondary | ICD-10-CM | POA: Diagnosis not present

## 2018-02-26 DIAGNOSIS — Z66 Do not resuscitate: Secondary | ICD-10-CM | POA: Diagnosis present

## 2018-02-26 HISTORY — PX: TOTAL KNEE ARTHROPLASTY: SHX125

## 2018-02-26 HISTORY — DX: Personal history of other infectious and parasitic diseases: Z86.19

## 2018-02-26 HISTORY — DX: Personal history of other medical treatment: Z92.89

## 2018-02-26 HISTORY — DX: Unspecified diastolic (congestive) heart failure: I50.30

## 2018-02-26 HISTORY — DX: Paroxysmal atrial fibrillation: I48.0

## 2018-02-26 LAB — PROTIME-INR
INR: 1.02
PROTHROMBIN TIME: 13.3 s (ref 11.4–15.2)

## 2018-02-26 LAB — TYPE AND SCREEN
ABO/RH(D): A POS
Antibody Screen: NEGATIVE

## 2018-02-26 SURGERY — ARTHROPLASTY, KNEE, TOTAL
Anesthesia: Spinal | Site: Knee | Laterality: Left

## 2018-02-26 MED ORDER — SODIUM CHLORIDE (PF) 0.9 % IJ SOLN
INTRAMUSCULAR | Status: AC
  Filled 2018-02-26: qty 50

## 2018-02-26 MED ORDER — VITAMIN D 25 MCG (1000 UNIT) PO TABS
5000.0000 [IU] | ORAL_TABLET | Freq: Every day | ORAL | Status: DC
  Administered 2018-02-26 – 2018-03-02 (×5): 5000 [IU] via ORAL
  Filled 2018-02-26 (×3): qty 5

## 2018-02-26 MED ORDER — LEVOTHYROXINE SODIUM 150 MCG PO TABS
150.0000 ug | ORAL_TABLET | Freq: Every day | ORAL | Status: DC
  Administered 2018-02-27 – 2018-03-02 (×4): 150 ug via ORAL
  Filled 2018-02-26: qty 1
  Filled 2018-02-26: qty 3
  Filled 2018-02-26: qty 1
  Filled 2018-02-26 (×2): qty 3
  Filled 2018-02-26 (×2): qty 1
  Filled 2018-02-26: qty 3

## 2018-02-26 MED ORDER — BUPIVACAINE-EPINEPHRINE (PF) 0.5% -1:200000 IJ SOLN
INTRAMUSCULAR | Status: DC | PRN
  Administered 2018-02-26: 30 mL via PERINEURAL

## 2018-02-26 MED ORDER — MAGNESIUM HYDROXIDE 400 MG/5ML PO SUSP
30.0000 mL | Freq: Every day | ORAL | Status: DC | PRN
  Administered 2018-02-27: 30 mL via ORAL
  Filled 2018-02-26 (×3): qty 30

## 2018-02-26 MED ORDER — VITAMIN C 500 MG PO TABS
250.0000 mg | ORAL_TABLET | Freq: Every day | ORAL | Status: DC
  Administered 2018-02-27 – 2018-03-02 (×4): 250 mg via ORAL
  Filled 2018-02-26 (×4): qty 1

## 2018-02-26 MED ORDER — DOCUSATE SODIUM 100 MG PO CAPS: 100.0000 mg | ORAL_CAPSULE | Freq: Every day | ORAL | Status: DC | PRN

## 2018-02-26 MED ORDER — DIPHENHYDRAMINE HCL 12.5 MG/5ML PO ELIX: 12.5000 mg | ORAL_SOLUTION | ORAL | Status: DC | PRN

## 2018-02-26 MED ORDER — SODIUM CHLORIDE 0.9 % IV SOLN
INTRAVENOUS | Status: DC
  Administered 2018-02-26 – 2018-02-27 (×2): via INTRAVENOUS

## 2018-02-26 MED ORDER — BUPIVACAINE HCL (PF) 0.5 % IJ SOLN
INTRAMUSCULAR | Status: AC
  Filled 2018-02-26: qty 10

## 2018-02-26 MED ORDER — CLINDAMYCIN PHOSPHATE 600 MG/50ML IV SOLN
600.0000 mg | Freq: Four times a day (QID) | INTRAVENOUS | Status: AC
  Administered 2018-02-26 – 2018-02-27 (×3): 600 mg via INTRAVENOUS
  Filled 2018-02-26 (×3): qty 50

## 2018-02-26 MED ORDER — FENTANYL CITRATE (PF) 100 MCG/2ML IJ SOLN
25.0000 ug | INTRAMUSCULAR | Status: DC | PRN
  Administered 2018-02-26: 25 ug via INTRAVENOUS

## 2018-02-26 MED ORDER — ROSUVASTATIN CALCIUM 10 MG PO TABS
10.0000 mg | ORAL_TABLET | Freq: Every day | ORAL | Status: DC
  Administered 2018-02-26 – 2018-03-02 (×5): 10 mg via ORAL
  Filled 2018-02-26 (×5): qty 1

## 2018-02-26 MED ORDER — BOOST PO LIQD
237.0000 mL | Freq: Every day | ORAL | Status: DC
  Administered 2018-02-27: 237 mL via ORAL
  Filled 2018-02-26: qty 237

## 2018-02-26 MED ORDER — BUPIVACAINE HCL (PF) 0.5 % IJ SOLN
INTRAMUSCULAR | Status: DC | PRN
  Administered 2018-02-26: 3 mL

## 2018-02-26 MED ORDER — NEOMYCIN-POLYMYXIN B GU 40-200000 IR SOLN
Status: DC | PRN
  Administered 2018-02-26: 14 mL

## 2018-02-26 MED ORDER — LIDOCAINE HCL (CARDIAC) PF 100 MG/5ML IV SOSY
PREFILLED_SYRINGE | INTRAVENOUS | Status: DC | PRN
  Administered 2018-02-26: 50 mg via INTRAVENOUS

## 2018-02-26 MED ORDER — METOCLOPRAMIDE HCL 5 MG/ML IJ SOLN: 5.0000 mg | Freq: Three times a day (TID) | INTRAMUSCULAR | Status: DC | PRN

## 2018-02-26 MED ORDER — HYDROMORPHONE HCL 1 MG/ML IJ SOLN: 0.2500 mg | INTRAMUSCULAR | Status: DC | PRN

## 2018-02-26 MED ORDER — TRANEXAMIC ACID 1000 MG/10ML IV SOLN
INTRAVENOUS | Status: DC | PRN
  Administered 2018-02-26: 1000 mg via TOPICAL

## 2018-02-26 MED ORDER — PROPOFOL 500 MG/50ML IV EMUL
INTRAVENOUS | Status: DC | PRN
  Administered 2018-02-26: 50 ug/kg/min via INTRAVENOUS

## 2018-02-26 MED ORDER — LISINOPRIL 10 MG PO TABS
10.0000 mg | ORAL_TABLET | Freq: Every day | ORAL | Status: DC
  Administered 2018-02-26 – 2018-03-01 (×3): 10 mg via ORAL
  Filled 2018-02-26 (×3): qty 1

## 2018-02-26 MED ORDER — ONDANSETRON HCL 4 MG/2ML IJ SOLN
4.0000 mg | Freq: Four times a day (QID) | INTRAMUSCULAR | Status: DC | PRN
  Administered 2018-02-26: 4 mg via INTRAVENOUS
  Filled 2018-02-26: qty 2

## 2018-02-26 MED ORDER — INFLUENZA VAC SPLIT HIGH-DOSE 0.5 ML IM SUSY
0.5000 mL | PREFILLED_SYRINGE | INTRAMUSCULAR | Status: DC
  Filled 2018-02-26: qty 0.5

## 2018-02-26 MED ORDER — FENTANYL CITRATE (PF) 100 MCG/2ML IJ SOLN
INTRAMUSCULAR | Status: DC | PRN
  Administered 2018-02-26: 50 ug via INTRAVENOUS

## 2018-02-26 MED ORDER — ACETAMINOPHEN 500 MG PO TABS
1000.0000 mg | ORAL_TABLET | Freq: Four times a day (QID) | ORAL | Status: AC
  Administered 2018-02-26 – 2018-02-27 (×4): 1000 mg via ORAL
  Filled 2018-02-26 (×4): qty 2

## 2018-02-26 MED ORDER — CALCIUM CARBONATE-VITAMIN D 500-200 MG-UNIT PO TABS
1.0000 | ORAL_TABLET | Freq: Every day | ORAL | Status: DC
  Administered 2018-02-27 – 2018-03-02 (×3): 1 via ORAL
  Filled 2018-02-26 (×4): qty 1

## 2018-02-26 MED ORDER — OXYCODONE HCL 5 MG PO TABS
5.0000 mg | ORAL_TABLET | ORAL | Status: DC | PRN
  Administered 2018-02-26 – 2018-02-27 (×4): 5 mg via ORAL
  Administered 2018-02-27: 10 mg via ORAL
  Administered 2018-02-28: 5 mg via ORAL
  Administered 2018-02-28: 10 mg via ORAL
  Filled 2018-02-26: qty 1
  Filled 2018-02-26 (×2): qty 2
  Filled 2018-02-26 (×4): qty 1

## 2018-02-26 MED ORDER — ACETAMINOPHEN 325 MG PO TABS: 325.0000 mg | ORAL_TABLET | Freq: Four times a day (QID) | ORAL | Status: DC | PRN

## 2018-02-26 MED ORDER — LIDOCAINE HCL (PF) 2 % IJ SOLN
INTRAMUSCULAR | Status: AC
  Filled 2018-02-26: qty 10

## 2018-02-26 MED ORDER — PROPOFOL 500 MG/50ML IV EMUL
INTRAVENOUS | Status: AC
  Filled 2018-02-26: qty 50

## 2018-02-26 MED ORDER — FERROUS SULFATE 325 (65 FE) MG PO TABS
325.0000 mg | ORAL_TABLET | Freq: Every day | ORAL | Status: DC
  Administered 2018-02-27 – 2018-03-02 (×4): 325 mg via ORAL
  Filled 2018-02-26 (×4): qty 1

## 2018-02-26 MED ORDER — ADULT MULTIVITAMIN W/MINERALS CH
1.0000 | ORAL_TABLET | Freq: Every day | ORAL | Status: DC
  Administered 2018-02-27 – 2018-03-02 (×4): 1 via ORAL
  Filled 2018-02-26 (×4): qty 1

## 2018-02-26 MED ORDER — IRON-VITAMIN C 65-125 MG PO TABS: 1.0000 | ORAL_TABLET | Freq: Every day | ORAL | Status: DC

## 2018-02-26 MED ORDER — POTASSIUM CHLORIDE CRYS ER 10 MEQ PO TBCR
10.0000 meq | EXTENDED_RELEASE_TABLET | Freq: Two times a day (BID) | ORAL | Status: DC
  Administered 2018-02-26 – 2018-03-02 (×9): 10 meq via ORAL
  Filled 2018-02-26 (×9): qty 1

## 2018-02-26 MED ORDER — MIDAZOLAM HCL 5 MG/5ML IJ SOLN
INTRAMUSCULAR | Status: DC | PRN
  Administered 2018-02-26: .5 mg via INTRAVENOUS

## 2018-02-26 MED ORDER — BUPIVACAINE-EPINEPHRINE (PF) 0.5% -1:200000 IJ SOLN
INTRAMUSCULAR | Status: AC
  Filled 2018-02-26: qty 30

## 2018-02-26 MED ORDER — GLYCOPYRROLATE 0.2 MG/ML IJ SOLN
INTRAMUSCULAR | Status: DC | PRN
  Administered 2018-02-26: 0.1 mg via INTRAVENOUS

## 2018-02-26 MED ORDER — SODIUM CHLORIDE 0.9 % IV SOLN
INTRAVENOUS | Status: DC | PRN
  Administered 2018-02-26: 60 mL

## 2018-02-26 MED ORDER — PHENYLEPHRINE HCL 10 MG/ML IJ SOLN
INTRAMUSCULAR | Status: DC | PRN
  Administered 2018-02-26: 100 ug via INTRAVENOUS

## 2018-02-26 MED ORDER — FENTANYL CITRATE (PF) 100 MCG/2ML IJ SOLN
INTRAMUSCULAR | Status: AC
  Filled 2018-02-26: qty 2

## 2018-02-26 MED ORDER — TORSEMIDE 20 MG PO TABS
20.0000 mg | ORAL_TABLET | Freq: Every day | ORAL | Status: DC
  Administered 2018-02-26 – 2018-03-02 (×3): 20 mg via ORAL
  Filled 2018-02-26 (×3): qty 1

## 2018-02-26 MED ORDER — ASPIRIN EC 81 MG PO TBEC
81.0000 mg | DELAYED_RELEASE_TABLET | Freq: Every day | ORAL | Status: DC
  Administered 2018-02-26 – 2018-03-01 (×4): 81 mg via ORAL
  Filled 2018-02-26 (×4): qty 1

## 2018-02-26 MED ORDER — GLYCOPYRROLATE 0.2 MG/ML IJ SOLN
INTRAMUSCULAR | Status: AC
  Filled 2018-02-26: qty 1

## 2018-02-26 MED ORDER — FLEET ENEMA 7-19 GM/118ML RE ENEM: 1.0000 | ENEMA | Freq: Once | RECTAL | Status: DC | PRN

## 2018-02-26 MED ORDER — METOCLOPRAMIDE HCL 10 MG PO TABS: 5.0000 mg | ORAL_TABLET | Freq: Three times a day (TID) | ORAL | Status: DC | PRN

## 2018-02-26 MED ORDER — AMIODARONE HCL 200 MG PO TABS
200.0000 mg | ORAL_TABLET | Freq: Every day | ORAL | Status: DC
  Administered 2018-02-28 – 2018-03-01 (×2): 200 mg via ORAL
  Filled 2018-02-26 (×3): qty 1

## 2018-02-26 MED ORDER — ONDANSETRON HCL 4 MG PO TABS: 4.0000 mg | ORAL_TABLET | Freq: Four times a day (QID) | ORAL | Status: DC | PRN

## 2018-02-26 MED ORDER — FAMOTIDINE 20 MG PO TABS
20.0000 mg | ORAL_TABLET | Freq: Two times a day (BID) | ORAL | Status: DC
  Administered 2018-02-26 – 2018-02-27 (×4): 20 mg via ORAL
  Filled 2018-02-26 (×4): qty 1

## 2018-02-26 MED ORDER — ENOXAPARIN SODIUM 40 MG/0.4ML ~~LOC~~ SOLN: 40.0000 mg | SUBCUTANEOUS | Status: DC

## 2018-02-26 MED ORDER — NEOMYCIN-POLYMYXIN B GU 40-200000 IR SOLN
Status: AC
  Filled 2018-02-26: qty 20

## 2018-02-26 MED ORDER — SPIRONOLACTONE 25 MG PO TABS
25.0000 mg | ORAL_TABLET | Freq: Every day | ORAL | Status: DC
  Administered 2018-02-26 – 2018-03-02 (×3): 25 mg via ORAL
  Filled 2018-02-26 (×3): qty 1

## 2018-02-26 MED ORDER — BISACODYL 10 MG RE SUPP: 10.0000 mg | Freq: Every day | RECTAL | Status: DC | PRN

## 2018-02-26 MED ORDER — PROPOFOL 10 MG/ML IV BOLUS
INTRAVENOUS | Status: DC | PRN
  Administered 2018-02-26 (×2): 11 mg via INTRAVENOUS

## 2018-02-26 MED ORDER — BUPIVACAINE LIPOSOME 1.3 % IJ SUSP
INTRAMUSCULAR | Status: AC
  Filled 2018-02-26: qty 20

## 2018-02-26 MED ORDER — LACTATED RINGERS IV SOLN
INTRAVENOUS | Status: DC
  Administered 2018-02-26: 1000 mL via INTRAVENOUS
  Administered 2018-02-26: 09:00:00 via INTRAVENOUS

## 2018-02-26 MED ORDER — DOCUSATE SODIUM 100 MG PO CAPS
100.0000 mg | ORAL_CAPSULE | Freq: Two times a day (BID) | ORAL | Status: DC
  Administered 2018-02-26 – 2018-03-02 (×9): 100 mg via ORAL
  Filled 2018-02-26 (×9): qty 1

## 2018-02-26 MED ORDER — MIDAZOLAM HCL 2 MG/2ML IJ SOLN
INTRAMUSCULAR | Status: AC
  Filled 2018-02-26: qty 2

## 2018-02-26 MED ORDER — CLINDAMYCIN PHOSPHATE 900 MG/50ML IV SOLN
INTRAVENOUS | Status: AC
  Filled 2018-02-26: qty 50

## 2018-02-26 MED ORDER — TRAMADOL HCL 50 MG PO TABS
50.0000 mg | ORAL_TABLET | Freq: Four times a day (QID) | ORAL | Status: DC | PRN
  Administered 2018-02-27 – 2018-03-01 (×4): 50 mg via ORAL
  Filled 2018-02-26 (×4): qty 1

## 2018-02-26 MED ORDER — SODIUM CHLORIDE 0.9 % IV SOLN
INTRAVENOUS | Status: DC | PRN
  Administered 2018-02-26: 30 ug/min via INTRAVENOUS

## 2018-02-26 MED ORDER — PANTOPRAZOLE SODIUM 40 MG PO TBEC
40.0000 mg | DELAYED_RELEASE_TABLET | Freq: Every day | ORAL | Status: DC
  Administered 2018-02-27 – 2018-03-02 (×4): 40 mg via ORAL
  Filled 2018-02-26 (×4): qty 1

## 2018-02-26 SURGICAL SUPPLY — 63 items
BANDAGE ELASTIC 6 LF NS (GAUZE/BANDAGES/DRESSINGS) ×2 IMPLANT
BLADE SAW SAG 25X90X1.19 (BLADE) ×2 IMPLANT
BLADE SURG SZ20 CARB STEEL (BLADE) ×2 IMPLANT
BNDG CMPR MED 5X6 ELC HKLP NS (GAUZE/BANDAGES/DRESSINGS) ×1
BRNG TIB 71X10 ANT STAB KN (Knees) ×1 IMPLANT
CANISTER SUCT 1200ML W/VALVE (MISCELLANEOUS) ×2 IMPLANT
CANISTER SUCT 3000ML PPV (MISCELLANEOUS) ×2 IMPLANT
CEMENT BONE R 1X40 (Cement) ×4 IMPLANT
CEMENT VACUUM MIXING SYSTEM (MISCELLANEOUS) ×2 IMPLANT
CHLORAPREP W/TINT 26ML (MISCELLANEOUS) ×2 IMPLANT
COMP FEMORAL CRUC LEFT 67.5MM (Joint) ×2 IMPLANT
COMPONENT FEMRL CRUC LT 67.5MM (Joint) IMPLANT
COOLER POLAR GLACIER W/PUMP (MISCELLANEOUS) ×2 IMPLANT
COVER MAYO STAND STRL (DRAPES) ×2 IMPLANT
COVER WAND RF STERILE (DRAPES) ×1 IMPLANT
CUFF TOURN 24 STER (MISCELLANEOUS) IMPLANT
CUFF TOURN 30 STER DUAL PORT (MISCELLANEOUS) IMPLANT
DRAPE IMP U-DRAPE 54X76 (DRAPES) ×2 IMPLANT
DRAPE INCISE IOBAN 66X45 STRL (DRAPES) ×2 IMPLANT
DRAPE SHEET LG 3/4 BI-LAMINATE (DRAPES) ×2 IMPLANT
DRSG OPSITE POSTOP 4X10 (GAUZE/BANDAGES/DRESSINGS) ×2 IMPLANT
DRSG OPSITE POSTOP 4X8 (GAUZE/BANDAGES/DRESSINGS) ×2 IMPLANT
ELECT CAUTERY BLADE 6.4 (BLADE) ×2 IMPLANT
ELECT REM PT RETURN 9FT ADLT (ELECTROSURGICAL) ×2
ELECTRODE REM PT RTRN 9FT ADLT (ELECTROSURGICAL) ×1 IMPLANT
GLOVE BIO SURGEON STRL SZ7.5 (GLOVE) ×8 IMPLANT
GLOVE BIO SURGEON STRL SZ8 (GLOVE) ×8 IMPLANT
GLOVE BIOGEL PI IND STRL 8 (GLOVE) ×1 IMPLANT
GLOVE BIOGEL PI INDICATOR 8 (GLOVE) ×1
GLOVE INDICATOR 8.0 STRL GRN (GLOVE) ×2 IMPLANT
GOWN STRL REUS W/ TWL LRG LVL3 (GOWN DISPOSABLE) ×1 IMPLANT
GOWN STRL REUS W/ TWL XL LVL3 (GOWN DISPOSABLE) ×1 IMPLANT
GOWN STRL REUS W/TWL LRG LVL3 (GOWN DISPOSABLE) ×2
GOWN STRL REUS W/TWL XL LVL3 (GOWN DISPOSABLE) ×2
HOLDER FOLEY CATH W/STRAP (MISCELLANEOUS) ×2 IMPLANT
HOOD PEEL AWAY FLYTE STAYCOOL (MISCELLANEOUS) ×7 IMPLANT
IMMBOLIZER KNEE 19 BLUE UNIV (SOFTGOODS) ×2 IMPLANT
KIT TURNOVER KIT A (KITS) ×2 IMPLANT
KNEE TIBIAL BEAR 10MM 71MM (Knees) ×1 IMPLANT
NDL SAFETY ECLIPSE 18X1.5 (NEEDLE) ×2 IMPLANT
NDL SPNL 20GX3.5 QUINCKE YW (NEEDLE) ×1 IMPLANT
NEEDLE HYPO 18GX1.5 SHARP (NEEDLE) ×4
NEEDLE SPNL 20GX3.5 QUINCKE YW (NEEDLE) ×2 IMPLANT
NS IRRIG 1000ML POUR BTL (IV SOLUTION) ×2 IMPLANT
PACK TOTAL KNEE (MISCELLANEOUS) ×2 IMPLANT
PAD WRAPON POLAR KNEE (MISCELLANEOUS) ×1 IMPLANT
PLATE KNEE TIBIAL 71MM FIXED (Plate) ×1 IMPLANT
PULSAVAC PLUS IRRIG FAN TIP (DISPOSABLE) ×2
SOL .9 NS 3000ML IRR  AL (IV SOLUTION) ×1
SOL .9 NS 3000ML IRR AL (IV SOLUTION) ×1
SOL .9 NS 3000ML IRR UROMATIC (IV SOLUTION) ×1 IMPLANT
STAPLER SKIN PROX 35W (STAPLE) ×2 IMPLANT
SUCTION FRAZIER HANDLE 10FR (MISCELLANEOUS) ×1
SUCTION TUBE FRAZIER 10FR DISP (MISCELLANEOUS) ×1 IMPLANT
SUT VIC AB 0 CT1 36 (SUTURE) ×6 IMPLANT
SUT VIC AB 2-0 CT1 27 (SUTURE) ×6
SUT VIC AB 2-0 CT1 TAPERPNT 27 (SUTURE) ×3 IMPLANT
SYR 10ML LL (SYRINGE) ×2 IMPLANT
SYR 20CC LL (SYRINGE) ×2 IMPLANT
SYR 30ML LL (SYRINGE) ×6 IMPLANT
TIP FAN IRRIG PULSAVAC PLUS (DISPOSABLE) ×1 IMPLANT
TRAY FOLEY MTR SLVR 16FR STAT (SET/KITS/TRAYS/PACK) ×2 IMPLANT
WRAPON POLAR PAD KNEE (MISCELLANEOUS) ×2

## 2018-02-26 NOTE — Care Management Note (Addendum)
Case Management Note  Patient Details  Name: [name] MRN: [id number] Date of Birth: [birth year]  Subjective/Objective:                  RNCM met with patient to discuss transition of care. She plans to do rehab at whatever level of care she needs.  She has a front-wheeled walker and she has a cane available at home. She is not clear is she will need a bedside commode. Her husband is independent at home with her and able to drive. She states at baseline she also is independent. She uses [facility] for mediations.   Action/Plan: Home health list with private duty agencies provided to patient for review.   Expected Discharge Date:                  Expected Discharge Plan:     In-House Referral:     Discharge planning Services     Post Acute Care Choice:    Choice offered to:     DME Arranged:    DME Agency:     HH Arranged:    HH Agency:     Status of Service:     If discussed at [employer], dates discussed:    Additional Comments:  Tanis Hensarling, RN 02/26/2018, 1:27 PM

## 2018-02-26 NOTE — Anesthesia Procedure Notes (Signed)
Spinal  Patient location during procedure: OR Start time: 02/26/2018 7:37 AM End time: 02/26/2018 7:40 AM Staffing Resident/CRNA: Bernardo Heater, CRNA Performed: resident/CRNA  Preanesthetic Checklist Completed: patient identified, site marked, surgical consent, pre-op evaluation, timeout performed, IV checked, risks and benefits discussed and monitors and equipment checked Spinal Block Patient position: sitting Prep: ChloraPrep Patient monitoring: heart rate, continuous pulse ox, blood pressure and cardiac monitor Approach: midline Location: L2-3 Injection technique: single-shot Needle Needle type: Introducer and Pencan  Needle gauge: 24 G Needle length: 9 cm Additional Notes Negative paresthesia. Negative blood return. Positive free-flowing CSF. Expiration date of kit checked and confirmed. Patient tolerated procedure well, without complications.

## 2018-02-26 NOTE — Anesthesia Post-op Follow-up Note (Signed)
Anesthesia QCDR form completed.        

## 2018-02-26 NOTE — Transfer of Care (Signed)
Immediate Anesthesia Transfer of Care Note  Patient: [name]  Procedure(s) Performed: TOTAL KNEE ARTHROPLASTY (Left Knee)  Patient Location: PACU  Anesthesia Type:Spinal  Level of Consciousness: awake, alert , oriented and patient cooperative  Airway & Oxygen Therapy: Patient Spontanous Breathing and Patient connected to nasal cannula oxygen  Post-op Assessment: Report given to RN and Post -op Vital signs reviewed and stable  Post vital signs: Reviewed and stable  Last Vitals:  Vitals Value Taken Time  BP    Temp    Pulse 81 02/26/2018  9:58 AM  Resp 14 02/26/2018  9:58 AM  SpO2 100 % 02/26/2018  9:58 AM  Vitals shown include unvalidated device data.  Last Pain:  Vitals:   02/26/18 0629  TempSrc: Oral  PainSc: 0-No pain         Complications: No apparent anesthesia complications

## 2018-02-26 NOTE — NC FL2 (Addendum)
[place] LEVEL OF CARE SCREENING TOOL     IDENTIFICATION  Patient Name: [name] Birthdate: [birth year] Sex: female Admission Date (Current Location): 02/26/2018  [place] and [place] Number:  Engineering geologist and Address:  [facility], [address] [place], [place] [address]      Provider Number: [id number]  Attending Physician Name and Address:  Corky Mull, MD  Relative Name and Phone Number:       Current Level of Care: Hospital Recommended Level of Care: [place] Prior Approval Number:    Date Approved/Denied:   PASRR Number: [id number])  Discharge Plan: SNF    Current Diagnoses: Patient Active Problem List   Diagnosis Date Noted  . Status post total knee replacement using cement, left 02/26/2018  . Acute lower UTI 07/20/2017  . Acute renal failure (ARF) ([place]) 07/20/2017  . Orthostatic hypotension 01/14/2016  . ARF (acute renal failure) ([place]) 01/14/2016  . Dehydration 01/14/2016  . Diarrhea 01/14/2016  . Pressure injury of skin 01/12/2016  . Protein-calorie malnutrition, severe 01/12/2016  . 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 PAD (peripheral artery disease) ([place]) 08/10/2014  . Claudication ([place]) 06/30/2014  . Chronic diastolic congestive heart failure ([place]) 02/26/2014  . Edema 07/28/2013  . Preop cardiovascular exam 07/28/2013  . Renal insufficiency 09/14/2011  . PAF (paroxysmal atrial fibrillation) ([place]) 08/09/2010  . Hypertension 08/09/2010  . Chest pain 08/09/2010    Orientation RESPIRATION BLADDER Height & Weight     Self, Time, Situation, Place  Normal Continent Weight: 125 lb (56.7 kg) Height:  5\' 8"  (172.7 cm)  BEHAVIORAL SYMPTOMS/MOOD NEUROLOGICAL BOWEL NUTRITION STATUS      Continent Diet(Diet: Heart Healthy )  AMBULATORY STATUS COMMUNICATION OF NEEDS Skin   Extensive Assist Verbally Surgical wounds(Incision: Left Knee. )                        Personal Care Assistance Level of Assistance  Bathing, Feeding, Dressing Bathing Assistance: Limited assistance Feeding assistance: Independent Dressing Assistance: Limited assistance     Functional Limitations Info  Sight, Hearing, Speech Sight Info: Adequate Hearing Info: Adequate Speech Info: Adequate    SPECIAL CARE FACTORS FREQUENCY  PT (By licensed PT), OT (By licensed OT)     PT Frequency: (5) OT Frequency: (5)            Contractures      Additional Factors Info  Code Status, Allergies Code Status Info: (Full Code. ) Allergies Info: (Other, Amlodipine, Ciprofloxacin, Doxycycline, Lipitor Atorvastatin, Morphine And Related, Macrobid Nitrofurantoin Macrocrystal, Penicillins, Sulfa Antibiotics)           Current Medications (02/26/2018):  This is the current hospital active medication list Current Facility-Administered Medications  Medication Dose Route Frequency Provider Last Rate Last Dose  . 0.9 %  sodium chloride infusion   Intravenous Continuous Poggi, Marshall Cork, MD 50 mL/hr at 02/26/18 1310    . acetaminophen (TYLENOL) tablet 1,000 mg  1,000 mg Oral Q6H Poggi, Marshall Cork, MD   1,000 mg at 02/26/18 1324  . [START ON 02/27/2018] acetaminophen (TYLENOL) tablet 325-650 mg  325-650 mg Oral Q6H PRN Poggi, Marshall Cork, MD      . Derrill Memo ON 02/27/2018] amiodarone (PACERONE) tablet 200 mg  200 mg Oral Daily Poggi, Marshall Cork, MD      . aspirin EC tablet 81 mg  81 mg Oral Daily Poggi, Marshall Cork, MD   [id number]  mg at 02/26/18 1325  . bisacodyl (DULCOLAX) suppository 10 mg  10 mg Rectal Daily PRN Poggi, Marshall Cork, MD      . Derrill Memo ON 02/27/2018] calcium-vitamin D (OSCAL WITH D) 500-200 MG-UNIT per tablet 1 tablet  1 tablet Oral Q breakfast Poggi, Marshall Cork, MD      . cholecalciferol (VITAMIN D3) tablet 5,000 Units  5,000 Units Oral Daily Poggi, Marshall Cork, MD   5,000 Units at 02/26/18 1324  . clindamycin (CLEOCIN) IVPB 600 mg  600 mg Intravenous Q6H Poggi, Marshall Cork, MD 100 mL/hr at 02/26/18 1406 600 mg at  02/26/18 1406  . diphenhydrAMINE (BENADRYL) 12.5 MG/5ML elixir 12.5-25 mg  12.5-25 mg Oral Q4H PRN Poggi, Marshall Cork, MD      . docusate sodium (COLACE) capsule 100 mg  100 mg Oral Daily PRN Poggi, Marshall Cork, MD      . docusate sodium (COLACE) capsule 100 mg  100 mg Oral BID Poggi, Marshall Cork, MD   100 mg at 02/26/18 1325  . [START ON 02/27/2018] enoxaparin (LOVENOX) injection 40 mg  40 mg Subcutaneous Q24H Poggi, Marshall Cork, MD      . famotidine (PEPCID) tablet 20 mg  20 mg Oral BID Poggi, Marshall Cork, MD   20 mg at 02/26/18 1325  . fentaNYL (SUBLIMAZE) 100 MCG/2ML injection           . [START ON 02/27/2018] ferrous sulfate tablet 325 mg  325 mg Oral Q breakfast Poggi, Marshall Cork, MD      . HYDROmorphone (DILAUDID) injection 0.25-0.5 mg  0.25-0.5 mg Intravenous Q4H PRN Poggi, Marshall Cork, MD      . Derrill Memo ON 02/27/2018] Influenza vac split quadrivalent PF (FLUZONE HIGH-DOSE) injection 0.5 mL  0.5 mL Intramuscular Tomorrow-1000 Poggi, Marshall Cork, MD      . lactose free nutrition (Boost) liquid 237 mL  237 mL Oral Daily Poggi, Marshall Cork, MD      . Derrill Memo ON 02/27/2018] levothyroxine (SYNTHROID, LEVOTHROID) tablet 150 mcg  150 mcg Oral QAC breakfast Poggi, Marshall Cork, MD      . lisinopril (PRINIVIL,ZESTRIL) tablet 10 mg  10 mg Oral Daily Poggi, Marshall Cork, MD   10 mg at 02/26/18 1325  . magnesium hydroxide (MILK OF MAGNESIA) suspension 30 mL  30 mL Oral Daily PRN Poggi, Marshall Cork, MD      . metoCLOPramide (REGLAN) tablet 5-10 mg  5-10 mg Oral Q8H PRN Poggi, Marshall Cork, MD       Or  . metoCLOPramide (REGLAN) injection 5-10 mg  5-10 mg Intravenous Q8H PRN Poggi, Marshall Cork, MD      . multivitamin with minerals tablet 1 tablet  1 tablet Oral Daily Poggi, Marshall Cork, MD      . ondansetron (ZOFRAN) tablet 4 mg  4 mg Oral Q6H PRN Poggi, Marshall Cork, MD       Or  . ondansetron (ZOFRAN) injection 4 mg  4 mg Intravenous Q6H PRN Poggi, Marshall Cork, MD      . oxyCODONE (Oxy IR/ROXICODONE) immediate release tablet 5-10 mg  5-10 mg Oral Q4H PRN Poggi, Marshall Cork, MD      . Derrill Memo  ON 02/27/2018] pantoprazole (PROTONIX) EC tablet 40 mg  40 mg Oral Daily Poggi, Marshall Cork, MD      . potassium chloride (K-DUR,KLOR-CON) CR tablet 10 mEq  10 mEq Oral BID Poggi, Marshall Cork, MD   10 mEq at 02/26/18 1325  . rosuvastatin (CRESTOR) tablet 10 mg  10 mg Oral  Daily Poggi, Marshall Cork, MD   10 mg at 02/26/18 1325  . sodium phosphate (FLEET) 7-19 GM/118ML enema 1 enema  1 enema Rectal Once PRN Poggi, Marshall Cork, MD      . spironolactone (ALDACTONE) tablet 25 mg  25 mg Oral Daily Poggi, Marshall Cork, MD   25 mg at 02/26/18 1325  . torsemide (DEMADEX) tablet 20 mg  20 mg Oral Daily Poggi, Marshall Cork, MD   20 mg at 02/26/18 1325  . traMADol (ULTRAM) tablet 50 mg  50 mg Oral Q6H PRN Poggi, Marshall Cork, MD      . Derrill Memo ON 02/27/2018] vitamin C (ASCORBIC ACID) tablet 250 mg  250 mg Oral Daily Poggi, Marshall Cork, MD         Discharge Medications: Please see discharge summary for a list of discharge medications.  Relevant Imaging Results:  Relevant Lab Results:   Additional Information (SSN: [id number])  Herson Prichard, Veronia Beets, LCSW

## 2018-02-26 NOTE — H&P (Signed)
Paper H&P to be scanned into permanent record. H&P reviewed and patient re-examined. No changes. 

## 2018-02-26 NOTE — Op Note (Signed)
02/26/2018  10:08 AM  Patient:   [name]  Pre-Op Diagnosis:   Degenerative joint disease, left knee.  Post-Op Diagnosis:   Same  Procedure:   Left TKA using all-cemented Biomet Vanguard system with a 67.5 mm mm PCR femur and a 71 mm tibial tray with a 10 mm AS E-poly insert.  Surgeon:   Pascal Lux, MD  Assistant:   Cameron Proud, PA-C; Phoebe Sharps, PA-S  Anesthesia:   Spinal  Findings:   As above  Complications:   None  EBL:   5 cc  Fluids:   1100 cc crystalloid  UOP:   300 cc  TT:   83 minutes at 300 mmHg  Drains:   None  Closure:   Staples  Implants:   As above  Brief Clinical Note:   The patient is an 84 year old female with a long history of progressively worsening left knee pain. The patient's symptoms have progressed despite medications, activity modification, injections, etc. The patient's history and examination were consistent with advanced degenerative joint disease of the right knee confirmed by plain radiographs. The patient presents at this time for a left total knee arthroplasty.  Procedure:   The patient was brought into the operating room. After adequate spinal anesthesia was obtained, the patient was lain in the supine position. A Foley catheter was placed by the nurse before the right lower extremity was prepped with ChloraPrep solution and draped sterilely. Preoperative antibiotics were administered. After verifying the proper laterality with a surgical timeout, the limb was exsanguinated with an Esmarch and the tourniquet inflated to 300 mmHg. A standard anterior approach to the knee was made through an approximately 7 inch incision. The incision was carried down through the subcutaneous tissues to expose superficial retinaculum. This was split the length of the incision and the medial flap elevated sufficiently to expose the medial retinaculum. The medial retinaculum was incised, leaving a 3-4 mm cuff of tissue on the patella. This was extended  distally along the medial border of the patellar tendon and proximally through the medial third of the quadriceps tendon. A subtotal fat pad excision was performed before the soft tissues were elevated off the anteromedial and anterolateral aspects of the proximal tibia to the level of the collateral ligaments. The anterior portions of the medial and lateral menisci were removed, as was the anterior cruciate ligament. With the knee flexed to 90, the external tibial guide was positioned and the appropriate proximal tibial cut made. This piece was taken to the back table where it was measured and found to be optimally replicated by a 71 mm component.  Attention was directed to the distal femur. The intramedullary canal was accessed through a 3/8" drill hole. The intramedullary guide was inserted and positioned in order to obtain a neutral flexion gap. The intercondylar block was positioned with care taken to avoid notching the anterior cortex of the femur. The appropriate cut was made. Next, the distal cutting block was placed at 6 of valgus alignment. Using the 9 mm slot, the distal cut was made. The distal femur was measured and found to be optimally replicated by the 68.1 mm component. The 67.5 mm 4-in-1 cutting block was positioned and first the posterior, then the posterior chamfer, the anterior chamfer, and finally the anterior cuts were made. At this point, the posterior portions medial and lateral menisci were removed. A trial reduction was performed using the appropriate femoral and tibial components with the 10 mm insert. This demonstrated excellent  stability to varus and valgus stressing both in flexion and extension while permitting full extension. Patella tracking was assessed and found to be excellent. Therefore, the tibial guide position was marked on the proximal tibia. The patella thickness was measured and found to be 17 mm. Therefore, it was elected not to resurface the patella, especially given  that the patella articular cartilage was in reasonably satisfactory condition. The lug holes were drilled into the distal femur before the trial component was removed, leaving only the tibial tray. The keel was then created using the appropriate tower, reamer, and punch.  The bony surfaces were prepared for cementing by irrigating them thoroughly with bacitracin saline solution using the jet lavage system. A bone plug was fashioned from some of the bone that had been removed previously and used to plug the distal femoral canal. In addition, 20 cc of Exparel diluted out to 60 cc with normal saline and 30 cc of 0.5% Sensorcaine were injected into the postero-medial and postero-lateral aspects of the knee, the medial and lateral gutter regions, and the peri-incisional tissues to help with postoperative analgesia. Meanwhile, the cement was being mixed on the back table. When it was ready, the tibial tray was cemented in first. The excess cement was removed using [employer] fast streamer. Next, the femoral component was impacted into place. Again, the excess cement was removed using [employer] fast streamer. The 10 mm trial insert was positioned and the knee brought into extension while the cement hardened. Once the cement had hardened, the knee was placed through a range of motion with the findings as described above. Therefore, the trial insert was removed and, after verifying that no cement had been retained posteriorly, the permanent 10 mm anterior stabilized E-polyethylene insert was positioned and secured using the appropriate key locking mechanism. Again the knee was placed through a range of motion with the findings as described above.  The wound was copiously irrigated with bacitracin saline solution using the jet lavage system before the quadriceps tendon and retinacular layer were reapproximated using #0 Vicryl interrupted sutures. The superficial retinacular layer also was closed using a running #0 Vicryl suture. A total  of 10 cc of transexemic acid (TXA) was injected intra-articularly before the subcutaneous tissues were closed in several layers using 2-0 Vicryl interrupted sutures. The skin was closed using staples. A sterile honeycomb dressing was applied to the skin before the leg was wrapped with an Ace wrap to accommodate the Polar Care device. The patient was then awakened and returned to the recovery room in satisfactory condition after tolerating the procedure well.

## 2018-02-26 NOTE — Progress Notes (Signed)
   02/26/18 0700  Clinical Encounter Type  Visited With Patient;Family (Husband)  Visit Type Initial;Spiritual support  Recommendations Follow-up, if requested.  Spiritual Encounters  Spiritual Needs Emotional   During the brief encounter, Chaplain assessed the patient's nervousness and concerns. Patient was ready for surgery and Chaplain offered a blessing before ending the encounter.

## 2018-02-26 NOTE — Anesthesia Preprocedure Evaluation (Signed)
Anesthesia Evaluation  Patient identified by MRN, date of birth, ID band Patient awake    Reviewed: Allergy & Precautions, H&P , NPO status , Patient's Chart, lab work & pertinent test results  History of Anesthesia Complications (+) PROLONGED EMERGENCE, Emergence Delirium and history of anesthetic complications  Airway Mallampati: III  TM Distance: <3 FB Neck ROM: limited    Dental  (+) Chipped   Pulmonary shortness of breath, COPD,  COPD inhaler,           Cardiovascular Exercise Tolerance: Good hypertension, (-) angina+ Peripheral Vascular Disease and +CHF  (-) Past MI and (-) DOE + dysrhythmias      Neuro/Psych PSYCHIATRIC DISORDERS negative neurological ROS     GI/Hepatic Neg liver ROS, GERD  Medicated and Controlled,  Endo/Other  negative endocrine ROSHypothyroidism   Renal/GU Renal disease     Musculoskeletal  (+) Arthritis ,   Abdominal   Peds  Hematology negative hematology ROS (+)   Anesthesia Other Findings Past Medical History: No date: Anemia No date: Arthritis     Comment:  "back, hands" (09/08/2015) No date: Atrial fibrillation (HCC)     Comment:  Echocardiogram 5/12: normal LV function, moderate LAE,               mild RAE, mild AI and MR and mild-to-moderate TR.                Myoview 5/ 12 showed an ejection fraction of 73% and               normal perfusion.   Patient underwent elective DCCV on               08/26/10 2007, 2013: Breast cancer [facility])     Comment:  right breast- 2007 radiation-mastectomy No date: Cancer of right breast ([place]) No date: Chronic kidney disease No date: Chronic lower back pain No date: CKD (chronic kidney disease), stage IV (HCC) No date: Complication of anesthesia     Comment:  "took me about 1 week to know what was going on after               one of my knee ORs" No date: COPD (chronic obstructive pulmonary disease) (HCC) No date: DDD (degenerative disc  disease), lumbar No date: Depression 09/01/2015: Ductal carcinoma in situ (DCIS) of right breast No date: Dyspnea     Comment:  DOE No date: Dysrhythmia     Comment:  AFIB No date: Edema     Comment:  FEET/ANKLES No date: GERD (gastroesophageal reflux disease) No date: Hyperlipidemia No date: Hypertension No date: Hypothyroid No date: PAD (peripheral artery disease) (HCC) No date: PVD (peripheral vascular disease) ([place]) No date: Sepsis ([place])     Comment:  UTI/ECOLI I WEEK AGO  Past Surgical History: No date: ABDOMINAL EXPLORATION SURGERY     Comment:  "had to go back in after hysterectomy & check on stitch               dr had put near my bladder; don't know if they took it               out; had to wear catheter for 1 month" No date: ABDOMINAL HYSTERECTOMY No date: APPENDECTOMY No date: BACK SURGERY 09/08/2015: BALLOON ANGIOPLASTY, ARTERY; Right     Comment:  superficial femoral 2007: BREAST EXCISIONAL BIOPSY; Right     Comment:  positive No date: BREAST SURGERY 11/10/2011: CARDIOVERSION     Comment:  Procedure:  CARDIOVERSION;  Surgeon: Lelon Perla,               MD;  [facility]: [facility];  Service: Cardiovascular;                Laterality: N/A; 08/26/2010: CARDIOVERSION 07/17/2017: CATARACT EXTRACTION W/PHACO; Right     Comment:  Procedure: CATARACT EXTRACTION PHACO AND INTRAOCULAR               LENS PLACEMENT ([place]);  Surgeon: Birder Robson, MD;                Location: [facility] ORS;  Service: Ophthalmology;  Laterality:              Right;  [place] 00:44.0 AP% 14.6 CDE 6.44 FLUID PACK LOT #               [id number] 08/07/2017: CATARACT EXTRACTION W/PHACO; Left     Comment:  Procedure: CATARACT EXTRACTION PHACO AND INTRAOCULAR               LENS PLACEMENT (I[facility]);  Surgeon: Birder Robson, MD;                Location: [facility] ORS;  Service: Ophthalmology;  Laterality:              Left;  [place] 00:44 AP% 14.1 CDE 6.32 Fluid pack lot #               [id number] 01/04/2016: COLONOSCOPY  WITH PROPOFOL; N/A     Comment:  Procedure: COLONOSCOPY WITH PROPOFOL;  Surgeon: Lollie Sails, MD;  Location: [facility] ENDOSCOPY;  Service:               Endoscopy;  Laterality: N/A; No date: HALLUX VALGUS CORRECTION No date: JOINT REPLACEMENT No date: LUMBAR [place] [facility] 2013: MASTECTOMY; Right     Comment:  positive 09/08/2015: PERIPHERAL VASCULAR CATHETERIZATION; N/A     Comment:  Procedure: Abdominal Aortogram w/Lower Extremity;                Surgeon: Wellington Hampshire, MD;  Location: [place] CV               LAB;  Service: Cardiovascular;  Laterality: N/A; 09/08/2015: PERIPHERAL VASCULAR CATHETERIZATION     Comment:  Procedure: Peripheral Vascular Balloon Angioplasty;                Surgeon: Wellington Hampshire, MD;  Location: [place] CV               LAB;  Service: Cardiovascular;; No date: REVISION TOTAL KNEE ARTHROPLASTY; Right 2013: rt.ankle ulcer surgery No date: TEMPOROMANDIBULAR JOINT ARTHROPLASTY No date: TMJ ARTHROPLASTY No date: TONSILLECTOMY No date: TOTAL KNEE ARTHROPLASTY; Right No date: VASCULAR SURGERY  BMI    Body Mass Index:  19.01 kg/m      Reproductive/Obstetrics negative OB ROS                             Anesthesia Physical Anesthesia Plan  ASA: III  Anesthesia Plan: Spinal   Post-op Pain Management:    Induction:   PONV Risk Score and Plan:   Airway Management Planned: Natural Airway and Nasal Cannula  Additional Equipment:   Intra-op Plan:   Post-operative Plan:   Informed Consent: I have reviewed the patients History and Physical, chart, labs and discussed the  procedure including the risks, benefits and alternatives for the proposed anesthesia with the patient or authorized representative who has indicated his/her understanding and acceptance.   Dental Advisory Given  Plan Discussed with: Anesthesiologist, CRNA and Surgeon  Anesthesia Plan Comments: (Patient reports no bleeding problems  and no anticoagulant use.  Plan for spinal with backup GA  Patient consented for risks of anesthesia including but not limited to:  - adverse reactions to medications - risk of bleeding, infection, nerve damage and headache - risk of failed spinal - damage to teeth, lips or other oral mucosa - sore throat or hoarseness - Damage to heart, brain, lungs or loss of life  Patient voiced understanding.)        Anesthesia Quick Evaluation

## 2018-02-27 ENCOUNTER — Encounter: Payer: Self-pay | Admitting: Surgery

## 2018-02-27 ENCOUNTER — Encounter
Admission: RE | Admit: 2018-02-27 | Discharge: 2018-02-27 | Disposition: A | Discharge status: Home / Self Care | Payer: Medicare HMO | Source: Ambulatory Visit | Attending: Internal Medicine | Admitting: Internal Medicine

## 2018-02-27 LAB — CBC WITH DIFFERENTIAL/PLATELET
ABS IMMATURE GRANULOCYTES: 0.04 10*3/uL (ref 0.00–0.07)
BASOS ABS: 0 10*3/uL (ref 0.0–0.1)
Basophils Relative: 0 %
EOS PCT: 0 %
Eosinophils Absolute: 0 10*3/uL (ref 0.0–0.5)
HCT: 30.3 % — ABNORMAL LOW (ref 36.0–46.0)
HEMOGLOBIN: 9.7 g/dL — AB (ref 12.0–15.0)
IMMATURE GRANULOCYTES: 1 %
LYMPHS ABS: 0.7 10*3/uL (ref 0.7–4.0)
LYMPHS PCT: 9 %
MCH: 32.4 pg (ref 26.0–34.0)
MCHC: 32 g/dL (ref 30.0–36.0)
MCV: 101.3 fL — ABNORMAL HIGH (ref 80.0–100.0)
Monocytes Absolute: 1.1 10*3/uL — ABNORMAL HIGH (ref 0.1–1.0)
Monocytes Relative: 15 %
NEUTROS ABS: 5.7 10*3/uL (ref 1.7–7.7)
NEUTROS PCT: 75 %
NRBC: 0 % (ref 0.0–0.2)
Platelets: 184 10*3/uL (ref 150–400)
RBC: 2.99 MIL/uL — AB (ref 3.87–5.11)
RDW: 13.2 % (ref 11.5–15.5)
WBC: 7.6 10*3/uL (ref 4.0–10.5)

## 2018-02-27 LAB — BASIC METABOLIC PANEL
ANION GAP: 9 (ref 5–15)
BUN: 31 mg/dL — ABNORMAL HIGH (ref 8–23)
CO2: 25 mmol/L (ref 22–32)
Calcium: 8.7 mg/dL — ABNORMAL LOW (ref 8.9–10.3)
Chloride: 106 mmol/L (ref 98–111)
Creatinine, Ser: 2.07 mg/dL — ABNORMAL HIGH (ref 0.44–1.00)
GFR calc non Af Amer: 21 mL/min — ABNORMAL LOW (ref >60)
GFR, EST AFRICAN AMERICAN: 24 mL/min — AB (ref >60)
Glucose, Bld: 126 mg/dL — ABNORMAL HIGH (ref 70–99)
POTASSIUM: 3.9 mmol/L (ref 3.5–5.1)
SODIUM: 140 mmol/L (ref 135–145)

## 2018-02-27 MED ORDER — TRAMADOL HCL 50 MG PO TABS: 50.0000 mg | ORAL_TABLET | Freq: Four times a day (QID) | ORAL | 0 refills | Status: DC | PRN

## 2018-02-27 MED ORDER — ONDANSETRON HCL 4 MG PO TABS: 4.0000 mg | ORAL_TABLET | Freq: Four times a day (QID) | ORAL | 1 refills | Status: DC | PRN

## 2018-02-27 MED ORDER — ENOXAPARIN SODIUM 30 MG/0.3ML ~~LOC~~ SOLN
30.0000 mg | SUBCUTANEOUS | Status: DC
  Administered 2018-02-27 – 2018-02-28 (×2): 30 mg via SUBCUTANEOUS
  Filled 2018-02-27 (×2): qty 0.3

## 2018-02-27 MED ORDER — ENOXAPARIN SODIUM 30 MG/0.3ML ~~LOC~~ SOLN: 30.0000 mg | SUBCUTANEOUS | 0 refills | Status: DC

## 2018-02-27 MED ORDER — OXYCODONE HCL 5 MG PO TABS: 5.0000 mg | ORAL_TABLET | ORAL | 0 refills | Status: DC | PRN

## 2018-02-27 MED ORDER — ENOXAPARIN SODIUM 40 MG/0.4ML ~~LOC~~ SOLN: 40.0000 mg | SUBCUTANEOUS | 0 refills | Status: DC

## 2018-02-27 NOTE — Progress Notes (Signed)
PHARMACIST - PHYSICIAN COMMUNICATION  CONCERNING:  Enoxaparin (Lovenox) for post total knee DVT Prophylaxis    RECOMMENDATION: Patient was prescribed enoxaprin 40mg  q24 hours for VTE prophylaxis.   Filed Weights   02/26/18 0629  Weight: 125 lb (56.7 kg)    Body mass index is 19.01 kg/m.  Estimated Creatinine Clearance: 18.4 mL/min (A) (by C-G formula based on SCr of 2.07 mg/dL (H)).   Patient is candidate for enoxaparin 30mg  every 24 hours based on CrCl <78ml/min  DESCRIPTION:  Pharmacy has adjusted enoxaparin dose per [place] notification and [facility] policy.  Patient is now receiving enoxaparin 30mg  every 24 hours.  Lu Duffel, PharmD Clinical Pharmacist  02/27/2018 9:03 AM

## 2018-02-27 NOTE — Evaluation (Signed)
Physical Therapy Evaluation Patient Details Name: [name] MRN: [id number] DOB: [birth year] Today's Date: 02/27/2018   History of Present Illness  Pt is 84 yo female s/p L TKA, WBAT. PMH of COPD, SOB, HTN, CHF, CKD, hypothyroidism, afib, breast cx  Clinical Impression  Patient alert and oriented at start of session with complaints of L knee pain 8/10, premedicated prior to session. Patient is [place] without hearing aides. Patient reported that she lives in one story home with husband who is available 24/7 to assist. Previously independent in ADLs/IADLs, does ambulate with RW intermittently at home (always at night), and uses quad cane for community ambulation, still drives.  The patient was able to complete therapeutic exercises with moderate verbal cues, tactile cues. SLR 2 sets of 5 without physical assist. Most difficulty with heel slides. Patient mobilized to EOB with minAx1 for LLE management. Sit <> stand from elevated bed surface with RW. First attempt patient modAx1, unable to stand upright due to significant pain, very small amount of weight on LLE. Second attempt minAx2 with RW, improved patient participation as well as posture. Patient was able to perform very small, shuffling steps to chair with minAx2 ~10ft, very antalgic and poor weight bearing noted through LLE, unable to perform TKE. Overall the patient demonstrated limitations in gait, mobility, balance, strength, endurance, ROM compared to PLOF and would benefit from further skilled PT to address these. Current recommendation is STR due to level of assistance needed for mobility at this time.     Follow Up Recommendations SNF    Equipment Recommendations  None recommended by PT;Other (comment)(Pt has RW and bedside commode.)    Recommendations for Other Services       Precautions / Restrictions Precautions Precautions: Knee Precaution Booklet Issued: Yes (comment) Restrictions Weight Bearing Restrictions: Yes LLE Weight  Bearing: Weight bearing as tolerated      Mobility  Bed Mobility Overal bed mobility: Needs Assistance Bed Mobility: Supine to Sit     Supine to sit: Min assist     General bed mobility comments: Pt needed assistance with LLE management, very little physical assist.  Transfers Overall transfer level: Needs assistance Equipment used: Rolling walker (2 wheeled) Transfers: Sit to/from Stand Sit to Stand: Min assist;+2 physical assistance;Mod assist;From elevated surface         General transfer comment: First attempt patient modAx1, unable to stand upright due to significant pain, very small amount of weight on LLE. second attempt minAx2 with RW, improved patient participation as well as posture.  Ambulation/Gait Ambulation/Gait assistance: +2 physical assistance;Min assist Gait Distance (Feet): 2 Feet Assistive device: Rolling walker (2 wheeled) Gait Pattern/deviations: Shuffle     General Gait Details: Pt able to initiate small shuffling steps on RLE, very little use of LLE during ambulation. MinAx2 for safety. Assist needed during descent to chair for LLE management.  Stairs            Wheelchair Mobility    Modified Rankin (Stroke Patients Only)       Balance Overall balance assessment: Needs assistance   Sitting balance-[name]: Poor       Standing balance-[name]: Poor                               Pertinent Vitals/Pain Pain Assessment: 0-10 Pain Score: 8  Pain Location: posterior L knee pain Pain Descriptors / Indicators: Sharp;Aching Pain Intervention(s): Limited activity within patient's tolerance;Monitored during session;Ice applied;Repositioned;Premedicated  before session    [place] expects to be discharged to:: Private residence Living Arrangements: Spouse/significant other Available Help at Discharge: Family;Available 24 hours/day Type of Home: House Home Access: Stairs to enter Entrance  Stairs-Rails: Chemical engineer of Steps: 5 Home Layout: One level Home Equipment: Environmental consultant - 2 wheels;Cane - quad;Bedside commode;Other (comment);None;Grab bars - toilet;Grab bars - tub/shower      Prior Function Level of Independence: Independent with assistive device(s)         Comments: Now has a cleaning lady. Was using RW at night for bathroom trips. States she has back pain so she will use the RW when it hurts. Uses quad cane for community ambulation. Reports she fell getting out of her high bed about 3 weeks.      Hand Dominance   Dominant Hand: Right    Extremity/Trunk Assessment   Upper Extremity Assessment Upper Extremity Assessment: Overall WFL for tasks assessed    Lower Extremity Assessment Lower Extremity Assessment: Generalized weakness    Cervical / Trunk Assessment Cervical / Trunk Assessment: Kyphotic  Communication   Communication: HOH  Cognition Arousal/Alertness: Awake/alert Behavior During Therapy: WFL for tasks assessed/performed Overall Cognitive Status: Within Functional Limits for tasks assessed                                        General Comments      Exercises Total Joint Exercises Ankle Circles/Pumps: AROM;Both;20 reps Quad Sets: AROM;Both;15 reps Heel Slides: AAROM;Left;10 reps Hip ABduction/ADduction: AROM;Left;15 reps Straight Leg Raises: AROM;Left;10 reps   Assessment/Plan    PT Assessment Patient needs continued PT services  PT Problem List Decreased strength;Decreased range of motion;Decreased knowledge of use of DME;Decreased activity tolerance;Decreased balance;Decreased knowledge of precautions;Pain;Decreased mobility       PT Treatment Interventions DME instruction;Balance training;Gait training;Stair training;Neuromuscular re-education;Functional mobility training;Therapeutic activities;Patient/family education;Therapeutic exercise    PT Goals (Current goals can be found in the Care  Plan section)  Acute Rehab PT Goals Patient Stated Goal: Patient would like to go home. PT Goal Formulation: With patient Time For Goal Achievement: 03/13/18 Potential to Achieve Goals: Good    Frequency BID   Barriers to discharge        Co-evaluation               AM-PAC PT "6 Clicks" Daily Activity  Outcome Measure Difficulty turning over in bed (including adjusting bedclothes, sheets and blankets)?: A Little Difficulty moving from lying on back to sitting on the side of the bed? : Unable Difficulty sitting down on and standing up from a chair with arms (e.g., wheelchair, bedside commode, etc,.)?: Unable Help needed moving to and from a bed to chair (including a wheelchair)?: A Lot Help needed walking in hospital room?: A Lot Help needed climbing 3-5 steps with a railing? : Total 6 Click Score: 10    End of Session Equipment Utilized During Treatment: Gait belt Activity Tolerance: Patient limited by pain;Patient limited by fatigue Patient left: in chair;with chair alarm set;Other (comment);with call bell/phone within reach;with SCD's reapplied(heels elevated, polar care in place) Nurse Communication: Mobility status PT Visit Diagnosis: Unsteadiness on feet (R26.81);Difficulty in walking, not elsewhere classified (R26.2);Muscle weakness (generalized) (M62.81);Pain Pain - Right/Left: Left Pain - part of body: Knee    Time: 8250-5397 PT Time Calculation (min) (ACUTE ONLY): 41 min   Charges:   PT Evaluation $PT Eval Low Complexity:  1 Low PT Treatments $Therapeutic Exercise: 8-22 mins $Therapeutic Activity: 8-22 mins      Lieutenant Diego PT, DPT 10:18 AM,02/27/18 [phone]

## 2018-02-27 NOTE — Discharge Instructions (Signed)

## 2018-02-27 NOTE — Anesthesia Postprocedure Evaluation (Signed)
Anesthesia Post Note  Patient: [name]  Procedure(s) Performed: TOTAL KNEE ARTHROPLASTY (Left Knee)  Patient location during evaluation: Nursing Unit Anesthesia Type: Spinal Level of consciousness: awake, awake and alert and oriented Pain management: pain level controlled Vital Signs Assessment: post-procedure vital signs reviewed and stable Respiratory status: spontaneous breathing and nonlabored ventilation Cardiovascular status: stable Postop Assessment: no headache Anesthetic complications: no     Last Vitals:  Vitals:   02/27/18 0031 02/27/18 0408  BP: (!) 126/51 (!) 116/48  Pulse: 73 72  Resp: 18 16  Temp: 36.8 C 36.8 C  SpO2: 97% 97%    Last Pain:  Vitals:   02/27/18 0408  TempSrc: Oral  PainSc:                  Buckner [place]

## 2018-02-27 NOTE — Progress Notes (Signed)
Subjective: 1 Day Post-Op Procedure(s) (LRB): TOTAL KNEE ARTHROPLASTY (Left) Patient reports pain as 8 on 0-10 scale.   Patient is well, and has had no acute complaints or problems  PT and care management to assist with discharge planning. Negative for chest pain and shortness of breath Fever: no Gastrointestinal:Negative for nausea and vomiting  Objective: Vital signs in last 24 hours: Temp:  [97.2 F (36.2 C)-98.4 F (36.9 C)] 98.2 F (36.8 C) (11/13 0408) Pulse Rate:  [53-81] 72 (11/13 0408) Resp:  [14-18] 16 (11/13 0408) BP: (108-155)/(44-75) 116/48 (11/13 0408) SpO2:  [97 %-100 %] 97 % (11/13 0408)  Intake/Output from previous day:  Intake/Output Summary (Last 24 hours) at 02/27/2018 0756 Last data filed at 02/27/2018 6415 Gross per 24 hour  Intake 1513.65 ml  Output 2805 ml  Net -1291.35 ml    Intake/Output this shift: No intake/output data recorded.  Labs: Recent Labs    02/27/18 0334  HGB 9.7*   Recent Labs    02/27/18 0334  WBC 7.6  RBC 2.99*  HCT 30.3*  PLT 184   Recent Labs    02/27/18 0334  NA 140  K 3.9  CL 106  CO2 25  BUN 31*  CREATININE 2.07*  GLUCOSE 126*  CALCIUM 8.7*   Recent Labs    02/26/18 0643  INR 1.02     EXAM General - Patient is Alert, Appropriate and Oriented Extremity - Sensation intact distally Intact pulses distally Dorsiflexion/Plantar flexion intact Incision: dressing C/D/I Compartment soft Dressing/Incision - clean, dry, no drainage Motor Function - intact, moving foot and toes well on exam.  Abdomen soft with normal bowel sounds. 2+ pitting edema to the left leg, negative homans.  Past Medical History:  Diagnosis Date  . Anemia   . Arthritis    "back, hands" (09/08/2015)  . Atrial fibrillation ([place])    Echocardiogram 5/12: normal LV function, moderate LAE, mild RAE, mild AI and MR and mild-to-moderate TR.  Myoview 5/ 12 showed an ejection fraction of 73% and normal perfusion.   Patient underwent  elective DCCV on 08/26/10  . Breast cancer ([place]) 2007, 2013   right breast- 2007 radiation-mastectomy  . Cancer of right breast ([place])   . Chronic kidney disease   . Chronic lower back pain   . CKD (chronic kidney disease), stage IV ([place])   . Complication of anesthesia    "took me about 1 week to know what was going on after one of my knee ORs"  . COPD (chronic obstructive pulmonary disease) ([place])   . DDD (degenerative disc disease), lumbar   . Depression   . 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 Dyspnea    DOE  . Dysrhythmia    AFIB  . Edema    FEET/ANKLES  . GERD (gastroesophageal reflux disease)   . Hyperlipidemia   . Hypertension   . Hypothyroid   . PAD (peripheral artery disease) ([place])   . PVD (peripheral vascular disease) ([place])   . Sepsis ([place])    UTI/ECOLI I WEEK AGO   Assessment/Plan: 1 Day Post-Op Procedure(s) (LRB): TOTAL KNEE ARTHROPLASTY (Left) Active Problems:   Status post total knee replacement using cement, left  Estimated body mass index is 19.01 kg/m as calculated from the following:   Height as of this encounter: 5\' 8"  (1.727 m).   Weight as of this encounter: 56.7 kg. Advance diet Up with therapy D/C IV fluids when tolerating po intake.  Labs reviewed this AM.  Hg 9.7 this AM. Up with therapy today. Begin working on BM.  DVT Prophylaxis - Lovenox, Foot Pumps and TED hose Weight-Bearing as tolerated to left leg  J. Cameron Proud, PA-C [facility] Orthopaedic Surgery 02/27/2018, 7:56 AM

## 2018-02-27 NOTE — Clinical Social Work Note (Signed)
Clinical Social Work Assessment  Patient Details  Name: [name] MRN: [id number] Date of Birth: Nov 17, 1932  Date of referral:  02/27/18               Reason for consult:  Facility Placement                Permission sought to share information with:  Chartered certified accountant granted to share information::  Yes, Verbal Permission Granted  Name::      [name]::   [name]   Relationship::     Contact Information:     Housing/Transportation Living arrangements for the past 2 months:  [place] of Information:  Patient Patient Interpreter Needed:  None Criminal Activity/Legal Involvement Pertinent to Current Situation/Hospitalization:  No - Comment as needed Significant Relationships:  Spouse Lives with:  Spouse Do you feel safe going back to the place where you live?  Yes Need for family participation in patient care:  Yes (Comment)  Care giving concerns:  Patient lives in [place] with her husband of 40 years [name].    Social Worker assessment / plan:  Holiday representative (CSW) received SNF consult. PT is recommending SNF. CSW met with patient alone at bedside to discuss D/C plan. Patient was alert and oriented X4 and was sitting up in the chair at bedside. CSW introduced self and explained role of CSW department. Per patient she lives with her husband [name] in [place]. CSW explained SNF process and that [facility] will have to approve it. Patient is agreeable to SNF search in [facility]. FL2 complete and faxed out. CSW presented bed offers to patient and she chose [facility] Per [facility] admissions coordinator at [facility] she will start [facility] authorization today. CSW left patient's husband [name] a Advertising account executive. CSW will continue to follow and assist as needed.   Employment status:  Retired Nurse, adult PT Recommendations:  [place] / Referral to  community resources:  [place]  Patient/Family's Response to care:  Patient is agreeable to D/C to [facility].   Patient/Family's Understanding of and Emotional Response to Diagnosis, Current Treatment, and Prognosis:  Patient was very pleasant and thanked CSW for assistance.   Emotional Assessment Appearance:  Appears stated age Attitude/Demeanor/Rapport:    Affect (typically observed):  Accepting, Adaptable, Pleasant Orientation:  Oriented to Self, Oriented to Place, Oriented to  Time, Oriented to Situation Alcohol / Substance use:  Not Applicable Psych involvement (Current and /or in the community):  No (Comment)  Discharge Needs  Concerns to be addressed:  Discharge Planning Concerns Readmission within the last 30 days:  No Current discharge risk:  Dependent with Mobility Barriers to Discharge:  Continued Medical Work up   [employer], Latoya Beets, LCSW 02/27/2018, 2:30 PM

## 2018-02-27 NOTE — Progress Notes (Signed)
Physical Therapy Treatment Patient Details Name: [name] MRN: [id number] DOB: March 20, 1933 Today's Date: 02/27/2018    History of Present Illness Pt is 84 yo female s/p L TKA, WBAT. PMH of COPD, SOB, HTN, CHF, CKD, hypothyroidism, afib, breast cx    PT Comments    Patient up in chair at start of session, agreeable to PT, pain in L knee 7/10, nursing aware and premedicated prior. Patient able to perform therapeutic exercises with moderate verbal cues for technique/form. ROM -7-58deg of LLE. Pt educated on importance of maintaining knee extension while at rest. Patient performed sit to stand with minAx1 and heavy use of chair arms, difficulty with moving hands from chair to RW. Able to take shuffling, antalgic steps towards bed with RW and minAx1. Decreased weight bearing on LLE, cues for posture, AD management, and sequencing of movements still needed. ModAx1 for stand to sit transfer due to poor eccentric control, sit to supine minAx1 for LE management. Patient in bed with all needs in reach at end of session. The patient would benefit from further skilled PT to continue to progress towards goals.     Follow Up Recommendations  SNF     Equipment Recommendations  None recommended by PT;Other (comment)(Pt has RW and bedside commode)    Recommendations for Other Services       Precautions / Restrictions Precautions Precautions: Knee Restrictions Weight Bearing Restrictions: Yes LLE Weight Bearing: Weight bearing as tolerated    Mobility  Bed Mobility Overal bed mobility: Needs Assistance Bed Mobility: Sit to Supine     Supine to sit: Min assist     General bed mobility comments: Pt needed assistance with LE management  Transfers Overall transfer level: Needs assistance Equipment used: Rolling walker (2 wheeled) Transfers: Sit to/from Stand Sit to Stand: Min assist;Mod assist         General transfer comment: Heavy use of chair arms, Pt with very little weight  bearing on LLE. difficulty transferring hands from chair to RW, cues for posture needed. stand to sit modAx1  Ambulation/Gait Ambulation/Gait assistance: Min assist Gait Distance (Feet): 2 Feet Assistive device: Rolling walker (2 wheeled) Gait Pattern/deviations: Shuffle     General Gait Details: Pt able to initiate small shuffling steps on RLE, very little use of LLE during ambulation. MinAx1 for safety. modAx1 needed during descent to bed due to poor eccentric control   Stairs             Wheelchair Mobility    Modified Rankin (Stroke Patients Only)       Balance Overall balance assessment: Needs assistance   Sitting balance-[name]: Poor       Standing balance-[name]: Poor                              Cognition Arousal/Alertness: Awake/alert Behavior During Therapy: WFL for tasks assessed/performed Overall Cognitive Status: Within Functional Limits for tasks assessed                                        Exercises Total Joint Exercises Ankle Circles/Pumps: AROM;Both;20 reps Quad Sets: AROM;Both;20 reps Hip ABduction/ADduction: AROM;Left;15 reps Knee Flexion: AROM;Left;5 reps Goniometric ROM: L knee extension: -7deg L knee flexion: 58deg    General Comments        Pertinent Vitals/Pain Pain Assessment: 0-10 Pain Score: 7  Pain Location: posterior L knee pain Pain Descriptors / Indicators: Sharp;Aching Pain Intervention(s): Limited activity within patient's tolerance;Patient requesting pain meds-RN notified;Monitored during session;Ice applied;Premedicated before session;Repositioned    Home Living                      Prior Function            PT Goals (current goals can now be found in the care plan section) Progress towards PT goals: Progressing toward goals(progressing towards goals slowly)    Frequency    BID      PT Plan Current plan remains appropriate    Co-evaluation               AM-PAC PT "6 Clicks" Daily Activity  Outcome Measure  Difficulty turning over in bed (including adjusting bedclothes, sheets and blankets)?: A Little Difficulty moving from lying on back to sitting on the side of the bed? : Unable Difficulty sitting down on and standing up from a chair with arms (e.g., wheelchair, bedside commode, etc,.)?: Unable Help needed moving to and from a bed to chair (including a wheelchair)?: A Lot Help needed walking in hospital room?: A Lot Help needed climbing 3-5 steps with a railing? : Total 6 Click Score: 10    End of Session Equipment Utilized During Treatment: Gait belt Activity Tolerance: Patient limited by pain;Patient limited by fatigue Patient left: in bed;with call bell/phone within reach;with SCD's reapplied;with bed alarm set;Other (comment)(heels elevated, polar care in place) Nurse Communication: Mobility status PT Visit Diagnosis: Unsteadiness on feet (R26.81);Difficulty in walking, not elsewhere classified (R26.2);Muscle weakness (generalized) (M62.81);Pain Pain - Right/Left: Left Pain - part of body: Knee     Time: 0383-3383 PT Time Calculation (min) (ACUTE ONLY): 24 min  Charges:  $Therapeutic Exercise: 8-22 mins $Therapeutic Activity: 8-22 mins                     Lieutenant Diego PT, DPT 5:11 PM,02/27/18 [phone]

## 2018-02-27 NOTE — Clinical Social Work Placement (Signed)
   CLINICAL SOCIAL WORK PLACEMENT  NOTE  Date:  02/27/2018  Patient Details  Name: [name] MRN: [id number] Date of Birth: 1932/07/15  Clinical Social Work is seeking post-discharge placement for this patient at the [place] level of care (*CSW will initial, date and re-position this form in  chart as items are completed):  Yes   Patient/family provided with [facility] Work Department's list of facilities offering this level of care within the geographic area requested by the patient (or if unable, by the patient's family).  Yes   Patient/family informed of their freedom to choose among providers that offer the needed level of care, that participate in Medicare, Medicaid or managed care program needed by the patient, have an available bed and are willing to accept the patient.  Yes   Patient/family informed of [facility]'s ownership interest in [facility] and [facility], as well as of the fact that they are under no obligation to receive care at these facilities.  PASRR submitted to EDS on       PASRR number received on       Existing PASRR number confirmed on 02/26/18     FL2 transmitted to all facilities in geographic area requested by pt/family on 02/27/18     FL2 transmitted to all facilities within larger geographic area on       Patient informed that his/her managed care company has contracts with or will negotiate with certain facilities, including the following:        Yes   Patient/family informed of bed offers received.  [facility]     Physician recommends and patient chooses bed at      Patient to be transferred to   on  .  Patient to be transferred to facility by       Patient family notified on   of transfer.  Name of family member notified:        PHYSICIAN       Additional Comment:    _______________________________________________ Calvin Chura, Veronia Beets, LCSW 02/27/2018, 2:29  PM

## 2018-02-27 NOTE — Progress Notes (Signed)
PT Cancellation Note  Patient Details Name: [name] MRN: [id number] DOB: 1932-06-27   Cancelled Treatment:      PT in room to assess sensation and pain, patient reporting decreased sensation to LLE. PT will follow up as able when patient is appropriate.  Lieutenant Diego PT, DPT 3:31 PM,02/26/18 [phone]

## 2018-02-28 ENCOUNTER — Inpatient Hospital Stay: Payer: Medicare HMO

## 2018-02-28 LAB — BASIC METABOLIC PANEL
Anion gap: 8 (ref 5–15)
BUN: 23 mg/dL (ref 8–23)
CHLORIDE: 107 mmol/L (ref 98–111)
CO2: 27 mmol/L (ref 22–32)
Calcium: 9 mg/dL (ref 8.9–10.3)
Creatinine, Ser: 1.5 mg/dL — ABNORMAL HIGH (ref 0.44–1.00)
GFR calc Af Amer: 36 mL/min — ABNORMAL LOW (ref >60)
GFR calc non Af Amer: 31 mL/min — ABNORMAL LOW (ref >60)
GLUCOSE: 109 mg/dL — AB (ref 70–99)
Potassium: 3.7 mmol/L (ref 3.5–5.1)
Sodium: 142 mmol/L (ref 135–145)

## 2018-02-28 MED ORDER — FAMOTIDINE 20 MG PO TABS
10.0000 mg | ORAL_TABLET | Freq: Every day | ORAL | Status: DC
  Administered 2018-02-28 – 2018-03-02 (×3): 10 mg via ORAL
  Filled 2018-02-28 (×3): qty 1

## 2018-02-28 MED ORDER — APIXABAN 5 MG PO TABS: 5.0000 mg | ORAL_TABLET | Freq: Every day | ORAL | Status: DC

## 2018-02-28 MED ORDER — APIXABAN 5 MG PO TABS
10.0000 mg | ORAL_TABLET | Freq: Two times a day (BID) | ORAL | Status: DC
  Administered 2018-02-28 – 2018-03-02 (×4): 10 mg via ORAL
  Filled 2018-02-28 (×4): qty 2

## 2018-02-28 NOTE — Discharge Summary (Addendum)
Physician Discharge Summary  Subjective: 4 Days Post-Op Procedure(s) (LRB): TOTAL KNEE ARTHROPLASTY (Left) Patient reports pain as moderate.   Patient seen in rounds with Dr. Roland Rack. Patient is well, and has had no acute complaints or problems Patient is ready to go to rehab for physical therapy.  Physician Discharge Summary  Patient ID: [name] MRN: [id number] DOB/AGE: Apr 19, 1932 85 y.o.  Admit date: 02/26/2018 Discharge date: March 02, 2018  Admission Diagnoses:  Discharge Diagnoses:  Active Problems:   Status post total knee replacement using cement, left   Discharged Condition: Stable  Hospital Course: The patient is postop day 3 from a left total knee replacement.  The patient has ambulated several feet with physical therapy.  The patient has been evaluated by cardiology for paroxysmal atrial fibrillation with elevated heart rate.  They have added amiodarone and a beta-blocker.  She is being evaluated for persistent atrial fibrillation and possible cardioversion on Monday, if persistent.  The patient is ready to go to rehab.  Treatments: surgery:  Left TKA using all-cemented [employer] Vanguard system with a 67.5 mm mm PCR femur and a 71 mm tibial tray with a 10 mm AS E-poly insert.  Surgeon:   Pascal Lux, MD  Assistant:   Cameron Proud, PA-C; Phoebe Sharps, PA-S  Anesthesia:   Spinal  Findings:   As above  Complications:   None  EBL:   5 cc  Fluids:   1100 cc crystalloid  UOP:   300 cc  TT:   83 minutes at 300 mmHg  Drains:   None  Closure:   Staples  Implants:   As above  Discharge Exam: Blood pressure 108/69, pulse (!) 104, temperature 98.5 F (36.9 C), temperature source Oral, resp. rate 16, height 5\' 8"  (1.727 m), weight 56.7 kg, SpO2 97 %.   Disposition: Discharge disposition: 01-Home or Self Care        Allergies as of 03/02/2018      Reactions   Other Other (See Comments)   NO BP, VENIPUNCTURE, OR ACCESS IN RIGHT  ARM - S/P MASTECTOMY ON RIGHT   Amlodipine Nausea And Vomiting   Ciprofloxacin Nausea Only   Doxycycline Itching, Other (See Comments)   shaky   Lipitor [atorvastatin] Other (See Comments)   Joint pain   Morphine And Related Nausea And Vomiting   Macrobid [nitrofurantoin Macrocrystal] Rash   Penicillins Rash, Other (See Comments)   Has patient had a PCN reaction causing immediate rash, facial/tongue/throat swelling, SOB or lightheadedness with hypotension: yes Has patient had a PCN reaction causing severe rash involving mucus membranes or skin necrosis: no Has patient had a PCN reaction that required hospitalization no Has patient had a PCN reaction occurring within the last 10 years: no If all of the above answers are "NO", then may proceed with Cephalosporin use.   Sulfa Antibiotics Rash      Medication List    STOP taking these medications   aspirin EC 81 MG tablet     TAKE these medications   acetaminophen 500 MG tablet Commonly known as:  TYLENOL Take 1,000 mg by mouth at bedtime as needed for moderate pain or headache.   amiodarone 200 MG tablet Commonly known as:  PACERONE Take 1 tablet (200 mg total) by mouth daily. What changed:  Another medication with the same name was added. Make sure you understand how and when to take each.   amiodarone 200 MG tablet Commonly known as:  PACERONE Take 1 tablet (200 mg  total) by mouth 2 (two) times daily. What changed:  You were already taking a medication with the same name, and this prescription was added. Make sure you understand how and when to take each.   apixaban 5 MG Tabs tablet Commonly known as:  ELIQUIS Take 2 tablets (10 mg total) by mouth 2 (two) times daily for 7 days.   apixaban 5 MG Tabs tablet Commonly known as:  ELIQUIS Take 1 tablet (5 mg total) by mouth 2 (two) times daily. Start taking on:  03/08/2018   calcium-vitamin D 500-200 MG-UNIT tablet Commonly known as:  OSCAL WITH D Take 1 tablet by mouth  daily with breakfast.   docusate sodium 100 MG capsule Commonly known as:  COLACE Take 100 mg by mouth daily as needed for mild constipation.   Iron-Vitamin C 65-125 MG Tabs Take 1 tablet by mouth daily.   lactose free nutrition Liqd Take 237 mLs by mouth daily.   levothyroxine 150 MCG tablet Commonly known as:  SYNTHROID, LEVOTHROID Take 150 mcg by mouth daily before breakfast.   lisinopril 10 MG tablet Commonly known as:  PRINIVIL,ZESTRIL Take 10 mg by mouth daily.   metoprolol tartrate 50 MG tablet Commonly known as:  LOPRESSOR Take 1 tablet (50 mg total) by mouth 2 (two) times daily.   multivitamin tablet Take 1 tablet by mouth daily.   ondansetron 4 MG tablet Commonly known as:  ZOFRAN Take 1 tablet (4 mg total) by mouth every 6 (six) hours as needed for nausea.   oxyCODONE 5 MG immediate release tablet Commonly known as:  Oxy IR/ROXICODONE Take 1-2 tablets (5-10 mg total) by mouth every 4 (four) hours as needed for moderate pain.   pantoprazole 40 MG tablet Commonly known as:  PROTONIX Take 1 tablet (40 mg total) by mouth daily.   potassium chloride 10 MEQ tablet Commonly known as:  K-DUR Take 10 mEq by mouth 2 (two) times daily.   ranitidine 300 MG tablet Commonly known as:  ZANTAC Take 300 mg by mouth at bedtime.   rosuvastatin 10 MG tablet Commonly known as:  CRESTOR Take 1 tablet (10 mg total) by mouth daily.   spironolactone 25 MG tablet Commonly known as:  ALDACTONE Take 25 mg by mouth daily.   torsemide 20 MG tablet Commonly known as:  DEMADEX Take 20 mg by mouth daily.   traMADol 50 MG tablet Commonly known as:  ULTRAM Take 1 tablet (50 mg total) by mouth every 6 (six) hours as needed for moderate pain.   Vitamin D3 125 MCG (5000 UT) Caps Take 5,000 Units by mouth daily.            Durable Medical Equipment  (From admission, onward)         Start     Ordered   02/26/18 1143  DME Walker rolling  Once    Question:  Patient  needs a walker to treat with the following condition  Answer:  Status post total knee replacement using cement, left   02/26/18 1142   02/26/18 1143  DME Bedside commode  Once    Question:  Patient needs a bedside commode to treat with the following condition  Answer:  Status post total knee replacement using cement, left   02/26/18 1142   02/26/18 1143  DME 3 n 1  Once     02/26/18 1142          Contact information for follow-up providers    Poggi, Marshall Cork, MD. Daphane Shepherd  on 03/13/2018.   Specialty:  Surgery Why:  Staple Removal. @ 2:00 pm Contact information: [place] [address] [phone]            Contact information for after-discharge care    Destination    [facility] Preferred SNF.   Service:  Skilled Nursing Contact information: [address] [place] [phone]                  Signed: Prescott Parma, [place] 03/02/2018, 7:01 AM   Objective: Vital signs in last 24 hours: Temp:  [97.9 F (36.6 C)-98.5 F (36.9 C)] 98.5 F (36.9 C) (11/16 0033) Pulse Rate:  [104-129] 104 (11/16 0033) Resp:  [16] 16 (11/16 0033) BP: (108-119)/(66-69) 108/69 (11/16 0033) SpO2:  [95 %-99 %] 97 % (11/16 0033)  Intake/Output from previous day: No intake or output data in the 24 hours ending 03/02/18 0701  Intake/Output this shift: No intake/output data recorded.  Labs: Recent Labs    03/01/18 0406  HGB 10.0*   Recent Labs    03/01/18 0406  WBC 11.0*  RBC 3.09*  HCT 31.8*  PLT 218   Recent Labs    02/28/18 0434 03/01/18 0406  NA 142 138  K 3.7 3.7  CL 107 104  CO2 27 27  BUN 23 25*  CREATININE 1.50* 1.53*  GLUCOSE 109* 102*  CALCIUM 9.0 8.9   No results for input(s): LABPT, INR in the last 72 hours.  EXAM: General - Patient is Alert and Oriented Extremity - Intact pulses distally Dorsiflexion/Plantar flexion intact Compartment soft Incision - clean, dry, no  drainage Motor Function -plantarflexion and dorsiflexion are intact  Assessment/Plan: 4 Days Post-Op Procedure(s) (LRB): TOTAL KNEE ARTHROPLASTY (Left) Procedure(s) (LRB): TOTAL KNEE ARTHROPLASTY (Left) Past Medical History:  Diagnosis Date  . (HFpEF) heart failure with preserved ejection fraction ([place])    a. 12/2015 Echo: EF 60-65%. no rwma, Gr2 DD, triv AI, mild MR, mildly dil LA. Mild-mod TR. PASP 5mmHg.  Marland Kitchen Anemia   . Arthritis    "back, hands" (09/08/2015)  . Cancer of right breast [facility]) 2007, 2013   right breast- 2007 radiation-mastectomy  . Chronic lower back pain   . CKD (chronic kidney disease), stage IV ([place])   . Complication of anesthesia    "took me about 1 week to know what was going on after one of my knee ORs"  . COPD (chronic obstructive pulmonary disease) ([place])   . DDD (degenerative disc disease), lumbar   . Depression   . 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 Edema    FEET/ANKLES  . GERD (gastroesophageal reflux disease)   . History of sepsis   . History of stress test    a. 08/2010: EF 73%, no ischemia/infarct.  . Hyperlipidemia   . Hypertension   . Hypothyroid   . PAD (peripheral artery disease) ([place])    a. 08/2015 s/p PTA of R AT w/ drug-coated balloon angioplasty to R Popliteal; b. 05/2017 ABI: R 1.16, L 0.88-->stable.  Marland Kitchen PAF (paroxysmal atrial fibrillation) ([place])    a. 08/2010 s/p DCCV;  b. CHA2DS2VASc = 6-->No OAC 2/2 h/o falls.   Active Problems:   Status post total knee replacement using cement, left  Estimated body mass index is 19.01 kg/m as calculated from the following:   Height as of this encounter: 5\' 8"  (1.727 m).   Weight as of this encounter: 56.7 kg. Advance diet Up  with therapy D/C IV fluids Discharge to SNF Diet - Regular diet Follow up - in 2 weeks Activity - WBAT Disposition - Rehab Condition Upon Discharge - Stable DVT Prophylaxis -Eliquis and TED stockings  Reche Dixon, PA-C Orthopaedic  Surgery 03/02/2018, 7:01 AM

## 2018-02-28 NOTE — Progress Notes (Addendum)
Physical Therapy Treatment Patient Details Name: [name] MRN: [id number] DOB: [birth year] Today's Date: 02/28/2018    History of Present Illness Pt is 84 yo female s/p L TKA, WBAT. PMH of COPD, SOB, HTN, CHF, CKD, hypothyroidism, afib, breast cx    PT Comments    Patient was able to demonstrate progress with ambulation using RW and participation with there ex this afternoon.  She appeared to be somewhat lethargic at beginning and end of session and reported that she thinks it is related to pain meds.  Pt also started on Eliquis for small DVT today. Per RN, pt does not have any mobility restrictions.  PT provided training related to use of RW to manage L LE pain as well as UE exercises to assist in shoulder depression and pt was receptive.  She ambulated 8 ft with use of RW and +2 assist.  Pt required min A for some there ex and reported slight pain increase.  Pt able to perform bed mobility with min A, including performing unilateral bridge to scoot up in bed.  Pt receptive to education regarding frequent participation in HEP.  She was able to achieve a significant increase in L knee flexion following repeated knee flexion in sitting (87 degrees).  Pt will continue to benefit from skilled PT with focus on strength, safe functional mobility, knee ROM and pain management.  SNF setting is still appropriate for discharge.  Follow Up Recommendations  SNF     Equipment Recommendations  None recommended by PT;Other (comment)    Recommendations for Other Services       Precautions / Restrictions Precautions Precautions: Knee Restrictions Weight Bearing Restrictions: Yes LLE Weight Bearing: Weight bearing as tolerated    Mobility  Bed Mobility Overal bed mobility: Needs Assistance Bed Mobility: Supine to Sit     Supine to sit: Min assist     General bed mobility comments: Patient needed minAx1 for LE management; pt can scoot herself up in bed with use of UE's and unilateral bridging  using R LE.  Transfers Overall transfer level: Needs assistance Equipment used: Rolling walker (2 wheeled) Transfers: Sit to/from Stand Sit to Stand: Mod assist;+2 safety/equipment         General transfer comment: VC's for anterior wt shift to stand, increased time needed to bring hands to RW,  VC's for upright posture.  Ambulation/Gait Ambulation/Gait assistance: Mod assist Gait Distance (Feet): 8 Feet Assistive device: Rolling walker (2 wheeled) Gait Pattern/deviations: Shuffle   Gait velocity interpretation: <1.31 ft/sec, indicative of household ambulator General Gait Details: Educated pt concerning use of UE's to offload L LE and advance R LE, lowered RW 2 notches and demonstrated WB with UE to offload L LE.  Several VC's for upright posture.  Pt seemed receptive to education and was able to demonstrate advancement of R LE 2-3 times.  Pt's husband provided assistance for safety.   Stairs             Wheelchair Mobility    Modified Rankin (Stroke Patients Only)       Balance Overall balance assessment: Needs assistance Sitting-balance support: Feet supported;Bilateral upper extremity supported Sitting balance-[name]: Fair       Standing balance-[name]: Poor                              Cognition Arousal/Alertness: Awake/alert Behavior During Therapy: WFL for tasks assessed/performed Overall Cognitive Status: Within Functional Limits for  tasks assessed                                 General Comments: Some mild confusion and lethargy which pt reported happens following administration of pain meds.      Exercises Total Joint Exercises Quad Sets: 10 reps;Strengthening;AROM;Left Heel Slides: AAROM;Left;10 reps Hip ABduction/ADduction: Left;10 reps;AAROM Knee Flexion: AROM;5 reps;Seated Goniometric ROM: L knee ext/flex: 2-87 degrees. Other Exercises Other Exercises: Reviewed HEP and discussed importance of frequent  exercise x5 min Other Exercises: Demonstrated seated shoulder depression exercises and educated as related to use of RW x3 min    General Comments        Pertinent Vitals/Pain Pain Score: 7  Pain Intervention(s): Monitored during session;Premedicated before session    Home Living                      Prior Function            PT Goals (current goals can now be found in the care plan section) Progress towards PT goals: Progressing toward goals    Frequency    BID      PT Plan Current plan remains appropriate    Co-evaluation              AM-PAC PT "6 Clicks" Daily Activity  Outcome Measure  Difficulty turning over in bed (including adjusting bedclothes, sheets and blankets)?: A Little Difficulty moving from lying on back to sitting on the side of the bed? : A Lot Difficulty sitting down on and standing up from a chair with arms (e.g., wheelchair, bedside commode, etc,.)?: Unable Help needed moving to and from a bed to chair (including a wheelchair)?: A Lot Help needed walking in hospital room?: A Lot Help needed climbing 3-5 steps with a railing? : Total 6 Click Score: 11    End of Session Equipment Utilized During Treatment: Gait belt Activity Tolerance: Patient limited by pain;Patient limited by fatigue Patient left: Other (comment) Nurse Communication: Mobility status PT Visit Diagnosis: Unsteadiness on feet (R26.81);Difficulty in walking, not elsewhere classified (R26.2);Muscle weakness (generalized) (M62.81);Pain Pain - Right/Left: Left Pain - part of body: Knee     Time: 0076-2263 PT Time Calculation (min) (ACUTE ONLY): 31 min  Charges:  $Therapeutic Exercise: 8-22 mins $Therapeutic Activity: 8-22 mins                     Roxanne Gates, PT, DPT    Roxanne Gates 02/28/2018, 3:56 PM, Addendum [id number], 02/28/2018

## 2018-02-28 NOTE — Progress Notes (Signed)
Per [facility] admissions coordinator at [facility] requested post op day 2 notes and they were sent. [facility] SNF authorization is still pending.   [employer], LCSW [phone]

## 2018-02-28 NOTE — Progress Notes (Addendum)
Subjective: 2 Days Post-Op Procedure(s) (LRB): TOTAL KNEE ARTHROPLASTY (Left) Patient reports pain as moderate.   Patient is well, and has had no acute complaints or problems  PT and care management to assist with discharge planning. Negative for chest pain and shortness of breath Fever: no Gastrointestinal:Negative for nausea and vomiting  Objective: Vital signs in last 24 hours: Temp:  [98.1 F (36.7 C)-98.7 F (37.1 C)] 98.7 F (37.1 C) (11/13 2353) Pulse Rate:  [68-82] 82 (11/13 2353) Resp:  [19] 19 (11/13 2353) BP: (123-149)/(39-62) 142/62 (11/13 2353) SpO2:  [96 %-100 %] 96 % (11/13 2353)  Intake/Output from previous day:  Intake/Output Summary (Last 24 hours) at 02/28/2018 0602 Last data filed at 02/27/2018 1411 Gross per 24 hour  Intake 240 ml  Output 450 ml  Net -210 ml    Intake/Output this shift: No intake/output data recorded.  Labs: Recent Labs    02/27/18 0334  HGB 9.7*   Recent Labs    02/27/18 0334  WBC 7.6  RBC 2.99*  HCT 30.3*  PLT 184   Recent Labs    02/27/18 0334  NA 140  K 3.9  CL 106  CO2 25  BUN 31*  CREATININE 2.07*  GLUCOSE 126*  CALCIUM 8.7*   Recent Labs    02/26/18 0643  INR 1.02     EXAM General - Patient is Alert, Appropriate and Oriented Extremity - Sensation intact distally Intact pulses distally Dorsiflexion/Plantar flexion intact Incision: dressing C/D/I Compartment soft Dressing/Incision - clean, dry, no drainage with surgical bandage removed. Motor Function - intact, moving foot and toes well on exam.   Ambulated 2 feet. 2+ pitting edema to the left leg, negative homans.  Past Medical History:  Diagnosis Date  . Anemia   . Arthritis    "back, hands" (09/08/2015)  . Atrial fibrillation ([place])    Echocardiogram 5/12: normal LV function, moderate LAE, mild RAE, mild AI and MR and mild-to-moderate TR.  Myoview 5/ 12 showed an ejection fraction of 73% and normal perfusion.   Patient underwent elective  DCCV on 08/26/10  . Breast cancer ([place]) 2007, 2013   right breast- 2007 radiation-mastectomy  . Cancer of right breast ([place])   . Chronic kidney disease   . Chronic lower back pain   . CKD (chronic kidney disease), stage IV ([place])   . Complication of anesthesia    "took me about 1 week to know what was going on after one of my knee ORs"  . COPD (chronic obstructive pulmonary disease) ([place])   . DDD (degenerative disc disease), lumbar   . Depression   . 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 Dyspnea    DOE  . Dysrhythmia    AFIB  . Edema    FEET/ANKLES  . GERD (gastroesophageal reflux disease)   . Hyperlipidemia   . Hypertension   . Hypothyroid   . PAD (peripheral artery disease) ([place])   . PVD (peripheral vascular disease) ([place])   . Sepsis ([place])    UTI/ECOLI I WEEK AGO   Assessment/Plan: 2 Days Post-Op Procedure(s) (LRB): TOTAL KNEE ARTHROPLASTY (Left) Active Problems:   Status post total knee replacement using cement, left  Estimated body mass index is 19.01 kg/m as calculated from the following:   Height as of this encounter: 5\' 8"  (1.727 m).   Weight as of this encounter: 56.7 kg. Advance diet Up with therapy D/C IV fluids when tolerating po intake.  Up with therapy today. Continue  working on BM. Plan to discharge to rehab tomorrow.  DVT Prophylaxis - Lovenox, Foot Pumps and TED hose Weight-Bearing as tolerated to left leg  Reche Dixon, PA-C [facility] Orthopaedic Surgery 02/28/2018, 6:02 AM    Addendum: On examination, the patient's left lower extremity was noticeably more swollen than the right.  She also had tenderness to palpation in the posterior calf region, although she did not have a positive Homans sign.  Regardless, a Doppler ultrasound was ordered.  The results of this have shown a DVT involving the peroneal vein, but not involving the popliteal or more proximal veins.  The patient will be started on Eliquis as treatment for  this DVT.  Pascal Lux, MD [facility] Orthopaedic Surgery 02/28/2018, 1:30 PM

## 2018-02-28 NOTE — Progress Notes (Signed)
Physical Therapy Treatment Patient Details Name: [name] MRN: [id number] DOB: 07-May-1932 Today's Date: 02/28/2018    History of Present Illness Pt is 84 yo female s/p L TKA, WBAT. PMH of COPD, SOB, HTN, CHF, CKD, hypothyroidism, afib, breast cx    PT Comments    Patient easily woken at start of session, agreeable to PT states her L knee pain 7/10. Patient able to participate in bed level therapeutic exercises, but needed more physical assist this session for heel slides, unable to perform SLR independently. Dr. Roland Rack entered room, gave verbal consent to utilize [place] as needed, as well as approval for continued mobilization prior to [place] physician requesting. Patient needed min-modAx1 throughout mobility/ambulation, extensive verbal cues for posture, AD management and maximum encouragement for confidence. Heavy use of RW utilized for standing/ambulation as well. The patient would benefit from continued skilled PT to address functional limitations to return patient to PLOF.     Follow Up Recommendations  SNF     Equipment Recommendations  None recommended by PT;Other (comment)    Recommendations for Other Services       Precautions / Restrictions Precautions Precautions: Knee Restrictions Weight Bearing Restrictions: Yes LLE Weight Bearing: Weight bearing as tolerated    Mobility  Bed Mobility Overal bed mobility: Needs Assistance Bed Mobility: Supine to Sit     Supine to sit: Min assist     General bed mobility comments: Patient needed minAx1 for LE management  Transfers Overall transfer level: Needs assistance Equipment used: Rolling walker (2 wheeled) Transfers: Sit to/from Stand Sit to Stand: Mod assist         General transfer comment: Heavy use of chair arms, Pt with very little weight bearing on LLE. difficulty transferring hands from chair to RW, cues for posture needed. stand to sit modAx1  Ambulation/Gait Ambulation/Gait assistance: Min assist Gait  Distance (Feet): 2 Feet Assistive device: Rolling walker (2 wheeled) Gait Pattern/deviations: Shuffle     General Gait Details: Patient needed extensive verbal cues, min-modAx1 during ambulation, cues for upright posture. Very little to no weight bearing on LLE.   Stairs             Wheelchair Mobility    Modified Rankin (Stroke Patients Only)       Balance Overall balance assessment: Needs assistance   Sitting balance-[name]: Poor       Standing balance-[name]: Poor                              Cognition Arousal/Alertness: Awake/alert Behavior During Therapy: WFL for tasks assessed/performed Overall Cognitive Status: Within Functional Limits for tasks assessed                                        Exercises Total Joint Exercises Ankle Circles/Pumps: AROM;Both;20 reps Quad Sets: AROM;Both;20 reps Short Arc Quad: AROM;Left;10 reps Heel Slides: AAROM;Left;10 reps Hip ABduction/ADduction: Left;AROM;10 reps Straight Leg Raises: AAROM;10 reps;Left Goniometric ROM: L knee extesion: -5 L knee flexion: 20deg (supine in bed compared to EOB last sesion) Other Exercises Other Exercises: Patient performed x2 sit to stands from bedside commode this session, second attempt minAx1, first attempt mod Ax1    General Comments        Pertinent Vitals/Pain Pain Assessment: 0-10 Pain Score: 7  Pain Location: posterior L knee pain Pain Descriptors / Indicators:  Sharp;Aching Pain Intervention(s): Limited activity within patient's tolerance;Monitored during session;Repositioned;RN gave pain meds during session;Ice applied    Home Living                      Prior Function            PT Goals (current goals can now be found in the care plan section) Progress towards PT goals: Progressing toward goals(progressing slowly)    Frequency    BID      PT Plan Current plan remains appropriate    Co-evaluation               AM-PAC PT "6 Clicks" Daily Activity  Outcome Measure  Difficulty turning over in bed (including adjusting bedclothes, sheets and blankets)?: A Little Difficulty moving from lying on back to sitting on the side of the bed? : Unable Difficulty sitting down on and standing up from a chair with arms (e.g., wheelchair, bedside commode, etc,.)?: Unable Help needed moving to and from a bed to chair (including a wheelchair)?: A Lot Help needed walking in hospital room?: A Lot Help needed climbing 3-5 steps with a railing? : Total 6 Click Score: 10    End of Session Equipment Utilized During Treatment: Gait belt Activity Tolerance: Patient limited by pain;Patient limited by fatigue Patient left: Other (comment)(Pt on bedside commode with nursing in room) Nurse Communication: Mobility status PT Visit Diagnosis: Unsteadiness on feet (R26.81);Difficulty in walking, not elsewhere classified (R26.2);Muscle weakness (generalized) (M62.81);Pain Pain - Right/Left: Left Pain - part of body: Knee     Time: 0823-0913 PT Time Calculation (min) (ACUTE ONLY): 50 min  Charges:  $Therapeutic Exercise: 8-22 mins $Therapeutic Activity: 23-37 mins                     Lieutenant Diego PT, DPT 10:41 AM,02/28/18 [phone]

## 2018-03-01 ENCOUNTER — Encounter: Payer: Self-pay | Admitting: Nurse Practitioner

## 2018-03-01 ENCOUNTER — Inpatient Hospital Stay (HOSPITAL_COMMUNITY)
Admission: RE | Admit: 2018-03-01 | Discharge: 2018-03-01 | Disposition: A | Discharge status: Home / Self Care | Payer: Medicare HMO | Source: Ambulatory Visit | Attending: Internal Medicine | Admitting: Internal Medicine

## 2018-03-01 DIAGNOSIS — I340 Nonrheumatic mitral (valve) insufficiency: Secondary | ICD-10-CM

## 2018-03-01 DIAGNOSIS — I4891 Unspecified atrial fibrillation: Secondary | ICD-10-CM

## 2018-03-01 LAB — CBC
HEMATOCRIT: 31.8 % — AB (ref 36.0–46.0)
HEMOGLOBIN: 10 g/dL — AB (ref 12.0–15.0)
MCH: 32.4 pg (ref 26.0–34.0)
MCHC: 31.4 g/dL (ref 30.0–36.0)
MCV: 102.9 fL — ABNORMAL HIGH (ref 80.0–100.0)
Platelets: 218 10*3/uL (ref 150–400)
RBC: 3.09 MIL/uL — AB (ref 3.87–5.11)
RDW: 13.4 % (ref 11.5–15.5)
WBC: 11 10*3/uL — AB (ref 4.0–10.5)
nRBC: 0 % (ref 0.0–0.2)

## 2018-03-01 LAB — TROPONIN I

## 2018-03-01 LAB — BASIC METABOLIC PANEL
ANION GAP: 7 (ref 5–15)
BUN: 25 mg/dL — ABNORMAL HIGH (ref 8–23)
CHLORIDE: 104 mmol/L (ref 98–111)
CO2: 27 mmol/L (ref 22–32)
Calcium: 8.9 mg/dL (ref 8.9–10.3)
Creatinine, Ser: 1.53 mg/dL — ABNORMAL HIGH (ref 0.44–1.00)
GFR calc Af Amer: 35 mL/min — ABNORMAL LOW (ref >60)
GFR, EST NON AFRICAN AMERICAN: 30 mL/min — AB (ref >60)
GLUCOSE: 102 mg/dL — AB (ref 70–99)
POTASSIUM: 3.7 mmol/L (ref 3.5–5.1)
Sodium: 138 mmol/L (ref 135–145)

## 2018-03-01 LAB — ECHOCARDIOGRAM COMPLETE
HEIGHTINCHES: 68 in
WEIGHTICAEL: 2000 [oz_av]

## 2018-03-01 MED ORDER — METOPROLOL TARTRATE 25 MG PO TABS
25.0000 mg | ORAL_TABLET | Freq: Once | ORAL | Status: AC
  Administered 2018-03-01: 25 mg via ORAL
  Filled 2018-03-01: qty 1

## 2018-03-01 MED ORDER — AMIODARONE HCL 200 MG PO TABS
200.0000 mg | ORAL_TABLET | Freq: Two times a day (BID) | ORAL | Status: DC
  Administered 2018-03-01 – 2018-03-02 (×2): 200 mg via ORAL
  Filled 2018-03-01 (×2): qty 1

## 2018-03-01 MED ORDER — METOPROLOL TARTRATE 50 MG PO TABS
50.0000 mg | ORAL_TABLET | Freq: Two times a day (BID) | ORAL | Status: DC
  Administered 2018-03-01 – 2018-03-02 (×2): 50 mg via ORAL
  Filled 2018-03-01 (×2): qty 1

## 2018-03-01 MED ORDER — METOPROLOL TARTRATE 25 MG PO TABS: 25.0000 mg | ORAL_TABLET | Freq: Two times a day (BID) | ORAL | Status: DC

## 2018-03-01 MED ORDER — APIXABAN 5 MG PO TABS: 5.0000 mg | ORAL_TABLET | Freq: Two times a day (BID) | ORAL | 0 refills | Status: DC

## 2018-03-01 MED ORDER — APIXABAN 5 MG PO TABS: 10.0000 mg | ORAL_TABLET | Freq: Two times a day (BID) | ORAL | 0 refills | Status: DC

## 2018-03-01 NOTE — Care Management Important Message (Signed)
Important Message  Patient Details  Name: [name] MRN: [id number] Date of Birth: Nov 10, 1932   Medicare Important Message Given:  Yes    Juliann Pulse A Giankarlo Leamer 03/01/2018, 10:36 AM

## 2018-03-01 NOTE — Progress Notes (Signed)
Per [facility] admissions coordinator at [facility] authorization is still pending and it went to the medical director for review.   [employer], LCSW [phone]

## 2018-03-01 NOTE — Progress Notes (Signed)
PT Cancellation Note  Patient Details Name: Emree Locicero Fergusson MRN: [id number] DOB: 10-22-1932   Cancelled Treatment:    Reason Eval/Treat Not Completed: Patient not medically ready.  Per discussion with nurse, will hold PT at this time (pt got up to [facility] with nursing assist recently and pt's HR increased and pt was symptomatic); MD aware.  Will re-attempt PT treatment session later today as medically appropriate.    Leitha Bleak, PT 03/01/18, 12:08 PM [phone]

## 2018-03-01 NOTE — Progress Notes (Signed)
Subjective: 3 Days Post-Op Procedure(s) (LRB): TOTAL KNEE ARTHROPLASTY (Left) Patient reports pain as moderate.   Patient is being treated for a DVT in the left lower extremity.  She is presently on Eliquis instead of Lovenox.  She is improving with swelling. PT and care management to assist with discharge planning. Negative for chest pain and shortness of breath Fever: no Gastrointestinal:Negative for nausea and vomiting  Objective: Vital signs in last 24 hours: Temp:  [97.6 F (36.4 C)-98.6 F (37 C)] 98.1 F (36.7 C) (11/14 2328) Pulse Rate:  [101-121] 112 (11/14 2329) Resp:  [19] 19 (11/14 2328) BP: (128-139)/(60-90) 128/90 (11/14 2328) SpO2:  [96 %-99 %] 99 % (11/14 2328)  Intake/Output from previous day:  Intake/Output Summary (Last 24 hours) at 03/01/2018 0620 Last data filed at 02/28/2018 1253 Gross per 24 hour  Intake -  Output 550 ml  Net -550 ml    Intake/Output this shift: No intake/output data recorded.  Labs: Recent Labs    02/27/18 0334  HGB 9.7*   Recent Labs    02/27/18 0334  WBC 7.6  RBC 2.99*  HCT 30.3*  PLT 184   Recent Labs    02/28/18 0434 03/01/18 0406  NA 142 138  K 3.7 3.7  CL 107 104  CO2 27 27  BUN 23 25*  CREATININE 1.50* 1.53*  GLUCOSE 109* 102*  CALCIUM 9.0 8.9   Recent Labs    02/26/18 0643  INR 1.02     EXAM General - Patient is Alert, Appropriate and Oriented Extremity -sensation intact. Ankle range of motion is intact.  Positive swelling involving the left lower extremity.  Negative Homans test. Dressing/Incision - clean, dry, no drainage with surgical bandage removed. Motor Function - intact, moving foot and toes well on exam.   Ambulated 8 feet.   Past Medical History:  Diagnosis Date  . Anemia   . Arthritis    "back, hands" (09/08/2015)  . Atrial fibrillation ([place])    Echocardiogram 5/12: normal LV function, moderate LAE, mild RAE, mild AI and MR and mild-to-moderate TR.  Myoview 5/ 12 showed an  ejection fraction of 73% and normal perfusion.   Patient underwent elective DCCV on 08/26/10  . Breast cancer ([place]) 2007, 2013   right breast- 2007 radiation-mastectomy  . Cancer of right breast ([place])   . Chronic kidney disease   . Chronic lower back pain   . CKD (chronic kidney disease), stage IV ([place])   . Complication of anesthesia    "took me about 1 week to know what was going on after one of my knee ORs"  . COPD (chronic obstructive pulmonary disease) ([place])   . DDD (degenerative disc disease), lumbar   . Depression   . 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 Dyspnea    DOE  . Dysrhythmia    AFIB  . Edema    FEET/ANKLES  . GERD (gastroesophageal reflux disease)   . Hyperlipidemia   . Hypertension   . Hypothyroid   . PAD (peripheral artery disease) ([place])   . PVD (peripheral vascular disease) ([place])   . Sepsis ([place])    UTI/ECOLI I WEEK AGO   Assessment/Plan: 3 Days Post-Op Procedure(s) (LRB): TOTAL KNEE ARTHROPLASTY (Left) Active Problems:   Status post total knee replacement using cement, left  Estimated body mass index is 19.01 kg/m as calculated from the following:   Height as of this encounter: 5\' 8"  (1.727 m).   Weight as of this  encounter: 56.7 kg. Advance diet Up with therapy D/C IV fluids when tolerating po intake.  Up with therapy today. Plan to discharge to rehab today.  DVT Prophylaxis -Eliquis and Ted support hose. Weight-Bearing as tolerated to left leg  Reche Dixon, PA-C [facility] Orthopaedic Surgery 03/01/2018, 6:20 AM

## 2018-03-01 NOTE — Consult Note (Signed)
Cardiology Consult    Patient ID: [name] MRN: [id number], DOB/AGE: 82/16/1934   Admit date: 02/26/2018 Date of Consult: 03/01/2018  Primary Physician: Rusty Aus, MD Primary Cardiologist: Kathlyn Sacramento, MD Requesting Provider: Q. Bridgett Larsson, MD  Patient Profile    [name] is a 84 y.o. female with a history of PAD, PAF, HTN, HL, HFpEF, CKD IV, COPD, DDD, depression, anemia, falls, and R breast cancer, who is being seen today for the evaluation of rapid Afib at the request of Dr. Bridgett Larsson.  Past Medical History   Past Medical History:  Diagnosis Date  . (HFpEF) heart failure with preserved ejection fraction ([place])    a. 12/2015 Echo: EF 60-65%. no rwma, Gr2 DD, triv AI, mild MR, mildly dil LA. Mild-mod TR. PASP 79mmHg.  Mar[place] Kitchen Anemia   . Arthritis    "back, hands" (09/08/2015)  . Cancer of right breast [facility]) 2007, 2013   right breast- 2007 radiation-mastectomy  . Chronic lower back pain   . CKD (chronic kidney disease), stage IV ([place])   . Complication of anesthesia    "took me about 1 week to know what was going on after one of my knee ORs"  . COPD (chronic obstructive pulmonary disease) ([place])   . DDD (degenerative disc disease), lumbar   . Depression   . 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 Edema    FEET/ANKLES  . GERD (gastroesophageal reflux disease)   . History of sepsis   . History of stress test    a. 08/2010: EF 73%, no ischemia/infarct.  . Hyperlipidemia   . Hypertension   . Hypothyroid   . PAD (peripheral artery disease) ([place])    a. 08/2015 s/p PTA of R AT w/ drug-coated balloon angioplasty to R Popliteal; b. 05/2017 ABI: R 1.16, L 0.88-->stable.  Marland Kitchen PAF (paroxysmal atrial fibrillation) ([place])    a. 08/2010 s/p DCCV;  b. CHA2DS2VASc = 6-->No OAC 2/2 h/o falls.    Past Surgical History:  Procedure Laterality Date  . ABDOMINAL EXPLORATION SURGERY     "had to go back in after hysterectomy & check on stitch dr had put near my bladder; don't know  if they took it out; had to wear catheter for 1 month"  . ABDOMINAL HYSTERECTOMY    . APPENDECTOMY    . BACK SURGERY    . BALLOON ANGIOPLASTY, ARTERY Right 09/08/2015   superficial femoral  . BREAST EXCISIONAL BIOPSY Right 2007   positive  . BREAST SURGERY    . CARDIOVERSION  11/10/2011   Procedure: CARDIOVERSION;  Surgeon: Lelon Perla, MD;  Location: [facility];  Service: Cardiovascular;  Laterality: N/A;  . CARDIOVERSION  08/26/2010  . CATARACT EXTRACTION W/PHACO Right 07/17/2017   Procedure: CATARACT EXTRACTION PHACO AND INTRAOCULAR LENS PLACEMENT (IOC);  Surgeon: Birder Robson, MD;  Location: [facility];  Service: Ophthalmology;  Laterality: Right;  [place] 00:44.0 AP% 14.6 CDE 6.44 FLUID PACK LOT # [id number]  . CATARACT EXTRACTION W/PHACO Left 08/07/2017   Procedure: CATARACT EXTRACTION PHACO AND INTRAOCULAR LENS PLACEMENT (IOC);  Surgeon: Birder Robson, MD;  Location: [facility];  Service: Ophthalmology;  Laterality: Left;  [place] 00:44 AP% 14.1 CDE 6.32 Fluid pack lot # [id number]  . COLONOSCOPY WITH PROPOFOL N/A 01/04/2016   Procedure: COLONOSCOPY WITH PROPOFOL;  Surgeon: Lollie Sails, MD;  Location: [facility];  Service: Endoscopy;  Laterality: N/A;  . HALLUX VALGUS CORRECTION    . JOINT REPLACEMENT    . LUMBAR  [place] SURGERY    . MASTECTOMY Right 2013   positive  . PERIPHERAL VASCULAR CATHETERIZATION N/A 09/08/2015   Procedure: Abdominal Aortogram w/Lower Extremity;  Surgeon: Wellington Hampshire, MD;  Location: [facility];  Service: Cardiovascular;  Laterality: N/A;  . PERIPHERAL VASCULAR CATHETERIZATION  09/08/2015   Procedure: Peripheral Vascular Balloon Angioplasty;  Surgeon: Wellington Hampshire, MD;  Location: [facility];  Service: Cardiovascular;;  . REVISION TOTAL KNEE ARTHROPLASTY Right   . rt.ankle ulcer surgery  2013  . TEMPOROMANDIBULAR JOINT ARTHROPLASTY    . TMJ ARTHROPLASTY    . TONSILLECTOMY    . TOTAL KNEE ARTHROPLASTY Right   . TOTAL KNEE ARTHROPLASTY  Left 02/26/2018   Procedure: TOTAL KNEE ARTHROPLASTY;  Surgeon: Corky Mull, MD;  Location: [facility];  Service: Orthopedics;  Laterality: Left;  Mar[place] Kitchen VASCULAR SURGERY       Allergies  Allergies  Allergen Reactions  . Other Other (See Comments)    NO BP, VENIPUNCTURE, OR ACCESS IN RIGHT ARM - S/P MASTECTOMY ON RIGHT  . Amlodipine Nausea And Vomiting  . Ciprofloxacin Nausea Only  . Doxycycline Itching and Other (See Comments)    shaky  . Lipitor [Atorvastatin] Other (See Comments)    Joint pain   . Morphine And Related Nausea And Vomiting  . Macrobid [Nitrofurantoin Macrocrystal] Rash  . Penicillins Rash and Other (See Comments)    Has patient had a PCN reaction causing immediate rash, facial/tongue/throat swelling, SOB or lightheadedness with hypotension: yes Has patient had a PCN reaction causing severe rash involving mucus membranes or skin necrosis: no Has patient had a PCN reaction that required hospitalization no Has patient had a PCN reaction occurring within the last 10 years: no If all of the above answers are "NO", then may proceed with Cephalosporin use.   . Sulfa Antibiotics Rash    History of Present Illness    84 y/o ? with the above complex PMH including PAD s/p prior PA to the R AT and popliteal in 08/2015, PAF s/p DCCV in 2012 (chronic amio; not prev on OAC 2/2 falls/anemia), HTN, HL, GERD, R breast cancer, HFpEF, COPD, DDD, hypothyroidism, depression, and CKD III-IV. Prior echo in 12/2015 showed nl EF w/ Gr2 DD.  She was last seen in cardiology clinic in 01/2017, @ which time she was doing well and maintaining sinus rhythm.  ABI's in 05/2017 were stable.  She says that at home, she been doing well from a cardiac standpoint without any paroxysms of A. fib, chest pain, or dyspnea.   In the setting of progressive degenerative joint disease and osteoarthritis of the left knee, patient underwent elective left total knee arthroplasty on November 12.  She did well  postoperatively however, on November 14, she was noted to have a swollen left lower extremity with tenderness in the posterior calf region and a positive Homans sign.  Venous ultrasound was performed and was positive for DVT in the left catheter in the left peroneal vein.  In that setting, she was placed on Eliquis therapy.  She was also noted to have elevated heart rates in the low 100s and then increasing into the 120s but later in the day.  Patient felt lightheaded with heart rates.  This morning, ECG was performed and revealed rapid atrial fibrillation.  Telemetry was then ordered as well as an echo.  Patient currently remains in atrial fibrillation and is resting in bed without any chest pain, dyspnea, or palpitations.  Husband is at  bedside.  Inpatient Medications    . amiodarone  200 mg Oral Daily  . apixaban  10 mg Oral BID  . aspirin EC  81 mg Oral Daily  . calcium-vitamin D  1 tablet Oral Q breakfast  . cholecalciferol  5,000 Units Oral Daily  . docusate sodium  100 mg Oral BID  . famotidine  10 mg Oral Daily  . ferrous sulfate  325 mg Oral Q breakfast  . Influenza vac split quadrivalent PF  0.5 mL Intramuscular Tomorrow-1000  . lactose free nutrition  237 mL Oral Daily  . levothyroxine  150 mcg Oral QAC breakfast  . lisinopril  10 mg Oral Daily  . metoprolol tartrate  25 mg Oral BID  . multivitamin with minerals  1 tablet Oral Daily  . pantoprazole  40 mg Oral Daily  . potassium chloride  10 mEq Oral BID  . rosuvastatin  10 mg Oral Daily  . spironolactone  25 mg Oral Daily  . torsemide  20 mg Oral Daily  . vitamin C  250 mg Oral Daily    Family History    Family History  Problem Relation Age of Onset  . Heart attack Father   . Coronary artery disease Sister 85       MI  . Breast cancer Maternal Aunt    She indicated that her mother is deceased. She indicated that her father is deceased. She indicated that the status of her sister is unknown. She indicated that the status  of her maternal aunt is unknown.   Social History    Social History   Socioeconomic History  . Marital status: Married    Spouse name: Not on file  . Number of children: 2  . Years of education: Not on file  . Highest education level: Not on file  Occupational History  . Not on file  Social Needs  . Financial resource strain: Not on file  . Food insecurity:    Worry: Not on file    Inability: Not on file  . Transportation needs:    Medical: Not on file    Non-medical: Not on file  Tobacco Use  . Smoking status: Never Smoker  . Smokeless tobacco: Never Used  Substance and Sexual Activity  . Alcohol use: No  . Drug use: No  . Sexual activity: Never    Birth control/protection: Post-menopausal  Lifestyle  . Physical activity:    Days per week: Not on file    Minutes per session: Not on file  . Stress: Not on file  Relationships  . Social connections:    Talks on phone: Not on file    Gets together: Not on file    Attends religious service: Not on file    Active member of club or organization: Not on file    Attends meetings of clubs or organizations: Not on file    Relationship status: Not on file  . Intimate partner violence:    Fear of current or ex partner: Not on file    Emotionally abused: Not on file    Physically abused: Not on file    Forced sexual activity: Not on file  Other Topics Concern  . Not on file  Social History Narrative  . Not on file     Review of Systems    General:  No chills, fever, night sweats or weight changes.  Cardiovascular:  No chest pain, dyspnea on exertion, edema, orthopnea, palpitations, paroxysmal nocturnal dyspnea.  Lightheadedness  in the setting of rapid heart rates. Dermatological: No rash, lesions/masses Respiratory: No cough, dyspnea Urologic: No hematuria, dysuria Abdominal:   No nausea, vomiting, diarrhea, bright red blood per rectum, melena, or hematemesis Neurologic:  No visual changes, wkns, somewhat disoriented  in the setting of pain medication since admission. Musculoskeletal: Left knee pain. All other systems reviewed and are otherwise negative except as noted above.  Physical Exam    Blood pressure 119/66, pulse (!) 129, temperature 97.9 F (36.6 C), temperature source Oral, resp. rate 19, height 5\' 8"  (1.727 m), weight 56.7 kg, SpO2 95 %.  General: Pleasant, NAD Psych: Normal affect. Neuro: Alert and oriented X 3. Moves all extremities spontaneously. HEENT: Normal  Neck: Supple without bruits or JVD. Lungs:  Resp regular and unlabored, CTA. Heart: Irregularly irregular, no s3, s4, or murmurs. Abdomen: Soft, non-tender, non-distended, BS + x 4.  Extremities: No clubbing, cyanosis or edema. DP/PT/Radials 1+ and equal bilaterally.  Labs     Lab Results  Component Value Date   WBC 11.0 (H) 03/01/2018   HGB 10.0 (L) 03/01/2018   HCT 31.8 (L) 03/01/2018   MCV 102.9 (H) 03/01/2018   PLT 218 03/01/2018    Recent Labs  Lab 03/01/18 0406  NA 138  K 3.7  CL 104  CO2 27  BUN 25*  CREATININE 1.53*  CALCIUM 8.9  GLUCOSE 102*    Radiology Studies    US Venous Img Lower Unilateral Left  Result Date: 02/28/2018 CLINICAL DATA:  Two days postop left knee arthroplasty. Left lower extremity pain. EXAM: LEFT LOWER EXTREMITY VENOUS DUPLEX ULTRASOUND TECHNIQUE: Doppler venous assessment of the left lower extremity deep venous system was performed, including characterization of spectral flow, compressibility, and phasicity. COMPARISON:  None. FINDINGS: There is complete compressibility of the common femoral, femoral, and popliteal veins. Doppler analysis demonstrates respiratory phasicity and augmentation of flow with calf compression. No obvious superficial vein thrombosis. There is occlusive thrombus within the peroneal vein in the left calf. IMPRESSION: The study is positive for DVT in the left calf within the left peroneal vein. No evidence of femoral or popliteal vein DVT in the left lower  extremity. Electronically Signed   By: Marybelle Killings M.D.   On: 02/28/2018 12:08   Dg Knee Left Port  Result Date: 02/26/2018 CLINICAL DATA:  84 year old female status post left knee arthroplasty. EXAM: PORTABLE LEFT KNEE - 1-2 VIEW COMPARISON:  None. FINDINGS: AP and lateral views at 1013 hours. Left knee total arthroplasty hardware in place, appears intact, and normally aligned. Overlying skin staples and regional postoperative changes to the soft tissues. No unexpected osseous changes. Calcified peripheral vascular disease. IMPRESSION: 1. Left total knee arthroplasty with no adverse features. 2. Calcified peripheral vascular disease. Electronically Signed   By: Genevie Ann M.D.   On: 02/26/2018 10:28    ECG & Cardiac Imaging    Afib, 126, no acute st/t changes - personally reviewed.  Assessment & Plan    1.  PAF: Patient with a history of paroxysmal atrial fibrillation status post cardioversion in 2012.  She is on chronic amiodarone therapy.  She was not previously to correlated in the setting of frequent falls.  She says she falls about 5 times a year.  All are mechanical.  She was admitted on the 12th for elective left total knee arthroplasty and on the 14th, was found to have a left lower extremity DVT and also rapid atrial fibrillation.  She is currently asymptomatic with heart rates in the  120s.  Blood pressure is stable.  She is on beta-blocker and amiodarone therapy.  I am going to increase her amiodarone to 200 mg twice daily.  Continue beta-blocker and titrate if blood pressure allows.  Anticoagulation has been started in the setting of DVT.  If heart rates remain elevated despite medication titration, we may need to consider cardioversion on Monday.  2.  HFpEF: Echo in September 2017 showed normal LV function with grade 2 diastolic dysfunction.  She is currently euvolemic on exam.  Heart rates elevated and thus she may be at risk for increased diastolic failure.  Addressing this as above.   Watch volume status, intake and output, and weights closely.  She is on torsemide.  3.  L LE  DVT: Noted to have left lower extremity swelling and pain on November 14.  Lower extremity ultrasound with DVT in the left cath within the left peroneal vein.  Development of tachycardia/A. fib is potentially concerning for PE as well however, she has not had chest pain, dyspnea/hypoxia.  Echocardiogram has been performed and we will evaluate for elevation in pulmonary pressures or RV dysfunction.  If present, we would consider VQ scan as she is a poor candidate for CTA in the setting of chronic kidney disease.  Eliquis initiated - 10 BID x 7 days.  Typically Eliquis is dropped to 5 mg twice daily after 1 week however in the setting of weight less than 60 kg, creatinine greater than 1.5, and age over 44, she will need 2.5 mg twice daily.  4.  Stage III-IV chronic kidney disease: Stable.  5.  Essential hypertension: Stable.  6.  Hyperlipidemia: LDL was 97 in July 2019.  She remains on statin therapy.  7.  Peripheral arterial disease: Status post right anterior tibial and popliteal angioplasty in May 2017.  ABIs in February of this year were stable.  No recent claudication.  Continue statin.  In the setting of initiation of Eliquis therapy I will discontinue aspirin.  Signed, Murray Hodgkins, NP 03/01/2018, 1:26 PM  For questions or updates, please contact   Please consult [email or web address] for contact info under Cardiology/STEMI.

## 2018-03-01 NOTE — Progress Notes (Signed)
PT Cancellation Note  Patient Details Name: [name] MRN: [id number] DOB: 1932/12/08   Cancelled Treatment:    Reason Eval/Treat Not Completed: Other (comment).  Upon PT arrival to pt's room, pt's HR noted to be 120 bpm at rest.  Discussed pt's HR with pt's nurse who reported she would notify MD.  Will re-attempt PT treatment session later today as medically appropriate.  Leitha Bleak, PT 03/01/18, 10:32 AM [phone]

## 2018-03-01 NOTE — Consult Note (Addendum)
[facility] [place] NAME: [name]    MR#:  [id number]  DATE OF BIRTH:  May 03, 1932  DATE OF ADMISSION:  02/26/2018  PRIMARY CARE PHYSICIAN: Rusty Aus, MD   REQUESTING/REFERRING PHYSICIAN: Dr. Roland Rack.  CHIEF COMPLAINT:  No chief complaint on file.  Tachycardia. HISTORY OF PRESENT ILLNESS:  [name]  is a 84 y.o. female with a known history of anemia, arthritis, A. fib, breast cancer, CKD stage IV, COPD, hypertension, hyperlipidemia, PAD and degenerative disc disease.  The patient is status post left total knee replacement.  She is found tachycardia at 120s.  Dr. Roland Rack request hospitalist consult.  The patient was found left leg DVT yesterday.  The patient is already on Eliquis for A. fib and DVT.  She was given amiodarone without improvement. We will give Lopressor now.  The patient denies any headache, dizziness, chest pain, palpitation or shortness of breath.  She just feels generalized weakness and poor appetite. PAST MEDICAL HISTORY:   Past Medical History:  Diagnosis Date  . Anemia   . Arthritis    "back, hands" (09/08/2015)  . Atrial fibrillation ([place])    Echocardiogram 5/12: normal LV function, moderate LAE, mild RAE, mild AI and MR and mild-to-moderate TR.  Myoview 5/ 12 showed an ejection fraction of 73% and normal perfusion.   Patient underwent elective DCCV on 08/26/10  . Breast cancer ([place]) 2007, 2013   right breast- 2007 radiation-mastectomy  . Cancer of right breast ([place])   . Chronic kidney disease   . Chronic lower back pain   . CKD (chronic kidney disease), stage IV ([place])   . Complication of anesthesia    "took me about 1 week to know what was going on after one of my knee ORs"  . COPD (chronic obstructive pulmonary disease) ([place])   . DDD (degenerative disc disease), lumbar   . Depression   . 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 Dyspnea    DOE  . Dysrhythmia    AFIB  . Edema    FEET/ANKLES  . GERD (gastroesophageal reflux disease)   . Hyperlipidemia   . Hypertension   . Hypothyroid   . PAD (peripheral artery disease) ([place])   . PVD (peripheral vascular disease) ([place])   . Sepsis ([place])    UTI/ECOLI I WEEK AGO    PAST SURGICAL HISTORY:   Past Surgical History:  Procedure Laterality Date  . ABDOMINAL EXPLORATION SURGERY     "had to go back in after hysterectomy & check on stitch dr had put near my bladder; don't know if they took it out; had to wear catheter for 1 month"  . ABDOMINAL HYSTERECTOMY    . APPENDECTOMY    . BACK SURGERY    . BALLOON ANGIOPLASTY, ARTERY Right 09/08/2015   superficial femoral  . BREAST EXCISIONAL BIOPSY Right 2007   positive  . BREAST SURGERY    . CARDIOVERSION  11/10/2011   Procedure: CARDIOVERSION;  Surgeon: Lelon Perla, MD;  Location: [facility];  Service: Cardiovascular;  Laterality: N/A;  . CARDIOVERSION  08/26/2010  . CATARACT EXTRACTION W/PHACO Right 07/17/2017   Procedure: CATARACT EXTRACTION PHACO AND INTRAOCULAR LENS PLACEMENT (IOC);  Surgeon: Birder Robson, MD;  Location: [facility];  Service: Ophthalmology;  Laterality: Right;  [place] 00:44.0 AP% 14.6 CDE 6.44 FLUID PACK LOT # [id number]  . CATARACT EXTRACTION W/PHACO Left 08/07/2017   Procedure: CATARACT EXTRACTION PHACO AND INTRAOCULAR LENS PLACEMENT (  [facility]);  Surgeon: Birder Robson, MD;  Location: [facility];  Service: Ophthalmology;  Laterality: Left;  [place] 00:44 AP% 14.1 CDE 6.32 Fluid pack lot # [id number]  . COLONOSCOPY WITH PROPOFOL N/A 01/04/2016   Procedure: COLONOSCOPY WITH PROPOFOL;  Surgeon: Lollie Sails, MD;  Location[facility];  Service: Endoscopy;  Laterality: N/A;  . HALLUX VALGUS CORRECTION    . JOINT REPLACEMENT    . LUMBAR DISC SURGERY    . MASTECTOMY Right 2013   positive  . PERIPHERAL VASCULAR CATHETERIZATION N/A 09/08/2015   Procedure: Abdominal Aortogram w/Lower Extremity;  Surgeon: Wellington Hampshire, MD;  Location: [facility];   Service: Cardiovascular;  Laterality: N/A;  . PERIPHERAL VASCULAR CATHETERIZATION  09/08/2015   Procedure: Peripheral Vascular Balloon Angioplasty;  Surgeon: Wellington Hampshire, MD;  Location: [facility];  Service: Cardiovascular;;  . REVISION TOTAL KNEE ARTHROPLASTY Right   . rt.ankle ulcer surgery  2013  . TEMPOROMANDIBULAR JOINT ARTHROPLASTY    . TMJ ARTHROPLASTY    . TONSILLECTOMY    . TOTAL KNEE ARTHROPLASTY Right   . TOTAL KNEE ARTHROPLASTY Left 02/26/2018   Procedure: TOTAL KNEE ARTHROPLASTY;  Surgeon: Corky Mull, MD;  Location: [facility] OR[facility];  Service: Orthopedics;  Laterality: Left;  [facility] VASCULAR SURGERY      SOCIAL HISTORY:   Social History   Tobacco Use  . Smoking status: Never Smoker  . Smokeless tobacco: Never Used  Substance Use Topics  . Alcohol use: No    FAMILY HISTORY:   Family History  Problem Relation Age of Onset  . Heart attack Father   . Coronary artery disease Sister 58       MI  . Breast cancer Maternal Aunt     DRUG ALLERGIES:   Allergies  Allergen Reactions  . Other Other (See Comments)    NO BP, VENIPUNCTURE, OR ACCESS IN RIGHT ARM - S/P MASTECTOMY ON RIGHT  . Amlodipine Nausea And Vomiting  . Ciprofloxacin Nausea Only  . Doxycycline Itching and Other (See Comments)    shaky  . Lipitor [Atorvastatin] Other (See Comments)    Joint pain   . Morphine And Related Nausea And Vomiting  . Macrobid [Nitrofurantoin Macrocrystal] Rash  . Penicillins Rash and Other (See Comments)    Has patient had a PCN reaction causing immediate rash, facial/tongue/throat swelling, SOB or lightheadedness with hypotension: yes Has patient had a PCN reaction causing severe rash involving mucus membranes or skin necrosis: no Has patient had a PCN reaction that required hospitalization no Has patient had a PCN reaction occurring within the last 10 years: no If all of the above answers are "NO", then may proceed with Cephalosporin use.   . Sulfa Antibiotics Rash     REVIEW OF SYSTEMS:   Review of Systems  Constitutional: Positive for malaise/fatigue. Negative for chills and fever.  HENT: Negative for sore throat.   Eyes: Negative for blurred vision and double vision.  Respiratory: Negative for cough, hemoptysis, shortness of breath, wheezing and stridor.   Cardiovascular: Negative for chest pain, palpitations, orthopnea and leg swelling.  Gastrointestinal: Negative for abdominal pain, blood in stool, diarrhea, melena, nausea and vomiting.  Genitourinary: Negative for dysuria, flank pain and hematuria.  Musculoskeletal: Negative for back pain and joint pain.  Skin: Negative for rash.  Neurological: Negative for dizziness, sensory change, focal weakness, seizures, loss of consciousness, weakness and headaches.  Endo/Heme/Allergies: Negative for polydipsia.  Psychiatric/Behavioral: Negative for depression. The patient is not nervous/anxious.  MEDICATIONS AT HOME:   Prior to Admission medications   Medication Sig Start Date End Date Taking? Authorizing Provider  acetaminophen (TYLENOL) 500 MG tablet Take 1,000 mg by mouth at bedtime as needed for moderate pain or headache.   Yes [provider]  amiodarone (PACERONE) 200 MG tablet Take 1 tablet (200 mg total) by mouth daily. 08/04/13  Yes Lelon Perla, MD  aspirin EC 81 MG tablet Take 1 tablet (81 mg total) by mouth daily. 02/26/14  Yes Lelon Perla, MD  calcium-vitamin D (OSCAL WITH D) 500-200 MG-UNIT per tablet Take 1 tablet by mouth daily with breakfast.   Yes [provider]  Cholecalciferol (VITAMIN D3) 5000 units CAPS Take 5,000 Units by mouth daily.    Yes [provider]  docusate sodium (COLACE) 100 MG capsule Take 100 mg by mouth daily as needed for mild constipation.   Yes [provider]  Iron-Vitamin C 65-125 MG TABS Take 1 tablet by mouth daily.   Yes [provider]  lactose free nutrition (BOOST) LIQD Take 237 mLs by mouth  daily.   Yes [provider]  levothyroxine (SYNTHROID, LEVOTHROID) 150 MCG tablet Take 150 mcg by mouth daily before breakfast.   Yes [provider]  lisinopril (PRINIVIL,ZESTRIL) 10 MG tablet Take 10 mg by mouth daily. 08/15/14  Yes [provider]  Multiple Vitamin (MULTIVITAMIN) tablet Take 1 tablet by mouth daily.     Yes [provider]  pantoprazole (PROTONIX) 40 MG tablet Take 1 tablet (40 mg total) by mouth daily. 10/01/15  Yes Wellington Hampshire, MD  potassium chloride (K-DUR) 10 MEQ tablet Take 10 mEq by mouth 2 (two) times daily.    Yes [provider]  ranitidine (ZANTAC) 300 MG tablet Take 300 mg by mouth at bedtime.   Yes [provider]  spironolactone (ALDACTONE) 25 MG tablet Take 25 mg by mouth daily.   Yes [provider]  torsemide (DEMADEX) 20 MG tablet Take 20 mg by mouth daily.  07/24/16  Yes [provider]  apixaban (ELIQUIS) 5 MG TABS tablet Take 1 tablet (5 mg total) by mouth 2 (two) times daily. 03/08/18   Reche Dixon, PA-C  apixaban (ELIQUIS) 5 MG TABS tablet Take 2 tablets (10 mg total) by mouth 2 (two) times daily for 7 days. 03/01/18 03/08/18  Reche Dixon, PA-C  ondansetron (ZOFRAN) 4 MG tablet Take 1 tablet (4 mg total) by mouth every 6 (six) hours as needed for nausea. 02/27/18   Lattie Corns, PA-C  oxyCODONE (OXY IR/ROXICODONE) 5 MG immediate release tablet Take 1-2 tablets (5-10 mg total) by mouth every 4 (four) hours as needed for moderate pain. 02/27/18   Lattie Corns, PA-C  rosuvastatin (CRESTOR) 10 MG tablet Take 1 tablet (10 mg total) by mouth daily. 02/26/18   Wellington Hampshire, MD  traMADol (ULTRAM) 50 MG tablet Take 1 tablet (50 mg total) by mouth every 6 (six) hours as needed for moderate pain. 02/27/18   Lattie Corns, PA-C      VITAL SIGNS:  Blood pressure 119/66, pulse (!) 129, temperature 97.9 F (36.6 C), temperature source Oral, resp. rate 19, height 5\' 8"   (1.727 m), weight 56.7 kg, SpO2 95 %.  PHYSICAL EXAMINATION:  Physical Exam  GENERAL:  84 y.o.-year-old patient lying in the bed with no acute distress.  EYES: Pupils equal, round, reactive to light and accommodation. No scleral icterus. Extraocular muscles intact.  HEENT: Head atraumatic, normocephalic. Oropharynx  and nasopharynx clear.  NECK:  Supple, no jugular venous distention. No thyroid enlargement, no tenderness.  LUNGS: Normal breath sounds bilaterally, no wheezing, rales,rhonchi or crepitation. No use of accessory muscles of respiration.  CARDIOVASCULAR: Irregular rate rhythm, tachycardia. No murmurs, rubs, or gallops.  ABDOMEN: Soft, nontender, nondistended. Bowel sounds present. No organomegaly or mass.  EXTREMITIES: No pedal edema, cyanosis, or clubbing.  Left knee in dressing. NEUROLOGIC: Cranial nerves II through XII are intact. Muscle strength 5/5 in all extremities. Sensation intact. Gait not checked.  PSYCHIATRIC: The patient is alert and oriented x 3.  SKIN: No obvious rash, lesion, or ulcer.   LABORATORY PANEL:   CBC Recent Labs  Lab 02/27/18 0334  WBC 7.6  HGB 9.7*  HCT 30.3*  PLT 184   ------------------------------------------------------------------------------------------------------------------  Chemistries  Recent Labs  Lab 03/01/18 0406  NA 138  K 3.7  CL 104  CO2 27  GLUCOSE 102*  BUN 25*  CREATININE 1.53*  CALCIUM 8.9   ------------------------------------------------------------------------------------------------------------------  Cardiac Enzymes No results for input(s): TROPONINI in the last 168 hours. ------------------------------------------------------------------------------------------------------------------  RADIOLOGY:  US Venous Img Lower Unilateral Left  Result Date: 02/28/2018 CLINICAL DATA:  Two days postop left knee arthroplasty. Left lower extremity pain. EXAM: LEFT LOWER EXTREMITY VENOUS DUPLEX ULTRASOUND TECHNIQUE:  Doppler venous assessment of the left lower extremity deep venous system was performed, including characterization of spectral flow, compressibility, and phasicity. COMPARISON:  None. FINDINGS: There is complete compressibility of the common femoral, femoral, and popliteal veins. Doppler analysis demonstrates respiratory phasicity and augmentation of flow with calf compression. No obvious superficial vein thrombosis. There is occlusive thrombus within the peroneal vein in the left calf. IMPRESSION: The study is positive for DVT in the left calf within the left peroneal vein. No evidence of femoral or popliteal vein DVT in the left lower extremity. Electronically Signed   By: Marybelle Killings M.D.   On: 02/28/2018 12:08      IMPRESSION AND PLAN:   A. fib with RVR. Continue amiodarone, Lopressor twice daily, continue Eliquis.  Echocardiograph.  Cardiology consult.  DVT in the left calf within the left peroneal vein.  Continue Eliquis.  Hypertension.  Controlled continue home hypertension medication.  Hyperlipidemia.  Continue aspirin and Crestor.  Anemia of chronic disease.  Hemoglobin is stable near baseline.  Check hemoglobin.  CKD.  Stage III.  Stable.  COPD.  Stable.  All the records are reviewed and case discussed with ED provider. Management plans discussed with the patient, family and they are in agreement.  CODE STATUS: DNR. TOTAL TIME TAKING CARE OF THIS PATIENT: 35 minutes.    Demetrios Loll M.D on 03/01/2018 at 12:55 PM  Between 7am to 6pm - Pager - [phone]  After 6pm go to [email or web address] - Proofreader  [facility] [place] [facility]  Office  [phone]  CC: Primary care physician; Rusty Aus, MD   Note: This dictation was prepared with Dragon dictation along with smaller phrase technology. Any transcriptional errors that result from this process are unin

## 2018-03-01 NOTE — Care Management (Signed)
Discharge cancelled due to increase in heart rate upper 120's with minimal activity. Cardiology consulting.  Changes in cardiac medications. Diagnosed with DVT left on 11/14- two days post op and placed on Eliquis.  Going to skilled nursing facility at discharge. Hope to discharge within next 24 hours if cardiac status is stable

## 2018-03-01 NOTE — Progress Notes (Signed)
*  PRELIMINARY RESULTS* Echocardiogram 2D Echocardiogram has been performed.  Sherrie Sport 03/01/2018, 2:09 PM

## 2018-03-01 NOTE — Progress Notes (Signed)
Per [facility] admissions coordinator at [facility] authorization has been received. Per MD patient will likely D/C tomorrow pending medical clearance. Per Lovena Le patient can come to [facility] over the weekend to room 213. Patient and her husband [name] are aware of above.   [employer], LCSW [phone]

## 2018-03-01 NOTE — Plan of Care (Signed)
  Problem: Education: Goal: Knowledge of General Education information will improve Description: Including pain rating scale, medication(s)/side effects and non-pharmacologic comfort measures Outcome: Progressing   Problem: Health Behavior/Discharge Planning: Goal: Ability to manage health-related needs will improve Outcome: Progressing   Problem: Activity: Goal: Risk for activity intolerance will decrease Outcome: Progressing   Problem: Nutrition: Goal: Adequate nutrition will be maintained Outcome: Progressing   Problem: Coping: Goal: Level of anxiety will decrease Outcome: Progressing   Problem: Pain Managment: Goal: General experience of comfort will improve Outcome: Progressing   Problem: Skin Integrity: Goal: Risk for impaired skin integrity will decrease Outcome: Progressing   

## 2018-03-01 NOTE — Progress Notes (Signed)
Physical Therapy Treatment Patient Details Name: [name] MRN: [id number] DOB: 04-Jul-1932 Today's Date: 03/01/2018    History of Present Illness Pt is 84 yo female s/p L TKA, WBAT. PMH of COPD, SOB, HTN, CHF, CKD, hypothyroidism, afib, breast cx.    PT Comments    Per discussion with pt's nurse, performed bed level activities d/t concerns over HR issues (and pt symptomatic earlier getting OOB).  Pt tolerated LE ex's in bed fairly well.  L knee flexion to 77 degrees AAROM.  HR 100-124 bpm during session's activities.  O2 sats 97% or greater on room air during session.  Will continue to progress pt with strengthening, knee ROM, and progressive ambulation next session as medically appropriate.    Follow Up Recommendations  SNF     Equipment Recommendations  Rolling walker with 5" wheels    Recommendations for Other Services OT consult     Precautions / Restrictions Precautions Precautions: Knee;Fall Precaution Booklet Issued: Yes (comment) Restrictions Weight Bearing Restrictions: Yes LLE Weight Bearing: Weight bearing as tolerated    Mobility  Bed Mobility  Deferred per discussion with pt's nurse.                Transfers  Deferred per discussion with pt's nurse.                  Ambulation/Gait  Deferred per discussion with pt's nurse.               Stairs             Wheelchair Mobility    Modified Rankin (Stroke Patients Only)       Balance                                            Cognition Arousal/Alertness: Awake/alert Behavior During Therapy: WFL for tasks assessed/performed Overall Cognitive Status: Within Functional Limits for tasks assessed                                        Exercises Total Joint Exercises Ankle Circles/Pumps: AROM;Strengthening;Both;10 reps;Supine Quad Sets: AROM;Strengthening;Both;10 reps;Supine Short Arc Quad: AAROM;Strengthening;Left;10  reps;Supine Heel Slides: AAROM;Strengthening;Left;10 reps;Supine Hip ABduction/ADduction: AAROM;Strengthening;Left;10 reps;Supine Straight Leg Raises: AAROM;Strengthening;Left;10 reps;Supine Goniometric ROM: L knee extension AAROM 8 degrees short of neutral semi-supine in bed; L knee flexion 77 degrees AAROM (pt scooted to L laying in bed with therapist assisting L LE off of bed-with thigh supported-to perform L knee flexion ROM d/t pt with very limited ROM when attempting semi-supine in bed)    General Comments  Pt agreeable to PT session.  Pt's husband present during session.  Pt requesting different white compression hose d/t feeling tight in L ankle (PT attempted to adjust/smooth out any wrinkles; nurse notified); pt's husband requesting CPM (PT deferred d/t pt just received lunch and set-up to eat in bed; nurse notified of CPM request); and bed length shortened to prevent pt sliding down in bed while eating--pt reporting feeling comfortable (nurse notified of need to extend bed once pt was done eating).      Pertinent Vitals/Pain Pain Assessment: 0-10 Pain Score: 8  Pain Location: L ankle more than L knee Pain Descriptors / Indicators: Aching;Sore;Tender Pain Intervention(s): Limited activity within patient's tolerance;Monitored during session;Premedicated before session;Repositioned;Other (comment)(RN notified;  pt declined pain meds)    Home Living                      Prior Function            PT Goals (current goals can now be found in the care plan section) Acute Rehab PT Goals Patient Stated Goal: to improve strength and mobility PT Goal Formulation: With patient Time For Goal Achievement: 03/13/18 Potential to Achieve Goals: Good Progress towards PT goals: Progressing toward goals(with LE ex's)    Frequency    BID      PT Plan Current plan remains appropriate    Co-evaluation              AM-PAC PT "6 Clicks" Daily Activity  Outcome Measure   Difficulty turning over in bed (including adjusting bedclothes, sheets and blankets)?: A Little Difficulty moving from lying on back to sitting on the side of the bed? : A Lot Difficulty sitting down on and standing up from a chair with arms (e.g., wheelchair, bedside commode, etc,.)?: Unable Help needed moving to and from a bed to chair (including a wheelchair)?: A Lot Help needed walking in hospital room?: Total Help needed climbing 3-5 steps with a railing? : Total 6 Click Score: 10    End of Session   Activity Tolerance: Patient limited by pain Patient left: in bed;with call bell/phone within reach;with bed alarm set;with family/visitor present;with SCD's reapplied;Other (comment)(B heels elevated via towel roll; polar care in place and activated) Nurse Communication: Mobility status;Precautions;Other (comment)(Pt's pain and HR status during session.) PT Visit Diagnosis: Unsteadiness on feet (R26.81);Difficulty in walking, not elsewhere classified (R26.2);Muscle weakness (generalized) (M62.81);Pain Pain - Right/Left: Left Pain - part of body: Knee     Time: 1430-1457 PT Time Calculation (min) (ACUTE ONLY): 27 min  Charges:  $Therapeutic Exercise: 23-37 mins                     Leitha Bleak, PT 03/01/18, 4:20 PM [phone]

## 2018-03-01 NOTE — Progress Notes (Signed)
Advanced Care Plan.  Purpose of Encounter: CODE STATUS. Parties in Attendance: The patient, RN and me. Patient's Decisional Capacity: Yes. Medical Story: [name]  is a 84 y.o. female with a known history of anemia, arthritis, A. fib, breast cancer, CKD stage IV, COPD, hypertension, hyperlipidemia, PAD and degenerative disc disease.  The patient was admitted for left knee surgery.  She is status post left total knee replacement.  She developed A. fib with RVR.  Hospitalist was called for consult.  I discussed with the patient about her current condition, prognosis and CODE STATUS.  She stated that she does not want to be resuscitated or intubated if she has a cardiopulmonary arrest.  She said that she is ready to go if [facility] calls her.  I changed from full code to DNR. Plan:  Code Status: DNR. Time spent discussing advance care planning: 17 minutes.

## 2018-03-01 NOTE — Progress Notes (Signed)
Physician Discharge Summary  Subjective: 3 Days Post-Op Procedure(s) (LRB): TOTAL KNEE ARTHROPLASTY (Left) Patient reports pain as moderate.   Patient seen in rounds with Dr. Roland Rack. Patient is well, and has had no acute complaints or problems Patient is ready to go to rehab for physical therapy.  Physician Discharge Summary  Patient ID: [name] MRN: [id number] DOB/AGE: 82/31/1934 82 y.o.  Admit date: 02/26/2018 Discharge date: March 01, 2018  Admission Diagnoses:  Discharge Diagnoses:  Active Problems:   Status post total knee replacement using cement, left   Discharged Condition: Stable  Hospital Course: The patient is postop day 3 from a left total knee replacement.  The patient has ambulated several feet with physical therapy.  The patient has had stable labs.  Her vitals have been stable.  The patient did have a positive DVT for her left lower extremity on day 2 because of significant swelling.  The patient was placed on Eliquis in place of Lovenox.  The patient improved with physical therapy.  The patient has had a bowel movement.  The patient is ready to go to rehab.  Treatments: surgery:  Left TKA using all-cemented [employer] Vanguard system with a 67.5 mm mm PCR femur and a 71 mm tibial tray with a 10 mm AS E-poly insert.  Surgeon:   Pascal Lux, MD  Assistant:   Cameron Proud, PA-C; Phoebe Sharps, PA-S  Anesthesia:   Spinal  Findings:   As above  Complications:   None  EBL:   5 cc  Fluids:   1100 cc crystalloid  UOP:   300 cc  TT:   83 minutes at 300 mmHg  Drains:   None  Closure:   Staples  Implants:   As above  Discharge Exam: Blood pressure 128/90, pulse (!) 112, temperature 98.1 F (36.7 C), temperature source Oral, resp. rate 19, height 5\' 8"  (1.727 m), weight 56.7 kg, SpO2 99 %.   Disposition:    Allergies as of 03/01/2018      Reactions   Other Other (See Comments)   NO BP, VENIPUNCTURE, OR ACCESS IN RIGHT ARM - S/P  MASTECTOMY ON RIGHT   Amlodipine Nausea And Vomiting   Ciprofloxacin Nausea Only   Doxycycline Itching, Other (See Comments)   shaky   Lipitor [atorvastatin] Other (See Comments)   Joint pain   Morphine And Related Nausea And Vomiting   Macrobid [nitrofurantoin Macrocrystal] Rash   Penicillins Rash, Other (See Comments)   Has patient had a PCN reaction causing immediate rash, facial/tongue/throat swelling, SOB or lightheadedness with hypotension: yes Has patient had a PCN reaction causing severe rash involving mucus membranes or skin necrosis: no Has patient had a PCN reaction that required hospitalization no Has patient had a PCN reaction occurring within the last 10 years: no If all of the above answers are "NO", then may proceed with Cephalosporin use.   Sulfa Antibiotics Rash      Medication List    STOP taking these medications   aspirin EC 81 MG tablet     TAKE these medications   acetaminophen 500 MG tablet Commonly known as:  TYLENOL Take 1,000 mg by mouth at bedtime as needed for moderate pain or headache.   amiodarone 200 MG tablet Commonly known as:  PACERONE Take 1 tablet (200 mg total) by mouth daily.   apixaban 5 MG Tabs tablet Commonly known as:  ELIQUIS Take 2 tablets (10 mg total) by mouth 2 (two) times daily for 7 days.  apixaban 5 MG Tabs tablet Commonly known as:  ELIQUIS Take 1 tablet (5 mg total) by mouth 2 (two) times daily. Start taking on:  03/08/2018   calcium-vitamin D 500-200 MG-UNIT tablet Commonly known as:  OSCAL WITH D Take 1 tablet by mouth daily with breakfast.   docusate sodium 100 MG capsule Commonly known as:  COLACE Take 100 mg by mouth daily as needed for mild constipation.   Iron-Vitamin C 65-125 MG Tabs Take 1 tablet by mouth daily.   lactose free nutrition Liqd Take 237 mLs by mouth daily.   levothyroxine 150 MCG tablet Commonly known as:  SYNTHROID, LEVOTHROID Take 150 mcg by mouth daily before breakfast.    lisinopril 10 MG tablet Commonly known as:  PRINIVIL,ZESTRIL Take 10 mg by mouth daily.   multivitamin tablet Take 1 tablet by mouth daily.   ondansetron 4 MG tablet Commonly known as:  ZOFRAN Take 1 tablet (4 mg total) by mouth every 6 (six) hours as needed for nausea.   oxyCODONE 5 MG immediate release tablet Commonly known as:  Oxy IR/ROXICODONE Take 1-2 tablets (5-10 mg total) by mouth every 4 (four) hours as needed for moderate pain.   pantoprazole 40 MG tablet Commonly known as:  PROTONIX Take 1 tablet (40 mg total) by mouth daily.   potassium chloride 10 MEQ tablet Commonly known as:  K-DUR Take 10 mEq by mouth 2 (two) times daily.   ranitidine 300 MG tablet Commonly known as:  ZANTAC Take 300 mg by mouth at bedtime.   rosuvastatin 10 MG tablet Commonly known as:  CRESTOR Take 1 tablet (10 mg total) by mouth daily.   spironolactone 25 MG tablet Commonly known as:  ALDACTONE Take 25 mg by mouth daily.   torsemide 20 MG tablet Commonly known as:  DEMADEX Take 20 mg by mouth daily.   traMADol 50 MG tablet Commonly known as:  ULTRAM Take 1 tablet (50 mg total) by mouth every 6 (six) hours as needed for moderate pain.   Vitamin D3 125 MCG (5000 UT) Caps Take 5,000 Units by mouth daily.            Durable Medical Equipment  (From admission, onward)         Start     Ordered   02/26/18 1143  DME Walker rolling  Once    Question:  Patient needs a walker to treat with the following condition  Answer:  Status post total knee replacement using cement, left   02/26/18 1142   02/26/18 1143  DME Bedside commode  Once    Question:  Patient needs a bedside commode to treat with the following condition  Answer:  Status post total knee replacement using cement, left   02/26/18 1142   02/26/18 1143  DME 3 n 1  Once     02/26/18 1142          Contact information for follow-up providers    Poggi, Marshall Cork, MD Follow up in 14 day(s).   Specialty:  Surgery Why:   Staple Removal. Contact information: [place] [address] [phone]            Contact information for after-discharge care    Destination    [facility] .   Service:  Skilled Nursing Contact information: [address] [place] [phone]                  Signed:  [place] 03/01/2018, 6:19 AM   Objective: Vital signs in last 24 hours: Temp:  [97.6 F (36.4 C)-98.6 F (37 C)] 98.1 F (36.7 C) (11/14 2328) Pulse Rate:  [101-121] 112 (11/14 2329) Resp:  [19] 19 (11/14 2328) BP: (128-139)/(60-90) 128/90 (11/14 2328) SpO2:  [96 %-99 %] 99 % (11/14 2328)  Intake/Output from previous day:  Intake/Output Summary (Last 24 hours) at 03/01/2018 0619 Last data filed at 02/28/2018 1253 Gross per 24 hour  Intake -  Output 550 ml  Net -550 ml    Intake/Output this shift: No intake/output data recorded.  Labs: Recent Labs    02/27/18 0334  HGB 9.7*   Recent Labs    02/27/18 0334  WBC 7.6  RBC 2.99*  HCT 30.3*  PLT 184   Recent Labs    02/28/18 0434 03/01/18 0406  NA 142 138  K 3.7 3.7  CL 107 104  CO2 27 27  BUN 23 25*  CREATININE 1.50* 1.53*  GLUCOSE 109* 102*  CALCIUM 9.0 8.9   Recent Labs    02/26/18 0643  INR 1.02    EXAM: General - Patient is Alert and Oriented Extremity - Intact pulses distally Dorsiflexion/Plantar flexion intact Compartment soft  Minimal calf edema.  Negative Homans test. Incision - clean, dry, no drainage Motor Function -plantarflexion and dorsiflexion are intact  Assessment/Plan: 3 Days Post-Op Procedure(s) (LRB): TOTAL KNEE ARTHROPLASTY (Left) Procedure(s) (LRB): TOTAL KNEE ARTHROPLASTY (Left) Past Medical History:  Diagnosis Date  . Anemia   . Arthritis    "back, hands" (09/08/2015)  . Atrial fibrillation ([place])    Echocardiogram 5/12: normal LV function, moderate LAE, mild RAE, mild AI and MR  and mild-to-moderate TR.  Myoview 5/ 12 showed an ejection fraction of 73% and normal perfusion.   Patient underwent elective DCCV on 08/26/10  . Breast cancer ([place]) 2007, 2013   right breast- 2007 radiation-mastectomy  . Cancer of right breast ([place])   . Chronic kidney disease   . Chronic lower back pain   . CKD (chronic kidney disease), stage IV ([place])   . Complication of anesthesia    "took me about 1 week to know what was going on after one of my knee ORs"  . COPD (chronic obstructive pulmonary disease) ([place])   . DDD (degenerative disc disease), lumbar   . Depression   . 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 Dyspnea    DOE  . Dysrhythmia    AFIB  . Edema    FEET/ANKLES  . GERD (gastroesophageal reflux disease)   . Hyperlipidemia   . Hypertension   . Hypothyroid   . PAD (peripheral artery disease) ([place])   . PVD (peripheral vascular disease) ([place])   . Sepsis ([place])    UTI/ECOLI I WEEK AGO   Active Problems:   Status post total knee replacement using cement, left  Estimated body mass index is 19.01 kg/m as calculated from the following:   Height as of this encounter: 5\' 8"  (1.727 m).   Weight as of this encounter: 56.7 kg. Advance diet Up with therapy D/C IV fluids Discharge to SNF Diet - Regular diet Follow up - in 2 weeks Activity - WBAT Disposition - Rehab Condition Upon Discharge - Stable DVT Prophylaxis -positive left lower extremity DVT.  Eliquis 10 mg twice daily for 7 days and 5 mg following twice daily  Reche Dixon, PA-C Orthopaedic Surgery 03/01/2018, 6:19 AM

## 2018-03-02 DIAGNOSIS — I82452 Acute embolism and thrombosis of left peroneal vein: Secondary | ICD-10-CM

## 2018-03-02 DIAGNOSIS — Z96652 Presence of left artificial knee joint: Secondary | ICD-10-CM

## 2018-03-02 DIAGNOSIS — I4819 Other persistent atrial fibrillation: Secondary | ICD-10-CM

## 2018-03-02 MED ORDER — AMIODARONE HCL 400 MG PO TABS: 400.0000 mg | ORAL_TABLET | Freq: Two times a day (BID) | ORAL | 0 refills | Status: DC

## 2018-03-02 MED ORDER — METOPROLOL TARTRATE 50 MG PO TABS: 50.0000 mg | ORAL_TABLET | Freq: Two times a day (BID) | ORAL | 0 refills | Status: DC

## 2018-03-02 MED ORDER — AMIODARONE HCL 200 MG PO TABS: 400.0000 mg | ORAL_TABLET | Freq: Two times a day (BID) | ORAL | Status: DC

## 2018-03-02 MED ORDER — AMIODARONE HCL 200 MG PO TABS: 200.0000 mg | ORAL_TABLET | Freq: Two times a day (BID) | ORAL | 0 refills | Status: DC

## 2018-03-02 MED ORDER — AMIODARONE HCL 200 MG PO TABS: 200.0000 mg | ORAL_TABLET | Freq: Once | ORAL | Status: DC

## 2018-03-02 NOTE — Progress Notes (Signed)
Subjective: 4 Days Post-Op Procedure(s) (LRB): TOTAL KNEE ARTHROPLASTY (Left) Patient reports pain as moderate.   Patient is being treated for a DVT in the left lower extremity.  She is presently on Eliquis instead of Lovenox.  She is improving with swelling.  The patient is having some confusion. PT and care management to assist with discharge planning. Negative for chest pain and shortness of breath Fever: no Gastrointestinal:Negative for nausea and vomiting  Objective: Vital signs in last 24 hours: Temp:  [97.9 F (36.6 C)-98.5 F (36.9 C)] 98.5 F (36.9 C) (11/16 0033) Pulse Rate:  [104-129] 104 (11/16 0033) Resp:  [16] 16 (11/16 0033) BP: (108-119)/(66-69) 108/69 (11/16 0033) SpO2:  [95 %-99 %] 97 % (11/16 0033)  Intake/Output from previous day: No intake or output data in the 24 hours ending 03/02/18 0657  Intake/Output this shift: No intake/output data recorded.  Labs: Recent Labs    03/01/18 0406  HGB 10.0*   Recent Labs    03/01/18 0406  WBC 11.0*  RBC 3.09*  HCT 31.8*  PLT 218   Recent Labs    02/28/18 0434 03/01/18 0406  NA 142 138  K 3.7 3.7  CL 107 104  CO2 27 27  BUN 23 25*  CREATININE 1.50* 1.53*  GLUCOSE 109* 102*  CALCIUM 9.0 8.9   No results for input(s): LABPT, INR in the last 72 hours.   EXAM General - Patient is Alert, Appropriate and Oriented Extremity -sensation intact. Ankle range of motion is intact.  Positive swelling involving the left lower extremity.  Negative Homans test. Dressing/Incision - clean, dry, no drainage with surgical bandage removed. Motor Function - intact, moving foot and toes well on exam.   Ambulated 8 feet.  Able to do a straight leg raise with mild assistance.   Past Medical History:  Diagnosis Date  . (HFpEF) heart failure with preserved ejection fraction ([place])    a. 12/2015 Echo: EF 60-65%. no rwma, Gr2 DD, triv AI, mild MR, mildly dil LA. Mild-mod TR. PASP 95mmHg.  Marland Kitchen Anemia   . Arthritis    "back,  hands" (09/08/2015)  . Cancer of right breast [facility]) 2007, 2013   right breast- 2007 radiation-mastectomy  . Chronic lower back pain   . CKD (chronic kidney disease), stage IV ([place])   . Complication of anesthesia    "took me about 1 week to know what was going on after one of my knee ORs"  . COPD (chronic obstructive pulmonary disease) ([place])   . DDD (degenerative disc disease), lumbar   . Depression   . 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 Edema    FEET/ANKLES  . GERD (gastroesophageal reflux disease)   . History of sepsis   . History of stress test    a. 08/2010: EF 73%, no ischemia/infarct.  . Hyperlipidemia   . Hypertension   . Hypothyroid   . PAD (peripheral artery disease) ([place])    a. 08/2015 s/p PTA of R AT w/ drug-coated balloon angioplasty to R Popliteal; b. 05/2017 ABI: R 1.16, L 0.88-->stable.  Marland Kitchen PAF (paroxysmal atrial fibrillation) ([place])    a. 08/2010 s/p DCCV;  b. CHA2DS2VASc = 6-->No OAC 2/2 h/o falls.   Assessment/Plan: 4 Days Post-Op Procedure(s) (LRB): TOTAL KNEE ARTHROPLASTY (Left) Active Problems:   Status post total knee replacement using cement, left  Estimated body mass index is 19.01 kg/m as calculated from the following:   Height as of this encounter: 5\' 8"  (1.727  m).   Weight as of this encounter: 56.7 kg. Advance diet Up with therapy D/C IV fluids when tolerating po intake.  Up with therapy today. Plan to discharge to rehab today, if atrial fibrillation is stable.  Patient may have to have cardioversion on Monday, if persistent elevated heart rate.  DVT Prophylaxis -Eliquis and Ted support hose. Weight-Bearing as tolerated to left leg  Reche Dixon, PA-C [facility] Orthopaedic Surgery 03/02/2018, 6:57 AM

## 2018-03-02 NOTE — Progress Notes (Signed)
This nurse received call from [place] at [facility]. He received a hard copy of a Rx for enoxaparin but it was not on med list from doc and he wanted clarification.  This nurse told him that it was not on his MAR and she was taking the DVT dosed apixaban. He had no further questions.

## 2018-03-02 NOTE — Progress Notes (Signed)
Progress Note  Patient Name: [name] Date of Encounter: 03/02/2018  Primary Cardiologist: Fletcher Anon  Subjective   Remains in Afib with ventricular rates in the 80s to 120s bpm. Tolerating Eliquis DVT dosing. No pain in the left leg. No chest pain or SOB. Planning to be discharged to [facility] this morning. Confusion improving.   Inpatient Medications    Scheduled Meds: . amiodarone  200 mg Oral Once  . amiodarone  400 mg Oral BID  . apixaban  10 mg Oral BID  . calcium-vitamin D  1 tablet Oral Q breakfast  . cholecalciferol  5,000 Units Oral Daily  . docusate sodium  100 mg Oral BID  . famotidine  10 mg Oral Daily  . ferrous sulfate  325 mg Oral Q breakfast  . Influenza vac split quadrivalent PF  0.5 mL Intramuscular Tomorrow-1000  . lactose free nutrition  237 mL Oral Daily  . levothyroxine  150 mcg Oral QAC breakfast  . metoprolol tartrate  50 mg Oral BID  . multivitamin with minerals  1 tablet Oral Daily  . pantoprazole  40 mg Oral Daily  . potassium chloride  10 mEq Oral BID  . rosuvastatin  10 mg Oral Daily  . spironolactone  25 mg Oral Daily  . torsemide  20 mg Oral Daily  . vitamin C  250 mg Oral Daily   Continuous Infusions: . sodium chloride 50 mL/hr at 02/27/18 1140   PRN Meds: acetaminophen, bisacodyl, diphenhydrAMINE, docusate sodium, HYDROmorphone (DILAUDID) injection, magnesium hydroxide, metoCLOPramide **OR** metoCLOPramide (REGLAN) injection, ondansetron **OR** ondansetron (ZOFRAN) IV, oxyCODONE, sodium phosphate, traMADol   Vital Signs    Vitals:   03/01/18 1147 03/01/18 1702 03/02/18 0033 03/02/18 0822  BP: 119/66 119/67 108/69 125/74  Pulse: (!) 129 (!) 119 (!) 104 96  Resp:   16   Temp:  98.5 F (36.9 C) 98.5 F (36.9 C) 98.2 F (36.8 C)  TempSrc:  Oral Oral Oral  SpO2: 95% 99% 97% 93%  Weight:      Height:       No intake or output data in the 24 hours ending 03/02/18 1107 Filed Weights   02/26/18 0629  Weight: 56.7 kg     Telemetry    Afib 80s to 120s bpm - Personally Reviewed  ECG    n/a - Personally Reviewed  Physical Exam   GEN: No acute distress.   Neck: No JVD. Cardiac: Mildly tachycardic, irregularly irregular, no murmurs, rubs, or gallops.  Respiratory: Clear to auscultation bilaterally.  GI: Soft, nontender, non-distended.   MS: No edema; No deformity. Neuro:  Alert and oriented x 3; Nonfocal.  Psych: Normal affect.  Labs    Chemistry Recent Labs  Lab 02/27/18 0334 02/28/18 0434 03/01/18 0406  NA 140 142 138  K 3.9 3.7 3.7  CL 106 107 104  CO2 25 27 27   GLUCOSE 126* 109* 102*  BUN 31* 23 25*  CREATININE 2.07* 1.50* 1.53*  CALCIUM 8.7* 9.0 8.9  GFRNONAA 21* 31* 30*  GFRAA 24* 36* 35*  ANIONGAP 9 8 7      Hematology Recent Labs  Lab 02/27/18 0334 03/01/18 0406  WBC 7.6 11.0*  RBC 2.99* 3.09*  HGB 9.7* 10.0*  HCT 30.3* 31.8*  MCV 101.3* 102.9*  MCH 32.4 32.4  MCHC 32.0 31.4  RDW 13.2 13.4  PLT 184 218    Cardiac Enzymes Recent Labs  Lab 03/01/18 1324  TROPONINI <0.03   No results for input(s): TROPIPOC in the  last 168 hours.   BNPNo results for input(s): BNP, PROBNP in the last 168 hours.   DDimer No results for input(s): DDIMER in the last 168 hours.   Radiology    US Venous Img Lower Unilateral Left  Result Date: 02/28/2018 IMPRESSION: The study is positive for DVT in the left calf within the left peroneal vein. No evidence of femoral or popliteal vein DVT in the left lower extremity. Electronically Signed   By: Marybelle Killings M.D.   On: 02/28/2018 12:08    Cardiac Studies   Echo 03/01/2018: Study Conclusions  - Left ventricle: The cavity size was normal. There was mild   concentric hypertrophy. Systolic function was normal. The   estimated ejection fraction was in the range of 55% to 60%. Wall   motion was normal; there were no regional wall motion   abnormalities. The study is not technically sufficient to allow   evaluation of LV  diastolic function. - Mitral valve: Calcified annulus. There was mild regurgitation. - Left atrium: The atrium was mildly dilated. - Pulmonary arteries: Systolic pressure was within the normal   range.  Patient Profile     84 y.o. female with history of PAD, PAF, HFpEF, CKD stage IV, HTN, HLD, COPD, DDD, depression, anemia of chronic disease, falls, and right-sided breast cancer who underwent planned left knee replacement on 11/12 and developed a left lower extremity DVT on 11/14 and was found to be in Afib with RVR on ~ 11/13 or 11/14.   Assessment & Plan    1. PAF: -Remains in Afib with ventricular rates in the 80s to 120s bpm, mostly in the low 100s bpm -It appears she developed Afib either on the evening of 11/13 or the morning of 11/14 based on her heart rates from admission until 11 AM on 11/14 running in the 60s to 70s bpm. Then at ~ 11 AM on 11/14 heart rates became tachycardic in the low 100s to 120s bpm -Increase amiodarone to 400 mg bid -Continue Lopressor 50 mg bid (relative hypotension precludes further titration at this time) -Continue DVT dosed Eliquis -Asymptomatic  -If she remains in Afib in follow up, we can consider DCCV as an outpatient   2. HFpEF: -She does not appear grossly volume up on exam today -Echo this admission as above -She is at risk for increased diastolic failure if she becomes significantly tachycardic and remains tachycardic for sustained amount of time -Remains on torsemide  3. Left lower extremity DVT: -No pain -DVT dosed Eliquis at 10 mg bid x 7 days then 5 mg bid thereafter  -However, she does qualify for reduced dose Eliquis with regards to her A. fib so once her DVT has been adequately treated with DVT dosed Eliquis we will need to taper her Eliquis to 2.5 mg twice daily -PCP will need to manage Eliquis for DVT  4. CKD stage III-IV: -Stable  5. HLD: -LDL of 97 in 10/2017 -Continue statin  6. PAD: -Recent ABIs earlier in 2019 were  stable -No claudication -Aspirin has been discontinued with the initiation of Eliquis as above -Continue statin   For questions or updates, please contact [place] Please consult [email or web address] for contact info under Cardiology/STEMI.    Signed, Christell Faith, PA-C [place] Pager: [phone] 03/02/2018, 11:07 AM

## 2018-03-02 NOTE — Plan of Care (Signed)
  Problem: Activity: Goal: Risk for activity intolerance will decrease Outcome: Progressing   Problem: Nutrition: Goal: Adequate nutrition will be maintained Outcome: Progressing   Problem: Coping: Goal: Level of anxiety will decrease Outcome: Progressing   Problem: Pain Managment: Goal: General experience of comfort will improve Outcome: Progressing   Problem: Skin Integrity: Goal: Risk for impaired skin integrity will decrease Outcome: Progressing   

## 2018-03-02 NOTE — Progress Notes (Signed)
[facility] [place] NAME: Karigan Cloninger    MR#:  [id number]  DATE OF BIRTH:  July 23, 1932  SUBJECTIVE:  CHIEF COMPLAINT: Patient is pleasantly confused from the pain medications.  Husband at bedside.  Denies any palpitations  REVIEW OF SYSTEMS:  Limited  cONSTITUTIONAL: No fever, fatigue or weakness.  RESPIRATORY: No cough, shortness of breath, wheezing or hemoptysis.  CARDIOVASCULAR: No chest pain, orthopnea, edema.  GASTROINTESTINAL: No nausea, vomiting, diarrhea or abdominal pain.  MUSCULOSKELETAL: No joint pain or arthritis.   NEUROLOGIC: No tingling, numbness, weakness.    DRUG ALLERGIES:   Allergies  Allergen Reactions  . Other Other (See Comments)    NO BP, VENIPUNCTURE, OR ACCESS IN RIGHT ARM - S/P MASTECTOMY ON RIGHT  . Amlodipine Nausea And Vomiting  . Ciprofloxacin Nausea Only  . Doxycycline Itching and Other (See Comments)    shaky  . Lipitor [Atorvastatin] Other (See Comments)    Joint pain   . Morphine And Related Nausea And Vomiting  . Macrobid [Nitrofurantoin Macrocrystal] Rash  . Penicillins Rash and Other (See Comments)    Has patient had a PCN reaction causing immediate rash, facial/tongue/throat swelling, SOB or lightheadedness with hypotension: yes Has patient had a PCN reaction causing severe rash involving mucus membranes or skin necrosis: no Has patient had a PCN reaction that required hospitalization no Has patient had a PCN reaction occurring within the last 10 years: no If all of the above answers are "NO", then may proceed with Cephalosporin use.   . Sulfa Antibiotics Rash    VITALS:  Blood pressure 125/74, pulse 96, temperature 98.2 F (36.8 C), temperature source Oral, resp. rate 16, height 5\' 8"  (1.727 m), weight 56.7 kg, SpO2 93 %.  PHYSICAL EXAMINATION:  GENERAL:  84 y.o.-year-old patient lying in the bed with no acute distress.  EYES: Pupils equal, round, reactive to light and  accommodation. No scleral icterus. Extraocular muscles intact.  HEENT: Head atraumatic, normocephalic. Oropharynx and nasopharynx clear.  NECK:  Supple, no jugular venous distention. No thyroid enlargement, no tenderness.  LUNGS: Normal breath sounds bilaterally, no wheezing, rales,rhonchi or crepitation. No use of accessory muscles of respiration.  CARDIOVASCULAR: Irregularly regular no murmurs, rubs, or gallops.  ABDOMEN: Soft, nontender, nondistended. Bowel sounds present. No organomegaly or mass.  EXTREMITIES: Left knee intact and dressing NEUROLOGIC: Awake and alert but altered PSYCHIATRIC: The patient is alert and oriented x1    LABORATORY PANEL:   CBC Recent Labs  Lab 03/01/18 0406  WBC 11.0*  HGB 10.0*  HCT 31.8*  PLT 218   ------------------------------------------------------------------------------------------------------------------  Chemistries  Recent Labs  Lab 03/01/18 0406  NA 138  K 3.7  CL 104  CO2 27  GLUCOSE 102*  BUN 25*  CREATININE 1.53*  CALCIUM 8.9   ------------------------------------------------------------------------------------------------------------------  Cardiac Enzymes Recent Labs  Lab 03/01/18 1324  TROPONINI <0.03   ------------------------------------------------------------------------------------------------------------------  RADIOLOGY:  No results found.  EKG:   Orders placed or performed during the hospital encounter of 02/26/18  . EKG 12-Lead  . EKG 12-Lead  . EKG 12-Lead  . EKG 12-Lead    ASSESSMENT AND PLAN:    A. fib with RVR. Clinically improved and rate controlled Seen by cardiology and amiodarone dose increased to 400 mg p.o. twice daily for a week followed by 200 mg twice a day Appreciate cardiology recommendations Continue Lopressor twice daily, continue Eliquis. Okay to discharge patient from cardiology and internal medicine standpoint  Echocardiograph-ejection fraction 55 to  60%, cavity size was  normal, mild concentric hypertrophy.  Wall motion was normal.  No regional wall motion abnormalities  DVT in the left calf within the left peroneal vein.  Continue Eliquis.  Hypertension.  Controlled continue home hypertension medication.  Hyperlipidemia.  Continue aspirin and Crestor.  Anemia of chronic disease.  Hemoglobin is stable near baseline.    Hemoglobin at 10.0  CKD.  Stage III.  Stable.  COPD.  Stable.  Will sign off  All the records are reviewed and case discussed with Care Management/Social Workerr. Management plans discussed with the patient, family and they are in agreement.  CODE STATUS: DNR  TOTAL TIME TAKING CARE OF THIS PATIENT: 33  minutes.  Discharge to skilled nursing facility by the attending physician will sign off  Note: This dictation was prepared with Dragon dictation along with smaller phrase technology. Any transcriptional errors that result from this process are unintentional.   Nicholes Mango M.D on 03/02/2018 at 12:41 PM  Between 7am to 6pm - Pager - [phone] After 6pm go to [email or web address] - password [email or web address] [facility]  Office  [phone]  CC: Primary care physician; Rusty Aus, MD

## 2018-03-02 NOTE — Progress Notes (Addendum)
Physical Therapy Treatment Patient Details Name: [name] MRN: [id number] DOB: 1932-09-07 Today's Date: 03/02/2018    History of Present Illness Pt is 84 yo female s/p L TKA, WBAT. PMH of COPD, SOB, HTN, CHF, CKD, hypothyroidism, afib, breast cx.    PT Comments    Pt in bed, requesting to use bathroom.  To edge of bed with min a x 1.  Stood with mod a x 1 and took several very hesitant steps with walker and mod a x 1.  Gait generally unsteady and unsafe to walk further so she was assisted back to bed.  Commode was obtained and she transferred with mod a x 1 with max verbal cues for hand placements and technique.  Large BM and bathing with assist of nurse tech.  She was able to stand for care with min a x 1 for balance before transferring back to bed with min a x 1.  Participated in exercises as described below.  HR monitored and remained in 100's during session.   Follow Up Recommendations  SNF     Equipment Recommendations       Recommendations for Other Services       Precautions / Restrictions Precautions Precautions: Knee;Fall Restrictions Weight Bearing Restrictions: Yes LLE Weight Bearing: Weight bearing as tolerated Other Position/Activity Restrictions: monitor HR    Mobility  Bed Mobility Overal bed mobility: Needs Assistance Bed Mobility: Sit to Supine;Supine to Sit     Supine to sit: Min assist Sit to supine: Min assist      Transfers Overall transfer level: Needs assistance Equipment used: Rolling walker (2 wheeled) Transfers: Sit to/from Stand Sit to Stand: Mod assist         General transfer comment: verbal cues for hand placements and safety  Ambulation/Gait Ambulation/Gait assistance: Mod assist Gait Distance (Feet): 2 Feet Assistive device: Rolling walker (2 wheeled) Gait Pattern/deviations: Step-to pattern;Decreased step length - right;Decreased step length - left;Decreased stance time - left Gait velocity: decreased   General Gait  Details: Generally poor gait quality and safety.  Limited by [employer] investment [employer] (Stroke Patients Only)       Balance Overall balance assessment: Needs assistance Sitting-balance support: Feet supported;Bilateral upper extremity supported Sitting balance-[name]: Fair     Standing balance support: Bilateral upper extremity supported Standing balance-[name]: Poor                              Cognition Arousal/Alertness: Awake/alert Behavior During Therapy: WFL for tasks assessed/performed                                   General Comments: Family reports confusion      Exercises Total Joint Exercises Ankle Circles/Pumps: AROM;Strengthening;Both;10 reps;Supine Quad Sets: AROM;Strengthening;Both;10 reps;Supine Short Arc Quad: AAROM;Strengthening;Left;10 reps;Supine Heel Slides: AAROM;Strengthening;Left;10 reps;Supine Straight Leg Raises: AAROM;Strengthening;Left;10 reps;Supine Goniometric ROM: 4- unable to get a good flexion measurement in supine as pt was resisting flexion Marching in Standing: AROM;Both;5 reps;Standing Other Exercises Other Exercises: to commode for bathing and BM with nurse tech    General Comments        Pertinent Vitals/Pain Pain Assessment: Faces Faces Pain Scale: Hurts little more Pain Location:  L knee with flexion Pain Descriptors / Indicators: Sore;Tender;Grimacing Pain  Intervention(s): Limited activity within patient's tolerance;Monitored during session;Ice applied    Home Living                      Prior Function            PT Goals (current goals can now be found in the care plan section) Progress towards PT goals: Progressing toward goals    Frequency    BID      PT Plan Current plan remains appropriate    Co-evaluation              AM-PAC PT "6 Clicks" Daily Activity  Outcome Measure  Difficulty turning over  in bed (including adjusting bedclothes, sheets and blankets)?: A Little Difficulty moving from lying on back to sitting on the side of the bed? : A Little Difficulty sitting down on and standing up from a chair with arms (e.g., wheelchair, bedside commode, etc,.)?: Unable Help needed moving to and from a bed to chair (including a wheelchair)?: A Lot Help needed walking in hospital room?: Total Help needed climbing 3-5 steps with a railing? : Total 6 Click Score: 11    End of Session Equipment Utilized During Treatment: Gait belt Activity Tolerance: Patient tolerated treatment well Patient left: in bed;with call bell/phone within reach;with bed alarm set;with family/visitor present   Pain - Right/Left: Left Pain - part of body: Knee     Time: 0223-3612 PT Time Calculation (min) (ACUTE ONLY): 23 min  Charges:  $Gait Training: 8-22 mins $Therapeutic Exercise: 8-22 mins                     Chesley Noon, PTA 03/02/18, 10:53 AM

## 2018-03-02 NOTE — Progress Notes (Signed)
Patient is being discharged to [facility]. Report called to [facility]. NT prepared patient for transport via EMS. IV removed, AVS printed. VSS at time of discharge.

## 2018-03-02 NOTE — Clinical Social Work Note (Signed)
The patient will discharge today via non-emergent EMS to [facility]. The patient, her daughter, and the facility are aware and in agreement. The CSW has sent all updated discharge documentation and has delivered the discharge packet including hard scripts and the DNR. The CSW is signing off. Please consult should needs arise.  Santiago Bumpers, MSW, Latanya Presser [phone]

## 2018-03-04 ENCOUNTER — Non-Acute Institutional Stay (SKILLED_NURSING_FACILITY): Payer: Medicare HMO | Admitting: Adult Health

## 2018-03-04 ENCOUNTER — Other Ambulatory Visit: Payer: Self-pay | Admitting: Adult Health

## 2018-03-04 ENCOUNTER — Encounter: Payer: Self-pay | Admitting: Adult Health

## 2018-03-04 DIAGNOSIS — D631 Anemia in chronic kidney disease: Secondary | ICD-10-CM

## 2018-03-04 DIAGNOSIS — I739 Peripheral vascular disease, unspecified: Secondary | ICD-10-CM | POA: Diagnosis not present

## 2018-03-04 DIAGNOSIS — N183 Chronic kidney disease, stage 3 unspecified: Secondary | ICD-10-CM

## 2018-03-04 DIAGNOSIS — E785 Hyperlipidemia, unspecified: Secondary | ICD-10-CM

## 2018-03-04 DIAGNOSIS — I5032 Chronic diastolic (congestive) heart failure: Secondary | ICD-10-CM

## 2018-03-04 DIAGNOSIS — I480 Paroxysmal atrial fibrillation: Secondary | ICD-10-CM

## 2018-03-04 DIAGNOSIS — E034 Atrophy of thyroid (acquired): Secondary | ICD-10-CM

## 2018-03-04 DIAGNOSIS — L97909 Non-pressure chronic ulcer of unspecified part of unspecified lower leg with unspecified severity: Secondary | ICD-10-CM | POA: Diagnosis not present

## 2018-03-04 DIAGNOSIS — Z96652 Presence of left artificial knee joint: Secondary | ICD-10-CM

## 2018-03-04 DIAGNOSIS — I82452 Acute embolism and thrombosis of left peroneal vein: Secondary | ICD-10-CM

## 2018-03-04 DIAGNOSIS — I130 Hypertensive heart and chronic kidney disease with heart failure and stage 1 through stage 4 chronic kidney disease, or unspecified chronic kidney disease: Secondary | ICD-10-CM

## 2018-03-04 DIAGNOSIS — K219 Gastro-esophageal reflux disease without esophagitis: Secondary | ICD-10-CM

## 2018-03-04 DIAGNOSIS — M1712 Unilateral primary osteoarthritis, left knee: Secondary | ICD-10-CM

## 2018-03-04 MED ORDER — OXYCODONE HCL 5 MG PO TABS: 5.0000 mg | ORAL_TABLET | ORAL | 0 refills | Status: DC | PRN

## 2018-03-04 MED ORDER — TRAMADOL HCL 50 MG PO TABS: 50.0000 mg | ORAL_TABLET | Freq: Four times a day (QID) | ORAL | 0 refills | Status: DC | PRN

## 2018-03-04 NOTE — Progress Notes (Signed)
Location:   [facility] Room Number: [place] of Service:  SNF (31)   CODE STATUS: Full Code  Allergies  Allergen Reactions  . Other Other (See Comments)    NO BP, VENIPUNCTURE, OR ACCESS IN RIGHT ARM - S/P MASTECTOMY ON RIGHT  . Amlodipine Nausea And Vomiting  . Ciprofloxacin Nausea Only  . Doxycycline Itching and Other (See Comments)    shaky  . Lipitor [Atorvastatin] Other (See Comments)    Joint pain   . Morphine And Related Nausea And Vomiting  . Macrobid [Nitrofurantoin Macrocrystal] Rash  . Penicillins Rash and Other (See Comments)    Has patient had a PCN reaction causing immediate rash, facial/tongue/throat swelling, SOB or lightheadedness with hypotension: yes Has patient had a PCN reaction causing severe rash involving mucus membranes or skin necrosis: no Has patient had a PCN reaction that required hospitalization no Has patient had a PCN reaction occurring within the last 10 years: no If all of the above answers are "NO", then may proceed with Cephalosporin use.   . Sulfa Antibiotics Rash    Chief Complaint  Patient presents with  . Hospitalization Follow-up    Hospital Follow up    HPI:  She is a 84 year old woman who has been hospitalized for a left knee replacement. She is here for short term rehab with her goal to return back home. She does have ulcerations on her left lower extremity due to PAD. She denies any uncontrolled pain in her left leg; no changes in appetite; no insomnia or anxiety. There are no reports of fevers present. She will continue to be followed for her chronic illnesses including: PAF: dvt; pad.   Past Medical History:  Diagnosis Date  . (HFpEF) heart failure with preserved ejection fraction ([place])    a. 12/2015 Echo: EF 60-65%. no rwma, Gr2 DD, triv AI, mild MR, mildly dil LA. Mild-mod TR. PASP 51mmHg.  Marland Kitchen Anemia   . Arthritis    "back, hands" (09/08/2015)  . Cancer of right breast [facility]) 2007, 2013   right breast- 2007 radiation-mastectomy  . Chronic lower back pain   . CKD (chronic kidney disease), stage IV ([place])   . Complication of anesthesia    "took me about 1 week to know what was going on after one of my knee ORs"  . COPD (chronic obstructive pulmonary disease) ([place])   . DDD (degenerative disc disease), lumbar   . Depression   . 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 Edema    FEET/ANKLES  . GERD (gastroesophageal reflux disease)   . History of sepsis   . History of stress test    a. 08/2010: EF 73%, no ischemia/infarct.  . Hyperlipidemia   . Hypertension   . Hypothyroid   . PAD (peripheral artery disease) ([place])    a. 08/2015 s/p PTA of R AT w/ drug-coated balloon angioplasty to R Popliteal; b. 05/2017 ABI: R 1.16, L 0.88-->stable.  Marland Kitchen PAF (paroxysmal atrial fibrillation) ([place])    a. 08/2010 s/p DCCV;  b. CHA2DS2VASc = 6-->No OAC 2/2 h/o falls.    Past Surgical History:  Procedure Laterality Date  . ABDOMINAL EXPLORATION SURGERY     "had to go back in after hysterectomy & check on stitch dr had put near my bladder; don't know if they took it out; had to wear catheter for 1 month"  . ABDOMINAL HYSTERECTOMY    . APPENDECTOMY    .  BACK SURGERY    . BALLOON ANGIOPLASTY, ARTERY Right 09/08/2015   superficial femoral  . BREAST EXCISIONAL BIOPSY Right 2007   positive  . BREAST SURGERY    . CARDIOVERSION  11/10/2011   Procedure: CARDIOVERSION;  Surgeon: Lelon Perla, MD;  Location: [place];  Service: Cardiovascular;  Laterality: N/A;  . CARDIOVERSION  08/26/2010  . CATARACT EXTRACTION W/PHACO Right 07/17/2017   Procedure: CATARACT EXTRACTION PHACO AND INTRAOCULAR LENS PLACEMENT (IOC);  Surgeon: Birder Robson, MD;  Location: [facility] ORS;  Service: Ophthalmology;  Laterality: Right;  [place] 00:44.0 AP% 14.6 CDE 6.44 FLUID PACK LOT # [id number]  . CATARACT EXTRACTION W/PHACO Left 08/07/2017   Procedure: CATARACT EXTRACTION PHACO AND INTRAOCULAR LENS PLACEMENT (IOC);   Surgeon: Birder Robson, MD;  Location: [facility] ORS;  Service: Ophthalmology;  Laterality: Left;  [place] 00:44 AP% 14.1 CDE 6.32 Fluid pack lot # [id number]  . COLONOSCOPY WITH PROPOFOL N/A 01/04/2016   Procedure: COLONOSCOPY WITH PROPOFOL;  Surgeon: Lollie Sails, MD;  Location: [facility];  Service: Endoscopy;  Laterality: N/A;  . HALLUX VALGUS CORRECTION    . JOINT REPLACEMENT    . LUMBAR DISC SURGERY    . MASTECTOMY Right 2013   positive  . PERIPHERAL VASCULAR CATHETERIZATION N/A 09/08/2015   Procedure: Abdominal Aortogram w/Lower Extremity;  Surgeon: Wellington Hampshire, MD;  Location: [place] CV LAB;  Service: Cardiovascular;  Laterality: N/A;  . PERIPHERAL VASCULAR CATHETERIZATION  09/08/2015   Procedure: Peripheral Vascular Balloon Angioplasty;  Surgeon: Wellington Hampshire, MD;  Location: [place] CV LAB;  Service: Cardiovascular;;  . REVISION TOTAL KNEE ARTHROPLASTY Right   . rt.ankle ulcer surgery  2013  . TEMPOROMANDIBULAR JOINT ARTHROPLASTY    . TMJ ARTHROPLASTY    . TONSILLECTOMY    . TOTAL KNEE ARTHROPLASTY Right   . TOTAL KNEE ARTHROPLASTY Left 02/26/2018   Procedure: TOTAL KNEE ARTHROPLASTY;  Surgeon: Corky Mull, MD;  Location: [facility] ORS;  Service: Orthopedics;  Laterality: Left;  Marland Kitchen VASCULAR SURGERY      Social History   Socioeconomic History  . Marital status: Married    Spouse name: Not on file  . Number of children: 2  . Years of education: Not on file  . Highest education level: Not on file  Occupational History  . Not on file  Social Needs  . Financial resource strain: Not on file  . Food insecurity:    Worry: Not on file    Inability: Not on file  . Transportation needs:    Medical: Not on file    Non-medical: Not on file  Tobacco Use  . Smoking status: Never Smoker  . Smokeless tobacco: Never Used  Substance and Sexual Activity  . Alcohol use: No  . Drug use: No  . Sexual activity: Never    Birth control/protection: Post-menopausal    Lifestyle  . Physical activity:    Days per week: Not on file    Minutes per session: Not on file  . Stress: Not on file  Relationships  . Social connections:    Talks on phone: Not on file    Gets together: Not on file    Attends religious service: Not on file    Active member of club or organization: Not on file    Attends meetings of clubs or organizations: Not on file    Relationship status: Not on file  . Intimate partner violence:    Fear of current or ex partner: Not on file  Emotionally abused: Not on file    Physically abused: Not on file    Forced sexual activity: Not on file  Other Topics Concern  . Not on file  Social History Narrative  . Not on file   Family History  Problem Relation Age of Onset  . Heart attack Father   . Coronary artery disease Sister 62       MI  . Breast cancer Maternal Aunt       VITAL SIGNS BP 105/60   Pulse 93   Temp 98.1 F (36.7 C)   Resp 18   Ht 5\' 8"  (1.727 m)   Wt 128 lb 8 oz (58.3 kg)   SpO2 99%   BMI 19.54 kg/m   Outpatient Encounter Medications as of 03/04/2018  Medication Sig  . acetaminophen (TYLENOL) 325 MG tablet Take 650 mg by mouth every 4 (four) hours as needed.  Marland Kitchen acetaminophen (TYLENOL) 500 MG tablet Take 1,000 mg by mouth at bedtime as needed for moderate pain or headache.  Marland Kitchen amiodarone (PACERONE) 200 MG tablet Take 200 mg by mouth 2 (two) times daily.  Derrill Memo ON 03/08/2018] apixaban (ELIQUIS) 5 MG TABS tablet Take 1 tablet (5 mg total) by mouth 2 (two) times daily.  Marland Kitchen apixaban (ELIQUIS) 5 MG TABS tablet Take 2 tablets (10 mg total) by mouth 2 (two) times daily for 7 days.  . calcium-vitamin D (OSCAL WITH D) 500-200 MG-UNIT per tablet Take 1 tablet by mouth daily with breakfast.  . Cholecalciferol (VITAMIN D3) 5000 units CAPS Take 5,000 Units by mouth daily.   Marland Kitchen docusate sodium (COLACE) 100 MG capsule Take 100 mg by mouth daily as needed for mild constipation.  . ferrous sulfate 325 (65 FE) MG tablet  Take 325 mg by mouth daily with breakfast.  . lactose free nutrition (BOOST) LIQD Take 237 mLs by mouth daily.  Marland Kitchen levothyroxine (SYNTHROID, LEVOTHROID) 150 MCG tablet Take 150 mcg by mouth daily before breakfast.  . lisinopril (PRINIVIL,ZESTRIL) 10 MG tablet Take 10 mg by mouth daily.  . Multiple Vitamins-Minerals (CENTROVITE) TABS Take 1 tablet by mouth daily.  . NON FORMULARY Diet type: NAS, Regular consistency, thin liquids ok  . ondansetron (ZOFRAN) 4 MG tablet Take 1 tablet (4 mg total) by mouth every 6 (six) hours as needed for nausea.  Marland Kitchen oxyCODONE (OXY IR/ROXICODONE) 5 MG immediate release tablet Take 1-2 tablets (5-10 mg total) by mouth every 4 (four) hours as needed for moderate pain.  . pantoprazole (PROTONIX) 40 MG tablet Take 1 tablet (40 mg total) by mouth daily.  . potassium chloride (K-DUR) 10 MEQ tablet Take 10 mEq by mouth 2 (two) times daily.   . ranitidine (ZANTAC) 300 MG tablet Take 300 mg by mouth at bedtime.  . rosuvastatin (CRESTOR) 10 MG tablet Take 1 tablet (10 mg total) by mouth daily.  Marland Kitchen spironolactone (ALDACTONE) 25 MG tablet Take 25 mg by mouth daily.  Marland Kitchen torsemide (DEMADEX) 20 MG tablet Take 20 mg by mouth daily.   . traMADol (ULTRAM) 50 MG tablet Take 1 tablet (50 mg total) by mouth every 6 (six) hours as needed for moderate pain.  . vitamin C (ASCORBIC ACID) 250 MG tablet Take 250 mg by mouth daily.  Derrill Memo ON 03/05/2018] Wound Dressings (HYDROGEL EX) Starting 59/56/38 - Apply 1 application to left lower leg / dorsal foot wound base then cover with calcium alginate and Allevyn patch daily.  May reuse Allevyn patch if not soiled and change every 3-5  days   No facility-administered encounter medications on file as of 03/04/2018.      SIGNIFICANT DIAGNOSTIC EXAMS  TODAY: '  02-28-18: left lower extremity venous doppler: The study is positive for DVT in the left calf within the left peroneal vein. No evidence of femoral or popliteal vein DVT in the left lower  extremity  03-01-18: 2-d echo:  Left ventricle: The cavity size was normal. There was mild concentric hypertrophy. Systolic function was normal. The estimated ejection fraction was in the range of 55% to 60%. Wall motion was normal; there were no regional wall motion abnormalities. The study is not technically sufficient to allow evaluation of LV diastolic function. - Mitral valve: Calcified annulus. There was mild regurgitation. - Left atrium: The atrium was mildly dilated. - Pulmonary arteries: Systolic pressure was within the normal range.   LABS REVIEWED TODAY:   02-27-18: wbc 7.6; hgb 9.7; hc6 30.3; mcv 101.3; plt 184; glucose 126; bun 31; creat 2.07; k+ 3.9; na++ 140; ca 8.7 03-01-18: wbc 11.0; hgb 10.0; hct 31.8; mcv 102.9; plt 218; glucose 102; bun 25; creat 1.53; k+ 3.7; na++ 138; ca 8.9    Review of Systems  Constitutional: Negative for malaise/fatigue.  Respiratory: Negative for cough and shortness of breath.   Cardiovascular: Negative for chest pain, palpitations and leg swelling.  Gastrointestinal: Negative for abdominal pain, constipation and heartburn.  Musculoskeletal: Positive for joint pain. Negative for back pain and myalgias.       Left knee pain is managed   Skin:       Has 2 sores on left lower leg and foot   Neurological: Negative for dizziness.  Psychiatric/Behavioral: The patient is not nervous/anxious.     Physical Exam  Constitutional: She is oriented to person, place, and time. She appears well-developed and well-nourished. No distress.  Thin   Neck: No thyromegaly present.  Cardiovascular: Normal rate, regular rhythm and normal heart sounds.  Pedal pulse on left via doppler On right very faint   Pulmonary/Chest: Effort normal and breath sounds normal. No respiratory distress.  Abdominal: Soft. Bowel sounds are normal. She exhibits no distension. There is no tenderness.  Musculoskeletal: She exhibits edema.  1+ left lower extremity edema Is able to  move all extremities Is status post left total knee replacement   Lymphadenopathy:    She has no cervical adenopathy.  Neurological: She is alert and oriented to person, place, and time.  Skin: Skin is warm and dry. She is not diaphoretic.  Bilateral lower extremities discolored due to poor circulation Left anterior foot: arterial wound: without signs of infection present Left lower anterior leg ulceration without signs of infection  Left heel with DTI Right heel soft   Psychiatric: She has a normal mood and affect.    ASSESSMENT/ PLAN:  TODAY:   1.  PAF (paroxysmal atrial fibrillation) heart rate is stable will continue amiodarone 200 mg twice daily   2. Chronic diastolic congestive heart failure: EF 55-60% is stable will continue demadex 20 mg daily with k+ 10 meq twice daily and aldactone 25 mg daily   3. Acute deep vein thrombosis (DVT) (02-28-18): is stable will continue eliquis 10 mg twice daily through 03-08-18 then 5 mg twice daily  4. PAD (peripheral artery disease) has arterial left ulcer on left leg: is without change in status will continue wound care as indicated  5. Hypertensive heart and kidney disease with chronic diastolic congestive heart failure and stage 3 chronic kidney disease: is stable b/p  105/60: will continue lisionpril 10 mg daily   6. GERD without esophagitis: is stable will continue protonix 40 mg twice daily with zantac 300 mg nightly   7. CKD (chronic kidney disease) stage 3. GFR 30-59 ml/min: is without change: bun 25; creat 1.53  8. Anemia associated with stage 3 chronic kidney disease: is stable hgb 10.0; will continue iron daily  9.  Hypothyroidism due to acquired atrophy of thyroid: is stable will continue synthroid 150 mcg daily   10.  Dyslipidemia: is stable will continue crestor 10 mg daily   11. Ductal carcinoma in situ (DCIS) of right breast: is status post right mastectomy 2013.   12. Primary osteoarthritis of left knee is status post  left knee replacement: is stable will continue therapy as directed; will follow up with orthopedics as indicated and will continue oxycodone 5 or 10 mg every 4 hours as needed for pain.        MD is aware of resident's narcotic use and is in agreement with current plan of care. We will attempt to wean resident as apropriate   Ok Edwards NP [facility]  Contact [phone] Monday through Friday 8am- 5pm  After hours call [phone]

## 2018-03-06 DIAGNOSIS — I130 Hypertensive heart and chronic kidney disease with heart failure and stage 1 through stage 4 chronic kidney disease, or unspecified chronic kidney disease: Secondary | ICD-10-CM

## 2018-03-06 DIAGNOSIS — N183 Chronic kidney disease, stage 3 unspecified: Secondary | ICD-10-CM | POA: Insufficient documentation

## 2018-03-06 DIAGNOSIS — E785 Hyperlipidemia, unspecified: Secondary | ICD-10-CM | POA: Insufficient documentation

## 2018-03-06 DIAGNOSIS — L97909 Non-pressure chronic ulcer of unspecified part of unspecified lower leg with unspecified severity: Secondary | ICD-10-CM | POA: Insufficient documentation

## 2018-03-06 DIAGNOSIS — E034 Atrophy of thyroid (acquired): Secondary | ICD-10-CM | POA: Insufficient documentation

## 2018-03-06 DIAGNOSIS — D649 Anemia, unspecified: Secondary | ICD-10-CM | POA: Insufficient documentation

## 2018-03-06 DIAGNOSIS — M1712 Unilateral primary osteoarthritis, left knee: Secondary | ICD-10-CM | POA: Insufficient documentation

## 2018-03-06 DIAGNOSIS — D631 Anemia in chronic kidney disease: Secondary | ICD-10-CM | POA: Insufficient documentation

## 2018-03-06 DIAGNOSIS — K219 Gastro-esophageal reflux disease without esophagitis: Secondary | ICD-10-CM | POA: Insufficient documentation

## 2018-03-06 DIAGNOSIS — I5032 Chronic diastolic (congestive) heart failure: Secondary | ICD-10-CM

## 2018-03-06 DIAGNOSIS — N1832 Chronic kidney disease, stage 3b: Secondary | ICD-10-CM | POA: Insufficient documentation

## 2018-03-06 DIAGNOSIS — I82452 Acute embolism and thrombosis of left peroneal vein: Secondary | ICD-10-CM | POA: Insufficient documentation

## 2018-03-07 ENCOUNTER — Non-Acute Institutional Stay (SKILLED_NURSING_FACILITY): Payer: Medicare HMO | Admitting: Adult Health

## 2018-03-07 ENCOUNTER — Encounter: Payer: Self-pay | Admitting: Adult Health

## 2018-03-07 DIAGNOSIS — Z96652 Presence of left artificial knee joint: Secondary | ICD-10-CM

## 2018-03-07 DIAGNOSIS — I82452 Acute embolism and thrombosis of left peroneal vein: Secondary | ICD-10-CM | POA: Diagnosis not present

## 2018-03-07 DIAGNOSIS — M1712 Unilateral primary osteoarthritis, left knee: Secondary | ICD-10-CM

## 2018-03-07 DIAGNOSIS — I5032 Chronic diastolic (congestive) heart failure: Secondary | ICD-10-CM

## 2018-03-07 NOTE — Progress Notes (Signed)
Location:   [facility] Room Number: [place] of Service:  SNF (31)   CODE STATUS: DNR  Allergies  Allergen Reactions  . Other Other (See Comments)    NO BP, VENIPUNCTURE, OR ACCESS IN RIGHT ARM - S/P MASTECTOMY ON RIGHT  . Amlodipine Nausea And Vomiting  . Ciprofloxacin Nausea Only  . Doxycycline Itching and Other (See Comments)    shaky  . Lipitor [Atorvastatin] Other (See Comments)    Joint pain   . Morphine And Related Nausea And Vomiting  . Macrobid [Nitrofurantoin Macrocrystal] Rash  . Penicillins Rash and Other (See Comments)    Has patient had a PCN reaction causing immediate rash, facial/tongue/throat swelling, SOB or lightheadedness with hypotension: yes Has patient had a PCN reaction causing severe rash involving mucus membranes or skin necrosis: no Has patient had a PCN reaction that required hospitalization no Has patient had a PCN reaction occurring within the last 10 years: no If all of the above answers are "NO", then may proceed with Cephalosporin use.   . Sulfa Antibiotics Rash    Chief Complaint  Patient presents with  . Acute Visit    Care Plan Meeting    HPI:  We have come together for her routine care plan meeting. There goal of her care is to return back home. She and her spouse feel as though the oxycodone is too strong and makes her too lethargic. She is having pain but is having difficulty remembering to ask for pain medication. Staff would like her to have routine medication for pain. We have decided to try routine tylenol and to keep the prn ultram. She denies any changes in appetite. There are no reports of fevers present. She is ambulating 100 feet with standby assistance.  Past Medical History:  Diagnosis Date  . (HFpEF) heart failure with preserved ejection fraction ([place])    a. 12/2015 Echo: EF 60-65%. no rwma, Gr2 DD, triv AI, mild MR, mildly dil LA. Mild-mod TR. PASP 28mmHg.  Marland Kitchen Anemia   . Arthritis    "back,  hands" (09/08/2015)  . Cancer of right breast [facility]) 2007, 2013   right breast- 2007 radiation-mastectomy  . Chronic lower back pain   . CKD (chronic kidney disease), stage IV ([place])   . Complication of anesthesia    "took me about 1 week to know what was going on after one of my knee ORs"  . COPD (chronic obstructive pulmonary disease) ([place])   . DDD (degenerative disc disease), lumbar   . Depression   . 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 Edema    FEET/ANKLES  . GERD (gastroesophageal reflux disease)   . History of sepsis   . History of stress test    a. 08/2010: EF 73%, no ischemia/infarct.  . Hyperlipidemia   . Hypertension   . Hypothyroid   . PAD (peripheral artery disease) ([place])    a. 08/2015 s/p PTA of R AT w/ drug-coated balloon angioplasty to R Popliteal; b. 05/2017 ABI: R 1.16, L 0.88-->stable.  Marland Kitchen PAF (paroxysmal atrial fibrillation) ([place])    a. 08/2010 s/p DCCV;  b. CHA2DS2VASc = 6-->No OAC 2/2 h/o falls.    Past Surgical History:  Procedure Laterality Date  . ABDOMINAL EXPLORATION SURGERY     "had to go back in after hysterectomy & check on stitch dr had put near my bladder; don't know if they took it out; had to wear catheter for 1  month"  . ABDOMINAL HYSTERECTOMY    . APPENDECTOMY    . BACK SURGERY    . BALLOON ANGIOPLASTY, ARTERY Right 09/08/2015   superficial femoral  . BREAST EXCISIONAL BIOPSY Right 2007   positive  . BREAST SURGERY    . CARDIOVERSION  11/10/2011   Procedure: CARDIOVERSION;  Surgeon: Lelon Perla, MD;  Location: [place];  Service: Cardiovascular;  Laterality: N/A;  . CARDIOVERSION  08/26/2010  . CATARACT EXTRACTION W/PHACO Right 07/17/2017   Procedure: CATARACT EXTRACTION PHACO AND INTRAOCULAR LENS PLACEMENT (IOC);  Surgeon: Birder Robson, MD;  Location: [facility] ORS;  Service: Ophthalmology;  Laterality: Right;  [place] 00:44.0 AP% 14.6 CDE 6.44 FLUID PACK LOT # [id number]  . CATARACT EXTRACTION W/PHACO Left 08/07/2017    Procedure: CATARACT EXTRACTION PHACO AND INTRAOCULAR LENS PLACEMENT (IOC);  Surgeon: Birder Robson, MD;  Location: [facility] ORS;  Service: Ophthalmology;  Laterality: Left;  [place] 00:44 AP% 14.1 CDE 6.32 Fluid pack lot # [id number]  . COLONOSCOPY WITH PROPOFOL N/A 01/04/2016   Procedure: COLONOSCOPY WITH PROPOFOL;  Surgeon: Lollie Sails, MD;  Location: [facility] ENDOSCOPY;  Service: Endoscopy;  Laterality: N/A;  . HALLUX VALGUS CORRECTION    . JOINT REPLACEMENT    . LUMBAR DISC SURGERY    . MASTECTOMY Right 2013   positive  . PERIPHERAL VASCULAR CATHETERIZATION N/A 09/08/2015   Procedure: Abdominal Aortogram w/Lower Extremity;  Surgeon: Wellington Hampshire, MD;  Location: [place] CV LAB;  Service: Cardiovascular;  Laterality: N/A;  . PERIPHERAL VASCULAR CATHETERIZATION  09/08/2015   Procedure: Peripheral Vascular Balloon Angioplasty;  Surgeon: Wellington Hampshire, MD;  Location: [place] CV [facility];  Service: Cardiovascular;;  . REVISION TOTAL KNEE ARTHROPLASTY Right   . rt.ankle ulcer surgery  2013  . TEMPOROMANDIBULAR JOINT ARTHROPLASTY    . TMJ ARTHROPLASTY    . TONSILLECTOMY    . TOTAL KNEE ARTHROPLASTY Right   . TOTAL KNEE ARTHROPLASTY Left 02/26/2018   Procedure: TOTAL KNEE ARTHROPLASTY;  Surgeon: Corky Mull, MD;  Location: [facility] ORS;  Service: Orthopedics;  Laterality: Left;  Marland Kitchen VASCULAR SURGERY      Social History   Socioeconomic History  . Marital status: Married    Spouse name: Not on file  . Number of children: 2  . Years of education: Not on file  . Highest education level: Not on file  Occupational History  . Not on file  Social Needs  . Financial resource strain: Not on file  . Food insecurity:    Worry: Not on file    Inability: Not on file  . Transportation needs:    Medical: Not on file    Non-medical: Not on file  Tobacco Use  . Smoking status: Never Smoker  . Smokeless tobacco: Never Used  Substance and Sexual Activity  . Alcohol use: No  . Drug use: No  .  Sexual activity: Never    Birth control/protection: Post-menopausal  Lifestyle  . Physical activity:    Days per week: Not on file    Minutes per session: Not on file  . Stress: Not on file  Relationships  . Social connections:    Talks on phone: Not on file    Gets together: Not on file    Attends religious service: Not on file    Active member of club or organization: Not on file    Attends meetings of clubs or organizations: Not on file    Relationship status: Not on file  . Intimate partner violence:  Fear of current or ex partner: Not on file    Emotionally abused: Not on file    Physically abused: Not on file    Forced sexual activity: Not on file  Other Topics Concern  . Not on file  Social History Narrative  . Not on file   Family History  Problem Relation Age of Onset  . Heart attack Father   . Coronary artery disease Sister 83       MI  . Breast cancer Maternal Aunt       VITAL SIGNS BP 112/86   Pulse (!) 124   Temp 98.6 F (37 C)   Resp (!) 22   Ht 5\' 8"  (1.727 m)   Wt 129 lb 12.8 oz (58.9 kg)   SpO2 100%   BMI 19.74 kg/m   Outpatient Encounter Medications as of 03/07/2018  Medication Sig  . acetaminophen (TYLENOL) 325 MG tablet Take 650 mg by mouth every 4 (four) hours as needed.  Marland Kitchen acetaminophen (TYLENOL) 500 MG tablet Take 1,000 mg by mouth at bedtime as needed for moderate pain or headache.  Marland Kitchen amiodarone (PACERONE) 200 MG tablet Take 200 mg by mouth daily.   Derrill Memo ON 03/08/2018] apixaban (ELIQUIS) 5 MG TABS tablet Take 1 tablet (5 mg total) by mouth 2 (two) times daily.  Marland Kitchen apixaban (ELIQUIS) 5 MG TABS tablet Give 2 tablets (10 mg) by mouth two times daily starting 03/02/18 thru 03/07/18  . calcium-vitamin D (OSCAL WITH D) 500-200 MG-UNIT per tablet Take 1 tablet by mouth daily with breakfast.  . Cholecalciferol (VITAMIN D3) 5000 units CAPS Take 5,000 Units by mouth daily.   Marland Kitchen docusate sodium (COLACE) 100 MG capsule Take 100 mg by mouth daily  as needed for mild constipation.  . ferrous sulfate 325 (65 FE) MG tablet Take 325 mg by mouth daily with breakfast.  . lactose free nutrition (BOOST) LIQD Take 237 mLs by mouth daily.  Marland Kitchen levothyroxine (SYNTHROID, LEVOTHROID) 150 MCG tablet Take 150 mcg by mouth daily before breakfast.  . lisinopril (PRINIVIL,ZESTRIL) 10 MG tablet Take 10 mg by mouth daily.  . Multiple Vitamins-Minerals (CENTROVITE) TABS Take 1 tablet by mouth daily.  . NON FORMULARY Diet type: NAS, Regular consistency, thin liquids ok  . ondansetron (ZOFRAN) 4 MG tablet Take 1 tablet (4 mg total) by mouth every 6 (six) hours as needed for nausea.  Marland Kitchen oxyCODONE (OXY IR/ROXICODONE) 5 MG immediate release tablet Take 1-2 tablets (5-10 mg total) by mouth every 4 (four) hours as needed for moderate pain.  . pantoprazole (PROTONIX) 40 MG tablet Take 1 tablet (40 mg total) by mouth daily.  Marland Kitchen PARoxetine (PAXIL) 20 MG tablet Take 20 mg by mouth daily.  . potassium chloride (K-DUR) 10 MEQ tablet Take 10 mEq by mouth 2 (two) times daily.   . ranitidine (ZANTAC) 300 MG tablet Take 300 mg by mouth at bedtime.  . rosuvastatin (CRESTOR) 10 MG tablet Take 1 tablet (10 mg total) by mouth daily.  Marland Kitchen spironolactone (ALDACTONE) 25 MG tablet Take 25 mg by mouth daily.  Marland Kitchen torsemide (DEMADEX) 20 MG tablet Take 20 mg by mouth daily.   . traMADol (ULTRAM) 50 MG tablet Take 1 tablet (50 mg total) by mouth every 6 (six) hours as needed for moderate pain.  . vitamin C (ASCORBIC ACID) 250 MG tablet Take 250 mg by mouth daily.  . Wound Dressings (HYDROGEL EX) Starting 35/00/93 - Apply 1 application to left lower leg / dorsal foot wound base  then cover with calcium alginate and Allevyn patch daily.  May reuse Allevyn patch if not soiled and change every 3-5 days  . [DISCONTINUED] apixaban (ELIQUIS) 5 MG TABS tablet Take 2 tablets (10 mg total) by mouth 2 (two) times daily for 7 days.   No facility-administered encounter medications on file as of 03/07/2018.        SIGNIFICANT DIAGNOSTIC EXAMS  PREVIOUS   02-28-18: left lower extremity venous doppler: The study is positive for DVT in the left calf within the left peroneal vein. No evidence of femoral or popliteal vein DVT in the left lower extremity  03-01-18: 2-d echo:  Left ventricle: The cavity size was normal. There was mild concentric hypertrophy. Systolic function was normal. The estimated ejection fraction was in the range of 55% to 60%. Wall motion was normal; there were no regional wall motion abnormalities. The study is not technically sufficient to allow evaluation of LV diastolic function. - Mitral valve: Calcified annulus. There was mild regurgitation. - Left atrium: The atrium was mildly dilated. - Pulmonary arteries: Systolic pressure was within the normal range.   NO NEW EXAMS.   LABS REVIEWED PREVIOUS:   02-27-18: wbc 7.6; hgb 9.7; hct 30.3; mcv 101.3; plt 184; glucose 126; bun 31; creat 2.07; k+ 3.9; na++ 140; ca 8.7 03-01-18: wbc 11.0; hgb 10.0; hct 31.8; mcv 102.9; plt 218; glucose 102; bun 25; creat 1.53; k+ 3.7; na++ 138; ca 8.9   NO NEW LABS.   Review of Systems  Constitutional: Negative for malaise/fatigue.  Respiratory: Negative for cough and shortness of breath.   Cardiovascular: Negative for chest pain, palpitations and leg swelling.  Gastrointestinal: Negative for abdominal pain, constipation and heartburn.  Musculoskeletal: Negative for back pain, joint pain and myalgias.  Skin:       Has sore on left anterior foot and left lower leg.   Neurological: Negative for dizziness.  Psychiatric/Behavioral: The patient is not nervous/anxious.      Physical Exam  Constitutional: She is oriented to person, place, and time. She appears well-developed and well-nourished. No distress.  Thin   Neck: No thyromegaly present.  Cardiovascular: Normal rate, regular rhythm and normal heart sounds.  Pedal pulse on left via doppler On right very faint   Pulmonary/Chest:  Effort normal and breath sounds normal. No respiratory distress.  Abdominal: Soft. Bowel sounds are normal. She exhibits no distension. There is no tenderness.  Musculoskeletal: She exhibits edema.  1+ left lower extremity edema Is able to move all extremities Is status post left total knee replacement   Lymphadenopathy:    She has no cervical adenopathy.  Neurological: She is alert and oriented to person, place, and time.  Skin: Skin is warm and dry. She is not diaphoretic.  Bilateral lower extremities discolored due to poor circulation Left anterior foot: arterial wound: without signs of infection present Left lower anterior leg ulceration without signs of infection  Left heel with DTI Right heel soft    Psychiatric: She has a normal mood and affect.    ASSESSMENT/ PLAN:  TODAY:   1. Chronic diastolic congestive heart failure: EF 55-60%  2. Acute deep vein thrombosis (DVT) (02-28-18): 3. Primary osteoarthritis of left knee is status post left knee replacement:    Will stop oxycodone due to lethargy Will change tylenol to 1 gm every 8 hours routinely  Will continue therapy as directed  Time spent with patient and family: 45 minutes: discussed goals of care; therapy; and medications; verbalized understanding.  MD is aware of resident's narcotic use and is in agreement with current plan of care. We will attempt to wean resident as apropriate   Ok Edwards NP [facility]  Contact [phone] Monday through Friday 8am- 5pm  After hours call [phone]

## 2018-03-19 ENCOUNTER — Encounter: Payer: Medicare HMO | Attending: Physician Assistant | Admitting: Physician Assistant

## 2018-03-19 ENCOUNTER — Non-Acute Institutional Stay (SKILLED_NURSING_FACILITY): Payer: Medicare HMO | Admitting: Adult Health

## 2018-03-19 ENCOUNTER — Other Ambulatory Visit: Payer: Self-pay | Admitting: Adult Health

## 2018-03-19 ENCOUNTER — Encounter: Payer: Self-pay | Admitting: Adult Health

## 2018-03-19 DIAGNOSIS — I5032 Chronic diastolic (congestive) heart failure: Secondary | ICD-10-CM

## 2018-03-19 DIAGNOSIS — I5042 Chronic combined systolic (congestive) and diastolic (congestive) heart failure: Secondary | ICD-10-CM | POA: Diagnosis not present

## 2018-03-19 DIAGNOSIS — Z880 Allergy status to penicillin: Secondary | ICD-10-CM | POA: Diagnosis not present

## 2018-03-19 DIAGNOSIS — Z881 Allergy status to other antibiotic agents status: Secondary | ICD-10-CM | POA: Diagnosis not present

## 2018-03-19 DIAGNOSIS — L97422 Non-pressure chronic ulcer of left heel and midfoot with fat layer exposed: Secondary | ICD-10-CM | POA: Diagnosis not present

## 2018-03-19 DIAGNOSIS — Z96652 Presence of left artificial knee joint: Secondary | ICD-10-CM

## 2018-03-19 DIAGNOSIS — I130 Hypertensive heart and chronic kidney disease with heart failure and stage 1 through stage 4 chronic kidney disease, or unspecified chronic kidney disease: Secondary | ICD-10-CM

## 2018-03-19 DIAGNOSIS — I4891 Unspecified atrial fibrillation: Secondary | ICD-10-CM | POA: Diagnosis not present

## 2018-03-19 DIAGNOSIS — L89626 Pressure-induced deep tissue damage of left heel: Secondary | ICD-10-CM | POA: Insufficient documentation

## 2018-03-19 DIAGNOSIS — Z8249 Family history of ischemic heart disease and other diseases of the circulatory system: Secondary | ICD-10-CM | POA: Insufficient documentation

## 2018-03-19 DIAGNOSIS — Z888 Allergy status to other drugs, medicaments and biological substances status: Secondary | ICD-10-CM | POA: Diagnosis not present

## 2018-03-19 DIAGNOSIS — M1990 Unspecified osteoarthritis, unspecified site: Secondary | ICD-10-CM | POA: Insufficient documentation

## 2018-03-19 DIAGNOSIS — L97822 Non-pressure chronic ulcer of other part of left lower leg with fat layer exposed: Secondary | ICD-10-CM | POA: Diagnosis not present

## 2018-03-19 DIAGNOSIS — M1712 Unilateral primary osteoarthritis, left knee: Secondary | ICD-10-CM | POA: Diagnosis not present

## 2018-03-19 DIAGNOSIS — N183 Chronic kidney disease, stage 3 unspecified: Secondary | ICD-10-CM

## 2018-03-19 DIAGNOSIS — I82452 Acute embolism and thrombosis of left peroneal vein: Secondary | ICD-10-CM | POA: Diagnosis not present

## 2018-03-19 DIAGNOSIS — N184 Chronic kidney disease, stage 4 (severe): Secondary | ICD-10-CM | POA: Insufficient documentation

## 2018-03-19 MED ORDER — APIXABAN 5 MG PO TABS: 5.0000 mg | ORAL_TABLET | Freq: Two times a day (BID) | ORAL | 0 refills | Status: DC

## 2018-03-19 MED ORDER — FERROUS SULFATE 325 (65 FE) MG PO TABS: 325.0000 mg | ORAL_TABLET | Freq: Every day | ORAL | 0 refills | Status: DC

## 2018-03-19 MED ORDER — PAROXETINE HCL 20 MG PO TABS: 20.0000 mg | ORAL_TABLET | Freq: Every day | ORAL | 0 refills | Status: DC

## 2018-03-19 MED ORDER — TRAMADOL HCL 50 MG PO TABS: 50.0000 mg | ORAL_TABLET | Freq: Four times a day (QID) | ORAL | 0 refills | Status: DC | PRN

## 2018-03-19 MED ORDER — POTASSIUM CHLORIDE ER 10 MEQ PO TBCR: 10.0000 meq | EXTENDED_RELEASE_TABLET | Freq: Two times a day (BID) | ORAL | 0 refills | Status: DC

## 2018-03-19 MED ORDER — PANTOPRAZOLE SODIUM 40 MG PO TBEC: 40.0000 mg | DELAYED_RELEASE_TABLET | Freq: Every day | ORAL | 0 refills | Status: DC

## 2018-03-19 MED ORDER — AMIODARONE HCL 200 MG PO TABS: 200.0000 mg | ORAL_TABLET | Freq: Every day | ORAL | 0 refills | Status: DC

## 2018-03-19 MED ORDER — COLLAGENASE 250 UNIT/GM EX OINT: 1.0000 "application " | TOPICAL_OINTMENT | CUTANEOUS | 0 refills | Status: AC

## 2018-03-19 MED ORDER — ROSUVASTATIN CALCIUM 10 MG PO TABS: 10.0000 mg | ORAL_TABLET | Freq: Every day | ORAL | 0 refills | Status: DC

## 2018-03-19 MED ORDER — OXYCODONE HCL 5 MG PO TABS: 5.0000 mg | ORAL_TABLET | ORAL | 0 refills | Status: DC | PRN

## 2018-03-19 MED ORDER — LEVOTHYROXINE SODIUM 150 MCG PO TABS: 150.0000 ug | ORAL_TABLET | Freq: Every day | ORAL | 0 refills | Status: DC

## 2018-03-19 MED ORDER — SPIRONOLACTONE 25 MG PO TABS: 25.0000 mg | ORAL_TABLET | Freq: Every day | ORAL | 0 refills | Status: DC

## 2018-03-19 MED ORDER — RANITIDINE HCL 300 MG PO TABS: 300.0000 mg | ORAL_TABLET | Freq: Every day | ORAL | 0 refills | Status: DC

## 2018-03-19 MED ORDER — LISINOPRIL 10 MG PO TABS: 10.0000 mg | ORAL_TABLET | Freq: Every day | ORAL | 0 refills | Status: DC

## 2018-03-19 MED ORDER — ONDANSETRON HCL 4 MG PO TABS: 4.0000 mg | ORAL_TABLET | Freq: Four times a day (QID) | ORAL | 0 refills | Status: DC | PRN

## 2018-03-19 MED ORDER — TORSEMIDE 20 MG PO TABS: 20.0000 mg | ORAL_TABLET | Freq: Every day | ORAL | 0 refills | Status: DC

## 2018-03-19 NOTE — Progress Notes (Signed)
Location:   [facility] Room Number: 026 Place of Service:  SNF (31)    CODE STATUS: dnr  Allergies  Allergen Reactions  . Other Other (See Comments)    NO BP, VENIPUNCTURE, OR ACCESS IN RIGHT ARM - S/P MASTECTOMY ON RIGHT  . Amlodipine Nausea And Vomiting  . Ciprofloxacin Nausea Only  . Doxycycline Itching and Other (See Comments)    shaky  . Lipitor [Atorvastatin] Other (See Comments)    Joint pain   . Morphine And Related Nausea And Vomiting  . Macrobid [Nitrofurantoin Macrocrystal] Rash  . Penicillins Rash and Other (See Comments)    Has patient had a PCN reaction causing immediate rash, facial/tongue/throat swelling, SOB or lightheadedness with hypotension: yes Has patient had a PCN reaction causing severe rash involving mucus membranes or skin necrosis: no Has patient had a PCN reaction that required hospitalization no Has patient had a PCN reaction occurring within the last 10 years: no If all of the above answers are "NO", then may proceed with Cephalosporin use.   . Sulfa Antibiotics Rash    Chief Complaint  Patient presents with  . Discharge Note    discharge to home on 03-21-18     HPI:  She is being discharged to home with home health for pt/ot/rn. She will need her prescriptions written and will need to follow up with her medical provider. She will not need dme.  She had been hospitalized for left knee replacement. She does have ulcerations on her left lower extremity. She was admitted to this facility for short term rehab; she has participated with therapy and is ready to return back home    Past Medical History:  Diagnosis Date  . (HFpEF) heart failure with preserved ejection fraction ([place])    a. 12/2015 Echo: EF 60-65%. no rwma, Gr2 DD, triv AI, mild MR, mildly dil LA. Mild-mod TR. PASP 46mmHg.  Marland Kitchen Anemia   . Arthritis    "back, hands" (09/08/2015)  . Cancer of right breast [facility]) 2007, 2013   right breast- 2007 radiation-mastectomy  .  Chronic lower back pain   . CKD (chronic kidney disease), stage IV ([place])   . Complication of anesthesia    "took me about 1 week to know what was going on after one of my knee ORs"  . COPD (chronic obstructive pulmonary disease) ([place])   . DDD (degenerative disc disease), lumbar   . Depression   . 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 Edema    FEET/ANKLES  . GERD (gastroesophageal reflux disease)   . History of sepsis   . History of stress test    a. 08/2010: EF 73%, no ischemia/infarct.  . Hyperlipidemia   . Hypertension   . Hypothyroid   . PAD (peripheral artery disease) ([place])    a. 08/2015 s/p PTA of R AT w/ drug-coated balloon angioplasty to R Popliteal; b. 05/2017 ABI: R 1.16, L 0.88-->stable.  Marland Kitchen PAF (paroxysmal atrial fibrillation) ([place])    a. 08/2010 s/p DCCV;  b. CHA2DS2VASc = 6-->No OAC 2/2 h/o falls.    Past Surgical History:  Procedure Laterality Date  . ABDOMINAL EXPLORATION SURGERY     "had to go back in after hysterectomy & check on stitch dr had put near my bladder; don't know if they took it out; had to wear catheter for 1 month"  . ABDOMINAL HYSTERECTOMY    . APPENDECTOMY    . BACK SURGERY    .  BALLOON ANGIOPLASTY, ARTERY Right 09/08/2015   superficial femoral  . BREAST EXCISIONAL BIOPSY Right 2007   positive  . BREAST SURGERY    . CARDIOVERSION  11/10/2011   Procedure: CARDIOVERSION;  Surgeon: Lelon Perla, MD;  Location: [facility];  Service: Cardiovascular;  Laterality: N/A;  . CARDIOVERSION  08/26/2010  . CATARACT EXTRACTION W/PHACO Right 07/17/2017   Procedure: CATARACT EXTRACTION PHACO AND INTRAOCULAR LENS PLACEMENT (IOC);  Surgeon: Birder Robson, MD;  Location: [facility];  Service: Ophthalmology;  Laterality: Right;  [place] 00:44.0 AP% 14.6 CDE 6.44 FLUID PACK LOT # [id number]  . CATARACT EXTRACTION W/PHACO Left 08/07/2017   Procedure: CATARACT EXTRACTION PHACO AND INTRAOCULAR LENS PLACEMENT (IOC);  Surgeon: Birder Robson, MD;  Location:  [facility];  Service: Ophthalmology;  Laterality: Left;  [place] 00:44 AP% 14.1 CDE 6.32 Fluid pack lot # [id number]  . COLONOSCOPY WITH PROPOFOL N/A 01/04/2016   Procedure: COLONOSCOPY WITH PROPOFOL;  Surgeon: Lollie Sails, MD;  Location: [facility] ENDOSCOPY;  Service: Endoscopy;  Laterality: N/A;  . HALLUX VALGUS CORRECTION    . JOINT REPLACEMENT    . LUMBAR DISC SURGERY    . MASTECTOMY Right 2013   positive  . PERIPHERAL VASCULAR CATHETERIZATION N/A 09/08/2015   Procedure: Abdominal Aortogram w/Lower Extremity;  Surgeon: Wellington Hampshire, MD;  Location: [place] CV LAB;  Service: Cardiovascular;  Laterality: N/A;  . PERIPHERAL VASCULAR CATHETERIZATION  09/08/2015   Procedure: Peripheral Vascular Balloon Angioplasty;  Surgeon: Wellington Hampshire, MD;  Location: [place] CV LAB;  Service: Cardiovascular;;  . REVISION TOTAL KNEE ARTHROPLASTY Right   . rt.ankle ulcer surgery  2013  . TEMPOROMANDIBULAR JOINT ARTHROPLASTY    . TMJ ARTHROPLASTY    . TONSILLECTOMY    . TOTAL KNEE ARTHROPLASTY Right   . TOTAL KNEE ARTHROPLASTY Left 02/26/2018   Procedure: TOTAL KNEE ARTHROPLASTY;  Surgeon: Corky Mull, MD;  Location: [facility] ORS;  Service: Orthopedics;  Laterality: Left;  Marland Kitchen VASCULAR SURGERY      Social History   Socioeconomic History  . Marital status: Married    Spouse name: Not on file  . Number of children: 2  . Years of education: Not on file  . Highest education level: Not on file  Occupational History  . Not on file  Social Needs  . Financial resource strain: Not on file  . Food insecurity:    Worry: Not on file    Inability: Not on file  . Transportation needs:    Medical: Not on file    Non-medical: Not on file  Tobacco Use  . Smoking status: Never Smoker  . Smokeless tobacco: Never Used  Substance and Sexual Activity  . Alcohol use: No  . Drug use: No  . Sexual activity: Never    Birth control/protection: Post-menopausal  Lifestyle  . Physical activity:    Days per  week: Not on file    Minutes per session: Not on file  . Stress: Not on file  Relationships  . Social connections:    Talks on phone: Not on file    Gets together: Not on file    Attends religious service: Not on file    Active member of club or organization: Not on file    Attends meetings of clubs or organizations: Not on file    Relationship status: Not on file  . Intimate partner violence:    Fear of current or ex partner: Not on file    Emotionally abused: Not on file  Physically abused: Not on file    Forced sexual activity: Not on file  Other Topics Concern  . Not on file  Social History Narrative  . Not on file   Family History  Problem Relation Age of Onset  . Heart attack Father   . Coronary artery disease Sister 71       MI  . Breast cancer Maternal Aunt     VITAL SIGNS BP 131/81   Pulse 74   Temp 97.8 F (36.6 C)   Resp 18   Ht 5\' 8"  (1.727 m)   Wt 122 lb (55.3 kg)   SpO2 98%   BMI 18.55 kg/m   Patient's Medications  New Prescriptions   No medications on file  Previous Medications   ACETAMINOPHEN (TYLENOL) 325 MG TABLET    Take 650 mg by mouth every 4 (four) hours as needed.   ACETAMINOPHEN (TYLENOL) 500 MG TABLET    Take 1,000 mg by mouth at bedtime as needed for moderate pain or headache.   AMIODARONE (PACERONE) 200 MG TABLET    Take 200 mg by mouth daily.    APIXABAN (ELIQUIS) 5 MG TABS TABLET    Take 1 tablet (5 mg total) by mouth 2 (two) times daily.   APIXABAN (ELIQUIS) 5 MG TABS TABLET    Give 2 tablets (10 mg) by mouth two times daily starting 03/02/18 thru 03/07/18   CALCIUM-VITAMIN D (OSCAL WITH D) 500-200 MG-UNIT PER TABLET    Take 1 tablet by mouth daily with breakfast.   CHOLECALCIFEROL (VITAMIN D3) 5000 UNITS CAPS    Take 5,000 Units by mouth daily.    COLLAGENASE (SANTYL) OINTMENT    Apply 1 application topically every 3 (three) days. Cleanse wound left ankle with saline and skin prep skin surrounding wound: apply 1/4" strip nickel  thick to wound bed cover with moist saline gauze and mepiplex patch every 3 days.   DOCUSATE SODIUM (COLACE) 100 MG CAPSULE    Take 100 mg by mouth daily as needed for mild constipation.   FERROUS SULFATE 325 (65 FE) MG TABLET    Take 325 mg by mouth daily with breakfast.   LACTOSE FREE NUTRITION (BOOST) LIQD    Take 237 mLs by mouth daily.   LEVOTHYROXINE (SYNTHROID, LEVOTHROID) 150 MCG TABLET    Take 150 mcg by mouth daily before breakfast.   LISINOPRIL (PRINIVIL,ZESTRIL) 10 MG TABLET    Take 10 mg by mouth daily.   MULTIPLE VITAMINS-MINERALS (CENTROVITE) TABS    Take 1 tablet by mouth daily.   NON FORMULARY    Diet type: NAS, Regular consistency, thin liquids ok   ONDANSETRON (ZOFRAN) 4 MG TABLET    Take 1 tablet (4 mg total) by mouth every 6 (six) hours as needed for nausea.   OXYCODONE (OXY IR/ROXICODONE) 5 MG IMMEDIATE RELEASE TABLET    Take 1-2 tablets (5-10 mg total) by mouth every 4 (four) hours as needed for moderate pain.   PANTOPRAZOLE (PROTONIX) 40 MG TABLET    Take 1 tablet (40 mg total) by mouth daily.   PAROXETINE (PAXIL) 20 MG TABLET    Take 20 mg by mouth daily.   POTASSIUM CHLORIDE (K-DUR) 10 MEQ TABLET    Take 10 mEq by mouth 2 (two) times daily.    RANITIDINE (ZANTAC) 300 MG TABLET    Take 300 mg by mouth at bedtime.   ROSUVASTATIN (CRESTOR) 10 MG TABLET    Take 1 tablet (10 mg total) by mouth daily.  SPIRONOLACTONE (ALDACTONE) 25 MG TABLET    Take 25 mg by mouth daily.   TORSEMIDE (DEMADEX) 20 MG TABLET    Take 20 mg by mouth daily.    TRAMADOL (ULTRAM) 50 MG TABLET    Take 1 tablet (50 mg total) by mouth every 6 (six) hours as needed for moderate pain.   VITAMIN C (ASCORBIC ACID) 250 MG TABLET    Take 250 mg by mouth daily.   WOUND DRESSINGS (ALLEVYN GENTLE BORDER EX)    Apply 1 patch topically every 3 (three) days. Apply to left lower extremity every 3 days cleanse with saline; apply skin prep to surrounding skin and apply patch every 3 days.  Modified Medications   No  medications on file  Discontinued Medications   SILVER (ALLEVYN AG ADHESIVE EX)    Apply 1 patch topically every 3 (three) days. Apply to left lower leg wound every 3 days; cleanse with saline use skin prep on skin surrounding wound every 3 days.   SILVER (ALLEVYN AG GENTLE BORDER EX)    Apply 1 patch topically every 3 (three) days. Apply to left lower leg ulceration every 3 days cleanse wound with saline apply skin prep to periwound and over with allevyn every 3 days.   WOUND DRESSINGS (HYDROGEL EX)    Starting 33/[birth year] - Apply 1 application to left lower leg / dorsal foot wound base then cover with calcium alginate and Allevyn patch daily.  May reuse Allevyn patch if not soiled and change every 3-5 days     SIGNIFICANT DIAGNOSTIC EXAMS   PREVIOUS   02-28-18: left lower extremity venous doppler: The study is positive for DVT in the left calf within the left peroneal vein. No evidence of femoral or popliteal vein DVT in the left lower extremity  03-01-18: 2-d echo:  Left ventricle: The cavity size was normal. There was mild concentric hypertrophy. Systolic function was normal. The estimated ejection fraction was in the range of 55% to 60%. Wall motion was normal; there were no regional wall motion abnormalities. The study is not technically sufficient to allow evaluation of LV diastolic function. - Mitral valve: Calcified annulus. There was mild regurgitation. - Left atrium: The atrium was mildly dilated. - Pulmonary arteries: Systolic pressure was within the normal range.   NO NEW EXAMS.   LABS REVIEWED PREVIOUS:   02-27-18: wbc 7.6; hgb 9.7; hct 30.3; mcv 101.3; plt 184; glucose 126; bun 31; creat 2.07; k+ 3.9; na++ 140; ca 8.7 03-01-18: wbc 11.0; hgb 10.0; hct 31.8; mcv 102.9; plt 218; glucose 102; bun 25; creat 1.53; k+ 3.7; na++ 138; ca 8.9   NO NEW LABS.   Review of Systems  Constitutional: Negative for malaise/fatigue.  Respiratory: Negative for cough and shortness of breath.    Cardiovascular: Negative for chest pain, palpitations and leg swelling.  Gastrointestinal: Negative for abdominal pain, constipation and heartburn.  Musculoskeletal: Negative for back pain, joint pain and myalgias.  Skin:       Ulcers on left leg   Neurological: Negative for dizziness.  Psychiatric/Behavioral: The patient is not nervous/anxious.    Physical Exam  Constitutional: She is oriented to person, place, and time. She appears well-developed and well-nourished. No distress.  thin  Neck: No thyromegaly present.  Cardiovascular: Normal rate, regular rhythm and normal heart sounds.  Pedal pulse on left via doppler On right very faint    Pulmonary/Chest: Effort normal and breath sounds normal. No respiratory distress.  Abdominal: Soft. Bowel sounds are normal. She  exhibits no distension. There is no tenderness.  Musculoskeletal: She exhibits edema.  1+ left lower extremity edema Is able to move all extremities Is status post left total knee replacement    Lymphadenopathy:    She has no cervical adenopathy.  Neurological: She is alert and oriented to person, place, and time.  Skin: Skin is warm and dry. She is not diaphoretic.  Bilateral lower extremities discolored due to poor circulation Left anterior foot: arterial wound: without signs of infection present Left lower anterior leg ulceration without signs of infection   Psychiatric: She has a normal mood and affect.     ASSESSMENT/ PLAN:  Patient is being discharged with the following home health services: pt/ot/rn: to evaluate and treat as indicated for gait balance strength adl training medication management and wound management.    Patient is being discharged with the following durable medical equipment:  None needed   Patient has been advised to f/u with their PCP in 1-2 weeks to bring them up to date on their rehab stay.  Social services at facility was responsible for arranging this appointment.  Pt was provided with  a 30 day supply of prescriptions for medications and refills must be obtained from their PCP.  For controlled substances, a more limited supply may be provided adequate until PCP appointment only.  A 30 day supply of her prescription medications have been sent to [facility] in [place] with #20 ultram 50 mg tabs and #20 oxycodone 5 mg tabs.    Time spent with patient: medications; home health needs; dme; verbalized understanding.    Ok Edwards NP [facility]  Contact [phone] Monday through Friday 8am- 5pm  After hours call [phone]

## 2018-03-23 NOTE — Progress Notes (Signed)
SHALISE, ROSADO ([id number]) Visit Report for 03/19/2018 Chief Complaint Document Details Patient Name: [name], [name]. Date of Service: 03/19/2018 10:30 AM Medical Record Number: [id number] Patient Account Number: [id number] Date of Birth/Sex: [birth year] (85 y.o. F) Treating RN: Montey Hora Primary Care Provider: Emily Filbert Other Clinician: Referring Provider: Emily Filbert Treating Provider/Extender: Melburn Hake, HOYT Weeks in Treatment: 0 Information Obtained from: Patient Chief Complaint Left heel DTI and left LE ulcer Electronic Signature(s) Signed: 03/21/2018 1:15:36 AM By: Worthy Keeler PA-C Entered By: Worthy Keeler on 03/19/2018 11:19:47 Degroote, [name] ([id number]) -------------------------------------------------------------------------------- Debridement Details Patient Name: [name] Date of Service: 03/19/2018 10:30 AM Medical Record Number: [id number] Patient Account Number: [id number] Date of Birth/Sex: 04-28-1932 (85 y.o. F) Treating RN: Montey Hora Primary Care Provider: Emily Filbert Other Clinician: Referring Provider: Emily Filbert Treating Provider/Extender: Melburn Hake, HOYT Weeks in Treatment: 0 Debridement Performed for Wound #15 Left,Distal,Anterior Lower Leg Assessment: Performed By: Clinician Montey Hora, RN Debridement Type: Chemical/Enzymatic/Mechanical Agent Used: Santyl Level of Consciousness (Pre- Awake and Alert procedure): Pre-procedure Verification/Time Yes - 11:05 Out Taken: Start Time: 11:05 Pain Control: Lidocaine 4% Topical Solution Instrument: Other : tongue depressor Bleeding: None End Time: 11:06 Procedural Pain: 0 Post Procedural Pain: 0 Response to Treatment: Procedure was tolerated well Level of Consciousness Awake and Alert (Post-procedure): Post Debridement Measurements of Total Wound Length: (cm) 4.7 Width: (cm) 4 Depth: (cm) 0.2 Volume: (cm) 2.953 Character of Wound/Ulcer Post Debridement: Requires Further  Debridement Post Procedure Diagnosis Same as Pre-procedure Electronic Signature(s) Signed: 03/19/2018 1:35:11 PM By: Montey Hora Signed: 03/21/2018 1:15:36 AM By: Worthy Keeler PA-C Entered By: Montey Hora on 03/19/2018 13:35:11 Gohr, [name] ([id number]) -------------------------------------------------------------------------------- HPI Details Patient Name: [name] Date of Service: 03/19/2018 10:30 AM Medical Record Number: [id number] Patient Account Number: [id number] Date of Birth/Sex: 1932-08-17 (85 y.o. F) Treating RN: Montey Hora Primary Care Provider: Emily Filbert Other Clinician: Referring Provider: Emily Filbert Treating Provider/Extender: Melburn Hake, HOYT Weeks in Treatment: 0 History of Present Illness HPI Description: 84 year old patient who is known to the wound center for several years now has a new injury to the right lower extremity where she bumped herself against the bath tub. He also has had a chronic right lateral malleolus open wound which has been coming on and off for several years now. This has been there for at least 2014 and was healed out in November 2016. In the past she has been seen by her cardiologist Dr. Kathlyn Sacramento in April 2016. Noninvasive vascular evaluation showed an ABI of 0.68 on the right and 0.85 on the left. 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 One-vessel runoff below the knee via the peroneal artery with short occlusion of the anterior tibial artery and reconstitution. No intervention performed. Most recently she was seen for a wound on her right lower extremity which was healed out by March 15 She is not a smoker and has had no diabetes mellitus. 07/29/2015 -- the appointment with her cardiologist Dr. Fletcher Anon is still pending for review of her arterial duplex studies. Readmission: 03/19/18 on evaluation today patient presents for readmission concerning an  issue she has been having with her left heel and left anterior lower extremity. She has a history of atrial fibrillation and had knee surgery for a total knee replacement three weeks ago today. Subsequently since being discharged from the hospital it was noted that she did have a left heel deep tissue  injury as well the left anterior lower extremity ulcer. This has been present apparently for roughly 2 weeks. She does not appear to have any evidence of infection which is good news. She has been tolerating the dressing changes that have been performed it sounds as if Santyl is what has been used at least most recently. Obviously I think this is likely a good treatment choice for her. Especially in light of the fact that the wound on the distal lower extremity of the left leg appears to show signs of significant slough/eschar buildup which is starting to loosen up now that she is using the central but prior was fairly significant as far as how hard and black it was. She does have pain at both sites the open wound more than the deep tissue injury. Otherwise the patient does have a history of peripheral vascular disease, congestive heart failure, chronic kidney disease stage IV, and now the presence of a left artificial knee joint. Electronic Signature(s) Signed: 03/21/2018 1:15:36 AM By: Worthy Keeler PA-C Entered By: Worthy Keeler on 03/21/2018 01:05:38 Chovan, [name] ([id number]) -------------------------------------------------------------------------------- Physical Exam Details Patient Name: [name] Date of Service: 03/19/2018 10:30 AM Medical Record Number: [id number] Patient Account Number: [id number] Date of Birth/Sex: [birth year] (85 y.o. F) Treating RN: Montey Hora Primary Care Provider: Emily Filbert Other Clinician: Referring Provider: Emily Filbert Treating Provider/Extender: Melburn Hake, HOYT Weeks in Treatment: 0 Constitutional sitting or standing blood pressure is within target  range for patient.. pulse regular and within target range for patient.Marland Kitchen respirations regular, non-labored and within target range for patient.Marland Kitchen temperature within target range for patient.. Well- nourished and well-hydrated in no acute distress. Eyes conjunctiva clear no eyelid edema noted. pupils equal round and reactive to light and accommodation. Ears, Nose, Mouth, and Throat no gross abnormality of ear auricles or external auditory canals. normal hearing noted during conversation. mucus membranes moist. Respiratory normal breathing without difficulty. clear to auscultation bilaterally. Cardiovascular regular rate and rhythm with normal S1, S2. 1+ dorsalis pedis/posterior tibialis pulses. no clubbing, cyanosis, significant edema, <3 sec cap refill. Gastrointestinal (GI) soft, non-tender, non-distended, +BS. no ventral hernia noted. Musculoskeletal Patient unable to walk without assistance. Psychiatric this patient is able to make decisions and demonstrates good insight into disease process. Alert and Oriented x 3. pleasant and cooperative. Notes Patient's wound bed currently shows again significant E type tissue noted on the surface of the left distal lower extremity ulcer. This was significantly tender to touch and therefore I do not think is gonna be amenable to sharp debridement at least not today. Nonetheless I do believe that the Santyl was likely a good option for her to continue at this point. She is able to get around at this time although she does use an assistive device following the replacement she is getting used to walking a little bit more little by little. Nonetheless I do question how much this is going to benefit her obviously we do not want to get week and she needs to move and build pretty backup although she also needs to protect the deep tissue injury to ensure this doesn't open significantly. There is already a small opening noted where there is some drainage  although I'm hoping that this will seal up and firm up not needing or requiring any further sharp debridement. Electronic Signature(s) Signed: 03/21/2018 1:15:36 AM By: Worthy Keeler PA-C Entered By: Worthy Keeler on 03/21/2018 01:07:41 Fralix, [name] ([id number]) -------------------------------------------------------------------------------- Physician Orders Details Patient  Name: BRIA, SPARR. Date of Service: 03/19/2018 10:30 AM Medical Record Number: [id number] Patient Account Number: [id number] Date of Birth/Sex: 07/16/1932 (85 y.o. F) Treating RN: Montey Hora Primary Care Provider: Emily Filbert Other Clinician: Referring Provider: Emily Filbert Treating Provider/Extender: Melburn Hake, HOYT Weeks in Treatment: 0 Verbal / Phone Orders: No Diagnosis Coding ICD-10 Coding Code Description S81.802A Unspecified open wound, left lower leg, initial encounter L97.822 Non-pressure chronic ulcer of other part of left lower leg with fat layer exposed L89.626 Pressure-induced deep tissue damage of left heel I73.9 Peripheral vascular disease, unspecified I50.42 Chronic combined systolic (congestive) and diastolic (congestive) heart failure N18.4 Chronic kidney disease, stage 4 (severe) P61.950 Presence of left artificial knee joint Wound Cleansing Wound #15 Left,Distal,Anterior Lower Leg o Cleanse wound with mild soap and water Wound #16 Left Calcaneus o Cleanse wound with mild soap and water Primary Wound Dressing Wound #15 Left,Distal,Anterior Lower Leg o Santyl Ointment Wound #16 Left Calcaneus o Silver Alginate - small piece of silver alginate on the small draining area, betadine paint to DTI covered with dry gauze and bordered foam dressing Secondary Dressing Wound #15 Left,Distal,Anterior Lower Leg o Boardered Foam Dressing Wound #16 Left Calcaneus o Boardered Foam Dressing Dressing Change Frequency Wound #15 Left,Distal,Anterior Lower Leg o Change dressing every  day. Wound #16 Left Calcaneus o Change dressing every day. Follow-up Appointments Wound #15 Left,Distal,Anterior Lower Leg BODHI, STENGLEIN ([id number]) o Return Appointment in 1 week. Wound #16 Left Calcaneus o Return Appointment in 1 week. Off-Loading Wound #16 Left Calcaneus o Multipodus Splint - Therapy to provide patient with a multipodus splint for use while walking and for use once patient returns home Electronic Signature(s) Signed: 03/19/2018 5:21:38 PM By: Montey Hora Signed: 03/21/2018 1:15:36 AM By: Worthy Keeler PA-C Entered By: Montey Hora on 03/19/2018 11:47:15 Tozzi, [name] ([id number]) -------------------------------------------------------------------------------- Problem List Details Patient Name: [name] Date of Service: 03/19/2018 10:30 AM Medical Record Number: [id number] Patient Account Number: [id number] Date of Birth/Sex: 04-May-1932 (85 y.o. F) Treating RN: Montey Hora Primary Care Provider: Emily Filbert Other Clinician: Referring Provider: Emily Filbert Treating Provider/Extender: Melburn Hake, HOYT Weeks in Treatment: 0 Active Problems ICD-10 Evaluated Encounter Code Description Active Date Today Diagnosis S81.802A Unspecified open wound, left lower leg, initial encounter 03/19/2018 No Yes L97.822 Non-pressure chronic ulcer of other part of left lower leg with 03/19/2018 No Yes fat layer exposed L89.626 Pressure-induced deep tissue damage of left heel 03/19/2018 No Yes I73.9 Peripheral vascular disease, unspecified 03/19/2018 No Yes I50.42 Chronic combined systolic (congestive) and diastolic 41/12/3788 No Yes (congestive) heart failure N18.4 Chronic kidney disease, stage 4 (severe) 03/19/2018 No Yes Z96.652 Presence of left artificial knee joint 03/19/2018 No Yes Inactive Problems Resolved Problems Electronic Signature(s) Signed: 03/21/2018 1:15:36 AM By: Worthy Keeler PA-C Entered By: Worthy Keeler on 03/19/2018 11:19:21 Rumberger, [name]  ([id number]) -------------------------------------------------------------------------------- Progress Note/History and Physical Details Patient Name: [name] Date of Service: 03/19/2018 10:30 AM Medical Record Number: [id number] Patient Account Number: [id number] Date of Birth/Sex: May 29, 1932 (85 y.o. F) Treating RN: Montey Hora Primary Care Provider: Emily Filbert Other Clinician: Referring Provider: Emily Filbert Treating Provider/Extender: Melburn Hake, HOYT Weeks in Treatment: 0 Subjective Chief Complaint Information obtained from Patient Left heel DTI and left LE ulcer History of Present Illness (HPI) 84 year old patient who is known to the wound center for several years now has a new injury to the right lower extremity where she bumped herself against the bath tub. He also has had a  chronic right lateral malleolus open wound which has been coming on and off for several years now. This has been there for at least 2014 and was healed out in November 2016. In the past she has been seen by her cardiologist Dr. Kathlyn Sacramento in April 2016. Noninvasive vascular evaluation showed an ABI of 0.68 on the right and 0.85 on the left. 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 One-vessel runoff below the knee via the peroneal artery with short occlusion of the anterior tibial artery and reconstitution. No intervention performed. Most recently she was seen for a wound on her right lower extremity which was healed out by March 15 She is not a smoker and has had no diabetes mellitus. 07/29/2015 -- the appointment with her cardiologist Dr. Fletcher Anon is still pending for review of her arterial duplex studies. Readmission: 03/19/18 on evaluation today patient presents for readmission concerning an issue she has been having with her left heel and left anterior lower extremity. She has a history of atrial fibrillation and had knee  surgery for a total knee replacement three weeks ago today. Subsequently since being discharged from the hospital it was noted that she did have a left heel deep tissue injury as well the left anterior lower extremity ulcer. This has been present apparently for roughly 2 weeks. She does not appear to have any evidence of infection which is good news. She has been tolerating the dressing changes that have been performed it sounds as if Santyl is what has been used at least most recently. Obviously I think this is likely a good treatment choice for her. Especially in light of the fact that the wound on the distal lower extremity of the left leg appears to show signs of significant slough/eschar buildup which is starting to loosen up now that she is using the central but prior was fairly significant as far as how hard and black it was. She does have pain at both sites the open wound more than the deep tissue injury. Otherwise the patient does have a history of peripheral vascular disease, congestive heart failure, chronic kidney disease stage IV, and now the presence of a left artificial knee joint. Wound History Patient presents with 1 open wound that has been present for approximately 2 weeks. Laboratory tests have been performed in the last month. Patient reportedly has tested positive for an antibiotic resistant organism. Patient reportedly has not tested positive for osteomyelitis. Patient reportedly has not had testing performed to evaluate circulation in the legs. Patient History Information obtained from Patient. Allergies Lipitor (Severity: Moderate, Reaction: joint pain), Penicillins (Severity: Moderate, Reaction: rash), Vioxx (Reaction: rash), Cipro (Reaction: nausea), doxycycline (Severity: Moderate, Reaction: nausea, aching, itching, shaky), amlodipine (Reaction: Nausea and vomiting), Macrobid (Reaction: rash) Mathey, KAYSI OURADA ([id number]) Family History Cancer - Siblings, Heart Disease -  Father, Hypertension - Siblings, Stroke - Mother, No family history of Hereditary Spherocytosis, Kidney Disease, Lung Disease, Seizures, Thyroid Problems, Tuberculosis. Social History Never smoker, Marital Status - Married, Alcohol Use - Never, Drug Use - No History, Caffeine Use - Daily. Medical History Eyes Patient has history of Cataracts Denies history of Glaucoma, Optic Neuritis Ear/Nose/Mouth/Throat Denies history of Chronic sinus problems/congestion, Middle ear problems Hematologic/Lymphatic Patient has history of Anemia Denies history of Hemophilia, Human Immunodeficiency Virus, Lymphedema, Sickle Cell Disease Respiratory Denies history of Aspiration, Asthma, Chronic Obstructive Pulmonary Disease (COPD), Pneumothorax, Sleep Apnea, Tuberculosis Cardiovascular Patient has history of Arrhythmia - A fib,  Hypertension Denies history of Angina, Congestive Heart Failure, Coronary Artery Disease, Hypotension, Myocardial Infarction, Peripheral Arterial Disease, Peripheral Venous Disease, Phlebitis, Vasculitis Gastrointestinal Denies history of Cirrhosis , Colitis, Crohn s, Hepatitis A, Hepatitis B, Hepatitis C Endocrine Denies history of Type I Diabetes, Type II Diabetes Genitourinary Denies history of End Stage Renal Disease Immunological Denies history of Lupus Erythematosus, Raynaud s, Scleroderma Integumentary (Skin) Denies history of History of Burn, History of pressure wounds Musculoskeletal Patient has history of Osteoarthritis Denies history of Gout, Rheumatoid Arthritis Neurologic Denies history of Dementia, Neuropathy, Quadriplegia, Paraplegia, Seizure Disorder Oncologic Patient has history of Received Radiation Denies history of Received Chemotherapy Psychiatric Denies history of Anorexia/bulimia, Confinement Anxiety Hospitalization/Surgery History - 09/16/2014, [facility], (R) knee replacement. Medical And Surgical History Notes Ear/Nose/Mouth/Throat HOH- hearing  aids Genitourinary frequent UTIs Oncologic Breast Ca- R mastectomy Review of Systems (ROS) Eyes Denies complaints or symptoms of Dry Eyes, Vision Changes, Glasses / Contacts. Ear/Nose/Mouth/Throat Denies complaints or symptoms of Difficult clearing ears, Sinusitis. Hematologic/Lymphatic Litz, KALIS FRIESE ([id number]) Denies complaints or symptoms of Bleeding / Clotting Disorders, Human Immunodeficiency Virus. Respiratory Denies complaints or symptoms of Chronic or frequent coughs, Shortness of Breath. Cardiovascular Denies complaints or symptoms of Chest pain, LE edema. Gastrointestinal Complains or has symptoms of Frequent diarrhea. Denies complaints or symptoms of Nausea, Vomiting. Endocrine Denies complaints or symptoms of Hepatitis, Thyroid disease, Polydypsia (Excessive Thirst). Genitourinary Denies complaints or symptoms of Kidney failure/ Dialysis, Incontinence/dribbling. Immunological Denies complaints or symptoms of Hives, Itching. Integumentary (Skin) Complains or has symptoms of Bleeding or bruising tendency. Denies complaints or symptoms of Wounds, Breakdown, Swelling. Musculoskeletal Denies complaints or symptoms of Muscle Pain, Muscle Weakness. Neurologic Denies complaints or symptoms of Numbness/parasthesias, Focal/Weakness. Psychiatric Denies complaints or symptoms of Anxiety, Claustrophobia. Objective Constitutional sitting or standing blood pressure is within target range for patient.. pulse regular and within target range for patient.Marland Kitchen respirations regular, non-labored and within target range for patient.Marland Kitchen temperature within target range for patient.. Well- nourished and well-hydrated in no acute distress. Vitals Time Taken: 10:30 AM, Height: 68 in, Source: Stated, Weight: 121 lbs, Source: Stated, BMI: 18.4, Temperature: 98.1  F, Pulse: 72 bpm, Respiratory Rate: 18 breaths/min, Blood Pressure: 95/64 mmHg. Eyes conjunctiva clear no eyelid edema noted. pupils  equal round and reactive to light and accommodation. Ears, Nose, Mouth, and Throat no gross abnormality of ear auricles or external auditory canals. normal hearing noted during conversation. mucus membranes moist. Respiratory normal breathing without difficulty. clear to auscultation bilaterally. Cardiovascular regular rate and rhythm with normal S1, S2. 1+ dorsalis pedis/posterior tibialis pulses. no clubbing, cyanosis, significant edema, Gastrointestinal (GI) soft, non-tender, non-distended, +BS. no ventral hernia noted. KIMBERLE, STANFILL ([id number]) Musculoskeletal Patient unable to walk without assistance. Psychiatric this patient is able to make decisions and demonstrates good insight into disease process. Alert and Oriented x 3. pleasant and cooperative. General Notes: Patient's wound bed currently shows again significant E type tissue noted on the surface of the left distal lower extremity ulcer. This was significantly tender to touch and therefore I do not think is gonna be amenable to sharp debridement at least not today. Nonetheless I do believe that the Santyl was likely a good option for her to continue at this point. She is able to get around at this time although she does use an assistive device following the replacement she is getting used to walking a little bit more little by little. Nonetheless I do question how much this is going to benefit her obviously we do  not want to get week and she needs to move and build pretty backup although she also needs to protect the deep tissue injury to ensure this doesn't open significantly. There is already a small opening noted where there is some drainage although I'm hoping that this will seal up and firm up not needing or requiring any further sharp debridement. Integumentary (Hair, Skin) Wound #15 status is Open. Original cause of wound was Shear/Friction. The wound is located on the [facility] Lower Leg. The wound measures  4.7cm length x 4cm width x 0.2cm depth; 14.765cm^2 area and 2.953cm^3 volume. There is Fat Layer (Subcutaneous Tissue) Exposed exposed. There is no tunneling or undermining noted. There is a small amount of serous drainage noted. There is no granulation within the wound bed. There is a large (67-100%) amount of necrotic tissue within the wound bed including Adherent Slough. The periwound skin appearance exhibited: Erythema. The periwound skin appearance did not exhibit: Callus, Crepitus, Excoriation, Induration, Rash, Scarring, Dry/Scaly, Maceration, Atrophie Blanche, Cyanosis, Ecchymosis, Hemosiderin Staining, Mottled, Pallor, Rubor. The surrounding wound skin color is noted with erythema which is circumferential. Wound #16 status is Open. Original cause of wound was Pressure Injury. The wound is located on the Left Calcaneus. The wound measures 1.5cm length x 0.4cm width x 0.1cm depth; 0.471cm^2 area and 0.047cm^3 volume. There is Fat Layer (Subcutaneous Tissue) Exposed exposed. There is no tunneling or undermining noted. There is a medium amount of serous drainage noted. The wound margin is indistinct and nonvisible. There is small (1-33%) pink granulation within the wound bed. There is a medium (34-66%) amount of necrotic tissue within the wound bed including Adherent Slough. The periwound skin appearance did not exhibit: Callus, Crepitus, Excoriation, Induration, Rash, Scarring, Dry/Scaly, Maceration, Atrophie Blanche, Cyanosis, Ecchymosis, Hemosiderin Staining, Mottled, Pallor, Rubor, Erythema. Periwound temperature was noted as No Abnormality. The periwound has tenderness on palpation. General Notes: open wound is surrounded by DTI 7.5 x 6.3 x .1 Other Condition(s) Patient presents with Suspected Deep Tissue Injury located on the Left Foot. The skin appearance did not exhibit: Atrophie Blanche, Callus, Crepitus, Cyanosis, Dry/Scaly, Ecchymosis, Erythema, Excoriation, Friable, Hemosiderin  Staining, Induration, Maceration, Mottled, Pallor, Rash, Rubor, Scarring. Assessment Active Problems ICD-10 Unspecified open wound, left lower leg, initial encounter Non-pressure chronic ulcer of other part of left lower leg with fat layer exposed Pressure-induced deep tissue damage of left heel Peripheral vascular disease, unspecified Chronic combined systolic (congestive) and diastolic (congestive) heart failure Chronic kidney disease, stage 4 (severe) Presence of left artificial knee joint Bachmeier, Melynda A. ([id number]) Procedures Wound #15 Pre-procedure diagnosis of Wound #15 is a Skin Tear located on the Left,Distal,Anterior Lower Leg . There was a Chemical/Enzymatic/Mechanical debridement performed by Montey Hora, RN. With the following instrument(s): tongue depressor after achieving pain control using Lidocaine 4% Topical Solution. Agent used was [employer]. A time out was conducted at 11:05, prior to the start of the procedure. There was no bleeding. The procedure was tolerated well with a pain level of 0 throughout and a pain level of 0 following the procedure. Post Debridement Measurements: 4.7cm length x 4cm width x 0.2cm depth; 2.953cm^3 volume. Character of Wound/Ulcer Post Debridement requires further debridement. Post procedure Diagnosis Wound #15: Same as Pre-Procedure Plan Wound Cleansing: Wound #15 Left,Distal,Anterior Lower Leg: Cleanse wound with mild soap and water Wound #16 Left Calcaneus: Cleanse wound with mild soap and water Primary Wound Dressing: Wound #15 Left,Distal,Anterior Lower Leg: Santyl Ointment Wound #16 Left Calcaneus: Silver Alginate - small piece of silver  alginate on the small draining area, betadine paint to DTI covered with dry gauze and bordered foam dressing Secondary Dressing: Wound #15 Left,Distal,Anterior Lower Leg: Boardered Foam Dressing Wound #16 Left Calcaneus: Boardered Foam Dressing Dressing Change Frequency: Wound #15  Left,Distal,Anterior Lower Leg: Change dressing every day. Wound #16 Left Calcaneus: Change dressing every day. Follow-up Appointments: Wound #15 Left,Distal,Anterior Lower Leg: Return Appointment in 1 week. Wound #16 Left Calcaneus: Return Appointment in 1 week. Off-Loading: Wound #16 Left Calcaneus: Multipodus Splint - Therapy to provide patient with a multipodus splint for use while walking and for use once patient returns home I'm gonna suggest currently that we go ahead and initiate the above wound care measures and the patient is in agreement with that plan. We will subsequently see were things stand at follow-up. If anything changes in the interim I did advise the MIGNONNE, AFONSO. ([id number]) patient of what to look out for and to contact us ASAP. I do think she could benefit for me Multi Podus Boot. We discussed this and how it would benefit as well with this point this is something I would like for her to discuss with the physical therapist at the skilled nursing facility. It does sound like she will be discharged on Thursday. Please see above for specific wound care orders. We will see patient for re-evaluation in 1 week(s) here in the clinic. If anything worsens or changes patient will contact our office for additional recommendations. Electronic Signature(s) Signed: 03/21/2018 1:15:36 AM By: Worthy Keeler PA-C Entered By: Worthy Keeler on 03/21/2018 01:08:35 Keel, [name] ([id number]) -------------------------------------------------------------------------------- ROS/PFSH Details Patient Name: [name] Date of Service: 03/19/2018 10:30 AM Medical Record Number: [id number] Patient Account Number: [id number] Date of Birth/Sex: December 08, 1932 (85 y.o. F) Treating RN: Secundino Ginger Primary Care Provider: Emily Filbert Other Clinician: Referring Provider: Emily Filbert Treating Provider/Extender: Melburn Hake, HOYT Weeks in Treatment: 0 Label Progress Note Print Version as History and  Physical for this encounter Information Obtained From Patient Wound History Do you currently have one or more open woundso Yes How many open wounds do you currently haveo 1 Approximately how long have you had your woundso 2 weeks Have you had any lab work done in the past montho Yes Have you tested positive for osteomyelitis (bone infection)o No Have you had any tests for circulation on your legso No Eyes Complaints and Symptoms: Negative for: Dry Eyes; Vision Changes; Glasses / Contacts Medical History: Positive for: Cataracts Negative for: Glaucoma; Optic Neuritis Ear/Nose/Mouth/Throat Complaints and Symptoms: Negative for: Difficult clearing ears; Sinusitis Medical History: Negative for: Chronic sinus problems/congestion; Middle ear problems Past Medical History Notes: HOH- hearing aids Hematologic/Lymphatic Complaints and Symptoms: Negative for: Bleeding / Clotting Disorders; Human Immunodeficiency Virus Medical History: Positive for: Anemia Negative for: Hemophilia; Human Immunodeficiency Virus; Lymphedema; Sickle Cell Disease Respiratory Complaints and Symptoms: Negative for: Chronic or frequent coughs; Shortness of Breath Medical History: Negative for: Aspiration; Asthma; Chronic Obstructive Pulmonary Disease (COPD); Pneumothorax; Sleep Apnea; Tuberculosis Cardiovascular Mullally, TULEEN MANDELBAUM. ([id number]) Complaints and Symptoms: Negative for: Chest pain; LE edema Medical History: Positive for: Arrhythmia - A fib; Hypertension Negative for: Angina; Congestive Heart Failure; Coronary Artery Disease; Hypotension; Myocardial Infarction; Peripheral Arterial Disease; Peripheral Venous Disease; Phlebitis; Vasculitis Gastrointestinal Complaints and Symptoms: Positive for: Frequent diarrhea Negative for: Nausea; Vomiting Medical History: Negative for: Cirrhosis ; Colitis; Crohnos; Hepatitis A; Hepatitis B; Hepatitis C Endocrine Complaints and Symptoms: Negative for: Hepatitis;  Thyroid disease; Polydypsia (Excessive Thirst) Medical History: Negative for: Type I  Diabetes; Type II Diabetes Genitourinary Complaints and Symptoms: Negative for: Kidney failure/ Dialysis; Incontinence/dribbling Medical History: Negative for: End Stage Renal Disease Past Medical History Notes: frequent UTIs Immunological Complaints and Symptoms: Negative for: Hives; Itching Medical History: Negative for: Lupus Erythematosus; Raynaudos; Scleroderma Integumentary (Skin) Complaints and Symptoms: Positive for: Bleeding or bruising tendency Negative for: Wounds; Breakdown; Swelling Medical History: Negative for: History of Burn; History of pressure wounds Musculoskeletal Complaints and Symptoms: Negative for: Muscle Pain; Muscle Weakness Medical History: Positive for: Osteoarthritis Straker, Chisa A. ([id number]) Negative for: Gout; Rheumatoid Arthritis Neurologic Complaints and Symptoms: Negative for: Numbness/parasthesias; Focal/Weakness Medical History: Negative for: Dementia; Neuropathy; Quadriplegia; Paraplegia; Seizure Disorder Psychiatric Complaints and Symptoms: Negative for: Anxiety; Claustrophobia Medical History: Negative for: Anorexia/bulimia; Confinement Anxiety Oncologic Medical History: Positive for: Received Radiation Negative for: Received Chemotherapy Past Medical History Notes: Breast Ca- R mastectomy HBO Extended History Items Eyes: Cataracts Immunizations Pneumococcal Vaccine: Received Pneumococcal Vaccination: No Tetanus Vaccine: Last tetanus shot: 08/15/2008 Implantable Devices Hospitalization / Surgery History Name of Hospital Purpose of Hospitalization/Surgery Date [facility] (R) knee replacement 09/16/2014 Family and Social History Cancer: Yes - Siblings; Heart Disease: Yes - Father; Hereditary Spherocytosis: No; Hypertension: Yes - Siblings; Kidney Disease: No; Lung Disease: No; Seizures: No; Stroke: Yes - Mother; Thyroid Problems: No; Tuberculosis: No;  Never smoker; Marital Status - Married; Alcohol Use: Never; Drug Use: No History; Caffeine Use: Daily; Financial Concerns: No; Food, Clothing or Shelter Needs: No; Support System Lacking: No; Transportation Concerns: No; Advanced Directives: Yes (Not Provided); Patient does not want information on Advanced Directives; Do not resuscitate: No; Living Will: Yes (Not Provided); Medical Power of Attorney: Yes (Not Provided) Electronic Signature(s) Signed: 03/19/2018 4:16:42 PM By: Secundino Ginger Signed: 03/19/2018 5:21:38 PM By: Montey Hora Signed: 03/21/2018 1:15:36 AM By: Worthy Keeler PA-C Entered By: Montey Hora on 03/19/2018 11:45:05 Edick, [name] ([id number]) -------------------------------------------------------------------------------- [facility] Details Patient Name: [name] Date of Service: 03/19/2018 Medical Record Number: [id number] Patient Account Number: [id number] Date of Birth/Sex: 1933-01-04 (84 y.o. F) Treating RN: Montey Hora Primary Care Provider: Emily Filbert Other Clinician: Referring Provider: Emily Filbert Treating Provider/Extender: Melburn Hake, HOYT Weeks in Treatment: 0 Diagnosis Coding ICD-10 Codes Code Description 6267418709 Unspecified open wound, left lower leg, initial encounter L97.822 Non-pressure chronic ulcer of other part of left lower leg with fat layer exposed L89.626 Pressure-induced deep tissue damage of left heel I73.9 Peripheral vascular disease, unspecified I50.42 Chronic combined systolic (congestive) and diastolic (congestive) heart failure N18.4 Chronic kidney disease, stage 4 (severe) E08.144 Presence of left artificial knee joint Facility Procedures CPT4 Code: 81856314 Description: 97026 - WOUND CARE VISIT-LEV 3 EST PT Modifier: Quantity: 1 CPT4 Code: 37858850 Description: 27741 - DEBRIDE W/O ANES NON SELECT Modifier: Quantity: 1 Physician Procedures CPT4 Code Description: 2878676 99214 - WC PHYS LEVEL 4 - EST PT ICD-10 Diagnosis  Description S81.802A Unspecified open wound, left lower leg, initial encounter L97.822 Non-pressure chronic ulcer of other part of left lower leg wit L89.626  Pressure-induced deep tissue damage of left heel I73.9 Peripheral vascular disease, unspecified Modifier: h fat layer expos Quantity: 1 ed Electronic Signature(s) Signed: 03/21/2018 1:15:36 AM By: Worthy Keeler PA-C Previous Signature: 03/19/2018 1:35:57 PM Version By: Montey Hora Entered By: Worthy Keeler on 03/21/2018 01:08:52

## 2018-03-23 NOTE — Progress Notes (Signed)
ZIERRA, LAROQUE ([id number]) Visit Report for 03/19/2018 Allergy List Details Patient Name: [name], [name]. Date of Service: 03/19/2018 10:30 AM Medical Record Number: [id number] Patient Account Number: [id number] Date of Birth/Sex: 1932-10-23 (84 y.o. F) Treating RN: Secundino Ginger Primary Care Kaj Vasil: Emily Filbert Other Clinician: Referring Kweli Grassel: Emily Filbert Treating Tahtiana Rozier/Extender: Melburn Hake, HOYT Weeks in Treatment: 0 Allergies Active Allergies Lipitor Reaction: joint pain Severity: Moderate Penicillins Reaction: rash Severity: Moderate Vioxx Reaction: rash Cipro Reaction: nausea doxycycline Reaction: nausea, aching, itching, shaky Severity: Moderate Active: 02/23/2014 amlodipine Reaction: Nausea and vomiting Macrobid Reaction: rash Allergy Notes Electronic Signature(s) Signed: 03/19/2018 4:16:42 PM By: Secundino Ginger Entered By: Secundino Ginger on 03/19/2018 10:55:59 Teuscher, [name] ([id number]) -------------------------------------------------------------------------------- Arrival Information Details Patient Name: [name]. Date of Service: 03/19/2018 10:30 AM Medical Record Number: [id number] Patient Account Number: [id number] Date of Birth/Sex: December 16, 1932 (84 y.o. F) Treating RN: Secundino Ginger Primary Care Danielys Madry: Emily Filbert Other Clinician: Referring Teagen Bucio: Emily Filbert Treating Pinchus Weckwerth/Extender: Melburn Hake, HOYT Weeks in Treatment: 0 Visit Information Patient Arrived: [name] Arrival Time: 10:31 Accompanied By: husband Transfer Assistance: None Patient Identification Verified: Yes Secondary Verification Process Yes Completed: Patient Has Alerts: Yes Patient Alerts: Patient on Blood Thinner Eliquis ABI 05/23/17 L.88 R 1.16 History Since Last Visit Added or deleted any medications: No Any new allergies or adverse reactions: No Had a fall or experienced change in activities of daily living that may affect risk of falls: No Signs or symptoms of abuse/neglect since last  visito Yes Has Dressing in Place as Prescribed: Yes Notes hospitalized for sepsis April,2018. PT C/O 7/10 pain to wound . Electronic Signature(s) Signed: 03/19/2018 5:21:38 PM By: Montey Hora Entered By: Montey Hora on 03/19/2018 11:44:09 Krueger, [name] ([id number]) -------------------------------------------------------------------------------- Clinic Level of Care Assessment Details Patient Name: [name]. Date of Service: 03/19/2018 10:30 AM Medical Record Number: [id number] Patient Account Number: [id number] Date of Birth/Sex: Mar 07, 1933 (84 y.o. F) Treating RN: Montey Hora Primary Care Ozell Ferrera: Emily Filbert Other Clinician: Referring Lucee Brissett: Emily Filbert Treating Tameah Mihalko/Extender: Melburn Hake, HOYT Weeks in Treatment: 0 Clinic Level of Care Assessment Items TOOL 1 Quantity Score []  - Use when EandM and Procedure is performed on INITIAL visit 0 ASSESSMENTS - Nursing Assessment / Reassessment X - General Physical Exam (combine w/ comprehensive assessment (listed just below) when 1 20 performed on new pt. evals) X- 1 25 Comprehensive Assessment (HX, ROS, Risk Assessments, Wounds Hx, etc.) ASSESSMENTS - Wound and Skin Assessment / Reassessment []  - Dermatologic / Skin Assessment (not related to wound area) 0 ASSESSMENTS - Ostomy and/or Continence Assessment and Care []  - Incontinence Assessment and Management 0 []  - 0 Ostomy Care Assessment and Management (repouching, etc.) PROCESS - Coordination of Care X - Simple Patient / Family Education for ongoing care 1 15 []  - 0 Complex (extensive) Patient / Family Education for ongoing care X- 1 10 Staff obtains Programmer, systems, Records, Test Results / Process Orders []  - 0 Staff telephones HHA, Nursing Homes / Clarify orders / etc []  - 0 Routine Transfer to another Facility (non-emergent condition) []  - 0 Routine Hospital Admission (non-emergent condition) X- 1 15 New Admissions / Biomedical engineer / Ordering NPWT,  Apligraf, etc. []  - 0 Emergency Hospital Admission (emergent condition) PROCESS - Special Needs []  - Pediatric / Minor Patient Management 0 []  - 0 Isolation Patient Management []  - 0 Hearing / Language / Visual special needs []  - 0 Assessment of Community assistance (transportation, D/C planning, etc.) []  - 0 Additional assistance /  Altered mentation []  - 0 Support Surface(s) Assessment (bed, cushion, seat, etc.) Vellucci, Dashea A. ([id number]) INTERVENTIONS - Miscellaneous []  - External ear exam 0 []  - 0 Patient Transfer (multiple staff / Civil Service fast streamer / Similar devices) []  - 0 Simple Staple / Suture removal (25 or less) []  - 0 Complex Staple / Suture removal (26 or more) []  - 0 Hypo/Hyperglycemic Management (do not check if billed separately) []  - 0 Ankle / Brachial Index (ABI) - do not check if billed separately Has the patient been seen at the hospital within the last three years: Yes Total Score: 85 Level Of Care: ____ Electronic Signature(s) Signed: 03/19/2018 5:21:38 PM By: Montey Hora Entered By: Montey Hora on 03/19/2018 13:35:48 Farquhar, [name] ([id number]) -------------------------------------------------------------------------------- Lower Extremity Assessment Details Patient Name: [name]. Date of Service: 03/19/2018 10:30 AM Medical Record Number: [id number] Patient Account Number: [id number] Date of Birth/Sex: [birth year] (84 y.o. F) Treating RN: Secundino Ginger Primary Care Vernia Teem: Emily Filbert Other Clinician: Referring Macgregor Aeschliman: Emily Filbert Treating Shimshon Narula/Extender: Melburn Hake, HOYT Weeks in Treatment: 0 Edema Assessment Assessed: [Left: No] [Right: No] [Left: Edema] [Right: :] Calf Left: Right: Point of Measurement: 30 cm From Medial Instep 27 cm 29 cm Ankle Left: Right: Point of Measurement: 10 cm From Medial Instep 18 cm 19 cm Vascular Assessment Claudication: Claudication Assessment [Left:None] [Right:None] Pulses: Dorsalis Pedis Palpable:  [Left:Yes] [Right:Yes] Posterior Tibial Extremity colors, hair growth, and conditions: Extremity Color: [Left:Hyperpigmented] [Right:Hyperpigmented] Hair Growth on Extremity: [Left:No] [Right:No] Temperature of Extremity: [Left:Warm] [Right:Warm] Capillary Refill: [Left:< 3 seconds] [Right:< 3 seconds] Toe Nail Assessment Left: Right: Thick: No No Discolored: No No Deformed: No No Improper Length and Hygiene: No No Electronic Signature(s) Signed: 03/19/2018 4:16:42 PM By: Secundino Ginger Entered By: Secundino Ginger on 03/19/2018 11:07:00 Markell, [name] ([id number]) -------------------------------------------------------------------------------- Multi Wound Chart Details Patient Name: [name]. Date of Service: 03/19/2018 10:30 AM Medical Record Number: [id number] Patient Account Number: [id number] Date of Birth/Sex: [birth year] (84 y.o. F) Treating RN: Montey Hora Primary Care Hanz Winterhalter: Emily Filbert Other Clinician: Referring Reginna Sermeno: Emily Filbert Treating Osie Merkin/Extender: Melburn Hake, HOYT Weeks in Treatment: 0 Vital Signs Height(in): 68 Pulse(bpm): 72 Weight(lbs): 121 Blood Pressure(mmHg): 95/64 Body Mass Index(BMI): 18 Temperature(F): 98.1 Respiratory Rate 18 (breaths/min): Photos: [15:No Photos] [16:No Photos] [N/A:N/A] Wound Location: [15:Left Lower Leg - Anterior, Distal] [16:Left Calcaneus] [N/A:N/A] Wounding Event: [15:Shear/Friction] [16:Pressure Injury] [N/A:N/A] Primary Etiology: [15:Skin Tear] [16:Pressure Ulcer] [N/A:N/A] Comorbid History: [15:Cataracts, Anemia, Arrhythmia, Hypertension, Osteoarthritis, Received Radiation] [16:Cataracts, Anemia, Arrhythmia, Hypertension, Osteoarthritis, Received Radiation] [N/A:N/A] Date Acquired: [15:03/05/2018] [16:03/11/2018] [N/A:N/A] Weeks of Treatment: [15:0] [16:0] [N/A:N/A] Wound Status: [15:Open] [16:Open] [N/A:N/A] Measurements L x W x D [15:4.7x4x0.2] [16:1.5x0.4x0.1] [N/A:N/A] (cm) Area (cm) : [24:40.102] [16:0.471]  [N/A:N/A] Volume (cm) : [15:2.953] [16:0.047] [N/A:N/A] % Reduction in Area: [15:0.00%] [16:N/A] [N/A:N/A] % Reduction in Volume: [15:0.00%] [16:N/A] [N/A:N/A] Classification: [15:Full Thickness Without Exposed Support Structures] [16:Unstageable/Unclassified] [N/A:N/A] Exudate Amount: [15:Small] [16:Medium] [N/A:N/A] Exudate Type: [15:Serous] [16:Serous] [N/A:N/A] Exudate Color: [15:amber] [16:amber] [N/A:N/A] Wound Margin: [15:N/A] [16:Indistinct, nonvisible] [N/A:N/A] Granulation Amount: [15:None Present (0%)] [16:Small (1-33%)] [N/A:N/A] Granulation Quality: [15:N/A] [16:Pink] [N/A:N/A] Necrotic Amount: [15:Large (67-100%)] [16:Medium (34-66%)] [N/A:N/A] Exposed Structures: [15:Fat Layer (Subcutaneous Tissue) Exposed: Yes Fascia: No Tendon: No Muscle: No Joint: No Bone: No] [16:Fat Layer (Subcutaneous Tissue) Exposed: Yes Fascia: No Tendon: No Muscle: No Joint: No Bone: No] [N/A:N/A] Epithelialization: [15:N/A] [16:None] [N/A:N/A] Periwound Skin Texture: [15:Excoriation: No Induration: No] [16:Excoriation: No Induration: No] [N/A:N/A] Callus: No Callus: No Crepitus: No Crepitus: No Rash: No Rash: No Scarring: No Scarring:  No Periwound Skin Moisture: Maceration: No Maceration: No N/A Dry/Scaly: No Dry/Scaly: No Periwound Skin Color: Erythema: Yes Atrophie Blanche: No N/A Atrophie Blanche: No Cyanosis: No Cyanosis: No Ecchymosis: No Ecchymosis: No Erythema: No Hemosiderin Staining: No Hemosiderin Staining: No Mottled: No Mottled: No Pallor: No Pallor: No Rubor: No Rubor: No Erythema Location: Circumferential N/A N/A Temperature: N/A No Abnormality N/A Tenderness on Palpation: No Yes N/A Wound Preparation: Ulcer Cleansing: Ulcer Cleansing: N/A Rinsed/Irrigated with Saline Rinsed/Irrigated with Saline Topical Anesthetic Applied: Topical Anesthetic Applied: Other: lidocaine 4% None Assessment Notes: N/A open wound is surrounded by N/A DTI 7.5 x 6.3 x  .1 Treatment Notes Electronic Signature(s) Signed: 03/19/2018 5:21:38 PM By: Montey Hora Entered By: Montey Hora on 03/19/2018 11:29:09 Omdahl, [name] ([id number]) -------------------------------------------------------------------------------- [place] Details Patient Name: [name]. Date of Service: 03/19/2018 10:30 AM Medical Record Number: [id number] Patient Account Number: [id number] Date of Birth/Sex: [birth year] (84 y.o. F) Treating RN: Montey Hora Primary Care Kace Hartje: Emily Filbert Other Clinician: Referring Brycin Kille: Emily Filbert Treating Careen Mauch/Extender: Melburn Hake, HOYT Weeks in Treatment: 0 Active Inactive Abuse / Safety / Falls / Self Care Management Nursing Diagnoses: Potential for falls Goals: Patient will not develop complications from immobility Date Initiated: 03/19/2018 Target Resolution Date: 06/22/2018 Goal Status: Active Interventions: Assess fall risk on admission and as needed Notes: Nutrition Nursing Diagnoses: Potential for alteratiion in Nutrition/Potential for imbalanced nutrition Goals: Patient/caregiver agrees to and verbalizes understanding of need to use nutritional supplements and/or vitamins as prescribed Date Initiated: 03/19/2018 Target Resolution Date: 06/22/2018 Goal Status: Active Interventions: Assess patient nutrition upon admission and as needed per policy Notes: Orientation to the Wound Care Program Nursing Diagnoses: Knowledge deficit related to the wound healing center program Goals: Patient/caregiver will verbalize understanding of the [place] Program Date Initiated: 03/19/2018 Target Resolution Date: 06/22/2018 Goal Status: Active Interventions: Provide education on orientation to the wound center JOHANNAH, ROZAS. ([id number]) Notes: Pressure Nursing Diagnoses: Potential for impaired tissue integrity related to pressure, friction, moisture, and shear Goals: Patient will remain free from  development of additional pressure ulcers Date Initiated: 03/19/2018 Target Resolution Date: 06/22/2018 Goal Status: Active Interventions: Assess potential for pressure ulcer upon admission and as needed Provide education on pressure ulcers Notes: Wound/Skin Impairment Nursing Diagnoses: Impaired tissue integrity Goals: Ulcer/skin breakdown will heal within 14 weeks Date Initiated: 03/19/2018 Target Resolution Date: 06/22/2018 Goal Status: Active Interventions: Assess patient/caregiver ability to obtain necessary supplies Assess patient/caregiver ability to perform ulcer/skin care regimen upon admission and as needed Assess ulceration(s) every visit Notes: Electronic Signature(s) Signed: 03/19/2018 5:21:38 PM By: Montey Hora Entered By: Montey Hora on 03/19/2018 11:24:31 Dimitroff, [name] ([id number]) -------------------------------------------------------------------------------- Non-Wound Condition Assessment Details Patient Name: [name]. Date of Service: 03/19/2018 10:30 AM Medical Record Number: [id number] Patient Account Number: [id number] Date of Birth/Sex: [birth year] (84 y.o. F) Treating RN: Secundino Ginger Primary Care Omarion Minnehan: Emily Filbert Other Clinician: Referring Cope Marte: Emily Filbert Treating Devony Mcgrady/Extender: STONE III, HOYT Weeks in Treatment: 0 Non-Wound Condition: Condition: Suspected Deep Tissue Injury Location: Foot Side: Left Periwound Skin Texture Texture Color No Abnormalities Noted: No No Abnormalities Noted: No Callus: No Atrophie Blanche: No Crepitus: No Cyanosis: No Excoriation: No Ecchymosis: No Friable: No Erythema: No Induration: No Hemosiderin Staining: No Rash: No Mottled: No Scarring: No Pallor: No Rubor: No Moisture No Abnormalities Noted: No Dry / Scaly: No Maceration: No Electronic Signature(s) Signed: 03/19/2018 4:16:42 PM By: Secundino Ginger Entered By: Secundino Ginger on 03/19/2018 11:02:42 Wannamaker, [name]  ([id number]) --------------------------------------------------------------------------------  Pain Assessment Details Patient Name: CONGETTA, ODRISCOLL. Date of Service: 03/19/2018 10:30 AM Medical Record Number: [id number] Patient Account Number: [id number] Date of Birth/Sex: Apr 21, 1932 (84 y.o. F) Treating RN: Secundino Ginger Primary Care Philena Obey: Emily Filbert Other Clinician: Referring Aubrei Bouchie: Emily Filbert Treating Azaan Leask/Extender: Melburn Hake, HOYT Weeks in Treatment: 0 Active Problems Location of Pain Severity and Description of Pain Patient Has Paino Yes Site Locations Pain Location: Pain in Ulcers Pain Management and Medication Current Pain Management: Goals for Pain Management 7/10 pain controlled by pain meds at rehab . Electronic Signature(s) Signed: 03/19/2018 4:16:42 PM By: Secundino Ginger Entered By: Secundino Ginger on 03/19/2018 10:37:06 Fifita, [name] ([id number]) -------------------------------------------------------------------------------- Patient/Caregiver Education Details Patient Name: [name] Date of Service: 03/19/2018 10:30 AM Medical Record Number: [id number] Patient Account Number: [id number] Date of Birth/Gender: 06-25-1932 (84 y.o. F) Treating RN: Montey Hora Primary Care Physician: Emily Filbert Other Clinician: Referring Physician: Emily Filbert Treating Physician/Extender: Melburn Hake, HOYT Weeks in Treatment: 0 Education Assessment Education Provided To: Patient and Caregiver SNF nurses via written orders and spouse Education Topics Provided Offloading: Handouts: Other: offloading heel Methods: Explain/Verbal Responses: State content correctly Wound Debridement: Handouts: Other: using santyl Methods: Explain/Verbal Responses: State content correctly Wound/Skin Impairment: Handouts: Other: wound care orders Methods: Demonstration, Explain/Verbal, Printed Responses: State content correctly Electronic Signature(s) Signed: 03/19/2018 5:21:38 PM By: Montey Hora Entered By: Montey Hora on 03/19/2018 11:38:40 Mcmichen, [name] ([id number]) -------------------------------------------------------------------------------- Wound Assessment Details Patient Name: [name]. Date of Service: 03/19/2018 10:30 AM Medical Record Number: [id number] Patient Account Number: [id number] Date of Birth/Sex: 08/16/1932 (84 y.o. F) Treating RN: Secundino Ginger Primary Care Enez Monahan: Emily Filbert Other Clinician: Referring Louetta Hollingshead: Emily Filbert Treating Leighanne Adolph/Extender: Melburn Hake, HOYT Weeks in Treatment: 0 Wound Status Wound Number: 15 Primary Skin Tear Etiology: Wound Location: Left Lower Leg - Anterior, Distal Wound Open Wounding Event: Shear/Friction Status: Date Acquired: 03/05/2018 Comorbid Cataracts, Anemia, Arrhythmia, Hypertension, Weeks Of Treatment: 0 History: Osteoarthritis, Received Radiation Clustered Wound: No Photos Photo Uploaded By: Secundino Ginger on 03/19/2018 11:50:06 Wound Measurements Length: (cm) 4.7 Width: (cm) 4 Depth: (cm) 0.2 Area: (cm) 14.765 Volume: (cm) 2.953 % Reduction in Area: 0% % Reduction in Volume: 0% Tunneling: No Undermining: No Wound Description Full Thickness Without Exposed Support Foul Classification: Structures Slou Exudate Small Amount: Exudate Type: Serous Exudate Color: amber Odor After Cleansing: No gh/Fibrino Yes Wound Bed Granulation Amount: None Present (0%) Exposed Structure Necrotic Amount: Large (67-100%) Fascia Exposed: No Necrotic Quality: Adherent Slough Fat Layer (Subcutaneous Tissue) Exposed: Yes Tendon Exposed: No Muscle Exposed: No Joint Exposed: No Bone Exposed: No Periwound Skin Texture Byrum, Umaiza A. ([id number]) Texture Color No Abnormalities Noted: No No Abnormalities Noted: No Callus: No Atrophie Blanche: No Crepitus: No Cyanosis: No Excoriation: No Ecchymosis: No Induration: No Erythema: Yes Rash: No Erythema Location: Circumferential Scarring: No Hemosiderin  Staining: No Mottled: No Moisture Pallor: No No Abnormalities Noted: No Rubor: No Dry / Scaly: No Maceration: No Wound Preparation Ulcer Cleansing: Rinsed/Irrigated with Saline Topical Anesthetic Applied: Other: lidocaine 4%, Electronic Signature(s) Signed: 03/19/2018 4:16:42 PM By: Secundino Ginger Entered By: Secundino Ginger on 03/19/2018 11:00:53 Bossier, [name] ([id number]) -------------------------------------------------------------------------------- Wound Assessment Details Patient Name: [name]. Date of Service: 03/19/2018 10:30 AM Medical Record Number: [id number] Patient Account Number: [id number] Date of Birth/Sex: 11-Jun-1932 (84 y.o. F) Treating RN: Montey Hora Primary Care Amauri Keefe: Emily Filbert Other Clinician: Referring Jury Caserta: Emily Filbert Treating Lajuan Kovaleski/Extender: Melburn Hake, HOYT Weeks in Treatment: 0 Wound Status Wound Number: 16 Primary  Pressure Ulcer Etiology: Wound Location: Left Calcaneus Wound Open Wounding Event: Pressure Injury Status: Date Acquired: 03/11/2018 Comorbid Cataracts, Anemia, Arrhythmia, Hypertension, Weeks Of Treatment: 0 History: Osteoarthritis, Received Radiation Clustered Wound: No Photos Photo Uploaded By: Secundino Ginger on 03/19/2018 11:52:43 Wound Measurements Length: (cm) 1.5 % Reduction Width: (cm) 0.4 % Reduction Depth: (cm) 0.1 Epitheliali Area: (cm) 0.471 Tunneling: Volume: (cm) 0.047 Underminin in Area: in Volume: zation: None No g: No Wound Description Classification: Unstageable/Unclassified Wound Margin: Indistinct, nonvisible Exudate Amount: Medium Exudate Type: Serous Exudate Color: amber Foul Odor After Cleansing: No Slough/Fibrino Yes Wound Bed Granulation Amount: Small (1-33%) Exposed Structure Granulation Quality: Pink Fascia Exposed: No Necrotic Amount: Medium (34-66%) Fat Layer (Subcutaneous Tissue) Exposed: Yes Necrotic Quality: Adherent Slough Tendon Exposed: No Muscle Exposed: No Joint Exposed:  No Bone Exposed: No Periwound Skin Texture Gruen, Harmani A. ([id number]) Texture Color No Abnormalities Noted: No No Abnormalities Noted: No Callus: No Atrophie Blanche: No Crepitus: No Cyanosis: No Excoriation: No Ecchymosis: No Induration: No Erythema: No Rash: No Hemosiderin Staining: No Scarring: No Mottled: No Pallor: No Moisture Rubor: No No Abnormalities Noted: No Dry / Scaly: No Temperature / Pain Maceration: No Temperature: No Abnormality Tenderness on Palpation: Yes Wound Preparation Ulcer Cleansing: Rinsed/Irrigated with Saline Topical Anesthetic Applied: None Assessment Notes open wound is surrounded by DTI 7.5 x 6.3 x .1 Electronic Signature(s) Signed: 03/19/2018 5:21:38 PM By: Montey Hora Entered By: Montey Hora on 03/19/2018 11:28:58 Qadir, [name] ([id number]) -------------------------------------------------------------------------------- Vitals Details Patient Name: [name]. Date of Service: 03/19/2018 10:30 AM Medical Record Number: [id number] Patient Account Number: [id number] Date of Birth/Sex: 15-Oct-1932 (84 y.o. F) Treating RN: Secundino Ginger Primary Care Zehava Turski: Emily Filbert Other Clinician: Referring Jillien Yakel: Emily Filbert Treating Amarachi Kotz/Extender: Melburn Hake, HOYT Weeks in Treatment: 0 Vital Signs Time Taken: 10:30 Temperature (F): 98.1 Height (in): 68 Pulse (bpm): 72 Source: Stated Respiratory Rate (breaths/min): 18 Weight (lbs): 121 Blood Pressure (mmHg): 95/64 Source: Stated Reference Range: 80 - 120 mg / dl Body Mass Index (BMI): 18.4 Electronic Signature(s) Signed: 03/19/2018 4:16:42 PM By: Secundino Ginger Entered BySecundino Ginger on 03/19/2018 10:38:32

## 2018-03-26 ENCOUNTER — Ambulatory Visit: Payer: Medicare HMO | Admitting: Physician Assistant

## 2018-03-28 ENCOUNTER — Encounter: Payer: Medicare HMO | Admitting: Physician Assistant

## 2018-03-28 DIAGNOSIS — L89626 Pressure-induced deep tissue damage of left heel: Secondary | ICD-10-CM | POA: Diagnosis not present

## 2018-04-01 NOTE — Progress Notes (Signed)
QUYEN, CUTSFORTH ([id number]) Visit Report for 03/28/2018 Chief Complaint Document Details Patient Name: [name], [name]. Date of Service: 03/28/2018 12:45 PM Medical Record Number: [id number] Patient Account Number: [id number] Date of Birth/Sex: 1933/03/05 (85 y.o. F) Treating RN: Cornell Barman Primary Care Provider: Emily Filbert Other Clinician: Referring Provider: Emily Filbert Treating Provider/Extender: Melburn Hake, HOYT Weeks in Treatment: 1 Information Obtained from: Patient Chief Complaint Left heel DTI and left LE ulcer Electronic Signature(s) Signed: 03/29/2018 8:01:46 PM By: Worthy Keeler PA-C Entered By: Worthy Keeler on 03/28/2018 13:04:29 Holsapple, [name] ([id number]) -------------------------------------------------------------------------------- Debridement Details Patient Name: [name]. Date of Service: 03/28/2018 12:45 PM Medical Record Number: [id number] Patient Account Number: [id number] Date of Birth/Sex: 05-Apr-1933 (85 y.o. F) Treating RN: Montey Hora Primary Care Provider: Emily Filbert Other Clinician: Referring Provider: Emily Filbert Treating Provider/Extender: Melburn Hake, HOYT Weeks in Treatment: 1 Debridement Performed for Wound #16 Left Calcaneus Assessment: Performed By: Physician STONE III, HOYT E., PA-C Debridement Type: Debridement Level of Consciousness (Pre- Awake and Alert procedure): Pre-procedure Verification/Time Yes - 13:32 Out Taken: Start Time: 13:32 Pain Control: Lidocaine 4% Topical Solution Total Area Debrided (L x W): 1.5 (cm) x 2.5 (cm) = 3.75 (cm) Tissue and other material Non-Viable, Skin: Dermis , Skin: Epidermis debrided: Level: Skin/Epidermis Debridement Description: Selective/Open Wound Instrument: Forceps, Scissors Bleeding: None End Time: 13:37 Procedural Pain: 0 Post Procedural Pain: 0 Response to Treatment: Procedure was tolerated well Level of Consciousness Awake and Alert (Post-procedure): Post Debridement  Measurements of Total Wound Length: (cm) 1.5 Stage: Category/Stage III Width: (cm) 2.5 Depth: (cm) 0.1 Volume: (cm) 0.295 Character of Wound/Ulcer Post Improved Debridement: Post Procedure Diagnosis Same as Pre-procedure Electronic Signature(s) Signed: 03/29/2018 5:23:14 PM By: Montey Hora Signed: 03/29/2018 8:01:46 PM By: Worthy Keeler PA-C Entered By: Montey Hora on 03/28/2018 13:41:05 [place], [name] ([id number]) -------------------------------------------------------------------------------- HPI Details Patient Name: [name]. Date of Service: 03/28/2018 12:45 PM Medical Record Number: [id number] Patient Account Number: [id number] Date of Birth/Sex: 1932/11/23 (85 y.o. F) Treating RN: Cornell Barman Primary Care Provider: Emily Filbert Other Clinician: Referring Provider: Emily Filbert Treating Provider/Extender: Melburn Hake, HOYT Weeks in Treatment: 1 History of Present Illness HPI Description: 84 year old patient who is known to the wound center for several years now has a new injury to the right lower extremity where she bumped herself against the bath tub. He also has had a chronic right lateral malleolus open wound which has been coming on and off for several years now. This has been there for at least 2014 and was healed out in November 2016. In the past she has been seen by her cardiologist Dr. Kathlyn Sacramento in April 2016. Noninvasive vascular evaluation showed an ABI of 0.68 on the right and 0.85 on the left. 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 One-vessel runoff below the knee via the peroneal artery with short occlusion of the anterior tibial artery and reconstitution. No intervention performed. Most recently she was seen for a wound on her right lower extremity which was healed out by March 15 She is not a smoker and has had no diabetes mellitus. 07/29/2015 -- the appointment with her  cardiologist Dr. Fletcher Anon is still pending for review of her arterial duplex studies. Readmission: 03/19/18 on evaluation today patient presents for readmission concerning an issue she has been having with her left heel and left anterior lower extremity. She has a history of atrial fibrillation and had knee surgery  for a total knee replacement three weeks ago today. Subsequently since being discharged from the hospital it was noted that she did have a left heel deep tissue injury as well the left anterior lower extremity ulcer. This has been present apparently for roughly 2 weeks. She does not appear to have any evidence of infection which is good news. She has been tolerating the dressing changes that have been performed it sounds as if Santyl is what has been used at least most recently. Obviously I think this is likely a good treatment choice for her. Especially in light of the fact that the wound on the distal lower extremity of the left leg appears to show signs of significant slough/eschar buildup which is starting to loosen up now that she is using the central but prior was fairly significant as far as how hard and black it was. She does have pain at both sites the open wound more than the deep tissue injury. Otherwise the patient does have a history of peripheral vascular disease, congestive heart failure, chronic kidney disease stage IV, and now the presence of a left artificial knee joint. 03/28/18 on evaluation today patient appears to be doing a little worse in regard to the dorsal surface of her lower extremity. She has been tolerating the dressing changes without complication although she states there's a lot of pain at this location. Subsequently in regard to her left heel the show signs of continued to drain with maceration noted. I think that some of the dead tissue needs to be removed from the surface of the wound at the site to allow it to heal appropriately. Electronic  Signature(s) Signed: 03/29/2018 8:01:46 PM By: Worthy Keeler PA-C Entered By: Worthy Keeler on 03/28/2018 14:15:47 Navratil, [name] ([id number]) -------------------------------------------------------------------------------- Physical Exam Details Patient Name: [name]. Date of Service: 03/28/2018 12:45 PM Medical Record Number: [id number] Patient Account Number: [id number] Date of Birth/Sex: 03/10/1933 (85 y.o. F) Treating RN: Cornell Barman Primary Care Provider: Emily Filbert Other Clinician: Referring Provider: Emily Filbert Treating Provider/Extender: Melburn Hake, HOYT Weeks in Treatment: 1 Constitutional Well-nourished and well-hydrated in no acute distress. Respiratory normal breathing without difficulty. Psychiatric this patient is able to make decisions and demonstrates good insight into disease process. Alert and Oriented x 3. pleasant and cooperative. Notes Sharp debridement was performed utilizing scissors and forceps to remove the necrotic blistered tissue from the field. I did remove everything that was loose from the site and post debridement the hill actually appears to be doing significantly better which is great news. There does not appear to show any signs of infection at this time which is good news. In regard to the lower anterior lower extremity on the left this actually appears to show some signs of potential infection. I am concerned in this regard about things worsening.. There is erythema surrounding the wound bed. Electronic Signature(s) Signed: 03/29/2018 8:01:46 PM By: Worthy Keeler PA-C Entered By: Worthy Keeler on 03/28/2018 14:16:54 Mcdevitt, [name] ([id number]) -------------------------------------------------------------------------------- Physician Orders Details Patient Name: [name] Date of Service: 03/28/2018 12:45 PM Medical Record Number: [id number] Patient Account Number: [id number] Date of Birth/Sex: [birth year] (85 y.o. F) Treating RN: Montey Hora Primary Care Provider: Emily Filbert Other Clinician: Referring Provider: Emily Filbert Treating Provider/Extender: Melburn Hake, HOYT Weeks in Treatment: 1 Verbal / Phone Orders: No Diagnosis Coding ICD-10 Coding Code Description [phone] Unspecified open wound, left lower leg, initial encounter L97.822 Non-pressure chronic ulcer of other part of left  lower leg with fat layer exposed L89.626 Pressure-induced deep tissue damage of left heel I73.9 Peripheral vascular disease, unspecified I50.42 Chronic combined systolic (congestive) and diastolic (congestive) heart failure N18.4 Chronic kidney disease, stage 4 (severe) P54.656 Presence of left artificial knee joint Wound Cleansing Wound #15 Left,Distal,Anterior Lower Leg o Cleanse wound with mild soap and water Wound #16 Left Calcaneus o Cleanse wound with mild soap and water Primary Wound Dressing Wound #15 Left,Distal,Anterior Lower Leg o Santyl Ointment Wound #16 Left Calcaneus o Silver Alginate Secondary Dressing Wound #15 Left,Distal,Anterior Lower Leg o Gauze, ABD and Kerlix/Conform - secure with netting Wound #16 Left Calcaneus o Gauze, ABD and Kerlix/Conform - secure with netting Dressing Change Frequency Wound #15 Left,Distal,Anterior Lower Leg o Change dressing every day. Wound #16 Left Calcaneus o Change dressing every day. Follow-up Appointments Wound #15 Left,Distal,Anterior Lower Leg o Return Appointment in 1 week. JACQULINE, TERRILL ([id number]) Wound #16 Left Calcaneus o Return Appointment in 1 week. Off-Loading Wound #16 Left Calcaneus o Other: - Please continue wearing your boot to protect your heel Patient Medications Allergies: Lipitor, Penicillins, doxycycline, Vioxx, Cipro, amlodipine, Macrobid Notifications Medication Indication Start End Keflex 03/28/2018 DOSE 1 - oral 750 mg capsule - 1 capsule oral taken 3 times a day for 10 days Electronic Signature(s) Signed: 03/28/2018  1:49:57 PM By: Worthy Keeler PA-C Entered By: Worthy Keeler on 03/28/2018 13:49:56 Maines, [name] ([id number]) -------------------------------------------------------------------------------- Problem List Details Patient Name: [name]. Date of Service: 03/28/2018 12:45 PM Medical Record Number: [id number] Patient Account Number: [id number] Date of Birth/Sex: 1933/02/26 (85 y.o. F) Treating RN: Cornell Barman Primary Care Provider: Emily Filbert Other Clinician: Referring Provider: Emily Filbert Treating Provider/Extender: Melburn Hake, HOYT Weeks in Treatment: 1 Active Problems ICD-10 Evaluated Encounter Code Description Active Date Today Diagnosis S81.802A Unspecified open wound, left lower leg, initial encounter 03/19/2018 No Yes L97.822 Non-pressure chronic ulcer of other part of left lower leg with 03/19/2018 No Yes fat layer exposed L89.626 Pressure-induced deep tissue damage of left heel 03/19/2018 No Yes I73.9 Peripheral vascular disease, unspecified 03/19/2018 No Yes I50.42 Chronic combined systolic (congestive) and diastolic 59/12/3568 No Yes (congestive) heart failure N18.4 Chronic kidney disease, stage 4 (severe) 03/19/2018 No Yes Z96.652 Presence of left artificial knee joint 03/19/2018 No Yes Inactive Problems Resolved Problems Electronic Signature(s) Signed: 03/29/2018 8:01:46 PM By: Worthy Keeler PA-C Entered By: Worthy Keeler on 03/28/2018 13:04:24 Moskowitz, [name] ([id number]) -------------------------------------------------------------------------------- Progress Note/History and Physical Details Patient Name: [name] Date of Service: 03/28/2018 12:45 PM Medical Record Number: [id number] Patient Account Number: [id number] Date of Birth/Sex: May 28, 1932 (85 y.o. F) Treating RN: Cornell Barman Primary Care Provider: Emily Filbert Other Clinician: Referring Provider: Emily Filbert Treating Provider/Extender: Melburn Hake, HOYT Weeks in Treatment: 1 Subjective Chief  Complaint Information obtained from Patient Left heel DTI and left LE ulcer History of Present Illness (HPI) 84 year old patient who is known to the wound center for several years now has a new injury to the right lower extremity where she bumped herself against the bath tub. He also has had a chronic right lateral malleolus open wound which has been coming on and off for several years now. This has been there for at least 2014 and was healed out in November 2016. In the past she has been seen by her cardiologist Dr. Kathlyn Sacramento in April 2016. Noninvasive vascular evaluation showed an ABI of 0.68 on the right and 0.85 on the left. Angiography in May 2016 showed moderate nonobstructive disease  affecting the distal right SFA and popliteal artery. One-vessel runoff below the knee via the peroneal artery with short occlusion of the anterior tibial artery and reconstitution. No intervention performed. Most recently she was seen for a wound on her right lower extremity which was healed out by March 15 She is not a smoker and has had no diabetes mellitus. 07/29/2015 -- the appointment with her cardiologist Dr. Fletcher Anon is still pending for review of her arterial duplex studies. Readmission: 03/19/18 on evaluation today patient presents for readmission concerning an issue she has been having with her left heel and left anterior lower extremity. She has a history of atrial fibrillation and had knee surgery for a total knee replacement three weeks ago today. Subsequently since being discharged from the hospital it was noted that she did have a left heel deep tissue injury as well the left anterior lower extremity ulcer. This has been present apparently for roughly 2 weeks. She does not appear to have any evidence of infection which is good news. She has been tolerating the dressing changes that have been performed it sounds as if Santyl is what has been used at least most recently. Obviously I think this is  likely a good treatment choice for her. Especially in light of the fact that the wound on the distal lower extremity of the left leg appears to show signs of significant slough/eschar buildup which is starting to loosen up now that she is using the central but prior was fairly significant as far as how hard and black it was. She does have pain at both sites the open wound more than the deep tissue injury. Otherwise the patient does have a history of peripheral vascular disease, congestive heart failure, chronic kidney disease stage IV, and now the presence of a left artificial knee joint. 03/28/18 on evaluation today patient appears to be doing a little worse in regard to the dorsal surface of her lower extremity. She has been tolerating the dressing changes without complication although she states there's a lot of pain at this location. Subsequently in regard to her left heel the show signs of continued to drain with maceration noted. I think that some of the dead tissue needs to be removed from the surface of the wound at the site to allow it to heal appropriately. Wound History Patient presents with 1 open wound that has been present for approximately 2 weeks. Laboratory tests have been performed in the last month. Patient reportedly has tested positive for an antibiotic resistant organism. Patient reportedly has not tested positive for osteomyelitis. Patient reportedly has not had testing performed to evaluate circulation in the legs. Patient History Information obtained from Patient. Family History TIGERLILY, CHRISTINE ([id number]) Cancer - Siblings, Heart Disease - Father, Hypertension - Siblings, Stroke - Mother, No family history of Hereditary Spherocytosis, Kidney Disease, Lung Disease, Seizures, Thyroid Problems, Tuberculosis. Social History Never smoker, Marital Status - Married, Alcohol Use - Never, Drug Use - No History, Caffeine Use - Daily. Medical History Eyes Patient has history of  Cataracts Denies history of Glaucoma, Optic Neuritis Ear/Nose/Mouth/Throat Denies history of Chronic sinus problems/congestion, Middle ear problems Hematologic/Lymphatic Patient has history of Anemia Denies history of Hemophilia, Human Immunodeficiency Virus, Lymphedema, Sickle Cell Disease Respiratory Denies history of Aspiration, Asthma, Chronic Obstructive Pulmonary Disease (COPD), Pneumothorax, Sleep Apnea, Tuberculosis Cardiovascular Patient has history of Arrhythmia - A fib, Hypertension Denies history of Angina, Congestive Heart Failure, Coronary Artery Disease, Hypotension, Myocardial Infarction, Peripheral Arterial Disease, Peripheral Venous Disease,  Phlebitis, Vasculitis Gastrointestinal Denies history of Cirrhosis , Colitis, Crohn s, Hepatitis A, Hepatitis B, Hepatitis C Endocrine Denies history of Type I Diabetes, Type II Diabetes Genitourinary Denies history of End Stage Renal Disease Immunological Denies history of Lupus Erythematosus, Raynaud s, Scleroderma Integumentary (Skin) Denies history of History of Burn, History of pressure wounds Musculoskeletal Patient has history of Osteoarthritis Denies history of Gout, Rheumatoid Arthritis Neurologic Denies history of Dementia, Neuropathy, Quadriplegia, Paraplegia, Seizure Disorder Oncologic Patient has history of Received Radiation Denies history of Received Chemotherapy Psychiatric Denies history of Anorexia/bulimia, Confinement Anxiety Hospitalization/Surgery History - 09/16/2014, [facility], (R) knee replacement. Medical And Surgical History Notes Ear/Nose/Mouth/Throat HOH- hearing aids Genitourinary frequent UTIs Oncologic Breast Ca- R mastectomy Review of Systems (ROS) Constitutional Symptoms (General Health) Denies complaints or symptoms of Fever, Chills. Respiratory The patient has no complaints or symptoms. Cardiovascular The patient has no complaints or symptoms. LATEEFA, CROSBY ([id number]) Psychiatric The  patient has no complaints or symptoms. Objective Constitutional Well-nourished and well-hydrated in no acute distress. Vitals Time Taken: 1:00 PM, Height: 68 in, Weight: 121 lbs, BMI: 18.4, Temperature: 97.6 F, Pulse: 70 bpm, Respiratory Rate: 16 breaths/min, Blood Pressure: 108/37 mmHg. Respiratory normal breathing without difficulty. Psychiatric this patient is able to make decisions and demonstrates good insight into disease process. Alert and Oriented x 3. pleasant and cooperative. General Notes: Sharp debridement was performed utilizing scissors and forceps to remove the necrotic blistered tissue from the field. I did remove everything that was loose from the site and post debridement the hill actually appears to be doing significantly better which is great news. There does not appear to show any signs of infection at this time which is good news. In regard to the lower anterior lower extremity on the left this actually appears to show some signs of potential infection. I am concerned in this regard about things worsening.. There is erythema surrounding the wound bed. Integumentary (Hair, Skin) Wound #15 status is Open. Original cause of wound was Shear/Friction. The wound is located on the [facility] Lower Leg. The wound measures 4.1cm length x 3.7cm width x 0.2cm depth; 11.914cm^2 area and 2.383cm^3 volume. There is Fat Layer (Subcutaneous Tissue) Exposed exposed. There is no tunneling or undermining noted. There is a large amount of serosanguineous drainage noted. The wound margin is flat and intact. There is no granulation within the wound bed. There is a large (67-100%) amount of necrotic tissue within the wound bed including Adherent Slough. The periwound skin appearance exhibited: Erythema. The periwound skin appearance did not exhibit: Callus, Crepitus, Excoriation, Induration, Rash, Scarring, Dry/Scaly, Maceration, Atrophie Blanche, Cyanosis, Ecchymosis, Hemosiderin  Staining, Mottled, Pallor, Rubor. The surrounding wound skin color is noted with erythema which is circumferential. Wound #16 status is Open. Original cause of wound was Pressure Injury. The wound is located on the Left Calcaneus. The wound measures 1.5cm length x 2.5cm width x 0.1cm depth; 2.945cm^2 area and 0.295cm^3 volume. There is Fat Layer (Subcutaneous Tissue) Exposed exposed. There is no tunneling or undermining noted. There is a medium amount of serous drainage noted. The wound margin is indistinct and nonvisible. There is small (1-33%) pale granulation within the wound bed. There is a medium (34-66%) amount of necrotic tissue within the wound bed including Adherent Slough. The periwound skin appearance did not exhibit: Callus, Crepitus, Excoriation, Induration, Rash, Scarring, Dry/Scaly, Maceration, Atrophie Blanche, Cyanosis, Ecchymosis, Hemosiderin Staining, Mottled, Pallor, Rubor, Erythema. Periwound temperature was noted as No Abnormality. The periwound has tenderness on palpation. Assessment Kidane, Denice Paradise  A. ([id number]) Active Problems ICD-10 Unspecified open wound, left lower leg, initial encounter Non-pressure chronic ulcer of other part of left lower leg with fat layer exposed Pressure-induced deep tissue damage of left heel Peripheral vascular disease, unspecified Chronic combined systolic (congestive) and diastolic (congestive) heart failure Chronic kidney disease, stage 4 (severe) Presence of left artificial knee joint Procedures Wound #16 Pre-procedure diagnosis of Wound #16 is a Pressure Ulcer located on the Left Calcaneus . There was a Selective/Open Wound Skin/Epidermis Debridement with a total area of 3.75 sq cm performed by STONE III, HOYT E., PA-C. With the following instrument(s): Forceps, and Scissors to remove Non-Viable tissue/material. Material removed includes Skin: Dermis and Skin: Epidermis and after achieving pain control using Lidocaine 4% Topical Solution.  No specimens were taken. A time out was conducted at 13:32, prior to the start of the procedure. There was no bleeding. The procedure was tolerated well with a pain level of 0 throughout and a pain level of 0 following the procedure. Post Debridement Measurements: 1.5cm length x 2.5cm width x 0.1cm depth; 0.295cm^3 volume. Post debridement Stage noted as Category/Stage III. Character of Wound/Ulcer Post Debridement is improved. Post procedure Diagnosis Wound #16: Same as Pre-Procedure Plan Wound Cleansing: Wound #15 Left,Distal,Anterior Lower Leg: Cleanse wound with mild soap and water Wound #16 Left Calcaneus: Cleanse wound with mild soap and water Primary Wound Dressing: Wound #15 Left,Distal,Anterior Lower Leg: Santyl Ointment Wound #16 Left Calcaneus: Silver Alginate Secondary Dressing: Wound #15 Left,Distal,Anterior Lower Leg: Gauze, ABD and Kerlix/Conform - secure with netting Wound #16 Left Calcaneus: Gauze, ABD and Kerlix/Conform - secure with netting Dressing Change Frequency: Wound #15 Left,Distal,Anterior Lower Leg: Change dressing every day. Wound #16 Left Calcaneus: Change dressing every day. Follow-up Appointments: Wound #15 Left,Distal,Anterior Lower Leg: AMBERLEA, SPAGNUOLO ([id number]) Return Appointment in 1 week. Wound #16 Left Calcaneus: Return Appointment in 1 week. Off-Loading: Wound #16 Left Calcaneus: Other: - Please continue wearing your boot to protect your heel The following medication(s) was prescribed: Keflex oral 750 mg capsule 1 1 capsule oral taken 3 times a day for 10 days starting 03/28/2018 Currently my suggestion is gonna be that we go ahead and initiate the above wound care measures for the next week. Patient is in agreement with the plan. We will subsequently see were things stand at follow-up. Due to the obvious signs of infection surrounding the anterior lower extremity ulcer I'm going to go ahead and initiate treatment with Catholics. I was  debating between this and Bactrim although there was some interaction between the Bactrim and lisinopril potentially causing hyperkalemia. Therefore we will avoid this. She is allergic to penicillin but has taken Keflex without complication before. Otherwise we will see her back for reevaluation in one week. Please see above for specific wound care orders. We will see patient for re-evaluation in 1 week(s) here in the clinic. If anything worsens or changes patient will contact our office for additional recommendations. Electronic Signature(s) Signed: 03/29/2018 8:01:46 PM By: Worthy Keeler PA-C Entered By: Worthy Keeler on 03/28/2018 14:17:51 Ewen, [name] ([id number]) -------------------------------------------------------------------------------- ROS/PFSH Details Patient Name: [name] Date of Service: 03/28/2018 12:45 PM Medical Record Number: [id number] Patient Account Number: [id number] Date of Birth/Sex: 1932/12/19 (85 y.o. F) Treating RN: Cornell Barman Primary Care Provider: Emily Filbert Other Clinician: Referring Provider: Emily Filbert Treating Provider/Extender: Melburn Hake, HOYT Weeks in Treatment: 1 Label Progress Note Print Version as History and Physical for this encounter Information Obtained From Patient Wound History Do you  currently have one or more open woundso Yes How many open wounds do you currently haveo 1 Approximately how long have you had your woundso 2 weeks Have you had any lab work done in the past montho Yes Have you tested positive for osteomyelitis (bone infection)o No Have you had any tests for circulation on your legso No Constitutional Symptoms (General Health) Complaints and Symptoms: Negative for: Fever; Chills Eyes Medical History: Positive for: Cataracts Negative for: Glaucoma; Optic Neuritis Ear/Nose/Mouth/Throat Medical History: Negative for: Chronic sinus problems/congestion; Middle ear problems Past Medical History Notes: HOH- hearing  aids Hematologic/Lymphatic Medical History: Positive for: Anemia Negative for: Hemophilia; Human Immunodeficiency Virus; Lymphedema; Sickle Cell Disease Respiratory Complaints and Symptoms: No Complaints or Symptoms Medical History: Negative for: Aspiration; Asthma; Chronic Obstructive Pulmonary Disease (COPD); Pneumothorax; Sleep Apnea; Tuberculosis Cardiovascular Complaints and Symptoms: No Complaints or Symptoms Kingry, LAJUANA PATCHELL. ([id number]) Medical History: Positive for: Arrhythmia - A fib; Hypertension Negative for: Angina; Congestive Heart Failure; Coronary Artery Disease; Hypotension; Myocardial Infarction; Peripheral Arterial Disease; Peripheral Venous Disease; Phlebitis; Vasculitis Gastrointestinal Medical History: Negative for: Cirrhosis ; Colitis; Crohnos; Hepatitis A; Hepatitis B; Hepatitis C Endocrine Medical History: Negative for: Type I Diabetes; Type II Diabetes Genitourinary Medical History: Negative for: End Stage Renal Disease Past Medical History Notes: frequent UTIs Immunological Medical History: Negative for: Lupus Erythematosus; Raynaudos; Scleroderma Integumentary (Skin) Medical History: Negative for: History of Burn; History of pressure wounds Musculoskeletal Medical History: Positive for: Osteoarthritis Negative for: Gout; Rheumatoid Arthritis Neurologic Medical History: Negative for: Dementia; Neuropathy; Quadriplegia; Paraplegia; Seizure Disorder Oncologic Medical History: Positive for: Received Radiation Negative for: Received Chemotherapy Past Medical History Notes: Breast Ca- R mastectomy Psychiatric Complaints and Symptoms: No Complaints or Symptoms Medical History: Negative for: Anorexia/bulimia; Confinement Anxiety Tourangeau, Allisen A. ([id number]) HBO Extended History Items Eyes: Cataracts Immunizations Pneumococcal Vaccine: Received Pneumococcal Vaccination: No Tetanus Vaccine: Last tetanus shot: 08/15/2008 Implantable  Devices Hospitalization / Surgery History Name of Hospital Purpose of Hospitalization/Surgery Date [facility] (R) knee replacement 09/16/2014 Family and Social History Cancer: Yes - Siblings; Heart Disease: Yes - Father; Hereditary Spherocytosis: No; Hypertension: Yes - Siblings; Kidney Disease: No; Lung Disease: No; Seizures: No; Stroke: Yes - Mother; Thyroid Problems: No; Tuberculosis: No; Never smoker; Marital Status - Married; Alcohol Use: Never; Drug Use: No History; Caffeine Use: Daily; Financial Concerns: No; Food, Clothing or Shelter Needs: No; Support System Lacking: No; Transportation Concerns: No; Advanced Directives: Yes (Not Provided); Patient does not want information on Advanced Directives; Do not resuscitate: No; Living Will: Yes (Not Provided); Medical Power of Attorney: Yes (Not Provided) Physician Affirmation I have reviewed and agree with the above information. Electronic Signature(s) Signed: 03/29/2018 8:01:46 PM By: Worthy Keeler PA-C Signed: 04/01/2018 11:07:52 AM By: Gretta Cool, BSN, RN, CWS, Kim RN, BSN Entered By: Worthy Keeler on 03/28/2018 14:16:10 Molesky, [name] ([id number]) -------------------------------------------------------------------------------- SuperBill Details Patient Name: [name] Date of Service: 03/28/2018 Medical Record Number: [id number] Patient Account Number: [id number] Date of Birth/Sex: 31-May-1932 (84 y.o. F) Treating RN: Cornell Barman Primary Care Provider: Emily Filbert Other Clinician: Referring Provider: Emily Filbert Treating Provider/Extender: Melburn Hake, HOYT Weeks in Treatment: 1 Diagnosis Coding ICD-10 Codes Code Description [phone] Unspecified open wound, left lower leg, initial encounter L97.822 Non-pressure chronic ulcer of other part of left lower leg with fat layer exposed L89.626 Pressure-induced deep tissue damage of left heel I73.9 Peripheral vascular disease, unspecified I50.42 Chronic combined systolic (congestive) and diastolic  (congestive) heart failure N18.4 Chronic kidney disease, stage 4 (severe) Q73.419 Presence of  left artificial knee joint Facility Procedures CPT4 Code: 16109604 Description: [phone] - DEBRIDE WOUND 1ST 20 SQ CM OR < ICD-10 Diagnosis Description L89.626 Pressure-induced deep tissue damage of left heel Modifier: Quantity: 1 Physician Procedures CPT4 Code Description: [id number] - WC PHYS LEVEL 4 - EST PT ICD-10 Diagnosis Description S81.802A Unspecified open wound, left lower leg, initial encounter L97.822 Non-pressure chronic ulcer of other part of left lower leg with f L89.626  Pressure-induced deep tissue damage of left heel I73.9 Peripheral vascular disease, unspecified Modifier: 25 at layer expos Quantity: 1 ed CPT4 Code Description: [id number] - WC PHYS DEBR WO ANESTH 20 SQ CM ICD-10 Diagnosis Description L89.626 Pressure-induced deep tissue damage of left heel Modifier: Quantity: 1 Electronic Signature(s) Signed: 03/29/2018 8:01:46 PM By: Worthy Keeler PA-C Entered By: Worthy Keeler on 03/28/2018 14:18:42

## 2018-04-02 ENCOUNTER — Encounter: Payer: Medicare HMO | Admitting: Physician Assistant

## 2018-04-02 DIAGNOSIS — L89626 Pressure-induced deep tissue damage of left heel: Secondary | ICD-10-CM | POA: Diagnosis not present

## 2018-04-03 NOTE — Progress Notes (Signed)
[name], [name] ([id number]) Visit Report for 04/02/2018 Chief Complaint Document Details Patient Name: [name], [name]. Date of Service: 04/02/2018 1:00 PM Medical Record Number: [id number] Patient Account Number: [id number] Date of Birth/Sex: [birth year] (85 y.o. F) Treating RN: Montey Hora Primary Care Provider: Emily Filbert Other Clinician: Referring Provider: Emily Filbert Treating Provider/Extender: Melburn Hake, Kennedee Kitzmiller Weeks in Treatment: 2 Information Obtained from: Patient Chief Complaint Left heel DTI and left LE ulcer Electronic Signature(s) Signed: 04/02/2018 6:07:41 PM By: Worthy Keeler PA-C Entered By: Worthy Keeler on 04/02/2018 12:58:29 [name], [name] ([id number]) -------------------------------------------------------------------------------- Debridement Details Patient Name: [name]. Date of Service: 04/02/2018 1:00 PM Medical Record Number: [id number] Patient Account Number: [id number] Date of Birth/Sex: [birth year] (85 y.o. F) Treating RN: Montey Hora Primary Care Provider: Emily Filbert Other Clinician: Referring Provider: Emily Filbert Treating Provider/Extender: Melburn Hake, Lashayla Armes Weeks in Treatment: 2 Debridement Performed for Wound #15 Left,Distal,Anterior Lower Leg Assessment: Performed By: Physician STONE III, Jonee Lamore E., PA-C Debridement Type: Debridement Level of Consciousness (Pre- Awake and Alert procedure): Pre-procedure Verification/Time Yes - 13:40 Out Taken: Start Time: 13:40 Pain Control: Lidocaine 4% Topical Solution Total Area Debrided (L x W): 4.3 (cm) x 3.7 (cm) = 15.91 (cm) Tissue and other material Viable, Non-Viable, Slough, Subcutaneous, Slough debrided: Level: Skin/Subcutaneous Tissue Debridement Description: Excisional Instrument: Curette Bleeding: Minimum Hemostasis Achieved: Pressure End Time: 13:45 Procedural Pain: 0 Post Procedural Pain: 0 Response to Treatment: Procedure was tolerated well Level of Consciousness Awake and  Alert (Post-procedure): Post Debridement Measurements of Total Wound Length: (cm) 4.3 Width: (cm) 3.7 Depth: (cm) 0.6 Volume: (cm) 7.497 Character of Wound/Ulcer Post Debridement: Improved Post Procedure Diagnosis Same as Pre-procedure Electronic Signature(s) Signed: 04/02/2018 5:03:08 PM By: Montey Hora Signed: 04/02/2018 6:07:41 PM By: Worthy Keeler PA-C Entered By: Montey Hora on 04/02/2018 13:45:36 [name], [name] ([id number]) -------------------------------------------------------------------------------- HPI Details Patient Name: [name]. Date of Service: 04/02/2018 1:00 PM Medical Record Number: [id number] Patient Account Number: [id number] Date of Birth/Sex: [birth year] (85 y.o. F) Treating RN: Montey Hora Primary Care Provider: Emily Filbert Other Clinician: Referring Provider: Emily Filbert Treating Provider/Extender: Melburn Hake, Janeya Deyo Weeks in Treatment: 2 History of Present Illness HPI Description: 84 year old patient who is known to the wound center for several years now has a new injury to the right lower extremity where she bumped herself against the bath tub. He also has had a chronic right lateral malleolus open wound which has been coming on and off for several years now. This has been there for at least 2014 and was healed out in November 2016. In the past she has been seen by her cardiologist Dr. Kathlyn Sacramento in April 2016. Noninvasive vascular evaluation showed an ABI of 0.68 on the right and 0.85 on the left. 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 One-vessel runoff below the knee via the peroneal artery with short occlusion of the anterior tibial artery and reconstitution. No intervention performed. Most recently she was seen for a wound on her right lower extremity which was healed out by March 15 She is not a smoker and has had no diabetes mellitus. 07/29/2015 -- the  appointment with her cardiologist Dr. Fletcher Anon is still pending for review of her arterial duplex studies. Readmission: 03/19/18 on evaluation today patient presents for readmission concerning an issue she has been having with her left heel and left anterior lower extremity. She has a history of atrial fibrillation and had knee surgery for  a total knee replacement three weeks ago today. Subsequently since being discharged from the hospital it was noted that she did have a left heel deep tissue injury as well the left anterior lower extremity ulcer. This has been present apparently for roughly 2 weeks. She does not appear to have any evidence of infection which is good news. She has been tolerating the dressing changes that have been performed it sounds as if Santyl is what has been used at least most recently. Obviously I think this is likely a good treatment choice for her. Especially in light of the fact that the wound on the distal lower extremity of the left leg appears to show signs of significant slough/eschar buildup which is starting to loosen up now that she is using the central but prior was fairly significant as far as how hard and black it was. She does have pain at both sites the open wound more than the deep tissue injury. Otherwise the patient does have a history of peripheral vascular disease, congestive heart failure, chronic kidney disease stage IV, and now the presence of a left artificial knee joint. 03/28/18 on evaluation today patient appears to be doing a little worse in regard to the dorsal surface of her lower extremity. She has been tolerating the dressing changes without complication although she states there's a lot of pain at this location. Subsequently in regard to her left heel the show signs of continued to drain with maceration noted. I think that some of the dead tissue needs to be removed from the surface of the wound at the site to allow it to heal  appropriately. 04/02/18 on evaluation today patient's wounds actually appear to be doing better at this point in regard to the dorsal wound on the anterior lower leg as well as the heel ulcer. She does have an area of thicker eschar of the heel which I think actually needs to be loosened up I think cental or Iodoflex could be beneficial in this regard. Otherwise debridement will likely be undertaken today over the lower leg region in order to clear away some of the necrotic material. Electronic Signature(s) Signed: 04/02/2018 6:07:41 PM By: Worthy Keeler PA-C Entered By: Worthy Keeler on 04/02/2018 14:51:38 [name], [name] ([id number]) -------------------------------------------------------------------------------- Physical Exam Details Patient Name: [name]. Date of Service: 04/02/2018 1:00 PM Medical Record Number: [id number] Patient Account Number: [id number] Date of Birth/Sex: 1933/01/23 (85 y.o. F) Treating RN: Montey Hora Primary Care Provider: Emily Filbert Other Clinician: Referring Provider: Emily Filbert Treating Provider/Extender: Melburn Hake, Zabrina Brotherton Weeks in Treatment: 2 Constitutional Well-nourished and well-hydrated in no acute distress. Respiratory normal breathing without difficulty. Psychiatric this patient is able to make decisions and demonstrates good insight into disease process. Alert and Oriented x 3. pleasant and cooperative. Notes Sharp debridement was performed today and the patient tolerated this with minimal discomfort. Post debridement the wound bed appears to be doing significantly better which is great news. Fortunately there is no sign of infection at this time. Electronic Signature(s) Signed: 04/02/2018 6:07:41 PM By: Worthy Keeler PA-C Entered By: Worthy Keeler on 04/02/2018 14:52:20 [name], [name] ([id number]) -------------------------------------------------------------------------------- Physician Orders Details Patient Name: [name] Date of  Service: 04/02/2018 1:00 PM Medical Record Number: [id number] Patient Account Number: [id number] Date of Birth/Sex: Jun 13, 1932 (85 y.o. F) Treating RN: Montey Hora Primary Care Provider: Emily Filbert Other Clinician: Referring Provider: Emily Filbert Treating Provider/Extender: Melburn Hake, Cletis Muma Weeks in Treatment: 2 Verbal / Phone Orders:  No Diagnosis Coding ICD-10 Coding Code Description S81.802A Unspecified open wound, left lower leg, initial encounter L97.822 Non-pressure chronic ulcer of other part of left lower leg with fat layer exposed L89.626 Pressure-induced deep tissue damage of left heel I73.9 Peripheral vascular disease, unspecified I50.42 Chronic combined systolic (congestive) and diastolic (congestive) heart failure N18.4 Chronic kidney disease, stage 4 (severe) Z32.992 Presence of left artificial knee joint Wound Cleansing Wound #15 Left,Distal,Anterior Lower Leg o Cleanse wound with mild soap and water Wound #16 Left Calcaneus o Cleanse wound with mild soap and water Primary Wound Dressing Wound #15 Left,Distal,Anterior Lower Leg o Iodoflex Wound #16 Left Calcaneus o Iodoflex - once you run out of santyl Secondary Dressing Wound #15 Left,Distal,Anterior Lower Leg o Gauze, ABD and Kerlix/Conform - secure with netting Wound #16 Left Calcaneus o Gauze, ABD and Kerlix/Conform - secure with netting Dressing Change Frequency Wound #15 Left,Distal,Anterior Lower Leg o Change dressing every day. - once you are using the iodoflex, change the bandage every other day Wound #16 Left Calcaneus o Change dressing every day. - once you are using the iodoflex, change the bandage every other day Follow-up Appointments Wound #15 Left,Distal,Anterior Lower Leg o Return Appointment in 1 week. [name], [name] ([id number]) Wound #16 Left Calcaneus o Return Appointment in 1 week. Off-Loading Wound #16 Left Calcaneus o Other: - Please continue wearing your boot  to protect your heel Patient Medications Allergies: Lipitor, Penicillins, doxycycline, Vioxx, Cipro, amlodipine, Macrobid Notifications Medication Indication Start End Santyl 04/02/2018 DOSE 0 - topical 250 unit/gram ointment - ointment topical applied nickel thick to the wound bed and then cover with a dressing as directed Electronic Signature(s) Signed: 04/02/2018 6:07:41 PM By: Worthy Keeler PA-C Entered By: Worthy Keeler on 04/02/2018 13:57:53 [name], [name] ([id number]) -------------------------------------------------------------------------------- Prescription 04/02/2018 Patient Name: [name]. Provider: Worthy Keeler PA-C Date of Birth: 09-14-1932 NPI#: [id number] Sex: F DEA#: [id number] Phone #: [phone] License #: Patient Address: [facility] [place], [place] [address] [address] [place], [place] [address] [phone] Allergies Lipitor Reaction: joint pain Severity: Moderate Penicillins Reaction: rash Severity: Moderate doxycycline Reaction: nausea, aching, itching, shaky Severity: Moderate Vioxx Reaction: rash Cipro Reaction: nausea amlodipine Reaction: Nausea and vomiting Macrobid Reaction: rash Medication Medication: Route: Strength: Form: Santyl topical 250 unit/gram ointment Class: TOPICAL/MUCOUS MEMBR./SUBCUT. ENZYMES Indication: NAVREET, BOLDA ([id number]) Dose: Frequency / Time: 0 ointment topical applied nickel thick to the wound bed and then cover with a dressing as directed Number of Refills: Number of Units: 0 One Hundred Twenty (120) Gram(s) Generic Substitution: Start Date: End Date: Administered at Substitution Permitted 62/94/7654 Facility: No Note to Pharmacy: Manufacturer's Note: The total quantity has been calculated in grams due to Santyl being available in either 30 or 90 gram tubes. Signature(s): Date(s): Electronic  Signature(s) Signed: 04/02/2018 6:07:41 PM By: Worthy Keeler PA-C Entered By: Worthy Keeler on 04/02/2018 13:57:53 Krotzer, [name] ([id number]) --------------------------------------------------------------------------------  Problem List Details Patient Name: [name]. Date of Service: 04/02/2018 1:00 PM Medical Record Number: [id number] Patient Account Number: [id number] Date of Birth/Sex: 1933/02/07 (85 y.o. F) Treating RN: Montey Hora Primary Care Provider: Emily Filbert Other Clinician: Referring Provider: Emily Filbert Treating Provider/Extender: Melburn Hake, Glennis Borger Weeks in Treatment: 2 Active Problems ICD-10 Evaluated Encounter Code Description Active Date Today Diagnosis S81.802A Unspecified open wound, left lower leg, initial encounter 03/19/2018 No Yes L97.822 Non-pressure chronic ulcer of other part of left lower  leg with 03/19/2018 No Yes fat layer exposed L89.626 Pressure-induced deep tissue damage of left heel 03/19/2018 No Yes I73.9 Peripheral vascular disease, unspecified 03/19/2018 No Yes I50.42 Chronic combined systolic (congestive) and diastolic 10/20/2261 No Yes (congestive) heart failure N18.4 Chronic kidney disease, stage 4 (severe) 03/19/2018 No Yes Z96.652 Presence of left artificial knee joint 03/19/2018 No Yes Inactive Problems Resolved Problems Electronic Signature(s) Signed: 04/02/2018 6:07:41 PM By: Worthy Keeler PA-C Entered By: Worthy Keeler on 04/02/2018 12:58:24 [name], [name] ([id number]) -------------------------------------------------------------------------------- Progress Note/History and Physical Details Patient Name: [name]. Date of Service: 04/02/2018 1:00 PM Medical Record Number: [id number] Patient Account Number: [id number] Date of Birth/Sex: 01/09/1933 (85 y.o. F) Treating RN: Montey Hora Primary Care Provider: Emily Filbert Other Clinician: Referring Provider: Emily Filbert Treating Provider/Extender: Melburn Hake, Adisen Bennion Weeks in  Treatment: 2 Subjective Chief Complaint Information obtained from Patient Left heel DTI and left LE ulcer History of Present Illness (HPI) 84 year old patient who is known to the wound center for several years now has a new injury to the right lower extremity where she bumped herself against the bath tub. He also has had a chronic right lateral malleolus open wound which has been coming on and off for several years now. This has been there for at least 2014 and was healed out in November 2016. In the past she has been seen by her cardiologist Dr. Kathlyn Sacramento in April 2016. Noninvasive vascular evaluation showed an ABI of 0.68 on the right and 0.85 on the left. 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 One-vessel runoff below the knee via the peroneal artery with short occlusion of the anterior tibial artery and reconstitution. No intervention performed. Most recently she was seen for a wound on her right lower extremity which was healed out by March 15 She is not a smoker and has had no diabetes mellitus. 07/29/2015 -- the appointment with her cardiologist Dr. Fletcher Anon is still pending for review of her arterial duplex studies. Readmission: 03/19/18 on evaluation today patient presents for readmission concerning an issue she has been having with her left heel and left anterior lower extremity. She has a history of atrial fibrillation and had knee surgery for a total knee replacement three weeks ago today. Subsequently since being discharged from the hospital it was noted that she did have a left heel deep tissue injury as well the left anterior lower extremity ulcer. This has been present apparently for roughly 2 weeks. She does not appear to have any evidence of infection which is good news. She has been tolerating the dressing changes that have been performed it sounds as if Santyl is what has been used at least most  recently. Obviously I think this is likely a good treatment choice for her. Especially in light of the fact that the wound on the distal lower extremity of the left leg appears to show signs of significant slough/eschar buildup which is starting to loosen up now that she is using the central but prior was fairly significant as far as how hard and black it was. She does have pain at both sites the open wound more than the deep tissue injury. Otherwise the patient does have a history of peripheral vascular disease, congestive heart failure, chronic kidney disease stage IV, and now the presence of a left artificial knee joint. 03/28/18 on evaluation today patient appears to be doing a little worse in regard to the dorsal surface  of her lower extremity. She has been tolerating the dressing changes without complication although she states there's a lot of pain at this location. Subsequently in regard to her left heel the show signs of continued to drain with maceration noted. I think that some of the dead tissue needs to be removed from the surface of the wound at the site to allow it to heal appropriately. 04/02/18 on evaluation today patient's wounds actually appear to be doing better at this point in regard to the dorsal wound on the anterior lower leg as well as the heel ulcer. She does have an area of thicker eschar of the heel which I think actually needs to be loosened up I think cental or Iodoflex could be beneficial in this regard. Otherwise debridement will likely be undertaken today over the lower leg region in order to clear away some of the necrotic material. Wound History Patient presents with 1 open wound that has been present for approximately 2 weeks. Laboratory tests have been performed in the last month. Patient reportedly has tested positive for an antibiotic resistant organism. Patient reportedly has not tested positive for osteomyelitis. Patient reportedly has not had testing  performed to evaluate circulation in the legs. [name], [name] ([id number]) Patient History Information obtained from Patient. Family History Cancer - Siblings, Heart Disease - Father, Hypertension - Siblings, Stroke - Mother, No family history of Hereditary Spherocytosis, Kidney Disease, Lung Disease, Seizures, Thyroid Problems, Tuberculosis. Social History Never smoker, Marital Status - Married, Alcohol Use - Never, Drug Use - No History, Caffeine Use - Daily. Medical History Eyes Patient has history of Cataracts Denies history of Glaucoma, Optic Neuritis Ear/Nose/Mouth/Throat Denies history of Chronic sinus problems/congestion, Middle ear problems Hematologic/Lymphatic Patient has history of Anemia Denies history of Hemophilia, Human Immunodeficiency Virus, Lymphedema, Sickle Cell Disease Respiratory Denies history of Aspiration, Asthma, Chronic Obstructive Pulmonary Disease (COPD), Pneumothorax, Sleep Apnea, Tuberculosis Cardiovascular Patient has history of Arrhythmia - A fib, Hypertension Denies history of Angina, Congestive Heart Failure, Coronary Artery Disease, Hypotension, Myocardial Infarction, Peripheral Arterial Disease, Peripheral Venous Disease, Phlebitis, Vasculitis Gastrointestinal Denies history of Cirrhosis , Colitis, Crohn s, Hepatitis A, Hepatitis B, Hepatitis C Endocrine Denies history of Type I Diabetes, Type II Diabetes Genitourinary Denies history of End Stage Renal Disease Immunological Denies history of Lupus Erythematosus, Raynaud s, Scleroderma Integumentary (Skin) Denies history of History of Burn, History of pressure wounds Musculoskeletal Patient has history of Osteoarthritis Denies history of Gout, Rheumatoid Arthritis Neurologic Denies history of Dementia, Neuropathy, Quadriplegia, Paraplegia, Seizure Disorder Oncologic Patient has history of Received Radiation Denies history of Received Chemotherapy Psychiatric Denies history of  Anorexia/bulimia, Confinement Anxiety Hospitalization/Surgery History - 09/16/2014, [facility], (R) knee replacement. Medical And Surgical History Notes Ear/Nose/Mouth/Throat HOH- hearing aids Genitourinary frequent UTIs Oncologic Breast Ca- R mastectomy Review of Systems (ROS) Constitutional Symptoms (General Health) [name], [name]. ([id number]) Denies complaints or symptoms of Fever, Chills. Respiratory The patient has no complaints or symptoms. Cardiovascular The patient has no complaints or symptoms. Psychiatric The patient has no complaints or symptoms. Objective Constitutional Well-nourished and well-hydrated in no acute distress. Vitals Time Taken: 12:53 PM, Height: 68 in, Weight: 121 lbs, BMI: 18.4, Temperature: 98.2 F, Pulse: 70 bpm, Respiratory Rate: 16 breaths/min, Blood Pressure: 114/44 mmHg. General Notes: Reported low blood pressure reading to Cornell Barman, RN who then reported to [employer], III, PA-C. He looked at last week's reading which was even lower and realized patient normally has very low pressures. Respiratory normal breathing without difficulty. Psychiatric  this patient is able to make decisions and demonstrates good insight into disease process. Alert and Oriented x 3. pleasant and cooperative. General Notes: Sharp debridement was performed today and the patient tolerated this with minimal discomfort. Post debridement the wound bed appears to be doing significantly better which is great news. Fortunately there is no sign of infection at this time. Integumentary (Hair, Skin) Wound #15 status is Open. Original cause of wound was Shear/Friction. The wound is located on the [facility] Lower Leg. The wound measures 4.3cm length x 3.7cm width x 0.2cm depth; 12.496cm^2 area and 2.499cm^3 volume. There is Fat Layer (Subcutaneous Tissue) Exposed exposed. There is no tunneling or undermining noted. There is a large amount of serosanguineous drainage noted. The wound  margin is flat and intact. There is no granulation within the wound bed. There is a large (67-100%) amount of necrotic tissue within the wound bed including Eschar and Adherent Slough. The periwound skin appearance exhibited: Erythema. The periwound skin appearance did not exhibit: Callus, Crepitus, Excoriation, Induration, Rash, Scarring, Dry/Scaly, Maceration, Atrophie Blanche, Cyanosis, Ecchymosis, Hemosiderin Staining, Mottled, Pallor, Rubor. The surrounding wound skin color is noted with erythema which is circumferential. Periwound temperature was noted as No Abnormality. The periwound has tenderness on palpation. Wound #16 status is Open. Original cause of wound was Pressure Injury. The wound is located on the Left Calcaneus. The wound measures 3cm length x 3.5cm width x 0.1cm depth; 8.247cm^2 area and 0.825cm^3 volume. There is Fat Layer (Subcutaneous Tissue) Exposed exposed. There is a medium amount of serous drainage noted. The wound margin is indistinct and nonvisible. There is medium (34-66%) pale granulation within the wound bed. There is a medium (34-66%) amount of necrotic tissue within the wound bed including Eschar and Adherent Slough. The periwound skin appearance did not exhibit: Callus, Crepitus, Excoriation, Induration, Rash, Scarring, Dry/Scaly, Maceration, Atrophie Blanche, Cyanosis, Ecchymosis, Hemosiderin Staining, Mottled, Pallor, Rubor, Erythema. Periwound temperature was noted as No Abnormality. The periwound has tenderness on palpation. [name], [name] ([id number]) Assessment Active Problems ICD-10 Unspecified open wound, left lower leg, initial encounter Non-pressure chronic ulcer of other part of left lower leg with fat layer exposed Pressure-induced deep tissue damage of left heel Peripheral vascular disease, unspecified Chronic combined systolic (congestive) and diastolic (congestive) heart failure Chronic kidney disease, stage 4 (severe) Presence of left artificial  knee joint Procedures Wound #15 Pre-procedure diagnosis of Wound #15 is a Skin Tear located on the Left,Distal,Anterior Lower Leg . There was a Excisional Skin/Subcutaneous Tissue Debridement with a total area of 15.91 sq cm performed by STONE III, Taura Lamarre E., PA-C. With the following instrument(s): Curette to remove Viable and Non-Viable tissue/material. Material removed includes Subcutaneous Tissue and Slough and after achieving pain control using Lidocaine 4% Topical Solution. No specimens were taken. A time out was conducted at 13:40, prior to the start of the procedure. A Minimum amount of bleeding was controlled with Pressure. The procedure was tolerated well with a pain level of 0 throughout and a pain level of 0 following the procedure. Post Debridement Measurements: 4.3cm length x 3.7cm width x 0.6cm depth; 7.497cm^3 volume. Character of Wound/Ulcer Post Debridement is improved. Post procedure Diagnosis Wound #15: Same as Pre-Procedure Plan Wound Cleansing: Wound #15 Left,Distal,Anterior Lower Leg: Cleanse wound with mild soap and water Wound #16 Left Calcaneus: Cleanse wound with mild soap and water Primary Wound Dressing: Wound #15 Left,Distal,Anterior Lower Leg: Iodoflex Wound #16 Left Calcaneus: Iodoflex - once you run out of santyl Secondary Dressing: Wound #15 Left,Distal,Anterior  Lower Leg: Gauze, ABD and Kerlix/Conform - secure with netting Wound #16 Left Calcaneus: Gauze, ABD and Kerlix/Conform - secure with netting Dressing Change Frequency: Wound #15 Left,Distal,Anterior Lower Leg: Change dressing every day. - once you are using the iodoflex, change the bandage every other day [name], [employer]. ([id number]) Wound #16 Left Calcaneus: Change dressing every day. - once you are using the iodoflex, change the bandage every other day Follow-up Appointments: Wound #15 Left,Distal,Anterior Lower Leg: Return Appointment in 1 week. Wound #16 Left Calcaneus: Return Appointment in  1 week. Off-Loading: Wound #16 Left Calcaneus: Other: - Please continue wearing your boot to protect your heel The following medication(s) was prescribed: Santyl topical 250 unit/gram ointment 0 ointment topical applied nickel thick to the wound bed and then cover with a dressing as directed starting 04/02/2018 My suggestion currently is gonna be that we go ahead and continue with treatment using Santyl. With that being said I am gonna send a prescription for Santyl with the patient's husband today for him to check in to see how much this is going to cost. Depending on how much it cost they may not get this and in that case they will actually utilize Iodoflex as the treatment of choice. The patient and her husband are in agreement with the plan. Subsequently I will see him back for reevaluation next week to see were things stand. Please see above for specific wound care orders. We will see patient for re-evaluation in 1 week(s) here in the clinic. If anything worsens or changes patient will contact our office for additional recommendations. Electronic Signature(s) Signed: 04/02/2018 6:07:41 PM By: Worthy Keeler PA-C Entered By: Worthy Keeler on 04/02/2018 14:52:42 Boyajian, [name] ([id number]) -------------------------------------------------------------------------------- ROS/PFSH Details Patient Name: [name] Date of Service: 04/02/2018 1:00 PM Medical Record Number: [id number] Patient Account Number: [id number] Date of Birth/Sex: [birth year] (85 y.o. F) Treating RN: Montey Hora Primary Care Provider: Emily Filbert Other Clinician: Referring Provider: Emily Filbert Treating Provider/Extender: Melburn Hake, Neera Teng Weeks in Treatment: 2 Label Progress Note Print Version as History and Physical for this encounter Information Obtained From Patient Wound History Do you currently have one or more open woundso Yes How many open wounds do you currently haveo 1 Approximately how long have you  had your woundso 2 weeks Have you had any lab work done in the past montho Yes Have you tested positive for osteomyelitis (bone infection)o No Have you had any tests for circulation on your legso No Constitutional Symptoms (General Health) Complaints and Symptoms: Negative for: Fever; Chills Eyes Medical History: Positive for: Cataracts Negative for: Glaucoma; Optic Neuritis Ear/Nose/Mouth/Throat Medical History: Negative for: Chronic sinus problems/congestion; Middle ear problems Past Medical History Notes: HOH- hearing aids Hematologic/Lymphatic Medical History: Positive for: Anemia Negative for: Hemophilia; Human Immunodeficiency Virus; Lymphedema; Sickle Cell Disease Respiratory Complaints and Symptoms: No Complaints or Symptoms Medical History: Negative for: Aspiration; Asthma; Chronic Obstructive Pulmonary Disease (COPD); Pneumothorax; Sleep Apnea; Tuberculosis Cardiovascular Complaints and Symptoms: No Complaints or Symptoms Kaps, DONYE DAUENHAUER. ([id number]) Medical History: Positive for: Arrhythmia - A fib; Hypertension Negative for: Angina; Congestive Heart Failure; Coronary Artery Disease; Hypotension; Myocardial Infarction; Peripheral Arterial Disease; Peripheral Venous Disease; Phlebitis; Vasculitis Gastrointestinal Medical History: Negative for: Cirrhosis ; Colitis; Crohnos; Hepatitis A; Hepatitis B; Hepatitis C Endocrine Medical History: Negative for: Type I Diabetes; Type II Diabetes Genitourinary Medical History: Negative for: End Stage Renal Disease Past Medical History Notes: frequent UTIs Immunological Medical History: Negative for: Lupus Erythematosus; Raynaudos; Scleroderma Integumentary (Skin)  Medical History: Negative for: History of Burn; History of pressure wounds Musculoskeletal Medical History: Positive for: Osteoarthritis Negative for: Gout; Rheumatoid Arthritis Neurologic Medical History: Negative for: Dementia; Neuropathy; Quadriplegia;  Paraplegia; Seizure Disorder Oncologic Medical History: Positive for: Received Radiation Negative for: Received Chemotherapy Past Medical History Notes: Breast Ca- R mastectomy Psychiatric Complaints and Symptoms: No Complaints or Symptoms Medical History: Negative for: Anorexia/bulimia; Confinement Anxiety Vinsant, Cassidee A. ([id number]) HBO Extended History Items Eyes: Cataracts Immunizations Pneumococcal Vaccine: Received Pneumococcal Vaccination: No Tetanus Vaccine: Last tetanus shot: 08/15/2008 Implantable Devices Hospitalization / Surgery History Name of Hospital Purpose of Hospitalization/Surgery Date [facility] (R) knee replacement 09/16/2014 Family and Social History Cancer: Yes - Siblings; Heart Disease: Yes - Father; Hereditary Spherocytosis: No; Hypertension: Yes - Siblings; Kidney Disease: No; Lung Disease: No; Seizures: No; Stroke: Yes - Mother; Thyroid Problems: No; Tuberculosis: No; Never smoker; Marital Status - Married; Alcohol Use: Never; Drug Use: No History; Caffeine Use: Daily; Financial Concerns: No; Food, Clothing or Shelter Needs: No; Support System Lacking: No; Transportation Concerns: No; Advanced Directives: Yes (Not Provided); Patient does not want information on Advanced Directives; Do not resuscitate: No; Living Will: Yes (Not Provided); Medical Power of Attorney: Yes (Not Provided) Physician Affirmation I have reviewed and agree with the above information. Electronic Signature(s) Signed: 04/02/2018 5:03:08 PM By: Montey Hora Signed: 04/02/2018 6:07:41 PM By: Worthy Keeler PA-C Entered By: Worthy Keeler on 04/02/2018 14:52:09 Stavola, [name] ([id number]) -------------------------------------------------------------------------------- SuperBill Details Patient Name: [name]. Date of Service: 04/02/2018 Medical Record Number: [id number] Patient Account Number: [id number] Date of Birth/Sex: February 06, 1933 (84 y.o. F) Treating RN: Montey Hora Primary Care  Provider: Emily Filbert Other Clinician: Referring Provider: Emily Filbert Treating Provider/Extender: Melburn Hake, Yohan Samons Weeks in Treatment: 2 Diagnosis Coding ICD-10 Codes Code Description [phone] Unspecified open wound, left lower leg, initial encounter L97.822 Non-pressure chronic ulcer of other part of left lower leg with fat layer exposed L89.626 Pressure-induced deep tissue damage of left heel I73.9 Peripheral vascular disease, unspecified I50.42 Chronic combined systolic (congestive) and diastolic (congestive) heart failure N18.4 Chronic kidney disease, stage 4 (severe) T34.287 Presence of left artificial knee joint Facility Procedures CPT4 Code Description: [id number] 11042 - DEB SUBQ TISSUE 20 SQ CM/< ICD-10 Diagnosis Description L97.822 Non-pressure chronic ulcer of other part of left lower leg wit Modifier: h fat layer expos Quantity: 1 ed Physician Procedures CPT4 Code Description: [id number] - WC PHYS LEVEL 4 - EST PT ICD-10 Diagnosis Description S81.802A Unspecified open wound, left lower leg, initial encounter L97.822 Non-pressure chronic ulcer of other part of left lower leg wit L89.626  Pressure-induced deep tissue damage of left heel I73.9 Peripheral vascular disease, unspecified Modifier: 25 h fat layer expos Quantity: 1 ed CPT4 Code Description: [id number] 11042 - WC PHYS SUBQ TISS 20 SQ CM ICD-10 Diagnosis Description L97.822 Non-pressure chronic ulcer of other part of left lower leg wit Modifier: h fat layer expos Quantity: 1 ed Electronic Signature(s) Signed: 04/02/2018 6:07:41 PM By: Worthy Keeler PA-C Entered By: Worthy Keeler on 04/02/2018 14:53:05

## 2018-04-09 ENCOUNTER — Encounter: Payer: Medicare HMO | Admitting: Physician Assistant

## 2018-04-09 DIAGNOSIS — L89626 Pressure-induced deep tissue damage of left heel: Secondary | ICD-10-CM | POA: Diagnosis not present

## 2018-04-10 NOTE — Progress Notes (Signed)
[name], [name] ([id number]) Visit Report for 04/09/2018 Arrival Information Details Patient Name: [name], [name]. Date of Service: 04/09/2018 9:45 AM Medical Record Number: [id number] Patient Account Number: [id number] Date of Birth/Sex: 1932/07/11 (85 y.o. F) Treating RN: Cornell Barman Primary Care Tinaya Ceballos: Emily Filbert Other Clinician: Referring Eadie Repetto: Emily Filbert Treating Trenace Coughlin/Extender: Melburn Hake, HOYT Weeks in Treatment: 3 Visit Information History Since Last Visit Added or deleted any medications: No Patient Arrived: Ambulatory Any new allergies or adverse reactions: No Arrival Time: 10:03 Had a fall or experienced change in No Accompanied By: self activities of daily living that may affect Transfer Assistance: None risk of falls: Patient Identification Verified: Yes Signs or symptoms of abuse/neglect since last visito No Secondary Verification Process Yes Hospitalized since last visit: No Completed: Implantable device outside of the clinic excluding No Patient Has Alerts: Yes cellular tissue based products placed in the center Patient Alerts: Patient on Blood since last visit: Thinner Has Dressing in Place as Prescribed: Yes Eliquis Pain Present Now: No ABI 05/23/17 L.88 R 1.16 Electronic Signature(s) Signed: 04/09/2018 1:27:23 PM By: Gretta Cool, BSN, RN, CWS, Kim RN, BSN Entered By: Gretta Cool, BSN, RN, CWS, Kim on 04/09/2018 10:03:56 Skarzynski, [name] ([id number]) -------------------------------------------------------------------------------- Encounter Discharge Information Details Patient Name: [name]. Date of Service: 04/09/2018 9:45 AM Medical Record Number: [id number] Patient Account Number: [id number] Date of Birth/Sex: 02/16/1933 (85 y.o. F) Treating RN: Montey Hora Primary Care Braylynn Ghan: Emily Filbert Other Clinician: Referring Mister Krahenbuhl: Emily Filbert Treating Cypher Paule/Extender: Melburn Hake, HOYT Weeks in Treatment: 3 Encounter Discharge Information Items Post  Procedure Vitals Discharge Condition: Stable Temperature (F): 98.0 Ambulatory Status: Ambulatory Pulse (bpm): 78 Discharge Destination: Home Respiratory Rate (breaths/min): 16 Transportation: Private Auto Blood Pressure (mmHg): 125/68 Accompanied By: spouse Schedule Follow-up Appointment: Yes Clinical Summary of Care: Electronic Signature(s) Signed: 04/09/2018 1:47:23 PM By: Montey Hora Entered By: Montey Hora on 04/09/2018 10:48:30 Strebeck, [name] ([id number]) -------------------------------------------------------------------------------- Lower Extremity Assessment Details Patient Name: [name]. Date of Service: 04/09/2018 9:45 AM Medical Record Number: [id number] Patient Account Number: [id number] Date of Birth/Sex: Jul 12, 1932 (85 y.o. F) Treating RN: Cornell Barman Primary Care Jeena Arnett: Emily Filbert Other Clinician: Referring Tevon Berhane: Emily Filbert Treating Climmie Cronce/Extender: Melburn Hake, HOYT Weeks in Treatment: 3 Vascular Assessment Pulses: Dorsalis Pedis Palpable: [Left:Yes] Posterior Tibial Extremity colors, hair growth, and conditions: Extremity Color: [Left:Hyperpigmented] Hair Growth on Extremity: [Left:No] Temperature of Extremity: [Left:Warm] Capillary Refill: [Left:< 3 seconds] Toe Nail Assessment Left: Right: Thick: No Discolored: No Deformed: No Improper Length and Hygiene: No Notes toe nails are painted Electronic Signature(s) Signed: 04/09/2018 1:27:23 PM By: Gretta Cool, BSN, RN, CWS, Kim RN, BSN Entered By: Gretta Cool, BSN, RN, CWS, Kim on 04/09/2018 10:15:00 Hacking, [name] ([id number]) -------------------------------------------------------------------------------- Multi Wound Chart Details Patient Name: [name]. Date of Service: 04/09/2018 9:45 AM Medical Record Number: [id number] Patient Account Number: [id number] Date of Birth/Sex: 05/04/1932 (85 y.o. F) Treating RN: Montey Hora Primary Care Ebbie Cherry: Emily Filbert Other Clinician: Referring  Berthold Glace: Emily Filbert Treating Skyeler Smola/Extender: Melburn Hake, HOYT Weeks in Treatment: 3 Vital Signs Height(in): 61 Pulse(bpm): 73 Weight(lbs): 121 Blood Pressure(mmHg): 125/68 Body Mass Index(BMI): 18 Temperature(F): 98.0 Respiratory Rate 16 (breaths/min): Photos: [15:No Photos] [16:No Photos] [N/A:N/A] Wound Location: [15:Left Lower Leg - Anterior, Distal] [16:Left Calcaneus] [N/A:N/A] Wounding Event: [15:Shear/Friction] [16:Pressure Injury] [N/A:N/A] Primary Etiology: [15:Skin Tear] [16:Pressure Ulcer] [N/A:N/A] Comorbid History: [15:Cataracts, Anemia, Arrhythmia, Hypertension, Osteoarthritis, Received Radiation] [16:Cataracts, Anemia, Arrhythmia, Hypertension, Osteoarthritis, Received Radiation] [N/A:N/A] Date Acquired: [15:03/05/2018] [16:03/11/2018] [N/A:N/A] Weeks of Treatment: [15:3] [16:3] [N/A:N/A] Wound Status: [  15:Open] [16:Open] [N/A:N/A] Measurements L x W x D [15:4x3.7x0.2] [16:4.3x5x0.1] [N/A:N/A] (cm) Area (cm) : [15:11.624] [16:16.886] [N/A:N/A] Volume (cm) : [15:2.325] [16:1.689] [N/A:N/A] % Reduction in Area: [15:21.30%] [16:-3485.10%] [N/A:N/A] % Reduction in Volume: [15:21.30%] [16:-3493.60%] [N/A:N/A] Classification: [15:Full Thickness Without Exposed Support Structures] [16:Category/Stage III] [N/A:N/A] Exudate Amount: [15:Large] [16:Medium] [N/A:N/A] Exudate Type: [15:Serosanguineous] [16:Serous] [N/A:N/A] Exudate Color: [15:red, brown] [16:amber] [N/A:N/A] Wound Margin: [15:Flat and Intact] [16:Indistinct, nonvisible] [N/A:N/A] Granulation Amount: [15:None Present (0%)] [16:Small (1-33%)] [N/A:N/A] Granulation Quality: [15:N/A] [16:Pale] [N/A:N/A] Necrotic Amount: [15:Large (67-100%)] [16:Medium (34-66%)] [N/A:N/A] Necrotic Tissue: [15:Eschar, Adherent Slough] [16:Eschar, Adherent Slough] [N/A:N/A] Exposed Structures: [15:Fat Layer (Subcutaneous Tissue) Exposed: Yes Fascia: No Tendon: No Muscle: No Joint: No Bone: No] [16:Fat Layer (Subcutaneous  Tissue) Exposed: Yes Fascia: No Tendon: No Muscle: No Joint: No Bone: No] [N/A:N/A] Epithelialization: [15:None] [16:Small (1-33%)] [N/A:N/A] Periwound Skin Texture: [N/A:N/A] Excoriation: No Excoriation: No Induration: No Induration: No Callus: No Callus: No Crepitus: No Crepitus: No Rash: No Rash: No Scarring: No Scarring: No Periwound Skin Moisture: Maceration: Yes Maceration: No N/A Dry/Scaly: No Dry/Scaly: No Periwound Skin Color: Erythema: Yes Atrophie Blanche: No N/A Atrophie Blanche: No Cyanosis: No Cyanosis: No Ecchymosis: No Ecchymosis: No Erythema: No Hemosiderin Staining: No Hemosiderin Staining: No Mottled: No Mottled: No Pallor: No Pallor: No Rubor: No Rubor: No Erythema Location: Circumferential N/A N/A Temperature: No Abnormality No Abnormality N/A Tenderness on Palpation: Yes Yes N/A Wound Preparation: Ulcer Cleansing: Ulcer Cleansing: N/A Rinsed/Irrigated with Saline Rinsed/Irrigated with Saline Topical Anesthetic Applied: Topical Anesthetic Applied: Other: lidocaine 4% None Treatment Notes Electronic Signature(s) Signed: 04/09/2018 1:47:23 PM By: Montey Hora Entered By: Montey Hora on 04/09/2018 10:37:18 Lubke, [name] ([id number]) -------------------------------------------------------------------------------- [place] Details Patient Name: [name]. Date of Service: 04/09/2018 9:45 AM Medical Record Number: [id number] Patient Account Number: [id number] Date of Birth/Sex: Aug 18, 1932 (85 y.o. F) Treating RN: Montey Hora Primary Care Maxamus Colao: Emily Filbert Other Clinician: Referring Jamall Strohmeier: Emily Filbert Treating Camari Quintanilla/Extender: Melburn Hake, HOYT Weeks in Treatment: 3 Active Inactive Abuse / Safety / Falls / Self Care Management Nursing Diagnoses: Potential for falls Goals: Patient will not develop complications from immobility Date Initiated: 03/19/2018 Target Resolution Date: 06/22/2018 Goal Status:  Active Interventions: Assess fall risk on admission and as needed Notes: Nutrition Nursing Diagnoses: Potential for alteratiion in Nutrition/Potential for imbalanced nutrition Goals: Patient/caregiver agrees to and verbalizes understanding of need to use nutritional supplements and/or vitamins as prescribed Date Initiated: 03/19/2018 Target Resolution Date: 06/22/2018 Goal Status: Active Interventions: Assess patient nutrition upon admission and as needed per policy Notes: Orientation to the Wound Care Program Nursing Diagnoses: Knowledge deficit related to the wound healing center program Goals: Patient/caregiver will verbalize understanding of the [facility] Date Initiated: 03/19/2018 Target Resolution Date: 06/22/2018 Goal Status: Active Interventions: Provide education on orientation to the wound center [name], [name]. ([id number]) Notes: Pressure Nursing Diagnoses: Potential for impaired tissue integrity related to pressure, friction, moisture, and shear Goals: Patient will remain free from development of additional pressure ulcers Date Initiated: 03/19/2018 Target Resolution Date: 06/22/2018 Goal Status: Active Interventions: Assess potential for pressure ulcer upon admission and as needed Provide education on pressure ulcers Notes: Wound/Skin Impairment Nursing Diagnoses: Impaired tissue integrity Goals: Ulcer/skin breakdown will heal within 14 weeks Date Initiated: 03/19/2018 Target Resolution Date: 06/22/2018 Goal Status: Active Interventions: Assess patient/caregiver ability to obtain necessary supplies Assess patient/caregiver ability to perform ulcer/skin care regimen upon admission and as needed Assess ulceration(s) every visit Notes: Electronic Signature(s) Signed: 04/09/2018 1:47:23 PM By: Montey Hora  Entered By: Montey Hora on 04/09/2018 10:37:02 Moccia, [name]  ([id number]) -------------------------------------------------------------------------------- Pain Assessment Details Patient Name: [name], [name]. Date of Service: 04/09/2018 9:45 AM Medical Record Number: [id number] Patient Account Number: [id number] Date of Birth/Sex: Mar 28, 1933 (85 y.o. F) Treating RN: Cornell Barman Primary Care Batul Diego: Emily Filbert Other Clinician: Referring Rozell Kettlewell: Emily Filbert Treating Veleta Yamamoto/Extender: Melburn Hake, HOYT Weeks in Treatment: 3 Active Problems Location of Pain Severity and Description of Pain Patient Has Paino Yes Site Locations Pain Location: Pain in Ulcers Rate the pain. Current Pain Level: 4 Character of Pain Describe the Pain: Burning, Stabbing Pain Management and Medication Current Pain Management: Electronic Signature(s) Signed: 04/09/2018 1:27:23 PM By: Gretta Cool, BSN, RN, CWS, Kim RN, BSN Entered By: Gretta Cool, BSN, RN, CWS, Kim on 04/09/2018 10:04:13 Weisberg, [name] ([id number]) -------------------------------------------------------------------------------- Patient/Caregiver Education Details Patient Name: [name] Date of Service: 04/09/2018 9:45 AM Medical Record Number: [id number] Patient Account Number: [id number] Date of Birth/Gender: [birth year] (85 y.o. F) Treating RN: Montey Hora Primary Care Physician: Emily Filbert Other Clinician: Referring Physician: Emily Filbert Treating Physician/Extender: Sharalyn Ink in Treatment: 3 Education Assessment Education Provided To: Patient and Caregiver Education Topics Provided Wound/Skin Impairment: Handouts: Other: wound care as ordered Methods: Demonstration, Explain/Verbal Responses: State content correctly Electronic Signature(s) Signed: 04/09/2018 1:47:23 PM By: Montey Hora Entered By: Montey Hora on 04/09/2018 10:47:28 Bungert, [name] ([id number]) -------------------------------------------------------------------------------- Wound Assessment Details Patient Name:  [name]. Date of Service: 04/09/2018 9:45 AM Medical Record Number: [id number] Patient Account Number: [id number] Date of Birth/Sex: June 23, 1932 (85 y.o. F) Treating RN: Cornell Barman Primary Care Donnel Venuto: Emily Filbert Other Clinician: Referring Louann Hopson: Emily Filbert Treating Chayah Mckee/Extender: Melburn Hake, HOYT Weeks in Treatment: 3 Wound Status Wound Number: 15 Primary Skin Tear Etiology: Wound Location: Left Lower Leg - Anterior, Distal Wound Open Wounding Event: Shear/Friction Status: Date Acquired: 03/05/2018 Comorbid Cataracts, Anemia, Arrhythmia, Hypertension, Weeks Of Treatment: 3 History: Osteoarthritis, Received Radiation Clustered Wound: No Photos Photo Uploaded By: Gretta Cool, BSN, RN, CWS, Kim on 04/09/2018 11:52:11 Wound Measurements Length: (cm) 4 Width: (cm) 3.7 Depth: (cm) 0.2 Area: (cm) 11.624 Volume: (cm) 2.325 % Reduction in Area: 21.3% % Reduction in Volume: 21.3% Epithelialization: None Tunneling: No Undermining: No Wound Description Full Thickness Without Exposed Support Foul Od Classification: Structures Slough/ Wound Margin: Flat and Intact Exudate Large Amount: Exudate Type: Serosanguineous Exudate Color: red, brown or After Cleansing: No Fibrino Yes Wound Bed Granulation Amount: None Present (0%) Exposed Structure Necrotic Amount: Large (67-100%) Fascia Exposed: No Necrotic Quality: Eschar, Adherent Slough Fat Layer (Subcutaneous Tissue) Exposed: Yes Tendon Exposed: No Muscle Exposed: No Joint Exposed: No Bone Exposed: No Bailey, Latoya A. ([id number]) Periwound Skin Texture Texture Color No Abnormalities Noted: No No Abnormalities Noted: No Callus: No Atrophie Blanche: No Crepitus: No Cyanosis: No Excoriation: No Ecchymosis: No Induration: No Erythema: Yes Rash: No Erythema Location: Circumferential Scarring: No Hemosiderin Staining: No Mottled: No Moisture Pallor: No No Abnormalities Noted: No Rubor: No Dry / Scaly:  No Maceration: Yes Temperature / Pain Temperature: No Abnormality Tenderness on Palpation: Yes Wound Preparation Ulcer Cleansing: Rinsed/Irrigated with Saline Topical Anesthetic Applied: Other: lidocaine 4%, Treatment Notes Wound #15 (Left, Distal, Anterior Lower Leg) Notes heel - santyl, lower leg - santyl, ABD and conform secured with netting Electronic Signature(s) Signed: 04/09/2018 1:27:23 PM By: Gretta Cool, BSN, RN, CWS, Kim RN, BSN Entered By: Gretta Cool, BSN, RN, CWS, Kim on 04/09/2018 10:13:39 Messing, [name] ([id number]) -------------------------------------------------------------------------------- Wound Assessment Details Patient Name: [name]. Date of Service: 04/09/2018  9:45 AM Medical Record Number: [id number] Patient Account Number: [id number] Date of Birth/Sex: 01/15/1933 (85 y.o. F) Treating RN: Cornell Barman Primary Care Shantika Bermea: Emily Filbert Other Clinician: Referring Cullen Lahaie: Emily Filbert Treating Timofey Carandang/Extender: Melburn Hake, HOYT Weeks in Treatment: 3 Wound Status Wound Number: 16 Primary Pressure Ulcer Etiology: Wound Location: Left Calcaneus Wound Open Wounding Event: Pressure Injury Status: Date Acquired: 03/11/2018 Comorbid Cataracts, Anemia, Arrhythmia, Hypertension, Weeks Of Treatment: 3 History: Osteoarthritis, Received Radiation Clustered Wound: No Photos Photo Uploaded By: Gretta Cool, BSN, RN, CWS, Kim on 04/09/2018 11:52:12 Wound Measurements Length: (cm) 4.3 Width: (cm) 5 Depth: (cm) 0.1 Area: (cm) 16.886 Volume: (cm) 1.689 % Reduction in Area: -3485.1% % Reduction in Volume: -3493.6% Epithelialization: Small (1-33%) Tunneling: No Undermining: No Wound Description Classification: Category/Stage III Foul Odor Wound Margin: Indistinct, nonvisible Slough/Fi Exudate Amount: Medium Exudate Type: Serous Exudate Color: amber After Cleansing: No brino Yes Wound Bed Granulation Amount: Small (1-33%) Exposed Structure Granulation Quality:  Pale Fascia Exposed: No Necrotic Amount: Medium (34-66%) Fat Layer (Subcutaneous Tissue) Exposed: Yes Necrotic Quality: Eschar, Adherent Slough Tendon Exposed: No Muscle Exposed: No Joint Exposed: No Bone Exposed: No Periwound Skin Texture Danzy, Angelle A. ([id number]) Texture Color No Abnormalities Noted: No No Abnormalities Noted: No Callus: No Atrophie Blanche: No Crepitus: No Cyanosis: No Excoriation: No Ecchymosis: No Induration: No Erythema: No Rash: No Hemosiderin Staining: No Scarring: No Mottled: No Pallor: No Moisture Rubor: No No Abnormalities Noted: No Dry / Scaly: No Temperature / Pain Maceration: No Temperature: No Abnormality Tenderness on Palpation: Yes Wound Preparation Ulcer Cleansing: Rinsed/Irrigated with Saline Topical Anesthetic Applied: None Treatment Notes Wound #16 (Left Calcaneus) Notes heel - santyl, lower leg - santyl, ABD and conform secured with netting Electronic Signature(s) Signed: 04/09/2018 1:27:23 PM By: Gretta Cool, BSN, RN, CWS, Kim RN, BSN Entered By: Gretta Cool, BSN, RN, CWS, Kim on 04/09/2018 10:14:13 Mings, [name] ([id number]) -------------------------------------------------------------------------------- Vitals Details Patient Name: [name]. Date of Service: 04/09/2018 9:45 AM Medical Record Number: [id number] Patient Account Number: [id number] Date of Birth/Sex: [birth year] (85 y.o. F) Treating RN: Cornell Barman Primary Care Bennye Nix: Emily Filbert Other Clinician: Referring Andrus Sharp: Emily Filbert Treating Letina Luckett/Extender: Melburn Hake, HOYT Weeks in Treatment: 3 Vital Signs Time Taken: 10:05 Temperature (F): 98.0 Height (in): 68 Pulse (bpm): 78 Weight (lbs): 121 Respiratory Rate (breaths/min): 16 Body Mass Index (BMI): 18.4 Blood Pressure (mmHg): 125/68 Reference Range: 80 - 120 mg / dl Electronic Signature(s) Signed: 04/09/2018 1:27:23 PM By: Gretta Cool, BSN, RN, CWS, Kim RN, BSN Entered By: Gretta Cool, BSN, RN, CWS, Kim on 04/09/2018  10:17:04

## 2018-04-10 NOTE — Progress Notes (Signed)
DAYLEEN, BESKE ([id number]) Visit Report for 04/09/2018 Chief Complaint Document Details Patient Name: [name], [name]. Date of Service: 04/09/2018 9:45 AM Medical Record Number: [id number] Patient Account Number: [id number] Date of Birth/Sex: [birth year][name]-1934 (85 y.o. F) Treating RN: Montey Hora Primary Care Provider: Emily Filbert Other Clinician: Referring Provider: Emily Filbert Treating Provider/Extender: Melburn Hake, HOYT Weeks in Treatment: 3 Information Obtained from: Patient Chief Complaint Left heel DTI and left LE ulcer Electronic Signature(s) Signed: 04/09/2018 1:28:21 PM By: Worthy Keeler PA-C Entered By: Worthy Keeler on 04/09/2018 09:58:50 [name], [name] ([id number]) -------------------------------------------------------------------------------- Debridement Details Patient Name: [name]. Date of Service: 04/09/2018 9:45 AM Medical Record Number: [id number] Patient Account Number: [id number] Date of Birth/Sex: April 07, 1933 (85 y.o. F) Treating RN: Montey Hora Primary Care Provider: Emily Filbert Other Clinician: Referring Provider: Emily Filbert Treating Provider/Extender: Melburn Hake, HOYT Weeks in Treatment: 3 Debridement Performed for Wound #16 Left Calcaneus Assessment: Performed By: Physician STONE III, HOYT E., PA-C Debridement Type: Debridement Level of Consciousness (Pre- Awake and Alert procedure): Pre-procedure Verification/Time Yes - 10:42 Out Taken: Start Time: 10:42 Pain Control: Lidocaine 4% Topical Solution Total Area Debrided (L x W): 4.3 (cm) x 5 (cm) = 21.5 (cm) Tissue and other material Viable, Non-Viable, Slough, Subcutaneous, Slough debrided: Level: Skin/Subcutaneous Tissue Debridement Description: Excisional Instrument: Curette Bleeding: Minimum Hemostasis Achieved: Pressure End Time: 10:46 Procedural Pain: 0 Post Procedural Pain: 0 Response to Treatment: Procedure was tolerated well Level of Consciousness Awake and  Alert (Post-procedure): Post Debridement Measurements of Total Wound Length: (cm) 4.3 Stage: Category/Stage III Width: (cm) 5 Depth: (cm) 0.3 Volume: (cm) 5.066 Character of Wound/Ulcer Post Improved Debridement: Post Procedure Diagnosis Same as Pre-procedure Electronic Signature(s) Signed: 04/09/2018 1:28:21 PM By: Worthy Keeler PA-C Signed: 04/09/2018 1:47:23 PM By: Montey Hora Entered By: Montey Hora on 04/09/2018 10:46:20 [name], [name] ([id number]) -------------------------------------------------------------------------------- HPI Details Patient Name: [name]. Date of Service: 04/09/2018 9:45 AM Medical Record Number: [id number] Patient Account Number: [id number] Date of Birth/Sex: 07/09/1932 (85 y.o. F) Treating RN: Montey Hora Primary Care Provider: Emily Filbert Other Clinician: Referring Provider: Emily Filbert Treating Provider/Extender: Melburn Hake, HOYT Weeks in Treatment: 3 History of Present Illness HPI Description: 84 year old patient who is known to the wound center for several years now has a new injury to the right lower extremity where she bumped herself against the bath tub. He also has had a chronic right lateral malleolus open wound which has been coming on and off for several years now. This has been there for at least 2014 and was healed out in November 2016. In the past she has been seen by her cardiologist Dr. Kathlyn Sacramento in April 2016. Noninvasive vascular evaluation showed an ABI of 0.68 on the right and 0.85 on the left. Angiography in [name] 2016 showed moderate nonobstructive disease affecting the distal right SFA and popliteal artery. One-vessel runoff below the knee via the peroneal artery with short occlusion of the anterior tibial artery and reconstitution. No intervention performed. Most recently she was seen for a wound on her right lower extremity which was healed out by March 15 She is not a smoker and has had no diabetes  mellitus. 07/29/2015 -- the appointment with her cardiologist Dr. Fletcher Anon is still pending for review of her arterial duplex studies. Readmission: 03/19/18 on evaluation today patient presents for readmission concerning an issue she has been having with her left heel and left anterior lower extremity. She has a history of atrial fibrillation and had knee  surgery for a total knee replacement three weeks ago today. Subsequently since being discharged from the hospital it was noted that she did have a left heel deep tissue injury as well the left anterior lower extremity ulcer. This has been present apparently for roughly 2 weeks. She does not appear to have any evidence of infection which is good news. She has been tolerating the dressing changes that have been performed it sounds as if Santyl is what has been used at least most recently. Obviously I think this is likely a good treatment choice for her. Especially in light of the fact that the wound on the distal lower extremity of the left leg appears to show signs of significant slough/eschar buildup which is starting to loosen up now that she is using the central but prior was fairly significant as far as how hard and black it was. She does have pain at both sites the open wound more than the deep tissue injury. Otherwise the patient does have a history of peripheral vascular disease, congestive heart failure, chronic kidney disease stage IV, and now the presence of a left artificial knee joint. 03/28/18 on evaluation today patient appears to be doing a little worse in regard to the dorsal surface of her lower extremity. She has been tolerating the dressing changes without complication although she states there's a lot of pain at this location. Subsequently in regard to her left heel the show signs of continued to drain with maceration noted. I think that some of the dead tissue needs to be removed from the surface of the wound at the site to allow it  to heal appropriately. 04/02/18 on evaluation today patient's wounds actually appear to be doing better at this point in regard to the dorsal wound on the anterior lower leg as well as the heel ulcer. She does have an area of thicker eschar of the heel which I think actually needs to be loosened up I think cental or Iodoflex could be beneficial in this regard. Otherwise debridement will likely be undertaken today over the lower leg region in order to clear away some of the necrotic material. 04/09/18 on evaluation today a lot of the skin which is around the patient's heel ulcer actually is lifting off which is good news. There is some Slough noted on the surface of one centrally this is going to require sharp debridement. The dorsal surface of her foot wound actually appears to be doing much better which is great news. Overall I'm fairly pleased with how things seem to be progressing. The patient likewise is worried about her heel but otherwise feels like things are going fairly well. Electronic Signature(s) Signed: 04/09/2018 1:28:21 PM By: Worthy Keeler PA-C Entered By: Worthy Keeler on 04/09/2018 11:11:26 [name], [name] ([id number]) [name], [name] ([id number]) -------------------------------------------------------------------------------- Physical Exam Details Patient Name: [name] Date of Service: 04/09/2018 9:45 AM Medical Record Number: [id number] Patient Account Number: [id number] Date of Birth/Sex: 12-23-1932 (85 y.o. F) Treating RN: Montey Hora Primary Care Provider: Emily Filbert Other Clinician: Referring Provider: Emily Filbert Treating Provider/Extender: Melburn Hake, HOYT Weeks in Treatment: 3 Constitutional Well-nourished and well-hydrated in no acute distress. Respiratory normal breathing without difficulty. Psychiatric this patient is able to make decisions and demonstrates good insight into disease process. Alert and Oriented x 3. pleasant and  cooperative. Notes Patient's wound bed currently in regard to the heel did require sharp debridement. This was performed without complication and post debridement the wound bed appears  to be doing much better fortunately there is no sign of infection at this time. There still Slough noted fortunately she was able to get the [place] and I think this will be beneficial as far as helping the area to heal appropriately. We will hold off on the Iodoflex for now. Electronic Signature(s) Signed: 04/09/2018 1:28:21 PM By: Worthy Keeler PA-C Entered By: Worthy Keeler on 04/09/2018 11:11:51 [name], [name] ([id number]) -------------------------------------------------------------------------------- Physician Orders Details Patient Name: [name] Date of Service: 04/09/2018 9:45 AM Medical Record Number: [id number] Patient Account Number: [id number] Date of Birth/Sex: Jan 09, 1933 (85 y.o. F) Treating RN: Montey Hora Primary Care Provider: Emily Filbert Other Clinician: Referring Provider: Emily Filbert Treating Provider/Extender: Melburn Hake, HOYT Weeks in Treatment: 3 Verbal / Phone Orders: No Diagnosis Coding ICD-10 Coding Code Description [phone] Unspecified open wound, left lower leg, initial encounter L97.822 Non-pressure chronic ulcer of other part of left lower leg with fat layer exposed L89.626 Pressure-induced deep tissue damage of left heel I73.9 Peripheral vascular disease, unspecified I50.42 Chronic combined systolic (congestive) and diastolic (congestive) heart failure N18.4 Chronic kidney disease, stage 4 (severe) G64.403 Presence of left artificial knee joint Wound Cleansing Wound #15 Left,Distal,Anterior Lower Leg o Cleanse wound with mild soap and water Wound #16 Left Calcaneus o Cleanse wound with mild soap and water Primary Wound Dressing Wound #15 Left,Distal,Anterior Lower Leg o Santyl Ointment Wound #16 Left Calcaneus o Santyl Ointment Secondary  Dressing Wound #15 Left,Distal,Anterior Lower Leg o Gauze, ABD and Kerlix/Conform - secure with netting Wound #16 Left Calcaneus o Gauze, ABD and Kerlix/Conform - secure with netting Dressing Change Frequency Wound #15 Left,Distal,Anterior Lower Leg o Change dressing every day. Wound #16 Left Calcaneus o Change dressing every day. Follow-up Appointments Wound #15 Left,Distal,Anterior Lower Leg o Return Appointment in 2 weeks. [name], [name] ([id number]) Wound #16 Left Calcaneus o Return Appointment in 2 weeks. Off-Loading Wound #16 Left Calcaneus o Other: - Please continue wearing your boot to protect your heel Electronic Signature(s) Signed: 04/09/2018 1:28:21 PM By: Worthy Keeler PA-C Signed: 04/09/2018 1:47:23 PM By: Montey Hora Entered By: Montey Hora on 04/09/2018 10:43:00 [name], [name] ([id number]) -------------------------------------------------------------------------------- Problem List Details Patient Name: [name]. Date of Service: 04/09/2018 9:45 AM Medical Record Number: [id number] Patient Account Number: [id number] Date of Birth/Sex: [birth year] (85 y.o. F) Treating RN: Montey Hora Primary Care Provider: Emily Filbert Other Clinician: Referring Provider: Emily Filbert Treating Provider/Extender: Melburn Hake, HOYT Weeks in Treatment: 3 Active Problems ICD-10 Evaluated Encounter Code Description Active Date Today Diagnosis S81.802A Unspecified open wound, left lower leg, initial encounter 03/19/2018 No Yes L97.822 Non-pressure chronic ulcer of other part of left lower leg with 03/19/2018 No Yes fat layer exposed L89.626 Pressure-induced deep tissue damage of left heel 03/19/2018 No Yes I73.9 Peripheral vascular disease, unspecified 03/19/2018 No Yes I50.42 Chronic combined systolic (congestive) and diastolic 63/0/1601 No Yes (congestive) heart failure N18.4 Chronic kidney disease, stage 4 (severe) 03/19/2018 No Yes Z96.652 Presence of left  artificial knee joint 03/19/2018 No Yes Inactive Problems Resolved Problems Electronic Signature(s) Signed: 04/09/2018 1:28:21 PM By: Worthy Keeler PA-C Entered By: Worthy Keeler on 04/09/2018 09:58:42 [name], [name] ([id number]) -------------------------------------------------------------------------------- Progress Note/History and Physical Details Patient Name: [name]. Date of Service: 04/09/2018 9:45 AM Medical Record Number: [id number] Patient Account Number: [id number] Date of Birth/Sex: 12/10/1932 (85 y.o. F) Treating RN: Montey Hora Primary Care Provider: Emily Filbert Other Clinician: Referring Provider: Emily Filbert Treating Provider/Extender: STONE III, HOYT Weeks in  Treatment: 3 Subjective Chief Complaint Information obtained from Patient Left heel DTI and left LE ulcer History of Present Illness (HPI) 84 year old patient who is known to the wound center for several years now has a new injury to the right lower extremity where she bumped herself against the bath tub. He also has had a chronic right lateral malleolus open wound which has been coming on and off for several years now. This has been there for at least 2014 and was healed out in November 2016. In the past she has been seen by her cardiologist Dr. Kathlyn Sacramento in April 2016. Noninvasive vascular evaluation showed an ABI of 0.68 on the right and 0.85 on the left. Angiography in [name] 2016 showed moderate nonobstructive disease affecting the distal right SFA and popliteal artery. One-vessel runoff below the knee via the peroneal artery with short occlusion of the anterior tibial artery and reconstitution. No intervention performed. Most recently she was seen for a wound on her right lower extremity which was healed out by March 15 She is not a smoker and has had no diabetes mellitus. 07/29/2015 -- the appointment with her cardiologist Dr. Fletcher Anon is still pending for review of her arterial duplex  studies. Readmission: 03/19/18 on evaluation today patient presents for readmission concerning an issue she has been having with her left heel and left anterior lower extremity. She has a history of atrial fibrillation and had knee surgery for a total knee replacement three weeks ago today. Subsequently since being discharged from the hospital it was noted that she did have a left heel deep tissue injury as well the left anterior lower extremity ulcer. This has been present apparently for roughly 2 weeks. She does not appear to have any evidence of infection which is good news. She has been tolerating the dressing changes that have been performed it sounds as if Santyl is what has been used at least most recently. Obviously I think this is likely a good treatment choice for her. Especially in light of the fact that the wound on the distal lower extremity of the left leg appears to show signs of significant slough/eschar buildup which is starting to loosen up now that she is using the central but prior was fairly significant as far as how hard and black it was. She does have pain at both sites the open wound more than the deep tissue injury. Otherwise the patient does have a history of peripheral vascular disease, congestive heart failure, chronic kidney disease stage IV, and now the presence of a left artificial knee joint. 03/28/18 on evaluation today patient appears to be doing a little worse in regard to the dorsal surface of her lower extremity. She has been tolerating the dressing changes without complication although she states there's a lot of pain at this location. Subsequently in regard to her left heel the show signs of continued to drain with maceration noted. I think that some of the dead tissue needs to be removed from the surface of the wound at the site to allow it to heal appropriately. 04/02/18 on evaluation today patient's wounds actually appear to be doing better at this point in  regard to the dorsal wound on the anterior lower leg as well as the heel ulcer. She does have an area of thicker eschar of the heel which I think actually needs to be loosened up I think cental or Iodoflex could be beneficial in this regard. Otherwise debridement will likely be undertaken today over the lower  leg region in order to clear away some of the necrotic material. 04/09/18 on evaluation today a lot of the skin which is around the patient's heel ulcer actually is lifting off which is good news. There is some Slough noted on the surface of one centrally this is going to require sharp debridement. The dorsal surface of her foot wound actually appears to be doing much better which is great news. Overall I'm fairly pleased with how things seem to be progressing. The patient likewise is worried about her heel but otherwise feels like things are going fairly well. CHERI, [name] ([id number]) Wound History Patient presents with 1 open wound that has been present for approximately 2 weeks. Laboratory tests have been performed in the last month. Patient reportedly has tested positive for an antibiotic resistant organism. Patient reportedly has not tested positive for osteomyelitis. Patient reportedly has not had testing performed to evaluate circulation in the legs. Patient History Information obtained from Patient. Family History Cancer - Siblings, Heart Disease - Father, Hypertension - Siblings, Stroke - Mother, No family history of Hereditary Spherocytosis, Kidney Disease, Lung Disease, Seizures, Thyroid Problems, Tuberculosis. Social History Never smoker, Marital Status - Married, Alcohol Use - Never, Drug Use - No History, Caffeine Use - Daily. Medical History Eyes Patient has history of Cataracts Denies history of Glaucoma, Optic Neuritis Ear/Nose/Mouth/Throat Denies history of Chronic sinus problems/congestion, Middle ear problems Hematologic/Lymphatic Patient has history of  Anemia Denies history of Hemophilia, Human Immunodeficiency Virus, Lymphedema, Sickle Cell Disease Respiratory Denies history of Aspiration, Asthma, Chronic Obstructive Pulmonary Disease (COPD), Pneumothorax, Sleep Apnea, Tuberculosis Cardiovascular Patient has history of Arrhythmia - A fib, Hypertension Denies history of Angina, Congestive Heart Failure, Coronary Artery Disease, Hypotension, Myocardial Infarction, Peripheral Arterial Disease, Peripheral Venous Disease, Phlebitis, Vasculitis Gastrointestinal Denies history of Cirrhosis , Colitis, Crohn s, Hepatitis A, Hepatitis B, Hepatitis C Endocrine Denies history of Type I Diabetes, Type II Diabetes Genitourinary Denies history of End Stage Renal Disease Immunological Denies history of Lupus Erythematosus, Raynaud s, Scleroderma Integumentary (Skin) Denies history of History of Burn, History of pressure wounds Musculoskeletal Patient has history of Osteoarthritis Denies history of Gout, Rheumatoid Arthritis Neurologic Denies history of Dementia, Neuropathy, Quadriplegia, Paraplegia, Seizure Disorder Oncologic Patient has history of Received Radiation Denies history of Received Chemotherapy Psychiatric Denies history of Anorexia/bulimia, Confinement Anxiety Hospitalization/Surgery History - 09/16/2014, [facility], (R) knee replacement. Medical And Surgical History Notes Ear/Nose/Mouth/Throat HOH- hearing aids Genitourinary frequent UTIs [name], [name] ([id number]) Oncologic Breast Ca- R mastectomy Review of Systems (ROS) Constitutional Symptoms (General Health) Denies complaints or symptoms of Fever, Chills. Respiratory The patient has no complaints or symptoms. Cardiovascular The patient has no complaints or symptoms. Psychiatric The patient has no complaints or symptoms. Objective Constitutional Well-nourished and well-hydrated in no acute distress. Vitals Time Taken: 10:05 AM, Height: 68 in, Weight: 121 lbs, BMI: 18.4,  Temperature: 98.0 F, Pulse: 78 bpm, Respiratory Rate: 16 breaths/min, Blood Pressure: 125/68 mmHg. Respiratory normal breathing without difficulty. Psychiatric this patient is able to make decisions and demonstrates good insight into disease process. Alert and Oriented x 3. pleasant and cooperative. General Notes: Patient's wound bed currently in regard to the heel did require sharp debridement. This was performed without complication and post debridement the wound bed appears to be doing much better fortunately there is no sign of infection at this time. There still Slough noted fortunately she was able to get the [place] and I think this will be beneficial as far as helping the area to  heal appropriately. We will hold off on the Iodoflex for now. Integumentary (Hair, Skin) Wound #15 status is Open. Original cause of wound was Shear/Friction. The wound is located on the [facility] Lower Leg. The wound measures 4cm length x 3.7cm width x 0.2cm depth; 11.624cm^2 area and 2.325cm^3 volume. There is Fat Layer (Subcutaneous Tissue) Exposed exposed. There is no tunneling or undermining noted. There is a large amount of serosanguineous drainage noted. The wound margin is flat and intact. There is no granulation within the wound bed. There is a large (67-100%) amount of necrotic tissue within the wound bed including Eschar and Adherent Slough. The periwound skin appearance exhibited: Maceration, Erythema. The periwound skin appearance did not exhibit: Callus, Crepitus, Excoriation, Induration, Rash, Scarring, Dry/Scaly, Atrophie Blanche, Cyanosis, Ecchymosis, Hemosiderin Staining, Mottled, Pallor, Rubor. The surrounding wound skin color is noted with erythema which is circumferential. Periwound temperature was noted as No Abnormality. The periwound has tenderness on palpation. Wound #16 status is Open. Original cause of wound was Pressure Injury. The wound is located on the Left Calcaneus.  The wound measures 4.3cm length x 5cm width x 0.1cm depth; 16.886cm^2 area and 1.689cm^3 volume. There is Fat Layer (Subcutaneous Tissue) Exposed exposed. There is no tunneling or undermining noted. There is a medium amount of serous drainage noted. The wound margin is indistinct and nonvisible. There is small (1-33%) pale granulation within the wound bed. There is a medium (34-66%) amount of necrotic tissue within the wound bed including Eschar and Adherent Slough. The MAYCEL, RIFFE ([id number]) periwound skin appearance did not exhibit: Callus, Crepitus, Excoriation, Induration, Rash, Scarring, Dry/Scaly, Maceration, Atrophie Blanche, Cyanosis, Ecchymosis, Hemosiderin Staining, Mottled, Pallor, Rubor, Erythema. Periwound temperature was noted as No Abnormality. The periwound has tenderness on palpation. Assessment Active Problems ICD-10 Unspecified open wound, left lower leg, initial encounter Non-pressure chronic ulcer of other part of left lower leg with fat layer exposed Pressure-induced deep tissue damage of left heel Peripheral vascular disease, unspecified Chronic combined systolic (congestive) and diastolic (congestive) heart failure Chronic kidney disease, stage 4 (severe) Presence of left artificial knee joint Procedures Wound #16 Pre-procedure diagnosis of Wound #16 is a Pressure Ulcer located on the Left Calcaneus . There was a Excisional Skin/Subcutaneous Tissue Debridement with a total area of 21.5 sq cm performed by STONE III, HOYT E., PA-C. With the following instrument(s): Curette to remove Viable and Non-Viable tissue/material. Material removed includes Subcutaneous Tissue and Slough and after achieving pain control using Lidocaine 4% Topical Solution. No specimens were taken. A time out was conducted at 10:42, prior to the start of the procedure. A Minimum amount of bleeding was controlled with Pressure. The procedure was tolerated well with a pain level of 0 throughout and  a pain level of 0 following the procedure. Post Debridement Measurements: 4.3cm length x 5cm width x 0.3cm depth; 5.066cm^3 volume. Post debridement Stage noted as Category/Stage III. Character of Wound/Ulcer Post Debridement is improved. Post procedure Diagnosis Wound #16: Same as Pre-Procedure Plan Wound Cleansing: Wound #15 Left,Distal,Anterior Lower Leg: Cleanse wound with mild soap and water Wound #16 Left Calcaneus: Cleanse wound with mild soap and water Primary Wound Dressing: Wound #15 Left,Distal,Anterior Lower Leg: Santyl Ointment Wound #16 Left Calcaneus: Santyl Ointment Secondary Dressing: Wound #15 Left,Distal,Anterior Lower Leg: Mickley, Jacquel A. ([id number]) Gauze, ABD and Kerlix/Conform - secure with netting Wound #16 Left Calcaneus: Gauze, ABD and Kerlix/Conform - secure with netting Dressing Change Frequency: Wound #15 Left,Distal,Anterior Lower Leg: Change dressing every day. Wound #16 Left Calcaneus: Change  dressing every day. Follow-up Appointments: Wound #15 Left,Distal,Anterior Lower Leg: Return Appointment in 2 weeks. Wound #16 Left Calcaneus: Return Appointment in 2 weeks. Off-Loading: Wound #16 Left Calcaneus: Other: - Please continue wearing your boot to protect your heel I'm gonna recommend again that we continue with the Santyl we will subsequently see were things stand at follow-up. If anything changes or worsens she will let me know. Please see above for specific wound care orders. We will see patient for re-evaluation in 2 week(s) here in the clinic. If anything worsens or changes patient will contact our office for additional recommendations. Electronic Signature(s) Signed: 04/09/2018 1:28:21 PM By: Worthy Keeler PA-C Entered By: Worthy Keeler on 04/09/2018 11:12:18 Mcsorley, [name] ([id number]) -------------------------------------------------------------------------------- ROS/PFSH Details Patient Name: [name] Date of Service: 04/09/2018  9:45 AM Medical Record Number: [id number] Patient Account Number: [id number] Date of Birth/Sex: [birth year] (85 y.o. F) Treating RN: Montey Hora Primary Care Provider: Emily Filbert Other Clinician: Referring Provider: Emily Filbert Treating Provider/Extender: Melburn Hake, HOYT Weeks in Treatment: 3 Label Progress Note Print Version as History and Physical for this encounter Information Obtained From Patient Wound History Do you currently have one or more open woundso Yes How many open wounds do you currently haveo 1 Approximately how long have you had your woundso 2 weeks Have you had any lab work done in the past montho Yes Have you tested positive for osteomyelitis (bone infection)o No Have you had any tests for circulation on your legso No Constitutional Symptoms (General Health) Complaints and Symptoms: Negative for: Fever; Chills Eyes Medical History: Positive for: Cataracts Negative for: Glaucoma; Optic Neuritis Ear/Nose/Mouth/Throat Medical History: Negative for: Chronic sinus problems/congestion; Middle ear problems Past Medical History Notes: HOH- hearing aids Hematologic/Lymphatic Medical History: Positive for: Anemia Negative for: Hemophilia; Human Immunodeficiency Virus; Lymphedema; Sickle Cell Disease Respiratory Complaints and Symptoms: No Complaints or Symptoms Medical History: Negative for: Aspiration; Asthma; Chronic Obstructive Pulmonary Disease (COPD); Pneumothorax; Sleep Apnea; Tuberculosis Cardiovascular Complaints and Symptoms: No Complaints or Symptoms Carbonneau, YNEZ EUGENIO. ([id number]) Medical History: Positive for: Arrhythmia - A fib; Hypertension Negative for: Angina; Congestive Heart Failure; Coronary Artery Disease; Hypotension; Myocardial Infarction; Peripheral Arterial Disease; Peripheral Venous Disease; Phlebitis; Vasculitis Gastrointestinal Medical History: Negative for: Cirrhosis ; Colitis; Crohnos; Hepatitis A; Hepatitis B; Hepatitis  C Endocrine Medical History: Negative for: Type I Diabetes; Type II Diabetes Genitourinary Medical History: Negative for: End Stage Renal Disease Past Medical History Notes: frequent UTIs Immunological Medical History: Negative for: Lupus Erythematosus; Raynaudos; Scleroderma Integumentary (Skin) Medical History: Negative for: History of Burn; History of pressure wounds Musculoskeletal Medical History: Positive for: Osteoarthritis Negative for: Gout; Rheumatoid Arthritis Neurologic Medical History: Negative for: Dementia; Neuropathy; Quadriplegia; Paraplegia; Seizure Disorder Oncologic Medical History: Positive for: Received Radiation Negative for: Received Chemotherapy Past Medical History Notes: Breast Ca- R mastectomy Psychiatric Complaints and Symptoms: No Complaints or Symptoms Medical History: Negative for: Anorexia/bulimia; Confinement Anxiety Distel, Lynita A. ([id number]) HBO Extended History Items Eyes: Cataracts Immunizations Pneumococcal Vaccine: Received Pneumococcal Vaccination: No Tetanus Vaccine: Last tetanus shot: 08/15/2008 Implantable Devices Hospitalization / Surgery History Name of Hospital Purpose of Hospitalization/Surgery Date [facility] (R) knee replacement 09/16/2014 Family and Social History Cancer: Yes - Siblings; Heart Disease: Yes - Father; Hereditary Spherocytosis: No; Hypertension: Yes - Siblings; Kidney Disease: No; Lung Disease: No; Seizures: No; Stroke: Yes - Mother; Thyroid Problems: No; Tuberculosis: No; Never smoker; Marital Status - Married; Alcohol Use: Never; Drug Use: No History; Caffeine Use: Daily; Financial Concerns: No; Food, Games developer or Shelter  Needs: No; Support System Lacking: No; Transportation Concerns: No; Advanced Directives: Yes (Not Provided); Patient does not want information on Advanced Directives; Do not resuscitate: No; Living Will: Yes (Not Provided); Medical Power of Attorney: Yes (Not Provided) Physician Affirmation I  have reviewed and agree with the above information. Electronic Signature(s) Signed: 04/09/2018 1:28:21 PM By: Worthy Keeler PA-C Signed: 04/09/2018 1:47:23 PM By: Montey Hora Entered By: Worthy Keeler on 04/09/2018 11:11:38 Hassey, [name] ([id number]) -------------------------------------------------------------------------------- SuperBill Details Patient Name: [name]. Date of Service: 04/09/2018 Medical Record Number: [id number] Patient Account Number: [id number] Date of Birth/Sex: [birth year] (84 y.o. F) Treating RN: Montey Hora Primary Care Provider: Emily Filbert Other Clinician: Referring Provider: Emily Filbert Treating Provider/Extender: Melburn Hake, HOYT Weeks in Treatment: 3 Diagnosis Coding ICD-10 Codes Code Description [phone] Unspecified open wound, left lower leg, initial encounter L97.822 Non-pressure chronic ulcer of other part of left lower leg with fat layer exposed L89.626 Pressure-induced deep tissue damage of left heel I73.9 Peripheral vascular disease, unspecified I50.42 Chronic combined systolic (congestive) and diastolic (congestive) heart failure N18.4 Chronic kidney disease, stage 4 (severe) E49.753 Presence of left artificial knee joint Facility Procedures CPT4 Code: 00511021 Description: 11042 - DEB SUBQ TISSUE 20 SQ CM/< ICD-10 Diagnosis Description L89.626 Pressure-induced deep tissue damage of left heel Modifier: Quantity: 1 CPT4 Code: 11735670 Description: 11045 - DEB SUBQ TISS EA ADDL 20CM ICD-10 Diagnosis Description L89.626 Pressure-induced deep tissue damage of left heel Modifier: Quantity: 1 Physician Procedures CPT4 Code: 1410301 Description: 11042 - WC PHYS SUBQ TISS 20 SQ CM ICD-10 Diagnosis Description L89.626 Pressure-induced deep tissue damage of left heel Modifier: Quantity: 1 CPT4 Code: 3143888 Description: 11045 - WC PHYS SUBQ TISS EA ADDL 20 CM ICD-10 Diagnosis Description L89.626 Pressure-induced deep tissue damage of left  heel Modifier: Quantity: 1 Electronic Signature(s) Signed: 04/09/2018 1:28:21 PM By: Worthy Keeler PA-C Entered By: Worthy Keeler on 04/09/2018 11:12:38

## 2018-04-12 ENCOUNTER — Other Ambulatory Visit: Payer: Self-pay | Admitting: Adult Health

## 2018-04-16 ENCOUNTER — Other Ambulatory Visit: Payer: Self-pay | Admitting: Adult Health

## 2018-04-23 ENCOUNTER — Encounter: Payer: Medicare HMO | Attending: Physician Assistant | Admitting: Physician Assistant

## 2018-04-23 DIAGNOSIS — Z9011 Acquired absence of right breast and nipple: Secondary | ICD-10-CM | POA: Insufficient documentation

## 2018-04-23 DIAGNOSIS — Z923 Personal history of irradiation: Secondary | ICD-10-CM | POA: Diagnosis not present

## 2018-04-23 DIAGNOSIS — Z96652 Presence of left artificial knee joint: Secondary | ICD-10-CM | POA: Diagnosis not present

## 2018-04-23 DIAGNOSIS — L97422 Non-pressure chronic ulcer of left heel and midfoot with fat layer exposed: Secondary | ICD-10-CM | POA: Diagnosis not present

## 2018-04-23 DIAGNOSIS — N184 Chronic kidney disease, stage 4 (severe): Secondary | ICD-10-CM | POA: Insufficient documentation

## 2018-04-23 DIAGNOSIS — I129 Hypertensive chronic kidney disease with stage 1 through stage 4 chronic kidney disease, or unspecified chronic kidney disease: Secondary | ICD-10-CM | POA: Insufficient documentation

## 2018-04-23 DIAGNOSIS — I739 Peripheral vascular disease, unspecified: Secondary | ICD-10-CM | POA: Insufficient documentation

## 2018-04-23 DIAGNOSIS — Z853 Personal history of malignant neoplasm of breast: Secondary | ICD-10-CM | POA: Insufficient documentation

## 2018-04-23 DIAGNOSIS — L97822 Non-pressure chronic ulcer of other part of left lower leg with fat layer exposed: Secondary | ICD-10-CM | POA: Diagnosis not present

## 2018-04-24 NOTE — Progress Notes (Signed)
GENE, COLEE ([id number]) Visit Report for 04/23/2018 Arrival Information Details Patient Name: [name], [name]. Date of Service: 04/23/2018 12:30 PM Medical Record Number: [id number] Patient Account Number: [id number] Date of Birth/Sex: 05/19/1932 (85 y.o. F) Treating RN: Secundino Ginger Primary Care Miyeko Mahlum: Emily Filbert Other Clinician: Referring Kelsei Defino: Emily Filbert Treating Laurelyn Terrero/Extender: Melburn Hake, HOYT Weeks in Treatment: 5 Visit Information History Since Last Visit Added or deleted any medications: No Patient Arrived: Ambulatory Any new allergies or adverse reactions: No Arrival Time: 12:36 Had a fall or experienced change in No Accompanied By: husband activities of daily living that may affect Transfer Assistance: None risk of falls: Patient Identification Verified: Yes Signs or symptoms of abuse/neglect since last visito No Secondary Verification Process Yes Hospitalized since last visit: No Completed: Implantable device outside of the clinic excluding No Patient Has Alerts: Yes cellular tissue based products placed in the center Patient Alerts: Patient on Blood since last visit: Thinner Has Dressing in Place as Prescribed: Yes Eliquis Pain Present Now: Yes ABI 05/23/17 L.88 R 1.16 Electronic Signature(s) Signed: 04/23/2018 1:34:38 PM By: Secundino Ginger Entered By: Secundino Ginger on 04/23/2018 12:37:17 [name], [name] ([id number]) -------------------------------------------------------------------------------- Lower Extremity Assessment Details Patient Name: [name]. Date of Service: 04/23/2018 12:30 PM Medical Record Number: [id number] Patient Account Number: [id number] Date of Birth/Sex: January 30, 1933 (85 y.o. F) Treating RN: Secundino Ginger Primary Care Frederich Montilla: Emily Filbert Other Clinician: Referring Elnora Quizon: Emily Filbert Treating Lucus Lambertson/Extender: Melburn Hake, HOYT Weeks in Treatment: 5 Vascular Assessment Claudication: Claudication Assessment [Left:None] Pulses: Dorsalis  Pedis Palpable: [Left:Yes] Posterior Tibial Extremity colors, hair growth, and conditions: Extremity Color: [Left:Hyperpigmented] Hair Growth on Extremity: [Left:No] Temperature of Extremity: [Left:Warm] Capillary Refill: [Left:< 3 seconds] Toe Nail Assessment Left: Right: Thick: No Discolored: No Deformed: No Improper Length and Hygiene: No Electronic Signature(s) Signed: 04/23/2018 1:34:38 PM By: Secundino Ginger Entered By: Secundino Ginger on 04/23/2018 12:52:09 [name], [name] ([id number]) -------------------------------------------------------------------------------- Multi Latoya Chart Details Patient Name: [name]. Date of Service: 04/23/2018 12:30 PM Medical Record Number: [id number] Patient Account Number: [id number] Date of Birth/Sex: March 17, 1933 (85 y.o. F) Treating RN: Montey Hora Primary Care Lajarvis Italiano: Emily Filbert Other Clinician: Referring Earlie Arciga: Emily Filbert Treating Harrietta Incorvaia/Extender: Melburn Hake, HOYT Weeks in Treatment: 5 Vital Signs Height(in): 68 Pulse(bpm): 90 Weight(lbs): 121 Blood Pressure(mmHg): 149/41 Body Mass Index(BMI): 18 Temperature(F): 97.9 Respiratory Rate 16 (breaths/min): Photos: [15:No Photos] [16:No Photos] [N/A:N/A] Latoya Location: [15:Left Lower Leg - Anterior, Distal] [16:Left Calcaneus] [N/A:N/A] Wounding Event: [15:Shear/Friction] [16:Pressure Injury] [N/A:N/A] Primary Etiology: [15:Skin Tear] [16:Pressure Ulcer] [N/A:N/A] Comorbid History: [15:Cataracts, Anemia, Arrhythmia, Hypertension, Osteoarthritis, Received Radiation] [16:Cataracts, Anemia, Arrhythmia, Hypertension, Osteoarthritis, Received Radiation] [N/A:N/A] Date Acquired: [15:03/05/2018] [16:03/11/2018] [N/A:N/A] Weeks of Treatment: [15:5] [16:5] [N/A:N/A] Latoya Status: [15:Open] [16:Open] [N/A:N/A] Measurements L x W x D [15:4x3.5x0.2] [16:2x3.5x0.1] [N/A:N/A] (cm) Area (cm) : [15:10.996] [16:5.498] [N/A:N/A] Volume (cm) : [15:2.199] [16:0.55] [N/A:N/A] % Reduction in Area:  [15:25.50%] [16:-1067.30%] [N/A:N/A] % Reduction in Volume: [15:25.50%] [16:-1070.20%] [N/A:N/A] Classification: [15:Full Thickness Without Exposed Support Structures] [16:Category/Stage III] [N/A:N/A] Exudate Amount: [15:Large] [16:Medium] [N/A:N/A] Exudate Type: [15:Purulent] [16:Purulent] [N/A:N/A] Exudate Color: [15:yellow, brown, green] [16:yellow, brown, green] [N/A:N/A] Latoya Margin: [15:Flat and Intact] [16:Indistinct, nonvisible] [N/A:N/A] Granulation Amount: [15:None Present (0%)] [16:Small (1-33%)] [N/A:N/A] Granulation Quality: [15:N/A] [16:Pale] [N/A:N/A] Necrotic Amount: [15:Large (67-100%)] [16:Large (67-100%)] [N/A:N/A] Necrotic Tissue: [15:Eschar, Adherent Slough] [16:Eschar, Adherent Slough] [N/A:N/A] Exposed Structures: [15:Fat Layer (Subcutaneous Tissue) Exposed: Yes Fascia: No Tendon: No Muscle: No Joint: No Bone: No] [16:Fat Layer (Subcutaneous Tissue) Exposed: Yes Fascia: No Tendon: No Muscle: No Joint:  No Bone: No] [N/A:N/A] Epithelialization: [15:None] [16:Small (1-33%)] [N/A:N/A] Periwound Skin Texture: [N/A:N/A] Excoriation: No Excoriation: No Induration: No Induration: No Callus: No Callus: No Crepitus: No Crepitus: No Rash: No Rash: No Scarring: No Scarring: No Periwound Skin Moisture: Maceration: Yes Maceration: No N/A Dry/Scaly: No Dry/Scaly: No Periwound Skin Color: Erythema: Yes Atrophie Blanche: No N/A Atrophie Blanche: No Cyanosis: No Cyanosis: No Ecchymosis: No Ecchymosis: No Erythema: No Hemosiderin Staining: No Hemosiderin Staining: No Mottled: No Mottled: No Pallor: No Pallor: No Rubor: No Rubor: No Erythema Location: Circumferential N/A N/A Temperature: No Abnormality No Abnormality N/A Tenderness on Palpation: Yes Yes N/A Latoya Preparation: Ulcer Cleansing: Ulcer Cleansing: N/A Rinsed/Irrigated with Saline Rinsed/Irrigated with Saline Topical Anesthetic Applied: Topical Anesthetic Applied: Other: lidocaine 4% Other:  lidocaine 4% Treatment Notes Electronic Signature(s) Signed: 04/23/2018 4:56:07 PM By: Montey Hora Entered By: Montey Hora on 04/23/2018 13:20:40 [name], [name] ([id number]) -------------------------------------------------------------------------------- [place] Details Patient Name: [name]. Date of Service: 04/23/2018 12:30 PM Medical Record Number: [id number] Patient Account Number: [id number] Date of Birth/Sex: Aug 12, 1932 (85 y.o. F) Treating RN: Montey Hora Primary Care Glessie Eustice: Emily Filbert Other Clinician: Referring Shalin Vonbargen: Emily Filbert Treating Jaxzen Vanhorn/Extender: Melburn Hake, HOYT Weeks in Treatment: 5 Active Inactive Abuse / Safety / Falls / Self Care Management Nursing Diagnoses: Potential for falls Goals: Patient will not develop complications from immobility Date Initiated: 03/19/2018 Target Resolution Date: 06/22/2018 Goal Status: Active Interventions: Assess fall risk on admission and as needed Notes: Nutrition Nursing Diagnoses: Potential for alteratiion in Nutrition/Potential for imbalanced nutrition Goals: Patient/caregiver agrees to and verbalizes understanding of need to use nutritional supplements and/or vitamins as prescribed Date Initiated: 03/19/2018 Target Resolution Date: 06/22/2018 Goal Status: Active Interventions: Assess patient nutrition upon admission and as needed per policy Notes: Orientation to the [facility] Nursing Diagnoses: Knowledge deficit related to the [facility] program Goals: Patient/caregiver will verbalize understanding of the [facility] Date Initiated: 03/19/2018 Target Resolution Date: 06/22/2018 Goal Status: Active Interventions: Provide education on orientation to the [facility] [name], [name]. ([id number]) Notes: Pressure Nursing Diagnoses: Potential for impaired tissue integrity related to pressure, friction, moisture, and shear Goals: Patient will remain free  from development of additional pressure ulcers Date Initiated: 03/19/2018 Target Resolution Date: 06/22/2018 Goal Status: Active Interventions: Assess potential for pressure ulcer upon admission and as needed Provide education on pressure ulcers Notes: Latoya/Skin Impairment Nursing Diagnoses: Impaired tissue integrity Goals: Ulcer/skin breakdown will heal within 14 weeks Date Initiated: 03/19/2018 Target Resolution Date: 06/22/2018 Goal Status: Active Interventions: Assess patient/caregiver ability to obtain necessary supplies Assess patient/caregiver ability to perform ulcer/skin care regimen upon admission and as needed Assess ulceration(s) every visit Notes: Electronic Signature(s) Signed: 04/23/2018 4:56:07 PM By: Montey Hora Entered By: Montey Hora on 04/23/2018 13:20:32 [name], [name] ([id number]) -------------------------------------------------------------------------------- Pain Assessment Details Patient Name: [name]. Date of Service: 04/23/2018 12:30 PM Medical Record Number: [id number] Patient Account Number: [id number] Date of Birth/Sex: 08/19/1932 (85 y.o. F) Treating RN: Secundino Ginger Primary Care Diella Gillingham: Emily Filbert Other Clinician: Referring Almena Hokenson: Emily Filbert Treating Demani Mcbrien/Extender: Melburn Hake, HOYT Weeks in Treatment: 5 Active Problems Location of Pain Severity and Description of Pain Patient Has Paino Yes Site Locations Rate the pain. Current Pain Level: 8 Character of Pain Describe the Pain: Burning Pain Management and Medication Current Pain Management: Notes pt C/O 8/10 pain to Latoya. enc to see primary PRN for pain management. Electronic Signature(s) Signed: 04/23/2018 1:34:38 PM By: Secundino Ginger Entered By: Secundino Ginger on  04/23/2018 12:39:13 [name], [name] ([id number]) -------------------------------------------------------------------------------- [name], GAINS. Date of Service: 04/23/2018 12:30 PM Medical  Record Number: [id number] Patient Account Number: [id number] Date of Birth/Sex: [birth year] (85 y.o. F) Treating RN: Secundino Ginger Primary Care Brycen Bean: Emily Filbert Other Clinician: Referring Bryah Ocheltree: Emily Filbert Treating Ashon Rosenberg/Extender: Melburn Hake, HOYT Weeks in Treatment: 5 Latoya Status Latoya Number: 15 Primary Skin Tear Etiology: Latoya Location: Left Lower Leg - Anterior, Distal Latoya Open Wounding Event: Shear/Friction Status: Date Acquired: 03/05/2018 Comorbid Cataracts, Anemia, Arrhythmia, Hypertension, Weeks Of Treatment: 5 History: Osteoarthritis, Received Radiation Clustered Latoya: No Photos Photo Uploaded By: Secundino Ginger on 04/23/2018 13:32:02 Latoya Measurements Length: (cm) 4 Width: (cm) 3.5 Depth: (cm) 0.2 Area: (cm) 10.996 Volume: (cm) 2.199 % Reduction in Area: 25.5% % Reduction in Volume: 25.5% Epithelialization: None Tunneling: No Undermining: No Latoya Description Full Thickness Without Exposed Support Foul Odo Classification: Structures Slough/F Latoya Margin: Flat and Intact Exudate Large Amount: Exudate Type: Purulent Exudate Color: yellow, brown, green r After Cleansing: No ibrino Yes Latoya Bed Granulation Amount: None Present (0%) Exposed Structure Necrotic Amount: Large (67-100%) Fascia Exposed: No Necrotic Quality: Eschar, Adherent Slough Fat Layer (Subcutaneous Tissue) Exposed: Yes Tendon Exposed: No Muscle Exposed: No Joint Exposed: No Bone Exposed: No [name], Latoya A. ([id number]) Periwound Skin Texture Texture Color No Abnormalities Noted: No No Abnormalities Noted: No Callus: No Atrophie Blanche: No Crepitus: No Cyanosis: No Excoriation: No Ecchymosis: No Induration: No Erythema: Yes Rash: No Erythema Location: Circumferential Scarring: No Hemosiderin Staining: No Mottled: No Moisture Pallor: No No Abnormalities Noted: No Rubor: No Dry / Scaly: No Maceration: Yes Temperature / Pain Temperature: No  Abnormality Tenderness on Palpation: Yes Latoya Preparation Ulcer Cleansing: Rinsed/Irrigated with Saline Topical Anesthetic Applied: Other: lidocaine 4%, Electronic Signature(s) Signed: 04/23/2018 1:34:38 PM By: Secundino Ginger Entered By: Secundino Ginger on 04/23/2018 12:52:34 [name], [name] ([id number]) -------------------------------------------------------------------------------- Latoya Assessment Details Patient Name: [name]. Date of Service: 04/23/2018 12:30 PM Medical Record Number: [id number] Patient Account Number: [id number] Date of Birth/Sex: 07-Oct-1932 (85 y.o. F) Treating RN: Secundino Ginger Primary Care Cardin Nitschke: Emily Filbert Other Clinician: Referring Johnika Escareno: Emily Filbert Treating Mitali Shenefield/Extender: Melburn Hake, HOYT Weeks in Treatment: 5 Latoya Status Latoya Number: 16 Primary Pressure Ulcer Etiology: Latoya Location: Left Calcaneus Latoya Open Wounding Event: Pressure Injury Status: Date Acquired: 03/11/2018 Comorbid Cataracts, Anemia, Arrhythmia, Hypertension, Weeks Of Treatment: 5 History: Osteoarthritis, Received Radiation Clustered Latoya: No Photos Photo Uploaded By: Secundino Ginger on 04/23/2018 13:32:02 Latoya Measurements Length: (cm) 2 % Reduction Width: (cm) 3.5 % Reduction Depth: (cm) 0.1 Epitheliali Area: (cm) 5.498 Tunneling: Volume: (cm) 0.55 Underminin in Area: -1067.3% in Volume: -1070.2% zation: Small (1-33%) No g: No Latoya Description Classification: Category/Stage III Foul Odor A Latoya Margin: Indistinct, nonvisible Slough/Fibr Exudate Amount: Medium Exudate Type: Purulent Exudate Color: yellow, brown, green fter Cleansing: No ino Yes Latoya Bed Granulation Amount: Small (1-33%) Exposed Structure Granulation Quality: Pale Fascia Exposed: No Necrotic Amount: Large (67-100%) Fat Layer (Subcutaneous Tissue) Exposed: Yes Necrotic Quality: Eschar, Adherent Slough Tendon Exposed: No Muscle Exposed: No Joint Exposed: No Bone Exposed: No Periwound Skin  Texture [name], [name] A. ([id number]) Texture Color No Abnormalities Noted: No No Abnormalities Noted: No Callus: No Atrophie Blanche: No Crepitus: No Cyanosis: No Excoriation: No Ecchymosis: No Induration: No Erythema: No Rash: No Hemosiderin Staining: No Scarring: No Mottled: No Pallor: No Moisture Rubor: No No Abnormalities Noted: No Dry / Scaly: No Temperature / Pain Maceration: No Temperature: No Abnormality Tenderness on Palpation: Yes  Latoya Preparation Ulcer Cleansing: Rinsed/Irrigated with Saline Topical Anesthetic Applied: Other: lidocaine 4%, Electronic Signature(s) Signed: 04/23/2018 1:34:38 PM By: Secundino Ginger Entered By: Secundino Ginger on 04/23/2018 12:50:00 Cullipher, [name] ([id number]) -------------------------------------------------------------------------------- Vitals Details Patient Name: [name]. Date of Service: 04/23/2018 12:30 PM Medical Record Number: [id number] Patient Account Number: [id number] Date of Birth/Sex: 1933-02-25 (85 y.o. F) Treating RN: Secundino Ginger Primary Care Shirlie Enck: Emily Filbert Other Clinician: Referring Muhammed Teutsch: Emily Filbert Treating Nykayla Marcelli/Extender: Melburn Hake, HOYT Weeks in Treatment: 5 Vital Signs Time Taken: 12:39 Temperature (F): 97.9 Height (in): 68 Pulse (bpm): 68 Weight (lbs): 121 Respiratory Rate (breaths/min): 16 Body Mass Index (BMI): 18.4 Blood Pressure (mmHg): 149/41 Reference Range: 80 - 120 mg / dl Notes systolic BP is low ,pt has NVC, NAD. Electronic Signature(s) Signed: 04/23/2018 1:34:38 PM By: Secundino Ginger Entered By: Secundino Ginger on 04/23/2018 12:40:59

## 2018-04-25 NOTE — Progress Notes (Signed)
CORITA, ALLINSON ([id number]) Visit Report for 04/23/2018 Chief Complaint Document Details Patient Name: [name], [name]. Date of Service: 04/23/2018 12:30 PM Medical Record Number: [id number] Patient Account Number: [id number] Date of Birth/Sex: [birth year] (85 y.o. F) Treating RN: Montey Hora Primary Care Provider: Emily Filbert Other Clinician: Referring Provider: Emily Filbert Treating Provider/Extender: Melburn Hake, HOYT Weeks in Treatment: 5 Information Obtained from: Patient Chief Complaint Left heel DTI and left LE ulcer Electronic Signature(s) Signed: 04/24/2018 8:35:34 AM By: Worthy Keeler PA-C Entered By: Worthy Keeler on 04/23/2018 13:16:52 [name], [name] ([id number]) -------------------------------------------------------------------------------- Debridement Details Patient Name: [name]. Date of Service: 04/23/2018 12:30 PM Medical Record Number: [id number] Patient Account Number: [id number] Date of Birth/Sex: 1933/03/11 (85 y.o. F) Treating RN: Montey Hora Primary Care Provider: Emily Filbert Other Clinician: Referring Provider: Emily Filbert Treating Provider/Extender: Melburn Hake, HOYT Weeks in Treatment: 5 Debridement Performed for Wound #16 Left Calcaneus Assessment: Performed By: Physician STONE III, HOYT E., PA-C Debridement Type: Debridement Level of Consciousness (Pre- Awake and Alert procedure): Pre-procedure Verification/Time Yes - 13:22 Out Taken: Start Time: 13:22 Pain Control: Lidocaine 4% Topical Solution Total Area Debrided (L x W): 2 (cm) x 3.5 (cm) = 7 (cm) Tissue and other material Viable, Non-Viable, Slough, Subcutaneous, Slough debrided: Level: Skin/Subcutaneous Tissue Debridement Description: Excisional Instrument: Curette Bleeding: Minimum Hemostasis Achieved: Pressure End Time: 13:26 Procedural Pain: 0 Post Procedural Pain: 0 Response to Treatment: Procedure was tolerated well Level of Consciousness Awake and Alert (Post-procedure): Post  Debridement Measurements of Total Wound Length: (cm) 2 Stage: Category/Stage III Width: (cm) 3.5 Depth: (cm) 0.2 Volume: (cm) 1.1 Character of Wound/Ulcer Post Improved Debridement: Post Procedure Diagnosis Same as Pre-procedure Electronic Signature(s) Signed: 04/23/2018 4:56:07 PM By: Montey Hora Signed: 04/24/2018 8:35:34 AM By: Worthy Keeler PA-C Entered By: Montey Hora on 04/23/2018 13:28:05 [name], [name] ([id number]) -------------------------------------------------------------------------------- Debridement Details Patient Name: [name]. Date of Service: 04/23/2018 12:30 PM Medical Record Number: [id number] Patient Account Number: [id number] Date of Birth/Sex: [birth year] (85 y.o. F) Treating RN: Montey Hora Primary Care Provider: Emily Filbert Other Clinician: Referring Provider: Emily Filbert Treating Provider/Extender: Melburn Hake, HOYT Weeks in Treatment: 5 Debridement Performed for Wound #15 Left,Distal,Anterior Lower Leg Assessment: Performed By: Physician STONE III, HOYT E., PA-C Debridement Type: Debridement Level of Consciousness (Pre- Awake and Alert procedure): Pre-procedure Verification/Time Yes - 13:26 Out Taken: Start Time: 13:26 Pain Control: Lidocaine 4% Topical Solution Total Area Debrided (L x W): 4 (cm) x 3.5 (cm) = 14 (cm) Tissue and other material Viable, Non-Viable, Slough, Subcutaneous, Slough debrided: Level: Skin/Subcutaneous Tissue Debridement Description: Excisional Instrument: Curette Bleeding: Minimum Hemostasis Achieved: Pressure End Time: 13:29 Procedural Pain: 0 Post Procedural Pain: 0 Response to Treatment: Procedure was tolerated well Level of Consciousness Awake and Alert (Post-procedure): Post Debridement Measurements of Total Wound Length: (cm) 4 Width: (cm) 3.5 Depth: (cm) 0.3 Volume: (cm) 3.299 Character of Wound/Ulcer Post Debridement: Improved Post Procedure Diagnosis Same as Pre-procedure Electronic  Signature(s) Signed: 04/23/2018 4:56:07 PM By: Montey Hora Signed: 04/24/2018 8:35:34 AM By: Worthy Keeler PA-C Entered By: Montey Hora on 04/23/2018 13:36:33 [name], [name] ([id number]) -------------------------------------------------------------------------------- HPI Details Patient Name: [name]. Date of Service: 04/23/2018 12:30 PM Medical Record Number: [id number] Patient Account Number: [id number] Date of Birth/Sex: 09-10-1932 (85 y.o. F) Treating RN: Montey Hora Primary Care Provider: Emily Filbert Other Clinician: Referring Provider: Emily Filbert Treating Provider/Extender: Melburn Hake, HOYT Weeks in Treatment: 5 History of Present Illness HPI Description: 85 year old patient who is known  to the wound center for several years now has a new injury to the right lower extremity where she bumped herself against the bath tub. He also has had a chronic right lateral malleolus open wound which has been coming on and off for several years now. This has been there for at least 2014 and was healed out in November 2016. In the past she has been seen by her cardiologist Dr. Kathlyn Sacramento in April 2016. Noninvasive vascular evaluation showed an ABI of 0.68 on the right and 0.85 on the left. 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 One-vessel runoff below the knee via the peroneal artery with short occlusion of the anterior tibial artery and reconstitution. No intervention performed. Most recently she was seen for a wound on her right lower extremity which was healed out by March 15 She is not a smoker and has had no diabetes mellitus. 07/29/2015 -- the appointment with her cardiologist Dr. Fletcher Anon is still pending for review of her arterial duplex studies. Readmission: 03/19/18 on evaluation today patient presents for readmission concerning an issue she has been having with her left heel and left anterior lower  extremity. She has a history of atrial fibrillation and had knee surgery for a total knee replacement three weeks ago today. Subsequently since being discharged from the hospital it was noted that she did have a left heel deep tissue injury as well the left anterior lower extremity ulcer. This has been present apparently for roughly 2 weeks. She does not appear to have any evidence of infection which is good news. She has been tolerating the dressing changes that have been performed it sounds as if Santyl is what has been used at least most recently. Obviously I think this is likely a good treatment choice for her. Especially in light of the fact that the wound on the distal lower extremity of the left leg appears to show signs of significant slough/eschar buildup which is starting to loosen up now that she is using the central but prior was fairly significant as far as how hard and black it was. She does have pain at both sites the open wound more than the deep tissue injury. Otherwise the patient does have a history of peripheral vascular disease, congestive heart failure, chronic kidney disease stage IV, and now the presence of a left artificial knee joint. 03/28/18 on evaluation today patient appears to be doing a little worse in regard to the dorsal surface of her lower extremity. She has been tolerating the dressing changes without complication although she states there's a lot of pain at this location. Subsequently in regard to her left heel the show signs of continued to drain with maceration noted. I think that some of the dead tissue needs to be removed from the surface of the wound at the site to allow it to heal appropriately. 04/02/18 on evaluation today patient's wounds actually appear to be doing better at this point in regard to the dorsal wound on the anterior lower leg as well as the heel ulcer. She does have an area of thicker eschar of the heel which I think actually needs to be  loosened up I think cental or Iodoflex could be beneficial in this regard. Otherwise debridement will likely be undertaken today over the lower leg region in order to clear away some of the necrotic material. 04/09/18 on evaluation today a lot of the skin which is around the patient's  heel ulcer actually is lifting off which is good news. There is some Slough noted on the surface of one centrally this is going to require sharp debridement. The dorsal surface of her foot wound actually appears to be doing much better which is great news. Overall I'm fairly pleased with how things seem to be progressing. The patient likewise is worried about her heel but otherwise feels like things are going fairly well. 04/23/18 on evaluation today patient appears to be doing fairly well in regard to her ulcer. She has been tolerating the dressing changes with the Santyl and overall I do believe this has been of benefit. With that being said she is still having a lot of issues with pain on the dorsal aspect of her foot although I do believe she's slowly making progress which is good news. No fevers, chills, nausea, or vomiting noted at this time. MAKAYELA, SECREST ([id number]) Electronic Signature(s) Signed: 04/24/2018 8:35:34 AM By: Worthy Keeler PA-C Entered By: Worthy Keeler on 04/23/2018 17:22:19 [name], [name] ([id number]) -------------------------------------------------------------------------------- Physical Exam Details Patient Name: [name] Date of Service: 04/23/2018 12:30 PM Medical Record Number: [id number] Patient Account Number: [id number] Date of Birth/Sex: 1932/10/01 (85 y.o. F) Treating RN: Montey Hora Primary Care Provider: Emily Filbert Other Clinician: Referring Provider: Emily Filbert Treating Provider/Extender: Melburn Hake, HOYT Weeks in Treatment: 5 Constitutional Well-nourished and well-hydrated in no acute distress. Respiratory normal breathing without difficulty. Psychiatric this patient  is able to make decisions and demonstrates good insight into disease process. Alert and Oriented x 3. pleasant and cooperative. Notes Patient's wounds did both require sharp debridement today which he tolerated without complication post debridement the wound bed appears to be doing excellent. Overall I'm very pleased with how things stand currently. Electronic Signature(s) Signed: 04/24/2018 8:35:34 AM By: Worthy Keeler PA-C Entered By: Worthy Keeler on 04/23/2018 17:22:49 [name], [name] ([id number]) -------------------------------------------------------------------------------- Physician Orders Details Patient Name: [name] Date of Service: 04/23/2018 12:30 PM Medical Record Number: [id number] Patient Account Number: [id number] Date of Birth/Sex: September 26, 1932 (85 y.o. F) Treating RN: Montey Hora Primary Care Provider: Emily Filbert Other Clinician: Referring Provider: Emily Filbert Treating Provider/Extender: Melburn Hake, HOYT Weeks in Treatment: 5 Verbal / Phone Orders: No Diagnosis Coding ICD-10 Coding Code Description S81.802A Unspecified open wound, left lower leg, initial encounter L97.822 Non-pressure chronic ulcer of other part of left lower leg with fat layer exposed L89.626 Pressure-induced deep tissue damage of left heel I73.9 Peripheral vascular disease, unspecified I50.42 Chronic combined systolic (congestive) and diastolic (congestive) heart failure N18.4 Chronic kidney disease, stage 4 (severe) E83.151 Presence of left artificial knee joint Wound Cleansing Wound #15 Left,Distal,Anterior Lower Leg o Cleanse wound with mild soap and water Wound #16 Left Calcaneus o Cleanse wound with mild soap and water Primary Wound Dressing Wound #15 Left,Distal,Anterior Lower Leg o Santyl Ointment Wound #16 Left Calcaneus o Santyl Ointment Secondary Dressing Wound #15 Left,Distal,Anterior Lower Leg o Gauze, ABD and Kerlix/Conform - secure with netting Wound #16 Left  Calcaneus o Gauze, ABD and Kerlix/Conform - secure with netting Dressing Change Frequency Wound #15 Left,Distal,Anterior Lower Leg o Change dressing every day. Wound #16 Left Calcaneus o Change dressing every day. Follow-up Appointments Wound #15 Left,Distal,Anterior Lower Leg o Return Appointment in 1 week. [name], [name] ([id number]) Wound #16 Left Calcaneus o Return Appointment in 1 week. Off-Loading Wound #16 Left Calcaneus o Other: - Please continue wearing your boot to protect your heel Patient Medications Allergies: Lipitor, Penicillins, doxycycline,  Vioxx, Cipro, amlodipine, Macrobid Notifications Medication Indication Start End Santyl 04/23/2018 DOSE topical 250 unit/gram ointment - ointment topical applied nickel thick to the wound bed and then cover with a dressing as directed Electronic Signature(s) Signed: 04/24/2018 8:35:34 AM By: Worthy Keeler PA-C Entered By: Worthy Keeler on 04/23/2018 13:46:08 Mahler, [name] ([id number]) -------------------------------------------------------------------------------- Prescription 04/23/2018 Patient Name: [name]. Provider: Worthy Keeler PA-C Date of Birth: Dec 13, 1932 NPI#: [id number] Sex: F DEA#: [id number] Phone #: [phone] License #: Patient Address: [facility] [place], [place] [address] [address] [place], [place] [address] [phone] Allergies Lipitor Reaction: joint pain Severity: Moderate Penicillins Reaction: rash Severity: Moderate doxycycline Reaction: nausea, aching, itching, shaky Severity: Moderate Vioxx Reaction: rash Cipro Reaction: nausea amlodipine Reaction: Nausea and vomiting Macrobid Reaction: rash Medication Medication: Route: Strength: Form: Santyl topical 250 unit/gram ointment Class: TOPICAL/MUCOUS MEMBR./SUBCUT. ENZYMES Dose: Indication: [name], [name] ([id number]) Frequency /  Time: ointment topical applied nickel thick to the wound bed and then cover with a dressing as directed Number of Refills: Number of Units: 0 Thirty (30) Gram(s) Generic Substitution: Start Date: End Date: Administered at Substitution Permitted 04/25/3788 Facility: No Note to Pharmacy: Signature(s): Date(s): Electronic Signature(s) Signed: 04/24/2018 8:35:34 AM By: Worthy Keeler PA-C Entered By: Worthy Keeler on 04/23/2018 13:46:08 Pry, [name] ([id number]) --------------------------------------------------------------------------------  Problem List Details Patient Name: [name]. Date of Service: 04/23/2018 12:30 PM Medical Record Number: [id number] Patient Account Number: [id number] Date of Birth/Sex: [birth year] (85 y.o. F) Treating RN: Montey Hora Primary Care Provider: Emily Filbert Other Clinician: Referring Provider: Emily Filbert Treating Provider/Extender: Melburn Hake, HOYT Weeks in Treatment: 5 Active Problems ICD-10 Evaluated Encounter Code Description Active Date Today Diagnosis S81.802A Unspecified open wound, left lower leg, initial encounter 03/19/2018 No Yes L97.822 Non-pressure chronic ulcer of other part of left lower leg with 03/19/2018 No Yes fat layer exposed L89.626 Pressure-induced deep tissue damage of left heel 03/19/2018 No Yes I73.9 Peripheral vascular disease, unspecified 03/19/2018 No Yes I50.42 Chronic combined systolic (congestive) and diastolic 19/09/2227 No Yes (congestive) heart failure N18.4 Chronic kidney disease, stage 4 (severe) 03/19/2018 No Yes Z96.652 Presence of left artificial knee joint 03/19/2018 No Yes Inactive Problems Resolved Problems Electronic Signature(s) Signed: 04/24/2018 8:35:34 AM By: Worthy Keeler PA-C Entered By: Worthy Keeler on 04/23/2018 13:16:46 Thull, [name] ([id number]) -------------------------------------------------------------------------------- Progress Note/History and Physical Details Patient Name: [name] Date of Service: 04/23/2018 12:30 PM Medical Record Number: [id number] Patient Account Number: [id number] Date of Birth/Sex: 1932/05/30 (85 y.o. F) Treating RN: Montey Hora Primary Care Provider: Emily Filbert Other Clinician: Referring Provider: Emily Filbert Treating Provider/Extender: Melburn Hake, HOYT Weeks in Treatment: 5 Subjective Chief Complaint Information obtained from Patient Left heel DTI and left LE ulcer History of Present Illness (HPI) 85 year old patient who is known to the wound center for several years now has a new injury to the right lower extremity where she bumped herself against the bath tub. He also has had a chronic right lateral malleolus open wound which has been coming on and off for several years now. This has been there for at least 2014 and was healed out in November 2016. In the past she has been seen by her cardiologist Dr. Kathlyn Sacramento in April 2016. Noninvasive vascular evaluation showed an ABI of 0.68 on the right and 0.85 on the left. 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 One-vessel runoff below the knee via the peroneal artery with short occlusion of the anterior tibial artery and reconstitution. No intervention performed. Most recently she was seen for a wound on her right lower extremity which was healed out by March 15 She is not a smoker and has had no diabetes mellitus. 07/29/2015 -- the appointment with her cardiologist Dr. Fletcher Anon is still pending for review of her arterial duplex studies. Readmission: 03/19/18 on evaluation today patient presents for readmission concerning an issue she has been having with her left heel and left anterior lower extremity. She has a history of atrial fibrillation and had knee surgery for a total knee replacement three weeks ago today. Subsequently since being discharged from the hospital it was noted that she did have a left heel  deep tissue injury as well the left anterior lower extremity ulcer. This has been present apparently for roughly 2 weeks. She does not appear to have any evidence of infection which is good news. She has been tolerating the dressing changes that have been performed it sounds as if Santyl is what has been used at least most recently. Obviously I think this is likely a good treatment choice for her. Especially in light of the fact that the wound on the distal lower extremity of the left leg appears to show signs of significant slough/eschar buildup which is starting to loosen up now that she is using the central but prior was fairly significant as far as how hard and black it was. She does have pain at both sites the open wound more than the deep tissue injury. Otherwise the patient does have a history of peripheral vascular disease, congestive heart failure, chronic kidney disease stage IV, and now the presence of a left artificial knee joint. 03/28/18 on evaluation today patient appears to be doing a little worse in regard to the dorsal surface of her lower extremity. She has been tolerating the dressing changes without complication although she states there's a lot of pain at this location. Subsequently in regard to her left heel the show signs of continued to drain with maceration noted. I think that some of the dead tissue needs to be removed from the surface of the wound at the site to allow it to heal appropriately. 04/02/18 on evaluation today patient's wounds actually appear to be doing better at this point in regard to the dorsal wound on the anterior lower leg as well as the heel ulcer. She does have an area of thicker eschar of the heel which I think actually needs to be loosened up I think cental or Iodoflex could be beneficial in this regard. Otherwise debridement will likely be undertaken today over the lower leg region in order to clear away some of the necrotic material. 04/09/18 on  evaluation today a lot of the skin which is around the patient's heel ulcer actually is lifting off which is good news. There is some Slough noted on the surface of one centrally this is going to require sharp debridement. The dorsal surface of her foot wound actually appears to be doing much better which is great news. Overall I'm fairly pleased with how things seem to be progressing. The patient likewise is worried about her heel but otherwise feels like things are going fairly well. [name], [name] ([id number]) 04/23/18 on evaluation today patient appears to be doing fairly well in regard to her ulcer. She has been tolerating the dressing changes with the Santyl and  overall I do believe this has been of benefit. With that being said she is still having a lot of issues with pain on the dorsal aspect of her foot although I do believe she's slowly making progress which is good news. No fevers, chills, nausea, or vomiting noted at this time. Wound History Patient presents with 1 open wound that has been present for approximately 2 weeks. Laboratory tests have been performed in the last month. Patient reportedly has tested positive for an antibiotic resistant organism. Patient reportedly has not tested positive for osteomyelitis. Patient reportedly has not had testing performed to evaluate circulation in the legs. Patient History Information obtained from Patient. Family History Cancer - Siblings, Heart Disease - Father, Hypertension - Siblings, Stroke - Mother, No family history of Hereditary Spherocytosis, Kidney Disease, Lung Disease, Seizures, Thyroid Problems, Tuberculosis. Social History Never smoker, Marital Status - Married, Alcohol Use - Never, Drug Use - No History, Caffeine Use - Daily. Medical History Eyes Patient has history of Cataracts Denies history of Glaucoma, Optic Neuritis Ear/Nose/Mouth/Throat Denies history of Chronic sinus problems/congestion, Middle ear  problems Hematologic/Lymphatic Patient has history of Anemia Denies history of Hemophilia, Human Immunodeficiency Virus, Lymphedema, Sickle Cell Disease Respiratory Denies history of Aspiration, Asthma, Chronic Obstructive Pulmonary Disease (COPD), Pneumothorax, Sleep Apnea, Tuberculosis Cardiovascular Patient has history of Arrhythmia - A fib, Hypertension Denies history of Angina, Congestive Heart Failure, Coronary Artery Disease, Hypotension, Myocardial Infarction, Peripheral Arterial Disease, Peripheral Venous Disease, Phlebitis, Vasculitis Gastrointestinal Denies history of Cirrhosis , Colitis, Crohn s, Hepatitis A, Hepatitis B, Hepatitis C Endocrine Denies history of Type I Diabetes, Type II Diabetes Genitourinary Denies history of End Stage Renal Disease Immunological Denies history of Lupus Erythematosus, Raynaud s, Scleroderma Integumentary (Skin) Denies history of History of Burn, History of pressure wounds Musculoskeletal Patient has history of Osteoarthritis Denies history of Gout, Rheumatoid Arthritis Neurologic Denies history of Dementia, Neuropathy, Quadriplegia, Paraplegia, Seizure Disorder Oncologic Patient has history of Received Radiation Denies history of Received Chemotherapy Psychiatric Denies history of Anorexia/bulimia, Confinement Anxiety Hospitalization/Surgery History - 09/16/2014, [facility], (R) knee replacement. [name], [name] ([id number]) Medical And Surgical History Notes Ear/Nose/Mouth/Throat HOH- hearing aids Genitourinary frequent UTIs Oncologic Breast Ca- R mastectomy Review of Systems (ROS) Constitutional Symptoms (General Health) Denies complaints or symptoms of Fever, Chills. Respiratory The patient has no complaints or symptoms. Cardiovascular The patient has no complaints or symptoms. Psychiatric The patient has no complaints or symptoms. Objective Constitutional Well-nourished and well-hydrated in no acute distress. Vitals Time Taken:  12:39 PM, Height: 68 in, Weight: 121 lbs, BMI: 18.4, Temperature: 97.9 F, Pulse: 68 bpm, Respiratory Rate: 16 breaths/min, Blood Pressure: 149/41 mmHg. General Notes: systolic BP is low ,pt has NVC, NAD. Respiratory normal breathing without difficulty. Psychiatric this patient is able to make decisions and demonstrates good insight into disease process. Alert and Oriented x 3. pleasant and cooperative. General Notes: Patient's wounds did both require sharp debridement today which he tolerated without complication post debridement the wound bed appears to be doing excellent. Overall I'm very pleased with how things stand currently. Integumentary (Hair, Skin) Wound #15 status is Open. Original cause of wound was Shear/Friction. The wound is located on the [facility] Lower Leg. The wound measures 4cm length x 3.5cm width x 0.2cm depth; 10.996cm^2 area and 2.199cm^3 volume. There is Fat Layer (Subcutaneous Tissue) Exposed exposed. There is no tunneling or undermining noted. There is a large amount of purulent drainage noted. The wound margin is flat and intact. There is no granulation within the  wound bed. There is a large (67-100%) amount of necrotic tissue within the wound bed including Eschar and Adherent Slough. The periwound skin appearance exhibited: Maceration, Erythema. The periwound skin appearance did not exhibit: Callus, Crepitus, Excoriation, Induration, Rash, Scarring, Dry/Scaly, Atrophie Blanche, Cyanosis, Ecchymosis, Hemosiderin Staining, Mottled, Pallor, Rubor. The surrounding wound skin color is noted with erythema which is circumferential. Periwound temperature was noted as No Abnormality. The periwound has tenderness on palpation. Wound #16 status is Open. Original cause of wound was Pressure Injury. The wound is located on the Left Calcaneus. The [name], [name] ([id number]) wound measures 2cm length x 3.5cm width x 0.1cm depth; 5.498cm^2 area and 0.55cm^3 volume. There  is Fat Layer (Subcutaneous Tissue) Exposed exposed. There is no tunneling or undermining noted. There is a medium amount of purulent drainage noted. The wound margin is indistinct and nonvisible. There is small (1-33%) pale granulation within the wound bed. There is a large (67-100%) amount of necrotic tissue within the wound bed including Eschar and Adherent Slough. The periwound skin appearance did not exhibit: Callus, Crepitus, Excoriation, Induration, Rash, Scarring, Dry/Scaly, Maceration, Atrophie Blanche, Cyanosis, Ecchymosis, Hemosiderin Staining, Mottled, Pallor, Rubor, Erythema. Periwound temperature was noted as No Abnormality. The periwound has tenderness on palpation. Assessment Active Problems ICD-10 Unspecified open wound, left lower leg, initial encounter Non-pressure chronic ulcer of other part of left lower leg with fat layer exposed Pressure-induced deep tissue damage of left heel Peripheral vascular disease, unspecified Chronic combined systolic (congestive) and diastolic (congestive) heart failure Chronic kidney disease, stage 4 (severe) Presence of left artificial knee joint Procedures Wound #15 Pre-procedure diagnosis of Wound #15 is a Skin Tear located on the Left,Distal,Anterior Lower Leg . There was a Excisional Skin/Subcutaneous Tissue Debridement with a total area of 14 sq cm performed by STONE III, HOYT E., PA-C. With the following instrument(s): Curette to remove Viable and Non-Viable tissue/material. Material removed includes Subcutaneous Tissue and Slough and after achieving pain control using Lidocaine 4% Topical Solution. No specimens were taken. A time out was conducted at 13:26, prior to the start of the procedure. A Minimum amount of bleeding was controlled with Pressure. The procedure was tolerated well with a pain level of 0 throughout and a pain level of 0 following the procedure. Post Debridement Measurements: 4cm length x 3.5cm width x 0.3cm depth;  3.299cm^3 volume. Character of Wound/Ulcer Post Debridement is improved. Post procedure Diagnosis Wound #15: Same as Pre-Procedure Wound #16 Pre-procedure diagnosis of Wound #16 is a Pressure Ulcer located on the Left Calcaneus . There was a Excisional Skin/Subcutaneous Tissue Debridement with a total area of 7 sq cm performed by STONE III, HOYT E., PA-C. With the following instrument(s): Curette to remove Viable and Non-Viable tissue/material. Material removed includes Subcutaneous Tissue and Slough and after achieving pain control using Lidocaine 4% Topical Solution. No specimens were taken. A time out was conducted at 13:22, prior to the start of the procedure. A Minimum amount of bleeding was controlled with Pressure. The procedure was tolerated well with a pain level of 0 throughout and a pain level of 0 following the procedure. Post Debridement Measurements: 2cm length x 3.5cm width x 0.2cm depth; 1.1cm^3 volume. Post debridement Stage noted as Category/Stage III. Character of Wound/Ulcer Post Debridement is improved. Post procedure Diagnosis Wound #16: Same as Pre-Procedure [name], ZILL ([id number]) Plan Wound Cleansing: Wound #15 Left,Distal,Anterior Lower Leg: Cleanse wound with mild soap and water Wound #16 Left Calcaneus: Cleanse wound with mild soap and water Primary Wound Dressing:  Wound #15 Left,Distal,Anterior Lower Leg: Santyl Ointment Wound #16 Left Calcaneus: Santyl Ointment Secondary Dressing: Wound #15 Left,Distal,Anterior Lower Leg: Gauze, ABD and Kerlix/Conform - secure with netting Wound #16 Left Calcaneus: Gauze, ABD and Kerlix/Conform - secure with netting Dressing Change Frequency: Wound #15 Left,Distal,Anterior Lower Leg: Change dressing every day. Wound #16 Left Calcaneus: Change dressing every day. Follow-up Appointments: Wound #15 Left,Distal,Anterior Lower Leg: Return Appointment in 1 week. Wound #16 Left Calcaneus: Return Appointment in 1  week. Off-Loading: Wound #16 Left Calcaneus: Other: - Please continue wearing your boot to protect your heel The following medication(s) was prescribed: Santyl topical 250 unit/gram ointment ointment topical applied nickel thick to the wound bed and then cover with a dressing as directed starting 04/23/2018 I'm gonna suggest that we continue with the above wound care measures for the next week. Patient is in agreement with plan. If anything changes or worsens in the meantime she will contact the office and let me know. Please see above for specific wound care orders. We will see patient for re-evaluation in 1 week(s) here in the clinic. If anything worsens or changes patient will contact our office for additional recommendations. Electronic Signature(s) Signed: 04/24/2018 8:35:34 AM By: Worthy Keeler PA-C Entered By: Worthy Keeler on 04/23/2018 17:23:04 Rosch, [name] ([id number]) -------------------------------------------------------------------------------- ROS/PFSH Details Patient Name: [name] Date of Service: 04/23/2018 12:30 PM Medical Record Number: [id number] Patient Account Number: [id number] Date of Birth/Sex: Mar 25, 1933 (85 y.o. F) Treating RN: Montey Hora Primary Care Provider: Emily Filbert Other Clinician: Referring Provider: Emily Filbert Treating Provider/Extender: Melburn Hake, HOYT Weeks in Treatment: 5 Label Progress Note Print Version as History and Physical for this encounter Information Obtained From Patient Wound History Do you currently have one or more open woundso Yes How many open wounds do you currently haveo 1 Approximately how long have you had your woundso 2 weeks Have you had any lab work done in the past montho Yes Have you tested positive for osteomyelitis (bone infection)o No Have you had any tests for circulation on your legso No Constitutional Symptoms (General Health) Complaints and Symptoms: Negative for: Fever; Chills Eyes Medical  History: Positive for: Cataracts Negative for: Glaucoma; Optic Neuritis Ear/Nose/Mouth/Throat Medical History: Negative for: Chronic sinus problems/congestion; Middle ear problems Past Medical History Notes: HOH- hearing aids Hematologic/Lymphatic Medical History: Positive for: Anemia Negative for: Hemophilia; Human Immunodeficiency Virus; Lymphedema; Sickle Cell Disease Respiratory Complaints and Symptoms: No Complaints or Symptoms Medical History: Negative for: Aspiration; Asthma; Chronic Obstructive Pulmonary Disease (COPD); Pneumothorax; Sleep Apnea; Tuberculosis Cardiovascular Complaints and Symptoms: No Complaints or Symptoms Harty, KYNADEE DAM. ([id number]) Medical History: Positive for: Arrhythmia - A fib; Hypertension Negative for: Angina; Congestive Heart Failure; Coronary Artery Disease; Hypotension; Myocardial Infarction; Peripheral Arterial Disease; Peripheral Venous Disease; Phlebitis; Vasculitis Gastrointestinal Medical History: Negative for: Cirrhosis ; Colitis; Crohnos; Hepatitis A; Hepatitis B; Hepatitis C Endocrine Medical History: Negative for: Type I Diabetes; Type II Diabetes Genitourinary Medical History: Negative for: End Stage Renal Disease Past Medical History Notes: frequent UTIs Immunological Medical History: Negative for: Lupus Erythematosus; Raynaudos; Scleroderma Integumentary (Skin) Medical History: Negative for: History of Burn; History of pressure wounds Musculoskeletal Medical History: Positive for: Osteoarthritis Negative for: Gout; Rheumatoid Arthritis Neurologic Medical History: Negative for: Dementia; Neuropathy; Quadriplegia; Paraplegia; Seizure Disorder Oncologic Medical History: Positive for: Received Radiation Negative for: Received Chemotherapy Past Medical History Notes: Breast Ca- R mastectomy Psychiatric Complaints and Symptoms: No Complaints or Symptoms Medical History: Negative for: Anorexia/bulimia; Confinement  Anxiety Cassell, Roshni A. ([id number])  HBO Extended History Items Eyes: Cataracts Immunizations Pneumococcal Vaccine: Received Pneumococcal Vaccination: No Tetanus Vaccine: Last tetanus shot: 08/15/2008 Implantable Devices Hospitalization / Surgery History Name of Hospital Purpose of Hospitalization/Surgery Date [facility] (R) knee replacement 09/16/2014 Family and Social History Cancer: Yes - Siblings; Heart Disease: Yes - Father; Hereditary Spherocytosis: No; Hypertension: Yes - Siblings; Kidney Disease: No; Lung Disease: No; Seizures: No; Stroke: Yes - Mother; Thyroid Problems: No; Tuberculosis: No; Never smoker; Marital Status - Married; Alcohol Use: Never; Drug Use: No History; Caffeine Use: Daily; Financial Concerns: No; Food, Clothing or Shelter Needs: No; Support System Lacking: No; Transportation Concerns: No; Advanced Directives: Yes (Not Provided); Patient does not want information on Advanced Directives; Do not resuscitate: No; Living Will: Yes (Not Provided); Medical Power of Attorney: Yes (Not Provided) Physician Affirmation I have reviewed and agree with the above information. Electronic Signature(s) Signed: 04/24/2018 8:35:34 AM By: Worthy Keeler PA-C Signed: 04/24/2018 5:48:26 PM By: Montey Hora Entered By: Worthy Keeler on 04/23/2018 17:22:37 Antonellis, [name] ([id number]) -------------------------------------------------------------------------------- SuperBill Details Patient Name: [name]. Date of Service: 04/23/2018 Medical Record Number: [id number] Patient Account Number: [id number] Date of Birth/Sex: Jun 02, 1932 (85 y.o. F) Treating RN: Montey Hora Primary Care Provider: Emily Filbert Other Clinician: Referring Provider: Emily Filbert Treating Provider/Extender: Melburn Hake, HOYT Weeks in Treatment: 5 Diagnosis Coding ICD-10 Codes Code Description [phone] Unspecified open wound, left lower leg, initial encounter L97.822 Non-pressure chronic ulcer of other part of left  lower leg with fat layer exposed L89.626 Pressure-induced deep tissue damage of left heel I73.9 Peripheral vascular disease, unspecified I50.42 Chronic combined systolic (congestive) and diastolic (congestive) heart failure N18.4 Chronic kidney disease, stage 4 (severe) Q11.941 Presence of left artificial knee joint Facility Procedures CPT4 Code Description: [id number] 11042 - DEB SUBQ TISSUE 20 SQ CM/< ICD-10 Diagnosis Description L97.822 Non-pressure chronic ulcer of other part of left lower leg wit L89.626 Pressure-induced deep tissue damage of left heel Modifier: h fat layer expos Quantity: 1 ed CPT4 Code Description: [id number] 11045 - DEB SUBQ TISS EA ADDL 20CM ICD-10 Diagnosis Description L97.822 Non-pressure chronic ulcer of other part of left lower leg wit L89.626 Pressure-induced deep tissue damage of left heel Modifier: h fat layer expos Quantity: 1 ed Physician Procedures CPT4 Code Description: [id number] 11042 - WC PHYS SUBQ TISS 20 SQ CM ICD-10 Diagnosis Description L97.822 Non-pressure chronic ulcer of other part of left lower leg with f L89.626 Pressure-induced deep tissue damage of left heel Modifier: at layer expos Quantity: 1 ed CPT4 Code Description: [id number] 11045 - WC PHYS SUBQ TISS EA ADDL 20 CM ICD-10 Diagnosis Description L97.822 Non-pressure chronic ulcer of other part of left lower leg with f L89.626 Pressure-induced deep tissue damage of left heel Modifier: at layer expos Quantity: 1 ed Electronic Signature(s) Signed: 04/24/2018 8:35:34 AM By: Candi Leash, Rupinder Loni Muse ([id number]) Entered By: Worthy Keeler on 04/23/2018 17:23:36

## 2018-04-27 ENCOUNTER — Other Ambulatory Visit: Payer: Self-pay | Admitting: Adult Health

## 2018-04-30 ENCOUNTER — Encounter: Payer: Medicare HMO | Admitting: Physician Assistant

## 2018-04-30 DIAGNOSIS — L97822 Non-pressure chronic ulcer of other part of left lower leg with fat layer exposed: Secondary | ICD-10-CM | POA: Diagnosis not present

## 2018-05-03 NOTE — Progress Notes (Signed)
ANGELIGUE, BOWNE ([id number]) Visit Report for 04/30/2018 Arrival Information Details Patient Name: [name], [name]. Date of Service: 04/30/2018 12:45 PM Medical Record Number: [id number] Patient Account Number: [id number] Date of Birth/Sex: 1933-03-21 (85 y.o. F) Treating RN: Harold Barban Primary Care Evangelia Whitaker: Emily Filbert Other Clinician: Referring Lashayla Armes: Emily Filbert Treating Ajahni Nay/Extender: Melburn Hake, HOYT Weeks in Treatment: 6 Visit Information History Since Last Visit Added or deleted any medications: No Patient Arrived: Ambulatory Any new allergies or adverse reactions: No Arrival Time: 12:42 Had a fall or experienced change in No Accompanied By: husband activities of daily living that may affect Transfer Assistance: None risk of falls: Patient Identification Verified: Yes Signs or symptoms of abuse/neglect since last visito No Secondary Verification Process Yes Hospitalized since last visit: No Completed: Has Dressing in Place as Prescribed: Yes Patient Has Alerts: Yes Has Compression in Place as Prescribed: No Patient Alerts: Patient on Blood Pain Present Now: Yes Thinner Eliquis ABI 05/23/17 L.88 R 1.16 Electronic Signature(s) Signed: 05/02/2018 4:21:01 PM By: Harold Barban Entered By: Harold Barban on 04/30/2018 12:43:16 Portilla, [name] ([id number]) -------------------------------------------------------------------------------- Encounter Discharge Information Details Patient Name: [name]. Date of Service: 04/30/2018 12:45 PM Medical Record Number: [id number] Patient Account Number: [id number] Date of Birth/Sex: [birth year] (85 y.o. F) Treating RN: Montey Hora Primary Care Sheronda Parran: Emily Filbert Other Clinician: Referring Maitland Lesiak: Emily Filbert Treating Amaree Leeper/Extender: Melburn Hake, HOYT Weeks in Treatment: 6 Encounter Discharge Information Items Discharge Condition: Stable Ambulatory Status: Ambulatory Discharge Destination: Home Transportation: Private  Auto Accompanied By: spouse Schedule Follow-up Appointment: Yes Clinical Summary of Care: Electronic Signature(s) Signed: 04/30/2018 5:22:36 PM By: Montey Hora Entered By: Montey Hora on 04/30/2018 13:08:27 Gainor, [name] ([id number]) -------------------------------------------------------------------------------- Lower Extremity Assessment Details Patient Name: [name]. Date of Service: 04/30/2018 12:45 PM Medical Record Number: [id number] Patient Account Number: [id number] Date of Birth/Sex: [birth year] (85 y.o. F) Treating RN: Harold Barban Primary Care Jaylyne Breese: Emily Filbert Other Clinician: Referring Bunny Lowdermilk: Emily Filbert Treating Katrenia Alkins/Extender: Melburn Hake, HOYT Weeks in Treatment: 6 Electronic Signature(s) Signed: 05/02/2018 4:21:01 PM By: Harold Barban Entered By: Harold Barban on 04/30/2018 12:51:49 Rilling, [name] ([id number]) -------------------------------------------------------------------------------- Multi Wound Chart Details Patient Name: [name]. Date of Service: 04/30/2018 12:45 PM Medical Record Number: [id number] Patient Account Number: [id number] Date of Birth/Sex: [birth year] (85 y.o. F) Treating RN: Montey Hora Primary Care Shayden Gingrich: Emily Filbert Other Clinician: Referring Devaeh Amadi: Emily Filbert Treating Alzada Brazee/Extender: Melburn Hake, HOYT Weeks in Treatment: 6 Vital Signs Height(in): 68 Pulse(bpm): 36 Weight(lbs): 121 Blood Pressure(mmHg): 126/42 Body Mass Index(BMI): 18 Temperature(F): 98.2 Respiratory Rate 18 (breaths/min): Photos: [15:No Photos] [16:No Photos] [N/A:N/A] Wound Location: [15:Left Lower Leg - Anterior, Distal] [16:Left Calcaneus] [N/A:N/A] Wounding Event: [15:Shear/Friction] [16:Pressure Injury] [N/A:N/A] Primary Etiology: [15:Skin Tear] [16:Pressure Ulcer] [N/A:N/A] Comorbid History: [15:Cataracts, Anemia, Arrhythmia, Hypertension, Osteoarthritis, Received Radiation] [16:Cataracts, Anemia, Arrhythmia, Hypertension,  Osteoarthritis, Received Radiation] [N/A:N/A] Date Acquired: [15:03/05/2018] [16:03/11/2018] [N/A:N/A] Weeks of Treatment: [15:6] [16:6] [N/A:N/A] Wound Status: [15:Open] [16:Open] [N/A:N/A] Measurements L x W x D [15:3.7x3x0.2] [16:2.5x2.2x0.1] [N/A:N/A] (cm) Area (cm) : [15:8.718] [16:4.32] [N/A:N/A] Volume (cm) : [15:1.744] [16:0.432] [N/A:N/A] % Reduction in Area: [15:41.00%] [16:-817.20%] [N/A:N/A] % Reduction in Volume: [15:40.90%] [16:-819.10%] [N/A:N/A] Classification: [15:Full Thickness Without Exposed Support Structures] [16:Category/Stage III] [N/A:N/A] Exudate Amount: [15:Large] [16:Medium] [N/A:N/A] Exudate Type: [15:Purulent] [16:Purulent] [N/A:N/A] Exudate Color: [15:yellow, brown, green] [16:yellow, brown, green] [N/A:N/A] Wound Margin: [15:Flat and Intact] [16:Indistinct, nonvisible] [N/A:N/A] Granulation Amount: [15:None Present (0%)] [16:Small (1-33%)] [N/A:N/A] Granulation Quality: [15:N/A] [16:Pale] [N/A:N/A] Necrotic Amount: [15:Large (67-100%)] [16:Large (67-100%)] [N/A:N/A] Necrotic Tissue: [  15:Adherent Slough] [16:Eschar, Adherent Slough] [N/A:N/A] Exposed Structures: [15:Fat Layer (Subcutaneous Tissue) Exposed: Yes Fascia: No Tendon: No Muscle: No Joint: No Bone: No] [16:Fat Layer (Subcutaneous Tissue) Exposed: Yes Fascia: No Tendon: No Muscle: No Joint: No Bone: No] [N/A:N/A] Epithelialization: [15:None] [16:Small (1-33%)] [N/A:N/A] Periwound Skin Texture: [N/A:N/A] Excoriation: No Excoriation: No Induration: No Induration: No Callus: No Callus: No Crepitus: No Crepitus: No Rash: No Rash: No Scarring: No Scarring: No Periwound Skin Moisture: Maceration: Yes Maceration: No N/A Dry/Scaly: No Dry/Scaly: No Periwound Skin Color: Erythema: Yes Atrophie Blanche: No N/A Atrophie Blanche: No Cyanosis: No Cyanosis: No Ecchymosis: No Ecchymosis: No Erythema: No Hemosiderin Staining: No Hemosiderin Staining: No Mottled: No Mottled: No Pallor:  No Pallor: No Rubor: No Rubor: No Erythema Location: Circumferential N/A N/A Temperature: No Abnormality No Abnormality N/A Tenderness on Palpation: Yes Yes N/A Wound Preparation: Ulcer Cleansing: Ulcer Cleansing: N/A Rinsed/Irrigated with Saline Rinsed/Irrigated with Saline Topical Anesthetic Applied: Topical Anesthetic Applied: Other: lidocaine 4% Other: lidocaine 4% Treatment Notes Electronic Signature(s) Signed: 04/30/2018 5:22:36 PM By: Montey Hora Entered By: Montey Hora on 04/30/2018 13:00:48 Broad, [name] ([id number]) -------------------------------------------------------------------------------- [place] Details Patient Name: [name]. Date of Service: 04/30/2018 12:45 PM Medical Record Number: [id number] Patient Account Number: [id number] Date of Birth/Sex: 10-23-1932 (85 y.o. F) Treating RN: Montey Hora Primary Care Smita Lesh: Emily Filbert Other Clinician: Referring Manfred Laspina: Emily Filbert Treating Keni Elison/Extender: Melburn Hake, HOYT Weeks in Treatment: 6 Active Inactive Abuse / Safety / Falls / Self Care Management Nursing Diagnoses: Potential for falls Goals: Patient will not develop complications from immobility Date Initiated: 03/19/2018 Target Resolution Date: 06/22/2018 Goal Status: Active Interventions: Assess fall risk on admission and as needed Notes: Nutrition Nursing Diagnoses: Potential for alteratiion in Nutrition/Potential for imbalanced nutrition Goals: Patient/caregiver agrees to and verbalizes understanding of need to use nutritional supplements and/or vitamins as prescribed Date Initiated: 03/19/2018 Target Resolution Date: 06/22/2018 Goal Status: Active Interventions: Assess patient nutrition upon admission and as needed per policy Notes: Orientation to the Wound Care Program Nursing Diagnoses: Knowledge deficit related to the wound healing center program Goals: Patient/caregiver will verbalize understanding of  the [place] [place] [facility] Date Initiated: 03/19/2018 Target Resolution Date: 06/22/2018 Goal Status: Active Interventions: Provide education on orientation to the wound center STORMIE, VENTOLA. ([id number]) Notes: Pressure Nursing Diagnoses: Potential for impaired tissue integrity related to pressure, friction, moisture, and shear Goals: Patient will remain free from development of additional pressure ulcers Date Initiated: 03/19/2018 Target Resolution Date: 06/22/2018 Goal Status: Active Interventions: Assess potential for pressure ulcer upon admission and as needed Provide education on pressure ulcers Notes: Wound/Skin Impairment Nursing Diagnoses: Impaired tissue integrity Goals: Ulcer/skin breakdown will heal within 14 weeks Date Initiated: 03/19/2018 Target Resolution Date: 06/22/2018 Goal Status: Active Interventions: Assess patient/caregiver ability to obtain necessary supplies Assess patient/caregiver ability to perform ulcer/skin care regimen upon admission and as needed Assess ulceration(s) every visit Notes: Electronic Signature(s) Signed: 04/30/2018 5:22:36 PM By: Montey Hora Entered By: Montey Hora on 04/30/2018 13:00:39 Natter, [name] ([id number]) -------------------------------------------------------------------------------- Pain Assessment Details Patient Name: [name]. Date of Service: 04/30/2018 12:45 PM Medical Record Number: [id number] Patient Account Number: [id number] Date of Birth/Sex: Jun 01, 1932 (85 y.o. F) Treating RN: Harold Barban Primary Care Zayden Maffei: Emily Filbert Other Clinician: Referring Sally-Anne Wamble: Emily Filbert Treating Adeja Sarratt/Extender: Melburn Hake, HOYT Weeks in Treatment: 6 Active Problems Location of Pain Severity and Description of Pain Patient Has Paino Yes Site Locations Pain Location: Pain in Ulcers With Dressing Change: Yes Duration of the Pain.  Constant / Intermittento Constant Rate the pain. Current Pain Level: 8 Worst  Pain Level: 10 Least Pain Level: 3 Pain Management and Medication Current Pain Management: Electronic Signature(s) Signed: 05/02/2018 4:21:01 PM By: Harold Barban Entered By: Harold Barban on 04/30/2018 12:44:46 Blaszczyk, [name] ([id number]) -------------------------------------------------------------------------------- Patient/Caregiver Education Details Patient Name: [name]. Date of Service: 04/30/2018 12:45 PM Medical Record Number: [id number] Patient Account Number: [id number] Date of Birth/Gender: 1932-06-10 (85 y.o. F) Treating RN: Montey Hora Primary Care Physician: Emily Filbert Other Clinician: Referring Physician: Emily Filbert Treating Physician/Extender: Sharalyn Ink in Treatment: 6 Education Assessment Education Provided To: Patient and Caregiver Education Topics Provided Wound/Skin Impairment: Handouts: Other: wound care as ordered Methods: Demonstration, Explain/Verbal Responses: State content correctly Electronic Signature(s) Signed: 04/30/2018 5:22:36 PM By: Montey Hora Entered By: Montey Hora on 04/30/2018 13:08:11 Boudoin, [name] ([id number]) -------------------------------------------------------------------------------- Wound Assessment Details Patient Name: [name]. Date of Service: 04/30/2018 12:45 PM Medical Record Number: [id number] Patient Account Number: [id number] Date of Birth/Sex: 07/11/1932 (85 y.o. F) Treating RN: Harold Barban Primary Care Nava Song: Emily Filbert Other Clinician: Referring Josede Cicero: Emily Filbert Treating Kynesha Guerin/Extender: Melburn Hake, HOYT Weeks in Treatment: 6 Wound Status Wound Number: 15 Primary Skin Tear Etiology: Wound Location: Left Lower Leg - Anterior, Distal Wound Open Wounding Event: Shear/Friction Status: Date Acquired: 03/05/2018 Comorbid Cataracts, Anemia, Arrhythmia, Hypertension, Weeks Of Treatment: 6 History: Osteoarthritis, Received Radiation Clustered Wound: No Photos Photo Uploaded  By: Harold Barban on 04/30/2018 16:30:55 Wound Measurements Length: (cm) 3.7 Width: (cm) 3 Depth: (cm) 0.2 Area: (cm) 8.718 Volume: (cm) 1.744 % Reduction in Area: 41% % Reduction in Volume: 40.9% Epithelialization: None Tunneling: No Undermining: No Wound Description Full Thickness Without Exposed Support Classification: Structures Wound Margin: Flat and Intact Exudate Large Amount: Exudate Type: Purulent Exudate Color: yellow, brown, green Foul Odor After Cleansing: No Slough/Fibrino Yes Wound Bed Granulation Amount: None Present (0%) Exposed Structure Necrotic Amount: Large (67-100%) Fascia Exposed: No Necrotic Quality: Adherent Slough Fat Layer (Subcutaneous Tissue) Exposed: Yes Tendon Exposed: No Muscle Exposed: No Joint Exposed: No Bone Exposed: No Gatt, Mikalyn A. ([id number]) Periwound Skin Texture Texture Color No Abnormalities Noted: No No Abnormalities Noted: No Callus: No Atrophie Blanche: No Crepitus: No Cyanosis: No Excoriation: No Ecchymosis: No Induration: No Erythema: Yes Rash: No Erythema Location: Circumferential Scarring: No Hemosiderin Staining: No Mottled: No Moisture Pallor: No No Abnormalities Noted: No Rubor: No Dry / Scaly: No Maceration: Yes Temperature / Pain Temperature: No Abnormality Tenderness on Palpation: Yes Wound Preparation Ulcer Cleansing: Rinsed/Irrigated with Saline Topical Anesthetic Applied: Other: lidocaine 4%, Treatment Notes Wound #15 (Left, Distal, Anterior Lower Leg) Notes heel - santyl, lower leg - santyl, ABD and conform secured with netting Electronic Signature(s) Signed: 05/02/2018 4:21:01 PM By: Harold Barban Entered By: Harold Barban on 04/30/2018 12:53:52 Cefalu, [name] ([id number]) -------------------------------------------------------------------------------- Wound Assessment Details Patient Name: [name]. Date of Service: 04/30/2018 12:45 PM Medical Record Number: [id number] Patient  Account Number: [id number] Date of Birth/Sex: 04-08-1933 (85 y.o. F) Treating RN: Harold Barban Primary Care Audrionna Lampton: Emily Filbert Other Clinician: Referring Manolito Jurewicz: Emily Filbert Treating Aliany Fiorenza/Extender: Melburn Hake, HOYT Weeks in Treatment: 6 Wound Status Wound Number: 16 Primary Pressure Ulcer Etiology: Wound Location: Left Calcaneus Wound Open Wounding Event: Pressure Injury Status: Date Acquired: 03/11/2018 Comorbid Cataracts, Anemia, Arrhythmia, Hypertension, Weeks Of Treatment: 6 History: Osteoarthritis, Received Radiation Clustered Wound: No Photos Photo Uploaded By: Harold Barban on 04/30/2018 16:31:10 Wound Measurements Length: (cm) 2.5 Width: (cm) 2.2 Depth: (cm) 0.1 Area: (cm) 4.32  Volume: (cm) 0.432 % Reduction in Area: -817.2% % Reduction in Volume: -819.1% Epithelialization: Small (1-33%) Tunneling: No Undermining: No Wound Description Classification: Category/Stage III Wound Margin: Indistinct, nonvisible Exudate Amount: Medium Exudate Type: Purulent Exudate Color: yellow, brown, green Foul Odor After Cleansing: No Slough/Fibrino Yes Wound Bed Granulation Amount: Small (1-33%) Exposed Structure Granulation Quality: Pale Fascia Exposed: No Necrotic Amount: Large (67-100%) Fat Layer (Subcutaneous Tissue) Exposed: Yes Necrotic Quality: Eschar, Adherent Slough Tendon Exposed: No Muscle Exposed: No Joint Exposed: No Bone Exposed: No Periwound Skin Texture Luchsinger, Katerin A. ([id number]) Texture Color No Abnormalities Noted: No No Abnormalities Noted: No Callus: No Atrophie Blanche: No Crepitus: No Cyanosis: No Excoriation: No Ecchymosis: No Induration: No Erythema: No Rash: No Hemosiderin Staining: No Scarring: No Mottled: No Pallor: No Moisture Rubor: No No Abnormalities Noted: No Dry / Scaly: No Temperature / Pain Maceration: No Temperature: No Abnormality Tenderness on Palpation: Yes Wound Preparation Ulcer Cleansing:  Rinsed/Irrigated with Saline Topical Anesthetic Applied: Other: lidocaine 4%, Treatment Notes Wound #16 (Left Calcaneus) Notes heel - santyl, lower leg - santyl, ABD and conform secured with netting Electronic Signature(s) Signed: 05/02/2018 4:21:01 PM By: Harold Barban Entered By: Harold Barban on 04/30/2018 12:51:40 Pelzel, [name] ([id number]) -------------------------------------------------------------------------------- Vitals Details Patient Name: [name]. Date of Service: 04/30/2018 12:45 PM Medical Record Number: [id number] Patient Account Number: [id number] Date of Birth/Sex: [birth year] (85 y.o. F) Treating RN: Harold Barban Primary Care Quinesha Selinger: Emily Filbert Other Clinician: Referring Desmen Schoffstall: Emily Filbert Treating Nachelle Negrette/Extender: Melburn Hake, HOYT Weeks in Treatment: 6 Vital Signs Time Taken: 12:47 Temperature (F): 98.2 Height (in): 68 Pulse (bpm): 68 Weight (lbs): 121 Respiratory Rate (breaths/min): 18 Body Mass Index (BMI): 18.4 Blood Pressure (mmHg): 126/42 Reference Range: 80 - 120 mg / dl Electronic Signature(s) Signed: 05/02/2018 4:21:01 PM By: Harold Barban Entered By: Harold Barban on 04/30/2018 12:52:02

## 2018-05-03 NOTE — Progress Notes (Signed)
[name] ([id number]) Visit Report for 04/30/2018 Chief Complaint Document Details Patient Name: [name], [name]. Date of Service: 04/30/2018 12:45 PM Medical Record Number: [id number] Patient Account Number: [id number] Date of Birth/Sex: [birth year] (85 y.o. F) Treating RN: Harold Barban Primary Care Provider: Emily Filbert Other Clinician: Referring Provider: Emily Filbert Treating Provider/Extender: Melburn Hake, Rafael Salway Weeks in Treatment: 6 Information Obtained from: Patient Chief Complaint Left heel DTI and left LE ulcer Electronic Signature(s) Signed: 04/30/2018 5:17:02 PM By: Worthy Keeler PA-C Entered By: Worthy Keeler on 04/30/2018 12:55:11 [name], [name] ([id number]) -------------------------------------------------------------------------------- Debridement Details Patient Name: [name]. Date of Service: 04/30/2018 12:45 PM Medical Record Number: [id number] Patient Account Number: [id number] Date of Birth/Sex: December 17, 1932 (85 y.o. F) Treating RN: Montey Hora Primary Care Provider: Emily Filbert Other Clinician: Referring Provider: Emily Filbert Treating Provider/Extender: Melburn Hake, Annie Roseboom Weeks in Treatment: 6 Debridement Performed for Wound #15 Left,Distal,Anterior Lower Leg Assessment: Performed By: Clinician Montey Hora, RN Debridement Type: Chemical/Enzymatic/Mechanical Agent Used: Santyl Level of Consciousness (Pre- Awake and Alert procedure): Pre-procedure Verification/Time Yes - 13:09 Out Taken: Start Time: 13:09 Pain Control: Lidocaine 4% Topical Solution Instrument: Other : tongue depressor Bleeding: None End Time: 13:10 Procedural Pain: 0 Post Procedural Pain: 0 Response to Treatment: Procedure was tolerated well Level of Consciousness Awake and Alert (Post-procedure): Post Debridement Measurements of Total Wound Length: (cm) 3.7 Width: (cm) 3 Depth: (cm) 0.2 Volume: (cm) 1.744 Character of Wound/Ulcer Post Debridement: Requires Further  Debridement Post Procedure Diagnosis Same as Pre-procedure Electronic Signature(s) Signed: 04/30/2018 2:58:09 PM By: Montey Hora Signed: 04/30/2018 5:17:02 PM By: Worthy Keeler PA-C Entered By: Montey Hora on 04/30/2018 14:58:08 [name], [name] ([id number]) -------------------------------------------------------------------------------- Debridement Details Patient Name: [name]. Date of Service: 04/30/2018 12:45 PM Medical Record Number: [id number] Patient Account Number: [id number] Date of Birth/Sex: Apr 07, 1933 (85 y.o. F) Treating RN: Montey Hora Primary Care Provider: Emily Filbert Other Clinician: Referring Provider: Emily Filbert Treating Provider/Extender: Melburn Hake, Korrey Schleicher Weeks in Treatment: 6 Debridement Performed for Wound #16 Left Calcaneus Assessment: Performed By: Clinician Montey Hora, RN Debridement Type: Chemical/Enzymatic/Mechanical Agent Used: Santyl Level of Consciousness (Pre- Awake and Alert procedure): Pre-procedure Verification/Time Yes - 13:10 Out Taken: Start Time: 13:10 Pain Control: Lidocaine 4% Topical Solution Instrument: Other : tongue depressor Bleeding: None End Time: 13:11 Procedural Pain: 0 Post Procedural Pain: 0 Response to Treatment: Procedure was tolerated well Level of Consciousness Awake and Alert (Post-procedure): Post Debridement Measurements of Total Wound Length: (cm) 2.5 Stage: Category/Stage III Width: (cm) 2.2 Depth: (cm) 0.1 Volume: (cm) 0.432 Character of Wound/Ulcer Post Requires Further Debridement Debridement: Post Procedure Diagnosis Same as Pre-procedure Electronic Signature(s) Signed: 04/30/2018 2:58:41 PM By: Montey Hora Signed: 04/30/2018 5:17:02 PM By: Worthy Keeler PA-C Entered By: Montey Hora on 04/30/2018 14:58:40 [name], [name] ([id number]) -------------------------------------------------------------------------------- HPI Details Patient Name: [name]. Date of Service: 04/30/2018  12:45 PM Medical Record Number: [id number] Patient Account Number: [id number] Date of Birth/Sex: February 04, 1933 (85 y.o. F) Treating RN: Harold Barban Primary Care Provider: Emily Filbert Other Clinician: Referring Provider: Emily Filbert Treating Provider/Extender: Melburn Hake, Alaine Loughney Weeks in Treatment: 6 History of Present Illness HPI Description: 85 year old patient who is known to the wound center for several years now has a new injury to the right lower extremity where she bumped herself against the bath tub. He also has had a chronic right lateral malleolus open wound which has been coming on and off for several years now. This has been there for at least  2014 and was healed out in November 2016. In the past she has been seen by her cardiologist Dr. Kathlyn Sacramento in April 2016. Noninvasive vascular evaluation showed an ABI of 0.68 on the right and 0.85 on the left. 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 One-vessel runoff below the knee via the peroneal artery with short occlusion of the anterior tibial artery and reconstitution. No intervention performed. Most recently she was seen for a wound on her right lower extremity which was healed out by March 15 She is not a smoker and has had no diabetes mellitus. 07/29/2015 -- the appointment with her cardiologist Dr. Fletcher Anon is still pending for review of her arterial duplex studies. Readmission: 03/19/18 on evaluation today patient presents for readmission concerning an issue she has been having with her left heel and left anterior lower extremity. She has a history of atrial fibrillation and had knee surgery for a total knee replacement three weeks ago today. Subsequently since being discharged from the hospital it was noted that she did have a left heel deep tissue injury as well the left anterior lower extremity ulcer. This has been present apparently for roughly 2 weeks.  She does not appear to have any evidence of infection which is good news. She has been tolerating the dressing changes that have been performed it sounds as if Santyl is what has been used at least most recently. Obviously I think this is likely a good treatment choice for her. Especially in light of the fact that the wound on the distal lower extremity of the left leg appears to show signs of significant slough/eschar buildup which is starting to loosen up now that she is using the central but prior was fairly significant as far as how hard and black it was. She does have pain at both sites the open wound more than the deep tissue injury. Otherwise the patient does have a history of peripheral vascular disease, congestive heart failure, chronic kidney disease stage IV, and now the presence of a left artificial knee joint. 03/28/18 on evaluation today patient appears to be doing a little worse in regard to the dorsal surface of her lower extremity. She has been tolerating the dressing changes without complication although she states there's a lot of pain at this location. Subsequently in regard to her left heel the show signs of continued to drain with maceration noted. I think that some of the dead tissue needs to be removed from the surface of the wound at the site to allow it to heal appropriately. 04/02/18 on evaluation today patient's wounds actually appear to be doing better at this point in regard to the dorsal wound on the anterior lower leg as well as the heel ulcer. She does have an area of thicker eschar of the heel which I think actually needs to be loosened up I think cental or Iodoflex could be beneficial in this regard. Otherwise debridement will likely be undertaken today over the lower leg region in order to clear away some of the necrotic material. 04/09/18 on evaluation today a lot of the skin which is around the patient's heel ulcer actually is lifting off which is good  news. There is some Slough noted on the surface of one centrally this is going to require sharp debridement. The dorsal surface of her foot wound actually appears to be doing much better which is great news. Overall I'm fairly pleased with how things seem  to be progressing. The patient likewise is worried about her heel but otherwise feels like things are going fairly well. 04/23/18 on evaluation today patient appears to be doing fairly well in regard to her ulcer. She has been tolerating the dressing changes with the Santyl and overall I do believe this has been of benefit. With that being said she is still having a lot of issues with pain on the dorsal aspect of her foot although I do believe she's slowly making progress which is good news. No fevers, chills, nausea, or vomiting noted at this time. [name], [name] ([id number]) 04/30/18 on evaluation today patient unfortunately appears to be doing a little bit worse I'm concerned about cellulitis around her wounds. Fortunately there does not appear to be any evidence of infection at this time which is good news. That is systemically. Unfortunately she does have increased pain. Electronic Signature(s) Signed: 04/30/2018 5:17:02 PM By: Worthy Keeler PA-C Entered By: Worthy Keeler on 04/30/2018 13:12:51 [name], [name] ([id number]) -------------------------------------------------------------------------------- Physical Exam Details Patient Name: [name]. Date of Service: 04/30/2018 12:45 PM Medical Record Number: [id number] Patient Account Number: [id number] Date of Birth/Sex: [birth year] (85 y.o. F) Treating RN: Harold Barban Primary Care Provider: Emily Filbert Other Clinician: Referring Provider: Emily Filbert Treating Provider/Extender: Melburn Hake, Azaliah Carrero Weeks in Treatment: 6 Constitutional Well-nourished and well-hydrated in no acute distress. Respiratory normal breathing without difficulty. clear to auscultation  bilaterally. Cardiovascular regular rate and rhythm with normal S1, S2. Psychiatric this patient is able to make decisions and demonstrates good insight into disease process. Alert and Oriented x 3. pleasant and cooperative. Notes Patient's wounds do appear to be a little bit smaller when compared to last week's evaluation. With that being said there's more erythema surrounding I'm concerned about infection at this point unfortunately. There does not appear to be any signs of systemic infection. Electronic Signature(s) Signed: 04/30/2018 5:17:02 PM By: Worthy Keeler PA-C Entered By: Worthy Keeler on 04/30/2018 13:14:18 Benavides, [name] ([id number]) -------------------------------------------------------------------------------- Physician Orders Details Patient Name: [name] Date of Service: 04/30/2018 12:45 PM Medical Record Number: [id number] Patient Account Number: [id number] Date of Birth/Sex: 02/04/1933 (85 y.o. F) Treating RN: Montey Hora Primary Care Provider: Emily Filbert Other Clinician: Referring Provider: Emily Filbert Treating Provider/Extender: Melburn Hake, Anthonette Lesage Weeks in Treatment: 6 Verbal / Phone Orders: No Diagnosis Coding ICD-10 Coding Code Description S81.802A Unspecified open wound, left lower leg, initial encounter L97.822 Non-pressure chronic ulcer of other part of left lower leg with fat layer exposed L89.626 Pressure-induced deep tissue damage of left heel I73.9 Peripheral vascular disease, unspecified I50.42 Chronic combined systolic (congestive) and diastolic (congestive) heart failure N18.4 Chronic kidney disease, stage 4 (severe) D78.242 Presence of left artificial knee joint Wound Cleansing Wound #15 Left,Distal,Anterior Lower Leg o Cleanse wound with mild soap and water Wound #16 Left Calcaneus o Cleanse wound with mild soap and water Primary Wound Dressing Wound #15 Left,Distal,Anterior Lower Leg o Santyl Ointment Wound #16 Left  Calcaneus o Santyl Ointment Secondary Dressing Wound #15 Left,Distal,Anterior Lower Leg o Gauze, ABD and Kerlix/Conform - secure with netting Wound #16 Left Calcaneus o Gauze, ABD and Kerlix/Conform - secure with netting Dressing Change Frequency Wound #15 Left,Distal,Anterior Lower Leg o Change dressing every day. Wound #16 Left Calcaneus o Change dressing every day. Follow-up Appointments Wound #15 Left,Distal,Anterior Lower Leg o Return Appointment in 1 week. [name], [name] ([id number]) Wound #16 Left Calcaneus o Return Appointment in 1 week. Off-Loading Wound #16 Left Calcaneus   o Other: - Please continue wearing your boot to protect your heel Patient Medications Allergies: Lipitor, Penicillins, doxycycline, Vioxx, Cipro, amlodipine, Macrobid Notifications Medication Indication Start End Keflex 04/30/2018 DOSE 1 - oral 500 mg capsule - 1 capsule oral taken 3 times a day for 10 days Electronic Signature(s) Signed: 04/30/2018 1:16:50 PM By: Worthy Keeler PA-C Entered By: Worthy Keeler on 04/30/2018 13:16:49 Casella, [name] ([id number]) -------------------------------------------------------------------------------- Problem List Details Patient Name: [name]. Date of Service: 04/30/2018 12:45 PM Medical Record Number: [id number] Patient Account Number: [id number] Date of Birth/Sex: Jun 22, 1932 (85 y.o. F) Treating RN: Harold Barban Primary Care Provider: Emily Filbert Other Clinician: Referring Provider: Emily Filbert Treating Provider/Extender: Melburn Hake, Deshawna Mcneece Weeks in Treatment: 6 Active Problems ICD-10 Evaluated Encounter Code Description Active Date Today Diagnosis S81.802A Unspecified open wound, left lower leg, initial encounter 03/19/2018 No Yes L97.822 Non-pressure chronic ulcer of other part of left lower leg with 03/19/2018 No Yes fat layer exposed L89.626 Pressure-induced deep tissue damage of left heel 03/19/2018 No Yes I73.9 Peripheral vascular  disease, unspecified 03/19/2018 No Yes I50.42 Chronic combined systolic (congestive) and diastolic 55/10/3218 No Yes (congestive) heart failure N18.4 Chronic kidney disease, stage 4 (severe) 03/19/2018 No Yes Z96.652 Presence of left artificial knee joint 03/19/2018 No Yes Inactive Problems Resolved Problems Electronic Signature(s) Signed: 04/30/2018 5:17:02 PM By: Worthy Keeler PA-C Entered By: Worthy Keeler on 04/30/2018 12:55:05 [name], [name] ([id number]) -------------------------------------------------------------------------------- Progress Note/History and Physical Details Patient Name: [name] Date of Service: 04/30/2018 12:45 PM Medical Record Number: [id number] Patient Account Number: [id number] Date of Birth/Sex: May 21, 1932 (85 y.o. F) Treating RN: Harold Barban Primary Care Provider: Emily Filbert Other Clinician: Referring Provider: Emily Filbert Treating Provider/Extender: Melburn Hake, Keasha Malkiewicz Weeks in Treatment: 6 Subjective Chief Complaint Information obtained from Patient Left heel DTI and left LE ulcer History of Present Illness (HPI) 85 year old patient who is known to the wound center for several years now has a new injury to the right lower extremity where she bumped herself against the bath tub. He also has had a chronic right lateral malleolus open wound which has been coming on and off for several years now. This has been there for at least 2014 and was healed out in November 2016. In the past she has been seen by her cardiologist Dr. Kathlyn Sacramento in April 2016. Noninvasive vascular evaluation showed an ABI of 0.68 on the right and 0.85 on the left. 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 One-vessel runoff below the knee via the peroneal artery with short occlusion of the anterior tibial artery and reconstitution. No intervention performed. Most recently she was seen for a wound on her  right lower extremity which was healed out by March 15 She is not a smoker and has had no diabetes mellitus. 07/29/2015 -- the appointment with her cardiologist Dr. Fletcher Anon is still pending for review of her arterial duplex studies. Readmission: 03/19/18 on evaluation today patient presents for readmission concerning an issue she has been having with her left heel and left anterior lower extremity. She has a history of atrial fibrillation and had knee surgery for a total knee replacement three weeks ago today. Subsequently since being discharged from the hospital it was noted that she did have a left heel deep tissue injury as well the left anterior lower extremity ulcer. This has been present apparently for roughly 2 weeks. She does not appear to have any evidence of  infection which is good news. She has been tolerating the dressing changes that have been performed it sounds as if Santyl is what has been used at least most recently. Obviously I think this is likely a good treatment choice for her. Especially in light of the fact that the wound on the distal lower extremity of the left leg appears to show signs of significant slough/eschar buildup which is starting to loosen up now that she is using the central but prior was fairly significant as far as how hard and black it was. She does have pain at both sites the open wound more than the deep tissue injury. Otherwise the patient does have a history of peripheral vascular disease, congestive heart failure, chronic kidney disease stage IV, and now the presence of a left artificial knee joint. 03/28/18 on evaluation today patient appears to be doing a little worse in regard to the dorsal surface of her lower extremity. She has been tolerating the dressing changes without complication although she states there's a lot of pain at this location. Subsequently in regard to her left heel the show signs of continued to drain with maceration noted. I think  that some of the dead tissue needs to be removed from the surface of the wound at the site to allow it to heal appropriately. 04/02/18 on evaluation today patient's wounds actually appear to be doing better at this point in regard to the dorsal wound on the anterior lower leg as well as the heel ulcer. She does have an area of thicker eschar of the heel which I think actually needs to be loosened up I think cental or Iodoflex could be beneficial in this regard. Otherwise debridement will likely be undertaken today over the lower leg region in order to clear away some of the necrotic material. 04/09/18 on evaluation today a lot of the skin which is around the patient's heel ulcer actually is lifting off which is good news. There is some Slough noted on the surface of one centrally this is going to require sharp debridement. The dorsal surface of her foot wound actually appears to be doing much better which is great news. Overall I'm fairly pleased with how things seem to be progressing. The patient likewise is worried about her heel but otherwise feels like things are going fairly well. [name], [name] ([id number]) 04/23/18 on evaluation today patient appears to be doing fairly well in regard to her ulcer. She has been tolerating the dressing changes with the Santyl and overall I do believe this has been of benefit. With that being said she is still having a lot of issues with pain on the dorsal aspect of her foot although I do believe she's slowly making progress which is good news. No fevers, chills, nausea, or vomiting noted at this time. 04/30/18 on evaluation today patient unfortunately appears to be doing a little bit worse I'm concerned about cellulitis around her wounds. Fortunately there does not appear to be any evidence of infection at this time which is good news. That is systemically. Unfortunately she does have increased pain. Wound History Patient presents with 1 open wound that has been  present for approximately 2 weeks. Laboratory tests have been performed in the last month. Patient reportedly has tested positive for an antibiotic resistant organism. Patient reportedly has not tested positive for osteomyelitis. Patient reportedly has not had testing performed to evaluate circulation in the legs. Patient History Information obtained from Patient. Family History Cancer - Siblings, Heart  Disease - Father, Hypertension - Siblings, Stroke - Mother, No family history of Hereditary Spherocytosis, Kidney Disease, Lung Disease, Seizures, Thyroid Problems, Tuberculosis. Social History Never smoker, Marital Status - Married, Alcohol Use - Never, Drug Use - No History, Caffeine Use - Daily. Medical History Eyes Patient has history of Cataracts Denies history of Glaucoma, Optic Neuritis Ear/Nose/Mouth/Throat Denies history of Chronic sinus problems/congestion, Middle ear problems Hematologic/Lymphatic Patient has history of Anemia Denies history of Hemophilia, Human Immunodeficiency Virus, Lymphedema, Sickle Cell Disease Respiratory Denies history of Aspiration, Asthma, Chronic Obstructive Pulmonary Disease (COPD), Pneumothorax, Sleep Apnea, Tuberculosis Cardiovascular Patient has history of Arrhythmia - A fib, Hypertension Denies history of Angina, Congestive Heart Failure, Coronary Artery Disease, Hypotension, Myocardial Infarction, Peripheral Arterial Disease, Peripheral Venous Disease, Phlebitis, Vasculitis Gastrointestinal Denies history of Cirrhosis , Colitis, Crohn s, Hepatitis A, Hepatitis B, Hepatitis C Endocrine Denies history of Type I Diabetes, Type II Diabetes Genitourinary Denies history of End Stage Renal Disease Immunological Denies history of Lupus Erythematosus, Raynaud s, Scleroderma Integumentary (Skin) Denies history of History of Burn, History of pressure wounds Musculoskeletal Patient has history of Osteoarthritis Denies history of Gout, Rheumatoid  Arthritis Neurologic Denies history of Dementia, Neuropathy, Quadriplegia, Paraplegia, Seizure Disorder Oncologic Patient has history of Received Radiation Denies history of Received Chemotherapy Psychiatric [name], [name] ([id number]) Denies history of Anorexia/bulimia, Confinement Anxiety Hospitalization/Surgery History - 09/16/2014, [facility], (R) knee replacement. Medical And Surgical History Notes Ear/Nose/Mouth/Throat HOH- hearing aids Genitourinary frequent UTIs Oncologic Breast Ca- R mastectomy Review of Systems (ROS) Constitutional Symptoms (General Health) Denies complaints or symptoms of Fever, Chills. Respiratory The patient has no complaints or symptoms. Cardiovascular The patient has no complaints or symptoms. Psychiatric The patient has no complaints or symptoms. Objective Constitutional Well-nourished and well-hydrated in no acute distress. Vitals Time Taken: 12:47 PM, Height: 68 in, Weight: 121 lbs, BMI: 18.4, Temperature: 98.2 F, Pulse: 68 bpm, Respiratory Rate: 18 breaths/min, Blood Pressure: 126/42 mmHg. Respiratory normal breathing without difficulty. clear to auscultation bilaterally. Cardiovascular regular rate and rhythm with normal S1, S2. Psychiatric this patient is able to make decisions and demonstrates good insight into disease process. Alert and Oriented x 3. pleasant and cooperative. General Notes: Patient's wounds do appear to be a little bit smaller when compared to last week's evaluation. With that being said there's more erythema surrounding I'm concerned about infection at this point unfortunately. There does not appear to be any signs of systemic infection. Integumentary (Hair, Skin) Wound #15 status is Open. Original cause of wound was Shear/Friction. The wound is located on the [facility] Lower Leg. The wound measures 3.7cm length x 3cm width x 0.2cm depth; 8.718cm^2 area and 1.744cm^3 volume. There is Fat Layer (Subcutaneous Tissue)  Exposed exposed. There is no tunneling or undermining noted. There is a large amount of purulent drainage noted. The wound margin is flat and intact. There is no granulation within the wound bed. There is a large Campillo, Christinea A. ([id number]) (67-100%) amount of necrotic tissue within the wound bed including Adherent Slough. The periwound skin appearance exhibited: Maceration, Erythema. The periwound skin appearance did not exhibit: Callus, Crepitus, Excoriation, Induration, Rash, Scarring, Dry/Scaly, Atrophie Blanche, Cyanosis, Ecchymosis, Hemosiderin Staining, Mottled, Pallor, Rubor. The surrounding wound skin color is noted with erythema which is circumferential. Periwound temperature was noted as No Abnormality. The periwound has tenderness on palpation. Wound #16 status is Open. Original cause of wound was Pressure Injury. The wound is located on the Left Calcaneus. The wound measures 2.5cm length x 2.2cm width x  0.1cm depth; 4.32cm^2 area and 0.432cm^3 volume. There is Fat Layer (Subcutaneous Tissue) Exposed exposed. There is no tunneling or undermining noted. There is a medium amount of purulent drainage noted. The wound margin is indistinct and nonvisible. There is small (1-33%) pale granulation within the wound bed. There is a large (67-100%) amount of necrotic tissue within the wound bed including Eschar and Adherent Slough. The periwound skin appearance did not exhibit: Callus, Crepitus, Excoriation, Induration, Rash, Scarring, Dry/Scaly, Maceration, Atrophie Blanche, Cyanosis, Ecchymosis, Hemosiderin Staining, Mottled, Pallor, Rubor, Erythema. Periwound temperature was noted as No Abnormality. The periwound has tenderness on palpation. Assessment Active Problems ICD-10 Unspecified open wound, left lower leg, initial encounter Non-pressure chronic ulcer of other part of left lower leg with fat layer exposed Pressure-induced deep tissue damage of left heel Peripheral vascular disease,  unspecified Chronic combined systolic (congestive) and diastolic (congestive) heart failure Chronic kidney disease, stage 4 (severe) Presence of left artificial knee joint Plan Wound Cleansing: Wound #15 Left,Distal,Anterior Lower Leg: Cleanse wound with mild soap and water Wound #16 Left Calcaneus: Cleanse wound with mild soap and water Primary Wound Dressing: Wound #15 Left,Distal,Anterior Lower Leg: Santyl Ointment Wound #16 Left Calcaneus: Santyl Ointment Secondary Dressing: Wound #15 Left,Distal,Anterior Lower Leg: Gauze, ABD and Kerlix/Conform - secure with netting Wound #16 Left Calcaneus: Gauze, ABD and Kerlix/Conform - secure with netting Dressing Change Frequency: Wound #15 Left,Distal,Anterior Lower Leg: Change dressing every day. Wound #16 Left Calcaneus: Change dressing every day. [name], [name] ([id number]) Follow-up Appointments: Wound #15 Left,Distal,Anterior Lower Leg: Return Appointment in 1 week. Wound #16 Left Calcaneus: Return Appointment in 1 week. Off-Loading: Wound #16 Left Calcaneus: Other: - Please continue wearing your boot to protect your heel The following medication(s) was prescribed: Keflex oral 500 mg capsule 1 1 capsule oral taken 3 times a day for 10 days starting 04/30/2018 At this point I have recommended that we continue with the above wound care measures for the next week. The patient is in agreement the plan. I am gonna go ahead and send in a prescription for Keflex for her she's able to take this without complication. We will subsequently see were things stand at follow-up. She's in agreement with this plan. If anything changes or worsens in the interim until I see her next week she will contact the office and let me know. Please see above for specific wound care orders. We will see patient for re-evaluation in 1 week(s) here in the clinic. If anything worsens or changes patient will contact our office for additional  recommendations. Electronic Signature(s) Signed: 04/30/2018 5:17:02 PM By: Worthy Keeler PA-C Entered By: Worthy Keeler on 04/30/2018 13:17:07 [name], [name] ([id number]) -------------------------------------------------------------------------------- ROS/PFSH Details Patient Name: [name] Date of Service: 04/30/2018 12:45 PM Medical Record Number: [id number] Patient Account Number: [id number] Date of Birth/Sex: Jun 28, 1932 (85 y.o. F) Treating RN: Harold Barban Primary Care Provider: Emily Filbert Other Clinician: Referring Provider: Emily Filbert Treating Provider/Extender: Melburn Hake, Lewellyn Fultz Weeks in Treatment: 6 Label Progress Note Print Version as History and Physical for this encounter Information Obtained From Patient Wound History Do you currently have one or more open woundso Yes How many open wounds do you currently haveo 1 Approximately how long have you had your woundso 2 weeks Have you had any lab work done in the past montho Yes Have you tested positive for osteomyelitis (bone infection)o No Have you had any tests for circulation on your legso No Constitutional Symptoms (General Health) Complaints and Symptoms: Negative  for: Fever; Chills Eyes Medical History: Positive for: Cataracts Negative for: Glaucoma; Optic Neuritis Ear/Nose/Mouth/Throat Medical History: Negative for: Chronic sinus problems/congestion; Middle ear problems Past Medical History Notes: HOH- hearing aids Hematologic/Lymphatic Medical History: Positive for: Anemia Negative for: Hemophilia; Human Immunodeficiency Virus; Lymphedema; Sickle Cell Disease Respiratory Complaints and Symptoms: No Complaints or Symptoms Medical History: Negative for: Aspiration; Asthma; Chronic Obstructive Pulmonary Disease (COPD); Pneumothorax; Sleep Apnea; Tuberculosis Cardiovascular Complaints and Symptoms: No Complaints or Symptoms [name], [name]. ([id number]) Medical History: Positive for: Arrhythmia - A fib;  Hypertension Negative for: Angina; Congestive Heart Failure; Coronary Artery Disease; Hypotension; Myocardial Infarction; Peripheral Arterial Disease; Peripheral Venous Disease; Phlebitis; Vasculitis Gastrointestinal Medical History: Negative for: Cirrhosis ; Colitis; Crohnos; Hepatitis A; Hepatitis B; Hepatitis C Endocrine Medical History: Negative for: Type I Diabetes; Type II Diabetes Genitourinary Medical History: Negative for: End Stage Renal Disease Past Medical History Notes: frequent UTIs Immunological Medical History: Negative for: Lupus Erythematosus; Raynaudos; Scleroderma Integumentary (Skin) Medical History: Negative for: History of Burn; History of pressure wounds Musculoskeletal Medical History: Positive for: Osteoarthritis Negative for: Gout; Rheumatoid Arthritis Neurologic Medical History: Negative for: Dementia; Neuropathy; Quadriplegia; Paraplegia; Seizure Disorder Oncologic Medical History: Positive for: Received Radiation Negative for: Received Chemotherapy Past Medical History Notes: Breast Ca- R mastectomy Psychiatric Complaints and Symptoms: No Complaints or Symptoms Medical History: Negative for: Anorexia/bulimia; Confinement Anxiety Antwi, Arlie A. ([id number]) HBO Extended History Items Eyes: Cataracts Immunizations Pneumococcal Vaccine: Received Pneumococcal Vaccination: No Tetanus Vaccine: Last tetanus shot: 08/15/2008 Implantable Devices Hospitalization / Surgery History Name of Hospital Purpose of Hospitalization/Surgery Date [facility] (R) knee replacement 09/16/2014 Family and Social History Cancer: Yes - Siblings; Heart Disease: Yes - Father; Hereditary Spherocytosis: No; Hypertension: Yes - Siblings; Kidney Disease: No; Lung Disease: No; Seizures: No; Stroke: Yes - Mother; Thyroid Problems: No; Tuberculosis: No; Never smoker; Marital Status - Married; Alcohol Use: Never; Drug Use: No History; Caffeine Use: Daily; Financial Concerns: No;  Food, Clothing or Shelter Needs: No; Support System Lacking: No; Transportation Concerns: No; Advanced Directives: Yes (Not Provided); Patient does not want information on Advanced Directives; Do not resuscitate: No; Living Will: Yes (Not Provided); Medical Power of Attorney: Yes (Not Provided) Physician Affirmation I have reviewed and agree with the above information. Electronic Signature(s) Signed: 04/30/2018 5:17:02 PM By: Worthy Keeler PA-C Signed: 05/02/2018 4:21:01 PM By: Harold Barban Entered By: Worthy Keeler on 04/30/2018 13:13:20 Sandt, [name] ([id number]) -------------------------------------------------------------------------------- SuperBill Details Patient Name: [name]. Date of Service: 04/30/2018 Medical Record Number: [id number] Patient Account Number: [id number] Date of Birth/Sex: Sep 03, 1932 (85 y.o. F) Treating RN: Harold Barban Primary Care Provider: Emily Filbert Other Clinician: Referring Provider: Emily Filbert Treating Provider/Extender: Melburn Hake, Israel Wunder Weeks in Treatment: 6 Diagnosis Coding ICD-10 Codes Code Description [id number] Unspecified open wound, left lower leg, initial encounter L97.822 Non-pressure chronic ulcer of other part of left lower leg with fat layer exposed L89.626 Pressure-induced deep tissue damage of left heel I73.9 Peripheral vascular disease, unspecified I50.42 Chronic combined systolic (congestive) and diastolic (congestive) heart failure N18.4 Chronic kidney disease, stage 4 (severe) D78.242 Presence of left artificial knee joint Facility Procedures CPT4 Code: 35361443 Description: 15400 - DEBRIDE W/O ANES NON SELECT Modifier: Quantity: 1 Physician Procedures CPT4 Code Description: 8676195 99214 - WC PHYS LEVEL 4 - EST PT ICD-10 Diagnosis Description S81.802A Unspecified open wound, left lower leg, initial encounter L97.822 Non-pressure chronic ulcer of other part of left lower leg wit L89.626  Pressure-induced deep tissue  damage of left heel I73.9 Peripheral vascular disease,  unspecified Modifier: h fat layer expos Quantity: 1 ed Electronic Signature(s) Signed: 04/30/2018 2:58:57 PM By: Montey Hora Signed: 04/30/2018 5:17:02 PM By: Worthy Keeler PA-C Entered By: Montey Hora on 04/30/2018 14:58:57

## 2018-05-07 ENCOUNTER — Telehealth: Payer: Self-pay | Admitting: Cardiovascular Disease

## 2018-05-07 ENCOUNTER — Encounter: Payer: Medicare HMO | Admitting: Physician Assistant

## 2018-05-07 DIAGNOSIS — L97822 Non-pressure chronic ulcer of other part of left lower leg with fat layer exposed: Secondary | ICD-10-CM | POA: Diagnosis not present

## 2018-05-07 NOTE — Telephone Encounter (Signed)
New message     hoyt stone PA from  wound care center at [facility] stated that he cant get  pt dystolic pressure

## 2018-05-07 NOTE — Telephone Encounter (Signed)
I spoke with Mr. Joaquim Lai regarding Ms. Deroos.  She presented for weekly wound care and was noted to have low diastolic BP.  She also complained of a recent fall with subsequent headache.  Given hypotension and headache in the setting of recent fall and chronic anticoagulation with apixaban, I have recommended that Ms. Bink go to the ED for further evaluation.  Nelva Bush, MD [facility] Pager: [phone]

## 2018-05-07 NOTE — Telephone Encounter (Signed)
Message left for [facility], [place] to call back. In reviewing the situation with another provider in the office, he has recommended attempting to check the blood pressure with a machine because in certain situations the machine can pick up the diastolic (if this has has not been done already)

## 2018-05-07 NOTE — Telephone Encounter (Signed)
Spoke with Jeri Cos PA and he states that when he was checking patients blood pressure the diastolic reading was low. He states that he was able to hear even when there was no cuff inflated. Patients systolic was fine and he was just concerned about the diastolic finding. By machine it was reading in the 30's. He wanted to discuss this unusual finding with Dr. Fletcher Anon and reviewed that he is presently out of the office. He states that the patient sees them weekly and has saw them for over a month now. They have not detected this prior to today. He did leave his number for Dr. Fletcher Anon to call. Advised that he is out of the office and will return call when he returns. He was agreeable with no further questions at this time.

## 2018-05-07 NOTE — Telephone Encounter (Signed)
PA from [facility] is calling, states pt did fall on Saturday. States pt BP is 331, and diastolic he is unable to get a reading. States pt has a headache, but no other symptoms. Pt is in the office

## 2018-05-07 NOTE — Telephone Encounter (Signed)
Returned the call to [employer], PA from the wound center. He was calling for advice on the patient. He is unable to get a diastolic pressure on the patient. He has been advised that at the moment a provider was unavailable for consultation.

## 2018-05-08 NOTE — Progress Notes (Signed)
CHEQUITA, MOFIELD ([id number]) Visit Report for 05/07/2018 Fall Risk Assessment Details Patient Name: [name], [name]. Date of Service: 05/07/2018 10:45 AM Medical Record Number: [id number] Patient Account Number: [id number] Date of Birth/Sex: 1932-05-15 (85 y.o. F) Treating RN: Montey Hora Primary Care Shatha Hooser: Emily Filbert Other Clinician: Referring Angeleen Horney: Emily Filbert Treating Marliss Buttacavoli/Extender: Melburn Hake, HOYT Weeks in Treatment: 7 Fall Risk Assessment Items Have you had 2 or more falls in the last 12 monthso 0 No Have you had any fall that resulted in injury in the last 12 monthso 0 No FALL RISK ASSESSMENT: History of falling - immediate or within 3 months 25 Yes Secondary diagnosis 0 No Ambulatory aid None/bed rest/wheelchair/nurse 0 No Crutches/cane/walker 15 Yes Furniture 0 No IV Access/Saline Lock 0 No Gait/Training Normal/bed rest/immobile 0 No Weak 10 Yes Impaired 0 No Mental Status Oriented to own ability 0 Yes Electronic Signature(s) Signed: 05/07/2018 5:03:48 PM By: Montey Hora Entered By: Montey Hora on 05/07/2018 11:35:12

## 2018-05-08 NOTE — Telephone Encounter (Signed)
Spoke with patient and she states that she didn't go to the ED because she didn't feel like she needed to go. She still reports a headache and that tylenol has helped. She does not want to go to ED and reviewed how being on Eliquis and having a fall can increase her risk of bleeding and complications. She verbalized understanding but states that she just would prefer to see her primary care provider and let him decide on what she should do. Reviewed that the cardiologist wanted her to go for further evaluation and she again declined at this time. Instructed her to please call back if she should have any further questions and she verbalized understanding. Reviewed signs and symptoms to monitor for that would require immediate evaluation and she verbalized understanding.

## 2018-05-09 NOTE — Progress Notes (Signed)
[name], [name] ([id number]) Visit Report for 05/07/2018 Arrival Information Details Patient Name: [name], [name]. Date of Service: 05/07/2018 10:45 AM Medical Record Number: [id number] Patient Account Number: [id number] Date of Birth/Sex: 12/05/1932 (85 y.o. F) Treating RN: Montey Hora Primary Care Jolyne Laye: Emily Filbert Other Clinician: Referring Cache Decoursey: Emily Filbert Treating Jung Yurchak/Extender: Melburn Hake, HOYT Weeks in Treatment: 7 Visit Information History Since Last Visit Added or deleted any medications: No Patient Arrived: Cane Any new allergies or adverse reactions: No Arrival Time: 10:49 Had a fall or experienced change in Yes Accompanied By: husband activities of daily living that may affect Transfer Assistance: None risk of falls: Patient Identification Verified: Yes Signs or symptoms of abuse/neglect since last visito No Secondary Verification Process Yes Hospitalized since last visit: No Completed: Implantable device outside of the clinic excluding No Patient Has Alerts: Yes cellular tissue based products placed in the center Patient Alerts: Patient on Blood since last visit: Thinner Has Dressing in Place as Prescribed: Yes Eliquis Pain Present Now: Yes ABI 05/23/17 L.88 R 1.16 Electronic Signature(s) Signed: 05/07/2018 4:33:45 PM By: Lorine Bears RCP, RRT, CHT Entered By: Lorine Bears on 05/07/2018 10:50:31 Larranaga, [name] ([id number]) -------------------------------------------------------------------------------- Clinic Level of Care Assessment Details Patient Name: [name]. Date of Service: 05/07/2018 10:45 AM Medical Record Number: [id number] Patient Account Number: [id number] Date of Birth/Sex: 03/31/1933 (85 y.o. F) Treating RN: Montey Hora Primary Care Gabriana Wilmott: Emily Filbert Other Clinician: Referring Anjali Manzella: Emily Filbert Treating Iram Astorino/Extender: Melburn Hake, HOYT Weeks in Treatment: 7 Clinic Level of Care Assessment  Items TOOL 4 Quantity Score []  - Use when only an EandM is performed on FOLLOW-UP visit 0 ASSESSMENTS - Nursing Assessment / Reassessment X - Reassessment of Co-morbidities (includes updates in patient status) 1 10 X- 1 5 Reassessment of Adherence to Treatment Plan ASSESSMENTS - Wound and Skin Assessment / Reassessment []  - Simple Wound Assessment / Reassessment - one wound 0 X- 2 5 Complex Wound Assessment / Reassessment - multiple wounds []  - 0 Dermatologic / Skin Assessment (not related to wound area) ASSESSMENTS - Focused Assessment []  - Circumferential Edema Measurements - multi extremities 0 []  - 0 Nutritional Assessment / Counseling / Intervention X- 1 5 Lower Extremity Assessment (monofilament, tuning fork, pulses) []  - 0 Peripheral Arterial Disease Assessment (using hand held doppler) ASSESSMENTS - Ostomy and/or Continence Assessment and Care []  - Incontinence Assessment and Management 0 []  - 0 Ostomy Care Assessment and Management (repouching, etc.) PROCESS - Coordination of Care X - Simple Patient / Family Education for ongoing care 1 15 []  - 0 Complex (extensive) Patient / Family Education for ongoing care X- 1 10 Staff obtains Programmer, systems, Records, Test Results / Process Orders []  - 0 Staff telephones HHA, Nursing Homes / Clarify orders / etc []  - 0 Routine Transfer to another Facility (non-emergent condition) []  - 0 Routine Hospital Admission (non-emergent condition) []  - 0 New Admissions / Biomedical engineer / Ordering NPWT, Apligraf, etc. []  - 0 Emergency Hospital Admission (emergent condition) X- 1 10 Simple Discharge Coordination Latoya, Bailey. ([id number]) []  - 0 Complex (extensive) Discharge Coordination PROCESS - Special Needs []  - Pediatric / Minor Patient Management 0 []  - 0 Isolation Patient Management []  - 0 Hearing / Language / Visual special needs []  - 0 Assessment of Community assistance (transportation, D/C planning, etc.) []  -  0 Additional assistance / Altered mentation []  - 0 Support Surface(s) Assessment (bed, cushion, seat, etc.) INTERVENTIONS - Wound Cleansing / Measurement []  - Simple Wound  Cleansing - one wound 0 X- 2 5 Complex Wound Cleansing - multiple wounds X- 1 5 Wound Imaging (photographs - any number of wounds) []  - 0 Wound Tracing (instead of photographs) []  - 0 Simple Wound Measurement - one wound X- 2 5 Complex Wound Measurement - multiple wounds INTERVENTIONS - Wound Dressings X - Small Wound Dressing one or multiple wounds 2 10 []  - 0 Medium Wound Dressing one or multiple wounds []  - 0 Large Wound Dressing one or multiple wounds []  - 0 Application of Medications - topical []  - 0 Application of Medications - injection INTERVENTIONS - Miscellaneous []  - External ear exam 0 []  - 0 Specimen Collection (cultures, biopsies, blood, body fluids, etc.) []  - 0 Specimen(s) / Culture(s) sent or taken to Lab for analysis []  - 0 Patient Transfer (multiple staff / Civil Service fast streamer / Similar devices) []  - 0 Simple Staple / Suture removal (25 or less) []  - 0 Complex Staple / Suture removal (26 or more) []  - 0 Hypo / Hyperglycemic Management (close monitor of Blood Glucose) []  - 0 Ankle / Brachial Index (ABI) - do not check if billed separately X- 1 5 Vital Signs [name], Latoya A. ([id number]) Has the patient been seen at the hospital within the last three years: Yes Total Score: 115 Level Of Care: New/Established - Level 3 Electronic Signature(s) Signed: 05/07/2018 5:03:48 PM By: Montey Hora Entered By: Montey Hora on 05/07/2018 12:43:02 [name], [name] ([id number]) -------------------------------------------------------------------------------- Encounter Discharge Information Details Patient Name: [name]. Date of Service: 05/07/2018 10:45 AM Medical Record Number: [id number] Patient Account Number: [id number] Date of Birth/Sex: 1933-03-03 (85 y.o. F) Treating RN: Montey Hora Primary  Care Ubah Radke: Emily Filbert Other Clinician: Referring Salina Stanfield: Emily Filbert Treating Kien Mirsky/Extender: Melburn Hake, HOYT Weeks in Treatment: 7 Encounter Discharge Information Items Discharge Condition: Stable Ambulatory Status: Cane Discharge Destination: Home Transportation: Private Auto Accompanied By: spouse Schedule Follow-up Appointment: Yes Clinical Summary of Care: Electronic Signature(s) Signed: 05/07/2018 12:44:07 PM By: Montey Hora Entered By: Montey Hora on 05/07/2018 12:44:06 [name], [name] ([id number]) -------------------------------------------------------------------------------- Lower Extremity Assessment Details Patient Name: [name]. Date of Service: 05/07/2018 10:45 AM Medical Record Number: [id number] Patient Account Number: [id number] Date of Birth/Sex: 14-Mar-1933 (85 y.o. F) Treating RN: Secundino Ginger Primary Care Elih Mooney: Emily Filbert Other Clinician: Referring Margaretmary Prisk: Emily Filbert Treating Chancy Smigiel/Extender: Melburn Hake, HOYT Weeks in Treatment: 7 Edema Assessment Assessed: [Left: No] [Right: No] [Left: Edema] [Right: :] Calf Left: Right: Point of Measurement: 31 cm From Medial Instep 30 cm cm Ankle Left: Right: Point of Measurement: 12 cm From Medial Instep 22 cm cm Vascular Assessment Claudication: Claudication Assessment [Left:None] Pulses: Dorsalis Pedis Palpable: [Left:Yes] Posterior Tibial Extremity colors, hair growth, and conditions: Extremity Color: [Left:Hyperpigmented] Hair Growth on Extremity: [Left:No] Temperature of Extremity: [Left:Cool] Capillary Refill: [Left:< 3 seconds] Toe Nail Assessment Left: Right: Thick: No Discolored: No Deformed: No Improper Length and Hygiene: No Electronic Signature(s) Signed: 05/07/2018 3:42:40 PM By: Secundino Ginger Entered By: Secundino Ginger on 05/07/2018 11:02:25 [name], [name] ([id number]) -------------------------------------------------------------------------------- Multi Wound Chart  Details Patient Name: [name]. Date of Service: 05/07/2018 10:45 AM Medical Record Number: [id number] Patient Account Number: [id number] Date of Birth/Sex: 1932-11-30 (85 y.o. F) Treating RN: Montey Hora Primary Care Yezenia Fredrick: Emily Filbert Other Clinician: Referring Dennisse Swader: Emily Filbert Treating Donnica Jarnagin/Extender: Melburn Hake, HOYT Weeks in Treatment: 7 Vital Signs Height(in): 68 Pulse(bpm): 69 Weight(lbs): 121 Blood Pressure(mmHg): 120/32 Body Mass Index(BMI): 18 Temperature(F): 97.9 Respiratory Rate 18 (breaths/min): Photos: [15:No Photos] [16:No  Photos] [N/A:N/A] Wound Location: [15:Left Lower Leg - Anterior, Distal] [16:Left Calcaneus] [N/A:N/A] Wounding Event: [15:Shear/Friction] [16:Pressure Injury] [N/A:N/A] Primary Etiology: [15:Skin Tear] [16:Pressure Ulcer] [N/A:N/A] Comorbid History: [15:Cataracts, Anemia, Arrhythmia, Hypertension, Osteoarthritis, Received Radiation] [16:Cataracts, Anemia, Arrhythmia, Hypertension, Osteoarthritis, Received Radiation] [N/A:N/A] Date Acquired: [15:03/05/2018] [16:03/11/2018] [N/A:N/A] Weeks of Treatment: [15:7] [16:7] [N/A:N/A] Wound Status: [15:Open] [16:Open] [N/A:N/A] Measurements L x W x D [15:3.5x3.8x0.2] [16:2x3.4x0.1] [N/A:N/A] (cm) Area (cm) : [15:10.446] [16:5.341] [N/A:N/A] Volume (cm) : [15:2.089] [16:0.534] [N/A:N/A] % Reduction in Area: [15:29.30%] [16:-1034.00%] [N/A:N/A] % Reduction in Volume: [15:29.30%] [16:-1036.20%] [N/A:N/A] Classification: [15:Full Thickness Without Exposed Support Structures] [16:Category/Stage III] [N/A:N/A] Exudate Amount: [15:Large] [16:Medium] [N/A:N/A] Exudate Type: [15:Purulent] [16:Purulent] [N/A:N/A] Exudate Color: [15:yellow, brown, green] [16:yellow, brown, green] [N/A:N/A] Wound Margin: [15:Flat and Intact] [16:Indistinct, nonvisible] [N/A:N/A] Granulation Amount: [15:None Present (0%)] [16:Small (1-33%)] [N/A:N/A] Granulation Quality: [15:N/A] [16:Pale] [N/A:N/A] Necrotic  Amount: [15:Large (67-100%)] [16:Large (67-100%)] [N/A:N/A] Necrotic Tissue: [15:Adherent Slough] [16:Eschar, Adherent Slough] [N/A:N/A] Exposed Structures: [15:Fat Layer (Subcutaneous Tissue) Exposed: Yes Fascia: No Tendon: No Muscle: No Joint: No Bone: No] [16:Fat Layer (Subcutaneous Tissue) Exposed: Yes Fascia: No Tendon: No Muscle: No Joint: No Bone: No] [N/A:N/A] Epithelialization: [15:None] [16:Small (1-33%)] [N/A:N/A] Periwound Skin Texture: [N/A:N/A] Excoriation: No Excoriation: No Induration: No Induration: No Callus: No Callus: No Crepitus: No Crepitus: No Rash: No Rash: No Scarring: No Scarring: No Periwound Skin Moisture: Maceration: Yes Maceration: No N/A Dry/Scaly: No Dry/Scaly: No Periwound Skin Color: Erythema: Yes Atrophie Blanche: No N/A Atrophie Blanche: No Cyanosis: No Cyanosis: No Ecchymosis: No Ecchymosis: No Erythema: No Hemosiderin Staining: No Hemosiderin Staining: No Mottled: No Mottled: No Pallor: No Pallor: No Rubor: No Rubor: No Erythema Location: Circumferential N/A N/A Temperature: No Abnormality No Abnormality N/A Tenderness on Palpation: Yes Yes N/A Wound Preparation: Ulcer Cleansing: Ulcer Cleansing: N/A Rinsed/Irrigated with Saline Rinsed/Irrigated with Saline Topical Anesthetic Applied: Topical Anesthetic Applied: Other: lidocaine 4% Other: lidocaine 4% Treatment Notes Electronic Signature(s) Signed: 05/07/2018 5:03:48 PM By: Montey Hora Entered By: Montey Hora on 05/07/2018 11:34:49 [name], [name] ([id number]) -------------------------------------------------------------------------------- [place] Details Patient Name: [name]. Date of Service: 05/07/2018 10:45 AM Medical Record Number: [id number] Patient Account Number: [id number] Date of Birth/Sex: Dec 10, 1932 (85 y.o. F) Treating RN: Montey Hora Primary Care Devonta Blanford: Emily Filbert Other Clinician: Referring Irby Fails: Emily Filbert Treating  Kenyetta Fife/Extender: Melburn Hake, HOYT Weeks in Treatment: 7 Active Inactive Abuse / Safety / Falls / Self Care Management Nursing Diagnoses: Potential for falls Goals: Patient will not develop complications from immobility Date Initiated: 03/19/2018 Target Resolution Date: 06/22/2018 Goal Status: Active Interventions: Assess fall risk on admission and as needed Notes: Nutrition Nursing Diagnoses: Potential for alteratiion in Nutrition/Potential for imbalanced nutrition Goals: Patient/caregiver agrees to and verbalizes understanding of need to use nutritional supplements and/or vitamins as prescribed Date Initiated: 03/19/2018 Target Resolution Date: 06/22/2018 Goal Status: Active Interventions: Assess patient nutrition upon admission and as needed per policy Notes: Orientation to the Wound Care Program Nursing Diagnoses: Knowledge deficit related to the wound healing center program Goals: Patient/caregiver will verbalize understanding of the [facility] Program Date Initiated: 03/19/2018 Target Resolution Date: 06/22/2018 Goal Status: Active Interventions: Provide education on orientation to the wound center [name], Latoya A. ([id number]) Notes: Pressure Nursing Diagnoses: Potential for impaired tissue integrity related to pressure, friction, moisture, and shear Goals: Patient will remain free from development of additional pressure ulcers Date Initiated: 03/19/2018 Target Resolution Date: 06/22/2018 Goal Status: Active Interventions: Assess potential for pressure ulcer upon admission and as needed Provide education on pressure ulcers Notes:  Wound/Skin Impairment Nursing Diagnoses: Impaired tissue integrity Goals: Ulcer/skin breakdown will heal within 14 weeks Date Initiated: 03/19/2018 Target Resolution Date: 06/22/2018 Goal Status: Active Interventions: Assess patient/caregiver ability to obtain necessary supplies Assess patient/caregiver ability to perform ulcer/skin  care regimen upon admission and as needed Assess ulceration(s) every visit Notes: Electronic Signature(s) Signed: 05/07/2018 5:03:48 PM By: Montey Hora Entered By: Montey Hora on 05/07/2018 11:34:40 [name], [name] ([id number]) -------------------------------------------------------------------------------- Pain Assessment Details Patient Name: [name]. Date of Service: 05/07/2018 10:45 AM Medical Record Number: [id number] Patient Account Number: [id number] Date of Birth/Sex: 09/17/1932 (85 y.o. F) Treating RN: Montey Hora Primary Care Lylah Lantis: Emily Filbert Other Clinician: Referring Acheron Sugg: Emily Filbert Treating Daren Doswell/Extender: Melburn Hake, HOYT Weeks in Treatment: 7 Active Problems Location of Pain Severity and Description of Pain Patient Has Paino Yes Site Locations Rate the pain. Current Pain Level: 7 Pain Management and Medication Current Pain Management: Electronic Signature(s) Signed: 05/07/2018 4:33:45 PM By: Lorine Bears RCP, RRT, CHT Signed: 05/07/2018 5:03:48 PM By: Montey Hora Entered By: Lorine Bears on 05/07/2018 10:51:04 Eley, [name] ([id number]) -------------------------------------------------------------------------------- Patient/Caregiver Education Details Patient Name: [name] Date of Service: 05/07/2018 10:45 AM Medical Record Number: [id number] Patient Account Number: [id number] Date of Birth/Gender: [birth year] (85 y.o. F) Treating RN: Montey Hora Primary Care Physician: Emily Filbert Other Clinician: Referring Physician: Emily Filbert Treating Physician/Extender: Sharalyn Ink in Treatment: 7 Education Assessment Education Provided To: Patient and Caregiver Education Topics Provided Wound/Skin Impairment: Handouts: Other: wound care as ordered Methods: Demonstration, Explain/Verbal Responses: State content correctly Electronic Signature(s) Signed: 05/07/2018 5:03:48 PM By: Montey Hora Entered By: Montey Hora on 05/07/2018 12:44:22 [name], [name] ([id number]) -------------------------------------------------------------------------------- Wound Assessment Details Patient Name: [name]. Date of Service: 05/07/2018 10:45 AM Medical Record Number: [id number] Patient Account Number: [id number] Date of Birth/Sex: 10-04-1932 (85 y.o. F) Treating RN: Secundino Ginger Primary Care Saoirse Legere: Emily Filbert Other Clinician: Referring Enya Bureau: Emily Filbert Treating Chloe Miyoshi/Extender: Melburn Hake, HOYT Weeks in Treatment: 7 Wound Status Wound Number: 15 Primary Skin Tear Etiology: Wound Location: Left Lower Leg - Anterior, Distal Wound Open Wounding Event: Shear/Friction Status: Date Acquired: 03/05/2018 Comorbid Cataracts, Anemia, Arrhythmia, Hypertension, Weeks Of Treatment: 7 History: Osteoarthritis, Received Radiation Clustered Wound: No Photos Photo Uploaded By: Secundino Ginger on 05/07/2018 12:33:37 Wound Measurements Length: (cm) 3.5 Width: (cm) 3.8 Depth: (cm) 0.2 Area: (cm) 10.446 Volume: (cm) 2.089 % Reduction in Area: 29.3% % Reduction in Volume: 29.3% Epithelialization: None Tunneling: No Undermining: No Wound Description Full Thickness Without Exposed Support Classification: Structures Wound Margin: Flat and Intact Exudate Large Amount: Exudate Type: Purulent Exudate Color: yellow, brown, green Foul Odor After Cleansing: No Slough/Fibrino Yes Wound Bed Granulation Amount: None Present (0%) Exposed Structure Necrotic Amount: Large (67-100%) Fascia Exposed: No Necrotic Quality: Adherent Slough Fat Layer (Subcutaneous Tissue) Exposed: Yes Tendon Exposed: No Muscle Exposed: No Joint Exposed: No Bone Exposed: No [place], Rebbecca A. ([id number]) Periwound Skin Texture Texture Color No Abnormalities Noted: No No Abnormalities Noted: No Callus: No Atrophie Blanche: No Crepitus: No Cyanosis: No Excoriation: No Ecchymosis: No Induration:  No Erythema: Yes Rash: No Erythema Location: Circumferential Scarring: No Hemosiderin Staining: No Mottled: No Moisture Pallor: No No Abnormalities Noted: No Rubor: No Dry / Scaly: No Maceration: Yes Temperature / Pain Temperature: No Abnormality Tenderness on Palpation: Yes Wound Preparation Ulcer Cleansing: Rinsed/Irrigated with Saline Topical Anesthetic Applied: Other: lidocaine 4%, Treatment Notes Wound #15 (Left, Distal, Anterior Lower Leg) Notes iodoflex to both wounds, ABD and conform secured  with netting Electronic Signature(s) Signed: 05/07/2018 3:42:40 PM By: Secundino Ginger Entered By: Secundino Ginger on 05/07/2018 11:00:57 [name], [name] ([id number]) -------------------------------------------------------------------------------- Wound Assessment Details Patient Name: [name]. Date of Service: 05/07/2018 10:45 AM Medical Record Number: [id number] Patient Account Number: [id number] Date of Birth/Sex: Jan 07, 1933 (85 y.o. F) Treating RN: Secundino Ginger Primary Care Hydie Langan: Emily Filbert Other Clinician: Referring Derell Bruun: Emily Filbert Treating Julieanne Hadsall/Extender: Melburn Hake, HOYT Weeks in Treatment: 7 Wound Status Wound Number: 16 Primary Pressure Ulcer Etiology: Wound Location: Left Calcaneus Wound Open Wounding Event: Pressure Injury Status: Date Acquired: 03/11/2018 Comorbid Cataracts, Anemia, Arrhythmia, Hypertension, Weeks Of Treatment: 7 History: Osteoarthritis, Received Radiation Clustered Wound: No Photos Photo Uploaded By: Secundino Ginger on 05/07/2018 12:32:43 Wound Measurements Length: (cm) 2 Width: (cm) 3.4 Depth: (cm) 0.1 Area: (cm) 5.341 Volume: (cm) 0.534 % Reduction in Area: -1034% % Reduction in Volume: -1036.2% Epithelialization: Small (1-33%) Tunneling: No Undermining: No Wound Description Classification: Category/Stage III Foul Odor Wound Margin: Indistinct, nonvisible Slough/Fib Exudate Amount: Medium Exudate Type: Purulent Exudate Color:  yellow, brown, green After Cleansing: No rino Yes Wound Bed Granulation Amount: Small (1-33%) Exposed Structure Granulation Quality: Pale Fascia Exposed: No Necrotic Amount: Large (67-100%) Fat Layer (Subcutaneous Tissue) Exposed: Yes Necrotic Quality: Eschar, Adherent Slough Tendon Exposed: No Muscle Exposed: No Joint Exposed: No Bone Exposed: No Periwound Skin Texture Campau, Tyquasia A. ([id number]) Texture Color No Abnormalities Noted: No No Abnormalities Noted: No Callus: No Atrophie Blanche: No Crepitus: No Cyanosis: No Excoriation: No Ecchymosis: No Induration: No Erythema: No Rash: No Hemosiderin Staining: No Scarring: No Mottled: No Pallor: No Moisture Rubor: No No Abnormalities Noted: No Dry / Scaly: No Temperature / Pain Maceration: No Temperature: No Abnormality Tenderness on Palpation: Yes Wound Preparation Ulcer Cleansing: Rinsed/Irrigated with Saline Topical Anesthetic Applied: Other: lidocaine 4%, Treatment Notes Wound #16 (Left Calcaneus) Notes iodoflex to both wounds, ABD and conform secured with netting Electronic Signature(s) Signed: 05/07/2018 3:42:40 PM By: Secundino Ginger Entered By: Secundino Ginger on 05/07/2018 11:01:24 Brickel, [name] ([id number]) -------------------------------------------------------------------------------- [place] Details Patient Name: [name]. Date of Service: 05/07/2018 10:45 AM Medical Record Number: [id number] Patient Account Number: [id number] Date of Birth/Sex: October 22, 1932 (85 y.o. F) Treating RN: Montey Hora Primary Care Loreen Bankson: Emily Filbert Other Clinician: Referring Willetta York: Emily Filbert Treating Rogers Ditter/Extender: Melburn Hake, HOYT Weeks in Treatment: 7 Vital Signs Time Taken: 10:51 Temperature (F): 97.9 Height (in): 68 Pulse (bpm): 69 Weight (lbs): 121 Respiratory Rate (breaths/min): 18 Body Mass Index (BMI): 18.4 Blood Pressure (mmHg): 120/32 Reference Range: 80 - 120 mg / dl Notes Patient states she just  does not feel good today. Patient fell out of the bed and bumped the back of her head on the headboard. This occurred on 05/04/18. Electronic Signature(s) Signed: 05/07/2018 4:33:45 PM By: Lorine Bears RCP, RRT, CHT Entered By: Lorine Bears on 05/07/2018 10:58:26

## 2018-05-09 NOTE — Progress Notes (Signed)
JAQUASIA, DOSCHER ([id number]) Visit Report for 05/07/2018 Chief Complaint Document Details Patient Name: [name], [name]. Date of Service: 05/07/2018 10:45 AM Medical Record Number: [id number] Patient Account Number: [id number] Date of Birth/Sex: 1932-05-31 (85 y.o. F) Treating RN: Montey Hora Primary Care Provider: Emily Filbert Other Clinician: Referring Provider: Emily Filbert Treating Provider/Extender: Melburn Hake, HOYT Weeks in Treatment: 7 Information Obtained from: Patient Chief Complaint Left heel DTI and left LE ulcer Electronic Signature(s) Signed: 05/09/2018 9:23:56 AM By: Worthy Keeler PA-C Entered By: Worthy Keeler on 05/07/2018 10:55:00 Marple, [name] ([id number]) -------------------------------------------------------------------------------- HPI Details Patient Name: [name] Date of Service: 05/07/2018 10:45 AM Medical Record Number: [id number] Patient Account Number: [id number] Date of Birth/Sex: 06-Oct-1932 (85 y.o. F) Treating RN: Montey Hora Primary Care Provider: Emily Filbert Other Clinician: Referring Provider: Emily Filbert Treating Provider/Extender: Melburn Hake, HOYT Weeks in Treatment: 7 History of Present Illness HPI Description: 85 year old patient who is known to the wound center for several years now has a new injury to the right lower extremity where she bumped herself against the bath tub. He also has had a chronic right lateral malleolus open wound which has been coming on and off for several years now. This has been there for at least 2014 and was healed out in November 2016. In the past she has been seen by her cardiologist Dr. Kathlyn Sacramento in April 2016. Noninvasive vascular evaluation showed an ABI of 0.68 on the right and 0.85 on the left. 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 One-vessel runoff below the knee via the peroneal artery with short occlusion of the anterior tibial  artery and reconstitution. No intervention performed. Most recently she was seen for a wound on her right lower extremity which was healed out by March 15 She is not a smoker and has had no diabetes mellitus. 07/29/2015 -- the appointment with her cardiologist Dr. Fletcher Anon is still pending for review of her arterial duplex studies. Readmission: 03/19/18 on evaluation today patient presents for readmission concerning an issue she has been having with her left heel and left anterior lower extremity. She has a history of atrial fibrillation and had knee surgery for a total knee replacement three weeks ago today. Subsequently since being discharged from the hospital it was noted that she did have a left heel deep tissue injury as well the left anterior lower extremity ulcer. This has been present apparently for roughly 2 weeks. She does not appear to have any evidence of infection which is good news. She has been tolerating the dressing changes that have been performed it sounds as if Santyl is what has been used at least most recently. Obviously I think this is likely a good treatment choice for her. Especially in light of the fact that the wound on the distal lower extremity of the left leg appears to show signs of significant slough/eschar buildup which is starting to loosen up now that she is using the central but prior was fairly significant as far as how hard and black it was. She does have pain at both sites the open wound more than the deep tissue injury. Otherwise the patient does have a history of peripheral vascular disease, congestive heart failure, chronic kidney disease stage IV, and now the presence of a left artificial knee joint. 03/28/18 on evaluation today patient appears to be doing a little worse in regard to the dorsal surface of her lower extremity. She has  been tolerating the dressing changes without complication although she states there's a lot of pain at this  location. Subsequently in regard to her left heel the show signs of continued to drain with maceration noted. I think that some of the dead tissue needs to be removed from the surface of the wound at the site to allow it to heal appropriately. 04/02/18 on evaluation today patient's wounds actually appear to be doing better at this point in regard to the dorsal wound on the anterior lower leg as well as the heel ulcer. She does have an area of thicker eschar of the heel which I think actually needs to be loosened up I think cental or Iodoflex could be beneficial in this regard. Otherwise debridement will likely be undertaken today over the lower leg region in order to clear away some of the necrotic material. 04/09/18 on evaluation today a lot of the skin which is around the patient's heel ulcer actually is lifting off which is good news. There is some Slough noted on the surface of one centrally this is going to require sharp debridement. The dorsal surface of her foot wound actually appears to be doing much better which is great news. Overall I'm fairly pleased with how things seem to be progressing. The patient likewise is worried about her heel but otherwise feels like things are going fairly well. 04/23/18 on evaluation today patient appears to be doing fairly well in regard to her ulcer. She has been tolerating the dressing changes with the Santyl and overall I do believe this has been of benefit. With that being said she is still having a lot of issues with pain on the dorsal aspect of her foot although I do believe she's slowly making progress which is good news. No fevers, chills, nausea, or vomiting noted at this time. OMNI, DUNSWORTH ([id number]) 04/30/18 on evaluation today patient unfortunately appears to be doing a little bit worse I'm concerned about cellulitis around her wounds. Fortunately there does not appear to be any evidence of infection at this time which is good news. That  is systemically. Unfortunately she does have increased pain. 05/07/18 on evaluation today patient appears to be doing decently well in regard to her wounds at this point. With that being said she tells me that she had a fall where she actually struck her head on Saturday roughly 72 hours ago. With that being said she doesn't note any dizziness or confusion but has had a headache. She has continued to take the Keflex which was prescribed for her. She is on Eliquis which obviously is somewhat of a concern. The other issue is that upon evaluation today she seemed to have a very interesting issue going on with her blood pressure we were able to obtain a systolic blood pressure manually though the diastolic pressure was around 32 with the machine and when I took this manually I could hear the diastolic sounds even when the cough was completely deflated and in fact even when it was removed from her arm. This was a phenomenon that I had not seen previous. For this reason I was concerned enough especially with a history of head trauma to contact her cardiologist, Dr. Sophronia Simas, however he is out of the office at this point due to his wife having had a baby. Nonetheless I did end up speaking with the cardiologist on call and his recommendation was more or less an echo of what I was concerned about which was the patient  should likely go to the hospital to have things checked out especially with her history of anticoagulant therapy. Electronic Signature(s) Signed: 05/09/2018 9:23:56 AM By: Worthy Keeler PA-C Entered By: Worthy Keeler on 05/07/2018 15:40:21 Kincer, [name] ([id number]) -------------------------------------------------------------------------------- Physical Exam Details Patient Name: [name]. Date of Service: 05/07/2018 10:45 AM Medical Record Number: [id number] Patient Account Number: [id number] Date of Birth/Sex: 1932/09/14 (85 y.o. F) Treating RN: Montey Hora Primary Care Provider:  Emily Filbert Other Clinician: Referring Provider: Emily Filbert Treating Provider/Extender: Melburn Hake, HOYT Weeks in Treatment: 7 Constitutional sitting or standing blood pressure is within target range for patient.. pulse regular and within target range for patient.Marland Kitchen respirations regular, non-labored and within target range for patient.Marland Kitchen temperature within target range for patient.. Well- nourished and well-hydrated in no acute distress. Eyes conjunctiva clear no eyelid edema noted. pupils equal round and reactive to light and accommodation. Ears, Nose, Mouth, and Throat no gross abnormality of ear auricles or external auditory canals. normal hearing noted during conversation. mucus membranes moist. Respiratory normal breathing without difficulty. clear to auscultation bilaterally. Cardiovascular regular rate and rhythm with normal S1, S2. Musculoskeletal normal gait and posture. Psychiatric this patient is able to make decisions and demonstrates good insight into disease process. Alert and Oriented x 3. pleasant and cooperative. Notes Upon evaluation today patient's pupils appear to be equal and reactive to light and accommodation. I did not see any evidence of obvious head trauma although she did have some pain right at the occipital region of her skull at the base there did not appear to be any signs of contusion which was good news. With that being said she continues to have headaches right back in the area. Coupled with the abnormal blood pressure readings today I am concerned about the head trauma enough that I did contact your cardiologist. Their recommendation was that she go to the hospital for evaluation of the head trauma. Electronic Signature(s) Signed: 05/09/2018 9:23:56 AM By: Worthy Keeler PA-C Entered By: Worthy Keeler on 05/07/2018 15:41:57 Ell, [name] ([id number]) -------------------------------------------------------------------------------- Physician Orders  Details Patient Name: [name] Date of Service: 05/07/2018 10:45 AM Medical Record Number: [id number] Patient Account Number: [id number] Date of Birth/Sex: 1932-10-06 (85 y.o. F) Treating RN: Montey Hora Primary Care Provider: Emily Filbert Other Clinician: Referring Provider: Emily Filbert Treating Provider/Extender: Melburn Hake, HOYT Weeks in Treatment: 7 Verbal / Phone Orders: No Diagnosis Coding ICD-10 Coding Code Description S81.802A Unspecified open wound, left lower leg, initial encounter L97.822 Non-pressure chronic ulcer of other part of left lower leg with fat layer exposed L89.626 Pressure-induced deep tissue damage of left heel I73.9 Peripheral vascular disease, unspecified I50.42 Chronic combined systolic (congestive) and diastolic (congestive) heart failure N18.4 Chronic kidney disease, stage 4 (severe) Y63.785 Presence of left artificial knee joint Wound Cleansing Wound #15 Left,Distal,Anterior Lower Leg o Cleanse wound with mild soap and water Wound #16 Left Calcaneus o Cleanse wound with mild soap and water Primary Wound Dressing Wound #15 Left,Distal,Anterior Lower Leg o Iodoflex Wound #16 Left Calcaneus o Iodoflex Secondary Dressing Wound #15 Left,Distal,Anterior Lower Leg o Gauze, ABD and Kerlix/Conform - secure with netting Wound #16 Left Calcaneus o Gauze, ABD and Kerlix/Conform - secure with netting Dressing Change Frequency Wound #15 Left,Distal,Anterior Lower Leg o Change dressing every other day. Wound #16 Left Calcaneus o Change dressing every other day. Follow-up Appointments Wound #15 Left,Distal,Anterior Lower Leg o Return Appointment in 1 week. SINCLAIR, ARRAZOLA ([id number]) Wound #16 Left Calcaneus o  Return Appointment in 1 week. Off-Loading Wound #16 Left Calcaneus o Other: - Please continue wearing your boot to protect your heel Electronic Signature(s) Signed: 05/07/2018 5:03:48 PM By: Montey Hora Signed: 05/09/2018  9:23:56 AM By: Worthy Keeler PA-C Entered By: Montey Hora on 05/07/2018 11:38:25 Kempfer, [name] ([id number]) -------------------------------------------------------------------------------- Problem List Details Patient Name: [name]. Date of Service: 05/07/2018 10:45 AM Medical Record Number: [id number] Patient Account Number: [id number] Date of Birth/Sex: Jul 29, 1932 (85 y.o. F) Treating RN: Montey Hora Primary Care Provider: Emily Filbert Other Clinician: Referring Provider: Emily Filbert Treating Provider/Extender: Melburn Hake, HOYT Weeks in Treatment: 7 Active Problems ICD-10 Evaluated Encounter Code Description Active Date Today Diagnosis S81.802A Unspecified open wound, left lower leg, initial encounter 03/19/2018 No Yes L97.822 Non-pressure chronic ulcer of other part of left lower leg with 03/19/2018 No Yes fat layer exposed L89.626 Pressure-induced deep tissue damage of left heel 03/19/2018 No Yes I73.9 Peripheral vascular disease, unspecified 03/19/2018 No Yes I50.42 Chronic combined systolic (congestive) and diastolic 99/06/7167 No Yes (congestive) heart failure N18.4 Chronic kidney disease, stage 4 (severe) 03/19/2018 No Yes Z96.652 Presence of left artificial knee joint 03/19/2018 No Yes Inactive Problems Resolved Problems Electronic Signature(s) Signed: 05/09/2018 9:23:56 AM By: Worthy Keeler PA-C Entered By: Worthy Keeler on 05/07/2018 10:54:54 Varano, [name] ([id number]) -------------------------------------------------------------------------------- Progress Note/History and Physical Details Patient Name: [name]. Date of Service: 05/07/2018 10:45 AM Medical Record Number: [id number] Patient Account Number: [id number] Date of Birth/Sex: Dec 09, 1932 (85 y.o. F) Treating RN: Montey Hora Primary Care Provider: Emily Filbert Other Clinician: Referring Provider: Emily Filbert Treating Provider/Extender: Melburn Hake, HOYT Weeks in Treatment: 7 Subjective Chief  Complaint Information obtained from Patient Left heel DTI and left LE ulcer History of Present Illness (HPI) 84 year old patient who is known to the wound center for several years now has a new injury to the right lower extremity where she bumped herself against the bath tub. He also has had a chronic right lateral malleolus open wound which has been coming on and off for several years now. This has been there for at least 2014 and was healed out in November 2016. In the past she has been seen by her cardiologist Dr. Kathlyn Sacramento in April 2016. Noninvasive vascular evaluation showed an ABI of 0.68 on the right and 0.85 on the left. 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 One-vessel runoff below the knee via the peroneal artery with short occlusion of the anterior tibial artery and reconstitution. No intervention performed. Most recently she was seen for a wound on her right lower extremity which was healed out by March 15 She is not a smoker and has had no diabetes mellitus. 07/29/2015 -- the appointment with her cardiologist Dr. Fletcher Anon is still pending for review of her arterial duplex studies. Readmission: 03/19/18 on evaluation today patient presents for readmission concerning an issue she has been having with her left heel and left anterior lower extremity. She has a history of atrial fibrillation and had knee surgery for a total knee replacement three weeks ago today. Subsequently since being discharged from the hospital it was noted that she did have a left heel deep tissue injury as well the left anterior lower extremity ulcer. This has been present apparently for roughly 2 weeks. She does not appear to have any evidence of infection which is good news. She has been tolerating the dressing changes that have been performed it sounds as if  Santyl is what has been used at least most recently. Obviously I think this is  likely a good treatment choice for her. Especially in light of the fact that the wound on the distal lower extremity of the left leg appears to show signs of significant slough/eschar buildup which is starting to loosen up now that she is using the central but prior was fairly significant as far as how hard and black it was. She does have pain at both sites the open wound more than the deep tissue injury. Otherwise the patient does have a history of peripheral vascular disease, congestive heart failure, chronic kidney disease stage IV, and now the presence of a left artificial knee joint. 03/28/18 on evaluation today patient appears to be doing a little worse in regard to the dorsal surface of her lower extremity. She has been tolerating the dressing changes without complication although she states there's a lot of pain at this location. Subsequently in regard to her left heel the show signs of continued to drain with maceration noted. I think that some of the dead tissue needs to be removed from the surface of the wound at the site to allow it to heal appropriately. 04/02/18 on evaluation today patient's wounds actually appear to be doing better at this point in regard to the dorsal wound on the anterior lower leg as well as the heel ulcer. She does have an area of thicker eschar of the heel which I think actually needs to be loosened up I think cental or Iodoflex could be beneficial in this regard. Otherwise debridement will likely be undertaken today over the lower leg region in order to clear away some of the necrotic material. 04/09/18 on evaluation today a lot of the skin which is around the patient's heel ulcer actually is lifting off which is good news. There is some Slough noted on the surface of one centrally this is going to require sharp debridement. The dorsal surface of her foot wound actually appears to be doing much better which is great news. Overall I'm fairly pleased with how  things seem to be progressing. The patient likewise is worried about her heel but otherwise feels like things are going fairly well. KYNSIE, FALKNER ([id number]) 04/23/18 on evaluation today patient appears to be doing fairly well in regard to her ulcer. She has been tolerating the dressing changes with the Santyl and overall I do believe this has been of benefit. With that being said she is still having a lot of issues with pain on the dorsal aspect of her foot although I do believe she's slowly making progress which is good news. No fevers, chills, nausea, or vomiting noted at this time. 04/30/18 on evaluation today patient unfortunately appears to be doing a little bit worse I'm concerned about cellulitis around her wounds. Fortunately there does not appear to be any evidence of infection at this time which is good news. That is systemically. Unfortunately she does have increased pain. 05/07/18 on evaluation today patient appears to be doing decently well in regard to her wounds at this point. With that being said she tells me that she had a fall where she actually struck her head on Saturday roughly 72 hours ago. With that being said she doesn't note any dizziness or confusion but has had a headache. She has continued to take the Keflex which was prescribed for her. She is on Eliquis which obviously is somewhat of a concern. The other issue is that upon  evaluation today she seemed to have a very interesting issue going on with her blood pressure we were able to obtain a systolic blood pressure manually though the diastolic pressure was around 32 with the machine and when I took this manually I could hear the diastolic sounds even when the cough was completely deflated and in fact even when it was removed from her arm. This was a phenomenon that I had not seen previous. For this reason I was concerned enough especially with a history of head trauma to contact her cardiologist, Dr. Sophronia Simas, however he is  out of the office at this point due to his wife having had a baby. Nonetheless I did end up speaking with the cardiologist on call and his recommendation was more or less an echo of what I was concerned about which was the patient should likely go to the hospital to have things checked out especially with her history of anticoagulant therapy. Wound History Patient presents with 1 open wound that has been present for approximately 2 weeks. Laboratory tests have been performed in the last month. Patient reportedly has tested positive for an antibiotic resistant organism. Patient reportedly has not tested positive for osteomyelitis. Patient reportedly has not had testing performed to evaluate circulation in the legs. Patient History Information obtained from Patient. Family History Cancer - Siblings, Heart Disease - Father, Hypertension - Siblings, Stroke - Mother, No family history of Hereditary Spherocytosis, Kidney Disease, Lung Disease, Seizures, Thyroid Problems, Tuberculosis. Social History Never smoker, Marital Status - Married, Alcohol Use - Never, Drug Use - No History, Caffeine Use - Daily. Medical History Eyes Patient has history of Cataracts Denies history of Glaucoma, Optic Neuritis Ear/Nose/Mouth/Throat Denies history of Chronic sinus problems/congestion, Middle ear problems Hematologic/Lymphatic Patient has history of Anemia Denies history of Hemophilia, Human Immunodeficiency Virus, Lymphedema, Sickle Cell Disease Respiratory Denies history of Aspiration, Asthma, Chronic Obstructive Pulmonary Disease (COPD), Pneumothorax, Sleep Apnea, Tuberculosis Cardiovascular Patient has history of Arrhythmia - A fib, Hypertension Denies history of Angina, Congestive Heart Failure, Coronary Artery Disease, Hypotension, Myocardial Infarction, Peripheral Arterial Disease, Peripheral Venous Disease, Phlebitis, Vasculitis Gastrointestinal Denies history of Cirrhosis , Colitis, Crohn s,  Hepatitis A, Hepatitis B, Hepatitis C Endocrine Denies history of Type I Diabetes, Type II Diabetes Genitourinary Denies history of End Stage Renal Disease Blakesley, AMARRA SAWYER ([id number]) Immunological Denies history of Lupus Erythematosus, Raynaud s, Scleroderma Integumentary (Skin) Denies history of History of Burn, History of pressure wounds Musculoskeletal Patient has history of Osteoarthritis Denies history of Gout, Rheumatoid Arthritis Neurologic Denies history of Dementia, Neuropathy, Quadriplegia, Paraplegia, Seizure Disorder Oncologic Patient has history of Received Radiation Denies history of Received Chemotherapy Psychiatric Denies history of Anorexia/bulimia, Confinement Anxiety Hospitalization/Surgery History - 09/16/2014, [facility], (R) knee replacement. Medical And Surgical History Notes Ear/Nose/Mouth/Throat HOH- hearing aids Genitourinary frequent UTIs Oncologic Breast Ca- R mastectomy Review of Systems (ROS) Constitutional Symptoms (General Health) Denies complaints or symptoms of Fever, Chills. Respiratory The patient has no complaints or symptoms. Cardiovascular The patient has no complaints or symptoms. Psychiatric The patient has no complaints or symptoms. Objective Constitutional sitting or standing blood pressure is within target range for patient.. pulse regular and within target range for patient.Marland Kitchen respirations regular, non-labored and within target range for patient.Marland Kitchen temperature within target range for patient.. Well- nourished and well-hydrated in no acute distress. Vitals Time Taken: 10:51 AM, Height: 68 in, Weight: 121 lbs, BMI: 18.4, Temperature: 97.9 F, Pulse: 69 bpm, Respiratory Rate: 18 breaths/min, Blood Pressure: 120/32 mmHg. General Notes: Patient  states she just does not feel good today. Patient fell out of the bed and bumped the back of her head on the headboard. This occurred on 05/04/18. Eyes conjunctiva clear no eyelid edema noted. pupils  equal round and reactive to light and accommodation. DESTENIE, INGBER ([id number]) Ears, Nose, Mouth, and Throat no gross abnormality of ear auricles or external auditory canals. normal hearing noted during conversation. mucus membranes moist. Respiratory normal breathing without difficulty. clear to auscultation bilaterally. Cardiovascular regular rate and rhythm with normal S1, S2. Musculoskeletal normal gait and posture. Psychiatric this patient is able to make decisions and demonstrates good insight into disease process. Alert and Oriented x 3. pleasant and cooperative. General Notes: Upon evaluation today patient's pupils appear to be equal and reactive to light and accommodation. I did not see any evidence of obvious head trauma although she did have some pain right at the occipital region of her skull at the base there did not appear to be any signs of contusion which was good news. With that being said she continues to have headaches right back in the area. Coupled with the abnormal blood pressure readings today I am concerned about the head trauma enough that I did contact your cardiologist. Their recommendation was that she go to the hospital for evaluation of the head trauma. Integumentary (Hair, Skin) Wound #15 status is Open. Original cause of wound was Shear/Friction. The wound is located on the [facility] Lower Leg. The wound measures 3.5cm length x 3.8cm width x 0.2cm depth; 10.446cm^2 area and 2.089cm^3 volume. There is Fat Layer (Subcutaneous Tissue) Exposed exposed. There is no tunneling or undermining noted. There is a large amount of purulent drainage noted. The wound margin is flat and intact. There is no granulation within the wound bed. There is a large (67-100%) amount of necrotic tissue within the wound bed including Adherent Slough. The periwound skin appearance exhibited: Maceration, Erythema. The periwound skin appearance did not exhibit: Callus, Crepitus,  Excoriation, Induration, Rash, Scarring, Dry/Scaly, Atrophie Blanche, Cyanosis, Ecchymosis, Hemosiderin Staining, Mottled, Pallor, Rubor. The surrounding wound skin color is noted with erythema which is circumferential. Periwound temperature was noted as No Abnormality. The periwound has tenderness on palpation. Wound #16 status is Open. Original cause of wound was Pressure Injury. The wound is located on the Left Calcaneus. The wound measures 2cm length x 3.4cm width x 0.1cm depth; 5.341cm^2 area and 0.534cm^3 volume. There is Fat Layer (Subcutaneous Tissue) Exposed exposed. There is no tunneling or undermining noted. There is a medium amount of purulent drainage noted. The wound margin is indistinct and nonvisible. There is small (1-33%) pale granulation within the wound bed. There is a large (67-100%) amount of necrotic tissue within the wound bed including Eschar and Adherent Slough. The periwound skin appearance did not exhibit: Callus, Crepitus, Excoriation, Induration, Rash, Scarring, Dry/Scaly, Maceration, Atrophie Blanche, Cyanosis, Ecchymosis, Hemosiderin Staining, Mottled, Pallor, Rubor, Erythema. Periwound temperature was noted as No Abnormality. The periwound has tenderness on palpation. Assessment Active Problems ICD-10 Unspecified open wound, left lower leg, initial encounter Non-pressure chronic ulcer of other part of left lower leg with fat layer exposed Pressure-induced deep tissue damage of left heel Peripheral vascular disease, unspecified Chronic combined systolic (congestive) and diastolic (congestive) heart failure Chronic kidney disease, stage 4 (severe) Jewkes, Kayde A. ([id number]) Presence of left artificial knee joint Plan Wound Cleansing: Wound #15 Left,Distal,Anterior Lower Leg: Cleanse wound with mild soap and water Wound #16 Left Calcaneus: Cleanse wound with mild soap and water Primary Wound  Dressing: Wound #15 Left,Distal,Anterior Lower Leg: Iodoflex Wound  #16 Left Calcaneus: Iodoflex Secondary Dressing: Wound #15 Left,Distal,Anterior Lower Leg: Gauze, ABD and Kerlix/Conform - secure with netting Wound #16 Left Calcaneus: Gauze, ABD and Kerlix/Conform - secure with netting Dressing Change Frequency: Wound #15 Left,Distal,Anterior Lower Leg: Change dressing every other day. Wound #16 Left Calcaneus: Change dressing every other day. Follow-up Appointments: Wound #15 Left,Distal,Anterior Lower Leg: Return Appointment in 1 week. Wound #16 Left Calcaneus: Return Appointment in 1 week. Off-Loading: Wound #16 Left Calcaneus: Other: - Please continue wearing your boot to protect your heel My suggestion at this point is gonna be that we continue to manage the patient's wounds per the above wound care measures. This will be a switch and we will see how this does hopefully the Iodoflex will help to manage her biofilm buildup better. She's in agreement that plan. Please see above for specific wound care orders. We will see patient for re-evaluation in 1 week(s) here in the clinic. If anything worsens or changes patient will contact our office for additional recommendations. after discussing with her cardiologist I do think that the best idea would be for her to go to the ER for evaluation of the head trauma to ensure this blood pressure reading and phenomenon is not in any way related to her head trauma. Especially with her history of anticoagulant therapy with Eliquis. Her cardiology group was in agreement with this plan. Kim did contact her for me and explained that both of our concerns were that she needed to have this checked out to ensure everything is okay if it is then obviously that's great news if it is not we do not want anything to hide and not come to light until it is too late. This was related to the patient although I'm not sure if she is going to actually go to the ER for further evaluation or not. We did stress the importance and  our recommendation for doing so. IDANIA, DESOUZA ([id number]) Electronic Signature(s) Signed: 05/09/2018 9:23:56 AM By: Worthy Keeler PA-C Entered By: Worthy Keeler on 05/07/2018 15:43:43 Braddy, [name] ([id number]) -------------------------------------------------------------------------------- ROS/PFSH Details Patient Name: [name] Date of Service: 05/07/2018 10:45 AM Medical Record Number: [id number] Patient Account Number: [id number] Date of Birth/Sex: [birth year] (85 y.o. F) Treating RN: Montey Hora Primary Care Provider: Emily Filbert Other Clinician: Referring Provider: Emily Filbert Treating Provider/Extender: Melburn Hake, HOYT Weeks in Treatment: 7 Label Progress Note Print Version as History and Physical for this encounter Information Obtained From Patient Wound History Do you currently have one or more open woundso Yes How many open wounds do you currently haveo 1 Approximately how long have you had your woundso 2 weeks Have you had any lab work done in the past montho Yes Have you tested positive for osteomyelitis (bone infection)o No Have you had any tests for circulation on your legso No Constitutional Symptoms (General Health) Complaints and Symptoms: Negative for: Fever; Chills Eyes Medical History: Positive for: Cataracts Negative for: Glaucoma; Optic Neuritis Ear/Nose/Mouth/Throat Medical History: Negative for: Chronic sinus problems/congestion; Middle ear problems Past Medical History Notes: HOH- hearing aids Hematologic/Lymphatic Medical History: Positive for: Anemia Negative for: Hemophilia; Human Immunodeficiency Virus; Lymphedema; Sickle Cell Disease Respiratory Complaints and Symptoms: No Complaints or Symptoms Medical History: Negative for: Aspiration; Asthma; Chronic Obstructive Pulmonary Disease (COPD); Pneumothorax; Sleep Apnea; Tuberculosis Cardiovascular Complaints and Symptoms: No Complaints or Symptoms Wahlen, LARENA OHNEMUS. ([id number]) Medical  History: Positive for: Arrhythmia - A fib; Hypertension Negative  for: Angina; Congestive Heart Failure; Coronary Artery Disease; Hypotension; Myocardial Infarction; Peripheral Arterial Disease; Peripheral Venous Disease; Phlebitis; Vasculitis Gastrointestinal Medical History: Negative for: Cirrhosis ; Colitis; Crohnos; Hepatitis A; Hepatitis B; Hepatitis C Endocrine Medical History: Negative for: Type I Diabetes; Type II Diabetes Genitourinary Medical History: Negative for: End Stage Renal Disease Past Medical History Notes: frequent UTIs Immunological Medical History: Negative for: Lupus Erythematosus; Raynaudos; Scleroderma Integumentary (Skin) Medical History: Negative for: History of Burn; History of pressure wounds Musculoskeletal Medical History: Positive for: Osteoarthritis Negative for: Gout; Rheumatoid Arthritis Neurologic Medical History: Negative for: Dementia; Neuropathy; Quadriplegia; Paraplegia; Seizure Disorder Oncologic Medical History: Positive for: Received Radiation Negative for: Received Chemotherapy Past Medical History Notes: Breast Ca- R mastectomy Psychiatric Complaints and Symptoms: No Complaints or Symptoms Medical History: Negative for: Anorexia/bulimia; Confinement Anxiety Bromell, Ivadell A. ([id number]) HBO Extended History Items Eyes: Cataracts Immunizations Pneumococcal Vaccine: Received Pneumococcal Vaccination: No Tetanus Vaccine: Last tetanus shot: 08/15/2008 Implantable Devices Hospitalization / Surgery History Name of Hospital Purpose of Hospitalization/Surgery Date [facility] (R) knee replacement 09/16/2014 Family and Social History Cancer: Yes - Siblings; Heart Disease: Yes - Father; Hereditary Spherocytosis: No; Hypertension: Yes - Siblings; Kidney Disease: No; Lung Disease: No; Seizures: No; Stroke: Yes - Mother; Thyroid Problems: No; Tuberculosis: No; Never smoker; Marital Status - Married; Alcohol Use: Never; Drug Use: No History; Caffeine  Use: Daily; Financial Concerns: No; Food, Clothing or Shelter Needs: No; Support System Lacking: No; Transportation Concerns: No; Advanced Directives: Yes (Not Provided); Patient does not want information on Advanced Directives; Do not resuscitate: No; Living Will: Yes (Not Provided); Medical Power of Attorney: Yes (Not Provided) Physician Affirmation I have reviewed and agree with the above information. Electronic Signature(s) Signed: 05/07/2018 5:03:48 PM By: Montey Hora Signed: 05/09/2018 9:23:56 AM By: Worthy Keeler PA-C Entered By: Worthy Keeler on 05/07/2018 15:40:46 Loe, [name] ([id number]) -------------------------------------------------------------------------------- SuperBill Details Patient Name: [name]. Date of Service: 05/07/2018 Medical Record Number: [id number] Patient Account Number: [id number] Date of Birth/Sex: 11-29-1932 (85 y.o. F) Treating RN: Montey Hora Primary Care Provider: Emily Filbert Other Clinician: Referring Provider: Emily Filbert Treating Provider/Extender: Melburn Hake, HOYT Weeks in Treatment: 7 Diagnosis Coding ICD-10 Codes Code Description [phone] Unspecified open wound, left lower leg, initial encounter L97.822 Non-pressure chronic ulcer of other part of left lower leg with fat layer exposed L89.626 Pressure-induced deep tissue damage of left heel I73.9 Peripheral vascular disease, unspecified I50.42 Chronic combined systolic (congestive) and diastolic (congestive) heart failure N18.4 Chronic kidney disease, stage 4 (severe) F09.323 Presence of left artificial knee joint Facility Procedures CPT4 Code: 55732202 Description: 99213 - WOUND CARE VISIT-LEV 3 EST PT Modifier: Quantity: 1 Physician Procedures CPT4 Code Description: [id number] 99214 - WC PHYS LEVEL 4 - EST PT ICD-10 Diagnosis Description S81.802A Unspecified open wound, left lower leg, initial encounter L97.822 Non-pressure chronic ulcer of other part of left lower leg wit  L89.626  Pressure-induced deep tissue damage of left heel I73.9 Peripheral vascular disease, unspecified Modifier: h fat layer expos Quantity: 1 ed Electronic Signature(s) Signed: 05/09/2018 9:23:56 AM By: Worthy Keeler PA-C Entered By: Worthy Keeler on 05/07/2018 15:43:55

## 2018-05-12 ENCOUNTER — Other Ambulatory Visit: Payer: Self-pay | Admitting: Adult Health

## 2018-05-14 ENCOUNTER — Encounter: Payer: Medicare HMO | Admitting: Physician Assistant

## 2018-05-14 DIAGNOSIS — L97822 Non-pressure chronic ulcer of other part of left lower leg with fat layer exposed: Secondary | ICD-10-CM | POA: Diagnosis not present

## 2018-05-16 NOTE — Progress Notes (Signed)
[name], [name] ([id number]) Visit Report for 05/14/2018 Arrival Information Details Patient Name: [name], [name]. Date of Service: 05/14/2018 11:00 AM Medical Record Number: [id number] Patient Account Number: [id number] Date of Birth/Sex: 05-Nov-1932 (85 y.o. F) Treating RN: Montey Hora Primary Care Yaneliz Radebaugh: Emily Filbert Other Clinician: Referring Zayed Griffie: Emily Filbert Treating Ival Pacer/Extender: Melburn Hake, HOYT Weeks in Treatment: 8 Visit Information History Since Last Visit Added or deleted any medications: No Patient Arrived: Cane Any new allergies or adverse reactions: No Arrival Time: 10:57 Had a fall or experienced change in No Accompanied By: husband activities of daily living that may affect Transfer Assistance: None risk of falls: Patient Identification Verified: Yes Signs or symptoms of abuse/neglect since last visito No Secondary Verification Process Yes Hospitalized since last visit: No Completed: Implantable device outside of the clinic excluding No Patient Has Alerts: Yes cellular tissue based products placed in the center Patient Alerts: Patient on Blood since last visit: Thinner Has Dressing in Place as Prescribed: Yes Eliquis Pain Present Now: Yes ABI 05/23/17 L.88 R 1.16 Electronic Signature(s) Signed: 05/14/2018 4:05:03 PM By: Lorine Bears RCP, RRT, CHT Entered By: Lorine Bears on 05/14/2018 10:58:31 Tetrault, [name] ([id number]) -------------------------------------------------------------------------------- Encounter Discharge Information Details Patient Name: [name]. Date of Service: 05/14/2018 11:00 AM Medical Record Number: [id number] Patient Account Number: [id number] Date of Birth/Sex: [birth year] (85 y.o. F) Treating RN: Montey Hora Primary Care Nickalas Mccarrick: Emily Filbert Other Clinician: Referring Chanese Hartsough: Emily Filbert Treating Caelum Federici/Extender: Melburn Hake, HOYT Weeks in Treatment: 8 Encounter Discharge Information  Items Post Procedure Vitals Discharge Condition: Stable Temperature (F): 97.9 Ambulatory Status: Ambulatory Pulse (bpm): 67 Discharge Destination: Home Respiratory Rate (breaths/min): 16 Transportation: Private Auto Blood Pressure (mmHg): 148/40 Accompanied By: spouse Schedule Follow-up Appointment: Yes Clinical Summary of Care: Electronic Signature(s) Signed: 05/14/2018 4:47:13 PM By: Montey Hora Entered By: Montey Hora on 05/14/2018 12:02:01 Goldie, [name] ([id number]) -------------------------------------------------------------------------------- Lower Extremity Assessment Details Patient Name: [name]. Date of Service: 05/14/2018 11:00 AM Medical Record Number: [id number] Patient Account Number: [id number] Date of Birth/Sex: 10/08/1932 (85 y.o. F) Treating RN: Secundino Ginger Primary Care Jimmi Sidener: Emily Filbert Other Clinician: Referring Maxamillion Banas: Emily Filbert Treating Shaunak Kreis/Extender: Melburn Hake, HOYT Weeks in Treatment: 8 Edema Assessment Assessed: [Left: No] [Right: No] [Left: Edema] [Right: :] Calf Left: Right: Point of Measurement: 31 cm From Medial Instep 27 cm cm Ankle Left: Right: Point of Measurement: 12 cm From Medial Instep 21 cm cm Vascular Assessment Claudication: Claudication Assessment [Left:None] Pulses: Dorsalis Pedis Palpable: [Left:Yes] Posterior Tibial Extremity colors, hair growth, and conditions: Extremity Color: [Left:Hyperpigmented] Hair Growth on Extremity: [Left:No] Temperature of Extremity: [Left:Warm] Capillary Refill: [Left:< 3 seconds] Toe Nail Assessment Left: Right: Thick: No Discolored: No Deformed: No Improper Length and Hygiene: No Electronic Signature(s) Signed: 05/14/2018 11:27:43 AM By: Secundino Ginger Entered By: Secundino Ginger on 05/14/2018 11:13:07 Vestal, [name] ([id number]) -------------------------------------------------------------------------------- Multi Wound Chart Details Patient Name: [name]. Date of Service:  05/14/2018 11:00 AM Medical Record Number: [id number] Patient Account Number: [id number] Date of Birth/Sex: 05/25/1932 (85 y.o. F) Treating RN: Montey Hora Primary Care Arnelle Nale: Emily Filbert Other Clinician: Referring Nailah Luepke: Emily Filbert Treating Melony Tenpas/Extender: Melburn Hake, HOYT Weeks in Treatment: 8 Vital Signs Height(in): 68 Pulse(bpm): 79 Weight(lbs): 121 Blood Pressure(mmHg): 148/40 Body Mass Index(BMI): 18 Temperature(F): 97.9 Respiratory Rate 18 (breaths/min): Photos: [N/A:N/A] Wound Location: Left Lower Leg - Anterior, Left Calcaneus N/A Distal Wounding Event: Shear/Friction Pressure Injury N/A Primary Etiology: Skin Tear Pressure Ulcer N/A Comorbid History: Cataracts, Anemia, Cataracts, Anemia, N/A  Arrhythmia, Hypertension, Arrhythmia, Hypertension, Osteoarthritis, Received Osteoarthritis, Received Radiation Radiation Date Acquired: 03/05/2018 03/11/2018 N/A Weeks of Treatment: 8 8 N/A Wound Status: Open Open N/A Measurements L x W x D 3.2x3.5x0.2 2x2.5x0.1 N/A (cm) Area (cm) : 8.796 3.927 N/A Volume (cm) : 1.759 0.393 N/A % Reduction in Area: 40.40% -733.80% N/A % Reduction in Volume: 40.40% -736.20% N/A Classification: Full Thickness Without Category/Stage III N/A Exposed Support Structures Exudate Amount: Medium Medium N/A Exudate Type: Purulent Purulent N/A Exudate Color: yellow, brown, green yellow, brown, green N/A Wound Margin: Flat and Intact Indistinct, nonvisible N/A Granulation Amount: None Present (0%) Small (1-33%) N/A Granulation Quality: N/A Pale N/A Necrotic Amount: Large (67-100%) Large (67-100%) N/A Necrotic Tissue: Adherent Slough Eschar, Adherent Slough N/A Exposed Structures: Fat Layer (Subcutaneous Fat Layer (Subcutaneous N/A Tissue) Exposed: Yes Tissue) Exposed: Yes Bailey, Latoya A. ([id number]) Fascia: No Fascia: No Tendon: No Tendon: No Muscle: No Muscle: No Joint: No Joint: No Bone: No Bone: No Epithelialization:  None Small (1-33%) N/A Periwound Skin Texture: Excoriation: No Excoriation: No N/A Induration: No Induration: No Callus: No Callus: No Crepitus: No Crepitus: No Rash: No Rash: No Scarring: No Scarring: No Periwound Skin Moisture: Maceration: No Maceration: No N/A Dry/Scaly: No Dry/Scaly: No Periwound Skin Color: Erythema: Yes Atrophie Blanche: No N/A Atrophie Blanche: No Cyanosis: No Cyanosis: No Ecchymosis: No Ecchymosis: No Erythema: No Hemosiderin Staining: No Hemosiderin Staining: No Mottled: No Mottled: No Pallor: No Pallor: No Rubor: No Rubor: No Erythema Location: Circumferential N/A N/A Temperature: No Abnormality No Abnormality N/A Tenderness on Palpation: Yes Yes N/A Wound Preparation: Ulcer Cleansing: Ulcer Cleansing: N/A Rinsed/Irrigated with Saline Rinsed/Irrigated with Saline Topical Anesthetic Applied: Topical Anesthetic Applied: Other: lidocaine 4% Other: lidocaine 4% Treatment Notes Electronic Signature(s) Signed: 05/14/2018 4:47:13 PM By: Montey Hora Entered By: Montey Hora on 05/14/2018 11:49:23 [name], [name] ([id number]) -------------------------------------------------------------------------------- [place] Details Patient Name: [name]. Date of Service: 05/14/2018 11:00 AM Medical Record Number: [id number] Patient Account Number: [id number] Date of Birth/Sex: [birth year] (85 y.o. F) Treating RN: Montey Hora Primary Care Angelice Piech: Emily Filbert Other Clinician: Referring Mallorey Odonell: Emily Filbert Treating Caidynce Muzyka/Extender: Melburn Hake, HOYT Weeks in Treatment: 8 Active Inactive Abuse / Safety / Falls / Self Care Management Nursing Diagnoses: Potential for falls Goals: Patient will not develop complications from immobility Date Initiated: 03/19/2018 Target Resolution Date: 06/22/2018 Goal Status: Active Interventions: Assess fall risk on admission and as needed Notes: Nutrition Nursing  Diagnoses: Potential for alteratiion in Nutrition/Potential for imbalanced nutrition Goals: Patient/caregiver agrees to and verbalizes understanding of need to use nutritional supplements and/or vitamins as prescribed Date Initiated: 03/19/2018 Target Resolution Date: 06/22/2018 Goal Status: Active Interventions: Assess patient nutrition upon admission and as needed per policy Notes: Orientation to the Wound Care Program Nursing Diagnoses: Knowledge deficit related to the wound healing center program Goals: Patient/caregiver will verbalize understanding of the [facility] Date Initiated: 03/19/2018 Target Resolution Date: 06/22/2018 Goal Status: Active Interventions: Provide education on orientation to the wound center [name], [name]. ([id number]) Notes: Pressure Nursing Diagnoses: Potential for impaired tissue integrity related to pressure, friction, moisture, and shear Goals: Patient will remain free from development of additional pressure ulcers Date Initiated: 03/19/2018 Target Resolution Date: 06/22/2018 Goal Status: Active Interventions: Assess potential for pressure ulcer upon admission and as needed Provide education on pressure ulcers Notes: Wound/Skin Impairment Nursing Diagnoses: Impaired tissue integrity Goals: Ulcer/skin breakdown will heal within 14 weeks Date Initiated: 03/19/2018 Target Resolution Date: 06/22/2018 Goal Status: Active Interventions: Assess patient/caregiver ability  to obtain necessary supplies Assess patient/caregiver ability to perform ulcer/skin care regimen upon admission and as needed Assess ulceration(s) every visit Notes: Electronic Signature(s) Signed: 05/14/2018 4:47:13 PM By: Montey Hora Entered By: Montey Hora on 05/14/2018 11:49:12 [place], [name] ([id number]) -------------------------------------------------------------------------------- Pain Assessment Details Patient Name: [name]. Date of Service: 05/14/2018 11:00  AM Medical Record Number: [id number] Patient Account Number: [id number] Date of Birth/Sex: 01-05-1933 (85 y.o. F) Treating RN: Montey Hora Primary Care Carrieann Spielberg: Emily Filbert Other Clinician: Referring Kayon Dozier: Emily Filbert Treating Ziyan Schoon/Extender: Melburn Hake, HOYT Weeks in Treatment: 8 Active Problems Location of Pain Severity and Description of Pain Patient Has Paino Yes Site Locations Rate the pain. Current Pain Level: 7 Pain Management and Medication Current Pain Management: Electronic Signature(s) Signed: 05/14/2018 4:05:03 PM By: Paulla Fore, RRT, CHT Signed: 05/14/2018 4:47:13 PM By: Montey Hora Entered By: Lorine Bears on 05/14/2018 10:58:53 Frazee, [name] ([id number]) -------------------------------------------------------------------------------- Patient/Caregiver Education Details Patient Name: [name] Date of Service: 05/14/2018 11:00 AM Medical Record Number: [id number] Patient Account Number: [id number] Date of Birth/Gender: [birth year] (85 y.o. F) Treating RN: Montey Hora Primary Care Physician: Emily Filbert Other Clinician: Referring Physician: Emily Filbert Treating Physician/Extender: Sharalyn Ink in Treatment: 8 Education Assessment Education Provided To: Patient and Caregiver Education Topics Provided Wound/Skin Impairment: Handouts: Other: wound care as ordered Methods: Demonstration, Explain/Verbal Responses: State content correctly Electronic Signature(s) Signed: 05/14/2018 4:47:13 PM By: Montey Hora Entered By: Montey Hora on 05/14/2018 12:00:59 [name], [name] ([id number]) -------------------------------------------------------------------------------- Wound Assessment Details Patient Name: [name]. Date of Service: 05/14/2018 11:00 AM Medical Record Number: [id number] Patient Account Number: [id number] Date of Birth/Sex: 1932/08/21 (85 y.o. F) Treating RN: Secundino Ginger Primary Care Danny Zimny:  Emily Filbert Other Clinician: Referring Jaki Hammerschmidt: Emily Filbert Treating Shifa Brisbon/Extender: Melburn Hake, HOYT Weeks in Treatment: 8 Wound Status Wound Number: 15 Primary Skin Tear Etiology: Wound Location: Left Lower Leg - Anterior, Distal Wound Open Wounding Event: Shear/Friction Status: Date Acquired: 03/05/2018 Comorbid Cataracts, Anemia, Arrhythmia, Hypertension, Weeks Of Treatment: 8 History: Osteoarthritis, Received Radiation Clustered Wound: No Photos Photo Uploaded By: Secundino Ginger on 05/14/2018 11:18:05 Wound Measurements Length: (cm) 3.2 Width: (cm) 3.5 Depth: (cm) 0.2 Area: (cm) 8.796 Volume: (cm) 1.759 % Reduction in Area: 40.4% % Reduction in Volume: 40.4% Epithelialization: None Tunneling: No Undermining: No Wound Description Full Thickness Without Exposed Support Classification: Structures Wound Margin: Flat and Intact Exudate Medium Amount: Exudate Type: Purulent Exudate Color: yellow, brown, green Foul Odor After Cleansing: No Slough/Fibrino Yes Wound Bed Granulation Amount: None Present (0%) Exposed Structure Necrotic Amount: Large (67-100%) Fascia Exposed: No Necrotic Quality: Adherent Slough Fat Layer (Subcutaneous Tissue) Exposed: Yes Tendon Exposed: No Muscle Exposed: No Joint Exposed: No Bone Exposed: No Bailey, Latoya A. ([id number]) Periwound Skin Texture Texture Color No Abnormalities Noted: No No Abnormalities Noted: No Callus: No Atrophie Blanche: No Crepitus: No Cyanosis: No Excoriation: No Ecchymosis: No Induration: No Erythema: Yes Rash: No Erythema Location: Circumferential Scarring: No Hemosiderin Staining: No Mottled: No Moisture Pallor: No No Abnormalities Noted: No Rubor: No Dry / Scaly: No Maceration: No Temperature / Pain Temperature: No Abnormality Tenderness on Palpation: Yes Wound Preparation Ulcer Cleansing: Rinsed/Irrigated with Saline Topical Anesthetic Applied: Other: lidocaine 4%, Treatment  Notes Wound #15 (Left, Distal, Anterior Lower Leg) Notes santyl and hydrafera blue to both wounds, ABD and conform secured with netting Electronic Signature(s) Signed: 05/14/2018 11:27:43 AM By: Secundino Ginger Entered By: Secundino Ginger on 05/14/2018 11:10:13 [name], [name] ([id number]) -------------------------------------------------------------------------------- Wound Assessment Details  Patient Name: [name], [name]. Date of Service: 05/14/2018 11:00 AM Medical Record Number: [id number] Patient Account Number: [id number] Date of Birth/Sex: June 17, 1932 (85 y.o. F) Treating RN: Secundino Ginger Primary Care Steele Stracener: Emily Filbert Other Clinician: Referring Raissa Dam: Emily Filbert Treating Charleston Vierling/Extender: Melburn Hake, HOYT Weeks in Treatment: 8 Wound Status Wound Number: 16 Primary Pressure Ulcer Etiology: Wound Location: Left Calcaneus Wound Open Wounding Event: Pressure Injury Status: Date Acquired: 03/11/2018 Comorbid Cataracts, Anemia, Arrhythmia, Hypertension, Weeks Of Treatment: 8 History: Osteoarthritis, Received Radiation Clustered Wound: No Photos Photo Uploaded By: Secundino Ginger on 05/14/2018 11:18:38 Wound Measurements Length: (cm) 2 Width: (cm) 2.5 Depth: (cm) 0.1 Area: (cm) 3.927 Volume: (cm) 0.393 % Reduction in Area: -733.8% % Reduction in Volume: -736.2% Epithelialization: Small (1-33%) Tunneling: No Undermining: No Wound Description Classification: Category/Stage III Foul Odor Wound Margin: Indistinct, nonvisible Slough/Fib Exudate Amount: Medium Exudate Type: Purulent Exudate Color: yellow, brown, green After Cleansing: No rino Yes Wound Bed Granulation Amount: Small (1-33%) Exposed Structure Granulation Quality: Pale Fascia Exposed: No Necrotic Amount: Large (67-100%) Fat Layer (Subcutaneous Tissue) Exposed: Yes Necrotic Quality: Eschar, Adherent Slough Tendon Exposed: No Muscle Exposed: No Joint Exposed: No Bone Exposed: No Periwound Skin Texture [name], Latoya A.  ([id number]) Texture Color No Abnormalities Noted: No No Abnormalities Noted: No Callus: No Atrophie Blanche: No Crepitus: No Cyanosis: No Excoriation: No Ecchymosis: No Induration: No Erythema: No Rash: No Hemosiderin Staining: No Scarring: No Mottled: No Pallor: No Moisture Rubor: No No Abnormalities Noted: No Dry / Scaly: No Temperature / Pain Maceration: No Temperature: No Abnormality Tenderness on Palpation: Yes Wound Preparation Ulcer Cleansing: Rinsed/Irrigated with Saline Topical Anesthetic Applied: Other: lidocaine 4%, Treatment Notes Wound #16 (Left Calcaneus) Notes santyl and hydrafera blue to both wounds, ABD and conform secured with netting Electronic Signature(s) Signed: 05/14/2018 11:27:43 AM By: Secundino Ginger Entered By: Secundino Ginger on 05/14/2018 11:11:39 [name], [name] ([id number]) -------------------------------------------------------------------------------- Vitals Details Patient Name: [name]. Date of Service: 05/14/2018 11:00 AM Medical Record Number: [id number] Patient Account Number: [id number] Date of Birth/Sex: [birth year] (85 y.o. F) Treating RN: Montey Hora Primary Care Aaliyah Cancro: Emily Filbert Other Clinician: Referring Duglas Heier: Emily Filbert Treating Eldor Conaway/Extender: Melburn Hake, HOYT Weeks in Treatment: 8 Vital Signs Time Taken: 10:59 Temperature (F): 97.9 Height (in): 68 Pulse (bpm): 67 Weight (lbs): 121 Respiratory Rate (breaths/min): 18 Body Mass Index (BMI): 18.4 Blood Pressure (mmHg): 148/40 Reference Range: 80 - 120 mg / dl Airway Electronic Signature(s) Signed: 05/14/2018 4:05:03 PM By: Lorine Bears RCP, RRT, CHT Entered By: Lorine Bears on 05/14/2018 11:01:37

## 2018-05-17 NOTE — Progress Notes (Signed)
SATORI, KRABILL ([id number]) Visit Report for 05/14/2018 Chief Complaint Document Details Patient Name: [name], [name]. Date of Service: 05/14/2018 11:00 AM Medical Record Number: [id number] Patient Account Number: [id number] Date of Birth/Sex: Oct 23, 1932 (85 y.o. F) Treating RN: Montey Hora Primary Care Provider: Emily Filbert Other Clinician: Referring Provider: Emily Filbert Treating Provider/Extender: Melburn Hake, Oluwaseun Bruyere Weeks in Treatment: 8 Information Obtained from: Patient Chief Complaint Left heel DTI and left LE ulcer Electronic Signature(s) Signed: 05/16/2018 1:01:02 AM By: Worthy Keeler PA-C Entered By: Worthy Keeler on 05/14/2018 11:06:01 Dunstan, [name] ([id number]) -------------------------------------------------------------------------------- Debridement Details Patient Name: [name]. Date of Service: 05/14/2018 11:00 AM Medical Record Number: [id number] Patient Account Number: [id number] Date of Birth/Sex: May 24, 1932 (85 y.o. F) Treating RN: Montey Hora Primary Care Provider: Emily Filbert Other Clinician: Referring Provider: Emily Filbert Treating Provider/Extender: Melburn Hake, Soraya Paquette Weeks in Treatment: 8 Debridement Performed for Wound #15 Left,Distal,Anterior Lower Leg Assessment: Performed By: Clinician Harold Barban, RN Debridement Type: Chemical/Enzymatic/Mechanical Agent Used: Santyl Level of Consciousness (Pre- Awake and Alert procedure): Pre-procedure Verification/Time Yes - 11:58 Out Taken: Start Time: 11:58 Pain Control: Lidocaine 4% Topical Solution Instrument: Other : tongue depressor Bleeding: Moderate End Time: 11:59 Procedural Pain: 0 Post Procedural Pain: 0 Response to Treatment: Procedure was tolerated well Level of Consciousness Awake and Alert (Post-procedure): Post Debridement Measurements of Total Wound Length: (cm) 3.2 Width: (cm) 3.5 Depth: (cm) 0.2 Volume: (cm) 1.759 Character of Wound/Ulcer Post Debridement: Improved Post  Procedure Diagnosis Same as Pre-procedure Electronic Signature(s) Signed: 05/14/2018 4:47:13 PM By: Montey Hora Signed: 05/16/2018 1:01:02 AM By: Worthy Keeler PA-C Entered By: Montey Hora on 05/14/2018 11:59:54 Clingerman, [name] ([id number]) -------------------------------------------------------------------------------- Debridement Details Patient Name: [name]. Date of Service: 05/14/2018 11:00 AM Medical Record Number: [id number] Patient Account Number: [id number] Date of Birth/Sex: 06-27-1932 (85 y.o. F) Treating RN: Montey Hora Primary Care Provider: Emily Filbert Other Clinician: Referring Provider: Emily Filbert Treating Provider/Extender: Melburn Hake, Stephonie Wilcoxen Weeks in Treatment: 8 Debridement Performed for Wound #16 Left Calcaneus Assessment: Performed By: Clinician Harold Barban, RN Debridement Type: Chemical/Enzymatic/Mechanical Agent Used: Santyl Level of Consciousness (Pre- Awake and Alert procedure): Pre-procedure Verification/Time Yes - 11:59 Out Taken: Start Time: 11:59 Pain Control: Lidocaine 4% Topical Solution Instrument: Other : tongue depressor Bleeding: Moderate End Time: 12:00 Procedural Pain: 0 Post Procedural Pain: 0 Response to Treatment: Procedure was tolerated well Level of Consciousness Awake and Alert (Post-procedure): Post Debridement Measurements of Total Wound Length: (cm) 2 Stage: Category/Stage III Width: (cm) 2.5 Depth: (cm) 0.1 Volume: (cm) 0.393 Character of Wound/Ulcer Post Improved Debridement: Post Procedure Diagnosis Same as Pre-procedure Electronic Signature(s) Signed: 05/14/2018 4:47:13 PM By: Montey Hora Signed: 05/16/2018 1:01:02 AM By: Worthy Keeler PA-C Entered By: Montey Hora on 05/14/2018 12:00:23 Esh, [name] ([id number]) -------------------------------------------------------------------------------- HPI Details Patient Name: [name]. Date of Service: 05/14/2018 11:00 AM Medical Record Number:  [id number] Patient Account Number: [id number] Date of Birth/Sex: 1933/02/18 (85 y.o. F) Treating RN: Montey Hora Primary Care Provider: Emily Filbert Other Clinician: Referring Provider: Emily Filbert Treating Provider/Extender: Melburn Hake, Thatiana Renbarger Weeks in Treatment: 8 History of Present Illness HPI Description: 85 year old patient who is known to the wound center for several years now has a new injury to the right lower extremity where she bumped herself against the bath tub. He also has had a chronic right lateral malleolus open wound which has been coming on and off for several years now. This has been there for at least 2014 and was healed  out in November 2016. In the past she has been seen by her cardiologist Dr. Kathlyn Sacramento in April 2016. Noninvasive vascular evaluation showed an ABI of 0.68 on the right and 0.85 on the left. 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 One-vessel runoff below the knee via the peroneal artery with short occlusion of the anterior tibial artery and reconstitution. No intervention performed. Most recently she was seen for a wound on her right lower extremity which was healed out by March 15 She is not a smoker and has had no diabetes mellitus. 07/29/2015 -- the appointment with her cardiologist Dr. Fletcher Anon is still pending for review of her arterial duplex studies. Readmission: 03/19/18 on evaluation today patient presents for readmission concerning an issue she has been having with her left heel and left anterior lower extremity. She has a history of atrial fibrillation and had knee surgery for a total knee replacement three weeks ago today. Subsequently since being discharged from the hospital it was noted that she did have a left heel deep tissue injury as well the left anterior lower extremity ulcer. This has been present apparently for roughly 2 weeks. She does not appear to have any  evidence of infection which is good news. She has been tolerating the dressing changes that have been performed it sounds as if Santyl is what has been used at least most recently. Obviously I think this is likely a good treatment choice for her. Especially in light of the fact that the wound on the distal lower extremity of the left leg appears to show signs of significant slough/eschar buildup which is starting to loosen up now that she is using the central but prior was fairly significant as far as how hard and black it was. She does have pain at both sites the open wound more than the deep tissue injury. Otherwise the patient does have a history of peripheral vascular disease, congestive heart failure, chronic kidney disease stage IV, and now the presence of a left artificial knee joint. 03/28/18 on evaluation today patient appears to be doing a little worse in regard to the dorsal surface of her lower extremity. She has been tolerating the dressing changes without complication although she states there's a lot of pain at this location. Subsequently in regard to her left heel the show signs of continued to drain with maceration noted. I think that some of the dead tissue needs to be removed from the surface of the wound at the site to allow it to heal appropriately. 04/02/18 on evaluation today patient's wounds actually appear to be doing better at this point in regard to the dorsal wound on the anterior lower leg as well as the heel ulcer. She does have an area of thicker eschar of the heel which I think actually needs to be loosened up I think cental or Iodoflex could be beneficial in this regard. Otherwise debridement will likely be undertaken today over the lower leg region in order to clear away some of the necrotic material. 04/09/18 on evaluation today a lot of the skin which is around the patient's heel ulcer actually is lifting off which is good news. There is some Slough noted on the  surface of one centrally this is going to require sharp debridement. The dorsal surface of her foot wound actually appears to be doing much better which is great news. Overall I'm fairly pleased with how things seem to be progressing. The  patient likewise is worried about her heel but otherwise feels like things are going fairly well. 04/23/18 on evaluation today patient appears to be doing fairly well in regard to her ulcer. She has been tolerating the dressing changes with the Santyl and overall I do believe this has been of benefit. With that being said she is still having a lot of issues with pain on the dorsal aspect of her foot although I do believe she's slowly making progress which is good news. No fevers, chills, nausea, or vomiting noted at this time. JAZZMYN, FILION ([id number]) 04/30/18 on evaluation today patient unfortunately appears to be doing a little bit worse I'm concerned about cellulitis around her wounds. Fortunately there does not appear to be any evidence of infection at this time which is good news. That is systemically. Unfortunately she does have increased pain. 05/07/18 on evaluation today patient appears to be doing decently well in regard to her wounds at this point. With that being said she tells me that she had a fall where she actually struck her head on Saturday roughly 72 hours ago. With that being said she doesn't note any dizziness or confusion but has had a headache. She has continued to take the Keflex which was prescribed for her. She is on Eliquis which obviously is somewhat of a concern. The other issue is that upon evaluation today she seemed to have a very interesting issue going on with her blood pressure we were able to obtain a systolic blood pressure manually though the diastolic pressure was around 32 with the machine and when I took this manually I could hear the diastolic sounds even when the cough was completely deflated and in fact even when it was removed  from her arm. This was a phenomenon that I had not seen previous. For this reason I was concerned enough especially with a history of head trauma to contact her cardiologist, Dr. Sophronia Simas, however he is out of the office at this point due to his wife having had a baby. Nonetheless I did end up speaking with the cardiologist on call and his recommendation was more or less an echo of what I was concerned about which was the patient should likely go to the hospital to have things checked out especially with her history of anticoagulant therapy. 05/14/18 on evaluation today patient's wounds both in regard to the heel and her lower leg appear to be doing better especially in overall appearance. Fortunately there does not appear to be any evidence of infection at this time although unfortunately she tells me she's been having some discomfort over the past week with the dressing changes. We have been using Iodoflex. Overall I do think it's helped improve the quality of the wound although I'm not sure if her discomfort is going to be a limiting factor here. Electronic Signature(s) Signed: 05/16/2018 1:01:02 AM By: Worthy Keeler PA-C Entered By: Worthy Keeler on 05/16/2018 00:51:59 Doepke, [name] ([id number]) -------------------------------------------------------------------------------- Physical Exam Details Patient Name: [name]. Date of Service: 05/14/2018 11:00 AM Medical Record Number: [id number] Patient Account Number: [id number] Date of Birth/Sex: [birth year] (85 y.o. F) Treating RN: Montey Hora Primary Care Provider: Emily Filbert Other Clinician: Referring Provider: Emily Filbert Treating Provider/Extender: Melburn Hake, Hilmar Moldovan Weeks in Treatment: 8 Constitutional Well-nourished and well-hydrated in no acute distress. Respiratory normal breathing without difficulty. clear to auscultation bilaterally. Cardiovascular regular rate and rhythm with normal S1, S2. Psychiatric this patient is able  to make decisions and  demonstrates good insight into disease process. Alert and Oriented x 3. pleasant and cooperative. Notes Patient's wound bed currently did have some Slough on the surface of the wound I was able to pretty much gently clean this away with saline and gauze today. No sharp debridement was performed. Overall she does have additional granulation and seems to be making good progress in my pinion. Electronic Signature(s) Signed: 05/16/2018 1:01:02 AM By: Worthy Keeler PA-C Entered By: Worthy Keeler on 05/16/2018 00:52:29 Metheny, [name] ([id number]) -------------------------------------------------------------------------------- Physician Orders Details Patient Name: [name] Date of Service: 05/14/2018 11:00 AM Medical Record Number: [id number] Patient Account Number: [id number] Date of Birth/Sex: 1932-06-20 (85 y.o. F) Treating RN: Montey Hora Primary Care Provider: Emily Filbert Other Clinician: Referring Provider: Emily Filbert Treating Provider/Extender: Melburn Hake, Zyiere Rosemond Weeks in Treatment: 8 Verbal / Phone Orders: No Diagnosis Coding ICD-10 Coding Code Description S81.802A Unspecified open wound, left lower leg, initial encounter L97.822 Non-pressure chronic ulcer of other part of left lower leg with fat layer exposed L89.626 Pressure-induced deep tissue damage of left heel I73.9 Peripheral vascular disease, unspecified I50.42 Chronic combined systolic (congestive) and diastolic (congestive) heart failure N18.4 Chronic kidney disease, stage 4 (severe) Y07.371 Presence of left artificial knee joint Wound Cleansing Wound #15 Left,Distal,Anterior Lower Leg o Cleanse wound with mild soap and water Wound #16 Left Calcaneus o Cleanse wound with mild soap and water Primary Wound Dressing Wound #15 Left,Distal,Anterior Lower Leg o Hydrafera Blue Ready Transfer Wound #16 Left Calcaneus o Hydrafera Blue Ready Transfer Secondary Dressing Wound #15  Left,Distal,Anterior Lower Leg o Gauze, ABD and Kerlix/Conform - secure with netting Wound #16 Left Calcaneus o Gauze, ABD and Kerlix/Conform - secure with netting Dressing Change Frequency Wound #15 Left,Distal,Anterior Lower Leg o Change dressing every other day. Wound #16 Left Calcaneus o Change dressing every other day. Follow-up Appointments Wound #15 Left,Distal,Anterior Lower Leg o Return Appointment in 1 week. MELICIA, ESQUEDA ([id number]) Wound #16 Left Calcaneus o Return Appointment in 1 week. Off-Loading Wound #16 Left Calcaneus o Other: - Please continue wearing your boot to protect your heel Electronic Signature(s) Signed: 05/14/2018 4:47:13 PM By: Montey Hora Signed: 05/16/2018 1:01:02 AM By: Worthy Keeler PA-C Entered By: Montey Hora on 05/14/2018 11:54:54 Stroud, [name] ([id number]) -------------------------------------------------------------------------------- Problem List Details Patient Name: [name]. Date of Service: 05/14/2018 11:00 AM Medical Record Number: [id number] Patient Account Number: [id number] Date of Birth/Sex: Jan 24, 1933 (85 y.o. F) Treating RN: Montey Hora Primary Care Provider: Emily Filbert Other Clinician: Referring Provider: Emily Filbert Treating Provider/Extender: Melburn Hake, Khaleah Duer Weeks in Treatment: 8 Active Problems ICD-10 Evaluated Encounter Code Description Active Date Today Diagnosis S81.802A Unspecified open wound, left lower leg, initial encounter 03/19/2018 No Yes L97.822 Non-pressure chronic ulcer of other part of left lower leg with 03/19/2018 No Yes fat layer exposed L89.626 Pressure-induced deep tissue damage of left heel 03/19/2018 No Yes I73.9 Peripheral vascular disease, unspecified 03/19/2018 No Yes I50.42 Chronic combined systolic (congestive) and diastolic 16/12/6787 No Yes (congestive) heart failure N18.4 Chronic kidney disease, stage 4 (severe) 03/19/2018 No Yes Z96.652 Presence of left artificial knee  joint 03/19/2018 No Yes Inactive Problems Resolved Problems Electronic Signature(s) Signed: 05/16/2018 1:01:02 AM By: Worthy Keeler PA-C Entered By: Worthy Keeler on 05/14/2018 11:05:31 Avellino, [name] ([id number]) -------------------------------------------------------------------------------- Progress Note/History and Physical Details Patient Name: [name]. Date of Service: 05/14/2018 11:00 AM Medical Record Number: [id number] Patient Account Number: [id number] Date of Birth/Sex: [birth year] (85 y.o. F) Treating RN: Marjory Lies,  Di Kindle Primary Care Provider: Emily Filbert Other Clinician: Referring Provider: Emily Filbert Treating Provider/Extender: Melburn Hake, Bralee Feldt Weeks in Treatment: 8 Subjective Chief Complaint Information obtained from Patient Left heel DTI and left LE ulcer History of Present Illness (HPI) 85 year old patient who is known to the wound center for several years now has a new injury to the right lower extremity where she bumped herself against the bath tub. He also has had a chronic right lateral malleolus open wound which has been coming on and off for several years now. This has been there for at least 2014 and was healed out in November 2016. In the past she has been seen by her cardiologist Dr. Kathlyn Sacramento in April 2016. Noninvasive vascular evaluation showed an ABI of 0.68 on the right and 0.85 on the left. 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 One-vessel runoff below the knee via the peroneal artery with short occlusion of the anterior tibial artery and reconstitution. No intervention performed. Most recently she was seen for a wound on her right lower extremity which was healed out by March 15 She is not a smoker and has had no diabetes mellitus. 07/29/2015 -- the appointment with her cardiologist Dr. Fletcher Anon is still pending for review of her arterial duplex studies. Readmission: 03/19/18  on evaluation today patient presents for readmission concerning an issue she has been having with her left heel and left anterior lower extremity. She has a history of atrial fibrillation and had knee surgery for a total knee replacement three weeks ago today. Subsequently since being discharged from the hospital it was noted that she did have a left heel deep tissue injury as well the left anterior lower extremity ulcer. This has been present apparently for roughly 2 weeks. She does not appear to have any evidence of infection which is good news. She has been tolerating the dressing changes that have been performed it sounds as if Santyl is what has been used at least most recently. Obviously I think this is likely a good treatment choice for her. Especially in light of the fact that the wound on the distal lower extremity of the left leg appears to show signs of significant slough/eschar buildup which is starting to loosen up now that she is using the central but prior was fairly significant as far as how hard and black it was. She does have pain at both sites the open wound more than the deep tissue injury. Otherwise the patient does have a history of peripheral vascular disease, congestive heart failure, chronic kidney disease stage IV, and now the presence of a left artificial knee joint. 03/28/18 on evaluation today patient appears to be doing a little worse in regard to the dorsal surface of her lower extremity. She has been tolerating the dressing changes without complication although she states there's a lot of pain at this location. Subsequently in regard to her left heel the show signs of continued to drain with maceration noted. I think that some of the dead tissue needs to be removed from the surface of the wound at the site to allow it to heal appropriately. 04/02/18 on evaluation today patient's wounds actually appear to be doing better at this point in regard to the dorsal wound on  the anterior lower leg as well as the heel ulcer. She does have an area of thicker eschar of the heel which I think actually needs to be loosened up I think  cental or Iodoflex could be beneficial in this regard. Otherwise debridement will likely be undertaken today over the lower leg region in order to clear away some of the necrotic material. 04/09/18 on evaluation today a lot of the skin which is around the patient's heel ulcer actually is lifting off which is good news. There is some Slough noted on the surface of one centrally this is going to require sharp debridement. The dorsal surface of her foot wound actually appears to be doing much better which is great news. Overall I'm fairly pleased with how things seem to be progressing. The patient likewise is worried about her heel but otherwise feels like things are going fairly well. ARAMIS, ZOBEL ([id number]) 04/23/18 on evaluation today patient appears to be doing fairly well in regard to her ulcer. She has been tolerating the dressing changes with the Santyl and overall I do believe this has been of benefit. With that being said she is still having a lot of issues with pain on the dorsal aspect of her foot although I do believe she's slowly making progress which is good news. No fevers, chills, nausea, or vomiting noted at this time. 04/30/18 on evaluation today patient unfortunately appears to be doing a little bit worse I'm concerned about cellulitis around her wounds. Fortunately there does not appear to be any evidence of infection at this time which is good news. That is systemically. Unfortunately she does have increased pain. 05/07/18 on evaluation today patient appears to be doing decently well in regard to her wounds at this point. With that being said she tells me that she had a fall where she actually struck her head on Saturday roughly 72 hours ago. With that being said she doesn't note any dizziness or confusion but has had a headache.  She has continued to take the Keflex which was prescribed for her. She is on Eliquis which obviously is somewhat of a concern. The other issue is that upon evaluation today she seemed to have a very interesting issue going on with her blood pressure we were able to obtain a systolic blood pressure manually though the diastolic pressure was around 32 with the machine and when I took this manually I could hear the diastolic sounds even when the cough was completely deflated and in fact even when it was removed from her arm. This was a phenomenon that I had not seen previous. For this reason I was concerned enough especially with a history of head trauma to contact her cardiologist, Dr. Sophronia Simas, however he is out of the office at this point due to his wife having had a baby. Nonetheless I did end up speaking with the cardiologist on call and his recommendation was more or less an echo of what I was concerned about which was the patient should likely go to the hospital to have things checked out especially with her history of anticoagulant therapy. 05/14/18 on evaluation today patient's wounds both in regard to the heel and her lower leg appear to be doing better especially in overall appearance. Fortunately there does not appear to be any evidence of infection at this time although unfortunately she tells me she's been having some discomfort over the past week with the dressing changes. We have been using Iodoflex. Overall I do think it's helped improve the quality of the wound although I'm not sure if her discomfort is going to be a limiting factor here. Wound History Patient presents with 1 open wound that has  been present for approximately 2 weeks. Laboratory tests have been performed in the last month. Patient reportedly has tested positive for an antibiotic resistant organism. Patient reportedly has not tested positive for osteomyelitis. Patient reportedly has not had testing performed to evaluate  circulation in the legs. Patient History Information obtained from Patient. Family History Cancer - Siblings, Heart Disease - Father, Hypertension - Siblings, Stroke - Mother, No family history of Hereditary Spherocytosis, Kidney Disease, Lung Disease, Seizures, Thyroid Problems, Tuberculosis. Social History Never smoker, Marital Status - Married, Alcohol Use - Never, Drug Use - No History, Caffeine Use - Daily. Medical History Eyes Patient has history of Cataracts Denies history of Glaucoma, Optic Neuritis Ear/Nose/Mouth/Throat Denies history of Chronic sinus problems/congestion, Middle ear problems Hematologic/Lymphatic Patient has history of Anemia Denies history of Hemophilia, Human Immunodeficiency Virus, Lymphedema, Sickle Cell Disease Respiratory Denies history of Aspiration, Asthma, Chronic Obstructive Pulmonary Disease (COPD), Pneumothorax, Sleep Apnea, Tuberculosis Cardiovascular Patient has history of Arrhythmia - A fib, Hypertension Denies history of Angina, Congestive Heart Failure, Coronary Artery Disease, Hypotension, Myocardial Infarction, Peripheral Arterial Disease, Peripheral Venous Disease, Phlebitis, Vasculitis Hoehn, Damariz A. ([id number]) Gastrointestinal Denies history of Cirrhosis , Colitis, Crohn s, Hepatitis A, Hepatitis B, Hepatitis C Endocrine Denies history of Type I Diabetes, Type II Diabetes Genitourinary Denies history of End Stage Renal Disease Immunological Denies history of Lupus Erythematosus, Raynaud s, Scleroderma Integumentary (Skin) Denies history of History of Burn, History of pressure wounds Musculoskeletal Patient has history of Osteoarthritis Denies history of Gout, Rheumatoid Arthritis Neurologic Denies history of Dementia, Neuropathy, Quadriplegia, Paraplegia, Seizure Disorder Oncologic Patient has history of Received Radiation Denies history of Received Chemotherapy Psychiatric Denies history of Anorexia/bulimia, Confinement  Anxiety Hospitalization/Surgery History - 09/16/2014, [facility], (R) knee replacement. Medical And Surgical History Notes Ear/Nose/Mouth/Throat HOH- hearing aids Genitourinary frequent UTIs Oncologic Breast Ca- R mastectomy Review of Systems (ROS) Constitutional Symptoms (General Health) Denies complaints or symptoms of Fever, Chills. Respiratory The patient has no complaints or symptoms. Cardiovascular The patient has no complaints or symptoms. Psychiatric The patient has no complaints or symptoms. Objective Constitutional Well-nourished and well-hydrated in no acute distress. Vitals Time Taken: 10:59 AM, Height: 68 in, Weight: 121 lbs, BMI: 18.4, Temperature: 97.9 F, Pulse: 67 bpm, Respiratory Rate: 18 breaths/min, Blood Pressure: 148/40 mmHg. Respiratory Werk, Adysen A. ([id number]) normal breathing without difficulty. clear to auscultation bilaterally. Cardiovascular regular rate and rhythm with normal S1, S2. Psychiatric this patient is able to make decisions and demonstrates good insight into disease process. Alert and Oriented x 3. pleasant and cooperative. General Notes: Patient's wound bed currently did have some Slough on the surface of the wound I was able to pretty much gently clean this away with saline and gauze today. No sharp debridement was performed. Overall she does have additional granulation and seems to be making good progress in my pinion. Integumentary (Hair, Skin) Wound #15 status is Open. Original cause of wound was Shear/Friction. The wound is located on the [facility] Lower Leg. The wound measures 3.2cm length x 3.5cm width x 0.2cm depth; 8.796cm^2 area and 1.759cm^3 volume. There is Fat Layer (Subcutaneous Tissue) Exposed exposed. There is no tunneling or undermining noted. There is a medium amount of purulent drainage noted. The wound margin is flat and intact. There is no granulation within the wound bed. There is a large (67-100%) amount of  necrotic tissue within the wound bed including Adherent Slough. The periwound skin appearance exhibited: Erythema. The periwound skin appearance did not exhibit: Callus, Crepitus, Excoriation, Induration, Rash,  Scarring, Dry/Scaly, Maceration, Atrophie Blanche, Cyanosis, Ecchymosis, Hemosiderin Staining, Mottled, Pallor, Rubor. The surrounding wound skin color is noted with erythema which is circumferential. Periwound temperature was noted as No Abnormality. The periwound has tenderness on palpation. Wound #16 status is Open. Original cause of wound was Pressure Injury. The wound is located on the Left Calcaneus. The wound measures 2cm length x 2.5cm width x 0.1cm depth; 3.927cm^2 area and 0.393cm^3 volume. There is Fat Layer (Subcutaneous Tissue) Exposed exposed. There is no tunneling or undermining noted. There is a medium amount of purulent drainage noted. The wound margin is indistinct and nonvisible. There is small (1-33%) pale granulation within the wound bed. There is a large (67-100%) amount of necrotic tissue within the wound bed including Eschar and Adherent Slough. The periwound skin appearance did not exhibit: Callus, Crepitus, Excoriation, Induration, Rash, Scarring, Dry/Scaly, Maceration, Atrophie Blanche, Cyanosis, Ecchymosis, Hemosiderin Staining, Mottled, Pallor, Rubor, Erythema. Periwound temperature was noted as No Abnormality. The periwound has tenderness on palpation. Assessment Active Problems ICD-10 Unspecified open wound, left lower leg, initial encounter Non-pressure chronic ulcer of other part of left lower leg with fat layer exposed Pressure-induced deep tissue damage of left heel Peripheral vascular disease, unspecified Chronic combined systolic (congestive) and diastolic (congestive) heart failure Chronic kidney disease, stage 4 (severe) Presence of left artificial knee joint Procedures Wound #15 Limas, Neal A. ([id number]) Pre-procedure diagnosis of Wound #15 is a  Skin Tear located on the Left,Distal,Anterior Lower Leg . There was a Chemical/Enzymatic/Mechanical debridement performed by Harold Barban, RN. With the following instrument(s): tongue depressor after achieving pain control using Lidocaine 4% Topical Solution. Agent used was [employer]. A time out was conducted at 11:58, prior to the start of the procedure. A Moderate amount of bleeding was controlled with N/A. The procedure was tolerated well with a pain level of 0 throughout and a pain level of 0 following the procedure. Post Debridement Measurements: 3.2cm length x 3.5cm width x 0.2cm depth; 1.759cm^3 volume. Character of Wound/Ulcer Post Debridement is improved. Post procedure Diagnosis Wound #15: Same as Pre-Procedure Wound #16 Pre-procedure diagnosis of Wound #16 is a Pressure Ulcer located on the Left Calcaneus . There was a Chemical/Enzymatic/Mechanical debridement performed by Harold Barban, RN. With the following instrument(s): tongue depressor after achieving pain control using Lidocaine 4% Topical Solution. Agent used was [employer]. A time out was conducted at 11:59, prior to the start of the procedure. A Moderate amount of bleeding was controlled with N/A. The procedure was tolerated well with a pain level of 0 throughout and a pain level of 0 following the procedure. Post Debridement Measurements: 2cm length x 2.5cm width x 0.1cm depth; 0.393cm^3 volume. Post debridement Stage noted as Category/Stage III. Character of Wound/Ulcer Post Debridement is improved. Post procedure Diagnosis Wound #16: Same as Pre-Procedure Plan Wound Cleansing: Wound #15 Left,Distal,Anterior Lower Leg: Cleanse wound with mild soap and water Wound #16 Left Calcaneus: Cleanse wound with mild soap and water Primary Wound Dressing: Wound #15 Left,Distal,Anterior Lower Leg: Hydrafera Blue Ready Transfer Wound #16 Left Calcaneus: Hydrafera Blue Ready Transfer Secondary Dressing: Wound #15  Left,Distal,Anterior Lower Leg: Gauze, ABD and Kerlix/Conform - secure with netting Wound #16 Left Calcaneus: Gauze, ABD and Kerlix/Conform - secure with netting Dressing Change Frequency: Wound #15 Left,Distal,Anterior Lower Leg: Change dressing every other day. Wound #16 Left Calcaneus: Change dressing every other day. Follow-up Appointments: Wound #15 Left,Distal,Anterior Lower Leg: Return Appointment in 1 week. Wound #16 Left Calcaneus: Return Appointment in 1 week. Off-Loading: Wound #16 Left Calcaneus:  Other: - Please continue wearing your boot to protect your heel Victorino, JAYLYNE BREESE ([id number]) My suggestion at this point is gonna be that we go ahead and initiate the above wound care measures for the next week she's in agreement with plan. If anything changes or worsens she will let me know. My hope is that the [facility] Dressing not hurt her and that we can still use a little bit of the Santyl underneath this to help with additional debridement for now although the wound for the most part is appearing much cleaner at both locations. We will see were things stand at follow-up. Please see above for specific wound care orders. We will see patient for re-evaluation in 1 week(s) here in the clinic. If anything worsens or changes patient will contact our office for additional recommendations. Electronic Signature(s) Signed: 05/16/2018 1:01:02 AM By: Worthy Keeler PA-C Entered By: Worthy Keeler on 05/16/2018 00:52:40 Presley, [name] ([id number]) -------------------------------------------------------------------------------- ROS/PFSH Details Patient Name: [name] Date of Service: 05/14/2018 11:00 AM Medical Record Number: [id number] Patient Account Number: [id number] Date of Birth/Sex: Jun 17, 1932 (85 y.o. F) Treating RN: Montey Hora Primary Care Provider: Emily Filbert Other Clinician: Referring Provider: Emily Filbert Treating Provider/Extender: Melburn Hake, Lemon Sternberg Weeks in  Treatment: 8 Label Progress Note Print Version as History and Physical for this encounter Information Obtained From Patient Wound History Do you currently have one or more open woundso Yes How many open wounds do you currently haveo 1 Approximately how long have you had your woundso 2 weeks Have you had any lab work done in the past montho Yes Have you tested positive for osteomyelitis (bone infection)o No Have you had any tests for circulation on your legso No Constitutional Symptoms (General Health) Complaints and Symptoms: Negative for: Fever; Chills Eyes Medical History: Positive for: Cataracts Negative for: Glaucoma; Optic Neuritis Ear/Nose/Mouth/Throat Medical History: Negative for: Chronic sinus problems/congestion; Middle ear problems Past Medical History Notes: HOH- hearing aids Hematologic/Lymphatic Medical History: Positive for: Anemia Negative for: Hemophilia; Human Immunodeficiency Virus; Lymphedema; Sickle Cell Disease Respiratory Complaints and Symptoms: No Complaints or Symptoms Medical History: Negative for: Aspiration; Asthma; Chronic Obstructive Pulmonary Disease (COPD); Pneumothorax; Sleep Apnea; Tuberculosis Cardiovascular Complaints and Symptoms: No Complaints or Symptoms Dittmer, MAGDELINE PRANGE. ([id number]) Medical History: Positive for: Arrhythmia - A fib; Hypertension Negative for: Angina; Congestive Heart Failure; Coronary Artery Disease; Hypotension; Myocardial Infarction; Peripheral Arterial Disease; Peripheral Venous Disease; Phlebitis; Vasculitis Gastrointestinal Medical History: Negative for: Cirrhosis ; Colitis; Crohnos; Hepatitis A; Hepatitis B; Hepatitis C Endocrine Medical History: Negative for: Type I Diabetes; Type II Diabetes Genitourinary Medical History: Negative for: End Stage Renal Disease Past Medical History Notes: frequent UTIs Immunological Medical History: Negative for: Lupus Erythematosus; Raynaudos; Scleroderma Integumentary  (Skin) Medical History: Negative for: History of Burn; History of pressure wounds Musculoskeletal Medical History: Positive for: Osteoarthritis Negative for: Gout; Rheumatoid Arthritis Neurologic Medical History: Negative for: Dementia; Neuropathy; Quadriplegia; Paraplegia; Seizure Disorder Oncologic Medical History: Positive for: Received Radiation Negative for: Received Chemotherapy Past Medical History Notes: Breast Ca- R mastectomy Psychiatric Complaints and Symptoms: No Complaints or Symptoms Medical History: Negative for: Anorexia/bulimia; Confinement Anxiety Trudel, Derra A. ([id number]) HBO Extended History Items Eyes: Cataracts Immunizations Pneumococcal Vaccine: Received Pneumococcal Vaccination: No Tetanus Vaccine: Last tetanus shot: 08/15/2008 Implantable Devices Hospitalization / Surgery History Name of Hospital Purpose of Hospitalization/Sugery Date [facility] (R) knee replacement 09/16/2014 Family and Social History Cancer: Yes - Siblings; Heart Disease: Yes - Father; Hereditary Spherocytosis: No; Hypertension: Yes - Siblings; Kidney Disease:  No; Lung Disease: No; Seizures: No; Stroke: Yes - Mother; Thyroid Problems: No; Tuberculosis: No; Never smoker; Marital Status - Married; Alcohol Use: Never; Drug Use: No History; Caffeine Use: Daily; Financial Concerns: No; Food, Clothing or Shelter Needs: No; Support System Lacking: No; Transportation Concerns: No; Advanced Directives: Yes (Not Provided); Patient does not want information on Advanced Directives; Do not resuscitate: No; Living Will: Yes (Not Provided); Medical Power of Attorney: Yes (Not Provided) Physician Affirmation I have reviewed and agree with the above information. Electronic Signature(s) Signed: 05/16/2018 1:01:02 AM By: Worthy Keeler PA-C Signed: 05/16/2018 5:03:44 PM By: Montey Hora Entered By: Worthy Keeler on 05/16/2018 00:52:13 Goswick, [name]  ([id number]) -------------------------------------------------------------------------------- SuperBill Details Patient Name: [name]. Date of Service: 05/14/2018 Medical Record Number: [id number] Patient Account Number: [id number] Date of Birth/Sex: 1932/05/04 (85 y.o. F) Treating RN: Montey Hora Primary Care Provider: Emily Filbert Other Clinician: Referring Provider: Emily Filbert Treating Provider/Extender: Melburn Hake, Amay Mijangos Weeks in Treatment: 8 Diagnosis Coding ICD-10 Codes Code Description [phone] Unspecified open wound, left lower leg, initial encounter L97.822 Non-pressure chronic ulcer of other part of left lower leg with fat layer exposed L89.626 Pressure-induced deep tissue damage of left heel I73.9 Peripheral vascular disease, unspecified I50.42 Chronic combined systolic (congestive) and diastolic (congestive) heart failure N18.4 Chronic kidney disease, stage 4 (severe) V89.381 Presence of left artificial knee joint Facility Procedures CPT4 Code: 01751025 Description: 85277 - DEBRIDE W/O ANES NON SELECT Modifier: Quantity: 1 CPT4 Code: 82423536 Description: 14431 - DEBRIDE W/O ANES NON SELECT Modifier: Quantity: 1 Physician Procedures CPT4 Code Description: [id number] 61950 - WC PHYS LEVEL 4 - EST PT ICD-10 Diagnosis Description S81.802A Unspecified open wound, left lower leg, initial encounter L97.822 Non-pressure chronic ulcer of other part of left lower leg wit L89.626  Pressure-induced deep tissue damage of left heel I73.9 Peripheral vascular disease, unspecified Modifier: h fat layer expos Quantity: 1 ed Electronic Signature(s) Signed: 05/16/2018 1:01:02 AM By: Worthy Keeler PA-C Entered By: Worthy Keeler on 05/14/2018 23:57:13

## 2018-05-19 ENCOUNTER — Other Ambulatory Visit: Payer: Self-pay | Admitting: Adult Health

## 2018-05-21 ENCOUNTER — Ambulatory Visit: Payer: Medicare HMO | Admitting: Physician Assistant

## 2018-05-23 ENCOUNTER — Encounter: Payer: Medicare HMO | Attending: Family Medicine | Admitting: Family Medicine

## 2018-05-23 DIAGNOSIS — L97822 Non-pressure chronic ulcer of other part of left lower leg with fat layer exposed: Secondary | ICD-10-CM | POA: Diagnosis not present

## 2018-05-23 DIAGNOSIS — L89626 Pressure-induced deep tissue damage of left heel: Secondary | ICD-10-CM | POA: Diagnosis not present

## 2018-05-23 DIAGNOSIS — Z853 Personal history of malignant neoplasm of breast: Secondary | ICD-10-CM | POA: Diagnosis not present

## 2018-05-23 DIAGNOSIS — Z96652 Presence of left artificial knee joint: Secondary | ICD-10-CM | POA: Insufficient documentation

## 2018-05-23 DIAGNOSIS — N184 Chronic kidney disease, stage 4 (severe): Secondary | ICD-10-CM | POA: Diagnosis not present

## 2018-05-23 DIAGNOSIS — L84 Corns and callosities: Secondary | ICD-10-CM | POA: Diagnosis not present

## 2018-05-23 DIAGNOSIS — I5042 Chronic combined systolic (congestive) and diastolic (congestive) heart failure: Secondary | ICD-10-CM | POA: Insufficient documentation

## 2018-05-23 DIAGNOSIS — Z923 Personal history of irradiation: Secondary | ICD-10-CM | POA: Insufficient documentation

## 2018-05-23 DIAGNOSIS — I130 Hypertensive heart and chronic kidney disease with heart failure and stage 1 through stage 4 chronic kidney disease, or unspecified chronic kidney disease: Secondary | ICD-10-CM | POA: Insufficient documentation

## 2018-05-23 DIAGNOSIS — I739 Peripheral vascular disease, unspecified: Secondary | ICD-10-CM | POA: Diagnosis not present

## 2018-05-30 ENCOUNTER — Other Ambulatory Visit: Payer: Self-pay | Admitting: Student

## 2018-05-30 DIAGNOSIS — S32030A Wedge compression fracture of third lumbar vertebra, initial encounter for closed fracture: Secondary | ICD-10-CM

## 2018-05-30 DIAGNOSIS — M47816 Spondylosis without myelopathy or radiculopathy, lumbar region: Secondary | ICD-10-CM

## 2018-06-01 ENCOUNTER — Other Ambulatory Visit: Payer: Self-pay | Admitting: Adult Health

## 2018-06-03 ENCOUNTER — Other Ambulatory Visit: Payer: Self-pay | Admitting: Adult Health

## 2018-06-06 ENCOUNTER — Ambulatory Visit: Payer: Medicare HMO | Admitting: Physician Assistant

## 2018-06-09 ENCOUNTER — Ambulatory Visit
Admission: RE | Admit: 2018-06-09 | Discharge: 2018-06-09 | Disposition: A | Discharge status: Home / Self Care | Payer: Medicare HMO | Source: Ambulatory Visit | Attending: Student | Admitting: Student

## 2018-06-09 DIAGNOSIS — M47816 Spondylosis without myelopathy or radiculopathy, lumbar region: Secondary | ICD-10-CM | POA: Diagnosis present

## 2018-06-09 DIAGNOSIS — S32030A Wedge compression fracture of third lumbar vertebra, initial encounter for closed fracture: Secondary | ICD-10-CM | POA: Diagnosis not present

## 2018-06-13 ENCOUNTER — Ambulatory Visit: Payer: Medicare HMO | Admitting: Physician Assistant

## 2018-06-17 ENCOUNTER — Other Ambulatory Visit: Payer: Self-pay

## 2018-06-17 ENCOUNTER — Encounter
Admission: RE | Admit: 2018-06-17 | Discharge: 2018-06-17 | Disposition: A | Discharge status: Home / Self Care | Payer: Medicare HMO | Source: Ambulatory Visit | Attending: Orthopedic Surgery | Admitting: Orthopedic Surgery

## 2018-06-17 DIAGNOSIS — R9431 Abnormal electrocardiogram [ECG] [EKG]: Secondary | ICD-10-CM | POA: Insufficient documentation

## 2018-06-17 DIAGNOSIS — I4891 Unspecified atrial fibrillation: Secondary | ICD-10-CM | POA: Diagnosis not present

## 2018-06-17 DIAGNOSIS — I10 Essential (primary) hypertension: Secondary | ICD-10-CM | POA: Diagnosis not present

## 2018-06-17 DIAGNOSIS — Z01818 Encounter for other preprocedural examination: Secondary | ICD-10-CM | POA: Diagnosis not present

## 2018-06-17 HISTORY — DX: Other specified postprocedural states: R11.2

## 2018-06-17 HISTORY — DX: Nausea with vomiting, unspecified: Z98.890

## 2018-06-17 HISTORY — DX: Acute embolism and thrombosis of unspecified deep veins of unspecified lower extremity: I82.409

## 2018-06-17 LAB — CBC
HCT: 37.7 % (ref 36.0–46.0)
Hemoglobin: 11.6 g/dL — ABNORMAL LOW (ref 12.0–15.0)
MCH: 32.6 pg (ref 26.0–34.0)
MCHC: 30.8 g/dL (ref 30.0–36.0)
MCV: 105.9 fL — ABNORMAL HIGH (ref 80.0–100.0)
Platelets: 215 10*3/uL (ref 150–400)
RBC: 3.56 MIL/uL — ABNORMAL LOW (ref 3.87–5.11)
RDW: 13.9 % (ref 11.5–15.5)
WBC: 6.6 10*3/uL (ref 4.0–10.5)
nRBC: 0 % (ref 0.0–0.2)

## 2018-06-17 NOTE — Patient Instructions (Signed)
Your procedure is scheduled on: Dr. Rudene Christians to call and set up day. Report to Day Surgery on the 2nd floor of the [facility]. To find out your arrival time, please call [phone] between 1PM - 3PM on: call the day before surgery or on Friday if surgery is scheduled on a Monday.  REMEMBER: Instructions that are not followed completely may result in serious medical risk, up to and including death; or upon the discretion of your surgeon and anesthesiologist your surgery may need to be rescheduled.  Do not eat food after midnight the night before surgery.  No gum chewing, lozengers or hard candies.  You may however, drink CLEAR liquids up until 2 hours prior to arriving to the hospital.  Clear liquids include: - water  - apple juice without pulp - gatorade - black coffee or tea (Do NOT add milk or creamers to the coffee or tea) Do NOT drink anything that is not on this list.  No Alcohol for 24 hours before or after surgery.  On the morning of surgery brush your teeth with toothpaste and water, you may rinse your mouth with mouthwash if you wish. Do not swallow any toothpaste or mouthwash.  Notify your doctor if there is any change in your medical condition (cold, fever, infection).  Do not wear jewelry, make-up, hairpins, clips or nail polish.  Do not wear lotions, powders, or perfumes.   Do not bring valuables to the hospital, including drivers license, insurance or credit cards.  [facility] is not responsible for any belongings or valuables.   TAKE THESE MEDICATIONS THE MORNING OF SURGERY:  1.  Amiodarone 2.  Hydrocodone (if needed for pain) 3.  Levothyroxine 4.  Pantoprazole 5.  Paroxetine  Use CHG Soap or wipes as directed on instruction sheet.   Follow recommendations from your cardiologist or Dr. Rudene Christians regarding stopping Aspirin and Eliquis.  One week prior to surgery:   Stop Anti-inflammatories (NSAIDS) such as Advil, Aleve, Ibuprofen, Motrin, Naproxen, Naprosyn  and Aspirin based products such as Excedrin, Goodys Powder, BC Powder. (May take Tylenol or Acetaminophen if needed.)  One week prior to surgery:  Stop ANY OVER THE COUNTER supplements until after surgery.  Wear comfortable clothing (specific to your surgery type) to the hospital.  If you are being discharged the day of surgery, you will not be allowed to drive home. You will need a responsible adult to drive you home and stay with you that night.   If you are taking public transportation, you will need to have a responsible adult with you. Please confirm with your physician that it is acceptable to use public transportation.   Please call [phone] if you have any questions about these instructions.

## 2018-06-20 ENCOUNTER — Encounter: Payer: Medicare HMO | Attending: Family Medicine | Admitting: Physician Assistant

## 2018-06-20 DIAGNOSIS — Z96652 Presence of left artificial knee joint: Secondary | ICD-10-CM | POA: Insufficient documentation

## 2018-06-20 DIAGNOSIS — I739 Peripheral vascular disease, unspecified: Secondary | ICD-10-CM | POA: Insufficient documentation

## 2018-06-20 DIAGNOSIS — I130 Hypertensive heart and chronic kidney disease with heart failure and stage 1 through stage 4 chronic kidney disease, or unspecified chronic kidney disease: Secondary | ICD-10-CM | POA: Diagnosis not present

## 2018-06-20 DIAGNOSIS — Z923 Personal history of irradiation: Secondary | ICD-10-CM | POA: Diagnosis not present

## 2018-06-20 DIAGNOSIS — L89626 Pressure-induced deep tissue damage of left heel: Secondary | ICD-10-CM | POA: Diagnosis not present

## 2018-06-20 DIAGNOSIS — I5042 Chronic combined systolic (congestive) and diastolic (congestive) heart failure: Secondary | ICD-10-CM | POA: Insufficient documentation

## 2018-06-20 DIAGNOSIS — N184 Chronic kidney disease, stage 4 (severe): Secondary | ICD-10-CM | POA: Insufficient documentation

## 2018-06-20 DIAGNOSIS — L97822 Non-pressure chronic ulcer of other part of left lower leg with fat layer exposed: Secondary | ICD-10-CM | POA: Diagnosis not present

## 2018-06-20 DIAGNOSIS — L84 Corns and callosities: Secondary | ICD-10-CM | POA: Insufficient documentation

## 2018-06-20 DIAGNOSIS — Z853 Personal history of malignant neoplasm of breast: Secondary | ICD-10-CM | POA: Insufficient documentation

## 2018-06-23 NOTE — Progress Notes (Addendum)
[name], [name] ([id number]) Visit Report for 06/20/2018 Arrival Information Details Patient Name: [name], [name]. Date of Service: 06/20/2018 3:15 PM Medical Record Number: [id number] Patient Account Number: [id number] Date of Birth/Sex: 1932-10-12 (85 y.o. F) Treating RN: Harold Barban Primary Care Terrie Grajales: Emily Filbert Other Clinician: Referring Haisley Arens: Emily Filbert Treating Jamesyn Moorefield/Extender: Melburn Hake, HOYT Weeks in Treatment: 13 Visit Information History Since Last Visit Added or deleted any medications: No Patient Arrived: Cane Any new allergies or adverse reactions: No Arrival Time: 15:13 Had a fall or experienced change in No Accompanied By: husband activities of daily living that may affect Transfer Assistance: None risk of falls: Patient Identification Verified: Yes Signs or symptoms of abuse/neglect since last visito No Secondary Verification Process Yes Hospitalized since last visit: No Completed: Has Dressing in Place as Prescribed: Yes Patient Has Alerts: Yes Pain Present Now: No Patient Alerts: Patient on Blood Thinner Eliquis ABI 05/23/17 L.88 R 1.16 Electronic Signature(s) Signed: 06/21/2018 3:21:09 PM By: Harold Barban Entered By: Harold Barban on 06/20/2018 15:19:03 Garland, [name] ([id number]) -------------------------------------------------------------------------------- Clinic Level of Care Assessment Details Patient Name: [name] Date of Service: 06/20/2018 3:15 PM Medical Record Number: [id number] Patient Account Number: [id number] Date of Birth/Sex: [birth year] (85 y.o. F) Treating RN: Army Melia Primary Care Berenice Oehlert: Emily Filbert Other Clinician: Referring Javione Gunawan: Emily Filbert Treating Shahrukh Pasch/Extender: Melburn Hake, HOYT Weeks in Treatment: 13 Clinic Level of Care Assessment Items TOOL 4 Quantity Score X - Use when only an EandM is performed on FOLLOW-UP visit 1 0 ASSESSMENTS - Nursing Assessment / Reassessment X - Reassessment of Co-morbidities  (includes updates in patient status) 1 10 X- 1 5 Reassessment of Adherence to Treatment Plan ASSESSMENTS - Wound and Skin Assessment / Reassessment []  - Simple Wound Assessment / Reassessment - one wound 0 X- 2 5 Complex Wound Assessment / Reassessment - multiple wounds []  - 0 Dermatologic / Skin Assessment (not related to wound area) ASSESSMENTS - Focused Assessment []  - Circumferential Edema Measurements - multi extremities 0 []  - 0 Nutritional Assessment / Counseling / Intervention []  - 0 Lower Extremity Assessment (monofilament, tuning fork, pulses) []  - 0 Peripheral Arterial Disease Assessment (using hand held doppler) ASSESSMENTS - Ostomy and/or Continence Assessment and Care []  - Incontinence Assessment and Management 0 []  - 0 Ostomy Care Assessment and Management (repouching, etc.) PROCESS - Coordination of Care X - Simple Patient / Family Education for ongoing care 1 15 []  - 0 Complex (extensive) Patient / Family Education for ongoing care []  - 0 Staff obtains Programmer, systems, Records, Test Results / Process Orders []  - 0 Staff telephones HHA, Nursing Homes / Clarify orders / etc []  - 0 Routine Transfer to another Facility (non-emergent condition) []  - 0 Routine Hospital Admission (non-emergent condition) []  - 0 New Admissions / Biomedical engineer / Ordering NPWT, Apligraf, etc. []  - 0 Emergency Hospital Admission (emergent condition) X- 1 10 Simple Discharge Coordination [name], [name]. ([id number]) []  - 0 Complex (extensive) Discharge Coordination PROCESS - Special Needs []  - Pediatric / Minor Patient Management 0 []  - 0 Isolation Patient Management []  - 0 Hearing / Language / Visual special needs []  - 0 Assessment of Community assistance (transportation, D/C planning, etc.) []  - 0 Additional assistance / Altered mentation []  - 0 Support Surface(s) Assessment (bed, cushion, seat, etc.) INTERVENTIONS - Wound Cleansing / Measurement []  - Simple Wound  Cleansing - one wound 0 X- 2 5 Complex Wound Cleansing - multiple wounds []  - 0 Wound Imaging (photographs - any number  of wounds) []  - 0 Wound Tracing (instead of photographs) []  - 0 Simple Wound Measurement - one wound X- 2 5 Complex Wound Measurement - multiple wounds INTERVENTIONS - Wound Dressings []  - Small Wound Dressing one or multiple wounds 0 X- 2 15 Medium Wound Dressing one or multiple wounds []  - 0 Large Wound Dressing one or multiple wounds []  - 0 Application of Medications - topical []  - 0 Application of Medications - injection INTERVENTIONS - Miscellaneous []  - External ear exam 0 []  - 0 Specimen Collection (cultures, biopsies, blood, body fluids, etc.) []  - 0 Specimen(s) / Culture(s) sent or taken to Lab for analysis []  - 0 Patient Transfer (multiple staff / Civil Service fast streamer / Similar devices) []  - 0 Simple Staple / Suture removal (25 or less) []  - 0 Complex Staple / Suture removal (26 or more) []  - 0 Hypo / Hyperglycemic Management (close monitor of Blood Glucose) []  - 0 Ankle / Brachial Index (ABI) - do not check if billed separately X- 1 5 Vital Signs [name], Latoya A. ([id number]) Has the patient been seen at the hospital within the last three years: Yes Total Score: 105 Level Of Care: New/Established - Level 3 Electronic Signature(s) Signed: 06/21/2018 3:17:07 PM By: Army Melia Entered By: Army Melia on 06/20/2018 15:43:58 [name], [name] ([id number]) -------------------------------------------------------------------------------- Complex / Palliative Patient Assessment Details Patient Name: [name]. Date of Service: 06/20/2018 3:15 PM Medical Record Number: [id number] Patient Account Number: [id number] Date of Birth/Sex: 1933/01/04 (85 y.o. F) Treating RN: Army Melia Primary Care Kiegan Macaraeg: Emily Filbert Other Clinician: Referring Audric Venn: Emily Filbert Treating Deneene Tarver/Extender: Melburn Hake, HOYT Weeks in Treatment: 13 Palliative Management  Criteria Complex Wound Management Criteria Patient has remarkable or complex co-morbidities requiring medications or treatments that extend wound healing times. Examples: o Diabetes mellitus with chronic renal failure or end stage renal disease requiring dialysis o Advanced or poorly controlled rheumatoid arthritis o Diabetes mellitus and end stage chronic obstructive pulmonary disease o Active cancer with current chemo- or radiation therapy CHF and stage 4 kidney disease Care Approach Wound Care Plan: Complex Wound Management Electronic Signature(s) Signed: 07/16/2018 1:13:01 PM By: Army Melia Signed: 07/16/2018 8:26:01 PM By: Worthy Keeler PA-C Entered By: Army Melia on 07/16/2018 13:13:01 [name], [name] ([id number]) -------------------------------------------------------------------------------- Lower Extremity Assessment Details Patient Name: [name]. Date of Service: 06/20/2018 3:15 PM Medical Record Number: [id number] Patient Account Number: [id number] Date of Birth/Sex: [birth year] (85 y.o. F) Treating RN: Harold Barban Primary Care Koby Pickup: Emily Filbert Other Clinician: Referring Brittanny Levenhagen: Emily Filbert Treating Muzammil Bruins/Extender: Melburn Hake, HOYT Weeks in Treatment: 13 Electronic Signature(s) Signed: 06/21/2018 3:21:09 PM By: Harold Barban Entered By: Harold Barban on 06/20/2018 15:27:27 Tester, [name] ([id number]) -------------------------------------------------------------------------------- Multi Wound Chart Details Patient Name: [name]. Date of Service: 06/20/2018 3:15 PM Medical Record Number: [id number] Patient Account Number: [id number] Date of Birth/Sex: [birth year] (85 y.o. F) Treating RN: Army Melia Primary Care Shaylyn Bawa: Emily Filbert Other Clinician: Referring Nigel Wessman: Emily Filbert Treating Akirah Storck/Extender: Melburn Hake, HOYT Weeks in Treatment: 13 Vital Signs Height(in): 68 Pulse(bpm): 72 Weight(lbs): 121 Blood Pressure(mmHg):  145/40 Body Mass Index(BMI): 18 Temperature(F): 98.0 Respiratory Rate 18 (breaths/min): Photos: [N/A:N/A] Wound Location: Left Lower Leg - Anterior, Left Calcaneus N/A Distal Wounding Event: Shear/Friction Pressure Injury N/A Primary Etiology: Skin Tear Pressure Ulcer N/A Comorbid History: Cataracts, Anemia, Cataracts, Anemia, N/A Arrhythmia, Hypertension, Arrhythmia, Hypertension, Osteoarthritis, Received Osteoarthritis, Received Radiation Radiation Date Acquired: 03/05/2018 03/11/2018 N/A Weeks of Treatment: 13 13 N/A Wound Status: Open Open  N/A Measurements L x W x D 1.5x1x0.1 0.2x0.3x0.1 N/A (cm) Area (cm) : 1.178 0.047 N/A Volume (cm) : 0.118 0.005 N/A % Reduction in Area: 92.00% 90.00% N/A % Reduction in Volume: 96.00% 89.40% N/A Classification: Full Thickness Without Category/Stage III N/A Exposed Support Structures Exudate Amount: Medium Small N/A Exudate Type: Purulent Serous N/A Exudate Color: yellow, brown, green amber N/A Wound Margin: Flat and Intact Indistinct, nonvisible N/A Granulation Amount: Small (1-33%) Medium (34-66%) N/A Granulation Quality: Pink Pale N/A Necrotic Amount: Medium (34-66%) Small (1-33%) N/A Exposed Structures: Fat Layer (Subcutaneous Fat Layer (Subcutaneous N/A Tissue) Exposed: Yes Tissue) Exposed: Yes Fascia: No Fascia: No Rollings, Roiza A. ([id number]) Tendon: No Tendon: No Muscle: No Muscle: No Joint: No Joint: No Bone: No Bone: No Epithelialization: None Small (1-33%) N/A Periwound Skin Texture: Excoriation: No Excoriation: No N/A Induration: No Induration: No Callus: No Callus: No Crepitus: No Crepitus: No Rash: No Rash: No Scarring: No Scarring: No Periwound Skin Moisture: Maceration: No Dry/Scaly: Yes N/A Dry/Scaly: No Maceration: No Periwound Skin Color: Atrophie Blanche: No Atrophie Blanche: No N/A Cyanosis: No Cyanosis: No Ecchymosis: No Ecchymosis: No Erythema: No Erythema: No Hemosiderin  Staining: No Hemosiderin Staining: No Mottled: No Mottled: No Pallor: No Pallor: No Rubor: No Rubor: No Temperature: No Abnormality No Abnormality N/A Tenderness on Palpation: Yes Yes N/A Wound Preparation: Ulcer Cleansing: Ulcer Cleansing: N/A Rinsed/Irrigated with Saline Rinsed/Irrigated with Saline Topical Anesthetic Applied: Topical Anesthetic Applied: Other: lidocaine 4% Other: lidocaine 4% Treatment Notes Electronic Signature(s) Signed: 06/21/2018 3:17:07 PM By: Army Melia Entered By: Army Melia on 06/20/2018 15:34:52 [name], [name] ([id number]) -------------------------------------------------------------------------------- [place] Details Patient Name: [name]. Date of Service: 06/20/2018 3:15 PM Medical Record Number: [id number] Patient Account Number: [id number] Date of Birth/Sex: 1933-03-20 (85 y.o. F) Treating RN: Army Melia Primary Care Kemiah Booz: Emily Filbert Other Clinician: Referring Senovia Gauer: Emily Filbert Treating Mikailah Morel/Extender: Melburn Hake, HOYT Weeks in Treatment: 43 Active Inactive Abuse / Safety / Falls / Self Care Management Nursing Diagnoses: Potential for falls Goals: Patient will not develop complications from immobility Date Initiated: 03/19/2018 Target Resolution Date: 06/22/2018 Goal Status: Active Interventions: Assess fall risk on admission and as needed Notes: Nutrition Nursing Diagnoses: Potential for alteratiion in Nutrition/Potential for imbalanced nutrition Goals: Patient/caregiver agrees to and verbalizes understanding of need to use nutritional supplements and/or vitamins as prescribed Date Initiated: 03/19/2018 Target Resolution Date: 06/22/2018 Goal Status: Active Interventions: Assess patient nutrition upon admission and as needed per policy Notes: Orientation to the Wound Care Program Nursing Diagnoses: Knowledge deficit related to the wound healing center program Goals: Patient/caregiver will  verbalize understanding of the [place] Program Date Initiated: 03/19/2018 Target Resolution Date: 06/22/2018 Goal Status: Active Interventions: Provide education on orientation to the wound center JAYDYN, MENON. ([id number]) Notes: Pressure Nursing Diagnoses: Potential for impaired tissue integrity related to pressure, friction, moisture, and shear Goals: Patient will remain free from development of additional pressure ulcers Date Initiated: 03/19/2018 Target Resolution Date: 06/22/2018 Goal Status: Active Interventions: Assess potential for pressure ulcer upon admission and as needed Provide education on pressure ulcers Notes: Wound/Skin Impairment Nursing Diagnoses: Impaired tissue integrity Goals: Ulcer/skin breakdown will heal within 14 weeks Date Initiated: 03/19/2018 Target Resolution Date: 06/22/2018 Goal Status: Active Interventions: Assess patient/caregiver ability to obtain necessary supplies Assess patient/caregiver ability to perform ulcer/skin care regimen upon admission and as needed Assess ulceration(s) every visit Notes: Electronic Signature(s) Signed: 06/21/2018 3:17:07 PM By: Army Melia Entered By: Army Melia on 06/20/2018 15:34:38 [name], Carefree A. (  [id number]) -------------------------------------------------------------------------------- Pain Assessment Details Patient Name: [name], [name]. Date of Service: 06/20/2018 3:15 PM Medical Record Number: [id number] Patient Account Number: [id number] Date of Birth/Sex: 19-May-1932 (85 y.o. F) Treating RN: Harold Barban Primary Care Lativia Velie: Emily Filbert Other Clinician: Referring Makilah Dowda: Emily Filbert Treating Josph Norfleet/Extender: Melburn Hake, HOYT Weeks in Treatment: 13 Active Problems Location of Pain Severity and Description of Pain Patient Has Paino No Site Locations Pain Management and Medication Current Pain Management: Electronic Signature(s) Signed: 06/21/2018 3:21:09 PM By: Harold Barban Entered By:  Harold Barban on 06/20/2018 15:19:18 [name], [name] ([id number]) -------------------------------------------------------------------------------- Patient/Caregiver Education Details Patient Name: [name]. Date of Service: 06/20/2018 3:15 PM Medical Record Number: [id number] Patient Account Number: [id number] Date of Birth/Gender: 21-Mar-1933 (85 y.o. F) Treating RN: Army Melia Primary Care Physician: Emily Filbert Other Clinician: Referring Physician: Emily Filbert Treating Physician/Extender: Sharalyn Ink in Treatment: 13 Education Assessment Education Provided To: Patient Education Topics Provided Wound/Skin Impairment: Handouts: Caring for Your Ulcer Methods: Demonstration, Explain/Verbal Responses: State content correctly Electronic Signature(s) Signed: 06/21/2018 3:17:07 PM By: Army Melia Entered By: Army Melia on 06/20/2018 15:44:12 [name], [name] ([id number]) -------------------------------------------------------------------------------- Wound Assessment Details Patient Name: [name]. Date of Service: 06/20/2018 3:15 PM Medical Record Number: [id number] Patient Account Number: [id number] Date of Birth/Sex: June 29, 1932 (85 y.o. F) Treating RN: Harold Barban Primary Care Aislynn Cifelli: Emily Filbert Other Clinician: Referring Shmuel Girgis: Emily Filbert Treating Takyah Ciaramitaro/Extender: Melburn Hake, HOYT Weeks in Treatment: 13 Wound Status Wound Number: 15 Primary Skin Tear Etiology: Wound Location: Left Lower Leg - Anterior, Distal Wound Open Wounding Event: Shear/Friction Status: Date Acquired: 03/05/2018 Comorbid Cataracts, Anemia, Arrhythmia, Hypertension, Weeks Of Treatment: 13 History: Osteoarthritis, Received Radiation Clustered Wound: No Photos Wound Measurements Length: (cm) 1.5 Width: (cm) 1 Depth: (cm) 0.1 Area: (cm) 1.178 Volume: (cm) 0.118 % Reduction in Area: 92% % Reduction in Volume: 96% Epithelialization: None Tunneling: No Undermining:  No Wound Description Full Thickness Without Exposed Support Foul Odo Classification: Structures Slough/F Wound Margin: Flat and Intact Exudate Medium Amount: Exudate Type: Purulent Exudate Color: yellow, brown, green r After Cleansing: No ibrino Yes Wound Bed Granulation Amount: Small (1-33%) Exposed Structure Granulation Quality: Pink Fascia Exposed: No Necrotic Amount: Medium (34-66%) Fat Layer (Subcutaneous Tissue) Exposed: Yes Necrotic Quality: Adherent Slough Tendon Exposed: No Muscle Exposed: No Joint Exposed: No Bone Exposed: No Periwound Skin Texture [name], Latoya A. ([id number]) Texture Color No Abnormalities Noted: No No Abnormalities Noted: No Callus: No Atrophie Blanche: No Crepitus: No Cyanosis: No Excoriation: No Ecchymosis: No Induration: No Erythema: No Rash: No Hemosiderin Staining: No Scarring: No Mottled: No Pallor: No Moisture Rubor: No No Abnormalities Noted: No Dry / Scaly: No Temperature / Pain Maceration: No Temperature: No Abnormality Tenderness on Palpation: Yes Wound Preparation Ulcer Cleansing: Rinsed/Irrigated with Saline Topical Anesthetic Applied: Other: lidocaine 4%, Electronic Signature(s) Signed: 06/21/2018 3:21:09 PM By: Harold Barban Entered By: Harold Barban on 06/20/2018 15:26:26 Korson, [name] ([id number]) -------------------------------------------------------------------------------- Wound Assessment Details Patient Name: [name]. Date of Service: 06/20/2018 3:15 PM Medical Record Number: [id number] Patient Account Number: [id number] Date of Birth/Sex: 19-Dec-1932 (85 y.o. F) Treating RN: Harold Barban Primary Care Audrie Kuri: Emily Filbert Other Clinician: Referring Royce Stegman: Emily Filbert Treating Luccia Reinheimer/Extender: Melburn Hake, HOYT Weeks in Treatment: 13 Wound Status Wound Number: 16 Primary Pressure Ulcer Etiology: Wound Location: Left Calcaneus Wound Open Wounding Event: Pressure Injury Status: Date Acquired:  03/11/2018 Comorbid Cataracts, Anemia, Arrhythmia, Hypertension, Weeks Of Treatment: 13 History: Osteoarthritis, Received Radiation Clustered Wound: No Photos Wound Measurements Length: (  cm) 0.2 % Reduction Width: (cm) 0.3 % Reduction Depth: (cm) 0.1 Epithelializ Area: (cm) 0.047 Tunneling: Volume: (cm) 0.005 Undermining in Area: 90% in Volume: 89.4% ation: Small (1-33%) No : No Wound Description Classification: Category/Stage III Foul Odor Af Wound Margin: Indistinct, nonvisible Slough/Fibri Exudate Amount: Medium Exudate Type: Serous Exudate Color: amber ter Cleansing: No no Yes Wound Bed Granulation Amount: Medium (34-66%) Exposed Structure Granulation Quality: Pale Fascia Exposed: No Necrotic Amount: Small (1-33%) Fat Layer (Subcutaneous Tissue) Exposed: Yes Necrotic Quality: Adherent Slough Tendon Exposed: No Muscle Exposed: No Joint Exposed: No Bone Exposed: No Periwound Skin Texture Texture Color Hoar, Jolana A. ([id number]) No Abnormalities Noted: No No Abnormalities Noted: No Callus: No Atrophie Blanche: No Crepitus: No Cyanosis: No Excoriation: No Ecchymosis: No Induration: No Erythema: No Rash: No Hemosiderin Staining: No Scarring: No Mottled: No Pallor: No Moisture Rubor: No No Abnormalities Noted: No Dry / Scaly: Yes Temperature / Pain Maceration: No Temperature: No Abnormality Tenderness on Palpation: Yes Wound Preparation Ulcer Cleansing: Rinsed/Irrigated with Saline Topical Anesthetic Applied: Other: lidocaine 4%, Electronic Signature(s) Signed: 06/21/2018 8:35:46 AM By: Army Melia Signed: 06/21/2018 3:21:09 PM By: Harold Barban Entered By: Army Melia on 06/21/2018 08:35:45 Marinez, [name] ([id number]) -------------------------------------------------------------------------------- Vitals Details Patient Name: [name]. Date of Service: 06/20/2018 3:15 PM Medical Record Number: [id number] Patient Account Number: [id number] Date of  Birth/Sex: 1932/12/31 (85 y.o. F) Treating RN: Harold Barban Primary Care Monda Chastain: Emily Filbert Other Clinician: Referring Maryruth Apple: Emily Filbert Treating Tamalyn Wadsworth/Extender: Melburn Hake, HOYT Weeks in Treatment: 13 Vital Signs Time Taken: 15:15 Temperature (F): 98.0 Height (in): 68 Pulse (bpm): 72 Weight (lbs): 121 Respiratory Rate (breaths/min): 18 Body Mass Index (BMI): 18.4 Blood Pressure (mmHg): 145/40 Reference Range: 80 - 120 mg / dl Electronic Signature(s) Signed: 06/21/2018 3:21:09 PM By: Harold Barban Entered By: Harold Barban on 06/20/2018 15:19:46

## 2018-06-24 MED ORDER — CLINDAMYCIN PHOSPHATE 900 MG/50ML IV SOLN
900.0000 mg | Freq: Once | INTRAVENOUS | Status: AC
  Administered 2018-06-25: 900 mg via INTRAVENOUS

## 2018-06-24 NOTE — Progress Notes (Signed)
VRINDA, HECKSTALL ([id number]) Visit Report for 05/23/2018 Arrival Information Details Patient Name: [name], [name]. Date of Service: 05/23/2018 1:45 PM Medical Record Number: [id number] Patient Account Number: [id number] Date of Birth/Sex: 01-12-1933 (85 y.o. F) Treating RN: Harold Barban Primary Care Avalie Oconnor: Emily Filbert Other Clinician: Referring Rosamond Andress: Emily Filbert Treating Calhoun Reichardt/Extender: Oneida Arenas in Treatment: 9 Visit Information History Since Last Visit Added or deleted any medications: No Patient Arrived: Ambulatory Any new allergies or adverse reactions: No Arrival Time: 13:51 Had a fall or experienced change in No Accompanied By: husband activities of daily living that may affect Transfer Assistance: None risk of falls: Patient Identification Verified: Yes Signs or symptoms of abuse/neglect since last visito No Secondary Verification Process Yes Hospitalized since last visit: No Completed: Implantable device outside of the clinic excluding No Patient Has Alerts: Yes cellular tissue based products placed in the center Patient Alerts: Patient on Blood since last visit: Thinner Has Dressing in Place as Prescribed: Yes Eliquis Pain Present Now: No ABI 05/23/17 L.88 R 1.16 Electronic Signature(s) Signed: 05/23/2018 4:29:20 PM By: Lorine Bears RCP, RRT, CHT Entered By: Lorine Bears on 05/23/2018 13:52:30 Rinks, [name] ([id number]) -------------------------------------------------------------------------------- Lower Extremity Assessment Details Patient Name: [name]. Date of Service: 05/23/2018 1:45 PM Medical Record Number: [id number] Patient Account Number: [id number] Date of Birth/Sex: Oct 10, 1932 (85 y.o. F) Treating RN: Montey Hora Primary Care Ellice Boultinghouse: Emily Filbert Other Clinician: Referring Malee Grays: Emily Filbert Treating Deanette Tullius/Extender: Beather Arbour Weeks in Treatment: 9 Vascular Assessment Pulses: Dorsalis  Pedis Palpable: [Left:Yes] Posterior Tibial Extremity colors, hair growth, and conditions: Extremity Color: [Left:Hyperpigmented] Hair Growth on Extremity: [Left:No] Temperature of Extremity: [Left:Warm] Capillary Refill: [Left:< 3 seconds] Toe Nail Assessment Left: Right: Thick: Yes Discolored: No Deformed: No Improper Length and Hygiene: No Electronic Signature(s) Signed: 05/23/2018 4:19:21 PM By: Montey Hora Entered By: Montey Hora on 05/23/2018 14:05:48 Agard, [name] ([id number]) -------------------------------------------------------------------------------- Multi Wound Chart Details Patient Name: [name]. Date of Service: 05/23/2018 1:45 PM Medical Record Number: [id number] Patient Account Number: [id number] Date of Birth/Sex: 1932-05-27 (85 y.o. F) Treating RN: Harold Barban Primary Care Jalani Cullifer: Emily Filbert Other Clinician: Referring Janiyah Beery: Emily Filbert Treating Cinthya Bors/Extender: Beather Arbour Weeks in Treatment: 9 Vital Signs Height(in): 68 Pulse(bpm): 44 Weight(lbs): 121 Blood Pressure(mmHg): 156/42 Body Mass Index(BMI): 18 Temperature(F): 98.0 Respiratory Rate 16 (breaths/min): Photos: [N/A:N/A] Wound Location: Left Lower Leg - Anterior, Left Calcaneus N/A Distal Wounding Event: Shear/Friction Pressure Injury N/A Primary Etiology: Skin Tear Pressure Ulcer N/A Comorbid History: Cataracts, Anemia, Cataracts, Anemia, N/A Arrhythmia, Hypertension, Arrhythmia, Hypertension, Osteoarthritis, Received Osteoarthritis, Received Radiation Radiation Date Acquired: 03/05/2018 03/11/2018 N/A Weeks of Treatment: 9 9 N/A Wound Status: Open Open N/A Measurements L x W x D 2.6x2.9x0.1 1.2x1.6x0.1 N/A (cm) Area (cm) : 5.922 1.508 N/A Volume (cm) : 0.592 0.151 N/A % Reduction in Area: 59.90% -220.20% N/A % Reduction in Volume: 80.00% -221.30% N/A Classification: Full Thickness Without Category/Stage III N/A Exposed Support Structures Exudate Amount:  Medium Medium N/A Exudate Type: Purulent Purulent N/A Exudate Color: yellow, brown, green yellow, brown, green N/A Wound Margin: Flat and Intact Indistinct, nonvisible N/A Granulation Amount: Small (1-33%) Medium (34-66%) N/A Granulation Quality: Pink Pale N/A Necrotic Amount: Large (67-100%) Medium (34-66%) N/A Exposed Structures: Fat Layer (Subcutaneous Fat Layer (Subcutaneous N/A Tissue) Exposed: Yes Tissue) Exposed: Yes Fascia: No Fascia: No Ong, Donnajean A. ([id number]) Tendon: No Tendon: No Muscle: No Muscle: No Joint: No Joint: No Bone: No Bone: No Epithelialization: None Small (1-33%) N/A Debridement: Debridement -  Selective/Open Debridement - Selective/Open N/A Wound Wound Pre-procedure 14:39 14:39 N/A Verification/Time Out Taken: Pain Control: Lidocaine Lidocaine N/A Tissue Debrided: Express Scripts, Slough N/A Level: Non-Viable Tissue Non-Viable Tissue N/A Debridement Area (sq cm): 7.54 1.92 N/A Instrument: Curette Curette N/A Bleeding: Minimum Minimum N/A Hemostasis Achieved: Pressure Pressure N/A Procedural Pain: 0 0 N/A Post Procedural Pain: 0 0 N/A Debridement Treatment Procedure was tolerated well Procedure was tolerated well N/A Response: Post Debridement 2.6x2.9x0.1 1.2x1.6x0.1 N/A Measurements L x W x D (cm) Post Debridement Volume: 0.592 0.151 N/A (cm) Post Debridement Stage: N/A Category/Stage III N/A Periwound Skin Texture: Excoriation: No Excoriation: No N/A Induration: No Induration: No Callus: No Callus: No Crepitus: No Crepitus: No Rash: No Rash: No Scarring: No Scarring: No Periwound Skin Moisture: Maceration: No Maceration: No N/A Dry/Scaly: No Dry/Scaly: No Periwound Skin Color: Erythema: Yes Atrophie Blanche: No N/A Atrophie Blanche: No Cyanosis: No Cyanosis: No Ecchymosis: No Ecchymosis: No Erythema: No Hemosiderin Staining: No Hemosiderin Staining: No Mottled: No Mottled: No Pallor: No Pallor: No Rubor: No Rubor:  No Erythema Location: Circumferential N/A N/A Temperature: No Abnormality No Abnormality N/A Tenderness on Palpation: Yes Yes N/A Wound Preparation: Ulcer Cleansing: Ulcer Cleansing: N/A Rinsed/Irrigated with Saline Rinsed/Irrigated with Saline Topical Anesthetic Applied: Topical Anesthetic Applied: Other: lidocaine 4% Other: lidocaine 4% Procedures Performed: Debridement Debridement N/A Treatment Notes Electronic Signature(s) Signed: 05/25/2018 6:43:56 PM By: Beather Arbour FNP-C Coley, [name] ([id number]) Entered By: Beather Arbour on 05/23/2018 14:59:23 Holohan, [name] ([id number]) -------------------------------------------------------------------------------- [place] Details Patient Name: [name]. Date of Service: 05/23/2018 1:45 PM Medical Record Number: [id number] Patient Account Number: [id number] Date of Birth/Sex: 1933/03/14 (85 y.o. F) Treating RN: Harold Barban Primary Care Adrie Picking: Emily Filbert Other Clinician: Referring Veanna Dower: Emily Filbert Treating Arin Peral/Extender: Oneida Arenas in Treatment: 9 Active Inactive Abuse / Safety / Falls / Self Care Management Nursing Diagnoses: Potential for falls Goals: Patient will not develop complications from immobility Date Initiated: 03/19/2018 Target Resolution Date: 06/22/2018 Goal Status: Active Interventions: Assess fall risk on admission and as needed Notes: Nutrition Nursing Diagnoses: Potential for alteratiion in Nutrition/Potential for imbalanced nutrition Goals: Patient/caregiver agrees to and verbalizes understanding of need to use nutritional supplements and/or vitamins as prescribed Date Initiated: 03/19/2018 Target Resolution Date: 06/22/2018 Goal Status: Active Interventions: Assess patient nutrition upon admission and as needed per policy Notes: Orientation to the Wound Care Program Nursing Diagnoses: Knowledge deficit related to the wound healing center  program Goals: Patient/caregiver will verbalize understanding of the [facility] Date Initiated: 03/19/2018 Target Resolution Date: 06/22/2018 Goal Status: Active Interventions: Provide education on orientation to the wound center GERALYNN, CAPRI. ([id number]) Notes: Pressure Nursing Diagnoses: Potential for impaired tissue integrity related to pressure, friction, moisture, and shear Goals: Patient will remain free from development of additional pressure ulcers Date Initiated: 03/19/2018 Target Resolution Date: 06/22/2018 Goal Status: Active Interventions: Assess potential for pressure ulcer upon admission and as needed Provide education on pressure ulcers Notes: Wound/Skin Impairment Nursing Diagnoses: Impaired tissue integrity Goals: Ulcer/skin breakdown will heal within 14 weeks Date Initiated: 03/19/2018 Target Resolution Date: 06/22/2018 Goal Status: Active Interventions: Assess patient/caregiver ability to obtain necessary supplies Assess patient/caregiver ability to perform ulcer/skin care regimen upon admission and as needed Assess ulceration(s) every visit Notes: Electronic Signature(s) Signed: 06/24/2018 10:54:06 AM By: Harold Barban Entered By: Harold Barban on 05/23/2018 14:37:10 Clagg, [name] ([id number]) -------------------------------------------------------------------------------- Pain Assessment Details Patient Name: [name]. Date of Service: 05/23/2018 1:45 PM Medical Record Number: [id number] Patient  Account Number: [id number] Date of Birth/Sex: February 14, 1933 (85 y.o. F) Treating RN: Harold Barban Primary Care Matison Nuccio: Emily Filbert Other Clinician: Referring Lamoyne Hessel: Emily Filbert Treating Koden Hunzeker/Extender: Beather Arbour Weeks in Treatment: 9 Active Problems Location of Pain Severity and Description of Pain Patient Has Paino No Site Locations Pain Management and Medication Current Pain Management: Electronic Signature(s) Signed:  05/23/2018 4:29:20 PM By: Lorine Bears RCP, RRT, CHT Signed: 06/24/2018 10:54:06 AM By: Harold Barban Entered By: Lorine Bears on 05/23/2018 13:52:37 Troeger, [name] ([id number]) -------------------------------------------------------------------------------- Patient/Caregiver Education Details Patient Name: [name]. Date of Service: 05/23/2018 1:45 PM Medical Record Number: [id number] Patient Account Number: [id number] Date of Birth/Gender: 10-27-1932 (85 y.o. F) Treating RN: Harold Barban Primary Care Physician: Emily Filbert Other Clinician: Referring Physician: Emily Filbert Treating Physician/Extender: Oneida Arenas in Treatment: 9 Education Assessment Education Provided To: Patient Education Topics Provided Wound/Skin Impairment: Handouts: Caring for Your Ulcer Methods: Demonstration, Explain/Verbal Responses: State content correctly Electronic Signature(s) Signed: 06/24/2018 10:54:06 AM By: Harold Barban Entered By: Harold Barban on 05/23/2018 14:37:39 Scurlock, [name] ([id number]) -------------------------------------------------------------------------------- Wound Assessment Details Patient Name: [name]. Date of Service: 05/23/2018 1:45 PM Medical Record Number: [id number] Patient Account Number: [id number] Date of Birth/Sex: 07-Sep-1932 (85 y.o. F) Treating RN: Montey Hora Primary Care Terryn Rosenkranz: Emily Filbert Other Clinician: Referring Deonna Krummel: Emily Filbert Treating Thimothy Barretta/Extender: Beather Arbour Weeks in Treatment: 9 Wound Status Wound Number: 15 Primary Skin Tear Etiology: Wound Location: Left Lower Leg - Anterior, Distal Wound Open Wounding Event: Shear/Friction Status: Date Acquired: 03/05/2018 Comorbid Cataracts, Anemia, Arrhythmia, Hypertension, Weeks Of Treatment: 9 History: Osteoarthritis, Received Radiation Clustered Wound: No Photos Wound Measurements Length: (cm) 2.6 Width: (cm) 2.9 Depth: (cm) 0.1 Area:  (cm) 5.922 Volume: (cm) 0.592 % Reduction in Area: 59.9% % Reduction in Volume: 80% Epithelialization: None Tunneling: No Undermining: No Wound Description Full Thickness Without Exposed Support Foul Odo Classification: Structures Slough/F Wound Margin: Flat and Intact Exudate Medium Amount: Exudate Type: Purulent Exudate Color: yellow, brown, green r After Cleansing: No ibrino Yes Wound Bed Granulation Amount: Small (1-33%) Exposed Structure Granulation Quality: Pink Fascia Exposed: No Necrotic Amount: Large (67-100%) Fat Layer (Subcutaneous Tissue) Exposed: Yes Necrotic Quality: Adherent Slough Tendon Exposed: No Muscle Exposed: No Joint Exposed: No Bone Exposed: No Periwound Skin Texture Laham, Estoria A. ([id number]) Texture Color No Abnormalities Noted: No No Abnormalities Noted: No Callus: No Atrophie Blanche: No Crepitus: No Cyanosis: No Excoriation: No Ecchymosis: No Induration: No Erythema: Yes Rash: No Erythema Location: Circumferential Scarring: No Hemosiderin Staining: No Mottled: No Moisture Pallor: No No Abnormalities Noted: No Rubor: No Dry / Scaly: No Maceration: No Temperature / Pain Temperature: No Abnormality Tenderness on Palpation: Yes Wound Preparation Ulcer Cleansing: Rinsed/Irrigated with Saline Topical Anesthetic Applied: Other: lidocaine 4%, Electronic Signature(s) Signed: 05/23/2018 3:08:45 PM By: Harold Barban Signed: 05/23/2018 4:19:21 PM By: Montey Hora Entered By: Harold Barban on 05/23/2018 15:08:44 Gikas, [name] ([id number]) -------------------------------------------------------------------------------- Wound Assessment Details Patient Name: [name]. Date of Service: 05/23/2018 1:45 PM Medical Record Number: [id number] Patient Account Number: [id number] Date of Birth/Sex: Apr 18, 1932 (85 y.o. F) Treating RN: Montey Hora Primary Care Arris Meyn: Emily Filbert Other Clinician: Referring Willadean Guyton: Emily Filbert Treating Chaddrick Brue/Extender: Beather Arbour Weeks in Treatment: 9 Wound Status Wound Number: 16 Primary Pressure Ulcer Etiology: Wound Location: Left Calcaneus Wound Open Wounding Event: Pressure Injury Status: Date Acquired: 03/11/2018 Comorbid Cataracts, Anemia, Arrhythmia, Hypertension, Weeks Of Treatment: 9 History: Osteoarthritis, Received Radiation Clustered Wound: No Photos Wound Measurements Length: (cm) 1.2 %  Reduction Width: (cm) 1.6 % Reduction Depth: (cm) 0.1 Epithelializ Area: (cm) 1.508 Tunneling: Volume: (cm) 0.151 Undermining in Area: -220.2% in Volume: -221.3% ation: Small (1-33%) No : No Wound Description Classification: Category/Stage III Foul Odor Af Wound Margin: Indistinct, nonvisible Slough/Fibri Exudate Amount: Medium Exudate Type: Purulent Exudate Color: yellow, brown, green ter Cleansing: No no Yes Wound Bed Granulation Amount: Medium (34-66%) Exposed Structure Granulation Quality: Pale Fascia Exposed: No Necrotic Amount: Medium (34-66%) Fat Layer (Subcutaneous Tissue) Exposed: Yes Necrotic Quality: Adherent Slough Tendon Exposed: No Muscle Exposed: No Joint Exposed: No Bone Exposed: No Periwound Skin Texture Texture Color Winberg, Sophira A. ([id number]) No Abnormalities Noted: No No Abnormalities Noted: No Callus: No Atrophie Blanche: No Crepitus: No Cyanosis: No Excoriation: No Ecchymosis: No Induration: No Erythema: No Rash: No Hemosiderin Staining: No Scarring: No Mottled: No Pallor: No Moisture Rubor: No No Abnormalities Noted: No Dry / Scaly: No Temperature / Pain Maceration: No Temperature: No Abnormality Tenderness on Palpation: Yes Wound Preparation Ulcer Cleansing: Rinsed/Irrigated with Saline Topical Anesthetic Applied: Other: lidocaine 4%, Electronic Signature(s) Signed: 05/23/2018 3:09:03 PM By: Harold Barban Signed: 05/23/2018 4:19:21 PM By: Montey Hora Entered By: Harold Barban on 05/23/2018  15:09:03 Richer, [name] ([id number]) -------------------------------------------------------------------------------- Vitals Details Patient Name: [name]. Date of Service: 05/23/2018 1:45 PM Medical Record Number: [id number] Patient Account Number: [id number] Date of Birth/Sex: December 29, 1932 (85 y.o. F) Treating RN: Harold Barban Primary Care Sydnee Lamour: Emily Filbert Other Clinician: Referring Adelheid Hoggard: Emily Filbert Treating Gabreille Dardis/Extender: Beather Arbour Weeks in Treatment: 9 Vital Signs Time Taken: 13:52 Temperature (F): 98.0 Height (in): 68 Pulse (bpm): 63 Weight (lbs): 121 Respiratory Rate (breaths/min): 16 Body Mass Index (BMI): 18.4 Blood Pressure (mmHg): 156/42 Reference Range: 80 - 120 mg / dl Electronic Signature(s) Signed: 05/23/2018 4:29:20 PM By: Lorine Bears RCP, RRT, CHT Entered By: Lorine Bears on 05/23/2018 13:57:39

## 2018-06-24 NOTE — Progress Notes (Signed)
ARADHYA, SHELLENBARGER ([id number]) Visit Report for 06/20/2018 Chief Complaint Document Details Patient Name: [name], [name]. Date of Service: 06/20/2018 3:15 PM Medical Record Number: [id number] Patient Account Number: [id number] Date of Birth/Sex: July 04, 1932 (85 y.o. F) Treating RN: Army Melia Primary Care Provider: Emily Filbert Other Clinician: Referring Provider: Emily Filbert Treating Provider/Extender: Melburn Hake, HOYT Weeks in Treatment: 13 Information Obtained from: Patient Chief Complaint Left heel DTI and left LE ulcer Electronic Signature(s) Signed: 06/23/2018 11:30:14 PM By: Worthy Keeler PA-C Entered By: Worthy Keeler on 06/20/2018 15:11:16 Mione, [name] ([id number]) -------------------------------------------------------------------------------- HPI Details Patient Name: [name] Date of Service: 06/20/2018 3:15 PM Medical Record Number: [id number] Patient Account Number: [id number] Date of Birth/Sex: [birth year] (85 y.o. F) Treating RN: Army Melia Primary Care Provider: Emily Filbert Other Clinician: Referring Provider: Emily Filbert Treating Provider/Extender: Melburn Hake, HOYT Weeks in Treatment: 13 History of Present Illness HPI Description: 85 year old patient who is known to the wound center for several years now has a new injury to the right lower extremity where she bumped herself against the bath tub. He also has had a chronic right lateral malleolus open wound which has been coming on and off for several years now. This has been there for at least 2014 and was healed out in November 2016. In the past she has been seen by her cardiologist Dr. Kathlyn Sacramento in April 2016. Noninvasive vascular evaluation showed an ABI of 0.68 on the right and 0.85 on the left. 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 One-vessel runoff below the knee via the peroneal artery with short occlusion of the anterior tibial artery  and reconstitution. No intervention performed. Most recently she was seen for a wound on her right lower extremity which was healed out by March 15 She is not a smoker and has had no diabetes mellitus. 07/29/2015 -- the appointment with her cardiologist Dr. Fletcher Anon is still pending for review of her arterial duplex studies. Readmission: 03/19/18 on evaluation today patient presents for readmission concerning an issue she has been having with her left heel and left anterior lower extremity. She has a history of atrial fibrillation and had knee surgery for a total knee replacement three weeks ago today. Subsequently since being discharged from the hospital it was noted that she did have a left heel deep tissue injury as well the left anterior lower extremity ulcer. This has been present apparently for roughly 2 weeks. She does not appear to have any evidence of infection which is good news. She has been tolerating the dressing changes that have been performed it sounds as if Santyl is what has been used at least most recently. Obviously I think this is likely a good treatment choice for her. Especially in light of the fact that the wound on the distal lower extremity of the left leg appears to show signs of significant slough/eschar buildup which is starting to loosen up now that she is using the central but prior was fairly significant as far as how hard and black it was. She does have pain at both sites the open wound more than the deep tissue injury. Otherwise the patient does have a history of peripheral vascular disease, congestive heart failure, chronic kidney disease stage IV, and now the presence of a left artificial knee joint. 03/28/18 on evaluation today patient appears to be doing a little worse in regard to the dorsal surface of her lower extremity. She has  been tolerating the dressing changes without complication although she states there's a lot of pain at this location. Subsequently in  regard to her left heel the show signs of continued to drain with maceration noted. I think that some of the dead tissue needs to be removed from the surface of the wound at the site to allow it to heal appropriately. 04/02/18 on evaluation today patient's wounds actually appear to be doing better at this point in regard to the dorsal wound on the anterior lower leg as well as the heel ulcer. She does have an area of thicker eschar of the heel which I think actually needs to be loosened up I think cental or Iodoflex could be beneficial in this regard. Otherwise debridement will likely be undertaken today over the lower leg region in order to clear away some of the necrotic material. 04/09/18 on evaluation today a lot of the skin which is around the patient's heel ulcer actually is lifting off which is good news. There is some Slough noted on the surface of one centrally this is going to require sharp debridement. The dorsal surface of her foot wound actually appears to be doing much better which is great news. Overall I'm fairly pleased with how things seem to be progressing. The patient likewise is worried about her heel but otherwise feels like things are going fairly well. 04/23/18 on evaluation today patient appears to be doing fairly well in regard to her ulcer. She has been tolerating the dressing changes with the Santyl and overall I do believe this has been of benefit. With that being said she is still having a lot of issues with pain on the dorsal aspect of her foot although I do believe she's slowly making progress which is good news. No fevers, chills, nausea, or vomiting noted at this time. [name], [name] ([id number]) 04/30/18 on evaluation today patient unfortunately appears to be doing a little bit worse I'm concerned about cellulitis around her wounds. Fortunately there does not appear to be any evidence of infection at this time which is good news. That is systemically. Unfortunately she  does have increased pain. 05/07/18 on evaluation today patient appears to be doing decently well in regard to her wounds at this point. With that being said she tells me that she had a fall where she actually struck her head on Saturday roughly 72 hours ago. With that being said she doesn't note any dizziness or confusion but has had a headache. She has continued to take the Keflex which was prescribed for her. She is on Eliquis which obviously is somewhat of a concern. The other issue is that upon evaluation today she seemed to have a very interesting issue going on with her blood pressure we were able to obtain a systolic blood pressure manually though the diastolic pressure was around 32 with the machine and when I took this manually I could hear the diastolic sounds even when the cough was completely deflated and in fact even when it was removed from her arm. This was a phenomenon that I had not seen previous. For this reason I was concerned enough especially with a history of head trauma to contact her cardiologist, Dr. Sophronia Simas, however he is out of the office at this point due to his wife having had a baby. Nonetheless I did end up speaking with the cardiologist on call and his recommendation was more or less an echo of what I was concerned about which was the patient  should likely go to the hospital to have things checked out especially with her history of anticoagulant therapy. 05/14/18 on evaluation today patient's wounds both in regard to the heel and her lower leg appear to be doing better especially in overall appearance. Fortunately there does not appear to be any evidence of infection at this time although unfortunately she tells me she's been having some discomfort over the past week with the dressing changes. We have been using Iodoflex. Overall I do think it's helped improve the quality of the wound although I'm not sure if her discomfort is going to be a limiting factor here. 05/23/2018  Seen today for follow-up and management of left heel and left lower leg wound. Today both wounds have a pretty significant amount of soft on the surface of the wound with surrounding callus. She has expressed in the past of having some discomfort and was hesitant with debridement today. However after explanation and allowing her to express her concerns and address her question she is willing to have the wounds debrided. Her husband has been assisting with the dressing changes. She did mention today that she has chronic back pain and uses OxyContin to help with that pain. No s/s of infection. Denies any recent fevers, increased pain, chills, or shortness of breath during visit today. 06/20/18 on evaluation today patient actually appears to be doing better in regard to her left lower extremity and heel wounds. Currently the patient has been doing very well and overall seems to be progressing quite nicely in my opinion. In fact there is a lot of new skin growth that appears to be doing very well upon evaluation today. Electronic Signature(s) Signed: 06/23/2018 11:30:14 PM By: Worthy Keeler PA-C Entered By: Worthy Keeler on 06/22/2018 06:56:22 Critz, [name] ([id number]) -------------------------------------------------------------------------------- Physical Exam Details Patient Name: [name]. Date of Service: 06/20/2018 3:15 PM Medical Record Number: [id number] Patient Account Number: [id number] Date of Birth/Sex: 1932-08-14 (85 y.o. F) Treating RN: Army Melia Primary Care Provider: Emily Filbert Other Clinician: Referring Provider: Emily Filbert Treating Provider/Extender: Melburn Hake, HOYT Weeks in Treatment: 66 Constitutional Well-nourished and well-hydrated in no acute distress. Respiratory normal breathing without difficulty. clear to auscultation bilaterally. Cardiovascular regular rate and rhythm with normal S1, S2. Psychiatric this patient is able to make decisions and demonstrates  good insight into disease process. Alert and Oriented x 3. pleasant and cooperative. Notes Patient's wound bed currently shows signs of good granulation at this time which is excellent news. With that being said there is also excellent epithelialization at both locations. I'm very pleased with how this seems to be progressing at this point. Electronic Signature(s) Signed: 06/23/2018 11:30:14 PM By: Worthy Keeler PA-C Entered By: Worthy Keeler on 06/22/2018 06:57:34 Shipes, [name] ([id number]) -------------------------------------------------------------------------------- Physician Orders Details Patient Name: [name] Date of Service: 06/20/2018 3:15 PM Medical Record Number: [id number] Patient Account Number: [id number] Date of Birth/Sex: 20-Sep-1932 (85 y.o. F) Treating RN: Army Melia Primary Care Provider: Emily Filbert Other Clinician: Referring Provider: Emily Filbert Treating Provider/Extender: Melburn Hake, HOYT Weeks in Treatment: 1 Verbal / Phone Orders: No Diagnosis Coding ICD-10 Coding Code Description [phone] Unspecified open wound, left lower leg, initial encounter L97.822 Non-pressure chronic ulcer of other part of left lower leg with fat layer exposed L89.626 Pressure-induced deep tissue damage of left heel I73.9 Peripheral vascular disease, unspecified I50.42 Chronic combined systolic (congestive) and diastolic (congestive) heart failure N18.4 Chronic kidney disease, stage 4 (severe) Y69.485 Presence of left  artificial knee joint Wound Cleansing Wound #15 Left,Distal,Anterior Lower Leg o Cleanse wound with mild soap and water Wound #16 Left Calcaneus o Cleanse wound with mild soap and water Primary Wound Dressing Wound #15 Left,Distal,Anterior Lower Leg o Silver Collagen Wound #16 Left Calcaneus o Silver Collagen Secondary Dressing Wound #15 Left,Distal,Anterior Lower Leg o Boardered Foam Dressing Wound #16 Left Calcaneus o Boardered Foam  Dressing Dressing Change Frequency Wound #15 Left,Distal,Anterior Lower Leg o Change dressing every other day. Wound #16 Left Calcaneus o Change dressing every other day. Follow-up Appointments Wound #15 Left,Distal,Anterior Lower Leg o Return Appointment in 3 weeks. VANIAH, CHAMBERS ([id number]) Wound #16 Left Calcaneus o Return Appointment in 3 weeks. Off-Loading Wound #16 Left Calcaneus o Other: - Please continue wearing your boot to protect your heel Electronic Signature(s) Signed: 06/21/2018 3:17:07 PM By: Army Melia Signed: 06/23/2018 11:30:14 PM By: Worthy Keeler PA-C Entered By: Army Melia on 06/20/2018 15:43:20 [name], [name] ([id number]) -------------------------------------------------------------------------------- Problem List Details Patient Name: [name]. Date of Service: 06/20/2018 3:15 PM Medical Record Number: [id number] Patient Account Number: [id number] Date of Birth/Sex: [birth year] (85 y.o. F) Treating RN: Army Melia Primary Care Provider: Emily Filbert Other Clinician: Referring Provider: Emily Filbert Treating Provider/Extender: Melburn Hake, HOYT Weeks in Treatment: 13 Active Problems ICD-10 Evaluated Encounter Code Description Active Date Today Diagnosis S81.802A Unspecified open wound, left lower leg, initial encounter 03/19/2018 No Yes L97.822 Non-pressure chronic ulcer of other part of left lower leg with 03/19/2018 No Yes fat layer exposed L89.626 Pressure-induced deep tissue damage of left heel 03/19/2018 No Yes I73.9 Peripheral vascular disease, unspecified 03/19/2018 No Yes I50.42 Chronic combined systolic (congestive) and diastolic 16/0/1093 No Yes (congestive) heart failure N18.4 Chronic kidney disease, stage 4 (severe) 03/19/2018 No Yes Z96.652 Presence of left artificial knee joint 03/19/2018 No Yes Inactive Problems Resolved Problems Electronic Signature(s) Signed: 06/23/2018 11:30:14 PM By: Worthy Keeler PA-C Entered By: Worthy Keeler  on 06/20/2018 15:11:11 Romito, [name] ([id number]) -------------------------------------------------------------------------------- Progress Note/History and Physical Details Patient Name: [name]. Date of Service: 06/20/2018 3:15 PM Medical Record Number: [id number] Patient Account Number: [id number] Date of Birth/Sex: 1932/12/03 (85 y.o. F) Treating RN: Army Melia Primary Care Provider: Emily Filbert Other Clinician: Referring Provider: Emily Filbert Treating Provider/Extender: Melburn Hake, HOYT Weeks in Treatment: 13 Subjective Chief Complaint Information obtained from Patient Left heel DTI and left LE ulcer History of Present Illness (HPI) 85 year old patient who is known to the wound center for several years now has a new injury to the right lower extremity where she bumped herself against the bath tub. He also has had a chronic right lateral malleolus open wound which has been coming on and off for several years now. This has been there for at least 2014 and was healed out in November 2016. In the past she has been seen by her cardiologist Dr. Kathlyn Sacramento in April 2016. Noninvasive vascular evaluation showed an ABI of 0.68 on the right and 0.85 on the left. 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 One-vessel runoff below the knee via the peroneal artery with short occlusion of the anterior tibial artery and reconstitution. No intervention performed. Most recently she was seen for a wound on her right lower extremity which was healed out by March 15 She is not a smoker and has had no diabetes mellitus. 07/29/2015 -- the appointment with her cardiologist Dr. Fletcher Anon is still pending for review of her arterial  duplex studies. Readmission: 03/19/18 on evaluation today patient presents for readmission concerning an issue she has been having with her left heel and left anterior lower extremity. She has a history of  atrial fibrillation and had knee surgery for a total knee replacement three weeks ago today. Subsequently since being discharged from the hospital it was noted that she did have a left heel deep tissue injury as well the left anterior lower extremity ulcer. This has been present apparently for roughly 2 weeks. She does not appear to have any evidence of infection which is good news. She has been tolerating the dressing changes that have been performed it sounds as if Santyl is what has been used at least most recently. Obviously I think this is likely a good treatment choice for her. Especially in light of the fact that the wound on the distal lower extremity of the left leg appears to show signs of significant slough/eschar buildup which is starting to loosen up now that she is using the central but prior was fairly significant as far as how hard and black it was. She does have pain at both sites the open wound more than the deep tissue injury. Otherwise the patient does have a history of peripheral vascular disease, congestive heart failure, chronic kidney disease stage IV, and now the presence of a left artificial knee joint. 03/28/18 on evaluation today patient appears to be doing a little worse in regard to the dorsal surface of her lower extremity. She has been tolerating the dressing changes without complication although she states there's a lot of pain at this location. Subsequently in regard to her left heel the show signs of continued to drain with maceration noted. I think that some of the dead tissue needs to be removed from the surface of the wound at the site to allow it to heal appropriately. 04/02/18 on evaluation today patient's wounds actually appear to be doing better at this point in regard to the dorsal wound on the anterior lower leg as well as the heel ulcer. She does have an area of thicker eschar of the heel which I think actually needs to be loosened up I think cental or  Iodoflex could be beneficial in this regard. Otherwise debridement will likely be undertaken today over the lower leg region in order to clear away some of the necrotic material. 04/09/18 on evaluation today a lot of the skin which is around the patient's heel ulcer actually is lifting off which is good news. There is some Slough noted on the surface of one centrally this is going to require sharp debridement. The dorsal surface of her foot wound actually appears to be doing much better which is great news. Overall I'm fairly pleased with how things seem to be progressing. The patient likewise is worried about her heel but otherwise feels like things are going fairly well. [name], [name] ([id number]) 04/23/18 on evaluation today patient appears to be doing fairly well in regard to her ulcer. She has been tolerating the dressing changes with the Santyl and overall I do believe this has been of benefit. With that being said she is still having a lot of issues with pain on the dorsal aspect of her foot although I do believe she's slowly making progress which is good news. No fevers, chills, nausea, or vomiting noted at this time. 04/30/18 on evaluation today patient unfortunately appears to be doing a little bit worse I'm concerned about cellulitis around her wounds. Fortunately there  does not appear to be any evidence of infection at this time which is good news. That is systemically. Unfortunately she does have increased pain. 05/07/18 on evaluation today patient appears to be doing decently well in regard to her wounds at this point. With that being said she tells me that she had a fall where she actually struck her head on Saturday roughly 72 hours ago. With that being said she doesn't note any dizziness or confusion but has had a headache. She has continued to take the Keflex which was prescribed for her. She is on Eliquis which obviously is somewhat of a concern. The other issue is that upon evaluation  today she seemed to have a very interesting issue going on with her blood pressure we were able to obtain a systolic blood pressure manually though the diastolic pressure was around 32 with the machine and when I took this manually I could hear the diastolic sounds even when the cough was completely deflated and in fact even when it was removed from her arm. This was a phenomenon that I had not seen previous. For this reason I was concerned enough especially with a history of head trauma to contact her cardiologist, Dr. Sophronia Simas, however he is out of the office at this point due to his wife having had a baby. Nonetheless I did end up speaking with the cardiologist on call and his recommendation was more or less an echo of what I was concerned about which was the patient should likely go to the hospital to have things checked out especially with her history of anticoagulant therapy. 05/14/18 on evaluation today patient's wounds both in regard to the heel and her lower leg appear to be doing better especially in overall appearance. Fortunately there does not appear to be any evidence of infection at this time although unfortunately she tells me she's been having some discomfort over the past week with the dressing changes. We have been using Iodoflex. Overall I do think it's helped improve the quality of the wound although I'm not sure if her discomfort is going to be a limiting factor here. 05/23/2018 Seen today for follow-up and management of left heel and left lower leg wound. Today both wounds have a pretty significant amount of soft on the surface of the wound with surrounding callus. She has expressed in the past of having some discomfort and was hesitant with debridement today. However after explanation and allowing her to express her concerns and address her question she is willing to have the wounds debrided. Her husband has been assisting with the dressing changes. She did mention today that she  has chronic back pain and uses OxyContin to help with that pain. No s/s of infection. Denies any recent fevers, increased pain, chills, or shortness of breath during visit today. 06/20/18 on evaluation today patient actually appears to be doing better in regard to her left lower extremity and heel wounds. Currently the patient has been doing very well and overall seems to be progressing quite nicely in my opinion. In fact there is a lot of new skin growth that appears to be doing very well upon evaluation today. Wound History Patient presents with 1 open wound that has been present for approximately 2 weeks. Laboratory tests have been performed in the last month. Patient reportedly has not tested positive for an antibiotic resistant organism. Patient reportedly has not tested positive for osteomyelitis. Patient reportedly has not had testing performed to evaluate circulation in the legs.  Patient History Information obtained from Patient. Family History Cancer - Siblings, Heart Disease - Father, Hypertension - Siblings, Stroke - Mother, No family history of Hereditary Spherocytosis, Kidney Disease, Lung Disease, Seizures, Thyroid Problems, Tuberculosis. Social History Never smoker, Marital Status - Married, Alcohol Use - Never, Drug Use - No History, Caffeine Use - Daily. Medical History Eyes Patient has history of Cataracts Denies history of Glaucoma, Optic Neuritis Ear/Nose/Mouth/Throat [name], [name] ([id number]) Denies history of Chronic sinus problems/congestion, Middle ear problems Hematologic/Lymphatic Patient has history of Anemia Denies history of Hemophilia, Human Immunodeficiency Virus, Lymphedema, Sickle Cell Disease Respiratory Denies history of Aspiration, Asthma, Chronic Obstructive Pulmonary Disease (COPD), Pneumothorax, Sleep Apnea, Tuberculosis Cardiovascular Patient has history of Arrhythmia - A fib, Hypertension Denies history of Angina, Congestive Heart Failure, Coronary  Artery Disease, Hypotension, Myocardial Infarction, Peripheral Arterial Disease, Peripheral Venous Disease, Phlebitis, Vasculitis Gastrointestinal Denies history of Cirrhosis , Colitis, Crohn s, Hepatitis A, Hepatitis B, Hepatitis C Endocrine Denies history of Type I Diabetes, Type II Diabetes Genitourinary Denies history of End Stage Renal Disease Immunological Denies history of Lupus Erythematosus, Raynaud s, Scleroderma Integumentary (Skin) Denies history of History of Burn, History of pressure wounds Musculoskeletal Patient has history of Osteoarthritis Denies history of Gout, Rheumatoid Arthritis Neurologic Denies history of Dementia, Neuropathy, Quadriplegia, Paraplegia, Seizure Disorder Oncologic Patient has history of Received Radiation Denies history of Received Chemotherapy Psychiatric Denies history of Anorexia/bulimia, Confinement Anxiety Hospitalization/Surgery History - 09/16/2014, [facility], (R) knee replacement. Medical And Surgical History Notes Ear/Nose/Mouth/Throat HOH- hearing aids Genitourinary frequent UTIs Oncologic Breast Ca- R mastectomy Review of Systems (ROS) Constitutional Symptoms (General Health) Denies complaints or symptoms of Fever, Chills. Respiratory The patient has no complaints or symptoms. Cardiovascular The patient has no complaints or symptoms. Psychiatric The patient has no complaints or symptoms. [name], [name] ([id number]) Objective Constitutional Well-nourished and well-hydrated in no acute distress. Vitals Time Taken: 3:15 PM, Height: 68 in, Weight: 121 lbs, BMI: 18.4, Temperature: 98.0 F, Pulse: 72 bpm, Respiratory Rate: 18 breaths/min, Blood Pressure: 145/40 mmHg. Respiratory normal breathing without difficulty. clear to auscultation bilaterally. Cardiovascular regular rate and rhythm with normal S1, S2. Psychiatric this patient is able to make decisions and demonstrates good insight into disease process. Alert and Oriented x 3.  pleasant and cooperative. General Notes: Patient's wound bed currently shows signs of good granulation at this time which is excellent news. With that being said there is also excellent epithelialization at both locations. I'm very pleased with how this seems to be progressing at this point. Integumentary (Hair, Skin) Wound #15 status is Open. Original cause of wound was Shear/Friction. The wound is located on the [facility] Lower Leg. The wound measures 1.5cm length x 1cm width x 0.1cm depth; 1.178cm^2 area and 0.118cm^3 volume. There is Fat Layer (Subcutaneous Tissue) Exposed exposed. There is no tunneling or undermining noted. There is a medium amount of purulent drainage noted. The wound margin is flat and intact. There is small (1-33%) pink granulation within the wound bed. There is a medium (34-66%) amount of necrotic tissue within the wound bed including Adherent Slough. The periwound skin appearance did not exhibit: Callus, Crepitus, Excoriation, Induration, Rash, Scarring, Dry/Scaly, Maceration, Atrophie Blanche, Cyanosis, Ecchymosis, Hemosiderin Staining, Mottled, Pallor, Rubor, Erythema. Periwound temperature was noted as No Abnormality. The periwound has tenderness on palpation. Wound #16 status is Open. Original cause of wound was Pressure Injury. The wound is located on the Left Calcaneus. The wound measures 0.2cm length x 0.3cm width x 0.1cm depth; 0.047cm^2 area and  0.005cm^3 volume. There is Fat Layer (Subcutaneous Tissue) Exposed exposed. There is no tunneling or undermining noted. There is a medium amount of serous drainage noted. The wound margin is indistinct and nonvisible. There is medium (34-66%) pale granulation within the wound bed. There is a small (1-33%) amount of necrotic tissue within the wound bed including Adherent Slough. The periwound skin appearance exhibited: Dry/Scaly. The periwound skin appearance did not exhibit: Callus, Crepitus, Excoriation,  Induration, Rash, Scarring, Maceration, Atrophie Blanche, Cyanosis, Ecchymosis, Hemosiderin Staining, Mottled, Pallor, Rubor, Erythema. Periwound temperature was noted as No Abnormality. The periwound has tenderness on palpation. Assessment Active Problems ICD-10 Unspecified open wound, left lower leg, initial encounter Non-pressure chronic ulcer of other part of left lower leg with fat layer exposed Pressure-induced deep tissue damage of left heel Peripheral vascular disease, unspecified Chronic combined systolic (congestive) and diastolic (congestive) heart failure Chronic kidney disease, stage 4 (severe) Presence of left artificial knee joint [name], [name] Kitchen ([id number]) Plan Wound Cleansing: Wound #15 Left,Distal,Anterior Lower Leg: Cleanse wound with mild soap and water Wound #16 Left Calcaneus: Cleanse wound with mild soap and water Primary Wound Dressing: Wound #15 Left,Distal,Anterior Lower Leg: Silver Collagen Wound #16 Left Calcaneus: Silver Collagen Secondary Dressing: Wound #15 Left,Distal,Anterior Lower Leg: Boardered Foam Dressing Wound #16 Left Calcaneus: Boardered Foam Dressing Dressing Change Frequency: Wound #15 Left,Distal,Anterior Lower Leg: Change dressing every other day. Wound #16 Left Calcaneus: Change dressing every other day. Follow-up Appointments: Wound #15 Left,Distal,Anterior Lower Leg: Return Appointment in 3 weeks. Wound #16 Left Calcaneus: Return Appointment in 3 weeks. Off-Loading: Wound #16 Left Calcaneus: Other: - Please continue wearing your boot to protect your heel I'm gonna recommend that we go ahead and continue with the above wound care measures for the next week and the patient is in agreement with plan. Subsequently will see were things stand at follow-up. I am gonna suggest we do add collagen to the wound bed and then continue with the hydrogel that she's been using up to this point I think this will speed up the healing process  for her. Patient again is in agreement that plan. Please see above for specific wound care orders. We will see patient for re-evaluation in 3 week(s) here in the clinic. If anything worsens or changes patient will contact our office for additional recommendations. Electronic Signature(s) Signed: 06/23/2018 11:30:14 PM By: Worthy Keeler PA-C Entered By: Worthy Keeler on 06/22/2018 06:57:54 [name], [name] ([id number]) -------------------------------------------------------------------------------- ROS/PFSH Details Patient Name: [name]. Date of Service: 06/20/2018 3:15 PM Medical Record Number: [id number] Patient Account Number: [id number] Date of Birth/Sex: 1932-09-18 (85 y.o. F) Treating RN: Army Melia Primary Care Provider: Emily Filbert Other Clinician: Referring Provider: Emily Filbert Treating Provider/Extender: Melburn Hake, HOYT Weeks in Treatment: 13 Label Progress Note Print Version as History and Physical for this encounter Information Obtained From Patient Wound History Do you currently have one or more open woundso Yes How many open wounds do you currently haveo 1 Approximately how long have you had your woundso 2 weeks Have you had any lab work done in the past montho Yes Have you tested positive for osteomyelitis (bone infection)o No Have you had any tests for circulation on your legso No Constitutional Symptoms (General Health) Complaints and Symptoms: Negative for: Fever; Chills Eyes Medical History: Positive for: Cataracts Negative for: Glaucoma; Optic Neuritis Ear/Nose/Mouth/Throat Medical History: Negative for: Chronic sinus problems/congestion; Middle ear problems Past Medical History Notes: HOH- hearing aids Hematologic/Lymphatic Medical History: Positive for: Anemia Negative for:  Hemophilia; Human Immunodeficiency Virus; Lymphedema; Sickle Cell Disease Respiratory Complaints and Symptoms: No Complaints or Symptoms Medical History: Negative for:  Aspiration; Asthma; Chronic Obstructive Pulmonary Disease (COPD); Pneumothorax; Sleep Apnea; Tuberculosis Cardiovascular Complaints and Symptoms: No Complaints or Symptoms Hejl, DEMARIS BOUSQUET. ([id number]) Medical History: Positive for: Arrhythmia - A fib; Hypertension Negative for: Angina; Congestive Heart Failure; Coronary Artery Disease; Hypotension; Myocardial Infarction; Peripheral Arterial Disease; Peripheral Venous Disease; Phlebitis; Vasculitis Gastrointestinal Medical History: Negative for: Cirrhosis ; Colitis; Crohnos; Hepatitis A; Hepatitis B; Hepatitis C Endocrine Medical History: Negative for: Type I Diabetes; Type II Diabetes Genitourinary Medical History: Negative for: End Stage Renal Disease Past Medical History Notes: frequent UTIs Immunological Medical History: Negative for: Lupus Erythematosus; Raynaudos; Scleroderma Integumentary (Skin) Medical History: Negative for: History of Burn; History of pressure wounds Musculoskeletal Medical History: Positive for: Osteoarthritis Negative for: Gout; Rheumatoid Arthritis Neurologic Medical History: Negative for: Dementia; Neuropathy; Quadriplegia; Paraplegia; Seizure Disorder Oncologic Medical History: Positive for: Received Radiation Negative for: Received Chemotherapy Past Medical History Notes: Breast Ca- R mastectomy Psychiatric Complaints and Symptoms: No Complaints or Symptoms Medical History: Negative for: Anorexia/bulimia; Confinement Anxiety [name], Latoya A. ([id number]) HBO Extended History Items Eyes: Cataracts Immunizations Pneumococcal Vaccine: Received Pneumococcal Vaccination: No Tetanus Vaccine: Last tetanus shot: 08/15/2008 Implantable Devices No devices added Hospitalization / Surgery History Name of Hospital Purpose of Hospitalization/Surgery Date [facility] (R) knee replacement 09/16/2014 Family and Social History Cancer: Yes - Siblings; Heart Disease: Yes - Father; Hereditary Spherocytosis: No;  Hypertension: Yes - Siblings; Kidney Disease: No; Lung Disease: No; Seizures: No; Stroke: Yes - Mother; Thyroid Problems: No; Tuberculosis: No; Never smoker; Marital Status - Married; Alcohol Use: Never; Drug Use: No History; Caffeine Use: Daily; Financial Concerns: No; Food, Clothing or Shelter Needs: No; Support System Lacking: No; Transportation Concerns: No; Advanced Directives: Yes (Not Provided); Patient does not want information on Advanced Directives; Do not resuscitate: No; Living Will: Yes (Not Provided); Medical Power of Attorney: Yes (Not Provided) Physician Affirmation I have reviewed and agree with the above information. Electronic Signature(s) Signed: 06/23/2018 11:30:14 PM By: Worthy Keeler PA-C Signed: 06/24/2018 8:21:26 AM By: Army Melia Entered By: Worthy Keeler on 06/22/2018 06:57:15 [name], [name] ([id number]) -------------------------------------------------------------------------------- SuperBill Details Patient Name: [name]. Date of Service: 06/20/2018 Medical Record Number: [id number] Patient Account Number: [id number] Date of Birth/Sex: Aug 18, 1932 (85 y.o. F) Treating RN: Army Melia Primary Care Provider: Emily Filbert Other Clinician: Referring Provider: Emily Filbert Treating Provider/Extender: Melburn Hake, HOYT Weeks in Treatment: 13 Diagnosis Coding ICD-10 Codes Code Description [phone] Unspecified open wound, left lower leg, initial encounter L97.822 Non-pressure chronic ulcer of other part of left lower leg with fat layer exposed L89.626 Pressure-induced deep tissue damage of left heel I73.9 Peripheral vascular disease, unspecified I50.42 Chronic combined systolic (congestive) and diastolic (congestive) heart failure N18.4 Chronic kidney disease, stage 4 (severe) E83.151 Presence of left artificial knee joint Facility Procedures CPT4 Code: [id number] Description: 99213 - WOUND CARE VISIT-LEV 3 EST PT Modifier: Quantity: 1 Physician Procedures CPT4  Code Description: [id number] 99214 - WC PHYS LEVEL 4 - EST PT ICD-10 Diagnosis Description S81.802A Unspecified open wound, left lower leg, initial encounter L97.822 Non-pressure chronic ulcer of other part of left lower leg wit L89.626  Pressure-induced deep tissue damage of left heel I73.9 Peripheral vascular disease, unspecified Modifier: h fat layer expos Quantity: 1 ed Electronic Signature(s) Signed: 06/23/2018 11:30:14 PM By: Worthy Keeler PA-C Entered By: Worthy Keeler on 06/22/2018 06:58:19

## 2018-06-24 NOTE — Progress Notes (Signed)
[name], [name] ([id number]) Visit Report for 05/23/2018 Chief Complaint Document Details Patient Name: [name], [name]. Date of Service: 05/23/2018 1:45 PM Medical Record Number: [id number] Patient Account Number: [id number] Date of Birth/Sex: [birth year] (85 y.o. F) Treating RN: Harold Barban Primary Care Provider: Emily Filbert Other Clinician: Referring Provider: Emily Filbert Treating Provider/Extender: Beather Arbour Weeks in Treatment: 9 Information Obtained from: Patient Chief Complaint Left heel DTI and left LE ulcer Electronic Signature(s) Signed: 05/25/2018 6:43:56 PM By: Beather Arbour FNP-C Entered By: Beather Arbour on 05/23/2018 16:03:17 [name], [name] ([id number]) -------------------------------------------------------------------------------- Debridement Details Patient Name: [name]. Date of Service: 05/23/2018 1:45 PM Medical Record Number: [id number] Patient Account Number: [id number] Date of Birth/Sex: 11/06/1932 (85 y.o. F) Treating RN: Harold Barban Primary Care Provider: Emily Filbert Other Clinician: Referring Provider: Emily Filbert Treating Provider/Extender: Beather Arbour Weeks in Treatment: 9 Debridement Performed for Wound #15 Left,Distal,Anterior Lower Leg Assessment: Performed By: Physician Beather Arbour, FNP Debridement Type: Debridement Level of Consciousness (Pre- Awake and Alert procedure): Pre-procedure Verification/Time Yes - 14:39 Out Taken: Start Time: 14:39 Pain Control: Lidocaine Total Area Debrided (L x W): 2.6 (cm) x 2.9 (cm) = 7.54 (cm) Tissue and other material Non-Viable, Slough, Slough debrided: Level: Non-Viable Tissue Debridement Description: Selective/Open Wound Instrument: Curette Bleeding: Minimum Hemostasis Achieved: Pressure End Time: 14:50 Procedural Pain: 0 Post Procedural Pain: 0 Response to Treatment: Procedure was tolerated well Level of Consciousness Awake and Alert (Post-procedure): Post Debridement  Measurements of Total Wound Length: (cm) 2.6 Width: (cm) 2.9 Depth: (cm) 0.1 Volume: (cm) 0.592 Character of Wound/Ulcer Post Debridement: Improved Post Procedure Diagnosis Same as Pre-procedure Electronic Signature(s) Signed: 05/25/2018 6:43:56 PM By: Beather Arbour FNP-C Signed: 06/24/2018 10:54:06 AM By: Harold Barban Entered By: Harold Barban on 05/23/2018 14:43:24 [name], [name] ([id number]) -------------------------------------------------------------------------------- Debridement Details Patient Name: [name]. Date of Service: 05/23/2018 1:45 PM Medical Record Number: [id number] Patient Account Number: [id number] Date of Birth/Sex: [birth year] (85 y.o. F) Treating RN: Harold Barban Primary Care Provider: Emily Filbert Other Clinician: Referring Provider: Emily Filbert Treating Provider/Extender: Beather Arbour Weeks in Treatment: 9 Debridement Performed for Wound #16 Left Calcaneus Assessment: Performed By: Physician Beather Arbour, FNP Debridement Type: Debridement Level of Consciousness (Pre- Awake and Alert procedure): Pre-procedure Verification/Time Yes - 14:39 Out Taken: Start Time: 14:39 Pain Control: Lidocaine Total Area Debrided (L x W): 1.2 (cm) x 1.6 (cm) = 1.92 (cm) Tissue and other material Non-Viable, Callus, Slough, Slough debrided: Level: Non-Viable Tissue Debridement Description: Selective/Open Wound Instrument: Curette Bleeding: Minimum Hemostasis Achieved: Pressure End Time: 14:58 Procedural Pain: 0 Post Procedural Pain: 0 Response to Treatment: Procedure was tolerated well Level of Consciousness Awake and Alert (Post-procedure): Post Debridement Measurements of Total Wound Length: (cm) 1.2 Stage: Category/Stage III Width: (cm) 1.6 Depth: (cm) 0.1 Volume: (cm) 0.151 Character of Wound/Ulcer Post Improved Debridement: Post Procedure Diagnosis Same as Pre-procedure Electronic Signature(s) Signed: 05/25/2018 6:43:56 PM By:  Beather Arbour FNP-C Signed: 06/24/2018 10:54:06 AM By: Harold Barban Entered By: Harold Barban on 05/23/2018 14:54:36 [name], [name] ([id number]) -------------------------------------------------------------------------------- HPI Details Patient Name: [name]. Date of Service: 05/23/2018 1:45 PM Medical Record Number: [id number] Patient Account Number: [id number] Date of Birth/Sex: 01/28/1933 (85 y.o. F) Treating RN: Harold Barban Primary Care Provider: Emily Filbert Other Clinician: Referring Provider: Emily Filbert Treating Provider/Extender: Oneida Arenas in Treatment: 9 History of Present Illness HPI Description: 85 year old patient who is known to the wound center for several years now has a new injury to the right lower extremity where she  bumped herself against the bath tub. He also has had a chronic right lateral malleolus open wound which has been coming on and off for several years now. This has been there for at least 2014 and was healed out in November 2016. In the past she has been seen by her cardiologist Dr. Kathlyn Sacramento in April 2016. Noninvasive vascular evaluation showed an ABI of 0.68 on the right and 0.85 on the left. 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 One-vessel runoff below the knee via the peroneal artery with short occlusion of the anterior tibial artery and reconstitution. No intervention performed. Most recently she was seen for a wound on her right lower extremity which was healed out by March 15 She is not a smoker and has had no diabetes mellitus. 07/29/2015 -- the appointment with her cardiologist Dr. Fletcher Anon is still pending for review of her arterial duplex studies. Readmission: 03/19/18 on evaluation today patient presents for readmission concerning an issue she has been having with her left heel and left anterior lower extremity. She has a history of atrial  fibrillation and had knee surgery for a total knee replacement three weeks ago today. Subsequently since being discharged from the hospital it was noted that she did have a left heel deep tissue injury as well the left anterior lower extremity ulcer. This has been present apparently for roughly 2 weeks. She does not appear to have any evidence of infection which is good news. She has been tolerating the dressing changes that have been performed it sounds as if Santyl is what has been used at least most recently. Obviously I think this is likely a good treatment choice for her. Especially in light of the fact that the wound on the distal lower extremity of the left leg appears to show signs of significant slough/eschar buildup which is starting to loosen up now that she is using the central but prior was fairly significant as far as how hard and black it was. She does have pain at both sites the open wound more than the deep tissue injury. Otherwise the patient does have a history of peripheral vascular disease, congestive heart failure, chronic kidney disease stage IV, and now the presence of a left artificial knee joint. 03/28/18 on evaluation today patient appears to be doing a little worse in regard to the dorsal surface of her lower extremity. She has been tolerating the dressing changes without complication although she states there's a lot of pain at this location. Subsequently in regard to her left heel the show signs of continued to drain with maceration noted. I think that some of the dead tissue needs to be removed from the surface of the wound at the site to allow it to heal appropriately. 04/02/18 on evaluation today patient's wounds actually appear to be doing better at this point in regard to the dorsal wound on the anterior lower leg as well as the heel ulcer. She does have an area of thicker eschar of the heel which I think actually needs to be loosened up I think cental or Iodoflex  could be beneficial in this regard. Otherwise debridement will likely be undertaken today over the lower leg region in order to clear away some of the necrotic material. 04/09/18 on evaluation today a lot of the skin which is around the patient's heel ulcer actually is lifting off which is good news. There is some Slough noted on the surface of  one centrally this is going to require sharp debridement. The dorsal surface of her foot wound actually appears to be doing much better which is great news. Overall I'm fairly pleased with how things seem to be progressing. The patient likewise is worried about her heel but otherwise feels like things are going fairly well. 04/23/18 on evaluation today patient appears to be doing fairly well in regard to her ulcer. She has been tolerating the dressing changes with the Santyl and overall I do believe this has been of benefit. With that being said she is still having a lot of issues with pain on the dorsal aspect of her foot although I do believe she's slowly making progress which is good news. No fevers, chills, nausea, or vomiting noted at this time. [name], [name] ([id number]) 04/30/18 on evaluation today patient unfortunately appears to be doing a little bit worse I'm concerned about cellulitis around her wounds. Fortunately there does not appear to be any evidence of infection at this time which is good news. That is systemically. Unfortunately she does have increased pain. 05/07/18 on evaluation today patient appears to be doing decently well in regard to her wounds at this point. With that being said she tells me that she had a fall where she actually struck her head on Saturday roughly 72 hours ago. With that being said she doesn't note any dizziness or confusion but has had a headache. She has continued to take the Keflex which was prescribed for her. She is on Eliquis which obviously is somewhat of a concern. The other issue is that upon evaluation today she  seemed to have a very interesting issue going on with her blood pressure we were able to obtain a systolic blood pressure manually though the diastolic pressure was around 32 with the machine and when I took this manually I could hear the diastolic sounds even when the cough was completely deflated and in fact even when it was removed from her arm. This was a phenomenon that I had not seen previous. For this reason I was concerned enough especially with a history of head trauma to contact her cardiologist, Dr. Sophronia Simas, however he is out of the office at this point due to his wife having had a baby. Nonetheless I did end up speaking with the cardiologist on call and his recommendation was more or less an echo of what I was concerned about which was the patient should likely go to the hospital to have things checked out especially with her history of anticoagulant therapy. 05/14/18 on evaluation today patient's wounds both in regard to the heel and her lower leg appear to be doing better especially in overall appearance. Fortunately there does not appear to be any evidence of infection at this time although unfortunately she tells me she's been having some discomfort over the past week with the dressing changes. We have been using Iodoflex. Overall I do think it's helped improve the quality of the wound although I'm not sure if her discomfort is going to be a limiting factor here. 05/23/2018 Seen today for follow-up and management of left heel and left lower leg wound. Today both wounds have a pretty significant amount of soft on the surface of the wound with surrounding callus. She has expressed in the past of having some discomfort and was hesitant with debridement today. However after explanation and allowing her to express her concerns and address her question she is willing to have the wounds debrided. Her husband  has been assisting with the dressing changes. She did mention today that she has chronic  back pain and uses OxyContin to help with that pain. No s/s of infection. Denies any recent fevers, increased pain, chills, or shortness of breath during visit today. Electronic Signature(s) Signed: 05/25/2018 6:43:56 PM By: Beather Arbour FNP-C Entered By: Beather Arbour on 05/23/2018 15:06:06 [name], [name] ([id number]) -------------------------------------------------------------------------------- Physician Orders Details Patient Name: [name]. Date of Service: 05/23/2018 1:45 PM Medical Record Number: [id number] Patient Account Number: [id number] Date of Birth/Sex: 1932/11/08 (85 y.o. F) Treating RN: Harold Barban Primary Care Provider: Emily Filbert Other Clinician: Referring Provider: Emily Filbert Treating Provider/Extender: Oneida Arenas in Treatment: 9 Verbal / Phone Orders: No Diagnosis Coding Wound Cleansing Wound #15 Left,Distal,Anterior Lower Leg o Cleanse wound with mild soap and water Wound #16 Left Calcaneus o Cleanse wound with mild soap and water Primary Wound Dressing Wound #15 Left,Distal,Anterior Lower Leg o Hydrafera Blue Ready Transfer Wound #16 Left Calcaneus o Hydrafera Blue Ready Transfer Secondary Dressing Wound #15 Left,Distal,Anterior Lower Leg o Gauze, ABD and Kerlix/Conform - secure with netting Wound #16 Left Calcaneus o Gauze, ABD and Kerlix/Conform - secure with netting Dressing Change Frequency Wound #15 Left,Distal,Anterior Lower Leg o Change dressing every other day. Wound #16 Left Calcaneus o Change dressing every other day. Follow-up Appointments Wound #15 Left,Distal,Anterior Lower Leg o Return Appointment in 1 week. Wound #16 Left Calcaneus o Return Appointment in 1 week. Off-Loading Wound #16 Left Calcaneus o Other: - Please continue wearing your boot to protect your heel Electronic Signature(s) Signed: 05/25/2018 6:43:56 PM By: Beather Arbour FNP-C [name], [name] ([id number]) Signed: 06/24/2018 10:54:06  AM By: Harold Barban Entered By: Harold Barban on 05/23/2018 15:04:42 Sens, [name] ([id number]) -------------------------------------------------------------------------------- Problem List Details Patient Name: [name]. Date of Service: 05/23/2018 1:45 PM Medical Record Number: [id number] Patient Account Number: [id number] Date of Birth/Sex: 03/06/1933 (85 y.o. F) Treating RN: Harold Barban Primary Care Provider: Emily Filbert Other Clinician: Referring Provider: Emily Filbert Treating Provider/Extender: Beather Arbour Weeks in Treatment: 9 Active Problems ICD-10 Evaluated Encounter Code Description Active Date Today Diagnosis S81.802A Unspecified open wound, left lower leg, initial encounter 03/19/2018 No Yes L97.822 Non-pressure chronic ulcer of other part of left lower leg with 03/19/2018 No Yes fat layer exposed L89.626 Pressure-induced deep tissue damage of left heel 03/19/2018 No Yes I73.9 Peripheral vascular disease, unspecified 03/19/2018 No Yes I50.42 Chronic combined systolic (congestive) and diastolic 80/12/9831 No Yes (congestive) heart failure N18.4 Chronic kidney disease, stage 4 (severe) 03/19/2018 No Yes Z96.652 Presence of left artificial knee joint 03/19/2018 No Yes Inactive Problems Resolved Problems Electronic Signature(s) Signed: 05/25/2018 6:43:56 PM By: Beather Arbour FNP-C Entered By: Beather Arbour on 05/23/2018 14:59:04 [name], [name] ([id number]) -------------------------------------------------------------------------------- Progress Note/History and Physical Details Patient Name: [name]. Date of Service: 05/23/2018 1:45 PM Medical Record Number: [id number] Patient Account Number: [id number] Date of Birth/Sex: 1932/09/25 (85 y.o. F) Treating RN: Harold Barban Primary Care Provider: Emily Filbert Other Clinician: Referring Provider: Emily Filbert Treating Provider/Extender: Oneida Arenas in Treatment: 9 Subjective Chief Complaint Information  obtained from Patient Left heel DTI and left LE ulcer History of Present Illness (HPI) 85 year old patient who is known to the wound center for several years now has a new injury to the right lower extremity where she bumped herself against the bath tub. He also has had a chronic right lateral malleolus open wound which has been coming on and off for several years now. This has  been there for at least 2014 and was healed out in November 2016. In the past she has been seen by her cardiologist Dr. Kathlyn Sacramento in April 2016. Noninvasive vascular evaluation showed an ABI of 0.68 on the right and 0.85 on the left. 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 One-vessel runoff below the knee via the peroneal artery with short occlusion of the anterior tibial artery and reconstitution. No intervention performed. Most recently she was seen for a wound on her right lower extremity which was healed out by March 15 She is not a smoker and has had no diabetes mellitus. 07/29/2015 -- the appointment with her cardiologist Dr. Fletcher Anon is still pending for review of her arterial duplex studies. Readmission: 03/19/18 on evaluation today patient presents for readmission concerning an issue she has been having with her left heel and left anterior lower extremity. She has a history of atrial fibrillation and had knee surgery for a total knee replacement three weeks ago today. Subsequently since being discharged from the hospital it was noted that she did have a left heel deep tissue injury as well the left anterior lower extremity ulcer. This has been present apparently for roughly 2 weeks. She does not appear to have any evidence of infection which is good news. She has been tolerating the dressing changes that have been performed it sounds as if Santyl is what has been used at least most recently. Obviously I think this is likely a  good treatment choice for her. Especially in light of the fact that the wound on the distal lower extremity of the left leg appears to show signs of significant slough/eschar buildup which is starting to loosen up now that she is using the central but prior was fairly significant as far as how hard and black it was. She does have pain at both sites the open wound more than the deep tissue injury. Otherwise the patient does have a history of peripheral vascular disease, congestive heart failure, chronic kidney disease stage IV, and now the presence of a left artificial knee joint. 03/28/18 on evaluation today patient appears to be doing a little worse in regard to the dorsal surface of her lower extremity. She has been tolerating the dressing changes without complication although she states there's a lot of pain at this location. Subsequently in regard to her left heel the show signs of continued to drain with maceration noted. I think that some of the dead tissue needs to be removed from the surface of the wound at the site to allow it to heal appropriately. 04/02/18 on evaluation today patient's wounds actually appear to be doing better at this point in regard to the dorsal wound on the anterior lower leg as well as the heel ulcer. She does have an area of thicker eschar of the heel which I think actually needs to be loosened up I think cental or Iodoflex could be beneficial in this regard. Otherwise debridement will likely be undertaken today over the lower leg region in order to clear away some of the necrotic material. 04/09/18 on evaluation today a lot of the skin which is around the patient's heel ulcer actually is lifting off which is good news. There is some Slough noted on the surface of one centrally this is going to require sharp debridement. The dorsal surface of her foot wound actually appears to be doing much better which is great news. Overall I'm fairly pleased  with how things  seem to be progressing. The patient likewise is worried about her heel but otherwise feels like things are going fairly well. [name], [name] ([id number]) 04/23/18 on evaluation today patient appears to be doing fairly well in regard to her ulcer. She has been tolerating the dressing changes with the Santyl and overall I do believe this has been of benefit. With that being said she is still having a lot of issues with pain on the dorsal aspect of her foot although I do believe she's slowly making progress which is good news. No fevers, chills, nausea, or vomiting noted at this time. 04/30/18 on evaluation today patient unfortunately appears to be doing a little bit worse I'm concerned about cellulitis around her wounds. Fortunately there does not appear to be any evidence of infection at this time which is good news. That is systemically. Unfortunately she does have increased pain. 05/07/18 on evaluation today patient appears to be doing decently well in regard to her wounds at this point. With that being said she tells me that she had a fall where she actually struck her head on Saturday roughly 72 hours ago. With that being said she doesn't note any dizziness or confusion but has had a headache. She has continued to take the Keflex which was prescribed for her. She is on Eliquis which obviously is somewhat of a concern. The other issue is that upon evaluation today she seemed to have a very interesting issue going on with her blood pressure we were able to obtain a systolic blood pressure manually though the diastolic pressure was around 32 with the machine and when I took this manually I could hear the diastolic sounds even when the cough was completely deflated and in fact even when it was removed from her arm. This was a phenomenon that I had not seen previous. For this reason I was concerned enough especially with a history of head trauma to contact her cardiologist, Dr. Sophronia Simas, however he is out of  the office at this point due to his wife having had a baby. Nonetheless I did end up speaking with the cardiologist on call and his recommendation was more or less an echo of what I was concerned about which was the patient should likely go to the hospital to have things checked out especially with her history of anticoagulant therapy. 05/14/18 on evaluation today patient's wounds both in regard to the heel and her lower leg appear to be doing better especially in overall appearance. Fortunately there does not appear to be any evidence of infection at this time although unfortunately she tells me she's been having some discomfort over the past week with the dressing changes. We have been using Iodoflex. Overall I do think it's helped improve the quality of the wound although I'm not sure if her discomfort is going to be a limiting factor here. 05/23/2018 Seen today for follow-up and management of left heel and left lower leg wound. Today both wounds have a pretty significant amount of soft on the surface of the wound with surrounding callus. She has expressed in the past of having some discomfort and was hesitant with debridement today. However after explanation and allowing her to express her concerns and address her question she is willing to have the wounds debrided. Her husband has been assisting with the dressing changes. She did mention today that she has chronic back pain and uses OxyContin to help with that pain. No s/s of infection. Denies  any recent fevers, increased pain, chills, or shortness of breath during visit today. Wound History Patient presents with 1 open wound that has been present for approximately 2 weeks. Laboratory tests have been performed in the last month. Patient reportedly has tested positive for an antibiotic resistant organism. Patient reportedly has not tested positive for osteomyelitis. Patient reportedly has not had testing performed to evaluate circulation in the  legs. Patient History Information obtained from Patient. Family History Cancer - Siblings, Heart Disease - Father, Hypertension - Siblings, Stroke - Mother, No family history of Hereditary Spherocytosis, Kidney Disease, Lung Disease, Seizures, Thyroid Problems, Tuberculosis. Social History Never smoker, Marital Status - Married, Alcohol Use - Never, Drug Use - No History, Caffeine Use - Daily. Medical History Eyes Patient has history of Cataracts Denies history of Glaucoma, Optic Neuritis Ear/Nose/Mouth/Throat Denies history of Chronic sinus problems/congestion, Middle ear problems Hematologic/Lymphatic Patient has history of Anemia Denies history of Hemophilia, Human Immunodeficiency Virus, Lymphedema, Sickle Cell Disease Poser, Izzabella A. ([id number]) Respiratory Denies history of Aspiration, Asthma, Chronic Obstructive Pulmonary Disease (COPD), Pneumothorax, Sleep Apnea, Tuberculosis Cardiovascular Patient has history of Arrhythmia - A fib, Hypertension Denies history of Angina, Congestive Heart Failure, Coronary Artery Disease, Hypotension, Myocardial Infarction, Peripheral Arterial Disease, Peripheral Venous Disease, Phlebitis, Vasculitis Gastrointestinal Denies history of Cirrhosis , Colitis, Crohn s, Hepatitis A, Hepatitis B, Hepatitis C Endocrine Denies history of Type I Diabetes, Type II Diabetes Genitourinary Denies history of End Stage Renal Disease Immunological Denies history of Lupus Erythematosus, Raynaud s, Scleroderma Integumentary (Skin) Denies history of History of Burn, History of pressure wounds Musculoskeletal Patient has history of Osteoarthritis Denies history of Gout, Rheumatoid Arthritis Neurologic Denies history of Dementia, Neuropathy, Quadriplegia, Paraplegia, Seizure Disorder Oncologic Patient has history of Received Radiation Denies history of Received Chemotherapy Psychiatric Denies history of Anorexia/bulimia, Confinement  Anxiety Hospitalization/Surgery History - 09/16/2014, [facility], (R) knee replacement. Medical And Surgical History Notes Ear/Nose/Mouth/Throat HOH- hearing aids Genitourinary frequent UTIs Oncologic Breast Ca- R mastectomy Review of Systems (ROS) Constitutional Symptoms (General Health) The patient has no complaints or symptoms. Respiratory The patient has no complaints or symptoms. Cardiovascular The patient has no complaints or symptoms. Gastrointestinal The patient has no complaints or symptoms. Integumentary (Skin) Complains or has symptoms of Wounds - Left LE. Objective [name], [name] THAMMAVONG ([id number]) Constitutional Vitals Time Taken: 1:52 PM, Height: 68 in, Weight: 121 lbs, BMI: 18.4, Temperature: 98.0 F, Pulse: 63 bpm, Respiratory Rate: 16 breaths/min, Blood Pressure: 156/42 mmHg. Integumentary (Hair, Skin) Wound #15 status is Open. Original cause of wound was Shear/Friction. The wound is located on the [facility] Lower Leg. The wound measures 2.6cm length x 2.9cm width x 0.1cm depth; 5.922cm^2 area and 0.592cm^3 volume. There is Fat Layer (Subcutaneous Tissue) Exposed exposed. There is no tunneling or undermining noted. There is a medium amount of purulent drainage noted. The wound margin is flat and intact. There is small (1-33%) pink granulation within the wound bed. There is a large (67-100%) amount of necrotic tissue within the wound bed including Adherent Slough. The periwound skin appearance exhibited: Erythema. The periwound skin appearance did not exhibit: Callus, Crepitus, Excoriation, Induration, Rash, Scarring, Dry/Scaly, Maceration, Atrophie Blanche, Cyanosis, Ecchymosis, Hemosiderin Staining, Mottled, Pallor, Rubor. The surrounding wound skin color is noted with erythema which is circumferential. Periwound temperature was noted as No Abnormality. The periwound has tenderness on palpation. Wound #16 status is Open. Original cause of wound was Pressure Injury. The  wound is located on the Left Calcaneus. The wound measures 1.2cm length x 1.6cm  width x 0.1cm depth; 1.508cm^2 area and 0.151cm^3 volume. There is Fat Layer (Subcutaneous Tissue) Exposed exposed. There is no tunneling or undermining noted. There is a medium amount of purulent drainage noted. The wound margin is indistinct and nonvisible. There is medium (34-66%) pale granulation within the wound bed. There is a medium (34-66%) amount of necrotic tissue within the wound bed including Adherent Slough. The periwound skin appearance did not exhibit: Callus, Crepitus, Excoriation, Induration, Rash, Scarring, Dry/Scaly, Maceration, Atrophie Blanche, Cyanosis, Ecchymosis, Hemosiderin Staining, Mottled, Pallor, Rubor, Erythema. Periwound temperature was noted as No Abnormality. The periwound has tenderness on palpation. Assessment Active Problems ICD-10 Unspecified open wound, left lower leg, initial encounter Non-pressure chronic ulcer of other part of left lower leg with fat layer exposed Pressure-induced deep tissue damage of left heel Peripheral vascular disease, unspecified Chronic combined systolic (congestive) and diastolic (congestive) heart failure Chronic kidney disease, stage 4 (severe) Presence of left artificial knee joint Procedures Wound #15 Pre-procedure diagnosis of Wound #15 is a Skin Tear located on the Left,Distal,Anterior Lower Leg . There was a Selective/Open Wound Non-Viable Tissue Debridement with a total area of 7.54 sq cm performed by Beather Arbour, FNP. With the following instrument(s): Curette to remove Non-Viable tissue/material. Material removed includes [facility] after achieving pain control using Lidocaine. No specimens were taken. A time out was conducted at 14:39, prior to the start of the procedure. A Minimum amount of bleeding was controlled with Pressure. The procedure was tolerated well with a pain level of 0 throughout and a pain level of 0 following the  procedure. Post Debridement Measurements: 2.6cm length x 2.9cm width x 0.1cm depth; 0.592cm^3 volume. Character of Wound/Ulcer Post Debridement is improved. [name], [name] ([id number]) Post procedure Diagnosis Wound #15: Same as Pre-Procedure Wound #16 Pre-procedure diagnosis of Wound #16 is a Pressure Ulcer located on the Left Calcaneus . There was a Selective/Open Wound Non-Viable Tissue Debridement with a total area of 1.92 sq cm performed by Beather Arbour, FNP. With the following instrument(s): Curette to remove Non-Viable tissue/material. Material removed includes Callus and Slough and after achieving pain control using Lidocaine. No specimens were taken. A time out was conducted at 14:39, prior to the start of the procedure. A Minimum amount of bleeding was controlled with Pressure. The procedure was tolerated well with a pain level of 0 throughout and a pain level of 0 following the procedure. Post Debridement Measurements: 1.2cm length x 1.6cm width x 0.1cm depth; 0.151cm^3 volume. Post debridement Stage noted as Category/Stage III. Character of Wound/Ulcer Post Debridement is improved. Post procedure Diagnosis Wound #16: Same as Pre-Procedure Plan Wound Cleansing: Wound #15 Left,Distal,Anterior Lower Leg: Cleanse wound with mild soap and water Wound #16 Left Calcaneus: Cleanse wound with mild soap and water Primary Wound Dressing: Wound #15 Left,Distal,Anterior Lower Leg: Hydrafera Blue Ready Transfer Wound #16 Left Calcaneus: Hydrafera Blue Ready Transfer Secondary Dressing: Wound #15 Left,Distal,Anterior Lower Leg: Gauze, ABD and Kerlix/Conform - secure with netting Wound #16 Left Calcaneus: Gauze, ABD and Kerlix/Conform - secure with netting Dressing Change Frequency: Wound #15 Left,Distal,Anterior Lower Leg: Change dressing every other day. Wound #16 Left Calcaneus: Change dressing every other day. Follow-up Appointments: Wound #15 Left,Distal,Anterior Lower  Leg: Return Appointment in 1 week. Wound #16 Left Calcaneus: Return Appointment in 1 week. Off-Loading: Wound #16 Left Calcaneus: Other: - Please continue wearing your boot to protect your heel Electronic Signature(s) Signed: 05/25/2018 6:43:56 PM By: Beather Arbour FNP-C Entered By: Beather Arbour on 05/23/2018 22:14:46 Banker, [name] ([id number]) Heberle,  [name] ([id number]) -------------------------------------------------------------------------------- ROS/PFSH Details Patient Name: LIVY, [name]. Date of Service: 05/23/2018 1:45 PM Medical Record Number: [id number] Patient Account Number: [id number] Date of Birth/Sex: July 11, 1932 (85 y.o. F) Treating RN: Harold Barban Primary Care Provider: Emily Filbert Other Clinician: Referring Provider: Emily Filbert Treating Provider/Extender: Beather Arbour Weeks in Treatment: 9 Label Progress Note Print Version as History and Physical for this encounter Information Obtained From Patient Wound History Do you currently have one or more open woundso Yes How many open wounds do you currently haveo 1 Approximately how long have you had your woundso 2 weeks Have you had any lab work done in the past montho Yes Have you tested positive for osteomyelitis (bone infection)o No Have you had any tests for circulation on your legso No Integumentary (Skin) Complaints and Symptoms: Positive for: Wounds - Left LE Medical History: Negative for: History of Burn; History of pressure wounds Constitutional Symptoms (General Health) Complaints and Symptoms: No Complaints or Symptoms Eyes Medical History: Positive for: Cataracts Negative for: Glaucoma; Optic Neuritis Ear/Nose/Mouth/Throat Medical History: Negative for: Chronic sinus problems/congestion; Middle ear problems Past Medical History Notes: HOH- hearing aids Hematologic/Lymphatic Medical History: Positive for: Anemia Negative for: Hemophilia; Human Immunodeficiency Virus; Lymphedema; Sickle Cell  Disease Respiratory Complaints and Symptoms: No Complaints or Symptoms Medical History: SHEENAH, DIMITROFF ([id number]) Negative for: Aspiration; Asthma; Chronic Obstructive Pulmonary Disease (COPD); Pneumothorax; Sleep Apnea; Tuberculosis Cardiovascular Complaints and Symptoms: No Complaints or Symptoms Medical History: Positive for: Arrhythmia - A fib; Hypertension Negative for: Angina; Congestive Heart Failure; Coronary Artery Disease; Hypotension; Myocardial Infarction; Peripheral Arterial Disease; Peripheral Venous Disease; Phlebitis; Vasculitis Gastrointestinal Complaints and Symptoms: No Complaints or Symptoms Medical History: Negative for: Cirrhosis ; Colitis; Crohnos; Hepatitis A; Hepatitis B; Hepatitis C Endocrine Medical History: Negative for: Type I Diabetes; Type II Diabetes Genitourinary Medical History: Negative for: End Stage Renal Disease Past Medical History Notes: frequent UTIs Immunological Medical History: Negative for: Lupus Erythematosus; Raynaudos; Scleroderma Musculoskeletal Medical History: Positive for: Osteoarthritis Negative for: Gout; Rheumatoid Arthritis Neurologic Medical History: Negative for: Dementia; Neuropathy; Quadriplegia; Paraplegia; Seizure Disorder Oncologic Medical History: Positive for: Received Radiation Negative for: Received Chemotherapy Past Medical History Notes: Breast Ca- R mastectomy Psychiatric ABRIANNA, SIDMAN ([id number]) Medical History: Negative for: Anorexia/bulimia; Confinement Anxiety HBO Extended History Items Eyes: Cataracts Immunizations Pneumococcal Vaccine: Received Pneumococcal Vaccination: No Tetanus Vaccine: Last tetanus shot: 08/15/2008 Implantable Devices Hospitalization / Surgery History Name of Hospital Purpose of Hospitalization/Surgery Date [facility] (R) knee replacement 09/16/2014 Family and Social History Cancer: Yes - Siblings; Heart Disease: Yes - Father; Hereditary Spherocytosis: No; Hypertension: Yes  - Siblings; Kidney Disease: No; Lung Disease: No; Seizures: No; Stroke: Yes - Mother; Thyroid Problems: No; Tuberculosis: No; Never smoker; Marital Status - Married; Alcohol Use: Never; Drug Use: No History; Caffeine Use: Daily; Financial Concerns: No; Food, Clothing or Shelter Needs: No; Support System Lacking: No; Transportation Concerns: No; Advanced Directives: Yes (Not Provided); Patient does not want information on Advanced Directives; Do not resuscitate: No; Living Will: Yes (Not Provided); Medical Power of Attorney: Yes (Not Provided) Physician Affirmation I have reviewed and agree with the above information. Electronic Signature(s) Signed: 05/25/2018 6:43:56 PM By: Beather Arbour FNP-C Signed: 06/24/2018 10:54:06 AM By: Harold Barban Entered By: Beather Arbour on 05/23/2018 22:14:31 Hundertmark, [name] ([id number]) -------------------------------------------------------------------------------- SuperBill Details Patient Name: [name]. Date of Service: 05/23/2018 Medical Record Number: [id number] Patient Account Number: [id number] Date of Birth/Sex: Apr 04, 1933 (85 y.o. F) Treating RN: Harold Barban Primary Care Provider: Emily Filbert Other Clinician:  Referring Provider: Emily Filbert Treating Provider/Extender: Beather Arbour Weeks in Treatment: 9 Diagnosis Coding ICD-10 Codes Code Description S81.802A Unspecified open wound, left lower leg, initial encounter L97.822 Non-pressure chronic ulcer of other part of left lower leg with fat layer exposed L89.626 Pressure-induced deep tissue damage of left heel I73.9 Peripheral vascular disease, unspecified I50.42 Chronic combined systolic (congestive) and diastolic (congestive) heart failure N18.4 Chronic kidney disease, stage 4 (severe) L93.790 Presence of left artificial knee joint Facility Procedures CPT4 Code Description: [id number] - DEBRIDE WOUND 1ST 20 SQ CM OR < ICD-10 Diagnosis Description S81.802A Unspecified open wound,  left lower leg, initial encounter L97.822 Non-pressure chronic ulcer of other part of left lower leg with f Modifier: at layer expos Quantity: 1 ed Physician Procedures CPT4 Code Description: [id number] - WC PHYS DEBR WO ANESTH 20 SQ CM ICD-10 Diagnosis Description S81.802A Unspecified open wound, left lower leg, initial encounter L97.822 Non-pressure chronic ulcer of other part of left lower leg with f Modifier: at layer expos Quantity: 1 ed Electronic Signature(s) Signed: 05/25/2018 6:43:56 PM By: Beather Arbour FNP-C Entered By: Beather Arbour on 05/23/2018 22:15:11

## 2018-06-25 ENCOUNTER — Ambulatory Visit
Admission: RE | Admit: 2018-06-25 | Discharge: 2018-06-25 | Disposition: A | Discharge status: Home / Self Care | Payer: Medicare HMO | Attending: Orthopedic Surgery | Admitting: Orthopedic Surgery

## 2018-06-25 ENCOUNTER — Ambulatory Visit: Payer: Medicare HMO | Admitting: Anesthesiology

## 2018-06-25 ENCOUNTER — Encounter: Payer: Self-pay | Admitting: *Deleted

## 2018-06-25 ENCOUNTER — Encounter
Admission: RE | Disposition: A | Discharge status: Home / Self Care | Payer: Self-pay | Source: Home / Self Care | Attending: Orthopedic Surgery

## 2018-06-25 ENCOUNTER — Ambulatory Visit: Payer: Medicare HMO

## 2018-06-25 DIAGNOSIS — K219 Gastro-esophageal reflux disease without esophagitis: Secondary | ICD-10-CM | POA: Insufficient documentation

## 2018-06-25 DIAGNOSIS — E785 Hyperlipidemia, unspecified: Secondary | ICD-10-CM | POA: Diagnosis not present

## 2018-06-25 DIAGNOSIS — Z86718 Personal history of other venous thrombosis and embolism: Secondary | ICD-10-CM | POA: Insufficient documentation

## 2018-06-25 DIAGNOSIS — D631 Anemia in chronic kidney disease: Secondary | ICD-10-CM | POA: Insufficient documentation

## 2018-06-25 DIAGNOSIS — I739 Peripheral vascular disease, unspecified: Secondary | ICD-10-CM | POA: Diagnosis not present

## 2018-06-25 DIAGNOSIS — Z7982 Long term (current) use of aspirin: Secondary | ICD-10-CM | POA: Insufficient documentation

## 2018-06-25 DIAGNOSIS — Z79899 Other long term (current) drug therapy: Secondary | ICD-10-CM | POA: Insufficient documentation

## 2018-06-25 DIAGNOSIS — F329 Major depressive disorder, single episode, unspecified: Secondary | ICD-10-CM | POA: Insufficient documentation

## 2018-06-25 DIAGNOSIS — S32030A Wedge compression fracture of third lumbar vertebra, initial encounter for closed fracture: Secondary | ICD-10-CM | POA: Insufficient documentation

## 2018-06-25 DIAGNOSIS — E039 Hypothyroidism, unspecified: Secondary | ICD-10-CM | POA: Diagnosis not present

## 2018-06-25 DIAGNOSIS — Z7901 Long term (current) use of anticoagulants: Secondary | ICD-10-CM | POA: Diagnosis not present

## 2018-06-25 DIAGNOSIS — Z96653 Presence of artificial knee joint, bilateral: Secondary | ICD-10-CM | POA: Diagnosis not present

## 2018-06-25 DIAGNOSIS — I130 Hypertensive heart and chronic kidney disease with heart failure and stage 1 through stage 4 chronic kidney disease, or unspecified chronic kidney disease: Secondary | ICD-10-CM | POA: Insufficient documentation

## 2018-06-25 DIAGNOSIS — I5030 Unspecified diastolic (congestive) heart failure: Secondary | ICD-10-CM | POA: Diagnosis not present

## 2018-06-25 DIAGNOSIS — N184 Chronic kidney disease, stage 4 (severe): Secondary | ICD-10-CM | POA: Diagnosis not present

## 2018-06-25 DIAGNOSIS — Z853 Personal history of malignant neoplasm of breast: Secondary | ICD-10-CM | POA: Diagnosis not present

## 2018-06-25 DIAGNOSIS — J449 Chronic obstructive pulmonary disease, unspecified: Secondary | ICD-10-CM | POA: Insufficient documentation

## 2018-06-25 DIAGNOSIS — Z79890 Hormone replacement therapy (postmenopausal): Secondary | ICD-10-CM | POA: Insufficient documentation

## 2018-06-25 DIAGNOSIS — W06XXXA Fall from bed, initial encounter: Secondary | ICD-10-CM | POA: Diagnosis not present

## 2018-06-25 DIAGNOSIS — Z419 Encounter for procedure for purposes other than remedying health state, unspecified: Secondary | ICD-10-CM

## 2018-06-25 DIAGNOSIS — I480 Paroxysmal atrial fibrillation: Secondary | ICD-10-CM | POA: Insufficient documentation

## 2018-06-25 DIAGNOSIS — M545 Low back pain: Secondary | ICD-10-CM | POA: Diagnosis present

## 2018-06-25 HISTORY — PX: KYPHOPLASTY: SHX5884

## 2018-06-25 SURGERY — KYPHOPLASTY
Anesthesia: General

## 2018-06-25 MED ORDER — FENTANYL CITRATE (PF) 100 MCG/2ML IJ SOLN: 25.0000 ug | INTRAMUSCULAR | Status: DC | PRN

## 2018-06-25 MED ORDER — LIDOCAINE HCL 1 % IJ SOLN
INTRAMUSCULAR | Status: DC | PRN
  Administered 2018-06-25: 20 mL

## 2018-06-25 MED ORDER — ONDANSETRON HCL 4 MG PO TABS: 4.0000 mg | ORAL_TABLET | Freq: Four times a day (QID) | ORAL | Status: DC | PRN

## 2018-06-25 MED ORDER — CLINDAMYCIN PHOSPHATE 900 MG/50ML IV SOLN
INTRAVENOUS | Status: AC
  Filled 2018-06-25: qty 50

## 2018-06-25 MED ORDER — METOCLOPRAMIDE HCL 5 MG/ML IJ SOLN: 5.0000 mg | Freq: Three times a day (TID) | INTRAMUSCULAR | Status: DC | PRN

## 2018-06-25 MED ORDER — LACTATED RINGERS IV SOLN
INTRAVENOUS | Status: DC
  Administered 2018-06-25: 12:00:00 via INTRAVENOUS

## 2018-06-25 MED ORDER — PROPOFOL 500 MG/50ML IV EMUL
INTRAVENOUS | Status: DC | PRN
  Administered 2018-06-25: 25 ug/kg/min via INTRAVENOUS

## 2018-06-25 MED ORDER — LIDOCAINE HCL (PF) 2 % IJ SOLN
INTRAMUSCULAR | Status: AC
  Filled 2018-06-25: qty 10

## 2018-06-25 MED ORDER — SODIUM CHLORIDE 0.9 % IV SOLN: INTRAVENOUS | Status: DC

## 2018-06-25 MED ORDER — ONDANSETRON HCL 4 MG/2ML IJ SOLN: 4.0000 mg | Freq: Once | INTRAMUSCULAR | Status: DC | PRN

## 2018-06-25 MED ORDER — METOCLOPRAMIDE HCL 10 MG PO TABS: 5.0000 mg | ORAL_TABLET | Freq: Three times a day (TID) | ORAL | Status: DC | PRN

## 2018-06-25 MED ORDER — ONDANSETRON HCL 4 MG/2ML IJ SOLN: 4.0000 mg | Freq: Four times a day (QID) | INTRAMUSCULAR | Status: DC | PRN

## 2018-06-25 MED ORDER — BUPIVACAINE-EPINEPHRINE (PF) 0.5% -1:200000 IJ SOLN
INTRAMUSCULAR | Status: DC | PRN
  Administered 2018-06-25: 10 mL

## 2018-06-25 MED ORDER — HYDROCODONE-ACETAMINOPHEN 5-325 MG PO TABS: 1.0000 | ORAL_TABLET | ORAL | Status: DC | PRN

## 2018-06-25 MED ORDER — PROPOFOL 10 MG/ML IV BOLUS
INTRAVENOUS | Status: DC | PRN
  Administered 2018-06-25: 30 mg via INTRAVENOUS

## 2018-06-25 MED ORDER — HYDROCODONE-ACETAMINOPHEN 5-325 MG PO TABS: 1.0000 | ORAL_TABLET | Freq: Four times a day (QID) | ORAL | 0 refills | Status: DC | PRN

## 2018-06-25 SURGICAL SUPPLY — 20 items
ADH SKN CLS APL DERMABOND .7 (GAUZE/BANDAGES/DRESSINGS) ×1
CEMENT KYPHON CX01A KIT/MIXER (Cement) ×2 IMPLANT
COVER WAND RF STERILE (DRAPES) ×2 IMPLANT
DERMABOND ADVANCED (GAUZE/BANDAGES/DRESSINGS) ×1
DERMABOND ADVANCED .7 DNX12 (GAUZE/BANDAGES/DRESSINGS) ×1 IMPLANT
DEVICE BIOPSY BONE KYPHX (INSTRUMENTS) ×2 IMPLANT
DRAPE C-ARM XRAY 36X54 (DRAPES) ×2 IMPLANT
DURAPREP 26ML APPLICATOR (WOUND CARE) ×2 IMPLANT
FEE RENTAL RFA GENERATOR (MISCELLANEOUS) IMPLANT
GLOVE SURG SYN 9.0  PF PI (GLOVE) ×1
GLOVE SURG SYN 9.0 PF PI (GLOVE) ×1 IMPLANT
GOWN SRG 2XL LVL 4 RGLN SLV (GOWNS) ×1 IMPLANT
GOWN STRL NON-REIN 2XL LVL4 (GOWNS) ×2
GOWN STRL REUS W/ TWL LRG LVL3 (GOWN DISPOSABLE) ×1 IMPLANT
GOWN STRL REUS W/TWL LRG LVL3 (GOWN DISPOSABLE) ×2
PACK KYPHOPLASTY (MISCELLANEOUS) ×2 IMPLANT
RENTAL RFA GENERATOR (MISCELLANEOUS) IMPLANT
STRAP SAFETY 5IN WIDE (MISCELLANEOUS) ×2 IMPLANT
TRAY KYPHOPAK 15/3 EXPRESS 1ST (MISCELLANEOUS) ×1 IMPLANT
TRAY KYPHOPAK 20/3 EXPRESS 1ST (MISCELLANEOUS) ×2 IMPLANT

## 2018-06-25 NOTE — Anesthesia Post-op Follow-up Note (Signed)
Anesthesia QCDR form completed.        

## 2018-06-25 NOTE — Discharge Instructions (Addendum)
AMBULATORY SURGERY  DISCHARGE INSTRUCTIONS   1) The drugs that you were given will stay in your system until tomorrow so for the next 24 hours you should not:  A) Drive an automobile B) Make any legal decisions C) Drink any alcoholic beverage   2) You may resume regular meals tomorrow.  Today it is better to start with liquids and gradually work up to solid foods.  You may eat anything you prefer, but it is better to start with liquids, then soup and crackers, and gradually work up to solid foods.   3) Please notify your doctor immediately if you have any unusual bleeding, trouble breathing, redness and pain at the surgery site, drainage, fever, or pain not relieved by medication.    4) Additional Instructions:        Please contact your physician with any problems or Same Day Surgery at [phone], Monday through Friday 6 am to 4 pm, or [place] at [facility] number at [phone].  Take it easy today and tomorrow then resume normal activities on Thursday.  Remove Band-Aid on Thursday then okay to shower.  Call office if you are having problems.  Additional pain pills have been sent to your pharmacy.

## 2018-06-25 NOTE — Transfer of Care (Signed)
Immediate Anesthesia Transfer of Care Note  Patient: [name]  Procedure(s) Performed: KYPHOPLASTY L3 (N/A )  Patient Location: PACU  Anesthesia Type:General  Level of Consciousness: awake, alert  and oriented  Airway & Oxygen Therapy: Patient Spontanous Breathing  Post-op Assessment: Report given to RN and Post -op Vital signs reviewed and stable  Post vital signs: Reviewed  Last Vitals:  Vitals Value Taken Time  BP 159/48 06/25/2018  2:23 PM  Temp 36.6 C 06/25/2018  2:23 PM  Pulse 73 06/25/2018  2:23 PM  Resp 20 06/25/2018  2:23 PM  SpO2 100 % 06/25/2018  2:23 PM  Vitals shown include unvalidated device data.  Last Pain:  Vitals:   06/25/18 1155  TempSrc: Temporal  PainSc: 8          Complications: No apparent anesthesia complications

## 2018-06-25 NOTE — H&P (Signed)
Reviewed paper H+P, will be scanned into chart. No changes noted.  

## 2018-06-25 NOTE — Anesthesia Postprocedure Evaluation (Signed)
Anesthesia Post Note  Patient: [name]  Procedure(s) Performed: KYPHOPLASTY L3 (N/A )  Patient location during evaluation: PACU Anesthesia Type: General Level of consciousness: awake and alert and oriented Pain management: pain level controlled Vital Signs Assessment: post-procedure vital signs reviewed and stable Respiratory status: spontaneous breathing, nonlabored ventilation and respiratory function stable Cardiovascular status: blood pressure returned to baseline and stable Postop Assessment: no signs of nausea or vomiting Anesthetic complications: no     Last Vitals:  Vitals:   06/25/18 1508 06/25/18 1519  BP: (!) 179/71 (!) 188/68  Pulse: 74 73  Resp: 18 16  Temp: 36.7 C (!) 36.3 C  SpO2: 100% 99%    Last Pain:  Vitals:   06/25/18 1519  TempSrc: Tympanic  PainSc: 0-No pain                 Ramsay Bognar

## 2018-06-25 NOTE — Op Note (Signed)
Date June 25, 2018  time 2:21 PM   PATIENT:  [name]   PRE-OPERATIVE DIAGNOSIS:  closed wedge compression fracture of L3   POST-OPERATIVE DIAGNOSIS:  closed wedge compression fracture of L3   PROCEDURE:  Procedure(s): KYPHOPLASTY L3  SURGEON: Laurene Footman, MD   ASSISTANTS: None   ANESTHESIA:   local and MAC   EBL:  No intake/output data recorded.   BLOOD ADMINISTERED:none   DRAINS: none    LOCAL MEDICATIONS USED:  MARCAINE    and XYLOCAINE    SPECIMEN:   L3 vertebral body   DISPOSITION OF SPECIMEN:  Pathology   COUNTS:  YES   TOURNIQUET:  * No tourniquets in log *   IMPLANTS: Bone cement   DICTATION: .Dragon Dictation  patient was brought to the operating room and after adequate anesthesia was obtained the patient was placed prone.  C arm was brought in in good visualization of the affected level obtained on both AP and lateral projections.  After patient identification and timeout procedures were completed, local anesthetic was infiltrated with 10 cc 1% Xylocaine infiltrated subcutaneously.  This is done the area on the right side of the planned approach.  The back was then prepped and draped in the usual sterile manner and repeat timeout procedure carried out.  A spinal needle was brought down to the pedicle on the right side of  L3 and a 50-50 mix of 1% Xylocaine half percent Sensorcaine with epinephrine total of 20 cc injected.  After allowing this to set a small incision was made and the trocar was advanced into the vertebral body in an extrapedicular fashion.  Biopsy was obtained Drilling was carried out balloon inserted with inflation to  2-1/2 cc.  When the cement was appropriate consistency 4-1/2 cc were injected into the vertebral body without extravasation, good fill superior to inferior endplates and from right to left sides along the inferior endplate.  After the cement had set the trochar was removed and permanent C-arm views obtained.  The wound was closed  with Dermabond followed by Band-Aid   PLAN OF CARE: Discharge to home after PACU   PATIENT DISPOSITION:  PACU - hemodynamically stable.

## 2018-06-25 NOTE — Anesthesia Procedure Notes (Signed)
Date/Time: 06/25/2018 1:35 PM Performed by: Johnna Acosta, CRNA Pre-anesthesia Checklist: Patient identified, Emergency Drugs available, Suction available, Patient being monitored and Timeout performed Patient Re-evaluated:Patient Re-evaluated prior to induction Oxygen Delivery Method: Nasal cannula Preoxygenation: Pre-oxygenation with 100% oxygen Induction Type: IV induction

## 2018-06-25 NOTE — Anesthesia Preprocedure Evaluation (Signed)
Anesthesia Evaluation  Patient identified by MRN, date of birth, ID band Patient awake    Reviewed: Allergy & Precautions, NPO status , Patient's Chart, lab work & pertinent test results  History of Anesthesia Complications (+) PONV and history of anesthetic complications  Airway Mallampati: II  TM Distance: >3 FB Neck ROM: Full    Dental  (+) Poor Dentition   Pulmonary neg sleep apnea, COPD,    breath sounds clear to auscultation- rhonchi (-) wheezing      Cardiovascular hypertension, Pt. on medications +CHF (preserved EF)  (-) CAD, (-) Past MI, (-) Cardiac Stents and (-) CABG + dysrhythmias Atrial Fibrillation  Rhythm:Regular Rate:Normal - Systolic murmurs and - Diastolic murmurs Echo 16/60/63: - Left ventricle: The cavity size was normal. There was mild   concentric hypertrophy. Systolic function was normal. The   estimated ejection fraction was in the range of 55% to 60%. Wall   motion was normal; there were no regional wall motion   abnormalities. The study is not technically sufficient to allow   evaluation of LV diastolic function. - Mitral valve: Calcified annulus. There was mild regurgitation. - Left atrium: The atrium was mildly dilated. - Pulmonary arteries: Systolic pressure was within the normal   range.   Neuro/Psych neg Seizures PSYCHIATRIC DISORDERS Depression negative neurological ROS     GI/Hepatic Neg liver ROS, GERD  ,  Endo/Other  neg diabetesHypothyroidism   Renal/GU Renal InsufficiencyRenal disease     Musculoskeletal  (+) Arthritis ,   Abdominal (+) - obese,   Peds  Hematology  (+) anemia ,   Anesthesia Other Findings Past Medical History: No date: (HFpEF) heart failure with preserved ejection fraction ([place])     Comment:  a. 12/2015 Echo: EF 60-65%. no rwma, Gr2 DD, triv AI,               mild MR, mildly dil LA. Mild-mod TR. PASP 36mmHg. No date: Anemia No date: Arthritis     Comment:   "back, hands" (09/08/2015) 2007, 2013: Cancer of right breast [facility])     Comment:  right breast- 2007 radiation-mastectomy No date: Chronic lower back pain No date: CKD (chronic kidney disease), stage IV (HCC) No date: Complication of anesthesia     Comment:  "took me about 1 week to know what was going on after               one of my knee ORs" No date: COPD (chronic obstructive pulmonary disease) (HCC) No date: DDD (degenerative disc disease), lumbar No date: Depression 09/01/2015: Ductal carcinoma in situ (DCIS) of right breast No date: DVT (deep venous thrombosis) (HCC) No date: Edema     Comment:  FEET/ANKLES No date: GERD (gastroesophageal reflux disease) No date: History of sepsis No date: History of stress test     Comment:  a. 08/2010: EF 73%, no ischemia/infarct. No date: Hyperlipidemia No date: Hypertension No date: Hypothyroid No date: PAD (peripheral artery disease) ([place])     Comment:  a. 08/2015 s/p PTA of R AT w/ drug-coated balloon               angioplasty to R Popliteal; b. 05/2017 ABI: R 1.16, L               0.88-->stable. No date: PAF (paroxysmal atrial fibrillation) ([place])     Comment:  a. 08/2010 s/p DCCV;  b. CHA2DS2VASc = 6-->No OAC 2/2 h/o  falls. No date: PONV (postoperative nausea and vomiting)     Comment:  nausea vomiting long ago with surgery but not recent               ones   Reproductive/Obstetrics                             Anesthesia Physical Anesthesia Plan  ASA: III  Anesthesia Plan: General   Post-op Pain Management:    Induction: Intravenous  PONV Risk Score and Plan: 3 and Propofol infusion  Airway Management Planned: Natural Airway  Additional Equipment:   Intra-op Plan:   Post-operative Plan:   Informed Consent: I have reviewed the patients History and Physical, chart, labs and discussed the procedure including the risks, benefits and alternatives for the proposed anesthesia with the  patient or authorized representative who has indicated his/her understanding and acceptance.     Dental advisory given  Plan Discussed with: CRNA and Anesthesiologist  Anesthesia Plan Comments:         Anesthesia Quick Evaluation

## 2018-06-27 LAB — SURGICAL PATHOLOGY

## 2018-07-11 ENCOUNTER — Ambulatory Visit: Payer: Medicare HMO | Admitting: Physician Assistant

## 2018-07-18 ENCOUNTER — Ambulatory Visit: Payer: Medicare HMO | Admitting: Physician Assistant

## 2018-07-25 ENCOUNTER — Encounter: Payer: Medicare HMO | Attending: Internal Medicine | Admitting: Internal Medicine

## 2018-07-25 ENCOUNTER — Other Ambulatory Visit: Payer: Self-pay

## 2018-07-25 DIAGNOSIS — I130 Hypertensive heart and chronic kidney disease with heart failure and stage 1 through stage 4 chronic kidney disease, or unspecified chronic kidney disease: Secondary | ICD-10-CM | POA: Insufficient documentation

## 2018-07-25 DIAGNOSIS — I5042 Chronic combined systolic (congestive) and diastolic (congestive) heart failure: Secondary | ICD-10-CM | POA: Insufficient documentation

## 2018-07-25 DIAGNOSIS — Z96652 Presence of left artificial knee joint: Secondary | ICD-10-CM | POA: Insufficient documentation

## 2018-07-25 DIAGNOSIS — I4891 Unspecified atrial fibrillation: Secondary | ICD-10-CM | POA: Diagnosis not present

## 2018-07-25 DIAGNOSIS — Z853 Personal history of malignant neoplasm of breast: Secondary | ICD-10-CM | POA: Insufficient documentation

## 2018-07-25 DIAGNOSIS — M1990 Unspecified osteoarthritis, unspecified site: Secondary | ICD-10-CM | POA: Insufficient documentation

## 2018-07-25 DIAGNOSIS — L89626 Pressure-induced deep tissue damage of left heel: Secondary | ICD-10-CM | POA: Diagnosis not present

## 2018-07-25 DIAGNOSIS — L97822 Non-pressure chronic ulcer of other part of left lower leg with fat layer exposed: Secondary | ICD-10-CM | POA: Diagnosis not present

## 2018-07-25 DIAGNOSIS — Z8249 Family history of ischemic heart disease and other diseases of the circulatory system: Secondary | ICD-10-CM | POA: Insufficient documentation

## 2018-07-25 DIAGNOSIS — N184 Chronic kidney disease, stage 4 (severe): Secondary | ICD-10-CM | POA: Diagnosis not present

## 2018-07-25 DIAGNOSIS — Z87440 Personal history of urinary (tract) infections: Secondary | ICD-10-CM | POA: Diagnosis not present

## 2018-07-25 DIAGNOSIS — Z9011 Acquired absence of right breast and nipple: Secondary | ICD-10-CM | POA: Diagnosis not present

## 2018-07-25 DIAGNOSIS — L97422 Non-pressure chronic ulcer of left heel and midfoot with fat layer exposed: Secondary | ICD-10-CM | POA: Diagnosis not present

## 2018-07-25 DIAGNOSIS — I739 Peripheral vascular disease, unspecified: Secondary | ICD-10-CM | POA: Diagnosis not present

## 2018-07-25 DIAGNOSIS — Z7901 Long term (current) use of anticoagulants: Secondary | ICD-10-CM | POA: Insufficient documentation

## 2018-07-25 DIAGNOSIS — S81802A Unspecified open wound, left lower leg, initial encounter: Secondary | ICD-10-CM | POA: Diagnosis not present

## 2018-07-25 DIAGNOSIS — W228XXA Striking against or struck by other objects, initial encounter: Secondary | ICD-10-CM | POA: Insufficient documentation

## 2018-07-25 DIAGNOSIS — G8929 Other chronic pain: Secondary | ICD-10-CM | POA: Insufficient documentation

## 2018-07-25 DIAGNOSIS — Z79891 Long term (current) use of opiate analgesic: Secondary | ICD-10-CM | POA: Diagnosis not present

## 2018-07-25 NOTE — Progress Notes (Signed)
[name], [name] ([id number]) Visit Report for 07/25/2018 Arrival Information Details Patient Name: [name], [name]. Date of Service: 07/25/2018 3:00 PM Medical Record Number: [id number] Patient Account Number: [id number] Date of Birth/Sex: 02/07/1933 (85 y.o. F) Treating RN: Montey Hora Primary Care Rorey Bisson: Emily Filbert Other Clinician: Referring Tahnee Cifuentes: Emily Filbert Treating Azan Maneri/Extender: Tito Dine in Treatment: 18 Visit Information History Since Last Visit Added or deleted any medications: No Patient Arrived: Cane Any new allergies or adverse reactions: No Arrival Time: 15:35 Had a fall or experienced change in No Accompanied By: self activities of daily living that may affect Transfer Assistance: None risk of falls: Patient Identification Verified: Yes Signs or symptoms of abuse/neglect since last visito No Secondary Verification Process Yes Hospitalized since last visit: No Completed: Implantable device outside of the clinic excluding No Patient Has Alerts: Yes cellular tissue based products placed in the center Patient Alerts: Patient on Blood since last visit: Thinner Has Dressing in Place as Prescribed: Yes Eliquis Pain Present Now: No ABI 05/23/17 L.88 R 1.16 Electronic Signature(s) Signed: 07/25/2018 4:45:44 PM By: Montey Hora Entered By: Montey Hora on 07/25/2018 15:37:55 Woodin, [name] ([id number]) -------------------------------------------------------------------------------- Clinic Level of Care Assessment Details Patient Name: [name] Date of Service: 07/25/2018 3:00 PM Medical Record Number: [id number] Patient Account Number: [id number] Date of Birth/Sex: 03-Mar-1933 (85 y.o. F) Treating RN: Cornell Barman Primary Care Nature Vogelsang: Emily Filbert Other Clinician: Referring Keiland Pickering: Emily Filbert Treating Terre Hanneman/Extender: Tito Dine in Treatment: 18 Clinic Level of Care Assessment Items TOOL 4 Quantity Score []  - Use when only an  EandM is performed on FOLLOW-UP visit 0 ASSESSMENTS - Nursing Assessment / Reassessment []  - Reassessment of Co-morbidities (includes updates in patient status) 0 []  - 0 Reassessment of Adherence to Treatment Plan ASSESSMENTS - Wound and Skin Assessment / Reassessment []  - Simple Wound Assessment / Reassessment - one wound 0 X- 1 5 Complex Wound Assessment / Reassessment - multiple wounds []  - 0 Dermatologic / Skin Assessment (not related to wound area) ASSESSMENTS - Focused Assessment []  - Circumferential Edema Measurements - multi extremities 0 []  - 0 Nutritional Assessment / Counseling / Intervention []  - 0 Lower Extremity Assessment (monofilament, tuning fork, pulses) []  - 0 Peripheral Arterial Disease Assessment (using hand held doppler) ASSESSMENTS - Ostomy and/or Continence Assessment and Care []  - Incontinence Assessment and Management 0 []  - 0 Ostomy Care Assessment and Management (repouching, etc.) PROCESS - Coordination of Care X - Simple Patient / Family Education for ongoing care 1 15 []  - 0 Complex (extensive) Patient / Family Education for ongoing care X- 1 10 Staff obtains Programmer, systems, Records, Test Results / Process Orders []  - 0 Staff telephones HHA, Nursing Homes / Clarify orders / etc []  - 0 Routine Transfer to another Facility (non-emergent condition) []  - 0 Routine Hospital Admission (non-emergent condition) []  - 0 New Admissions / Biomedical engineer / Ordering NPWT, Apligraf, etc. []  - 0 Emergency Hospital Admission (emergent condition) X- 1 10 Simple Discharge Coordination Latoya, Bailey. ([id number]) []  - 0 Complex (extensive) Discharge Coordination PROCESS - Special Needs []  - Pediatric / Minor Patient Management 0 []  - 0 Isolation Patient Management []  - 0 Hearing / Language / Visual special needs []  - 0 Assessment of Community assistance (transportation, D/C planning, etc.) []  - 0 Additional assistance / Altered mentation []  - 0 Support  Surface(s) Assessment (bed, cushion, seat, etc.) INTERVENTIONS - Wound Cleansing / Measurement X - Simple Wound Cleansing - one wound 1 5 []  -  0 Complex Wound Cleansing - multiple wounds X- 1 5 Wound Imaging (photographs - any number of wounds) []  - 0 Wound Tracing (instead of photographs) X- 1 5 Simple Wound Measurement - one wound []  - 0 Complex Wound Measurement - multiple wounds INTERVENTIONS - Wound Dressings []  - Small Wound Dressing one or multiple wounds 0 X- 1 15 Medium Wound Dressing one or multiple wounds []  - 0 Large Wound Dressing one or multiple wounds []  - 0 Application of Medications - topical []  - 0 Application of Medications - injection INTERVENTIONS - Miscellaneous []  - External ear exam 0 []  - 0 Specimen Collection (cultures, biopsies, blood, body fluids, etc.) []  - 0 Specimen(s) / Culture(s) sent or taken to Lab for analysis []  - 0 Patient Transfer (multiple staff / Civil Service fast streamer / Similar devices) []  - 0 Simple Staple / Suture removal (25 or less) []  - 0 Complex Staple / Suture removal (26 or more) []  - 0 Hypo / Hyperglycemic Management (close monitor of Blood Glucose) []  - 0 Ankle / Brachial Index (ABI) - do not check if billed separately X- 1 5 Vital Signs [name], Latoya A. ([id number]) Has the patient been seen at the hospital within the last three years: Yes Total Score: 75 Level Of Care: New/Established - Level 2 Electronic Signature(s) Signed: 07/25/2018 7:10:06 PM By: Gretta Cool, BSN, RN, CWS, Kim RN, BSN Entered By: Gretta Cool, BSN, RN, CWS, Kim on 07/25/2018 16:01:08 Caddell, [name] ([id number]) -------------------------------------------------------------------------------- Lower Extremity Assessment Details Patient Name: [name]. Date of Service: 07/25/2018 3:00 PM Medical Record Number: [id number] Patient Account Number: [id number] Date of Birth/Sex: February 16, 1933 (85 y.o. F) Treating RN: Montey Hora Primary Care Ovadia Lopp: Emily Filbert Other  Clinician: Referring Baylee Campus: Emily Filbert Treating Josede Cicero/Extender: Tito Dine in Treatment: 18 Edema Assessment Assessed: [Left: No] [Right: No] Edema: [Left: No] [Right: No] Vascular Assessment Pulses: Dorsalis Pedis Palpable: [Left:Yes] Electronic Signature(s) Signed: 07/25/2018 4:45:44 PM By: Montey Hora Entered By: Montey Hora on 07/25/2018 15:47:38 Sotomayor, [name] ([id number]) -------------------------------------------------------------------------------- Multi Wound Chart Details Patient Name: [name]. Date of Service: 07/25/2018 3:00 PM Medical Record Number: [id number] Patient Account Number: [id number] Date of Birth/Sex: 04-27-1932 (85 y.o. F) Treating RN: Cornell Barman Primary Care Mindie Rawdon: Emily Filbert Other Clinician: Referring Sage Hammill: Emily Filbert Treating Manjot Beumer/Extender: Tito Dine in Treatment: 18 Vital Signs Height(in): 68 Pulse(bpm): 53 Weight(lbs): 121 Blood Pressure(mmHg): 132/40 Body Mass Index(BMI): 18 Temperature(F): 97.9 Respiratory Rate 16 (breaths/min): Photos: [N/A:N/A] Wound Location: Left Lower Leg - Anterior, Left Calcaneus N/A Distal Wounding Event: Shear/Friction Pressure Injury N/A Primary Etiology: Skin Tear Pressure Ulcer N/A Comorbid History: Cataracts, Anemia, N/A N/A Arrhythmia, Hypertension, Osteoarthritis, Received Radiation Date Acquired: 03/05/2018 03/11/2018 N/A Weeks of Treatment: 18 18 N/A Wound Status: Open Healed - Epithelialized N/A Measurements L x W x D 1.4x0.5x0.1 0x0x0 N/A (cm) Area (cm) : 0.55 0 N/A Volume (cm) : 0.055 0 N/A % Reduction in Area: 96.30% 100.00% N/A % Reduction in Volume: 98.10% 100.00% N/A Classification: Full Thickness Without Category/Stage III N/A Exposed Support Structures Exudate Amount: Medium N/A N/A Exudate Type: Serous N/A N/A Exudate Color: amber N/A N/A Wound Margin: Flat and Intact N/A N/A Granulation Amount: Large (67-100%) N/A  N/A Granulation Quality: Pink N/A N/A Necrotic Amount: Small (1-33%) N/A N/A Exposed Structures: Fat Layer (Subcutaneous N/A N/A Tissue) Exposed: Yes Fascia: No Bailey, Latoya A. ([id number]) Tendon: No Muscle: No Joint: No Bone: No Epithelialization: Small (1-33%) N/A N/A Periwound Skin Texture: Scarring: Yes No Abnormalities Noted N/A  Excoriation: No Induration: No Callus: No Crepitus: No Rash: No Periwound Skin Moisture: Maceration: No No Abnormalities Noted N/A Dry/Scaly: No Periwound Skin Color: Atrophie Blanche: No No Abnormalities Noted N/A Cyanosis: No Ecchymosis: No Erythema: No Hemosiderin Staining: No Mottled: No Pallor: No Rubor: No Temperature: No Abnormality N/A N/A Tenderness on Palpation: Yes No N/A Treatment Notes Wound #15 (Left, Distal, Anterior Lower Leg) Notes Prisma Ag, BFD Electronic Signature(s) Signed: 07/25/2018 6:06:34 PM By: Linton Ham MD Entered By: Linton Ham on 07/25/2018 17:48:21 Sudol, [name] ([id number]) -------------------------------------------------------------------------------- [place] Details Patient Name: [name]. Date of Service: 07/25/2018 3:00 PM Medical Record Number: [id number] Patient Account Number: [id number] Date of Birth/Sex: 1932-06-18 (85 y.o. F) Treating RN: Cornell Barman Primary Care Konner Saiz: Emily Filbert Other Clinician: Referring Christofer Shen: Emily Filbert Treating Kiaira Pointer/Extender: Tito Dine in Treatment: 20 Active Inactive Abuse / Safety / Falls / Self Care Management Nursing Diagnoses: Potential for falls Goals: Patient will not develop complications from immobility Date Initiated: 03/19/2018 Target Resolution Date: 06/22/2018 Goal Status: Active Interventions: Assess fall risk on admission and as needed Notes: Nutrition Nursing Diagnoses: Potential for alteratiion in Nutrition/Potential for imbalanced nutrition Goals: Patient/caregiver agrees to and verbalizes  understanding of need to use nutritional supplements and/or vitamins as prescribed Date Initiated: 03/19/2018 Target Resolution Date: 06/22/2018 Goal Status: Active Interventions: Assess patient nutrition upon admission and as needed per policy Notes: Orientation to the Wound Care Program Nursing Diagnoses: Knowledge deficit related to the wound healing center program Goals: Patient/caregiver will verbalize understanding of the [facility] Program Date Initiated: 03/19/2018 Target Resolution Date: 06/22/2018 Goal Status: Active Interventions: Provide education on orientation to the wound center [name], [name]. ([id number]) Notes: Pressure Nursing Diagnoses: Potential for impaired tissue integrity related to pressure, friction, moisture, and shear Goals: Patient will remain free from development of additional pressure ulcers Date Initiated: 03/19/2018 Target Resolution Date: 06/22/2018 Goal Status: Active Interventions: Assess potential for pressure ulcer upon admission and as needed Provide education on pressure ulcers Notes: Wound/Skin Impairment Nursing Diagnoses: Impaired tissue integrity Goals: Ulcer/skin breakdown will heal within 14 weeks Date Initiated: 03/19/2018 Target Resolution Date: 06/22/2018 Goal Status: Active Interventions: Assess patient/caregiver ability to obtain necessary supplies Assess patient/caregiver ability to perform ulcer/skin care regimen upon admission and as needed Assess ulceration(s) every visit Notes: Electronic Signature(s) Signed: 07/25/2018 7:10:06 PM By: Gretta Cool, BSN, RN, CWS, Kim RN, BSN Entered By: Gretta Cool, BSN, RN, CWS, Kim on 07/25/2018 15:57:25 Lorge, [name] ([id number]) -------------------------------------------------------------------------------- Pain Assessment Details Patient Name: [name]. Date of Service: 07/25/2018 3:00 PM Medical Record Number: [id number] Patient Account Number: [id number] Date of Birth/Sex: 1932/11/09 (85 y.o.  F) Treating RN: Montey Hora Primary Care Braelin Brosch: Emily Filbert Other Clinician: Referring Angelmarie Ponzo: Emily Filbert Treating Mavin Dyke/Extender: Tito Dine in Treatment: 89 Active Problems Location of Pain Severity and Description of Pain Patient Has Paino No Site Locations Pain Management and Medication Current Pain Management: Electronic Signature(s) Signed: 07/25/2018 4:45:44 PM By: Montey Hora Entered By: Montey Hora on 07/25/2018 15:38:46 Holmer, [name] ([id number]) -------------------------------------------------------------------------------- Patient/Caregiver Education Details Patient Name: [name]. Date of Service: 07/25/2018 3:00 PM Medical Record Number: [id number] Patient Account Number: [id number] Date of Birth/Gender: 28-Jul-1932 (85 y.o. F) Treating RN: Cornell Barman Primary Care Physician: Emily Filbert Other Clinician: Referring Physician: Emily Filbert Treating Physician/Extender: Tito Dine in Treatment: 82 Education Assessment Education Provided To: Caregiver Education Topics Provided Wound/Skin Impairment: Handouts: Caring for Your Ulcer Methods: Demonstration, Explain/Verbal Responses: State content correctly  Electronic Signature(s) Signed: 07/25/2018 7:10:06 PM By: Gretta Cool, BSN, RN, CWS, Kim RN, BSN Entered By: Gretta Cool, BSN, RN, CWS, Kim on 07/25/2018 16:01:25 Brine, [name] ([id number]) -------------------------------------------------------------------------------- Wound Assessment Details Patient Name: [name]. Date of Service: 07/25/2018 3:00 PM Medical Record Number: [id number] Patient Account Number: [id number] Date of Birth/Sex: [birth year] (85 y.o. F) Treating RN: Montey Hora Primary Care Temekia Caskey: Emily Filbert Other Clinician: Referring Osei Anger: Emily Filbert Treating Erryn Dickison/Extender: Tito Dine in Treatment: 18 Wound Status Wound Number: 15 Primary Skin Tear Etiology: Wound Location: Left Lower Leg -  Anterior, Distal Wound Open Wounding Event: Shear/Friction Status: Date Acquired: 03/05/2018 Comorbid Cataracts, Anemia, Arrhythmia, Hypertension, Weeks Of Treatment: 18 History: Osteoarthritis, Received Radiation Clustered Wound: No Photos Wound Measurements Length: (cm) 1.4 Width: (cm) 0.5 Depth: (cm) 0.1 Area: (cm) 0.55 Volume: (cm) 0.055 % Reduction in Area: 96.3% % Reduction in Volume: 98.1% Epithelialization: Small (1-33%) Undermining: No Wound Description Full Thickness Without Exposed Support Classification: Structures Wound Margin: Flat and Intact Exudate Medium Amount: Exudate Type: Serous Exudate Color: amber Foul Odor After Cleansing: No Slough/Fibrino Yes Wound Bed Granulation Amount: Large (67-100%) Exposed Structure Granulation Quality: Pink Fascia Exposed: No Necrotic Amount: Small (1-33%) Fat Layer (Subcutaneous Tissue) Exposed: Yes Necrotic Quality: Adherent Slough Tendon Exposed: No Muscle Exposed: No Joint Exposed: No Bone Exposed: No Periwound Skin Texture Bailey, Latoya A. ([id number]) Texture Color No Abnormalities Noted: No No Abnormalities Noted: No Callus: No Atrophie Blanche: No Crepitus: No Cyanosis: No Excoriation: No Ecchymosis: No Induration: No Erythema: No Rash: No Hemosiderin Staining: No Scarring: Yes Mottled: No Pallor: No Moisture Rubor: No No Abnormalities Noted: No Dry / Scaly: No Temperature / Pain Maceration: No Temperature: No Abnormality Tenderness on Palpation: Yes Treatment Notes Wound #15 (Left, Distal, Anterior Lower Leg) Notes Prisma Ag, BFD Electronic Signature(s) Signed: 07/25/2018 4:45:44 PM By: Montey Hora Entered By: Montey Hora on 07/25/2018 15:47:14 Szczepanik, [name] ([id number]) -------------------------------------------------------------------------------- Wound Assessment Details Patient Name: [name]. Date of Service: 07/25/2018 3:00 PM Medical Record Number: [id number] Patient  Account Number: [id number] Date of Birth/Sex: 08/29/1932 (85 y.o. F) Treating RN: Montey Hora Primary Care Belvia Gotschall: Emily Filbert Other Clinician: Referring Khanh Tanori: Emily Filbert Treating Florene Brill/Extender: Tito Dine in Treatment: 18 Wound Status Wound Number: 16 Primary Etiology: Pressure Ulcer Wound Location: Left Calcaneus Wound Status: Healed - Epithelialized Wounding Event: Pressure Injury Date Acquired: 03/11/2018 Weeks Of Treatment: 18 Clustered Wound: No Photos Photo Uploaded By: Montey Hora on 07/25/2018 15:47:55 Wound Measurements Length: (cm) 0 Width: (cm) 0 Depth: (cm) 0 Area: (cm) 0 Volume: (cm) 0 % Reduction in Area: 100% % Reduction in Volume: 100% Wound Description Classification: Category/Stage III Periwound Skin Texture Texture Color No Abnormalities Noted: No No Abnormalities Noted: No Moisture No Abnormalities Noted: No Electronic Signature(s) Signed: 07/25/2018 4:45:44 PM By: Montey Hora Entered By: Montey Hora on 07/25/2018 15:47:26 Clink, [name] ([id number]) -------------------------------------------------------------------------------- Vitals Details Patient Name: [name]. Date of Service: 07/25/2018 3:00 PM Medical Record Number: [id number] Patient Account Number: [id number] Date of Birth/Sex: Aug 22, 1932 (85 y.o. F) Treating RN: Montey Hora Primary Care Damontae Loppnow: Emily Filbert Other Clinician: Referring Dashauna Heymann: Emily Filbert Treating Micheale Schlack/Extender: Tito Dine in Treatment: 18 Vital Signs Time Taken: 15:38 Temperature (F): 97.9 Height (in): 68 Pulse (bpm): 63 Weight (lbs): 121 Respiratory Rate (breaths/min): 16 Body Mass Index (BMI): 18.4 Blood Pressure (mmHg): 132/40 Reference Range: 80 - 120 mg / dl Electronic Signature(s) Signed: 07/25/2018 4:45:44 PM By: Montey Hora Entered By: Montey Hora on  07/25/2018 15:42:59 

## 2018-07-25 NOTE — Progress Notes (Signed)
ANACLARA, ACKLIN ([id number]) Visit Report for 07/25/2018 HPI Details Patient Name: [name], [name]. Date of Service: 07/25/2018 3:00 PM Medical Record Number: [id number] Patient Account Number: [id number] Date of Birth/Sex: March 22, 1933 (85 y.o. F) Treating RN: Cornell Barman Primary Care Provider: Emily Filbert Other Clinician: Referring Provider: Emily Filbert Treating Provider/Extender: Tito Dine in Treatment: 18 History of Present Illness HPI Description: 85 year old patient who is known to the wound center for several years now has a new injury to the right lower extremity where she bumped herself against the bath tub. He also has had a chronic right lateral malleolus open wound which has been coming on and off for several years now. This has been there for at least 2014 and was healed out in November 2016. In the past she has been seen by her cardiologist Dr. Kathlyn Sacramento in April 2016. Noninvasive vascular evaluation showed an ABI of 0.68 on the right and 0.85 on the left. 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 One-vessel runoff below the knee via the peroneal artery with short occlusion of the anterior tibial artery and reconstitution. No intervention performed. Most recently she was seen for a wound on her right lower extremity which was healed out by March 15 She is not a smoker and has had no diabetes mellitus. 07/29/2015 -- the appointment with her cardiologist Dr. Fletcher Anon is still pending for review of her arterial duplex studies. Readmission: 03/19/18 on evaluation today patient presents for readmission concerning an issue she has been having with her left heel and left anterior lower extremity. She has a history of atrial fibrillation and had knee surgery for a total knee replacement three weeks ago today. Subsequently since being discharged from the hospital it was noted that she did have a left heel  deep tissue injury as well the left anterior lower extremity ulcer. This has been present apparently for roughly 2 weeks. She does not appear to have any evidence of infection which is good news. She has been tolerating the dressing changes that have been performed it sounds as if Santyl is what has been used at least most recently. Obviously I think this is likely a good treatment choice for her. Especially in light of the fact that the wound on the distal lower extremity of the left leg appears to show signs of significant slough/eschar buildup which is starting to loosen up now that she is using the central but prior was fairly significant as far as how hard and black it was. She does have pain at both sites the open wound more than the deep tissue injury. Otherwise the patient does have a history of peripheral vascular disease, congestive heart failure, chronic kidney disease stage IV, and now the presence of a left artificial knee joint. 03/28/18 on evaluation today patient appears to be doing a little worse in regard to the dorsal surface of her lower extremity. She has been tolerating the dressing changes without complication although she states there's a lot of pain at this location. Subsequently in regard to her left heel the show signs of continued to drain with maceration noted. I think that some of the dead tissue needs to be removed from the surface of the wound at the site to allow it to heal appropriately. 04/02/18 on evaluation today patient's wounds actually appear to be doing better at this point in regard to the dorsal wound on the anterior lower leg as well as  the heel ulcer. She does have an area of thicker eschar of the heel which I think actually needs to be loosened up I think cental or Iodoflex could be beneficial in this regard. Otherwise debridement will likely be undertaken today over the lower leg region in order to clear away some of the necrotic material. 04/09/18 on  evaluation today a lot of the skin which is around the patient's heel ulcer actually is lifting off which is good news. There is some Slough noted on the surface of one centrally this is going to require sharp debridement. The dorsal surface of her foot wound actually appears to be doing much better which is great news. Overall I'm fairly pleased with how things seem to be progressing. The patient likewise is worried about her heel but otherwise feels like things are going fairly well. 04/23/18 on evaluation today patient appears to be doing fairly well in regard to her ulcer. She has been tolerating the dressing Cerutti, Kenedi A. ([id number]) changes with the Santyl and overall I do believe this has been of benefit. With that being said she is still having a lot of issues with pain on the dorsal aspect of her foot although I do believe she's slowly making progress which is good news. No fevers, chills, nausea, or vomiting noted at this time. 04/30/18 on evaluation today patient unfortunately appears to be doing a little bit worse I'm concerned about cellulitis around her wounds. Fortunately there does not appear to be any evidence of infection at this time which is good news. That is systemically. Unfortunately she does have increased pain. 05/07/18 on evaluation today patient appears to be doing decently well in regard to her wounds at this point. With that being said she tells me that she had a fall where she actually struck her head on Saturday roughly 72 hours ago. With that being said she doesn't note any dizziness or confusion but has had a headache. She has continued to take the Keflex which was prescribed for her. She is on Eliquis which obviously is somewhat of a concern. The other issue is that upon evaluation today she seemed to have a very interesting issue going on with her blood pressure we were able to obtain a systolic blood pressure manually though the diastolic pressure was around 32 with  the machine and when I took this manually I could hear the diastolic sounds even when the cough was completely deflated and in fact even when it was removed from her arm. This was a phenomenon that I had not seen previous. For this reason I was concerned enough especially with a history of head trauma to contact her cardiologist, Dr. Sophronia Simas, however he is out of the office at this point due to his wife having had a baby. Nonetheless I did end up speaking with the cardiologist on call and his recommendation was more or less an echo of what I was concerned about which was the patient should likely go to the hospital to have things checked out especially with her history of anticoagulant therapy. 05/14/18 on evaluation today patient's wounds both in regard to the heel and her lower leg appear to be doing better especially in overall appearance. Fortunately there does not appear to be any evidence of infection at this time although unfortunately she tells me she's been having some discomfort over the past week with the dressing changes. We have been using Iodoflex. Overall I do think it's helped improve the quality of the  wound although I'm not sure if her discomfort is going to be a limiting factor here. 05/23/2018 Seen today for follow-up and management of left heel and left lower leg wound. Today both wounds have a pretty significant amount of soft on the surface of the wound with surrounding callus. She has expressed in the past of having some discomfort and was hesitant with debridement today. However after explanation and allowing her to express her concerns and address her question she is willing to have the wounds debrided. Her husband has been assisting with the dressing changes. She did mention today that she has chronic back pain and uses OxyContin to help with that pain. No s/s of infection. Denies any recent fevers, increased pain, chills, or shortness of breath during visit today. 06/20/18 on  evaluation today patient actually appears to be doing better in regard to her left lower extremity and heel wounds. Currently the patient has been doing very well and overall seems to be progressing quite nicely in my opinion. In fact there is a lot of new skin growth that appears to be doing very well upon evaluation today. 4/6; 1 month follow-up left lower extremity and left heel. Left heel is closed. She still complains of pain in the left heel however. Electronic Signature(s) Signed: 07/25/2018 6:06:34 PM By: Linton Ham MD Entered By: Linton Ham on 07/25/2018 17:49:25 Hulen, [name] ([id number]) -------------------------------------------------------------------------------- Physical Exam Details Patient Name: [name] Date of Service: 07/25/2018 3:00 PM Medical Record Number: [id number] Patient Account Number: [id number] Date of Birth/Sex: 08/06/1932 (85 y.o. F) Treating RN: Cornell Barman Primary Care Provider: Emily Filbert Other Clinician: Referring Provider: Emily Filbert Treating Provider/Extender: Tito Dine in Treatment: 18 Constitutional Sitting or standing Blood Pressure is within target range for patient.. Pulse regular and within target range for patient.Marland Kitchen Respirations regular, non-labored and within target range.. Temperature is normal and within the target range for the patient.Marland Kitchen appears in no distress. Notes Wound exam; the left leg wound looks satisfactory. No debridement is required. The left heel is closed. Electronic Signature(s) Signed: 07/25/2018 6:06:34 PM By: Linton Ham MD Entered By: Linton Ham on 07/25/2018 17:50:25 Revolorio, [name] ([id number]) -------------------------------------------------------------------------------- Physician Orders Details Patient Name: [name] Date of Service: 07/25/2018 3:00 PM Medical Record Number: [id number] Patient Account Number: [id number] Date of Birth/Sex: [birth year] (85 y.o. F) Treating RN: Cornell Barman Primary Care Provider: Emily Filbert Other Clinician: Referring Provider: Emily Filbert Treating Provider/Extender: Tito Dine in Treatment: 13 Verbal / Phone Orders: No Diagnosis Coding Wound Cleansing Wound #15 Left,Distal,Anterior Lower Leg o Cleanse wound with mild soap and water Primary Wound Dressing Wound #15 Left,Distal,Anterior Lower Leg o Silver Collagen Secondary Dressing Wound #15 Left,Distal,Anterior Lower Leg o Boardered Foam Dressing Dressing Change Frequency Wound #15 Left,Distal,Anterior Lower Leg o Change dressing every other day. Follow-up Appointments Wound #15 Left,Distal,Anterior Lower Leg o Return Appointment in 3 weeks. Electronic Signature(s) Signed: 07/25/2018 6:06:34 PM By: Linton Ham MD Signed: 07/25/2018 7:10:06 PM By: Gretta Cool, BSN, RN, CWS, Kim RN, BSN Entered By: Gretta Cool, BSN, RN, CWS, Kim on 07/25/2018 16:00:35 Heiden, [name] ([id number]) -------------------------------------------------------------------------------- Problem List Details Patient Name: BEREA, MAJKOWSKI. Date of Service: 07/25/2018 3:00 PM Medical Record Number: [id number] Patient Account Number: [id number] Date of Birth/Sex: 12/27/1932 (85 y.o. F) Treating RN: Cornell Barman Primary Care Provider: Emily Filbert Other Clinician: Referring Provider: Emily Filbert Treating Provider/Extender: Tito Dine in Treatment: 29 Active Problems ICD-10 Evaluated Encounter Code Description Active Date Today  Diagnosis S81.802A Unspecified open wound, left lower leg, initial encounter 03/19/2018 No Yes L97.822 Non-pressure chronic ulcer of other part of left lower leg with 03/19/2018 No Yes fat layer exposed L89.626 Pressure-induced deep tissue damage of left heel 03/19/2018 No Yes I73.9 Peripheral vascular disease, unspecified 03/19/2018 No Yes I50.42 Chronic combined systolic (congestive) and diastolic 63/11/7562 No Yes (congestive) heart failure N18.4 Chronic kidney  disease, stage 4 (severe) 03/19/2018 No Yes Z96.652 Presence of left artificial knee joint 03/19/2018 No Yes Inactive Problems Resolved Problems Electronic Signature(s) Signed: 07/25/2018 6:06:34 PM By: Linton Ham MD Entered By: Linton Ham on 07/25/2018 17:48:11 Chiou, [name] ([id number]) -------------------------------------------------------------------------------- Progress Note Details Patient Name: [name] Date of Service: 07/25/2018 3:00 PM Medical Record Number: [id number] Patient Account Number: [id number] Date of Birth/Sex: 05-12-1932 (85 y.o. F) Treating RN: Cornell Barman Primary Care Provider: Emily Filbert Other Clinician: Referring Provider: Emily Filbert Treating Provider/Extender: Tito Dine in Treatment: 18 Subjective History of Present Illness (HPI) 85 year old patient who is known to the wound center for several years now has a new injury to the right lower extremity where she bumped herself against the bath tub. He also has had a chronic right lateral malleolus open wound which has been coming on and off for several years now. This has been there for at least 2014 and was healed out in November 2016. In the past she has been seen by her cardiologist Dr. Kathlyn Sacramento in April 2016. Noninvasive vascular evaluation showed an ABI of 0.68 on the right and 0.85 on the left. 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 One-vessel runoff below the knee via the peroneal artery with short occlusion of the anterior tibial artery and reconstitution. No intervention performed. Most recently she was seen for a wound on her right lower extremity which was healed out by March 15 She is not a smoker and has had no diabetes mellitus. 07/29/2015 -- the appointment with her cardiologist Dr. Fletcher Anon is still pending for review of her arterial duplex studies. Readmission: 03/19/18 on evaluation today  patient presents for readmission concerning an issue she has been having with her left heel and left anterior lower extremity. She has a history of atrial fibrillation and had knee surgery for a total knee replacement three weeks ago today. Subsequently since being discharged from the hospital it was noted that she did have a left heel deep tissue injury as well the left anterior lower extremity ulcer. This has been present apparently for roughly 2 weeks. She does not appear to have any evidence of infection which is good news. She has been tolerating the dressing changes that have been performed it sounds as if Santyl is what has been used at least most recently. Obviously I think this is likely a good treatment choice for her. Especially in light of the fact that the wound on the distal lower extremity of the left leg appears to show signs of significant slough/eschar buildup which is starting to loosen up now that she is using the central but prior was fairly significant as far as how hard and black it was. She does have pain at both sites the open wound more than the deep tissue injury. Otherwise the patient does have a history of peripheral vascular disease, congestive heart failure, chronic kidney disease stage IV, and now the presence of a left artificial knee joint. 03/28/18 on evaluation today patient appears to be doing a little worse  in regard to the dorsal surface of her lower extremity. She has been tolerating the dressing changes without complication although she states there's a lot of pain at this location. Subsequently in regard to her left heel the show signs of continued to drain with maceration noted. I think that some of the dead tissue needs to be removed from the surface of the wound at the site to allow it to heal appropriately. 04/02/18 on evaluation today patient's wounds actually appear to be doing better at this point in regard to the dorsal wound on the anterior lower leg  as well as the heel ulcer. She does have an area of thicker eschar of the heel which I think actually needs to be loosened up I think cental or Iodoflex could be beneficial in this regard. Otherwise debridement will likely be undertaken today over the lower leg region in order to clear away some of the necrotic material. 04/09/18 on evaluation today a lot of the skin which is around the patient's heel ulcer actually is lifting off which is good news. There is some Slough noted on the surface of one centrally this is going to require sharp debridement. The dorsal surface of her foot wound actually appears to be doing much better which is great news. Overall I'm fairly pleased with how things seem to be progressing. The patient likewise is worried about her heel but otherwise feels like things are going fairly well. 04/23/18 on evaluation today patient appears to be doing fairly well in regard to her ulcer. She has been tolerating the dressing changes with the Santyl and overall I do believe this has been of benefit. With that being said she is still having a lot of issues with pain on the dorsal aspect of her foot although I do believe she's slowly making progress which is good news. No fevers, chills, nausea, or vomiting noted at this time. DARLYNE, SCHMIESING ([id number]) 04/30/18 on evaluation today patient unfortunately appears to be doing a little bit worse I'm concerned about cellulitis around her wounds. Fortunately there does not appear to be any evidence of infection at this time which is good news. That is systemically. Unfortunately she does have increased pain. 05/07/18 on evaluation today patient appears to be doing decently well in regard to her wounds at this point. With that being said she tells me that she had a fall where she actually struck her head on Saturday roughly 72 hours ago. With that being said she doesn't note any dizziness or confusion but has had a headache. She has continued to  take the Keflex which was prescribed for her. She is on Eliquis which obviously is somewhat of a concern. The other issue is that upon evaluation today she seemed to have a very interesting issue going on with her blood pressure we were able to obtain a systolic blood pressure manually though the diastolic pressure was around 32 with the machine and when I took this manually I could hear the diastolic sounds even when the cough was completely deflated and in fact even when it was removed from her arm. This was a phenomenon that I had not seen previous. For this reason I was concerned enough especially with a history of head trauma to contact her cardiologist, Dr. Sophronia Simas, however he is out of the office at this point due to his wife having had a baby. Nonetheless I did end up speaking with the cardiologist on call and his recommendation was more or less  an echo of what I was concerned about which was the patient should likely go to the hospital to have things checked out especially with her history of anticoagulant therapy. 05/14/18 on evaluation today patient's wounds both in regard to the heel and her lower leg appear to be doing better especially in overall appearance. Fortunately there does not appear to be any evidence of infection at this time although unfortunately she tells me she's been having some discomfort over the past week with the dressing changes. We have been using Iodoflex. Overall I do think it's helped improve the quality of the wound although I'm not sure if her discomfort is going to be a limiting factor here. 05/23/2018 Seen today for follow-up and management of left heel and left lower leg wound. Today both wounds have a pretty significant amount of soft on the surface of the wound with surrounding callus. She has expressed in the past of having some discomfort and was hesitant with debridement today. However after explanation and allowing her to express her concerns and address  her question she is willing to have the wounds debrided. Her husband has been assisting with the dressing changes. She did mention today that she has chronic back pain and uses OxyContin to help with that pain. No s/s of infection. Denies any recent fevers, increased pain, chills, or shortness of breath during visit today. 06/20/18 on evaluation today patient actually appears to be doing better in regard to her left lower extremity and heel wounds. Currently the patient has been doing very well and overall seems to be progressing quite nicely in my opinion. In fact there is a lot of new skin growth that appears to be doing very well upon evaluation today. 4/6; 1 month follow-up left lower extremity and left heel. Left heel is closed. She still complains of pain in the left heel however. Objective Constitutional Sitting or standing Blood Pressure is within target range for patient.. Pulse regular and within target range for patient.Marland Kitchen Respirations regular, non-labored and within target range.. Temperature is normal and within the target range for the patient.Marland Kitchen appears in no distress. Vitals Time Taken: 3:38 PM, Height: 68 in, Weight: 121 lbs, BMI: 18.4, Temperature: 97.9 F, Pulse: 63 bpm, Respiratory Rate: 16 breaths/min, Blood Pressure: 132/40 mmHg. General Notes: Wound exam; the left leg wound looks satisfactory. No debridement is required. The left heel is closed. Integumentary (Hair, Skin) Barritt, Calandra A. ([id number]) Wound #15 status is Open. Original cause of wound was Shear/Friction. The wound is located on the [facility] Lower Leg. The wound measures 1.4cm length x 0.5cm width x 0.1cm depth; 0.55cm^2 area and 0.055cm^3 volume. There is Fat Layer (Subcutaneous Tissue) Exposed exposed. There is no undermining noted. There is a medium amount of serous drainage noted. The wound margin is flat and intact. There is large (67-100%) pink granulation within the wound bed. There is a small  (1-33%) amount of necrotic tissue within the wound bed including Adherent Slough. The periwound skin appearance exhibited: Scarring. The periwound skin appearance did not exhibit: Callus, Crepitus, Excoriation, Induration, Rash, Dry/Scaly, Maceration, Atrophie Blanche, Cyanosis, Ecchymosis, Hemosiderin Staining, Mottled, Pallor, Rubor, Erythema. Periwound temperature was noted as No Abnormality. The periwound has tenderness on palpation. Wound #16 status is Healed - Epithelialized. Original cause of wound was Pressure Injury. The wound is located on the Left Calcaneus. The wound measures 0cm length x 0cm width x 0cm depth; 0cm^2 area and 0cm^3 volume. Assessment Active Problems ICD-10 Unspecified open wound, left lower leg,  initial encounter Non-pressure chronic ulcer of other part of left lower leg with fat layer exposed Pressure-induced deep tissue damage of left heel Peripheral vascular disease, unspecified Chronic combined systolic (congestive) and diastolic (congestive) heart failure Chronic kidney disease, stage 4 (severe) Presence of left artificial knee joint Plan Wound Cleansing: Wound #15 Left,Distal,Anterior Lower Leg: Cleanse wound with mild soap and water Primary Wound Dressing: Wound #15 Left,Distal,Anterior Lower Leg: Silver Collagen Secondary Dressing: Wound #15 Left,Distal,Anterior Lower Leg: Boardered Foam Dressing Dressing Change Frequency: Wound #15 Left,Distal,Anterior Lower Leg: Change dressing every other day. Follow-up Appointments: Wound #15 Left,Distal,Anterior Lower Leg: Return Appointment in 3 weeks. 1. I have suggested padding the left heel. She is not wearing supportive footwear and there is been loss of subcutaneous fat 2. We will continue with silver collagen and border foam on the left anterior lower leg which really looks smaller and satisfactory EMUNAH, TEXIDOR ([id number]) Electronic Signature(s) Signed: 07/25/2018 6:06:34 PM By: Linton Ham  MD Entered By: Linton Ham on 07/25/2018 17:51:16 Vandeusen, [name] ([id number]) -------------------------------------------------------------------------------- SuperBill Details Patient Name: [name] Date of Service: 07/25/2018 Medical Record Number: [id number] Patient Account Number: [id number] Date of Birth/Sex: 1932-12-25 (85 y.o. F) Treating RN: Cornell Barman Primary Care Provider: Emily Filbert Other Clinician: Referring Provider: Emily Filbert Treating Provider/Extender: Tito Dine in Treatment: 18 Diagnosis Coding ICD-10 Codes Code Description [phone] Unspecified open wound, left lower leg, initial encounter L97.822 Non-pressure chronic ulcer of other part of left lower leg with fat layer exposed L89.626 Pressure-induced deep tissue damage of left heel I73.9 Peripheral vascular disease, unspecified I50.42 Chronic combined systolic (congestive) and diastolic (congestive) heart failure N18.4 Chronic kidney disease, stage 4 (severe) B04.888 Presence of left artificial knee joint Facility Procedures CPT4 Code: [id number] Description: 88828 - WOUND CARE VISIT-LEV 2 EST PT Modifier: Quantity: 1 Physician Procedures CPT4 Code Description: [id number] - WC PHYS LEVEL 2 - EST PT ICD-10 Diagnosis Description 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

## 2018-08-08 ENCOUNTER — Other Ambulatory Visit: Payer: Self-pay | Admitting: Orthopedic Surgery

## 2018-08-08 DIAGNOSIS — S32040G Wedge compression fracture of fourth lumbar vertebra, subsequent encounter for fracture with delayed healing: Secondary | ICD-10-CM

## 2018-08-13 ENCOUNTER — Ambulatory Visit
Admission: RE | Admit: 2018-08-13 | Discharge: 2018-08-13 | Disposition: A | Discharge status: Home / Self Care | Payer: Medicare HMO | Source: Ambulatory Visit | Attending: Orthopedic Surgery | Admitting: Orthopedic Surgery

## 2018-08-13 ENCOUNTER — Other Ambulatory Visit: Payer: Self-pay

## 2018-08-13 DIAGNOSIS — S32040G Wedge compression fracture of fourth lumbar vertebra, subsequent encounter for fracture with delayed healing: Secondary | ICD-10-CM | POA: Diagnosis not present

## 2018-08-15 ENCOUNTER — Other Ambulatory Visit: Payer: Self-pay

## 2018-08-15 ENCOUNTER — Encounter: Payer: Medicare HMO | Admitting: Physician Assistant

## 2018-08-15 DIAGNOSIS — S81802A Unspecified open wound, left lower leg, initial encounter: Secondary | ICD-10-CM | POA: Diagnosis not present

## 2018-08-15 NOTE — Progress Notes (Signed)
[name], [name] ([id number]) Visit Report for 08/15/2018 Chief Complaint Document Details Patient Name: [name], [name]. Date of Service: 08/15/2018 11:00 AM Medical Record Number: [id number] Patient Account Number: [id number] Date of Birth/Sex: [birth year] (85 y.o. F) Treating RN: Cornell Barman Primary Care Provider: Emily Filbert Other Clinician: Referring Provider: Emily Filbert Treating Provider/Extender: Melburn Hake, Jonnelle Lawniczak Weeks in Treatment: 21 Information Obtained from: Patient Chief Complaint Left heel DTI and left LE ulcer Electronic Signature(s) Signed: 08/15/2018 3:36:10 PM By: Worthy Keeler PA-C Entered By: Worthy Keeler on 08/15/2018 11:07:45 [name], [name] ([id number]) -------------------------------------------------------------------------------- Debridement Details Patient Name: [name]. Date of Service: 08/15/2018 11:00 AM Medical Record Number: [id number] Patient Account Number: [id number] Date of Birth/Sex: 1932-07-08 (85 y.o. F) Treating RN: Cornell Barman Primary Care Provider: Emily Filbert Other Clinician: Referring Provider: Emily Filbert Treating Provider/Extender: Melburn Hake, Blaize Nipper Weeks in Treatment: 21 Debridement Performed for Wound #15 Left,Distal,Anterior Lower Leg Assessment: Performed By: Physician STONE III, Levaughn Puccinelli E., PA-C Debridement Type: Debridement Level of Consciousness (Pre- Awake and Alert procedure): Pre-procedure Verification/Time Yes - 11:11 Out Taken: Start Time: 11:11 Pain Control: Lidocaine Total Area Debrided (L x W): 0.2 (cm) x 0.2 (cm) = 0.04 (cm) Tissue and other material Non-Viable debrided: Level: Non-Viable Tissue Debridement Description: Selective/Open Wound Instrument: Curette Bleeding: None End Time: 11:12 Response to Treatment: Procedure was tolerated well Level of Consciousness Awake and Alert (Post-procedure): Post Debridement Measurements of Total Wound Length: (cm) 0.7 Width: (cm) 0.2 Depth: (cm) 0.1 Volume: (cm)  0.011 Character of Wound/Ulcer Post Debridement: Stable Post Procedure Diagnosis Same as Pre-procedure Electronic Signature(s) Signed: 08/15/2018 3:36:10 PM By: Worthy Keeler PA-C Signed: 08/15/2018 4:37:26 PM By: Gretta Cool, BSN, RN, CWS, Kim RN, BSN Entered By: Gretta Cool, BSN, RN, CWS, Kim on 08/15/2018 11:12:24 [name], [name] ([id number]) -------------------------------------------------------------------------------- HPI Details Patient Name: [name]. Date of Service: 08/15/2018 11:00 AM Medical Record Number: [id number] Patient Account Number: [id number] Date of Birth/Sex: 28-Feb-1933 (85 y.o. F) Treating RN: Cornell Barman Primary Care Provider: Emily Filbert Other Clinician: Referring Provider: Emily Filbert Treating Provider/Extender: Melburn Hake, Maison Kestenbaum Weeks in Treatment: 21 History of Present Illness HPI Description: 85 year old patient who is known to the wound center for several years now has a new injury to the right lower extremity where she bumped herself against the bath tub. He also has had a chronic right lateral malleolus open wound which has been coming on and off for several years now. This has been there for at least 2014 and was healed out in November 2016. In the past she has been seen by her cardiologist Dr. Kathlyn Sacramento in April 2016. Noninvasive vascular evaluation showed an ABI of 0.68 on the right and 0.85 on the left. 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 One-vessel runoff below the knee via the peroneal artery with short occlusion of the anterior tibial artery and reconstitution. No intervention performed. Most recently she was seen for a wound on her right lower extremity which was healed out by March 15 She is not a smoker and has had no diabetes mellitus. 07/29/2015 -- the appointment with her cardiologist Dr. Fletcher Anon is still pending for review of her arterial duplex  studies. Readmission: 03/19/18 on evaluation today patient presents for readmission concerning an issue she has been having with her left heel and left anterior lower extremity. She has a history of atrial fibrillation and had knee surgery for a total knee replacement three weeks ago today.  Subsequently since being discharged from the hospital it was noted that she did have a left heel deep tissue injury as well the left anterior lower extremity ulcer. This has been present apparently for roughly 2 weeks. She does not appear to have any evidence of infection which is good news. She has been tolerating the dressing changes that have been performed it sounds as if Santyl is what has been used at least most recently. Obviously I think this is likely a good treatment choice for her. Especially in light of the fact that the wound on the distal lower extremity of the left leg appears to show signs of significant slough/eschar buildup which is starting to loosen up now that she is using the central but prior was fairly significant as far as how hard and black it was. She does have pain at both sites the open wound more than the deep tissue injury. Otherwise the patient does have a history of peripheral vascular disease, congestive heart failure, chronic kidney disease stage IV, and now the presence of a left artificial knee joint. 03/28/18 on evaluation today patient appears to be doing a little worse in regard to the dorsal surface of her lower extremity. She has been tolerating the dressing changes without complication although she states there's a lot of pain at this location. Subsequently in regard to her left heel the show signs of continued to drain with maceration noted. I think that some of the dead tissue needs to be removed from the surface of the wound at the site to allow it to heal appropriately. 04/02/18 on evaluation today patient's wounds actually appear to be doing better at this point in  regard to the dorsal wound on the anterior lower leg as well as the heel ulcer. She does have an area of thicker eschar of the heel which I think actually needs to be loosened up I think cental or Iodoflex could be beneficial in this regard. Otherwise debridement will likely be undertaken today over the lower leg region in order to clear away some of the necrotic material. 04/09/18 on evaluation today a lot of the skin which is around the patient's heel ulcer actually is lifting off which is good news. There is some Slough noted on the surface of one centrally this is going to require sharp debridement. The dorsal surface of her foot wound actually appears to be doing much better which is great news. Overall I'm fairly pleased with how things seem to be progressing. The patient likewise is worried about her heel but otherwise feels like things are going fairly well. 04/23/18 on evaluation today patient appears to be doing fairly well in regard to her ulcer. She has been tolerating the dressing changes with the Santyl and overall I do believe this has been of benefit. With that being said she is still having a lot of issues with pain on the dorsal aspect of her foot although I do believe she's slowly making progress which is good news. No fevers, chills, nausea, or vomiting noted at this time. DENIQUA, PERRY ([id number]) 04/30/18 on evaluation today patient unfortunately appears to be doing a little bit worse I'm concerned about cellulitis around her wounds. Fortunately there does not appear to be any evidence of infection at this time which is good news. That is systemically. Unfortunately she does have increased pain. 05/07/18 on evaluation today patient appears to be doing decently well in regard to her wounds at this point. With that being said  she tells me that she had a fall where she actually struck her head on Saturday roughly 72 hours ago. With that being said she doesn't note any dizziness or  confusion but has had a headache. She has continued to take the Keflex which was prescribed for her. She is on Eliquis which obviously is somewhat of a concern. The other issue is that upon evaluation today she seemed to have a very interesting issue going on with her blood pressure we were able to obtain a systolic blood pressure manually though the diastolic pressure was around 32 with the machine and when I took this manually I could hear the diastolic sounds even when the cough was completely deflated and in fact even when it was removed from her arm. This was a phenomenon that I had not seen previous. For this reason I was concerned enough especially with a history of head trauma to contact her cardiologist, Dr. Sophronia Simas, however he is out of the office at this point due to his wife having had a baby. Nonetheless I did end up speaking with the cardiologist on call and his recommendation was more or less an echo of what I was concerned about which was the patient should likely go to the hospital to have things checked out especially with her history of anticoagulant therapy. 05/14/18 on evaluation today patient's wounds both in regard to the heel and her lower leg appear to be doing better especially in overall appearance. Fortunately there does not appear to be any evidence of infection at this time although unfortunately she tells me she's been having some discomfort over the past week with the dressing changes. We have been using Iodoflex. Overall I do think it's helped improve the quality of the wound although I'm not sure if her discomfort is going to be a limiting factor here. 05/23/2018 Seen today for follow-up and management of left heel and left lower leg wound. Today both wounds have a pretty significant amount of soft on the surface of the wound with surrounding callus. She has expressed in the past of having some discomfort and was hesitant with debridement today. However after explanation  and allowing her to express her concerns and address her question she is willing to have the wounds debrided. Her husband has been assisting with the dressing changes. She did mention today that she has chronic back pain and uses OxyContin to help with that pain. No s/s of infection. Denies any recent fevers, increased pain, chills, or shortness of breath during visit today. 06/20/18 on evaluation today patient actually appears to be doing better in regard to her left lower extremity and heel wounds. Currently the patient has been doing very well and overall seems to be progressing quite nicely in my opinion. In fact there is a lot of new skin growth that appears to be doing very well upon evaluation today. 4/6; 1 month follow-up left lower extremity and left heel. Left heel is closed. She still complains of pain in the left heel however. 08/15/18 on evaluation today patient actually appears to be doing very well in regard to her left lower leg ulcer. She's been tolerating the Prisma without any complications. Fortunately there's no signs of active infection. Electronic Signature(s) Signed: 08/15/2018 3:36:10 PM By: Worthy Keeler PA-C Entered By: Worthy Keeler on 08/15/2018 11:46:44 [name], [name] ([id number]) -------------------------------------------------------------------------------- Physical Exam Details Patient Name: [name]. Date of Service: 08/15/2018 11:00 AM Medical Record Number: [id number] Patient Account Number: [id number]  Date of Birth/Sex: 07-21-1932 (85 y.o. F) Treating RN: Cornell Barman Primary Care Provider: Emily Filbert Other Clinician: Referring Provider: Emily Filbert Treating Provider/Extender: Melburn Hake, Thimothy Barretta Weeks in Treatment: 21 Constitutional Well-nourished and well-hydrated in no acute distress. Respiratory normal breathing without difficulty. Psychiatric this patient is able to make decisions and demonstrates good insight into disease process. Alert and Oriented  x 3. pleasant and cooperative. Notes I did have to perform some sharp debridement today to clear away some of the dry skin around the edge of the wound as well section A. She tolerated this without complication post debridement wound bed appears to be doing much better which is excellent news. Overall very pleased in this regard. Electronic Signature(s) Signed: 08/15/2018 3:36:10 PM By: Worthy Keeler PA-C Entered By: Worthy Keeler on 08/15/2018 11:47:16 [name], [name] ([id number]) -------------------------------------------------------------------------------- Physician Orders Details Patient Name: [name] Date of Service: 08/15/2018 11:00 AM Medical Record Number: [id number] Patient Account Number: [id number] Date of Birth/Sex: 08/07/1932 (85 y.o. F) Treating RN: Cornell Barman Primary Care Provider: Emily Filbert Other Clinician: Referring Provider: Emily Filbert Treating Provider/Extender: Melburn Hake, Oluwatobiloba Martin Weeks in Treatment: 8 Verbal / Phone Orders: No Diagnosis Coding ICD-10 Coding Code Description S81.802A Unspecified open wound, left lower leg, initial encounter L97.822 Non-pressure chronic ulcer of other part of left lower leg with fat layer exposed L89.626 Pressure-induced deep tissue damage of left heel I73.9 Peripheral vascular disease, unspecified I50.42 Chronic combined systolic (congestive) and diastolic (congestive) heart failure N18.4 Chronic kidney disease, stage 4 (severe) E70.350 Presence of left artificial knee joint Wound Cleansing Wound #15 Left,Distal,Anterior Lower Leg o Cleanse wound with mild soap and water Primary Wound Dressing Wound #15 Left,Distal,Anterior Lower Leg o Silver Collagen Secondary Dressing Wound #15 Left,Distal,Anterior Lower Leg o Boardered Foam Dressing Dressing Change Frequency Wound #15 Left,Distal,Anterior Lower Leg o Change dressing every other day. Follow-up Appointments Wound #15 Left,Distal,Anterior Lower Leg o  Return Appointment in 3 weeks. Electronic Signature(s) Signed: 08/15/2018 3:36:10 PM By: Worthy Keeler PA-C Signed: 08/15/2018 4:37:26 PM By: Gretta Cool, BSN, RN, CWS, Kim RN, BSN Entered By: Gretta Cool, BSN, RN, CWS, Kim on 08/15/2018 11:20:18 [name] ([id number]) -------------------------------------------------------------------------------- Problem List Details Patient Name: ERENDIRA, CRABTREE. Date of Service: 08/15/2018 11:00 AM Medical Record Number: [id number] Patient Account Number: [id number] Date of Birth/Sex: [birth year] (85 y.o. F) Treating RN: Cornell Barman Primary Care Provider: Emily Filbert Other Clinician: Referring Provider: Emily Filbert Treating Provider/Extender: Melburn Hake, Shannan Garfinkel Weeks in Treatment: 21 Active Problems ICD-10 Evaluated Encounter Code Description Active Date Today Diagnosis S81.802A Unspecified open wound, left lower leg, initial encounter 03/19/2018 No Yes L97.822 Non-pressure chronic ulcer of other part of left lower leg with 03/19/2018 No Yes fat layer exposed L89.626 Pressure-induced deep tissue damage of left heel 03/19/2018 No Yes I73.9 Peripheral vascular disease, unspecified 03/19/2018 No Yes I50.42 Chronic combined systolic (congestive) and diastolic 38/04/173 No Yes (congestive) heart failure N18.4 Chronic kidney disease, stage 4 (severe) 03/19/2018 No Yes Z96.652 Presence of left artificial knee joint 03/19/2018 No Yes Inactive Problems Resolved Problems Electronic Signature(s) Signed: 08/15/2018 3:36:10 PM By: Worthy Keeler PA-C Entered By: Worthy Keeler on 08/15/2018 11:07:33 [name], [name] ([id number]) -------------------------------------------------------------------------------- Progress Note/History and Physical Details Patient Name: [name]. Date of Service: 08/15/2018 11:00 AM Medical Record Number: [id number] Patient Account Number: [id number] Date of Birth/Sex: [birth year] (85 y.o. F) Treating RN: Cornell Barman Primary Care Provider: Emily Filbert  Other Clinician: Referring Provider: Emily Filbert Treating Provider/Extender: Melburn Hake, Lessa Huge Weeks  in Treatment: 21 Subjective Chief Complaint Information obtained from Patient Left heel DTI and left LE ulcer History of Present Illness (HPI) 85 year old patient who is known to the wound center for several years now has a new injury to the right lower extremity where she bumped herself against the bath tub. He also has had a chronic right lateral malleolus open wound which has been coming on and off for several years now. This has been there for at least 2014 and was healed out in November 2016. In the past she has been seen by her cardiologist Dr. Kathlyn Sacramento in April 2016. Noninvasive vascular evaluation showed an ABI of 0.68 on the right and 0.85 on the left. 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 One-vessel runoff below the knee via the peroneal artery with short occlusion of the anterior tibial artery and reconstitution. No intervention performed. Most recently she was seen for a wound on her right lower extremity which was healed out by March 15 She is not a smoker and has had no diabetes mellitus. 07/29/2015 -- the appointment with her cardiologist Dr. Fletcher Anon is still pending for review of her arterial duplex studies. Readmission: 03/19/18 on evaluation today patient presents for readmission concerning an issue she has been having with her left heel and left anterior lower extremity. She has a history of atrial fibrillation and had knee surgery for a total knee replacement three weeks ago today. Subsequently since being discharged from the hospital it was noted that she did have a left heel deep tissue injury as well the left anterior lower extremity ulcer. This has been present apparently for roughly 2 weeks. She does not appear to have any evidence of infection which is good news. She has been tolerating  the dressing changes that have been performed it sounds as if Santyl is what has been used at least most recently. Obviously I think this is likely a good treatment choice for her. Especially in light of the fact that the wound on the distal lower extremity of the left leg appears to show signs of significant slough/eschar buildup which is starting to loosen up now that she is using the central but prior was fairly significant as far as how hard and black it was. She does have pain at both sites the open wound more than the deep tissue injury. Otherwise the patient does have a history of peripheral vascular disease, congestive heart failure, chronic kidney disease stage IV, and now the presence of a left artificial knee joint. 03/28/18 on evaluation today patient appears to be doing a little worse in regard to the dorsal surface of her lower extremity. She has been tolerating the dressing changes without complication although she states there's a lot of pain at this location. Subsequently in regard to her left heel the show signs of continued to drain with maceration noted. I think that some of the dead tissue needs to be removed from the surface of the wound at the site to allow it to heal appropriately. 04/02/18 on evaluation today patient's wounds actually appear to be doing better at this point in regard to the dorsal wound on the anterior lower leg as well as the heel ulcer. She does have an area of thicker eschar of the heel which I think actually needs to be loosened up I think cental or Iodoflex could be beneficial in this regard. Otherwise debridement will likely be undertaken today over the lower  leg region in order to clear away some of the necrotic material. 04/09/18 on evaluation today a lot of the skin which is around the patient's heel ulcer actually is lifting off which is good news. There is some Slough noted on the surface of one centrally this is going to require sharp debridement.  The dorsal surface of her foot wound actually appears to be doing much better which is great news. Overall I'm fairly pleased with how things seem to be progressing. The patient likewise is worried about her heel but otherwise feels like things are going fairly well. [name], [name] ([id number]) 04/23/18 on evaluation today patient appears to be doing fairly well in regard to her ulcer. She has been tolerating the dressing changes with the Santyl and overall I do believe this has been of benefit. With that being said she is still having a lot of issues with pain on the dorsal aspect of her foot although I do believe she's slowly making progress which is good news. No fevers, chills, nausea, or vomiting noted at this time. 04/30/18 on evaluation today patient unfortunately appears to be doing a little bit worse I'm concerned about cellulitis around her wounds. Fortunately there does not appear to be any evidence of infection at this time which is good news. That is systemically. Unfortunately she does have increased pain. 05/07/18 on evaluation today patient appears to be doing decently well in regard to her wounds at this point. With that being said she tells me that she had a fall where she actually struck her head on Saturday roughly 72 hours ago. With that being said she doesn't note any dizziness or confusion but has had a headache. She has continued to take the Keflex which was prescribed for her. She is on Eliquis which obviously is somewhat of a concern. The other issue is that upon evaluation today she seemed to have a very interesting issue going on with her blood pressure we were able to obtain a systolic blood pressure manually though the diastolic pressure was around 32 with the machine and when I took this manually I could hear the diastolic sounds even when the cough was completely deflated and in fact even when it was removed from her arm. This was a phenomenon that I had not seen previous.  For this reason I was concerned enough especially with a history of head trauma to contact her cardiologist, Dr. Sophronia Simas, however he is out of the office at this point due to his wife having had a baby. Nonetheless I did end up speaking with the cardiologist on call and his recommendation was more or less an echo of what I was concerned about which was the patient should likely go to the hospital to have things checked out especially with her history of anticoagulant therapy. 05/14/18 on evaluation today patient's wounds both in regard to the heel and her lower leg appear to be doing better especially in overall appearance. Fortunately there does not appear to be any evidence of infection at this time although unfortunately she tells me she's been having some discomfort over the past week with the dressing changes. We have been using Iodoflex. Overall I do think it's helped improve the quality of the wound although I'm not sure if her discomfort is going to be a limiting factor here. 05/23/2018 Seen today for follow-up and management of left heel and left lower leg wound. Today both wounds have a pretty significant amount of soft on the surface  of the wound with surrounding callus. She has expressed in the past of having some discomfort and was hesitant with debridement today. However after explanation and allowing her to express her concerns and address her question she is willing to have the wounds debrided. Her husband has been assisting with the dressing changes. She did mention today that she has chronic back pain and uses OxyContin to help with that pain. No s/s of infection. Denies any recent fevers, increased pain, chills, or shortness of breath during visit today. 06/20/18 on evaluation today patient actually appears to be doing better in regard to her left lower extremity and heel wounds. Currently the patient has been doing very well and overall seems to be progressing quite nicely in my  opinion. In fact there is a lot of new skin growth that appears to be doing very well upon evaluation today. 4/6; 1 month follow-up left lower extremity and left heel. Left heel is closed. She still complains of pain in the left heel however. 08/15/18 on evaluation today patient actually appears to be doing very well in regard to her left lower leg ulcer. She's been tolerating the Prisma without any complications. Fortunately there's no signs of active infection. Patient History Information obtained from Patient. Family History Cancer - Siblings, Heart Disease - Father, Hypertension - Siblings, Stroke - Mother, No family history of Hereditary Spherocytosis, Kidney Disease, Lung Disease, Seizures, Thyroid Problems, Tuberculosis. Social History Never smoker, Marital Status - Married, Alcohol Use - Never, Drug Use - No History, Caffeine Use - Daily. Medical History Eyes Patient has history of Cataracts Denies history of Glaucoma, Optic Neuritis Ear/Nose/Mouth/Throat [name], [name] ([id number]) Denies history of Chronic sinus problems/congestion, Middle ear problems Hematologic/Lymphatic Patient has history of Anemia Denies history of Hemophilia, Human Immunodeficiency Virus, Lymphedema, Sickle Cell Disease Respiratory Denies history of Aspiration, Asthma, Chronic Obstructive Pulmonary Disease (COPD), Pneumothorax, Sleep Apnea, Tuberculosis Cardiovascular Patient has history of Arrhythmia - A fib, Hypertension Denies history of Angina, Congestive Heart Failure, Coronary Artery Disease, Hypotension, Myocardial Infarction, Peripheral Arterial Disease, Peripheral Venous Disease, Phlebitis, Vasculitis Gastrointestinal Denies history of Cirrhosis , Colitis, Crohn s, Hepatitis A, Hepatitis B, Hepatitis C Endocrine Denies history of Type I Diabetes, Type II Diabetes Genitourinary Denies history of End Stage Renal Disease Immunological Denies history of Lupus Erythematosus, Raynaud s,  Scleroderma Integumentary (Skin) Denies history of History of Burn, History of pressure wounds Musculoskeletal Patient has history of Osteoarthritis Denies history of Gout, Rheumatoid Arthritis Neurologic Denies history of Dementia, Neuropathy, Quadriplegia, Paraplegia, Seizure Disorder Oncologic Patient has history of Received Radiation Denies history of Received Chemotherapy Psychiatric Denies history of Anorexia/bulimia, Confinement Anxiety Hospitalization/Surgery History - (R) knee replacement. Medical And Surgical History Notes Ear/Nose/Mouth/Throat HOH- hearing aids Genitourinary frequent UTIs Oncologic Breast Ca- R mastectomy Review of Systems (ROS) Constitutional Symptoms (General Health) Denies complaints or symptoms of Fatigue, Fever, Chills, Marked Weight Change. Respiratory Denies complaints or symptoms of Chronic or frequent coughs, Shortness of Breath. Cardiovascular Denies complaints or symptoms of Chest pain, LE edema. Psychiatric Denies complaints or symptoms of Anxiety, Claustrophobia. [name], [name] ([id number]) Objective Constitutional Well-nourished and well-hydrated in no acute distress. Vitals Time Taken: 10:57 AM, Height: 68 in, Weight: 121 lbs, BMI: 18.4, Temperature: 97.9 F, Pulse: 61 bpm, Respiratory Rate: 18 breaths/min, Blood Pressure: 127/63 mmHg. Respiratory normal breathing without difficulty. Psychiatric this patient is able to make decisions and demonstrates good insight into disease process. Alert and Oriented x 3. pleasant and cooperative. General Notes: I did have to perform  some sharp debridement today to clear away some of the dry skin around the edge of the wound as well section A. She tolerated this without complication post debridement wound bed appears to be doing much better which is excellent news. Overall very pleased in this regard. Integumentary (Hair, Skin) Wound #15 status is Open. Original cause of wound was Shear/Friction.  The wound is located on the [facility] Lower Leg. The wound measures 0.7cm length x 0.2cm width x 0.1cm depth; 0.11cm^2 area and 0.011cm^3 volume. There is Fat Layer (Subcutaneous Tissue) Exposed exposed. There is a medium amount of serous drainage noted. The wound margin is flat and intact. There is large (67-100%) pink granulation within the wound bed. There is a small (1-33%) amount of necrotic tissue within the wound bed including Adherent Slough. The periwound skin appearance exhibited: Scarring. The periwound skin appearance did not exhibit: Callus, Crepitus, Excoriation, Induration, Rash, Dry/Scaly, Maceration, Atrophie Blanche, Cyanosis, Ecchymosis, Hemosiderin Staining, Mottled, Pallor, Rubor, Erythema. Periwound temperature was noted as No Abnormality. The periwound has tenderness on palpation. Assessment Active Problems ICD-10 Unspecified open wound, left lower leg, initial encounter Non-pressure chronic ulcer of other part of left lower leg with fat layer exposed Pressure-induced deep tissue damage of left heel Peripheral vascular disease, unspecified Chronic combined systolic (congestive) and diastolic (congestive) heart failure Chronic kidney disease, stage 4 (severe) Presence of left artificial knee joint Procedures Wound #15 Pre-procedure diagnosis of Wound #15 is a Skin Tear located on the Left,Distal,Anterior Lower Leg . There was a Selective/Open Wound Non-Viable Tissue Debridement with a total area of 0.04 sq cm performed by STONE III, Servando Kyllonen E., Gatton, Brentlee A. ([id number]) PA-C. With the following instrument(s): Curette to remove Non-Viable tissue/material. after achieving pain control using Lidocaine. No specimens were taken. A time out was conducted at 11:11, prior to the start of the procedure. There was no bleeding. The procedure was tolerated well. Post Debridement Measurements: 0.7cm length x 0.2cm width x 0.1cm depth; 0.011cm^3 volume. Character of  Wound/Ulcer Post Debridement is stable. Post procedure Diagnosis Wound #15: Same as Pre-Procedure Plan Wound Cleansing: Wound #15 Left,Distal,Anterior Lower Leg: Cleanse wound with mild soap and water Primary Wound Dressing: Wound #15 Left,Distal,Anterior Lower Leg: Silver Collagen Secondary Dressing: Wound #15 Left,Distal,Anterior Lower Leg: Boardered Foam Dressing Dressing Change Frequency: Wound #15 Left,Distal,Anterior Lower Leg: Change dressing every other day. Follow-up Appointments: Wound #15 Left,Distal,Anterior Lower Leg: Return Appointment in 3 weeks. My suggestion currently is gonna be that we continue with the above wound care measures for the next week and the patient is in agreement with plan. If anything changes or worsens shall contact the office and let me know. Please see above for specific wound care orders. We will see patient for re-evaluation in 3 week(s) here in the clinic. If anything worsens or changes patient will contact our office for additional recommendations. Electronic Signature(s) Signed: 08/15/2018 3:36:10 PM By: Worthy Keeler PA-C Entered By: Worthy Keeler on 08/15/2018 11:47:31 Ostermiller, [name] ([id number]) -------------------------------------------------------------------------------- ROS/PFSH Details Patient Name: [name] Date of Service: 08/15/2018 11:00 AM Medical Record Number: [id number] Patient Account Number: [id number] Date of Birth/Sex: 1933-01-21 (85 y.o. F) Treating RN: Cornell Barman Primary Care Provider: Emily Filbert Other Clinician: Referring Provider: Emily Filbert Treating Provider/Extender: Melburn Hake, Shadi Larner Weeks in Treatment: 21 Label Progress Note Print Version as History and Physical for this encounter Information Obtained From Patient Constitutional Symptoms (General Health) Complaints and Symptoms: Negative for: Fatigue; Fever; Chills; Marked Weight Change Respiratory Complaints and Symptoms:  Negative for: Chronic or  frequent coughs; Shortness of Breath Medical History: Negative for: Aspiration; Asthma; Chronic Obstructive Pulmonary Disease (COPD); Pneumothorax; Sleep Apnea; Tuberculosis Cardiovascular Complaints and Symptoms: Negative for: Chest pain; LE edema Medical History: Positive for: Arrhythmia - A fib; Hypertension Negative for: Angina; Congestive Heart Failure; Coronary Artery Disease; Hypotension; Myocardial Infarction; Peripheral Arterial Disease; Peripheral Venous Disease; Phlebitis; Vasculitis Psychiatric Complaints and Symptoms: Negative for: Anxiety; Claustrophobia Medical History: Negative for: Anorexia/bulimia; Confinement Anxiety Eyes Medical History: Positive for: Cataracts Negative for: Glaucoma; Optic Neuritis Ear/Nose/Mouth/Throat Medical History: Negative for: Chronic sinus problems/congestion; Middle ear problems Past Medical History Notes: HOH- hearing aids Hematologic/Lymphatic [name], [name] ([id number]) Medical History: Positive for: Anemia Negative for: Hemophilia; Human Immunodeficiency Virus; Lymphedema; Sickle Cell Disease Gastrointestinal Medical History: Negative for: Cirrhosis ; Colitis; Crohnos; Hepatitis A; Hepatitis B; Hepatitis C Endocrine Medical History: Negative for: Type I Diabetes; Type II Diabetes Genitourinary Medical History: Negative for: End Stage Renal Disease Past Medical History Notes: frequent UTIs Immunological Medical History: Negative for: Lupus Erythematosus; Raynaudos; Scleroderma Integumentary (Skin) Medical History: Negative for: History of Burn; History of pressure wounds Musculoskeletal Medical History: Positive for: Osteoarthritis Negative for: Gout; Rheumatoid Arthritis Neurologic Medical History: Negative for: Dementia; Neuropathy; Quadriplegia; Paraplegia; Seizure Disorder Oncologic Medical History: Positive for: Received Radiation Negative for: Received Chemotherapy Past Medical History Notes: Breast Ca- R  mastectomy HBO Extended History Items Eyes: Cataracts Immunizations Pneumococcal Vaccine: Received Pneumococcal Vaccination: No Tetanus Vaccine: [name], LUCIEN ([id number]) Last tetanus shot: 08/15/2008 Implantable Devices No devices added Hospitalization / Surgery History Type of Hospitalization/Surgery (R) knee replacement Family and Social History Cancer: Yes - Siblings; Heart Disease: Yes - Father; Hereditary Spherocytosis: No; Hypertension: Yes - Siblings; Kidney Disease: No; Lung Disease: No; Seizures: No; Stroke: Yes - Mother; Thyroid Problems: No; Tuberculosis: No; Never smoker; Marital Status - Married; Alcohol Use: Never; Drug Use: No History; Caffeine Use: Daily; Financial Concerns: No; Food, Clothing or Shelter Needs: No; Support System Lacking: No; Transportation Concerns: No Physician Affirmation I have reviewed and agree with the above information. Electronic Signature(s) Signed: 08/15/2018 3:36:10 PM By: Worthy Keeler PA-C Signed: 08/15/2018 4:37:26 PM By: Gretta Cool BSN, RN, CWS, Kim RN, BSN Entered By: Worthy Keeler on 08/15/2018 11:47:02 Shively, [name] ([id number]) -------------------------------------------------------------------------------- SuperBill Details Patient Name: [name] Date of Service: 08/15/2018 Medical Record Number: [id number] Patient Account Number: [id number] Date of Birth/Sex: [birth year] (85 y.o. F) Treating RN: Cornell Barman Primary Care Provider: Emily Filbert Other Clinician: Referring Provider: Emily Filbert Treating Provider/Extender: Melburn Hake, Alphonso Gregson Weeks in Treatment: 21 Diagnosis Coding ICD-10 Codes Code Description [phone] Unspecified open wound, left lower leg, initial encounter L97.822 Non-pressure chronic ulcer of other part of left lower leg with fat layer exposed L89.626 Pressure-induced deep tissue damage of left heel I73.9 Peripheral vascular disease, unspecified I50.42 Chronic combined systolic (congestive) and diastolic  (congestive) heart failure N18.4 Chronic kidney disease, stage 4 (severe) P23.300 Presence of left artificial knee joint Facility Procedures CPT4 Code Description: [id number] - DEBRIDE WOUND 1ST 20 SQ CM OR < ICD-10 Diagnosis Description L97.822 Non-pressure chronic ulcer of other part of left lower leg with f Modifier: at layer expos Quantity: 1 ed Physician Procedures CPT4 Code Description: [id number] - WC PHYS DEBR WO ANESTH 20 SQ CM ICD-10 Diagnosis Description L97.822 Non-pressure chronic ulcer of other part of left lower leg with f Modifier: at layer expos Quantity: 1 ed Electronic Signature(s) Signed: 08/15/2018 3:36:10 PM By: Worthy Keeler PA-C Entered By: Worthy Keeler on  08/15/2018 11:47:55 

## 2018-08-15 NOTE — Progress Notes (Addendum)
[name], [name] ([id number]) Visit Report for 08/15/2018 Arrival Information Details Patient Name: [name], PORT. Date of Service: 08/15/2018 11:00 AM Medical Record Number: [id number] Patient Account Number: [id number] Date of Birth/Sex: October 30, 1932 (85 y.o. F) Treating RN: Harold Barban Primary Care Mahathi Pokorney: Emily Filbert Other Clinician: Referring Aadya Kindler: Emily Filbert Treating Kaho Selle/Extender: Melburn Hake, HOYT Weeks in Treatment: 21 Visit Information History Since Last Visit Added or deleted any medications: No Patient Arrived: Cane Any new allergies or adverse reactions: No Arrival Time: 10:57 Had a fall or experienced change in No Accompanied By: self activities of daily living that may affect Transfer Assistance: None risk of falls: Patient Identification Verified: Yes Signs or symptoms of abuse/neglect since last visito No Secondary Verification Process Yes Hospitalized since last visit: No Completed: Has Dressing in Place as Prescribed: Yes Patient Has Alerts: Yes Pain Present Now: No Patient Alerts: Patient on Blood Thinner Eliquis ABI 05/23/17 L.88 R 1.16 Electronic Signature(s) Signed: 08/15/2018 3:16:51 PM By: Harold Barban Entered By: Harold Barban on 08/15/2018 10:57:45 Duckett, [name] ([id number]) -------------------------------------------------------------------------------- Encounter Discharge Information Details Patient Name: [name]. Date of Service: 08/15/2018 11:00 AM Medical Record Number: [id number] Patient Account Number: [id number] Date of Birth/Sex: [birth year] (85 y.o. F) Treating RN: Cornell Barman Primary Care Kijuana Ruppel: Emily Filbert Other Clinician: Referring Amarien Carne: Emily Filbert Treating Vincent Ehrler/Extender: Melburn Hake, HOYT Weeks in Treatment: 21 Encounter Discharge Information Items Post Procedure Vitals Discharge Condition: Stable Temperature (F): 97.2 Ambulatory Status: Ambulatory Pulse (bpm): 61 Discharge Destination: Home Respiratory Rate  (breaths/min): 18 Transportation: Private Auto Blood Pressure (mmHg): 127/63 Accompanied By: self Schedule Follow-up Appointment: Yes Clinical Summary of Care: Electronic Signature(s) Signed: 08/15/2018 4:37:26 PM By: Gretta Cool, BSN, RN, CWS, Kim RN, BSN Entered By: Gretta Cool, BSN, RN, CWS, Kim on 08/15/2018 11:24:37 Iracheta, [name] ([id number]) -------------------------------------------------------------------------------- Lower Extremity Assessment Details Patient Name: [name]. Date of Service: 08/15/2018 11:00 AM Medical Record Number: [id number] Patient Account Number: [id number] Date of Birth/Sex: May 23, 1932 (85 y.o. F) Treating RN: Harold Barban Primary Care Tobe Kervin: Emily Filbert Other Clinician: Referring Zavannah Deblois: Emily Filbert Treating Harlie Ragle/Extender: Melburn Hake, HOYT Weeks in Treatment: 21 Vascular Assessment Pulses: Dorsalis Pedis Palpable: [Left:Yes] Posterior Tibial Palpable: [Left:Yes] Electronic Signature(s) Signed: 08/15/2018 3:16:51 PM By: Harold Barban Entered By: Harold Barban on 08/15/2018 11:06:24 Sedivy, [name] ([id number]) -------------------------------------------------------------------------------- Multi Wound Chart Details Patient Name: [name]. Date of Service: 08/15/2018 11:00 AM Medical Record Number: [id number] Patient Account Number: [id number] Date of Birth/Sex: 1932-12-25 (85 y.o. F) Treating RN: Cornell Barman Primary Care Gaberiel Youngblood: Emily Filbert Other Clinician: Referring Huong Luthi: Emily Filbert Treating Lathan Gieselman/Extender: Melburn Hake, HOYT Weeks in Treatment: 21 Vital Signs Height(in): 68 Pulse(bpm): 61 Weight(lbs): 121 Blood Pressure(mmHg): 127/63 Body Mass Index(BMI): 18 Temperature(F): 97.9 Respiratory Rate 18 (breaths/min): Photos: [N/A:N/A] Wound Location: Left Lower Leg - Anterior, N/A N/A Distal Wounding Event: Shear/Friction N/A N/A Primary Etiology: Skin Tear N/A N/A Comorbid History: Cataracts, Anemia, N/A N/A Arrhythmia,  Hypertension, Osteoarthritis, Received Radiation Date Acquired: 03/05/2018 N/A N/A Weeks of Treatment: 21 N/A N/A Wound Status: Open N/A N/A Measurements L x W x D 7x0.2x0.1 N/A N/A (cm) Area (cm) : 1.1 N/A N/A Volume (cm) : 0.11 N/A N/A % Reduction in Area: 92.50% N/A N/A % Reduction in Volume: 96.30% N/A N/A Classification: Full Thickness Without N/A N/A Exposed Support Structures Exudate Amount: Medium N/A N/A Exudate Type: Serous N/A N/A Exudate Color: amber N/A N/A Wound Margin: Flat and Intact N/A N/A Granulation Amount: Large (67-100%) N/A N/A Granulation Quality: Pink N/A N/A Necrotic  Amount: Small (1-33%) N/A N/A Exposed Structures: Fat Layer (Subcutaneous N/A N/A Tissue) Exposed: Yes Fascia: No Naef, Raven A. ([id number]) Tendon: No Muscle: No Joint: No Bone: No Epithelialization: Small (1-33%) N/A N/A Treatment Notes Electronic Signature(s) Signed: 08/15/2018 4:37:26 PM By: Gretta Cool, BSN, RN, CWS, Kim RN, BSN Entered By: Gretta Cool, BSN, RN, CWS, Kim on 08/15/2018 11:10:18 Basford, [name] ([id number]) -------------------------------------------------------------------------------- [place]co Details Patient Name: [name]. Date of Service: 08/15/2018 11:00 AM Medical Record Number: [id number] Patient Account Number: [id number] Date of Birth/Sex: 06-22-1932 (85 y.o. F) Treating RN: Cornell Barman Primary Care Donte Kary: Emily Filbert Other Clinician: Referring Ania Levay: Emily Filbert Treating Azarion Hove/Extender: Melburn Hake, HOYT Weeks in Treatment: 21 Active Inactive Electronic Signature(s) Signed: 10/02/2018 12:39:44 PM By: Gretta Cool, BSN, RN, CWS, Kim RN, BSN Previous Signature: 08/15/2018 4:37:26 PM Version By: Gretta Cool, BSN, RN, CWS, Kim RN, BSN Entered By: Gretta Cool, BSN, RN, CWS, Kim on 10/02/2018 12:39:44 Gamboa, [name] ([id number]) -------------------------------------------------------------------------------- Pain Assessment Details Patient Name: [name]. Date  of Service: 08/15/2018 11:00 AM Medical Record Number: [id number] Patient Account Number: [id number] Date of Birth/Sex: 14-Jul-1932 (85 y.o. F) Treating RN: Harold Barban Primary Care Naif Alabi: Emily Filbert Other Clinician: Referring Yulieth Carrender: Emily Filbert Treating Taejah Ohalloran/Extender: Melburn Hake, HOYT Weeks in Treatment: 21 Active Problems Location of Pain Severity and Description of Pain Patient Has Paino No Site Locations Pain Management and Medication Current Pain Management: Electronic Signature(s) Signed: 08/15/2018 3:16:51 PM By: Harold Barban Entered By: Harold Barban on 08/15/2018 10:57:52 Brimley, [name] ([id number]) -------------------------------------------------------------------------------- Patient/Caregiver Education Details Patient Name: [name]. Date of Service: 08/15/2018 11:00 AM Medical Record Number: [id number] Patient Account Number: [id number] Date of Birth/Gender: May 16, 1932 (85 y.o. F) Treating RN: Cornell Barman Primary Care Physician: Emily Filbert Other Clinician: Referring Physician: Emily Filbert Treating Physician/Extender: Sharalyn Ink in Treatment: 21 Education Assessment Education Provided To: Patient Education Topics Provided Wound/Skin Impairment: Handouts: Caring for Your Ulcer Methods: Demonstration, Explain/Verbal Responses: State content correctly Electronic Signature(s) Signed: 08/15/2018 4:37:26 PM By: Gretta Cool, BSN, RN, CWS, Kim RN, BSN Entered By: Gretta Cool, BSN, RN, CWS, Kim on 08/15/2018 11:21:01 Narayanan, [name] ([id number]) -------------------------------------------------------------------------------- Wound Assessment Details Patient Name: [name]. Date of Service: 08/15/2018 11:00 AM Medical Record Number: [id number] Patient Account Number: [id number] Date of Birth/Sex: 04/17/1933 (85 y.o. F) Treating RN: Cornell Barman Primary Care Yana Schorr: Emily Filbert Other Clinician: Referring Rebbeca Sheperd: Emily Filbert Treating Adorian Gwynne/Extender:  Melburn Hake, HOYT Weeks in Treatment: 21 Wound Status Wound Number: 15 Primary Skin Tear Etiology: Wound Location: Left, Distal, Anterior Lower Leg Wound Open Wounding Event: Shear/Friction Status: Date Acquired: 03/05/2018 Comorbid Cataracts, Anemia, Arrhythmia, Hypertension, Weeks Of Treatment: 21 History: Osteoarthritis, Received Radiation Clustered Wound: No Photos Wound Measurements Length: (cm) 0.7 Width: (cm) 0.2 Depth: (cm) 0.1 Area: (cm) 0.11 Volume: (cm) 0.011 % Reduction in Area: 99.3% % Reduction in Volume: 99.6% Epithelialization: Small (1-33%) Wound Description Full Thickness Without Exposed Support Foul Od Classification: Structures Slough/ Wound Margin: Flat and Intact Exudate Medium Amount: Exudate Type: Serous Exudate Color: amber or After Cleansing: No Fibrino Yes Wound Bed Granulation Amount: Large (67-100%) Exposed Structure Granulation Quality: Pink Fascia Exposed: No Necrotic Amount: Small (1-33%) Fat Layer (Subcutaneous Tissue) Exposed: Yes Necrotic Quality: Adherent Slough Tendon Exposed: No Muscle Exposed: No Joint Exposed: No Bone Exposed: No Periwound Skin Texture Volkert, Ebbie A. ([id number]) Texture Color No Abnormalities Noted: No No Abnormalities Noted: No Callus: No Atrophie Blanche: No Crepitus: No Cyanosis: No Excoriation: No Ecchymosis: No Induration: No Erythema: No Rash:  No Hemosiderin Staining: No Scarring: Yes Mottled: No Pallor: No Moisture Rubor: No No Abnormalities Noted: No Dry / Scaly: No Temperature / Pain Maceration: No Temperature: No Abnormality Tenderness on Palpation: Yes Electronic Signature(s) Signed: 08/15/2018 4:37:26 PM By: Gretta Cool, BSN, RN, CWS, Kim RN, BSN Entered By: Gretta Cool, BSN, RN, CWS, Kim on 08/15/2018 11:10:48 Mancha, [name] ([id number]) -------------------------------------------------------------------------------- Vitals Details Patient Name: [name]. Date of Service: 08/15/2018  11:00 AM Medical Record Number: [id number] Patient Account Number: [id number] Date of Birth/Sex: 1933/03/25 (85 y.o. F) Treating RN: Harold Barban Primary Care Brexlee Heberlein: Emily Filbert Other Clinician: Referring Maame Dack: Emily Filbert Treating Tristen Pennino/Extender: Melburn Hake, HOYT Weeks in Treatment: 21 Vital Signs Time Taken: 10:57 Temperature (F): 97.9 Height (in): 68 Pulse (bpm): 61 Weight (lbs): 121 Respiratory Rate (breaths/min): 18 Body Mass Index (BMI): 18.4 Blood Pressure (mmHg): 127/63 Reference Range: 80 - 120 mg / dl Electronic Signature(s) Signed: 08/15/2018 3:16:51 PM By: Harold Barban Entered By: Harold Barban on 08/15/2018 10:59:56

## 2018-08-29 ENCOUNTER — Ambulatory Visit: Payer: Medicare HMO | Admitting: Physician Assistant

## 2018-09-12 ENCOUNTER — Telehealth: Payer: Self-pay

## 2018-09-12 ENCOUNTER — Ambulatory Visit: Payer: Medicare HMO | Admitting: Physician Assistant

## 2018-09-12 NOTE — Telephone Encounter (Signed)
Patient returned call regarding scheduling overdue F/U with Dr. Fletcher Anon. Offered telephone or video visit however patient declined stating she is doing well other than having back problems. Patient states she will call our office if she needs cardiac care in the future. Advised patient that she will be considered a new patient if she is not seen within a 3 year period (02/09/2020). Patient expressed verbal understanding. Recall deleted.

## 2018-09-19 ENCOUNTER — Other Ambulatory Visit: Payer: Self-pay | Admitting: Adult Health

## 2018-09-24 ENCOUNTER — Other Ambulatory Visit: Payer: Self-pay | Admitting: Adult Health

## 2018-09-27 ENCOUNTER — Other Ambulatory Visit: Payer: Self-pay | Admitting: Adult Health

## 2018-10-03 ENCOUNTER — Other Ambulatory Visit: Payer: Self-pay | Admitting: Adult Health

## 2018-10-19 ENCOUNTER — Other Ambulatory Visit: Payer: Self-pay | Admitting: Adult Health

## 2018-10-29 ENCOUNTER — Other Ambulatory Visit: Payer: Self-pay | Admitting: Internal Medicine

## 2018-10-29 DIAGNOSIS — Z1231 Encounter for screening mammogram for malignant neoplasm of breast: Secondary | ICD-10-CM

## 2018-11-11 ENCOUNTER — Other Ambulatory Visit: Payer: Self-pay | Admitting: Adult Health

## 2019-01-01 ENCOUNTER — Other Ambulatory Visit: Payer: Self-pay | Admitting: Adult Health

## 2019-01-06 ENCOUNTER — Other Ambulatory Visit: Payer: Self-pay | Admitting: Adult Health

## 2019-01-08 ENCOUNTER — Other Ambulatory Visit: Payer: Self-pay | Admitting: Adult Health

## 2019-01-15 ENCOUNTER — Ambulatory Visit
Admission: RE | Admit: 2019-01-15 | Discharge: 2019-01-15 | Disposition: A | Discharge status: Home / Self Care | Payer: Medicare HMO | Source: Ambulatory Visit | Attending: Internal Medicine | Admitting: Internal Medicine

## 2019-01-15 DIAGNOSIS — Z1231 Encounter for screening mammogram for malignant neoplasm of breast: Secondary | ICD-10-CM | POA: Diagnosis not present

## 2019-01-30 ENCOUNTER — Other Ambulatory Visit: Payer: Self-pay | Admitting: Adult Health

## 2019-02-18 ENCOUNTER — Other Ambulatory Visit: Payer: Self-pay | Admitting: Adult Health

## 2019-03-30 IMAGING — MG MM DIGITAL SCREENING UNILAT*L* W/ TOMO W/ CAD
7 series · 9 of 15 positions shown · non-contrast
Comparison: Previous exam(s).

CLINICAL DATA: Screening.

EXAM:
2D DIGITAL SCREENING UNILATERAL LEFT MAMMOGRAM WITH CAD AND ADJUNCT
TOMO

[L XCCL]
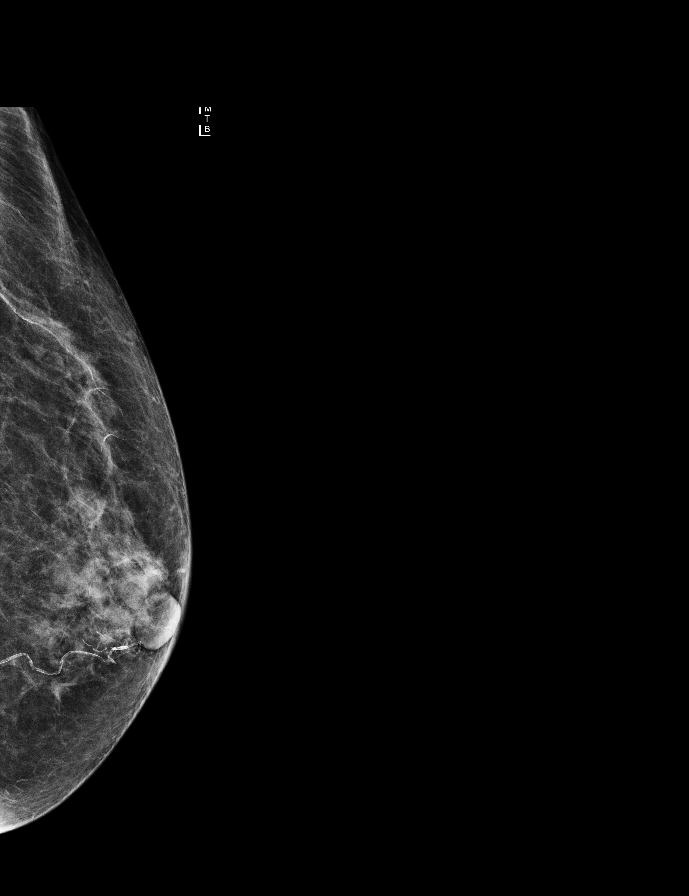

[L MLO synth-2D]
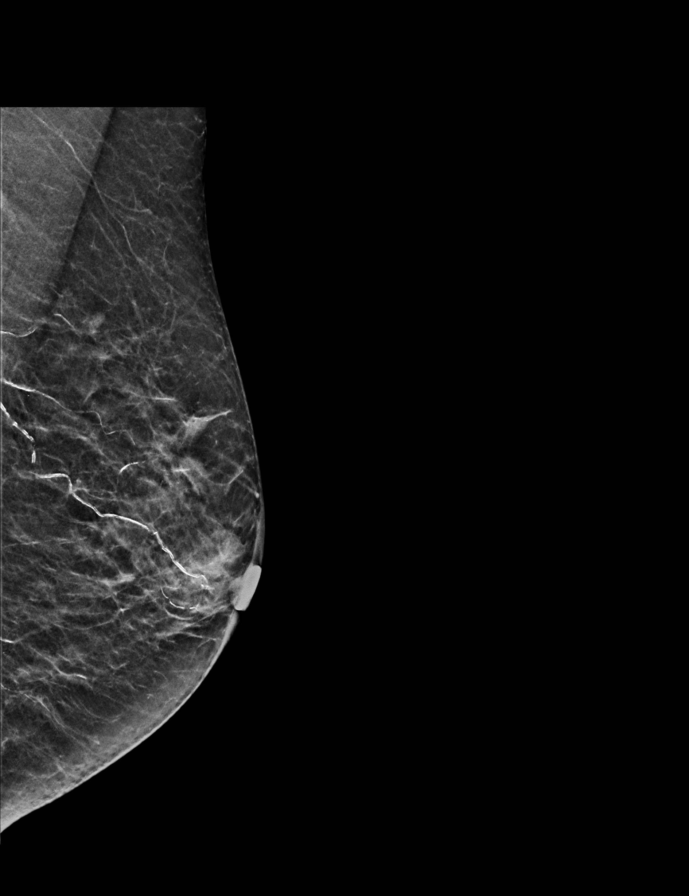

[L CC]
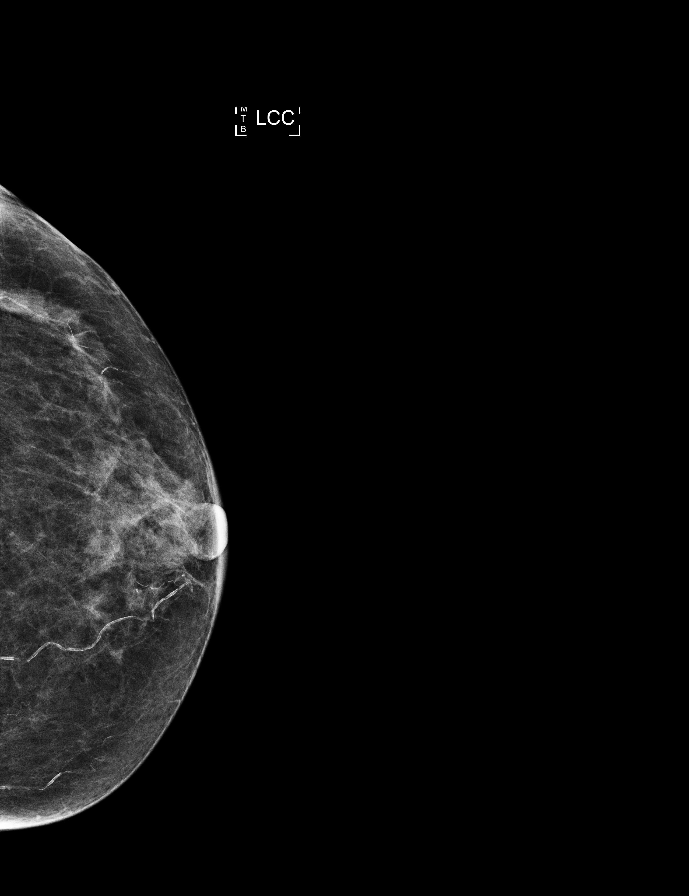

[L MLO]
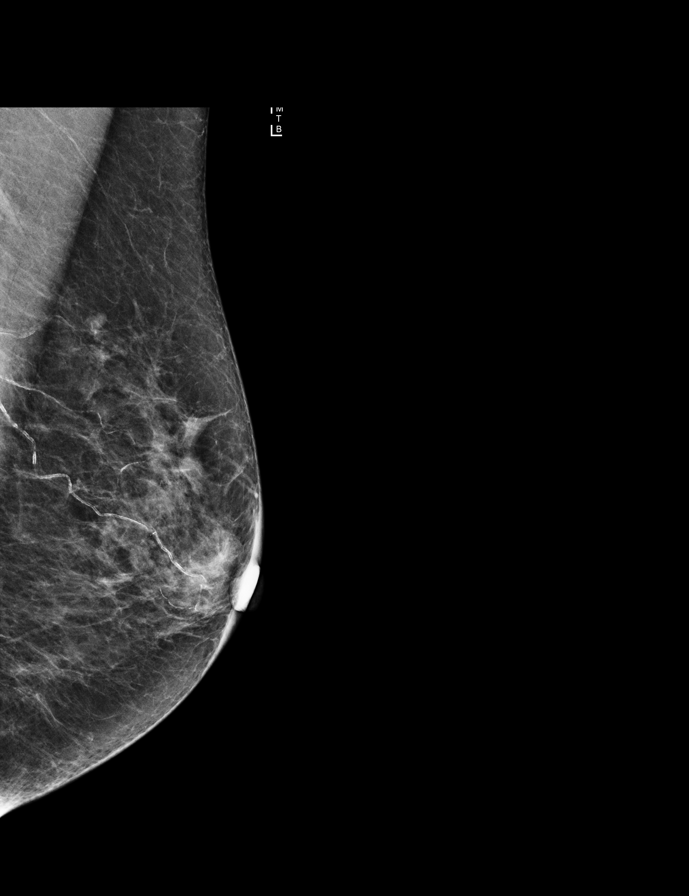

[L CC synth-2D]
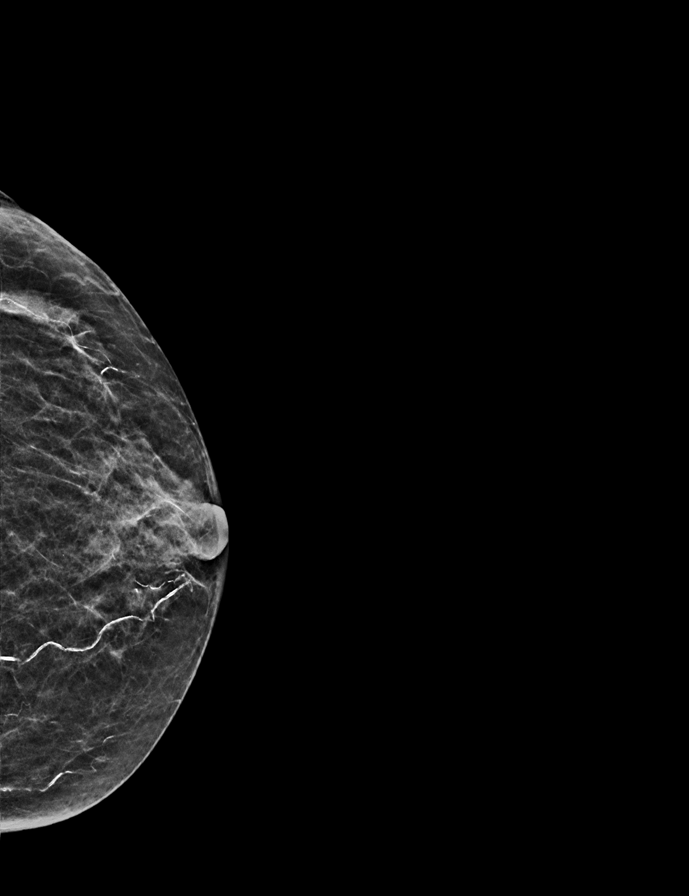

[L CC tomo · 3 of 33 frames shown]
[frame 11/33]
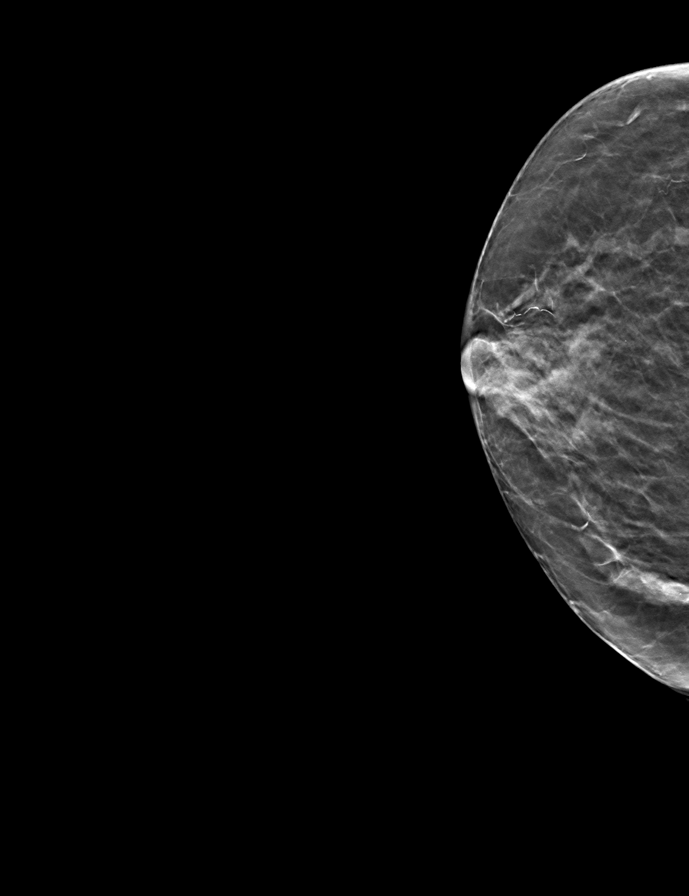
[frame 17/33]
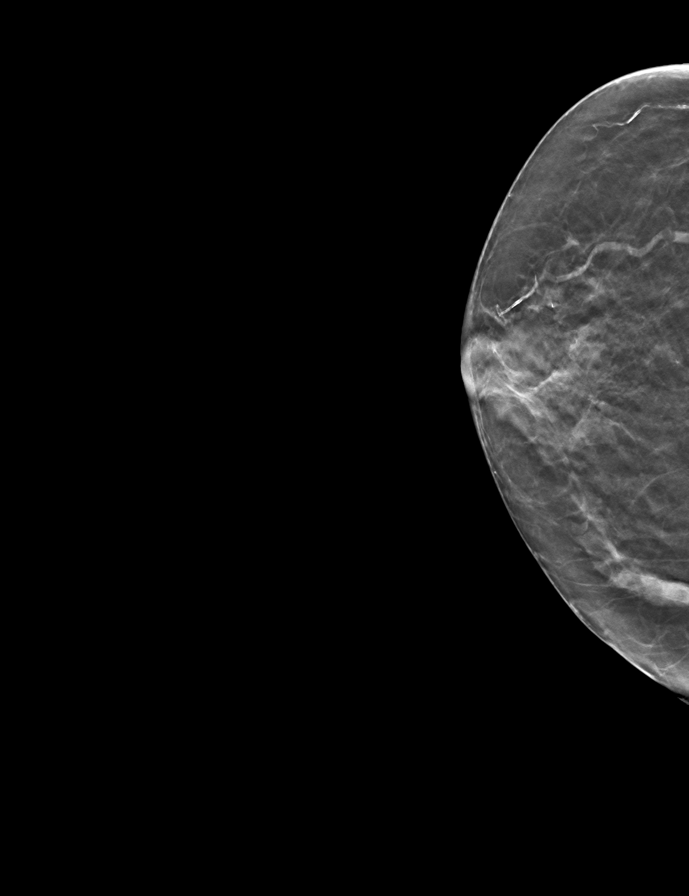
[frame 23/33]
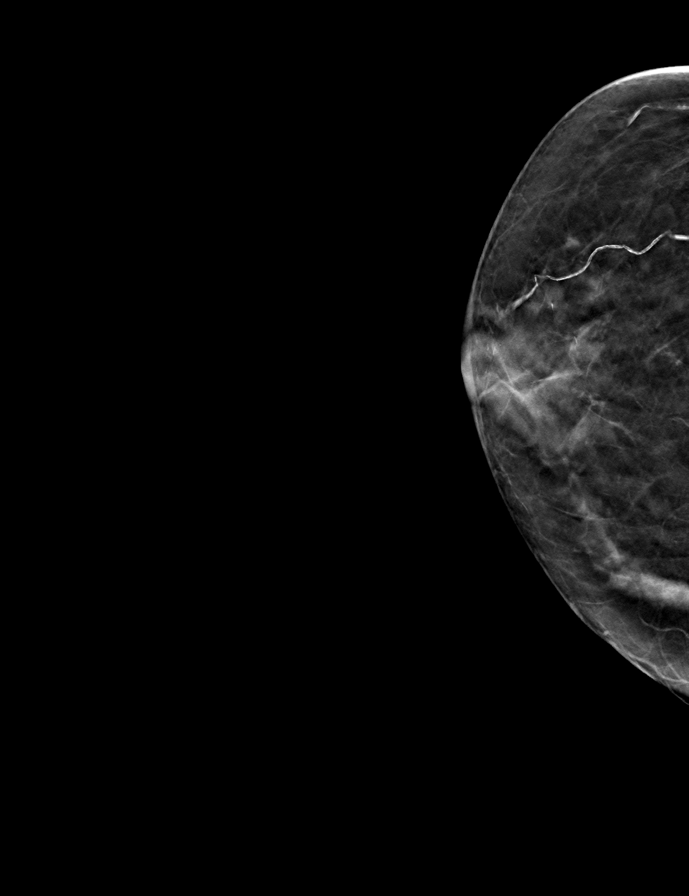

[L MLO tomo · tomo slice 19/36.0]
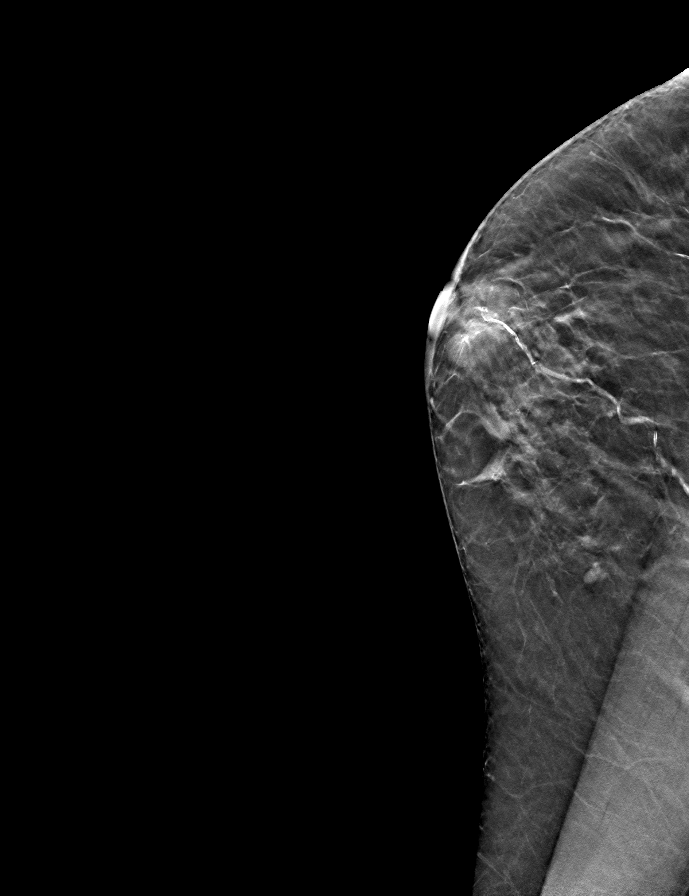

[9 of 15 positions shown; findings below may reference images not displayed]

ACR Breast Density Category c: The breast tissue is heterogeneously
dense, which may obscure small masses.
FINDINGS: The patient has had a right mastectomy. There are no findings
suspicious for malignancy.

Images were processed with CAD.
IMPRESSION: No mammographic evidence of malignancy. A result letter of this
screening mammogram will be mailed directly to the patient.

RECOMMENDATION:
Screening mammogram in one year.  (Code:CU-D-8CU)

BI-RADS CATEGORY  1: Negative.

## 2019-10-26 ENCOUNTER — Emergency Department
Admission: EM | Admit: 2019-10-26 | Discharge: 2019-10-26 | Disposition: A | Discharge status: Home / Self Care | Payer: Medicare HMO | Attending: Emergency Medicine | Admitting: Emergency Medicine

## 2019-10-26 ENCOUNTER — Encounter: Payer: Self-pay | Admitting: Emergency Medicine

## 2019-10-26 ENCOUNTER — Emergency Department: Payer: Medicare HMO

## 2019-10-26 ENCOUNTER — Other Ambulatory Visit: Payer: Self-pay

## 2019-10-26 DIAGNOSIS — Z96653 Presence of artificial knee joint, bilateral: Secondary | ICD-10-CM | POA: Diagnosis not present

## 2019-10-26 DIAGNOSIS — Y939 Activity, unspecified: Secondary | ICD-10-CM | POA: Insufficient documentation

## 2019-10-26 DIAGNOSIS — I129 Hypertensive chronic kidney disease with stage 1 through stage 4 chronic kidney disease, or unspecified chronic kidney disease: Secondary | ICD-10-CM | POA: Insufficient documentation

## 2019-10-26 DIAGNOSIS — N184 Chronic kidney disease, stage 4 (severe): Secondary | ICD-10-CM | POA: Insufficient documentation

## 2019-10-26 DIAGNOSIS — Z853 Personal history of malignant neoplasm of breast: Secondary | ICD-10-CM | POA: Diagnosis not present

## 2019-10-26 DIAGNOSIS — Z23 Encounter for immunization: Secondary | ICD-10-CM | POA: Diagnosis not present

## 2019-10-26 DIAGNOSIS — M542 Cervicalgia: Secondary | ICD-10-CM | POA: Diagnosis not present

## 2019-10-26 DIAGNOSIS — Y929 Unspecified place or not applicable: Secondary | ICD-10-CM | POA: Insufficient documentation

## 2019-10-26 DIAGNOSIS — Z7901 Long term (current) use of anticoagulants: Secondary | ICD-10-CM | POA: Insufficient documentation

## 2019-10-26 DIAGNOSIS — E039 Hypothyroidism, unspecified: Secondary | ICD-10-CM | POA: Insufficient documentation

## 2019-10-26 DIAGNOSIS — J449 Chronic obstructive pulmonary disease, unspecified: Secondary | ICD-10-CM | POA: Diagnosis not present

## 2019-10-26 DIAGNOSIS — Y999 Unspecified external cause status: Secondary | ICD-10-CM | POA: Insufficient documentation

## 2019-10-26 DIAGNOSIS — S0990XA Unspecified injury of head, initial encounter: Secondary | ICD-10-CM | POA: Insufficient documentation

## 2019-10-26 DIAGNOSIS — M25511 Pain in right shoulder: Secondary | ICD-10-CM | POA: Insufficient documentation

## 2019-10-26 DIAGNOSIS — W228XXA Striking against or struck by other objects, initial encounter: Secondary | ICD-10-CM | POA: Insufficient documentation

## 2019-10-26 DIAGNOSIS — Z7982 Long term (current) use of aspirin: Secondary | ICD-10-CM | POA: Insufficient documentation

## 2019-10-26 DIAGNOSIS — W19XXXA Unspecified fall, initial encounter: Secondary | ICD-10-CM

## 2019-10-26 DIAGNOSIS — Z79899 Other long term (current) drug therapy: Secondary | ICD-10-CM | POA: Diagnosis not present

## 2019-10-26 LAB — CBC WITH DIFFERENTIAL/PLATELET
Abs Immature Granulocytes: 0.02 10*3/uL (ref 0.00–0.07)
Basophils Absolute: 0.1 10*3/uL (ref 0.0–0.1)
Basophils Relative: 1 %
Eosinophils Absolute: 0.2 10*3/uL (ref 0.0–0.5)
Eosinophils Relative: 3 %
HCT: 35.6 % — ABNORMAL LOW (ref 36.0–46.0)
Hemoglobin: 11.8 g/dL — ABNORMAL LOW (ref 12.0–15.0)
Immature Granulocytes: 0 %
Lymphocytes Relative: 23 %
Lymphs Abs: 1.4 10*3/uL (ref 0.7–4.0)
MCH: 32.8 pg (ref 26.0–34.0)
MCHC: 33.1 g/dL (ref 30.0–36.0)
MCV: 98.9 fL (ref 80.0–100.0)
Monocytes Absolute: 0.7 10*3/uL (ref 0.1–1.0)
Monocytes Relative: 12 %
Neutro Abs: 3.7 10*3/uL (ref 1.7–7.7)
Neutrophils Relative %: 61 %
Platelets: 168 10*3/uL (ref 150–400)
RBC: 3.6 MIL/uL — ABNORMAL LOW (ref 3.87–5.11)
RDW: 12.8 % (ref 11.5–15.5)
WBC: 6 10*3/uL (ref 4.0–10.5)
nRBC: 0 % (ref 0.0–0.2)

## 2019-10-26 LAB — COMPREHENSIVE METABOLIC PANEL
ALT: 12 U/L (ref 0–44)
AST: 23 U/L (ref 15–41)
Albumin: 3.8 g/dL (ref 3.5–5.0)
Alkaline Phosphatase: 53 U/L (ref 38–126)
Anion gap: 10 (ref 5–15)
BUN: 36 mg/dL — ABNORMAL HIGH (ref 8–23)
CO2: 26 mmol/L (ref 22–32)
Calcium: 9.2 mg/dL (ref 8.9–10.3)
Chloride: 104 mmol/L (ref 98–111)
Creatinine, Ser: 1.99 mg/dL — ABNORMAL HIGH (ref 0.44–1.00)
GFR calc Af Amer: 26 mL/min — ABNORMAL LOW (ref >60)
GFR calc non Af Amer: 22 mL/min — ABNORMAL LOW (ref >60)
Glucose, Bld: 101 mg/dL — ABNORMAL HIGH (ref 70–99)
Potassium: 3.8 mmol/L (ref 3.5–5.1)
Sodium: 140 mmol/L (ref 135–145)
Total Bilirubin: 0.7 mg/dL (ref 0.3–1.2)
Total Protein: 6.8 g/dL (ref 6.5–8.1)

## 2019-10-26 LAB — PROTIME-INR
INR: 1 (ref 0.8–1.2)
Prothrombin Time: 12.7 seconds (ref 11.4–15.2)

## 2019-10-26 LAB — APTT: aPTT: 25 seconds (ref 24–36)

## 2019-10-26 MED ORDER — ACETAMINOPHEN 325 MG PO TABS
650.0000 mg | ORAL_TABLET | Freq: Once | ORAL | Status: AC
  Administered 2019-10-26: 650 mg via ORAL
  Filled 2019-10-26: qty 2

## 2019-10-26 MED ORDER — TETANUS-DIPHTH-ACELL PERTUSSIS 5-2.5-18.5 LF-MCG/0.5 IM SUSP
0.5000 mL | Freq: Once | INTRAMUSCULAR | Status: AC
  Administered 2019-10-26: 0.5 mL via INTRAMUSCULAR
  Filled 2019-10-26: qty 0.5

## 2019-10-26 NOTE — ED Notes (Signed)
Pt transported to CT ?

## 2019-10-26 NOTE — ED Triage Notes (Signed)
[place] [facility] from [facility] but lives [place] home. Pt had a mechanical fall. Pt head hit the brick the wall. Denies head pain but pt has a golf-ball hematoma. Pt c/o neck pain and R shoulder pain. Pt is on blood thinners and has HTN but has not taken her medications this morning. Denies LOC.

## 2019-10-26 NOTE — ED Provider Notes (Signed)
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 the provisional nature of ED diagnosis, the treatment so far, the ongoing plan of care, follow up appointments and return precautions with the patient and any family or support people present. They expressed understanding and  agreed with the plan, discharged home.   ____________________________________________   FINAL CLINICAL IMPRESSION(S) / ED DIAGNOSES   Final diagnoses:  Fall, initial encounter  Injury of head, initial encounter      MEDICATIONS GIVEN DURING THIS VISIT:  Medications  acetaminophen (TYLENOL) tablet 650 mg (650 mg Oral Given 10/26/19 1134)  Tdap (BOOSTRIX) injection 0.5 mL (0.5 mLs Intramuscular Given 10/26/19 1153)     ED Discharge Orders    None       Note:  This document was prepared using Dragon voice recognition software and may include unintentional dictation errors.   Vanessa [place], MD 10/26/19 1340

## 2019-10-26 NOTE — Discharge Instructions (Addendum)
CT scan was as below.  We discussed the possibility of damage to the ligament in your neck but it seems like your pain is more chronic in nature and more likely just a representative of your osteoarthritis.  If develop any numbness or tingling in your arms or legs or worsening neck pain you can return to the ER for MRI but this time you wanted to hold off.  Your x-ray of your shoulder was negative  Your labs show a slightly increased in your kidney function so drink plenty of fluid.  Also make sure you take your blood pressure medicine since your blood pressure is elevated today.  This should be rechecked with your primary care doctor next week  If you develop worsening confusion, worsening headache or any other concerns you should return to the ER user is always a chance for delayed bleeding secondary to you being on a blood thinner     1. No fracture evident. There is mild spondylolisthesis at C3-4 and C4-5. Spondylolisthesis at these levels also present previously, although spondylolisthesis at C3-4 is marginally increased at this time compared to the 2017 study. This change could be due to a degree of progression of osteoarthritic change in the facets in the upper cervical region. If there is concern for ligamentous injury given the acute trauma, MR correlation to assess for ligamentous injury may be reasonable. No new areas of spondylolisthesis evident.   3. Extensive multifocal osteoarthritic change at multiple levels is noted. No disc extrusion or stenosis evident.   4. 3 mm nodular opacity apical segment right upper lobe is stable compared to the 2017 study. Stability since the 2017 study is indicative of benign etiology.   5. Scattered foci of arterial vascular calcification at several sites noted.

## 2020-03-03 ENCOUNTER — Other Ambulatory Visit: Payer: Self-pay | Admitting: Internal Medicine

## 2020-03-03 DIAGNOSIS — Z1231 Encounter for screening mammogram for malignant neoplasm of breast: Secondary | ICD-10-CM

## 2020-03-30 IMAGING — MG MM DIGITAL SCREENING UNILAT*L* W/ TOMO W/ CAD
4 series · 4 of 12 positions shown · non-contrast
Comparison: Previous exam(s).

CLINICAL DATA: Screening.

EXAM:
DIGITAL SCREENING UNILATERAL LEFT MAMMOGRAM WITH CAD AND TOMO

[L CC synth-2D]
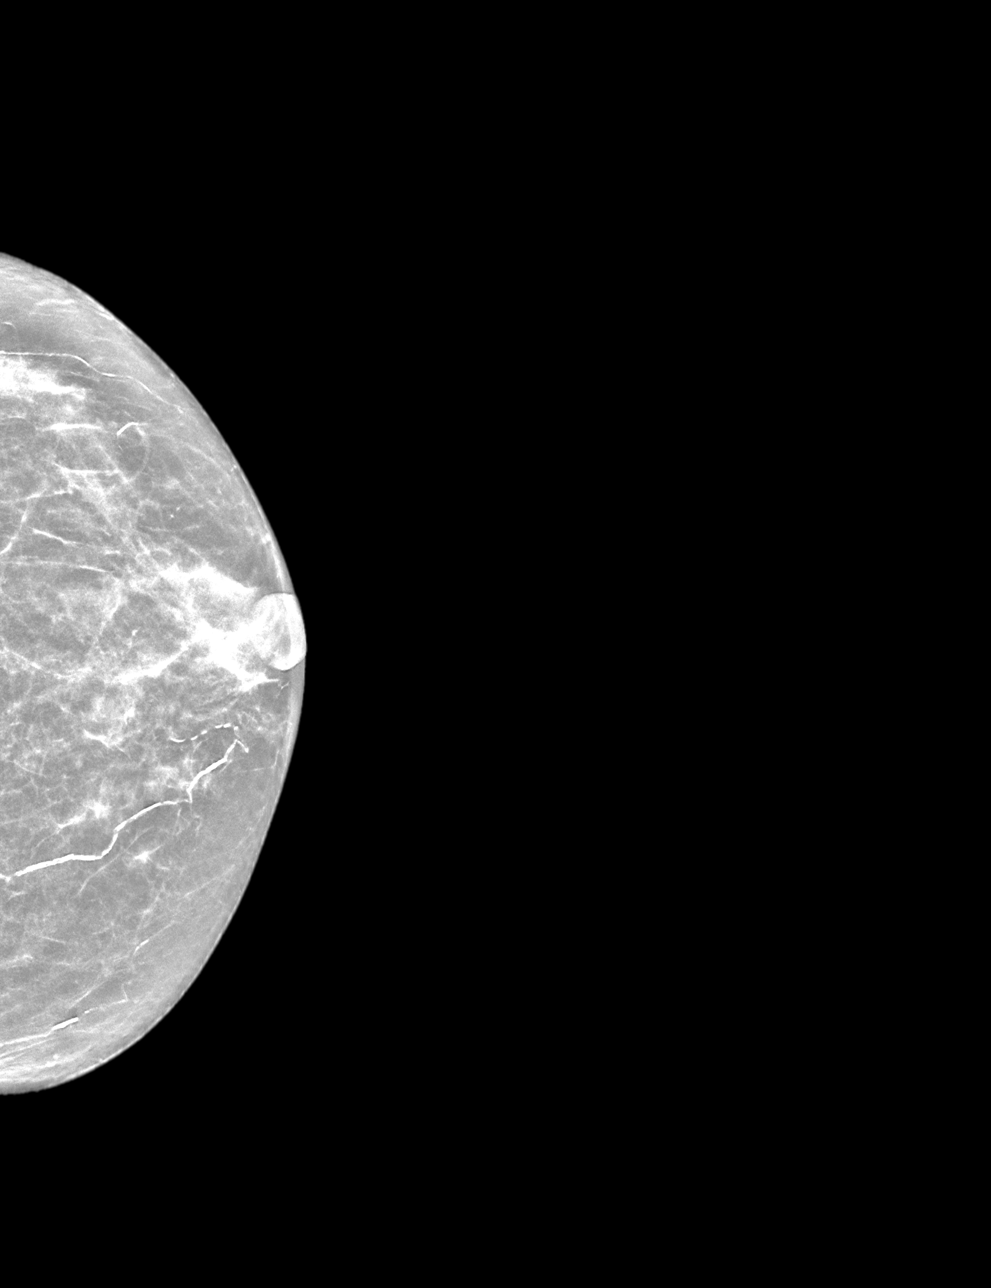

[L MLO synth-2D]
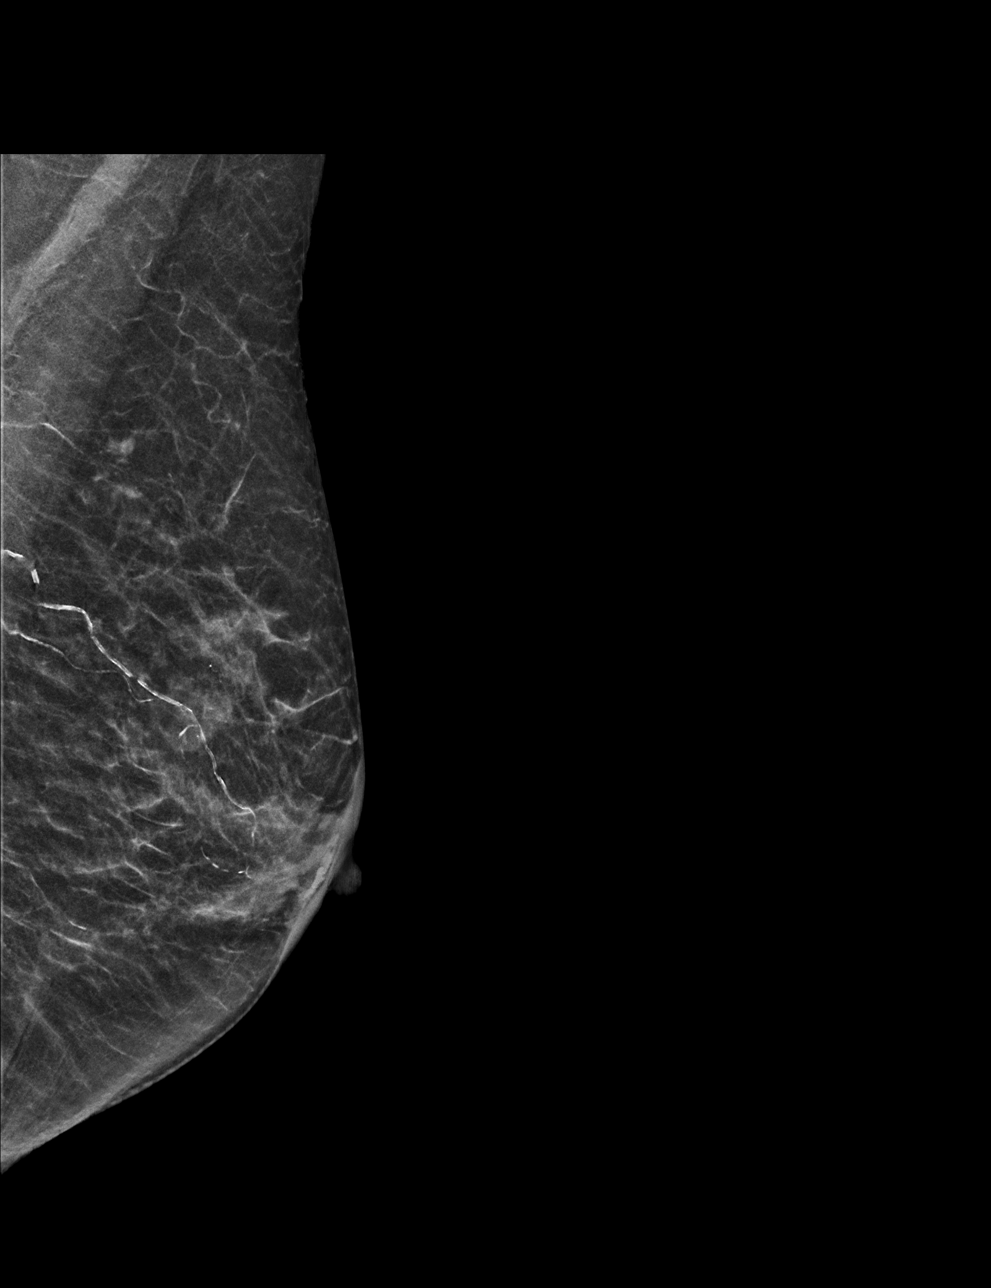

[L CC tomo · tomo slice 18/35.0]
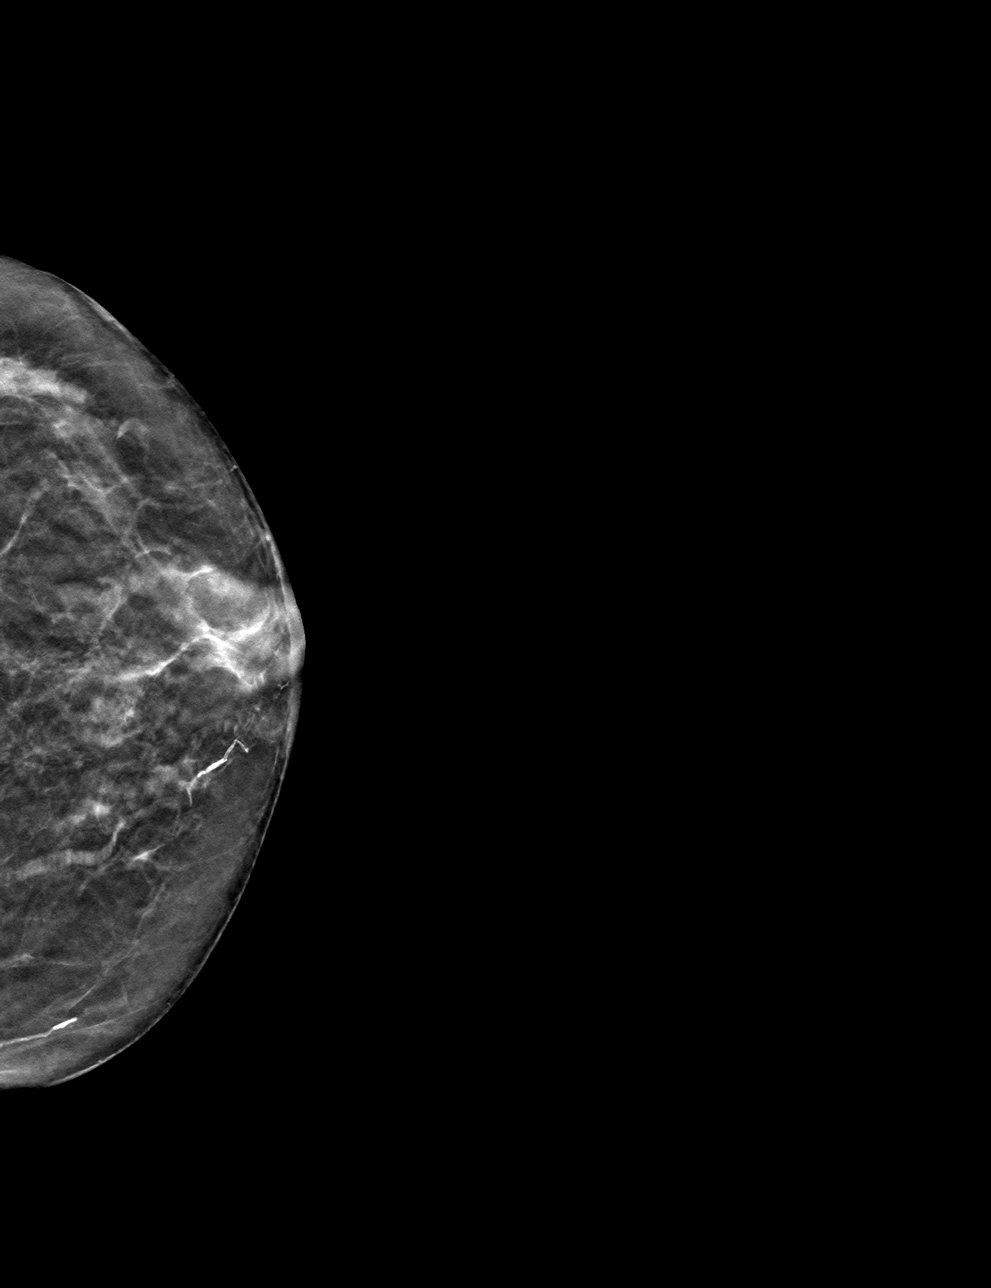

[L MLO tomo · tomo slice 21/41.0]
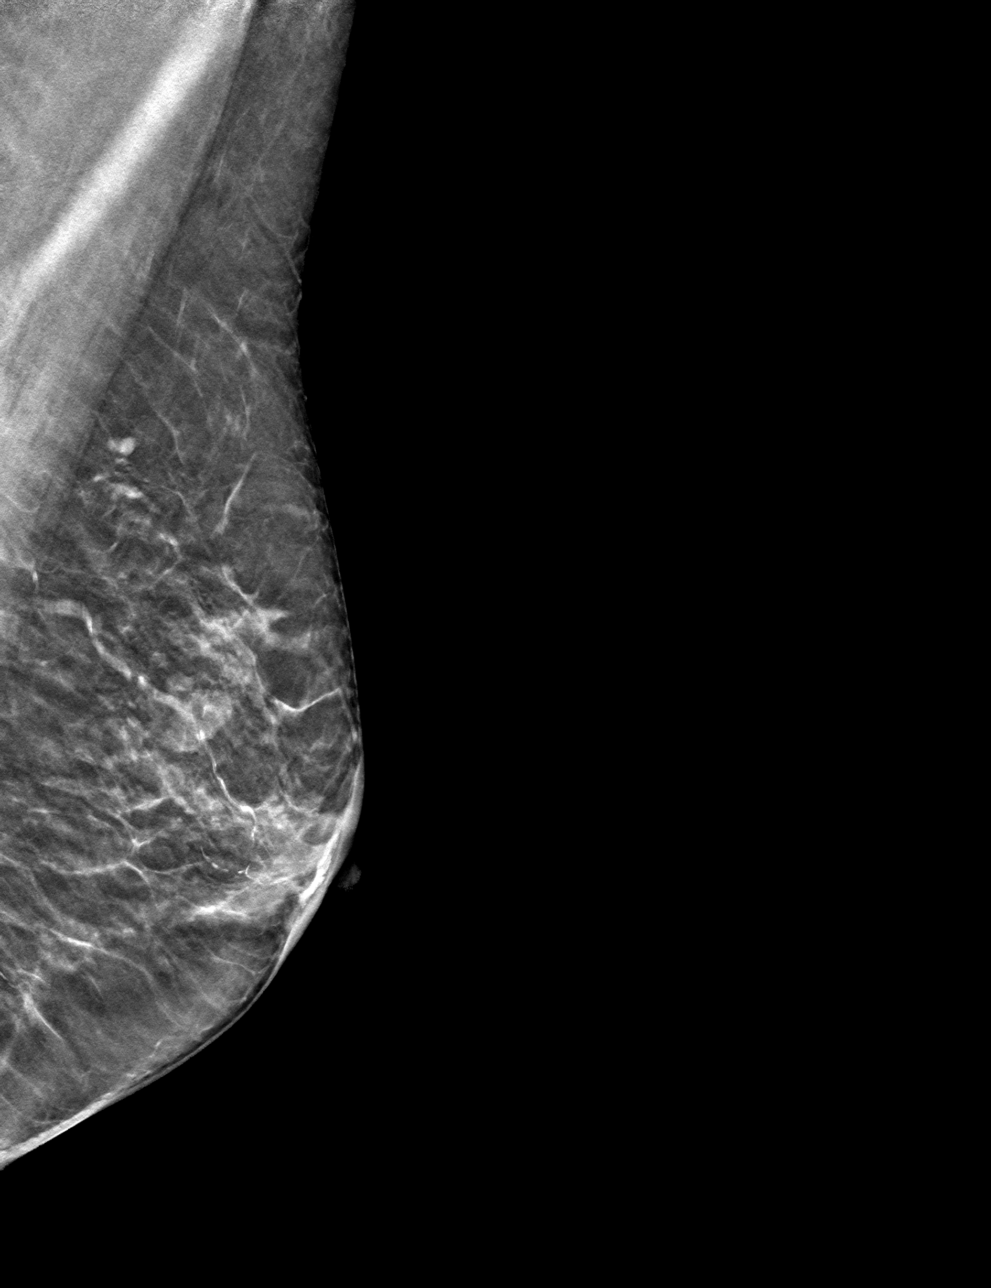

[4 of 12 positions shown; findings below may reference images not displayed]

ACR Breast Density Category b: There are scattered areas of
fibroglandular density.
FINDINGS: There are no findings suspicious for malignancy. Images were
processed with CAD.
IMPRESSION: No mammographic evidence of malignancy. A result letter of this
screening mammogram will be mailed directly to the patient.

RECOMMENDATION:
Screening mammogram in one year. (Code:51-Y-CJA)

BI-RADS CATEGORY  1: Negative.

## 2020-04-13 ENCOUNTER — Ambulatory Visit
Admission: RE | Admit: 2020-04-13 | Discharge: 2020-04-13 | Disposition: A | Discharge status: Home / Self Care | Payer: Medicare HMO | Source: Ambulatory Visit | Attending: Internal Medicine | Admitting: Internal Medicine

## 2020-04-13 ENCOUNTER — Other Ambulatory Visit: Payer: Self-pay

## 2020-04-13 DIAGNOSIS — Z1231 Encounter for screening mammogram for malignant neoplasm of breast: Secondary | ICD-10-CM | POA: Diagnosis not present

## 2020-08-31 ENCOUNTER — Emergency Department: Payer: Medicare HMO

## 2020-08-31 ENCOUNTER — Other Ambulatory Visit: Payer: Self-pay

## 2020-08-31 ENCOUNTER — Emergency Department
Admission: EM | Admit: 2020-08-31 | Discharge: 2020-08-31 | Disposition: A | Discharge status: Home / Self Care | Payer: Medicare HMO | Attending: Emergency Medicine | Admitting: Emergency Medicine

## 2020-08-31 DIAGNOSIS — K5790 Diverticulosis of intestine, part unspecified, without perforation or abscess without bleeding: Secondary | ICD-10-CM

## 2020-08-31 DIAGNOSIS — E039 Hypothyroidism, unspecified: Secondary | ICD-10-CM | POA: Diagnosis not present

## 2020-08-31 DIAGNOSIS — R531 Weakness: Secondary | ICD-10-CM | POA: Diagnosis not present

## 2020-08-31 DIAGNOSIS — R000 Tachycardia, unspecified: Secondary | ICD-10-CM | POA: Insufficient documentation

## 2020-08-31 DIAGNOSIS — I130 Hypertensive heart and chronic kidney disease with heart failure and stage 1 through stage 4 chronic kidney disease, or unspecified chronic kidney disease: Secondary | ICD-10-CM | POA: Insufficient documentation

## 2020-08-31 DIAGNOSIS — I4581 Long QT syndrome: Secondary | ICD-10-CM | POA: Diagnosis not present

## 2020-08-31 DIAGNOSIS — I5032 Chronic diastolic (congestive) heart failure: Secondary | ICD-10-CM | POA: Insufficient documentation

## 2020-08-31 DIAGNOSIS — R1110 Vomiting, unspecified: Secondary | ICD-10-CM | POA: Insufficient documentation

## 2020-08-31 DIAGNOSIS — N184 Chronic kidney disease, stage 4 (severe): Secondary | ICD-10-CM | POA: Diagnosis not present

## 2020-08-31 DIAGNOSIS — J449 Chronic obstructive pulmonary disease, unspecified: Secondary | ICD-10-CM | POA: Insufficient documentation

## 2020-08-31 DIAGNOSIS — Z96653 Presence of artificial knee joint, bilateral: Secondary | ICD-10-CM | POA: Diagnosis not present

## 2020-08-31 DIAGNOSIS — N281 Cyst of kidney, acquired: Secondary | ICD-10-CM | POA: Diagnosis not present

## 2020-08-31 DIAGNOSIS — Z853 Personal history of malignant neoplasm of breast: Secondary | ICD-10-CM | POA: Diagnosis not present

## 2020-08-31 DIAGNOSIS — Z79899 Other long term (current) drug therapy: Secondary | ICD-10-CM | POA: Insufficient documentation

## 2020-08-31 DIAGNOSIS — Z7982 Long term (current) use of aspirin: Secondary | ICD-10-CM | POA: Insufficient documentation

## 2020-08-31 DIAGNOSIS — K8020 Calculus of gallbladder without cholecystitis without obstruction: Secondary | ICD-10-CM | POA: Diagnosis not present

## 2020-08-31 DIAGNOSIS — R197 Diarrhea, unspecified: Secondary | ICD-10-CM | POA: Diagnosis present

## 2020-08-31 DIAGNOSIS — E860 Dehydration: Secondary | ICD-10-CM

## 2020-08-31 DIAGNOSIS — R9431 Abnormal electrocardiogram [ECG] [EKG]: Secondary | ICD-10-CM

## 2020-08-31 LAB — COMPREHENSIVE METABOLIC PANEL
ALT: 22 U/L (ref 0–44)
AST: 31 U/L (ref 15–41)
Albumin: 4.1 g/dL (ref 3.5–5.0)
Alkaline Phosphatase: 49 U/L (ref 38–126)
Anion gap: 8 (ref 5–15)
BUN: 29 mg/dL — ABNORMAL HIGH (ref 8–23)
CO2: 25 mmol/L (ref 22–32)
Calcium: 9.4 mg/dL (ref 8.9–10.3)
Chloride: 107 mmol/L (ref 98–111)
Creatinine, Ser: 1.29 mg/dL — ABNORMAL HIGH (ref 0.44–1.00)
GFR, Estimated: 41 mL/min — ABNORMAL LOW (ref >60)
Glucose, Bld: 104 mg/dL — ABNORMAL HIGH (ref 70–99)
Potassium: 4.1 mmol/L (ref 3.5–5.1)
Sodium: 140 mmol/L (ref 135–145)
Total Bilirubin: 0.7 mg/dL (ref 0.3–1.2)
Total Protein: 7.1 g/dL (ref 6.5–8.1)

## 2020-08-31 LAB — CBC
HCT: 41.1 % (ref 36.0–46.0)
Hemoglobin: 13.7 g/dL (ref 12.0–15.0)
MCH: 33.2 pg (ref 26.0–34.0)
MCHC: 33.3 g/dL (ref 30.0–36.0)
MCV: 99.5 fL (ref 80.0–100.0)
Platelets: 174 10*3/uL (ref 150–400)
RBC: 4.13 MIL/uL (ref 3.87–5.11)
RDW: 13 % (ref 11.5–15.5)
WBC: 7.3 10*3/uL (ref 4.0–10.5)
nRBC: 0 % (ref 0.0–0.2)

## 2020-08-31 LAB — URINALYSIS, COMPLETE (UACMP) WITH MICROSCOPIC
Bacteria, UA: NONE SEEN
Bilirubin Urine: NEGATIVE
Glucose, UA: NEGATIVE mg/dL
Hgb urine dipstick: NEGATIVE
Ketones, ur: NEGATIVE mg/dL
Nitrite: NEGATIVE
Protein, ur: NEGATIVE mg/dL
Specific Gravity, Urine: 1.017 (ref 1.005–1.030)
Squamous Epithelial / HPF: NONE SEEN (ref 0–5)
pH: 5 (ref 5.0–8.0)

## 2020-08-31 LAB — MAGNESIUM: Magnesium: 2.1 mg/dL (ref 1.7–2.4)

## 2020-08-31 LAB — TROPONIN I (HIGH SENSITIVITY): Troponin I (High Sensitivity): 7 ng/L (ref <18)

## 2020-08-31 LAB — LIPASE, BLOOD: Lipase: 57 U/L — ABNORMAL HIGH (ref 11–51)

## 2020-08-31 MED ORDER — DICYCLOMINE HCL 10 MG PO CAPS: 10.0000 mg | ORAL_CAPSULE | Freq: Four times a day (QID) | ORAL | 0 refills | Status: DC

## 2020-08-31 MED ORDER — LACTATED RINGERS IV BOLUS
1000.0000 mL | Freq: Once | INTRAVENOUS | Status: AC
  Administered 2020-08-31: 1000 mL via INTRAVENOUS

## 2020-08-31 NOTE — ED Provider Notes (Addendum)
[facility] Emergency Department Provider Note  ____________________________________________   Event Date/Time   First MD Initiated Contact with Patient 08/31/20 1518     (approximate)  I have reviewed the triage vital signs and the nursing notes.   HISTORY  Chief Complaint Weakness and Diarrhea   HPI [name] is a 87 y.o. female with a past medical history of CHF, CKD, DVT on Eliquis, HTN, HPL, hypothyroidism, PAD, paroxysmal A. fib, COPD and breast cancer s/p radiation and bedside mastectomy as well as chronic back pain who presents for assessment approximately 4 weeks of some watery diarrhea.  Patient states he was recently diagnosed with E. coli and took azithromycin for couple days but this is not seem to help and she is coming today because she is feeling just a little weaker than usual and she feels her diarrhea is unremitting vomiting at least 5 or 6 times per day but likely more.  She denies any abdominal pain, back pain, urinary symptoms, fevers, cough, shortness of breath, chest pain, or other clear associated sick symptoms.  Imodium has not helped.  No other clearly fitting aggravating factors.         Past Medical History:  Diagnosis Date  . (HFpEF) heart failure with preserved ejection fraction ([place])    a. 12/2015 Echo: EF 60-65%. no rwma, Gr2 DD, triv AI, mild MR, mildly dil LA. Mild-mod TR. PASP 38mmHg.  Marland Kitchen Anemia   . Arthritis    "back, hands" (09/08/2015)  . Cancer of right breast [facility]) 2007, 2013   right breast- 2007 radiation-mastectomy  . Chronic lower back pain   . CKD (chronic kidney disease), stage IV ([place])   . Complication of anesthesia    "took me about 1 week to know what was going on after one of my knee ORs"  . COPD (chronic obstructive pulmonary disease) ([place])   . DDD (degenerative disc disease), lumbar   . Depression   . 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 DVT (deep venous thrombosis) ([place])   .  Edema    FEET/ANKLES  . GERD (gastroesophageal reflux disease)   . History of sepsis   . History of stress test    a. 08/2010: EF 73%, no ischemia/infarct.  . Hyperlipidemia   . Hypertension   . Hypothyroid   . PAD (peripheral artery disease) ([place])    a. 08/2015 s/p PTA of R AT w/ drug-coated balloon angioplasty to R Popliteal; b. 05/2017 ABI: R 1.16, L 0.88-->stable.  Marland Kitchen PAF (paroxysmal atrial fibrillation) ([place])    a. 08/2010 s/p DCCV;  b. CHA2DS2VASc = 6-->No OAC 2/2 h/o falls.  Marland Kitchen PONV (postoperative nausea and vomiting)    nausea vomiting long ago with surgery but not recent ones    Patient Active Problem List   Diagnosis Date Noted  . Acute deep vein thrombosis (DVT) of left peroneal vein (HCC) 03/06/2018  . Arterial leg ulcer ([place]) 03/06/2018  . Hypertensive heart and kidney disease with chronic diastolic congestive heart failure and stage 3 chronic kidney disease ([place]) 03/06/2018  . GERD without esophagitis 03/06/2018  . CKD (chronic kidney disease) stage 3, GFR 30-59 ml/min (HCC) 03/06/2018  . Anemia associated with stage 3 chronic renal failure ([place]) 03/06/2018  . Hypothyroidism due to acquired atrophy of thyroid 03/06/2018  . Dyslipidemia 03/06/2018  . Primary osteoarthritis of left knee 03/06/2018  . Status post total knee replacement using cement, left 02/26/2018  . Orthostatic hypotension 01/14/2016  .  ARF (acute renal failure) ([place]) 01/14/2016  . Dehydration 01/14/2016  . Protein-calorie malnutrition, severe 01/12/2016  . 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 PAD (peripheral artery disease) ([place]) 08/10/2014  . Chronic diastolic congestive heart failure ([place]) 02/26/2014  . Renal insufficiency 09/14/2011  . PAF (paroxysmal atrial fibrillation) ([place]) 08/09/2010  . Hypertension 08/09/2010    Past Surgical History:  Procedure Laterality Date  . ABDOMINAL EXPLORATION SURGERY     "had to go back in after hysterectomy & check on stitch dr had put  near my bladder; don't know if they took it out; had to wear catheter for 1 month"  . ABDOMINAL HYSTERECTOMY    . APPENDECTOMY    . BACK SURGERY    . BALLOON ANGIOPLASTY, ARTERY Right 09/08/2015   superficial femoral  . BREAST EXCISIONAL BIOPSY Right 2007   positive  . BREAST SURGERY    . CARDIOVERSION  11/10/2011   Procedure: CARDIOVERSION;  Surgeon: Lelon Perla, MD;  Location: [facility];  Service: Cardiovascular;  Laterality: N/A;  . CARDIOVERSION  08/26/2010  . CATARACT EXTRACTION W/PHACO Right 07/17/2017   Procedure: CATARACT EXTRACTION PHACO AND INTRAOCULAR LENS PLACEMENT (IOC);  Surgeon: Birder Robson, MD;  Location: [facility] OR[facility];  Service: Ophthalmology;  Laterality: Right;  [place] 00:44.0 AP% 14.6 CDE 6.44 FLUID PACK LOT # [id number]  . CATARACT EXTRACTION W/PHACO Left 08/07/2017   Procedure: CATARACT EXTRACTION PHACO AND INTRAOCULAR LENS PLACEMENT (IOC);  Surgeon: Birder Robson, MD;  Location: [facility];  Service: Ophthalmology;  Laterality: Left;  [place] 00:44 AP% 14.1 CDE 6.32 Fluid pack lot # [id number]  . COLONOSCOPY    . COLONOSCOPY WITH PROPOFOL N/A 01/04/2016   Procedure: COLONOSCOPY WITH PROPOFOL;  Surgeon: Lollie Sails, MD;  Location: [facility];  Service: Endoscopy;  Laterality: N/A;  . EYE SURGERY    . HALLUX VALGUS CORRECTION    . JOINT REPLACEMENT    . KYPHOPLASTY N/A 06/25/2018   Procedure: KYPHOPLASTY L3;  Surgeon: Hessie Knows, MD;  Location: [facility];  Service: Orthopedics;  Laterality: N/A;  . LUMBAR [place]    . MASTECTOMY Right 2013   positive  . PERIPHERAL VASCULAR CATHETERIZATION N/A 09/08/2015   Procedure: Abdominal Aortogram w/Lower Extremity;  Surgeon: Wellington Hampshire, MD;  Location: [place] [facility];  Service: Cardiovascular;  Laterality: N/A;  . PERIPHERAL VASCULAR CATHETERIZATION  09/08/2015   Procedure: Peripheral Vascular Balloon Angioplasty;  Surgeon: Wellington Hampshire, MD;  Location: [place] [facility];  Service: Cardiovascular;;  .  REVISION TOTAL KNEE ARTHROPLASTY Right   . rt.ankle ulcer surgery  2013  . TEMPOROMANDIBULAR JOINT ARTHROPLASTY    . TMJ ARTHROPLASTY    . TONSILLECTOMY    . TOTAL KNEE ARTHROPLASTY Right   . TOTAL KNEE ARTHROPLASTY Left 02/26/2018   Procedure: TOTAL KNEE ARTHROPLASTY;  Surgeon: Corky Mull, MD;  Location: [facility] OR[facility];  Service: Orthopedics;  Laterality: Left;  Marland Kitchen VASCULAR SURGERY      Prior to Admission medications   Medication Sig Start Date End Date Taking? Authorizing Provider  dicyclomine (BENTYL) 10 MG capsule Take 1 capsule (10 mg total) by mouth 4 (four) times daily for 5 days. 08/31/20 09/05/20 Yes Lucrezia Starch, MD  acetaminophen (TYLENOL) 325 MG tablet Take 650 mg by mouth every 4 (four) hours as needed (pain).  03/03/18   [provider]  acetaminophen (TYLENOL) 500 MG tablet Take 1,000 mg by mouth at bedtime as needed for moderate pain or headache.  [provider]  amiodarone (PACERONE) 200 MG tablet Take 1 tablet (200 mg total) by mouth daily. 03/19/18   Gerlene Fee, NP  apixaban (ELIQUIS) 5 MG TABS tablet Take 1 tablet (5 mg total) by mouth 2 (two) times daily. 03/19/18   Gerlene Fee, NP  aspirin EC 81 MG tablet Take 81 mg by mouth daily.    [provider]  calcium-vitamin D (OSCAL WITH D) 500-200 MG-UNIT per tablet Take 1 tablet by mouth daily with breakfast.    [provider]  Cholecalciferol (VITAMIN D3) 5000 units CAPS Take 5,000 Units by mouth daily.     [provider]  docusate sodium (COLACE) 100 MG capsule Take 100 mg by mouth daily as needed for mild constipation.    [provider]  donepezil (ARICEPT) 5 MG tablet Take 5 mg by mouth at bedtime. 04/15/18   [provider]  ferrous sulfate 325 (65 FE) MG tablet Take 1 tablet (325 mg total) by mouth daily with breakfast. 03/19/18   Gerlene Fee, NP  guaifenesin (ROBITUSSIN) 100 MG/5ML syrup Take 200 mg by mouth 3 (three) times daily as needed  for cough.    [provider]  lactose free nutrition (BOOST) LIQD Take 237 mLs by mouth daily.    [provider]  levothyroxine (SYNTHROID, LEVOTHROID) 150 MCG tablet Take 1 tablet (150 mcg total) by mouth daily before breakfast. 03/19/18   Gerlene Fee, NP  lisinopril (PRINIVIL,ZESTRIL) 10 MG tablet Take 1 tablet (10 mg total) by mouth daily. 03/19/18   Gerlene Fee, NP  Multiple Vitamins-Minerals (CENTROVITE) TABS Take 1 tablet by mouth daily. 03/04/18   [provider]  NON FORMULARY Diet type: NAS, Regular consistency, thin liquids ok    [provider]  pantoprazole (PROTONIX) 40 MG tablet Take 1 tablet (40 mg total) by mouth daily. 03/19/18   Gerlene Fee, NP  PARoxetine (PAXIL) 20 MG tablet Take 1 tablet (20 mg total) by mouth daily. 03/19/18   Gerlene Fee, NP  potassium chloride (K-DUR) 10 MEQ tablet Take 1 tablet (10 mEq total) by mouth 2 (two) times daily. 03/19/18   Gerlene Fee, NP  rosuvastatin (CRESTOR) 10 MG tablet Take 1 tablet (10 mg total) by mouth daily. 03/19/18   Gerlene Fee, NP  spironolactone (ALDACTONE) 25 MG tablet Take 1 tablet (25 mg total) by mouth daily. 03/19/18   Gerlene Fee, NP  torsemide (DEMADEX) 20 MG tablet Take 1 tablet (20 mg total) by mouth daily. 03/19/18   Gerlene Fee, NP  Wound Dressings (ALLEVYN GENTLE BORDER EX) Apply 1 patch topically every 3 (three) days. Apply to left lower extremity every 3 days cleanse with saline; apply skin prep to surrounding skin and apply patch every 3 days.     [provider]    Allergies Other, Amlodipine, Ciprofloxacin, Doxycycline, Lipitor [atorvastatin], Morphine and related, Macrobid [nitrofurantoin macrocrystal], Penicillins, and Sulfa antibiotics  Family History  Problem Relation Age of Onset  . Heart attack Father   . Coronary artery disease Sister 105       MI  . Breast cancer Maternal Aunt     Social History Social History   Tobacco  Use  . Smoking status: Never Smoker  . Smokeless tobacco: Never Used  Vaping Use  . Vaping Use: Never used  Substance Use Topics  . Alcohol use: No  . Drug use: No    Review of Systems  Review of  Systems  Constitutional: Positive for malaise/fatigue. Negative for chills and fever.  HENT: Negative for sore throat.   Eyes: Negative for pain.  Respiratory: Negative for cough and stridor.   Cardiovascular: Negative for chest pain.  Gastrointestinal: Positive for diarrhea. Negative for vomiting.  Genitourinary: Negative for dysuria.  Musculoskeletal: Negative for myalgias.  Skin: Negative for rash.  Neurological: Positive for weakness. Negative for dizziness, seizures, loss of consciousness and headaches.  Psychiatric/Behavioral: Negative for suicidal ideas.  All other systems reviewed and are negative.     ____________________________________________   PHYSICAL EXAM:  VITAL SIGNS: ED Triage Vitals  Enc Vitals Group     BP 08/31/20 1420 (!) 166/78     Pulse Rate 08/31/20 1420 (!) 102     Resp 08/31/20 1420 16     Temp 08/31/20 1420 (!) 97.5 F (36.4 C)     Temp Source 08/31/20 1420 Oral     SpO2 08/31/20 1420 100 %     Weight 08/31/20 1422 135 lb (61.2 kg)     Height 08/31/20 1422 5\' 8"  (1.727 m)     Head Circumference --      Peak Flow --      Pain Score 08/31/20 1421 0     Pain Loc --      Pain Edu? --      Excl. in [place]? --    Vitals:   08/31/20 1420  BP: (!) 166/78  Pulse: (!) 102  Resp: 16  Temp: (!) 97.5 F (36.4 C)  SpO2: 100%   Physical Exam Vitals and nursing note reviewed.  Constitutional:      General: She is not in acute distress.    Appearance: She is well-developed.  HENT:     Head: Normocephalic and atraumatic.     Right Ear: External ear normal.     Left Ear: External ear normal.     Mouth/Throat:     Mouth: Mucous membranes are dry.  Eyes:     Conjunctiva/sclera: Conjunctivae normal.  Cardiovascular:     Rate and Rhythm: Regular  rhythm. Tachycardia present.     Heart sounds: No murmur heard.   Pulmonary:     Effort: Pulmonary effort is normal. No respiratory distress.     Breath sounds: Normal breath sounds.  Abdominal:     Palpations: Abdomen is soft.     Tenderness: There is no abdominal tenderness.  Musculoskeletal:     Cervical back: Neck supple.  Skin:    General: Skin is warm and dry.     Capillary Refill: Capillary refill takes 2 to 3 seconds.  Neurological:     Mental Status: She is alert and oriented to person, place, and time.  Psychiatric:        Mood and Affect: Mood normal.      ____________________________________________   LABS (all labs ordered are listed, but only abnormal results are displayed)  Labs Reviewed  LIPASE, BLOOD - Abnormal; Notable for the following components:      Result Value   Lipase 57 (*)    All other components within normal limits  COMPREHENSIVE METABOLIC PANEL - Abnormal; Notable for the following components:   Glucose, Bld 104 (*)    BUN 29 (*)    Creatinine, Ser 1.29 (*)    GFR, Estimated 41 (*)    All other components within normal limits  URINALYSIS, COMPLETE (UACMP) WITH MICROSCOPIC - Abnormal; Notable for the following components:   Color, Urine YELLOW (*)  APPearance CLEAR (*)    Leukocytes,Ua SMALL (*)    All other components within normal limits  CBC  MAGNESIUM  TROPONIN I (HIGH SENSITIVITY)  TROPONIN I (HIGH SENSITIVITY)   ____________________________________________  EKG  A. fib with a ventricular rate of 105, normal axis, QTc interval of 536, right bundle branch block and some nonspecific ST changes throughout. ____________________________________________  RADIOLOGY  ED MD interpretation: CT abdomen pelvis shows cholelithiasis without evidence of cholecystitis, diverticulosis without evidence of diverticulitis, small hiatal hernia and left renal cyst.  No evidence of abscess, SBO or any other clear acute abdominal pelvic  pathology.  Official radiology report(s): CT ABDOMEN PELVIS WO CONTRAST  Result Date: 08/31/2020 CLINICAL DATA:  Diarrhea x4 weeks. EXAM: CT ABDOMEN AND PELVIS WITHOUT CONTRAST TECHNIQUE: Multidetector CT imaging of the abdomen and pelvis was performed following the standard protocol without IV contrast. COMPARISON:  July 31, 2012 FINDINGS: Lower chest: No acute abnormality. Hepatobiliary: No focal liver abnormality is seen. Tiny gallstones are seen without evidence of gallbladder wall thickening or biliary dilatation. Pancreas: Unremarkable. No pancreatic ductal dilatation or surrounding inflammatory changes. Spleen: Multiple small calcified granulomas are seen scattered throughout the spleen. Adrenals/Urinary Tract: Adrenal glands are unremarkable. Kidneys are normal in size. Predominant stable mild to moderate severity right-sided hydronephrosis is seen. No renal calculi are identified. An 8 mm diameter exophytic area of low attenuation is seen along the posterolateral aspect of the mid left kidney. Bladder is unremarkable. Stomach/Bowel: There is a small hiatal hernia. The appendix is surgically absent. No evidence of bowel wall thickening, distention, or inflammatory changes. Noninflamed diverticula are seen within the descending and sigmoid colon. Vascular/Lymphatic: Aortic atherosclerosis. No enlarged abdominal or pelvic lymph nodes. Reproductive: Status post hysterectomy. No adnexal masses. Other: No abdominal wall hernia or abnormality. No abdominopelvic ascites. Musculoskeletal: Prior vertebroplasty is seen at the level of L3. Marked severity degenerative changes are seen throughout the lumbar spine with marked severity levoscoliosis. IMPRESSION: 1. Cholelithiasis. 2. Colonic diverticulosis. 3. Small hiatal hernia. 4. Suspected small left renal cyst. Correlation with nonemergent renal ultrasound is recommended. Electronically Signed   By: Virgina Norfolk M.D.   On: 08/31/2020 15:58     ____________________________________________   PROCEDURES  Procedure(s) performed (including Critical Care):  Procedures   ____________________________________________   INITIAL IMPRESSION / ASSESSMENT AND PLAN / ED COURSE      Patient presents with above-stated history and exam for assessment of some persistent diarrhea recently diagnosed with E. coli colitis and completed a short course of azithromycin but with persistent diarrhea weakness and concerns that she may be dehydrated today.  On arrival she is hypertensive with BP of 166/78 and slightly tachycardic at 102 with otherwise stable vital signs on room air.  Differential includes possible persistent acute infectious colitis, diverticulitis, metabolic derangements and significant dehydration from persistent GI losses.  She denies any urinary symptoms and UA is not suggestive of acute cystitis.  CBC shows no leukocytosis or acute anemia.  Lipase not consistent with acute pancreatitis.  CMP shows a creatinine of 1.29 without any other significant electrolyte or metabolic derangements.  ECG shows prolonged QTc interval.  I suspect this may be secondary to patient's recent azithromycin prescription.  We will hold off on any additional antibiotics or loperamide as this could potentially further prolong her QTC.  CT abdomen pelvis shows cholelithiasis without evidence of cholecystitis, diverticulosis without evidence of diverticulitis, small hiatal hernia and left renal cyst.  No evidence of abscess, SBO or any other clear acute abdominal  pelvic pathology.  Advised patient of renal cyst and recommendation for PCP to coordinate follow-up imaging.  CMP shows no significant interval metabolic derangements.  Kidney function is improved at 1.29 from 1.99 when his last check 10 months ago in epic EHR.  CBC shows no leukocytosis or acute anemia.  Magnesium is unremarkable.  UA has small LES otherwise no evidence of infection overall  Evalose patient for cystitis.  Troponin is nonelevated and overall I have a very low suspicion for symptomatic arrhythmia or atypical presentation for ACS.  Discussed patient's presentation and work-up as well as recent history with on-call gastroenterologist Dr. Bonna Gains.  SHe recommended no additional antibiotics at this time and starting the budesonide prescribed last week but not yet taken.   On my reassessment patient stated she felt much better.  Had a long discussion with patient regarding admission for observation and routine hydration and GI evaluation tomorrow versus going home with plans to follow-up with her gastroenterologist later this week.  Patient that she strongly wishes to go home and does not think she was ever close to passing out and did not have any other symptoms other than some fatigue today.  We will try addition of Bentyl and advised her to start her prescription of budesonide.  Given stable vitals otherwise reassuring exam and work-up with low suspicion for sepsis or other significant metabolic derangements think this is reasonable.  Patient discharged stable condition.  Strict return precautions advised and discussed.   ____________________________________________   FINAL CLINICAL IMPRESSION(S) / ED DIAGNOSES  Final diagnoses:  Diarrhea, unspecified type  Dehydration  QT prolongation  Renal cyst  Calculus of gallbladder without cholecystitis without obstruction  Diverticulosis    Medications  lactated ringers bolus 1,000 mL (1,000 mLs Intravenous New Bag/Given 08/31/20 1555)     ED Discharge Orders         Ordered    dicyclomine (BENTYL) 10 MG capsule  4 times daily        08/31/20 1638           Note:  This document was prepared using Dragon voice recognition software and may include unintentional dictation errors.   Lucrezia Starch, MD 08/31/20 1643    Lucrezia Starch, MD 08/31/20 1644    Lucrezia Starch, MD 08/31/20 [id number]

## 2020-08-31 NOTE — ED Triage Notes (Signed)
Pt c/o watery diarrhea for the past 4 weeks, denies any abd pain, N/V. States she has been seeing GI for it and has been on a couple of medications with no relief. States she is just feelng so weak and dehydrated

## 2020-12-20 ENCOUNTER — Emergency Department: Payer: Medicare HMO

## 2020-12-20 ENCOUNTER — Observation Stay
Admission: EM | Admit: 2020-12-20 | Discharge: 2020-12-21 | Disposition: A | Discharge status: Home / Self Care | Payer: Medicare HMO | Attending: Hospitalist | Admitting: Hospitalist

## 2020-12-20 DIAGNOSIS — K920 Hematemesis: Secondary | ICD-10-CM | POA: Diagnosis not present

## 2020-12-20 DIAGNOSIS — N184 Chronic kidney disease, stage 4 (severe): Secondary | ICD-10-CM | POA: Diagnosis not present

## 2020-12-20 DIAGNOSIS — K219 Gastro-esophageal reflux disease without esophagitis: Secondary | ICD-10-CM | POA: Diagnosis not present

## 2020-12-20 DIAGNOSIS — Z96653 Presence of artificial knee joint, bilateral: Secondary | ICD-10-CM | POA: Diagnosis not present

## 2020-12-20 DIAGNOSIS — Z881 Allergy status to other antibiotic agents status: Secondary | ICD-10-CM | POA: Insufficient documentation

## 2020-12-20 DIAGNOSIS — Z885 Allergy status to narcotic agent status: Secondary | ICD-10-CM | POA: Diagnosis not present

## 2020-12-20 DIAGNOSIS — E785 Hyperlipidemia, unspecified: Secondary | ICD-10-CM | POA: Insufficient documentation

## 2020-12-20 DIAGNOSIS — W0110XA Fall on same level from slipping, tripping and stumbling with subsequent striking against unspecified object, initial encounter: Secondary | ICD-10-CM | POA: Diagnosis not present

## 2020-12-20 DIAGNOSIS — I739 Peripheral vascular disease, unspecified: Secondary | ICD-10-CM | POA: Insufficient documentation

## 2020-12-20 DIAGNOSIS — S7001XA Contusion of right hip, initial encounter: Secondary | ICD-10-CM | POA: Diagnosis not present

## 2020-12-20 DIAGNOSIS — Z853 Personal history of malignant neoplasm of breast: Secondary | ICD-10-CM | POA: Insufficient documentation

## 2020-12-20 DIAGNOSIS — N289 Disorder of kidney and ureter, unspecified: Secondary | ICD-10-CM

## 2020-12-20 DIAGNOSIS — Z7982 Long term (current) use of aspirin: Secondary | ICD-10-CM | POA: Insufficient documentation

## 2020-12-20 DIAGNOSIS — R197 Diarrhea, unspecified: Secondary | ICD-10-CM | POA: Diagnosis not present

## 2020-12-20 DIAGNOSIS — D649 Anemia, unspecified: Secondary | ICD-10-CM | POA: Diagnosis not present

## 2020-12-20 DIAGNOSIS — Z7901 Long term (current) use of anticoagulants: Secondary | ICD-10-CM | POA: Diagnosis not present

## 2020-12-20 DIAGNOSIS — S0990XA Unspecified injury of head, initial encounter: Secondary | ICD-10-CM | POA: Diagnosis present

## 2020-12-20 DIAGNOSIS — W228XXA Striking against or struck by other objects, initial encounter: Secondary | ICD-10-CM | POA: Insufficient documentation

## 2020-12-20 DIAGNOSIS — D631 Anemia in chronic kidney disease: Secondary | ICD-10-CM | POA: Diagnosis not present

## 2020-12-20 DIAGNOSIS — E039 Hypothyroidism, unspecified: Secondary | ICD-10-CM | POA: Diagnosis not present

## 2020-12-20 DIAGNOSIS — Z79899 Other long term (current) drug therapy: Secondary | ICD-10-CM | POA: Insufficient documentation

## 2020-12-20 DIAGNOSIS — I130 Hypertensive heart and chronic kidney disease with heart failure and stage 1 through stage 4 chronic kidney disease, or unspecified chronic kidney disease: Secondary | ICD-10-CM | POA: Insufficient documentation

## 2020-12-20 DIAGNOSIS — S0101XA Laceration without foreign body of scalp, initial encounter: Secondary | ICD-10-CM | POA: Diagnosis not present

## 2020-12-20 DIAGNOSIS — I5032 Chronic diastolic (congestive) heart failure: Secondary | ICD-10-CM | POA: Diagnosis not present

## 2020-12-20 DIAGNOSIS — E034 Atrophy of thyroid (acquired): Secondary | ICD-10-CM | POA: Diagnosis present

## 2020-12-20 DIAGNOSIS — W19XXXA Unspecified fall, initial encounter: Secondary | ICD-10-CM

## 2020-12-20 DIAGNOSIS — Z888 Allergy status to other drugs, medicaments and biological substances status: Secondary | ICD-10-CM | POA: Diagnosis not present

## 2020-12-20 DIAGNOSIS — Z20822 Contact with and (suspected) exposure to covid-19: Secondary | ICD-10-CM | POA: Insufficient documentation

## 2020-12-20 DIAGNOSIS — M50323 Other cervical disc degeneration at C6-C7 level: Secondary | ICD-10-CM | POA: Diagnosis not present

## 2020-12-20 DIAGNOSIS — S61012A Laceration without foreign body of left thumb without damage to nail, initial encounter: Secondary | ICD-10-CM | POA: Diagnosis not present

## 2020-12-20 DIAGNOSIS — R55 Syncope and collapse: Secondary | ICD-10-CM | POA: Diagnosis not present

## 2020-12-20 DIAGNOSIS — J449 Chronic obstructive pulmonary disease, unspecified: Secondary | ICD-10-CM | POA: Diagnosis not present

## 2020-12-20 DIAGNOSIS — I480 Paroxysmal atrial fibrillation: Secondary | ICD-10-CM | POA: Diagnosis not present

## 2020-12-20 DIAGNOSIS — R296 Repeated falls: Secondary | ICD-10-CM | POA: Insufficient documentation

## 2020-12-20 DIAGNOSIS — Z86718 Personal history of other venous thrombosis and embolism: Secondary | ICD-10-CM | POA: Insufficient documentation

## 2020-12-20 DIAGNOSIS — Z8249 Family history of ischemic heart disease and other diseases of the circulatory system: Secondary | ICD-10-CM | POA: Insufficient documentation

## 2020-12-20 DIAGNOSIS — Z880 Allergy status to penicillin: Secondary | ICD-10-CM | POA: Diagnosis not present

## 2020-12-20 DIAGNOSIS — I951 Orthostatic hypotension: Secondary | ICD-10-CM | POA: Diagnosis present

## 2020-12-20 DIAGNOSIS — Z882 Allergy status to sulfonamides status: Secondary | ICD-10-CM | POA: Insufficient documentation

## 2020-12-20 DIAGNOSIS — I10 Essential (primary) hypertension: Secondary | ICD-10-CM | POA: Diagnosis present

## 2020-12-20 DIAGNOSIS — I6782 Cerebral ischemia: Secondary | ICD-10-CM | POA: Insufficient documentation

## 2020-12-20 LAB — COMPREHENSIVE METABOLIC PANEL
ALT: 18 U/L (ref 0–44)
AST: 26 U/L (ref 15–41)
Albumin: 3.2 g/dL — ABNORMAL LOW (ref 3.5–5.0)
Alkaline Phosphatase: 41 U/L (ref 38–126)
Anion gap: 11 (ref 5–15)
BUN: 24 mg/dL — ABNORMAL HIGH (ref 8–23)
CO2: 24 mmol/L (ref 22–32)
Calcium: 8.7 mg/dL — ABNORMAL LOW (ref 8.9–10.3)
Chloride: 105 mmol/L (ref 98–111)
Creatinine, Ser: 1.66 mg/dL — ABNORMAL HIGH (ref 0.44–1.00)
GFR, Estimated: 30 mL/min — ABNORMAL LOW (ref >60)
Glucose, Bld: 211 mg/dL — ABNORMAL HIGH (ref 70–99)
Potassium: 3.4 mmol/L — ABNORMAL LOW (ref 3.5–5.1)
Sodium: 140 mmol/L (ref 135–145)
Total Bilirubin: 0.6 mg/dL (ref 0.3–1.2)
Total Protein: 5.6 g/dL — ABNORMAL LOW (ref 6.5–8.1)

## 2020-12-20 LAB — C DIFFICILE QUICK SCREEN W PCR REFLEX
C Diff antigen: NEGATIVE
C Diff interpretation: NOT DETECTED
C Diff toxin: NEGATIVE

## 2020-12-20 LAB — LIPASE, BLOOD: Lipase: 39 U/L (ref 11–51)

## 2020-12-20 LAB — RESP PANEL BY RT-PCR (FLU A&B, COVID) ARPGX2
Influenza A by PCR: NEGATIVE
Influenza B by PCR: NEGATIVE
SARS Coronavirus 2 by RT PCR: NEGATIVE

## 2020-12-20 LAB — TROPONIN I (HIGH SENSITIVITY): Troponin I (High Sensitivity): 6 ng/L (ref <18)

## 2020-12-20 MED ORDER — LIDOCAINE-EPINEPHRINE 2 %-1:100000 IJ SOLN
10.0000 mL | Freq: Once | INTRAMUSCULAR | Status: AC
  Administered 2020-12-20: 10 mL
  Filled 2020-12-20: qty 1

## 2020-12-20 MED ORDER — ONDANSETRON HCL 4 MG/2ML IJ SOLN
4.0000 mg | Freq: Once | INTRAMUSCULAR | Status: AC
  Administered 2020-12-20: 4 mg via INTRAVENOUS
  Filled 2020-12-20: qty 2

## 2020-12-20 MED ORDER — SODIUM CHLORIDE 0.9 % IV SOLN: 10.0000 mL/h | Freq: Once | INTRAVENOUS | Status: DC

## 2020-12-20 MED ORDER — PANTOPRAZOLE SODIUM 40 MG IV SOLR
40.0000 mg | Freq: Once | INTRAVENOUS | Status: AC
  Administered 2020-12-20: 40 mg via INTRAVENOUS
  Filled 2020-12-20: qty 40

## 2020-12-20 MED ORDER — LACTATED RINGERS IV BOLUS
500.0000 mL | Freq: Once | INTRAVENOUS | Status: AC
  Administered 2020-12-20: 500 mL via INTRAVENOUS

## 2020-12-20 NOTE — H&P (Signed)
History and Physical    Vanice Rappa Koloski TGG:[id number] DOB: Sep 13, 1932 DOA: 12/20/2020  PCP: Rusty Aus, MD    Patient coming from:  SNF.   Chief Complaint: Fall/ Syncope.    HPI: [name] is a 87 y.o. female seen in ed with complaints of fall on sidewalk. Pt has hematoma on  right forehead and skins tears to both arms, and rt hip pain and hematoma.  HPI as per patient stating that she was walking on the sidewalk when she missed stepped and fell forward striking her head.  She denies any bleeding or loss of consciousness any dizziness any chest pain palpitations tinnitus, any chest pain any weakness.  Review of systems is otherwise negative. HPI is per patient but is limited due to her hearing difficulty.  Pt has history of multiple allergies to medications including amlodipine, ciprofloxacin, doxycycline, Lipitor, morphine, Macrobid, penicillin, sulfa. Patient has past medical history of heart failure, anemia, chronic kidney disease, COPD, depression, hypertension, PAD, arterial leg ulcer,, paroxysmal atrial fibrillation on Eliquis, history of DVT, history of breast cancer, hypothyroidism.   ED Course:  Vitals:   12/21/20 0015 12/21/20 0030 12/21/20 0035 12/21/20 0050  BP:  (!) 151/76 (!) 151/76 (!) 158/71  Pulse: 81 99 97 94  Resp: 17 18 (!) 24 (!) 27  Temp:   97.8 F (36.6 C) 97.8 F (36.6 C)  TempSrc:   Oral Oral  SpO2: (!) 87% 100%    Weight:      Height:      In the emergency room patient had 2 laceration repairs by ED physician and plan was for discharge however prior to discharge patient had a syncopal episode, 1 patient was using the bathroom and attempting to have a bowel movement patient became lightheaded and passed out.  Per ED MD report patient vomited when she woke up and had streaks of blood in her vomitus, no blood was noted on the stool, also due to her history of C. difficile stool was tested and was found to be negative..  Initial EKG shows atrial  fibrillation with a heart rate of 99, right bundle branch block.  CT head and C-spine are negative, blood work showed a hemoglobin of 6.2 and patient was started on 1 unit of PRBC.  Patient's last echocardiogram in 2019 showed a EF of 55 to 60% with atrial fibrillation, left atrium was mildly dilated, mild LVH, no regional wall motion abnormalities. Blood work today showed a glucose of 211, creatinine of 1.66 Lab Results  Component Value Date   CREATININE 1.66 (H) 12/20/2020   CREATININE 1.29 (H) 08/31/2020   CREATININE 1.99 (H) 10/26/2019  LFTs within normal limits, CBC within normal limits. In the ED patient was given half a liter of LR bolus, Zofran, 40 mg of Protonix IV. Review of Systems:  Review of Systems  Unable to perform ROS: Other (hearing difficulty.)  Constitutional: Negative.   HENT: Negative.    Respiratory: Negative.    Cardiovascular: Negative.   Gastrointestinal:  Positive for diarrhea.  Musculoskeletal:  Positive for falls.       Right sided rib pain with deep inspiration  Neurological: Negative.     Past Medical History:  Diagnosis Date   (HFpEF) heart failure with preserved ejection fraction ([place])    a. 12/2015 Echo: EF 60-65%. no rwma, Gr2 DD, triv AI, mild MR, mildly dil LA. Mild-mod TR. PASP 25mmHg.   Anemia    Arthritis    "back, hands" (09/08/2015)  Cancer of right breast ([place]) 2007, 2013   right breast- 2007 radiation-mastectomy   Chronic lower back pain    CKD (chronic kidney disease), stage IV (HCC)    Complication of anesthesia    "took me about 1 week to know what was going on after one of my knee ORs"   COPD (chronic obstructive pulmonary disease) (HCC)    DDD (degenerative disc disease), lumbar    Depression    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   DVT (deep venous thrombosis) (HCC)    Edema    FEET/ANKLES   GERD (gastroesophageal reflux disease)    History of sepsis    History of stress test    a. 08/2010: EF 73%, no  ischemia/infarct.   Hyperlipidemia    Hypertension    Hypothyroid    PAD (peripheral artery disease) ([place])    a. 08/2015 s/p PTA of R AT w/ drug-coated balloon angioplasty to R Popliteal; b. 05/2017 ABI: R 1.16, L 0.88-->stable.   PAF (paroxysmal atrial fibrillation) ([place])    a. 08/2010 s/p DCCV;  b. CHA2DS2VASc = 6-->No OAC 2/2 h/o falls.   PONV (postoperative nausea and vomiting)    nausea vomiting long ago with surgery but not recent ones    Past Surgical History:  Procedure Laterality Date   ABDOMINAL EXPLORATION SURGERY     "had to go back in after hysterectomy & check on stitch dr had put near my bladder; don't know if they took it out; had to wear catheter for 1 month"   [place], ARTERY Right 09/08/2015   superficial femoral   BREAST EXCISIONAL BIOPSY Right 2007   positive   BREAST SURGERY     CARDIOVERSION  11/10/2011   Procedure: CARDIOVERSION;  Surgeon: Lelon Perla, MD;  Location: [facility] OR;  Service: Cardiovascular;  Laterality: N/A;   CARDIOVERSION  08/26/2010   CATARACT EXTRACTION W/PHACO Right 07/17/2017   Procedure: CATARACT EXTRACTION PHACO AND INTRAOCULAR LENS PLACEMENT ([place]);  Surgeon: Birder Robson, MD;  Location: [facility] ORS;  Service: Ophthalmology;  Laterality: Right;  [place] 00:44.0 AP% 14.6 CDE 6.44 FLUID PACK LOT # [id number]   CATARACT EXTRACTION W/PHACO Left 08/07/2017   Procedure: CATARACT EXTRACTION PHACO AND INTRAOCULAR LENS PLACEMENT ([facility]);  Surgeon: Birder Robson, MD;  Location: [facility] ORS;  Service: Ophthalmology;  Laterality: Left;  [place] 00:44 AP% 14.1 CDE 6.32 Fluid pack lot # [id number]   COLONOSCOPY     COLONOSCOPY WITH PROPOFOL N/A 01/04/2016   Procedure: COLONOSCOPY WITH PROPOFOL;  Surgeon: Lollie Sails, MD;  Location: [facility] ENDOSCOPY;  Service: Endoscopy;  Laterality: N/A;   EYE SURGERY     HALLUX VALGUS CORRECTION     JOINT REPLACEMENT     KYPHOPLASTY N/A 06/25/2018    Procedure: KYPHOPLASTY L3;  Surgeon: Hessie Knows, MD;  Location: [facility] ORS;  Service: Orthopedics;  Laterality: N/A;   LUMBAR DISC SURGERY     MASTECTOMY Right 2013   positive   PERIPHERAL VASCULAR CATHETERIZATION N/A 09/08/2015   Procedure: Abdominal Aortogram w/Lower Extremity;  Surgeon: Wellington Hampshire, MD;  Location: [place] CV LAB;  Service: Cardiovascular;  Laterality: N/A;   PERIPHERAL VASCULAR CATHETERIZATION  09/08/2015   Procedure: Peripheral Vascular Balloon Angioplasty;  Surgeon: Wellington Hampshire, MD;  Location: [place] CV LAB;  Service: Cardiovascular;;   REVISION TOTAL KNEE ARTHROPLASTY Right    rt.ankle ulcer surgery  2013   TEMPOROMANDIBULAR JOINT ARTHROPLASTY     TMJ ARTHROPLASTY     TONSILLECTOMY     TOTAL KNEE ARTHROPLASTY Right    TOTAL KNEE ARTHROPLASTY Left 02/26/2018   Procedure: TOTAL KNEE ARTHROPLASTY;  Surgeon: Corky Mull, MD;  Location: [facility] ORS;  Service: Orthopedics;  Laterality: Left;   VASCULAR SURGERY       reports that she has never smoked. She has never used smokeless tobacco. She reports that she does not drink alcohol and does not use drugs.  Allergies  Allergen Reactions   Other Other (See Comments)    NO BP, VENIPUNCTURE, OR ACCESS IN RIGHT ARM - S/P MASTECTOMY ON RIGHT   Amlodipine Nausea And Vomiting   Ciprofloxacin Nausea Only   Doxycycline Itching and Other (See Comments)    shaky   Lipitor [Atorvastatin] Other (See Comments)    Joint pain    Morphine And Related Nausea And Vomiting   Macrobid [Nitrofurantoin Macrocrystal] Rash   Penicillins Rash and Other (See Comments)    Has patient had a PCN reaction causing immediate rash, facial/tongue/throat swelling, SOB or lightheadedness with hypotension: yes Has patient had a PCN reaction causing severe rash involving mucus membranes or skin necrosis: no Has patient had a PCN reaction that required hospitalization no Has patient had a PCN reaction occurring within the last 10 years:  no If all of the above answers are "NO", then may proceed with Cephalosporin use.    Sulfa Antibiotics Rash    Family History  Problem Relation Age of Onset   Heart attack Father    Coronary artery disease Sister 28       MI   Breast cancer Maternal Aunt     Prior to Admission medications   Medication Sig Start Date End Date Taking? Authorizing Provider  acetaminophen (TYLENOL) 325 MG tablet Take 650 mg by mouth every 4 (four) hours as needed (pain).  03/03/18   [provider]  acetaminophen (TYLENOL) 500 MG tablet Take 1,000 mg by mouth at bedtime as needed for moderate pain or headache.    [provider]  amiodarone (PACERONE) 200 MG tablet Take 1 tablet (200 mg total) by mouth daily. 03/19/18   Gerlene Fee, NP  apixaban (ELIQUIS) 5 MG TABS tablet Take 1 tablet (5 mg total) by mouth 2 (two) times daily. 03/19/18   Gerlene Fee, NP  aspirin EC 81 MG tablet Take 81 mg by mouth daily.    [provider]  calcium-vitamin D (OSCAL WITH D) 500-200 MG-UNIT per tablet Take 1 tablet by mouth daily with breakfast.    [provider]  Cholecalciferol (VITAMIN D3) 5000 units CAPS Take 5,000 Units by mouth daily.     [provider]  dicyclomine (BENTYL) 10 MG capsule Take 1 capsule (10 mg total) by mouth 4 (four) times daily for 5 days. 08/31/20 09/05/20  Lucrezia Starch, MD  docusate sodium (COLACE) 100 MG capsule Take 100 mg by mouth daily as needed for mild constipation.    [provider]  donepezil (ARICEPT) 5 MG tablet Take 5 mg by mouth at bedtime. 04/15/18   [provider]  ferrous sulfate 325 (65 FE) MG tablet Take 1 tablet (325 mg total) by mouth daily with breakfast. 03/19/18   Gerlene Fee, NP  guaifenesin (ROBITUSSIN) 100 MG/5ML syrup Take 200 mg by mouth 3 (three) times daily as needed for cough.    [provider]  lactose free nutrition (BOOST) LIQD  Take 237 mLs by mouth daily.    [provider]  levothyroxine (SYNTHROID, LEVOTHROID) 150 MCG tablet Take 1 tablet (150 mcg total) by mouth daily before breakfast. 03/19/18   Gerlene Fee, NP  lisinopril (PRINIVIL,ZESTRIL) 10 MG tablet Take 1 tablet (10 mg total) by mouth daily. 03/19/18   Gerlene Fee, NP  Multiple Vitamins-Minerals (CENTROVITE) TABS Take 1 tablet by mouth daily. 03/04/18   [provider]  NON FORMULARY Diet type: NAS, Regular consistency, thin liquids ok    [provider]  pantoprazole (PROTONIX) 40 MG tablet Take 1 tablet (40 mg total) by mouth daily. 03/19/18   Gerlene Fee, NP  PARoxetine (PAXIL) 20 MG tablet Take 1 tablet (20 mg total) by mouth daily. 03/19/18   Gerlene Fee, NP  potassium chloride (K-DUR) 10 MEQ tablet Take 1 tablet (10 mEq total) by mouth 2 (two) times daily. 03/19/18   Gerlene Fee, NP  rosuvastatin (CRESTOR) 10 MG tablet Take 1 tablet (10 mg total) by mouth daily. 03/19/18   Gerlene Fee, NP  spironolactone (ALDACTONE) 25 MG tablet Take 1 tablet (25 mg total) by mouth daily. 03/19/18   Gerlene Fee, NP  torsemide (DEMADEX) 20 MG tablet Take 1 tablet (20 mg total) by mouth daily. 03/19/18   Gerlene Fee, NP  Wound Dressings (ALLEVYN GENTLE BORDER EX) Apply 1 patch topically every 3 (three) days. Apply to left lower extremity every 3 days cleanse with saline; apply skin prep to surrounding skin and apply patch every 3 days.     [provider]    Physical Exam: Vitals:   12/21/20 0015 12/21/20 0030 12/21/20 0035 12/21/20 0050  BP:  (!) 151/76 (!) 151/76 (!) 158/71  Pulse: 81 99 97 94  Resp: 17 18 (!) 24 (!) 27  Temp:   97.8 F (36.6 C) 97.8 F (36.6 C)  TempSrc:   Oral Oral  SpO2: (!) 87% 100%    Weight:      Height:       Physical Exam Vitals and nursing note reviewed.  Constitutional:      General: She is not in acute distress.    Appearance: Normal appearance. She is not ill-appearing, toxic-appearing or diaphoretic.   HENT:     Head: Normocephalic and atraumatic. Right periorbital erythema present.     Right Ear: External ear normal. Decreased hearing noted.     Left Ear: External ear normal. Decreased hearing noted.     Nose: Nose normal.     Mouth/Throat:     Mouth: Mucous membranes are moist.  Eyes:     Extraocular Movements: Extraocular movements intact.     Pupils: Pupils are equal, round, and reactive to light.  Cardiovascular:     Rate and Rhythm: Normal rate. Rhythm irregular.     Pulses: Normal pulses.  Pulmonary:     Effort: Pulmonary effort is normal.     Breath sounds: Normal breath sounds.  Abdominal:     General: Bowel sounds are normal. There is no distension.     Palpations: Abdomen is soft.     Tenderness: There is no abdominal tenderness. There is no guarding.  Musculoskeletal:     Right lower leg: No edema.     Left lower leg: No edema.  Skin:    General: Skin is warm.     Findings: Bruising, ecchymosis and laceration present.  Neurological:     General: No focal deficit present.  Mental Status: She is alert and oriented to person, place, and time.  Psychiatric:        Mood and Affect: Mood normal.        Behavior: Behavior normal. Behavior is cooperative.     Labs on Admission: I have personally reviewed following labs and imaging studies  No results for input(s): CKTOTAL, CKMB, TROPONINI in the last 72 hours. Lab Results  Component Value Date   WBC 9.0 12/20/2020   HGB 6.2 (L) 12/20/2020   HCT 19.3 (L) 12/20/2020   MCV 110.3 (H) 12/20/2020   PLT 134 (L) 12/20/2020    Recent Labs  Lab 12/20/20 2217  NA 140  K 3.4*  CL 105  CO2 24  BUN 24*  CREATININE 1.66*  CALCIUM 8.7*  PROT 5.6*  BILITOT 0.6  ALKPHOS 41  ALT 18  AST 26  GLUCOSE 211*    COVID-19 Labs No results for input(s): DDIMER, FERRITIN, LDH, CRP in the last 72 hours. Lab Results  Component Value Date   [place] NEGATIVE 12/20/2020    Radiological Exams on Admission: CT Head  Wo Contrast  Result Date: 12/20/2020 CLINICAL DATA:  Head trauma, minor (Age >= 65y) Laceration to head. EXAM: CT HEAD WITHOUT CONTRAST TECHNIQUE: Contiguous axial images were obtained from the base of the skull through the vertex without intravenous contrast. COMPARISON:  Head CT 10/26/2019 FINDINGS: Brain: Age related atrophy with mild chronic small vessel ischemia. No intracranial hemorrhage, mass effect, or midline shift. No hydrocephalus. The basilar cisterns are patent. Remote lacunar infarct in the right basal ganglia. No evidence of territorial infarct or acute ischemia. No extra-axial or intracranial fluid collection. Vascular: Atherosclerosis of skullbase vasculature without hyperdense vessel or abnormal calcification. Skull: No fracture or focal lesion. Sinuses/Orbits: No fracture or acute findings. Included paranasal sinuses are clear. Postsurgical change of the right zygomatic temporal buttress. Bilateral cataract resection. Other: Right frontal scalp hematoma. IMPRESSION: 1. Right frontal scalp hematoma. No acute intracranial abnormality. No skull fracture. 2. Age related atrophy and chronic small vessel ischemia. Remote lacunar infarct in the right basal ganglia. Electronically Signed   By: Keith Rake M.D.   On: 12/20/2020 20:15   CT Cervical Spine Wo Contrast  Result Date: 12/20/2020 CLINICAL DATA:  Neck trauma (Age >= 65y) Fall striking head. EXAM: CT CERVICAL SPINE WITHOUT CONTRAST TECHNIQUE: Multidetector CT imaging of the cervical spine was performed without intravenous contrast. Multiplanar CT image reconstructions were also generated. COMPARISON:  Cervical spine CT 01/12/2016 FINDINGS: Alignment: Stable degenerative type grade 1 anterolisthesis of C3 on C4 and C4 on C5. Mild broad-based leftward curvature may be positioning and was not seen on prior exam. No traumatic subluxation. Skull base and vertebrae: No acute fracture. Vertebral body heights are maintained. The dens and skull  base are intact. Soft tissues and spinal canal: No prevertebral fluid or swelling. No visible canal hematoma. Disc levels: Degenerative disc disease, most prominent at C6-C7. There is prominent multifocal facet hypertrophy. Mild dorsal calcifications in the spinal canal at the level of C3-C4, stable from prior. No severe spinal canal narrowing. Upper chest: No acute or unexpected findings. 5 mm right apical pulmonary nodule stable from 2017 and considered benign, no further follow-up is needed. Other: Mild carotid calcifications. IMPRESSION: 1. No acute fracture or subluxation of the cervical spine. 2. Multilevel degenerative disc disease and facet hypertrophy. Electronically Signed   By: Keith Rake M.D.   On: 12/20/2020 20:21    EKG: Independently reviewed.  A.fib 99.  Echocardiogram  2019: Study Conclusions   - Left ventricle: The cavity size was normal. There was mild    concentric hypertrophy. Systolic function was normal. The    estimated ejection fraction was in the range of 55% to 60%. Wall    motion was normal; there were no regional wall motion    abnormalities. The study is not technically sufficient to allow    evaluation of LV diastolic function.  - Mitral valve: Calcified annulus. There was mild regurgitation.  - Left atrium: The atrium was mildly dilated.  - Pulmonary arteries: Systolic pressure was within the normal    range.     Assessment/Plan Principal Problem:   Syncope and collapse Active Problems:   PAF (paroxysmal atrial fibrillation) (HCC)   Hypertension   Renal insufficiency   Chronic diastolic congestive heart failure (HCC)   Orthostatic hypotension   GERD without esophagitis   Anemia   Hypothyroidism due to acquired atrophy of thyroid Syncope/ Collapse: We will admit with fall and aspiration precaution to med surg with telemetry.  D/d include orthostatics related to diarrhea / BP meds / anemia related to GIB.  Neuro check however exam so far is  nonfocal less likely this is a TIA or neuro event and pt is anticoagulated with eliquis making it less likely.  Orthostatic vitals.   PAF: Eliqis and ASA held due to anemia; Amiodarone continued. Currently rate controlled.   HTN: Blood pressure (!) 158/71, pulse 94, temperature 97.8 F (36.6 C), temperature source Oral, resp. rate (!) 27, height 5\' 8"  (1.727 m), weight 59 kg, SpO2 100 %. Lisinopril decreased to 2.5 mg. Aldactone continued at 25 mg.   Renal insufficiency/ CKD: Lab Results  Component Value Date   CREATININE 1.66 (H) 12/20/2020   CREATININE 1.29 (H) 08/31/2020   CREATININE 1.99 (H) 10/26/2019  Cut down lisinopril to 2.5 .  Hold torsemide until orthostatic check and resume if normal.  Attribute intermittent variation to additional diuretic for CHF.     C/H CHF: Pt receiving one LT LR in ed.  Strict I/O.  Orthostatic Hypotension: Orthostatic vitals and if positive hold lisinopril and aldactone.  Fall precaution.  PT consult.   GERD:  IV ppi.  Anemia: Anemia panel/ stool guaiac/ iv ppi/ T/X/T -1 unit prbc in ed. D/d also include bleeding into her rt hip  hematoma we will d guaiac and anemia panel .  Hypothyroidism: Ft4/ tsh. Cont levothyroxine.  Clear liquids diet.     DVT prophylaxis:  SCD's   Code Status:  Full code   Family Communication:  Caisse, Remo Lipps (Son)  [phone] [facility])   Disposition Plan:  SNF   Consults called:  None   Admission status: Inpatient.     Para Skeans MD [facility] [phone] How to contact the [facility] Attending or Consulting provider [place] or covering provider during after hours [place], for this patient.    Check the care team in [facility] and look for a) attending/consulting [facility] provider listed and b) the [facility] team listed Log into [email or web address] and use [place]'s universal password to access. If you do not have the password, please contact the hospital operator. Locate the [facility] provider you are  looking for under [facility] and page to a number that you can be directly reached. If you still have difficulty reaching the provider, please page the [facility] (Director on Call) for the Hospitalists listed on amion for assistance. [email or web address] Password [email or web address] 12/21/2020, 1:01 AM

## 2020-12-20 NOTE — ED Notes (Signed)
Pt asked for assistance to the toilet. Once on the toilet, pt felt dizzy & had a syncopal episode-this RN remained with pt entire time. This RN called for help & pt assisted back to stretcher. MD notified. Pt had episodes of vomiting & liquid stool while getting back to bed. Stool sent to lab. Blood work sent to lab. EKG completed. Pt placed back on cardiac, BP, pulse ox monitors after being cleaned up & changed into new gown. Bed low & Locked, call light within reach; son at [facility].

## 2020-12-20 NOTE — ED Provider Notes (Addendum)
[facility] Emergency Department Provider Note   ____________________________________________   Event Date/Time   First MD Initiated Contact with Patient 12/20/20 1946     (approximate)  I have reviewed the triage vital signs and the nursing notes.   HISTORY  Chief Complaint Fall (Pt had a mechanical fall on the sidewalk-trip & fall. Hematoma to R forehead; skin tears to b/l arms. Pt reports R hip pain & is getting a HA.)    HPI [name] is a 87 y.o. female with past medical history of hypertension, hyperlipidemia, paroxysmal atrial fibrillation on Eliquis, CHF, CKD, peripheral arterial disease, and DVT who presents to the ED for fall.  Patient reports that she was walking outside on the sidewalk when she stubbed her toe on a step, causing herself to fall forward and strike her head.  She denies losing consciousness, but did notice a significant amount of bleeding from her right forehead.  EMS additionally noted multiple skin tears to her left hand, right forearm, and right hand.  She denies any pain in her extremities and has not had any difficulty moving her extremities since the fall.  She denies any pain in her neck, chest, or abdomen.        Past Medical History:  Diagnosis Date   (HFpEF) heart failure with preserved ejection fraction ([place])    a. 12/2015 Echo: EF 60-65%. no rwma, Gr2 DD, triv AI, mild MR, mildly dil LA. Mild-mod TR. PASP 65mmHg.   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 08/2010: EF 73%, no ischemia/infarct.   Hyperlipidemia    Hypertension    Hypothyroid    PAD (peripheral artery disease) ([place])    a. 08/2015 s/p PTA of R AT w/ drug-coated balloon angioplasty to R Popliteal; b. 05/2017 ABI: R 1.16, L 0.88-->stable.   PAF (paroxysmal atrial fibrillation) ([place])    a. 08/2010 s/p DCCV;  b. CHA2DS2VASc = 6-->No OAC 2/2 h/o falls.   PONV (postoperative nausea and vomiting)    nausea vomiting long ago with surgery but not recent ones    Patient Active Problem List   Diagnosis Date Noted   Acute deep vein thrombosis (DVT) of left peroneal vein ([place]) 03/06/2018   Arterial leg ulcer ([place]) 03/06/2018   Hypertensive heart and kidney disease with chronic diastolic congestive heart failure and stage 3 chronic kidney disease ([place]) 03/06/2018   GERD without esophagitis 03/06/2018   CKD (chronic kidney disease) stage 3, GFR 30-59 ml/min (HCC) 03/06/2018   Anemia associated with stage 3 chronic renal failure ([place]) 03/06/2018   Hypothyroidism due to acquired atrophy of thyroid 03/06/2018   Dyslipidemia 03/06/2018   Primary osteoarthritis of left knee 03/06/2018   Status post total knee replacement  using cement, left 02/26/2018   Orthostatic hypotension 01/14/2016   ARF (acute renal failure) ([place]) 01/14/2016   Dehydration 01/14/2016   Protein-calorie malnutrition, severe 01/12/2016   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   PAD (peripheral artery disease) ([place]) 08/10/2014   Chronic diastolic congestive heart failure ([place]) 02/26/2014   Renal insufficiency 09/14/2011   PAF (paroxysmal atrial fibrillation) ([place]) 08/09/2010   Hypertension 08/09/2010    Past Surgical History:  Procedure Laterality Date   ABDOMINAL EXPLORATION SURGERY     "had to go back in after hysterectomy & check on stitch dr had put near my  bladder; don't know if they took it out; had to wear catheter for 1 month"   [place], ARTERY Right 09/08/2015   superficial femoral   BREAST EXCISIONAL BIOPSY Right 2007   positive   BREAST SURGERY     CARDIOVERSION  11/10/2011   Procedure: CARDIOVERSION;  Surgeon: Lelon Perla, MD;  Location: [place];  Service: Cardiovascular;  Laterality: N/A;   CARDIOVERSION  08/26/2010   CATARACT EXTRACTION W/PHACO Right 07/17/2017   Procedure: CATARACT EXTRACTION PHACO AND INTRAOCULAR LENS PLACEMENT ([place]);  Surgeon: Birder Robson, MD;  Location: [facility];  Service: Ophthalmology;  Laterality: Right;  [place] 00:44.0 AP% 14.6 CDE 6.44 FLUID PACK LOT # [id number]   CATARACT EXTRACTION W/PHACO Left 08/07/2017   Procedure: CATARACT EXTRACTION PHACO AND INTRAOCULAR LENS PLACEMENT (I[facility]);  Surgeon: Birder Robson, MD;  Location: [facility];  Service: Ophthalmology;  Laterality: Left;  [place] 00:44 AP% 14.1 CDE 6.32 Fluid pack lot # [id number]   COLONOSCOPY     COLONOSCOPY WITH PROPOFOL N/A 01/04/2016   Procedure: COLONOSCOPY WITH PROPOFOL;  Surgeon: Lollie Sails, MD;  Location: [facility] ENDOSCOPY;  Service: Endoscopy;  Laterality: N/A;   EYE SURGERY     HALLUX VALGUS CORRECTION     JOINT REPLACEMENT     KYPHOPLASTY N/A 06/25/2018   Procedure: KYPHOPLASTY L3;  Surgeon: Hessie Knows, MD;  Location: [facility];  Service: Orthopedics;  Laterality: N/A;   LUMBAR DISC SURGERY     MASTECTOMY Right 2013   positive   PERIPHERAL VASCULAR CATHETERIZATION N/A 09/08/2015   Procedure: Abdominal Aortogram w/Lower Extremity;  Surgeon: Wellington Hampshire, MD;  Location: [place] [facility];  Service: Cardiovascular;  Laterality: N/A;   PERIPHERAL VASCULAR CATHETERIZATION  09/08/2015   Procedure: Peripheral Vascular Balloon Angioplasty;  Surgeon: Wellington Hampshire, MD;  Location: [place] [facility];  Service: Cardiovascular;;   REVISION TOTAL KNEE ARTHROPLASTY  Right    rt.ankle ulcer surgery  2013   TEMPOROMANDIBULAR JOINT ARTHROPLASTY     TMJ ARTHROPLASTY     TONSILLECTOMY     TOTAL KNEE ARTHROPLASTY Right    TOTAL KNEE ARTHROPLASTY Left 02/26/2018   Procedure: TOTAL KNEE ARTHROPLASTY;  Surgeon: Corky Mull, MD;  Location: [facility];  Service: Orthopedics;  Laterality: Left;   VASCULAR SURGERY      Prior to Admission medications   Medication Sig Start Date End Date Taking? Authorizing Provider  acetaminophen (TYLENOL) 325 MG tablet Take 650 mg by mouth every 4 (four) hours as needed (pain).  03/03/18   [provider]  acetaminophen (TYLENOL) 500 MG tablet Take 1,000 mg by mouth at bedtime as needed for moderate pain or headache.    [provider]  amiodarone (PACERONE) 200 MG tablet Take 1 tablet (200 mg total) by mouth  daily. 03/19/18   Gerlene Fee, NP  apixaban (ELIQUIS) 5 MG TABS tablet Take 1 tablet (5 mg total) by mouth 2 (two) times daily. 03/19/18   Gerlene Fee, NP  aspirin EC 81 MG tablet Take 81 mg by mouth daily.    [provider]  calcium-vitamin D (OSCAL WITH D) 500-200 MG-UNIT per tablet Take 1 tablet by mouth daily with breakfast.    [provider]  Cholecalciferol (VITAMIN D3) 5000 units CAPS Take 5,000 Units by mouth daily.     [provider]  dicyclomine (BENTYL) 10 MG capsule Take 1 capsule (10 mg total) by mouth 4 (four) times daily for 5 days. 08/31/20 09/05/20  Lucrezia Starch, MD  docusate sodium (COLACE) 100 MG capsule Take 100 mg by mouth daily as needed for mild constipation.    [provider]  donepezil (ARICEPT) 5 MG tablet Take 5 mg by mouth at bedtime. 04/15/18   [provider]  ferrous sulfate 325 (65 FE) MG tablet Take 1 tablet (325 mg total) by mouth daily with breakfast. 03/19/18   Gerlene Fee, NP  guaifenesin (ROBITUSSIN) 100 MG/5ML syrup Take 200 mg by mouth 3 (three) times daily as needed for cough.    [provider]   lactose free nutrition (BOOST) LIQD Take 237 mLs by mouth daily.    [provider]  levothyroxine (SYNTHROID, LEVOTHROID) 150 MCG tablet Take 1 tablet (150 mcg total) by mouth daily before breakfast. 03/19/18   Gerlene Fee, NP  lisinopril (PRINIVIL,ZESTRIL) 10 MG tablet Take 1 tablet (10 mg total) by mouth daily. 03/19/18   Gerlene Fee, NP  Multiple Vitamins-Minerals (CENTROVITE) TABS Take 1 tablet by mouth daily. 03/04/18   [provider]  NON FORMULARY Diet type: NAS, Regular consistency, thin liquids ok    [provider]  pantoprazole (PROTONIX) 40 MG tablet Take 1 tablet (40 mg total) by mouth daily. 03/19/18   Gerlene Fee, NP  PARoxetine (PAXIL) 20 MG tablet Take 1 tablet (20 mg total) by mouth daily. 03/19/18   Gerlene Fee, NP  potassium chloride (K-DUR) 10 MEQ tablet Take 1 tablet (10 mEq total) by mouth 2 (two) times daily. 03/19/18   Gerlene Fee, NP  rosuvastatin (CRESTOR) 10 MG tablet Take 1 tablet (10 mg total) by mouth daily. 03/19/18   Gerlene Fee, NP  spironolactone (ALDACTONE) 25 MG tablet Take 1 tablet (25 mg total) by mouth daily. 03/19/18   Gerlene Fee, NP  torsemide (DEMADEX) 20 MG tablet Take 1 tablet (20 mg total) by mouth daily. 03/19/18   Gerlene Fee, NP  Wound Dressings (ALLEVYN GENTLE BORDER EX) Apply 1 patch topically every 3 (three) days. Apply to left lower extremity every 3 days cleanse with saline; apply skin prep to surrounding skin and apply patch every 3 days.     [provider]    Allergies Other, Amlodipine, Ciprofloxacin, Doxycycline, Lipitor [atorvastatin], Morphine and related, Macrobid [nitrofurantoin macrocrystal], Penicillins, and Sulfa antibiotics  Family History  Problem Relation Age of Onset   Heart attack Father    Coronary artery disease Sister 50       MI   Breast cancer Maternal Aunt     Social History Social History   Tobacco Use   Smoking status: Never   Smokeless  tobacco: Never  Vaping Use   Vaping Use: Never used  Substance Use Topics   Alcohol use: No   Drug use: No  Review of Systems  Constitutional: No fever/chills Eyes: No visual changes. ENT: No sore throat. Cardiovascular: Denies chest pain. Respiratory: Denies shortness of breath. Gastrointestinal: No abdominal pain.  No nausea, no vomiting.  No diarrhea.  No constipation. Genitourinary: Negative for dysuria. Musculoskeletal: Negative for back pain. Skin: Negative for rash.  Positive for skin tears. Neurological: Positive for headaches, negative for focal weakness or numbness.  ____________________________________________   PHYSICAL EXAM:  VITAL SIGNS: ED Triage Vitals  Enc Vitals Group     BP      Pulse      Resp      Temp      Temp src      SpO2      Weight      Height      Head Circumference      Peak Flow      Pain Score      Pain Loc      Pain Edu?      Excl. in [place]?     Constitutional: Alert and oriented. Eyes: Conjunctivae are normal. Head: Large hematoma to right frontal scalp with overlying 2 cm laceration, no active bleeding noted.  No scalp step-off noted. Nose: No congestion/rhinnorhea. Mouth/Throat: Mucous membranes are moist. Neck: Normal ROM, no midline cervical spine tenderness to palpation.  Cardiovascular: Normal rate, regular rhythm. Grossly normal heart sounds.  2+ radial pulses bilaterally. Respiratory: Normal respiratory effort.  No retractions. Lungs CTAB.  No chest wall tenderness to palpation. Gastrointestinal: Soft and nontender. No distention. Genitourinary: deferred Musculoskeletal: No lower extremity tenderness nor edema.  No upper extremity bony tenderness to palpation, range of motion intact without discomfort. Neurologic:  Normal speech and language. No gross focal neurologic deficits are appreciated. Skin:  Skin is warm and dry.  Skin tears noted to left hand, right forearm, and right hand. Psychiatric: Mood and affect are  normal. Speech and behavior are normal.  ____________________________________________   LABS (all labs ordered are listed, but only abnormal results are displayed)  Labs Reviewed  CBC WITH DIFFERENTIAL/PLATELET - Abnormal; Notable for the following components:      Result Value   RBC 1.75 (*)    Hemoglobin 6.2 (*)    HCT 19.3 (*)    MCV 110.3 (*)    MCH 35.4 (*)    Platelets 134 (*)    All other components within normal limits  COMPREHENSIVE METABOLIC PANEL - Abnormal; Notable for the following components:   Potassium 3.4 (*)    Glucose, Bld 211 (*)    BUN 24 (*)    Creatinine, Ser 1.66 (*)    Calcium 8.7 (*)    Total Protein 5.6 (*)    Albumin 3.2 (*)    GFR, Estimated 30 (*)    All other components within normal limits  RESP PANEL BY RT-PCR (FLU A&B, COVID) ARPGX2  GASTROINTESTINAL PANEL BY PCR, STOOL (REPLACES STOOL CULTURE)  C DIFFICILE QUICK SCREEN W PCR REFLEX    LIPASE, BLOOD  PREPARE RBC (CROSSMATCH)  TYPE AND SCREEN  TROPONIN I (HIGH SENSITIVITY)     PROCEDURES  Procedure(s) performed (including Critical Care):  Marland KitchenMarland KitchenLaceration Repair  Date/Time: 12/20/2020 9:49 PM Performed by: Blake Divine, MD Authorized by: Blake Divine, MD   Consent:    Consent obtained:  Verbal   Consent given by:  Patient   Risks, benefits, and alternatives were discussed: yes   Universal protocol:    Patient identity confirmed:  Verbally with patient and arm band Anesthesia:  Anesthesia method:  Local infiltration   Local anesthetic:  Lidocaine 1% WITH epi Laceration details:    Location:  Scalp   Scalp location:  Frontal   Length (cm):  2 Pre-procedure details:    Preparation:  Patient was prepped and draped in usual sterile fashion and imaging obtained to evaluate for foreign bodies Exploration:    Limited defect created (wound extended): no     Hemostasis achieved with:  Epinephrine   Imaging obtained comment:  CT   Imaging outcome: foreign body not noted      Wound exploration: wound explored through full range of motion and entire depth of wound visualized     Contaminated: no   Treatment:    Area cleansed with:  Saline   Amount of cleaning:  Standard   Irrigation solution:  Sterile saline   Irrigation method:  Pressure wash   Visualized foreign bodies/material removed: no     Debridement:  None   Undermining:  None   Scar revision: no   Skin repair:    Repair method:  Sutures   Suture size:  4-0   Suture material:  Nylon   Suture technique:  Simple interrupted   Number of sutures:  2 Approximation:    Approximation:  Loose Repair type:    Repair type:  Simple Post-procedure details:    Dressing:  Open (no dressing)   Procedure completion:  Tolerated well, no immediate complications .Marland KitchenLaceration Repair  Date/Time: 12/20/2020 9:50 PM Performed by: Blake Divine, MD Authorized by: Blake Divine, MD   Consent:    Consent obtained:  Verbal   Consent given by:  Patient   Risks, benefits, and alternatives were discussed: yes   Universal protocol:    Patient identity confirmed:  Verbally with patient and arm band Anesthesia:    Anesthesia method:  Local infiltration   Local anesthetic:  Lidocaine 1% WITH epi Laceration details:    Location:  Finger   Finger location:  L thumb   Length (cm):  2 Pre-procedure details:    Preparation:  Patient was prepped and draped in usual sterile fashion Exploration:    Limited defect created (wound extended): no     Hemostasis achieved with:  Direct pressure   Imaging outcome: foreign body not noted     Wound exploration: wound explored through full range of motion and entire depth of wound visualized     Contaminated: no   Treatment:    Area cleansed with:  Saline   Amount of cleaning:  Standard   Irrigation solution:  Sterile saline   Visualized foreign bodies/material removed: no     Debridement:  None   Undermining:  None   Scar revision: no   Skin repair:    Repair method:   Sutures   Suture size:  4-0   Suture material:  Nylon   Suture technique:  Simple interrupted   Number of sutures:  2 Approximation:    Approximation:  Loose Repair type:    Repair type:  Simple Post-procedure details:    Dressing:  Open (no dressing)   Procedure completion:  Tolerated well, no immediate complications .Critical Care  Date/Time: 12/20/2020 11:13 PM Performed by: Blake Divine, MD Authorized by: Blake Divine, MD   Critical care provider statement:    Critical care time (minutes):  45   Critical care time was exclusive of:  Separately billable procedures and treating other patients and teaching time   Critical care was necessary to treat or prevent imminent  or life-threatening deterioration of the following conditions:  Circulatory failure   Critical care was time spent personally by me on the following activities:  Discussions with consultants, evaluation of patient's response to treatment, examination of patient, ordering and performing treatments and interventions, ordering and review of laboratory studies, ordering and review of radiographic studies, pulse oximetry, re-evaluation of patient's condition, obtaining history from patient or surrogate and review of old charts   I assumed direction of critical care for this patient from another provider in my specialty: no     Care discussed with: admitting provider    ED ECG REPORT I, Blake Divine, the attending physician, personally viewed and interpreted this ECG.   Date: 12/20/2020  EKG Time: 22:13  Rate: 99  Rhythm: atrial fibrillation  Axis: Normal  Intervals:right bundle branch block  ST&T Change: None  ____________________________________________   INITIAL IMPRESSION / ASSESSMENT AND PLAN / ED COURSE      87 year old female with past medical history of hypertension, hyperlipidemia, atrial fibrillation on Eliquis, CHF, CKD, peripheral arterial disease, and DVT who presents to the ED following  mechanical fall where she tripped on a step, falling forward and striking her head.  She did not lose consciousness and has no focal neurologic deficits on exam.  We will further assess with CT head and cervical spine.  No evidence of injury to her trunk or extremities beyond skin tears.  Skin tears will be cleaned and redressed with Xeroform and gauze.  CT head and cervical spine are negative for acute process.  Lacerations to frontal scalp and left thumb were repaired, skin tears dressed.  Patient is appropriate for discharge back to nursing facility, was counseled to follow-up with PCP in 1 week for suture removal.  Patient and son agree with plan.  ----------------------------------------- 10:09 PM on 12/20/2020 ----------------------------------------- Prior to discharge, patient was assisted to the bathroom and while attempting to have a bowel movement, became very lightheaded and passed out.  When she woke up, she vomited multiple times, found to have a small amount of blood in her emesis but no blood in her stool.  EKG shows atrial fibrillation with controlled rate, no ischemic changes noted.  Labs were sent and remarkable for anemia with hemoglobin of 6.2.  Patient with no further emesis containing blood or bloody stools and I have low suspicion for significant GI bleed at this time.  She does have a very large hematoma to her right thigh, which could contribute to her drop in hemoglobin.  We will transfuse 1 unit PRBC and plan to discuss with hospitalist for admission.      ____________________________________________   FINAL CLINICAL IMPRESSION(S) / ED DIAGNOSES  Final diagnoses:  Fall, initial encounter  Laceration of scalp, initial encounter  Laceration of left thumb without foreign body without damage to nail, initial encounter  Syncope, unspecified syncope type  Anemia, unspecified type     ED Discharge Orders     None        Note:  This document was prepared using  Dragon voice recognition software and may include unintentional dictation errors.    Blake Divine, MD 12/20/20 [id number]    Blake Divine, MD 12/20/20 [phone]

## 2020-12-20 NOTE — ED Notes (Signed)
Pt resting comfortably in bed, NAD. No needs verbalized at this time. Bed low & locked, call light within reach.

## 2020-12-20 NOTE — ED Notes (Signed)
This RN updated son, who is now at [facility]. Son would also like to speak with the MD. Msg sent to MD re: son's request.

## 2020-12-21 ENCOUNTER — Other Ambulatory Visit: Payer: Self-pay

## 2020-12-21 ENCOUNTER — Encounter: Payer: Self-pay | Admitting: Internal Medicine

## 2020-12-21 DIAGNOSIS — R55 Syncope and collapse: Secondary | ICD-10-CM | POA: Diagnosis not present

## 2020-12-21 LAB — GASTROINTESTINAL PANEL BY PCR, STOOL (REPLACES STOOL CULTURE)

## 2020-12-21 LAB — CBC
HCT: 33.5 % — ABNORMAL LOW (ref 36.0–46.0)
Hemoglobin: 11.3 g/dL — ABNORMAL LOW (ref 12.0–15.0)
MCH: 33.6 pg (ref 26.0–34.0)
MCHC: 33.7 g/dL (ref 30.0–36.0)
MCV: 99.7 fL (ref 80.0–100.0)
Platelets: 188 10*3/uL (ref 150–400)
RBC: 3.36 MIL/uL — ABNORMAL LOW (ref 3.87–5.11)
RDW: 15.9 % — ABNORMAL HIGH (ref 11.5–15.5)
WBC: 10.7 10*3/uL — ABNORMAL HIGH (ref 4.0–10.5)
nRBC: 0 % (ref 0.0–0.2)

## 2020-12-21 LAB — CBC WITH DIFFERENTIAL/PLATELET
Abs Immature Granulocytes: 0.05 10*3/uL (ref 0.00–0.07)
Basophils Absolute: 0 10*3/uL (ref 0.0–0.1)
Basophils Relative: 0 %
Eosinophils Absolute: 0.1 10*3/uL (ref 0.0–0.5)
Eosinophils Relative: 1 %
HCT: 19.3 % — ABNORMAL LOW (ref 36.0–46.0)
Hemoglobin: 6.2 g/dL — ABNORMAL LOW (ref 12.0–15.0)
Immature Granulocytes: 1 %
Lymphocytes Relative: 28 %
Lymphs Abs: 2.5 10*3/uL (ref 0.7–4.0)
MCH: 35.4 pg — ABNORMAL HIGH (ref 26.0–34.0)
MCHC: 32.1 g/dL (ref 30.0–36.0)
MCV: 110.3 fL — ABNORMAL HIGH (ref 80.0–100.0)
Monocytes Absolute: 1 10*3/uL (ref 0.1–1.0)
Monocytes Relative: 11 %
Neutro Abs: 5.3 10*3/uL (ref 1.7–7.7)
Neutrophils Relative %: 59 %
Platelets: 134 10*3/uL — ABNORMAL LOW (ref 150–400)
RBC: 1.75 MIL/uL — ABNORMAL LOW (ref 3.87–5.11)
RDW: 13.5 % (ref 11.5–15.5)
Smear Review: NORMAL
WBC: 9 10*3/uL (ref 4.0–10.5)
nRBC: 0 % (ref 0.0–0.2)

## 2020-12-21 LAB — BASIC METABOLIC PANEL
Anion gap: 7 (ref 5–15)
BUN: 21 mg/dL (ref 8–23)
CO2: 29 mmol/L (ref 22–32)
Calcium: 8.7 mg/dL — ABNORMAL LOW (ref 8.9–10.3)
Chloride: 103 mmol/L (ref 98–111)
Creatinine, Ser: 1.48 mg/dL — ABNORMAL HIGH (ref 0.44–1.00)
GFR, Estimated: 34 mL/min — ABNORMAL LOW (ref >60)
Glucose, Bld: 118 mg/dL — ABNORMAL HIGH (ref 70–99)
Potassium: 3.8 mmol/L (ref 3.5–5.1)
Sodium: 139 mmol/L (ref 135–145)

## 2020-12-21 LAB — TROPONIN I (HIGH SENSITIVITY): Troponin I (High Sensitivity): 5 ng/L (ref <18)

## 2020-12-21 LAB — VITAMIN B12: Vitamin B-12: 391 pg/mL (ref 180–914)

## 2020-12-21 LAB — FOLATE: Folate: 23 ng/mL (ref >5.9)

## 2020-12-21 LAB — IRON AND TIBC
Iron: 146 ug/dL (ref 28–170)
Saturation Ratios: 43 % — ABNORMAL HIGH (ref 10.4–31.8)
TIBC: 340 ug/dL (ref 250–450)
UIBC: 194 ug/dL

## 2020-12-21 LAB — MAGNESIUM: Magnesium: 2 mg/dL (ref 1.7–2.4)

## 2020-12-21 LAB — HEMOGLOBIN AND HEMATOCRIT, BLOOD
HCT: 29.9 % — ABNORMAL LOW (ref 36.0–46.0)
Hemoglobin: 10.3 g/dL — ABNORMAL LOW (ref 12.0–15.0)

## 2020-12-21 LAB — OCCULT BLOOD X 1 CARD TO LAB, STOOL: Fecal Occult Bld: POSITIVE — AB

## 2020-12-21 LAB — GLUCOSE, CAPILLARY: Glucose-Capillary: 136 mg/dL — ABNORMAL HIGH (ref 70–99)

## 2020-12-21 LAB — PREPARE RBC (CROSSMATCH)

## 2020-12-21 MED ORDER — SODIUM CHLORIDE 0.9% FLUSH
3.0000 mL | Freq: Two times a day (BID) | INTRAVENOUS | Status: DC
  Administered 2020-12-21: 3 mL via INTRAVENOUS

## 2020-12-21 MED ORDER — SODIUM CHLORIDE 0.9% FLUSH: 3.0000 mL | INTRAVENOUS | Status: DC | PRN

## 2020-12-21 MED ORDER — DONEPEZIL HCL 5 MG PO TABS: 5.0000 mg | ORAL_TABLET | Freq: Every day | ORAL | Status: DC

## 2020-12-21 MED ORDER — SODIUM CHLORIDE 0.9% FLUSH: 3.0000 mL | Freq: Two times a day (BID) | INTRAVENOUS | Status: DC

## 2020-12-21 MED ORDER — LISINOPRIL 5 MG PO TABS: 2.5000 mg | ORAL_TABLET | Freq: Every day | ORAL | Status: DC

## 2020-12-21 MED ORDER — ELIQUIS 2.5 MG PO TABS: ORAL_TABLET | ORAL | Status: DC

## 2020-12-21 MED ORDER — ACETAMINOPHEN 325 MG PO TABS
650.0000 mg | ORAL_TABLET | ORAL | Status: DC | PRN
  Administered 2020-12-21 (×2): 650 mg via ORAL
  Filled 2020-12-21 (×2): qty 2

## 2020-12-21 MED ORDER — AMIODARONE HCL 200 MG PO TABS
200.0000 mg | ORAL_TABLET | Freq: Every day | ORAL | Status: DC
  Administered 2020-12-21: 13:00:00 200 mg via ORAL
  Filled 2020-12-21: qty 1

## 2020-12-21 MED ORDER — SODIUM CHLORIDE 0.9 % IV SOLN: INTRAVENOUS | Status: DC

## 2020-12-21 MED ORDER — TORSEMIDE 20 MG PO TABS: 20.0000 mg | ORAL_TABLET | Freq: Every day | ORAL | 0 refills | Status: DC | PRN

## 2020-12-21 MED ORDER — PAROXETINE HCL 20 MG PO TABS: 20.0000 mg | ORAL_TABLET | Freq: Every day | ORAL | Status: DC

## 2020-12-21 MED ORDER — LEVOTHYROXINE SODIUM 50 MCG PO TABS: 150.0000 ug | ORAL_TABLET | Freq: Every day | ORAL | Status: DC

## 2020-12-21 MED ORDER — LEVOTHYROXINE SODIUM 112 MCG PO TABS
112.0000 ug | ORAL_TABLET | Freq: Every day | ORAL | Status: DC
  Filled 2020-12-21: qty 1

## 2020-12-21 MED ORDER — ROSUVASTATIN CALCIUM 10 MG PO TABS: 10.0000 mg | ORAL_TABLET | Freq: Every day | ORAL | Status: DC

## 2020-12-21 MED ORDER — FERROUS SULFATE 325 (65 FE) MG PO TABS: 325.0000 mg | ORAL_TABLET | Freq: Every day | ORAL | Status: DC

## 2020-12-21 MED ORDER — SODIUM CHLORIDE 0.9 % IV SOLN: 250.0000 mL | INTRAVENOUS | Status: DC | PRN

## 2020-12-21 MED ORDER — SPIRONOLACTONE 25 MG PO TABS: 25.0000 mg | ORAL_TABLET | Freq: Every day | ORAL | Status: DC

## 2020-12-21 NOTE — Evaluation (Signed)
Physical Therapy Evaluation Patient Details Name: [name] MRN: [id number] DOB: 10-May-1932 Today's Date: 12/21/2020   History of Present Illness  87 y.o. female seen in ed with complaints of fall on sidewalk. Pt has hematoma on  right forehead and skins tears to both arms, and rt hip pain and hematoma.   HPI as per patient stating that she was walking on the sidewalk when she missed stepped and fell forward striking her head.  She denies any bleeding or loss of consciousness any dizziness any chest pain palpitations tinnitus, any chest pain any weakness.  Clinical Impression  Pt supine in bed, agreeable to therapy. She was administered Tylenol prior to mobility. Orthostatics were assessed - please see vitals. Pt remained asymptomatic with slight drop in BP during sitting and standing. Bed mobility, STS and ambulation 257ft were performed with SUP using RW. Pt agreeable to using RW or Rollator upon d/c for improved stability. Pt was educated on orthostatics, safe use of RW/Rollator, d/c recs and swelling in BLE. General rec on hydration provided as pt reported BLE cramping and primarily drinking tea and soda. Due to impaired balance and recent frequent falls, PT rec HHPT at d/c and intermittent supervision by son and neighbors as pt needs. Would benefit from skilled PT to address above deficits and promote optimal return to PLOF.      Follow Up Recommendations Home health PT;Supervision - Intermittent    Equipment Recommendations  None recommended by PT    Recommendations for Other Services       Precautions / Restrictions Precautions Precautions: Fall Restrictions Weight Bearing Restrictions: No      Mobility  Bed Mobility Overal bed mobility: Needs Assistance Bed Mobility: Supine to Sit;Sit to Supine     Supine to sit: Supervision Sit to supine: Supervision   General bed mobility comments: increased time to complete    Transfers Overall transfer level: Needs  assistance Equipment used: Rolling walker (2 wheeled) Transfers: Sit to/from Stand Sit to Stand: Supervision Stand pivot transfers: Min guard       General transfer comment: using BUE  Ambulation/Gait Ambulation/Gait assistance: Supervision Gait Distance (Feet): 200 Feet Assistive device: Rolling walker (2 wheeled) Gait Pattern/deviations: WFL(Within Functional Limits);Step-through pattern Gait velocity: decreased   General Gait Details: good navigation around obstacles in room; muscular fatigue upon completion  Stairs            Wheelchair Mobility    Modified Rankin (Stroke Patients Only)       Balance Overall balance assessment: Needs assistance Sitting-balance support: Feet supported Sitting balance-[name]: Good     Standing balance support: During functional activity;No upper extremity supported Standing balance-[name]: Fair Standing balance comment: stood for 5 minutes during orthostatics, no UE support, no LOB.                             Pertinent Vitals/Pain Pain Assessment: Faces Pain Score: 5  Faces Pain Scale: Hurts little more Pain Location: R hip Pain Descriptors / Indicators: Aching;Discomfort Pain Intervention(s): Limited activity within patient's tolerance;Monitored during session;RN gave pain meds during session;Repositioned    Home Living Family/patient expects to be discharged to:: Other (Comment) Living Arrangements: Alone Available Help at Discharge: Family;Friend(s);Available PRN/intermittently Type of Home: Independent living facility Home Access: Level entry     Home Layout: One level Home Equipment: Walker - 2 wheels;Bedside commode;Shower seat;Other (comment);Walker - 4 wheels (rollator)      Prior Function Level  of Independence: Independent with assistive device(s)         Comments: Pt reports living in ILF in an apartment by herself. She uses SPC for ambulation. She still drives. She does go to  dining room for lunch but does all other meals, meds, and ADL tasks independently in her apartment. Reports 2 falls in the past 6 months, both within the last 2 weeks. The first fall due to lightheadedness upon initial stand. The second fall due to tripping outside on the sidewalk.     Hand Dominance   Dominant Hand: Right    Extremity/Trunk Assessment   Upper Extremity Assessment Upper Extremity Assessment: Overall WFL for tasks assessed    Lower Extremity Assessment Lower Extremity Assessment: Overall WFL for tasks assessed (4+/5 R knee extension; 5/5 otherwise)    Cervical / Trunk Assessment Cervical / Trunk Assessment: Kyphotic  Communication   Communication: HOH  Cognition Arousal/Alertness: Awake/alert Behavior During Therapy: WFL for tasks assessed/performed Overall Cognitive Status: Within Functional Limits for tasks assessed                                 General Comments: very pleasant and cooperative      General Comments      Exercises     Assessment/Plan    PT Assessment Patient needs continued PT services  PT Problem List Decreased strength;Decreased activity tolerance;Decreased skin integrity;Decreased balance;Decreased mobility       PT Treatment Interventions DME instruction;Balance training;Gait training;Neuromuscular re-education;Functional mobility training;Therapeutic activities;Therapeutic exercise;Patient/family education    PT Goals (Current goals can be found in the Care Plan section)  Acute Rehab PT Goals Patient Stated Goal: to go home PT Goal Formulation: With patient Time For Goal Achievement: 01/04/21 Potential to Achieve Goals: Good    Frequency Min 2X/week   Barriers to discharge        Co-evaluation               AM-PAC PT "6 Clicks" Mobility  Outcome Measure Help needed turning from your back to your side while in a flat bed without using bedrails?: None Help needed moving from lying on your back to  sitting on the side of a flat bed without using bedrails?: A Little Help needed moving to and from a bed to a chair (including a wheelchair)?: A Little Help needed standing up from a chair using your arms (e.g., wheelchair or bedside chair)?: A Little Help needed to walk in hospital room?: A Little Help needed climbing 3-5 steps with a railing? : A Little 6 Click Score: 19    End of Session Equipment Utilized During Treatment: Gait belt Activity Tolerance: Patient tolerated treatment well Patient left: in chair;with call bell/phone within reach Nurse Communication: Mobility status;Patient requests pain meds PT Visit Diagnosis: Unsteadiness on feet (R26.81);Muscle weakness (generalized) (M62.81);Repeated falls (R29.6);History of falling (Z91.81)    Time: 1610-9604 PT Time Calculation (min) (ACUTE ONLY): 41 min   Charges:   PT Evaluation $PT Eval Moderate Complexity: 1 Mod PT Treatments $Therapeutic Activity: 23-37 mins       Patrina Levering PT, DPT 12/21/20 1:19 PM [phone]   Ramonita Lab 12/21/2020, 1:11 PM

## 2020-12-21 NOTE — TOC Progression Note (Addendum)
Transition of Care [facility]) - Progression Note    Patient Details  Name: [name] MRN: [id number] Date of Birth: 1932/08/21  Transition of Care [facility]) CM/SW [place], RN Phone Number: 12/21/2020, 1:07 PM  Clinical Narrative:  Patient is from [facility] in [place], independent living.  She states that she and her son would like her to return there.     Message left for patient's son and for [facility] re: discharge, transportation and home health.  Awaiting return call.  TOC contact information given, TOC to follow to discharge.  Addendum:  Son returned call, he will pick patient up to transport to facility between 5-530pm today.  Patient has a walker at home, son will advocate use of the walker.  HHPT and OT with [facility].    Addendum:  HH PT OT and nursing will be with [facility], confirmed by [facility]   Expected Discharge Plan and Services                                                 Social Determinants of Health (SDOH) Interventions    Readmission Risk Interventions No flowsheet data found.

## 2020-12-21 NOTE — TOC Progression Note (Signed)
Transition of Care [facility]) - Progression Note    Patient Details  Name: [name] MRN: [id number] Date of Birth: 10/20/1932  [facility]) CM/SW [place], RN Phone Number: 12/21/2020, 2:21 PM  Clinical Narrative:   Patient and son declined to sign Code 22, but advised of the specifics of the aforementioned.         Expected Discharge Plan and Services           Expected Discharge Date: 12/21/20                                     Social Determinants of Health (SDOH) Interventions    Readmission Risk Interventions Readmission Risk Prevention Plan 12/21/2020  Transportation Screening Complete  PCP or Specialist Appt within 3-5 Days Complete  HRI or [place] Complete  Social Work Consult for [place] Planning/Counseling Not Complete  SW consult not completed comments Patient is assigned to RN CM  Palliative Care Screening Not Applicable  Medication Review Press photographer) Complete  Some recent data might be hidden

## 2020-12-21 NOTE — Care Management CC44 (Signed)
Condition Code 44 Documentation Completed  Patient Details  Name: [name] MRN: [id number] Date of Birth: Sep 29, 1932   Condition Code 44 given:  Yes Patient signature on Condition Code 44 notice:  Yes patient and son refused to sign Code 44 notice. Documentation of 2 MD's agreement:  Yes Code 44 added to claim:  Yes    Pete Pelt, RN 12/21/2020, 1:52 PM

## 2020-12-21 NOTE — Evaluation (Signed)
Occupational Therapy Evaluation Patient Details Name: [name] MRN: [id number] DOB: 12-09-1932 Today's Date: 12/21/2020    History of Present Illness 87 y.o. female seen in ed with complaints of fall on sidewalk. Pt has hematoma on  right forehead and skins tears to both arms, and rt hip pain and hematoma.   HPI as per patient stating that she was walking on the sidewalk when she missed stepped and fell forward striking her head.  She denies any bleeding or loss of consciousness any dizziness any chest pain palpitations tinnitus, any chest pain any weakness.   Clinical Impression   Patient presenting with decreased I in self care,endurance, safety awareness, functional mobility and transfers, Patient reports living in ILF at baseline. She uses SPC for mobility, drives, and is independent in ADLs PTA. She does go to a dining room for one meal each day and prepare all other meals in her apartment. Patient currently functioning at supervision - min guard for functional mobility with use of cane in room. Pt performing bathing tasks seated EOB with sit <>stand. Pt also ambulating to bathroom with min guard and able to perform hygiene and clothing management with min guard.Patient will benefit from acute OT to increase overall independence in the areas of ADLs, functional mobility,and safety awareness  in order to safely discharge home.     Follow Up Recommendations  Home health OT;Supervision - Intermittent    Equipment Recommendations  None recommended by OT;Other (comment) (recommend pt utilize RW at home for mobility to increase safety. She has all needed items.)       Precautions / Restrictions Precautions Precautions: Fall      Mobility Bed Mobility Overal bed mobility: Needs Assistance Bed Mobility: Supine to Sit;Sit to Supine     Supine to sit: Supervision Sit to supine: Supervision   General bed mobility comments: no physical assistance but pt requires increased time to complete  task    Transfers Overall transfer level: Needs assistance Equipment used: Straight cane Transfers: Sit to/from Stand;Stand Pivot Transfers Sit to Stand: Supervision Stand pivot transfers: Min guard            Balance Overall balance assessment: Needs assistance Sitting-balance support: Feet supported Sitting balance-[name]: Good     Standing balance support: During functional activity Standing balance-[name]: Fair                             ADL either performed or assessed with clinical judgement   ADL Overall ADL's : Needs assistance/impaired     Grooming: Wash/dry hands;Wash/dry face;Brushing hair;Oral care;Supervision/safety;Standing Grooming Details (indicate cue type and reason): close supervision standing at sink Upper Body Bathing: Set up;Supervision/ safety;Sitting   Lower Body Bathing: Min guard   Upper Body Dressing : Set up Upper Body Dressing Details (indicate cue type and reason): hospital gown Lower Body Dressing: Supervision/safety   Toilet Transfer: Min guard;Ambulation Toilet Transfer Details (indicate cue type and reason): with [facility] Toileting- Clothing Manipulation and Hygiene: Min guard;Sit to/from stand       Functional mobility during ADLs: Min guard;Supervision/safety;Cane       Vision Baseline Vision/History: 1 Wears glasses Patient Visual Report: No change from baseline       Perception     Praxis      Pertinent Vitals/Pain Pain Assessment: 0-10 Pain Score: 5  Pain Location: R hip Pain Descriptors / Indicators: Aching;Discomfort Pain Intervention(s): Limited activity within patient's tolerance;Repositioned;Premedicated before session;Monitored during  session     Hand Dominance Right   Extremity/Trunk Assessment Upper Extremity Assessment Upper Extremity Assessment: Overall WFL for tasks assessed;Generalized weakness   Lower Extremity Assessment Lower Extremity Assessment: Overall WFL for tasks  assessed;Generalized weakness       Communication Communication Communication: HOH   Cognition Arousal/Alertness: Awake/alert Behavior During Therapy: WFL for tasks assessed/performed Overall Cognitive Status: Within Functional Limits for tasks assessed                                 General Comments: very pleasant and cooperative   General Comments       Exercises     Shoulder Instructions      Home Living Family/patient expects to be discharged to:: Other (Comment) Living Arrangements: Alone Available Help at Discharge: Family;Friend(s);Available PRN/intermittently Type of Home: Independent living facility Home Access: Stairs to enter;Level entry     Home Layout: One level     Bathroom Shower/Tub: Walk-in shower         Home Equipment: Environmental consultant - 2 wheels;Bedside commode;Shower seat;Other (comment) (rollator)          Prior Functioning/Environment Level of Independence: Independent with assistive device(s)        Comments: Pt reports living in ILF in an apartment by herself. She uses SPC for ambulation. She still drives. She does go to dining room for lunch but does all other meals, meds, and ADL tasks independently in her apartment.        OT Problem List: Decreased strength;Decreased knowledge of use of DME or AE;Decreased activity tolerance;Impaired balance (sitting and/or standing);Decreased safety awareness      OT Treatment/Interventions: Self-care/ADL training;Manual therapy;Therapeutic exercise;Patient/family education;Balance training;Energy conservation;Therapeutic activities;DME and/or AE instruction    OT Goals(Current goals can be found in the care plan section) Acute Rehab OT Goals Patient Stated Goal: to go home OT Goal Formulation: With patient Time For Goal Achievement: 01/04/21 Potential to Achieve Goals: Good  OT Frequency: Min 2X/week   Barriers to D/C:    none known       Co-evaluation              AM-PAC  OT "6 Clicks" Daily Activity     Outcome Measure Help from another person eating meals?: None Help from another person taking care of personal grooming?: None Help from another person toileting, which includes using toliet, bedpan, or urinal?: A Little Help from another person bathing (including washing, rinsing, drying)?: A Little Help from another person to put on and taking off regular upper body clothing?: None Help from another person to put on and taking off regular lower body clothing?: A Little 6 Click Score: 21   End of Session Equipment Utilized During Treatment: Other (comment) (cane) Nurse Communication: Mobility status;Precautions  Activity Tolerance: Patient tolerated treatment well Patient left: in bed;with call bell/phone within reach;with bed alarm set  OT Visit Diagnosis: Unsteadiness on feet (R26.81);History of falling (Z91.81);Repeated falls (R29.6)                Time: 1017-1100 OT Time Calculation (min): 43 min Charges:  OT General Charges $OT Visit: 1 Visit OT Evaluation $OT Eval Moderate Complexity: 1 Mod OT Treatments $Self Care/Home Management : 38-52 mins  Darleen Crocker, MS, OTR/L , CBIS ascom [phone]  12/21/20, 1:09 PM

## 2020-12-21 NOTE — ED Notes (Addendum)
Took over from previous shift nurse, pt. Is resting comfortably in bed.  Pt. Has multiple skin tears to left forearm, and large hematomas to multiple areas of her body. Pt. Has bruising to face from her fall today. Right leg/hip have large hematoma from fall last week per pt.

## 2020-12-21 NOTE — Progress Notes (Signed)
Discharge instructions reviewed with patient. No questions or concerns at this time

## 2020-12-21 NOTE — Discharge Summary (Signed)
Physician Discharge Summary   [name]  female DOB: 1932/06/07  FUX:[id number]  PCP: Rusty Aus, MD  Admit date: 12/20/2020 Discharge date: 12/21/2020  Admitted From: home Disposition:  home Home Health: Yes CODE STATUS: Full code  Discharge Instructions     Discharge instructions   Complete by: As directed    Because of your blood thinner Eliquis, you had a lot of bruising and hematomas after you fell.  Since you have had frequent falls (including hitting your head this time), the benefit of blood thinner to prevent stroke does not seem to outweigh the risk of further bleeding from more falls.  Your Eliquis is held pending discussion with your cardiologist whether to discontinue blood thinner all together.   Dr. Enzo Bi - -   No wound care   Complete by: As directed         Hospital Course:  For full details, please see H&P, progress notes, consult notes and ancillary notes.  Briefly,  [name] is a 87 y.o. female with hx of heart failure, anemia, chronic kidney disease, COPD, depression, hypertension, PAD, paroxysmal atrial fibrillation on Eliquis, history of DVT, history of breast cancer, hypothyroidism who presented after a fall on sidewalk.  She denied loss of consciousness or dizziness.  Pt has hematoma on right forehead and skins tears to both arms, and right hip pain and hematoma.   Right hip hematoma Acute blood loss anemia --Hgb 6.2 on presentation, which appeared to be an error, since Hgb went up to 10.3 just after 1u pRBC.  Hgb 11.3 prior to discharge.   PAF on Eliquis --pt reported frequent falls, including hitting her head this last episode.  Eliquis held pending discussion with outpatient cardiologist, since the benefit of anticoagulation does not appear to outweigh the risk of likely future falls leading to more bleedings.   --Amiodarone and metop continued.   Hypothyroidism: Cont levothyroxine.    Discharge Diagnoses:  Principal  Problem:   Syncope and collapse Active Problems:   PAF (paroxysmal atrial fibrillation) (HCC)   Hypertension   Renal insufficiency   Chronic diastolic congestive heart failure (HCC)   Orthostatic hypotension   GERD without esophagitis   Anemia   Hypothyroidism due to acquired atrophy of thyroid   30 Day Unplanned Readmission Risk Score    Flowsheet Row ED to Hosp-Admission (Current) from 12/20/2020 in [facility] (1C)  30 Day Unplanned Readmission Risk Score (%) 24.58 Filed at 12/21/2020 1200       This score is the patient's risk of an unplanned readmission within 30 days of being discharged (0 -100%). The score is based on dignosis, age, lab data, medications, orders, and past utilization.   Low:  0-14.9   Medium: 15-21.9   High: 22-29.9   Extreme: 30 and above         Discharge Instructions:  Allergies as of 12/21/2020       Reactions   Other Other (See Comments)   NO BP, VENIPUNCTURE, OR ACCESS IN RIGHT ARM - S/P MASTECTOMY ON RIGHT   Amlodipine Nausea And Vomiting   Ciprofloxacin Nausea Only   Doxycycline Itching, Other (See Comments)   shaky   Lipitor [atorvastatin] Other (See Comments)   Joint pain   Morphine And Related Nausea And Vomiting   Macrobid [nitrofurantoin Macrocrystal] Rash   Penicillins Rash, Other (See Comments)   Has patient had a PCN reaction causing immediate rash, facial/tongue/throat swelling, SOB or lightheadedness with hypotension:  yes Has patient had a PCN reaction causing severe rash involving mucus membranes or skin necrosis: no Has patient had a PCN reaction that required hospitalization no Has patient had a PCN reaction occurring within the last 10 years: no If all of the above answers are "NO", then may proceed with Cephalosporin use.   Sulfa Antibiotics Rash        Medication List     STOP taking these medications    dicyclomine 10 MG capsule Commonly known as: Bentyl   ferrous sulfate 325 (65  FE) MG tablet   lisinopril 10 MG tablet Commonly known as: ZESTRIL   pantoprazole 40 MG tablet Commonly known as: PROTONIX   rosuvastatin 10 MG tablet Commonly known as: CRESTOR       TAKE these medications    acetaminophen 500 MG tablet Commonly known as: TYLENOL Take 1,000 mg by mouth at bedtime as needed for moderate pain or headache.   acetaminophen 325 MG tablet Commonly known as: TYLENOL Take 650 mg by mouth every 4 (four) hours as needed (pain).   ALLEVYN GENTLE BORDER EX Apply 1 patch topically every 3 (three) days. Apply to left lower extremity every 3 days cleanse with saline; apply skin prep to surrounding skin and apply patch every 3 days.   amiodarone 200 MG tablet Commonly known as: PACERONE Take 200 mg by mouth 2 (two) times daily.   aspirin EC 81 MG tablet Take 81 mg by mouth daily.   budesonide 3 MG 24 hr capsule Commonly known as: ENTOCORT EC Take 9 mg by mouth every morning.   calcium-vitamin D 500-200 MG-UNIT tablet Commonly known as: OSCAL WITH D Take 1 tablet by mouth daily with breakfast.   CENTROVITE Tabs Take 1 tablet by mouth daily.   docusate sodium 100 MG capsule Commonly known as: COLACE Take 100 mg by mouth daily as needed for mild constipation.   donepezil 5 MG tablet Commonly known as: ARICEPT Take 5 mg by mouth at bedtime.   Eliquis 2.5 MG Tabs tablet Generic drug: apixaban Hold this medication due to recent falls resulting in hematomas.  Consider discontinuing after discussing with your cardiologist. What changed:  how much to take how to take this when to take this additional instructions Another medication with the same name was removed. Continue taking this medication, and follow the directions you see here.   famotidine 40 MG tablet Commonly known as: PEPCID Take 40 mg by mouth at bedtime.   guaifenesin 100 MG/5ML syrup Commonly known as: ROBITUSSIN Take 200 mg by mouth 3 (three) times daily as needed for  cough.   HYDROcodone-acetaminophen 5-325 MG tablet Commonly known as: NORCO/VICODIN Take 0.5-1 tablets by mouth 2 (two) times daily as needed for pain.   hyoscyamine 0.125 MG tablet Commonly known as: LEVSIN Take 0.125 mg by mouth every 6 (six) hours as needed for cramping or diarrhea or loose stools.   lactose free nutrition Liqd Take 237 mLs by mouth daily.   levothyroxine 112 MCG tablet Commonly known as: SYNTHROID Take 112 mcg by mouth daily before breakfast.   metoprolol succinate 50 MG 24 hr tablet Commonly known as: TOPROL-XL Take 25-50 mg by mouth 2 (two) times daily.   NON FORMULARY Diet type: NAS, Regular consistency, thin liquids ok   PARoxetine 20 MG tablet Commonly known as: PAXIL Take 1 tablet (20 mg total) by mouth daily.   potassium chloride 10 MEQ tablet Commonly known as: KLOR-CON Take 1 tablet (10 mEq total) by mouth  2 (two) times daily.   spironolactone 25 MG tablet Commonly known as: ALDACTONE Take 1 tablet (25 mg total) by mouth daily.   torsemide 20 MG tablet Commonly known as: DEMADEX Take 1 tablet (20 mg total) by mouth daily as needed. What changed:  when to take this reasons to take this   Vitamin D3 125 MCG (5000 UT) Caps Take 5,000 Units by mouth daily.         Follow-up Information     [facility] EMERGENCY DEPARTMENT .   Specialty: Emergency Medicine Why: If symptoms worsen Contact information: [place] [phone] [address] [place]        Rusty Aus, MD. Go in 1 week.   Specialty: Internal Medicine Why: For suture removal Contact information: [place] [address] [phone]         Wellington Hampshire, MD .   Specialty: Cardiology Contact information: [address] [place] [address] [phone]                 Allergies  Allergen Reactions   Other  Other (See Comments)    NO BP, VENIPUNCTURE, OR ACCESS IN RIGHT ARM - S/P MASTECTOMY ON RIGHT   Amlodipine Nausea And Vomiting   Ciprofloxacin Nausea Only   Doxycycline Itching and Other (See Comments)    shaky   Lipitor [Atorvastatin] Other (See Comments)    Joint pain    Morphine And Related Nausea And Vomiting   Macrobid [Nitrofurantoin Macrocrystal] Rash   Penicillins Rash and Other (See Comments)    Has patient had a PCN reaction causing immediate rash, facial/tongue/throat swelling, SOB or lightheadedness with hypotension: yes Has patient had a PCN reaction causing severe rash involving mucus membranes or skin necrosis: no Has patient had a PCN reaction that required hospitalization no Has patient had a PCN reaction occurring within the last 10 years: no If all of the above answers are "NO", then may proceed with Cephalosporin use.    Sulfa Antibiotics Rash     The results of significant diagnostics from this hospitalization (including imaging, microbiology, ancillary and laboratory) are listed below for reference.   Consultations:   Procedures/Studies: CT Head Wo Contrast  Result Date: 12/20/2020 CLINICAL DATA:  Head trauma, minor (Age >= 65y) Laceration to head. EXAM: CT HEAD WITHOUT CONTRAST TECHNIQUE: Contiguous axial images were obtained from the base of the skull through the vertex without intravenous contrast. COMPARISON:  Head CT 10/26/2019 FINDINGS: Brain: Age related atrophy with mild chronic small vessel ischemia. No intracranial hemorrhage, mass effect, or midline shift. No hydrocephalus. The basilar cisterns are patent. Remote lacunar infarct in the right basal ganglia. No evidence of territorial infarct or acute ischemia. No extra-axial or intracranial fluid collection. Vascular: Atherosclerosis of skullbase vasculature without hyperdense vessel or abnormal calcification. Skull: No fracture or focal lesion. Sinuses/Orbits: No fracture or acute findings. Included  paranasal sinuses are clear. Postsurgical change of the right zygomatic temporal buttress. Bilateral cataract resection. Other: Right frontal scalp hematoma. IMPRESSION: 1. Right frontal scalp hematoma. No acute intracranial abnormality. No skull fracture. 2. Age related atrophy and chronic small vessel ischemia. Remote lacunar infarct in the right basal ganglia. Electronically Signed   By: Keith Rake M.D.   On: 12/20/2020 20:15   CT Cervical Spine Wo Contrast  Result Date: 12/20/2020 CLINICAL DATA:  Neck trauma (Age >= 65y) Fall striking head. EXAM: CT CERVICAL SPINE WITHOUT  CONTRAST TECHNIQUE: Multidetector CT imaging of the cervical spine was performed without intravenous contrast. Multiplanar CT image reconstructions were also generated. COMPARISON:  Cervical spine CT 01/12/2016 FINDINGS: Alignment: Stable degenerative type grade 1 anterolisthesis of C3 on C4 and C4 on C5. Mild broad-based leftward curvature may be positioning and was not seen on prior exam. No traumatic subluxation. Skull base and vertebrae: No acute fracture. Vertebral body heights are maintained. The dens and skull base are intact. Soft tissues and spinal canal: No prevertebral fluid or swelling. No visible canal hematoma. Disc levels: Degenerative disc disease, most prominent at C6-C7. There is prominent multifocal facet hypertrophy. Mild dorsal calcifications in the spinal canal at the level of C3-C4, stable from prior. No severe spinal canal narrowing. Upper chest: No acute or unexpected findings. 5 mm right apical pulmonary nodule stable from 2017 and considered benign, no further follow-up is needed. Other: Mild carotid calcifications. IMPRESSION: 1. No acute fracture or subluxation of the cervical spine. 2. Multilevel degenerative disc disease and facet hypertrophy. Electronically Signed   By: Keith Rake M.D.   On: 12/20/2020 20:21      Labs: BNP (last 3 results) No results for input(s): BNP in the last 8760  hours. Basic Metabolic Panel: Recent Labs  Lab 12/20/20 2217  NA 140  K 3.4*  CL 105  CO2 24  GLUCOSE 211*  BUN 24*  CREATININE 1.66*  CALCIUM 8.7*   Liver Function Tests: Recent Labs  Lab 12/20/20 2217  AST 26  ALT 18  ALKPHOS 41  BILITOT 0.6  PROT 5.6*  ALBUMIN 3.2*   Recent Labs  Lab 12/20/20 2217  LIPASE 39   No results for input(s): AMMONIA in the last 168 hours. CBC: Recent Labs  Lab 12/20/20 2217 12/21/20 0824 12/21/20 1240  WBC 9.0  --  10.7*  NEUTROABS 5.3  --   --   HGB 6.2* 10.3* 11.3*  HCT 19.3* 29.9* 33.5*  MCV 110.3*  --  99.7  PLT 134*  --  188   Cardiac Enzymes: No results for input(s): CKTOTAL, CKMB, CKMBINDEX, TROPONINI in the last 168 hours. BNP: Invalid input(s): POCBNP CBG: Recent Labs  Lab 12/21/20 0816  GLUCAP 136*   D-Dimer No results for input(s): DDIMER in the last 72 hours. Hgb A1c No results for input(s): HGBA1C in the last 72 hours. Lipid Profile No results for input(s): CHOL, HDL, LDLCALC, TRIG, CHOLHDL, LDLDIRECT in the last 72 hours. Thyroid function studies No results for input(s): TSH, T4TOTAL, T3FREE, THYROIDAB in the last 72 hours.  Invalid input(s): FREET3 Anemia work up No results for input(s): VITAMINB12, FOLATE, FERRITIN, TIBC, IRON, RETICCTPCT in the last 72 hours. Urinalysis    Component Value Date/Time   COLORURINE YELLOW (A) 08/31/2020 1425   APPEARANCEUR CLEAR (A) 08/31/2020 1425   APPEARANCEUR Clear 08/29/2013 1027   LABSPEC 1.017 08/31/2020 1425   LABSPEC 1.005 08/29/2013 1027   PHURINE 5.0 08/31/2020 1425   GLUCOSEU NEGATIVE 08/31/2020 1425   GLUCOSEU Negative 08/29/2013 1027   HGBUR NEGATIVE 08/31/2020 [place] 08/31/2020 1425   BILIRUBINUR Negative 08/29/2013 [place] 08/31/2020 1425   PROTEINUR NEGATIVE 08/31/2020 1425   NITRITE NEGATIVE 08/31/2020 1425   LEUKOCYTESUR SMALL (A) 08/31/2020 1425   LEUKOCYTESUR Negative 08/29/2013 1027   Sepsis  Labs Invalid input(s): PROCALCITONIN,  WBC,  LACTICIDVEN Microbiology Recent Results (from the past 240 hour(s))  Resp Panel by RT-PCR (Flu A&B, Covid) Nasopharyngeal Swab     Status: None  Collection Time: 12/20/20 10:17 PM   Specimen: Nasopharyngeal Swab; Nasopharyngeal(NP) swabs in vial transport medium  Result Value Ref Range Status   SARS Coronavirus 2 by RT PCR NEGATIVE NEGATIVE Final    Comment: (NOTE) SARS-CoV-2 target nucleic acids are NOT DETECTED.  The SARS-CoV-2 RNA is generally detectable in upper respiratory specimens during the acute phase of infection. The lowest concentration of SARS-CoV-2 viral copies this assay can detect is 138 copies/mL. A negative result does not preclude SARS-Cov-2 infection and should not be used as the sole basis for treatment or other patient management decisions. A negative result may occur with  improper specimen collection/handling, submission of specimen other than nasopharyngeal swab, presence of viral mutation(s) within the areas targeted by this assay, and inadequate number of viral copies(<138 copies/mL). A negative result must be combined with clinical observations, patient history, and epidemiological information. The expected result is Negative.  Fact Sheet for Patients:  EntrepreneurPulse.com.au  Fact Sheet for Healthcare Providers:  IncredibleEmployment.be  This test is no t yet approved or cleared by the [place] FDA and  has been authorized for detection and/or diagnosis of SARS-CoV-2 by FDA under an Emergency Use Authorization (EUA). This EUA will remain  in effect (meaning this test can be used) for the duration of the COVID-19 declaration under Section 564(b)(1) of the Act, 21 U.S.C.section 360bbb-3(b)(1), unless the authorization is terminated  or revoked sooner.       Influenza A by PCR NEGATIVE NEGATIVE Final   Influenza B by PCR NEGATIVE NEGATIVE Final    Comment:  (NOTE) The Xpert Xpress SARS-CoV-2/FLU/RSV plus assay is intended as an aid in the diagnosis of influenza from Nasopharyngeal swab specimens and should not be used as a sole basis for treatment. Nasal washings and aspirates are unacceptable for Xpert Xpress SARS-CoV-2/FLU/RSV testing.  Fact Sheet for Patients: EntrepreneurPulse.com.au  Fact Sheet for Healthcare Providers: IncredibleEmployment.be  This test is not yet approved or cleared by the [place] FDA and has been authorized for detection and/or diagnosis of SARS-CoV-2 by FDA under an Emergency Use Authorization (EUA). This EUA will remain in effect (meaning this test can be used) for the duration of the COVID-19 declaration under Section 564(b)(1) of the Act, 21 U.S.C. section 360bbb-3(b)(1), unless the authorization is terminated or revoked.  Performed at [facility], [address] [place], [place] [address]   Gastrointestinal Panel by PCR , Stool     Status: None   Collection Time: 12/20/20 10:17 PM   Specimen: Stool  Result Value Ref Range Status   Campylobacter species NOT DETECTED NOT DETECTED Final   Plesimonas shigelloides NOT DETECTED NOT DETECTED Final   Salmonella species NOT DETECTED NOT DETECTED Final   Yersinia enterocolitica NOT DETECTED NOT DETECTED Final   Vibrio species NOT DETECTED NOT DETECTED Final   Vibrio cholerae NOT DETECTED NOT DETECTED Final   Enteroaggregative E coli (EAEC) NOT DETECTED NOT DETECTED Final   Enteropathogenic E coli (EPEC) NOT DETECTED NOT DETECTED Final   Enterotoxigenic E coli (ETEC) NOT DETECTED NOT DETECTED Final   Shiga like toxin producing E coli (STEC) NOT DETECTED NOT DETECTED Final   Shigella/Enteroinvasive E coli (EIEC) NOT DETECTED NOT DETECTED Final   Cryptosporidium NOT DETECTED NOT DETECTED Final   Cyclospora cayetanensis NOT DETECTED NOT DETECTED Final   Entamoeba histolytica NOT DETECTED NOT DETECTED Final    Giardia lamblia NOT DETECTED NOT DETECTED Final   Adenovirus F40/41 NOT DETECTED NOT DETECTED Final   Astrovirus NOT DETECTED NOT DETECTED  Final   Norovirus GI/GII NOT DETECTED NOT DETECTED Final   Rotavirus A NOT DETECTED NOT DETECTED Final   Sapovirus (I, II, IV, and V) NOT DETECTED NOT DETECTED Final    Comment: Performed at [facility], [place], [place] [address]  C Difficile Quick Screen w PCR reflex     Status: None   Collection Time: 12/20/20 10:17 PM   Specimen: Stool  Result Value Ref Range Status   C Diff antigen NEGATIVE NEGATIVE Final   C Diff toxin NEGATIVE NEGATIVE Final   C Diff interpretation No C. difficile detected.  Final    Comment: Performed at [facility], [address] [place], [place] [address]     Total time spend on discharging this patient, including the last patient exam, discussing the hospital stay, instructions for ongoing care as it relates to all pertinent caregivers, as well as preparing the medical discharge records, prescriptions, and/or referrals as applicable, is 60 minutes.    Enzo Bi, MD  [facility] 12/21/2020, 1:29 PM

## 2020-12-22 LAB — TYPE AND SCREEN
ABO/RH(D): A POS
Antibody Screen: NEGATIVE
Unit division: 0

## 2020-12-22 LAB — BPAM RBC
Blood Product Expiration Date: 202210052359
ISSUE DATE / TIME: 202209060027
Unit Type and Rh: 6200

## 2020-12-24 ENCOUNTER — Other Ambulatory Visit: Payer: Self-pay

## 2020-12-24 ENCOUNTER — Emergency Department
Admission: EM | Admit: 2020-12-24 | Discharge: 2020-12-24 | Disposition: A | Discharge status: Home / Self Care | Payer: Medicare HMO | Attending: Emergency Medicine | Admitting: Emergency Medicine

## 2020-12-24 ENCOUNTER — Encounter: Payer: Self-pay | Admitting: Emergency Medicine

## 2020-12-24 DIAGNOSIS — S8001XA Contusion of right knee, initial encounter: Secondary | ICD-10-CM | POA: Insufficient documentation

## 2020-12-24 DIAGNOSIS — Z5321 Procedure and treatment not carried out due to patient leaving prior to being seen by health care provider: Secondary | ICD-10-CM | POA: Diagnosis not present

## 2020-12-24 DIAGNOSIS — S0083XA Contusion of other part of head, initial encounter: Secondary | ICD-10-CM | POA: Diagnosis present

## 2020-12-24 DIAGNOSIS — W010XXA Fall on same level from slipping, tripping and stumbling without subsequent striking against object, initial encounter: Secondary | ICD-10-CM | POA: Diagnosis not present

## 2020-12-24 LAB — COMPREHENSIVE METABOLIC PANEL
ALT: 18 U/L (ref 0–44)
AST: 26 U/L (ref 15–41)
Albumin: 3.5 g/dL (ref 3.5–5.0)
Alkaline Phosphatase: 44 U/L (ref 38–126)
Anion gap: 10 (ref 5–15)
BUN: 23 mg/dL (ref 8–23)
CO2: 25 mmol/L (ref 22–32)
Calcium: 8.9 mg/dL (ref 8.9–10.3)
Chloride: 104 mmol/L (ref 98–111)
Creatinine, Ser: 1.48 mg/dL — ABNORMAL HIGH (ref 0.44–1.00)
GFR, Estimated: 34 mL/min — ABNORMAL LOW (ref >60)
Glucose, Bld: 106 mg/dL — ABNORMAL HIGH (ref 70–99)
Potassium: 4.2 mmol/L (ref 3.5–5.1)
Sodium: 139 mmol/L (ref 135–145)
Total Bilirubin: 1.1 mg/dL (ref 0.3–1.2)
Total Protein: 6.1 g/dL — ABNORMAL LOW (ref 6.5–8.1)

## 2020-12-24 LAB — CBC
HCT: 28.4 % — ABNORMAL LOW (ref 36.0–46.0)
Hemoglobin: 9.5 g/dL — ABNORMAL LOW (ref 12.0–15.0)
MCH: 34.7 pg — ABNORMAL HIGH (ref 26.0–34.0)
MCHC: 33.5 g/dL (ref 30.0–36.0)
MCV: 103.6 fL — ABNORMAL HIGH (ref 80.0–100.0)
Platelets: 193 10*3/uL (ref 150–400)
RBC: 2.74 MIL/uL — ABNORMAL LOW (ref 3.87–5.11)
RDW: 15.2 % (ref 11.5–15.5)
WBC: 8.2 10*3/uL (ref 4.0–10.5)
nRBC: 0.2 % (ref 0.0–0.2)

## 2020-12-24 NOTE — ED Triage Notes (Signed)
Pt presents to ER from home reports she fell on Monday, tripped and fell on uneven pavement, hit right side of body, significant bruising noted to right side of face reports she was seen in ER after fall. Pt reports today her right leg bruising has increased and swelling to lower part of her leg. Pt does take blood thinners. Pt denies any pain to lower leg reports pain to right hip area since Monday. Pt concerned about swelling to right knee and bruising to area. Pt talks in complete sentences no distress noted

## 2020-12-28 ENCOUNTER — Other Ambulatory Visit: Payer: Self-pay | Admitting: Family Medicine

## 2020-12-28 ENCOUNTER — Other Ambulatory Visit (HOSPITAL_COMMUNITY): Payer: Self-pay | Admitting: Family Medicine

## 2020-12-28 ENCOUNTER — Other Ambulatory Visit: Payer: Self-pay

## 2020-12-28 ENCOUNTER — Ambulatory Visit
Admission: RE | Admit: 2020-12-28 | Discharge: 2020-12-28 | Disposition: A | Discharge status: Home / Self Care | Payer: Medicare HMO | Source: Ambulatory Visit | Attending: Family Medicine | Admitting: Family Medicine

## 2020-12-28 DIAGNOSIS — M79604 Pain in right leg: Secondary | ICD-10-CM | POA: Insufficient documentation

## 2020-12-28 DIAGNOSIS — M7989 Other specified soft tissue disorders: Secondary | ICD-10-CM | POA: Insufficient documentation

## 2020-12-28 DIAGNOSIS — W19XXXA Unspecified fall, initial encounter: Secondary | ICD-10-CM | POA: Insufficient documentation

## 2021-01-17 ENCOUNTER — Inpatient Hospital Stay
Admission: EM | Admit: 2021-01-17 | Discharge: 2021-01-24 | DRG: 480 | Disposition: A | Discharge status: Skilled Nursing Facility | Payer: Medicare HMO | Attending: Internal Medicine | Admitting: Internal Medicine

## 2021-01-17 ENCOUNTER — Emergency Department: Payer: Medicare HMO

## 2021-01-17 ENCOUNTER — Other Ambulatory Visit: Payer: Self-pay

## 2021-01-17 DIAGNOSIS — Z9011 Acquired absence of right breast and nipple: Secondary | ICD-10-CM

## 2021-01-17 DIAGNOSIS — Z881 Allergy status to other antibiotic agents status: Secondary | ICD-10-CM

## 2021-01-17 DIAGNOSIS — S42402A Unspecified fracture of lower end of left humerus, initial encounter for closed fracture: Secondary | ICD-10-CM | POA: Diagnosis not present

## 2021-01-17 DIAGNOSIS — J449 Chronic obstructive pulmonary disease, unspecified: Secondary | ICD-10-CM | POA: Diagnosis present

## 2021-01-17 DIAGNOSIS — I10 Essential (primary) hypertension: Secondary | ICD-10-CM | POA: Diagnosis not present

## 2021-01-17 DIAGNOSIS — Z20822 Contact with and (suspected) exposure to covid-19: Secondary | ICD-10-CM | POA: Diagnosis present

## 2021-01-17 DIAGNOSIS — G309 Alzheimer's disease, unspecified: Secondary | ICD-10-CM | POA: Diagnosis present

## 2021-01-17 DIAGNOSIS — E43 Unspecified severe protein-calorie malnutrition: Secondary | ICD-10-CM | POA: Diagnosis present

## 2021-01-17 DIAGNOSIS — Z888 Allergy status to other drugs, medicaments and biological substances status: Secondary | ICD-10-CM

## 2021-01-17 DIAGNOSIS — S72012A Unspecified intracapsular fracture of left femur, initial encounter for closed fracture: Secondary | ICD-10-CM | POA: Diagnosis present

## 2021-01-17 DIAGNOSIS — W19XXXA Unspecified fall, initial encounter: Secondary | ICD-10-CM | POA: Diagnosis not present

## 2021-01-17 DIAGNOSIS — K219 Gastro-esophageal reflux disease without esophagitis: Secondary | ICD-10-CM | POA: Diagnosis not present

## 2021-01-17 DIAGNOSIS — Z8249 Family history of ischemic heart disease and other diseases of the circulatory system: Secondary | ICD-10-CM

## 2021-01-17 DIAGNOSIS — Z880 Allergy status to penicillin: Secondary | ICD-10-CM

## 2021-01-17 DIAGNOSIS — Z803 Family history of malignant neoplasm of breast: Secondary | ICD-10-CM

## 2021-01-17 DIAGNOSIS — I480 Paroxysmal atrial fibrillation: Secondary | ICD-10-CM | POA: Diagnosis present

## 2021-01-17 DIAGNOSIS — N184 Chronic kidney disease, stage 4 (severe): Secondary | ICD-10-CM | POA: Diagnosis present

## 2021-01-17 DIAGNOSIS — S72002A Fracture of unspecified part of neck of left femur, initial encounter for closed fracture: Secondary | ICD-10-CM | POA: Diagnosis present

## 2021-01-17 DIAGNOSIS — I739 Peripheral vascular disease, unspecified: Secondary | ICD-10-CM | POA: Diagnosis present

## 2021-01-17 DIAGNOSIS — I951 Orthostatic hypotension: Secondary | ICD-10-CM | POA: Diagnosis present

## 2021-01-17 DIAGNOSIS — D72829 Elevated white blood cell count, unspecified: Secondary | ICD-10-CM | POA: Diagnosis present

## 2021-01-17 DIAGNOSIS — K52831 Collagenous colitis: Secondary | ICD-10-CM | POA: Diagnosis present

## 2021-01-17 DIAGNOSIS — F32A Depression, unspecified: Secondary | ICD-10-CM | POA: Diagnosis present

## 2021-01-17 DIAGNOSIS — W010XXA Fall on same level from slipping, tripping and stumbling without subsequent striking against object, initial encounter: Secondary | ICD-10-CM | POA: Diagnosis present

## 2021-01-17 DIAGNOSIS — Y92511 Restaurant or cafe as the place of occurrence of the external cause: Secondary | ICD-10-CM | POA: Diagnosis not present

## 2021-01-17 DIAGNOSIS — Z853 Personal history of malignant neoplasm of breast: Secondary | ICD-10-CM | POA: Diagnosis not present

## 2021-01-17 DIAGNOSIS — Z882 Allergy status to sulfonamides status: Secondary | ICD-10-CM

## 2021-01-17 DIAGNOSIS — Z885 Allergy status to narcotic agent status: Secondary | ICD-10-CM | POA: Diagnosis not present

## 2021-01-17 DIAGNOSIS — F02A0 Dementia in other diseases classified elsewhere, mild, without behavioral disturbance, psychotic disturbance, mood disturbance, and anxiety: Secondary | ICD-10-CM | POA: Diagnosis present

## 2021-01-17 DIAGNOSIS — S0003XA Contusion of scalp, initial encounter: Secondary | ICD-10-CM | POA: Diagnosis present

## 2021-01-17 DIAGNOSIS — Z79899 Other long term (current) drug therapy: Secondary | ICD-10-CM

## 2021-01-17 DIAGNOSIS — I130 Hypertensive heart and chronic kidney disease with heart failure and stage 1 through stage 4 chronic kidney disease, or unspecified chronic kidney disease: Secondary | ICD-10-CM | POA: Diagnosis present

## 2021-01-17 DIAGNOSIS — M4302 Spondylolysis, cervical region: Secondary | ICD-10-CM | POA: Diagnosis present

## 2021-01-17 DIAGNOSIS — S52022A Displaced fracture of olecranon process without intraarticular extension of left ulna, initial encounter for closed fracture: Secondary | ICD-10-CM | POA: Diagnosis present

## 2021-01-17 DIAGNOSIS — I5032 Chronic diastolic (congestive) heart failure: Secondary | ICD-10-CM | POA: Diagnosis present

## 2021-01-17 DIAGNOSIS — Z79890 Hormone replacement therapy (postmenopausal): Secondary | ICD-10-CM

## 2021-01-17 DIAGNOSIS — Z6822 Body mass index (BMI) 22.0-22.9, adult: Secondary | ICD-10-CM

## 2021-01-17 DIAGNOSIS — E034 Atrophy of thyroid (acquired): Secondary | ICD-10-CM | POA: Diagnosis present

## 2021-01-17 DIAGNOSIS — Z66 Do not resuscitate: Secondary | ICD-10-CM | POA: Diagnosis present

## 2021-01-17 DIAGNOSIS — Z7982 Long term (current) use of aspirin: Secondary | ICD-10-CM

## 2021-01-17 DIAGNOSIS — Z419 Encounter for procedure for purposes other than remedying health state, unspecified: Secondary | ICD-10-CM

## 2021-01-17 DIAGNOSIS — Z86718 Personal history of other venous thrombosis and embolism: Secondary | ICD-10-CM

## 2021-01-17 DIAGNOSIS — Z96653 Presence of artificial knee joint, bilateral: Secondary | ICD-10-CM | POA: Diagnosis present

## 2021-01-17 DIAGNOSIS — S51012A Laceration without foreign body of left elbow, initial encounter: Secondary | ICD-10-CM

## 2021-01-17 LAB — CBC WITH DIFFERENTIAL/PLATELET
Abs Immature Granulocytes: 0.16 10*3/uL — ABNORMAL HIGH (ref 0.00–0.07)
Basophils Absolute: 0 10*3/uL (ref 0.0–0.1)
Basophils Relative: 0 %
Eosinophils Absolute: 0.1 10*3/uL (ref 0.0–0.5)
Eosinophils Relative: 0 %
HCT: 41.5 % (ref 36.0–46.0)
Hemoglobin: 13.6 g/dL (ref 12.0–15.0)
Immature Granulocytes: 1 %
Lymphocytes Relative: 8 %
Lymphs Abs: 1.6 10*3/uL (ref 0.7–4.0)
MCH: 35 pg — ABNORMAL HIGH (ref 26.0–34.0)
MCHC: 32.8 g/dL (ref 30.0–36.0)
MCV: 106.7 fL — ABNORMAL HIGH (ref 80.0–100.0)
Monocytes Absolute: 2 10*3/uL — ABNORMAL HIGH (ref 0.1–1.0)
Monocytes Relative: 10 %
Neutro Abs: 15.6 10*3/uL — ABNORMAL HIGH (ref 1.7–7.7)
Neutrophils Relative %: 81 %
Platelets: 229 10*3/uL (ref 150–400)
RBC: 3.89 MIL/uL (ref 3.87–5.11)
RDW: 16.2 % — ABNORMAL HIGH (ref 11.5–15.5)
WBC: 19.5 10*3/uL — ABNORMAL HIGH (ref 4.0–10.5)
nRBC: 0 % (ref 0.0–0.2)

## 2021-01-17 LAB — PROTIME-INR
INR: 1 (ref 0.8–1.2)
Prothrombin Time: 13.5 seconds (ref 11.4–15.2)

## 2021-01-17 LAB — BASIC METABOLIC PANEL
Anion gap: 10 (ref 5–15)
BUN: 20 mg/dL (ref 8–23)
CO2: 27 mmol/L (ref 22–32)
Calcium: 8.9 mg/dL (ref 8.9–10.3)
Chloride: 103 mmol/L (ref 98–111)
Creatinine, Ser: 1.26 mg/dL — ABNORMAL HIGH (ref 0.44–1.00)
GFR, Estimated: 42 mL/min — ABNORMAL LOW (ref >60)
Glucose, Bld: 122 mg/dL — ABNORMAL HIGH (ref 70–99)
Potassium: 3.7 mmol/L (ref 3.5–5.1)
Sodium: 140 mmol/L (ref 135–145)

## 2021-01-17 LAB — RESP PANEL BY RT-PCR (FLU A&B, COVID) ARPGX2
Influenza A by PCR: NEGATIVE
Influenza B by PCR: NEGATIVE
SARS Coronavirus 2 by RT PCR: NEGATIVE

## 2021-01-17 LAB — TYPE AND SCREEN
ABO/RH(D): A POS
Antibody Screen: NEGATIVE

## 2021-01-17 MED ORDER — ONDANSETRON 4 MG PO TBDP
4.0000 mg | ORAL_TABLET | Freq: Once | ORAL | Status: AC
  Administered 2021-01-17: 4 mg via ORAL
  Filled 2021-01-17: qty 1

## 2021-01-17 MED ORDER — PANTOPRAZOLE SODIUM 40 MG IV SOLR
40.0000 mg | INTRAVENOUS | Status: DC
  Administered 2021-01-17 – 2021-01-19 (×3): 40 mg via INTRAVENOUS
  Filled 2021-01-17 (×3): qty 40

## 2021-01-17 MED ORDER — PAROXETINE HCL 20 MG PO TABS
20.0000 mg | ORAL_TABLET | Freq: Every day | ORAL | Status: DC
  Administered 2021-01-17 – 2021-01-24 (×7): 20 mg via ORAL
  Filled 2021-01-17 (×8): qty 1

## 2021-01-17 MED ORDER — HYDROCODONE-ACETAMINOPHEN 5-325 MG PO TABS
1.0000 | ORAL_TABLET | Freq: Two times a day (BID) | ORAL | Status: DC | PRN
  Administered 2021-01-18 – 2021-01-22 (×3): 1 via ORAL
  Filled 2021-01-17 (×4): qty 1

## 2021-01-17 MED ORDER — AMIODARONE HCL 200 MG PO TABS
200.0000 mg | ORAL_TABLET | Freq: Two times a day (BID) | ORAL | Status: DC
  Administered 2021-01-17 – 2021-01-20 (×5): 200 mg via ORAL
  Filled 2021-01-17 (×7): qty 1

## 2021-01-17 MED ORDER — LEVOTHYROXINE SODIUM 112 MCG PO TABS
112.0000 ug | ORAL_TABLET | Freq: Every day | ORAL | Status: DC
  Administered 2021-01-18 – 2021-01-24 (×6): 112 ug via ORAL
  Filled 2021-01-17 (×9): qty 1

## 2021-01-17 MED ORDER — METOPROLOL SUCCINATE ER 25 MG PO TB24
25.0000 mg | ORAL_TABLET | Freq: Every day | ORAL | Status: DC
  Administered 2021-01-17 – 2021-01-22 (×5): 25 mg via ORAL
  Filled 2021-01-17 (×5): qty 1

## 2021-01-17 MED ORDER — METOPROLOL SUCCINATE ER 50 MG PO TB24
50.0000 mg | ORAL_TABLET | Freq: Every day | ORAL | Status: DC
  Administered 2021-01-18 – 2021-01-22 (×5): 50 mg via ORAL
  Filled 2021-01-17 (×7): qty 1

## 2021-01-17 MED ORDER — SPIRONOLACTONE 25 MG PO TABS
25.0000 mg | ORAL_TABLET | Freq: Every day | ORAL | Status: DC
  Administered 2021-01-18: 25 mg via ORAL
  Filled 2021-01-17: qty 1

## 2021-01-17 MED ORDER — FENTANYL CITRATE PF 50 MCG/ML IJ SOSY
50.0000 ug | PREFILLED_SYRINGE | Freq: Once | INTRAMUSCULAR | Status: AC
Start: 2021-01-17 — End: 2021-01-17
  Administered 2021-01-17: 50 ug via INTRAVENOUS
  Filled 2021-01-17: qty 1

## 2021-01-17 MED ORDER — ACETAMINOPHEN 325 MG PO TABS
650.0000 mg | ORAL_TABLET | ORAL | Status: DC | PRN
  Administered 2021-01-17 – 2021-01-23 (×3): 650 mg via ORAL
  Filled 2021-01-17 (×3): qty 2

## 2021-01-17 NOTE — Consult Note (Addendum)
Full consult note to follow. Plan operative fixation of hip and elbow likely Thursday given recent dose of Eliquis. Please call with questions.   ORTHOPAEDIC CONSULTATION  REQUESTING PHYSICIAN: Fritzi Mandes, MD  Chief Complaint: left hip and elbow pain  HPI: [name] is a 87 y.o. female who complains of left hip and elbow pain. The pain is sharp in character. The pain is severe and 10/10. The pain is worse with movement and better with rest. Denies any numbness, tingling or constitutional symptoms. The history is obtained from the chart and her family due to dementia.  Past Medical History:  Diagnosis Date   (HFpEF) heart failure with preserved ejection fraction ([place])    a. 12/2015 Echo: EF 60-65%. no rwma, Gr2 DD, triv AI, mild MR, mildly dil LA. Mild-mod TR. PASP 43mmHg.   Anemia    Arthritis    "back, hands" (09/08/2015)   Cancer of right breast ([place]) 2007, 2013   right breast- 2007 radiation-mastectomy   Chronic lower back pain    CKD (chronic kidney disease), stage IV (HCC)    Complication of anesthesia    "took me about 1 week to know what was going on after one of my knee ORs"   COPD (chronic obstructive pulmonary disease) (HCC)    DDD (degenerative disc disease), lumbar    Depression    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   DVT (deep venous thrombosis) (HCC)    Edema    FEET/ANKLES   GERD (gastroesophageal reflux disease)    History of sepsis    History of stress test    a. 08/2010: EF 73%, no ischemia/infarct.   Hyperlipidemia    Hypertension    Hypothyroid    PAD (peripheral artery disease) ([place])    a. 08/2015 s/p PTA of R AT w/ drug-coated balloon angioplasty to R Popliteal; b. 05/2017 ABI: R 1.16, L 0.88-->stable.   PAF (paroxysmal atrial fibrillation) ([place])    a. 08/2010 s/p DCCV;  b. CHA2DS2VASc = 6-->No OAC 2/2 h/o falls.   PONV (postoperative nausea and vomiting)    nausea vomiting long ago with surgery but not recent ones   Past  Surgical History:  Procedure Laterality Date   ABDOMINAL EXPLORATION SURGERY     "had to go back in after hysterectomy & check on stitch dr had put near my bladder; don't know if they took it out; had to wear catheter for 1 month"   [place], ARTERY Right 09/08/2015   superficial femoral   BREAST EXCISIONAL BIOPSY Right 2007   positive   BREAST SURGERY     CARDIOVERSION  11/10/2011   Procedure: CARDIOVERSION;  Surgeon: Lelon Perla, MD;  [facility]: [facility];  Service: Cardiovascular;  Laterality: N/A;   CARDIOVERSION  08/26/2010   CATARACT EXTRACTION W/PHACO Right 07/17/2017   Procedure: CATARACT EXTRACTION PHACO AND INTRAOCULAR LENS PLACEMENT ([place]);  Surgeon: Birder Robson, MD;  Location: [facility] ORS;  Service: Ophthalmology;  Laterality: Right;  [place] 00:44.0 AP% 14.6 CDE 6.44 FLUID PACK LOT # [id number]   CATARACT EXTRACTION W/PHACO Left 08/07/2017   Procedure: CATARACT EXTRACTION PHACO AND INTRAOCULAR LENS PLACEMENT (I[facility]);  Surgeon: Birder Robson, MD;  Location: [facility] ORS;  Service: Ophthalmology;  Laterality: Left;  [place] 00:44 AP% 14.1 CDE 6.32 Fluid pack lot # [id number]   COLONOSCOPY     COLONOSCOPY WITH PROPOFOL N/A 01/04/2016  Procedure: COLONOSCOPY WITH PROPOFOL;  Surgeon: Lollie Sails, MD;  Location: [facility] ENDOSCOPY;  Service: Endoscopy;  Laterality: N/A;   EYE SURGERY     HALLUX VALGUS CORRECTION     JOINT REPLACEMENT     KYPHOPLASTY N/A 06/25/2018   Procedure: KYPHOPLASTY L3;  Surgeon: Hessie Knows, MD;  Location: [facility] ORS;  Service: Orthopedics;  Laterality: N/A;   LUMBAR DISC SURGERY     MASTECTOMY Right 2013   positive   PERIPHERAL VASCULAR CATHETERIZATION N/A 09/08/2015   Procedure: Abdominal Aortogram w/Lower Extremity;  Surgeon: Wellington Hampshire, MD;  Location: [place] [facility];  Service: Cardiovascular;  Laterality: N/A;   PERIPHERAL VASCULAR CATHETERIZATION  09/08/2015   Procedure: Peripheral  Vascular Balloon Angioplasty;  Surgeon: Wellington Hampshire, MD;  Location: [place] [facility];  Service: Cardiovascular;;   REVISION TOTAL KNEE ARTHROPLASTY Right    rt.ankle ulcer surgery  2013   TEMPOROMANDIBULAR JOINT ARTHROPLASTY     TMJ ARTHROPLASTY     TONSILLECTOMY     TOTAL KNEE ARTHROPLASTY Right    TOTAL KNEE ARTHROPLASTY Left 02/26/2018   Procedure: TOTAL KNEE ARTHROPLASTY;  Surgeon: Corky Mull, MD;  Location: [facility] ORS;  Service: Orthopedics;  Laterality: Left;   VASCULAR SURGERY     Social History   Socioeconomic History   Marital status: Widowed    Spouse name: Not on file   Number of children: 2   Years of education: Not on file   Highest education level: Not on file  Occupational History   Not on file  Tobacco Use   Smoking status: Never   Smokeless tobacco: Never  Vaping Use   Vaping Use: Never used  Substance and Sexual Activity   Alcohol use: No   Drug use: No   Sexual activity: Never    Birth control/protection: Post-menopausal  Other Topics Concern   Not on file  Social History Narrative   Not on file   Social Determinants of Health   Financial Resource Strain: Not on file  Food Insecurity: Not on file  Transportation Needs: Not on file  Physical Activity: Not on file  Stress: Not on file  Social Connections: Not on file   Family History  Problem Relation Age of Onset   Heart attack Father    Coronary artery disease Sister 95       MI   Breast cancer Maternal Aunt    Allergies  Allergen Reactions   Other Other (See Comments)    NO BP, VENIPUNCTURE, OR ACCESS IN RIGHT ARM - S/P MASTECTOMY ON RIGHT   Amlodipine Nausea And Vomiting   Ciprofloxacin Nausea Only   Doxycycline Itching and Other (See Comments)    shaky   Lipitor [Atorvastatin] Other (See Comments)    Joint pain    Morphine And Related Nausea And Vomiting   Macrobid [Nitrofurantoin Macrocrystal] Rash   Penicillins Rash and Other (See Comments)    Has patient had a PCN  reaction causing immediate rash, facial/tongue/throat swelling, SOB or lightheadedness with hypotension: yes Has patient had a PCN reaction causing severe rash involving mucus membranes or skin necrosis: no Has patient had a PCN reaction that required hospitalization no Has patient had a PCN reaction occurring within the last 10 years: no If all of the above answers are "NO", then may proceed with Cephalosporin use.    Sulfa Antibiotics Rash   Prior to Admission medications   Medication Sig Start Date End Date Taking? Authorizing Provider  acetaminophen (TYLENOL) 325  MG tablet Take 650 mg by mouth every 4 (four) hours as needed (pain).  03/03/18  Yes [provider]  acetaminophen (TYLENOL) 500 MG tablet Take 1,000 mg by mouth at bedtime as needed for moderate pain or headache.   Yes [provider]  amiodarone (PACERONE) 200 MG tablet Take 200 mg by mouth 2 (two) times daily. 11/04/20  Yes [provider]  aspirin EC 81 MG tablet Take 81 mg by mouth daily.   Yes [provider]  budesonide (ENTOCORT EC) 3 MG 24 hr capsule Take 9 mg by mouth every morning. 09/07/20  Yes [provider]  calcium-vitamin D (OSCAL WITH D) 500-200 MG-UNIT per tablet Take 1 tablet by mouth daily with breakfast.   Yes [provider]  Cholecalciferol (VITAMIN D3) 5000 units CAPS Take 5,000 Units by mouth daily.    Yes [provider]  docusate sodium (COLACE) 100 MG capsule Take 100 mg by mouth daily as needed for mild constipation.   Yes [provider]  famotidine (PEPCID) 40 MG tablet Take 40 mg by mouth at bedtime. 09/29/20  Yes [provider]  HYDROcodone-acetaminophen (NORCO/VICODIN) 5-325 MG tablet Take 0.5-1 tablets by mouth 2 (two) times daily as needed for pain. 11/10/20  Yes [provider]  levothyroxine (SYNTHROID) 112 MCG tablet Take 112 mcg by mouth daily before breakfast. 09/20/20  Yes [provider]   metoprolol succinate (TOPROL-XL) 50 MG 24 hr tablet Take 25-50 mg by mouth 2 (two) times daily. 1 tab in am and 1/2 tab in pm 09/30/20  Yes [provider]  Multiple Vitamins-Minerals (CENTROVITE) TABS Take 1 tablet by mouth daily. 03/04/18  Yes [provider]  PARoxetine (PAXIL) 20 MG tablet Take 1 tablet (20 mg total) by mouth daily. 03/19/18  Yes Gerlene Fee, NP  potassium chloride (K-DUR) 10 MEQ tablet Take 1 tablet (10 mEq total) by mouth 2 (two) times daily. 03/19/18  Yes Gerlene Fee, NP  spironolactone (ALDACTONE) 25 MG tablet Take 1 tablet (25 mg total) by mouth daily. 03/19/18  Yes Gerlene Fee, NP  lactose free nutrition (BOOST) LIQD Take 237 mLs by mouth daily.    [provider]  NON FORMULARY Diet type: NAS, Regular consistency, thin liquids ok    [provider]  Wound Dressings (ALLEVYN GENTLE BORDER EX) Apply 1 patch topically every 3 (three) days. Apply to left lower extremity every 3 days cleanse with saline; apply skin prep to surrounding skin and apply patch every 3 days.     [provider]   DG Pelvis 1-2 Views  Result Date: 01/17/2021 CLINICAL DATA:  Left upper leg pain after fall EXAM: PELVIS - 1-2 VIEW; LEFT FEMUR 2 VIEWS COMPARISON:  None. FINDINGS: Acute mildly impacted subcapital fracture of the left femoral neck. No significant angulation. No dislocation. Remainder of the left femur is intact. Prior left total knee arthroplasty without complication. Mild diffuse bone demineralization. Advanced lower lumbar spondylosis status post prior L3 vertebral body cement augmentation. Vascular calcifications are present. IMPRESSION: Acute mildly impacted subcapital fracture of the left femoral neck. Electronically Signed   By: Davina Poke D.O.   On: 01/17/2021 18:06   DG Elbow 2 Views Left  Result Date: 01/17/2021 CLINICAL DATA:  Fall, elbow pain, difficulty straightening. EXAM: LEFT ELBOW - 2 VIEW COMPARISON:  None.  FINDINGS: Acute transverse fracture of the olecranon with proximal retraction and substantial rotation of the proximal fragment, leading to about 1.3 cm gap at  the fracture site. Elbow joint effusion observed with anterior sail sign. Suboptimal frontal projection due to required flexion of the elbow as the patient is unable to extend. Spurring of the coronoid process of the ulna is slightly irregular but not definitively fractured. IMPRESSION: 1. Acute transverse fracture of the olecranon with proximal retraction rotation of the proximal fragment. 2. Spurring of the coronoid process. 3. Elbow joint effusion. Electronically Signed   By: Van Clines M.D.   On: 01/17/2021 18:07   CT HEAD WO CONTRAST (5MM)  Result Date: 01/17/2021 CLINICAL DATA:  Fall, posterior head trauma.  Patient on Eliquis EXAM: CT HEAD WITHOUT CONTRAST CT CERVICAL SPINE WITHOUT CONTRAST TECHNIQUE: Multidetector CT imaging of the head and cervical spine was performed following the standard protocol without intravenous contrast. Multiplanar CT image reconstructions of the cervical spine were also generated. COMPARISON:  12/20/2020 FINDINGS: CT HEAD FINDINGS Brain: No evidence of acute infarction, hemorrhage, hydrocephalus, extra-axial collection or mass lesion/mass effect. Remote lacunar infarct in the right basal ganglia. Moderate low-density changes within the periventricular and subcortical white matter compatible with chronic microvascular ischemic change. Mild diffuse cerebral volume loss. Vascular: Atherosclerotic calcifications involving the large vessels of the skull base. No unexpected hyperdense vessel. Skull: Normal. Negative for fracture or focal lesion. Sinuses/Orbits: No acute finding. Other: Negative for scalp hematoma. CT CERVICAL SPINE FINDINGS Alignment: Facet joints are aligned without dislocation or traumatic listhesis. Dens and lateral masses are aligned. Degenerative facet-mediated grade 1 anterolisthesis of C3 on C4  and C4 on C5 are unchanged. Skull base and vertebrae: No acute fracture. No primary bone lesion or focal pathologic process. Soft tissues and spinal canal: No prevertebral fluid or swelling. No visible canal hematoma. Disc levels: Similar degree of advanced multilevel facet arthropathy and degenerative disc disease within the cervical spine. Upper chest: Layering left-sided pleural effusion. Other: Bilateral carotid atherosclerosis. IMPRESSION: 1. No acute intracranial findings. 2. No evidence of acute fracture or traumatic listhesis of the cervical spine. 3. Layering left-sided pleural effusion. Recommend dedicated chest radiographs for further evaluation. 4. Multilevel cervical spondylosis. Electronically Signed   By: Davina Poke D.O.   On: 01/17/2021 18:53   CT CERVICAL SPINE WO CONTRAST  Result Date: 01/17/2021 CLINICAL DATA:  Fall, posterior head trauma.  Patient on Eliquis EXAM: CT HEAD WITHOUT CONTRAST CT CERVICAL SPINE WITHOUT CONTRAST TECHNIQUE: Multidetector CT imaging of the head and cervical spine was performed following the standard protocol without intravenous contrast. Multiplanar CT image reconstructions of the cervical spine were also generated. COMPARISON:  12/20/2020 FINDINGS: CT HEAD FINDINGS Brain: No evidence of acute infarction, hemorrhage, hydrocephalus, extra-axial collection or mass lesion/mass effect. Remote lacunar infarct in the right basal ganglia. Moderate low-density changes within the periventricular and subcortical white matter compatible with chronic microvascular ischemic change. Mild diffuse cerebral volume loss. Vascular: Atherosclerotic calcifications involving the large vessels of the skull base. No unexpected hyperdense vessel. Skull: Normal. Negative for fracture or focal lesion. Sinuses/Orbits: No acute finding. Other: Negative for scalp hematoma. CT CERVICAL SPINE FINDINGS Alignment: Facet joints are aligned without dislocation or traumatic listhesis. Dens and  lateral masses are aligned. Degenerative facet-mediated grade 1 anterolisthesis of C3 on C4 and C4 on C5 are unchanged. Skull base and vertebrae: No acute fracture. No primary bone lesion or focal pathologic process. Soft tissues and spinal canal: No prevertebral fluid or swelling. No visible canal hematoma. Disc levels: Similar degree of advanced multilevel facet arthropathy and degenerative disc disease within the cervical spine. Upper chest: Layering left-sided pleural effusion.  Other: Bilateral carotid atherosclerosis. IMPRESSION: 1. No acute intracranial findings. 2. No evidence of acute fracture or traumatic listhesis of the cervical spine. 3. Layering left-sided pleural effusion. Recommend dedicated chest radiographs for further evaluation. 4. Multilevel cervical spondylosis. Electronically Signed   By: Davina Poke D.O.   On: 01/17/2021 18:53   DG Chest Portable 1 View  Result Date: 01/17/2021 CLINICAL DATA:  Fall, hip fracture EXAM: PORTABLE CHEST 1 VIEW COMPARISON:  CT chest 04/22/2014,  CT abdomen 08/31/2020. FINDINGS: Normal cardiac silhouette. There is a LEFT pleural effusion. No rib fracture or pneumothorax identified. There is underlying interstitial edema pattern. No pneumothorax. No RIGHT effusion IMPRESSION: Unilateral LEFT pleural effusion. No rib fracture or pneumothorax is identified. Electronically Signed   By: Suzy Bouchard M.D.   On: 01/17/2021 20:04   DG Femur Min 2 Views Left  Result Date: 01/17/2021 CLINICAL DATA:  Left upper leg pain after fall EXAM: PELVIS - 1-2 VIEW; LEFT FEMUR 2 VIEWS COMPARISON:  None. FINDINGS: Acute mildly impacted subcapital fracture of the left femoral neck. No significant angulation. No dislocation. Remainder of the left femur is intact. Prior left total knee arthroplasty without complication. Mild diffuse bone demineralization. Advanced lower lumbar spondylosis status post prior L3 vertebral body cement augmentation. Vascular calcifications are  present. IMPRESSION: Acute mildly impacted subcapital fracture of the left femoral neck. Electronically Signed   By: Davina Poke D.O.   On: 01/17/2021 18:06    Positive ROS: All other systems have been reviewed and were otherwise negative with the exception of those mentioned in the HPI and as above.  Physical Exam: General: Alert, no acute distress Cardiovascular: No pedal edema Respiratory: No cyanosis, no use of accessory musculature GI: No organomegaly, abdomen is soft and non-tender Skin: No lesions in the area of chief complaint Neurologic: Sensation intact distally Psychiatric: Patient is not competent for consent with mild distress Lymphatic: No axillary or cervical lymphadenopathy  MUSCULOSKELETAL: left hip pain with IR/ER. Compartments soft. Good cap refill. Motor and sensory intact distally. Left elbow with small skin tear, moderate swelling, compartments soft, good cap refill, motor and sensory intact  Assessment: Left hip valgus impacted fracture, closed, displaced Left elbow olecranon fracture closed, displaced  Plan: Plan ORIF of her elbow and CRPP of the left hip if cleared by hospitalist team. She has not been on Eliquis.   The diagnosis, risks, benefits and alternatives to treatment are all discussed in detail with the family. Risks include but are not limited to bleeding, infection, deep vein thrombosis, pulmonary embolism, nerve or vascular injury, non-union, repeat operation, persistent pain, weakness, stiffness and death. The patient's family understands and is eager to proceed.     Lovell Sheehan, MD    01/19/2021 4:55 PM

## 2021-01-17 NOTE — H&P (Signed)
History and Physical    [name] YIR:[id number] DOB: 23-Jul-1932 DOA: 01/17/2021  PCP: Rusty Aus, MD    Patient coming from:  [facility]   Chief Complaint:  Fall    HPI: [name] is a 87 y.o. female seen in ed with complaints of fall at [facility], pt had her walker as well.pt slipped and fell. Pt has c/o left elbow and left hip pain, back of her head also was injured and has hematoma, pt is on Eliquis. With no LOC. Pt was just admitted on 12/21/20 for syncope and collapse and found to have GIB with hb of 6.2 and  pt was transfused one unit PRBC.  Pt has past medical history of Htn, PVD,GERD, hypothyroidism,anemia, ckd,arthritis, depression.  ED Course:  Vitals:   01/17/21 1842 01/17/21 1950  BP: (!) 192/111 (!) 162/111  Pulse: 94 86  Resp:  15  Temp: 97.6 F (36.4 C) 97.6 F (36.4 C)  TempSrc: Oral   SpO2: 93% 100%  In ed pt is hypertensive and afebrile / awake/ alert. Ekg shows a.fib 94, rbbb. CMP shows glucose of 122 and creatinine improved to 1.6, WBC count of 19.5 hemoglobin of 13.6 RDW of 16.2 MCV of 106.7 platelets of 229.  Labs from 12/24/2020 : ordered todays labs  shows creatinine of 1.48, which is little better than before.  Lab Results  Component Value Date   CREATININE 1.26 (H) 01/17/2021   CREATININE 1.48 (H) 12/24/2020   CREATININE 1.48 (H) 12/21/2020    Cbc shows normal wbc count and hb of 9.5 with mcv of 103.6, plt count of 193.  Ct head and c-spine: IMPRESSION: 1. No acute intracranial findings. 2. No evidence of acute fracture or traumatic listhesis of the cervical spine. 3. Layering left-sided pleural effusion. Recommend dedicated chest radiographs for further evaluation. 4. Multilevel cervical spondylosis.   Review of Systems:  Review of Systems  Unable to perform ROS: Age  Respiratory:  Negative for shortness of breath.   Cardiovascular:  Negative for chest pain.  Gastrointestinal:  Positive for diarrhea.  Musculoskeletal:  Positive  for falls and joint pain.    Past Medical History:  Diagnosis Date   (HFpEF) heart failure with preserved ejection fraction ([place])    a. 12/2015 Echo: EF 60-65%. no rwma, Gr2 DD, triv AI, mild MR, mildly dil LA. Mild-mod TR. PASP 68mmHg.   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 08/2010: EF 73%, no ischemia/infarct.   Hyperlipidemia    Hypertension    Hypothyroid    PAD (peripheral artery disease) ([place])    a. 08/2015 s/p PTA of R AT w/ drug-coated balloon angioplasty to R Popliteal; b. 05/2017 ABI: R 1.16, L 0.88-->stable.   PAF (paroxysmal  atrial fibrillation) ([place])    a. 08/2010 s/p DCCV;  b. CHA2DS2VASc = 6-->No OAC 2/2 h/o falls.   PONV (postoperative nausea and vomiting)    nausea vomiting long ago with surgery but not recent ones    Past Surgical History:  Procedure Laterality Date   ABDOMINAL EXPLORATION SURGERY     "had to go back in after hysterectomy & check on stitch dr had put near my bladder; don't know if they took it out; had to wear catheter for 1 month"   [place], ARTERY Right 09/08/2015   superficial femoral   BREAST EXCISIONAL BIOPSY Right 2007   positive   BREAST SURGERY     CARDIOVERSION  11/10/2011   Procedure: CARDIOVERSION;  Surgeon: Lelon Perla, MD;  Location: [facility] OR;  Service:  Cardiovascular;  Laterality: N/A;   CARDIOVERSION  08/26/2010   CATARACT EXTRACTION W/PHACO Right 07/17/2017   Procedure: CATARACT EXTRACTION PHACO AND INTRAOCULAR LENS PLACEMENT ([place]);  Surgeon: Birder Robson, MD;  Location: [facility] ORS;  Service: Ophthalmology;  Laterality: Right;  [place] 00:44.0 AP% 14.6 CDE 6.44 FLUID PACK LOT # [id number]   CATARACT EXTRACTION W/PHACO Left 08/07/2017   Procedure: CATARACT EXTRACTION PHACO AND INTRAOCULAR LENS PLACEMENT ([facility]);  Surgeon: Birder Robson, MD;  Location: [facility] ORS;  Service: Ophthalmology;  Laterality: Left;  [place] 00:44 AP% 14.1 CDE 6.32 Fluid pack lot # [id number]   COLONOSCOPY     COLONOSCOPY WITH PROPOFOL N/A 01/04/2016   Procedure: COLONOSCOPY WITH PROPOFOL;  Surgeon: Lollie Sails, MD;  Location: [facility] ENDOSCOPY;  Service: Endoscopy;  Laterality: N/A;   EYE SURGERY     HALLUX VALGUS CORRECTION     JOINT REPLACEMENT     KYPHOPLASTY N/A 06/25/2018   Procedure: KYPHOPLASTY L3;  Surgeon: Hessie Knows, MD;  Location: [facility] ORS;  Service: Orthopedics;  Laterality: N/A;   LUMBAR DISC SURGERY     MASTECTOMY Right 2013   positive   PERIPHERAL VASCULAR CATHETERIZATION N/A 09/08/2015   Procedure: Abdominal Aortogram w/Lower Extremity;  Surgeon: Wellington Hampshire, MD;  Location: [place] CV LAB;  Service: Cardiovascular;  Laterality: N/A;   PERIPHERAL VASCULAR CATHETERIZATION  09/08/2015   Procedure: Peripheral Vascular Balloon Angioplasty;  Surgeon: Wellington Hampshire, MD;  Location: [place] LAB;  Service: Cardiovascular;;   REVISION TOTAL KNEE ARTHROPLASTY Right    rt.ankle ulcer surgery  2013   TEMPOROMANDIBULAR JOINT ARTHROPLASTY     TMJ ARTHROPLASTY     TONSILLECTOMY     TOTAL KNEE ARTHROPLASTY Right    TOTAL KNEE ARTHROPLASTY Left 02/26/2018   Procedure: TOTAL KNEE ARTHROPLASTY;  Surgeon: Corky Mull, MD;  Location: [facility] ORS;  Service: Orthopedics;  Laterality: Left;   VASCULAR SURGERY       reports that she has never smoked. She  has never used smokeless tobacco. She reports that she does not drink alcohol and does not use drugs.  Allergies  Allergen Reactions   Other Other (See Comments)    NO BP, VENIPUNCTURE, OR ACCESS IN RIGHT ARM - S/P MASTECTOMY ON RIGHT   Amlodipine Nausea And Vomiting   Ciprofloxacin Nausea Only   Doxycycline Itching and Other (See Comments)    shaky   Lipitor [Atorvastatin] Other (See Comments)    Joint pain    Morphine And Related Nausea And Vomiting   Macrobid [Nitrofurantoin Macrocrystal] Rash   Penicillins Rash and Other (See Comments)  Has patient had a PCN reaction causing immediate rash, facial/tongue/throat swelling, SOB or lightheadedness with hypotension: yes Has patient had a PCN reaction causing severe rash involving mucus membranes or skin necrosis: no Has patient had a PCN reaction that required hospitalization no Has patient had a PCN reaction occurring within the last 10 years: no If all of the above answers are "NO", then may proceed with Cephalosporin use.    Sulfa Antibiotics Rash    Family History  Problem Relation Age of Onset   Heart attack Father    Coronary artery disease Sister 47       MI   Breast cancer Maternal Aunt     Prior to Admission medications   Medication Sig Start Date End Date Taking? Authorizing Provider  acetaminophen (TYLENOL) 325 MG tablet Take 650 mg by mouth every 4 (four) hours as needed (pain).  03/03/18   [provider]  acetaminophen (TYLENOL) 500 MG tablet Take 1,000 mg by mouth at bedtime as needed for moderate pain or headache.    [provider]  amiodarone (PACERONE) 200 MG tablet Take 200 mg by mouth 2 (two) times daily. 11/04/20   [provider]  aspirin EC 81 MG tablet Take 81 mg by mouth daily.    [provider]  budesonide (ENTOCORT EC) 3 MG 24 hr capsule Take 9 mg by mouth every morning. 09/07/20   [provider]  calcium-vitamin D (OSCAL WITH D) 500-200 MG-UNIT per  tablet Take 1 tablet by mouth daily with breakfast.    [provider]  Cholecalciferol (VITAMIN D3) 5000 units CAPS Take 5,000 Units by mouth daily.     [provider]  docusate sodium (COLACE) 100 MG capsule Take 100 mg by mouth daily as needed for mild constipation.    [provider]  donepezil (ARICEPT) 5 MG tablet Take 5 mg by mouth at bedtime. 04/15/18   [provider]  ELIQUIS 2.5 MG TABS tablet Hold this medication due to recent falls resulting in hematomas.  Consider discontinuing after discussing with your cardiologist. 12/21/20   Enzo Bi, MD  famotidine (PEPCID) 40 MG tablet Take 40 mg by mouth at bedtime. 09/29/20   [provider]  guaifenesin (ROBITUSSIN) 100 MG/5ML syrup Take 200 mg by mouth 3 (three) times daily as needed for cough.    [provider]  HYDROcodone-acetaminophen (NORCO/VICODIN) 5-325 MG tablet Take 0.5-1 tablets by mouth 2 (two) times daily as needed for pain. 11/10/20   [provider]  hyoscyamine (LEVSIN) 0.125 MG tablet Take 0.125 mg by mouth every 6 (six) hours as needed for cramping or diarrhea or loose stools. 08/17/20   [provider]  lactose free nutrition (BOOST) LIQD Take 237 mLs by mouth daily.    [provider]  levothyroxine (SYNTHROID) 112 MCG tablet Take 112 mcg by mouth daily before breakfast. 09/20/20   [provider]  metoprolol succinate (TOPROL-XL) 50 MG 24 hr tablet Take 25-50 mg by mouth 2 (two) times daily. 09/30/20   [provider]  Multiple Vitamins-Minerals (CENTROVITE) TABS Take 1 tablet by mouth daily. 03/04/18   [provider]  NON FORMULARY Diet type: NAS, Regular consistency, thin liquids ok    [provider]  PARoxetine (PAXIL) 20 MG tablet Take 1 tablet (20 mg total) by mouth daily. 03/19/18   Gerlene Fee, NP  potassium chloride (K-DUR) 10 MEQ tablet Take 1 tablet (10 mEq total) by mouth 2 (two) times daily.  03/19/18  Gerlene Fee, NP  spironolactone (ALDACTONE) 25 MG tablet Take 1 tablet (25 mg total) by mouth daily. 03/19/18   Gerlene Fee, NP  torsemide (DEMADEX) 20 MG tablet Take 1 tablet (20 mg total) by mouth daily as needed. 12/21/20   Enzo Bi, MD  Wound Dressings (ALLEVYN GENTLE BORDER EX) Apply 1 patch topically every 3 (three) days. Apply to left lower extremity every 3 days cleanse with saline; apply skin prep to surrounding skin and apply patch every 3 days.     [provider]    Physical Exam: Vitals:   01/17/21 1842 01/17/21 1950  BP: (!) 192/111 (!) 162/111  Pulse: 94 86  Resp:  15  Temp: 97.6 F (36.4 C) 97.6 F (36.4 C)  TempSrc: Oral   SpO2: 93% 100%   Physical Exam Constitutional:      General: She is not in acute distress.    Appearance: She is not ill-appearing.  HENT:     Head: Normocephalic and atraumatic.     Right Ear: External ear normal.     Left Ear: External ear normal.  Eyes:     Extraocular Movements: Extraocular movements intact.     Pupils: Pupils are equal, round, and reactive to light.  Cardiovascular:     Rate and Rhythm: Normal rate. Rhythm irregular.     Pulses: Normal pulses.  Abdominal:     General: Bowel sounds are normal. There is no distension.     Palpations: Abdomen is soft. There is no mass.     Tenderness: There is no abdominal tenderness. There is no guarding.     Hernia: No hernia is present.  Musculoskeletal:     Right lower leg: Edema present.     Left lower leg: Edema present.  Neurological:     General: No focal deficit present.     Mental Status: She is alert and oriented to person, place, and time.  Psychiatric:        Mood and Affect: Mood normal.        Behavior: Behavior normal.    Labs on Admission: I have personally reviewed following labs and imaging studies  No results for input(s): CKTOTAL, CKMB, TROPONINI in the last 72 hours. Lab Results  Component Value Date   WBC 19.5 (H) 01/17/2021    HGB 13.6 01/17/2021   HCT 41.5 01/17/2021   MCV 106.7 (H) 01/17/2021   PLT 229 01/17/2021    Recent Labs  Lab 01/17/21 2016  NA 140  K 3.7  CL 103  CO2 27  BUN 20  CREATININE 1.26*  CALCIUM 8.9  GLUCOSE 122*   No results found for: CHOL, HDL, LDLCALC, TRIG No results found for: DDIMER Invalid input(s): POCBNP  Urinalysis    Component Value Date/Time   COLORURINE YELLOW (A) 08/31/2020 1425   APPEARANCEUR CLEAR (A) 08/31/2020 1425   APPEARANCEUR Clear 08/29/2013 1027   LABSPEC 1.017 08/31/2020 1425   LABSPEC 1.005 08/29/2013 1027   PHURINE 5.0 08/31/2020 1425   GLUCOSEU NEGATIVE 08/31/2020 1425   GLUCOSEU Negative 08/29/2013 1027   HGBUR NEGATIVE 08/31/2020 [place] 08/31/2020 1425   BILIRUBINUR Negative 08/29/2013 [place] 08/31/2020 1425   PROTEINUR NEGATIVE 08/31/2020 1425   NITRITE NEGATIVE 08/31/2020 1425   LEUKOCYTESUR SMALL (A) 08/31/2020 1425   LEUKOCYTESUR Negative 08/29/2013 1027    COVID-19 Labs No results for input(s): DDIMER, FERRITIN, LDH, CRP in the last 72 hours. Lab Results  Component Value Date  [place] NEGATIVE 12/20/2020    Radiological Exams on Admission: DG Pelvis 1-2 Views  Result Date: 01/17/2021 CLINICAL DATA:  Left upper leg pain after fall EXAM: PELVIS - 1-2 VIEW; LEFT FEMUR 2 VIEWS COMPARISON:  None. FINDINGS: Acute mildly impacted subcapital fracture of the left femoral neck. No significant angulation. No dislocation. Remainder of the left femur is intact. Prior left total knee arthroplasty without complication. Mild diffuse bone demineralization. Advanced lower lumbar spondylosis status post prior L3 vertebral body cement augmentation. Vascular calcifications are present. IMPRESSION: Acute mildly impacted subcapital fracture of the left femoral neck. Electronically Signed   By: Davina Poke D.O.   On: 01/17/2021 18:06   DG Elbow 2 Views Left  Result Date: 01/17/2021 CLINICAL DATA:  Fall,  elbow pain, difficulty straightening. EXAM: LEFT ELBOW - 2 VIEW COMPARISON:  None. FINDINGS: Acute transverse fracture of the olecranon with proximal retraction and substantial rotation of the proximal fragment, leading to about 1.3 cm gap at the fracture site. Elbow joint effusion observed with anterior sail sign. Suboptimal frontal projection due to required flexion of the elbow as the patient is unable to extend. Spurring of the coronoid process of the ulna is slightly irregular but not definitively fractured. IMPRESSION: 1. Acute transverse fracture of the olecranon with proximal retraction rotation of the proximal fragment. 2. Spurring of the coronoid process. 3. Elbow joint effusion. Electronically Signed   By: Van Clines M.D.   On: 01/17/2021 18:07   CT HEAD WO CONTRAST (5MM)  Result Date: 01/17/2021 CLINICAL DATA:  Fall, posterior head trauma.  Patient on Eliquis EXAM: CT HEAD WITHOUT CONTRAST CT CERVICAL SPINE WITHOUT CONTRAST TECHNIQUE: Multidetector CT imaging of the head and cervical spine was performed following the standard protocol without intravenous contrast. Multiplanar CT image reconstructions of the cervical spine were also generated. COMPARISON:  12/20/2020 FINDINGS: CT HEAD FINDINGS Brain: No evidence of acute infarction, hemorrhage, hydrocephalus, extra-axial collection or mass lesion/mass effect. Remote lacunar infarct in the right basal ganglia. Moderate low-density changes within the periventricular and subcortical white matter compatible with chronic microvascular ischemic change. Mild diffuse cerebral volume loss. Vascular: Atherosclerotic calcifications involving the large vessels of the skull base. No unexpected hyperdense vessel. Skull: Normal. Negative for fracture or focal lesion. Sinuses/Orbits: No acute finding. Other: Negative for scalp hematoma. CT CERVICAL SPINE FINDINGS Alignment: Facet joints are aligned without dislocation or traumatic listhesis. Dens and lateral  masses are aligned. Degenerative facet-mediated grade 1 anterolisthesis of C3 on C4 and C4 on C5 are unchanged. Skull base and vertebrae: No acute fracture. No primary bone lesion or focal pathologic process. Soft tissues and spinal canal: No prevertebral fluid or swelling. No visible canal hematoma. Disc levels: Similar degree of advanced multilevel facet arthropathy and degenerative disc disease within the cervical spine. Upper chest: Layering left-sided pleural effusion. Other: Bilateral carotid atherosclerosis. IMPRESSION: 1. No acute intracranial findings. 2. No evidence of acute fracture or traumatic listhesis of the cervical spine. 3. Layering left-sided pleural effusion. Recommend dedicated chest radiographs for further evaluation. 4. Multilevel cervical spondylosis. Electronically Signed   By: Davina Poke D.O.   On: 01/17/2021 18:53   CT CERVICAL SPINE WO CONTRAST  Result Date: 01/17/2021 CLINICAL DATA:  Fall, posterior head trauma.  Patient on Eliquis EXAM: CT HEAD WITHOUT CONTRAST CT CERVICAL SPINE WITHOUT CONTRAST TECHNIQUE: Multidetector CT imaging of the head and cervical spine was performed following the standard protocol without intravenous contrast. Multiplanar CT image reconstructions of the cervical spine were also generated. COMPARISON:  12/20/2020  FINDINGS: CT HEAD FINDINGS Brain: No evidence of acute infarction, hemorrhage, hydrocephalus, extra-axial collection or mass lesion/mass effect. Remote lacunar infarct in the right basal ganglia. Moderate low-density changes within the periventricular and subcortical white matter compatible with chronic microvascular ischemic change. Mild diffuse cerebral volume loss. Vascular: Atherosclerotic calcifications involving the large vessels of the skull base. No unexpected hyperdense vessel. Skull: Normal. Negative for fracture or focal lesion. Sinuses/Orbits: No acute finding. Other: Negative for scalp hematoma. CT CERVICAL SPINE FINDINGS  Alignment: Facet joints are aligned without dislocation or traumatic listhesis. Dens and lateral masses are aligned. Degenerative facet-mediated grade 1 anterolisthesis of C3 on C4 and C4 on C5 are unchanged. Skull base and vertebrae: No acute fracture. No primary bone lesion or focal pathologic process. Soft tissues and spinal canal: No prevertebral fluid or swelling. No visible canal hematoma. Disc levels: Similar degree of advanced multilevel facet arthropathy and degenerative disc disease within the cervical spine. Upper chest: Layering left-sided pleural effusion. Other: Bilateral carotid atherosclerosis. IMPRESSION: 1. No acute intracranial findings. 2. No evidence of acute fracture or traumatic listhesis of the cervical spine. 3. Layering left-sided pleural effusion. Recommend dedicated chest radiographs for further evaluation. 4. Multilevel cervical spondylosis. Electronically Signed   By: Davina Poke D.O.   On: 01/17/2021 18:53   DG Chest Portable 1 View  Result Date: 01/17/2021 CLINICAL DATA:  Fall, hip fracture EXAM: PORTABLE CHEST 1 VIEW COMPARISON:  CT chest 04/22/2014,  CT abdomen 08/31/2020. FINDINGS: Normal cardiac silhouette. There is a LEFT pleural effusion. No rib fracture or pneumothorax identified. There is underlying interstitial edema pattern. No pneumothorax. No RIGHT effusion IMPRESSION: Unilateral LEFT pleural effusion. No rib fracture or pneumothorax is identified. Electronically Signed   By: Suzy Bouchard M.D.   On: 01/17/2021 20:04   DG Femur Min 2 Views Left  Result Date: 01/17/2021 CLINICAL DATA:  Left upper leg pain after fall EXAM: PELVIS - 1-2 VIEW; LEFT FEMUR 2 VIEWS COMPARISON:  None. FINDINGS: Acute mildly impacted subcapital fracture of the left femoral neck. No significant angulation. No dislocation. Remainder of the left femur is intact. Prior left total knee arthroplasty without complication. Mild diffuse bone demineralization. Advanced lower lumbar  spondylosis status post prior L3 vertebral body cement augmentation. Vascular calcifications are present. IMPRESSION: Acute mildly impacted subcapital fracture of the left femoral neck. Electronically Signed   By: Davina Poke D.O.   On: 01/17/2021 18:06    EKG: Independently reviewed.  A.fib 94 with rbbb.   Assessment/Plan Principal Problem:   Fall Active Problems:   Closed left hip fracture (HCC)   Left elbow fracture   PAF (paroxysmal atrial fibrillation) (HCC)   Hypertension   Orthostatic hypotension   Chronic diastolic congestive heart failure (HCC)   GERD without esophagitis   Hypothyroidism due to acquired atrophy of thyroid   Fall: Aggressive physical therapy at and gait training strengthening exercises consideration for rehab upon discharge. Fall precautions.  Left hip fracture/left elbow fracture: Patient seen by orthopedics on-call Dr.Bowers,.  Suspect operative repair for left hip and nonoperative conservative regimen for the left.  Left elbow in the splint.  Paroxysmal atrial fibrillation:  Patient currently in atrial fibrillation, off of blood thinners secondary to GI bleed high risk of falls. Currently patient is on DVT prophylaxis only.  Hypertension/orthostatic hypotension: Blood pressure (!) 162/111, pulse 86, temperature 97.6 F (36.4 C), resp. rate 15, SpO2 100 %. Home regimen with metoprolol, Aldactone continued.  We will monitor blood pressure. Will obtain orthostatics start patient on  low-dose diuretic therapy for isolated systolic hypertension.  Chronic diastolic congestive heart failure: We will continue patient on metoprolol, and Aldactone. Euvolemic, no orthopnea or shortness of breath.  GERD without esophagitis/GI bleed with history of guaiac positive stool card: IV PPI therapy, evaluation by GI for GI bleed per a.m. team.  Hypothyroidism: Continue patient on levothyroxine.     Metoprolol, DVT prophylaxis:  SCD's   Code Status:   Full code     Family Communication:  Sween, Remo Lipps (Son)  [phone] (Mobile)   Disposition Plan:  TBD   Consults called:  Orthopedic-Dr.Bowers.   Admission status: Inpatient.     Para Skeans MD [facility] [phone] How to contact the [facility] Attending or Consulting provider [place] or covering provider during after hours [place], for this patient.    Check the care team in [facility] and look for a) attending/consulting [facility] provider listed and b) the [facility], The team listed Log into [email or web address] and use [place]'s universal password to access. If you do not have the password, please contact the hospital operator. Locate the [facility] provider you are looking for under [facility] and page to a number that you can be directly reached. If you still have difficulty reaching the provider, please page the [facility] (Director on Call) for the Hospitalists listed on amion for assistance. [email or web address] Password [facility] 01/17/2021, 9:17 PM

## 2021-01-17 NOTE — ED Notes (Signed)
Reviewed chart. No questions from ED at this time. Awaiting room cleaning by EVS, ED nurse made aware.

## 2021-01-17 NOTE — ED Provider Notes (Signed)
Emergency Medicine Provider Triage Evaluation Note  Haifa Hatton Hayse, a 87 y.o. female  was evaluated in triage.  Pt complains of presents to the ED via EMS from [employer], after she slipped and fell with her walker.  She presents with left elbow pain, as well as upper left leg pain.  Patient did hit the back of her head and has a nosebleed with hematoma.  She is on Eliquis daily.  He denies any LOC or subsequent nausea vomiting.  She endorses pain and disability to the left hip..  Review of Systems  Positive: Head injury, left elbow pain, left hip pain  Negative: LOC, NVD  Physical Exam  There were no vitals taken for this visit. Gen:   Awake, no distress  NAD Resp:  Normal effort CTA MSK:   Moves extremities without difficulty Left elbow and left hip pain  Other:  CVS: RRR  Medical Decision Making  Medically screening exam initiated at 6:17 PM.  Appropriate orders placed.  Pricilla Holm Zullo was informed that the remainder of the evaluation will be completed by another provider, this initial triage assessment does not replace that evaluation, and the importance of remaining in the ED until their evaluation is complete.  Patient with ED evaluation of injury sustained following a mechanical fall.   Melvenia Needles, PA-C 01/17/21 1830    Naaman Plummer, MD 01/18/21 2147

## 2021-01-17 NOTE — ED Notes (Signed)
Rachna Schonberger , pt's daughter contact info [phone]

## 2021-01-17 NOTE — ED Triage Notes (Addendum)
Pt comes into the ED via EMS from [place] a, pt states she slipped and fell with her walker,pt has left elbow pain ,unable to straighten her elbow, and upper left leg pain. States she did hit the back of her head and has a hematoma, pt is on eliquis  200/116 93%RA HR90

## 2021-01-17 NOTE — ED Provider Notes (Signed)
[facility] Emergency Department Provider Note  ____________________________________________   Event Date/Time   First MD Initiated Contact with Patient 01/17/21 1908     (approximate)  I have reviewed the triage vital signs and the nursing notes.   HISTORY  Chief Complaint Fall   HPI [name] is a 87 y.o. female with past medical history of COPD, CHF, HTN, HDL, GERD, hypothyroidism, paroxysmal A. fib on Eliquis, DVT and arthritis who presents coming by daughter for assessment of acute left elbow and left hip pain after a fall that occurred earlier today.  Patient states she is not exactly sure how she fell but was using a walker and thinks he may have tripped or slipped.  She is not sure if she had LOC or hit her head.  She states she only has pain right now in her left elbow and left hip.  She is denies any acute headache, neck pain, chest pain, abd pain or other extremity pain.  She notes she has bruising on right side of her face from fall 2 weeks ago has some chronic back pain.  No recent fevers, chills, cough, nausea, vomiting, diarrhea, burning with urination or other acute sick symptoms at this time.  She did take her Eliquis this morning however her daughter states she think patient has actually not been taking her Eliquis..         Past Medical History:  Diagnosis Date   (HFpEF) heart failure with preserved ejection fraction ([place])    a. 12/2015 Echo: EF 60-65%. no rwma, Gr2 DD, triv AI, mild MR, mildly dil LA. Mild-mod TR. PASP 63mmHg.   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 08/2010: EF 73%, no ischemia/infarct.   Hyperlipidemia    Hypertension    Hypothyroid    PAD (peripheral artery disease) ([place])    a. 08/2015 s/p PTA of R AT w/ drug-coated balloon angioplasty to R Popliteal; b. 05/2017 ABI: R 1.16, L 0.88-->stable.   PAF (paroxysmal atrial fibrillation) ([place])    a. 08/2010 s/p DCCV;  b. CHA2DS2VASc = 6-->No OAC 2/2 h/o falls.   PONV (postoperative nausea and vomiting)    nausea vomiting long ago with surgery but not recent ones    Patient Active Problem List   Diagnosis Date Noted   Syncope and collapse 12/20/2020   Acute deep vein thrombosis (DVT) of left peroneal vein ([place]) 03/06/2018   Arterial leg ulcer ([place]) 03/06/2018   Hypertensive heart and kidney disease with chronic diastolic congestive heart failure and stage 3 chronic kidney disease ([place]) 03/06/2018   GERD without esophagitis 03/06/2018   CKD (chronic kidney disease) stage 3, GFR 30-59 ml/min ([place]) 03/06/2018   Anemia 03/06/2018   Hypothyroidism due  to acquired atrophy of thyroid 03/06/2018   Dyslipidemia 03/06/2018   Primary osteoarthritis of left knee 03/06/2018   Status post total knee replacement using cement, left 02/26/2018   Orthostatic hypotension 01/14/2016   ARF (acute renal failure) ([place]) 01/14/2016   Dehydration 01/14/2016   Protein-calorie malnutrition, severe 01/12/2016   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   PAD (peripheral artery disease) ([place]) 08/10/2014   Chronic diastolic congestive heart failure ([place]) 02/26/2014   Renal insufficiency 09/14/2011   PAF (paroxysmal atrial fibrillation) ([place]) 08/09/2010   Hypertension 08/09/2010    Past Surgical History:  Procedure Laterality Date   ABDOMINAL EXPLORATION SURGERY      "had to go back in after hysterectomy & check on stitch dr had put near my bladder; don't know if they took it out; had to wear catheter for 1 month"   [place], ARTERY Right 09/08/2015   superficial femoral   BREAST EXCISIONAL BIOPSY Right 2007   positive   BREAST SURGERY     CARDIOVERSION  11/10/2011   Procedure: CARDIOVERSION;  Surgeon: Lelon Perla, MD;  Location: [place];  Service: Cardiovascular;  Laterality: N/A;   CARDIOVERSION  08/26/2010   CATARACT EXTRACTION W/PHACO Right 07/17/2017   Procedure: CATARACT EXTRACTION PHACO AND INTRAOCULAR LENS PLACEMENT ([place]);  Surgeon: Birder Robson, MD;  Location: [facility];  Service: Ophthalmology;  Laterality: Right;  [place] 00:44.0 AP% 14.6 CDE 6.44 FLUID PACK LOT # [id number]   CATARACT EXTRACTION W/PHACO Left 08/07/2017   Procedure: CATARACT EXTRACTION PHACO AND INTRAOCULAR LENS PLACEMENT ([facility]);  Surgeon: Birder Robson, MD;  Location: [facility];  Service: Ophthalmology;  Laterality: Left;  [place] 00:44 AP% 14.1 CDE 6.32 Fluid pack lot # [id number]   COLONOSCOPY     COLONOSCOPY WITH PROPOFOL N/A 01/04/2016   Procedure: COLONOSCOPY WITH PROPOFOL;  Surgeon: Lollie Sails, MD;  Location: [facility] END[facility];  Service: Endoscopy;  Laterality: N/A;   EYE SURGERY     HALLUX VALGUS CORRECTION     JOINT REPLACEMENT     KYPHOPLASTY N/A 06/25/2018   Procedure: KYPHOPLASTY L3;  Surgeon: Hessie Knows, MD;  Location: [facility];  Service: Orthopedics;  Laterality: N/A;   LUMBAR DISC SURGERY     MASTECTOMY Right 2013   positive   PERIPHERAL VASCULAR CATHETERIZATION N/A 09/08/2015   Procedure: Abdominal Aortogram w/Lower Extremity;  Surgeon: Wellington Hampshire, MD;  Location: [place] [facility];  Service: Cardiovascular;  Laterality: N/A;   PERIPHERAL VASCULAR CATHETERIZATION  09/08/2015   Procedure: Peripheral Vascular Balloon Angioplasty;  Surgeon: Wellington Hampshire, MD;  Location: [place] [facility];  Service: Cardiovascular;;   REVISION TOTAL KNEE ARTHROPLASTY Right    rt.ankle ulcer surgery  2013   TEMPOROMANDIBULAR JOINT ARTHROPLASTY     TMJ ARTHROPLASTY     TONSILLECTOMY     TOTAL KNEE ARTHROPLASTY Right    TOTAL KNEE ARTHROPLASTY Left 02/26/2018   Procedure: TOTAL KNEE ARTHROPLASTY;  Surgeon: Corky Mull, MD;  Location: [facility];  Service: Orthopedics;  Laterality: Left;   VASCULAR SURGERY      Prior to Admission medications   Medication Sig Start Date End Date Taking? Authorizing Provider  acetaminophen (TYLENOL) 325 MG tablet Take 650 mg by mouth every 4 (four) hours as needed (pain).  03/03/18   [provider]  acetaminophen (TYLENOL) 500 MG tablet Take 1,000 mg by mouth at bedtime as needed  for moderate pain or headache.    [provider]  amiodarone (PACERONE) 200 MG tablet Take 200 mg by mouth 2 (two) times daily. 11/04/20   [provider]  aspirin EC 81 MG tablet Take 81 mg by mouth daily.    [provider]  budesonide (ENTOCORT EC) 3 MG 24 hr capsule Take 9 mg by mouth every morning. 09/07/20   [provider]  calcium-vitamin D (OSCAL WITH D) 500-200 MG-UNIT per tablet Take 1 tablet by mouth daily with breakfast.    [provider]  Cholecalciferol (VITAMIN D3) 5000 units CAPS Take 5,000 Units by mouth daily.     [provider]  docusate sodium (COLACE) 100 MG capsule Take 100 mg by mouth daily as needed for mild constipation.    [provider]  donepezil (ARICEPT) 5 MG tablet Take 5 mg by mouth at bedtime. 04/15/18   [provider]  ELIQUIS 2.5 MG TABS tablet Hold this medication due to recent falls resulting in hematomas.  Consider discontinuing after discussing with your cardiologist. 12/21/20   Enzo Bi, MD  famotidine (PEPCID) 40 MG tablet Take 40 mg by mouth at bedtime. 09/29/20   [provider]  guaifenesin (ROBITUSSIN) 100 MG/5ML syrup Take 200 mg by  mouth 3 (three) times daily as needed for cough.    [provider]  HYDROcodone-acetaminophen (NORCO/VICODIN) 5-325 MG tablet Take 0.5-1 tablets by mouth 2 (two) times daily as needed for pain. 11/10/20   [provider]  hyoscyamine (LEVSIN) 0.125 MG tablet Take 0.125 mg by mouth every 6 (six) hours as needed for cramping or diarrhea or loose stools. 08/17/20   [provider]  lactose free nutrition (BOOST) LIQD Take 237 mLs by mouth daily.    [provider]  levothyroxine (SYNTHROID) 112 MCG tablet Take 112 mcg by mouth daily before breakfast. 09/20/20   [provider]  metoprolol succinate (TOPROL-XL) 50 MG 24 hr tablet Take 25-50 mg by mouth 2 (two) times daily. 09/30/20   [provider]  Multiple Vitamins-Minerals (CENTROVITE) TABS Take 1 tablet by mouth daily. 03/04/18   [provider]  NON FORMULARY Diet type: NAS, Regular consistency, thin liquids ok    [provider]  PARoxetine (PAXIL) 20 MG tablet Take 1 tablet (20 mg total) by mouth daily. 03/19/18   Gerlene Fee, NP  potassium chloride (K-DUR) 10 MEQ tablet Take 1 tablet (10 mEq total) by mouth 2 (two) times daily. 03/19/18   Gerlene Fee, NP  spironolactone (ALDACTONE) 25 MG tablet Take 1 tablet (25 mg total) by mouth daily. 03/19/18   Gerlene Fee, NP  torsemide (DEMADEX) 20 MG tablet Take 1 tablet (20 mg total) by mouth daily as needed. 12/21/20   Enzo Bi, MD  Wound Dressings (ALLEVYN GENTLE BORDER EX) Apply 1 patch topically every 3 (three) days. Apply to left lower extremity every 3 days cleanse with saline; apply skin prep to surrounding skin and apply patch every 3 days.     [provider]    Allergies Other, Amlodipine, Ciprofloxacin, Doxycycline, Lipitor [atorvastatin], Morphine and related, Macrobid [nitrofurantoin macrocrystal], Penicillins, and Sulfa antibiotics  Family History  Problem Relation Age of Onset   Heart attack Father     Coronary artery disease Sister 63       MI   Breast cancer Maternal Aunt     Social History Social History   Tobacco Use   Smoking status: Never   Smokeless tobacco:  Never  Vaping Use   Vaping Use: Never used  Substance Use Topics   Alcohol use: No   Drug use: No    Review of Systems  Review of Systems  Constitutional:  Negative for chills and fever.  HENT:  Negative for sore throat.   Eyes:  Negative for pain.  Respiratory:  Negative for cough and stridor.   Cardiovascular:  Negative for chest pain.  Gastrointestinal:  Negative for vomiting.  Genitourinary:  Negative for dysuria.  Musculoskeletal:  Positive for falls and joint pain (L elbow, L hip).  Skin:  Negative for rash.  Neurological:  Negative for seizures, loss of consciousness and headaches.  Psychiatric/Behavioral:  Negative for suicidal ideas.   All other systems reviewed and are negative.    ____________________________________________   PHYSICAL EXAM:  VITAL SIGNS: ED Triage Vitals [01/17/21 1842]  Enc Vitals Group     BP (!) 192/111     Pulse Rate 94     Resp      Temp 97.6 F (36.4 C)     Temp Source Oral     SpO2 93 %     Weight      Height      Head Circumference      Peak Flow      Pain Score      Pain Loc      Pain Edu?      Excl. in [place]?    Vitals:   01/17/21 1842 01/17/21 1950  BP: (!) 192/111 (!) 162/111  Pulse: 94 86  Resp:  15  Temp: 97.6 F (36.4 C) 97.6 F (36.4 C)  SpO2: 93% 100%   Physical Exam Vitals and nursing note reviewed.  Constitutional:      General: She is not in acute distress.    Appearance: She is well-developed.  HENT:     Head: Normocephalic and atraumatic.     Right Ear: External ear normal.     Left Ear: External ear normal.     Nose: Nose normal.  Eyes:     Conjunctiva/sclera: Conjunctivae normal.  Cardiovascular:     Rate and Rhythm: Normal rate and regular rhythm.     Heart sounds: No murmur heard. Pulmonary:     Effort: Pulmonary  effort is normal. No respiratory distress.     Breath sounds: Normal breath sounds.  Abdominal:     Palpations: Abdomen is soft.     Tenderness: There is no abdominal tenderness.  Musculoskeletal:     Cervical back: Neck supple.  Skin:    General: Skin is warm and dry.     Capillary Refill: Capillary refill takes less than 2 seconds.  Neurological:     Mental Status: She is alert and oriented to person, place, and time.  Psychiatric:        Mood and Affect: Mood normal.    No tenderness step-offs deformities over the C/T/L-spine.  Cranial nerves II through XII are grossly intact.  Patient has full strength on her right upper extremity and right lower extremity.  .  She has symmetric grip strength in left hand compared to the right.  Sensation is intact in the left hand and distribution of the radial ulnar and median nerves.  2+ radial pulse.  Sensation is intact throughout the bilateral lower extremities.  2+ DP pulses.  Patient is able to range her left ankle and foot pain seems mostly isolated left hip.  Skin tear over the posterior half of the left  elbow without any visible bone. ____________________________________________   LABS (all labs ordered are listed, but only abnormal results are displayed)  Labs Reviewed  RESP PANEL BY RT-PCR (FLU A&B, COVID) ARPGX2  BASIC METABOLIC PANEL  CBC WITH DIFFERENTIAL/PLATELET  PROTIME-INR  TYPE AND SCREEN   ____________________________________________  EKG  A. fib with a rate of 94, QTc interval of 570 and some nonspecific changes throughout.  Right bundle branch block.  ST depressions in V2 and V3 as well as in lead II space appear and prior EKG on 9/6. ____________________________________________  RADIOLOGY  ED MD interpretation: CT head shows no evidence of acute skull fracture, intracranial hemorrhage or other acute intracranial process.  CT C-spine shows no acute traumatic listhesis or acute injury of the C-spine.  There is  multiple cervical spondylolysis.  Plain film the left elbow shows an acute transverse fracture of the olecranon with proximal retraction and rotation of the proximal fragment.  There is also spurring of the coronoid process and a joint effusion.   Plain film left femur shows acute mildly impacted subcapital fracture of the left femoral neck.  This is also seen on pelvis x-ray.  No other acute pelvic injuries noted.  Chest X-ray shows left-sided pleural effusion without any other clear acute intrathoracic process including evidence of rib fracture, pneumothorax, focal consolidation or pulm edema.   Official radiology report(s): DG Pelvis 1-2 Views  Result Date: 01/17/2021 CLINICAL DATA:  Left upper leg pain after fall EXAM: PELVIS - 1-2 VIEW; LEFT FEMUR 2 VIEWS COMPARISON:  None. FINDINGS: Acute mildly impacted subcapital fracture of the left femoral neck. No significant angulation. No dislocation. Remainder of the left femur is intact. Prior left total knee arthroplasty without complication. Mild diffuse bone demineralization. Advanced lower lumbar spondylosis status post prior L3 vertebral body cement augmentation. Vascular calcifications are present. IMPRESSION: Acute mildly impacted subcapital fracture of the left femoral neck. Electronically Signed   By: Davina Poke D.O.   On: 01/17/2021 18:06   DG Elbow 2 Views Left  Result Date: 01/17/2021 CLINICAL DATA:  Fall, elbow pain, difficulty straightening. EXAM: LEFT ELBOW - 2 VIEW COMPARISON:  None. FINDINGS: Acute transverse fracture of the olecranon with proximal retraction and substantial rotation of the proximal fragment, leading to about 1.3 cm gap at the fracture site. Elbow joint effusion observed with anterior sail sign. Suboptimal frontal projection due to required flexion of the elbow as the patient is unable to extend. Spurring of the coronoid process of the ulna is slightly irregular but not definitively fractured. IMPRESSION: 1.  Acute transverse fracture of the olecranon with proximal retraction rotation of the proximal fragment. 2. Spurring of the coronoid process. 3. Elbow joint effusion. Electronically Signed   By: Van Clines M.D.   On: 01/17/2021 18:07   CT HEAD WO CONTRAST (5MM)  Result Date: 01/17/2021 CLINICAL DATA:  Fall, posterior head trauma.  Patient on Eliquis EXAM: CT HEAD WITHOUT CONTRAST CT CERVICAL SPINE WITHOUT CONTRAST TECHNIQUE: Multidetector CT imaging of the head and cervical spine was performed following the standard protocol without intravenous contrast. Multiplanar CT image reconstructions of the cervical spine were also generated. COMPARISON:  12/20/2020 FINDINGS: CT HEAD FINDINGS Brain: No evidence of acute infarction, hemorrhage, hydrocephalus, extra-axial collection or mass lesion/mass effect. Remote lacunar infarct in the right basal ganglia. Moderate low-density changes within the periventricular and subcortical white matter compatible with chronic microvascular ischemic change. Mild diffuse cerebral volume loss. Vascular: Atherosclerotic calcifications involving the large vessels of the skull base. No unexpected  hyperdense vessel. Skull: Normal. Negative for fracture or focal lesion. Sinuses/Orbits: No acute finding. Other: Negative for scalp hematoma. CT CERVICAL SPINE FINDINGS Alignment: Facet joints are aligned without dislocation or traumatic listhesis. Dens and lateral masses are aligned. Degenerative facet-mediated grade 1 anterolisthesis of C3 on C4 and C4 on C5 are unchanged. Skull base and vertebrae: No acute fracture. No primary bone lesion or focal pathologic process. Soft tissues and spinal canal: No prevertebral fluid or swelling. No visible canal hematoma. Disc levels: Similar degree of advanced multilevel facet arthropathy and degenerative disc disease within the cervical spine. Upper chest: Layering left-sided pleural effusion. Other: Bilateral carotid atherosclerosis. IMPRESSION:  1. No acute intracranial findings. 2. No evidence of acute fracture or traumatic listhesis of the cervical spine. 3. Layering left-sided pleural effusion. Recommend dedicated chest radiographs for further evaluation. 4. Multilevel cervical spondylosis. Electronically Signed   By: Davina Poke D.O.   On: 01/17/2021 18:53   CT CERVICAL SPINE WO CONTRAST  Result Date: 01/17/2021 CLINICAL DATA:  Fall, posterior head trauma.  Patient on Eliquis EXAM: CT HEAD WITHOUT CONTRAST CT CERVICAL SPINE WITHOUT CONTRAST TECHNIQUE: Multidetector CT imaging of the head and cervical spine was performed following the standard protocol without intravenous contrast. Multiplanar CT image reconstructions of the cervical spine were also generated. COMPARISON:  12/20/2020 FINDINGS: CT HEAD FINDINGS Brain: No evidence of acute infarction, hemorrhage, hydrocephalus, extra-axial collection or mass lesion/mass effect. Remote lacunar infarct in the right basal ganglia. Moderate low-density changes within the periventricular and subcortical white matter compatible with chronic microvascular ischemic change. Mild diffuse cerebral volume loss. Vascular: Atherosclerotic calcifications involving the large vessels of the skull base. No unexpected hyperdense vessel. Skull: Normal. Negative for fracture or focal lesion. Sinuses/Orbits: No acute finding. Other: Negative for scalp hematoma. CT CERVICAL SPINE FINDINGS Alignment: Facet joints are aligned without dislocation or traumatic listhesis. Dens and lateral masses are aligned. Degenerative facet-mediated grade 1 anterolisthesis of C3 on C4 and C4 on C5 are unchanged. Skull base and vertebrae: No acute fracture. No primary bone lesion or focal pathologic process. Soft tissues and spinal canal: No prevertebral fluid or swelling. No visible canal hematoma. Disc levels: Similar degree of advanced multilevel facet arthropathy and degenerative disc disease within the cervical spine. Upper chest:  Layering left-sided pleural effusion. Other: Bilateral carotid atherosclerosis. IMPRESSION: 1. No acute intracranial findings. 2. No evidence of acute fracture or traumatic listhesis of the cervical spine. 3. Layering left-sided pleural effusion. Recommend dedicated chest radiographs for further evaluation. 4. Multilevel cervical spondylosis. Electronically Signed   By: Davina Poke D.O.   On: 01/17/2021 18:53   DG Chest Portable 1 View  Result Date: 01/17/2021 CLINICAL DATA:  Fall, hip fracture EXAM: PORTABLE CHEST 1 VIEW COMPARISON:  CT chest 04/22/2014,  CT abdomen 08/31/2020. FINDINGS: Normal cardiac silhouette. There is a LEFT pleural effusion. No rib fracture or pneumothorax identified. There is underlying interstitial edema pattern. No pneumothorax. No RIGHT effusion IMPRESSION: Unilateral LEFT pleural effusion. No rib fracture or pneumothorax is identified. Electronically Signed   By: Suzy Bouchard M.D.   On: 01/17/2021 20:04   DG Femur Min 2 Views Left  Result Date: 01/17/2021 CLINICAL DATA:  Left upper leg pain after fall EXAM: PELVIS - 1-2 VIEW; LEFT FEMUR 2 VIEWS COMPARISON:  None. FINDINGS: Acute mildly impacted subcapital fracture of the left femoral neck. No significant angulation. No dislocation. Remainder of the left femur is intact. Prior left total knee arthroplasty without complication. Mild diffuse bone demineralization. Advanced lower lumbar spondylosis  status post prior L3 vertebral body cement augmentation. Vascular calcifications are present. IMPRESSION: Acute mildly impacted subcapital fracture of the left femoral neck. Electronically Signed   By: Davina Poke D.O.   On: 01/17/2021 18:06    ____________________________________________   PROCEDURES  Procedure(s) performed (including Critical Care):  .1-3 Lead EKG Interpretation Performed by: Lucrezia Starch, MD Authorized by: Lucrezia Starch, MD     Interpretation: non-specific     ECG rate assessment:  normal     Rhythm: atrial fibrillation     Ectopy: none     ____________________________________________   INITIAL IMPRESSION / ASSESSMENT AND PLAN / ED COURSE      Patient presents with above to history exam for assessment of left elbow and left hip pain after ground-level fall described above.  On arrival she is hypertensive with a BP of 192/111 with otherwise stable vital signs.  SPO2 is slightly borderline at 93% and I did place patient on 2 L nasal cannula after ordering some fentanyl for her as she was intermittently dipping 90%.  On exam patient does have a skin tear at the left elbow there is no effusion and patient has pain and decreased range of motion.  In addition she cannot move his left hip.  She is otherwise neurovascular intact in her extremities without other obvious evidence of acute trauma to the chest abdomen torso or face.  Bruising from prior fall seen on the right face.   CT head shows no evidence of acute skull fracture, intracranial hemorrhage or other acute intracranial process.  CT C-spine shows no acute traumatic listhesis or acute injury of the C-spine.  There is multiple cervical spondylolysis.  Plain film the left elbow shows an acute transverse fracture of the olecranon with proximal retraction and rotation of the proximal fragment.  There is also spurring of the coronoid process and a joint effusion.  Plain film left femur shows acute mildly impacted subcapital fracture of the left femoral neck.  This is also seen on pelvis x-ray.  No other acute pelvic injuries noted.  Chest X-ray shows left-sided pleural effusion without any other clear acute intrathoracic process including evidence of rib fracture, pneumothorax, focal consolidation or pulm edema.  CBC shows WBC count of 19.5 without evidence of acute anemia and normal platelets.  Suspect possible reactive in the setting of trauma as patient denies any infectious symptoms and there are no SSI infection on  exam.  MP shows no significant electrolyte or metabolic derangements.  Discussed above noted fractures with on-call orthopedist Dr. Harlow Mares who recommended posterior back type splint on the left elbow and admission to hospital service.  Splint ordered and placed emergency room.  Patient will be admitted to hospital service for further evaluation and management.          ____________________________________________   FINAL CLINICAL IMPRESSION(S) / ED DIAGNOSES  Final diagnoses:  Fall  Closed fracture of left elbow, initial encounter  Closed fracture of left hip, initial encounter ([place])  Skin tear of left elbow without complication, initial encounter    Medications  fentaNYL (SUBLIMAZE) injection 50 mcg (has no administration in time range)  ondansetron (ZOFRAN-ODT) disintegrating tablet 4 mg (4 mg Oral Given 01/17/21 1844)     ED Discharge Orders     None        Note:  This document was prepared using Dragon voice recognition software and may include unintentional dictation errors.    Lucrezia Starch, MD 01/17/21 2112

## 2021-01-18 DIAGNOSIS — S72002A Fracture of unspecified part of neck of left femur, initial encounter for closed fracture: Secondary | ICD-10-CM | POA: Diagnosis not present

## 2021-01-18 DIAGNOSIS — I5032 Chronic diastolic (congestive) heart failure: Secondary | ICD-10-CM

## 2021-01-18 DIAGNOSIS — W19XXXA Unspecified fall, initial encounter: Secondary | ICD-10-CM | POA: Diagnosis not present

## 2021-01-18 LAB — URINALYSIS, COMPLETE (UACMP) WITH MICROSCOPIC
Bacteria, UA: NONE SEEN
Bilirubin Urine: NEGATIVE
Glucose, UA: NEGATIVE mg/dL
Hgb urine dipstick: NEGATIVE
Ketones, ur: NEGATIVE mg/dL
Leukocytes,Ua: NEGATIVE
Nitrite: NEGATIVE
Protein, ur: 100 mg/dL — AB
Specific Gravity, Urine: 1.011 (ref 1.005–1.030)
Squamous Epithelial / HPF: NONE SEEN (ref 0–5)
pH: 6 (ref 5.0–8.0)

## 2021-01-18 LAB — FERRITIN: Ferritin: 101 ng/mL (ref 11–307)

## 2021-01-18 LAB — VITAMIN B12: Vitamin B-12: 478 pg/mL (ref 180–914)

## 2021-01-18 LAB — IRON AND TIBC
Iron: 49 ug/dL (ref 28–170)
Saturation Ratios: 14 % (ref 10.4–31.8)
TIBC: 343 ug/dL (ref 250–450)
UIBC: 294 ug/dL

## 2021-01-18 LAB — FOLATE: Folate: 12.6 ng/mL (ref >5.9)

## 2021-01-18 LAB — RETICULOCYTES
Immature Retic Fract: 19.1 % — ABNORMAL HIGH (ref 2.3–15.9)
RBC.: 3.99 MIL/uL (ref 3.87–5.11)
Retic Count, Absolute: 112.9 10*3/uL (ref 19.0–186.0)
Retic Ct Pct: 2.8 % (ref 0.4–3.1)

## 2021-01-18 MED ORDER — ENSURE ENLIVE PO LIQD
237.0000 mL | Freq: Two times a day (BID) | ORAL | Status: DC
  Administered 2021-01-18 – 2021-01-24 (×8): 237 mL via ORAL

## 2021-01-18 MED ORDER — SODIUM CHLORIDE 0.9 % IV SOLN: INTRAVENOUS | Status: DC

## 2021-01-18 MED ORDER — SODIUM CHLORIDE 0.9 % IV BOLUS
250.0000 mL | Freq: Once | INTRAVENOUS | Status: AC
  Administered 2021-01-18: 250 mL via INTRAVENOUS

## 2021-01-18 MED ORDER — CLINDAMYCIN PHOSPHATE 900 MG/50ML IV SOLN: 900.0000 mg | INTRAVENOUS | Status: DC

## 2021-01-18 NOTE — Progress Notes (Signed)
Obtained clean catch urine sample put in by MD, while obtaining sample straight cathed 1100 of clear urine.

## 2021-01-18 NOTE — Progress Notes (Addendum)
PROGRESS NOTE    [name]   TDD:[id number]  DOB: [birth year]  DOA: 01/17/2021 PCP: Rusty Aus, MD   Brief Narrative:  [name] is an 87 year old female with mild Alzheimer's dementia who lives in independent living, paroxysmal atrial fibrillation, chronic kidney disease, hypertension who presents to the hospital after a fall in the bathroom at [facility]. She is found to have fracture of her left humerus and left hip.    Subjective: Not in pain as long as she is not moved.  The daughter notes that she is acutely confused today.    Assessment & Plan:   Principal Problem:   Fall   Closed left hip fracture (HCC)   Left elbow fracture -Orthopedic surgery, Dr. Harlow Mares has been consulted and will be assisting with management - Left arm is in a sling The patient is not in pain as long as she is not moving  Active Problems: Acute onset confusion - Of note the patient does take Aricept at baseline and so she likely has some mild dementia although she lives in assisted living - UA reveals that there is no UTI - Continue to follow for delirium  Leukocytosis - no infection noted- possibly acute stress response    PAF (paroxysmal atrial fibrillation) ([place]) -Per, the patient stopped taking Eliquis sometime ago when she was found to have a low hemoglobin and Hemoccult positive stools - Continue amiodarone and Toprol  Diarrhea - Recently placed on budesonide by GI-daughter does not know what the exact diagnosis is-the patient is yet to have a colonoscopy - As mentioned above, she was recently found to be anemic with a hemoglobin of 6 and found to have heme positive stool    Hypertension   Chronic diastolic congestive heart failure  -There is to be dehydrated-holding spironolactone and torsemide  Hypothyroidism - Continue levothyroxine   Time spent in minutes: 35 DVT prophylaxis: SCDs Start: 01/17/21 2031 Code Status: Full code Family Communication: Daughter at  bedside Level of Care: Level of care: Med-Surg Disposition Plan:  Status is: Inpatient  Remains inpatient appropriate because:IV treatments appropriate due to intensity of illness or inability to take PO  Dispo: The patient is from: Home              Anticipated d/c is to: Home              Patient currently is not medically stable to d/c.   Difficult to place patient No      Consultants:  Orthopedic surgery Procedures:   Antimicrobials:  Anti-infectives (From admission, onward)    Start     Dose/Rate Route Frequency Ordered Stop   01/18/21 1344  clindamycin (CLEOCIN) IVPB 900 mg        900 mg 100 mL/hr over 30 Minutes Intravenous 30 min pre-op 01/18/21 1344          Objective: Vitals:   01/18/21 0628 01/18/21 0744 01/18/21 1133 01/18/21 1519  BP: (!) 161/85 (!) 150/81 (!) 159/86 (!) 150/84  Pulse: 92 86 94 90  Resp: 17 16 16 16   Temp: 98.1 F (36.7 C) 98.3 F (36.8 C) 98.5 F (36.9 C) 97.9 F (36.6 C)  TempSrc:   Oral   SpO2: 94% 94% 98% 97%  Height:        Intake/Output Summary (Last 24 hours) at 01/18/2021 1700 Last data filed at 01/18/2021 1500 Gross per 24 hour  Intake 500 ml  Output --  Net 500 ml   There were  no vitals filed for this visit.  Examination: General exam: Appears comfortable  HEENT: PERRLA, oral mucosa moist, no sclera icterus or thrush Respiratory system: Clear to auscultation. Respiratory effort normal. Cardiovascular system: S1 & S2 heard, RRR.   Gastrointestinal system: Abdomen soft, non-tender, nondistended. Normal bowel sounds. Central nervous system: Alert and oriented only to person. No focal neurological deficits. Extremities: No cyanosis, clubbing or edema-left arm in a sling Skin: No rashes or ulcers Psychiatry:  Mood & affect appropriate.     Data Reviewed: I have personally reviewed following labs and imaging studies  CBC: Recent Labs  Lab 01/17/21 2016  WBC 19.5*  NEUTROABS 15.6*  HGB 13.6  HCT 41.5  MCV  106.7*  PLT 741   Basic Metabolic Panel: Recent Labs  Lab 01/17/21 2016  NA 140  K 3.7  CL 103  CO2 27  GLUCOSE 122*  BUN 20  CREATININE 1.26*  CALCIUM 8.9   GFR: CrCl cannot be calculated (Unknown ideal weight.). Liver Function Tests: No results for input(s): AST, ALT, ALKPHOS, BILITOT, PROT, ALBUMIN in the last 168 hours. No results for input(s): LIPASE, AMYLASE in the last 168 hours. No results for input(s): AMMONIA in the last 168 hours. Coagulation Profile: Recent Labs  Lab 01/17/21 2016  INR 1.0   Cardiac Enzymes: No results for input(s): CKTOTAL, CKMB, CKMBINDEX, TROPONINI in the last 168 hours. BNP (last 3 results) No results for input(s): PROBNP in the last 8760 hours. HbA1C: No results for input(s): HGBA1C in the last 72 hours. CBG: No results for input(s): GLUCAP in the last 168 hours. Lipid Profile: No results for input(s): CHOL, HDL, LDLCALC, TRIG, CHOLHDL, LDLDIRECT in the last 72 hours. Thyroid Function Tests: No results for input(s): TSH, T4TOTAL, FREET4, T3FREE, THYROIDAB in the last 72 hours. Anemia Panel: Recent Labs    01/18/21 1119  VITAMINB12 478  FOLATE 12.6  FERRITIN 101  TIBC 343  IRON 49  RETICCTPCT 2.8   Urine analysis:    Component Value Date/Time   COLORURINE YELLOW (A) 01/18/2021 1200   APPEARANCEUR CLEAR (A) 01/18/2021 1200   APPEARANCEUR Clear 08/29/2013 1027   LABSPEC 1.011 01/18/2021 1200   LABSPEC 1.005 08/29/2013 1027   PHURINE 6.0 01/18/2021 1200   GLUCOSEU NEGATIVE 01/18/2021 1200   GLUCOSEU Negative 08/29/2013 1027   HGBUR NEGATIVE 01/18/2021 1200   BILIRUBINUR NEGATIVE 01/18/2021 1200   BILIRUBINUR Negative 08/29/2013 1027   KETONESUR NEGATIVE 01/18/2021 1200   PROTEINUR 100 (A) 01/18/2021 1200   NITRITE NEGATIVE 01/18/2021 1200   LEUKOCYTESUR NEGATIVE 01/18/2021 1200   LEUKOCYTESUR Negative 08/29/2013 1027   Sepsis Labs: @LABRCNTIP (procalcitonin:4,lacticidven:4) ) Recent Results (from the past 240  hour(s))  Resp Panel by RT-PCR (Flu A&B, Covid) Nasopharyngeal Swab     Status: None   Collection Time: 01/17/21  8:16 PM   Specimen: Nasopharyngeal Swab; Nasopharyngeal(NP) swabs in vial transport medium  Result Value Ref Range Status   SARS Coronavirus 2 by RT PCR NEGATIVE NEGATIVE Final    Comment: (NOTE) SARS-CoV-2 target nucleic acids are NOT DETECTED.  The SARS-CoV-2 RNA is generally detectable in upper respiratory specimens during the acute phase of infection. The lowest concentration of SARS-CoV-2 viral copies this assay can detect is 138 copies/mL. A negative result does not preclude SARS-Cov-2 infection and should not be used as the sole basis for treatment or other patient management decisions. A negative result may occur with  improper specimen collection/handling, submission of specimen other than nasopharyngeal swab, presence of viral mutation(s) within the areas  targeted by this assay, and inadequate number of viral copies(<138 copies/mL). A negative result must be combined with clinical observations, patient history, and epidemiological information. The expected result is Negative.  Fact Sheet for Patients:  EntrepreneurPulse.com.au  Fact Sheet for Healthcare Providers:  IncredibleEmployment.be  This test is no t yet approved or cleared by the [place] FDA and  has been authorized for detection and/or diagnosis of SARS-CoV-2 by FDA under an Emergency Use Authorization (EUA). This EUA will remain  in effect (meaning this test can be used) for the duration of the COVID-19 declaration under Section 564(b)(1) of the Act, 21 U.S.C.section 360bbb-3(b)(1), unless the authorization is terminated  or revoked sooner.       Influenza A by PCR NEGATIVE NEGATIVE Final   Influenza B by PCR NEGATIVE NEGATIVE Final    Comment: (NOTE) The Xpert Xpress SARS-CoV-2/FLU/RSV plus assay is intended as an aid in the diagnosis of influenza from  Nasopharyngeal swab specimens and should not be used as a sole basis for treatment. Nasal washings and aspirates are unacceptable for Xpert Xpress SARS-CoV-2/FLU/RSV testing.  Fact Sheet for Patients: EntrepreneurPulse.com.au  Fact Sheet for Healthcare Providers: IncredibleEmployment.be  This test is not yet approved or cleared by the [place] FDA and has been authorized for detection and/or diagnosis of SARS-CoV-2 by FDA under an Emergency Use Authorization (EUA). This EUA will remain in effect (meaning this test can be used) for the duration of the COVID-19 declaration under Section 564(b)(1) of the Act, 21 U.S.C. section 360bbb-3(b)(1), unless the authorization is terminated or revoked.  Performed at [facility], [address] [place], [place] [address]          Radiology Studies: DG Pelvis 1-2 Views  Result Date: 01/17/2021 CLINICAL DATA:  Left upper leg pain after fall EXAM: PELVIS - 1-2 VIEW; LEFT FEMUR 2 VIEWS COMPARISON:  None. FINDINGS: Acute mildly impacted subcapital fracture of the left femoral neck. No significant angulation. No dislocation. Remainder of the left femur is intact. Prior left total knee arthroplasty without complication. Mild diffuse bone demineralization. Advanced lower lumbar spondylosis status post prior L3 vertebral body cement augmentation. Vascular calcifications are present. IMPRESSION: Acute mildly impacted subcapital fracture of the left femoral neck. Electronically Signed   By: Davina Poke D.O.   On: 01/17/2021 18:06   DG Elbow 2 Views Left  Result Date: 01/17/2021 CLINICAL DATA:  Fall, elbow pain, difficulty straightening. EXAM: LEFT ELBOW - 2 VIEW COMPARISON:  None. FINDINGS: Acute transverse fracture of the olecranon with proximal retraction and substantial rotation of the proximal fragment, leading to about 1.3 cm gap at the fracture site. Elbow joint effusion observed with anterior  sail sign. Suboptimal frontal projection due to required flexion of the elbow as the patient is unable to extend. Spurring of the coronoid process of the ulna is slightly irregular but not definitively fractured. IMPRESSION: 1. Acute transverse fracture of the olecranon with proximal retraction rotation of the proximal fragment. 2. Spurring of the coronoid process. 3. Elbow joint effusion. Electronically Signed   By: Van Clines M.D.   On: 01/17/2021 18:07   CT HEAD WO CONTRAST (5MM)  Result Date: 01/17/2021 CLINICAL DATA:  Fall, posterior head trauma.  Patient on Eliquis EXAM: CT HEAD WITHOUT CONTRAST CT CERVICAL SPINE WITHOUT CONTRAST TECHNIQUE: Multidetector CT imaging of the head and cervical spine was performed following the standard protocol without intravenous contrast. Multiplanar CT image reconstructions of the cervical spine were also generated. COMPARISON:  12/20/2020 FINDINGS: CT HEAD  FINDINGS Brain: No evidence of acute infarction, hemorrhage, hydrocephalus, extra-axial collection or mass lesion/mass effect. Remote lacunar infarct in the right basal ganglia. Moderate low-density changes within the periventricular and subcortical white matter compatible with chronic microvascular ischemic change. Mild diffuse cerebral volume loss. Vascular: Atherosclerotic calcifications involving the large vessels of the skull base. No unexpected hyperdense vessel. Skull: Normal. Negative for fracture or focal lesion. Sinuses/Orbits: No acute finding. Other: Negative for scalp hematoma. CT CERVICAL SPINE FINDINGS Alignment: Facet joints are aligned without dislocation or traumatic listhesis. Dens and lateral masses are aligned. Degenerative facet-mediated grade 1 anterolisthesis of C3 on C4 and C4 on C5 are unchanged. Skull base and vertebrae: No acute fracture. No primary bone lesion or focal pathologic process. Soft tissues and spinal canal: No prevertebral fluid or swelling. No visible canal hematoma. Disc  levels: Similar degree of advanced multilevel facet arthropathy and degenerative disc disease within the cervical spine. Upper chest: Layering left-sided pleural effusion. Other: Bilateral carotid atherosclerosis. IMPRESSION: 1. No acute intracranial findings. 2. No evidence of acute fracture or traumatic listhesis of the cervical spine. 3. Layering left-sided pleural effusion. Recommend dedicated chest radiographs for further evaluation. 4. Multilevel cervical spondylosis. Electronically Signed   By: Davina Poke D.O.   On: 01/17/2021 18:53   CT CERVICAL SPINE WO CONTRAST  Result Date: 01/17/2021 CLINICAL DATA:  Fall, posterior head trauma.  Patient on Eliquis EXAM: CT HEAD WITHOUT CONTRAST CT CERVICAL SPINE WITHOUT CONTRAST TECHNIQUE: Multidetector CT imaging of the head and cervical spine was performed following the standard protocol without intravenous contrast. Multiplanar CT image reconstructions of the cervical spine were also generated. COMPARISON:  12/20/2020 FINDINGS: CT HEAD FINDINGS Brain: No evidence of acute infarction, hemorrhage, hydrocephalus, extra-axial collection or mass lesion/mass effect. Remote lacunar infarct in the right basal ganglia. Moderate low-density changes within the periventricular and subcortical white matter compatible with chronic microvascular ischemic change. Mild diffuse cerebral volume loss. Vascular: Atherosclerotic calcifications involving the large vessels of the skull base. No unexpected hyperdense vessel. Skull: Normal. Negative for fracture or focal lesion. Sinuses/Orbits: No acute finding. Other: Negative for scalp hematoma. CT CERVICAL SPINE FINDINGS Alignment: Facet joints are aligned without dislocation or traumatic listhesis. Dens and lateral masses are aligned. Degenerative facet-mediated grade 1 anterolisthesis of C3 on C4 and C4 on C5 are unchanged. Skull base and vertebrae: No acute fracture. No primary bone lesion or focal pathologic process. Soft  tissues and spinal canal: No prevertebral fluid or swelling. No visible canal hematoma. Disc levels: Similar degree of advanced multilevel facet arthropathy and degenerative disc disease within the cervical spine. Upper chest: Layering left-sided pleural effusion. Other: Bilateral carotid atherosclerosis. IMPRESSION: 1. No acute intracranial findings. 2. No evidence of acute fracture or traumatic listhesis of the cervical spine. 3. Layering left-sided pleural effusion. Recommend dedicated chest radiographs for further evaluation. 4. Multilevel cervical spondylosis. Electronically Signed   By: Davina Poke D.O.   On: 01/17/2021 18:53   DG Chest Portable 1 View  Result Date: 01/17/2021 CLINICAL DATA:  Fall, hip fracture EXAM: PORTABLE CHEST 1 VIEW COMPARISON:  CT chest 04/22/2014,  CT abdomen 08/31/2020. FINDINGS: Normal cardiac silhouette. There is a LEFT pleural effusion. No rib fracture or pneumothorax identified. There is underlying interstitial edema pattern. No pneumothorax. No RIGHT effusion IMPRESSION: Unilateral LEFT pleural effusion. No rib fracture or pneumothorax is identified. Electronically Signed   By: Suzy Bouchard M.D.   On: 01/17/2021 20:04   DG Femur Min 2 Views Left  Result Date: 01/17/2021 CLINICAL DATA:  Left upper leg pain after fall EXAM: PELVIS - 1-2 VIEW; LEFT FEMUR 2 VIEWS COMPARISON:  None. FINDINGS: Acute mildly impacted subcapital fracture of the left femoral neck. No significant angulation. No dislocation. Remainder of the left femur is intact. Prior left total knee arthroplasty without complication. Mild diffuse bone demineralization. Advanced lower lumbar spondylosis status post prior L3 vertebral body cement augmentation. Vascular calcifications are present. IMPRESSION: Acute mildly impacted subcapital fracture of the left femoral neck. Electronically Signed   By: Davina Poke D.O.   On: 01/17/2021 18:06      Scheduled Meds:  amiodarone  200 mg Oral BID    feeding supplement  237 mL Oral BID BM   levothyroxine  112 mcg Oral QAC breakfast   metoprolol succinate  25 mg Oral QHS   metoprolol succinate  50 mg Oral Daily   pantoprazole (PROTONIX) IV  40 mg Intravenous Q24H   PARoxetine  20 mg Oral Daily   Continuous Infusions:  clindamycin (CLEOCIN) IV       LOS: 1 day      Debbe Odea, MD [facility] Pager: [email or web address] 01/18/2021, 5:00 PM

## 2021-01-18 NOTE — Plan of Care (Signed)
Received patient from ED alert and oriented. Patient with left arm in sling. Left leg swollen and painful (per patient). Patient settled into room, assessment completed. Will continue to monitor as per orders/plan of care.

## 2021-01-18 NOTE — Progress Notes (Signed)
Initial Nutrition Assessment  DOCUMENTATION CODES:  Severe malnutrition in context of social or environmental circumstances  INTERVENTION:  Liberalize diet to regular for advanced age and malnutrition Ensure Enlive po BID, each supplement provides 350 kcal and 20 grams of protein New measured weight  NUTRITION DIAGNOSIS:  Severe Malnutrition related to social / environmental circumstances (inadequate oral intake at baseline) as evidenced by severe fat depletion, severe muscle depletion.  GOAL:  Patient will meet greater than or equal to 90% of their needs  MONITOR:  PO intake, Supplement acceptance, Labs  REASON FOR ASSESSMENT:  Consult Hip fracture protocol  ASSESSMENT:  87 y.o. female with past medical history of COPD, CHF, CKD4, HTN, HDL, GERD, hypothyroidism, PAD, A. fib, and hx of breast cancer (s/p radiation and mastectomy) who presented to ED with left elbow and left hip pain after a fall earlier in the day.  Imaging in ED showed a hip and elbow fracture. Orthopedics plans for surgery 10/6 as pt is on anticoagulation at baseline.  Pt resting in bed at the time of assessment, family at bedside assists with nutrition hx. Pt does not answer any nutrition questions at this time. Family reports that normally pt has a good appetite and eats well. Daughter reports that she lives out of town and does not see pt daily, but does not think pt has lost a significant amount of weight recently. Reports that pt has always been thin. Drinks ensure on occasion but not daily. Discussed the importance of good oral intake after surgery and recommended daily nutrition supplements to aid in recovery. Reports that pt likes chocolate flavor.    Nutritionally Relevant Medications: Scheduled Meds:  levothyroxine  112 mcg Oral QAC breakfast   pantoprazole (PROTONIX) IV  40 mg Intravenous Q24H   spironolactone  25 mg Oral Daily   Labs Reviewed: Creatinine 1.26  NUTRITION - FOCUSED PHYSICAL  EXAM: Flowsheet Row Most Recent Value  Orbital Region Moderate depletion  Upper Arm Region Severe depletion  Thoracic and Lumbar Region Severe depletion  Buccal Region Moderate depletion  Temple Region Moderate depletion  Clavicle Bone Region Severe depletion  Clavicle and Acromion Bone Region Severe depletion  Scapular Bone Region Severe depletion  Dorsal Hand Moderate depletion  Patellar Region Mild depletion  Anterior Thigh Region Mild depletion  Posterior Calf Region Mild depletion  Edema (RD Assessment) Mild  Hair Reviewed  Eyes Reviewed  Mouth Reviewed  Skin Reviewed  Nails Reviewed   Diet Order:   Diet Order             Diet NPO time specified  Diet effective midnight           Diet regular Room service appropriate? Yes; Fluid consistency: Thin  Diet effective now                  EDUCATION NEEDS:  No education needs have been identified at this time  Skin:  Skin Assessment: Reviewed RN Assessment  Last BM:  10/3  Height:  Ht Readings from Last 1 Encounters:  01/18/21 5\' 8"  (1.727 m)   Weight:  Wt Readings from Last 1 Encounters:  12/24/20 59 kg    Ideal Body Weight:  63.6 kg  BMI:  Body mass index is 19.77 kg/m.  Estimated Nutritional Needs:  Kcal:  1500-1700 kcal/d Protein:  75-85 g/d Fluid:  1.5-1.8 L/d   Ranell Patrick, RD, LDN Clinical Dietitian Pager on Amion

## 2021-01-18 NOTE — Progress Notes (Signed)
Bladder scanned pt again 245ml in bladder when scanned MD aware.

## 2021-01-19 ENCOUNTER — Inpatient Hospital Stay: Payer: Medicare HMO | Admitting: Registered Nurse

## 2021-01-19 ENCOUNTER — Inpatient Hospital Stay: Payer: Medicare HMO

## 2021-01-19 ENCOUNTER — Encounter
Admission: EM | Disposition: A | Discharge status: Skilled Nursing Facility | Payer: Self-pay | Source: Home / Self Care | Attending: Internal Medicine

## 2021-01-19 DIAGNOSIS — S72002A Fracture of unspecified part of neck of left femur, initial encounter for closed fracture: Secondary | ICD-10-CM | POA: Diagnosis not present

## 2021-01-19 DIAGNOSIS — K219 Gastro-esophageal reflux disease without esophagitis: Secondary | ICD-10-CM

## 2021-01-19 DIAGNOSIS — S42402A Unspecified fracture of lower end of left humerus, initial encounter for closed fracture: Secondary | ICD-10-CM | POA: Diagnosis not present

## 2021-01-19 DIAGNOSIS — W19XXXA Unspecified fall, initial encounter: Secondary | ICD-10-CM | POA: Diagnosis not present

## 2021-01-19 HISTORY — PX: PERCUTANEOUS PINNING: SHX2209

## 2021-01-19 HISTORY — PX: HIP CLOSED REDUCTION: SHX983

## 2021-01-19 HISTORY — PX: ORIF ELBOW FRACTURE: SHX5031

## 2021-01-19 LAB — BASIC METABOLIC PANEL
Anion gap: 9 (ref 5–15)
BUN: 19 mg/dL (ref 8–23)
CO2: 26 mmol/L (ref 22–32)
Calcium: 8.4 mg/dL — ABNORMAL LOW (ref 8.9–10.3)
Chloride: 104 mmol/L (ref 98–111)
Creatinine, Ser: 1.42 mg/dL — ABNORMAL HIGH (ref 0.44–1.00)
GFR, Estimated: 36 mL/min — ABNORMAL LOW (ref >60)
Glucose, Bld: 106 mg/dL — ABNORMAL HIGH (ref 70–99)
Potassium: 3.9 mmol/L (ref 3.5–5.1)
Sodium: 139 mmol/L (ref 135–145)

## 2021-01-19 LAB — CBC
HCT: 39 % (ref 36.0–46.0)
Hemoglobin: 12.9 g/dL (ref 12.0–15.0)
MCH: 35.1 pg — ABNORMAL HIGH (ref 26.0–34.0)
MCHC: 33.1 g/dL (ref 30.0–36.0)
MCV: 106.3 fL — ABNORMAL HIGH (ref 80.0–100.0)
Platelets: 166 10*3/uL (ref 150–400)
RBC: 3.67 MIL/uL — ABNORMAL LOW (ref 3.87–5.11)
RDW: 16.3 % — ABNORMAL HIGH (ref 11.5–15.5)
WBC: 14.2 10*3/uL — ABNORMAL HIGH (ref 4.0–10.5)
nRBC: 0 % (ref 0.0–0.2)

## 2021-01-19 LAB — GLUCOSE, CAPILLARY: Glucose-Capillary: 120 mg/dL — ABNORMAL HIGH (ref 70–99)

## 2021-01-19 SURGERY — CLOSED REDUCTION, HIP
Anesthesia: General | Site: Hip | Laterality: Left

## 2021-01-19 MED ORDER — METOCLOPRAMIDE HCL 5 MG/ML IJ SOLN: 5.0000 mg | Freq: Three times a day (TID) | INTRAMUSCULAR | Status: DC | PRN

## 2021-01-19 MED ORDER — LIDOCAINE HCL (CARDIAC) PF 100 MG/5ML IV SOSY
PREFILLED_SYRINGE | INTRAVENOUS | Status: DC | PRN
  Administered 2021-01-19: 80 mg via INTRAVENOUS

## 2021-01-19 MED ORDER — IPRATROPIUM-ALBUTEROL 0.5-2.5 (3) MG/3ML IN SOLN: 3.0000 mL | RESPIRATORY_TRACT | Status: AC

## 2021-01-19 MED ORDER — PHENYLEPHRINE HCL (PRESSORS) 10 MG/ML IV SOLN
INTRAVENOUS | Status: DC | PRN
  Administered 2021-01-19 (×2): 100 ug via INTRAVENOUS

## 2021-01-19 MED ORDER — ROCURONIUM BROMIDE 100 MG/10ML IV SOLN
INTRAVENOUS | Status: DC | PRN
  Administered 2021-01-19: 40 mg via INTRAVENOUS
  Administered 2021-01-19: 20 mg via INTRAVENOUS

## 2021-01-19 MED ORDER — PROPOFOL 10 MG/ML IV BOLUS
INTRAVENOUS | Status: DC | PRN
  Administered 2021-01-19: 50 mg via INTRAVENOUS

## 2021-01-19 MED ORDER — ONDANSETRON HCL 4 MG PO TABS: 4.0000 mg | ORAL_TABLET | Freq: Four times a day (QID) | ORAL | Status: DC | PRN

## 2021-01-19 MED ORDER — VITAMIN D 25 MCG (1000 UNIT) PO TABS
5000.0000 [IU] | ORAL_TABLET | Freq: Every day | ORAL | Status: DC
  Administered 2021-01-20 – 2021-01-24 (×5): 5000 [IU] via ORAL
  Filled 2021-01-19 (×5): qty 5

## 2021-01-19 MED ORDER — SUGAMMADEX SODIUM 200 MG/2ML IV SOLN
INTRAVENOUS | Status: DC | PRN
  Administered 2021-01-19: 200 mg via INTRAVENOUS

## 2021-01-19 MED ORDER — CEFAZOLIN SODIUM-DEXTROSE 2-3 GM-%(50ML) IV SOLR
INTRAVENOUS | Status: DC | PRN
  Administered 2021-01-19: 2 g via INTRAVENOUS

## 2021-01-19 MED ORDER — SODIUM CHLORIDE 0.9 % IV SOLN
INTRAVENOUS | Status: DC | PRN
  Administered 2021-01-19: 25 ug/min via INTRAVENOUS

## 2021-01-19 MED ORDER — NEOMYCIN-POLYMYXIN B GU 40-200000 IR SOLN
Status: DC | PRN
  Administered 2021-01-19: 2 mL

## 2021-01-19 MED ORDER — CEFAZOLIN SODIUM-DEXTROSE 2-4 GM/100ML-% IV SOLN
INTRAVENOUS | Status: AC
  Filled 2021-01-19: qty 100

## 2021-01-19 MED ORDER — ADULT MULTIVITAMIN W/MINERALS CH
1.0000 | ORAL_TABLET | Freq: Every day | ORAL | Status: DC
  Administered 2021-01-20 – 2021-01-24 (×5): 1 via ORAL
  Filled 2021-01-19 (×5): qty 1

## 2021-01-19 MED ORDER — ENOXAPARIN SODIUM 30 MG/0.3ML IJ SOSY
30.0000 mg | PREFILLED_SYRINGE | INTRAMUSCULAR | Status: DC
  Administered 2021-01-20 – 2021-01-24 (×5): 30 mg via SUBCUTANEOUS
  Filled 2021-01-19 (×5): qty 0.3

## 2021-01-19 MED ORDER — METOPROLOL TARTRATE 5 MG/5ML IV SOLN
5.0000 mg | Freq: Once | INTRAVENOUS | Status: AC
  Administered 2021-01-19: 5 mg via INTRAVENOUS
  Filled 2021-01-19: qty 5

## 2021-01-19 MED ORDER — DEXAMETHASONE SODIUM PHOSPHATE 10 MG/ML IJ SOLN
INTRAMUSCULAR | Status: DC | PRN
  Administered 2021-01-19: 10 mg via INTRAVENOUS

## 2021-01-19 MED ORDER — IPRATROPIUM-ALBUTEROL 0.5-2.5 (3) MG/3ML IN SOLN
RESPIRATORY_TRACT | Status: AC
  Administered 2021-01-19: 3 mL via RESPIRATORY_TRACT
  Filled 2021-01-19: qty 3

## 2021-01-19 MED ORDER — CLINDAMYCIN PHOSPHATE 600 MG/50ML IV SOLN
600.0000 mg | Freq: Four times a day (QID) | INTRAVENOUS | Status: AC
  Administered 2021-01-20 (×3): 600 mg via INTRAVENOUS
  Filled 2021-01-19 (×3): qty 50

## 2021-01-19 MED ORDER — ONDANSETRON HCL 4 MG/2ML IJ SOLN: 4.0000 mg | Freq: Once | INTRAMUSCULAR | Status: DC | PRN

## 2021-01-19 MED ORDER — FAMOTIDINE 20 MG PO TABS
40.0000 mg | ORAL_TABLET | Freq: Every day | ORAL | Status: DC
  Administered 2021-01-20 – 2021-01-23 (×4): 40 mg via ORAL
  Filled 2021-01-19 (×4): qty 2

## 2021-01-19 MED ORDER — HYDRALAZINE HCL 20 MG/ML IJ SOLN
INTRAMUSCULAR | Status: AC
  Filled 2021-01-19: qty 1

## 2021-01-19 MED ORDER — BUPIVACAINE HCL (PF) 0.5 % IJ SOLN
INTRAMUSCULAR | Status: AC
  Filled 2021-01-19: qty 30

## 2021-01-19 MED ORDER — ACETAMINOPHEN 10 MG/ML IV SOLN: 1000.0000 mg | Freq: Once | INTRAVENOUS | Status: DC | PRN

## 2021-01-19 MED ORDER — ACETAMINOPHEN 10 MG/ML IV SOLN
INTRAVENOUS | Status: AC
  Filled 2021-01-19: qty 100

## 2021-01-19 MED ORDER — ACETAMINOPHEN 10 MG/ML IV SOLN
INTRAVENOUS | Status: DC | PRN
  Administered 2021-01-19: 1000 mg via INTRAVENOUS

## 2021-01-19 MED ORDER — CLINDAMYCIN PHOSPHATE 900 MG/50ML IV SOLN
INTRAVENOUS | Status: AC
  Filled 2021-01-19: qty 50

## 2021-01-19 MED ORDER — FENTANYL CITRATE (PF) 100 MCG/2ML IJ SOLN: 25.0000 ug | INTRAMUSCULAR | Status: DC | PRN

## 2021-01-19 MED ORDER — FENTANYL CITRATE (PF) 100 MCG/2ML IJ SOLN
INTRAMUSCULAR | Status: AC
  Filled 2021-01-19: qty 2

## 2021-01-19 MED ORDER — DOCUSATE SODIUM 100 MG PO CAPS
100.0000 mg | ORAL_CAPSULE | Freq: Two times a day (BID) | ORAL | Status: DC
  Administered 2021-01-20 – 2021-01-24 (×7): 100 mg via ORAL
  Filled 2021-01-19 (×9): qty 1

## 2021-01-19 MED ORDER — NEOMYCIN-POLYMYXIN B GU 40-200000 IR SOLN
Status: AC
  Filled 2021-01-19: qty 2

## 2021-01-19 MED ORDER — BUPIVACAINE HCL (PF) 0.5 % IJ SOLN
INTRAMUSCULAR | Status: DC | PRN
  Administered 2021-01-19: 10 mL

## 2021-01-19 MED ORDER — ONDANSETRON HCL 4 MG/2ML IJ SOLN
INTRAMUSCULAR | Status: DC | PRN
  Administered 2021-01-19: 4 mg via INTRAVENOUS

## 2021-01-19 MED ORDER — HYDRALAZINE HCL 20 MG/ML IJ SOLN
10.0000 mg | Freq: Once | INTRAMUSCULAR | Status: AC
  Administered 2021-01-19: 10 mg via INTRAVENOUS

## 2021-01-19 MED ORDER — CALCIUM CARBONATE-VITAMIN D 500-200 MG-UNIT PO TABS
1.0000 | ORAL_TABLET | Freq: Every day | ORAL | Status: DC
  Administered 2021-01-20 – 2021-01-24 (×5): 1 via ORAL
  Filled 2021-01-19 (×5): qty 1

## 2021-01-19 MED ORDER — CEFAZOLIN SODIUM-DEXTROSE 2-4 GM/100ML-% IV SOLN: 2.0000 g | Freq: Once | INTRAVENOUS | Status: DC

## 2021-01-19 MED ORDER — METOCLOPRAMIDE HCL 10 MG PO TABS: 5.0000 mg | ORAL_TABLET | Freq: Three times a day (TID) | ORAL | Status: DC | PRN

## 2021-01-19 MED ORDER — LACTATED RINGERS IV SOLN: INTRAVENOUS | Status: DC | PRN

## 2021-01-19 MED ORDER — FENTANYL CITRATE (PF) 100 MCG/2ML IJ SOLN
INTRAMUSCULAR | Status: DC | PRN
  Administered 2021-01-19 (×2): 25 ug via INTRAVENOUS
  Administered 2021-01-19: 50 ug via INTRAVENOUS

## 2021-01-19 MED ORDER — BUDESONIDE 3 MG PO CPEP
9.0000 mg | ORAL_CAPSULE | Freq: Every morning | ORAL | Status: DC
  Administered 2021-01-20 – 2021-01-24 (×5): 9 mg via ORAL
  Filled 2021-01-19 (×5): qty 3

## 2021-01-19 MED ORDER — 0.9 % SODIUM CHLORIDE (POUR BTL) OPTIME
TOPICAL | Status: DC | PRN
  Administered 2021-01-19: 200 mL

## 2021-01-19 MED ORDER — ONDANSETRON HCL 4 MG/2ML IJ SOLN: 4.0000 mg | Freq: Four times a day (QID) | INTRAMUSCULAR | Status: DC | PRN

## 2021-01-19 SURGICAL SUPPLY — 75 items
"k-wire .062 " IMPLANT
APL PRP STRL LF DISP 70% ISPRP (MISCELLANEOUS) ×9
BIT DRILL CANN LRG QC 5X300 (BIT) IMPLANT
BIT DRILL JC 5IN 1.5M 127 22FL (BIT) ×1 IMPLANT
BLADE SURG SZ10 CARB STEEL (BLADE) ×4 IMPLANT
BNDG CMPR STD VLCR NS LF 5.8X4 (GAUZE/BANDAGES/DRESSINGS) ×6
BNDG COHESIVE 4X5 TAN ST LF (GAUZE/BANDAGES/DRESSINGS) ×4 IMPLANT
BNDG COHESIVE 6X5 TAN ST LF (GAUZE/BANDAGES/DRESSINGS) ×8 IMPLANT
BNDG ELASTIC 4X5.8 VLCR NS LF (GAUZE/BANDAGES/DRESSINGS) ×8 IMPLANT
BRUSH SCRUB EZ  4% CHG (MISCELLANEOUS) ×8
BRUSH SCRUB EZ 4% CHG (MISCELLANEOUS) ×6 IMPLANT
CHLORAPREP W/TINT 26 (MISCELLANEOUS) ×6 IMPLANT
CUFF TOURN SGL QUICK 18X4 (TOURNIQUET CUFF) IMPLANT
CUFF TOURN SGL QUICK 24 (TOURNIQUET CUFF)
CUFF TRNQT CYL 24X4X16.5-23 (TOURNIQUET CUFF) IMPLANT
DRAPE 3/4 80X56 (DRAPES) ×4 IMPLANT
DRAPE FLUOR MINI C-ARM 54X84 (DRAPES) ×4 IMPLANT
DRAPE U-SHAPE 47X51 STRL (DRAPES) ×4 IMPLANT
DRSG AQUACEL AG 3.5X4 (GAUZE/BANDAGES/DRESSINGS) ×1 IMPLANT
ELECT CAUTERY BLADE 6.4 (BLADE) ×4 IMPLANT
ELECT REM PT RETURN 9FT ADLT (ELECTROSURGICAL) ×4
ELECTRODE REM PT RTRN 9FT ADLT (ELECTROSURGICAL) ×3 IMPLANT
GAUZE 4X4 16PLY ~~LOC~~+RFID DBL (SPONGE) ×3 IMPLANT
GAUZE SPONGE 4X4 12PLY STRL (GAUZE/BANDAGES/DRESSINGS) ×4 IMPLANT
GAUZE XEROFORM 1X8 LF (GAUZE/BANDAGES/DRESSINGS) ×5 IMPLANT
GLOVE SURG ORTHO LTX SZ8 (GLOVE) ×8 IMPLANT
GLOVE SURG UNDER LTX SZ8 (GLOVE) ×4 IMPLANT
GOWN STRL REUS W/ TWL LRG LVL3 (GOWN DISPOSABLE) ×3 IMPLANT
GOWN STRL REUS W/ TWL XL LVL3 (GOWN DISPOSABLE) ×3 IMPLANT
GOWN STRL REUS W/TWL LRG LVL3 (GOWN DISPOSABLE) ×4
GOWN STRL REUS W/TWL XL LVL3 (GOWN DISPOSABLE) ×4
GUIDEWIRE THREADED 2.8 (WIRE) ×3 IMPLANT
HOLDER FOLEY CATH W/STRAP (MISCELLANEOUS) IMPLANT
IV CATH ANGIO 14GX3.25 ORG (MISCELLANEOUS) IMPLANT
K-WIRE .62 DIA 4 LENGTH (WIRE) ×3 IMPLANT
KIT TURNOVER CYSTO (KITS) ×4 IMPLANT
KIT TURNOVER KIT A (KITS) ×4 IMPLANT
MANIFOLD NEPTUNE II (INSTRUMENTS) ×4 IMPLANT
MAT ABSORB  FLUID 56X50 GRAY (MISCELLANEOUS) ×4
MAT ABSORB FLUID 56X50 GRAY (MISCELLANEOUS) ×3 IMPLANT
NDL FILTER BLUNT 18X1 1/2 (NEEDLE) ×3 IMPLANT
NEEDLE FILTER BLUNT 18X 1/2SAF (NEEDLE) ×1
NEEDLE FILTER BLUNT 18X1 1/2 (NEEDLE) ×3 IMPLANT
NS IRRIG 1000ML POUR BTL (IV SOLUTION) ×4 IMPLANT
NS IRRIG 500ML POUR BTL (IV SOLUTION) ×4 IMPLANT
PACK EXTREMITY ARMC (MISCELLANEOUS) ×4 IMPLANT
PACK HIP COMPR (MISCELLANEOUS) ×4 IMPLANT
PAD ABD DERMACEA PRESS 5X9 (GAUZE/BANDAGES/DRESSINGS) ×8 IMPLANT
PAD ARMBOARD 7.5X6 YLW CONV (MISCELLANEOUS) ×4 IMPLANT
PAD CAST CTTN 4X4 STRL (SOFTGOODS) ×9 IMPLANT
PAD PREP 24X41 OB/GYN DISP (PERSONAL CARE ITEMS) ×4 IMPLANT
PADDING CAST BLEND 4X4 NS (MISCELLANEOUS) ×4 IMPLANT
PADDING CAST COTTON 4X4 STRL (SOFTGOODS) ×20
PILLOW ABDUCTION FOAM SM (MISCELLANEOUS) ×3 IMPLANT
SCREW 6.5MM CANN 32X85 (Screw) ×1 IMPLANT
SCREW CANN 16 THRD/85 6.5 (Screw) ×1 IMPLANT
SCREW CANN 6.5X75MM (Screw) ×1 IMPLANT
SPLINT CAST 1 STEP 4X30 (MISCELLANEOUS) ×4 IMPLANT
SPONGE T-LAP 18X18 ~~LOC~~+RFID (SPONGE) ×8 IMPLANT
STAPLER SKIN PROX 35W (STAPLE) ×4 IMPLANT
STOCKINETTE IMPERVIOUS 9X36 MD (GAUZE/BANDAGES/DRESSINGS) ×4 IMPLANT
STRIP CLOSURE SKIN 1/2X4 (GAUZE/BANDAGES/DRESSINGS) ×4 IMPLANT
SUT ETHILON 3-0 FS-10 30 BLK (SUTURE) ×4
SUT VIC AB 0 CT1 36 (SUTURE) ×4 IMPLANT
SUT VIC AB 0 CT2 27 (SUTURE) ×4 IMPLANT
SUT VIC AB 2-0 CT2 27 (SUTURE) ×5 IMPLANT
SUTURE EHLN 3-0 FS-10 30 BLK (SUTURE) ×3 IMPLANT
SYR 5ML LL (SYRINGE) ×4 IMPLANT
TRAY FOLEY MTR SLVR 16FR STAT (SET/KITS/TRAYS/PACK) IMPLANT
TUBING CONNECTING 10 (TUBING) ×1 IMPLANT
WATER STERILE IRR 500ML POUR (IV SOLUTION) ×4 IMPLANT
WIRE Z .028 SPADE TIP (WIRE) ×1 IMPLANT
WIRE Z .062 C-WIRE SPADE TIP (WIRE) ×2 IMPLANT
k wire 20 gauge ×1 IMPLANT
k-wire .062 ×2 IMPLANT

## 2021-01-19 NOTE — Anesthesia Procedure Notes (Signed)
Procedure Name: Intubation Date/Time: 01/19/2021 5:28 PM Performed by: Nelda Marseille, CRNA Pre-anesthesia Checklist: Patient identified, Patient being monitored, Timeout performed, Emergency Drugs available and Suction available Patient Re-evaluated:Patient Re-evaluated prior to induction Oxygen Delivery Method: Circle system utilized Preoxygenation: Pre-oxygenation with 100% oxygen Induction Type: IV induction Ventilation: Mask ventilation without difficulty Laryngoscope Size: Mac, 3 and McGraph Grade View: Grade I Tube type: Oral Tube size: 7.0 mm Number of attempts: 1 Airway Equipment and Method: Stylet Placement Confirmation: ETT inserted through vocal cords under direct vision, positive ETCO2 and breath sounds checked- equal and bilateral Secured at: 21 cm Tube secured with: Tape Dental Injury: Teeth and Oropharynx as per pre-operative assessment

## 2021-01-19 NOTE — Progress Notes (Signed)
BLadder scanned for 150cc

## 2021-01-19 NOTE — Progress Notes (Signed)
Moderate bloody drainage noted to ace wrap on L elbow. Dressing reinforced with abd pads x2 and ace wrap. Pt tolerated well. Will continue to monitor.

## 2021-01-19 NOTE — Progress Notes (Signed)
[name] at [place] NAME: [name]    MR#:  [id number]  DATE OF BIRTH:  28-Jul-1932  SUBJECTIVE:  resting quietly. Daughter [name] at bedside gives most of the history. Patient apparently had a mechanical fall at [place]. Found to have left hip fracture and elbow fracture. NPO for surgery.  REVIEW OF SYSTEMS:   Review of Systems  Unable to perform ROS: Mental status change  Tolerating Diet: npo Tolerating PT:   DRUG ALLERGIES:   Allergies  Allergen Reactions   Other Other (See Comments)    NO BP, VENIPUNCTURE, OR ACCESS IN RIGHT ARM - S/P MASTECTOMY ON RIGHT   Amlodipine Nausea And Vomiting   Ciprofloxacin Nausea Only   Doxycycline Itching and Other (See Comments)    shaky   Lipitor [Atorvastatin] Other (See Comments)    Joint pain    Morphine And Related Nausea And Vomiting   Macrobid [Nitrofurantoin Macrocrystal] Rash   Penicillins Rash and Other (See Comments)    Has patient had a PCN reaction causing immediate rash, facial/tongue/throat swelling, SOB or lightheadedness with hypotension: yes Has patient had a PCN reaction causing severe rash involving mucus membranes or skin necrosis: no Has patient had a PCN reaction that required hospitalization no Has patient had a PCN reaction occurring within the last 10 years: no If all of the above answers are "NO", then may proceed with Cephalosporin use.    Sulfa Antibiotics Rash    VITALS:  Blood pressure (!) 153/81, pulse 98, temperature 98.3 F (36.8 C), temperature source Temporal, resp. rate 14, height 5\' 8"  (1.727 m), SpO2 97 %.  PHYSICAL EXAMINATION:   Physical Exam  GENERAL:  87 y.o.-year-old patient lying in the bed with no acute distress.  LUNGS: Normal breath sounds bilaterally, no wheezing, rales, rhonchi. No use of accessory muscles of respiration.  CARDIOVASCULAR: S1, S2 normal. No murmurs, rubs, or gallops.  ABDOMEN: Soft, nontender, nondistended. Bowel sounds  present. No organomegaly or mass.  EXTREMITIES: No cyanosis, clubbing or edema b/l.    NEUROLOGIC: grossly non focal   PSYCHIATRIC:  patient is sleepy SKIN: No obvious rash, lesion, or ulcer.   LABORATORY PANEL:  CBC Recent Labs  Lab 01/19/21 0317  WBC 14.2*  HGB 12.9  HCT 39.0  PLT 166    Chemistries  Recent Labs  Lab 01/19/21 0317  NA 139  K 3.9  CL 104  CO2 26  GLUCOSE 106*  BUN 19  CREATININE 1.42*  CALCIUM 8.4*   Cardiac Enzymes No results for input(s): TROPONINI in the last 168 hours. RADIOLOGY:  DG Pelvis 1-2 Views  Result Date: 01/17/2021 CLINICAL DATA:  Left upper leg pain after fall EXAM: PELVIS - 1-2 VIEW; LEFT FEMUR 2 VIEWS COMPARISON:  None. FINDINGS: Acute mildly impacted subcapital fracture of the left femoral neck. No significant angulation. No dislocation. Remainder of the left femur is intact. Prior left total knee arthroplasty without complication. Mild diffuse bone demineralization. Advanced lower lumbar spondylosis status post prior L3 vertebral body cement augmentation. Vascular calcifications are present. IMPRESSION: Acute mildly impacted subcapital fracture of the left femoral neck. Electronically Signed   By: Davina Poke D.O.   On: 01/17/2021 18:06   DG Elbow 2 Views Left  Result Date: 01/17/2021 CLINICAL DATA:  Fall, elbow pain, difficulty straightening. EXAM: LEFT ELBOW - 2 VIEW COMPARISON:  None. FINDINGS: Acute transverse fracture of the olecranon with proximal retraction and substantial rotation of the proximal fragment, leading to about  1.3 cm gap at the fracture site. Elbow joint effusion observed with anterior sail sign. Suboptimal frontal projection due to required flexion of the elbow as the patient is unable to extend. Spurring of the coronoid process of the ulna is slightly irregular but not definitively fractured. IMPRESSION: 1. Acute transverse fracture of the olecranon with proximal retraction rotation of the proximal fragment. 2.  Spurring of the coronoid process. 3. Elbow joint effusion. Electronically Signed   By: Van Clines M.D.   On: 01/17/2021 18:07   CT HEAD WO CONTRAST (5MM)  Result Date: 01/17/2021 CLINICAL DATA:  Fall, posterior head trauma.  Patient on Eliquis EXAM: CT HEAD WITHOUT CONTRAST CT CERVICAL SPINE WITHOUT CONTRAST TECHNIQUE: Multidetector CT imaging of the head and cervical spine was performed following the standard protocol without intravenous contrast. Multiplanar CT image reconstructions of the cervical spine were also generated. COMPARISON:  12/20/2020 FINDINGS: CT HEAD FINDINGS Brain: No evidence of acute infarction, hemorrhage, hydrocephalus, extra-axial collection or mass lesion/mass effect. Remote lacunar infarct in the right basal ganglia. Moderate low-density changes within the periventricular and subcortical white matter compatible with chronic microvascular ischemic change. Mild diffuse cerebral volume loss. Vascular: Atherosclerotic calcifications involving the large vessels of the skull base. No unexpected hyperdense vessel. Skull: Normal. Negative for fracture or focal lesion. Sinuses/Orbits: No acute finding. Other: Negative for scalp hematoma. CT CERVICAL SPINE FINDINGS Alignment: Facet joints are aligned without dislocation or traumatic listhesis. Dens and lateral masses are aligned. Degenerative facet-mediated grade 1 anterolisthesis of C3 on C4 and C4 on C5 are unchanged. Skull base and vertebrae: No acute fracture. No primary bone lesion or focal pathologic process. Soft tissues and spinal canal: No prevertebral fluid or swelling. No visible canal hematoma. Disc levels: Similar degree of advanced multilevel facet arthropathy and degenerative disc disease within the cervical spine. Upper chest: Layering left-sided pleural effusion. Other: Bilateral carotid atherosclerosis. IMPRESSION: 1. No acute intracranial findings. 2. No evidence of acute fracture or traumatic listhesis of the cervical  spine. 3. Layering left-sided pleural effusion. Recommend dedicated chest radiographs for further evaluation. 4. Multilevel cervical spondylosis. Electronically Signed   By: Davina Poke D.O.   On: 01/17/2021 18:53   CT CERVICAL SPINE WO CONTRAST  Result Date: 01/17/2021 CLINICAL DATA:  Fall, posterior head trauma.  Patient on Eliquis EXAM: CT HEAD WITHOUT CONTRAST CT CERVICAL SPINE WITHOUT CONTRAST TECHNIQUE: Multidetector CT imaging of the head and cervical spine was performed following the standard protocol without intravenous contrast. Multiplanar CT image reconstructions of the cervical spine were also generated. COMPARISON:  12/20/2020 FINDINGS: CT HEAD FINDINGS Brain: No evidence of acute infarction, hemorrhage, hydrocephalus, extra-axial collection or mass lesion/mass effect. Remote lacunar infarct in the right basal ganglia. Moderate low-density changes within the periventricular and subcortical white matter compatible with chronic microvascular ischemic change. Mild diffuse cerebral volume loss. Vascular: Atherosclerotic calcifications involving the large vessels of the skull base. No unexpected hyperdense vessel. Skull: Normal. Negative for fracture or focal lesion. Sinuses/Orbits: No acute finding. Other: Negative for scalp hematoma. CT CERVICAL SPINE FINDINGS Alignment: Facet joints are aligned without dislocation or traumatic listhesis. Dens and lateral masses are aligned. Degenerative facet-mediated grade 1 anterolisthesis of C3 on C4 and C4 on C5 are unchanged. Skull base and vertebrae: No acute fracture. No primary bone lesion or focal pathologic process. Soft tissues and spinal canal: No prevertebral fluid or swelling. No visible canal hematoma. Disc levels: Similar degree of advanced multilevel facet arthropathy and degenerative disc disease within the cervical spine. Upper chest:  Layering left-sided pleural effusion. Other: Bilateral carotid atherosclerosis. IMPRESSION: 1. No acute  intracranial findings. 2. No evidence of acute fracture or traumatic listhesis of the cervical spine. 3. Layering left-sided pleural effusion. Recommend dedicated chest radiographs for further evaluation. 4. Multilevel cervical spondylosis. Electronically Signed   By: Davina Poke D.O.   On: 01/17/2021 18:53   DG Chest Portable 1 View  Result Date: 01/17/2021 CLINICAL DATA:  Fall, hip fracture EXAM: PORTABLE CHEST 1 VIEW COMPARISON:  CT chest 04/22/2014,  CT abdomen 08/31/2020. FINDINGS: Normal cardiac silhouette. There is a LEFT pleural effusion. No rib fracture or pneumothorax identified. There is underlying interstitial edema pattern. No pneumothorax. No RIGHT effusion IMPRESSION: Unilateral LEFT pleural effusion. No rib fracture or pneumothorax is identified. Electronically Signed   By: Suzy Bouchard M.D.   On: 01/17/2021 20:04   DG Femur Min 2 Views Left  Result Date: 01/17/2021 CLINICAL DATA:  Left upper leg pain after fall EXAM: PELVIS - 1-2 VIEW; LEFT FEMUR 2 VIEWS COMPARISON:  None. FINDINGS: Acute mildly impacted subcapital fracture of the left femoral neck. No significant angulation. No dislocation. Remainder of the left femur is intact. Prior left total knee arthroplasty without complication. Mild diffuse bone demineralization. Advanced lower lumbar spondylosis status post prior L3 vertebral body cement augmentation. Vascular calcifications are present. IMPRESSION: Acute mildly impacted subcapital fracture of the left femoral neck. Electronically Signed   By: Davina Poke D.O.   On: 01/17/2021 18:06   ASSESSMENT AND PLAN:  Diesha Rostad Gaylord is an 87 year old female with mild Alzheimer's dementia who lives in independent living, paroxysmal atrial fibrillation, chronic kidney disease, hypertension who presents to the hospital after a fall in the bathroom at [place]. She is found to have fracture of her left olecranon and left subcapital femoral neck #   Fall   Closed left subcapital  femoral neck fracture (HCC)   Left elbow/olecranon fracture -Orthopedic surgery, Dr. Harlow Mares has been consulted and will be assisting with management - Left arm is in a sling --prn pain meds   Acute onset confusion - UA reveals that there is no UTI - Continue to follow for delirium   Leukocytosis - no infection noted- possibly acute stress response     PAF (paroxysmal atrial fibrillation) ([place]) -Per, the patient's dter  pt has stopped taking Eliquis sometime ago when she was found to have a low hemoglobin and Hemoccult positive stools - Continue amiodarone and Toprol   Diarrhea--chronic due to Collagenous colitis - Recently placed on budesonide by GI - As mentioned above, she was recently found to be anemic with a hemoglobin of 6 and found to have heme positive stool --hgb stable at 12.3 --no rectal bleeding noted     Hypertension   Chronic diastolic congestive heart failure  holding spironolactone and torsemide   Hypothyroidism - Continue levothyroxine     Time spent in minutes: 35 DVT prophylaxis: SCDs Start: 01/17/21 2031 Code Status: DNR per dter Peggye Fothergill Family Communication: Daughter at bedside Level of Care: Level of care: Med-Surg Disposition Plan:  Status is: Inpatient   Remains inpatient appropriate because:IV treatments appropriate due to intensity of illness or inability to take PO   Dispo: The patient is from: Home              Anticipated d/c is to: rehab              Patient currently is not medically stable to d/c.  Difficult to place patient No           TOTAL TIME TAKING CARE OF THIS PATIENT: 25 minutes.  >50% time spent on counselling and coordination of care  Note: This dictation was prepared with Dragon dictation along with smaller phrase technology. Any transcriptional errors that result from this process are unintentional.  Fritzi Mandes M.D    [facility]   CC: Primary care physician; Rusty Aus, MD Patient ID: Pricilla Holm Renteria, female   DOB: 27-Oct-1932, 87 y.o.   MRN: [id number]

## 2021-01-19 NOTE — Op Note (Signed)
01/19/2021  7:35 PM  PATIENT:  [name]    PRE-OPERATIVE DIAGNOSIS:  Left Hip Fracture and Left Elbow Fracture  POST-OPERATIVE DIAGNOSIS:  Same  PROCEDURE:  CLOSED REDUCTION HIP, PERCUTANEOUS PINNING EXTREMITY, OPEN REDUCTION INTERNAL FIXATION (ORIF) ELBOW/OLECRANON FRACTURE  SURGEON:  Lovell Sheehan, MD   ASSIST: Carlynn Spry, PA-C  ANESTHESIA:   General  PREOPERATIVE INDICATIONS:  [name] is a  87 y.o. female who fell and was found to have a diagnosis of Left Hip Fracture and Left Elbow Fracture who elected for surgical management.    The risks benefits and alternatives were discussed with the patient preoperatively including but not limited to the risks of infection, bleeding, nerve injury, cardiopulmonary complications, blood clots, malunion, nonunion, avascular necrosis, the need for revision surgery, the potential for conversion to hemiarthroplasty, among others, and the patient was willing to proceed.  OPERATIVE IMPLANTS: 6.5 mm cannulated screws x3  OPERATIVE FINDINGS: Clinical osteoporosis with weak bone, proximal femur  OPERATIVE PROCEDURE: The patient was brought to the operating room and placed in supine position. IV antibiotics were given. General anesthesia administered.  The patient was placed on the fracture table. The operative extremity was positioned, without any significant reduction maneuver and was prepped and draped in usual sterile fashion.  Time out was performed.  Small incision was made distal to the greater trochanter, and 3 guidewires were introduced into the head and neck. The lengths were measured. The reduction was slightly valgus, and near-anatomic. I opened the cortex with a cannulated drill, and then placed the screws into position. Satisfactory fixation was achieved.  The wounds were irrigated copiously, and repaired with 2-0 Vicryl and staples for the skin. A sterile dressing was applied. Sponge and needle count were correct.  There no  apparent complications and the patient tolerated the procedure well.  The patient was then placed in the lateral position will all pressure points well padded.  Attention was turned to the olecranon fracture.  OPERATIVE IMPLANTS: Two 0.625 K-wires and 20 gauge wire  OPERATIVE FINDINGS: displaced, comminuted fracture of the olecranon  OPERATIVE PROCEDURE:   The arm was prepped and draped sterilely and then exsanguinated.  Tourniquet was inflated to 225 mmHg.  A posterior incision was made starting along the proximal ulna curving laterally and then extending proximally up over the triceps.  Dissection was carried out sharply through subcutaneous tissue down to the posterior bony ridge of the ulna.  Soft tissue was elevated off medially and laterally off the ulna, carefully protecting the ulnar nerve.  The proximal fragment was isolated and the fracture site cleared of all debris and irrigated.  The arm was extended and the fracture was reduced anatomically.  A point-to-point clamp was used to maintain the reduction while the arm was flexed and 2  2 mm wires were drilled through the proximal fragment into the distal fragment.  Drill holes were made in the ulnar shaft distal to the fracture site and a 18-gauge wire passed through these.  The wire was then woven in a figure-of-eight and was tightened on the lateral side of the ulna.  This provided excellent stability to the fracture in flexion and extension.  Fluoroscopy showed the C-wire and the figure-of-eight wire to be in excellent position and alignment.  The fracture was reduced anatomically.  The C-wires  were bent, cut, and buried into the triceps tendon.  The 20-gauge wire was cut, leaving a knot that was buried as well.  Final examination showed excellent  range of motion  with no separation at the fracture site.  After final irrigation, the subcutaneous tissue was closed with 2-0 Vicryl.  The skin was closed with staples.  Quarter percent Marcaine  plain was instilled into the soft tissues.  A soft dressing with a posterior splint was applied and allowed to harden at 100 of flexion.  Tourniquet was deflated with good return of blood flow to the hand.  Patient was awakened and after placing a sling on the arm.  She was taken to the PACU in good condition.  Kurtis Bushman, MD

## 2021-01-19 NOTE — Plan of Care (Signed)
  Problem: Education: Goal: Knowledge of General Education information will improve Description: Including pain rating scale, medication(s)/side effects and non-pharmacologic comfort measures Outcome: Progressing   Problem: Health Behavior/Discharge Planning: Goal: Ability to manage health-related needs will improve Outcome: Progressing   Problem: Clinical Measurements: Goal: Ability to maintain clinical measurements within normal limits will improve Outcome: Progressing Goal: Respiratory complications will improve Outcome: Progressing   Problem: Elimination: Goal: Will not experience complications related to bowel motility Outcome: Progressing Goal: Will not experience complications related to urinary retention Outcome: Progressing   Problem: Pain Managment: Goal: General experience of comfort will improve Outcome: Progressing   Problem: Safety: Goal: Ability to remain free from injury will improve Outcome: Progressing   Problem: Skin Integrity: Goal: Risk for impaired skin integrity will decrease Outcome: Progressing

## 2021-01-19 NOTE — H&P (Signed)
The patient has been re-examined, and the chart reviewed, and there have been no interval changes to the documented history and physical.  Plan a left hip pinning and left elbow open reduction and internal fixation today.  Anesthesia is not consulted regarding a peripheral nerve block for post-operative pain.  The risks, benefits, and alternatives have been discussed at length, and the patient is willing to proceed.

## 2021-01-19 NOTE — Transfer of Care (Signed)
Immediate Anesthesia Transfer of Care Note  Patient: [name]  Procedure(s) Performed: CLOSED REDUCTION HIP (Left: Hip) PERCUTANEOUS PINNING EXTREMITY (Left) OPEN REDUCTION INTERNAL FIXATION (ORIF) ELBOW/OLECRANON FRACTURE (Left: Elbow)  Patient Location: PACU  Anesthesia Type:General  Level of Consciousness: awake, alert  and oriented  Airway & Oxygen Therapy: Patient Spontanous Breathing and Patient connected to face mask oxygen  Post-op Assessment: Report given to RN and Post -op Vital signs reviewed and stable  Post vital signs: Reviewed and stable  Last Vitals:  Vitals Value Taken Time  BP 169/90 01/19/21 1945  Temp    Pulse 71 01/19/21 1945  Resp 10 01/19/21 1946  SpO2 89 % 01/19/21 1945  Vitals shown include unvalidated device data.  Last Pain:  Vitals:   01/19/21 1942  TempSrc:   PainSc: 0-No pain         Complications: No notable events documented.

## 2021-01-19 NOTE — TOC Progression Note (Signed)
Transition of Care [facility]) - Progression Note    Patient Details  Name: [name] MRN: [id number] Date of Birth: 03-Jun-1932  Transition of [facility]) CM/SW [place], RN Phone Number: 01/19/2021, 1:52 PM  Clinical Narrative:   The patient  comes in after a fall at [place], she mild Alzheimer's dementia and  lives in independent living, Surgery is to be done today, TOC Anticipates that she may need STR prior to returning to the ILF, PASSr number obtained [phone] to prepare for STR work up, TOC to continue to monitor for needs and assist with DC plan         Expected Discharge Plan and Services                                                 Social Determinants of Health (SDOH) Interventions    Readmission Risk Interventions Readmission Risk Prevention Plan 12/21/2020  Transportation Screening Complete  PCP or Specialist Appt within 3-5 Days Complete  HRI or [place] Complete  Social Work Consult for [place] Planning/Counseling Not Complete  SW consult not completed comments Patient is assigned to RN CM  Palliative Care Screening Not Applicable  Medication Review [employer]) Complete  Some recent data might be hidden

## 2021-01-19 NOTE — Anesthesia Preprocedure Evaluation (Addendum)
Anesthesia Evaluation  Patient identified by MRN, date of birth, ID band Patient confused  General Assessment Comment:  Hx of what sounds like post operative cognitive dysfunction after anesthesia. Son says it takes the patient weeks to get back to normal.  Reviewed: Allergy & Precautions, NPO status , Patient's Chart, lab work & pertinent test results  History of Anesthesia Complications (+) PONV, Emergence Delirium and history of anesthetic complications  Airway Mallampati: II  TM Distance: >3 FB Neck ROM: Full    Dental  (+) Poor Dentition   Pulmonary neg sleep apnea, COPD, Patient abstained from smoking.Not current smoker,    Pulmonary exam normal breath sounds clear to auscultation       Cardiovascular Exercise Tolerance: Poor METShypertension, + Peripheral Vascular Disease and +CHF  (-) CAD and (-) Past MI (-) dysrhythmias  Rhythm:Regular Rate:Normal - Systolic murmurs    Neuro/Psych PSYCHIATRIC DISORDERS Depression negative neurological ROS     GI/Hepatic GERD  ,(+)     (-) substance abuse  ,   Endo/Other  neg diabetesHypothyroidism   Renal/GU CRFRenal disease     Musculoskeletal  (+) Arthritis ,   Abdominal   Peds  Hematology   Anesthesia Other Findings Past Medical History: No date: (HFpEF) heart failure with preserved ejection fraction ([place])     Comment:  a. 12/2015 Echo: EF 60-65%. no rwma, Gr2 DD, triv AI,               mild MR, mildly dil LA. Mild-mod TR. PASP 12mmHg. No date: Anemia No date: Arthritis     Comment:  "back, hands" (09/08/2015) 2007, 2013: Cancer of right breast [facility])     Comment:  right breast- 2007 radiation-mastectomy No date: Chronic lower back pain No date: CKD (chronic kidney disease), stage IV (HCC) No date: Complication of anesthesia     Comment:  "took me about 1 week to know what was going on after               one of my knee ORs" No date: COPD (chronic obstructive  pulmonary disease) (HCC) No date: DDD (degenerative disc disease), lumbar No date: Depression 09/01/2015: Ductal carcinoma in situ (DCIS) of right breast No date: DVT (deep venous thrombosis) (HCC) No date: Edema     Comment:  FEET/ANKLES No date: GERD (gastroesophageal reflux disease) No date: History of sepsis No date: History of stress test     Comment:  a. 08/2010: EF 73%, no ischemia/infarct. No date: Hyperlipidemia No date: Hypertension No date: Hypothyroid No date: PAD (peripheral artery disease) ([place])     Comment:  a. 08/2015 s/p PTA of R AT w/ drug-coated balloon               angioplasty to R Popliteal; b. 05/2017 ABI: R 1.16, L               0.88-->stable. No date: PAF (paroxysmal atrial fibrillation) ([place])     Comment:  a. 08/2010 s/p DCCV;  b. CHA2DS2VASc = 6-->No OAC 2/2 h/o              falls. No date: PONV (postoperative nausea and vomiting)     Comment:  nausea vomiting long ago with surgery but not recent               ones  Reproductive/Obstetrics  Anesthesia Physical Anesthesia Plan  ASA: 3  Anesthesia Plan: General   Post-op Pain Management:    Induction: Intravenous  PONV Risk Score and Plan: 4 or greater and Ondansetron, Dexamethasone and Treatment may vary due to age or medical condition  Airway Management Planned: Oral ETT  Additional Equipment: None  Intra-op Plan:   Post-operative Plan: Extubation in OR  Informed Consent: I have reviewed the patients History and Physical, chart, labs and discussed the procedure including the risks, benefits and alternatives for the proposed anesthesia with the patient or authorized representative who has indicated his/her understanding and acceptance.   Patient has DNR.  Discussed DNR with power of attorney, Discussed DNR with patient and Suspend DNR.   Dental advisory given and Consent reviewed with POA  Plan Discussed with: CRNA and Surgeon  Anesthesia  Plan Comments: (Discussed risks of anesthesia with patient and son at bedside, including PONV, sore throat, lip/dental damage, Post operative cognitive dysfunction. Rare risks discussed as well, such as cardiorespiratory and neurological sequelae, and allergic reactions. Discussed DNR status and patient/son believe suspending DNR perioperatively is in line with her wishes.  Patient has listed allergy to PCN. - unknown reaction many decades ago. Severe blistering skin reaction (SJS/TEN)? no Liver or kidney injury caused by PCN? no Hemolytic anemia from PCN? no Drug fever? no Painful swollen joints? no Severe reaction involving inside of mouth, eye, or genital ulcers? no Based on current evidence Alfonse Alpers et al, J Allergy Clin Immunol Pract, 2019), will proceed with cefazolin use: Yes  )     Anesthesia Quick Evaluation

## 2021-01-20 ENCOUNTER — Encounter: Payer: Self-pay | Admitting: Orthopedic Surgery

## 2021-01-20 DIAGNOSIS — K219 Gastro-esophageal reflux disease without esophagitis: Secondary | ICD-10-CM | POA: Diagnosis not present

## 2021-01-20 DIAGNOSIS — S72002A Fracture of unspecified part of neck of left femur, initial encounter for closed fracture: Secondary | ICD-10-CM | POA: Diagnosis not present

## 2021-01-20 DIAGNOSIS — W19XXXA Unspecified fall, initial encounter: Secondary | ICD-10-CM | POA: Diagnosis not present

## 2021-01-20 DIAGNOSIS — S42402A Unspecified fracture of lower end of left humerus, initial encounter for closed fracture: Secondary | ICD-10-CM | POA: Diagnosis not present

## 2021-01-20 MED ORDER — PANTOPRAZOLE SODIUM 40 MG PO TBEC
40.0000 mg | DELAYED_RELEASE_TABLET | Freq: Every day | ORAL | Status: DC
Start: 2021-01-20 — End: 2021-01-23
  Administered 2021-01-20 – 2021-01-22 (×3): 40 mg via ORAL
  Filled 2021-01-20 (×4): qty 1

## 2021-01-20 MED ORDER — SPIRONOLACTONE 25 MG PO TABS
25.0000 mg | ORAL_TABLET | Freq: Every day | ORAL | Status: DC
  Administered 2021-01-20 – 2021-01-24 (×5): 25 mg via ORAL
  Filled 2021-01-20 (×5): qty 1

## 2021-01-20 MED ORDER — AMIODARONE HCL 200 MG PO TABS
200.0000 mg | ORAL_TABLET | Freq: Every day | ORAL | Status: DC
  Administered 2021-01-21 – 2021-01-24 (×4): 200 mg via ORAL
  Filled 2021-01-20 (×4): qty 1

## 2021-01-20 MED ORDER — ASPIRIN EC 81 MG PO TBEC
81.0000 mg | DELAYED_RELEASE_TABLET | Freq: Every day | ORAL | Status: DC
  Administered 2021-01-20 – 2021-01-22 (×3): 81 mg via ORAL
  Filled 2021-01-20 (×4): qty 1

## 2021-01-20 NOTE — Progress Notes (Signed)
Patient returned from procedure, she was noted lethargic and able to arouse to voice. Bilateral upper extremities noted cold to touch, had trouble obtaining a temperature with assessment vitals. Patients oxygenation noted low 90's on 2 liters, yellow MEWS triggered. Hospitalist [place] notified, provided orders to manage B/P, Oxygen status and cool extremities. Patient MEWS resolved within 2 hour follow up. No PO medications were administered due to patients lethargy. Spoke with daughter Siegmann and provided full updates with patient status. Patient alert and verbals during shift change report. Will continue to monitor.

## 2021-01-20 NOTE — Progress Notes (Signed)
[name] at [place] NAME: [name]    MR#:  [id number]  DATE OF BIRTH:  1933/04/16  SUBJECTIVE:  Pt is more awake and pleasant Daughter Jody at bedside gives most of the history. Patient apparently had a mechanical fall at [place].  POD #1 REVIEW OF SYSTEMS:   Review of Systems  Unable to perform ROS: Mental status change  Tolerating Diet: npo Tolerating PT: SNF  DRUG ALLERGIES:   Allergies  Allergen Reactions   Other Other (See Comments)    NO BP, VENIPUNCTURE, OR ACCESS IN RIGHT ARM - S/P MASTECTOMY ON RIGHT   Amlodipine Nausea And Vomiting   Ciprofloxacin Nausea Only   Doxycycline Itching and Other (See Comments)    shaky   Lipitor [Atorvastatin] Other (See Comments)    Joint pain    Morphine And Related Nausea And Vomiting   Macrobid [Nitrofurantoin Macrocrystal] Rash   Penicillins Rash and Other (See Comments)    Has patient had a PCN reaction causing immediate rash, facial/tongue/throat swelling, SOB or lightheadedness with hypotension: yes Has patient had a PCN reaction causing severe rash involving mucus membranes or skin necrosis: no Has patient had a PCN reaction that required hospitalization no Has patient had a PCN reaction occurring within the last 10 years: no If all of the above answers are "NO", then may proceed with Cephalosporin use.    Sulfa Antibiotics Rash    VITALS:  Blood pressure 128/71, pulse 89, temperature (!) 97.4 F (36.3 C), resp. rate 17, height 5\' 8"  (1.727 m), weight 68.2 kg, SpO2 90 %.  PHYSICAL EXAMINATION:   Physical Exam  GENERAL:  87 y.o.-year-old patient lying in the bed with no acute distress.  LUNGS: Normal breath sounds bilaterally, no wheezing, rales, rhonchi. No use of accessory muscles of respiration.  CARDIOVASCULAR: S1, S2 normal. No murmurs, rubs, or gallops.  ABDOMEN: Soft, nontender, nondistended. Bowel sounds present. No organomegaly or mass.  EXTREMITIES: No  cyanosis, clubbing or edema b/l.    NEUROLOGIC: grossly non focal   PSYCHIATRIC:  patient is alert and awake SKIN: No obvious rash, lesion, or ulcer.   LABORATORY PANEL:  CBC Recent Labs  Lab 01/19/21 0317  WBC 14.2*  HGB 12.9  HCT 39.0  PLT 166     Chemistries  Recent Labs  Lab 01/19/21 0317  NA 139  K 3.9  CL 104  CO2 26  GLUCOSE 106*  BUN 19  CREATININE 1.42*  CALCIUM 8.4*    Cardiac Enzymes No results for input(s): TROPONINI in the last 168 hours. RADIOLOGY:  DG MINI C-ARM IMAGE ONLY  Result Date: 01/19/2021 There is no interpretation for this exam.  This order is for images obtained during a surgical procedure.  Please See "Surgeries" Tab for more information regarding the procedure.   DG HIP OPERATIVE UNILAT WITH PELVIS LEFT  Result Date: 01/19/2021 CLINICAL DATA:  Hip pinning EXAM: OPERATIVE left HIP (WITH PELVIS IF PERFORMED) 5 VIEWS TECHNIQUE: Fluoroscopic spot image(s) were submitted for interpretation post-operatively. COMPARISON:  01/17/2021 FINDINGS: Five low resolution intraoperative spot views of the left hip. Total fluoroscopy time was 27 seconds. The images demonstrate 3 threaded screw fixation of left femoral neck fracture IMPRESSION: Intraoperative fluoroscopic assistance provided during surgical fixation of left femoral neck fracture Electronically Signed   By: Donavan Foil M.D.   On: 01/19/2021 18:50   ASSESSMENT AND PLAN:  [name] is an 87 year old female with mild Alzheimer's dementia who lives in  independent living, paroxysmal atrial fibrillation, chronic kidney disease, hypertension who presents to the hospital after a fall in the bathroom at [place]. She is found to have fracture of her left olecranon and left subcapital femoral neck #   Fall   Closed left subcapital femoral neck fracture (HCC)   Left elbow/olecranon fracture -Orthopedic surgery, Dr. Harlow Mares has been consulted and will be assisting with management - Left arm is in a  sling --prn pain meds --10/6--POD #1 hip and elbow surgery   Acute onset confusion - UA reveals that there is no UTI - Continue to follow for delirium   Leukocytosis - no infection noted- possibly acute stress response     PAF (paroxysmal atrial fibrillation) ([place]) -Per, the patient's dter  pt has stopped taking Eliquis sometime ago when she was found to have a low hemoglobin and Hemoccult positive stools - Continue amiodarone and Toprol   Diarrhea--chronic due to Collagenous colitis - Recently placed on budesonide by GI - As mentioned above, she was recently found to be anemic with a hemoglobin of 6 and found to have heme positive stool --hgb stable at 12.3 --no rectal bleeding noted    Hypertension  Chronic diastolic congestive heart failure  holding torsemide  resumed spirnolactone  Hypothyroidism - Continue levothyroxine     Time spent in minutes: 35 DVT prophylaxis: SCDs Start: 01/17/21 2031 Code Status: DNR per dter Peggye Fothergill Family Communication: Daughter at bedside Level of Care: Level of care: Med-Surg Disposition Plan:  Status is: Inpatient   Remains inpatient appropriate because:IV treatments appropriate due to intensity of illness or inability to take PO   Dispo: The patient is from: Home              Anticipated d/c is to: rehab              Patient currently is medically stable to d/c--awaiting insurance auth. Family has accepted bed at [facility]              Difficult to place patient No    TOTAL TIME TAKING CARE OF THIS PATIENT: 25 minutes.  >50% time spent on counselling and coordination of care  Note: This dictation was prepared with Dragon dictation along with smaller phrase technology. Any transcriptional errors that result from this process are unintentional.  [name] M.D    [facility]   CC: Primary care physician; Rusty Aus, MD Patient ID: Pricilla Holm Bailey, female   DOB: [birth year], 87 y.o.   MRN: [id number]

## 2021-01-20 NOTE — Progress Notes (Signed)
PHARMACIST - PHYSICIAN COMMUNICATION  CONCERNING:  Enoxaparin (Lovenox) for DVT Prophylaxis    RECOMMENDATION: Patient was prescribed enoxaprin 40mg  q24 hours for VTE prophylaxis.   Filed Weights   01/20/21 0318  Weight: 68.2 kg (150 lb 5.7 oz)    Body mass index is 22.86 kg/m.  Estimated Creatinine Clearance: 29.2 mL/min (A) (by C-G formula based on SCr of 1.42 mg/dL (H)).  Patient is candidate for enoxaparin 30mg  every 24 hours based on CrCl <64ml/min or Weight <45kg  DESCRIPTION: Pharmacy has adjusted enoxaparin dose per [facility] policy.  Patient is now receiving enoxaparin 30 mg every 24 hours   Renda Rolls, PharmD, [facility] 01/20/2021 5:55 AM

## 2021-01-20 NOTE — Evaluation (Signed)
Occupational Therapy Evaluation Patient Details Name: [name] MRN: [id number] DOB: August 03, 1932 Today's Date: 01/20/2021   History of Present Illness [name] is a 87 y.o. female with past medical history of COPD, CHF, HTN, HDL, GERD, hypothyroidism, paroxysmal A. fib on Eliquis, DVT and arthritis who presents coming by daughter for assessment of acute left elbow and left hip pain after a fall. 01/17/21 DG Pelvis: Acute mildly impacted subcapital fracture of the left femoral neck. 01/17/21 DG L elbow: Acute transverse fracture of the olecranon with proximal retraction and substantial rotation of the proximal fragment.   Clinical Impression   [name] was seen for OT evaluation this date. Prior to hospital admission, pt was MOD I for mobility and ADLs, wears depends for community excursions. Pt lives alone at [place]. Pt presents to acute OT demonstrating impaired ADL performance and functional mobility 2/2 decreased activity tolerance, functional strength/ROM/balance deficits, and poor insight into deficits. Upon arrival pt reclined in bed, LUE sling/splint in place, daughter at bedside. Pt scored 16/60 on the Orientation Log, free recall for name of hospital, month/year, and situation.   Pt currently requires SETUP + CGA self-drinking seated EOB using dominant RUE. MAX A for bed>chair SPT. MAX A don B socks seated EOC. RN notified purewick changed, requesting to replace at end of session. PT in room end of session notified. Pt would benefit from skilled OT to address noted impairments to maximize safety and independence while minimizing falls risk and caregiver burden. Upon hospital discharge, recommend STR to maximize pt safety and return to PLOF.     Recommendations for follow up therapy are one component of a multi-disciplinary discharge planning process, led by the attending physician.  Recommendations may be updated based on patient status, additional functional criteria and insurance  authorization.   Follow Up Recommendations  SNF    Equipment Recommendations  Other (comment) (defer to next venue of care)    Recommendations for Other Services       Precautions / Restrictions Precautions Precautions: Fall Restrictions Weight Bearing Restrictions: Yes LUE Weight Bearing: Non weight bearing LLE Weight Bearing: Weight bearing as tolerated      Mobility Bed Mobility Overal bed mobility: Needs Assistance Bed Mobility: Supine to Sit     Supine to sit: Max assist;+2 for physical assistance;HOB elevated     General bed mobility comments: cognition limiting pt participation    Transfers Overall transfer level: Needs assistance   Transfers: Stand Pivot Transfers   Stand pivot transfers: Max assist;From elevated surface       General transfer comment: small shuffling steps, difficulty advancing LLE    Balance Overall balance assessment: Needs assistance Sitting-balance support: Single extremity supported;Feet supported Sitting balance-[name]: Fair     Standing balance support: Single extremity supported Standing balance-[name]: Poor                             ADL either performed or assessed with clinical judgement   ADL Overall ADL's : Needs assistance/impaired                                       General ADL Comments: MAX A don B socks seated EOC. MAX A for ADL t/f. SETUP + CGA self-drinking seated EOB using dominant RUE      Pertinent Vitals/Pain Pain Assessment: Faces Faces  Pain Scale: Hurts little more Pain Location: LUE Pain Descriptors / Indicators: Discomfort;Grimacing Pain Intervention(s): Limited activity within patient's tolerance;Repositioned     Hand Dominance Right   Extremity/Trunk Assessment Upper Extremity Assessment Upper Extremity Assessment: LUE deficits/detail LUE Deficits / Details: splint and sling on LUE: Unable to fully assess due to immobilization;Unable to fully  assess due to pain   Lower Extremity Assessment Lower Extremity Assessment: Generalized weakness;LLE deficits/detail;Difficult to assess due to impaired cognition LLE Deficits / Details: impaired flexion, unclear if cognition limiting       Communication Communication Communication: HOH   Cognition Arousal/Alertness: Awake/alert Behavior During Therapy: WFL for tasks assessed/performed Overall Cognitive Status: History of cognitive impairments - at baseline                                 General Comments: oreientation log 16/30. free recall of location as [facility], date as october, year as 2022.   General Comments       Exercises Exercises: Other exercises Other Exercises Other Exercises: Pt and family educated re; OT role, DME recs, d/c recs, falls prevention, HEP Other Exercises: LBD, UBD, self-drinking, sup>sit, sitting/standing balance/toelrance   Shoulder Instructions      Home Living Family/patient expects to be discharged to:: Private residence Living Arrangements: Alone Available Help at Discharge: Family;Friend(s);Available PRN/intermittently Type of Home: Independent living facility Home Access: Level entry     Home Layout: One level     Bathroom Shower/Tub: Walk-in shower         Home Equipment: Environmental consultant - 2 wheels;Bedside commode;Shower seat;Walker - 4 wheels          Prior Functioning/Environment Level of Independence: Independent with assistive device(s)        Comments: Daughter in room reports pt resident at [place]. Pt recently started using RW for mobility however dtr uncertain if pt uses consistently. ILF provides 1 meal/day in dining hall. Per chart at least 3 falls in last month.        OT Problem List: Decreased strength;Decreased activity tolerance;Impaired balance (sitting and/or standing);Decreased range of motion;Decreased safety awareness      OT Treatment/Interventions: Self-care/ADL training;Therapeutic  exercise;Energy conservation;DME and/or AE instruction;Therapeutic activities;Patient/family education;Balance training    OT Goals(Current goals can be found in the care plan section) Acute Rehab OT Goals Patient Stated Goal: to feel better OT Goal Formulation: With patient/family Time For Goal Achievement: 02/03/21 Potential to Achieve Goals: Good  OT Frequency: Min 2X/week   Barriers to D/C: Decreased caregiver support          Co-evaluation              AM-PAC OT "6 Clicks" Daily Activity     Outcome Measure Help from another person eating meals?: A Little Help from another person taking care of personal grooming?: A Little Help from another person toileting, which includes using toliet, bedpan, or urinal?: A Lot Help from another person bathing (including washing, rinsing, drying)?: A Lot Help from another person to put on and taking off regular upper body clothing?: A Little Help from another person to put on and taking off regular lower body clothing?: A Lot 6 Click Score: 15   End of Session Nurse Communication:  (RN cleared to return purewick found on pt)  Activity Tolerance: Patient tolerated treatment well Patient left: in chair;with call bell/phone within reach;with family/visitor present (PT in room)  OT Visit Diagnosis: Other abnormalities of  gait and mobility (R26.89);Muscle weakness (generalized) (M62.81)                Time: 2725-3664 OT Time Calculation (min): 26 min Charges:  OT General Charges $OT Visit: 1 Visit OT Evaluation $OT Eval Moderate Complexity: 1 Mod OT Treatments $Self Care/Home Management : 8-22 mins  Dessie Coma, M.S. OTR/L  01/20/21, 1:08 PM  ascom [phone]

## 2021-01-20 NOTE — Evaluation (Signed)
Physical Therapy Evaluation Patient Details Name: [name] MRN: [id number] DOB: 1932/10/08 Today's Date: 01/20/2021  History of Present Illness  [name] is a 87 y.o. female with past medical history of COPD, CHF, HTN, HDL, GERD, hypothyroidism, paroxysmal A. fib on Eliquis, DVT and arthritis who presents coming by daughter for assessment of acute left elbow and left hip pain after a fall. 01/17/21 DG Pelvis: Acute mildly impacted subcapital fracture of the left femoral neck. 01/17/21 DG L elbow: Acute transverse fracture of the olecranon with proximal retraction and substantial rotation of the proximal fragment.  Clinical Impression  Pt is a pleasant 87 year old female who was admitted for fall and is now s/p L hip/elbow ORIF POD 1 at time of evaluation. Pt performs transfers with max assist +2 and unable to ambulate at this time. Pt demonstrates deficits with pain/mobility/cognition/endurance. Pt complains of dizziness; BP obtained. Pt is currently not at baseline level. Would benefit from skilled PT to address above deficits and promote optimal return to PLOF; recommend transition to STR upon discharge from acute hospitalization.  BP readings: Sit: 130/78 Stand: 127/94     Recommendations for follow up therapy are one component of a multi-disciplinary discharge planning process, led by the attending physician.  Recommendations Latoya be updated based on patient status, additional functional criteria and insurance authorization.  Follow Up Recommendations SNF    Equipment Recommendations  None recommended by PT    Recommendations for Other Services       Precautions / Restrictions Precautions Precautions: Fall Restrictions Weight Bearing Restrictions: Yes LUE Weight Bearing: Non weight bearing LLE Weight Bearing: Weight bearing as tolerated      Mobility  Bed Mobility Overal bed mobility: Needs Assistance Bed Mobility: Supine to Sit     Supine to sit: Max assist;+2 for  physical assistance;HOB elevated     General bed mobility comments: not performed as received up on recliner    Transfers Overall transfer level: Needs assistance Equipment used: 2 person hand held assist;Hemi-walker Transfers: Sit to/from Stand Sit to Stand: Max assist;+2 physical assistance Stand pivot transfers: Max assist;From elevated surface       General transfer comment: Pt in recliner upon arrival. +2 for sit<>Stand x 2 attempts. Guarded and unable to WB on surgical leg, keeps R knee in flexed position with heavy cues for upright stance. On 2nd attempt, HW used but unable to grasp concept, reverted back to HHA. Not safe to attempt ambulation at this time. Does report dizziness with mobility efforts  Ambulation/Gait             General Gait Details: not performed this date  Stairs            Wheelchair Mobility    Modified Rankin (Stroke Patients Only)       Balance Overall balance assessment: Needs assistance Sitting-balance support: Single extremity supported;Feet supported Sitting balance-[name]: Fair     Standing balance support: Single extremity supported Standing balance-[name]: Poor                               Pertinent Vitals/Pain Pain Assessment: Faces Faces Pain Scale: Hurts whole lot Pain Location: LUE Pain Descriptors / Indicators: Discomfort;Grimacing Pain Intervention(s): Limited activity within patient's tolerance;Repositioned    Home Living Family/patient expects to be discharged to:: Private residence (indep living) Living Arrangements:  (indep living) Available Help at Discharge: Family;Friend(s);Available PRN/intermittently Type of Home: Independent living  facility Home Access: Level entry     Home Layout: One level Home Equipment: [place] - 2 wheels;Bedside commode;Shower seat;Walker - 4 wheels      Prior Function Level of Independence: Independent with assistive device(s)         Comments:  Daughter in room reports pt resident at [place]. Pt recently started using RW for mobility however dtr uncertain if pt uses consistently. ILF provides 1 meal/day in dining hall. Per chart at least 3 falls in last month.     Hand Dominance   Dominant Hand: Right    Extremity/Trunk Assessment   Upper Extremity Assessment Upper Extremity Assessment: Defer to OT evaluation LUE Deficits / Details: splint and sling on LUE: Unable to fully assess due to immobilization;Unable to fully assess due to pain    Lower Extremity Assessment Lower Extremity Assessment: Generalized weakness;LLE deficits/detail;Difficult to assess due to impaired cognition LLE Deficits / Details: hip flexion 2/5; decreased L knee ROM       Communication   Communication: HOH  Cognition Arousal/Alertness: Awake/alert Behavior During Therapy: WFL for tasks assessed/performed Overall Cognitive Status: History of cognitive impairments - at baseline                                 General Comments: oriented to self      General Comments      Exercises Other Exercises Other Exercises: Pt and family educated re; OT role, DME recs, d/c recs, falls prevention, HEP Other Exercises: LBD, UBD, self-drinking, sup>sit, sitting/standing balance/toelrance Other Exercises: 2 sit<>stand performed due to incontience, purewick placed and chux pad changed. Also obtained BP readings due to dizziness   Assessment/Plan    PT Assessment Patient needs continued PT services  PT Problem List Decreased strength;Decreased balance;Decreased mobility;Pain;Decreased cognition;Decreased knowledge of use of DME;Decreased safety awareness       PT Treatment Interventions DME instruction;Gait training;Therapeutic exercise;Balance training    PT Goals (Current goals can be found in the Care Plan section)  Acute Rehab PT Goals Patient Stated Goal: to feel better PT Goal Formulation: With patient/family Time For Goal  Achievement: 02/03/21 Potential to Achieve Goals: Good    Frequency BID   Barriers to discharge        Co-evaluation               AM-PAC PT "6 Clicks" Mobility  Outcome Measure Help needed turning from your back to your side while in a flat bed without using bedrails?: A Lot Help needed moving from lying on your back to sitting on the side of a flat bed without using bedrails?: A Lot Help needed moving to and from a bed to a chair (including a wheelchair)?: Total Help needed standing up from a chair using your arms (e.g., wheelchair or bedside chair)?: Total Help needed to walk in hospital room?: Total Help needed climbing 3-5 steps with a railing? : Total 6 Click Score: 8    End of Session Equipment Utilized During Treatment: Gait belt Activity Tolerance:  (limited by cognition) Patient left: in chair;with chair alarm set;with family/visitor present Nurse Communication: Mobility status PT Visit Diagnosis: Repeated falls (R29.6);Muscle weakness (generalized) (M62.81);History of falling (Z91.81);Difficulty in walking, not elsewhere classified (R26.2);Pain Pain - Right/Left: Left Pain - part of body: Hip    Time: 9678-9381 PT Time Calculation (min) (ACUTE ONLY): 32 min   Charges:   PT Evaluation $PT Eval Low Complexity: 1 Low  PT Treatments $Therapeutic Activity: 8-22 mins        Greggory Stallion, [place], DPT [phone]   Rayquan Amrhein 01/20/2021, 1:25 PM

## 2021-01-20 NOTE — NC FL2 (Signed)
[place] LEVEL OF CARE SCREENING TOOL     IDENTIFICATION  Patient Name: [name] Birthdate: 06-08-1932 Sex: female Admission Date (Current Location): 01/17/2021  [facility] [place] Number:  Engineering geologist and Address:  [facility], [address] [place], [place] [address]      Provider Number: [id number]  Attending Physician Name and Address:  Fritzi Mandes, MD  Relative Name and Phone Number:  Remo Lipps SOn [phone]    Current Level of Care: Hospital Recommended Level of Care: [place] Prior Approval Number:    Date Approved/Denied: 01/20/21 PASRR Number: [id number]  Discharge Plan: SNF    Current Diagnoses: Patient Active Problem List   Diagnosis Date Noted   Fall 01/17/2021   Closed left hip fracture ([place]) 01/17/2021   Left elbow fracture 01/17/2021   Syncope and collapse 12/20/2020   Acute deep vein thrombosis (DVT) of left peroneal vein ([place]) 03/06/2018   Arterial leg ulcer ([place]) 03/06/2018   Hypertensive heart and kidney disease with chronic diastolic congestive heart failure and stage 3 chronic kidney disease ([place]) 03/06/2018   GERD without esophagitis 03/06/2018   CKD (chronic kidney disease) stage 3, GFR 30-59 ml/min (HCC) 03/06/2018   Anemia 03/06/2018   Hypothyroidism due to acquired atrophy of thyroid 03/06/2018   Dyslipidemia 03/06/2018   Primary osteoarthritis of left knee 03/06/2018   Status post total knee replacement using cement, left 02/26/2018   Orthostatic hypotension 01/14/2016   ARF (acute renal failure) ([place]) 01/14/2016   Dehydration 01/14/2016   Protein-calorie malnutrition, severe 01/12/2016   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   PAD (peripheral artery disease) ([place]) 08/10/2014   Chronic diastolic congestive heart failure ([place]) 02/26/2014   Renal insufficiency 09/14/2011   PAF (paroxysmal atrial fibrillation) ([place]) 08/09/2010   Hypertension  08/09/2010    Orientation RESPIRATION BLADDER Height & Weight     Self, Time, Situation, Place  Normal Continent Weight: 68.2 kg Height:  5\' 8"  (172.7 cm)  BEHAVIORAL SYMPTOMS/MOOD NEUROLOGICAL BOWEL NUTRITION STATUS      Continent Diet (regular)  AMBULATORY STATUS COMMUNICATION OF NEEDS Skin   Extensive Assist Verbally Normal                       Personal Care Assistance Level of Assistance  Bathing, Feeding, Dressing Bathing Assistance: Limited assistance Feeding assistance: Independent Dressing Assistance: Limited assistance     Functional Limitations Info  Hearing   Hearing Info: Impaired      SPECIAL CARE FACTORS FREQUENCY  PT (By licensed PT), OT (By licensed OT)     PT Frequency: 5 times per week OT Frequency: 5 times per week            Contractures Contractures Info: Not present    Additional Factors Info  Code Status, Allergies Code Status Info: DNR Allergies Info: Amlodipine, Ciprofloxacin, Doxycycline, Lipitor (Atorvastatin), Morphine And Related, Macrobid (Nitrofurantoin Macrocrystal), Penicillins, Sulfa Antibiotics           Current Medications (01/20/2021):  This is the current hospital active medication list Current Facility-Administered Medications  Medication Dose Route Frequency Provider Last Rate Last Admin   0.9 %  sodium chloride infusion   Intravenous Continuous Lovell Sheehan, MD 75 mL/hr at 01/19/21 2140 New Bag at 01/19/21 2140   acetaminophen (TYLENOL) tablet 650 mg  650 mg Oral Q4H PRN Lovell Sheehan, MD   650 mg at 01/17/21 2243   [START ON 01/21/2021]  amiodarone (PACERONE) tablet 200 mg  200 mg Oral Daily Fritzi Mandes, MD       aspirin EC tablet 81 mg  81 mg Oral Daily Fritzi Mandes, MD       budesonide (ENTOCORT EC) 24 hr capsule 9 mg  9 mg Oral q morning Fritzi Mandes, MD   9 mg at 01/20/21 5427   calcium-vitamin D (OSCAL WITH D) 500-200 MG-UNIT per tablet 1 tablet  1 tablet Oral Q breakfast Fritzi Mandes, MD   1 tablet at  01/20/21 0623   cholecalciferol (VITAMIN D3) tablet 5,000 Units  5,000 Units Oral Daily Fritzi Mandes, MD   5,000 Units at 01/20/21 7628   clindamycin (CLEOCIN) IVPB 900 mg  900 mg Intravenous 30 min Pre-Op Lovell Sheehan, MD       docusate sodium (COLACE) capsule 100 mg  100 mg Oral BID Lovell Sheehan, MD   100 mg at 01/20/21 0930   enoxaparin (LOVENOX) injection 30 mg  30 mg Subcutaneous Q24H Lovell Sheehan, MD   30 mg at 01/20/21 0931   famotidine (PEPCID) tablet 40 mg  40 mg Oral QHS Fritzi Mandes, MD       feeding supplement (ENSURE ENLIVE / ENSURE PLUS) liquid 237 mL  237 mL Oral BID BM Lovell Sheehan, MD   237 mL at 01/20/21 0931   HYDROcodone-acetaminophen (NORCO/VICODIN) 5-325 MG per tablet 1 tablet  1 tablet Oral BID PRN Lovell Sheehan, MD   1 tablet at 01/18/21 3151   levothyroxine (SYNTHROID) tablet 112 mcg  112 mcg Oral QAC breakfast Lovell Sheehan, MD   112 mcg at 01/19/21 7616   metoCLOPramide (REGLAN) tablet 5-10 mg  5-10 mg Oral Q8H PRN Lovell Sheehan, MD       Or   metoCLOPramide (REGLAN) injection 5-10 mg  5-10 mg Intravenous Q8H PRN Lovell Sheehan, MD       metoprolol succinate (TOPROL-XL) 24 hr tablet 25 mg  25 mg Oral QHS Lovell Sheehan, MD   25 mg at 01/18/21 2050   metoprolol succinate (TOPROL-XL) 24 hr tablet 50 mg  50 mg Oral Daily Lovell Sheehan, MD   50 mg at 01/20/21 0737   multivitamin with minerals tablet 1 tablet  1 tablet Oral Daily Fritzi Mandes, MD   1 tablet at 01/20/21 0929   ondansetron (ZOFRAN) tablet 4 mg  4 mg Oral Q6H PRN Lovell Sheehan, MD       Or   ondansetron [facility]) injection 4 mg  4 mg Intravenous Q6H PRN Lovell Sheehan, MD       pantoprazole (PROTONIX) EC tablet 40 mg  40 mg Oral Daily Fritzi Mandes, MD       PARoxetine (PAXIL) tablet 20 mg  20 mg Oral Daily Lovell Sheehan, MD   20 mg at 01/20/21 1062   spironolactone (ALDACTONE) tablet 25 mg  25 mg Oral Daily Fritzi Mandes, MD         Discharge Medications: Please see discharge summary  for a list of discharge medications.  Relevant Imaging Results:  Relevant Lab Results:   Additional Information IR#[id number]  Conception Oms, RN

## 2021-01-20 NOTE — Progress Notes (Signed)
PT Cancellation Note  Patient Details Name: [name] MRN: [id number] DOB: 11-06-1932   Cancelled Treatment:    Reason Eval/Treat Not Completed: Other (comment). Chart reviewed and evaluation attempted. RN performing med pass and pt just received breakfast. Asked to return at a later time. Will re-attempt.   Keone Kamer 01/20/2021, 9:45 AM Greggory Stallion, PT, DPT [phone]

## 2021-01-20 NOTE — Progress Notes (Signed)
Physical Therapy Treatment Patient Details Name: [name] MRN: [id number] DOB: Sep 09, 1932 Today's Date: 01/20/2021   History of Present Illness [name] is a 87 y.o. female with past medical history of COPD, CHF, HTN, HDL, GERD, hypothyroidism, paroxysmal A. fib on Eliquis, DVT and arthritis who presents coming by daughter for assessment of acute left elbow and left hip pain after a fall. 01/17/21 DG Pelvis: Acute mildly impacted subcapital fracture of the left femoral neck. 01/17/21 DG L elbow: Acute transverse fracture of the olecranon with proximal retraction and substantial rotation of the proximal fragment.    PT Comments    Pt seen this pm for return to bed transfer after sitting up in recliner for ~4 hours. Pt and family educated on transfer technique. Pt transferred sit to stand, stand pivot with MaxA. Pt able to stand upright and accept some weight through L LE. Mod of 2 for sit to supine. Pt positioned to comfort with L UE elevated for edema control and B heels floating, HOB raised, SCD's reapplied. Bed lowered with alarm on and family at bedside. Continue PT per POC.    Recommendations for follow up therapy are one component of a multi-disciplinary discharge planning process, led by the attending physician.  Recommendations may be updated based on patient status, additional functional criteria and insurance authorization.  Follow Up Recommendations  SNF     Equipment Recommendations  None recommended by PT    Recommendations for Other Services       Precautions / Restrictions Precautions Precautions: Fall Restrictions Weight Bearing Restrictions: Yes LUE Weight Bearing: Non weight bearing LLE Weight Bearing: Weight bearing as tolerated     Mobility  Bed Mobility Overal bed mobility: Needs Assistance Bed Mobility: Sit to Supine     Supine to sit: Mod assist;+2 for physical assistance     General bed mobility comments: not performed as received up on recliner     Transfers Overall transfer level: Needs assistance Equipment used: 2 person hand held assist;Hemi-walker Transfers: Sit to/from [facility] Sit to Stand: Max assist (of 1)         General transfer comment:  (Repeated verbal, visual, and tactile cues to complete task)  Ambulation/Gait             General Gait Details: not performed this date   Stairs             Wheelchair Mobility    Modified Rankin (Stroke Patients Only)       Balance Overall balance assessment: Needs assistance Sitting-balance support: Single extremity supported;Feet supported Sitting balance-[name]: Fair     Standing balance support: Single extremity supported Standing balance-[name]: Poor                              Cognition Arousal/Alertness: Awake/alert Behavior During Therapy: WFL for tasks assessed/performed Overall Cognitive Status: History of cognitive impairments - at baseline                                 General Comments: oriented to self      Exercises Other Exercises Other Exercises:  (Family present and educated on safe transfer technique as well as positioning) Other Exercises: 2 sit<>stand performed due to incontience, purewick placed and chux pad changed. Also obtained BP readings due to dizziness    General Comments  Pertinent Vitals/Pain Pain Assessment: Faces Faces Pain Scale: Hurts little more Pain Location: LUE Pain Descriptors / Indicators: Discomfort;Grimacing Pain Intervention(s): Limited activity within patient's tolerance;Monitored during session    Home Living Family/patient expects to be discharged to:: Private residence (indep living) Living Arrangements:  (indep living) Available Help at Discharge: Family;Friend(s);Available PRN/intermittently Type of Home: Independent living facility Home Access: Level entry   Home Layout: One level Home Equipment: [place] - 2 wheels;Bedside  commode;Shower seat;Walker - 4 wheels      Prior Function Level of Independence: Independent with assistive device(s)      Comments: Daughter in room reports pt resident at [facility]. Pt recently started using RW for mobility however dtr uncertain if pt uses consistently. ILF provides 1 meal/day in dining hall. Per chart at least 3 falls in last month.   PT Goals (current goals can now be found in the care plan section) Acute Rehab PT Goals Patient Stated Goal: to feel better PT Goal Formulation: With patient/family Time For Goal Achievement: 02/03/21 Potential to Achieve Goals: Good    Frequency    BID      PT Plan Current plan remains appropriate    Co-evaluation              AM-PAC PT "6 Clicks" Mobility   Outcome Measure  Help needed turning from your back to your side while in a flat bed without using bedrails?: A Lot Help needed moving from lying on your back to sitting on the side of a flat bed without using bedrails?: A Lot Help needed moving to and from a bed to a chair (including a wheelchair)?: Total Help needed standing up from a chair using your arms (e.g., wheelchair or bedside chair)?: Total Help needed to walk in hospital room?: Total Help needed climbing 3-5 steps with a railing? : Total 6 Click Score: 8    End of Session Equipment Utilized During Treatment: Gait belt Activity Tolerance: Patient tolerated treatment well Patient left: in bed;with bed alarm set;with family/visitor present;with call bell/phone within reach Nurse Communication: Mobility status PT Visit Diagnosis: Repeated falls (R29.6);Muscle weakness (generalized) (M62.81);History of falling (Z91.81);Difficulty in walking, not elsewhere classified (R26.2);Pain Pain - Right/Left: Left Pain - part of body: Hip     Time: 1600-1630 PT Time Calculation (min) (ACUTE ONLY): 30 min  Charges:  $Therapeutic Activity: 23-37 mins                    Mikel Cella, PTA    [name] 01/20/2021, 4:55 PM

## 2021-01-20 NOTE — Progress Notes (Signed)
  Subjective:  Patient reports pain as mild.  Family in room. She is cheerful and able to follow commands.  Objective:   VITALS:   Vitals:   01/20/21 0358 01/20/21 0743 01/20/21 1116 01/20/21 1345  BP: (!) 154/80 138/88 136/81 115/67  Pulse: 96 79 92 95  Resp: 20 15 15 18   Temp: 97.8 F (36.6 C) (!) 97.3 F (36.3 C) 97.6 F (36.4 C) 98 F (36.7 C)  TempSrc:      SpO2: 98% 100% 92% 96%  Weight:      Height:        PHYSICAL EXAM:  Sensation intact distally Intact pulses distally Dorsiflexion/Plantar flexion intact Incision: dressing C/D/I No cellulitis present Compartment soft  LABS  Results for orders placed or performed during the hospital encounter of 01/17/21 (from the past 24 hour(s))  Glucose, capillary     Status: Abnormal   Collection Time: 01/19/21  9:30 PM  Result Value Ref Range   Glucose-Capillary 120 (H) 70 - 99 mg/dL    DG MINI C-ARM IMAGE ONLY  Result Date: 01/19/2021 There is no interpretation for this exam.  This order is for images obtained during a surgical procedure.  Please See "Surgeries" Tab for more information regarding the procedure.   DG HIP OPERATIVE UNILAT WITH PELVIS LEFT  Result Date: 01/19/2021 CLINICAL DATA:  Hip pinning EXAM: OPERATIVE left HIP (WITH PELVIS IF PERFORMED) 5 VIEWS TECHNIQUE: Fluoroscopic spot image(s) were submitted for interpretation post-operatively. COMPARISON:  01/17/2021 FINDINGS: Five low resolution intraoperative spot views of the left hip. Total fluoroscopy time was 27 seconds. The images demonstrate 3 threaded screw fixation of left femoral neck fracture IMPRESSION: Intraoperative fluoroscopic assistance provided during surgical fixation of left femoral neck fracture Electronically Signed   By: Donavan Foil M.D.   On: 01/19/2021 18:50    Assessment/Plan: 1 Day Post-Op   Principal Problem:   Fall Active Problems:   PAF (paroxysmal atrial fibrillation) (HCC)   Hypertension   Chronic diastolic congestive  heart failure (HCC)   Orthostatic hypotension   GERD without esophagitis   Hypothyroidism due to acquired atrophy of thyroid   Closed left hip fracture ([place])   Left elbow fracture   Up with therapy Discharge to SNF OK from ortho standpoint RTC in 2 weeks for staple removal and cast placement left arm WBAT Left lower, NWB left upper   Lovell Sheehan , MD 01/20/2021, 2:27 PM

## 2021-01-20 NOTE — TOC Progression Note (Signed)
Transition of Care [facility]) - Progression Note    Patient Details  Name: [name] MRN: [id number] Date of Birth: 17-Nov-1932  Transition of [facility]) CM/SW [place], RN Phone Number: 01/20/2021, 2:37 PM  Clinical Narrative:     Hilda Blades at [facility] has provided a bed offer, I notified the patient and called her Son Remo Lipps, They accepted the bed offer, I notified Hilda Blades at [facility] and requested that she start the [place],        Expected Discharge Plan and Services                                                 Social Determinants of Health (SDOH) Interventions    Readmission Risk Interventions Readmission Risk Prevention Plan 12/21/2020  Transportation Screening Complete  PCP or Specialist Appt within 3-5 Days Complete  HRI or Home Care Consult Complete  Social Work Consult for [place] Planning/Counseling Not Complete  SW consult not completed comments Patient is assigned to RN CM  Palliative Care Screening Not Applicable  Medication Review Press photographer) Complete  Some recent data might be hidden

## 2021-01-20 NOTE — Anesthesia Postprocedure Evaluation (Signed)
Anesthesia Post Note  Patient: [name]  Procedure(s) Performed: CLOSED REDUCTION HIP (Left: Hip) PERCUTANEOUS PINNING EXTREMITY (Left) OPEN REDUCTION INTERNAL FIXATION (ORIF) ELBOW/OLECRANON FRACTURE (Left: Elbow)  Patient location during evaluation: PACU Anesthesia Type: General Post-procedure mental status: baseline PreOp confusion. Pain management: pain level controlled Vital Signs Assessment: post-procedure vital signs reviewed and stable Respiratory status: spontaneous breathing, nonlabored ventilation, respiratory function stable and patient connected to nasal cannula oxygen Cardiovascular status: blood pressure returned to baseline and stable Postop Assessment: no apparent nausea or vomiting Anesthetic complications: no   No notable events documented.   Last Vitals:  Vitals:   01/19/21 2319 01/20/21 0358  BP: 116/72 (!) 154/80  Pulse: 84 96  Resp: 18 20  Temp: (!) 36.4 C 36.6 C  SpO2: 93% 98%    Last Pain:  Vitals:   01/19/21 2319  TempSrc: Oral  PainSc:                  Precious Haws Kamsiyochukwu Spickler

## 2021-01-20 NOTE — TOC Progression Note (Signed)
Transition of Care [facility]) - Progression Note    Patient Details  Name: [name] MRN: [id number] Date of Birth: 12-21-1932  Transition of Care [facility]) CM/SW [place], RN Phone Number: 01/20/2021, 1:19 PM  Clinical Narrative:    The patient is from the [facility] independent living facility, They are agreeable to go to [facility] SNF and prefer [facility] obtained, FL2 completed, bedsearch sent, will review bed offers oncce obtained        Expected Discharge Plan and Services                                                 Social Determinants of Health (SDOH) Interventions    Readmission Risk Interventions Readmission Risk Prevention Plan 12/21/2020  Transportation Screening Complete  PCP or Specialist Appt within 3-5 Days Complete  HRI or Home Care Consult Complete  Social Work Consult for [place] Planning/Counseling Not Complete  SW consult not completed comments Patient is assigned to RN CM  Palliative Care Screening Not Applicable  Medication Review Press photographer) Complete  Some recent data might be hidden

## 2021-01-21 DIAGNOSIS — W19XXXA Unspecified fall, initial encounter: Secondary | ICD-10-CM | POA: Diagnosis not present

## 2021-01-21 DIAGNOSIS — S42402A Unspecified fracture of lower end of left humerus, initial encounter for closed fracture: Secondary | ICD-10-CM | POA: Diagnosis not present

## 2021-01-21 DIAGNOSIS — S72002A Fracture of unspecified part of neck of left femur, initial encounter for closed fracture: Secondary | ICD-10-CM | POA: Diagnosis not present

## 2021-01-21 DIAGNOSIS — K219 Gastro-esophageal reflux disease without esophagitis: Secondary | ICD-10-CM | POA: Diagnosis not present

## 2021-01-21 LAB — CBC
HCT: 36.5 % (ref 36.0–46.0)
Hemoglobin: 12.1 g/dL (ref 12.0–15.0)
MCH: 35.3 pg — ABNORMAL HIGH (ref 26.0–34.0)
MCHC: 33.2 g/dL (ref 30.0–36.0)
MCV: 106.4 fL — ABNORMAL HIGH (ref 80.0–100.0)
Platelets: 177 10*3/uL (ref 150–400)
RBC: 3.43 MIL/uL — ABNORMAL LOW (ref 3.87–5.11)
RDW: 16.3 % — ABNORMAL HIGH (ref 11.5–15.5)
WBC: 14.7 10*3/uL — ABNORMAL HIGH (ref 4.0–10.5)
nRBC: 0 % (ref 0.0–0.2)

## 2021-01-21 MED ORDER — ENOXAPARIN SODIUM 30 MG/0.3ML IJ SOSY: 30.0000 mg | PREFILLED_SYRINGE | INTRAMUSCULAR | 0 refills | Status: DC

## 2021-01-21 MED ORDER — HYDROCODONE-ACETAMINOPHEN 5-325 MG PO TABS: 1.0000 | ORAL_TABLET | ORAL | 0 refills | Status: DC | PRN

## 2021-01-21 NOTE — Progress Notes (Signed)
Physical Therapy Treatment Patient Details Name: [name] MRN: [id number] DOB: 1932/08/16 Today's Date: 01/21/2021   History of Present Illness [name] is a 87 y.o. female with past medical history of COPD, CHF, HTN, HDL, GERD, hypothyroidism, paroxysmal A. fib on Eliquis, DVT and arthritis who presents coming by daughter for assessment of acute left elbow and left hip pain after a fall. 01/17/21 DG Pelvis: Acute mildly impacted subcapital fracture of the left femoral neck. 01/17/21 DG L elbow: Acute transverse fracture of the olecranon with proximal retraction and substantial rotation of the proximal fragment.    PT Comments    L UE dressing/bandage changed after drainage from skin tear was noted this am.  Pt's hand remains swollen with "slight" improvement noted.  Pt assisted with MaxA for rolling L<>R several times due to bowel incontinence. MaxA to transfer to EOB and use of bedding to perform activity slowly since pt unable to physically assist at this time. Pt sat EOB x 2 minutes while gaining upright sitting balance and correction of R lateral lean. Pt assisted to bedside recliner with MaxA to raise from bed and slowly shuffle feet prior to lowering to chair.  L UE elevated with ice packs, pt left with daughter to assist with lunch.  Attempted to have pt feed herself, however pt with poor R hand grip strength and motor control at this time.    Recommendations for follow up therapy are one component of a multi-disciplinary discharge planning process, led by the attending physician.  Recommendations may be updated based on patient status, additional functional criteria and insurance authorization.  Follow Up Recommendations  SNF     Equipment Recommendations  None recommended by PT    Recommendations for Other Services       Precautions / Restrictions Precautions Precautions: Fall Restrictions Weight Bearing Restrictions: Yes LUE Weight Bearing: Non weight bearing LLE Weight  Bearing: Weight bearing as tolerated     Mobility  Bed Mobility Overal bed mobility: Needs Assistance Bed Mobility: Sit to Supine           General bed mobility comments:  (Utilized bedding to gently slide pt to edge of bed)    Transfers Overall transfer level: Needs assistance Equipment used: None Transfers: Sit to/from [facility] Sit to Stand: Max assist Stand pivot transfers: Max assist;From elevated surface       General transfer comment:  (Heavy verbal cues due to impaired cognition from recent surgery.)  Ambulation/Gait                 Stairs             Wheelchair Mobility    Modified Rankin (Stroke Patients Only)       Balance Overall balance assessment: Needs assistance Sitting-balance support: Single extremity supported;Feet supported Sitting balance-[name]: Fair                                      Cognition Arousal/Alertness: Awake/alert;Lethargic Behavior During Therapy: WFL for tasks assessed/performed Overall Cognitive Status: History of cognitive impairments - at baseline                                 General Comments: oriented to self      Exercises      General Comments General comments (skin integrity, edema, etc.):  Ortho in to change bandage/dressing on L UE, splint remains tight at distal wrist area, will continue to monitor. Ice packs applied and extremity elevated once up in chair.      Pertinent Vitals/Pain Pain Assessment: Faces Faces Pain Scale: Hurts little more Pain Location:  (L hip) Pain Descriptors / Indicators: Discomfort;Grimacing Pain Intervention(s): Monitored during session    Home Living                      Prior Function            PT Goals (current goals can now be found in the care plan section) Acute Rehab PT Goals Patient Stated Goal: to feel better    Frequency    BID      PT Plan Current plan remains appropriate     Co-evaluation              AM-PAC PT "6 Clicks" Mobility   Outcome Measure  Help needed turning from your back to your side while in a flat bed without using bedrails?: A Lot Help needed moving from lying on your back to sitting on the side of a flat bed without using bedrails?: A Lot Help needed moving to and from a bed to a chair (including a wheelchair)?: Total Help needed standing up from a chair using your arms (e.g., wheelchair or bedside chair)?: Total Help needed to walk in hospital room?: Total Help needed climbing 3-5 steps with a railing? : Total 6 Click Score: 8    End of Session Equipment Utilized During Treatment: Gait belt Activity Tolerance: Patient limited by fatigue Patient left: in chair;with call bell/phone within reach;with family/visitor present Nurse Communication: Mobility status PT Visit Diagnosis: Repeated falls (R29.6);Muscle weakness (generalized) (M62.81);History of falling (Z91.81);Difficulty in walking, not elsewhere classified (R26.2);Pain Pain - Right/Left: Left Pain - part of body: Hip     Time: 9604-5409 PT Time Calculation (min) (ACUTE ONLY): 48 min  Charges:  $Therapeutic Activity: 38-52 mins                    Mikel Cella, PTA   [name] 01/21/2021, 2:35 PM

## 2021-01-21 NOTE — TOC Progression Note (Signed)
Transition of Care [facility]) - Progression Note    Patient Details  Name: Victorian Gunn Asbridge MRN: [id number] Date of Birth: September 22, 1932  Transition of Care [facility]) CM/SW [place] M. [place], RN Phone Number: 01/21/2021, 10:52 AM  Clinical Narrative:    Met witht he patient and her Daughter Jodie in the room at the bedside, Explained to [place] that the patient had accepted a room at [facility] and that the [facility] is pending, Answered her questions concerning what happens typically at rehab and what to expect prior to going home from [place], [place] stated understanding, TOC will continue to assist with DC needs        Expected Discharge Plan and Services                                                 Social Determinants of Health (SDOH) Interventions    Readmission Risk Interventions Readmission Risk Prevention Plan 12/21/2020  Transportation Screening Complete  PCP or Specialist Appt within 3-5 Days Complete  HRI or Home Care Consult Complete  Social Work Consult for [place] Planning/Counseling Not Complete  SW consult not completed comments Patient is assigned to RN [facility]  Palliative Care Screening Not Applicable  Medication Review (RN Care Manager) Complete  Some recent data might be hidden

## 2021-01-21 NOTE — Plan of Care (Signed)

## 2021-01-21 NOTE — Progress Notes (Signed)
Physical Therapy Treatment Patient Details Name: [name] MRN: [id number] DOB: Jul 15, 1932 Today's Date: 01/21/2021   History of Present Illness [name] is a 87 y.o. female with past medical history of COPD, CHF, HTN, HDL, GERD, hypothyroidism, paroxysmal A. fib on Eliquis, DVT and arthritis who presents coming by daughter for assessment of acute left elbow and left hip pain after a fall. 01/17/21 DG Pelvis: Acute mildly impacted subcapital fracture of the left femoral neck. 01/17/21 DG L elbow: Acute transverse fracture of the olecranon with proximal retraction and substantial rotation of the proximal fragment.    PT Comments    Pt seen this am for bedside A/AAROM B LE's x 15-20 reps x 2 sets. Continued education provided to pt's daughter regarding goals and POC.  Pt's left hand with increased edema noted from previous day and significant drainage on pillow at elbow. Pt's ace wrap loosened, however hard splint appears to be too tight. Nursing/Ortho aware and will redress L UE.  Will plan to assist pt with OOB activity after splint addressed and pt is less lethargic this afternoon.  Recommendations for follow up therapy are one component of a multi-disciplinary discharge planning process, led by the attending physician.  Recommendations may be updated based on patient status, additional functional criteria and insurance authorization.  Follow Up Recommendations  SNF     Equipment Recommendations  None recommended by PT    Recommendations for Other Services       Precautions / Restrictions Precautions Precautions: Fall Restrictions Weight Bearing Restrictions: Yes LUE Weight Bearing: Non weight bearing LLE Weight Bearing: Weight bearing as tolerated Other Position/Activity Restrictions:  (Ortho stated arm sling can be worn for comfort if needed)     Mobility  Bed Mobility               General bed mobility comments:  (Deferred due to awaiting L UE splint to be redressed  and pt lethargic)    Transfers                    Ambulation/Gait                 Stairs             Wheelchair Mobility    Modified Rankin (Stroke Patients Only)       Balance                                            Cognition Arousal/Alertness: Lethargic Behavior During Therapy:  (Very sleepy, eyes closed through most of session, responds to vc's) Overall Cognitive Status: History of cognitive impairments - at baseline                                 General Comments: oriented to self      Exercises General Exercises - Lower Extremity Ankle Circles/Pumps: AROM;Both;20 reps;Supine Heel Slides: AAROM;Both;15 reps Hip ABduction/ADduction: AAROM;Both;15 reps Other Exercises Other Exercises:  (AAROM hip IR/ER x 15)    General Comments General comments (skin integrity, edema, etc.):  (Left hand with increased edema from previous day and significant drainage at elbow area. Nursing/Ortho aware)      Pertinent Vitals/Pain Pain Assessment: Faces Faces Pain Scale: Hurts little more Pain Location:  (L Hip) Pain Descriptors / Indicators: Discomfort;Grimacing Pain  Intervention(s): Monitored during session    Home Living                      Prior Function            PT Goals (current goals can now be found in the care plan section) Acute Rehab PT Goals Patient Stated Goal: to feel better    Frequency    BID      PT Plan Current plan remains appropriate    Co-evaluation              AM-PAC PT "6 Clicks" Mobility   Outcome Measure  Help needed turning from your back to your side while in a flat bed without using bedrails?: A Lot Help needed moving from lying on your back to sitting on the side of a flat bed without using bedrails?: A Lot Help needed moving to and from a bed to a chair (including a wheelchair)?: Total Help needed standing up from a chair using your arms (e.g.,  wheelchair or bedside chair)?: Total Help needed to walk in hospital room?: Total Help needed climbing 3-5 steps with a railing? : Total 6 Click Score: 8    End of Session   Activity Tolerance: Patient limited by fatigue Patient left: in bed;with call bell/phone within reach;with bed alarm set;with family/visitor present   PT Visit Diagnosis: Repeated falls (R29.6);Muscle weakness (generalized) (M62.81);History of falling (Z91.81);Difficulty in walking, not elsewhere classified (R26.2);Pain Pain - Right/Left: Left Pain - part of body: Hip     Time: 8638-1771 PT Time Calculation (min) (ACUTE ONLY): 31 min  Charges:  $Therapeutic Exercise: 23-37 mins                    Mikel Cella, PTA   [name] 01/21/2021, 10:50 AM

## 2021-01-21 NOTE — Progress Notes (Addendum)
[place] [facility] [place] NAME: [name]    MR#:  [id number]  DATE OF BIRTH:  Sep 07, 1932  SUBJECTIVE:  Pt is more awake and pleasant Patient apparently had a mechanical fall at [place].  POD #2 pleasant confusion but answered most questions appropriately REVIEW OF SYSTEMS:   Review of Systems  Unable to perform ROS: Mental status change  Tolerating Diet:yes Tolerating PT: SNF  DRUG ALLERGIES:   Allergies  Allergen Reactions   Other Other (See Comments)    NO BP, VENIPUNCTURE, OR ACCESS IN RIGHT ARM - S/P MASTECTOMY ON RIGHT   Amlodipine Nausea And Vomiting   Ciprofloxacin Nausea Only   Doxycycline Itching and Other (See Comments)    shaky   Lipitor [Atorvastatin] Other (See Comments)    Joint pain    Morphine And Related Nausea And Vomiting   Macrobid [Nitrofurantoin Macrocrystal] Rash   Penicillins Rash and Other (See Comments)    Has patient had a PCN reaction causing immediate rash, facial/tongue/throat swelling, SOB or lightheadedness with hypotension: yes Has patient had a PCN reaction causing severe rash involving mucus membranes or skin necrosis: no Has patient had a PCN reaction that required hospitalization no Has patient had a PCN reaction occurring within the last 10 years: no If all of the above answers are "NO", then may proceed with Cephalosporin use.    Sulfa Antibiotics Rash    VITALS:  Blood pressure (!) 155/88, pulse 99, temperature 97.7 F (36.5 C), resp. rate 15, height 5\' 8"  (1.727 m), weight 68.2 kg, SpO2 91 %.  PHYSICAL EXAMINATION:   Physical Exam  GENERAL:  87 y.o.-year-old patient lying in the bed with no acute distress.  LUNGS: Normal breath sounds bilaterally, no wheezing, rales, rhonchi. No use of accessory muscles of respiration.  CARDIOVASCULAR: S1, S2 normal. No murmurs, rubs, or gallops.  ABDOMEN: Soft, nontender, nondistended. Bowel sounds present. No organomegaly or mass.  EXTREMITIES:  left UE ace wrap, hand swollen--good sensation NEUROLOGIC: grossly non focal   PSYCHIATRIC:  patient is alert and awake SKIN: No obvious rash, lesion, or ulcer.   LABORATORY PANEL:  CBC Recent Labs  Lab 01/21/21 0544  WBC 14.7*  HGB 12.1  HCT 36.5  PLT 177     Chemistries  Recent Labs  Lab 01/19/21 0317  NA 139  K 3.9  CL 104  CO2 26  GLUCOSE 106*  BUN 19  CREATININE 1.42*  CALCIUM 8.4*    Cardiac Enzymes No results for input(s): TROPONINI in the last 168 hours. RADIOLOGY:  DG MINI C-ARM IMAGE ONLY  Result Date: 01/19/2021 There is no interpretation for this exam.  This order is for images obtained during a surgical procedure.  Please See "Surgeries" Tab for more information regarding the procedure.   DG HIP OPERATIVE UNILAT WITH PELVIS LEFT  Result Date: 01/19/2021 CLINICAL DATA:  Hip pinning EXAM: OPERATIVE left HIP (WITH PELVIS IF PERFORMED) 5 VIEWS TECHNIQUE: Fluoroscopic spot image(s) were submitted for interpretation post-operatively. COMPARISON:  01/17/2021 FINDINGS: Five low resolution intraoperative spot views of the left hip. Total fluoroscopy time was 27 seconds. The images demonstrate 3 threaded screw fixation of left femoral neck fracture IMPRESSION: Intraoperative fluoroscopic assistance provided during surgical fixation of left femoral neck fracture Electronically Signed   By: Donavan Foil M.D.   On: 01/19/2021 18:50   ASSESSMENT AND PLAN:  [name] is an 87 year old female with mild Alzheimer's dementia who lives in independent living, paroxysmal atrial fibrillation,  chronic kidney disease, hypertension who presents to the hospital after a fall in the bathroom at [place]. She is found to have fracture of her left olecranon and left subcapital femoral neck #   Fall   Closed left subcapital femoral neck fracture (HCC)   Left elbow/olecranon fracture -Orthopedic surgery, Dr. Harlow Mares has been consulted and will be assisting with management - Left  arm is in a sling --prn pain meds --10/6--POD #1 hip and elbow surgery  --10/7 POD #2 left UE swelling +--evaluated by ortho--dressing changed, Ace loosened  Acute onset confusion - UA reveals that there is no UTI - Continue to follow for delirium   Leukocytosis - no infection noted- possibly acute stress response     PAF (paroxysmal atrial fibrillation) ([place]) -Per, the patient's dter  pt has stopped taking Eliquis sometime ago when she was found to have a low hemoglobin and Hemoccult positive stools - Continue amiodarone and Toprol   Diarrhea--chronic due to Collagenous colitis - Recently placed on budesonide by GI - As mentioned above, she was recently found to be anemic with a hemoglobin of 6 and found to have heme positive stool --hgb stable at 12.3 --no rectal bleeding noted    Hypertension  Chronic diastolic congestive heart failure  holding torsemide  resumed spirnolactone  Hypothyroidism - Continue levothyroxine     DVT prophylaxis: lovenox Code Status: DNR per dter [name] Family Communication: Daughter at bedside Level of Care: Level of care: Med-Surg Disposition Plan:  Status is: Inpatient   Remains inpatient appropriate because:IV treatments appropriate due to intensity of illness or inability to take PO   Dispo: The patient is from: Home              Anticipated d/c is to: rehab              Patient currently is medically stable to d/c--awaiting insurance auth. Family [facility] at [facility]              Difficult to place patient No    TOTAL TIME TAKING CARE OF THIS PATIENT: 25 minutes.  >50% time spent on counselling and coordination of care  Note: This dictation was prepared with Dragon dictation along with smaller phrase technology. Any transcriptional errors that result from this process are unintentional.  Fritzi Mandes M.D    [facility]   CC: Primary care physician; Rusty Aus, MD Patient ID: [name], female   DOB: 05/31/1932, 87  y.o.   MRN: [id number]

## 2021-01-21 NOTE — Care Management Important Message (Signed)
Important Message  Patient Details  Name: [name] MRN: [id number] Date of Birth: 18-Dec-1932   Medicare Important Message Given:  Yes     Cordarius Benning, Leroy Sea 01/21/2021, 1:21 PM

## 2021-01-21 NOTE — Progress Notes (Addendum)
  Subjective:  Patient reports pain as mild to moderate.    Objective:   VITALS:   Vitals:   01/20/21 2045 01/20/21 2358 01/21/21 0408 01/21/21 0745  BP: (!) 147/81 (!) 146/77 (!) 148/85 (!) 155/88  Pulse: 86 94 95 99  Resp: 14 14 16 15   Temp: 98.3 F (36.8 C) 98 F (36.7 C) 97.7 F (36.5 C) 97.7 F (36.5 C)  TempSrc: Oral Oral Oral   SpO2: 92% 95% 91% 91%  Weight:      Height:        PHYSICAL EXAM:  Neurologically intact ABD soft Neurovascular intact Sensation intact distally Intact pulses distally Dorsiflexion/Plantar flexion intact Incision: dressing C/D/I No cellulitis present Compartment soft  LABS  Results for orders placed or performed during the hospital encounter of 01/17/21 (from the past 24 hour(s))  CBC     Status: Abnormal   Collection Time: 01/21/21  5:44 AM  Result Value Ref Range   WBC 14.7 (H) 4.0 - 10.5 K/uL   RBC 3.43 (L) 3.87 - 5.11 MIL/uL   Hemoglobin 12.1 12.0 - 15.0 g/dL   HCT 36.5 36.0 - 46.0 %   MCV 106.4 (H) 80.0 - 100.0 fL   MCH 35.3 (H) 26.0 - 34.0 pg   MCHC 33.2 30.0 - 36.0 g/dL   RDW 16.3 (H) 11.5 - 15.5 %   Platelets 177 150 - 400 K/uL   nRBC 0.0 0.0 - 0.2 %    DG MINI C-ARM IMAGE ONLY  Result Date: 01/19/2021 There is no interpretation for this exam.  This order is for images obtained during a surgical procedure.  Please See "Surgeries" Tab for more information regarding the procedure.   DG HIP OPERATIVE UNILAT WITH PELVIS LEFT  Result Date: 01/19/2021 CLINICAL DATA:  Hip pinning EXAM: OPERATIVE left HIP (WITH PELVIS IF PERFORMED) 5 VIEWS TECHNIQUE: Fluoroscopic spot image(s) were submitted for interpretation post-operatively. COMPARISON:  01/17/2021 FINDINGS: Five low resolution intraoperative spot views of the left hip. Total fluoroscopy time was 27 seconds. The images demonstrate 3 threaded screw fixation of left femoral neck fracture IMPRESSION: Intraoperative fluoroscopic assistance provided during surgical fixation of  left femoral neck fracture Electronically Signed   By: Donavan Foil M.D.   On: 01/19/2021 18:50    Assessment/Plan: 2 Days Post-Op   Principal Problem:   Fall Active Problems:   PAF (paroxysmal atrial fibrillation) (HCC)   Hypertension   Chronic diastolic congestive heart failure (HCC)   Orthostatic hypotension   GERD without esophagitis   Hypothyroidism due to acquired atrophy of thyroid   Closed left hip fracture ([place])   Left elbow fracture   Advance diet Up with therapy  Discharge to SNF OK from ortho standpoint RTC 2 weeks for staple removal and cast placement left arm (02/01/21) Call office to confirm appointment [phone] WBAT LLE, NWB left upper Lovenox 30 mg daily X 2 weeks Hydrocodone for pain prn, rx in chart   Carlynn Spry , PA-C 01/21/2021, 8:11 AM

## 2021-01-21 NOTE — Progress Notes (Signed)
  Subjective:  Patient reports pain as mild.  Came to see patient for drsg check for elbow.  Reports of drainage.  Objective:   VITALS:   Vitals:   01/20/21 2045 01/20/21 2358 01/21/21 0408 01/21/21 0745  BP: (!) 147/81 (!) 146/77 (!) 148/85 (!) 155/88  Pulse: 86 94 95 99  Resp: 14 14 16 15   Temp: 98.3 F (36.8 C) 98 F (36.7 C) 97.7 F (36.5 C) 97.7 F (36.5 C)  TempSrc: Oral Oral Oral   SpO2: 92% 95% 91% 91%  Weight:      Height:        PHYSICAL EXAM:  Neurologically intact ABD soft Neurovascular intact Sensation intact distally Intact pulses distally Incision: moderate drainage and drsg and splint taken  down and wound cleaned.  Bleeding from skin tear not incision.  New drsg and splint applied.Ace wrap and arm elevated.  Encouraged fist pumping. No cellulitis present Compartment soft  LABS  Results for orders placed or performed during the hospital encounter of 01/17/21 (from the past 24 hour(s))  CBC     Status: Abnormal   Collection Time: 01/21/21  5:44 AM  Result Value Ref Range   WBC 14.7 (H) 4.0 - 10.5 K/uL   RBC 3.43 (L) 3.87 - 5.11 MIL/uL   Hemoglobin 12.1 12.0 - 15.0 g/dL   HCT 36.5 36.0 - 46.0 %   MCV 106.4 (H) 80.0 - 100.0 fL   MCH 35.3 (H) 26.0 - 34.0 pg   MCHC 33.2 30.0 - 36.0 g/dL   RDW 16.3 (H) 11.5 - 15.5 %   Platelets 177 150 - 400 K/uL   nRBC 0.0 0.0 - 0.2 %    DG MINI C-ARM IMAGE ONLY  Result Date: 01/19/2021 There is no interpretation for this exam.  This order is for images obtained during a surgical procedure.  Please See "Surgeries" Tab for more information regarding the procedure.   DG HIP OPERATIVE UNILAT WITH PELVIS LEFT  Result Date: 01/19/2021 CLINICAL DATA:  Hip pinning EXAM: OPERATIVE left HIP (WITH PELVIS IF PERFORMED) 5 VIEWS TECHNIQUE: Fluoroscopic spot image(s) were submitted for interpretation post-operatively. COMPARISON:  01/17/2021 FINDINGS: Five low resolution intraoperative spot views of the left hip. Total  fluoroscopy time was 27 seconds. The images demonstrate 3 threaded screw fixation of left femoral neck fracture IMPRESSION: Intraoperative fluoroscopic assistance provided during surgical fixation of left femoral neck fracture Electronically Signed   By: Donavan Foil M.D.   On: 01/19/2021 18:50    Assessment/Plan: 2 Days Post-Op   Principal Problem:   Fall Active Problems:   PAF (paroxysmal atrial fibrillation) (HCC)   Hypertension   Chronic diastolic congestive heart failure (HCC)   Orthostatic hypotension   GERD without esophagitis   Hypothyroidism due to acquired atrophy of thyroid   Closed left hip fracture ([place])   Left elbow fracture   Advance diet Up with therapy Keep left upper extremity elevated.   Discharge to SNF OK from ortho standpoint RTC 2 weeks for staple removal and cast placement left arm (02/01/21) Call office to confirm appointment [phone] WBAT LLE, NWB left upper Lovenox 30 mg daily X 2 weeks Hydrocodone for pain prn, rx in chart  Carlynn Spry , PA-C 01/21/2021, 11:40 AM

## 2021-01-22 DIAGNOSIS — S72002A Fracture of unspecified part of neck of left femur, initial encounter for closed fracture: Secondary | ICD-10-CM | POA: Diagnosis not present

## 2021-01-22 DIAGNOSIS — S42402A Unspecified fracture of lower end of left humerus, initial encounter for closed fracture: Secondary | ICD-10-CM | POA: Diagnosis not present

## 2021-01-22 DIAGNOSIS — W19XXXA Unspecified fall, initial encounter: Secondary | ICD-10-CM | POA: Diagnosis not present

## 2021-01-22 DIAGNOSIS — K219 Gastro-esophageal reflux disease without esophagitis: Secondary | ICD-10-CM | POA: Diagnosis not present

## 2021-01-22 NOTE — Plan of Care (Signed)

## 2021-01-22 NOTE — Progress Notes (Signed)
Subjective: 3 Days Post-Op Procedure(s) (LRB): CLOSED REDUCTION HIP (Left) PERCUTANEOUS PINNING EXTREMITY (Left) OPEN REDUCTION INTERNAL FIXATION (ORIF) ELBOW/OLECRANON FRACTURE (Left)   Patient is lying comfortably in bed today.  She is awake and alert.  She has mild confusion.  Left elbow is in a long-arm splint with no drainage present.  There are some edema in the hand and I rewrapped the Ace bandages for this.  Left hip has minimal pain.  She says she has been up with therapy.  Hemoglobin is stable. Patient reports pain as mild.  Objective:   VITALS:   Vitals:   01/22/21 0455 01/22/21 0800  BP: (!) 160/97 (!) 162/100  Pulse: 97 99  Resp: 16 17  Temp: 97.8 F (36.6 C) 98.1 F (36.7 C)  SpO2: 100% 99%    Neurologically intact Incision: dressing C/D/I  LABS Recent Labs    01/21/21 0544  HGB 12.1  HCT 36.5  WBC 14.7*  PLT 177    No results for input(s): NA, K, BUN, CREATININE, GLUCOSE in the last 72 hours.  No results for input(s): LABPT, INR in the last 72 hours.   Assessment/Plan: 3 Days Post-Op Procedure(s) (LRB): CLOSED REDUCTION HIP (Left) PERCUTANEOUS PINNING EXTREMITY (Left) OPEN REDUCTION INTERNAL FIXATION (ORIF) ELBOW/OLECRANON FRACTURE (Left)   Advance diet Up with therapy Discharge to SNF

## 2021-01-22 NOTE — Progress Notes (Signed)
Physical Therapy Treatment Patient Details Name: [name] MRN: [id number] DOB: 04-27-1932 Today's Date: 01/22/2021   History of Present Illness [name] is a 87 y.o. female with past medical history of COPD, CHF, HTN, HDL, GERD, hypothyroidism, paroxysmal A. fib on Eliquis, DVT and arthritis who presents coming by daughter for assessment of acute left elbow and left hip pain after a fall. 01/17/21 DG Pelvis: Acute mildly impacted subcapital fracture of the left femoral neck. 01/17/21 DG L elbow: Acute transverse fracture of the olecranon with proximal retraction and substantial rotation of the proximal fragment.    PT Comments    Pt was sitting on BSC upon arriving. She is slightly agitated. Author questions if starting to sun down. She required +2 assistance to stand pivot back to bed form BSC. Max assist to return to supine from EOB short sit. She was repositioned with pillow support at conclusion of session. Session very limited this afternoon. Did educate pt on importance of performing there ex for circulation and strengthening. States understanding but unwilling to perform. Pt will need SNF at DC to maximize independence with ADLs. Acute PT will continue to follow as able per current POC.    Recommendations for follow up therapy are one component of a multi-disciplinary discharge planning process, led by the attending physician.  Recommendations may be updated based on patient status, additional functional criteria and insurance authorization.  Follow Up Recommendations  SNF     Equipment Recommendations  None recommended by PT       Precautions / Restrictions Precautions Precautions: Fall Restrictions Weight Bearing Restrictions: Yes LUE Weight Bearing: Non weight bearing LLE Weight Bearing: Weight bearing as tolerated Other Position/Activity Restrictions: Ortho stated arm sling can be worn for comfort if needed     Mobility  Bed Mobility Overal bed mobility: Needs  Assistance Bed Mobility: Sit to Supine     Supine to sit: Max assist;HOB elevated Sit to supine: Max assist;HOB elevated   General bed mobility comments: pt required max assist to return from sitting to return to supine in bed. Vcs for technique. author questions pt's effort. She was slightly agitated throughout this session    Transfers Overall transfer level: Needs assistance Equipment used: 2 person hand held assist Transfers: Stand Pivot Transfers Sit to Stand: Max assist;From elevated surface Stand pivot transfers: Max assist;+2 physical assistance;+2 safety/equipment       General transfer comment: pt requird max assist of one to stand however +2 max assist to achieve pivot back to bed form BSC. max vcs throughout for technique and cueing  Ambulation/Gait      General Gait Details: pt was able to pivot but not able to actually take steps 2/2 to pain and fatigue    Balance Overall balance assessment: Needs assistance Sitting-balance support: Single extremity supported;Feet supported Sitting balance-[name]: Poor Sitting balance - Comments: F initially, becomes P with fatigue and pain, pt starts to lean to R side to offset weight from L hip   Standing balance support: Single extremity supported Standing balance-[name]: Poor Standing balance comment: extremely high fall risk       Cognition Arousal/Alertness: Awake/alert Behavior During Therapy:  (slightly agitated. sun downing?) Overall Cognitive Status: History of cognitive impairments - at baseline            General Comments: Pt initially sweet and particpatory, gradually becomes more agitated throughout session, possibly sun-downing. Requires increased time to process most commands.      Exercises Other Exercises  Other Exercises: OT engages pt in commode transfer and toileting tasks.        Pertinent Vitals/Pain Pain Assessment: 0-10 Pain Score: 7  Faces Pain Scale: Hurts even more Pain  Location: LUE/LLE with mobilization Pain Descriptors / Indicators: Discomfort;Grimacing Pain Intervention(s): Limited activity within patient's tolerance;Monitored during session;Premedicated before session;Repositioned     PT Goals (current goals can now be found in the care plan section) Acute Rehab PT Goals Patient Stated Goal: none stated Progress towards PT goals: Progressing toward goals    Frequency    BID      PT Plan Current plan remains appropriate       AM-PAC PT "6 Clicks" Mobility   Outcome Measure  Help needed turning from your back to your side while in a flat bed without using bedrails?: A Lot Help needed moving from lying on your back to sitting on the side of a flat bed without using bedrails?: A Lot Help needed moving to and from a bed to a chair (including a wheelchair)?: Total Help needed standing up from a chair using your arms (e.g., wheelchair or bedside chair)?: Total Help needed to walk in hospital room?: Total Help needed climbing 3-5 steps with a railing? : Total 6 Click Score: 8    End of Session Equipment Utilized During Treatment: Gait belt Activity Tolerance: Patient limited by fatigue;Patient limited by pain;Other (comment) (agitated...sun downing?) Patient left: in bed;with call bell/phone within reach;with bed alarm set Nurse Communication: Mobility status PT Visit Diagnosis: Repeated falls (R29.6);Muscle weakness (generalized) (M62.81);History of falling (Z91.81);Difficulty in walking, not elsewhere classified (R26.2);Pain Pain - Right/Left: Left Pain - part of body: Hip     Time: 1452-1500 PT Time Calculation (min) (ACUTE ONLY): 8 min  Charges:  $Therapeutic Activity: 8-22 mins                     Julaine Fusi PTA 01/22/21, 5:08 PM

## 2021-01-22 NOTE — Progress Notes (Signed)
Physical Therapy Treatment Patient Details Name: [name] MRN: [id number] DOB: June 30, 1932 Today's Date: 01/22/2021   History of Present Illness [name] is a 87 y.o. female with past medical history of COPD, CHF, HTN, HDL, GERD, hypothyroidism, paroxysmal A. fib on Eliquis, DVT and arthritis who presents coming by daughter for assessment of acute left elbow and left hip pain after a fall. 01/17/21 DG Pelvis: Acute mildly impacted subcapital fracture of the left femoral neck. 01/17/21 DG L elbow: Acute transverse fracture of the olecranon with proximal retraction and substantial rotation of the proximal fragment.    PT Comments    Pt was long sitting in bed, lethargic, upon arriving. She is awake and conversational however extremely flat affect. Needs increased time to process and assistance to initiate any/all movements. Does have severe pain throughout session with movements on LUE/LLE. Unable to recall NWB LUE but did adhere to it throughout session with assistance. Max assist to exit bed, stand 2 x and then to return to bed. Pt is very weak and will need extensive PT going forward. Highly recommend DC to SNF to address deficits while maximizing independence with ADLs. Will return in PM to perform there ex to promote strengthening and improved abilities.    Recommendations for follow up therapy are one component of a multi-disciplinary discharge planning process, led by the attending physician.  Recommendations may be updated based on patient status, additional functional criteria and insurance authorization.  Follow Up Recommendations  SNF     Equipment Recommendations  None recommended by PT       Precautions / Restrictions Precautions Precautions: Fall Restrictions Weight Bearing Restrictions: Yes LUE Weight Bearing: Non weight bearing LLE Weight Bearing: Weight bearing as tolerated     Mobility  Bed Mobility Overal bed mobility: Needs Assistance Bed Mobility: Supine to  Sit;Sit to Supine     Supine to sit: Max assist;HOB elevated Sit to supine: Max assist;HOB elevated   General bed mobility comments: max assist to exit and return to bed. severely limited by pain throughout.    Transfers Overall transfer level: Needs assistance Equipment used: None Transfers: Sit to/from Stand Sit to Stand: Max assist         General transfer comment: Pt was able to stand 2 x from elevated bed height with max assist. Attempted to use hemiwalker in rm however did better with author blocking knees and being supportd by Chief Strategy Officer.  Ambulation/Gait    General Gait Details: unsafe to trial    Balance Overall balance assessment: Needs assistance Sitting-balance support: Single extremity supported;Feet supported Sitting balance-[name]: Fair       Cognition Arousal/Alertness: Awake/alert;Lethargic Behavior During Therapy: Flat affect (not very social) Overall Cognitive Status: History of cognitive impairments - at baseline      General Comments: oriented to self and hospital however not to time or full scope of situation         General Comments General comments (skin integrity, edema, etc.): pt was too fatigue and lethargic after standing trials to advance to performing ther ex. Will return this afternoon and perform ther ex      Pertinent Vitals/Pain Pain Assessment: Faces Faces Pain Scale: Hurts whole lot Pain Location: LUE/LLE Pain Descriptors / Indicators: Discomfort;Grimacing Pain Intervention(s): Limited activity within patient's tolerance;Monitored during session;Premedicated before session;Repositioned     PT Goals (current goals can now be found in the care plan section) Acute Rehab PT Goals Patient Stated Goal: none stated Progress towards PT goals: Progressing  toward goals    Frequency    BID      PT Plan Current plan remains appropriate       AM-PAC PT "6 Clicks" Mobility   Outcome Measure  Help needed turning from your  back to your side while in a flat bed without using bedrails?: A Lot Help needed moving from lying on your back to sitting on the side of a flat bed without using bedrails?: A Lot Help needed moving to and from a bed to a chair (including a wheelchair)?: Total Help needed standing up from a chair using your arms (e.g., wheelchair or bedside chair)?: Total Help needed to walk in hospital room?: Total Help needed climbing 3-5 steps with a railing? : Total 6 Click Score: 8    End of Session Equipment Utilized During Treatment: Gait belt Activity Tolerance: Patient limited by fatigue Patient left: in chair;with call bell/phone within reach;with family/visitor present Nurse Communication: Mobility status PT Visit Diagnosis: Repeated falls (R29.6);Muscle weakness (generalized) (M62.81);History of falling (Z91.81);Difficulty in walking, not elsewhere classified (R26.2);Pain Pain - Right/Left: Left Pain - part of body: Hip     Time: 1055-1130 PT Time Calculation (min) (ACUTE ONLY): 35 min  Charges:  $Therapeutic Activity: 23-37 mins                     Julaine Fusi PTA 01/22/21, 12:17 PM

## 2021-01-22 NOTE — Progress Notes (Signed)
[place] [facility] [place] NAME: [name]    MR#:  [id number]  DATE OF BIRTH:  10/30/1932  SUBJECTIVE:  Pt is more awake and pleasant Patient apparently had a mechanical fall at [place].  POD #3 pleasant confusion but answered most questions appropriately  Left UE swelling little better REVIEW OF SYSTEMS:   Review of Systems  Unable to perform ROS: Mental status change  Tolerating Diet:yes Tolerating PT: SNF  DRUG ALLERGIES:   Allergies  Allergen Reactions   Other Other (See Comments)    NO BP, VENIPUNCTURE, OR ACCESS IN RIGHT ARM - S/P MASTECTOMY ON RIGHT   Amlodipine Nausea And Vomiting   Ciprofloxacin Nausea Only   Doxycycline Itching and Other (See Comments)    shaky   Lipitor [Atorvastatin] Other (See Comments)    Joint pain    Morphine And Related Nausea And Vomiting   Macrobid [Nitrofurantoin Macrocrystal] Rash   Penicillins Rash and Other (See Comments)    Has patient had a PCN reaction causing immediate rash, facial/tongue/throat swelling, SOB or lightheadedness with hypotension: yes Has patient had a PCN reaction causing severe rash involving mucus membranes or skin necrosis: no Has patient had a PCN reaction that required hospitalization no Has patient had a PCN reaction occurring within the last 10 years: no If all of the above answers are "NO", then may proceed with Cephalosporin use.    Sulfa Antibiotics Rash    VITALS:  Blood pressure (!) 162/100, pulse 99, temperature 98.1 F (36.7 C), temperature source Axillary, resp. rate 17, height 5\' 8"  (1.727 m), weight 68.2 kg, SpO2 99 %.  PHYSICAL EXAMINATION:   Physical Exam  GENERAL:  87 y.o.-year-old patient lying in the bed with no acute distress.  LUNGS: Normal breath sounds bilaterally, no wheezing, rales, rhonchi. No use of accessory muscles of respiration.  CARDIOVASCULAR: S1, S2 normal. No murmurs, rubs, or gallops.  ABDOMEN: Soft, nontender, nondistended.  Bowel sounds present. No organomegaly or mass.  EXTREMITIES: left UE ace wrap, hand swollen--good sensation NEUROLOGIC: grossly non focal   PSYCHIATRIC:  patient is alert and awake SKIN: No obvious rash, lesion, or ulcer.   LABORATORY PANEL:  CBC Recent Labs  Lab 01/21/21 0544  WBC 14.7*  HGB 12.1  HCT 36.5  PLT 177     Chemistries  Recent Labs  Lab 01/19/21 0317  NA 139  K 3.9  CL 104  CO2 26  GLUCOSE 106*  BUN 19  CREATININE 1.42*  CALCIUM 8.4*    Cardiac Enzymes No results for input(s): TROPONINI in the last 168 hours. RADIOLOGY:  No results found. ASSESSMENT AND PLAN:  [name] is an 87 year old female with mild Alzheimer's dementia who lives in independent living, paroxysmal atrial fibrillation, chronic kidney disease, hypertension who presents to the hospital after a fall in the bathroom at [place]. She is found to have fracture of her left olecranon and left subcapital femoral neck #   Fall   Closed left subcapital femoral neck fracture (HCC)   Left elbow/olecranon fracture -Orthopedic surgery, Dr. Harlow Mares has been consulted and will be assisting with management - Left arm is in a sling --prn pain meds --10/6--POD #1 hip and elbow surgery  --10/7 POD #2 left UE swelling +--evaluated by ortho--dressing changed, Ace loosened --10/8 POD #3 Dr Sabra Heck rewrapped the left UE  Acute onset confusion - UA reveals that there is no UTI - Continue to follow for delirium   Leukocytosis - no  infection noted- possibly acute stress response     PAF (paroxysmal atrial fibrillation) (HCC) -Per, the patient's dter  pt has stopped taking Eliquis sometime ago when she was found to have a low hemoglobin and Hemoccult positive stools - Continue amiodarone and Toprol   Diarrhea--chronic due to Collagenous colitis - Recently placed on budesonide by GI - As mentioned above, she was recently found to be anemic with a hemoglobin of 6 and found to have heme positive  stool --hgb stable at 12.3 --no rectal bleeding noted    Hypertension  Chronic diastolic congestive heart failure  holding torsemide  resumed spirnolactone  Hypothyroidism - Continue levothyroxine     DVT prophylaxis: lovenox Code Status: DNR per dter Peggye Fothergill Family Communication: none today Level of Care: Level of care: Med-Surg Disposition Plan:  Status is: Inpatient     Dispo: The patient is from: Home              Anticipated d/c is to: rehab              Patient currently is medically stable to d/c--awaiting insurance auth. Family has accepted bed at [facility]              Difficult to place patient No    TOTAL TIME TAKING CARE OF THIS PATIENT: 25 minutes.  >50% time spent on counselling and coordination of care  Note: This dictation was prepared with Dragon dictation along with smaller phrase technology. Any transcriptional errors that result from this process are unintentional.  Fritzi Mandes M.D    [facility]   CC: Primary care physician; Rusty Aus, MD Patient ID: [name], female   DOB: June 14, 1932, 87 y.o.   MRN: [id number]

## 2021-01-22 NOTE — Plan of Care (Signed)
No acute events during the night. VSS. Dressing to left hip intact. Problem: Education: Goal: Knowledge of General Education information will improve Description: Including pain rating scale, medication(s)/side effects and non-pharmacologic comfort measures Outcome: Progressing   Problem: Health Behavior/Discharge Planning: Goal: Ability to manage health-related needs will improve Outcome: Progressing   Problem: Clinical Measurements: Goal: Ability to maintain clinical measurements within normal limits will improve Outcome: Progressing Goal: Will remain free from infection Outcome: Progressing Goal: Diagnostic test results will improve Outcome: Progressing Goal: Respiratory complications will improve Outcome: Progressing Goal: Cardiovascular complication will be avoided Outcome: Progressing   Problem: Activity: Goal: Risk for activity intolerance will decrease Outcome: Progressing   Problem: Nutrition: Goal: Adequate nutrition will be maintained Outcome: Progressing   Problem: Coping: Goal: Level of anxiety will decrease Outcome: Progressing   Problem: Elimination: Goal: Will not experience complications related to bowel motility Outcome: Progressing Goal: Will not experience complications related to urinary retention Outcome: Progressing   Problem: Pain Managment: Goal: General experience of comfort will improve Outcome: Progressing   Problem: Safety: Goal: Ability to remain free from injury will improve Outcome: Progressing   Problem: Skin Integrity: Goal: Risk for impaired skin integrity will decrease Outcome: Progressing

## 2021-01-22 NOTE — Progress Notes (Signed)
Occupational Therapy Treatment Patient Details Name: [name] MRN: [id number] DOB: Jan 04, 1933 Today's Date: 01/22/2021   History of present illness [name] is a 87 y.o. female with past medical history of COPD, CHF, HTN, HDL, GERD, hypothyroidism, paroxysmal A. fib on Eliquis, DVT and arthritis who presents coming by daughter for assessment of acute left elbow and left hip pain after a fall. 01/17/21 DG Pelvis: Acute mildly impacted subcapital fracture of the left femoral neck. 01/17/21 DG L elbow: Acute transverse fracture of the olecranon with proximal retraction and substantial rotation of the proximal fragment.   OT comments  Pt seen for OT tx this date to f/u re: safety with ADLs/ADL mobility. Pt is initially pleasant, but gradually becomes slightly agitated as session progresses. She requires increased processing time for commands, but is able to follow most simple one step cues. Pt requires MAX A to come to sitting. She demos F sitting initially, but declines to P static sitting balance d/t fatigue and L hip pain, demonstrating R lateral lean. Pt requires MAX A To CTS with arm in arm from elevated surface. She requires encouragement to engage in SPS from bed to commode with 2p assist as well as cues to lean FWD to gain momentum. Pt left seated on BSC and PTA presents for treatment to assist pt in returning to bed once she is finished. Will continue to follow acutely. Continue to anticipate pt will require STR f/u OT services.    Recommendations for follow up therapy are one component of a multi-disciplinary discharge planning process, led by the attending physician.  Recommendations may be updated based on patient status, additional functional criteria and insurance authorization.    Follow Up Recommendations  SNF    Equipment Recommendations  Other (comment) (defer)    Recommendations for Other Services      Precautions / Restrictions Precautions Precautions:  Fall Restrictions Weight Bearing Restrictions: Yes LUE Weight Bearing: Non weight bearing LLE Weight Bearing: Weight bearing as tolerated Other Position/Activity Restrictions: Ortho stated arm sling can be worn for comfort if needed       Mobility Bed Mobility Overal bed mobility: Needs Assistance Bed Mobility: Supine to Sit;Sit to Supine     Supine to sit: Max assist;HOB elevated Sit to supine: Max assist;HOB elevated   General bed mobility comments: increased time    Transfers Overall transfer level: Needs assistance Equipment used: 2 person hand held assist Transfers: Sit to/from [facility] Sit to Stand: Max assist;From elevated surface Stand pivot transfers: Max assist;+2 physical assistance;+2 safety/equipment       General transfer comment: MAX +2 to SPS from EOB to/from [facility] including cues to sequence small shuffling steps from one surface to the next, not true amb d/t poor foot clearance, but pt makes attempts to weight shift with cues and assist bilaterally.    Balance Overall balance assessment: Needs assistance Sitting-balance support: Single extremity supported;Feet supported Sitting balance-[name]: Poor Sitting balance - Comments: F initially, becomes P with fatigue and pain, pt starts to lean to R side to offset weight from L hip   Standing balance support: Single extremity supported Standing balance-[name]: Zero Standing balance comment: Requires MAX A to susitan static stand                           ADL either performed or assessed with clinical judgement   ADL Overall ADL's : Needs assistance/impaired     Grooming:  Wash/dry hands;Set up;Sitting                   Toilet Transfer: Moderate assistance;+2 for physical assistance;+2 for safety/equipment;Stand-pivot;BSC;Cueing for safety;Cueing for sequencing Toilet Transfer Details (indicate cue type and reason): Pt requires increased time, encouragement and  cues to lean FWD with attempts to CTS. Toileting- Clothing Manipulation and Hygiene: Maximal assistance;Sitting/lateral lean               Vision Patient Visual Report: No change from baseline Additional Comments: difficult to formally assess d/t cognition, noted to have bruise over L eye/forehead   Perception     Praxis      Cognition Arousal/Alertness: Awake/alert Behavior During Therapy: Flat affect;Agitated Overall Cognitive Status: History of cognitive impairments - at baseline                                 General Comments: Pt initially sweet and particpatory, gradually becomes more agitated throughout session, possibly sun-downing. Requires increased time to process most commands.        Exercises Other Exercises Other Exercises: OT engages pt in commode transfer and toileting tasks.   Shoulder Instructions       General Comments      Pertinent Vitals/ Pain       Pain Assessment: Faces Faces Pain Scale: Hurts even more Pain Location: LUE/LLE with mobilization Pain Descriptors / Indicators: Discomfort;Grimacing Pain Intervention(s): Limited activity within patient's tolerance;Monitored during session;Premedicated before session;Repositioned  Home Living                                          Prior Functioning/Environment              Frequency  Min 2X/week        Progress Toward Goals  OT Goals(current goals can now be found in the care plan section)  Progress towards OT goals: Progressing toward goals  Acute Rehab OT Goals Patient Stated Goal: none stated OT Goal Formulation: With patient/family Time For Goal Achievement: 02/03/21 Potential to Achieve Goals: Good  Plan Discharge plan remains appropriate    Co-evaluation                 AM-PAC OT "6 Clicks" Daily Activity     Outcome Measure   Help from another person eating meals?: A Little Help from another person taking care of personal  grooming?: A Little Help from another person toileting, which includes using toliet, bedpan, or urinal?: A Lot Help from another person bathing (including washing, rinsing, drying)?: A Lot Help from another person to put on and taking off regular upper body clothing?: A Little Help from another person to put on and taking off regular lower body clothing?: A Lot 6 Click Score: 15    End of Session Equipment Utilized During Treatment: Gait belt  OT Visit Diagnosis: Other abnormalities of gait and mobility (R26.89);Muscle weakness (generalized) (M62.81)   Activity Tolerance Patient tolerated treatment well   Patient Left with call bell/phone within reach;in bed;with bed alarm set   Nurse Communication Mobility status        Time: 7253-6644 OT Time Calculation (min): 33 min  Charges: OT General Charges $OT Visit: 1 Visit OT Treatments $Self Care/Home Management : 8-22 mins $Therapeutic Activity: 8-22 mins  Gerrianne Scale, [place], OTR/L ascom [phone]  01/22/21, 4:30 PM

## 2021-01-22 NOTE — Progress Notes (Signed)
   01/19/21 2103  Assess: MEWS Score  Temp (!) 97.1 F (36.2 C)  BP (!) 151/82  Pulse Rate 90  Level of Consciousness Responds to Voice  SpO2 91 %  O2 Device Nasal Cannula  O2 Flow Rate (L/min) 3 L/min  Assess: MEWS Score  MEWS Temp 0  MEWS Systolic 0  MEWS Pulse 0  MEWS RR 1  MEWS LOC 1  MEWS Score 2  MEWS Score Color Yellow  Assess: if the MEWS score is Yellow or Red  Were vital signs taken at a resting state? Yes  Focused Assessment  (UTA patient returned from OR)  Does the patient meet 2 or more of the SIRS criteria? Yes  Does the patient have a confirmed or suspected source of infection? No  MEWS guidelines implemented *See Row Information* Yes  Treat  MEWS Interventions Escalated (See documentation below)  Pain Scale PAINAD  Breathing 0  Negative Vocalization 0  Facial Expression 0  Body Language 0  Consolability 0  PAINAD Score 0  Take Vital Signs  Increase Vital Sign Frequency  Yellow: Q 2hr X 2 then Q 4hr X 2, if remains yellow, continue Q 4hrs  Escalate  MEWS: Escalate Yellow: discuss with charge nurse/RN and consider discussing with provider and RRT  Notify: Charge Nurse/RN  Name of Charge Nurse/RN Notified Verdis Frederickson, RN  Date Charge Nurse/RN Notified 01/19/21  Time Charge Nurse/RN Notified 2113  Notify: Provider  Provider Name/Title Sharion Settler, NP  Date Provider Notified 01/19/21  Time Provider Notified 2113  Notification Type Call  Notification Reason Other (Comment) (Low axillary temperature. Additional layers added)  Provider response See new orders (Increase oxygen to 4 liters)  Date of Provider Response 01/19/21  Time of Provider Response 2122  Document  Patient Outcome Stabilized after interventions  Progress note created (see row info) Yes  Assess: SIRS CRITERIA  SIRS Temperature  0  SIRS Pulse 0  SIRS Respirations  0  SIRS WBC 0  SIRS Score Sum  0

## 2021-01-23 DIAGNOSIS — W19XXXA Unspecified fall, initial encounter: Secondary | ICD-10-CM | POA: Diagnosis not present

## 2021-01-23 DIAGNOSIS — K219 Gastro-esophageal reflux disease without esophagitis: Secondary | ICD-10-CM | POA: Diagnosis not present

## 2021-01-23 DIAGNOSIS — S72002A Fracture of unspecified part of neck of left femur, initial encounter for closed fracture: Secondary | ICD-10-CM | POA: Diagnosis not present

## 2021-01-23 DIAGNOSIS — S42402A Unspecified fracture of lower end of left humerus, initial encounter for closed fracture: Secondary | ICD-10-CM | POA: Diagnosis not present

## 2021-01-23 MED ORDER — PANTOPRAZOLE 2 MG/ML SUSPENSION
40.0000 mg | Freq: Every day | ORAL | Status: DC
  Administered 2021-01-23 – 2021-01-24 (×2): 40 mg via ORAL
  Filled 2021-01-23 (×2): qty 20

## 2021-01-23 MED ORDER — METOPROLOL TARTRATE 25 MG PO TABS
25.0000 mg | ORAL_TABLET | Freq: Every day | ORAL | Status: DC
  Administered 2021-01-23: 25 mg via ORAL
  Filled 2021-01-23: qty 1

## 2021-01-23 MED ORDER — QUETIAPINE FUMARATE 25 MG PO TABS
25.0000 mg | ORAL_TABLET | Freq: Two times a day (BID) | ORAL | Status: DC | PRN
  Administered 2021-01-23: 25 mg via ORAL
  Filled 2021-01-23: qty 1

## 2021-01-23 MED ORDER — ASPIRIN 81 MG PO CHEW
81.0000 mg | CHEWABLE_TABLET | Freq: Every day | ORAL | Status: DC
  Administered 2021-01-23 – 2021-01-24 (×2): 81 mg via ORAL
  Filled 2021-01-23 (×2): qty 1

## 2021-01-23 MED ORDER — METOPROLOL TARTRATE 50 MG PO TABS
50.0000 mg | ORAL_TABLET | Freq: Every day | ORAL | Status: DC
  Administered 2021-01-23 – 2021-01-24 (×2): 50 mg via ORAL
  Filled 2021-01-23 (×2): qty 1

## 2021-01-23 NOTE — Progress Notes (Signed)
[facility] [place] NAME: [name]    MR#:  [id number]  DATE OF BIRTH:  Jan 30, 1933  SUBJECTIVE:  Pt is more awake and pleasant Patient apparently had a mechanical fall at [place].   POD #4 pleasant confusion but answered most questions appropriately  Left UE swelling little better. OOB to chair this am REVIEW OF SYSTEMS:   Review of Systems  Unable to perform ROS: Mental status change  Tolerating Diet:yes Tolerating PT: SNF  DRUG ALLERGIES:   Allergies  Allergen Reactions   Other Other (See Comments)    NO BP, VENIPUNCTURE, OR ACCESS IN RIGHT ARM - S/P MASTECTOMY ON RIGHT   Amlodipine Nausea And Vomiting   Ciprofloxacin Nausea Only   Doxycycline Itching and Other (See Comments)    shaky   Lipitor [Atorvastatin] Other (See Comments)    Joint pain    Morphine And Related Nausea And Vomiting   Macrobid [Nitrofurantoin Macrocrystal] Rash   Penicillins Rash and Other (See Comments)    Has patient had a PCN reaction causing immediate rash, facial/tongue/throat swelling, SOB or lightheadedness with hypotension: yes Has patient had a PCN reaction causing severe rash involving mucus membranes or skin necrosis: no Has patient had a PCN reaction that required hospitalization no Has patient had a PCN reaction occurring within the last 10 years: no If all of the above answers are "NO", then may proceed with Cephalosporin use.    Sulfa Antibiotics Rash    VITALS:  Blood pressure (!) 166/94, pulse 100, temperature 98 F (36.7 C), resp. rate 18, height 5\' 8"  (1.727 m), weight 68.2 kg, SpO2 96 %.  PHYSICAL EXAMINATION:   Physical Exam  GENERAL:  87 y.o.-year-old patient lying in the bed with no acute distress.  LUNGS: Normal breath sounds bilaterally, no wheezing, rales, rhonchi. No use of accessory muscles of respiration.  CARDIOVASCULAR: S1, S2 normal. No murmurs, rubs, or gallops.  ABDOMEN: Soft, nontender, nondistended. Bowel  sounds present. No organomegaly or mass.  EXTREMITIES: left UE ace wrap, hand swollen--good sensation NEUROLOGIC: grossly non focal   PSYCHIATRIC:  patient is alert and awake SKIN: No obvious rash, lesion, or ulcer.   LABORATORY PANEL:  CBC Recent Labs  Lab 01/21/21 0544  WBC 14.7*  HGB 12.1  HCT 36.5  PLT 177     Chemistries  Recent Labs  Lab 01/19/21 0317  NA 139  K 3.9  CL 104  CO2 26  GLUCOSE 106*  BUN 19  CREATININE 1.42*  CALCIUM 8.4*    Cardiac Enzymes No results for input(s): TROPONINI in the last 168 hours. RADIOLOGY:  No results found. ASSESSMENT AND PLAN:  [name] is an 87 year old female with mild Alzheimer's dementia who lives in independent living, paroxysmal atrial fibrillation, chronic kidney disease, hypertension who presents to the hospital after a fall in the bathroom at [place]. She is found to have fracture of her left olecranon and left subcapital femoral neck #   Fall   Closed left subcapital femoral neck fracture (HCC)   Left elbow/olecranon fracture -Orthopedic surgery, Dr. Harlow Mares has been consulted and will be assisting with management - Left arm is in a sling --prn pain meds --10/6--POD #1 hip and elbow surgery  --10/7 POD #2 left UE swelling +--evaluated by ortho--dressing changed, Ace loosened --10/8 POD #3 Dr Sabra Heck rewrapped the left UE --10/9 POD #4 OOB to chair. No acute issues per RN  Acute onset confusion - UA reveals that there  is no UTI - Continue to follow for delirium   Leukocytosis - no infection noted- possibly acute stress response     PAF (paroxysmal atrial fibrillation) (HCC) -Per, the patient's dter  pt has stopped taking Eliquis sometime ago when she was found to have a low hemoglobin and Hemoccult positive stools - Continue amiodarone and Toprol   Diarrhea--chronic due to Collagenous colitis - Recently placed on budesonide by GI - As mentioned above, she was recently found to be anemic with a  hemoglobin of 6 and found to have heme positive stool --hgb stable at 12.3 --no rectal bleeding noted    Hypertension  Chronic diastolic congestive heart failure  holding torsemide  resumed spirnolactone  Hypothyroidism - Continue levothyroxine     DVT prophylaxis: lovenox Code Status: DNR per dter Peggye Fothergill Family Communication: none today Level of Care: Level of care: Med-Surg Disposition Plan:  Status is: Inpatient     Dispo: The patient is from: Home              Anticipated d/c is to: rehab              Patient currently is medically stable to d/c--awaiting insurance auth. Family has accepted bed [facility]              Difficult to place patient No    TOTAL TIME TAKING CARE OF THIS PATIENT: 25 minutes.  >50% time spent on counselling and coordination of care  Note: This dictation was prepared with Dragon dictation along with smaller phrase technology. Any transcriptional errors that result from this process are unintentional.  Fritzi Mandes M.D    [facility]   CC: Primary care physician; Rusty Aus, MD Patient ID: [name], female   DOB: 1932-09-27, 87 y.o.   MRN: [id number]

## 2021-01-23 NOTE — Progress Notes (Signed)
Physical Therapy Treatment Patient Details Name: [name] MRN: [id number] DOB: 03-Nov-1932 Today's Date: 01/23/2021   History of Present Illness [name] is a 87 y.o. female with past medical history of COPD, CHF, HTN, HDL, GERD, hypothyroidism, paroxysmal A. fib on Eliquis, DVT and arthritis who presents coming by daughter for assessment of acute left elbow and left hip pain after a fall. 01/17/21 DG Pelvis: Acute mildly impacted subcapital fracture of the left femoral neck. 01/17/21 DG L elbow: Acute transverse fracture of the olecranon with proximal retraction and substantial rotation of the proximal fragment.    PT Comments    Pt ready for session.  Participated in exercises as described below.  Limited ROM due to pain.   She is able to get to edge of bed with max a x 1.  Sat x 10 minutes with min guard/supervision to brush her teeth.  She struggles with task but is able to complete with increased time.  She is assisted to chair with mod/max a +2 stand pivot transfer.  +2 required for pt and staff safety along with pt comfort due to pain.  Remained in recliner after session.   Recommendations for follow up therapy are one component of a multi-disciplinary discharge planning process, led by the attending physician.  Recommendations may be updated based on patient status, additional functional criteria and insurance authorization.  Follow Up Recommendations  SNF     Equipment Recommendations  None recommended by PT    Recommendations for Other Services       Precautions / Restrictions Precautions Precautions: Fall Restrictions Weight Bearing Restrictions: Yes LUE Weight Bearing: Non weight bearing LLE Weight Bearing: Weight bearing as tolerated Other Position/Activity Restrictions: Ortho stated arm sling can be worn for comfort if needed     Mobility  Bed Mobility Overal bed mobility: Needs Assistance Bed Mobility: Sit to Supine     Supine to sit: Max assist;HOB  elevated          Transfers Overall transfer level: Needs assistance Equipment used: 2 person hand held assist Transfers: Stand Pivot Transfers Sit to Stand: Max assist;From elevated surface;+2 physical assistance;Mod assist Stand pivot transfers: Max assist;+2 physical assistance;Mod assist       General transfer comment: + 2 for assist and safety.  Ambulation/Gait             General Gait Details: pt was able to pivot but not able to actually take steps 2/2 to pain and fatigue   Stairs             Wheelchair Mobility    Modified Rankin (Stroke Patients Only)       Balance Overall balance assessment: Needs assistance Sitting-balance support: Single extremity supported;Feet supported Sitting balance-[name]: Poor     Standing balance support: Single extremity supported Standing balance-[name]: Poor Standing balance comment: high fall risk                            Cognition Arousal/Alertness: Awake/alert Behavior During Therapy: WFL for tasks assessed/performed Overall Cognitive Status: History of cognitive impairments - at baseline                                        Exercises General Exercises - Lower Extremity Ankle Circles/Pumps: AROM;Both;20 reps;Supine Heel Slides: AAROM;Both;15 reps Hip ABduction/ADduction: AAROM;Both;15 reps  General Comments        Pertinent Vitals/Pain Pain Assessment: Faces Faces Pain Scale: Hurts whole lot Pain Location: LUE/LLE with mobilization Pain Descriptors / Indicators: Discomfort;Grimacing Pain Intervention(s): Limited activity within patient's tolerance;Monitored during session;Premedicated before session    Home Living                      Prior Function            PT Goals (current goals can now be found in the care plan section) Progress towards PT goals: Progressing toward goals    Frequency    BID      PT Plan Current plan remains  appropriate    Co-evaluation              AM-PAC PT "6 Clicks" Mobility   Outcome Measure  Help needed turning from your back to your side while in a flat bed without using bedrails?: A Lot Help needed moving from lying on your back to sitting on the side of a flat bed without using bedrails?: Total Help needed moving to and from a bed to a chair (including a wheelchair)?: Total Help needed standing up from a chair using your arms (e.g., wheelchair or bedside chair)?: Total Help needed to walk in hospital room?: Total Help needed climbing 3-5 steps with a railing? : Total 6 Click Score: 7    End of Session Equipment Utilized During Treatment: Gait belt Activity Tolerance: Patient limited by pain Patient left: in chair;with call bell/phone within reach;with chair alarm set;with nursing/sitter in room Nurse Communication: Mobility status PT Visit Diagnosis: Repeated falls (R29.6);Muscle weakness (generalized) (M62.81);History of falling (Z91.81);Difficulty in walking, not elsewhere classified (R26.2);Pain Pain - Right/Left: Left Pain - part of body: Hip     Time: 8127-5170 PT Time Calculation (min) (ACUTE ONLY): 24 min  Charges:  $Therapeutic Exercise: 8-22 mins $Therapeutic Activity: 8-22 mins                    Chesley Noon, PTA 01/23/21, 10:14 AM

## 2021-01-23 NOTE — Progress Notes (Signed)
Subjective: 4 Days Post-Op Procedure(s) (LRB): CLOSED REDUCTION HIP (Left) PERCUTANEOUS PINNING EXTREMITY (Left) OPEN REDUCTION INTERNAL FIXATION (ORIF) ELBOW/OLECRANON FRACTURE (Left) The patient is looking better today.  She is out of bed in the chair.  Swelling is decreased in the hand.  Dressings  on the elbow are dry.  Serous drainage on the left hip.  Dressing will be changed.  Patient reports pain as mild.  Objective:   VITALS:   Vitals:   01/23/21 1138 01/23/21 1156  BP: (!) 172/92 (!) 166/94  Pulse: (!) 104 100  Resp: 18   Temp: 98 F (36.7 C)   SpO2: 96%     Neurologically intact Left hip is stable and alignment is good.  Minimal swelling.  No significant pain left elbow in the splint.  LABS Recent Labs    01/21/21 0544  HGB 12.1  HCT 36.5  WBC 14.7*  PLT 177    No results for input(s): NA, K, BUN, CREATININE, GLUCOSE in the last 72 hours.  No results for input(s): LABPT, INR in the last 72 hours.   Assessment/Plan: 4 Days Post-Op Procedure(s) (LRB): CLOSED REDUCTION HIP (Left) PERCUTANEOUS PINNING EXTREMITY (Left) OPEN REDUCTION INTERNAL FIXATION (ORIF) ELBOW/OLECRANON FRACTURE (Left)   Advance diet Up with therapy Discharge to SNF

## 2021-01-24 DIAGNOSIS — S72002A Fracture of unspecified part of neck of left femur, initial encounter for closed fracture: Secondary | ICD-10-CM | POA: Diagnosis not present

## 2021-01-24 DIAGNOSIS — S42402A Unspecified fracture of lower end of left humerus, initial encounter for closed fracture: Secondary | ICD-10-CM | POA: Diagnosis not present

## 2021-01-24 LAB — RESP PANEL BY RT-PCR (FLU A&B, COVID) ARPGX2
Influenza A by PCR: NEGATIVE
Influenza B by PCR: NEGATIVE
SARS Coronavirus 2 by RT PCR: NEGATIVE

## 2021-01-24 MED ORDER — AMIODARONE HCL 200 MG PO TABS: 200.0000 mg | ORAL_TABLET | Freq: Every day | ORAL | 0 refills | Status: DC

## 2021-01-24 MED ORDER — LABETALOL HCL 5 MG/ML IV SOLN
20.0000 mg | INTRAVENOUS | Status: DC | PRN
Start: 2021-01-24 — End: 2021-01-24
  Administered 2021-01-24: 20 mg via INTRAVENOUS
  Filled 2021-01-24: qty 4

## 2021-01-24 NOTE — Progress Notes (Signed)
Patient scored a yellow MEWS (2). She has been tachycardic overnight and then lethargic this morning. She was given Seroquel at midnight, which was a new medication for her, due to agitation. She is able to wake up with stimulation, but fall back to sleep. Dr. Posey Pronto was made aware and made no new orders.

## 2021-01-24 NOTE — Progress Notes (Signed)
Nutrition Follow-up  DOCUMENTATION CODES:  Severe malnutrition in context of social or environmental circumstances  INTERVENTION:  Continue regular diet for advanced age and malnutrition Magic cup TID with meals, each supplement provides 290 kcal and 9 grams of protein Carnation Instant Breakfast BID- each packet provides 130kcal and 5g protein +milk New measured weight  NUTRITION DIAGNOSIS:  Severe Malnutrition related to chronic illness (COPD, CHF) as evidenced by severe fat depletion, severe muscle depletion.  GOAL:  Patient will meet greater than or equal to 90% of their needs  MONITOR:  PO intake, Supplement acceptance, Labs  REASON FOR ASSESSMENT:  Consult Hip fracture protocol  ASSESSMENT:  87 y.o. female with past medical history of COPD, CHF, CKD4, HTN, HDL, GERD, hypothyroidism, PAD, A. fib, and hx of breast cancer (s/p radiation and mastectomy) who presented to ED with left elbow and left hip pain after a fall earlier in the day.  Imaging in ED showed a hip and elbow fracture. Orthopedics took pt for ORIF of the elbow and repair of the hip 10/5.  Pt sleeping at the time of visit, daughter at bedside. Reports that pt has been eating some, but appetite is not back to baseline. Pt is not drinking the ensure supplements. Reports that she prefers boost and she has been bringing them in from home, but has not been drinking those consistently either. Reports that pt does like ice cream, will remove ensure and add magic cup. Also agreeable to trying carnation instant breakfast.  Average Meal Intake: 10/6-10/9: 58% intake x 5 recorded meals  Nutritionally Relevant Medications: Scheduled Meds:  calcium-vitamin D  1 tablet Oral Q breakfast   cholecalciferol  5,000 Units Oral Daily   docusate sodium  100 mg Oral BID   famotidine  40 mg Oral QHS   feeding supplement  237 mL Oral BID BM   multivitamin with minerals  1 tablet Oral Daily   pantoprazole sodium  40 mg Oral Daily    spironolactone  25 mg Oral Daily   Continuous Infusions:  clindamycin (CLEOCIN) IV     PRN Meds: metoCLOPramide, ondansetron  Labs Reviewed: Creatinine 1.26  NUTRITION - FOCUSED PHYSICAL EXAM: Flowsheet Row Most Recent Value  Orbital Region Moderate depletion  Upper Arm Region Severe depletion  Thoracic and Lumbar Region Severe depletion  Buccal Region Moderate depletion  Temple Region Moderate depletion  Clavicle Bone Region Severe depletion  Clavicle and Acromion Bone Region Severe depletion  Scapular Bone Region Severe depletion  Dorsal Hand Moderate depletion  Patellar Region Mild depletion  Anterior Thigh Region Mild depletion  Posterior Calf Region Mild depletion  Edema (RD Assessment) Mild  Hair Reviewed  Eyes Reviewed  Mouth Reviewed  Skin Reviewed  Nails Reviewed   Diet Order:   Diet Order             Diet regular Room service appropriate? Yes; Fluid consistency: Thin  Diet effective now                  EDUCATION NEEDS:  No education needs have been identified at this time  Skin:  Skin Assessment: Skin Integrity Issues: Skin Integrity Issues:: Incisions Incisions: left hip, left elbow  Last BM:  10/9  Height:  Ht Readings from Last 1 Encounters:  01/18/21 5\' 8"  (1.727 m)   Weight:  Wt Readings from Last 1 Encounters:  01/20/21 68.2 kg    Ideal Body Weight:  63.6 kg  BMI:  Body mass index is 22.86 kg/m.  Estimated  Nutritional Needs:  Kcal:  1500-1700 kcal/d Protein:  75-85 g/d Fluid:  1.5-1.8 L/d   Ranell Patrick, RD, LDN Clinical Dietitian Pager on Amion

## 2021-01-24 NOTE — Discharge Summary (Addendum)
[facility] NAME: [name]    MR#:  [id number]  DATE OF BIRTH:  06/23/1932  DATE OF ADMISSION:  01/17/2021 ADMITTING PHYSICIAN: Para Skeans, MD  DATE OF DISCHARGE: 01/24/2021  PRIMARY CARE PHYSICIAN: Rusty Aus, MD    ADMISSION DIAGNOSIS:  Fall [W19.XXXA] Closed fracture of left hip, initial encounter ([place]) [S72.002A] Closed fracture of left elbow, initial encounter [S42.402A] Skin tear of left elbow without complication, initial encounter [S51.012A]  DISCHARGE DIAGNOSIS:  Closed left sub capital femoral neck fracture and left Olecranon fracture status post surgery  SECONDARY DIAGNOSIS:   Past Medical History:  Diagnosis Date  . (HFpEF) heart failure with preserved ejection fraction ([place])    a. 12/2015 Echo: EF 60-65%. no rwma, Gr2 DD, triv AI, mild MR, mildly dil LA. Mild-mod TR. PASP 3mmHg.  Marland Kitchen Anemia   . Arthritis    "back, hands" (09/08/2015)  . Cancer of right breast [facility]) 2007, 2013   right breast- 2007 radiation-mastectomy  . Chronic lower back pain   . CKD (chronic kidney disease), stage IV ([place])   . Complication of anesthesia    "took me about 1 week to know what was going on after one of my knee ORs"  . COPD (chronic obstructive pulmonary disease) ([place])   . DDD (degenerative disc disease), lumbar   . Depression   . 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 DVT (deep venous thrombosis) ([place])   . Edema    FEET/ANKLES  . GERD (gastroesophageal reflux disease)   . History of sepsis   . History of stress test    a. 08/2010: EF 73%, no ischemia/infarct.  . Hyperlipidemia   . Hypertension   . Hypothyroid   . PAD (peripheral artery disease) ([place])    a. 08/2015 s/p PTA of R AT w/ drug-coated balloon angioplasty to R Popliteal; b. 05/2017 ABI: R 1.16, L 0.88-->stable.  Marland Kitchen PAF (paroxysmal atrial fibrillation) ([place])    a. 08/2010 s/p DCCV;  b. CHA2DS2VASc = 6-->No OAC 2/2 h/o falls.  Marland Kitchen PONV  (postoperative nausea and vomiting)    nausea vomiting long ago with surgery but not recent ones    HOSPITAL COURSE:  [name] is an 87 year old female with mild Alzheimer's dementia who lives in independent living, paroxysmal atrial fibrillation, chronic kidney disease, hypertension who presents to the hospital after a fall in the bathroom at [place]. She is found to have fracture of her left olecranon and left subcapital femoral neck #    Fall Closed left subcapital femoral neck fracture (HCC) Left elbow/olecranon fracture -Orthopedic surgery, Dr. Harlow Mares has been consulted and will be assisting with management - Left arm is in a sling --prn pain meds --10/6--POD #1 hip and elbow surgery  --10/7 POD #2 left UE swelling +--evaluated by ortho--dressing changed, Ace loosened --10/8 POD #3 Dr Sabra Heck rewrapped the left UE --10/9 POD #4 OOB to chair. No acute issues per RN --10/10--POD#5 some pleasant confusion    Leukocytosis - no infection noted- possibly acute stress response   Confusion --pt received some seroquel last nite--she is sleepy from it this morning --d/c seroquel    PAF (paroxysmal atrial fibrillation) (HCC) -Per, the patient's dter  pt has stopped taking Eliquis sometime ago when she was found to have a low hemoglobin and Hemoccult positive stools - Continue amiodarone and Toprol   Diarrhea--chronic due to Collagenous colitis - Recently placed on budesonide by GI -  As mentioned above, she was recently found to be anemic with a hemoglobin of 6 and found to have heme positive stool --hgb stable at 12.3 --no rectal bleeding noted    Hypertension  Chronic diastolic congestive heart failure  holding torsemide  resumed spirnolactone   Hypothyroidism - Continue levothyroxine     DVT prophylaxis: lovenox Code Status: DNR per dter Peggye Fothergill Family Communication: dter Peggye Fothergill at bedside Level of Care: Level of care: Med-Surg Disposition Plan:  Status is:  Inpatient     Dispo: The patient is from: Home              Anticipated d/c is to: rehab              Patient currently is medically stable to d/c--awaiting insurance auth. Family has accepted bed at [facility]              Difficult to place patient No  CONSULTS OBTAINED:  Treatment Team:  Lovell Sheehan, MD  DRUG ALLERGIES:   Allergies  Allergen Reactions  . Other Other (See Comments)    NO BP, VENIPUNCTURE, OR ACCESS IN RIGHT ARM - S/P MASTECTOMY ON RIGHT  . Amlodipine Nausea And Vomiting  . Ciprofloxacin Nausea Only  . Doxycycline Itching and Other (See Comments)    shaky  . Lipitor [Atorvastatin] Other (See Comments)    Joint pain   . Morphine And Related Nausea And Vomiting  . Macrobid [Nitrofurantoin Macrocrystal] Rash  . Penicillins Rash and Other (See Comments)    Has patient had a PCN reaction causing immediate rash, facial/tongue/throat swelling, SOB or lightheadedness with hypotension: yes Has patient had a PCN reaction causing severe rash involving mucus membranes or skin necrosis: no Has patient had a PCN reaction that required hospitalization no Has patient had a PCN reaction occurring within the last 10 years: no If all of the above answers are "NO", then may proceed with Cephalosporin use.   . Sulfa Antibiotics Rash    DISCHARGE MEDICATIONS:   Allergies as of 01/24/2021       Reactions   Other Other (See Comments)   NO BP, VENIPUNCTURE, OR ACCESS IN RIGHT ARM - S/P MASTECTOMY ON RIGHT   Amlodipine Nausea And Vomiting   Ciprofloxacin Nausea Only   Doxycycline Itching, Other (See Comments)   shaky   Lipitor [atorvastatin] Other (See Comments)   Joint pain   Morphine And Related Nausea And Vomiting   Macrobid [nitrofurantoin Macrocrystal] Rash   Penicillins Rash, Other (See Comments)   Has patient had a PCN reaction causing immediate rash, facial/tongue/throat swelling, SOB or lightheadedness with hypotension: yes Has patient had a PCN reaction causing  severe rash involving mucus membranes or skin necrosis: no Has patient had a PCN reaction that required hospitalization no Has patient had a PCN reaction occurring within the last 10 years: no If all of the above answers are "NO", then may proceed with Cephalosporin use.   Sulfa Antibiotics Rash        Medication List     STOP taking these medications    ALLEVYN GENTLE BORDER EX   NON FORMULARY   potassium chloride 10 MEQ tablet Commonly known as: KLOR-CON       TAKE these medications    acetaminophen 500 MG tablet Commonly known as: TYLENOL Take 1,000 mg by mouth at bedtime as needed for moderate pain or headache.   acetaminophen 325 MG tablet Commonly known as: TYLENOL Take 650 mg by mouth every 4 (four) hours  as needed (pain).   amiodarone 200 MG tablet Commonly known as: PACERONE Take 1 tablet (200 mg total) by mouth daily. What changed: when to take this   aspirin EC 81 MG tablet Take 81 mg by mouth daily.   budesonide 3 MG 24 hr capsule Commonly known as: ENTOCORT EC Take 9 mg by mouth every morning.   calcium-vitamin D 500-200 MG-UNIT tablet Commonly known as: OSCAL WITH D Take 1 tablet by mouth daily with breakfast.   CENTROVITE Tabs Take 1 tablet by mouth daily.   docusate sodium 100 MG capsule Commonly known as: COLACE Take 100 mg by mouth daily as needed for mild constipation.   enoxaparin 30 MG/0.3ML injection Commonly known as: LOVENOX Inject 0.3 mLs (30 mg total) into the skin daily for 12 days.   famotidine 40 MG tablet Commonly known as: PEPCID Take 40 mg by mouth at bedtime.   HYDROcodone-acetaminophen 5-325 MG tablet Commonly known as: NORCO/VICODIN Take 1 tablet by mouth every 4 (four) hours as needed for moderate pain or severe pain. What changed:  how much to take when to take this reasons to take this   lactose free nutrition Liqd Take 237 mLs by mouth daily.   levothyroxine 112 MCG tablet Commonly known as:  SYNTHROID Take 112 mcg by mouth daily before breakfast.   metoprolol succinate 50 MG 24 hr tablet Commonly known as: TOPROL-XL Take 25-50 mg by mouth 2 (two) times daily. 1 tab in am and 1/2 tab in pm   PARoxetine 20 MG tablet Commonly known as: PAXIL Take 1 tablet (20 mg total) by mouth daily.   spironolactone 25 MG tablet Commonly known as: ALDACTONE Take 1 tablet (25 mg total) by mouth daily.   Vitamin D3 125 MCG (5000 UT) Caps Take 5,000 Units by mouth daily.               Discharge Care Instructions  (From admission, onward)           Start     Ordered   01/24/21 0000  Discharge wound care:       Comments: Reinforce dressing until discontinued   01/24/21 1000            If you experience worsening of your admission symptoms, develop shortness of breath, life threatening emergency, suicidal or homicidal thoughts you must seek medical attention immediately by calling 911 or calling your MD immediately  if symptoms less severe.  You Must read complete instructions/literature along with all the possible adverse reactions/side effects for all the Medicines you take and that have been prescribed to you. Take any new Medicines after you have completely understood and accept all the possible adverse reactions/side effects.   Please note  You were cared for by a hospitalist during your hospital stay. If you have any questions about your discharge medications or the care you received while you were in the hospital after you are discharged, you can call the unit and asked to speak with the hospitalist on call if the hospitalist that took care of you is not available. Once you are discharged, your primary care physician will handle any further medical issues. Please note that NO REFILLS for any discharge medications will be authorized once you are discharged, as it is imperative that you return to your primary care physician (or establish a relationship with a primary care  physician if you do not have one) for your aftercare needs so that they can reassess your need for medications  and monitor your lab values. Today   SUBJECTIVE   Resting dter at bedside   VITAL SIGNS:  Blood pressure (!) 156/103, pulse (!) 110, temperature 98 F (36.7 C), temperature source Oral, resp. rate 16, height 5\' 8"  (1.727 m), weight 68.2 kg, SpO2 96 %.  I/O:   Intake/Output Summary (Last 24 hours) at 01/24/2021 1003 Last data filed at 01/24/2021 0412 Gross per 24 hour  Intake --  Output 1750 ml  Net -1750 ml    PHYSICAL EXAMINATION:  GENERAL:  87 y.o.-year-old patient lying in the bed with no acute distress.  LUNGS: Normal breath sounds bilaterally, no wheezing, rales, rhonchi. No use of accessory muscles of respiration.  CARDIOVASCULAR: S1, S2 normal. No murmurs, rubs, or gallops.  ABDOMEN: Soft, nontender, nondistended. Bowel sounds present. No organomegaly or mass.  EXTREMITIES: left UE ace wrap, swelling remarkably improved NEUROLOGIC: grossly non focal   PSYCHIATRIC:  patient is resting SKIN: No obvious rash, lesion, or ulcer. DATA REVIEW:   CBC  Recent Labs  Lab 01/21/21 0544  WBC 14.7*  HGB 12.1  HCT 36.5  PLT 177    Chemistries  Recent Labs  Lab 01/19/21 0317  NA 139  K 3.9  CL 104  CO2 26  GLUCOSE 106*  BUN 19  CREATININE 1.42*  CALCIUM 8.4*    Microbiology Results   Recent Results (from the past 240 hour(s))  Resp Panel by RT-PCR (Flu A&B, Covid) Nasopharyngeal Swab     Status: None   Collection Time: 01/17/21  8:16 PM   Specimen: Nasopharyngeal Swab; Nasopharyngeal(NP) swabs in vial transport medium  Result Value Ref Range Status   SARS Coronavirus 2 by RT PCR NEGATIVE NEGATIVE Final    Comment: (NOTE) SARS-CoV-2 target nucleic acids are NOT DETECTED.  The SARS-CoV-2 RNA is generally detectable in upper respiratory specimens during the acute phase of infection. The lowest concentration of SARS-CoV-2 viral copies this assay can  detect is 138 copies/mL. A negative result does not preclude SARS-Cov-2 infection and should not be used as the sole basis for treatment or other patient management decisions. A negative result may occur with  improper specimen collection/handling, submission of specimen other than nasopharyngeal swab, presence of viral mutation(s) within the areas targeted by this assay, and inadequate number of viral copies(<138 copies/mL). A negative result must be combined with clinical observations, patient history, and epidemiological information. The expected result is Negative.  Fact Sheet for Patients:  EntrepreneurPulse.com.au  Fact Sheet for Healthcare Providers:  IncredibleEmployment.be  This test is no t yet approved or cleared by the [place] FDA and  has been authorized for detection and/or diagnosis of SARS-CoV-2 by FDA under an Emergency Use Authorization (EUA). This EUA will remain  in effect (meaning this test can be used) for the duration of the COVID-19 declaration under Section 564(b)(1) of the Act, 21 U.S.C.section 360bbb-3(b)(1), unless the authorization is terminated  or revoked sooner.       Influenza A by PCR NEGATIVE NEGATIVE Final   Influenza B by PCR NEGATIVE NEGATIVE Final    Comment: (NOTE) The Xpert Xpress SARS-CoV-2/FLU/RSV plus assay is intended as an aid in the diagnosis of influenza from Nasopharyngeal swab specimens and should not be used as a sole basis for treatment. Nasal washings and aspirates are unacceptable for Xpert Xpress SARS-CoV-2/FLU/RSV testing.  Fact Sheet for Patients: EntrepreneurPulse.com.au  Fact Sheet for Healthcare Providers: IncredibleEmployment.be  This test is not yet approved or cleared by the [place] FDA and has been  authorized for detection and/or diagnosis of SARS-CoV-2 by FDA under an Emergency Use Authorization (EUA). This EUA will remain in  effect (meaning this test can be used) for the duration of the COVID-19 declaration under Section 564(b)(1) of the Act, 21 U.S.C. section 360bbb-3(b)(1), unless the authorization is terminated or revoked.  Performed at [facility], [address] [place], [place] [address]     RADIOLOGY:  No results found.   CODE STATUS:     Code Status Orders  (From admission, onward)           Start     Ordered   01/19/21 1304  Do not attempt resuscitation (DNR)  Continuous       Question Answer Comment  In the event of cardiac or respiratory ARREST Do not call a "code blue"   In the event of cardiac or respiratory ARREST Do not perform Intubation, CPR, defibrillation or ACLS   In the event of cardiac or respiratory ARREST Use medication by any route, position, wound care, and other measures to relive pain and suffering. May use oxygen, suction and manual treatment of airway obstruction as needed for comfort.   Comments d/w dter Jodie      01/19/21 1304           Code Status History     Date Active Date Inactive Code Status Order ID Comments User Context   01/17/2021 2030 01/19/2021 1304 Full Code [id number]  Para Skeans, MD ED   12/21/2020 0014 12/21/2020 2148 Full Code [id number]  Para Skeans, MD ED   06/25/2018 1544 06/25/2018 1900 Full Code [id number]  Hessie Knows, MD Inpatient   03/01/2018 1254 03/02/2018 1739 DNR [id number]  Demetrios Loll, MD Inpatient   02/26/2018 1143 03/01/2018 1254 Full Code [id number]  Corky Mull, MD Inpatient   07/20/2017 1724 07/24/2017 1458 DNR [id number]  Vaughan Basta, MD ED   01/12/2016 0612 01/12/2016 0734 Full Code [id number]  Harrie Foreman, MD Inpatient   08/19/2014 0940 08/19/2014 1908 Full Code [id number]  Wellington Hampshire, MD Inpatient        TOTAL TIME TAKING CARE OF THIS PATIENT: 40 minutes.    Fritzi Mandes M.D  [facility]    CC: Primary care physician; Rusty Aus, MD

## 2021-01-24 NOTE — Progress Notes (Signed)
Patient took splint off. Splint, new ABD pads x2, ACE wrap applied. Left elbow ecchymosis, moderate amount of serosanguinous drainage. LUE elevated on pillow.  patient tolerated well no complaints of pain.

## 2021-01-24 NOTE — Progress Notes (Signed)
Physical Therapy Treatment Patient Details Name: [name] MRN: [id number] DOB: 1933/02/20 Today's Date: 01/24/2021   History of Present Illness [name] is a 87 y.o. female with past medical history of COPD, CHF, HTN, HDL, GERD, hypothyroidism, paroxysmal A. fib on Eliquis, DVT and arthritis who presents coming by daughter for assessment of acute left elbow and left hip pain after a fall. 01/17/21 DG Pelvis: Acute mildly impacted subcapital fracture of the left femoral neck. 01/17/21 DG L elbow: Acute transverse fracture of the olecranon with proximal retraction and substantial rotation of the proximal fragment.    PT Comments    Pt demonstrating decreased arousal this morning likely due to medication change last night due to pt agitation. Pt initially able to open eyes and attempt exercises however, pt demonstrating decreased command following and required PROM for all exercises performed. Attempted sitting EOB to improve alertness and pt unable to keep eyes open in sitting position. TotalA to come from supine to sit and back to supine with pt grimacing and moaning regarding pain in L hip. Pt left seated in chair position in bed with SCD reapplied and pillow under L shoulder to decrease swelling. Will continue to follow patient during hospitalization to improve mobility.    Recommendations for follow up therapy are one component of a multi-disciplinary discharge planning process, led by the attending physician.  Recommendations may be updated based on patient status, additional functional criteria and insurance authorization.  Follow Up Recommendations  SNF     Equipment Recommendations  None recommended by PT    Recommendations for Other Services       Precautions / Restrictions Precautions Precautions: Fall Restrictions Weight Bearing Restrictions: Yes LUE Weight Bearing: Non weight bearing LLE Weight Bearing: Weight bearing as tolerated     Mobility  Bed Mobility Overal  bed mobility: Needs Assistance Bed Mobility: Supine to Sit;Sit to Supine     Supine to sit: Total assist;HOB elevated Sit to supine: Total assist   General bed mobility comments: Pt not participating during transfer due to lethargy.    Transfers      General transfer comment: Deferred due to lethargy  Ambulation/Gait    General Gait Details: Deferred due to lethargy   Stairs             Wheelchair Mobility    Modified Rankin (Stroke Patients Only)       Balance Overall balance assessment: Needs assistance Sitting-balance support: No upper extremity supported;Feet unsupported Sitting balance-[name]: Poor Sitting balance - Comments: Requiring minA for sitting balance at EOB       Cognition Arousal/Alertness: Lethargic;Suspect due to medications Behavior During Therapy: Flat affect Overall Cognitive Status: History of cognitive impairments - at baseline        General Comments: Pt easily aroused in beginning of session and agreeable to PT treatment      Exercises General Exercises - Lower Extremity Ankle Circles/Pumps: AROM;Both;5 reps;Supine Heel Slides: PROM;Both;15 reps;Supine Hip ABduction/ADduction: PROM;Both;15 reps Other Exercises Other Exercises: Pt lethargic throughout entire session likely due to medication. Pt easily aroused initially but required multiple attempts to remain alert to therapist. Pt requiring totalA for movement of B LEs.    General Comments        Pertinent Vitals/Pain Pain Assessment: Faces Faces Pain Scale: Hurts whole lot Pain Location: L LE Pain Descriptors / Indicators: Grimacing;Guarding Pain Intervention(s): Limited activity within patient's tolerance;Monitored during session;Repositioned    Home Living  Prior Function            PT Goals (current goals can now be found in the care plan section) Acute Rehab PT Goals PT Goal Formulation: With patient/family Time For Goal  Achievement: 02/03/21 Potential to Achieve Goals: Fair Progress towards PT goals: PT to reassess next treatment    Frequency    BID      PT Plan Current plan remains appropriate    Co-evaluation              AM-PAC PT "6 Clicks" Mobility   Outcome Measure  Help needed turning from your back to your side while in a flat bed without using bedrails?: A Lot Help needed moving from lying on your back to sitting on the side of a flat bed without using bedrails?: A Lot Help needed moving to and from a bed to a chair (including a wheelchair)?: Total Help needed standing up from a chair using your arms (e.g., wheelchair or bedside chair)?: Total Help needed to walk in hospital room?: Total Help needed climbing 3-5 steps with a railing? : Total 6 Click Score: 8    End of Session Equipment Utilized During Treatment: Other (comment) (L shoulder immobilizer) Activity Tolerance: Patient limited by lethargy Patient left: in bed;with call bell/phone within reach;with bed alarm set;with family/visitor present;with SCD's reapplied;Other (comment) (In bed in chair position) Nurse Communication: Mobility status PT Visit Diagnosis: Repeated falls (R29.6);Muscle weakness (generalized) (M62.81);History of falling (Z91.81);Difficulty in walking, not elsewhere classified (R26.2);Pain Pain - Right/Left: Left Pain - part of body: Hip     Time: 1660-6301 PT Time Calculation (min) (ACUTE ONLY): 23 min  Charges:  $Therapeutic Activity: 23-37 mins                     Andrey Campanile, SPT    Andrey Campanile 01/24/2021, 1:05 PM

## 2021-01-24 NOTE — Plan of Care (Signed)
  Problem: Education: Goal: Knowledge of General Education information will improve Description: Including pain rating scale, medication(s)/side effects and non-pharmacologic comfort measures 01/24/2021 1320 by Johann Gascoigne, Cleaster Corin, RN Outcome: Adequate for Discharge 01/24/2021 1320 by Amalea Ottey, Cleaster Corin, RN Outcome: Adequate for Discharge   Problem: Health Behavior/Discharge Planning: Goal: Ability to manage health-related needs will improve 01/24/2021 1320 by Philo Kurtz, Cleaster Corin, RN Outcome: Adequate for Discharge 01/24/2021 1320 by Felisia Balcom, Cleaster Corin, RN Outcome: Adequate for Discharge   Problem: Clinical Measurements: Goal: Ability to maintain clinical measurements within normal limits will improve 01/24/2021 1320 by Kynadee Dam, Cleaster Corin, RN Outcome: Adequate for Discharge 01/24/2021 1320 by Telma Pyeatt, Cleaster Corin, RN Outcome: Adequate for Discharge Goal: Will remain free from infection 01/24/2021 1320 by Rickard Kennerly, Cleaster Corin, RN Outcome: Adequate for Discharge 01/24/2021 1320 by Maudie Shingledecker, Cleaster Corin, RN Outcome: Adequate for Discharge Goal: Diagnostic test results will improve 01/24/2021 1320 by Casidy Alberta, Cleaster Corin, RN Outcome: Adequate for Discharge 01/24/2021 1320 by Merced Hanners, Cleaster Corin, RN Outcome: Adequate for Discharge Goal: Respiratory complications will improve 01/24/2021 1320 by Marybella Ethier, Cleaster Corin, RN Outcome: Adequate for Discharge 01/24/2021 1320 by Samona Chihuahua, Cleaster Corin, RN Outcome: Adequate for Discharge Goal: Cardiovascular complication will be avoided 01/24/2021 1320 by Najwa Spillane, Cleaster Corin, RN Outcome: Adequate for Discharge 01/24/2021 1320 by Mabel Roll, Cleaster Corin, RN Outcome: Adequate for Discharge   Problem: Activity: Goal: Risk for activity intolerance will decrease 01/24/2021 1320 by Jailen Coward, Cleaster Corin, RN Outcome: Adequate for Discharge 01/24/2021 1320 by Owais Pruett, Cleaster Corin, RN Outcome: Adequate for Discharge   Problem:  Nutrition: Goal: Adequate nutrition will be maintained 01/24/2021 1320 by Adriella Essex, Cleaster Corin, RN Outcome: Adequate for Discharge 01/24/2021 1320 by Celena Lanius, Cleaster Corin, RN Outcome: Adequate for Discharge   Problem: Coping: Goal: Level of anxiety will decrease 01/24/2021 1320 by Pistol Kessenich, Cleaster Corin, RN Outcome: Adequate for Discharge 01/24/2021 1320 by Rahi Chandonnet, Cleaster Corin, RN Outcome: Adequate for Discharge   Problem: Elimination: Goal: Will not experience complications related to bowel motility 01/24/2021 1320 by Janasha Barkalow, Cleaster Corin, RN Outcome: Adequate for Discharge 01/24/2021 1320 by Azizah Lisle, Cleaster Corin, RN Outcome: Adequate for Discharge Goal: Will not experience complications related to urinary retention 01/24/2021 1320 by Kati Riggenbach, Cleaster Corin, RN Outcome: Adequate for Discharge 01/24/2021 1320 by Kara Melching, Cleaster Corin, RN Outcome: Adequate for Discharge   Problem: Pain Managment: Goal: General experience of comfort will improve 01/24/2021 1320 by Ivionna Verley, Cleaster Corin, RN Outcome: Adequate for Discharge 01/24/2021 1320 by Denice Cardon, Cleaster Corin, RN Outcome: Adequate for Discharge   Problem: Safety: Goal: Ability to remain free from injury will improve 01/24/2021 1320 by Rickardo Brinegar, Cleaster Corin, RN Outcome: Adequate for Discharge 01/24/2021 1320 by Symphanie Cederberg, Cleaster Corin, RN Outcome: Adequate for Discharge   Problem: Skin Integrity: Goal: Risk for impaired skin integrity will decrease 01/24/2021 1320 by Stellarose Cerny, Cleaster Corin, RN Outcome: Adequate for Discharge 01/24/2021 1320 by Sherika Kubicki, Cleaster Corin, RN Outcome: Adequate for Discharge

## 2021-01-24 NOTE — Progress Notes (Signed)
Report was called to Caryl Pina, Therapist, sports at [facility].

## 2021-01-24 NOTE — TOC Progression Note (Addendum)
Transition of Care [facility]) - Progression Note    Patient Details  Name: [name] MRN: [id number] Date of Birth: November 23, 1932  Transition [facility] Care [facility]) CM/SW Schuyler, RN Phone Number: 01/24/2021, 12:59 PM  Clinical Narrative:    Patient has received insurance auth approval to go to [facility] room 300, the bedside nurse to call report to [phone], daughter is in the room and is aware        Expected Discharge Plan and Services           Expected Discharge Date: 01/24/21                                     Social Determinants of Health (SDOH) Interventions    Readmission Risk Interventions Readmission Risk Prevention Plan 12/21/2020  Transportation Screening Complete  PCP or Specialist Appt within 3-5 Days Complete  HRI or Home Care Consult Complete  Social Work Consult for [place] Planning/Counseling Not Complete  SW consult not completed comments Patient is assigned to RN CM  Palliative Care Screening Not Applicable  Medication Review Press photographer) Complete  Some recent data might be hidden

## 2021-01-24 NOTE — TOC Progression Note (Signed)
Transition of Care [facility]) - Progression Note    Patient Details  Name: [name] MRN: [id number] Date of Birth: [birth year]  Transition of [facility]) CM/SW [place], RN Phone Number: 01/24/2021, 10:16 AM  Clinical Narrative:   The patient to DC to [facility] once Holli Humbles is approved, Checked with Hilda Blades at [facility] this morning and the Josem Kaufmann is still pending         Expected Discharge Plan and Services           Expected Discharge Date: 01/24/21                                     Social Determinants of Health (SDOH) Interventions    Readmission Risk Interventions Readmission Risk Prevention Plan 12/21/2020  Transportation Screening Complete  PCP or Specialist Appt within 3-5 Days Complete  HRI or [place] Complete  Social Work Consult for [place] Planning/Counseling Not Complete  SW consult not completed comments Patient is assigned to RN CM  Palliative Care Screening Not Applicable  Medication Review Press photographer) Complete  Some recent data might be hidden

## 2021-01-25 NOTE — Care Management Important Message (Signed)
Important Message  Patient Details  Name: [name] MRN: [id number] Date of Birth: [birth year]   Medicare Important Message Given:  Yes ((late documentation IM given on 10 10 22))     Loann Quill 01/25/2021, 11:02 AM

## 2021-01-28 ENCOUNTER — Emergency Department: Payer: Medicare HMO

## 2021-01-28 ENCOUNTER — Other Ambulatory Visit: Payer: Self-pay

## 2021-01-28 ENCOUNTER — Encounter: Payer: Self-pay | Admitting: Emergency Medicine

## 2021-01-28 DIAGNOSIS — N390 Urinary tract infection, site not specified: Secondary | ICD-10-CM | POA: Insufficient documentation

## 2021-01-28 DIAGNOSIS — J449 Chronic obstructive pulmonary disease, unspecified: Secondary | ICD-10-CM | POA: Diagnosis not present

## 2021-01-28 DIAGNOSIS — M545 Low back pain, unspecified: Secondary | ICD-10-CM | POA: Diagnosis not present

## 2021-01-28 DIAGNOSIS — R2232 Localized swelling, mass and lump, left upper limb: Secondary | ICD-10-CM | POA: Diagnosis not present

## 2021-01-28 DIAGNOSIS — I130 Hypertensive heart and chronic kidney disease with heart failure and stage 1 through stage 4 chronic kidney disease, or unspecified chronic kidney disease: Secondary | ICD-10-CM | POA: Insufficient documentation

## 2021-01-28 DIAGNOSIS — E039 Hypothyroidism, unspecified: Secondary | ICD-10-CM | POA: Insufficient documentation

## 2021-01-28 DIAGNOSIS — Z7982 Long term (current) use of aspirin: Secondary | ICD-10-CM | POA: Insufficient documentation

## 2021-01-28 DIAGNOSIS — Z79899 Other long term (current) drug therapy: Secondary | ICD-10-CM | POA: Diagnosis not present

## 2021-01-28 DIAGNOSIS — R410 Disorientation, unspecified: Secondary | ICD-10-CM | POA: Diagnosis not present

## 2021-01-28 DIAGNOSIS — Z96653 Presence of artificial knee joint, bilateral: Secondary | ICD-10-CM | POA: Diagnosis not present

## 2021-01-28 DIAGNOSIS — G8929 Other chronic pain: Secondary | ICD-10-CM | POA: Diagnosis not present

## 2021-01-28 DIAGNOSIS — I480 Paroxysmal atrial fibrillation: Secondary | ICD-10-CM | POA: Diagnosis not present

## 2021-01-28 DIAGNOSIS — R4182 Altered mental status, unspecified: Secondary | ICD-10-CM | POA: Diagnosis not present

## 2021-01-28 DIAGNOSIS — I5032 Chronic diastolic (congestive) heart failure: Secondary | ICD-10-CM | POA: Diagnosis not present

## 2021-01-28 DIAGNOSIS — N184 Chronic kidney disease, stage 4 (severe): Secondary | ICD-10-CM | POA: Diagnosis not present

## 2021-01-28 DIAGNOSIS — Z853 Personal history of malignant neoplasm of breast: Secondary | ICD-10-CM | POA: Diagnosis not present

## 2021-01-28 LAB — COMPREHENSIVE METABOLIC PANEL
ALT: 17 U/L (ref 0–44)
AST: 26 U/L (ref 15–41)
Albumin: 2.8 g/dL — ABNORMAL LOW (ref 3.5–5.0)
Alkaline Phosphatase: 75 U/L (ref 38–126)
Anion gap: 8 (ref 5–15)
BUN: 17 mg/dL (ref 8–23)
CO2: 26 mmol/L (ref 22–32)
Calcium: 8.9 mg/dL (ref 8.9–10.3)
Chloride: 102 mmol/L (ref 98–111)
Creatinine, Ser: 1.15 mg/dL — ABNORMAL HIGH (ref 0.44–1.00)
GFR, Estimated: 47 mL/min — ABNORMAL LOW (ref >60)
Glucose, Bld: 101 mg/dL — ABNORMAL HIGH (ref 70–99)
Potassium: 4.2 mmol/L (ref 3.5–5.1)
Sodium: 136 mmol/L (ref 135–145)
Total Bilirubin: 0.7 mg/dL (ref 0.3–1.2)
Total Protein: 6 g/dL — ABNORMAL LOW (ref 6.5–8.1)

## 2021-01-28 LAB — CBC WITH DIFFERENTIAL/PLATELET
Abs Immature Granulocytes: 0.16 10*3/uL — ABNORMAL HIGH (ref 0.00–0.07)
Basophils Absolute: 0.1 10*3/uL (ref 0.0–0.1)
Basophils Relative: 1 %
Eosinophils Absolute: 0.1 10*3/uL (ref 0.0–0.5)
Eosinophils Relative: 0 %
HCT: 42.2 % (ref 36.0–46.0)
Hemoglobin: 12.9 g/dL (ref 12.0–15.0)
Immature Granulocytes: 1 %
Lymphocytes Relative: 13 %
Lymphs Abs: 1.5 10*3/uL (ref 0.7–4.0)
MCH: 34.3 pg — ABNORMAL HIGH (ref 26.0–34.0)
MCHC: 30.6 g/dL (ref 30.0–36.0)
MCV: 112.2 fL — ABNORMAL HIGH (ref 80.0–100.0)
Monocytes Absolute: 1.3 10*3/uL — ABNORMAL HIGH (ref 0.1–1.0)
Monocytes Relative: 12 %
Neutro Abs: 8.2 10*3/uL — ABNORMAL HIGH (ref 1.7–7.7)
Neutrophils Relative %: 73 %
Platelets: 231 10*3/uL (ref 150–400)
RBC: 3.76 MIL/uL — ABNORMAL LOW (ref 3.87–5.11)
RDW: 15.2 % (ref 11.5–15.5)
Smear Review: NORMAL
WBC: 11.3 10*3/uL — ABNORMAL HIGH (ref 4.0–10.5)
nRBC: 0 % (ref 0.0–0.2)

## 2021-01-28 LAB — LACTIC ACID, PLASMA: Lactic Acid, Venous: 1.4 mmol/L (ref 0.5–1.9)

## 2021-01-28 NOTE — ED Triage Notes (Signed)
Patient to ED via [facility] from [facility] for right hand swelling. Patient states that hand has been swollen since she last fell- unable to tell RN when exactly.

## 2021-01-28 NOTE — ED Provider Notes (Signed)
Emergency Medicine Provider Triage Evaluation Note  [name] , a 87 y.o. female  was evaluated in triage.  Pt complains of increased pain, erythema, edema to left upper extremity.  Patient had fracture of the left elbow with surgical repair 9 days ago.  Increased edema, erythema, pain to the left upper extremity.  No chest pain, shortness of breath.  No fevers or chills..  Review of Systems  Positive: Edema, erythema, increased pain to the left upper extremity.  Surgery to left elbow 9 days ago Negative: Fevers, chills, chest pain  Physical Exam  There were no vitals taken for this visit. Gen:   Awake, no distress   Resp:  Normal effort  MSK:   Left upper extremity is in a sling.  Gross edema, erythema of the left upper extremity.  Sensation intact.  Capillary refill intact Other:    Medical Decision Making  Medically screening exam initiated at 5:44 PM.  Appropriate orders placed.  [name] was informed that the remainder of the evaluation will be completed by another provider, this initial triage assessment does not replace that evaluation, and the importance of remaining in the ED until their evaluation is complete.  Patient presents with increased pain, erythema, edema of the left upper extremity.  History of fracture of the left elbow with surgical repair 9 days ago.  Patient will have x-rays, labs, ultrasound at this time   [name], [name] 01/28/21 1745    Blake Divine, MD 01/29/21 [phone]

## 2021-01-29 ENCOUNTER — Other Ambulatory Visit: Payer: Self-pay

## 2021-01-29 ENCOUNTER — Emergency Department
Admission: EM | Admit: 2021-01-29 | Discharge: 2021-01-29 | Disposition: A | Discharge status: Home / Self Care | Payer: Medicare HMO | Attending: Emergency Medicine | Admitting: Emergency Medicine

## 2021-01-29 ENCOUNTER — Emergency Department: Payer: Medicare HMO

## 2021-01-29 DIAGNOSIS — R4182 Altered mental status, unspecified: Secondary | ICD-10-CM

## 2021-01-29 DIAGNOSIS — N390 Urinary tract infection, site not specified: Secondary | ICD-10-CM

## 2021-01-29 DIAGNOSIS — M7989 Other specified soft tissue disorders: Secondary | ICD-10-CM

## 2021-01-29 LAB — URINALYSIS, COMPLETE (UACMP) WITH MICROSCOPIC
Bilirubin Urine: NEGATIVE
Glucose, UA: NEGATIVE mg/dL
Ketones, ur: NEGATIVE mg/dL
Nitrite: NEGATIVE
Protein, ur: 100 mg/dL — AB
Specific Gravity, Urine: 1.014 (ref 1.005–1.030)
Squamous Epithelial / HPF: NONE SEEN (ref 0–5)
WBC, UA: >50 WBC/hpf — ABNORMAL HIGH (ref 0–5)
pH: 6 (ref 5.0–8.0)

## 2021-01-29 LAB — LACTIC ACID, PLASMA: Lactic Acid, Venous: 0.9 mmol/L (ref 0.5–1.9)

## 2021-01-29 LAB — PROTIME-INR
INR: 1.1 (ref 0.8–1.2)
Prothrombin Time: 13.8 seconds (ref 11.4–15.2)

## 2021-01-29 LAB — PROCALCITONIN: Procalcitonin: <0.1 ng/mL

## 2021-01-29 MED ORDER — CEPHALEXIN 500 MG PO CAPS
500.0000 mg | ORAL_CAPSULE | Freq: Once | ORAL | Status: AC
  Administered 2021-01-29: 500 mg via ORAL
  Filled 2021-01-29: qty 1

## 2021-01-29 MED ORDER — CEPHALEXIN 500 MG PO CAPS: 500.0000 mg | ORAL_CAPSULE | Freq: Two times a day (BID) | ORAL | 0 refills | Status: DC

## 2021-01-29 NOTE — ED Notes (Signed)
Pt appears to be sleeping.

## 2021-01-29 NOTE — ED Notes (Signed)
Attempted to call [facility] to give report. No answer.

## 2021-01-29 NOTE — ED Notes (Signed)
Attempt to call for report. No answer

## 2021-01-29 NOTE — ED Notes (Signed)
Attempt to call report, someone answered who transferred to cpod then no answer.

## 2021-01-29 NOTE — Discharge Instructions (Addendum)
These continue patient's pain medication as prescribed.  Please keep your splint on at all times but she may take her left arm out of her sling and raise it up when she is resting which will help with pain and swelling.  We have replaced her splint here in the emergency department.  Please follow-up with orthopedics as scheduled.  She has no sign of infection in the arm and no new fracture or DVT.  Patient does appear to have a urinary tract infection which could be contributing to her confusion over the past few weeks.  We are discharging her on Keflex twice daily for the next week.  Please have her urine rechecked in the next 1 week to ensure resolution.

## 2021-01-29 NOTE — ED Notes (Signed)
Called to give report. Transfer to cpod at [facility], no answer.

## 2021-01-29 NOTE — ED Notes (Signed)
Called EMS for transport back to [facility]

## 2021-01-29 NOTE — ED Provider Notes (Addendum)
[facility] Emergency Department Provider Note  ____________________________________________   Event Date/Time   First MD Initiated Contact with Patient 01/29/21 0201     (approximate)  I have reviewed the triage vital signs and the nursing notes.   HISTORY  Chief Complaint Hand Problem    HPI [name] is a 87 y.o. female with history of hypertension, hyperlipidemia, COPD, chronic kidney disease, paroxysmal atrial fibrillation, DVT, CHF who presents to the emergency department from [facility] for concerns for left upper extremity swelling.  Patient appears confused and unable to provide any history.  History is provided by patient's son over the phone.  He states that patient had a fall and was admitted to the hospital in 01/17/2021.  She had a left hip and left elbow fracture that was repaired by Dr. Harlow Mares on 01/19/2021.  He states that today when he saw her at the nursing facility where she is been staying since discharge on 01/24/2021 he noticed that her left hand was very swollen.  He states once they removed her splint the swelling improved significantly.  He denies any known fevers or new injury.  Patient denies any pain in the left arm and is complaining of back pain which appears to be chronic for her.  Son states that prior to her fall she did not have any confusion or history of dementia but since her fall, surgery and being in the hospital she has been confused.  No new head injury.        Past Medical History:  Diagnosis Date   (HFpEF) heart failure with preserved ejection fraction ([place])    a. 12/2015 Echo: EF 60-65%. no rwma, Gr2 DD, triv AI, mild MR, mildly dil LA. Mild-mod TR. PASP 48mmHg.   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 08/2010: EF 73%, no ischemia/infarct.   Hyperlipidemia    Hypertension    Hypothyroid    PAD (peripheral artery disease) ([place])    a. 08/2015 s/p PTA of R AT w/ drug-coated balloon angioplasty to R Popliteal; b. 05/2017 ABI: R 1.16, L 0.88-->stable.   PAF (paroxysmal atrial fibrillation) ([place])    a. 08/2010 s/p DCCV;  b. CHA2DS2VASc = 6-->No OAC 2/2 h/o falls.   PONV (postoperative nausea and vomiting)    nausea vomiting long ago with surgery but not recent ones    Patient Active Problem List   Diagnosis Date Noted   Fall 01/17/2021   Closed left hip fracture ([place]) 01/17/2021   Left elbow fracture 01/17/2021   Syncope and collapse 12/20/2020   Acute deep vein thrombosis (DVT) of left peroneal vein ([place]) 03/06/2018   Arterial leg ulcer ([place]) 03/06/2018   Hypertensive heart and kidney disease with chronic  diastolic congestive heart failure and stage 3 chronic kidney disease ([place]) 03/06/2018   GERD without esophagitis 03/06/2018   CKD (chronic kidney disease) stage 3, GFR 30-59 ml/min (HCC) 03/06/2018   Anemia 03/06/2018   Hypothyroidism due to acquired atrophy of thyroid 03/06/2018   Dyslipidemia 03/06/2018   Primary osteoarthritis of left knee 03/06/2018   Status post total knee replacement using cement, left 02/26/2018   Orthostatic hypotension 01/14/2016   ARF (acute renal failure) ([place]) 01/14/2016   Dehydration 01/14/2016   Protein-calorie malnutrition, severe 01/12/2016   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   PAD (peripheral artery disease) ([place]) 08/10/2014   Chronic  diastolic congestive heart failure ([place]) 02/26/2014   Renal insufficiency 09/14/2011   PAF (paroxysmal atrial fibrillation) ([place]) 08/09/2010   Hypertension 08/09/2010    Past Surgical History:  Procedure Laterality Date   ABDOMINAL EXPLORATION SURGERY     "had to go back in after hysterectomy & check on stitch dr had put near my bladder; don't know if they took it out; had to wear catheter for 1 month"   [place], ARTERY Right 09/08/2015   superficial femoral   BREAST EXCISIONAL BIOPSY Right 2007   positive   BREAST SURGERY     CARDIOVERSION  11/10/2011   Procedure: CARDIOVERSION;  Surgeon: Lelon Perla, MD;  Location: [place];  Service: Cardiovascular;  Laterality: N/A;   CARDIOVERSION  08/26/2010   CATARACT EXTRACTION W/PHACO Right 07/17/2017   Procedure: CATARACT EXTRACTION PHACO AND INTRAOCULAR LENS PLACEMENT ([place]);  Surgeon: Birder Robson, MD;  Location: [facility];  Service: Ophthalmology;  Laterality: Right;  [place] 00:44.0 AP% 14.6 CDE 6.44 FLUID PACK LOT # [id number]   CATARACT EXTRACTION W/PHACO Left 08/07/2017   Procedure: CATARACT EXTRACTION PHACO AND INTRAOCULAR LENS PLACEMENT (IOC);  Surgeon: Birder Robson, MD;  Location: [facility];  Service: Ophthalmology;  Laterality: Left;  [place] 00:44 AP% 14.1 CDE 6.32 Fluid pack lot # [id number]   COLONOSCOPY     COLONOSCOPY WITH PROPOFOL N/A 01/04/2016   Procedure: COLONOSCOPY WITH PROPOFOL;  Surgeon: Lollie Sails, MD;  Location: [facility] ENDOSCOPY;  Service: Endoscopy;  Laterality: N/A;   EYE SURGERY     HALLUX VALGUS CORRECTION     HIP CLOSED REDUCTION Left 01/19/2021   Procedure: CLOSED REDUCTION HIP;  Surgeon: Lovell Sheehan, MD;  Location: [facility];  Service: Orthopedics;  Laterality: Left;   JOINT REPLACEMENT     KYPHOPLASTY N/A 06/25/2018   Procedure: KYPHOPLASTY L3;  Surgeon: Hessie Knows, MD;  Location: [facility];  Service: Orthopedics;  Laterality: N/A;    LUMBAR DISC SURGERY     MASTECTOMY Right 2013   positive   ORIF ELBOW FRACTURE Left 01/19/2021   Procedure: OPEN REDUCTION INTERNAL FIXATION (ORIF) ELBOW/OLECRANON FRACTURE;  Surgeon: Lovell Sheehan, MD;  Location: [facility];  Service: Orthopedics;  Laterality: Left;   PERCUTANEOUS PINNING Left 01/19/2021   Procedure: PERCUTANEOUS PINNING EXTREMITY;  Surgeon: Lovell Sheehan, MD;  Location: [facility];  Service: Orthopedics;  Laterality: Left;   PERIPHERAL VASCULAR CATHETERIZATION N/A 09/08/2015   Procedure: Abdominal Aortogram w/Lower Extremity;  Surgeon: Wellington Hampshire, MD;  Location: [place] [facility];  Service: Cardiovascular;  Laterality: N/A;   PERIPHERAL VASCULAR CATHETERIZATION  09/08/2015   Procedure: Peripheral Vascular Balloon Angioplasty;  Surgeon: Wellington Hampshire, MD;  Location: [place] [facility];  Service: Cardiovascular;;   REVISION TOTAL  KNEE ARTHROPLASTY Right    rt.ankle ulcer surgery  2013   TEMPOROMANDIBULAR JOINT ARTHROPLASTY     TMJ ARTHROPLASTY     TONSILLECTOMY     TOTAL KNEE ARTHROPLASTY Right    TOTAL KNEE ARTHROPLASTY Left 02/26/2018   Procedure: TOTAL KNEE ARTHROPLASTY;  Surgeon: Corky Mull, MD;  Location: [facility];  Service: Orthopedics;  Laterality: Left;   VASCULAR SURGERY      Prior to Admission medications   Medication Sig Start Date End Date Taking? Authorizing Provider  cephALEXin (KEFLEX) 500 MG capsule Take 1 capsule (500 mg total) by mouth 2 (two) times daily. 01/29/21  Yes Cinco, Delice Bison, DO  acetaminophen (TYLENOL) 325 MG tablet Take 650 mg by mouth every 4 (four) hours as needed (pain).  03/03/18   [provider]  acetaminophen (TYLENOL) 500 MG tablet Take 1,000 mg by mouth at bedtime as needed for moderate pain or headache.    [provider]  amiodarone (PACERONE) 200 MG tablet Take 1 tablet (200 mg total) by mouth daily. 01/24/21   Fritzi Mandes, MD  aspirin EC 81 MG tablet Take 81 mg by mouth daily.    [provider]  budesonide (ENTOCORT EC) 3 MG 24 hr capsule Take 9 mg by mouth every morning. 09/07/20   [provider]  calcium-vitamin D (OSCAL WITH D) 500-200 MG-UNIT per tablet Take 1 tablet by mouth daily with breakfast.    [provider]  Cholecalciferol (VITAMIN D3) 5000 units CAPS Take 5,000 Units by mouth daily.     [provider]  docusate sodium (COLACE) 100 MG capsule Take 100 mg by mouth daily as needed for mild constipation.    [provider]  enoxaparin (LOVENOX) 30 MG/0.3ML injection Inject 0.3 mLs (30 mg total) into the skin daily for 12 days. 01/21/21 02/02/21  Carlynn Spry, PA-C  famotidine (PEPCID) 40 MG tablet Take 40 mg by mouth at bedtime. 09/29/20   [provider]  HYDROcodone-acetaminophen (NORCO/VICODIN) 5-325 MG tablet Take 1 tablet by mouth every 4 (four) hours as needed for moderate pain or severe pain. 01/21/21   Carlynn Spry, PA-C  lactose free nutrition (BOOST) LIQD Take 237 mLs by mouth daily.    [provider]  levothyroxine (SYNTHROID) 112 MCG tablet Take 112 mcg by mouth daily before breakfast. 09/20/20   [provider]  metoprolol succinate (TOPROL-XL) 50 MG 24 hr tablet Take 25-50 mg by mouth 2 (two) times daily. 1 tab in am and 1/2 tab in pm 09/30/20   [provider]  Multiple Vitamins-Minerals (CENTROVITE) TABS Take 1 tablet by mouth daily. 03/04/18   [provider]  PARoxetine (PAXIL) 20 MG tablet Take 1 tablet (20 mg total) by mouth daily. 03/19/18   Gerlene Fee, NP  spironolactone (ALDACTONE) 25 MG tablet Take 1 tablet (25 mg total) by mouth daily. 03/19/18   Gerlene Fee, NP    Allergies Other, Amlodipine, Ciprofloxacin, Doxycycline, Lipitor [atorvastatin], Morphine and related, Macrobid [nitrofurantoin macrocrystal], Penicillins, and Sulfa antibiotics  Family History  Problem Relation Age of Onset   Heart attack Father    Coronary artery disease Sister 91        MI   Breast cancer Maternal Aunt     Social History Social History   Tobacco Use   Smoking status: Never   Smokeless tobacco: Never  Vaping Use   Vaping Use: Never used  Substance Use Topics   Alcohol use: No   Drug use:  No    Review of Systems Level 5 caveat secondary to confusion  ____________________________________________   PHYSICAL EXAM:  VITAL SIGNS: ED Triage Vitals  Enc Vitals Group     BP 01/28/21 1806 (!) 145/66     Pulse Rate 01/28/21 1806 (!) 106     Resp 01/28/21 1828 18     Temp 01/28/21 1806 98.2 F (36.8 C)     Temp Source 01/28/21 1806 Oral     SpO2 01/28/21 1806 97 %     Weight 01/28/21 1828 130 lb (59 kg)     Height 01/28/21 1828 5\' 8"  (1.727 m)     Head Circumference --      Peak Flow --      Pain Score 01/28/21 1828 0     Pain Loc --      Pain Edu? --      Excl. in [place]? --    CONSTITUTIONAL: Alert and oriented person only.  Elderly.  In no distress.  Afebrile. HEAD: Normocephalic, atraumatic EYES: Conjunctivae clear, pupils appear equal, EOM appear intact ENT: normal nose; moist mucous membranes NECK: Supple, normal ROM CARD: Irregularly irregular and rate controlled, S1 and S2 appreciated; no murmurs, no clicks, no rubs, no gallops RESP: Normal chest excursion without splinting or tachypnea; breath sounds clear and equal bilaterally; no wheezes, no rhonchi, no rales, no hypoxia or respiratory distress, speaking full sentences ABD/GI: Normal bowel sounds; non-distended; soft, non-tender, no rebound, no guarding, no peritoneal signs, no hepatosplenomegaly BACK: The back appears normal EXT: Surgical incisions over the left hip and left elbow are clean, dry and intact and covered with dressings.  She does have associated soft tissue swelling and ecchymosis with slight warmth to the left upper extremity but no erythema, induration or fluctuance.  She has a 2+ left radial and DP pulse. SKIN: Normal color for age and race; warm; no rash on  exposed skin NEURO: Moves all extremities equally PSYCH: The patient's mood and manner are appropriate.     ____________________________________________   LABS (all labs ordered are listed, but only abnormal results are displayed)  Labs Reviewed  COMPREHENSIVE METABOLIC PANEL - Abnormal; Notable for the following components:      Result Value   Glucose, Bld 101 (*)    Creatinine, Ser 1.15 (*)    Total Protein 6.0 (*)    Albumin 2.8 (*)    GFR, Estimated 47 (*)    All other components within normal limits  CBC WITH DIFFERENTIAL/PLATELET - Abnormal; Notable for the following components:   WBC 11.3 (*)    RBC 3.76 (*)    MCV 112.2 (*)    MCH 34.3 (*)    Neutro Abs 8.2 (*)    Monocytes Absolute 1.3 (*)    Abs Immature Granulocytes 0.16 (*)    All other components within normal limits  URINALYSIS, COMPLETE (UACMP) WITH MICROSCOPIC - Abnormal; Notable for the following components:   Color, Urine AMBER (*)    APPearance CLOUDY (*)    Hgb urine dipstick SMALL (*)    Protein, ur 100 (*)    Leukocytes,Ua LARGE (*)    WBC, UA >50 (*)    Bacteria, UA MANY (*)    All other components within normal limits  URINE CULTURE  LACTIC ACID, PLASMA  LACTIC ACID, PLASMA  PROTIME-INR  PROCALCITONIN   ____________________________________________  EKG   Date: 01/29/2021 3:51 AM  Rate: 94  Rhythm: Atrial fibrillation  QRS Axis: normal  Intervals: normal  ST/T  Wave abnormalities: normal  Conduction Disutrbances: none  Narrative Interpretation: A. fib, bifascicular block, artifact, no change compared to previous      ____________________________________________  RADIOLOGY I, Trystan Akhtar Trippett, personally viewed and evaluated these images (plain radiographs) as part of my medical decision making, as well as reviewing the written report by the radiologist.  ED MD interpretation: X-rays show no acute abnormality of the left upper extremity.  CT head shows no acute abnormality.  Venous  Doppler shows no DVT in the left arm.  Official radiology report(s): DG Forearm Left  Result Date: 01/28/2021 CLINICAL DATA:  Increased pain after surgical repair of elbow EXAM: LEFT FOREARM - 2 VIEW; LEFT HUMERUS - 2+ VIEW COMPARISON:  07/04/2020. FINDINGS: Status post pin fixation of a left olecranon fracture, in near anatomic alignment. There is no evidence of additional fracture or other focal bone lesions. Superficial skin staples. Soft tissues are otherwise unremarkable. IMPRESSION: Expected postoperative appearance, status post pin fixation of a left olecranon fracture. Electronically Signed   By: Merilyn Baba M.D.   On: 01/28/2021 19:52   CT HEAD WO CONTRAST (5MM)  Result Date: 01/29/2021 CLINICAL DATA:  Altered mental status EXAM: CT HEAD WITHOUT CONTRAST TECHNIQUE: Contiguous axial images were obtained from the base of the skull through the vertex without intravenous contrast. COMPARISON:  01/17/2021 FINDINGS: Brain: Mild atrophic changes and chronic white matter ischemic changes are seen. No findings to suggest acute hemorrhage, acute infarction or space-occupying mass lesion are noted. Vascular: No hyperdense vessel or unexpected calcification. Skull: Normal. Negative for fracture or focal lesion. Sinuses/Orbits: No acute finding. Other: None. IMPRESSION: Chronic atrophic and ischemic changes without acute abnormality. Electronically Signed   By: Inez Catalina M.D.   On: 01/29/2021 03:00   US Venous Img Upper Uni Left  Result Date: 01/28/2021 CLINICAL DATA:  Recent repair of left elbow fracture with increasing swelling of the left arm, initial encounter EXAM: LEFT UPPER EXTREMITY VENOUS DOPPLER ULTRASOUND TECHNIQUE: Gray-scale sonography with graded compression, as well as color Doppler and duplex ultrasound were performed to evaluate the upper extremity deep venous system from the level of the subclavian vein and including the jugular, axillary, basilic, radial, ulnar and upper cephalic  vein. Spectral Doppler was utilized to evaluate flow at rest and with distal augmentation maneuvers. COMPARISON:  None. FINDINGS: Contralateral Subclavian Vein: Respiratory phasicity is normal and symmetric with the symptomatic side. No evidence of thrombus. Normal compressibility. Internal Jugular Vein: No evidence of thrombus. Normal compressibility, respiratory phasicity and response to augmentation. Subclavian Vein: No evidence of thrombus. Normal compressibility, respiratory phasicity and response to augmentation. Axillary Vein: No evidence of thrombus. Normal compressibility, respiratory phasicity and response to augmentation. Cephalic Vein: No evidence of thrombus. Normal compressibility, respiratory phasicity and response to augmentation. Basilic Vein: No evidence of thrombus. Normal compressibility, respiratory phasicity and response to augmentation. Brachial Veins: No evidence of thrombus. Normal compressibility, respiratory phasicity and response to augmentation. Radial Veins: No evidence of thrombus. Normal compressibility, respiratory phasicity and response to augmentation. Ulnar Veins: No evidence of thrombus. Normal compressibility, respiratory phasicity and response to augmentation. Venous Reflux:  None visualized. Other Findings:  None visualized. IMPRESSION: No evidence of DVT within the left upper extremity. Electronically Signed   By: Inez Catalina M.D.   On: 01/28/2021 19:51   DG Humerus Left  Result Date: 01/28/2021 CLINICAL DATA:  Increased pain after surgical repair of elbow EXAM: LEFT FOREARM - 2 VIEW; LEFT HUMERUS - 2+ VIEW COMPARISON:  07/04/2020. FINDINGS: Status post pin  fixation of a left olecranon fracture, in near anatomic alignment. There is no evidence of additional fracture or other focal bone lesions. Superficial skin staples. Soft tissues are otherwise unremarkable. IMPRESSION: Expected postoperative appearance, status post pin fixation of a left olecranon fracture.  Electronically Signed   By: Merilyn Baba M.D.   On: 01/28/2021 19:52    ____________________________________________   PROCEDURES  Procedure(s) performed (including Critical Care):  Procedures     SPLINT APPLICATION Date/Time: 0:26 AM Authorized by: Cyril Mourning Gilday Consent: Verbal consent obtained. Risks and benefits: risks, benefits and alternatives were discussed Consent given by: patient Splint applied by:  technician Location details: Left arm Splint type: Posterior splint in the 100 degrees of flexion Supplies used: orthoglass, padding, ace wrap, sling Post-procedure: The splinted body part was neurovascularly unchanged following the procedure. Patient tolerance: Patient tolerated the procedure well with no immediate complications.   ____________________________________________   INITIAL IMPRESSION / ASSESSMENT AND PLAN / ED COURSE  As part of my medical decision making, I reviewed the following data within the [place] History obtained from family, Nursing notes reviewed and incorporated, Labs reviewed , EKG interpreted , Old EKG reviewed, Radiograph reviewed , CT and ultrasound reviewed, and Notes from prior ED visits         Patient here with left upper extremity swelling.  I suspect that most of this is from gravity from keeping her arm down against her side.  Son reports that he is worried that the splint that she had on may have been too tight as he states as soon as it was removed the swelling in her hands significantly improved.  She has no obvious sign of cellulitis on exam.  I do not think this is a postoperative infection.  She does have a mild leukocytosis but this actually appears to be improving since her fall.  Lactic is normal.  We will check a procalcitonin.  X-rays of the left arm show no new acute abnormality.  Doppler of the left upper extremity shows no DVT.  She has a strong arterial pulse and no sign of compartment syndrome.  Son is  also concerned that she has been confused since she fell and was in the hospital.  We discussed that pain, recent hospitalization and surgery could cause delirium and deconditioning but could take some time to improve.  Will obtain head CT, urine to make sure there is nothing acute that could be causing her change in mental status.  We discussed that if work-up is unremarkable that she will be discharged back to her nursing facility.  Son is comfortable with this plan.  We will replace her posterior left upper extremity splint that appears per the operative note by Dr. Harlow Mares to have been in 100 degrees of flexion at the elbow.  ED PROGRESS  Procalcitonin is negative.  I have low suspicion for postoperative infection.  We have replaced her splint and she remains neurovascularly intact distally.  CT head shows no acute abnormality.   Patient's urine does appear infected.  We will add on a urine culture.  It appears she has previously had positive blood cultures in 2019 that grew E. coli sensitive to cephalosporins.  She does have multiple antibiotic allergies.  Will discharge on Keflex.  I do not feel at this time she needs admission as her altered mental status has been ongoing for a couple of weeks now and she is in a nursing facility.  When I previously spoke to her  son he was comfortable with this plan.  Will discharge.   At this time, I do not feel there is any life-threatening condition present. I have reviewed, interpreted and discussed all results (EKG, imaging, lab, urine as appropriate) and exam findings with patient/family. I have reviewed nursing notes and appropriate previous records.  I feel the patient is safe to be discharged home without further emergent workup and can continue workup as an outpatient as needed. Discussed usual and customary return precautions. Patient/family verbalize understanding and are comfortable with this plan.  Outpatient follow-up has been provided as needed. All  questions have been answered.   6:27 AM  Updated patient's son by phone with her test results. ____________________________________________   FINAL CLINICAL IMPRESSION(S) / ED DIAGNOSES  Final diagnoses:  Left upper extremity swelling  Acute UTI  Altered mental status, unspecified altered mental status type     ED Discharge Orders          Ordered    cephALEXin (KEFLEX) 500 MG capsule  2 times daily        01/29/21 0437            *Please note:  Pricilla Holm Gangl was evaluated in Emergency Department on 01/29/2021 for the symptoms described in the history of present illness. She was evaluated in the context of the global COVID-19 pandemic, which necessitated consideration that the patient might be at risk for infection with the SARS-CoV-2 virus that causes COVID-19. Institutional protocols and algorithms that pertain to the evaluation of patients at risk for COVID-19 are in a state of rapid change based on information released by regulatory bodies including the CDC and federal and state organizations. These policies and algorithms were followed during the patient's care in the ED.  Some ED evaluations and interventions may be delayed as a result of limited staffing during and the pandemic.*   Note:  This document was prepared using Dragon voice recognition software and may include unintentional dictation errors.    [place], Delice Bison, DO 01/29/21 [place], Delice Bison, DO 01/29/21 [phone]

## 2021-02-01 LAB — URINE CULTURE: Culture: >=100000 — AB

## 2021-02-02 NOTE — Consult Note (Signed)
ED Antimicrobial Stewardship Positive Culture Follow Up   [name] is an 87 y.o. female who presented to [facility] on 01/29/2021 with a chief complaint of  Chief Complaint  Patient presents with   Hand Problem    Recent Results (from the past 720 hour(s))  Resp Panel by RT-PCR (Flu A&B, Covid) Nasopharyngeal Swab     Status: None   Collection Time: 01/17/21  8:16 PM   Specimen: Nasopharyngeal Swab; Nasopharyngeal(NP) swabs in vial transport medium  Result Value Ref Range Status   SARS Coronavirus 2 by RT PCR NEGATIVE NEGATIVE Final    Comment: (NOTE) SARS-CoV-2 target nucleic acids are NOT DETECTED.  The SARS-CoV-2 RNA is generally detectable in upper respiratory specimens during the acute phase of infection. The lowest concentration of SARS-CoV-2 viral copies this assay can detect is 138 copies/mL. A negative result does not preclude SARS-Cov-2 infection and should not be used as the sole basis for treatment or other patient management decisions. A negative result may occur with  improper specimen collection/handling, submission of specimen other than nasopharyngeal swab, presence of viral mutation(s) within the areas targeted by this assay, and inadequate number of viral copies(<138 copies/mL). A negative result must be combined with clinical observations, patient history, and epidemiological information. The expected result is Negative.  Fact Sheet for Patients:  EntrepreneurPulse.com.au  Fact Sheet for Healthcare Providers:  IncredibleEmployment.be  This test is no t yet approved or cleared by the [place] FDA and  has been authorized for detection and/or diagnosis of SARS-CoV-2 by FDA under an Emergency Use Authorization (EUA). This EUA will remain  in effect (meaning this test can be used) for the duration of the COVID-19 declaration under Section 564(b)(1) of the Act, 21 U.S.C.section 360bbb-3(b)(1), unless the authorization  is terminated  or revoked sooner.       Influenza A by PCR NEGATIVE NEGATIVE Final   Influenza B by PCR NEGATIVE NEGATIVE Final    Comment: (NOTE) The Xpert Xpress SARS-CoV-2/FLU/RSV plus assay is intended as an aid in the diagnosis of influenza from Nasopharyngeal swab specimens and should not be used as a sole basis for treatment. Nasal washings and aspirates are unacceptable for Xpert Xpress SARS-CoV-2/FLU/RSV testing.  Fact Sheet for Patients: EntrepreneurPulse.com.au  Fact Sheet for Healthcare Providers: IncredibleEmployment.be  This test is not yet approved or cleared by the [place] FDA and has been authorized for detection and/or diagnosis of SARS-CoV-2 by FDA under an Emergency Use Authorization (EUA). This EUA will remain in effect (meaning this test can be used) for the duration of the COVID-19 declaration under Section 564(b)(1) of the Act, 21 U.S.C. section 360bbb-3(b)(1), unless the authorization is terminated or revoked.  Performed at [facility], [address] [place], [place] [address]   Resp Panel by RT-PCR (Flu A&B, Covid) Nasopharyngeal Swab     Status: None   Collection Time: 01/24/21 10:22 AM   Specimen: Nasopharyngeal Swab; Nasopharyngeal(NP) swabs in vial transport medium  Result Value Ref Range Status   SARS Coronavirus 2 by RT PCR NEGATIVE NEGATIVE Final    Comment: (NOTE) SARS-CoV-2 target nucleic acids are NOT DETECTED.  The SARS-CoV-2 RNA is generally detectable in upper respiratory specimens during the acute phase of infection. The lowest concentration of SARS-CoV-2 viral copies this assay can detect is 138 copies/mL. A negative result does not preclude SARS-Cov-2 infection and should not be used as the sole basis for treatment or other patient management decisions. A negative result may occur with  improper specimen collection/handling, submission of specimen other than nasopharyngeal  swab, presence of viral mutation(s) within the areas targeted by this assay, and inadequate number of viral copies(<138 copies/mL). A negative result must be combined with clinical observations, patient history, and epidemiological information. The expected result is Negative.  Fact Sheet for Patients:  EntrepreneurPulse.com.au  Fact Sheet for Healthcare Providers:  IncredibleEmployment.be  This test is no t yet approved or cleared by the [place] FDA and  has been authorized for detection and/or diagnosis of SARS-CoV-2 by FDA under an Emergency Use Authorization (EUA). This EUA will remain  in effect (meaning this test can be used) for the duration of the COVID-19 declaration under Section 564(b)(1) of the Act, 21 U.S.C.section 360bbb-3(b)(1), unless the authorization is terminated  or revoked sooner.       Influenza A by PCR NEGATIVE NEGATIVE Final   Influenza B by PCR NEGATIVE NEGATIVE Final    Comment: (NOTE) The Xpert Xpress SARS-CoV-2/FLU/RSV plus assay is intended as an aid in the diagnosis of influenza from Nasopharyngeal swab specimens and should not be used as a sole basis for treatment. Nasal washings and aspirates are unacceptable for Xpert Xpress SARS-CoV-2/FLU/RSV testing.  Fact Sheet for Patients: EntrepreneurPulse.com.au  Fact Sheet for Healthcare Providers: IncredibleEmployment.be  This test is not yet approved or cleared by the [place] FDA and has been authorized for detection and/or diagnosis of SARS-CoV-2 by FDA under an Emergency Use Authorization (EUA). This EUA will remain in effect (meaning this test can be used) for the duration of the COVID-19 declaration under Section 564(b)(1) of the Act, 21 U.S.C. section 360bbb-3(b)(1), unless the authorization is terminated or revoked.  Performed at [facility], [address] [place], [place] [address]   Urine  Culture     Status: Abnormal   Collection Time: 01/29/21  4:04 AM   Specimen: Urine, Random  Result Value Ref Range Status   Specimen Description   Final    URINE, RANDOM Performed at [facility], [address] [place], [place] [address]    Special Requests   Final    NONE Performed at [facility], [address] [place], [place] [address]    Culture (A)  Final    >=100,000 COLONIES/mL CITROBACTER KOSERI 50,000 COLONIES/mL CITROBACTER BRAAKII    Report Status 02/01/2021 FINAL  Final   Organism ID, Bacteria CITROBACTER KOSERI (A)  Final   Organism ID, Bacteria CITROBACTER BRAAKII (A)  Final      Susceptibility   Citrobacter braakii - MIC*    CEFAZOLIN >=64 RESISTANT Resistant     CEFEPIME <=0.12 SENSITIVE Sensitive     CEFTRIAXONE >=64 RESISTANT Resistant     CIPROFLOXACIN <=0.25 SENSITIVE Sensitive     GENTAMICIN <=1 SENSITIVE Sensitive     IMIPENEM <=0.25 SENSITIVE Sensitive     NITROFURANTOIN <=16 SENSITIVE Sensitive     TRIMETH/SULFA <=20 SENSITIVE Sensitive     PIP/TAZO 16 SENSITIVE Sensitive     * 50,000 COLONIES/mL CITROBACTER BRAAKII   Citrobacter koseri - MIC*    CEFAZOLIN <=4 SENSITIVE Sensitive     CEFEPIME <=0.12 SENSITIVE Sensitive     CEFTRIAXONE <=0.25 SENSITIVE Sensitive     CIPROFLOXACIN <=0.25 SENSITIVE Sensitive     GENTAMICIN 8 INTERMEDIATE Intermediate     IMIPENEM <=0.25 SENSITIVE Sensitive     NITROFURANTOIN <=16 SENSITIVE Sensitive     TRIMETH/SULFA <=20 SENSITIVE Sensitive     PIP/TAZO 8 SENSITIVE Sensitive     * >=100,000 COLONIES/mL CITROBACTER  KOSERI    [x]  Treated with cephalexin, organism resistant to prescribed antimicrobial  Spoke to pt son to notify to prescription change   New antibiotic prescription: Fosfomycin 3g x1   Called in new prescription to [facility]   ED Provider: Dr. Vivi Martens, PharmD Pharmacy Resident  02/02/2021 7:23 PM

## 2021-02-11 ENCOUNTER — Ambulatory Visit: Payer: Medicare HMO | Admitting: Nurse Practitioner

## 2021-02-11 ENCOUNTER — Encounter: Payer: Self-pay | Admitting: Nurse Practitioner

## 2021-02-11 NOTE — Progress Notes (Deleted)
Office Visit    Patient Name: Karely Hurtado Scherman Date of Encounter: 02/11/2021  Primary Care Provider:  Rusty Aus, MD Primary Cardiologist:  Kathlyn Sacramento, MD  Chief Complaint    ***  Past Medical History    Past Medical History:  Diagnosis Date   (HFpEF) heart failure with preserved ejection fraction ([place])    a. 12/2015 Echo: EF 60-65%. no rwma, Gr2 DD, triv AI, mild MR, mildly dil LA. Mild-mod TR. PASP 86mmHg.   Anemia    Arthritis    "back, hands" (09/08/2015)   Cancer of right breast ([place]) 2007, 2013   right breast- 2007 radiation-mastectomy   Chronic lower back pain    CKD (chronic kidney disease), stage IV (HCC)    Complication of anesthesia    "took me about 1 week to know what was going on after one of my knee ORs"   COPD (chronic obstructive pulmonary disease) (HCC)    DDD (degenerative disc disease), lumbar    Depression    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   DVT (deep venous thrombosis) (HCC)    Edema    FEET/ANKLES   GERD (gastroesophageal reflux disease)    History of sepsis    History of stress test    a. 08/2010: EF 73%, no ischemia/infarct.   Hyperlipidemia    Hypertension    Hypothyroid    PAD (peripheral artery disease) ([place])    a. 08/2015 s/p PTA of R AT w/ drug-coated balloon angioplasty to R Popliteal; b. 05/2017 ABI: R 1.16, L 0.88-->stable.   PAF (paroxysmal atrial fibrillation) ([place])    a. 08/2010 s/p DCCV;  b. CHA2DS2VASc = 6-->No OAC 2/2 h/o falls.   PONV (postoperative nausea and vomiting)    nausea vomiting long ago with surgery but not recent ones   Past Surgical History:  Procedure Laterality Date   ABDOMINAL EXPLORATION SURGERY     "had to go back in after hysterectomy & check on stitch dr had put near my bladder; don't know if they took it out; had to wear catheter for 1 month"   [place], ARTERY Right 09/08/2015   superficial femoral    BREAST EXCISIONAL BIOPSY Right 2007   positive   BREAST SURGERY     CARDIOVERSION  11/10/2011   Procedure: CARDIOVERSION;  Surgeon: Lelon Perla, MD;  Location: [facility] OR;  Service: Cardiovascular;  Laterality: N/A;   CARDIOVERSION  08/26/2010   CATARACT EXTRACTION W/PHACO Right 07/17/2017   Procedure: CATARACT EXTRACTION PHACO AND INTRAOCULAR LENS PLACEMENT ([place]);  Surgeon: Birder Robson, MD;  Location: [facility] ORS;  Service: Ophthalmology;  Laterality: Right;  [place] 00:44.0 AP% 14.6 CDE 6.44 FLUID PACK LOT # [id number]   CATARACT EXTRACTION W/PHACO Left 08/07/2017   Procedure: CATARACT EXTRACTION PHACO AND INTRAOCULAR LENS PLACEMENT (IOC);  Surgeon: Birder Robson, MD;  Location: [facility] ORS;  Service: Ophthalmology;  Laterality: Left;  [place] 00:44 AP% 14.1 CDE 6.32 Fluid pack lot # [id number]   COLONOSCOPY     COLONOSCOPY WITH PROPOFOL N/A 01/04/2016   Procedure: COLONOSCOPY WITH PROPOFOL;  Surgeon: Lollie Sails, MD;  Location: [facility] ENDOSCOPY;  Service: Endoscopy;  Laterality: N/A;   EYE SURGERY     HALLUX VALGUS CORRECTION     HIP CLOSED REDUCTION Left 01/19/2021   Procedure: CLOSED REDUCTION HIP;  Surgeon: Lovell Sheehan, MD;  Location: [facility] ORS;  Service: Orthopedics;  Laterality: Left;   JOINT REPLACEMENT     KYPHOPLASTY N/A 06/25/2018   Procedure: KYPHOPLASTY L3;  Surgeon: Hessie Knows, MD;  Location: [facility] ORS;  Service: Orthopedics;  Laterality: N/A;   LUMBAR DISC SURGERY     MASTECTOMY Right 2013   positive   ORIF ELBOW FRACTURE Left 01/19/2021   Procedure: OPEN REDUCTION INTERNAL FIXATION (ORIF) ELBOW/OLECRANON FRACTURE;  Surgeon: Lovell Sheehan, MD;  Location: [facility] ORS;  Service: Orthopedics;  Laterality: Left;   PERCUTANEOUS PINNING Left 01/19/2021   Procedure: PERCUTANEOUS PINNING EXTREMITY;  Surgeon: Lovell Sheehan, MD;  Location: [facility] ORS;  Service: Orthopedics;  Laterality: Left;   PERIPHERAL VASCULAR CATHETERIZATION N/A 09/08/2015   Procedure: Abdominal Aortogram w/Lower  Extremity;  Surgeon: Wellington Hampshire, MD;  Location: [place] CV LAB;  Service: Cardiovascular;  Laterality: N/A;   PERIPHERAL VASCULAR CATHETERIZATION  09/08/2015   Procedure: Peripheral Vascular Balloon Angioplasty;  Surgeon: Wellington Hampshire, MD;  Location: [place] [facility] LAB;  Service: Cardiovascular;;   REVISION TOTAL KNEE ARTHROPLASTY Right    rt.ankle ulcer surgery  2013   TEMPOROMANDIBULAR JOINT ARTHROPLASTY     TMJ ARTHROPLASTY     TONSILLECTOMY     TOTAL KNEE ARTHROPLASTY Right    TOTAL KNEE ARTHROPLASTY Left 02/26/2018   Procedure: TOTAL KNEE ARTHROPLASTY;  Surgeon: Corky Mull, MD;  Location: [facility] ORS;  Service: Orthopedics;  Laterality: Left;   VASCULAR SURGERY      Allergies  Allergies  Allergen Reactions   Other Other (See Comments)    NO BP, VENIPUNCTURE, OR ACCESS IN RIGHT ARM - S/P MASTECTOMY ON RIGHT   Amlodipine Nausea And Vomiting   Ciprofloxacin Nausea Only   Doxycycline Itching and Other (See Comments)    shaky   Lipitor [Atorvastatin] Other (See Comments)    Joint pain    Morphine And Related Nausea And Vomiting   Macrobid [Nitrofurantoin Macrocrystal] Rash   Penicillins Rash and Other (See Comments)    Has patient had a PCN reaction causing immediate rash, facial/tongue/throat swelling, SOB or lightheadedness with hypotension: yes Has patient had a PCN reaction causing severe rash involving mucus membranes or skin necrosis: no Has patient had a PCN reaction that required hospitalization no Has patient had a PCN reaction occurring within the last 10 years: no If all of the above answers are "NO", then may proceed with Cephalosporin use.    Sulfa Antibiotics Rash    History of Present Illness    ***  Home Medications    Current Outpatient Medications  Medication Sig Dispense Refill   acetaminophen (TYLENOL) 325 MG tablet Take 650 mg by mouth every 4 (four) hours as needed (pain).      acetaminophen (TYLENOL) 500 MG tablet Take 1,000 mg by  mouth at bedtime as needed for moderate pain or headache.     amiodarone (PACERONE) 200 MG tablet Take 1 tablet (200 mg total) by mouth daily. 30 tablet 0   aspirin EC 81 MG tablet Take 81 mg by mouth daily.     budesonide (ENTOCORT EC) 3 MG 24 hr capsule Take 9 mg by mouth every morning.     calcium-vitamin D (OSCAL WITH D) 500-200 MG-UNIT per tablet Take 1 tablet by mouth daily with breakfast.     cephALEXin (KEFLEX) 500 MG capsule Take 1 capsule (500 mg total) by mouth 2 (two) times daily. 14 capsule 0   Cholecalciferol (VITAMIN D3) 5000 units CAPS Take 5,000 Units by mouth daily.  docusate sodium (COLACE) 100 MG capsule Take 100 mg by mouth daily as needed for mild constipation.     enoxaparin (LOVENOX) 30 MG/0.3ML injection Inject 0.3 mLs (30 mg total) into the skin daily for 12 days. 3.6 mL 0   famotidine (PEPCID) 40 MG tablet Take 40 mg by mouth at bedtime.     HYDROcodone-acetaminophen (NORCO/VICODIN) 5-325 MG tablet Take 1 tablet by mouth every 4 (four) hours as needed for moderate pain or severe pain. 30 tablet 0   lactose free nutrition (BOOST) LIQD Take 237 mLs by mouth daily.     levothyroxine (SYNTHROID) 112 MCG tablet Take 112 mcg by mouth daily before breakfast.     metoprolol succinate (TOPROL-XL) 50 MG 24 hr tablet Take 25-50 mg by mouth 2 (two) times daily. 1 tab in am and 1/2 tab in pm     Multiple Vitamins-Minerals (CENTROVITE) TABS Take 1 tablet by mouth daily.     PARoxetine (PAXIL) 20 MG tablet Take 1 tablet (20 mg total) by mouth daily. 30 tablet 0   spironolactone (ALDACTONE) 25 MG tablet Take 1 tablet (25 mg total) by mouth daily. 30 tablet 0   No current facility-administered medications for this visit.     Review of Systems    ***.  All other systems reviewed and are otherwise negative except as noted above.  Physical Exam    VS:  There were no vitals taken for this visit. , BMI There is no height or weight on file to calculate BMI.     GEN: Well  nourished, well developed, in no acute distress. HEENT: normal. Neck: Supple, no JVD, carotid bruits, or masses. Cardiac: RRR, no murmurs, rubs, or gallops. No clubbing, cyanosis, edema.  Radials/DP/PT 2+ and equal bilaterally.  Respiratory:  Respirations regular and unlabored, clear to auscultation bilaterally. GI: Soft, nontender, nondistended, BS + x 4. MS: no deformity or atrophy. Skin: warm and dry, no rash. Neuro:  Strength and sensation are intact. Psych: Normal affect.  Accessory Clinical Findings    ECG personally reviewed by me today - *** - no acute changes.  Lab Results  Component Value Date   WBC 11.3 (H) 01/28/2021   HGB 12.9 01/28/2021   HCT 42.2 01/28/2021   MCV 112.2 (H) 01/28/2021   PLT 231 01/28/2021   Lab Results  Component Value Date   CREATININE 1.15 (H) 01/28/2021   BUN 17 01/28/2021   NA 136 01/28/2021   K 4.2 01/28/2021   CL 102 01/28/2021   CO2 26 01/28/2021   Lab Results  Component Value Date   ALT 17 01/28/2021   AST 26 01/28/2021   ALKPHOS 75 01/28/2021   BILITOT 0.7 01/28/2021   No results found for: CHOL, HDL, LDLCALC, LDLDIRECT, TRIG, CHOLHDL  Lab Results  Component Value Date   HGBA1C 5.4 01/12/2016    Assessment & Plan    1.  ***   Murray Hodgkins, NP 02/11/2021, 1:17 PM

## 2021-03-14 ENCOUNTER — Encounter: Payer: Self-pay | Admitting: Orthopedic Surgery

## 2021-03-14 ENCOUNTER — Other Ambulatory Visit: Payer: Self-pay | Admitting: Orthopedic Surgery

## 2021-03-14 NOTE — H&P (Unsigned)
NAME: [name] MRN:   [id number] DOB:   11/04/1932     HISTORY AND PHYSICAL  CHIEF COMPLAINT:  left painful elbow  HISTORY:   [name] a 87 y.o. female  with left  Elbow Pain Patient complains of left elbow pain. Onset of the symptoms was several months ago. Inciting event: injury fall . Current symptoms include: redness and swelling. Pain is aggravated by: lifting heavy objects, supination/pronation as when opening doors. Symptoms have gradually worsened. Patient has had prior elbow problems. Evaluation to date: plain films, which were hardware in place . Treatment to date: {treatment:14351}.   .  Plan for left elbow hardware removal.  PAST MEDICAL HISTORY:   Past Medical History:  Diagnosis Date   (HFpEF) heart failure with preserved ejection fraction ([place])    a. 12/2015 Echo: EF 60-65%. no rwma, Gr2 DD, triv AI, mild MR, mildly dil LA. Mild-mod TR. PASP 56mmHg; b. 02/2018 Echo: EF 55-60%, no rwma, mild MR, mildly dil LA, nl PASP.   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 08/2010: EF 73%, no ischemia/infarct.   Hyperlipidemia    Hypertension    Hypothyroid    PAD (peripheral artery disease) ([place])    a. 08/2015 s/p PTA of R AT w/ drug-coated balloon angioplasty to R Popliteal; b. 05/2017 ABI: R 1.16, L 0.88-->stable.   PAF (paroxysmal atrial fibrillation) ([place])    a. 08/2010 s/p  DCCV;  b. CHA2DS2VASc = 6-->No OAC 2/2 h/o falls.   PONV (postoperative nausea and vomiting)    nausea vomiting long ago with surgery but not recent ones    PAST SURGICAL HISTORY:   Past Surgical History:  Procedure Laterality Date   ABDOMINAL EXPLORATION SURGERY     "had to go back in after hysterectomy & check on stitch dr had put near my bladder; don't know if they took it out; had to wear catheter for 1 month"   [place], ARTERY Right 09/08/2015   superficial femoral   BREAST EXCISIONAL BIOPSY Right 2007   positive   BREAST SURGERY     CARDIOVERSION  11/10/2011   Procedure: CARDIOVERSION;  Surgeon: Lelon Perla, MD;  Location: [facility] OR;  Service: Cardiovascular;  Laterality: N/A;   CARDIOVERSION  08/26/2010   CATARACT EXTRACTION W/PHACO Right 07/17/2017   Procedure: CATARACT EXTRACTION PHACO AND INTRAOCULAR LENS PLACEMENT ([place]);  Surgeon: Birder Robson, MD;  Location: [facility] ORS;  Service: Ophthalmology;  Laterality: Right;  [place] 00:44.0 AP% 14.6 CDE 6.44 FLUID PACK LOT # [id number]   CATARACT EXTRACTION W/PHACO Left 08/07/2017   Procedure: CATARACT EXTRACTION PHACO AND INTRAOCULAR LENS PLACEMENT (I[facility]);  Surgeon:  Birder Robson, MD;  Location: [facility] ORS;  Service: Ophthalmology;  Laterality: Left;  [place] 00:44 AP% 14.1 CDE 6.32 Fluid pack lot # [id number]   COLONOSCOPY     COLONOSCOPY WITH PROPOFOL N/A 01/04/2016   Procedure: COLONOSCOPY WITH PROPOFOL;  Surgeon: Lollie Sails, MD;  Location: [facility] ENDOSCOPY;  Service: Endoscopy;  Laterality: N/A;   EYE SURGERY     HALLUX VALGUS CORRECTION     HIP CLOSED REDUCTION Left 01/19/2021   Procedure: CLOSED REDUCTION HIP;  Surgeon: Lovell Sheehan, MD;  Location: [facility] ORS;  Service: Orthopedics;  Laterality: Left;   JOINT REPLACEMENT     KYPHOPLASTY N/A 06/25/2018   Procedure: KYPHOPLASTY L3;  Surgeon: Hessie Knows, MD;  Location: [facility] ORS;  Service: Orthopedics;   Laterality: N/A;   LUMBAR DISC SURGERY     MASTECTOMY Right 2013   positive   ORIF ELBOW FRACTURE Left 01/19/2021   Procedure: OPEN REDUCTION INTERNAL FIXATION (ORIF) ELBOW/OLECRANON FRACTURE;  Surgeon: Lovell Sheehan, MD;  Location: [facility] ORS;  Service: Orthopedics;  Laterality: Left;   PERCUTANEOUS PINNING Left 01/19/2021   Procedure: PERCUTANEOUS PINNING EXTREMITY;  Surgeon: Lovell Sheehan, MD;  Location: [facility] ORS;  Service: Orthopedics;  Laterality: Left;   PERIPHERAL VASCULAR CATHETERIZATION N/A 09/08/2015   Procedure: Abdominal Aortogram w/Lower Extremity;  Surgeon: Wellington Hampshire, MD;  Location: [place] [facility];  Service: Cardiovascular;  Laterality: N/A;   PERIPHERAL VASCULAR CATHETERIZATION  09/08/2015   Procedure: Peripheral Vascular Balloon Angioplasty;  Surgeon: Wellington Hampshire, MD;  Location: [place] [facility];  Service: Cardiovascular;;   REVISION TOTAL KNEE ARTHROPLASTY Right    rt.ankle ulcer surgery  2013   TEMPOROMANDIBULAR JOINT ARTHROPLASTY     TMJ ARTHROPLASTY     TONSILLECTOMY     TOTAL KNEE ARTHROPLASTY Right    TOTAL KNEE ARTHROPLASTY Left 02/26/2018   Procedure: TOTAL KNEE ARTHROPLASTY;  Surgeon: Corky Mull, MD;  Location: [facility] ORS;  Service: Orthopedics;  Laterality: Left;   VASCULAR SURGERY      MEDICATIONS:  (Not in a hospital admission)   ALLERGIES:   Allergies  Allergen Reactions   Other Other (See Comments)    NO BP, VENIPUNCTURE, OR ACCESS IN RIGHT ARM - S/P MASTECTOMY ON RIGHT   Amlodipine Nausea And Vomiting   Ciprofloxacin Nausea Only   Doxycycline Itching and Other (See Comments)    shaky   Lipitor [Atorvastatin] Other (See Comments)    Joint pain    Morphine And Related Nausea And Vomiting   Macrobid [Nitrofurantoin Macrocrystal] Rash   Penicillins Rash and Other (See Comments)    Has patient had a PCN reaction causing immediate rash, facial/tongue/throat swelling, SOB or lightheadedness with hypotension: yes Has patient had a PCN  reaction causing severe rash involving mucus membranes or skin necrosis: no Has patient had a PCN reaction that required hospitalization no Has patient had a PCN reaction occurring within the last 10 years: no If all of the above answers are "NO", then may proceed with Cephalosporin use.    Sulfa Antibiotics Rash    REVIEW OF SYSTEMS:   Negative except HPI  FAMILY HISTORY:   Family History  Problem Relation Age of Onset   Heart attack Father    Coronary artery disease Sister 49       MI   Breast cancer Maternal Aunt     SOCIAL HISTORY:   reports that she has never smoked. She has never used smokeless tobacco. She reports that she does not  drink alcohol and does not use drugs.  PHYSICAL EXAM:  General appearance: alert, cooperative, and no distress Neck: no JVD and supple, symmetrical, trachea midline Resp: clear to auscultation bilaterally Cardio: regular rate and rhythm, S1, S2 normal, no murmur, click, rub or gallop Extremities: extremities normal, atraumatic, no cyanosis or edema Pulses: 2+ and symmetric Skin: Skin color, texture, turgor normal. No rashes or lesions Neurologic: Alert and oriented X 3, normal strength and tone. Normal symmetric reflexes. Normal coordination and gait    LABORATORY STUDIES: No results for input(s): WBC, HGB, HCT, PLT in the last 72 hours.  No results for input(s): NA, K, CL, CO2, GLUCOSE, BUN, CREATININE, CALCIUM in the last 72 hours.  STUDIES/RESULTS:  No results found.  ASSESSMENT: left elbow painful hardware        Active Problems:   * No active hospital problems. *    PLAN:  left elbow hardware removal   Bernita Beckstrom 03/14/2021. 2:56 PM

## 2021-03-16 ENCOUNTER — Encounter
Admission: RE | Admit: 2021-03-16 | Discharge: 2021-03-16 | Disposition: A | Discharge status: Home / Self Care | Payer: Medicare HMO | Source: Ambulatory Visit | Attending: Orthopedic Surgery | Admitting: Orthopedic Surgery

## 2021-03-16 ENCOUNTER — Encounter: Payer: Self-pay | Admitting: Orthopedic Surgery

## 2021-03-16 ENCOUNTER — Other Ambulatory Visit: Payer: Self-pay

## 2021-03-16 HISTORY — DX: Unspecified thoracic, thoracolumbar and lumbosacral intervertebral disc disorder: M51.9

## 2021-03-16 HISTORY — DX: Diverticulosis of intestine, part unspecified, without perforation or abscess without bleeding: K57.90

## 2021-03-16 NOTE — Progress Notes (Signed)
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   Received cephalosporin with no documented complications CEFAZOLIN received on 08/20/2013 and 01/19/2021 without documented ADRs.   Screened as appropriate for cephalosporin use during medication reconciliation No immediate angioedema, dysphagia, SOB, anaphylaxis symptoms. No severe rash involving mucous membranes or skin necrosis. No hospital admissions related to side effects of PCN/cephalosporin use.  No documented reaction to PCN or cephalosporin in the last 10 years.  Request:  As an evidence based approach to reducing the rate of incidence for post-operative SSI and the development of MDROs, could an agent with narrower coverage for preoperative prophylaxis in this patient's upcoming surgical course be considered?   Currently ordered preoperative prophylactic ABX: clindamycin.   Specifically requesting change to cephalosporin (CEFAZOLIN).   Please communicate decision with me and I will change the orders in Epic as per your direction.   Things to consider: Many patients report that they were "allergic" to PCN earlier in life, however this does not translate into a true lifelong allergy. Patients can lose sensitivity to specific IgE antibodies over time if  PCN is avoided (Kleris & Lugar, 2019).  Up to 10% of the adult population and 15% of hospitalized patients report an allergy to PCN, however clinical studies suggest that 90% of those reporting an allergy can tolerate PCN antibiotics (Kleris & Lugar, 2019).  Cross-sensitivity between PCN and cephalosporins has been documented as being as high as 10%, however this estimation included data believed to have been collected in a setting where there was contamination. Newer data suggests that the prevalence of cross-sensitivity between PCN and cephalosporins is actually estimated to be closer to 1% (Hermanides et al., 2018).   Patients labeled as PCN allergic, whether they are truly allergic or not, have been found to have inferior outcomes in terms of rates of serious infection, and these patients tend to have longer hospital stays ([place], 2019).  Treatment related secondary infections, such as Clostridioides difficile, have been linked to the improper use of broad spectrum antibiotics in patients improperly labeled as PCN allergic (Kleris & Lugar, 2019).  Anaphylaxis from cephalosporins is rare and the evidence suggests that there is no increased risk of an anaphylactic type reaction when cephalosporins are used in a PCN allergic patient (Pichichero, 2006).  Citations: Hermanides J, Lemkes BA, Prins Pearla Dubonnet MW, Terreehorst I. Presumed ?-Lactam Allergy and Cross-reactivity in the Operating Theater: A Practical Approach. Anesthesiology. 2018 Aug;129(2):335-342. doi: 10.1097/ALN.0000000000002252. PMID: 35329924.  Kleris, Antelope., & Lugar, P. L. (2019). Things We Do For No Reason: Failing to Question a Penicillin Allergy History. Journal of hospital medicine, 14(10), 6811668527. Advance online publication. https://www.wallace-[place]leton.info/  Pichichero, M. E. (2006). Cephalosporins can be prescribed safely for penicillin-allergic patients. Journal of family medicine, 55(2), 106-112. Accessed:  https://cdn.mdedge.com/files/s86fs-public/Document/September-2017/5502JFP_AppliedEvidence1.pdf   Honor Loh, MSN,  APRN, FNP-C, [place]  Peri-operative Services Nurse Practitioner FAX: [phone] 03/16/21 3:01 PM

## 2021-03-16 NOTE — Patient Instructions (Addendum)
Your procedure is scheduled on: Friday, December 2 Report to the Registration Desk on the 1st floor of the [facility]. To find out your arrival time, please call [phone] between 1PM - 3PM on: Thursday, December 1  REMEMBER: Instructions that are not followed completely may result in serious medical risk, up to and including death; or upon the discretion of your surgeon and anesthesiologist your surgery may need to be rescheduled.  Do not eat food after midnight the night before surgery.  No gum chewing, lozengers or hard candies.  You may however, drink CLEAR liquids up to 2 hours before you are scheduled to arrive for your surgery. Do not drink anything within 2 hours of your scheduled arrival time.  Clear liquids include: - water  - apple juice without pulp - gatorade (not RED, PURPLE, OR BLUE) - black coffee or tea (Do NOT add milk or creamers to the coffee or tea) Do NOT drink anything that is not on this list.  TAKE THESE MEDICATIONS THE MORNING OF SURGERY WITH A SIP OF WATER:  Amiodarone budesonide (entocort) Famotidine (Pepcid) Hydrocodone if needed for pain Levothyroxine Metoprolol Paroxetine (Paxil)  One week prior to surgery: Stop aspirin and Anti-inflammatories (NSAIDS) such as Advil, Aleve, Ibuprofen, Motrin, Naproxen, Naprosyn and Aspirin based products such as Excedrin, Goodys Powder, BC Powder. Stop ANY OVER THE COUNTER supplements until after surgery. Stop calcium, vitamin D, multiple vitamin You may however, continue to take Tylenol if needed for pain up until the day of surgery.  No Alcohol for 24 hours before or after surgery.  No Smoking including e-cigarettes for 24 hours prior to surgery.   On the morning of surgery brush your teeth with toothpaste and water, you may rinse your mouth with mouthwash if you wish. Do not swallow any toothpaste or mouthwash.  Use CHG Soap as directed on instruction sheet.  Do not wear jewelry, make-up, hairpins,  clips or nail polish.  Do not wear lotions, powders, or perfumes.   Do not shave body from the neck down 48 hours prior to surgery just in case you cut yourself which could leave a site for infection.  Also, freshly shaved skin may become irritated if using the CHG soap.  Contact lenses, hearing aids and dentures may not be worn into surgery.  Do not bring valuables to the hospital. [facility] is not responsible for any missing/lost belongings or valuables.   Notify your doctor if there is any change in your medical condition (cold, fever, infection).  Wear comfortable clothing (specific to your surgery type) to the hospital.  After surgery, you can help prevent lung complications by doing breathing exercises.  Take deep breaths and cough every 1-2 hours. Your doctor may order a device called an Incentive Spirometer to help you take deep breaths.  If you are being discharged the day of surgery, you will not be allowed to drive home. You will need a responsible adult (18 years or older) to drive you home and stay with you that night.   If you are taking public transportation, you will need to have a responsible adult (18 years or older) with you. Please confirm with your physician that it is acceptable to use public transportation.   Please call the [facility] Dept. at [phone] if you have any questions about these instructions.  Surgery Visitation Policy:  Patients undergoing a surgery or procedure may have one family member or support person with them as long as that person is  not COVID-19 positive or experiencing its symptoms.  That person may remain in the waiting area during the procedure and may rotate out with other people.

## 2021-03-17 ENCOUNTER — Encounter: Payer: Self-pay | Admitting: Orthopedic Surgery

## 2021-03-17 NOTE — Progress Notes (Signed)
Perioperative Services  Pre-Admission/Anesthesia Testing Clinical Review  Date: 03/17/21  Patient Demographics:  Name: [name] DOB:   [birth year] MRN:   [id number]  Planned Surgical Procedure(s):    Case: [id number] Date/Time: 03/18/21 0715   Procedure: HARDWARE REMOVAL (Left)   Anesthesia type: General   Pre-op diagnosis: T84.50XA Infect/inflm reaction due to unsp int joint prosth, init   Location: [facility] OR ROOM 09 / [place] [facility]   Surgeons: Lovell Sheehan, MD   NOTE: Available PAT nursing documentation and vital signs have been reviewed. Clinical nursing staff has updated patient's PMH/PSHx, current medication list, and drug allergies/intolerances to ensure comprehensive history available to assist in medical decision making as it pertains to the aforementioned surgical procedure and anticipated anesthetic course. Extensive review of available clinical information performed. [place] PMH and PSHx updated with any diagnoses/procedures that  may have been inadvertently omitted during her intake with the pre-admission testing department's nursing staff.  Clinical Discussion:  [name] is a 87 y.o. female who is submitted for pre-surgical anesthesia review and clearance prior to her undergoing the above procedure. Patient has never been a smoker. Pertinent PMH includes: atrial fibrillation, HFpEF, PAD, RBBB, DVT, HTN, HLD, hypothyroidism, COPD, CKD-IV, GERD (on daily H2 blocker), anemia, peripheral edema, chronic lower back pain, OA, recurrent RIGHT breast cancer (s/p surgical excision + XRT).  Patient is followed by cardiology Saralyn Pilar, MD). She was last seen in the cardiology clinic on 03/04/2021; notes reviewed.  At the time of her clinic visit, patient doing well overall from a cardiovascular perspective.  She denied any episodes of chest pain, however she continued to experience chronic exertional dyspnea.  Patient had not experienced any PND, orthopnea,  significant peripheral edema, vertiginous symptoms, or presyncope/syncope.  PMH significant for cardiovascular diagnoses.  Patient with a history of paroxysmal atrial fibrillation; CHA2DS2-VASc Score = 8 (age x 2, sex, HFpEF, HTN, DVT x 2, PVD).  Rate and rhythm maintained on oral amiodarone + metoprolol succinate.  Patient chronically anticoagulated with dose reduced apixaban + daily low-dose ASA; compliant with therapy with no evidence or reports of GI bleeding.  Patient underwent DCCV procedure on 08/26/2010, which restored NSR.  She underwent a second DCCV procedure on 11/10/2011 for refractory atrial fibrillation.  TTE performed on 03/01/2018 revealed normal left ventricular systolic function with mild concentric LVH.  LVEF was normal at 55-60%.  There was mild mitral valve regurgitation and left atrial dilatation.  There was no significant transvalvular gradient to suggest stenosis.  Long-term cardiac event monitor study performed on 12/29/2020 revealed a predominant underlying atrial fibrillation/atrial flutter with a mean rate of 96 bpm (range 67-154 bpm).  Repeat TTE performed on 10/26/2020 revealed normal left ventricular systolic function with an EF >55%.  There was trivial to mild pan valvular regurgitation with no evidence of valvular stenosis.  There was moderate left atrial and mild right atrial enlargement.    Blood pressure elevated at 156/74 on currently prescribed CCB, beta-blocker, and diuretic therapies.  Patient not currently taking any type of lipid-lowering therapies for her HLD diagnosis or ASCVD prevention.  Patient is not diabetic.  ECG performed in the office revealed atrial fibrillation at a rate of 102 bpm with an associated RBBB. Functional capacity, as defined by DASI, is documented as being >/= 4 METS.  No changes were made to her medication regimen.  Patient to follow-up with outpatient cardiology in 2 months or sooner if needed.  Pricilla Holm Bailey underwent an ORIF of  her  LEFT elbow back in 01/2021.  She presented back to her orthopedist with complaints of pain and decision was made to remove the previously placed orthopedic hardware in efforts to improve pain level.  Patient is scheduled for an HARDWARE REMOVAL on 03/18/2021 with Dr. Kurtis Bushman, MD. Given patient's past medical history significant for cardiovascular diagnoses, presurgical cardiac clearance was sought by the PAT team. Per cardiology, "this patient is optimized for surgery and may proceed with the planned procedural course with a ACCEPTABLE risk of significant perioperative cardiovascular complications".  Again, this patient is on daily anticoagulation therapy. She has been instructed on recommendations for holding her apixaban and daily low-dose ASA prior to her procedure with plans to restart since postoperatively respectively minimized by her primary attending surgeon.  Patient reports previous perioperative complications with anesthesia in the past.  Patient has a history of (+) PONV in the past, however notes that it was a "long time ago", and advises that she has not had issues with more recent surgeries.  Additionally, patient reporting (+) prolonged effects of anesthesia citing that it "took her about a week to know what was going on after an orthopedic surgery.  In review of the available records, it is noted that patient underwent a general anesthetic course here (ASA III) in 01/2021 without documented complications.   Vitals with BMI 03/16/2021 01/29/2021 01/29/2021  Height _0  - -  Weight 124 lbs - -  BMI 89.38 - -  Systolic - 101 751  Diastolic - 025 91  Pulse - 102 110    Providers/Specialists:   NOTE: Primary physician provider listed below. Patient may have been seen by APP or partner within same practice.   PROVIDER ROLE / SPECIALTY LAST Jayme Cloud, MD Orthopedics 03/14/2021  Rusty Aus, MD Primary Care Provider 03/07/2021  Isaias Cowman, MD Cardiology  03/04/2021   Allergies:  Other, Amlodipine, Ciprofloxacin, Doxycycline, Lipitor [atorvastatin], Morphine and related, Macrobid [nitrofurantoin macrocrystal], Penicillins, and Sulfa antibiotics  Current Home Medications:   No current facility-administered medications for this encounter.    acetaminophen (TYLENOL) 325 MG tablet   amiodarone (PACERONE) 200 MG tablet   aspirin EC 81 MG tablet   budesonide (ENTOCORT EC) 3 MG 24 hr capsule   calcium-vitamin D (OSCAL WITH D) 500-200 MG-UNIT per tablet   Cholecalciferol (VITAMIN D3) 5000 units CAPS   docusate sodium (COLACE) 100 MG capsule   famotidine (PEPCID) 40 MG tablet   HYDROcodone-acetaminophen (NORCO/VICODIN) 5-325 MG tablet   lactose free nutrition (BOOST) LIQD   levothyroxine (SYNTHROID) 112 MCG tablet   metoprolol succinate (TOPROL-XL) 50 MG 24 hr tablet   Multiple Vitamins-Minerals (CENTROVITE) TABS   PARoxetine (PAXIL) 20 MG tablet   spironolactone (ALDACTONE) 25 MG tablet   History:   Past Medical History:  Diagnosis Date   (HFpEF) heart failure with preserved ejection fraction ([place])    a.) TTE 12/2015: EF 60-65%. no rwma, G2DD, triv AI, mild MR, mildly dil LA. Mild-mod TR. PASP 53mHg; b.) TTE 03/01/2018: EF 55-60%, no rwma, mild MR, mildly dil LA, nl PASP. c.) TTE 10/26/2020: EF >55%; mild RA and mod LA enlargement. Triv-mild panvalvular regurgitation.   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   Edema    FEET/ANKLES   GERD (gastroesophageal reflux disease)    History of sepsis    History of stress test    a. 08/2010: EF 73%, no ischemia/infarct.   Hyperlipidemia    Hypertension    Hypothyroid    Lumbar disc disease    Mixed Alzheimer's and vascular dementia ([place])    PAD (peripheral artery disease) ([place])    a. 08/2015 s/p PTA of R AT w/ drug-coated balloon angioplasty to R Popliteal; b. 05/2017 ABI: R 1.16, L 0.88-->stable.   PAF (paroxysmal atrial fibrillation) (HCC)    a.) CHA2DS2-VASc = 8 (age x 2, sex, HFpEF, HTN, DVT x2, PVD). b.) rate/rhythm maintained on amiodarone + metoprolol succinate; chronically anticoagulated with low dose apixaban + ASA. c.) s/p DCCV 08/26/2010 and 11/10/2011   PONV (postoperative nausea and vomiting)    nausea vomiting long ago with surgery but not recent ones   RBBB (right bundle branch block)    Past Surgical History:  Procedure Laterality Date   ABDOMINAL EXPLORATION SURGERY     "had to go back in after hysterectomy & check on stitch dr had put near my bladder; don't know if they took it out; had to wear catheter for 1 month"   ABDOMINAL HYSTERECTOMY     with BSO   APPENDECTOMY     BACK SURGERY     BALLOON ANGIOPLASTY, ARTERY Right 09/08/2015   superficial femoral   BREAST EXCISIONAL BIOPSY Right 2007   positive   BREAST SURGERY     CARDIOVERSION  11/10/2011   Procedure: CARDIOVERSION;  Surgeon: Lelon Perla, MD;  Location: [facility] OR;  Service: Cardiovascular;  Laterality: N/A;   CARDIOVERSION  08/26/2010   CATARACT EXTRACTION W/PHACO Right 07/17/2017   Procedure: CATARACT EXTRACTION PHACO AND INTRAOCULAR LENS PLACEMENT ([place]);  Surgeon: Birder Robson, MD;  Location: [facility] ORS;  Service: Ophthalmology;  Laterality: Right;  [place] 00:44.0 AP% 14.6 CDE 6.44 FLUID PACK LOT # [id number]   CATARACT EXTRACTION  W/PHACO Left 08/07/2017   Procedure: CATARACT EXTRACTION PHACO AND INTRAOCULAR LENS PLACEMENT (I[facility]);  Surgeon: Birder Robson, MD;  Location: [facility] ORS;  Service: Ophthalmology;  Laterality: Left;  [place] 00:44 AP% 14.1 CDE 6.32 Fluid pack lot # [id number]   COLONOSCOPY WITH PROPOFOL N/A 01/04/2016   Procedure: COLONOSCOPY WITH PROPOFOL;  Surgeon: Lollie Sails, MD;  Location: [facility] ENDOSCOPY;  Service: Endoscopy;  Laterality: N/A;   HALLUX VALGUS CORRECTION     HIP CLOSED REDUCTION Left 01/19/2021   Procedure: CLOSED REDUCTION HIP;  Surgeon: Lovell Sheehan, MD;  Location: [facility] ORS;  Service: Orthopedics;  Laterality: Left;   KYPHOPLASTY N/A 06/25/2018   Procedure: KYPHOPLASTY L3;  Surgeon: Hessie Knows, MD;  Location: [facility] ORS;  Service: Orthopedics;  Laterality: N/A;   LUMBAR DISC SURGERY     MASTECTOMY Right 2013   positive   ORIF ELBOW FRACTURE Left 01/19/2021   Procedure: OPEN REDUCTION INTERNAL FIXATION (ORIF)  ELBOW/OLECRANON FRACTURE;  Surgeon: Lovell Sheehan, MD;  Location: [facility] ORS;  Service: Orthopedics;  Laterality: Left;   PERCUTANEOUS PINNING Left 01/19/2021   Procedure: PERCUTANEOUS PINNING EXTREMITY;  Surgeon: Lovell Sheehan, MD;  Location: [facility] ORS;  Service: Orthopedics;  Laterality: Left;   PERIPHERAL VASCULAR CATHETERIZATION N/A 09/08/2015   Procedure: Abdominal Aortogram w/Lower Extremity;  Surgeon: Wellington Hampshire, MD;  Location: [place] [facility] LAB;  Service: Cardiovascular;  Laterality: N/A;   PERIPHERAL VASCULAR CATHETERIZATION  09/08/2015   Procedure: Peripheral Vascular Balloon Angioplasty;  Surgeon: Wellington Hampshire, MD;  Location: [place] [facility] LAB;  Service: Cardiovascular;;   REVISION TOTAL KNEE ARTHROPLASTY Right    rt.ankle ulcer surgery  2013   TEMPOROMANDIBULAR JOINT ARTHROPLASTY     TMJ ARTHROPLASTY     TONSILLECTOMY     TOTAL KNEE ARTHROPLASTY Right    TOTAL KNEE ARTHROPLASTY Left 02/26/2018   Procedure: TOTAL KNEE ARTHROPLASTY;  Surgeon: Corky Mull, MD;  Location: [facility] ORS;  Service: Orthopedics;  Laterality: Left;   Family History  Problem Relation Age of Onset   Heart attack Father    Coronary artery disease Sister 23       MI   Breast cancer Maternal Aunt    Social History   Tobacco Use   Smoking status: Never   Smokeless tobacco: Never  Vaping Use   Vaping Use: Never used  Substance Use Topics   Alcohol use: No   Drug use: No    Pertinent Clinical Results:  LABS:    Ref Range & Units 03/07/2021  WBC (White Blood Cell Count) 4.1 - 10.2 10^3/uL 10.6 High    RBC (Red Blood Cell Count) 4.04 - 5.48 10^6/uL 4.15   Hemoglobin 12.0 - 15.0 gm/dL 13.8   Hematocrit 35.0 - 47.0 % 42.7   MCV (Mean Corpuscular Volume) 80.0 - 100.0 fl 102.9 High    MCH (Mean Corpuscular Hemoglobin) 27.0 - 31.2 pg 33.3 High    MCHC (Mean Corpuscular Hemoglobin Concentration) 32.0 - 36.0 gm/dL 32.3   Platelet Count 150 - 450 10^3/uL 284   RDW-CV (Red Cell Distribution Width) 11.6 - 14.8 % 14.1   MPV (Mean Platelet Volume) 9.4 - 12.4 fl 9.6   Neutrophils 1.50 - 7.80 10^3/uL 7.97 High    Lymphocytes 1.00 - 3.60 10^3/uL 1.46   Monocytes 0.00 - 1.50 10^3/uL 0.72   Eosinophils 0.00 - 0.55 10^3/uL 0.25   Basophils 0.00 - 0.09 10^3/uL 0.06   Neutrophil % 32.0 - 70.0 % 75.1 High    Lymphocyte % 10.0 - 50.0 % 13.8   Monocyte % 4.0 - 13.0 % 6.8   Eosinophil % 1.0 - 5.0 % 2.4   Basophil% 0.0 - 2.0 % 0.6   Immature Granulocyte % <=0.7 % 1.3 High    Immature Granulocyte Count <=0.06 10^3/L 0.14 High    Resulting Agency  [facility] - LAB  Specimen Collected: 03/07/21 15:16 Last Resulted: 03/07/21 15:41  Received From: [facility]  Result Received: 03/14/21 14:52    Ref Range & Units 03/07/2021  Glucose 70 - 110 mg/dL 84   Sodium 136 - 145 mmol/L 140   Potassium 3.6 - 5.1 mmol/L 4.6   Chloride 97 - 109 mmol/L 104   Carbon Dioxide (CO2) 22.0 - 32.0 mmol/L 29.5   Urea Nitrogen (BUN) 7 - 25 mg/dL 23   Creatinine  0.6 - 1.1 mg/dL 1.3 High    Glomerular Filtration Rate (eGFR), MDRD Estimate >60  mL/min/1.73sq m 39 Low    Calcium 8.7 - 10.3 mg/dL 9.4   AST  8 - 39 U/L 25   ALT  5 - 38 U/L 22   Alk Phos (alkaline Phosphatase) 34 - 104 U/L 88   Albumin 3.5 - 4.8 g/dL 3.6   Bilirubin, Total 0.3 - 1.2 mg/dL 0.4   Protein, Total 6.1 - 7.9 g/dL 6.2   A/G Ratio 1.0 - 5.0 gm/dL 1.4   Resulting Agency  [facility] - LAB  Specimen Collected: 03/07/21 15:16 Last Resulted: 03/07/21 17:39  Received From: [facility]  Result Received: 03/14/21 14:52    ECG: Date: 01/29/2021 Time ECG obtained: 0351 AM Rate: 94 bpm Rhythm: atrial fibrillation; RBBB Axis (leads I and aVF): Normal Intervals: QRS 148 ms. QTc 558 ms. ST segment and T wave changes: No evidence of acute ST segment elevation or depression Comparison: Similar to previous tracing obtained on 01/17/2021   IMAGING / PROCEDURES: LONG TERM CARDIAC EVENT MONITOR STUDY performed on 12/29/2020 Predominant underlying atrial fibrillation/atrial flutter with a mean heart rate of 96 bpm Heart rate range was 67-154 bpm  TRANSTHORACIC ECHOCARDIOGRAM performed on 10/26/2020 Normal left ventricular systolic function with an EF of >55% Normal right ventricular systolic function Trivial AR and PR Mild MR and TR Moderate left atrial enlargement Mild right atrial enlargement Mild LVH No valvular stenosis No pericardial effusion  Impression and Plan:  Pricilla Holm [place] has been referred for pre-anesthesia review and clearance prior to her undergoing the planned anesthetic and procedural courses. Available labs, pertinent testing, and imaging results were personally reviewed by me. This patient has been appropriately cleared by cardiology with an overall ACCEPTABLE risk of significant perioperative cardiovascular complications.  Based on clinical review performed today (03/17/21), barring any significant acute changes in the patient's overall  condition, it is anticipated that she will be able to proceed with the planned surgical intervention. Any acute changes in clinical condition may necessitate her procedure being postponed and/or cancelled. Patient will meet with anesthesia team (MD and/or CRNA) on the day of her procedure for preoperative evaluation/assessment. Questions regarding anesthetic course will be fielded at that time.   Pre-surgical instructions were reviewed with the patient during her PAT appointment and questions were fielded by PAT clinical staff. Patient was advised that if any questions or concerns arise prior to her procedure then she should return a call to PAT and/or her surgeon's office to discuss.  Honor Loh, MSN, APRN, FNP-C, CEN [facility]  Peri-operative Services Nurse Practitioner Phone: [phone] Fax: [phone] 03/17/21 8:16 AM  NOTE: This note has been prepared using [employer]. Despite my best ability to proofread, there is always the potential that unintentional transcriptional errors may still occur from this process.

## 2021-03-18 ENCOUNTER — Encounter
Admission: RE | Disposition: A | Discharge status: Home / Self Care | Payer: Self-pay | Source: Home / Self Care | Attending: Orthopedic Surgery

## 2021-03-18 ENCOUNTER — Ambulatory Visit
Admission: RE | Admit: 2021-03-18 | Discharge: 2021-03-18 | Disposition: A | Discharge status: Home / Self Care | Payer: Medicare HMO | Attending: Orthopedic Surgery | Admitting: Orthopedic Surgery

## 2021-03-18 ENCOUNTER — Ambulatory Visit: Payer: Medicare HMO

## 2021-03-18 ENCOUNTER — Other Ambulatory Visit: Payer: Self-pay

## 2021-03-18 ENCOUNTER — Ambulatory Visit: Payer: Medicare HMO | Admitting: Urgent Care

## 2021-03-18 ENCOUNTER — Encounter: Payer: Self-pay | Admitting: Orthopedic Surgery

## 2021-03-18 DIAGNOSIS — Y798 Miscellaneous orthopedic devices associated with adverse incidents, not elsewhere classified: Secondary | ICD-10-CM | POA: Insufficient documentation

## 2021-03-18 DIAGNOSIS — I739 Peripheral vascular disease, unspecified: Secondary | ICD-10-CM | POA: Insufficient documentation

## 2021-03-18 DIAGNOSIS — E785 Hyperlipidemia, unspecified: Secondary | ICD-10-CM | POA: Insufficient documentation

## 2021-03-18 DIAGNOSIS — I480 Paroxysmal atrial fibrillation: Secondary | ICD-10-CM | POA: Insufficient documentation

## 2021-03-18 DIAGNOSIS — Z880 Allergy status to penicillin: Secondary | ICD-10-CM | POA: Diagnosis not present

## 2021-03-18 DIAGNOSIS — Z86718 Personal history of other venous thrombosis and embolism: Secondary | ICD-10-CM | POA: Diagnosis not present

## 2021-03-18 DIAGNOSIS — E039 Hypothyroidism, unspecified: Secondary | ICD-10-CM | POA: Insufficient documentation

## 2021-03-18 DIAGNOSIS — M1990 Unspecified osteoarthritis, unspecified site: Secondary | ICD-10-CM | POA: Diagnosis not present

## 2021-03-18 DIAGNOSIS — J449 Chronic obstructive pulmonary disease, unspecified: Secondary | ICD-10-CM | POA: Insufficient documentation

## 2021-03-18 DIAGNOSIS — I130 Hypertensive heart and chronic kidney disease with heart failure and stage 1 through stage 4 chronic kidney disease, or unspecified chronic kidney disease: Secondary | ICD-10-CM | POA: Insufficient documentation

## 2021-03-18 DIAGNOSIS — K219 Gastro-esophageal reflux disease without esophagitis: Secondary | ICD-10-CM | POA: Insufficient documentation

## 2021-03-18 DIAGNOSIS — F32A Depression, unspecified: Secondary | ICD-10-CM | POA: Diagnosis not present

## 2021-03-18 DIAGNOSIS — F0153 Vascular dementia, unspecified severity, with mood disturbance: Secondary | ICD-10-CM | POA: Diagnosis not present

## 2021-03-18 DIAGNOSIS — I5032 Chronic diastolic (congestive) heart failure: Secondary | ICD-10-CM | POA: Insufficient documentation

## 2021-03-18 DIAGNOSIS — N184 Chronic kidney disease, stage 4 (severe): Secondary | ICD-10-CM | POA: Diagnosis not present

## 2021-03-18 DIAGNOSIS — F0283 Dementia in other diseases classified elsewhere, unspecified severity, with mood disturbance: Secondary | ICD-10-CM | POA: Insufficient documentation

## 2021-03-18 DIAGNOSIS — D631 Anemia in chronic kidney disease: Secondary | ICD-10-CM | POA: Insufficient documentation

## 2021-03-18 DIAGNOSIS — G309 Alzheimer's disease, unspecified: Secondary | ICD-10-CM | POA: Insufficient documentation

## 2021-03-18 DIAGNOSIS — Z853 Personal history of malignant neoplasm of breast: Secondary | ICD-10-CM | POA: Diagnosis not present

## 2021-03-18 DIAGNOSIS — T8459XA Infection and inflammatory reaction due to other internal joint prosthesis, initial encounter: Secondary | ICD-10-CM | POA: Insufficient documentation

## 2021-03-18 HISTORY — DX: Collagenous colitis: K52.831

## 2021-03-18 HISTORY — DX: Vascular dementia, unspecified severity, without behavioral disturbance, psychotic disturbance, mood disturbance, and anxiety: F02.80

## 2021-03-18 HISTORY — PX: HARDWARE REMOVAL: SHX979

## 2021-03-18 HISTORY — DX: Long term (current) use of anticoagulants: Z79.01

## 2021-03-18 HISTORY — DX: Unspecified right bundle-branch block: I45.10

## 2021-03-18 HISTORY — DX: Vascular dementia, unspecified severity, without behavioral disturbance, psychotic disturbance, mood disturbance, and anxiety: F01.50

## 2021-03-18 SURGERY — REMOVAL, HARDWARE
Anesthesia: General | Site: Elbow | Laterality: Left

## 2021-03-18 MED ORDER — DEXAMETHASONE SODIUM PHOSPHATE 10 MG/ML IJ SOLN
INTRAMUSCULAR | Status: DC | PRN
  Administered 2021-03-18: 5 mg via INTRAVENOUS

## 2021-03-18 MED ORDER — KETOROLAC TROMETHAMINE 15 MG/ML IJ SOLN: 7.5000 mg | Freq: Four times a day (QID) | INTRAMUSCULAR | Status: DC

## 2021-03-18 MED ORDER — BUPIVACAINE-EPINEPHRINE (PF) 0.25% -1:200000 IJ SOLN
INTRAMUSCULAR | Status: DC | PRN
  Administered 2021-03-18: 10 mL

## 2021-03-18 MED ORDER — ACETAMINOPHEN 325 MG PO TABS: 325.0000 mg | ORAL_TABLET | Freq: Four times a day (QID) | ORAL | Status: DC | PRN

## 2021-03-18 MED ORDER — PROPOFOL 10 MG/ML IV BOLUS
INTRAVENOUS | Status: AC
  Filled 2021-03-18: qty 20

## 2021-03-18 MED ORDER — NEOMYCIN-POLYMYXIN B GU 40-200000 IR SOLN
Status: AC
  Filled 2021-03-18: qty 20

## 2021-03-18 MED ORDER — CLINDAMYCIN PHOSPHATE 900 MG/50ML IV SOLN
INTRAVENOUS | Status: AC
  Filled 2021-03-18: qty 50

## 2021-03-18 MED ORDER — METOCLOPRAMIDE HCL 5 MG/ML IJ SOLN: 5.0000 mg | Freq: Three times a day (TID) | INTRAMUSCULAR | Status: DC | PRN

## 2021-03-18 MED ORDER — FENTANYL CITRATE (PF) 100 MCG/2ML IJ SOLN
INTRAMUSCULAR | Status: DC | PRN
  Administered 2021-03-18: 25 ug via INTRAVENOUS

## 2021-03-18 MED ORDER — BUPIVACAINE-EPINEPHRINE (PF) 0.25% -1:200000 IJ SOLN
INTRAMUSCULAR | Status: AC
  Filled 2021-03-18: qty 30

## 2021-03-18 MED ORDER — LACTATED RINGERS IV SOLN: INTRAVENOUS | Status: DC

## 2021-03-18 MED ORDER — CLINDAMYCIN HCL 300 MG PO CAPS: 300.0000 mg | ORAL_CAPSULE | Freq: Three times a day (TID) | ORAL | 0 refills | Status: AC

## 2021-03-18 MED ORDER — CHLORHEXIDINE GLUCONATE 0.12 % MT SOLN: 15.0000 mL | Freq: Once | OROMUCOSAL | Status: DC

## 2021-03-18 MED ORDER — NEOMYCIN-POLYMYXIN B GU 40-200000 IR SOLN
Status: DC | PRN
  Administered 2021-03-18: 2 mL

## 2021-03-18 MED ORDER — ONDANSETRON HCL 4 MG/2ML IJ SOLN: 4.0000 mg | Freq: Four times a day (QID) | INTRAMUSCULAR | Status: DC | PRN

## 2021-03-18 MED ORDER — LIDOCAINE HCL (CARDIAC) PF 100 MG/5ML IV SOSY
PREFILLED_SYRINGE | INTRAVENOUS | Status: DC | PRN
  Administered 2021-03-18: 60 mg via INTRAVENOUS

## 2021-03-18 MED ORDER — 0.9 % SODIUM CHLORIDE (POUR BTL) OPTIME
TOPICAL | Status: DC | PRN
  Administered 2021-03-18: 500 mL

## 2021-03-18 MED ORDER — PHENYLEPHRINE HCL (PRESSORS) 10 MG/ML IV SOLN
INTRAVENOUS | Status: DC | PRN
  Administered 2021-03-18 (×3): 100 ug via INTRAVENOUS
  Administered 2021-03-18: 50 ug via INTRAVENOUS
  Administered 2021-03-18: 100 ug via INTRAVENOUS

## 2021-03-18 MED ORDER — EPHEDRINE SULFATE 50 MG/ML IJ SOLN
INTRAMUSCULAR | Status: DC | PRN
  Administered 2021-03-18 (×2): 10 mg via INTRAVENOUS

## 2021-03-18 MED ORDER — FENTANYL CITRATE (PF) 100 MCG/2ML IJ SOLN
INTRAMUSCULAR | Status: AC
  Filled 2021-03-18: qty 2

## 2021-03-18 MED ORDER — CLINDAMYCIN PHOSPHATE 900 MG/50ML IV SOLN
900.0000 mg | INTRAVENOUS | Status: AC
  Administered 2021-03-18: 900 mg via INTRAVENOUS

## 2021-03-18 MED ORDER — PROPOFOL 10 MG/ML IV BOLUS
INTRAVENOUS | Status: DC | PRN
  Administered 2021-03-18: 60 mg via INTRAVENOUS

## 2021-03-18 MED ORDER — ACETAMINOPHEN 10 MG/ML IV SOLN: 1000.0000 mg | Freq: Once | INTRAVENOUS | Status: DC | PRN

## 2021-03-18 MED ORDER — FENTANYL CITRATE (PF) 100 MCG/2ML IJ SOLN: 25.0000 ug | INTRAMUSCULAR | Status: DC | PRN

## 2021-03-18 MED ORDER — METOCLOPRAMIDE HCL 10 MG PO TABS: 5.0000 mg | ORAL_TABLET | Freq: Three times a day (TID) | ORAL | Status: DC | PRN

## 2021-03-18 MED ORDER — ORAL CARE MOUTH RINSE: 15.0000 mL | Freq: Once | OROMUCOSAL | Status: DC

## 2021-03-18 MED ORDER — ONDANSETRON HCL 4 MG/2ML IJ SOLN: 4.0000 mg | Freq: Once | INTRAMUSCULAR | Status: DC | PRN

## 2021-03-18 MED ORDER — ONDANSETRON HCL 4 MG PO TABS: 4.0000 mg | ORAL_TABLET | Freq: Four times a day (QID) | ORAL | Status: DC | PRN

## 2021-03-18 MED ORDER — CHLORHEXIDINE GLUCONATE 0.12 % MT SOLN
OROMUCOSAL | Status: AC
  Filled 2021-03-18: qty 15

## 2021-03-18 MED ORDER — ONDANSETRON HCL 4 MG/2ML IJ SOLN
INTRAMUSCULAR | Status: DC | PRN
  Administered 2021-03-18: 4 mg via INTRAVENOUS

## 2021-03-18 SURGICAL SUPPLY — 43 items
APL PRP STRL LF DISP 70% ISPRP (MISCELLANEOUS) ×1
BNDG COHESIVE 4X5 TAN ST LF (GAUZE/BANDAGES/DRESSINGS) ×2 IMPLANT
BNDG ELASTIC 4X5.8 VLCR STR LF (GAUZE/BANDAGES/DRESSINGS) ×2 IMPLANT
BNDG ESMARK 4X12 TAN STRL LF (GAUZE/BANDAGES/DRESSINGS) ×1 IMPLANT
BRUSH SCRUB EZ  4% CHG (MISCELLANEOUS) ×2
BRUSH SCRUB EZ 4% CHG (MISCELLANEOUS) ×1 IMPLANT
CHLORAPREP W/TINT 26 (MISCELLANEOUS) ×2 IMPLANT
CUFF TOURN SGL QUICK 12 (TOURNIQUET CUFF) ×1 IMPLANT
CUFF TOURN SGL QUICK 18X4 (TOURNIQUET CUFF) IMPLANT
CUFF TOURN SGL QUICK 24 (TOURNIQUET CUFF)
CUFF TRNQT CYL 24X4X16.5-23 (TOURNIQUET CUFF) IMPLANT
DRAPE FLUOR MINI C-ARM 54X84 (DRAPES) ×2 IMPLANT
DRAPE INCISE IOBAN 66X45 STRL (DRAPES) ×2 IMPLANT
ELECT CAUTERY BLADE 6.4 (BLADE) ×2 IMPLANT
ELECT REM PT RETURN 9FT ADLT (ELECTROSURGICAL) ×2
ELECTRODE REM PT RTRN 9FT ADLT (ELECTROSURGICAL) ×1 IMPLANT
GAUZE 4X4 16PLY ~~LOC~~+RFID DBL (SPONGE) ×2 IMPLANT
GAUZE SPONGE 4X4 12PLY STRL (GAUZE/BANDAGES/DRESSINGS) ×2 IMPLANT
GAUZE XEROFORM 1X8 LF (GAUZE/BANDAGES/DRESSINGS) ×2 IMPLANT
GLOVE SURG ORTHO LTX SZ8 (GLOVE) ×4 IMPLANT
GLOVE SURG UNDER LTX SZ8 (GLOVE) ×2 IMPLANT
GOWN STRL REUS W/ TWL LRG LVL3 (GOWN DISPOSABLE) ×1 IMPLANT
GOWN STRL REUS W/ TWL XL LVL3 (GOWN DISPOSABLE) ×1 IMPLANT
GOWN STRL REUS W/TWL LRG LVL3 (GOWN DISPOSABLE) ×2
GOWN STRL REUS W/TWL XL LVL3 (GOWN DISPOSABLE) ×2
KIT TURNOVER KIT A (KITS) ×2 IMPLANT
MANIFOLD NEPTUNE II (INSTRUMENTS) ×2 IMPLANT
NDL FILTER BLUNT 18X1 1/2 (NEEDLE) ×1 IMPLANT
NEEDLE FILTER BLUNT 18X 1/2SAF (NEEDLE) ×1
NEEDLE FILTER BLUNT 18X1 1/2 (NEEDLE) ×1 IMPLANT
NS IRRIG 1000ML POUR BTL (IV SOLUTION) ×2 IMPLANT
PACK EXTREMITY ARMC (MISCELLANEOUS) ×2 IMPLANT
STAPLER SKIN PROX 35W (STAPLE) ×2 IMPLANT
STOCKINETTE M/LG 89821 (MISCELLANEOUS) ×2 IMPLANT
SUT ETHILON 3-0 FS-10 30 BLK (SUTURE) ×2
SUT PROLENE 4 0 PS 2 18 (SUTURE) ×2 IMPLANT
SUT VIC AB 2-0 SH 27 (SUTURE) ×4
SUT VIC AB 2-0 SH 27XBRD (SUTURE) ×2 IMPLANT
SUT VIC AB 3-0 SH 27 (SUTURE) ×2
SUT VIC AB 3-0 SH 27X BRD (SUTURE) IMPLANT
SUTURE EHLN 3-0 FS-10 30 BLK (SUTURE) IMPLANT
SYR 10ML LL (SYRINGE) ×2 IMPLANT
WATER STERILE IRR 500ML POUR (IV SOLUTION) ×2 IMPLANT

## 2021-03-18 NOTE — Op Note (Signed)
  03/18/2021  8:47 AM  PATIENT:  Pricilla Holm Chanda    PRE-OPERATIVE DIAGNOSIS:  T84.50XA Infect/inflm reaction due to unsp int joint prosth, init  POST-OPERATIVE DIAGNOSIS:  Same  PROCEDURE:  HARDWARE REMOVAL, LEFT ELBOW, DEEP AND IRRIGATION AND DEBRIDEMENT OF SKIN, SUBCUTANEOUS TISSUE AND BONE  SURGEON:  Lovell Sheehan, MD  ANESTHESIA:   General  PREOPERATIVE INDICATIONS:  Wadie Mattie Crosley is a  87 y.o. female with a diagnosis of T84.50XA Infect/inflm reaction due to unsp int joint prosth, init who  elected for surgical management of the fracture  The risks benefits and alternatives were discussed with the patient preoperatively including but not limited to the risks of infection, bleeding, nerve injury, cardiopulmonary complications, the need for revision surgery, among others, and the patient was willing to proceed.  EBL: 2 CC  TOURNIQUET TIME: 8 MIN  OPERATIVE IMPLANTS: none used, two K-wires and 20 gauge wire removed  OPERATIVE FINDINGS: healed olecranon fracture with granulation tissue and clear drainage  OPERATIVE PROCEDURE:   After informed consent was obtained and the appropriate extremity marked in the pre-operative holding area, the patient was taken to the operating room and placed in the supine position. General anesthesia was induced and the left extremity was prepped and draped in standard sterile fashion. The extremity was exsanguinated and the tourniquet insufflated to 225 mmHg. The prior incision was utilized. Sharp dissection was taken down to the wires. The wire was cut and the K-wires and wire were removed with pliers and passed from the field. The granulation tissue, subcutaneous tissue and bone were debrided with a rongeur. The wound was irrigated. 3-0 Vicryl was used for the deep layer.  3-0 nylon was used for skin closure. A sterile dressing was applied. She tolerated the procedure well and was taken to the recovery room in good condition.   Elyn Aquas. Harlow Mares, MD

## 2021-03-18 NOTE — Anesthesia Preprocedure Evaluation (Signed)
Anesthesia Evaluation  Patient identified by MRN, date of birth, ID band Patient awake    Reviewed: Allergy & Precautions, NPO status , Patient's Chart, lab work & pertinent test results  History of Anesthesia Complications (+) PONV, Emergence Delirium and history of anesthetic complications  Airway Mallampati: IV   Neck ROM: Full    Dental   Missing several molars:   Pulmonary COPD,    Pulmonary exam normal breath sounds clear to auscultation       Cardiovascular hypertension, + Peripheral Vascular Disease and +CHF (diastolic)  Normal cardiovascular exam+ dysrhythmias (a fib on Eliquis)  Rhythm:Regular Rate:Normal  Hx DVT postop   Neuro/Psych PSYCHIATRIC DISORDERS (mixed Alzheimer and vascular dementia) Depression Dementia Chronic back pain    GI/Hepatic GERD  ,  Endo/Other  Hypothyroidism   Renal/GU Renal disease (stage IV CKD)     Musculoskeletal  (+) Arthritis ,   Abdominal   Peds  Hematology Breast CA   Anesthesia Other Findings Reviewed and agree with Bayard Males pre-anesthesia clinical review note.  Reproductive/Obstetrics                             Anesthesia Physical Anesthesia Plan  ASA: 3  Anesthesia Plan: General   Post-op Pain Management:    Induction: Intravenous  PONV Risk Score and Plan: 4 or greater and Ondansetron, Dexamethasone and Treatment may vary due to age or medical condition  Airway Management Planned: LMA  Additional Equipment:   Intra-op Plan:   Post-operative Plan: Extubation in OR  Informed Consent: I have reviewed the patients History and Physical, chart, labs and discussed the procedure including the risks, benefits and alternatives for the proposed anesthesia with the patient or authorized representative who has indicated his/her understanding and acceptance.   Patient has DNR.  Discussed DNR with patient, Discussed DNR with power of  attorney and Suspend DNR.   Dental advisory given and Consent reviewed with POA (daughter [place] at bedside)  Plan Discussed with: CRNA  Anesthesia Plan Comments: (Daughter consented for risks of anesthesia including but not limited to:  - postop delirium - adverse reactions to medications - damage to eyes, teeth, lips or other oral mucosa - nerve damage due to positioning  - sore throat or hoarseness - damage to heart, brain, nerves, lungs, other parts of body or loss of life  Informed daughter about role of CRNA in peri- and intra-operative care.  Daughter voiced understanding.)        Anesthesia Quick Evaluation

## 2021-03-18 NOTE — Anesthesia Postprocedure Evaluation (Signed)
Anesthesia Post Note  Patient: [name]  Procedure(s) Performed: HARDWARE REMOVAL (Left: Elbow)  Patient location during evaluation: PACU Anesthesia Type: General Level of consciousness: awake and alert and patient cooperative (at preop baseline) Pain management: pain level controlled Vital Signs Assessment: post-procedure vital signs reviewed and stable Respiratory status: spontaneous breathing, nonlabored ventilation and respiratory function stable Cardiovascular status: blood pressure returned to baseline and stable Postop Assessment: adequate PO intake Anesthetic complications: no   No notable events documented.   Last Vitals:  Vitals:   03/18/21 0917 03/18/21 0930  BP: 126/64 (!) 150/70  Pulse: 89 83  Resp: 16 14  Temp:  (!) 36.2 C  SpO2: 98% 97%    Last Pain:  Vitals:   03/18/21 0930  TempSrc: Temporal  PainSc: 0-No pain                 Darrin Nipper

## 2021-03-18 NOTE — H&P (Signed)
The patient has been re-examined, and the chart reviewed, and there have been no interval changes to the documented history and physical.  Plan a left elbow hardware removal today.  Anesthesia is not consulted regarding a peripheral nerve block for post-operative pain.  The risks, benefits, and alternatives have been discussed at length, and the patient is willing to proceed.

## 2021-03-18 NOTE — Discharge Instructions (Addendum)
Keep bandage in place until Wednesday, and then remove and place band-aid Return to the office on Dec. 20 for suture removal, we will call with time Call with any questions or concerns such as wound drainage, shortness of breath or fever > 101.5 degrees   AMBULATORY SURGERY  DISCHARGE INSTRUCTIONS   The drugs that you were given will stay in your system until tomorrow so for the next 24 hours you should not:  Drive an automobile Make any legal decisions Drink any alcoholic beverage   You may resume regular meals tomorrow.  Today it is better to start with liquids and gradually work up to solid foods.  You may eat anything you prefer, but it is better to start with liquids, then soup and crackers, and gradually work up to solid foods.   Please notify your doctor immediately if you have any unusual bleeding, trouble breathing, redness and pain at the surgery site, drainage, fever, or pain not relieved by medication.    Additional Instructions:        Please contact your physician with any problems or Same Day Surgery at [phone], Monday through Friday 6 am to 4 pm, or [place] at [facility] number at [phone].

## 2021-03-18 NOTE — Anesthesia Procedure Notes (Signed)
Procedure Name: LMA Insertion Date/Time: 03/18/2021 7:47 AM Performed by: Chanetta Marshall, CRNA Pre-anesthesia Checklist: Patient identified, Emergency Drugs available, Suction available and Patient being monitored Patient Re-evaluated:Patient Re-evaluated prior to induction Oxygen Delivery Method: Circle system utilized Preoxygenation: Pre-oxygenation with 100% oxygen Induction Type: IV induction Ventilation: Mask ventilation without difficulty LMA: LMA inserted LMA Size: 3.0 Number of attempts: 1 Placement Confirmation: positive ETCO2, breath sounds checked- equal and bilateral and CO2 detector Tube secured with: Tape Dental Injury: Teeth and Oropharynx as per pre-operative assessment

## 2021-03-18 NOTE — Transfer of Care (Signed)
Immediate Anesthesia Transfer of Care Note  Patient: [name]  Procedure(s) Performed: HARDWARE REMOVAL (Left: Elbow)  Patient Location: PACU  Anesthesia Type:General  Level of Consciousness: awake  Airway & Oxygen Therapy: Patient Spontanous Breathing and Patient connected to face mask oxygen  Post-op Assessment: Report given to RN and Post -op Vital signs reviewed and stable  Post vital signs: Reviewed and stable  Last Vitals:  Vitals Value Taken Time  BP    Temp    Pulse    Resp    SpO2      Last Pain:  Vitals:   03/18/21 0659  TempSrc: Oral         Complications: No notable events documented.

## 2021-03-18 NOTE — OR Nursing (Signed)
Per Dr. Harlow Mares, secure-chat, ok for pt to resume taking aspirin today, added to d/c instructions.

## 2021-03-21 NOTE — Progress Notes (Signed)
Spoke with the pt's daughter.

## 2021-12-18 ENCOUNTER — Emergency Department: Payer: Medicare HMO

## 2021-12-18 ENCOUNTER — Other Ambulatory Visit: Payer: Self-pay

## 2021-12-18 DIAGNOSIS — S72402A Unspecified fracture of lower end of left femur, initial encounter for closed fracture: Principal | ICD-10-CM | POA: Diagnosis present

## 2021-12-18 DIAGNOSIS — I5032 Chronic diastolic (congestive) heart failure: Secondary | ICD-10-CM | POA: Diagnosis present

## 2021-12-18 DIAGNOSIS — Z853 Personal history of malignant neoplasm of breast: Secondary | ICD-10-CM

## 2021-12-18 DIAGNOSIS — Z882 Allergy status to sulfonamides status: Secondary | ICD-10-CM

## 2021-12-18 DIAGNOSIS — F32A Depression, unspecified: Secondary | ICD-10-CM | POA: Diagnosis present

## 2021-12-18 DIAGNOSIS — I4510 Unspecified right bundle-branch block: Secondary | ICD-10-CM | POA: Diagnosis present

## 2021-12-18 DIAGNOSIS — Z880 Allergy status to penicillin: Secondary | ICD-10-CM

## 2021-12-18 DIAGNOSIS — Z9181 History of falling: Secondary | ICD-10-CM

## 2021-12-18 DIAGNOSIS — N184 Chronic kidney disease, stage 4 (severe): Secondary | ICD-10-CM | POA: Diagnosis present

## 2021-12-18 DIAGNOSIS — F0280 Dementia in other diseases classified elsewhere without behavioral disturbance: Secondary | ICD-10-CM | POA: Diagnosis present

## 2021-12-18 DIAGNOSIS — W010XXA Fall on same level from slipping, tripping and stumbling without subsequent striking against object, initial encounter: Secondary | ICD-10-CM | POA: Diagnosis present

## 2021-12-18 DIAGNOSIS — Z86718 Personal history of other venous thrombosis and embolism: Secondary | ICD-10-CM

## 2021-12-18 DIAGNOSIS — I739 Peripheral vascular disease, unspecified: Secondary | ICD-10-CM | POA: Diagnosis present

## 2021-12-18 DIAGNOSIS — Z66 Do not resuscitate: Secondary | ICD-10-CM | POA: Diagnosis present

## 2021-12-18 DIAGNOSIS — M5136 Other intervertebral disc degeneration, lumbar region: Secondary | ICD-10-CM | POA: Diagnosis present

## 2021-12-18 DIAGNOSIS — Z90722 Acquired absence of ovaries, bilateral: Secondary | ICD-10-CM

## 2021-12-18 DIAGNOSIS — K219 Gastro-esophageal reflux disease without esophagitis: Secondary | ICD-10-CM | POA: Diagnosis present

## 2021-12-18 DIAGNOSIS — D696 Thrombocytopenia, unspecified: Secondary | ICD-10-CM | POA: Diagnosis present

## 2021-12-18 DIAGNOSIS — Z961 Presence of intraocular lens: Secondary | ICD-10-CM | POA: Diagnosis present

## 2021-12-18 DIAGNOSIS — E034 Atrophy of thyroid (acquired): Secondary | ICD-10-CM | POA: Diagnosis present

## 2021-12-18 DIAGNOSIS — G309 Alzheimer's disease, unspecified: Secondary | ICD-10-CM | POA: Diagnosis present

## 2021-12-18 DIAGNOSIS — M9712XA Periprosthetic fracture around internal prosthetic left knee joint, initial encounter: Secondary | ICD-10-CM | POA: Diagnosis present

## 2021-12-18 DIAGNOSIS — Z888 Allergy status to other drugs, medicaments and biological substances status: Secondary | ICD-10-CM

## 2021-12-18 DIAGNOSIS — E785 Hyperlipidemia, unspecified: Secondary | ICD-10-CM | POA: Diagnosis present

## 2021-12-18 DIAGNOSIS — I130 Hypertensive heart and chronic kidney disease with heart failure and stage 1 through stage 4 chronic kidney disease, or unspecified chronic kidney disease: Secondary | ICD-10-CM | POA: Diagnosis present

## 2021-12-18 DIAGNOSIS — I480 Paroxysmal atrial fibrillation: Secondary | ICD-10-CM | POA: Diagnosis present

## 2021-12-18 DIAGNOSIS — D631 Anemia in chronic kidney disease: Secondary | ICD-10-CM | POA: Diagnosis present

## 2021-12-18 DIAGNOSIS — Z881 Allergy status to other antibiotic agents status: Secondary | ICD-10-CM

## 2021-12-18 DIAGNOSIS — Z96651 Presence of right artificial knee joint: Secondary | ICD-10-CM | POA: Diagnosis present

## 2021-12-18 DIAGNOSIS — Z79899 Other long term (current) drug therapy: Secondary | ICD-10-CM

## 2021-12-18 DIAGNOSIS — Z7901 Long term (current) use of anticoagulants: Secondary | ICD-10-CM

## 2021-12-18 DIAGNOSIS — Z9079 Acquired absence of other genital organ(s): Secondary | ICD-10-CM

## 2021-12-18 DIAGNOSIS — Z885 Allergy status to narcotic agent status: Secondary | ICD-10-CM

## 2021-12-18 DIAGNOSIS — F0150 Vascular dementia without behavioral disturbance: Secondary | ICD-10-CM | POA: Diagnosis present

## 2021-12-18 DIAGNOSIS — Z79890 Hormone replacement therapy (postmenopausal): Secondary | ICD-10-CM

## 2021-12-18 DIAGNOSIS — Z923 Personal history of irradiation: Secondary | ICD-10-CM

## 2021-12-18 DIAGNOSIS — Z7952 Long term (current) use of systemic steroids: Secondary | ICD-10-CM

## 2021-12-18 DIAGNOSIS — G8929 Other chronic pain: Secondary | ICD-10-CM | POA: Diagnosis present

## 2021-12-18 DIAGNOSIS — Z90710 Acquired absence of both cervix and uterus: Secondary | ICD-10-CM

## 2021-12-18 DIAGNOSIS — Z803 Family history of malignant neoplasm of breast: Secondary | ICD-10-CM

## 2021-12-18 DIAGNOSIS — M1990 Unspecified osteoarthritis, unspecified site: Secondary | ICD-10-CM | POA: Diagnosis present

## 2021-12-18 DIAGNOSIS — Z9011 Acquired absence of right breast and nipple: Secondary | ICD-10-CM

## 2021-12-18 DIAGNOSIS — Z7982 Long term (current) use of aspirin: Secondary | ICD-10-CM

## 2021-12-18 DIAGNOSIS — Z8249 Family history of ischemic heart disease and other diseases of the circulatory system: Secondary | ICD-10-CM

## 2021-12-18 DIAGNOSIS — R4781 Slurred speech: Secondary | ICD-10-CM | POA: Diagnosis not present

## 2021-12-18 DIAGNOSIS — J449 Chronic obstructive pulmonary disease, unspecified: Secondary | ICD-10-CM | POA: Diagnosis present

## 2021-12-18 LAB — COMPREHENSIVE METABOLIC PANEL
ALT: 26 U/L (ref 0–44)
AST: 29 U/L (ref 15–41)
Albumin: 4.1 g/dL (ref 3.5–5.0)
Alkaline Phosphatase: 46 U/L (ref 38–126)
Anion gap: 8 (ref 5–15)
BUN: 28 mg/dL — ABNORMAL HIGH (ref 8–23)
CO2: 26 mmol/L (ref 22–32)
Calcium: 9.7 mg/dL (ref 8.9–10.3)
Chloride: 105 mmol/L (ref 98–111)
Creatinine, Ser: 1.28 mg/dL — ABNORMAL HIGH (ref 0.44–1.00)
GFR, Estimated: 41 mL/min — ABNORMAL LOW (ref >60)
Glucose, Bld: 112 mg/dL — ABNORMAL HIGH (ref 70–99)
Potassium: 3.8 mmol/L (ref 3.5–5.1)
Sodium: 139 mmol/L (ref 135–145)
Total Bilirubin: 0.7 mg/dL (ref 0.3–1.2)
Total Protein: 7 g/dL (ref 6.5–8.1)

## 2021-12-18 LAB — CBC
HCT: 42.5 % (ref 36.0–46.0)
Hemoglobin: 13.6 g/dL (ref 12.0–15.0)
MCH: 32.5 pg (ref 26.0–34.0)
MCHC: 32 g/dL (ref 30.0–36.0)
MCV: 101.7 fL — ABNORMAL HIGH (ref 80.0–100.0)
Platelets: 169 10*3/uL (ref 150–400)
RBC: 4.18 MIL/uL (ref 3.87–5.11)
RDW: 13.2 % (ref 11.5–15.5)
WBC: 10.5 10*3/uL (ref 4.0–10.5)
nRBC: 0 % (ref 0.0–0.2)

## 2021-12-18 LAB — TROPONIN I (HIGH SENSITIVITY): Troponin I (High Sensitivity): 6 ng/L (ref <18)

## 2021-12-18 NOTE — ED Triage Notes (Signed)
FIRST NURSE NOTE:  Pt arrived via [facility] from [place], pt had a fall around 2100, pt reported to EMS that she fell reaching for her blinds, pt reported she slid out of her chair onto L knee, pt is not able to bear weight to L leg.  146/84 P-92 96%RA

## 2021-12-18 NOTE — ED Triage Notes (Signed)
Pt presents to ER via ems from [place] with c/o fall x2 tonight.  Per ems, pt was trying to get up to close blinds, and lost her balance and fell.  Pt c/o left knee pain after falling and states she is unable to bear weight on her left knee, which caused her to have second fall.  Pt has small amt of swelling to left knee.  No pain on palpation.  Pt is A&O x4 at this time and in NAD in triage.  Denies hitting head, LOC and denies any blood thinners.

## 2021-12-19 ENCOUNTER — Emergency Department: Payer: Medicare HMO

## 2021-12-19 ENCOUNTER — Inpatient Hospital Stay
Admission: EM | Admit: 2021-12-19 | Discharge: 2021-12-22 | DRG: 534 | Disposition: A | Discharge status: Skilled Nursing Facility | Payer: Medicare HMO | Attending: Internal Medicine | Admitting: Internal Medicine

## 2021-12-19 DIAGNOSIS — I5032 Chronic diastolic (congestive) heart failure: Secondary | ICD-10-CM

## 2021-12-19 DIAGNOSIS — F0280 Dementia in other diseases classified elsewhere without behavioral disturbance: Secondary | ICD-10-CM | POA: Diagnosis present

## 2021-12-19 DIAGNOSIS — I739 Peripheral vascular disease, unspecified: Secondary | ICD-10-CM | POA: Diagnosis present

## 2021-12-19 DIAGNOSIS — S72402A Unspecified fracture of lower end of left femur, initial encounter for closed fracture: Secondary | ICD-10-CM

## 2021-12-19 DIAGNOSIS — M9712XA Periprosthetic fracture around internal prosthetic left knee joint, initial encounter: Secondary | ICD-10-CM | POA: Diagnosis present

## 2021-12-19 DIAGNOSIS — I480 Paroxysmal atrial fibrillation: Secondary | ICD-10-CM | POA: Diagnosis present

## 2021-12-19 DIAGNOSIS — Z9079 Acquired absence of other genital organ(s): Secondary | ICD-10-CM | POA: Diagnosis not present

## 2021-12-19 DIAGNOSIS — M1990 Unspecified osteoarthritis, unspecified site: Secondary | ICD-10-CM | POA: Diagnosis present

## 2021-12-19 DIAGNOSIS — I130 Hypertensive heart and chronic kidney disease with heart failure and stage 1 through stage 4 chronic kidney disease, or unspecified chronic kidney disease: Secondary | ICD-10-CM | POA: Diagnosis present

## 2021-12-19 DIAGNOSIS — N1832 Chronic kidney disease, stage 3b: Secondary | ICD-10-CM

## 2021-12-19 DIAGNOSIS — J449 Chronic obstructive pulmonary disease, unspecified: Secondary | ICD-10-CM | POA: Diagnosis present

## 2021-12-19 DIAGNOSIS — N1831 Chronic kidney disease, stage 3a: Secondary | ICD-10-CM | POA: Diagnosis not present

## 2021-12-19 DIAGNOSIS — I4510 Unspecified right bundle-branch block: Secondary | ICD-10-CM | POA: Diagnosis present

## 2021-12-19 DIAGNOSIS — S72402S Unspecified fracture of lower end of left femur, sequela: Secondary | ICD-10-CM | POA: Diagnosis not present

## 2021-12-19 DIAGNOSIS — Z66 Do not resuscitate: Secondary | ICD-10-CM | POA: Diagnosis present

## 2021-12-19 DIAGNOSIS — K219 Gastro-esophageal reflux disease without esophagitis: Secondary | ICD-10-CM | POA: Diagnosis present

## 2021-12-19 DIAGNOSIS — M5136 Other intervertebral disc degeneration, lumbar region: Secondary | ICD-10-CM | POA: Diagnosis present

## 2021-12-19 DIAGNOSIS — M25562 Pain in left knee: Secondary | ICD-10-CM

## 2021-12-19 DIAGNOSIS — E785 Hyperlipidemia, unspecified: Secondary | ICD-10-CM | POA: Diagnosis present

## 2021-12-19 DIAGNOSIS — W010XXA Fall on same level from slipping, tripping and stumbling without subsequent striking against object, initial encounter: Secondary | ICD-10-CM | POA: Diagnosis present

## 2021-12-19 DIAGNOSIS — G8929 Other chronic pain: Secondary | ICD-10-CM | POA: Diagnosis present

## 2021-12-19 DIAGNOSIS — D696 Thrombocytopenia, unspecified: Secondary | ICD-10-CM

## 2021-12-19 DIAGNOSIS — F32A Depression, unspecified: Secondary | ICD-10-CM | POA: Diagnosis present

## 2021-12-19 DIAGNOSIS — D631 Anemia in chronic kidney disease: Secondary | ICD-10-CM | POA: Diagnosis present

## 2021-12-19 DIAGNOSIS — R4781 Slurred speech: Secondary | ICD-10-CM | POA: Diagnosis not present

## 2021-12-19 DIAGNOSIS — E034 Atrophy of thyroid (acquired): Secondary | ICD-10-CM | POA: Diagnosis present

## 2021-12-19 DIAGNOSIS — G309 Alzheimer's disease, unspecified: Secondary | ICD-10-CM | POA: Diagnosis present

## 2021-12-19 DIAGNOSIS — N184 Chronic kidney disease, stage 4 (severe): Secondary | ICD-10-CM | POA: Diagnosis present

## 2021-12-19 DIAGNOSIS — W19XXXD Unspecified fall, subsequent encounter: Secondary | ICD-10-CM | POA: Diagnosis not present

## 2021-12-19 DIAGNOSIS — F0150 Vascular dementia without behavioral disturbance: Secondary | ICD-10-CM | POA: Diagnosis present

## 2021-12-19 DIAGNOSIS — W19XXXA Unspecified fall, initial encounter: Secondary | ICD-10-CM

## 2021-12-19 LAB — URINALYSIS, ROUTINE W REFLEX MICROSCOPIC
Bilirubin Urine: NEGATIVE
Glucose, UA: NEGATIVE mg/dL
Hgb urine dipstick: NEGATIVE
Ketones, ur: NEGATIVE mg/dL
Leukocytes,Ua: NEGATIVE
Nitrite: POSITIVE — AB
Protein, ur: NEGATIVE mg/dL
Specific Gravity, Urine: 1.01 (ref 1.005–1.030)
WBC, UA: NONE SEEN WBC/hpf (ref 0–5)
pH: 6 (ref 5.0–8.0)

## 2021-12-19 LAB — TROPONIN I (HIGH SENSITIVITY): Troponin I (High Sensitivity): 6 ng/L (ref <18)

## 2021-12-19 MED ORDER — ACETAMINOPHEN 325 MG PO TABS
650.0000 mg | ORAL_TABLET | ORAL | Status: DC | PRN
  Administered 2021-12-21: 650 mg via ORAL
  Filled 2021-12-19: qty 2

## 2021-12-19 MED ORDER — ONDANSETRON HCL 4 MG PO TABS: 4.0000 mg | ORAL_TABLET | Freq: Four times a day (QID) | ORAL | Status: DC | PRN

## 2021-12-19 MED ORDER — ASPIRIN 81 MG PO TBEC
81.0000 mg | DELAYED_RELEASE_TABLET | Freq: Every day | ORAL | Status: DC
  Administered 2021-12-19 – 2021-12-22 (×4): 81 mg via ORAL
  Filled 2021-12-19 (×4): qty 1

## 2021-12-19 MED ORDER — ONDANSETRON HCL 4 MG/2ML IJ SOLN
4.0000 mg | Freq: Once | INTRAMUSCULAR | Status: AC
  Administered 2021-12-19: 4 mg via INTRAVENOUS

## 2021-12-19 MED ORDER — VITAMIN D 25 MCG (1000 UNIT) PO TABS
5000.0000 [IU] | ORAL_TABLET | Freq: Every day | ORAL | Status: DC
  Administered 2021-12-19 – 2021-12-22 (×4): 5000 [IU] via ORAL
  Filled 2021-12-19 (×4): qty 5

## 2021-12-19 MED ORDER — SODIUM CHLORIDE 0.9 % IV SOLN: 250.0000 mL | INTRAVENOUS | Status: DC | PRN

## 2021-12-19 MED ORDER — SODIUM CHLORIDE 0.9% FLUSH
3.0000 mL | Freq: Two times a day (BID) | INTRAVENOUS | Status: DC
  Administered 2021-12-19 – 2021-12-22 (×6): 3 mL via INTRAVENOUS

## 2021-12-19 MED ORDER — FENTANYL CITRATE PF 50 MCG/ML IJ SOSY
50.0000 ug | PREFILLED_SYRINGE | Freq: Once | INTRAMUSCULAR | Status: AC
  Administered 2021-12-19: 50 ug via INTRAVENOUS
  Filled 2021-12-19: qty 1

## 2021-12-19 MED ORDER — ENOXAPARIN SODIUM 30 MG/0.3ML IJ SOSY
30.0000 mg | PREFILLED_SYRINGE | INTRAMUSCULAR | Status: DC
  Administered 2021-12-19: 30 mg via SUBCUTANEOUS
  Filled 2021-12-19: qty 0.3

## 2021-12-19 MED ORDER — SERTRALINE HCL 50 MG PO TABS
100.0000 mg | ORAL_TABLET | Freq: Every day | ORAL | Status: DC
  Administered 2021-12-19 – 2021-12-22 (×4): 100 mg via ORAL
  Filled 2021-12-19 (×4): qty 2

## 2021-12-19 MED ORDER — OYSTER SHELL CALCIUM/D3 500-5 MG-MCG PO TABS
1.0000 | ORAL_TABLET | Freq: Every day | ORAL | Status: DC
  Administered 2021-12-20 – 2021-12-22 (×3): 1 via ORAL
  Filled 2021-12-19 (×3): qty 1

## 2021-12-19 MED ORDER — FAMOTIDINE 20 MG PO TABS: 40.0000 mg | ORAL_TABLET | Freq: Every day | ORAL | Status: DC

## 2021-12-19 MED ORDER — BUDESONIDE 3 MG PO CPEP
6.0000 mg | ORAL_CAPSULE | Freq: Every morning | ORAL | Status: DC
  Administered 2021-12-20 – 2021-12-22 (×3): 6 mg via ORAL
  Filled 2021-12-19 (×4): qty 2

## 2021-12-19 MED ORDER — ONDANSETRON HCL 4 MG/2ML IJ SOLN
INTRAMUSCULAR | Status: AC
  Filled 2021-12-19: qty 2

## 2021-12-19 MED ORDER — LEVOTHYROXINE SODIUM 125 MCG PO TABS
125.0000 ug | ORAL_TABLET | Freq: Every day | ORAL | Status: DC
  Administered 2021-12-20 – 2021-12-22 (×3): 125 ug via ORAL
  Filled 2021-12-19 (×4): qty 1

## 2021-12-19 MED ORDER — ACETAMINOPHEN 500 MG PO TABS
1000.0000 mg | ORAL_TABLET | Freq: Once | ORAL | Status: AC
  Administered 2021-12-19: 1000 mg via ORAL
  Filled 2021-12-19: qty 2

## 2021-12-19 MED ORDER — SODIUM CHLORIDE 0.9% FLUSH: 3.0000 mL | INTRAVENOUS | Status: DC | PRN

## 2021-12-19 MED ORDER — ONDANSETRON HCL 4 MG/2ML IJ SOLN: 4.0000 mg | Freq: Four times a day (QID) | INTRAMUSCULAR | Status: DC | PRN

## 2021-12-19 MED ORDER — METOPROLOL SUCCINATE ER 25 MG PO TB24
25.0000 mg | ORAL_TABLET | Freq: Every day | ORAL | Status: DC
  Administered 2021-12-19 – 2021-12-22 (×4): 25 mg via ORAL
  Filled 2021-12-19 (×4): qty 1

## 2021-12-19 MED ORDER — SPIRONOLACTONE 25 MG PO TABS
25.0000 mg | ORAL_TABLET | Freq: Every day | ORAL | Status: DC
  Administered 2021-12-19 – 2021-12-22 (×4): 25 mg via ORAL
  Filled 2021-12-19 (×4): qty 1

## 2021-12-19 MED ORDER — TRAMADOL HCL 50 MG PO TABS
50.0000 mg | ORAL_TABLET | Freq: Three times a day (TID) | ORAL | Status: DC | PRN
  Administered 2021-12-19 – 2021-12-22 (×5): 50 mg via ORAL
  Filled 2021-12-19 (×5): qty 1

## 2021-12-19 MED ORDER — PAROXETINE HCL 20 MG PO TABS: 20.0000 mg | ORAL_TABLET | Freq: Every day | ORAL | Status: DC

## 2021-12-19 MED ORDER — AMIODARONE HCL 200 MG PO TABS
100.0000 mg | ORAL_TABLET | Freq: Every day | ORAL | Status: DC
  Administered 2021-12-19 – 2021-12-22 (×4): 100 mg via ORAL
  Filled 2021-12-19 (×4): qty 1

## 2021-12-19 MED ORDER — ADULT MULTIVITAMIN W/MINERALS CH
1.0000 | ORAL_TABLET | Freq: Every day | ORAL | Status: DC
Start: 2021-12-19 — End: 2021-12-22
  Administered 2021-12-19 – 2021-12-22 (×4): 1 via ORAL
  Filled 2021-12-19 (×4): qty 1

## 2021-12-19 NOTE — Assessment & Plan Note (Deleted)
Status post fall with left knee pain and inability to bear weight on her left lower extremity. Imaging suspicious for a possible occult fracture of the distal femur Appreciate orthopedic surgery input Patient is currently in the left knee immobilizer Pain control Touchdown weightbearing of her left lower extremity for 7 to 10 days after which she can transition to a hinged knee brace and knee motion as tolerated PT evaluation Fall precautions

## 2021-12-19 NOTE — Plan of Care (Signed)
  Problem: Nutrition: Goal: Adequate nutrition will be maintained Outcome: Progressing   Problem: Coping: Goal: Level of anxiety will decrease Outcome: Progressing   Problem: Pain Managment: Goal: General experience of comfort will improve Outcome: Progressing   

## 2021-12-19 NOTE — ED Notes (Signed)
Report to Ana, RN.

## 2021-12-19 NOTE — Assessment & Plan Note (Addendum)
Continue amiodarone and metoprolol for rate control. Patient not on long-term anticoagulation therapy most likely due to increased risk for falls

## 2021-12-19 NOTE — ED Provider Notes (Signed)
[facility] Provider Note    Event Date/Time   First MD Initiated Contact with Patient 12/19/21 0246     (approximate)   History   Fall and Knee Pain   HPI  [name] is a 88 y.o. female who presents to the ED for evaluation of Fall and Knee Pain   I reviewed PCP visit from May.  Resides at a local SNF.  History of A-fib on amiodarone, hypothyroid, diastolic dysfunction and CKD 3.  Patient presents to the ED with left knee pain after a fall.  She reports that she was reaching up to close the blinds tonight when she tripped and fell, landing on her left knee and causing pain.  She reports difficulty getting back up and bearing weight on this leg due to the pain to her left knee and Supple back down to the ground.  No syncope, head trauma or other injuries.  No other pain   Physical Exam   Triage Vital Signs: ED Triage Vitals  Enc Vitals Group     BP 12/18/21 2311 (!) 172/95     Pulse Rate 12/18/21 2311 (!) 101     Resp 12/18/21 2311 18     Temp 12/18/21 2311 98 F (36.7 C)     Temp Source 12/18/21 2311 Oral     SpO2 12/18/21 2311 98 %     Weight 12/18/21 2315 130 lb (59 kg)     Height 12/18/21 2315 '5\' 8"'$  (1.727 m)     Head Circumference --      Peak Flow --      Pain Score 12/18/21 2314 5     Pain Loc --      Pain Edu? --      Excl. in [place]? --     Most recent vital signs: Vitals:   12/19/21 0430 12/19/21 0500  BP: (!) 152/74 (!) 154/89  Pulse: 94 95  Resp:  19  Temp:    SpO2: 98% 100%    General: Awake, no distress.  Supine, looks well, pleasant and conversational.  Freely moving bilateral upper extremities. CV:  Good peripheral perfusion.  Resp:  Normal effort.  Abd:  No distention.  Soft and benign MSK:  Left knee has a well-healed TKA incision without laceration.  Mild diffuse swelling and effusion is noted.  Left foot is distally neurovascularly intact.  Tenderness along the joint line is present. Palpation of all 4 extremities  otherwise without evidence of deformity, tenderness or trauma beyond this left knee pain. Neuro:  No focal deficits appreciated. Other:     ED Results / Procedures / Treatments   Labs (all labs ordered are listed, but only abnormal results are displayed) Labs Reviewed  CBC - Abnormal; Notable for the following components:      Result Value   MCV 101.7 (*)    All other components within normal limits  COMPREHENSIVE METABOLIC PANEL - Abnormal; Notable for the following components:   Glucose, Bld 112 (*)    BUN 28 (*)    Creatinine, Ser 1.28 (*)    GFR, Estimated 41 (*)    All other components within normal limits  URINALYSIS, ROUTINE W REFLEX MICROSCOPIC - Abnormal; Notable for the following components:   Color, Urine YELLOW (*)    APPearance CLOUDY (*)    Nitrite POSITIVE (*)    Bacteria, UA RARE (*)    All other components within normal limits  TROPONIN I (HIGH SENSITIVITY)  TROPONIN  I (HIGH SENSITIVITY)    EKG Tremulous baseline makes fine detail difficult to interpret, but seems to demonstrate atrial fibrillation with a rate of 90 bpm.  Right bundle.  No STEMI.  RADIOLOGY Plain film of the left knee without obvious fracture or dislocation.  Official radiology report(s): CT Knee Left Wo Contrast  Result Date: 12/19/2021 CLINICAL DATA:  88 year old female status post fall with twisting motion. Total knee arthroplasty. Heme arthrosis suspicious for occult fracture on plain radiographs yesterday. EXAM: CT OF THE LEFT KNEE WITHOUT CONTRAST TECHNIQUE: Multidetector CT imaging of the left knee was performed according to the standard protocol. Multiplanar CT image reconstructions were also generated. RADIATION DOSE REDUCTION: This exam was performed according to the departmental dose-optimization program which includes automated exposure control, adjustment of the mA and/or kV according to patient size and/or use of iterative reconstruction technique. COMPARISON:  Knee radiographs  12/18/2021. FINDINGS: Total knee arthroplasty with considerable hardware streak artifact despite technical attempts to limit this. Underlying pronounced osteopenia. Confirmed knee joint lipohemarthrosis (series 10, image 63) primarily in the suprapatellar joint space. No hardware failure or malalignment is evident. Detail of the patella is limited, especially the distal patella. No fracture of the proximal tibia or fibula is identified. With regard to the distal femur, there is subtle cortical irregularity along the posterior condyles, including laterally on series 13, images 30 and 31. Nondisplaced fracture there is possible. Calcified peripheral vascular disease. No extra-articular fluid collection. IMPRESSION: 1. Total knee arthroplasty with substantial hardware artifact and underlying osteopenia. 2. Confirmed lipohemarthrosis. Subtle cortical irregularity along the distal femur (left posterolateral femoral condyle series 13, image 30 and lateral metadiaphysis series 12, image 47), suspicious for nondisplaced fracture(s). Limited detail of the patella due to artifact. No strong evidence of patella fracture on the earlier radiographs. No proximal tibia or fibula fracture identified. 3. Recommend Orthopedic consultation. Electronically Signed   By: Genevie Ann M.D.   On: 12/19/2021 04:40   DG Knee Complete 4 Views Left  Result Date: 12/19/2021 CLINICAL DATA:  Fall and left knee pain. EXAM: LEFT KNEE - COMPLETE 4+ VIEW COMPARISON:  Left lower extremity radiograph dated 01/17/2021. FINDINGS: There is a total left knee arthroplasty. The arthroplasty components appear intact and in anatomic alignment. Focal area of irregularity of the medial tibial plateau may appears chronic. No acute fracture identified. No dislocation. The bones are osteopenic. There is a moderate suprapatellar effusion with fluid fluid level consistent with lipohemarthrosis and suspicious for an occult fracture. IMPRESSION: 1. No obvious acute  fracture or dislocation. 2. Moderate suprapatellar lipohemarthrosis suspicious for an occult fracture. 3. Total left knee arthroplasty appears intact and in anatomic alignment. Electronically Signed   By: Anner Crete M.D.   On: 12/19/2021 00:14    PROCEDURES and INTERVENTIONS:  .1-3 Lead EKG Interpretation  Performed by: Vladimir Crofts, MD Authorized by: Vladimir Crofts, MD     Interpretation: normal     ECG rate:  88   ECG rate assessment: normal     Rhythm: atrial fibrillation     Ectopy: none     Conduction: normal     Medications  acetaminophen (TYLENOL) tablet 1,000 mg (1,000 mg Oral Given 12/19/21 0327)  fentaNYL (SUBLIMAZE) injection 50 mcg (50 mcg Intravenous Given 12/19/21 0538)     IMPRESSION / MDM / ASSESSMENT AND PLAN / ED COURSE  I reviewed the triage vital signs and the nursing notes.  Differential diagnosis includes, but is not limited to,   {Patient presents with symptoms of  an acute illness or injury that is potentially life-threatening.  Pleasant 88 year old presents to the ED with acute left knee pain after mechanical fall, with evidence of a nondisplaced periprosthetic fracture to her distal femur requiring medical admission for pain control and PT.  Looks systemically well overall.  Has no signs of trauma beyond tenderness to the lateral aspect of her left knee.  No signs of neurologic or vascular deficits.  Screening blood work is benign with CKD around baseline, normal CBC and negative troponins.  Urine without infectious features and she has had no preceding urinary symptoms.  X-ray of her left knee questions a fracture due to lipohemarthrosis, so subsequent CT scan obtained with some evidence of nondisplaced fracture.  I consult orthopedics who recommends knee immobilizer and toe-touch weightbearing.  Due to her likely needing increased level of care as well as pain control, we will consult with medicine for admission.  Clinical Course as of 12/19/21 5537  [facility] Dec 19, 2021  0502 CT noted. [place] paged [DS]  [phone] Immediate callback, he pulling up images and will message me back [DS]    Clinical Course User Index [DS] Vladimir Crofts, MD     FINAL CLINICAL IMPRESSION(S) / ED DIAGNOSES   Final diagnoses:  Periprosthetic fracture around internal prosthetic left knee joint, initial encounter  Fall, initial encounter  Acute pain of left knee     Rx / DC Orders   ED Discharge Orders     None        Note:  This document was prepared using Dragon voice recognition software and may include unintentional dictation errors.   Vladimir Crofts, MD 12/19/21 [phone]

## 2021-12-19 NOTE — H&P (Addendum)
History and Physical    Patient: [name] JGG:[id number] DOB: 05/28/1932 DOA: 12/19/2021 DOS: the patient was seen and examined on 12/19/2021 PCP: Rusty Aus, MD  Patient coming from: ALF/ILF  Chief Complaint:  Chief Complaint  Patient presents with   Fall   Knee Pain   HPI: [name] is a 88 y.o. female with medical history significant for chronic diastolic dysfunction CHF, stage IV chronic kidney disease, right breast cancer status postlumpectomy and radiation therapy, history of collagenous colitis, depression, hypertension, hypothyroidism who was brought into the ER for evaluation following a mechanical fall and complains of left knee pain. Patient states that she was trying to close a window blinds and thinks her legs got tangled up and she fell landing on her knees.  She complained of severe pain in her left knee and was unable to bear weight on that knee.  She denied any head trauma loss of consciousness following the fall.  She denied feeling dizzy or lightheaded prior to the fall and at baseline ambulates with a rolling walker which she had when this happened. She denies having any nausea, no vomiting, no chest pain, no shortness of breath, no fever, no chills, no urinary symptoms, no blurred vision no focal deficit. She had a left knee x-ray which showed no obvious acute fracture or dislocation. Moderate suprapatellar lipohemarthrosis suspicious for an occult fracture. Total left knee arthroplasty appears intact and in anatomic alignment. Orthopedic surgery consult was requested and patient will be admitted for further evaluation    Review of Systems: As mentioned in the history of present illness. All other systems reviewed and are negative. Past Medical History:  Diagnosis Date   (HFpEF) heart failure with preserved ejection fraction ([place])    a.) TTE 12/2015: EF 60-65%. no rwma, G2DD, triv AI, mild MR, mildly dil LA. Mild-mod TR. PASP 20mHg; b.) TTE 03/01/2018: EF  55-60%, no rwma, mild MR, mildly dil LA, nl PASP. c.) TTE 10/26/2020: EF >55%; mild RA and mod LA enlargement. Triv-mild panvalvular regurgitation.   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   Edema    FEET/ANKLES   GERD (gastroesophageal reflux disease)    History of sepsis    History of stress test    a. 08/2010: EF 73%, no ischemia/infarct.   Hyperlipidemia    Hypertension    Hypothyroid    Lumbar disc disease    Mixed Alzheimer's and vascular dementia ([facility]    PAD (peripheral artery disease) ([facility]    a. 08/2015 s/p PTA of R AT w/ drug-coated balloon angioplasty to R Popliteal; b. 05/2017 ABI: R 1.16, L 0.88-->stable.   PAF (paroxysmal atrial fibrillation) (  [place])    a.) CHA2DS2-VASc = 8 (age x 2, sex, HFpEF, HTN, DVT x2, PVD). b.) rate/rhythm maintained on amiodarone + metoprolol succinate; chronically anticoagulated with low dose apixaban + ASA. c.) s/p DCCV 08/26/2010 and 11/10/2011   PONV (postoperative nausea and vomiting)    nausea vomiting long ago with surgery but not recent ones   RBBB (right bundle branch block)    Past Surgical History:  Procedure Laterality Date   ABDOMINAL EXPLORATION SURGERY     "had to go back in after hysterectomy & check on stitch dr had put near my bladder; don't know if they took it out; had to  wear catheter for 1 month"   ABDOMINAL HYSTERECTOMY     with BSO   APPENDECTOMY     BACK SURGERY     BALLOON ANGIOPLASTY, ARTERY Right 09/08/2015   superficial femoral   BREAST EXCISIONAL BIOPSY Right 2007   positive   BREAST SURGERY     CARDIOVERSION  11/10/2011   Procedure: CARDIOVERSION;  Surgeon: Lelon Perla, MD;  Location: [facility];  Service: Cardiovascular;  Laterality: N/A;   CARDIOVERSION  08/26/2010   CATARACT EXTRACTION W/PHACO Right 07/17/2017   Procedure: CATARACT EXTRACTION PHACO AND INTRAOCULAR LENS PLACEMENT ([place]);  Surgeon: Birder Robson, MD;  Location: [facility] ORS;  Service: Ophthalmology;  Laterality: Right;  [place] 00:44.0 AP% 14.6 CDE 6.44 FLUID PACK LOT # [id number]   CATARACT EXTRACTION W/PHACO Left 08/07/2017   Procedure: CATARACT EXTRACTION PHACO AND INTRAOCULAR LENS PLACEMENT (I[facility]);  Surgeon: Birder Robson, MD;  Location: [facility] ORS;  Service: Ophthalmology;  Laterality: Left;  [place] 00:44 AP% 14.1 CDE 6.32 Fluid pack lot # [id number]   COLONOSCOPY WITH PROPOFOL N/A 01/04/2016   Procedure: COLONOSCOPY WITH PROPOFOL;  Surgeon: Lollie Sails, MD;  Location: [facility] ENDOSCOPY;  Service: Endoscopy;  Laterality: N/A;   HALLUX VALGUS CORRECTION     HARDWARE REMOVAL Left 03/18/2021   Procedure: HARDWARE REMOVAL;  Surgeon: Lovell Sheehan, MD;  Location: [facility] ORS;  Service: Orthopedics;  Laterality: Left;   HIP CLOSED REDUCTION Left 01/19/2021   Procedure: CLOSED REDUCTION HIP;  Surgeon: Lovell Sheehan, MD;  Location: [facility] ORS;  Service: Orthopedics;  Laterality: Left;   KYPHOPLASTY N/A 06/25/2018   Procedure: KYPHOPLASTY L3;  Surgeon: Hessie Knows, MD;  Location: [facility] ORS;  Service: Orthopedics;  Laterality: N/A;   LUMBAR DISC SURGERY     MASTECTOMY Right 2013   positive   ORIF ELBOW FRACTURE Left 01/19/2021   Procedure: OPEN REDUCTION INTERNAL FIXATION (ORIF) ELBOW/OLECRANON FRACTURE;  Surgeon: Lovell Sheehan, MD;  Location: [facility] ORS;  Service: Orthopedics;   Laterality: Left;   PERCUTANEOUS PINNING Left 01/19/2021   Procedure: PERCUTANEOUS PINNING EXTREMITY;  Surgeon: Lovell Sheehan, MD;  Location: [facility] ORS;  Service: Orthopedics;  Laterality: Left;   PERIPHERAL VASCULAR CATHETERIZATION N/A 09/08/2015   Procedure: Abdominal Aortogram w/Lower Extremity;  Surgeon: Wellington Hampshire, MD;  Location: [place] CV LAB;  Service: Cardiovascular;  Laterality: N/A;   PERIPHERAL VASCULAR CATHETERIZATION  09/08/2015   Procedure: Peripheral Vascular Balloon Angioplasty;  Surgeon: Wellington Hampshire, MD;  Location: [place] CV LAB;  Service: Cardiovascular;;   REVISION TOTAL KNEE ARTHROPLASTY Right    rt.ankle ulcer surgery  2013   TEMPOROMANDIBULAR JOINT ARTHROPLASTY     TMJ ARTHROPLASTY     TONSILLECTOMY     TOTAL KNEE ARTHROPLASTY Right    TOTAL KNEE ARTHROPLASTY Left 02/26/2018   Procedure: TOTAL KNEE ARTHROPLASTY;  Surgeon: Corky Mull, MD;  Location: [facility] ORS;  Service: Orthopedics;  Laterality: Left;   Social History:  reports that she has never smoked. She has never used smokeless tobacco. She reports that she does not drink alcohol and does not use drugs.  Allergies  Allergen Reactions   Other Other (See Comments)    NO BP, VENIPUNCTURE, OR ACCESS IN RIGHT ARM - S/P MASTECTOMY ON RIGHT   Amlodipine Nausea And Vomiting   Ciprofloxacin Nausea Only   Doxycycline Itching and Other (See Comments)    shaky   Lipitor [Atorvastatin] Other (See Comments)    Joint pain    Morphine And Related Nausea And Vomiting   Macrobid [Nitrofurantoin Macrocrystal] Rash   Penicillins Rash and Other (See Comments)    Tolerated 1st generation cephalosporin (CEFAZOLIN) on 08/20/2013 and 01/19/2021 without documented ADRs.  PCN reaction causing immediate rash, facial/tongue/throat swelling, SOB or lightheadedness with hypotension: yes PCN reaction causing severe rash involving mucus membranes or skin necrosis: no PCN reaction that required hospitalization  no PCN reaction occurring within the last 10 years: no If all of the above answers are "NO", then may proceed with Cephalosporin use.    Sulfa Antibiotics Rash    Family History  Problem Relation Age of Onset   Heart attack Father    Coronary artery disease Sister 62       MI   Breast cancer Maternal Aunt     Prior to Admission medications   Medication Sig Start Date End Date Taking? Authorizing Provider  acetaminophen (TYLENOL) 325 MG tablet Take 650 mg by mouth every 4 (four) hours as needed (pain).  03/03/18   [provider]  amiodarone (PACERONE) 200 MG tablet Take 1 tablet (200 mg total) by mouth daily. 01/24/21   Fritzi Mandes, MD  aspirin EC 81 MG tablet Take 81 mg by mouth daily.    [provider]  budesonide (ENTOCORT EC) 3 MG 24 hr capsule Take 6 mg by mouth every morning. 09/07/20   [provider]  calcium-vitamin D (OSCAL WITH D) 500-200 MG-UNIT per tablet Take 1 tablet by mouth daily with breakfast.    [provider]  Cholecalciferol (VITAMIN D3) 5000 units CAPS Take 5,000 Units by mouth daily.     [provider]  docusate sodium (COLACE) 100 MG capsule Take 100 mg by mouth daily as needed for mild constipation. Patient not taking: Reported on 03/18/2021    [provider]  famotidine (PEPCID) 40 MG tablet Take 40 mg by mouth as needed. 09/29/20   [provider]  lactose free nutrition (BOOST) LIQD Take 237 mLs by mouth daily.    [provider]  levothyroxine (SYNTHROID) 112 MCG tablet Take 112 mcg by mouth daily before breakfast. 09/20/20   [provider]  metoprolol succinate (TOPROL-XL) 50 MG 24 hr tablet Take 25 mg by mouth daily. 09/30/20   [provider]  Multiple Vitamins-Minerals (CENTROVITE) TABS Take 1 tablet by mouth daily. 03/04/18   [provider]  PARoxetine (PAXIL) 20 MG tablet Take 1 tablet (20 mg total) by mouth daily. 03/19/18   Gerlene Fee, NP   spironolactone (ALDACTONE) 25 MG tablet Take 1 tablet (25 mg total) by mouth daily. 03/19/18   Gerlene Fee, NP    Physical Exam: Vitals:   12/19/21 0715 12/19/21 0800 12/19/21 0830 12/19/21 0923  BP:  132/80 130/66 138/85  Pulse: 96 90 83 (!) 106  Resp:  '18 16 15  '$ Temp:    97.7 F (36.5  C)  TempSrc:    Oral  SpO2: 93% 96% 96% 98%  Weight:      Height:       Physical Exam Vitals and nursing note reviewed.  Constitutional:      Appearance: Normal appearance.  HENT:     Head: Normocephalic and atraumatic.     Nose: Nose normal.     Mouth/Throat:     Mouth: Mucous membranes are moist.  Eyes:     Conjunctiva/sclera: Conjunctivae normal.  Cardiovascular:     Rate and Rhythm: Normal rate and regular rhythm.  Pulmonary:     Effort: Pulmonary effort is normal.     Breath sounds: Normal breath sounds.  Abdominal:     General: Abdomen is flat. Bowel sounds are normal.     Palpations: Abdomen is soft.  Musculoskeletal:     Cervical back: Normal range of motion and neck supple.     Comments: Decreased range of motion left knee.  Left knee immobilizer in place  Skin:    General: Skin is warm and dry.  Neurological:     Mental Status: She is alert and oriented to person, place, and time.  Psychiatric:        Mood and Affect: Mood normal.        Behavior: Behavior normal.     Data Reviewed: Relevant notes from primary care and specialist visits, past discharge summaries as available in EHR, including Care Everywhere. Prior diagnostic testing as pertinent to current admission diagnoses Updated medications and problem lists for reconciliation ED course, including vitals, labs, imaging, treatment and response to treatment Triage notes, nursing and pharmacy notes and ED provider's notes Notable results as noted in HPI Labs reviewed.  Troponin 6, sodium 139, potassium 3.8, chloride 105, bicarb 26, glucose 112, BUN 28, creatinine 1.28, calcium 9.7, total protein 7.0, albumin  4.1, AST 29, ALT 26, alkaline phosphatase 46, total bilirubin 0.7, white count 10.5, hemoglobin 13.6, hematocrit 42.5, platelet count 160 Left knee x-ray reviewed by me shows no obvious acute fracture or dislocation. Moderate suprapatellar lipohemarthrosis suspicious for an occult fracture. Total left knee arthroplasty appears intact and in anatomic alignment. CT scan of the left knee without contrast shows total knee arthroplasty with substantial hardware artifact and underlying osteopenia. Confirmed lipohemarthrosis. Subtle cortical irregularity along the distal femur (left posterolateral femoral condyle series 13, image 30 and lateral metadiaphysis series 12, image 47), suspicious for nondisplaced fracture(s). Limited detail of the patella due to artifact. No strong evidence of patella fracture on the earlier radiographs. No proximal tibia or fibula fracture identified. Recommend Orthopedic consultation. Twelve-lead EKG reviewed by me shows A-fib with a right bundle branch block. There are no new results to review at this time.  Assessment and Plan: * Fall Status post fall with left knee pain and inability to bear weight on her left lower extremity. Imaging suspicious for a possible occult fracture of the distal femur Appreciate orthopedic surgery input Patient is currently in the left knee immobilizer Pain control Touchdown weightbearing of her left lower extremity for 7 to 10 days after which she can transition to a hinged knee brace and knee motion as tolerated PT evaluation Fall precautions  Hypothyroidism due to acquired atrophy of thyroid Stable Continue Synthroid  Hypertensive heart and kidney disease with chronic diastolic congestive heart failure and stage 3 chronic kidney disease ([place]) Stable Continue metoprolol and spironolactone  UTI (urinary tract infection) Patient noted to have pyuria Prior urine culture from 10/22 yielded Citrobacter koseri  sensitive to  cephalosporins Patient is afebrile and has no leukocytosis We will hold off on antibiotic therapy for now until urine culture results unless patient develops symptoms  PAF (paroxysmal atrial fibrillation) (HCC) Continue amiodarone and metoprolol for rate control Patient not on long-term anticoagulation therapy most likely due to increased risk for falls      Advance Care Planning:   Code Status: DNR   Consults: Orthopedic surgery, PT  Family Communication: Greater than 50% of time was spent discussing patient's condition and plan of care with her at the bedside.  All questions and concerns have been addressed.  She verbalizes understanding and agrees with the plan.  Severity of Illness: The appropriate patient status for this patient is INPATIENT. Inpatient status is judged to be reasonable and necessary in order to provide the required intensity of service to ensure the patient's safety. The patient's presenting symptoms, physical exam findings, and initial radiographic and laboratory data in the context of their chronic comorbidities is felt to place them at high risk for further clinical deterioration. Furthermore, it is not anticipated that the patient will be medically stable for discharge from the hospital within 2 midnights of admission.   * I certify that at the point of admission it is my clinical judgment that the patient will require inpatient hospital care spanning beyond 2 midnights from the point of admission due to high intensity of service, high risk for further deterioration and high frequency of surveillance required.*  Author: Collier Bullock, MD 12/19/2021 9:28 AM  For on call review [email or web address].

## 2021-12-19 NOTE — Assessment & Plan Note (Addendum)
Continue metoprolol and spironolactone.  CKD stage IIIa

## 2021-12-19 NOTE — ED Notes (Signed)
Pt brought back from lobby to room 1 on a stretcher. Endorses mechanical fall x2 earlier today. No blood thinner. No head injury. No LOC. No chest/abdominal/back pain or tenderness. C/o left lower leg pain, mainly left knee. Tenderness on palpation. Dorsalis pedis pulses marked, strong and equal. Pt unable to left lower leg from bed or bend knee. Hip mobility intact. Pt daily ASA. Bruises in several stages of healing to bilateral arms and legs. Purwick placed.

## 2021-12-19 NOTE — Assessment & Plan Note (Addendum)
Continue Synthroid °

## 2021-12-19 NOTE — Evaluation (Signed)
Physical Therapy Evaluation Patient Details Name: [name] MRN: [id number] DOB: April 12, 1933 Today's Date: 12/19/2021  History of Present Illness  [name] is a 88 y.o. female with medical history significant for chronic diastolic dysfunction CHF, stage IV chronic kidney disease, right breast cancer status postlumpectomy and radiation therapy, history of collagenous colitis, depression, hypertension, hypothyroidism who was brought into the ER for evaluation following a mechanical fall and complains of left knee pain. Ortho Consult diagnosed her with distal femur fracture with stable TKA implant. She is TTWB with LLE Knee immobilizer for next 7-10 days. Then she can transition to hinged knee brace with knee ROM as tolerated;  Clinical Impression  88 yo Female s/p fall at assisted living, was diagnosed with distal femur fracture on LLE with stable TKA hardware. Ortho consult recommended patient wear a knee immobilizer for next 7-10 days with TTWB. Pt required min A for transitioned supine to sit with increased effort/time. She was able to stand at bedside with mod A using RW with cues for foot placement and hand placement for safety. She was able to maintain TTWB. Pt unable to ambulate at this time. She required min A for transfer back to bed. Pt wet from urination, called NA to assist with dressing/linen change. Patient would benefit from additional skilled PT intervention to improve strength, balance and gait safety;        Recommendations for follow up therapy are one component of a multi-disciplinary discharge planning process, led by the attending physician.  Recommendations may be updated based on patient status, additional functional criteria and insurance authorization.  Follow Up Recommendations Skilled nursing-short term rehab (<3 hours/day) Can patient physically be transported by private vehicle: No    Assistance Recommended at Discharge Frequent or constant Supervision/Assistance   Patient can return home with the following  A lot of help with walking and/or transfers;A lot of help with bathing/dressing/bathroom;Assist for transportation;Assistance with cooking/housework;Direct supervision/assist for medications management;Direct supervision/assist for financial management    Equipment Recommendations None recommended by PT  Recommendations for Other Services       Functional Status Assessment Patient has had a recent decline in their functional status and demonstrates the ability to make significant improvements in function in a reasonable and predictable amount of time.     Precautions / Restrictions Precautions Precautions: Fall Required Braces or Orthoses: Knee Immobilizer - Left Restrictions Weight Bearing Restrictions: Yes LLE Weight Bearing: Touchdown weight bearing      Mobility  Bed Mobility Overal bed mobility: Needs Assistance Bed Mobility: Supine to Sit, Sit to Supine     Supine to sit: Min assist, HOB elevated Sit to supine: Min assist, HOB elevated   General bed mobility comments: requires increased time/effort. Requires assistance for lifting LLE on/off bed but otherwise able to manage;    Transfers Overall transfer level: Needs assistance Equipment used: Rolling walker (2 wheels) Transfers: Sit to/from Stand Sit to Stand: Mod assist           General transfer comment: with cues to advance LLE forward for TTWB and cues for hand placement; Required mod A to assist with forward trunk lean    Ambulation/Gait               General Gait Details: unable to ambulate at this time  Stairs            Wheelchair Mobility    Modified Rankin (Stroke Patients Only)       Balance Overall balance assessment: Needs  assistance Sitting-balance support: Bilateral upper extremity supported Sitting balance-[name]: Fair     Standing balance support: Bilateral upper extremity supported, During functional  activity Standing balance-[name]: Poor Standing balance comment: Requires min A for balance when standing with RW with cues for increased erect posture; Pt able to maintain TTWB on LLE well;                             Pertinent Vitals/Pain Pain Assessment Pain Assessment: Faces Faces Pain Scale: Hurts whole lot Pain Location: LLE knee Pain Descriptors / Indicators: Sharp, Sore Pain Intervention(s): Limited activity within patient's tolerance, Monitored during session, Repositioned    Home Living Family/patient expects to be discharged to:: Assisted living                 Home Equipment: Conservation officer, nature (2 wheels);Rollator (4 wheels)      Prior Function Prior Level of Function : Needs assist       Physical Assist : Mobility (physical);ADLs (physical)   ADLs (physical): Bathing Mobility Comments: used rollator for most gait, limited to short distances; ADLs Comments: reports someone would help her with her showers;     Hand Dominance   Dominant Hand: Right    Extremity/Trunk Assessment   Upper Extremity Assessment Upper Extremity Assessment: Overall WFL for tasks assessed    Lower Extremity Assessment Lower Extremity Assessment: RLE deficits/detail;LLE deficits/detail RLE Deficits / Details: WFL LLE Deficits / Details: not formally assessed due to recent fracture; unable to perform SLR    Cervical / Trunk Assessment Cervical / Trunk Assessment: Kyphotic  Communication   Communication: HOH  Cognition Arousal/Alertness: Awake/alert Behavior During Therapy: WFL for tasks assessed/performed Overall Cognitive Status: Within Functional Limits for tasks assessed                                 General Comments: oriented to place, situation, person; not assessed date;        General Comments      Exercises     Assessment/Plan    PT Assessment Patient needs continued PT services  PT Problem List Decreased  strength;Decreased mobility;Decreased safety awareness;Decreased range of motion;Decreased activity tolerance;Decreased balance;Pain       PT Treatment Interventions DME instruction;Therapeutic exercise;Gait training;Balance training;Neuromuscular re-education;Functional mobility training;Therapeutic activities;Patient/family education    PT Goals (Current goals can be found in the Care Plan section)  Acute Rehab PT Goals Patient Stated Goal: to be able to walk PT Goal Formulation: With patient Time For Goal Achievement: 01/02/22 Potential to Achieve Goals: Fair    Frequency 7X/week     Co-evaluation               AM-PAC PT "6 Clicks" Mobility  Outcome Measure Help needed turning from your back to your side while in a flat bed without using bedrails?: A Little Help needed moving from lying on your back to sitting on the side of a flat bed without using bedrails?: A Little Help needed moving to and from a bed to a chair (including a wheelchair)?: A Lot Help needed standing up from a chair using your arms (e.g., wheelchair or bedside chair)?: A Lot Help needed to walk in hospital room?: Total Help needed climbing 3-5 steps with a railing? : Total 6 Click Score: 12    End of Session Equipment Utilized During Treatment: Gait belt Activity Tolerance: Patient limited  by pain Patient left: in bed;with call bell/phone within reach;with bed alarm set Nurse Communication: Mobility status PT Visit Diagnosis: Unsteadiness on feet (R26.81);Other abnormalities of gait and mobility (R26.89);Muscle weakness (generalized) (M62.81);History of falling (Z91.81);Pain Pain - Right/Left: Left Pain - part of body: Knee    Time: 6803-2122 PT Time Calculation (min) (ACUTE ONLY): 25 min   Charges:   PT Evaluation $PT Eval Low Complexity: 1 Low           Jerilee Space PT, DPT 12/19/2021, 3:18 PM

## 2021-12-19 NOTE — Assessment & Plan Note (Deleted)
Patient noted to have pyuria Prior urine culture from 10/22 yielded Citrobacter koseri sensitive to cephalosporins Patient is afebrile and has no leukocytosis We will hold off on antibiotic therapy for now until urine culture results unless patient develops symptoms

## 2021-12-19 NOTE — Consult Note (Signed)
ORTHOPAEDIC CONSULTATION  REQUESTING PHYSICIAN: No att. providers found  Chief Complaint: Left knee pain  HPI: [name] is a 88 y.o. female who complains of left knee pain after mechanical fall.  She has a history of prior left knee arthroplasty.  X-ray and CT was done in the ER and showed presence of a possible occult distal femur fracture.  Patient ambulates with a walker and lives at a local SNF.  Past medical history of A-fib on amiodarone, hypothyroid, diastolic dysfunction and CKD 3.    Past Medical History:  Diagnosis Date   (HFpEF) heart failure with preserved ejection fraction ([place])    a.) TTE 12/2015: EF 60-65%. no rwma, G2DD, triv AI, mild MR, mildly dil LA. Mild-mod TR. PASP 20mHg; b.) TTE 03/01/2018: EF 55-60%, no rwma, mild MR, mildly dil LA, nl PASP. c.) TTE 10/26/2020: EF >55%; mild RA and mod LA enlargement. Triv-mild panvalvular regurgitation.   Anemia    Arthritis    "back, hands" (09/08/2015)   Cancer of right breast ([place]   a.) RIGHT breast; s/p lumpectomy + XRT   Chronic lower back pain    CKD (chronic kidney disease), stage IV (HCC)    Collagenous colitis    Complication of anesthesia    "took me about 1 week to know what was going on after one of my knee ORs"   COPD (chronic obstructive pulmonary disease) (HCC)    Current use of long term anticoagulation    a.) ASA + dose reduced apixaban   DDD (degenerative disc disease), lumbar    Depression    Diverticulosis    Ductal carcinoma in situ (DCIS) of right breast 12/21/2011   a.) Grade II ER/PR (+) DCIS   DVT (deep venous thrombosis) ([place]   a.) LLE; post-operative.   Edema    FEET/ANKLES   GERD (gastroesophageal reflux disease)    History of sepsis    History of stress test    a. 08/2010: EF 73%, no ischemia/infarct.   Hyperlipidemia    Hypertension    Hypothyroid    Lumbar disc disease    Mixed Alzheimer's and vascular dementia ([place]    PAD (peripheral artery disease) ([place]     a. 08/2015 s/p PTA of R AT w/ drug-coated balloon angioplasty to R Popliteal; b. 05/2017 ABI: R 1.16, L 0.88-->stable.   PAF (paroxysmal atrial fibrillation) (HCC)    a.) CHA2DS2-VASc = 8 (age x 2, sex, HFpEF, HTN, DVT x2, PVD). b.) rate/rhythm maintained on amiodarone + metoprolol succinate; chronically anticoagulated with low dose apixaban + ASA. c.) s/p DCCV 08/26/2010 and 11/10/2011   PONV (postoperative nausea and vomiting)    nausea vomiting long ago with surgery but not recent ones   RBBB (right bundle branch block)    Past Surgical History:  Procedure Laterality Date   ABDOMINAL EXPLORATION SURGERY     "had to go back in after hysterectomy & check on stitch dr had put near my bladder; don't know if they took it out; had to wear catheter for 1 month"   ABDOMINAL HYSTERECTOMY     with BSO   APPENDECTOMY     BACK SURGERY     BALLOON ANGIOPLASTY, ARTERY Right 09/08/2015   superficial femoral   BREAST EXCISIONAL BIOPSY Right 2007   positive   BREAST SURGERY     CARDIOVERSION  11/10/2011   Procedure: CARDIOVERSION;  Surgeon: BLelon Perla MD;  [place] [facility]  Service: Cardiovascular;  Laterality: N/A;  CARDIOVERSION  08/26/2010   CATARACT EXTRACTION W/PHACO Right 07/17/2017   Procedure: CATARACT EXTRACTION PHACO AND INTRAOCULAR LENS PLACEMENT (IOC);  Surgeon: Birder Robson, MD;  Location: [facility];  Service: Ophthalmology;  Laterality: Right;  [place] 00:44.0 AP% 14.6 CDE 6.44 FLUID PACK LOT # [id number]   CATARACT EXTRACTION W/PHACO Left 08/07/2017   Procedure: CATARACT EXTRACTION PHACO AND INTRAOCULAR LENS PLACEMENT (IOC);  Surgeon: Birder Robson, MD;  Location: [facility];  Service: Ophthalmology;  Laterality: Left;  [place] 00:44 AP% 14.1 CDE 6.32 Fluid pack lot # [id number]   COLONOSCOPY WITH PROPOFOL N/A 01/04/2016   Procedure: COLONOSCOPY WITH PROPOFOL;  Surgeon: Lollie Sails, MD;  Location: [facility];  Service: Endoscopy;  Laterality: N/A;   HALLUX VALGUS  CORRECTION     HARDWARE REMOVAL Left 03/18/2021   Procedure: HARDWARE REMOVAL;  Surgeon: Lovell Sheehan, MD;  Location: [facility];  Service: Orthopedics;  Laterality: Left;   HIP CLOSED REDUCTION Left 01/19/2021   Procedure: CLOSED REDUCTION HIP;  Surgeon: Lovell Sheehan, MD;  Location: [facility];  Service: Orthopedics;  Laterality: Left;   KYPHOPLASTY N/A 06/25/2018   Procedure: KYPHOPLASTY L3;  Surgeon: Hessie Knows, MD;  Location: [facility];  Service: Orthopedics;  Laterality: N/A;   LUMBAR DISC SURGERY     MASTECTOMY Right 2013   positive   ORIF ELBOW FRACTURE Left 01/19/2021   Procedure: OPEN REDUCTION INTERNAL FIXATION (ORIF) ELBOW/OLECRANON FRACTURE;  Surgeon: Lovell Sheehan, MD;  Location: [facility];  Service: Orthopedics;  Laterality: Left;   PERCUTANEOUS PINNING Left 01/19/2021   Procedure: PERCUTANEOUS PINNING EXTREMITY;  Surgeon: Lovell Sheehan, MD;  Location: [facility];  Service: Orthopedics;  Laterality: Left;   PERIPHERAL VASCULAR CATHETERIZATION N/A 09/08/2015   Procedure: Abdominal Aortogram w/Lower Extremity;  Surgeon: Wellington Hampshire, MD;  Location: [facility];  Service: Cardiovascular;  Laterality: N/A;   PERIPHERAL VASCULAR CATHETERIZATION  09/08/2015   Procedure: Peripheral Vascular Balloon Angioplasty;  Surgeon: Wellington Hampshire, MD;  Location: [facility];  Service: Cardiovascular;;   REVISION TOTAL KNEE ARTHROPLASTY Right    rt.ankle ulcer surgery  2013   TEMPOROMANDIBULAR JOINT ARTHROPLASTY     TMJ ARTHROPLASTY     TONSILLECTOMY     TOTAL KNEE ARTHROPLASTY Right    TOTAL KNEE ARTHROPLASTY Left 02/26/2018   Procedure: TOTAL KNEE ARTHROPLASTY;  Surgeon: Corky Mull, MD;  Location: [facility];  Service: Orthopedics;  Laterality: Left;   Social History   Socioeconomic History   Marital status: Widowed    Spouse name: Not on file   Number of children: 2   Years of education: Not on file   Highest education level: Not on file  Occupational History    Not on file  Tobacco Use   Smoking status: Never   Smokeless tobacco: Never  Vaping Use   Vaping Use: Never used  Substance and Sexual Activity   Alcohol use: No   Drug use: No   Sexual activity: Not Currently    Birth control/protection: Surgical  Other Topics Concern   Not on file  Social History Narrative   Lives at [facility]   Social Determinants of Health   Financial Resource Strain: Not on file  Food Insecurity: Not on file  Transportation Needs: Not on file  Physical Activity: Not on file  Stress: Not on file  Social Connections: Not on file   Family History  Problem Relation Age of Onset   Heart attack Father  Coronary artery disease Sister 55       MI   Breast cancer Maternal Aunt    Allergies  Allergen Reactions   Other Other (See Comments)    NO BP, VENIPUNCTURE, OR ACCESS IN RIGHT ARM - S/P MASTECTOMY ON RIGHT   Amlodipine Nausea And Vomiting   Ciprofloxacin Nausea Only   Doxycycline Itching and Other (See Comments)    shaky   Lipitor [Atorvastatin] Other (See Comments)    Joint pain    Morphine And Related Nausea And Vomiting   Macrobid [Nitrofurantoin Macrocrystal] Rash   Penicillins Rash and Other (See Comments)    Tolerated 1st generation cephalosporin (CEFAZOLIN) on 08/20/2013 and 01/19/2021 without documented ADRs.  PCN reaction causing immediate rash, facial/tongue/throat swelling, SOB or lightheadedness with hypotension: yes PCN reaction causing severe rash involving mucus membranes or skin necrosis: no PCN reaction that required hospitalization no PCN reaction occurring within the last 10 years: no If all of the above answers are "NO", then may proceed with Cephalosporin use.    Sulfa Antibiotics Rash   Prior to Admission medications   Medication Sig Start Date End Date Taking? Authorizing Provider  acetaminophen (TYLENOL) 325 MG tablet Take 650 mg by mouth every 4 (four) hours as needed (pain).  03/03/18    [provider]  amiodarone (PACERONE) 200 MG tablet Take 1 tablet (200 mg total) by mouth daily. 01/24/21   Fritzi Mandes, MD  aspirin EC 81 MG tablet Take 81 mg by mouth daily.    [provider]  budesonide (ENTOCORT EC) 3 MG 24 hr capsule Take 6 mg by mouth every morning. 09/07/20   [provider]  calcium-vitamin D (OSCAL WITH D) 500-200 MG-UNIT per tablet Take 1 tablet by mouth daily with breakfast.    [provider]  Cholecalciferol (VITAMIN D3) 5000 units CAPS Take 5,000 Units by mouth daily.     [provider]  docusate sodium (COLACE) 100 MG capsule Take 100 mg by mouth daily as needed for mild constipation. Patient not taking: Reported on 03/18/2021    [provider]  famotidine (PEPCID) 40 MG tablet Take 40 mg by mouth as needed. 09/29/20   [provider]  lactose free nutrition (BOOST) LIQD Take 237 mLs by mouth daily.    [provider]  levothyroxine (SYNTHROID) 112 MCG tablet Take 112 mcg by mouth daily before breakfast. 09/20/20   [provider]  metoprolol succinate (TOPROL-XL) 50 MG 24 hr tablet Take 25 mg by mouth daily. 09/30/20   [provider]  Multiple Vitamins-Minerals (CENTROVITE) TABS Take 1 tablet by mouth daily. 03/04/18   [provider]  PARoxetine (PAXIL) 20 MG tablet Take 1 tablet (20 mg total) by mouth daily. 03/19/18   Gerlene Fee, NP  spironolactone (ALDACTONE) 25 MG tablet Take 1 tablet (25 mg total) by mouth daily. 03/19/18   Gerlene Fee, NP   CT Knee Left Wo Contrast  Result Date: 12/19/2021 CLINICAL DATA:  88 year old female status post fall with twisting motion. Total knee arthroplasty. Heme arthrosis suspicious for occult fracture on plain radiographs yesterday. EXAM: CT OF THE LEFT KNEE WITHOUT CONTRAST TECHNIQUE: Multidetector CT imaging of the left knee was performed according to the standard protocol. Multiplanar CT image reconstructions were also  generated. RADIATION DOSE REDUCTION: This exam was performed according to the departmental dose-optimization program which includes automated exposure control, adjustment of the mA and/or kV according to patient size and/or use of iterative reconstruction technique. COMPARISON:  Knee radiographs 12/18/2021. FINDINGS: Total knee arthroplasty with considerable hardware streak artifact despite technical attempts to limit this. Underlying pronounced osteopenia. Confirmed knee joint lipohemarthrosis (series 10, image 63) primarily in the suprapatellar joint space. No hardware failure or malalignment is evident. Detail of the patella is limited, especially the distal patella. No fracture of the proximal tibia or fibula is identified. With regard to the distal femur, there is subtle cortical irregularity along the posterior condyles, including laterally on series 13, images 30 and 31. Nondisplaced fracture there is possible. Calcified peripheral vascular disease. No extra-articular fluid collection. IMPRESSION: 1. Total knee arthroplasty with substantial hardware artifact and underlying osteopenia. 2. Confirmed lipohemarthrosis. Subtle cortical irregularity along the distal femur (left posterolateral femoral condyle series 13, image 30 and lateral metadiaphysis series 12, image 47), suspicious for nondisplaced fracture(s). Limited detail of the patella due to artifact. No strong evidence of patella fracture on the earlier radiographs. No proximal tibia or fibula fracture identified. 3. Recommend Orthopedic consultation. Electronically Signed   By: Genevie Ann M.D.   On: 12/19/2021 04:40   DG Knee Complete 4 Views Left  Result Date: 12/19/2021 CLINICAL DATA:  Fall and left knee pain. EXAM: LEFT KNEE - COMPLETE 4+ VIEW COMPARISON:  Left lower extremity radiograph dated 01/17/2021. FINDINGS: There is a total left knee arthroplasty. The arthroplasty components appear intact and in anatomic alignment. Focal area of irregularity  of the medial tibial plateau may appears chronic. No acute fracture identified. No dislocation. The bones are osteopenic. There is a moderate suprapatellar effusion with fluid fluid level consistent with lipohemarthrosis and suspicious for an occult fracture. IMPRESSION: 1. No obvious acute fracture or dislocation. 2. Moderate suprapatellar lipohemarthrosis suspicious for an occult fracture. 3. Total left knee arthroplasty appears intact and in anatomic alignment. Electronically Signed   By: Anner Crete M.D.   On: 12/19/2021 00:14    Positive ROS: All other systems have been reviewed and were otherwise negative with the exception of those mentioned in the HPI and as above.  Physical Exam: General: Alert, no acute distress Cardiovascular: No pedal edema Respiratory: No cyanosis, no use of accessory musculature GI: No organomegaly, abdomen is soft and non-tender Skin: No lesions in the area of chief complaint Neurologic: Sensation intact distally Psychiatric: Patient is competent for consent with normal mood and affect Lymphatic: No axillary or cervical lymphadenopathy  MUSCULOSKELETAL:  Left lower extremity: Knee immobilizer in place which was removed.  There is a mild effusion and tenderness to palpation about the knee.  Range of motion of the knee deferred.  Distally motor and sensory intact  Assessment: 88 year old female with history of left TKA, recent fall with imaging showing a lipohemarthrosis, likely nondisplaced occult distal femur fracture with stable TKA prosthesis  Plan: I had a long discussion with the patient regarding the nature of her injury and imaging findings.  We discussed that the total knee arthroplasty appears stable on imaging.  Recommendation is made for touchdown weightbearing of the left lower extremity and use of a knee immobilizer for 7 to 10 days, after which she can transition to a hinged knee brace and knee motion as tolerated.  Recommend outpatient follow-up  with Dr. Harlow Mares, who she has seen in 2022 for a hip fracture, in 2 weeks.    Renee Harder, MD    12/19/2021 7:36 AM

## 2021-12-20 ENCOUNTER — Inpatient Hospital Stay: Payer: Medicare HMO

## 2021-12-20 DIAGNOSIS — I480 Paroxysmal atrial fibrillation: Secondary | ICD-10-CM | POA: Diagnosis not present

## 2021-12-20 DIAGNOSIS — R4781 Slurred speech: Secondary | ICD-10-CM

## 2021-12-20 DIAGNOSIS — D696 Thrombocytopenia, unspecified: Secondary | ICD-10-CM

## 2021-12-20 DIAGNOSIS — S72402S Unspecified fracture of lower end of left femur, sequela: Secondary | ICD-10-CM | POA: Diagnosis not present

## 2021-12-20 DIAGNOSIS — N1831 Chronic kidney disease, stage 3a: Secondary | ICD-10-CM

## 2021-12-20 DIAGNOSIS — E034 Atrophy of thyroid (acquired): Secondary | ICD-10-CM | POA: Diagnosis not present

## 2021-12-20 DIAGNOSIS — S72402A Unspecified fracture of lower end of left femur, initial encounter for closed fracture: Secondary | ICD-10-CM

## 2021-12-20 LAB — CBC
HCT: 42.1 % (ref 36.0–46.0)
Hemoglobin: 14 g/dL (ref 12.0–15.0)
MCH: 33.2 pg (ref 26.0–34.0)
MCHC: 33.3 g/dL (ref 30.0–36.0)
MCV: 99.8 fL (ref 80.0–100.0)
Platelets: 142 10*3/uL — ABNORMAL LOW (ref 150–400)
RBC: 4.22 MIL/uL (ref 3.87–5.11)
RDW: 13.2 % (ref 11.5–15.5)
WBC: 10.7 10*3/uL — ABNORMAL HIGH (ref 4.0–10.5)
nRBC: 0 % (ref 0.0–0.2)

## 2021-12-20 LAB — BASIC METABOLIC PANEL
Anion gap: 10 (ref 5–15)
BUN: 18 mg/dL (ref 8–23)
CO2: 30 mmol/L (ref 22–32)
Calcium: 9.8 mg/dL (ref 8.9–10.3)
Chloride: 102 mmol/L (ref 98–111)
Creatinine, Ser: 1.08 mg/dL — ABNORMAL HIGH (ref 0.44–1.00)
GFR, Estimated: 50 mL/min — ABNORMAL LOW (ref >60)
Glucose, Bld: 113 mg/dL — ABNORMAL HIGH (ref 70–99)
Potassium: 4.1 mmol/L (ref 3.5–5.1)
Sodium: 142 mmol/L (ref 135–145)

## 2021-12-20 MED ORDER — ENOXAPARIN SODIUM 40 MG/0.4ML IJ SOSY
40.0000 mg | PREFILLED_SYRINGE | INTRAMUSCULAR | Status: DC
  Administered 2021-12-20: 40 mg via SUBCUTANEOUS
  Filled 2021-12-20: qty 0.4

## 2021-12-20 NOTE — Assessment & Plan Note (Signed)
Platelet dipped down to 142 today.  Recheck tomorrow.

## 2021-12-20 NOTE — Progress Notes (Signed)
Physical Therapy Treatment Patient Details Name: [name] MRN: [id number] DOB: 21-May-1932 Today's Date: 12/20/2021   History of Present Illness [name] is a 88 y.o. female with medical history significant for chronic diastolic dysfunction CHF, stage IV chronic kidney disease, right breast cancer status postlumpectomy and radiation therapy, history of collagenous colitis, depression, hypertension, hypothyroidism who was brought into the ER for evaluation following a mechanical fall and complains of left knee pain. Ortho Consult diagnosed her with distal femur fracture with stable TKA implant. She is TTWB with LLE Knee immobilizer for next 7-10 days. Then she can transition to hinged knee brace with knee ROM as tolerated.    PT Comments    Pt resting in bed upon PT arrival with L KI in place.  During session pt min assist with bed mobility; mod assist to stand up to RW; and min assist stand pivot bed to recliner with RW use.  Pt unable to advance R LE when attempting gait training with RW use (pt was able to maintain L LE WB'ing precautions during session with cueing).  6/10 L knee pain during session (nurse notified of pt's request for pain meds).  Will continue to focus on strengthening, balance, and progressive functional mobility during hospitalization.   Recommendations for follow up therapy are one component of a multi-disciplinary discharge planning process, led by the attending physician.  Recommendations may be updated based on patient status, additional functional criteria and insurance authorization.  Follow Up Recommendations  Skilled nursing-short term rehab (<3 hours/day) Can patient physically be transported by private vehicle: No   Assistance Recommended at Discharge Frequent or constant Supervision/Assistance  Patient can return home with the following A lot of help with walking and/or transfers;A lot of help with bathing/dressing/bathroom;Assist for transportation;Assistance  with cooking/housework;Help with stairs or ramp for entrance   Equipment Recommendations  Rolling walker (2 wheels);BSC/3in1;Wheelchair (measurements PT);Wheelchair cushion (measurements PT)    Recommendations for Other Services OT consult     Precautions / Restrictions Precautions Precautions: Fall Required Braces or Orthoses: Knee Immobilizer - Left Knee Immobilizer - Left: On at all times Restrictions Weight Bearing Restrictions: Yes LLE Weight Bearing: Touchdown weight bearing Other Position/Activity Restrictions: TTWB with L KI     Mobility  Bed Mobility Overal bed mobility: Needs Assistance Bed Mobility: Supine to Sit     Supine to sit: Min assist, HOB elevated     General bed mobility comments: assist for L LE; vc's for technique    Transfers Overall transfer level: Needs assistance Equipment used: Rolling walker (2 wheels) Transfers: Sit to/from Stand Sit to Stand: Mod assist           General transfer comment: vc's for L LE WB'ing status and R LE/B UE placement; assist to initiate stand up to RW and control descent sitting; stand pivot bed to recliner with RW use (vc's for technique and assist to steady)    Ambulation/Gait               General Gait Details: pt unable to advance R LE in standing with max cueing, assist, and RW use   Stairs             Wheelchair Mobility    Modified Rankin (Stroke Patients Only)       Balance Overall balance assessment: Needs assistance Sitting-balance support: Single extremity supported Sitting balance-[name]: Fair Sitting balance - Comments: steady static sitting   Standing balance support: Bilateral upper extremity supported, During functional activity  Standing balance-[name]: Poor Standing balance comment: assist for balance during stand pivot with RW use                            Cognition Arousal/Alertness: Awake/alert Behavior During Therapy: WFL for tasks  assessed/performed Overall Cognitive Status: Within Functional Limits for tasks assessed                                 General Comments: Oriented to person, place, situation, and month/year (but not day)        Exercises      General Comments General comments (skin integrity, edema, etc.): L LE KI in place.        Pertinent Vitals/Pain Pain Assessment Pain Assessment: 0-10 Pain Score: 6  Pain Location: LLE knee Pain Descriptors / Indicators: Aching, Tender, Sore, Discomfort Pain Intervention(s): Limited activity within patient's tolerance, Monitored during session, Repositioned, Patient requesting pain meds-RN notified Vitals (HR and O2 on room air) stable and WFL throughout treatment session.    Home Living                          Prior Function            PT Goals (current goals can now be found in the care plan section) Acute Rehab PT Goals Patient Stated Goal: to be able to walk PT Goal Formulation: With patient Time For Goal Achievement: 01/02/22 Potential to Achieve Goals: Fair Progress towards PT goals: Progressing toward goals    Frequency    7X/week      PT Plan Current plan remains appropriate    Co-evaluation              AM-PAC PT "6 Clicks" Mobility   Outcome Measure  Help needed turning from your back to your side while in a flat bed without using bedrails?: A Little Help needed moving from lying on your back to sitting on the side of a flat bed without using bedrails?: A Little Help needed moving to and from a bed to a chair (including a wheelchair)?: A Little Help needed standing up from a chair using your arms (e.g., wheelchair or bedside chair)?: A Lot Help needed to walk in hospital room?: Total Help needed climbing 3-5 steps with a railing? : Total 6 Click Score: 13    End of Session Equipment Utilized During Treatment: Gait belt Activity Tolerance: Patient tolerated treatment well Patient left: in  chair;with call bell/phone within reach;with chair alarm set;with nursing/sitter in room;Other (comment) (L LE elevated on pillow with heel floating) Nurse Communication: Mobility status;Precautions;Weight bearing status;Patient requests pain meds PT Visit Diagnosis: Unsteadiness on feet (R26.81);Other abnormalities of gait and mobility (R26.89);Muscle weakness (generalized) (M62.81);History of falling (Z91.81);Pain Pain - Right/Left: Left Pain - part of body: Knee     Time: 8563-1497 PT Time Calculation (min) (ACUTE ONLY): 27 min  Charges:  $Therapeutic Activity: 23-37 mins                     Leitha Bleak, PT 12/20/21, 1:11 PM

## 2021-12-20 NOTE — TOC Progression Note (Signed)
Transition of Care [facility]) - Progression Note    Patient Details  Name: [name] MRN: [id number] Date of Birth: 05/28/1932  [facility]) CM/SW [place], RN Phone Number: 12/20/2021, 1:59 PM  Clinical Narrative:    Spoke with the patient's daughter and she is agreeable to the patient to go to STR, SNF  PARR obtained, FL2 complete, Bedsearch sent       Expected Discharge Plan and Services                                                 Social Determinants of Health (SDOH) Interventions    Readmission Risk Interventions    12/21/2020    1:10 PM  Readmission Risk Prevention Plan  Transportation Screening Complete  PCP or Specialist Appt within 3-5 Days Complete  HRI or Home Care Consult Complete  Social Work Consult for [place] Planning/Counseling Not Complete  SW consult not completed comments Patient is assigned to RN [facility]  Palliative Care Screening Not Applicable  Medication Review [employer]) Complete

## 2021-12-20 NOTE — Hospital Course (Signed)
88 year old female with past medical history of chronic diastolic congestive heart failure, right-sided breast cancer, CKD, COPD, GERD, hypertension, hyperlipidemia, hypothyroidism, paroxysmal atrial fibrillation, Alzheimer's and vascular dementia.  She lives at assisted living and had a fall. X-ray of the left knee showed moderate suprapatellar lipohemarthrosis suspicious for acute fracture.  Patient was seen by orthopedic surgery recommended conservative management with leg immobilizer and physical therapy.  The patient had some intermittent slurred speech while I was talking with her.  CT scan of the head ordered

## 2021-12-20 NOTE — Assessment & Plan Note (Addendum)
Intermittent slurred speech was talking with her.  Son at home mentioned that this has happened in the past.  We will get a CT scan of the head.  Patient already on aspirin.

## 2021-12-20 NOTE — Assessment & Plan Note (Signed)
Follow-up orthopedics as outpatient.  Leg immobilizer and physical therapy.  Will need rehab.  Pain control.

## 2021-12-20 NOTE — Progress Notes (Signed)
Progress Note   Patient: [name] [id number]:[id number] DOB: 06-30-1932 DOA: 12/19/2021     1 DOS: the patient was seen and examined on 12/20/2021   Brief hospital course: 88 year old female with past medical history of chronic diastolic congestive heart failure, right-sided breast cancer, CKD, COPD, GERD, hypertension, hyperlipidemia, hypothyroidism, paroxysmal atrial fibrillation, Alzheimer's and vascular dementia.  She lives at assisted living and had a fall. X-ray of the left knee showed moderate suprapatellar lipohemarthrosis suspicious for acute fracture.  Patient was seen by orthopedic surgery recommended conservative management with leg immobilizer and physical therapy.  The patient had some intermittent slurred speech while I was talking with her.  CT scan of the head ordered  Assessment and Plan: Closed fracture of left distal femur [facility]) Follow-up orthopedics as outpatient.  Leg immobilizer and physical therapy.  Will need rehab.  Pain control.  Slurred speech Intermittent slurred speech was talking with her.  Son at home mentioned that this has happened in the past.  We will get a CT scan of the head.  Patient already on aspirin.  PAF (paroxysmal atrial fibrillation) (HCC) Continue amiodarone and metoprolol for rate control. Patient not on long-term anticoagulation therapy most likely due to increased risk for falls  Thrombocytopenia (HCC) Platelet dipped down to 142 today.  Recheck tomorrow.  Hypertensive heart and kidney disease with chronic diastolic congestive heart failure and stage 3 chronic kidney disease (HCC) Continue metoprolol and spironolactone.  CKD stage IIIa  Hypothyroidism due to acquired atrophy of thyroid Continue Synthroid        Subjective: Patient admitted with difficulty walking after a fall and suspected to have a distal femur fracture.  Patient also had slurred speech when I was talking to her.  The patient states that her speech is fine.  The patient's  son has noticed her speech has been an issue for some time now.  Physical Exam: Vitals:   12/19/21 1717 12/19/21 1948 12/19/21 2005 12/20/21 0836  BP: (!) 152/84 (!) 157/100 (!) 137/95 (!) 161/87  Pulse: 98 (!) 104 92 78  Resp:  16    Temp: 97.9 F (36.6 C) 98.1 F (36.7 C)  98.3 F (36.8 C)  TempSrc: Oral Oral    SpO2: 100% 99%  94%  Weight:      Height:       Physical Exam HENT:     Head: Normocephalic.     Mouth/Throat:     Pharynx: No oropharyngeal exudate.  Eyes:     General: Lids are normal.     Conjunctiva/sclera: Conjunctivae normal.  Cardiovascular:     Rate and Rhythm: Normal rate and regular rhythm.     Heart sounds: Normal heart sounds, S1 normal and S2 normal.  Pulmonary:     Breath sounds: No decreased breath sounds, wheezing, rhonchi or rales.  Abdominal:     Palpations: Abdomen is soft.     Tenderness: There is no abdominal tenderness.  Musculoskeletal:     Right lower leg: No swelling.     Left lower leg: No swelling.  Neurological:     Mental Status: She is alert.     Comments: Answers questions appropriately.  Unable to straight leg raise with the left leg.  Able to straight leg raise with right leg.  Power 5 out of 5 upper extremities bilaterally.  Intermittent slurred speech when I was talking with her.     Data Reviewed: Creatinine 1.08 with a GFR of 50, white blood cell count 10.7, hemoglobin  14, platelet count 142  Family Communication: Spoke with patient's son on the phone  Disposition: Status is: Inpatient Remains inpatient appropriate because: Physical therapy recommending rehab.  TOC to look into rehab beds.  Planned Discharge Destination: Rehab    Time spent: 28 minutes  Author: Loletha Grayer, MD 12/20/2021 2:00 PM  For on call review [email or web address].

## 2021-12-21 DIAGNOSIS — R4781 Slurred speech: Secondary | ICD-10-CM | POA: Diagnosis not present

## 2021-12-21 DIAGNOSIS — I480 Paroxysmal atrial fibrillation: Secondary | ICD-10-CM | POA: Diagnosis not present

## 2021-12-21 DIAGNOSIS — S72402S Unspecified fracture of lower end of left femur, sequela: Secondary | ICD-10-CM | POA: Diagnosis not present

## 2021-12-21 LAB — BASIC METABOLIC PANEL
Anion gap: 8 (ref 5–15)
BUN: 27 mg/dL — ABNORMAL HIGH (ref 8–23)
CO2: 28 mmol/L (ref 22–32)
Calcium: 9.3 mg/dL (ref 8.9–10.3)
Chloride: 103 mmol/L (ref 98–111)
Creatinine, Ser: 1.27 mg/dL — ABNORMAL HIGH (ref 0.44–1.00)
GFR, Estimated: 41 mL/min — ABNORMAL LOW (ref >60)
Glucose, Bld: 94 mg/dL (ref 70–99)
Potassium: 4.2 mmol/L (ref 3.5–5.1)
Sodium: 139 mmol/L (ref 135–145)

## 2021-12-21 LAB — CBC
HCT: 41.3 % (ref 36.0–46.0)
Hemoglobin: 13.6 g/dL (ref 12.0–15.0)
MCH: 32.7 pg (ref 26.0–34.0)
MCHC: 32.9 g/dL (ref 30.0–36.0)
MCV: 99.3 fL (ref 80.0–100.0)
Platelets: 151 10*3/uL (ref 150–400)
RBC: 4.16 MIL/uL (ref 3.87–5.11)
RDW: 13.1 % (ref 11.5–15.5)
WBC: 10.4 10*3/uL (ref 4.0–10.5)
nRBC: 0 % (ref 0.0–0.2)

## 2021-12-21 MED ORDER — ENOXAPARIN SODIUM 30 MG/0.3ML IJ SOSY
30.0000 mg | PREFILLED_SYRINGE | INTRAMUSCULAR | Status: DC
  Administered 2021-12-21: 30 mg via SUBCUTANEOUS
  Filled 2021-12-21: qty 0.3

## 2021-12-21 NOTE — Plan of Care (Signed)

## 2021-12-21 NOTE — Progress Notes (Signed)
  Progress Note   Patient: [name] [id number]:[id number] DOB: [birth year] DOA: 12/19/2021     2 DOS: the patient was seen and examined on 12/21/2021   Brief hospital course: 88 year old female with past medical history of chronic diastolic congestive heart failure, right-sided breast cancer, CKD, COPD, GERD, hypertension, hyperlipidemia, hypothyroidism, paroxysmal atrial fibrillation, Alzheimer's and vascular dementia.  She lives at assisted living and had a fall. X-ray of the left knee showed moderate suprapatellar lipohemarthrosis suspicious for acute fracture.  Patient was seen by orthopedic surgery recommended conservative management with leg immobilizer and physical therapy.  The patient had some intermittent slurred speech while I was talking with her.  CT scan of the head not show any evidence of acute changes.  Assessment and Plan: Closed fracture of left distal femur ([place]) No need for surgery from orthopedics.  Continue leg immobilizer and control. Continue PT/OT. Patient will need rehab.  Slurred speech Discussed with the patient and son, patient had a slurred speech for 18 months, CT scan head did not show any stroke.  No need for additional work-up this time.  PAF (paroxysmal atrial fibrillation) (HCC) Continue amiodarone and metoprolol.  Not chronically on anticoagulation.  Thrombocytopenia (HCC) Platelet count normalized again.  Hypertensive heart and kidney disease with chronic diastolic congestive heart failure and stage 3 chronic kidney disease ([place]) Conditions are stable.  Hypothyroidism due to acquired atrophy of thyroid Continue Synthroid       Subjective:  Patient doing well, still has some leg pain.  Denies any short of breath.  Physical Exam: Vitals:   12/20/21 1646 12/20/21 1928 12/21/21 0542 12/21/21 0750  BP: 127/79 136/67 122/81 133/81  Pulse: 97 100 100 96  Resp:  20 20   Temp: (!) 97.5 F (36.4 C) 97.6 F (36.4 C) 98 F (36.7 C) 98.1 F (36.7 C)   TempSrc:      SpO2: 98% 96% 96% 97%  Weight:      Height:       General exam: Appears calm and comfortable  Respiratory system: Clear to auscultation. Respiratory effort normal. Cardiovascular system: S1 & S2 heard, RRR. No JVD, murmurs, rubs, gallops or clicks. No pedal edema. Gastrointestinal system: Abdomen is nondistended, soft and nontender. No organomegaly or masses felt. Normal bowel sounds heard. Central nervous system: Alert and oriented x2. No focal neurological deficits. Extremities: Symmetric 5 x 5 power. Skin: No rashes, lesions or ulcers Psychiatry: Mood & affect appropriate.   Data Reviewed:  Imaging study and lab results reviewed.  Family Communication: Son updated over the phone.  Disposition: Status is: Inpatient Remains inpatient appropriate because: unsafe for discharge.  Planned Discharge Destination: Skilled nursing facility    Time spent: 30 minutes  Author: Sharen Hones, MD 12/21/2021 2:34 PM  For on call review [email or web address].

## 2021-12-21 NOTE — TOC Progression Note (Signed)
Transition of Care [facility]) - Progression Note    Patient Details  Name: [name] MRN: [id number] Date of Birth: 1932-09-04  Transition of [facility]) CM/SW [place], RN Phone Number: 12/21/2021, 4:01 PM  Clinical Narrative:     Met with the patient to revew bed offers, she accepted the offer from [facility], I notified Hilda Blades at [facility], [place] pending  Expected Discharge Plan: [place] Barriers to Discharge: Continued Medical Work up, SNF Pending bed offer, Insurance Authorization  Expected Discharge Plan and Services Expected Discharge Plan: [place]                                               Social Determinants of Health (SDOH) Interventions    Readmission Risk Interventions    12/21/2020    1:10 PM  Readmission Risk Prevention Plan  Transportation Screening Complete  PCP or Specialist Appt within 3-5 Days Complete  HRI or Home Care Consult Complete  Social Work Consult for [place] Planning/Counseling Not Complete  SW consult not completed comments Patient is assigned to RN [facility]  Palliative Care Screening Not Applicable  Medication Review Press photographer) Complete

## 2021-12-21 NOTE — Progress Notes (Signed)
Physical Therapy Treatment Patient Details Name: [name] MRN: [id number] DOB: [birth year] Today's Date: 12/21/2021   History of Present Illness [name] is a 88 y.o. female with medical history significant for chronic diastolic dysfunction CHF, stage IV chronic kidney disease, right breast cancer status postlumpectomy and radiation therapy, history of collagenous colitis, depression, hypertension, hypothyroidism who was brought into the ER for evaluation following a mechanical fall and complains of left knee pain. Ortho Consult diagnosed her with distal femur fracture with stable TKA implant. She is TTWB with LLE Knee immobilizer for next 7-10 days. Then she can transition to hinged knee brace with knee ROM as tolerated.    PT Comments    Pt was long sitting in bed upon arriving. She is A and O x 2. Author reoriented to pt to current situation. She mentioned several times during session wanting to return to her ALF. Author explained need for rehab prior to returning to home due to TTWB and overall current restrictions. ROM knee locked in extension throughout session. Per Ortho, wear locked in extension 7-10 days then unlock and ROM as tolerated. Pt did do a great job keeping proper wt bearing in standing but is unable to hop/transition to ambulation. Overall pt is progressing but slowly. Will need constant assistance until wt bearing status changes or pt is able to improve safety/independence with all ADLs. Recommend DC to SNF to address deficits while maximizing independence.    Recommendations for follow up therapy are one component of a multi-disciplinary discharge planning process, led by the attending physician.  Recommendations may be updated based on patient status, additional functional criteria and insurance authorization.  Follow Up Recommendations  Skilled nursing-short term rehab (<3 hours/day) Can patient physically be transported by private vehicle: No   Assistance Recommended  at Discharge Frequent or constant Supervision/Assistance  Patient can return home with the following A lot of help with walking and/or transfers;A lot of help with bathing/dressing/bathroom;Assist for transportation;Assistance with cooking/housework;Help with stairs or ramp for entrance   Equipment Recommendations  Rolling walker (2 wheels);BSC/3in1;Wheelchair (measurements PT);Wheelchair cushion (measurements PT)       Precautions / Restrictions Precautions Precautions: Fall Required Braces or Orthoses: Knee Immobilizer - Left Knee Immobilizer - Left: On at all times Restrictions Weight Bearing Restrictions: Yes LLE Weight Bearing: Touchdown weight bearing Other Position/Activity Restrictions: TTWB with L Knee brace locked in extension until day 7     Mobility  Bed Mobility Overal bed mobility: Needs Assistance Bed Mobility: Supine to Sit  Supine to sit: Min assist, HOB elevated  General bed mobility comments: Min assist to progress to EOB from long sitting.    Transfers Overall transfer level: Needs assistance Equipment used: Rolling walker (2 wheels) Transfers: Sit to/from Stand, Bed to chair/wheelchair/BSC Sit to Stand: Min assist, From elevated surface Stand pivot transfers: Min assist, Mod assist, From elevated surface    General transfer comment: pt was able to stand with min assist from elevated bed height with knee brace locked in extension + maintaining TTWB. Stood pivot to recliner from EOB with min-mod assist fo one.    Ambulation/Gait    General Gait Details: Unable due to pt's wt bearing restrictions.    Balance Overall balance assessment: Needs assistance Sitting-balance support: Single extremity supported Sitting balance-[name]: Good Sitting balance - Comments: no LOB while sitting EOB   Standing balance support: Bilateral upper extremity supported, During functional activity Standing balance-[name]: Fair Standing balance comment: pt was  reliant on BUE  support due to TTWB       Cognition Arousal/Alertness: Awake/alert Behavior During Therapy: [facility] for tasks assessed/performed Overall Cognitive Status: Within Functional Limits for tasks assessed        General Comments: pt is A and O x 2. requested to return to ALF. Explained need to go to rehab prior to returning to ALF.               Pertinent Vitals/Pain Pain Assessment Pain Assessment: No/denies pain Pain Score: 0-No pain Pain Location: LLE knee Pain Descriptors / Indicators: Aching, Tender, Sore, Discomfort Pain Intervention(s): Limited activity within patient's tolerance, Monitored during session, Premedicated before session, Repositioned     PT Goals (current goals can now be found in the care plan section) Acute Rehab PT Goals Patient Stated Goal: I want to be able to return to ALF Progress towards PT goals: Progressing toward goals    Frequency    7X/week      PT Plan Current plan remains appropriate       AM-PAC PT "6 Clicks" Mobility   Outcome Measure  Help needed turning from your back to your side while in a flat bed without using bedrails?: A Little Help needed moving from lying on your back to sitting on the side of a flat bed without using bedrails?: A Little Help needed moving to and from a bed to a chair (including a wheelchair)?: A Little Help needed standing up from a chair using your arms (e.g., wheelchair or bedside chair)?: A Little Help needed to walk in hospital room?: Total Help needed climbing 3-5 steps with a railing? : Total 6 Click Score: 14    End of Session Equipment Utilized During Treatment: Gait belt Activity Tolerance: Patient tolerated treatment well Patient left: in chair;with call bell/phone within reach;with chair alarm set;with nursing/sitter in room;Other (comment) Nurse Communication: Mobility status;Precautions;Weight bearing status;Patient requests pain meds PT Visit Diagnosis: Unsteadiness on feet  (R26.81);Other abnormalities of gait and mobility (R26.89);Muscle weakness (generalized) (M62.81);History of falling (Z91.81);Pain Pain - Right/Left: Left Pain - part of body: Knee     Time: 1050-1104 PT Time Calculation (min) (ACUTE ONLY): 14 min  Charges:  $Therapeutic Activity: 8-22 mins                    Julaine Fusi PTA 12/21/21, 12:27 PM

## 2021-12-22 DIAGNOSIS — I480 Paroxysmal atrial fibrillation: Secondary | ICD-10-CM | POA: Diagnosis not present

## 2021-12-22 DIAGNOSIS — S72402S Unspecified fracture of lower end of left femur, sequela: Secondary | ICD-10-CM | POA: Diagnosis not present

## 2021-12-22 DIAGNOSIS — R4781 Slurred speech: Secondary | ICD-10-CM | POA: Diagnosis not present

## 2021-12-22 MED ORDER — ASPIRIN 325 MG PO TBEC: 325.0000 mg | DELAYED_RELEASE_TABLET | Freq: Every day | ORAL | 0 refills | Status: AC

## 2021-12-22 MED ORDER — HYDROCODONE-ACETAMINOPHEN 5-325 MG PO TABS: 1.0000 | ORAL_TABLET | Freq: Two times a day (BID) | ORAL | 0 refills | Status: DC | PRN

## 2021-12-22 MED ORDER — HYDROCODONE-ACETAMINOPHEN 5-325 MG PO TABS: 1.0000 | ORAL_TABLET | Freq: Four times a day (QID) | ORAL | 0 refills | Status: DC | PRN

## 2021-12-22 NOTE — Care Management Important Message (Signed)
Important Message  Patient Details  Name: [name] MRN: [id number] Date of Birth: 1932/11/11   Medicare Important Message Given:  N/A - LOS <3 / Initial given by admissions     Juliann Pulse A [name] 12/22/2021, 11:31 AM

## 2021-12-22 NOTE — NC FL2 (Signed)
[place] LEVEL OF CARE SCREENING TOOL     IDENTIFICATION  Patient Name: [name] Birthdate: [birth year] Sex: female Admission Date (Current Location): 12/19/2021  [facility] and [place] Number:  Engineering geologist and Address:  [facility], [address], [place], [place] [address]      Provider Number: [id number]  Attending Physician Name and Address:  Sharen Hones, MD  Relative Name and Phone Number:  Jodie Daughter    Current Level of Care: Hospital Recommended Level of Care: [place] Prior Approval Number:    Date Approved/Denied:   PASRR Number: [id number]  Discharge Plan: SNF    Current Diagnoses: Patient Active Problem List   Diagnosis Date Noted   Closed fracture of left distal femur ([place]) 12/20/2021   Slurred speech 12/20/2021   Thrombocytopenia ([place]) 12/20/2021   Closed left hip fracture ([place]) 01/17/2021   Left elbow fracture 01/17/2021   Syncope and collapse 12/20/2020   Acute deep vein thrombosis (DVT) of left peroneal vein ([place]) 03/06/2018   Arterial leg ulcer ([place]) 03/06/2018   Hypertensive heart and kidney disease with chronic diastolic congestive heart failure and stage 3 chronic kidney disease ([place]) 03/06/2018   GERD without esophagitis 03/06/2018   CKD (chronic kidney disease) stage 3, GFR 30-59 ml/min (HCC) 03/06/2018   Anemia 03/06/2018   Hypothyroidism due to acquired atrophy of thyroid 03/06/2018   Dyslipidemia 03/06/2018   Primary osteoarthritis of left knee 03/06/2018   Status post total knee replacement using cement, left 02/26/2018   Orthostatic hypotension 01/14/2016   ARF (acute renal failure) ([place]) 01/14/2016   Dehydration 01/14/2016   Protein-calorie malnutrition, severe 01/12/2016   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   PAD (peripheral artery disease) ([place]) 08/10/2014   Chronic diastolic congestive heart failure ([place]) 02/26/2014   Renal insufficiency  09/14/2011   PAF (paroxysmal atrial fibrillation) ([place]) 08/09/2010   Hypertension 08/09/2010    Orientation RESPIRATION BLADDER Height & Weight     Self, Time, Situation, Place  Normal Continent Weight: 59 kg Height:  '5\' 8"'$  (172.7 cm)  BEHAVIORAL SYMPTOMS/MOOD NEUROLOGICAL BOWEL NUTRITION STATUS      Continent Diet (see dc summary)  AMBULATORY STATUS COMMUNICATION OF NEEDS Skin   Extensive Assist Verbally Normal                       Personal Care Assistance Level of Assistance  Bathing, Feeding, Dressing Bathing Assistance: Limited assistance Feeding assistance: Independent Dressing Assistance: Maximum assistance     Functional Limitations Info             SPECIAL CARE FACTORS FREQUENCY  PT (By licensed PT), OT (By licensed OT)     PT Frequency: 5 times per week OT Frequency: 5 times per week            Contractures Contractures Info: Not present    Additional Factors Info  Code Status, Allergies Code Status Info: DNR Allergies Info: Other, Amlodipine, Ciprofloxacin, Doxycycline, Lipitor (Atorvastatin), Morphine And Related, Macrobid (Nitrofurantoin Macrocrystal), Penicillins, Sulfa Antibiotics           Current Medications (12/22/2021):  This is the current hospital active medication list Current Facility-Administered Medications  Medication Dose Route Frequency Provider Last Rate Last Admin   0.9 %  sodium chloride infusion  250 mL Intravenous PRN Agbata, Tochukwu, MD       acetaminophen (TYLENOL) tablet 650 mg  650 mg Oral Q4H PRN Collier Bullock, MD  650 mg at 12/21/21 2258   amiodarone (PACERONE) tablet 100 mg  100 mg Oral Daily Agbata, Tochukwu, MD   100 mg at 12/22/21 7121   aspirin EC tablet 81 mg  81 mg Oral Daily Agbata, Tochukwu, MD   81 mg at 12/22/21 0927   budesonide (ENTOCORT EC) 24 hr capsule 6 mg  6 mg Oral q morning Agbata, Tochukwu, MD   6 mg at 12/22/21 9758   calcium-vitamin D (OSCAL WITH D) 500-5 MG-MCG per tablet 1 tablet  1  tablet Oral Q breakfast Agbata, Tochukwu, MD   1 tablet at 12/22/21 0811   cholecalciferol (VITAMIN D3) 25 MCG (1000 UNIT) tablet 5,000 Units  5,000 Units Oral Daily Agbata, Tochukwu, MD   5,000 Units at 12/22/21 0927   enoxaparin (LOVENOX) injection 30 mg  30 mg Subcutaneous Q24H Dorothe Pea, RPH   30 mg at 12/21/21 2254   levothyroxine (SYNTHROID) tablet 125 mcg  125 mcg Oral Q0600 Agbata, Tochukwu, MD   125 mcg at 12/22/21 0513   metoprolol succinate (TOPROL-XL) 24 hr tablet 25 mg  25 mg Oral Daily Agbata, Tochukwu, MD   25 mg at 12/22/21 8325   multivitamin with minerals tablet 1 tablet  1 tablet Oral Daily Agbata, Tochukwu, MD   1 tablet at 12/22/21 0927   ondansetron (ZOFRAN) tablet 4 mg  4 mg Oral Q6H PRN Agbata, Tochukwu, MD       Or   ondansetron (ZOFRAN) injection 4 mg  4 mg Intravenous Q6H PRN Agbata, Tochukwu, MD       sertraline (ZOLOFT) tablet 100 mg  100 mg Oral Daily Lu Duffel, RPH   100 mg at 12/22/21 4982   sodium chloride flush (NS) 0.9 % injection 3 mL  3 mL Intravenous Q12H Agbata, Tochukwu, MD   3 mL at 12/22/21 0928   sodium chloride flush (NS) 0.9 % injection 3 mL  3 mL Intravenous PRN Agbata, Tochukwu, MD       spironolactone (ALDACTONE) tablet 25 mg  25 mg Oral Daily Agbata, Tochukwu, MD   25 mg at 12/22/21 0927   traMADol (ULTRAM) tablet 50 mg  50 mg Oral Q8H PRN Agbata, Tochukwu, MD   50 mg at 12/21/21 2258     Discharge Medications: Please see discharge summary for a list of discharge medications.  Relevant Imaging Results:  Relevant Lab Results:   Additional Information [id number]  Conception Oms, RN

## 2021-12-22 NOTE — TOC Progression Note (Signed)
Transition of Care [facility]) - Progression Note    Patient Details  Name: [name] MRN: [id number] Date of Birth: 14-Jul-1932  [facility] CM/SW [place], RN Phone Number: 12/22/2021, 9:45 AM  Clinical Narrative:    Spoke to the patient's daughter Speaker, I explained that there are 3 bed offers, and explained that when I reviewed the bed offers with the patient, she chose [facility], Ins approved [id number] cert number 7/0-1/41   Expected Discharge Plan: [place] Barriers to Discharge: Continued Medical Work up, SNF Pending bed offer, Ship broker  Expected Discharge Plan and Services Expected Discharge Plan: [place]                                               Social Determinants of Health (SDOH) Interventions    Readmission Risk Interventions    12/21/2020    1:10 PM  Readmission Risk Prevention Plan  Transportation Screening Complete  PCP or Specialist Appt within 3-5 Days Complete  HRI or Home Care Consult Complete  Social Work Consult for [place] Planning/Counseling Not Complete  SW consult not completed comments Patient is assigned to RN [facility]  Palliative Care Screening Not Applicable  Medication Review Press photographer) Complete

## 2021-12-22 NOTE — TOC Progression Note (Signed)
Transition of Care [facility]) - Progression Note    Patient Details  Name: [name] MRN: [id number] Date of Birth: 02/06/1933  Transition of Care [facility]) CM/SW [place], RN Phone Number: 12/22/2021, 11:20 AM  Clinical Narrative:    EMS called and inquired about a time of arrival [facility] She is number 4 on list   Expected Discharge Plan: [place] Barriers to Discharge: Continued Medical Work up, SNF Pending bed offer, Insurance Authorization  Expected Discharge Plan and Services Expected Discharge Plan: [place]         Expected Discharge Date: 12/22/21                                     Social Determinants of Health (SDOH) Interventions    Readmission Risk Interventions    12/21/2020    1:10 PM  Readmission Risk Prevention Plan  Transportation Screening Complete  PCP or Specialist Appt within 3-5 Days Complete  HRI or [place] Complete  Social Work Consult for [place] Planning/Counseling Not Complete  SW consult not completed comments Patient is assigned to RN [facility]  Palliative Care Screening Not Applicable  Medication Review Press photographer) Complete

## 2021-12-22 NOTE — Discharge Summary (Signed)
Physician Discharge Summary   Patient: [name] MRN: [id number] DOB: 1932/10/16  Admit date:     12/19/2021  Discharge date: 12/22/21  Discharge Physician: Sharen Hones   PCP: Rusty Aus, MD   Recommendations at discharge:   Take aspirin 325 mg daily x14 days.  Then changed to 81 mg daily. Follow-up with also [facility] in 2 weeks. Knee  immobilizer until seen by orthopedics.  Discharge Diagnoses: Active Problems:   Closed fracture of left distal femur (HCC)   Slurred speech   PAF (paroxysmal atrial fibrillation) (HCC)   Thrombocytopenia (HCC)   Hypertensive heart and kidney disease with chronic diastolic congestive heart failure and stage 3 chronic kidney disease ([place])   Hypothyroidism due to acquired atrophy of thyroid  Resolved Problems:   * No resolved hospital problems. *  Hospital Course: 88 year old female with past medical history of chronic diastolic congestive heart failure, right-sided breast cancer, CKD, COPD, GERD, hypertension, hyperlipidemia, hypothyroidism, paroxysmal atrial fibrillation, Alzheimer's and vascular dementia.  She lives at assisted living and had a fall. X-ray of the left knee showed moderate suprapatellar lipohemarthrosis suspicious for acute fracture.  Patient was seen by orthopedic surgery recommended conservative management with leg immobilizer and physical therapy.  The patient had some intermittent slurred speech while I was talking with her.  CT scan of the head not show any evidence of acute changes.  Assessment and Plan: Closed fracture of left distal femur ([place]) No need for surgery from orthopedics.  Continue leg immobilizer and pain control. Patient be returned to nursing home today.  He will be followed by nursing home provider as well as orthopedics.   Slurred speech Discussed with the patient and son, patient had a slurred speech for 18 months, CT scan head did not show any stroke.  No need for additional work-up this time.   PAF  (paroxysmal atrial fibrillation) (HCC) Continue amiodarone and metoprolol.  Not chronically on anticoagulation.   Thrombocytopenia (HCC) Platelet count normalized   Hypertensive heart and kidney disease with chronic diastolic congestive heart failure and stage 3 chronic kidney disease ([place]) Conditions are stable.   Hypothyroidism due to acquired atrophy of thyroid Continue Synthroid       Consultants: Orthopedics Procedures performed: None  Disposition: Skilled nursing facility Diet recommendation:  Discharge Diet Orders (From admission, onward)     Start     Ordered   12/22/21 0000  Diet - low sodium heart healthy        12/22/21 0955           Cardiac diet DISCHARGE MEDICATION: Allergies as of 12/22/2021       Reactions   Other Other (See Comments)   NO BP, VENIPUNCTURE, OR ACCESS IN RIGHT ARM - S/P MASTECTOMY ON RIGHT   Amlodipine Nausea And Vomiting   Ciprofloxacin Nausea Only   Doxycycline Itching, Other (See Comments)   shaky   Lipitor [atorvastatin] Other (See Comments)   Joint pain   Morphine And Related Nausea And Vomiting   Macrobid [nitrofurantoin Macrocrystal] Rash   Penicillins Rash, Other (See Comments)   Tolerated 1st generation cephalosporin (CEFAZOLIN) on 08/20/2013 and 01/19/2021 without documented ADRs. PCN reaction causing immediate rash, facial/tongue/throat swelling, SOB or lightheadedness with hypotension: yes PCN reaction causing severe rash involving mucus membranes or skin necrosis: no PCN reaction that required hospitalization no PCN reaction occurring within the last 10 years: no If all of the above answers are "NO", then may proceed with Cephalosporin use.   Sulfa Antibiotics  Rash        Medication List     STOP taking these medications    docusate sodium 100 MG capsule Commonly known as: COLACE   famotidine 40 MG tablet Commonly known as: PEPCID   lactose free nutrition Liqd   PARoxetine 20 MG tablet Commonly known  as: PAXIL       TAKE these medications    acetaminophen 325 MG tablet Commonly known as: TYLENOL Take 650 mg by mouth every 4 (four) hours as needed (pain). What changed: Another medication with the same name was removed. Continue taking this medication, and follow the directions you see here.   amiodarone 200 MG tablet Commonly known as: PACERONE Take 1 tablet (200 mg total) by mouth daily.   aspirin EC 325 MG tablet Take 1 tablet (325 mg total) by mouth daily for 14 days. What changed:  medication strength how much to take   budesonide 3 MG 24 hr capsule Commonly known as: ENTOCORT EC Take 6 mg by mouth every morning.   busPIRone 10 MG tablet Commonly known as: BUSPAR Take 10 mg by mouth 2 (two) times daily.   calcium carbonate 750 MG chewable tablet Commonly known as: TUMS EX Chew 2 tablets by mouth every 6 (six) hours as needed.   calcium-vitamin D 500-200 MG-UNIT tablet Commonly known as: OSCAL WITH D Take 1 tablet by mouth daily with breakfast.   CENTROVITE Tabs Take 1 tablet by mouth daily.   cetirizine 5 MG tablet Commonly known as: ZYRTEC Take 5 mg by mouth daily.   fluticasone 50 MCG/ACT nasal spray Commonly known as: FLONASE Place 1 spray into both nostrils daily as needed.   HYDROcodone-acetaminophen 5-325 MG tablet Commonly known as: NORCO/VICODIN Take 1 tablet by mouth every 12 (twelve) hours as needed.   levothyroxine 125 MCG tablet Commonly known as: SYNTHROID Take 125 mcg by mouth daily with breakfast. What changed: Another medication with the same name was removed. Continue taking this medication, and follow the directions you see here.   metoprolol succinate 50 MG 24 hr tablet Commonly known as: TOPROL-XL Take 25 mg by mouth daily.   sertraline 100 MG tablet Commonly known as: ZOLOFT Take 100 mg by mouth daily.   spironolactone 25 MG tablet Commonly known as: ALDACTONE Take 1 tablet (25 mg total) by mouth daily.   TGT Psyllium  Fiber 0.52 g capsule Generic drug: psyllium Take 1 capsule by mouth 2 (two) times daily.   Vitamin D3 50 MCG (2000 UT) Tabs Take 2,000 Units by mouth daily. What changed: Another medication with the same name was removed. Continue taking this medication, and follow the directions you see here.        Contact information for follow-up providers     Lovell Sheehan, MD Follow up in 2 week(s).   Specialty: Orthopedic Surgery Contact information: [place] [address] [phone]              Contact information for after-discharge care     Destination     [facility] .   Service: Skilled Nursing Contact information: [address] [place] [phone]                    Discharge Exam: Danley Danker Weights   12/18/21 2315  Weight: 59 kg   General exam: Appears calm and comfortable  Respiratory system: Clear to auscultation. Respiratory effort normal. Cardiovascular system: S1 & S2  heard, RRR. No JVD, murmurs, rubs, gallops or clicks. No pedal edema. Gastrointestinal system: Abdomen is nondistended, soft and nontender. No organomegaly or masses felt. Normal bowel sounds heard. Central nervous system: Alert and oriented. No focal neurological deficits. Extremities: Symmetric 5 x 5 power. Skin: No rashes, lesions or ulcers Psychiatry: Judgement and insight appear normal. Mood & affect appropriate.    Condition at discharge: good  The results of significant diagnostics from this hospitalization (including imaging, microbiology, ancillary and laboratory) are listed below for reference.   Imaging Studies: CT HEAD WO CONTRAST (5MM)  Result Date: 12/20/2021 CLINICAL DATA:  Intermittent slurred speech, stroke suspected EXAM: CT HEAD WITHOUT CONTRAST TECHNIQUE: Contiguous axial images were obtained from the base of the skull through the vertex without intravenous contrast. RADIATION DOSE  REDUCTION: This exam was performed according to the departmental dose-optimization program which includes automated exposure control, adjustment of the mA and/or kV according to patient size and/or use of iterative reconstruction technique. COMPARISON:  01/29/2021 FINDINGS: Brain: No evidence of acute infarction, hemorrhage, mass, mass effect, or midline shift. No hydrocephalus or extra-axial fluid collection. Periventricular white matter changes, likely the sequela of chronic small vessel ischemic disease. Cerebral atrophy is likely within normal limits for age and appears similar to the prior exam. Vascular: No hyperdense vessel. Skull: Normal. Negative for fracture or focal lesion. Sinuses/Orbits: No acute finding. Status post bilateral lens replacements. Other: The mastoid air cells are well aerated. IMPRESSION: No acute intracranial process. Electronically Signed   By: Merilyn Baba M.D.   On: 12/20/2021 20:21   CT Knee Left Wo Contrast  Result Date: 12/19/2021 CLINICAL DATA:  88 year old female status post fall with twisting motion. Total knee arthroplasty. Heme arthrosis suspicious for occult fracture on plain radiographs yesterday. EXAM: CT OF THE LEFT KNEE WITHOUT CONTRAST TECHNIQUE: Multidetector CT imaging of the left knee was performed according to the standard protocol. Multiplanar CT image reconstructions were also generated. RADIATION DOSE REDUCTION: This exam was performed according to the departmental dose-optimization program which includes automated exposure control, adjustment of the mA and/or kV according to patient size and/or use of iterative reconstruction technique. COMPARISON:  Knee radiographs 12/18/2021. FINDINGS: Total knee arthroplasty with considerable hardware streak artifact despite technical attempts to limit this. Underlying pronounced osteopenia. Confirmed knee joint lipohemarthrosis (series 10, image 63) primarily in the suprapatellar joint space. No hardware failure or  malalignment is evident. Detail of the patella is limited, especially the distal patella. No fracture of the proximal tibia or fibula is identified. With regard to the distal femur, there is subtle cortical irregularity along the posterior condyles, including laterally on series 13, images 30 and 31. Nondisplaced fracture there is possible. Calcified peripheral vascular disease. No extra-articular fluid collection. IMPRESSION: 1. Total knee arthroplasty with substantial hardware artifact and underlying osteopenia. 2. Confirmed lipohemarthrosis. Subtle cortical irregularity along the distal femur (left posterolateral femoral condyle series 13, image 30 and lateral metadiaphysis series 12, image 47), suspicious for nondisplaced fracture(s). Limited detail of the patella due to artifact. No strong evidence of patella fracture on the earlier radiographs. No proximal tibia or fibula fracture identified. 3. Recommend Orthopedic consultation. Electronically Signed   By: Genevie Ann M.D.   On: 12/19/2021 04:40   DG Knee Complete 4 Views Left  Result Date: 12/19/2021 CLINICAL DATA:  Fall and left knee pain. EXAM: LEFT KNEE - COMPLETE 4+ VIEW COMPARISON:  Left lower extremity radiograph dated 01/17/2021. FINDINGS: There is a total left knee arthroplasty. The arthroplasty components appear intact  and in anatomic alignment. Focal area of irregularity of the medial tibial plateau may appears chronic. No acute fracture identified. No dislocation. The bones are osteopenic. There is a moderate suprapatellar effusion with fluid fluid level consistent with lipohemarthrosis and suspicious for an occult fracture. IMPRESSION: 1. No obvious acute fracture or dislocation. 2. Moderate suprapatellar lipohemarthrosis suspicious for an occult fracture. 3. Total left knee arthroplasty appears intact and in anatomic alignment. Electronically Signed   By: Anner Crete M.D.   On: 12/19/2021 00:14    Microbiology: Results for orders placed  or performed during the hospital encounter of 01/29/21  Urine Culture     Status: Abnormal   Collection Time: 01/29/21  4:04 AM   Specimen: Urine, Random  Result Value Ref Range Status   Specimen Description   Final    URINE, RANDOM Performed at [facility], [address] [place], [place] [address]    Special Requests   Final    NONE Performed at [facility], [address] [place], [place] [address]    Culture (A)  Final    >=100,000 COLONIES/mL CITROBACTER KOSERI 50,000 COLONIES/mL CITROBACTER BRAAKII    Report Status 02/01/2021 FINAL  Final   Organism ID, Bacteria CITROBACTER KOSERI (A)  Final   Organism ID, Bacteria CITROBACTER BRAAKII (A)  Final      Susceptibility   Citrobacter braakii - MIC*    CEFAZOLIN >=64 RESISTANT Resistant     CEFEPIME <=0.12 SENSITIVE Sensitive     CEFTRIAXONE >=64 RESISTANT Resistant     CIPROFLOXACIN <=0.25 SENSITIVE Sensitive     GENTAMICIN <=1 SENSITIVE Sensitive     IMIPENEM <=0.25 SENSITIVE Sensitive     NITROFURANTOIN <=16 SENSITIVE Sensitive     TRIMETH/SULFA <=20 SENSITIVE Sensitive     PIP/TAZO 16 SENSITIVE Sensitive     * 50,000 COLONIES/mL CITROBACTER BRAAKII   Citrobacter koseri - MIC*    CEFAZOLIN <=4 SENSITIVE Sensitive     CEFEPIME <=0.12 SENSITIVE Sensitive     CEFTRIAXONE <=0.25 SENSITIVE Sensitive     CIPROFLOXACIN <=0.25 SENSITIVE Sensitive     GENTAMICIN 8 INTERMEDIATE Intermediate     IMIPENEM <=0.25 SENSITIVE Sensitive     NITROFURANTOIN <=16 SENSITIVE Sensitive     TRIMETH/SULFA <=20 SENSITIVE Sensitive     PIP/TAZO 8 SENSITIVE Sensitive     * >=100,000 COLONIES/mL CITROBACTER KOSERI    Labs: CBC: Recent Labs  Lab 12/18/21 2319 12/20/21 0619 12/21/21 0628  WBC 10.5 10.7* 10.4  HGB 13.6 14.0 13.6  HCT 42.5 42.1 41.3  MCV 101.7* 99.8 99.3  PLT 169 142* 443   Basic Metabolic Panel: Recent Labs  Lab 12/18/21 2319 12/20/21 0619 12/21/21 0628  NA 139 142 139  K 3.8 4.1 4.2  CL  105 102 103  CO2 '26 30 28  '$ GLUCOSE 112* 113* 94  BUN 28* 18 27*  CREATININE 1.28* 1.08* 1.27*  CALCIUM 9.7 9.8 9.3   Liver Function Tests: Recent Labs  Lab 12/18/21 2319  AST 29  ALT 26  ALKPHOS 46  BILITOT 0.7  PROT 7.0  ALBUMIN 4.1   CBG: No results for input(s): "GLUCAP" in the last 168 hours.  Discharge time spent: greater than 30 minutes.  Signed: Sharen Hones, MD [facility] 12/22/2021

## 2021-12-22 NOTE — Plan of Care (Signed)

## 2021-12-22 NOTE — TOC Progression Note (Signed)
Transition of Care [facility]) - Progression Note    Patient Details  Name: [name] MRN: [id number] Date of Birth: January 20, 1933  Transition of Care [facility]) CM/SW Gray, RN Phone Number: 12/22/2021, 11:50 AM  Clinical Narrative:    Called and spoke to Remo Lipps the patient's son I explained to him that she is going to Room 305 at [facility] He will see her this evening also spoke with Peggye Fothergill the patient's daughter and let her know   Expected Discharge Plan: Skilled Nursing Facility Barriers to Discharge: Continued Medical Work up, SNF Pending bed offer, Insurance Authorization  Expected Discharge Plan and Services Expected Discharge Plan: [place]         Expected Discharge Date: 12/22/21                                     Social Determinants of Health (SDOH) Interventions    Readmission Risk Interventions    12/21/2020    1:10 PM  Readmission Risk Prevention Plan  Transportation Screening Complete  PCP or Specialist Appt within 3-5 Days Complete  HRI or Home Care Consult Complete  Social Work Consult for [place] Planning/Counseling Not Complete  SW consult not completed comments Patient is assigned to RN [facility]  Palliative Care Screening Not Applicable  Medication Review Press photographer) Complete

## 2021-12-22 NOTE — Progress Notes (Signed)
Physical Therapy Treatment Patient Details Name: [name] MRN: [id number] DOB: Sep 12, 1932 Today's Date: 12/22/2021   History of Present Illness [name] is a 88 y.o. female with medical history significant for chronic diastolic dysfunction CHF, stage IV chronic kidney disease, right breast cancer status postlumpectomy and radiation therapy, history of collagenous colitis, depression, hypertension, hypothyroidism who was brought into the ER for evaluation following a mechanical fall and complains of left knee pain. Ortho Consult diagnosed her with distal femur fracture with stable TKA implant. She is TTWB with LLE Knee immobilizer for next 7-10 days. Then she can transition to hinged knee brace with knee ROM as tolerated.    PT Comments    Pt was asleep upon entry. She easily awakes and agrees to session. A and O x 3 however presents pleasantly confused at times. HOH noted. Was aware that she was in hospital and that she hurt her leg. She endorses " a little" pain during movements. Overall pt continues to progress but is limited by her wt bearing restrictions. She does well maintaining TTWB with RW + constant vcs + assistance. Will greatly benefit from continued skilled PT at DC to address deficits while maximizing independence with ADLs. Pt is currently unable to advance to gait training due to wt bearing restrictions. Will benefit from W/C, RW, and BSC at DC + continued skilled PT to progress pt with overall safety during ADLs.    Recommendations for follow up therapy are one component of a multi-disciplinary discharge planning process, led by the attending physician.  Recommendations may be updated based on patient status, additional functional criteria and insurance authorization.  Follow Up Recommendations  Skilled nursing-short term rehab (<3 hours/day) Can patient physically be transported by private vehicle: No   Assistance Recommended at Discharge Frequent or constant  Supervision/Assistance  Patient can return home with the following A lot of help with walking and/or transfers;A lot of help with bathing/dressing/bathroom;Assist for transportation;Assistance with cooking/housework;Help with stairs or ramp for entrance   Equipment Recommendations  Rolling walker (2 wheels);BSC/3in1;Wheelchair (measurements PT);Wheelchair cushion (measurements PT)       Precautions / Restrictions Precautions Precautions: Fall Required Braces or Orthoses: Other Brace (hindged knee brace locked in extension) Knee Immobilizer - Left: On at all times Restrictions Weight Bearing Restrictions: Yes LLE Weight Bearing: Touchdown weight bearing Other Position/Activity Restrictions: TTWB with L Knee brace locked in extension until day 7     Mobility  Bed Mobility Overal bed mobility: Needs Assistance Bed Mobility: Supine to Sit     Supine to sit: Min assist, HOB elevated (increased time to perform)     General bed mobility comments: pt was able to exit R sid eof bed with increased time + min assist. Vcs for technique and sequencing    Transfers Overall transfer level: Needs assistance Equipment used: Rolling walker (2 wheels) Transfers: Bed to chair/wheelchair/BSC, Sit to/from Stand Sit to Stand: Min assist, From elevated surface Stand pivot transfers: Min assist, From elevated surface    General transfer comment: bed > recliner    Ambulation/Gait      General Gait Details: unable to ambulate with current wt bearing restrictions    Balance Overall balance assessment: Needs assistance Sitting-balance support: Single extremity supported, Bilateral upper extremity supported Sitting balance-[name]: Good     Standing balance support: Bilateral upper extremity supported, During functional activity Standing balance-[name]: Fair Standing balance comment: pt was reliant on BUE support due to TTWB  Cognition Arousal/Alertness: Awake/alert Behavior  During Therapy: WFL for tasks assessed/performed Overall Cognitive Status: Within Functional Limits for tasks assessed        General Comments: pt is A and O x 3 this morning. need reminders of wt bearing restrictions however does well maintaining           General Comments General comments (skin integrity, edema, etc.): Author had pt perform several exercises while seated EOB and then once in recliner. she tolerated well but does c/o increased pain during.      Pertinent Vitals/Pain Pain Assessment Pain Assessment: 0-10 Pain Score: 3  Faces Pain Scale: Hurts a little bit Pain Location: LLE knee/thigh Pain Descriptors / Indicators: Aching, Tender, Sore, Discomfort Pain Intervention(s): Limited activity within patient's tolerance, Monitored during session, Premedicated before session, Repositioned     PT Goals (current goals can now be found in the care plan section) Acute Rehab PT Goals Patient Stated Goal: get better so I can return to my home (ALF) Progress towards PT goals: Progressing toward goals    Frequency    7X/week      PT Plan Current plan remains appropriate       AM-PAC PT "6 Clicks" Mobility   Outcome Measure  Help needed turning from your back to your side while in a flat bed without using bedrails?: A Little Help needed moving from lying on your back to sitting on the side of a flat bed without using bedrails?: A Little Help needed moving to and from a bed to a chair (including a wheelchair)?: A Little Help needed standing up from a chair using your arms (e.g., wheelchair or bedside chair)?: A Little Help needed to walk in hospital room?: Total Help needed climbing 3-5 steps with a railing? : Total 6 Click Score: 14    End of Session   Activity Tolerance: Patient tolerated treatment well Patient left: in chair;with call bell/phone within reach;with chair alarm set;with nursing/sitter in room;Other (comment) Nurse Communication: Mobility  status;Precautions;Weight bearing status;Patient requests pain meds PT Visit Diagnosis: Unsteadiness on feet (R26.81);Other abnormalities of gait and mobility (R26.89);Muscle weakness (generalized) (M62.81);History of falling (Z91.81);Pain Pain - Right/Left: Left Pain - part of body: Knee     Time: 6283-6629 PT Time Calculation (min) (ACUTE ONLY): 23 min  Charges:  $Therapeutic Exercise: 8-22 mins $Therapeutic Activity: 8-22 mins                     Julaine Fusi PTA 12/22/21, 9:33 AM

## 2021-12-22 NOTE — TOC Progression Note (Signed)
Transition of Care [facility]) - Progression Note    Patient Details  Name: [name] MRN: [id number] Date of Birth: [birth year]  Transition of Care [facility]) CM/SW Roland, RN Phone Number: 12/22/2021, 10:56 AM  Clinical Narrative:     The patient is going to room 305 to [facility]  Expected Discharge Plan: [place] Barriers to Discharge: Continued Medical Work up, SNF Pending bed offer, Insurance Authorization  Expected Discharge Plan and Services Expected Discharge Plan: [place]         Expected Discharge Date: 12/22/21                                     Social Determinants of Health (SDOH) Interventions    Readmission Risk Interventions    12/21/2020    1:10 PM  Readmission Risk Prevention Plan  Transportation Screening Complete  PCP or Specialist Appt within 3-5 Days Complete  HRI or [place] Complete  Social Work Consult for [place] Planning/Counseling Not Complete  SW consult not completed comments Patient is assigned to RN [facility]  Palliative Care Screening Not Applicable  Medication Review Press photographer) Complete

## 2022-08-22 ENCOUNTER — Emergency Department: Payer: Medicare HMO

## 2022-08-22 ENCOUNTER — Emergency Department
Admission: EM | Admit: 2022-08-22 | Discharge: 2022-08-22 | Disposition: A | Discharge status: Skilled Nursing Facility | Payer: Medicare HMO | Attending: Emergency Medicine | Admitting: Emergency Medicine

## 2022-08-22 DIAGNOSIS — J189 Pneumonia, unspecified organism: Secondary | ICD-10-CM

## 2022-08-22 DIAGNOSIS — Z853 Personal history of malignant neoplasm of breast: Secondary | ICD-10-CM | POA: Diagnosis not present

## 2022-08-22 DIAGNOSIS — R079 Chest pain, unspecified: Secondary | ICD-10-CM

## 2022-08-22 DIAGNOSIS — I480 Paroxysmal atrial fibrillation: Secondary | ICD-10-CM | POA: Diagnosis not present

## 2022-08-22 DIAGNOSIS — N189 Chronic kidney disease, unspecified: Secondary | ICD-10-CM | POA: Insufficient documentation

## 2022-08-22 DIAGNOSIS — I5032 Chronic diastolic (congestive) heart failure: Secondary | ICD-10-CM | POA: Insufficient documentation

## 2022-08-22 DIAGNOSIS — E039 Hypothyroidism, unspecified: Secondary | ICD-10-CM | POA: Insufficient documentation

## 2022-08-22 DIAGNOSIS — I130 Hypertensive heart and chronic kidney disease with heart failure and stage 1 through stage 4 chronic kidney disease, or unspecified chronic kidney disease: Secondary | ICD-10-CM | POA: Insufficient documentation

## 2022-08-22 DIAGNOSIS — J449 Chronic obstructive pulmonary disease, unspecified: Secondary | ICD-10-CM | POA: Insufficient documentation

## 2022-08-22 DIAGNOSIS — F0150 Vascular dementia without behavioral disturbance: Secondary | ICD-10-CM | POA: Insufficient documentation

## 2022-08-22 LAB — CBC WITH DIFFERENTIAL/PLATELET
Abs Immature Granulocytes: 0.04 10*3/uL (ref 0.00–0.07)
Basophils Absolute: 0.1 10*3/uL (ref 0.0–0.1)
Basophils Relative: 1 %
Eosinophils Absolute: 0.2 10*3/uL (ref 0.0–0.5)
Eosinophils Relative: 2 %
HCT: 44.6 % (ref 36.0–46.0)
Hemoglobin: 14.6 g/dL (ref 12.0–15.0)
Immature Granulocytes: 0 %
Lymphocytes Relative: 16 %
Lymphs Abs: 1.5 10*3/uL (ref 0.7–4.0)
MCH: 32.6 pg (ref 26.0–34.0)
MCHC: 32.7 g/dL (ref 30.0–36.0)
MCV: 99.6 fL (ref 80.0–100.0)
Monocytes Absolute: 1.4 10*3/uL — ABNORMAL HIGH (ref 0.1–1.0)
Monocytes Relative: 15 %
Neutro Abs: 6.2 10*3/uL (ref 1.7–7.7)
Neutrophils Relative %: 66 %
Platelets: 159 10*3/uL (ref 150–400)
RBC: 4.48 MIL/uL (ref 3.87–5.11)
RDW: 13.2 % (ref 11.5–15.5)
WBC: 9.4 10*3/uL (ref 4.0–10.5)
nRBC: 0 % (ref 0.0–0.2)

## 2022-08-22 LAB — TROPONIN I (HIGH SENSITIVITY)
Troponin I (High Sensitivity): 5 ng/L (ref <18)
Troponin I (High Sensitivity): 6 ng/L (ref <18)

## 2022-08-22 LAB — COMPREHENSIVE METABOLIC PANEL
ALT: 18 U/L (ref 0–44)
AST: 21 U/L (ref 15–41)
Albumin: 4.1 g/dL (ref 3.5–5.0)
Alkaline Phosphatase: 52 U/L (ref 38–126)
Anion gap: 13 (ref 5–15)
BUN: 29 mg/dL — ABNORMAL HIGH (ref 8–23)
CO2: 25 mmol/L (ref 22–32)
Calcium: 9.5 mg/dL (ref 8.9–10.3)
Chloride: 103 mmol/L (ref 98–111)
Creatinine, Ser: 1.26 mg/dL — ABNORMAL HIGH (ref 0.44–1.00)
GFR, Estimated: 41 mL/min — ABNORMAL LOW (ref >60)
Glucose, Bld: 104 mg/dL — ABNORMAL HIGH (ref 70–99)
Potassium: 4.2 mmol/L (ref 3.5–5.1)
Sodium: 141 mmol/L (ref 135–145)
Total Bilirubin: 0.8 mg/dL (ref 0.3–1.2)
Total Protein: 7.2 g/dL (ref 6.5–8.1)

## 2022-08-22 LAB — D-DIMER, QUANTITATIVE: D-Dimer, Quant: 0.88 ug/mL-FEU — ABNORMAL HIGH (ref 0.00–0.50)

## 2022-08-22 MED ORDER — IOHEXOL 350 MG/ML SOLN
60.0000 mL | Freq: Once | INTRAVENOUS | Status: AC | PRN
Start: 2022-08-22 — End: 2022-08-22
  Administered 2022-08-22: 60 mL via INTRAVENOUS

## 2022-08-22 MED ORDER — AZITHROMYCIN 250 MG PO TABS: ORAL_TABLET | ORAL | 0 refills | Status: AC

## 2022-08-22 MED ORDER — SODIUM CHLORIDE 0.9 % IV SOLN
500.0000 mg | Freq: Once | INTRAVENOUS | Status: AC
  Administered 2022-08-22: 500 mg via INTRAVENOUS
  Filled 2022-08-22: qty 5

## 2022-08-22 MED ORDER — ACETAMINOPHEN 500 MG PO TABS
1000.0000 mg | ORAL_TABLET | Freq: Once | ORAL | Status: AC
  Administered 2022-08-22: 1000 mg via ORAL
  Filled 2022-08-22: qty 2

## 2022-08-22 MED ORDER — CEFUROXIME AXETIL 500 MG PO TABS: 500.0000 mg | ORAL_TABLET | Freq: Two times a day (BID) | ORAL | 0 refills | Status: AC

## 2022-08-22 MED ORDER — SODIUM CHLORIDE 0.9 % IV SOLN
1.0000 g | Freq: Once | INTRAVENOUS | Status: AC
  Administered 2022-08-22: 1 g via INTRAVENOUS
  Filled 2022-08-22: qty 10

## 2022-08-22 NOTE — ED Notes (Signed)
Helped pt to [facility] to urinate. Pt urinated several times. Pt stood up several times then started urinating again. Helped pt perform peri care.

## 2022-08-22 NOTE — ED Triage Notes (Signed)
To triage via [facility] from [facility] assisted [facility]. Pt c/o Midsternal chest pain radiating to lef side, Denies sob, denies injury. Pain mainly with inspiration. Received with EMS 20G Left FA 324mg  ASA

## 2022-08-22 NOTE — ED Provider Notes (Addendum)
[facility] Provider Note    Event Date/Time   First MD Initiated Contact with Patient 08/22/22 [phone]     (approximate)   History   Chest Pain   HPI  [name] is a 89 y.o. female past medical history significant for chronic diastolic heart failure, right-sided breast cancer, CKD, COPD, GERD, hypertension, hyperlipidemia, hypothyroidism, paroxysmal atrial fibrillation, vascular dementia, who presents to the emergency department with chest pain.  Patient lives at an assisted living facility and states that at 4:00 this morning she rolled over in bed and started having pain in the left side of her chest.  Felt like it was worse every time she moved and when she took a big deep breath.  Denies any shortness of breath.  States that she got 324 mg of aspirin with EMS and had some improvement of her chest pain.  Minimal chest pain with deep inspiration but otherwise no chest pain at rest.  Denies any nausea, vomiting or diaphoresis.  No recent cough or pneumonia.  No recent hospitalizations.  Denies any falls or trauma.  Does not recall last stress testing.  Does not believe that she is referred received prior cardiac catheterization.     Physical Exam   Triage Vital Signs: ED Triage Vitals  Enc Vitals Group     BP 08/22/22 0529 (!) 169/89     Pulse Rate 08/22/22 0529 92     Resp 08/22/22 0529 18     Temp 08/22/22 0529 98.2 F (36.8 C)     Temp Source 08/22/22 0529 Oral     SpO2 08/22/22 0529 99 %     Weight 08/22/22 0516 130 lb (59 kg)     Height 08/22/22 0516 5\' 8"  (1.727 m)     Head Circumference --      Peak Flow --      Pain Score 08/22/22 0516 5     Pain Loc --      Pain Edu? --      Excl. in GC? --     Most recent vital signs: Vitals:   08/22/22 0529 08/22/22 1007  BP: (!) 169/89 (!) 151/83  Pulse: 92 95  Resp: 18 20  Temp: 98.2 F (36.8 C)   SpO2: 99% 100%    Physical Exam Constitutional:      Appearance: She is well-developed.   HENT:     Head: Atraumatic.  Eyes:     Conjunctiva/sclera: Conjunctivae normal.  Cardiovascular:     Rate and Rhythm: Regular rhythm.     Heart sounds: No murmur heard. Pulmonary:     Effort: No respiratory distress.     Breath sounds: No decreased breath sounds or wheezing.     Comments: Tachycardic, irregular rhythm Abdominal:     General: There is no distension.     Palpations: Abdomen is soft.  Musculoskeletal:        General: Normal range of motion.     Cervical back: Normal range of motion.     Right lower leg: No tenderness. No edema.     Left lower leg: No tenderness. No edema.  Skin:    General: Skin is warm.     Capillary Refill: Capillary refill takes less than 2 seconds.  Neurological:     General: No focal deficit present.     Mental Status: She is alert. Mental status is at baseline.     IMPRESSION / MDM / ASSESSMENT AND PLAN / ED COURSE  I reviewed the triage vital signs and the nursing notes.  Low risk Wells criteria, will add on a D-dimer to further risk ratified for pulmonary embolism.  Differential diagnosis including ACS, pneumonia, heart failure exacerbation, GERD, PE.  Low suspicion for dissection, no tearing chest pain.  Possibly musculoskeletal given pain that is worse with movement.  EKG  I, Corena Herter, the attending physician, personally viewed and interpreted this ECG.  Atrial fibrillation with underlying right bundle branch block.  No significant change compared to prior EKG.  No significant ST elevation or depression.  No signs of acute ischemia or dysrhythmia.  No tachycardic or bradycardic dysrhythmias while on cardiac telemetry.  RADIOLOGY I independently reviewed imaging, my interpretation of imaging: With no acute findings.  Read as mild interstitial edema.  LABS (all labs ordered are listed, but only abnormal results are displayed) Labs interpreted as -    Labs Reviewed  CBC WITH DIFFERENTIAL/PLATELET - Abnormal; Notable for  the following components:      Result Value   Monocytes Absolute 1.4 (*)    All other components within normal limits  COMPREHENSIVE METABOLIC PANEL - Abnormal; Notable for the following components:   Glucose, Bld 104 (*)    BUN 29 (*)    Creatinine, Ser 1.26 (*)    GFR, Estimated 41 (*)    All other components within normal limits  D-DIMER, QUANTITATIVE - Abnormal; Notable for the following components:   D-Dimer, Quant 0.88 (*)    All other components within normal limits  TROPONIN I (HIGH SENSITIVITY)  TROPONIN I (HIGH SENSITIVITY)     MDM    Patient has a heart score of 4.  Currently chest pain-free.  Serial troponins are undetectable.  After shared decision making with the patient, offered admission for further stress testing and restratification to workup risk factors for coronary artery disease.  Ultimately patient wants to go home and follow-up with her cardiologist.  States that she would return if chest pain returns or she develops new symptoms.  CTA with no signs of pulmonary embolism.  Multiple areas concerning for bronchopneumonia.  Patient was given a dose of IV Rocephin and azithromycin based on current allergies.  Patient without significant cough, leukocytosis or fever.  Given her pleuritic chest pain will start the patient on antibiotics.  Started on cefuroxime and azithromycin.  Given information to follow-up closely with her primary care provider and cardiology.  Given return precautions.   PROCEDURES:  Critical Care performed: No  Procedures  Patient's presentation is most consistent with acute presentation with potential threat to life or bodily function.   MEDICATIONS ORDERED IN ED: Medications  cefTRIAXone (ROCEPHIN) 1 g in sodium chloride 0.9 % 100 mL IVPB (1 g Intravenous New Bag/Given 08/22/22 1009)  azithromycin (ZITHROMAX) 500 mg in sodium chloride 0.9 % 250 mL IVPB (has no administration in time range)  iohexol (OMNIPAQUE) 350 MG/ML injection 60 mL  (60 mLs Intravenous Contrast Given 08/22/22 0905)  acetaminophen (TYLENOL) tablet 1,000 mg (1,000 mg Oral Given 08/22/22 1008)    FINAL CLINICAL IMPRESSION(S) / ED DIAGNOSES   Final diagnoses:  Chest pain, unspecified type  Community acquired pneumonia, unspecified laterality     Rx / DC Orders   ED Discharge Orders          Ordered    azithromycin (ZITHROMAX Z-PAK) 250 MG tablet        08/22/22 1021    cefUROXime (CEFTIN) 500 MG tablet  2 times daily with meals  08/22/22 1021    Ambulatory referral to Cardiology       Comments: If you have not heard from the Cardiology office within the next 72 hours please call [phone].   08/22/22 1021             Note:  This document was prepared using Dragon voice recognition software and may include unintentional dictation errors.   Corena Herter, MD 08/22/22 1006    Corena Herter, MD 08/22/22 1022

## 2022-08-22 NOTE — Discharge Instructions (Signed)
You are seen in the emergency department with chest pain when you take a deep breath.  On CT scan, you do not have any findings concerning for a blood clot.  You had findings concerning for possible pneumonia.  You were given your first dose of IV antibiotics in the emergency department.  You will be started on 2 different antibiotics and is importantly pick up these antibiotics and take your next dose tonight.  You had findings of coronary calcifications on your CT scan.  It is importantly follow-up closely with your cardiologist for further evaluation and possible stress testing of your heart.  Return to the emergency department for any return of chest pain, shortness of breath or fever.  You can alternate Tylenol and ibuprofen as needed for chest pain.  Pain control:  Ibuprofen (motrin/aleve/advil) - You can take 3 tablets (600) every 6 hours as needed for pain/fever.  Acetaminophen (tylenol) - You can take 2 extra strength tablets (1000 mg) every 6 hours as needed for pain/fever.  You can alternate these medications or take them together.  Make sure you eat food/drink water when taking these medications.

## 2022-10-02 ENCOUNTER — Ambulatory Visit: Payer: Medicare HMO | Admitting: Cardiology

## 2023-07-02 ENCOUNTER — Other Ambulatory Visit: Payer: Self-pay

## 2023-07-02 ENCOUNTER — Emergency Department
Admission: EM | Admit: 2023-07-02 | Discharge: 2023-07-03 | Disposition: A | Discharge status: Skilled Nursing Facility | Attending: Emergency Medicine | Admitting: Emergency Medicine

## 2023-07-02 DIAGNOSIS — F4321 Adjustment disorder with depressed mood: Secondary | ICD-10-CM | POA: Insufficient documentation

## 2023-07-02 DIAGNOSIS — Z79899 Other long term (current) drug therapy: Secondary | ICD-10-CM | POA: Diagnosis not present

## 2023-07-02 DIAGNOSIS — G479 Sleep disorder, unspecified: Secondary | ICD-10-CM | POA: Insufficient documentation

## 2023-07-02 DIAGNOSIS — F32A Depression, unspecified: Secondary | ICD-10-CM | POA: Insufficient documentation

## 2023-07-02 DIAGNOSIS — Z7901 Long term (current) use of anticoagulants: Secondary | ICD-10-CM | POA: Diagnosis not present

## 2023-07-02 DIAGNOSIS — N189 Chronic kidney disease, unspecified: Secondary | ICD-10-CM | POA: Diagnosis not present

## 2023-07-02 DIAGNOSIS — F0283 Dementia in other diseases classified elsewhere, unspecified severity, with mood disturbance: Secondary | ICD-10-CM | POA: Insufficient documentation

## 2023-07-02 DIAGNOSIS — I2510 Atherosclerotic heart disease of native coronary artery without angina pectoris: Secondary | ICD-10-CM | POA: Insufficient documentation

## 2023-07-02 DIAGNOSIS — J449 Chronic obstructive pulmonary disease, unspecified: Secondary | ICD-10-CM | POA: Diagnosis not present

## 2023-07-02 DIAGNOSIS — F03918 Unspecified dementia, unspecified severity, with other behavioral disturbance: Secondary | ICD-10-CM | POA: Insufficient documentation

## 2023-07-02 LAB — CBC
HCT: 48.5 % — ABNORMAL HIGH (ref 36.0–46.0)
Hemoglobin: 16.2 g/dL — ABNORMAL HIGH (ref 12.0–15.0)
MCH: 33.5 pg (ref 26.0–34.0)
MCHC: 33.4 g/dL (ref 30.0–36.0)
MCV: 100.4 fL — ABNORMAL HIGH (ref 80.0–100.0)
Platelets: 195 10*3/uL (ref 150–400)
RBC: 4.83 MIL/uL (ref 3.87–5.11)
RDW: 13.6 % (ref 11.5–15.5)
WBC: 6.8 10*3/uL (ref 4.0–10.5)
nRBC: 0 % (ref 0.0–0.2)

## 2023-07-02 LAB — COMPREHENSIVE METABOLIC PANEL
ALT: 25 U/L (ref 0–44)
AST: 35 U/L (ref 15–41)
Albumin: 4.4 g/dL (ref 3.5–5.0)
Alkaline Phosphatase: 44 U/L (ref 38–126)
Anion gap: 10 (ref 5–15)
BUN: 30 mg/dL — ABNORMAL HIGH (ref 8–23)
CO2: 26 mmol/L (ref 22–32)
Calcium: 10.1 mg/dL (ref 8.9–10.3)
Chloride: 102 mmol/L (ref 98–111)
Creatinine, Ser: 1.41 mg/dL — ABNORMAL HIGH (ref 0.44–1.00)
GFR, Estimated: 36 mL/min — ABNORMAL LOW (ref >60)
Glucose, Bld: 97 mg/dL (ref 70–99)
Potassium: 4.5 mmol/L (ref 3.5–5.1)
Sodium: 138 mmol/L (ref 135–145)
Total Bilirubin: 1 mg/dL (ref 0.0–1.2)
Total Protein: 7.4 g/dL (ref 6.5–8.1)

## 2023-07-02 LAB — SALICYLATE LEVEL: Salicylate Lvl: <7 mg/dL — ABNORMAL LOW (ref 7.0–30.0)

## 2023-07-02 LAB — ETHANOL: Alcohol, Ethyl (B): <10 mg/dL (ref <10)

## 2023-07-02 LAB — ACETAMINOPHEN LEVEL: Acetaminophen (Tylenol), Serum: <10 ug/mL — ABNORMAL LOW (ref 10–30)

## 2023-07-02 NOTE — ED Notes (Signed)
 This tech obtained vital signs on pt.

## 2023-07-02 NOTE — ED Triage Notes (Signed)
 See first nurse note. Pt denies SI/HI at this time. Reports has had some depression but would never harm herself. Oriented to time and person but states she is unsure why she is here.  Pt agreeable to blood work, refusing dress out at this time.

## 2023-07-02 NOTE — ED Provider Notes (Signed)
 [facility] Provider Note    Event Date/Time   First MD Initiated Contact with Patient 07/02/23 1637     (approximate)   History   Psychiatric Evaluation   HPI  [name] is a 89 y.o. female who presents to the ED for evaluation of Psychiatric Evaluation   I review a cardiology clinic visit from 5 days ago.  Seen for routine follow-up.  History of CAD and PAD, CKD, COPD, vascular dementia, atrial fibrillation.  Anticoagulated on Eliquis.  Patient presents for evaluation of a reported threat that she was going to harm herself at her SNF.  Per reports, staff was concerned that she was going to jump over a balcony last night.   Patient does not recall this particular episode.  She acknowledges sometimes getting frustrated at her facility and has had spells of depression but would never harm herself and has no recollection of ever threatening to harm herself.  Currently denying SI, HI or any hallucinations.  She is frustrated and requesting to go back to her room at her facility.   Physical Exam   Triage Vital Signs: ED Triage Vitals  Encounter Vitals Group     BP 07/02/23 1630 129/74     Systolic BP Percentile --      Diastolic BP Percentile --      Pulse Rate 07/02/23 1630 74     Resp 07/02/23 1630 18     Temp 07/02/23 1630 98.1 F (36.7 C)     Temp src --      SpO2 07/02/23 1630 100 %     Weight 07/02/23 1634 140 lb (63.5 kg)     Height 07/02/23 1634 5\' 8"  (1.727 m)     Head Circumference --      Peak Flow --      Pain Score 07/02/23 1631 0     Pain Loc --      Pain Education --      Exclude from Growth Chart --     Most recent vital signs: Vitals:   07/02/23 1630 07/02/23 1957  BP: 129/74 113/64  Pulse: 74 69  Resp: 18 20  Temp: 98.1 F (36.7 C) 97.6 F (36.4 C)  SpO2: 100% 95%    General: Awake, no distress.  CV:  Good peripheral perfusion.  Resp:  Normal effort.  Abd:  No distention.  MSK:  No deformity noted.   Neuro:  No focal deficits appreciated. Other:     ED Results / Procedures / Treatments   Labs (all labs ordered are listed, but only abnormal results are displayed) Labs Reviewed  COMPREHENSIVE METABOLIC PANEL - Abnormal; Notable for the following components:      Result Value   BUN 30 (*)    Creatinine, Ser 1.41 (*)    GFR, Estimated 36 (*)    All other components within normal limits  SALICYLATE LEVEL - Abnormal; Notable for the following components:   Salicylate Lvl <7.0 (*)    All other components within normal limits  ACETAMINOPHEN LEVEL - Abnormal; Notable for the following components:   Acetaminophen (Tylenol), Serum <10 (*)    All other components within normal limits  CBC - Abnormal; Notable for the following components:   Hemoglobin 16.2 (*)    HCT 48.5 (*)    MCV 100.4 (*)    All other components within normal limits  ETHANOL  URINE DRUG SCREEN, QUALITATIVE ([facility] ONLY)    EKG   RADIOLOGY  Official radiology report(s): No results found.  PROCEDURES and INTERVENTIONS:  Procedures  Medications - No data to display   IMPRESSION / MDM / ASSESSMENT AND PLAN / ED COURSE  I reviewed the triage vital signs and the nursing notes.  Differential diagnosis includes, but is not limited to, suicidal, overdose, substance abuse, acute stress reaction or acute depressive episode  {Patient presents with symptoms of an acute illness or injury that is potentially life-threatening.  Patient presents to the ED for psychiatric evaluation due to intermittent acute depression at her facility.  She reports feeling fine right now and has no SI, HI.  No signs of overdose, trauma or acute medical pathology to preclude psychiatric evaluation or disposition.      FINAL CLINICAL IMPRESSION(S) / ED DIAGNOSES   Final diagnoses:  Acute depression     Rx / DC Orders   ED Discharge Orders     None        Note:  This document was prepared using Dragon voice  recognition software and may include unintentional dictation errors.   Delton Prairie, MD 07/02/23 2255

## 2023-07-02 NOTE — ED Provider Triage Note (Signed)
 Emergency Medicine Provider Triage Evaluation Note  Dhanya Bogle Dible , a 89 y.o. female  was evaluated in triage.  Pt complains of being brought here for psychiatric evaluation. Patient lives at home care and staff reported that she tried to jump over a balcony. Patient states she has had some depression but denies any SI/HI. Patient states she did not try to jump over a balcony. She doesn't know why she is here.   Review of Systems  Positive:  Negative:   Physical Exam  There were no vitals taken for this visit. Gen:   Awake, no distress   Resp:  Normal effort  MSK:   Moves extremities without difficulty  Other:    Medical Decision Making  Medically screening exam initiated at 4:28 PM.  Appropriate orders placed.  Marvia Pickles Kyne was informed that the remainder of the evaluation will be completed by another provider, this initial triage assessment does not replace that evaluation, and the importance of remaining in the ED until their evaluation is complete.     Cameron Ali, PA-C 07/02/23 1630

## 2023-07-02 NOTE — ED Notes (Signed)
 Pt son requesting to be updated after psych consult.

## 2023-07-02 NOTE — ED Triage Notes (Signed)
 First nurse note: Patient brought in from home place by [facility]. Staff reported patient tried to go over balcony last night.   Denies SI/HI with EMS at this time  EMS vitals: 153/78 b/p 65HR 98% RA

## 2023-07-02 NOTE — ED Notes (Addendum)
 Patient Belongings:  2 black shoes 2 white socks 1 pair of blue pants 1 tan bra/bra insert 1 black/white long sleeve shirt 1 yellow ring 1 yellow necklace 2 hearing aides  1 bag of belongings

## 2023-07-02 NOTE — ED Notes (Signed)
 Patient changed out by this RN and [name], EDT. Patient changed out of belongings to purple scrubs. 1/1 bag collected and placed behind nurses station. Patient also cleaned up. Peri Care completed & new depend/bed pad. Patient states she normally walks with a walker and knows when she needs to use the bathroom states she only goes in her depend when absolutely has to.

## 2023-07-02 NOTE — ED Notes (Signed)
 VOL/  PENDING  CONSULT

## 2023-07-03 LAB — URINE DRUG SCREEN, QUALITATIVE (ARMC ONLY)
Amphetamines, Ur Screen: NOT DETECTED
Barbiturates, Ur Screen: NOT DETECTED
Benzodiazepine, Ur Scrn: NOT DETECTED
Cannabinoid 50 Ng, Ur ~~LOC~~: NOT DETECTED
Cocaine Metabolite,Ur ~~LOC~~: NOT DETECTED
MDMA (Ecstasy)Ur Screen: NOT DETECTED
Methadone Scn, Ur: NOT DETECTED
Opiate, Ur Screen: POSITIVE — AB
Phencyclidine (PCP) Ur S: NOT DETECTED
Tricyclic, Ur Screen: NOT DETECTED

## 2023-07-03 NOTE — ED Notes (Signed)
 Pt complaining about breeze coming in through EMS bay. Warm blankets placed in pt and stretcher moved away from EMS bay doors. Denies any other wants/needs at this time.

## 2023-07-03 NOTE — ED Notes (Signed)
 Pt being helped into their own belongings by EDT Emilee.

## 2023-07-03 NOTE — ED Provider Notes (Signed)
 Emergency Medicine Observation Re-evaluation Note  [name] is a 89 y.o. female, seen on rounds today.  Pt initially presented to the ED for complaints of Psychiatric Evaluation Currently, the patient is resting, voices no medical complaints.  Physical Exam  BP 113/64 (BP Location: Left Arm)   Pulse 69   Temp 97.6 F (36.4 C) (Oral)   Resp 20   Ht 5\' 8"  (1.727 m)   Wt 63.5 kg   SpO2 95%   BMI 21.29 kg/m  Physical Exam General: Resting in no acute distress Cardiac: No cyanosis Lungs: Clear rise and fall Psych: Not agitated  ED Course / MDM  EKG:   I have reviewed the labs performed to date as well as medications administered while in observation.  Recent changes in the last 24 hours include no events overnight.  Plan  Current plan is for patient cleared by psychiatry for discharge.    Irean Hong, MD 07/03/23 [phone]

## 2023-07-03 NOTE — ED Notes (Signed)
 Pt given warm blankets and coffee per their request.

## 2023-07-03 NOTE — ED Notes (Signed)
 Pt used walker to ambulate to restroom in the hallway. Pt returned to stretcher and was covered back up with blankets. Pt complained that the blankets were cold and requested new ones along with coffee.  Pt's bed is in the lowest, locked position. Pt tolerated activity well.

## 2023-07-03 NOTE — Discharge Instructions (Addendum)
 Take your medicines daily as directed by your doctor.  Return to the ER for worsening symptoms, feelings of hurting yourself or others, or other concerns.  Psychiatry has seen and evaluated the patient, they have advised the patient to be at low risk for self-harm. "There are no psychiatric contraindications to discharge at this time."

## 2023-07-03 NOTE — ED Provider Notes (Signed)
 Per psychiatry note from today by NP McLauchlin:  Psychiatry has seen and evaluated the patient, they have advised the patient to be at low risk for self-harm. "There are no psychiatric contraindications to discharge at this time."   Sharyn Creamer, MD 07/03/23 [phone]

## 2023-07-03 NOTE — Consult Note (Signed)
**Note Latoya-Identified via Obfuscation**  [facility] Psychiatric Consult Initial  Patient Name: .[name]  MRN: [id number]  DOB: 1932-09-27  Consult Order details:  Orders (From admission, onward)     Start     Ordered   07/02/23 1638  CONSULT TO CALL ACT TEAM       Ordering Provider: Delton Prairie, MD  Provider:  (Not yet assigned)  Question:  Reason for Consult?  Answer:  Psych consult   07/02/23 1637   07/02/23 1638  IP CONSULT TO PSYCHIATRY       Ordering Provider: Delton Prairie, MD  Provider:  (Not yet assigned)  Question Answer Comment  Place call to: psych   Reason for Consult Admit      07/02/23 1637             Mode of Visit: Tele-visit Virtual Statement:TELE PSYCHIATRY ATTESTATION & CONSENT As the provider for this telehealth consult, I attest that I verified the patient's identity using two separate identifiers, introduced myself to the patient, provided my credentials, disclosed my location, and performed this encounter via a HIPAA-compliant, real-time, face-to-face, two-way, interactive audio and video platform and with the full consent and agreement of the patient (or guardian as applicable.) Patient physical location: [facility]. Telehealth provider physical location: home office in state of [place].   Video start time:   Video end time:      Psychiatry Consult Evaluation  Service Date: July 03, 2023 LOS:  LOS: 0 days  Chief Complaint patient reported attempting to go over a balcony  Primary Psychiatric Diagnoses  Dementia with behavioral disturbance 2.  Sleep disturbance  Assessment  [name] is a 89 y.o. female admitted: Presented to the EDfor 07/02/2023  4:36 PM for reported because she tried to go over a balcony. She carries the psychiatric diagnoses of dementia and has a past medical history of hypertension and renal insufficiency.   Her current presentation of mood dysregulation and impulsivity is most consistent with dementia-related behavioral disturbance.  She meets criteria for discharge as she has been assessed and observed based on concerns for safety and cognitive decline. . On initial examination, patient appears slightly disorganized in her thought process. Please see plan below for detailed recommendations. Diagnoses:  Active Hospital problems: Active Problems:   * No active hospital problems. *    Plan   ## Medical Decision Making Capacity: Not specifically addressed in this encounter ## Disposition:-- There are no psychiatric contraindications to discharge at this time  ## Behavioral / Environmental: - No specific recommendations at this time.     ## Safety and Observation Level:  - Based on my clinical evaluation, I estimate the patient to be at low risk of self harm in the current setting. - At this time, we recommend  routine. This decision is based on my review of the chart including patient's history and current presentation, interview of the patient, mental status examination, and consideration of suicide risk including evaluating suicidal ideation, plan, intent, suicidal or self-harm behaviors, risk factors, and protective factors. This judgment is based on our ability to directly address suicide risk, implement suicide prevention strategies, and develop a safety plan while the patient is in the clinical setting. Please contact our team if there is a concern that risk level has changed.  CSSR Risk Category:C-SSRS RISK CATEGORY: No Risk  Suicide Risk Assessment: Patient has following modifiable risk factors for suicide: recklessness, which we are addressing by coordinating with facility staff regarding behavioral concerns. Patient has  following non-modifiable or demographic risk factors for suicide: early widowhood Patient has the following protective factors against suicide: Supportive family and no history of suicide attempts  Thank you for this consult request. Recommendations have been communicated to the primary team.  We  will recommendation of discharge at this time.   Latoya Burrs, NP       History of Present Illness  Relevant Aspects of Hospital ED Course:  Admitted on 07/02/2023 for Report of patient attempting to go over balcony.   Patient Report:  [name] is an 89 year old female with a history of dementia who was brought to the emergency department after it was reported at her facility that she attempted to go over a balcony. Upon evaluation, the patient denies suicidal ideation, homicidal ideation, and auditory or visual hallucinations, stating, "I am not brave enough to kill myself." Her mood is described as "not real happy." She reports not sleeping well for two nights but usually sleeps well throughout the night. She is widowed, having lost her husband four years ago after 40 years of marriage. She has two children, a son and a daughter.  The patient expresses a strong desire to return to her assisted living facility, "[facility]," and notes that she came to the hospital with another patient. She also states that a man she has known for six months wanted to marry her. Given her dementia diagnosis, inconsistencies in her reported history are noted. Her overall presentation raises concerns about cognitive decline, mood disturbance, and possible impulsivity.  Psych ROS:  Depression: Yes Anxiety: No   Review of Systems  Constitutional: Negative.   HENT:  Positive for hearing loss.   Eyes: Negative.   Respiratory: Negative.    Cardiovascular: Negative.   Gastrointestinal: Negative.   Genitourinary: Negative.   Musculoskeletal: Negative.   Skin: Negative.   Neurological: Negative.      Psychiatric and Social History  Psychiatric History:  Information collected from patient history  Prev Dx/Sx: Dementia Current Psych Provider: Unknown Home Meds (current): Unknown Previous Med Trials: Unknown  Family Psych History: Unknown Family Hx suicide: Unknown  Social History:  Legal Hx:  None [facility] Spiritual Hx: Unknown Access to weapons/lethal means: No  Substance History Alcohol: no  Tobacco: No Illicit drugs: No Prescription drug abuse: No  Exam Findings  Physical Exam:  Vital Signs:  Temp:  [97.6 F (36.4 C)-98.1 F (36.7 C)] 97.6 F (36.4 C) (03/17 1957) Pulse Rate:  [69-74] 69 (03/17 1957) Resp:  [18-20] 20 (03/17 1957) BP: (113-129)/(64-74) 113/64 (03/17 1957) SpO2:  [95 %-100 %] 95 % (03/17 1957) Weight:  [63.5 kg] 63.5 kg (03/17 1634) Blood pressure 113/64, pulse 69, temperature 97.6 F (36.4 C), temperature source Oral, resp. rate 20, height 5\' 8"  (1.727 m), weight 63.5 kg, SpO2 95%. Body mass index is 21.29 kg/m.  Physical Exam HENT:     Head: Normocephalic.     Nose: Nose normal.  Eyes:     Extraocular Movements: Extraocular movements intact.  Pulmonary:     Effort: Pulmonary effort is normal.  Musculoskeletal:        General: Normal range of motion.     Cervical back: Normal range of motion.  Skin:    General: Skin is dry.  Neurological:     Mental Status: She is alert. Mental status is at baseline.  Psychiatric:        Behavior: Behavior normal.  Other History   These have been pulled in through the EMR, reviewed, and updated if appropriate.  Family History:  The patient's family history includes Breast cancer in her maternal aunt; Coronary artery disease (age of onset: 15) in her sister; Heart attack in her father.  Medical History: Past Medical History:  Diagnosis Date   (HFpEF) heart failure with preserved ejection fraction (HCC)    a.) TTE 12/2015: EF 60-65%. no rwma, G2DD, triv AI, mild MR, mildly dil LA. Mild-mod TR. PASP ; b.) TTE 03/01/2018: EF 55-60%, no rwma, mild MR, mildly dil LA, nl PASP. c.) TTE 10/26/2020: EF >55%; mild RA and mod LA enlargement. Triv-mild panvalvular regurgitation.   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   Edema    FEET/ANKLES   GERD (gastroesophageal reflux disease)    History of sepsis    History of stress test    a. 08/2010: EF 73%, no ischemia/infarct.   Hyperlipidemia    Hypertension    Hypothyroid    Lumbar disc disease    Mixed Alzheimer's and vascular dementia (HCC)    PAD (peripheral artery disease) (HCC)    a. 08/2015 s/p PTA of R AT w/ drug-coated balloon angioplasty to R Popliteal; b. 05/2017 ABI: R 1.16, L 0.88-->stable.   PAF (paroxysmal atrial fibrillation) (HCC)    a.) CHA2DS2-VASc = 8 (age x 2, sex, HFpEF, HTN, DVT x2, PVD). b.) rate/rhythm maintained on amiodarone + metoprolol succinate; chronically anticoagulated with low dose apixaban + ASA. c.) s/p DCCV 08/26/2010 and 11/10/2011   PONV (postoperative nausea and vomiting)    nausea vomiting long ago with surgery but not recent ones   RBBB (right bundle branch block)     Surgical History: Past Surgical History:  Procedure Laterality Date   ABDOMINAL EXPLORATION SURGERY     "had to go back in after hysterectomy & check on stitch dr had put near my bladder; don't know if they took it out; had to wear catheter for 1 month"   ABDOMINAL HYSTERECTOMY     with BSO   APPENDECTOMY     BACK SURGERY     BALLOON ANGIOPLASTY, ARTERY Right 09/08/2015   superficial femoral   BREAST EXCISIONAL BIOPSY  Right 2007   positive   BREAST SURGERY     CARDIOVERSION  11/10/2011   Procedure: CARDIOVERSION;  Surgeon: Lewayne Bunting, MD;  Location: [facility] OR;  Service: Cardiovascular;  Laterality: N/A;   CARDIOVERSION  08/26/2010   CATARACT EXTRACTION W/PHACO Right 07/17/2017   Procedure: CATARACT EXTRACTION PHACO AND INTRAOCULAR LENS PLACEMENT (IOC);  Surgeon: Galen Manila, MD;  Location: [facility] ORS;  Service: Ophthalmology;  Laterality: Right;  [place]  00:44.0 AP% 14.6 CDE 6.44 FLUID PACK LOT # [id number]   CATARACT EXTRACTION W/PHACO Left 08/07/2017   Procedure: CATARACT EXTRACTION PHACO AND INTRAOCULAR LENS PLACEMENT (IOC);  Surgeon: Galen Manila, MD;  Location: [facility] ORS;  Service: Ophthalmology;  Laterality: Left;  [place] 00:44 AP% 14.1 CDE 6.32 Fluid pack lot # [id number]   COLONOSCOPY WITH PROPOFOL N/A 01/04/2016   Procedure: COLONOSCOPY WITH PROPOFOL;  Surgeon: Christena Deem, MD;  Location: [facility] ENDOSCOPY;  Service: Endoscopy;  Laterality: N/A;   HALLUX VALGUS CORRECTION     HARDWARE REMOVAL Left 03/18/2021   Procedure: HARDWARE REMOVAL;  Surgeon: Lyndle Herrlich, MD;  Location: [facility] ORS;  Service: Orthopedics;  Laterality: Left;   HIP CLOSED REDUCTION Left 01/19/2021   Procedure: CLOSED REDUCTION HIP;  Surgeon: Lyndle Herrlich, MD;  Location: [facility] ORS;  Service: Orthopedics;  Laterality: Left;   KYPHOPLASTY N/A 06/25/2018   Procedure: KYPHOPLASTY L3;  Surgeon: Kennedy Bucker, MD;  Location: [facility] ORS;  Service: Orthopedics;  Laterality: N/A;   LUMBAR DISC SURGERY     MASTECTOMY Right 2013   positive   ORIF ELBOW FRACTURE Left 01/19/2021   Procedure: OPEN REDUCTION INTERNAL FIXATION (ORIF) ELBOW/OLECRANON FRACTURE;  Surgeon: Lyndle Herrlich, MD;  Location: [facility] ORS;  Service: Orthopedics;  Laterality: Left;   PERCUTANEOUS PINNING Left 01/19/2021   Procedure: PERCUTANEOUS PINNING EXTREMITY;  Surgeon: Lyndle Herrlich, MD;  Location: [facility] ORS;  Service: Orthopedics;  Laterality: Left;    PERIPHERAL VASCULAR CATHETERIZATION N/A 09/08/2015   Procedure: Abdominal Aortogram w/Lower Extremity;  Surgeon: Iran Ouch, MD;  Location: [facility] INVASIVE CV LAB;  Service: Cardiovascular;  Laterality: N/A;   PERIPHERAL VASCULAR CATHETERIZATION  09/08/2015   Procedure: Peripheral Vascular Balloon Angioplasty;  Surgeon: Iran Ouch, MD;  Location: [facility] INVASIVE CV LAB;  Service: Cardiovascular;;   REVISION TOTAL KNEE ARTHROPLASTY Right    rt.ankle ulcer surgery  2013   TEMPOROMANDIBULAR JOINT ARTHROPLASTY     TMJ ARTHROPLASTY     TONSILLECTOMY     TOTAL KNEE ARTHROPLASTY Right    TOTAL KNEE ARTHROPLASTY Left 02/26/2018   Procedure: TOTAL KNEE ARTHROPLASTY;  Surgeon: Christena Flake, MD;  Location: [facility] ORS;  Service: Orthopedics;  Laterality: Left;     Medications:  No current facility-administered medications for this encounter.  Current Outpatient Medications:    acetaminophen (TYLENOL) 500 MG tablet, Take 500 mg by mouth 2 (two) times daily., Disp: , Rfl:    buPROPion (WELLBUTRIN) 75 MG tablet, Take 75 mg by mouth in the morning., Disp: , Rfl:    busPIRone (BUSPAR) 10 MG tablet, Take 10 mg by mouth 2 (two) times daily., Disp: , Rfl:    calcium carbonate (TUMS EX) 750 MG chewable tablet, Chew 2 tablets by mouth every 6 (six) hours as needed., Disp: , Rfl:    cetirizine (ZYRTEC) 5 MG tablet, Take 5 mg by mouth daily., Disp: , Rfl:    Cholecalciferol (VITAMIN D3) 50 MCG (2000 UT) TABS, Take 2,000 Units by mouth daily., Disp: , Rfl:    ELIQUIS 2.5 MG TABS tablet, Take 2.5 mg by mouth 2 (two) times daily., Disp: , Rfl:    HYDROcodone-acetaminophen (NORCO) 7.5-325 MG tablet, Take 1 tablet by mouth 2 (two) times daily as needed (pain)., Disp: , Rfl:    HYDROcodone-acetaminophen (NORCO) 7.5-325 MG tablet, Take 1 tablet by mouth daily., Disp: , Rfl:    levothyroxine (SYNTHROID) 100 MCG tablet, Take 100 mcg by mouth daily before breakfast. Take on an empty stomach with a  glass of water at  least 30 to 60 minutes before breakfast., Disp: , Rfl:    metoprolol succinate (TOPROL-XL) 25 MG 24 hr tablet, Take 25 mg by mouth daily., Disp: , Rfl:    Multiple Vitamins-Minerals (CENTROVITE) TABS, Take 1 tablet by mouth daily., Disp: , Rfl:    Psyllium 0.36 g CAPS, Take 1 capsule by mouth 2 (two) times daily., Disp: , Rfl:    sertraline (ZOLOFT) 50 MG tablet, Take 50 mg by mouth daily., Disp: , Rfl:    spironolactone (ALDACTONE) 25 MG tablet, Take 1 tablet (25 mg total) by mouth daily., Disp: 30 tablet, Rfl: 0   escitalopram (LEXAPRO) 10 MG tablet, Take 10 mg by mouth daily., Disp: , Rfl:    HYDROcodone-acetaminophen (NORCO/VICODIN) 5-325 MG tablet, Take 1 tablet by mouth every 6 (six) hours as needed. (Patient not taking: Reported on 07/03/2023), Disp: 10 tablet, Rfl: 0   levothyroxine (SYNTHROID) 125 MCG tablet, Take 125 mcg by mouth daily with breakfast. (Patient not taking: Reported on 07/02/2023), Disp: , Rfl:   Allergies: Allergies  Allergen Reactions   Other Other (See Comments)    NO BP, VENIPUNCTURE, OR ACCESS IN RIGHT ARM - S/P MASTECTOMY ON RIGHT   Amlodipine Nausea And Vomiting   Ciprofloxacin Nausea Only   Doxycycline Itching and Other (See Comments)    shaky   Lipitor [Atorvastatin] Other (See Comments)    Joint pain    Morphine And Codeine Nausea And Vomiting   Macrobid [Nitrofurantoin Macrocrystal] Rash   Penicillins Rash and Other (See Comments)    Tolerated 1st generation cephalosporin (CEFAZOLIN) on 08/20/2013 and 01/19/2021 without documented ADRs.  PCN reaction causing immediate rash, facial/tongue/throat swelling, SOB or lightheadedness with hypotension: yes PCN reaction causing severe rash involving mucus membranes or skin necrosis: no PCN reaction that required hospitalization no PCN reaction occurring within the last 10 years: no If all of the above answers are "NO", then may proceed with Cephalosporin use.    Sulfa Antibiotics Rash    Latoya Burrs, NP

## 2023-07-03 NOTE — BH Assessment (Addendum)
 Comprehensive Clinical Assessment (CCA) Screening, Triage and Referral Note  07/03/2023 [name] [id number]  Chief Complaint:  Chief Complaint  Patient presents with   Psychiatric Evaluation   Visit Diagnosis: Adjustment Disorder  [name] is an 89 year old female who presents to the ER due to her Care Home was concerned about the patient making SI statements. Per the patient, she doesn't remember saying or doing anything to suggest she would harm herself. She states, she has never had the thoughts in the past or present. However, she spoke at length about moving into the current place and missing the residents from the former place. She acknowledges she's adjusting and having moments of being sad. She also spoke about how she gets confused because of the dementia.  During the interview, the patient was calm, cooperative and pleasant. She was able to provided appropriate answers to the questions. Throughout the interview, she denied SI/HI and AVH.  Writer spoke with [facility] ([phone]), and share no concerns about her harming herself or anyone else. Per the facility, the patient can return when psychiatrically cleared.  She will need to return via EMS.  Patient Reported Information How did you hear about [place]? Other (Comment)  What Is the Reason for Your Visit/Call Today? Care home was concerned about the patient making SI statements.  How Long Has This Been Causing You Problems? 1 wk - 1 month  What Do You Feel Would Help You the Most Today? Treatment for Depression or other mood problem   Have You Recently Had Any Thoughts About Hurting Yourself? No  Are You Planning to Commit Suicide/Harm Yourself At This time? No   Have you Recently Had Thoughts About Hurting Someone [name]? No  Are You Planning to Harm Someone at This Time? No  Explanation: No data recorded  Have You Used Any Alcohol or Drugs in the Past 24 Hours? No  How Long Ago Did You Use Drugs or  Alcohol? No data recorded What Did You Use and How Much? No data recorded  Do You Currently Have a Therapist/Psychiatrist? No  Name of Therapist/Psychiatrist: No data recorded  Have You Been Recently Discharged From Any Office Practice or Programs? No  Explanation of Discharge From Practice/Program: No data recorded   CCA Screening Triage Referral Assessment Type of Contact: Face-to-Face  Telemedicine Service Delivery:   Is this Initial or Reassessment?   Date Telepsych consult ordered in [facility]:    Time Telepsych consult ordered in [facility]:    Location of Assessment: [facility] ED  Provider Location: [facility] ED    Collateral Involvement: [facility] ([phone])   Does Patient Have a Automotive engineer Guardian? No data recorded Name and Contact of Legal Guardian: No data recorded If Minor and Not Living with Parent(s), Who has Custody? No data recorded Is CPS involved or ever been involved? Never  Is APS involved or ever been involved? Never   Patient Determined To Be At Risk for Harm To Self or Others Based on Review of Patient Reported Information or Presenting Complaint? No  Method: No data recorded Availability of Means: No data recorded Intent: No data recorded Notification Required: No data recorded Additional Information for Danger to Others Potential: No data recorded Additional Comments for Danger to Others Potential: No data recorded Are There Guns or Other Weapons in Your Home? No  Types of Guns/Weapons: No data recorded Are These Weapons Safely Secured?  No  Who Could Verify You Are Able To Have These Secured: No data recorded Do You Have any Outstanding Charges, Pending Court Dates, Parole/Probation? No data recorded Contacted To Inform of Risk of Harm To Self or Others: No data recorded  Does Patient Present under Involuntary Commitment? No    [place] of Residence: [place]   Patient Currently Receiving the Following  Services: Not Receiving Services   Determination of Need: Emergent (2 hours)   Options For Referral: ED Visit   Disposition Recommendation per psychiatric provider: There are no psychiatric contraindications to discharge at this time  Lilyan Gilford MS, LCAS, [facility], NCC Therapeutic Triage Specialist 07/03/2023 1:50 AM

## 2023-10-22 ENCOUNTER — Emergency Department (HOSPITAL_COMMUNITY)
Admission: EM | Admit: 2023-10-22 | Discharge: 2023-10-22 | Disposition: A | Discharge status: Home / Self Care | Source: Skilled Nursing Facility | Attending: Emergency Medicine | Admitting: Emergency Medicine

## 2023-10-22 ENCOUNTER — Emergency Department (HOSPITAL_COMMUNITY)

## 2023-10-22 ENCOUNTER — Encounter (HOSPITAL_COMMUNITY): Payer: Self-pay

## 2023-10-22 DIAGNOSIS — I5032 Chronic diastolic (congestive) heart failure: Secondary | ICD-10-CM | POA: Diagnosis not present

## 2023-10-22 DIAGNOSIS — E039 Hypothyroidism, unspecified: Secondary | ICD-10-CM | POA: Insufficient documentation

## 2023-10-22 DIAGNOSIS — Z853 Personal history of malignant neoplasm of breast: Secondary | ICD-10-CM | POA: Insufficient documentation

## 2023-10-22 DIAGNOSIS — Z23 Encounter for immunization: Secondary | ICD-10-CM | POA: Diagnosis not present

## 2023-10-22 DIAGNOSIS — Z79899 Other long term (current) drug therapy: Secondary | ICD-10-CM | POA: Insufficient documentation

## 2023-10-22 DIAGNOSIS — N184 Chronic kidney disease, stage 4 (severe): Secondary | ICD-10-CM | POA: Diagnosis not present

## 2023-10-22 DIAGNOSIS — J449 Chronic obstructive pulmonary disease, unspecified: Secondary | ICD-10-CM | POA: Insufficient documentation

## 2023-10-22 DIAGNOSIS — W01198A Fall on same level from slipping, tripping and stumbling with subsequent striking against other object, initial encounter: Secondary | ICD-10-CM | POA: Diagnosis not present

## 2023-10-22 DIAGNOSIS — S0083XA Contusion of other part of head, initial encounter: Secondary | ICD-10-CM | POA: Diagnosis not present

## 2023-10-22 DIAGNOSIS — I130 Hypertensive heart and chronic kidney disease with heart failure and stage 1 through stage 4 chronic kidney disease, or unspecified chronic kidney disease: Secondary | ICD-10-CM | POA: Insufficient documentation

## 2023-10-22 DIAGNOSIS — Z96653 Presence of artificial knee joint, bilateral: Secondary | ICD-10-CM | POA: Diagnosis not present

## 2023-10-22 DIAGNOSIS — I480 Paroxysmal atrial fibrillation: Secondary | ICD-10-CM | POA: Insufficient documentation

## 2023-10-22 DIAGNOSIS — W19XXXA Unspecified fall, initial encounter: Secondary | ICD-10-CM

## 2023-10-22 DIAGNOSIS — Z7901 Long term (current) use of anticoagulants: Secondary | ICD-10-CM | POA: Diagnosis not present

## 2023-10-22 DIAGNOSIS — S51812A Laceration without foreign body of left forearm, initial encounter: Secondary | ICD-10-CM | POA: Diagnosis not present

## 2023-10-22 DIAGNOSIS — F0390 Unspecified dementia without behavioral disturbance: Secondary | ICD-10-CM | POA: Insufficient documentation

## 2023-10-22 DIAGNOSIS — M25551 Pain in right hip: Secondary | ICD-10-CM | POA: Diagnosis not present

## 2023-10-22 DIAGNOSIS — S59912A Unspecified injury of left forearm, initial encounter: Secondary | ICD-10-CM | POA: Diagnosis present

## 2023-10-22 LAB — COMPREHENSIVE METABOLIC PANEL WITH GFR
ALT: 15 U/L (ref 0–44)
AST: 24 U/L (ref 15–41)
Albumin: 3.9 g/dL (ref 3.5–5.0)
Alkaline Phosphatase: 43 U/L (ref 38–126)
Anion gap: 14 (ref 5–15)
BUN: 21 mg/dL (ref 8–23)
CO2: 23 mmol/L (ref 22–32)
Calcium: 9.6 mg/dL (ref 8.9–10.3)
Chloride: 102 mmol/L (ref 98–111)
Creatinine, Ser: 1.41 mg/dL — ABNORMAL HIGH (ref 0.44–1.00)
GFR, Estimated: 36 mL/min — ABNORMAL LOW (ref >60)
Glucose, Bld: 90 mg/dL (ref 70–99)
Potassium: 3.8 mmol/L (ref 3.5–5.1)
Sodium: 139 mmol/L (ref 135–145)
Total Bilirubin: 0.5 mg/dL (ref 0.0–1.2)
Total Protein: 6.2 g/dL — ABNORMAL LOW (ref 6.5–8.1)

## 2023-10-22 LAB — CBC
HCT: 42.1 % (ref 36.0–46.0)
Hemoglobin: 13.8 g/dL (ref 12.0–15.0)
MCH: 33.4 pg (ref 26.0–34.0)
MCHC: 32.8 g/dL (ref 30.0–36.0)
MCV: 101.9 fL — ABNORMAL HIGH (ref 80.0–100.0)
Platelets: 151 K/uL (ref 150–400)
RBC: 4.13 MIL/uL (ref 3.87–5.11)
RDW: 12 % (ref 11.5–15.5)
WBC: 6 K/uL (ref 4.0–10.5)
nRBC: 0 % (ref 0.0–0.2)

## 2023-10-22 LAB — I-STAT CHEM 8, ED
BUN: 23 mg/dL (ref 8–23)
Calcium, Ion: 1.16 mmol/L (ref 1.15–1.40)
Chloride: 102 mmol/L (ref 98–111)
Creatinine, Ser: 1.5 mg/dL — ABNORMAL HIGH (ref 0.44–1.00)
Glucose, Bld: 87 mg/dL (ref 70–99)
HCT: 41 % (ref 36.0–46.0)
Hemoglobin: 13.9 g/dL (ref 12.0–15.0)
Potassium: 3.7 mmol/L (ref 3.5–5.1)
Sodium: 140 mmol/L (ref 135–145)
TCO2: 26 mmol/L (ref 22–32)

## 2023-10-22 LAB — SAMPLE TO BLOOD BANK

## 2023-10-22 LAB — PROTIME-INR
INR: 1.2 (ref 0.8–1.2)
Prothrombin Time: 15.5 s — ABNORMAL HIGH (ref 11.4–15.2)

## 2023-10-22 LAB — I-STAT CG4 LACTIC ACID, ED: Lactic Acid, Venous: 1.1 mmol/L (ref 0.5–1.9)

## 2023-10-22 LAB — ETHANOL: Alcohol, Ethyl (B): <15 mg/dL (ref <15)

## 2023-10-22 MED ORDER — TETANUS-DIPHTH-ACELL PERTUSSIS 5-2.5-18.5 LF-MCG/0.5 IM SUSY: 0.5000 mL | PREFILLED_SYRINGE | Freq: Once | INTRAMUSCULAR | Status: AC

## 2023-10-22 MED ORDER — TETANUS-DIPHTH-ACELL PERTUSSIS 5-2.5-18.5 LF-MCG/0.5 IM SUSY
PREFILLED_SYRINGE | INTRAMUSCULAR | Status: AC
Start: 2023-10-22 — End: 2023-10-22
  Administered 2023-10-22: 0.5 mL via INTRAMUSCULAR
  Filled 2023-10-22: qty 0.5

## 2023-10-22 NOTE — ED Provider Notes (Signed)
 [place] [facility]ERGENCY DEPARTMENT AT [facility] Provider Note  CSN: [id number] Arrival date & time: 10/22/23 1759  Chief Complaint(s) No chief complaint on file.  HPI [name] is a 89 y.o. female with past medical history as below, significant for HFpEF, A-fib on DOAC, COPD, PAD, who presents to the ED with complaint of fall, head injury   Patient arrives by EMS from facility secondary to trip and fall with head injury.  Patient was attempting to exit from elevator, her foot caught the lip of the elevator and she fell and hit her head on the ground.  No LOC, no thinners.  Landed on her right side.  Reports pain to her right hip, she does not seem to ambulate since the fall.  She is on Eliquis  and aspirin .  She has history of dementia, she is currently at baseline as relayed per EMS from staff at facility.  Past Medical History Past Medical History:  Diagnosis Date   (HFpEF) heart failure with preserved ejection fraction (HCC)    a.) TTE 12/2015: EF 60-65%. no rwma, G2DD, triv AI, mild MR, mildly dil LA. Mild-mod TR. PASP ; b.) TTE 03/01/2018: EF 55-60%, no rwma, mild MR, mildly dil LA, nl PASP. c.) TTE 10/26/2020: EF >55%; mild RA and mod LA enlargement. Triv-mild panvalvular regurgitation.   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   Edema    FEET/ANKLES   GERD (gastroesophageal reflux disease)    History of sepsis    History of stress test    a. 08/2010: EF 73%, no ischemia/infarct.   Hyperlipidemia    Hypertension    Hypothyroid    Lumbar disc disease    Mixed Alzheimer's and vascular dementia (HCC)    PAD (peripheral artery disease) (HCC)    a. 08/2015 s/p PTA of R AT w/ drug-coated balloon angioplasty to R Popliteal; b. 05/2017 ABI: R 1.16, L 0.88-->stable.   PAF (paroxysmal atrial fibrillation) (HCC)    a.) CHA2DS2-VASc = 8 (age x 2, sex, HFpEF, HTN, DVT x2, PVD). b.) rate/rhythm maintained on amiodarone  + metoprolol  succinate; chronically anticoagulated with low dose apixaban  + ASA. c.) s/p DCCV 08/26/2010 and 11/10/2011   PONV (postoperative nausea and vomiting)    nausea vomiting long ago with surgery but not recent ones   RBBB (right bundle branch block)    Patient Active Problem List   Diagnosis Date Noted   Closed fracture of left distal femur (HCC) 12/20/2021   Slurred speech 12/20/2021   Thrombocytopenia (HCC) 12/20/2021   Closed left hip fracture (HCC) 01/17/2021   Left elbow fracture 01/17/2021   Syncope and collapse 12/20/2020   Acute deep  vein thrombosis (DVT) of left peroneal vein (HCC) 03/06/2018   Arterial leg ulcer (HCC) 03/06/2018   Hypertensive heart and kidney disease with chronic diastolic congestive heart failure and stage 3 chronic kidney disease (HCC) 03/06/2018   GERD without esophagitis 03/06/2018   CKD (chronic kidney disease) stage 3, GFR 30-59 ml/min (HCC) 03/06/2018   Anemia 03/06/2018   Hypothyroidism due to acquired atrophy of thyroid  03/06/2018   Dyslipidemia 03/06/2018   Primary osteoarthritis of left knee 03/06/2018   Status post total knee replacement using cement, left 02/26/2018   Orthostatic hypotension 01/14/2016   ARF (acute renal failure) (HCC) 01/14/2016   Dehydration 01/14/2016   Protein-calorie malnutrition, severe 01/12/2016   Ductal  carcinoma in situ (DCIS) of right breast 09/01/2015   PAD (peripheral artery disease) (HCC) 08/10/2014   Chronic diastolic congestive heart failure (HCC) 02/26/2014   Renal insufficiency 09/14/2011   PAF (paroxysmal atrial fibrillation) (HCC) 08/09/2010   Hypertension 08/09/2010   Home Medication(s) Prior to Admission medications   Medication Sig Start Date End Date Taking? Authorizing Provider  acetaminophen  (TYLENOL ) 500 MG tablet Take 500 mg by mouth 2 (two) times daily.    [provider]  buPROPion (WELLBUTRIN) 75 MG tablet Take 75 mg by mouth in the morning. 06/05/23 06/04/24  [provider]  busPIRone  (BUSPAR ) 10 MG tablet Take 10 mg by mouth 2 (two) times daily. 11/28/21   [provider]  calcium  carbonate (TUMS EX) 750 MG chewable tablet Chew 2 tablets by mouth every 6 (six) hours as needed.    [provider]  cetirizine (ZYRTEC) 5 MG tablet Take 5 mg by mouth daily.    [provider]  Cholecalciferol  (VITAMIN D3) 50 MCG (2000 UT) TABS Take 2,000 Units by mouth daily.    [provider]  ELIQUIS  2.5 MG TABS tablet Take 2.5 mg by mouth 2 (two) times daily.    [provider]  escitalopram (LEXAPRO) 10 MG tablet Take 10 mg by mouth daily.    [provider]  HYDROcodone -acetaminophen  (NORCO) 7.5-325 MG tablet Take 1 tablet by mouth 2 (two) times daily as needed (pain). 06/27/23   [provider]  HYDROcodone -acetaminophen  (NORCO) 7.5-325 MG tablet Take 1 tablet by mouth daily.    [provider]  HYDROcodone -acetaminophen  (NORCO/VICODIN) 5-325 MG tablet Take 1 tablet by mouth every 6 (six) hours as needed. Patient not taking: Reported on 07/03/2023 12/22/21   Laurita Pillion, MD  levothyroxine  (SYNTHROID ) 100 MCG tablet Take 100 mcg by mouth daily before breakfast. Take on an empty stomach with a glass of water at least 30 to 60 minutes before breakfast. 02/13/23 02/13/24  [provider]   levothyroxine  (SYNTHROID ) 125 MCG tablet Take 125 mcg by mouth daily with breakfast. Patient not taking: Reported on 07/02/2023 05/06/21   [provider]  metoprolol  succinate (TOPROL -XL) 25 MG 24 hr tablet Take 25 mg by mouth daily.    [provider]  Multiple Vitamins-Minerals (CENTROVITE) TABS Take 1 tablet by mouth daily. 03/04/18   [provider]  Psyllium 0.36 g CAPS Take 1 capsule by mouth 2 (two) times daily.    [provider]  sertraline  (ZOLOFT ) 50 MG tablet Take 50 mg by mouth daily. 06/30/23   [provider]  spironolactone  (ALDACTONE ) 25 MG tablet Take 1 tablet (25 mg total) by mouth daily. 03/19/18   Landy Barnie RAMAN, NP  Past Surgical History Past Surgical History:  Procedure Laterality Date   ABDOMINAL EXPLORATION SURGERY     had to go back in after hysterectomy & check on stitch dr had put near my bladder; don't know if they took it out; had to wear catheter for 1 month   ABDOMINAL HYSTERECTOMY     with BSO   APPENDECTOMY     BACK SURGERY     BALLOON ANGIOPLASTY, ARTERY Right 09/08/2015   superficial femoral   BREAST EXCISIONAL BIOPSY Right 2007   positive   BREAST SURGERY     CARDIOVERSION  11/10/2011   Procedure: CARDIOVERSION;  Surgeon: Redell GORMAN Shallow, MD;  Location: [facility] OR;  Service: Cardiovascular;  Laterality: N/A;   CARDIOVERSION  08/26/2010   CATARACT EXTRACTION W/PHACO Right 07/17/2017   Procedure: CATARACT EXTRACTION PHACO AND INTRAOCULAR LENS PLACEMENT (IOC);  Surgeon: Jaye Fallow, MD;  Location: [facility] ORS;  Service: Ophthalmology;  Laterality: Right;  US  00:44.0 AP% 14.6 CDE 6.44 FLUID PACK LOT # [id number]   CATARACT EXTRACTION W/PHACO Left 08/07/2017   Procedure: CATARACT EXTRACTION PHACO AND INTRAOCULAR LENS PLACEMENT (IOC);  Surgeon: Jaye Fallow, MD;  Location: [facility]  ORS;  Service: Ophthalmology;  Laterality: Left;  US  00:44 AP% 14.1 CDE 6.32 Fluid pack lot # [id number]   COLONOSCOPY WITH PROPOFOL  N/A 01/04/2016   Procedure: COLONOSCOPY WITH PROPOFOL ;  Surgeon: Gladis RAYMOND Mariner, MD;  Location: [facility] ENDOSCOPY;  Service: Endoscopy;  Laterality: N/A;   HALLUX VALGUS CORRECTION     HARDWARE REMOVAL Left 03/18/2021   Procedure: HARDWARE REMOVAL;  Surgeon: Leora Lynwood SAUNDERS, MD;  Location: [facility] ORS;  Service: Orthopedics;  Laterality: Left;   HIP CLOSED REDUCTION Left 01/19/2021   Procedure: CLOSED REDUCTION HIP;  Surgeon: Leora Lynwood SAUNDERS, MD;  Location: [facility] ORS;  Service: Orthopedics;  Laterality: Left;   KYPHOPLASTY N/A 06/25/2018   Procedure: KYPHOPLASTY L3;  Surgeon: Kathlynn Sharper, MD;  Location: [facility] ORS;  Service: Orthopedics;  Laterality: N/A;   LUMBAR DISC SURGERY     MASTECTOMY Right 2013   positive   ORIF ELBOW FRACTURE Left 01/19/2021   Procedure: OPEN REDUCTION INTERNAL FIXATION (ORIF) ELBOW/OLECRANON FRACTURE;  Surgeon: Leora Lynwood SAUNDERS, MD;  Location: [facility] ORS;  Service: Orthopedics;  Laterality: Left;   PERCUTANEOUS PINNING Left 01/19/2021   Procedure: PERCUTANEOUS PINNING EXTREMITY;  Surgeon: Leora Lynwood SAUNDERS, MD;  Location: [facility] ORS;  Service: Orthopedics;  Laterality: Left;   PERIPHERAL VASCULAR CATHETERIZATION N/A 09/08/2015   Procedure: Abdominal Aortogram w/Lower Extremity;  Surgeon: Deatrice DELENA Cage, MD;  Location: [facility] INVASIVE CV LAB;  Service: Cardiovascular;  Laterality: N/A;   PERIPHERAL VASCULAR CATHETERIZATION  09/08/2015   Procedure: Peripheral Vascular Balloon Angioplasty;  Surgeon: Deatrice DELENA Cage, MD;  Location: [facility] INVASIVE CV LAB;  Service: Cardiovascular;;   REVISION TOTAL KNEE ARTHROPLASTY Right    rt.ankle ulcer surgery  2013   TEMPOROMANDIBULAR JOINT ARTHROPLASTY     TMJ ARTHROPLASTY     TONSILLECTOMY     TOTAL KNEE ARTHROPLASTY Right    TOTAL KNEE ARTHROPLASTY Left 02/26/2018   Procedure: TOTAL KNEE ARTHROPLASTY;   Surgeon: Edie Norleen PARAS, MD;  Location: [facility] ORS;  Service: Orthopedics;  Laterality: Left;   Family History Family History  Problem Relation Age of Onset   Heart attack Father    Coronary artery disease Sister 11       MI   Breast cancer Maternal Aunt     Social History Social History   Tobacco Use   Smoking status: Never  Smokeless tobacco: Never  Vaping Use   Vaping status: Never Used  Substance Use Topics   Alcohol use: No   Drug use: No   Allergies Other, Amlodipine , Ciprofloxacin, Doxycycline , Lipitor [atorvastatin], Morphine  and codeine, Macrobid [nitrofurantoin macrocrystal], Penicillins, and Sulfa antibiotics  Review of Systems A thorough review of systems was obtained and all systems are negative except as noted in the HPI and PMH.   Physical Exam Vital Signs  I have reviewed the triage vital signs BP (!) 162/73   Pulse (!) 105   Temp (!) 97.5 F (36.4 C) (Oral)   Resp 20   Ht 5' 8 (1.727 m)   Wt 59 kg   SpO2 96%   BMI 19.78 kg/m  Physical Exam Vitals and nursing note reviewed.  Constitutional:      General: She is not in acute distress.    Appearance: Normal appearance.  HENT:     Head: Normocephalic.     Comments: Contusion right    Right Ear: External ear normal.     Left Ear: External ear normal.     Nose: Nose normal.     Mouth/Throat:     Mouth: Mucous membranes are moist.  Eyes:     General: No scleral icterus.       Right eye: No discharge.        Left eye: No discharge.     Extraocular Movements: Extraocular movements intact.     Pupils: Pupils are equal, round, and reactive to light.  Cardiovascular:     Rate and Rhythm: Normal rate and regular rhythm.     Pulses: Normal pulses.     Heart sounds: Normal heart sounds.  Pulmonary:     Effort: Pulmonary effort is normal. No respiratory distress.     Breath sounds: Normal breath sounds. No stridor.  Abdominal:     General: Abdomen is flat. There is no distension.     Palpations:  Abdomen is soft.     Tenderness: There is no abdominal tenderness.  Musculoskeletal:     Cervical back: No rigidity.     Right lower leg: No edema.     Left lower leg: No edema.       Legs:     Comments: LE NVI Some pain with palpation of right hip/pelvis, no significant pain with logroll of the lower extremity.  Pelvis stable to AP pressure  Skin:    General: Skin is warm and dry.     Capillary Refill: Capillary refill takes less than 2 seconds.      Neurological:     Mental Status: She is alert. Mental status is at baseline.     GCS: GCS eye subscore is 4. GCS verbal subscore is 4. GCS motor subscore is 6.     Cranial Nerves: Cranial nerves 2-12 are intact.     Sensory: Sensation is intact.     Motor: Motor function is intact.     Coordination: Coordination is intact.     Comments: Gait testing deferred secondary to patient safety. Strength 5/5 to BLUE/BLLE, equal and symmetric    Psychiatric:        Mood and Affect: Mood normal.        Behavior: Behavior normal. Behavior is cooperative.     ED Results and Treatments Labs (all labs ordered are listed, but only abnormal results are displayed) Labs Reviewed  COMPREHENSIVE METABOLIC PANEL WITH GFR - Abnormal; Notable for the following components:      Result  Value   Creatinine, Ser 1.41 (*)    Total Protein 6.2 (*)    GFR, Estimated 36 (*)    All other components within normal limits  CBC - Abnormal; Notable for the following components:   MCV 101.9 (*)    All other components within normal limits  PROTIME-INR - Abnormal; Notable for the following components:   Prothrombin Time 15.5 (*)    All other components within normal limits  I-STAT CHEM 8, ED - Abnormal; Notable for the following components:   Creatinine, Ser 1.50 (*)    All other components within normal limits  ETHANOL  URINALYSIS, ROUTINE W REFLEX MICROSCOPIC  I-STAT CG4 LACTIC ACID, ED  SAMPLE TO BLOOD BANK                                                                                                                           Radiology DG Pelvis 1-2 Views Result Date: 10/22/2023 CLINICAL DATA:  trauma EXAM: PELVIS - 1-2 VIEW COMPARISON:  None Available. FINDINGS: Three screw fixation of the left femoral neck. No acute displaced fracture or dislocation of the right hip. No acute displaced fracture or diastasis of the bones of the pelvis. Degenerative changes of the lower lumbar spine with kyphoplasty at the L3 level. Degenerative changes of visualized lower lumbar spine. No pelvic bone lesions are seen. IMPRESSION: Negative for acute traumatic injury. Electronically Signed   By: Morgane  Naveau M.D.   On: 10/22/2023 19:32   DG Femur Portable Min 2 Views Right Result Date: 10/22/2023 CLINICAL DATA:  trauma EXAM: RIGHT FEMUR PORTABLE 2 VIEW COMPARISON:  None Available. FINDINGS: There is no evidence of fracture or other focal bone lesions. Soft tissues are unremarkable. Partially visualized total right knee arthroplasty. Vascular calcification. IMPRESSION: Negative for acute traumatic injury. Electronically Signed   By: Morgane  Naveau M.D.   On: 10/22/2023 19:31   DG Forearm Left Result Date: 10/22/2023 CLINICAL DATA:  fall EXAM: LEFT FOREARM - 2 VIEW COMPARISON:  None Available. FINDINGS: There is no evidence of fracture or other focal bone lesions. Degenerative changes of the wrist. Soft tissues are unremarkable. IMPRESSION: No acute displaced fracture or dislocation. Electronically Signed   By: Morgane  Naveau M.D.   On: 10/22/2023 19:29   CT Head Wo Contrast Result Date: 10/22/2023 CLINICAL DATA:  Neck trauma (Age >= 65y); Head trauma, minor (Age >= 65y) EXAM: CT HEAD WITHOUT CONTRAST CT CERVICAL SPINE WITHOUT CONTRAST TECHNIQUE: Multidetector CT imaging of the head and cervical spine was performed following the standard protocol without intravenous contrast. Multiplanar CT image reconstructions of the cervical spine were also generated. RADIATION DOSE  REDUCTION: This exam was performed according to the departmental dose-optimization program which includes automated exposure control, adjustment of the mA and/or kV according to patient size and/or use of iterative reconstruction technique. COMPARISON:  None Available. FINDINGS: CT HEAD FINDINGS Brain: Cerebral ventricle sizes are concordant with the degree of cerebral volume loss. Patchy and confluent areas  of decreased attenuation are noted throughout the deep and periventricular white matter of the cerebral hemispheres bilaterally, compatible with chronic microvascular ischemic disease. No evidence of large-territorial acute infarction. No parenchymal hemorrhage. No mass lesion. No extra-axial collection. No mass effect or midline shift. No hydrocephalus. Basilar cisterns are patent. Vascular: No hyperdense vessel. Skull: No acute fracture or focal lesion. Sinuses/Orbits: Paranasal sinuses and mastoid air cells are clear. Bilateral lens replacement. Otherwise the orbits are unremarkable. Other: 3 mm left frontal scalp hematoma. CT CERVICAL SPINE FINDINGS Alignment: Normal. Skull base and vertebrae: No acute fracture. No aggressive appearing focal osseous lesion or focal pathologic process. Soft tissues and spinal canal: No prevertebral fluid or swelling. No visible canal hematoma. Upper chest: Biapical pleural/pulmonary scarring. Other: Atherosclerotic plaque of the carotid arteries within the neck. IMPRESSION: 1. No acute intracranial abnormality. 2. No acute displaced fracture or traumatic listhesis of the cervical spine. Electronically Signed   By: Morgane  Naveau M.D.   On: 10/22/2023 19:28   CT Cervical Spine Wo Contrast Result Date: 10/22/2023 CLINICAL DATA:  Neck trauma (Age >= 65y); Head trauma, minor (Age >= 65y) EXAM: CT HEAD WITHOUT CONTRAST CT CERVICAL SPINE WITHOUT CONTRAST TECHNIQUE: Multidetector CT imaging of the head and cervical spine was performed following the standard protocol without  intravenous contrast. Multiplanar CT image reconstructions of the cervical spine were also generated. RADIATION DOSE REDUCTION: This exam was performed according to the departmental dose-optimization program which includes automated exposure control, adjustment of the mA and/or kV according to patient size and/or use of iterative reconstruction technique. COMPARISON:  None Available. FINDINGS: CT HEAD FINDINGS Brain: Cerebral ventricle sizes are concordant with the degree of cerebral volume loss. Patchy and confluent areas of decreased attenuation are noted throughout the deep and periventricular white matter of the cerebral hemispheres bilaterally, compatible with chronic microvascular ischemic disease. No evidence of large-territorial acute infarction. No parenchymal hemorrhage. No mass lesion. No extra-axial collection. No mass effect or midline shift. No hydrocephalus. Basilar cisterns are patent. Vascular: No hyperdense vessel. Skull: No acute fracture or focal lesion. Sinuses/Orbits: Paranasal sinuses and mastoid air cells are clear. Bilateral lens replacement. Otherwise the orbits are unremarkable. Other: 3 mm left frontal scalp hematoma. CT CERVICAL SPINE FINDINGS Alignment: Normal. Skull base and vertebrae: No acute fracture. No aggressive appearing focal osseous lesion or focal pathologic process. Soft tissues and spinal canal: No prevertebral fluid or swelling. No visible canal hematoma. Upper chest: Biapical pleural/pulmonary scarring. Other: Atherosclerotic plaque of the carotid arteries within the neck. IMPRESSION: 1. No acute intracranial abnormality. 2. No acute displaced fracture or traumatic listhesis of the cervical spine. Electronically Signed   By: Morgane  Naveau M.D.   On: 10/22/2023 19:28   DG Chest Portable 1 View Result Date: 10/22/2023 CLINICAL DATA:  trauma.  Fall EXAM: PORTABLE CHEST 1 VIEW COMPARISON:  Chest x-ray 08/22/2022, CT chest 08/22/2022 FINDINGS: The heart and mediastinal  contours are within normal limits. Atherosclerotic plaque including mitral annular calcification. No focal consolidation. No pulmonary edema. No pleural effusion. No pneumothorax. No acute osseous abnormality. Dextroscoliosis of the thoracic spine. IMPRESSION: 1. No active disease. 2. Aortic Atherosclerosis (ICD10-I70.0) including mitral annular calcification. Electronically Signed   By: Morgane  Naveau M.D.   On: 10/22/2023 19:25    Pertinent labs & imaging results that were available during my care of the patient were reviewed by me and considered in my medical decision making (see MDM for details).  Medications Ordered in ED Medications  Tdap (BOOSTRIX ) injection 0.5 mL (0.5  mLs Intramuscular Given 10/22/23 1808)                                                                                                                                     Procedures Procedures  (including critical care time)  Medical Decision Making / ED Course    Medical Decision Making:    Naijah Lacek Ramnauth is a 89 y.o. female with past medical history as below, significant for HFpEF, A-fib on DOAC, COPD, PAD, who presents to the ED with complaint of fall, head injury. The complaint involves an extensive differential diagnosis and also carries with it a high risk of complications and morbidity.  Serious etiology was considered. Ddx includes but is not limited to: Differential diagnoses for head trauma includes subdural hematoma, epidural hematoma, acute concussion, traumatic subarachnoid hemorrhage, cerebral contusions, etc.   Complete initial physical exam performed, notably the patient was in acute distress, sitting comfortably in stretcher.    Reviewed and confirmed nursing documentation for past medical history, family history, social history.  Vital signs reviewed.    FOT w/ head injury - Head injury on DOAC, CT head, CT cervical spine. - Level 2 trauma fall on thinners head injury.  Primary survey was completed,  airway intact, trachea midline, clear breath sounds bilateral, equal pulses to extremities and extremities are warm well-perfused.  GCS 14 > intermittent confusion.   - CT head/c-spine non-acute - pt feeling better, no headache  Skin tear left forearm >> - Skin tear left forearm, update tetanus, get x-ray, wound care - xr neg - wound cleaned/dressed by nurse   Right hip pain - Right hip pain, get x-ray hip/pelvis/femur. - imaging stable, ambulatory - doubt occult fx given weight bearing status   Clinical Course as of 10/22/23 2218  Mon Oct 22, 2023  1940 CT/ XR stable [SG]  2148 Feeling better, ambulatory [SG]    Clinical Course User Index [SG] Elnor Jayson LABOR, DO      She is feeling better, workup is stable.  Concussion precautions provided.  Will discharge back to SNF.  Patient in no distress and overall condition is stable. Detailed discussions were had with the patient/guardian regarding current findings, and need for close f/u with PCP or on call doctor. The patient/guardian has been instructed to return immediately if the symptoms worsen in any way for re-evaluation. Patient/guardian verbalized understanding and is in agreement with current care plan. All questions answered prior to discharge.               Additional history obtained: -Additional history obtained from ems -External records from outside source obtained and reviewed including: Chart review including previous notes, labs, imaging, consultation notes including  Home medications Prior er visit    Lab Tests: -I ordered, reviewed, and interpreted labs.   The pertinent results include:   Labs Reviewed  COMPREHENSIVE METABOLIC PANEL WITH GFR - Abnormal; Notable for the following  components:      Result Value   Creatinine, Ser 1.41 (*)    Total Protein 6.2 (*)    GFR, Estimated 36 (*)    All other components within normal limits  CBC - Abnormal; Notable for the following components:   MCV  101.9 (*)    All other components within normal limits  PROTIME-INR - Abnormal; Notable for the following components:   Prothrombin Time 15.5 (*)    All other components within normal limits  I-STAT CHEM 8, ED - Abnormal; Notable for the following components:   Creatinine, Ser 1.50 (*)    All other components within normal limits  ETHANOL  URINALYSIS, ROUTINE W REFLEX MICROSCOPIC  I-STAT CG4 LACTIC ACID, ED  SAMPLE TO BLOOD BANK    Notable for labs stable  EKG   EKG Interpretation Date/Time:    Ventricular Rate:    PR Interval:    QRS Duration:    QT Interval:    QTC Calculation:   R Axis:      Text Interpretation:           Imaging Studies ordered: I ordered imaging studies including CT head and C-spine, x-ray chest, pelvis, femur and forearm I independently visualized the following imaging with scope of interpretation limited to determining acute life threatening conditions related to emergency care; findings noted above I agree with the radiologist interpretation If any imaging was obtained with contrast I closely monitored patient for any possible adverse reaction a/w contrast administration in the emergency department   Medicines ordered and prescription drug management: Meds ordered this encounter  Medications   Tdap (BOOSTRIX ) 5-2.5-18.5 LF-MCG/0.5 injection    Woodard Bailey S: cabinet override   Tdap (BOOSTRIX ) injection 0.5 mL    -I have reviewed the patients home medicines and have made adjustments as needed   Consultations Obtained: na   Cardiac Monitoring: Continuous pulse oximetry interpreted by myself, 98% on RA.    Social Determinants of Health:  Diagnosis or treatment significantly limited by social determinants of health: dementia/snf   Reevaluation: After the interventions noted above, I reevaluated the patient and found that they have improved  Co morbidities that complicate the patient evaluation  Past Medical History:   Diagnosis Date   (HFpEF) heart failure with preserved ejection fraction (HCC)    a.) TTE 12/2015: EF 60-65%. no rwma, G2DD, triv AI, mild MR, mildly dil LA. Mild-mod TR. PASP ; b.) TTE 03/01/2018: EF 55-60%, no rwma, mild MR, mildly dil LA, nl PASP. c.) TTE 10/26/2020: EF >55%; mild RA and mod LA enlargement. Triv-mild panvalvular regurgitation.   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   Edema  FEET/ANKLES   GERD (gastroesophageal reflux disease)    History of sepsis    History of stress test    a. 08/2010: EF 73%, no ischemia/infarct.   Hyperlipidemia    Hypertension    Hypothyroid    Lumbar disc disease    Mixed Alzheimer's and vascular dementia (HCC)    PAD (peripheral artery disease) (HCC)    a. 08/2015 s/p PTA of R AT w/ drug-coated balloon angioplasty to R Popliteal; b. 05/2017 ABI: R 1.16, L 0.88-->stable.   PAF (paroxysmal atrial fibrillation) (HCC)    a.) CHA2DS2-VASc = 8 (age x 2, sex, HFpEF, HTN, DVT x2, PVD). b.) rate/rhythm maintained on amiodarone  + metoprolol  succinate; chronically anticoagulated with low dose apixaban  + ASA. c.) s/p DCCV 08/26/2010 and 11/10/2011   PONV (postoperative nausea and vomiting)    nausea vomiting long ago with surgery but not recent ones   RBBB (right bundle branch block)        Dispostion: Disposition decision including need for hospitalization was considered, and patient discharged from emergency department.    Final Clinical Impression(s) / ED Diagnoses Final diagnoses:  Skin tear of left forearm without complication, initial encounter  Fall, initial encounter  Anticoagulated        Elnor Jayson LABOR, DO 10/22/23 2218

## 2023-10-22 NOTE — ED Notes (Signed)
 This RN called and updated daughter Calarco

## 2023-10-22 NOTE — ED Notes (Signed)
 Daughter Jodie Gooch [phone] would like an update asap

## 2023-10-22 NOTE — ED Triage Notes (Signed)
 PT BIB [facility] after having a witnessed fall stepping out of the elevator,hit head, [facility] states PT takes eliquis . Endorses head and R side hip pain. Hx of Afib, Aox4.

## 2023-10-22 NOTE — ED Notes (Signed)
 [facility] called for pt to transport back to [facility]

## 2023-10-22 NOTE — Discharge Instructions (Addendum)
 It was a pleasure caring for you today in the emergency department.  Based on the events which brought you to the ER today, it is possible that you may have a concussion. A concussion occurs when there is a blow to the head or body, with enough force to shake the brain and disrupt how the brain functions. You may experience symptoms such as headaches, sensitivity to light/noise, dizziness, cognitive slowing, difficulty concentrating / remembering, trouble sleeping and drowsiness. These symptoms may last anywhere from hours/days to potentially weeks/months. While these symptoms are very frustrating and perhaps debilitating, it is important that you remember that they will improve over time. Everyone has a different rate of recovery; it is difficult to predict when your symptoms will resolve. In order to allow for your brain to heal after the injury, we recommend that you see your primary physician or a physician knowledgeable in concussion management. We also advise you to let your body and brain rest: avoid physical activities (sports, gym, and exercise) and reduce cognitive demands (reading, texting, TV watching, computer use, video games, etc). School attendance, after-school activities and work may need to be modified to avoid increasing symptoms. We recommend against driving until until all symptoms have resolved. Come back to the ER right away if you are having repeated episodes of vomiting, severe/worsening headache/dizziness or any other symptom that alarms you. We recommended that someone stay with you for the next 24 hours to monitor for these worrisome symptoms.   Please return to the emergency department for any worsening or worrisome symptoms.

## 2023-10-22 NOTE — Progress Notes (Signed)
   10/22/23 1812  Spiritual Encounters  Type of Visit Initial  Care provided to: Pt not available  Conversation partners present during Programmer, systems;Other (comment)  Reason for visit Routine spiritual support  OnCall Visit Yes   Responded to level II trauma - fot - patient transferred from NH in [place] due to fall, patient alert, no family present, patient having X-rays taken, possibly CT. Per nurse and EMT patient doing well - little confused, talking and responding to request. Chaplain not needed at this time.

## 2023-10-23 ENCOUNTER — Other Ambulatory Visit: Payer: Self-pay

## 2023-10-23 ENCOUNTER — Observation Stay
Admission: EM | Admit: 2023-10-23 | Discharge: 2023-10-25 | Disposition: A | Discharge status: Home / Self Care | Attending: Internal Medicine | Admitting: Internal Medicine

## 2023-10-23 ENCOUNTER — Emergency Department

## 2023-10-23 DIAGNOSIS — E034 Atrophy of thyroid (acquired): Secondary | ICD-10-CM | POA: Diagnosis present

## 2023-10-23 DIAGNOSIS — S0003XA Contusion of scalp, initial encounter: Secondary | ICD-10-CM | POA: Insufficient documentation

## 2023-10-23 DIAGNOSIS — S72002A Fracture of unspecified part of neck of left femur, initial encounter for closed fracture: Secondary | ICD-10-CM | POA: Diagnosis not present

## 2023-10-23 DIAGNOSIS — I5030 Unspecified diastolic (congestive) heart failure: Secondary | ICD-10-CM | POA: Insufficient documentation

## 2023-10-23 DIAGNOSIS — N1832 Chronic kidney disease, stage 3b: Secondary | ICD-10-CM | POA: Insufficient documentation

## 2023-10-23 DIAGNOSIS — Z853 Personal history of malignant neoplasm of breast: Secondary | ICD-10-CM | POA: Diagnosis not present

## 2023-10-23 DIAGNOSIS — R300 Dysuria: Secondary | ICD-10-CM | POA: Diagnosis not present

## 2023-10-23 DIAGNOSIS — I739 Peripheral vascular disease, unspecified: Secondary | ICD-10-CM | POA: Diagnosis present

## 2023-10-23 DIAGNOSIS — F39 Unspecified mood [affective] disorder: Secondary | ICD-10-CM | POA: Insufficient documentation

## 2023-10-23 DIAGNOSIS — Z8659 Personal history of other mental and behavioral disorders: Secondary | ICD-10-CM | POA: Insufficient documentation

## 2023-10-23 DIAGNOSIS — E785 Hyperlipidemia, unspecified: Secondary | ICD-10-CM | POA: Diagnosis present

## 2023-10-23 DIAGNOSIS — S32485A Nondisplaced dome fracture of left acetabulum, initial encounter for closed fracture: Principal | ICD-10-CM | POA: Insufficient documentation

## 2023-10-23 DIAGNOSIS — I700 Atherosclerosis of aorta: Secondary | ICD-10-CM | POA: Insufficient documentation

## 2023-10-23 DIAGNOSIS — I480 Paroxysmal atrial fibrillation: Secondary | ICD-10-CM | POA: Diagnosis not present

## 2023-10-23 DIAGNOSIS — I130 Hypertensive heart and chronic kidney disease with heart failure and stage 1 through stage 4 chronic kidney disease, or unspecified chronic kidney disease: Secondary | ICD-10-CM | POA: Diagnosis not present

## 2023-10-23 DIAGNOSIS — Z79899 Other long term (current) drug therapy: Secondary | ICD-10-CM | POA: Insufficient documentation

## 2023-10-23 DIAGNOSIS — S32592A Other specified fracture of left pubis, initial encounter for closed fracture: Secondary | ICD-10-CM | POA: Insufficient documentation

## 2023-10-23 DIAGNOSIS — D0511 Intraductal carcinoma in situ of right breast: Secondary | ICD-10-CM | POA: Diagnosis present

## 2023-10-23 DIAGNOSIS — D698 Other specified hemorrhagic conditions: Secondary | ICD-10-CM | POA: Insufficient documentation

## 2023-10-23 DIAGNOSIS — I5032 Chronic diastolic (congestive) heart failure: Secondary | ICD-10-CM | POA: Diagnosis present

## 2023-10-23 DIAGNOSIS — W010XXA Fall on same level from slipping, tripping and stumbling without subsequent striking against object, initial encounter: Secondary | ICD-10-CM | POA: Insufficient documentation

## 2023-10-23 DIAGNOSIS — D696 Thrombocytopenia, unspecified: Secondary | ICD-10-CM | POA: Diagnosis present

## 2023-10-23 DIAGNOSIS — M25552 Pain in left hip: Secondary | ICD-10-CM | POA: Diagnosis present

## 2023-10-23 DIAGNOSIS — S32409A Unspecified fracture of unspecified acetabulum, initial encounter for closed fracture: Secondary | ICD-10-CM | POA: Diagnosis present

## 2023-10-23 LAB — CBC WITH DIFFERENTIAL/PLATELET
Abs Immature Granulocytes: 0.05 K/uL (ref 0.00–0.07)
Basophils Absolute: 0 K/uL (ref 0.0–0.1)
Basophils Relative: 0 %
Eosinophils Absolute: 0 K/uL (ref 0.0–0.5)
Eosinophils Relative: 0 %
HCT: 38.5 % (ref 36.0–46.0)
Hemoglobin: 13 g/dL (ref 12.0–15.0)
Immature Granulocytes: 1 %
Lymphocytes Relative: 9 %
Lymphs Abs: 1 K/uL (ref 0.7–4.0)
MCH: 34.2 pg — ABNORMAL HIGH (ref 26.0–34.0)
MCHC: 33.8 g/dL (ref 30.0–36.0)
MCV: 101.3 fL — ABNORMAL HIGH (ref 80.0–100.0)
Monocytes Absolute: 1.3 K/uL — ABNORMAL HIGH (ref 0.1–1.0)
Monocytes Relative: 13 %
Neutro Abs: 8.1 K/uL — ABNORMAL HIGH (ref 1.7–7.7)
Neutrophils Relative %: 77 %
Platelets: 117 K/uL — ABNORMAL LOW (ref 150–400)
RBC: 3.8 MIL/uL — ABNORMAL LOW (ref 3.87–5.11)
RDW: 12.1 % (ref 11.5–15.5)
WBC: 10.5 K/uL (ref 4.0–10.5)
nRBC: 0 % (ref 0.0–0.2)

## 2023-10-23 LAB — BASIC METABOLIC PANEL WITH GFR
Anion gap: 11 (ref 5–15)
BUN: 21 mg/dL (ref 8–23)
CO2: 28 mmol/L (ref 22–32)
Calcium: 9.5 mg/dL (ref 8.9–10.3)
Chloride: 102 mmol/L (ref 98–111)
Creatinine, Ser: 1.22 mg/dL — ABNORMAL HIGH (ref 0.44–1.00)
GFR, Estimated: 43 mL/min — ABNORMAL LOW (ref >60)
Glucose, Bld: 136 mg/dL — ABNORMAL HIGH (ref 70–99)
Potassium: 3.7 mmol/L (ref 3.5–5.1)
Sodium: 141 mmol/L (ref 135–145)

## 2023-10-23 MED ORDER — ENSURE PLUS HIGH PROTEIN PO LIQD
237.0000 mL | Freq: Three times a day (TID) | ORAL | Status: DC
  Administered 2023-10-23 – 2023-10-25 (×6): 237 mL via ORAL

## 2023-10-23 MED ORDER — BUSPIRONE HCL 10 MG PO TABS
15.0000 mg | ORAL_TABLET | Freq: Two times a day (BID) | ORAL | Status: DC
  Administered 2023-10-23 – 2023-10-25 (×4): 15 mg via ORAL
  Filled 2023-10-23 (×4): qty 2

## 2023-10-23 MED ORDER — DOCUSATE SODIUM 100 MG PO CAPS
100.0000 mg | ORAL_CAPSULE | Freq: Two times a day (BID) | ORAL | Status: DC
  Administered 2023-10-23 – 2023-10-25 (×5): 100 mg via ORAL
  Filled 2023-10-23 (×5): qty 1

## 2023-10-23 MED ORDER — ADULT MULTIVITAMIN W/MINERALS CH
1.0000 | ORAL_TABLET | Freq: Every day | ORAL | Status: DC
  Administered 2023-10-23: 1
  Filled 2023-10-23: qty 1

## 2023-10-23 MED ORDER — ACETAMINOPHEN 500 MG PO TABS
500.0000 mg | ORAL_TABLET | Freq: Three times a day (TID) | ORAL | Status: DC
  Administered 2023-10-23 – 2023-10-25 (×7): 500 mg via ORAL
  Filled 2023-10-23 (×7): qty 1

## 2023-10-23 MED ORDER — METHOCARBAMOL 1000 MG/10ML IJ SOLN: 500.0000 mg | Freq: Four times a day (QID) | INTRAMUSCULAR | Status: DC | PRN

## 2023-10-23 MED ORDER — SODIUM CHLORIDE 0.9 % IV BOLUS
1000.0000 mL | Freq: Once | INTRAVENOUS | Status: AC
  Administered 2023-10-23: 1000 mL via INTRAVENOUS

## 2023-10-23 MED ORDER — ADULT MULTIVITAMIN W/MINERALS CH
1.0000 | ORAL_TABLET | Freq: Every day | ORAL | Status: DC
  Administered 2023-10-24 – 2023-10-25 (×2): 1 via ORAL
  Filled 2023-10-23 (×2): qty 1

## 2023-10-23 MED ORDER — MORPHINE SULFATE (PF) 2 MG/ML IV SOLN
2.0000 mg | Freq: Once | INTRAVENOUS | Status: AC
  Administered 2023-10-23: 2 mg via INTRAVENOUS
  Filled 2023-10-23: qty 1

## 2023-10-23 MED ORDER — MORPHINE SULFATE (PF) 2 MG/ML IV SOLN: 2.0000 mg | INTRAVENOUS | Status: DC | PRN

## 2023-10-23 MED ORDER — POLYETHYLENE GLYCOL 3350 17 G PO PACK: 17.0000 g | PACK | Freq: Every day | ORAL | Status: DC | PRN

## 2023-10-23 MED ORDER — ONDANSETRON HCL 4 MG/2ML IJ SOLN: 4.0000 mg | Freq: Four times a day (QID) | INTRAMUSCULAR | Status: DC | PRN

## 2023-10-23 MED ORDER — METOPROLOL SUCCINATE ER 25 MG PO TB24
25.0000 mg | ORAL_TABLET | Freq: Every day | ORAL | Status: DC
  Administered 2023-10-24 – 2023-10-25 (×2): 25 mg via ORAL
  Filled 2023-10-23 (×2): qty 1

## 2023-10-23 MED ORDER — HYDROCODONE-ACETAMINOPHEN 5-325 MG PO TABS
1.0000 | ORAL_TABLET | Freq: Four times a day (QID) | ORAL | Status: DC | PRN
  Administered 2023-10-24 (×2): 1 via ORAL
  Filled 2023-10-23 (×2): qty 1

## 2023-10-23 MED ORDER — METHOCARBAMOL 500 MG PO TABS: 500.0000 mg | ORAL_TABLET | Freq: Four times a day (QID) | ORAL | Status: DC | PRN

## 2023-10-23 MED ORDER — SERTRALINE HCL 50 MG PO TABS
50.0000 mg | ORAL_TABLET | Freq: Every day | ORAL | Status: DC
  Administered 2023-10-23 – 2023-10-25 (×3): 50 mg via ORAL
  Filled 2023-10-23 (×3): qty 1

## 2023-10-23 MED ORDER — LEVOTHYROXINE SODIUM 50 MCG PO TABS
100.0000 ug | ORAL_TABLET | Freq: Every day | ORAL | Status: DC
  Administered 2023-10-24 – 2023-10-25 (×2): 100 ug via ORAL
  Filled 2023-10-23 (×2): qty 2

## 2023-10-23 MED ORDER — ONDANSETRON HCL 4 MG/2ML IJ SOLN
4.0000 mg | Freq: Once | INTRAMUSCULAR | Status: AC
  Administered 2023-10-23: 4 mg via INTRAVENOUS
  Filled 2023-10-23: qty 2

## 2023-10-23 MED ORDER — APIXABAN 2.5 MG PO TABS
2.5000 mg | ORAL_TABLET | Freq: Two times a day (BID) | ORAL | Status: DC
  Administered 2023-10-24 – 2023-10-25 (×3): 2.5 mg via ORAL
  Filled 2023-10-23 (×3): qty 1

## 2023-10-23 NOTE — H&P (Signed)
 History and Physical  Patient: [name] FMW:[id number] DOB: 02-13-1933 DOA: 10/23/2023 DOS: the patient was seen and examined on 10/23/2023 Patient coming from: ALF/ILF  Chief Complaint: Left hip pain.  HPI: Patient with PMH of paroxysmal A-fib, chronic HFpEF, chronic thrombocytopenia, hypothyroidism, PAD, CKD 3B, HLD, right breast cancer presented to the hospital with complaints of a mechanical fall. Patient lives at an [facility]. She was stepping off of the elevator with a walker.  The walker tripped and she fell down.  She had a head injury on her left forehead area.  Was also complaining of pain left hip area.  She also had a skin tear on the left forearm area.  Patient was seen at [facility].  Reportedly patient was ambulatory after the fall. Underwent x-ray chest, x-ray pelvis, x-ray of femur right, x-ray left forearm, CT head as well as CT C-spine workup in the ED which was unremarkable for any acute fracture. Patient was sent back to her ALF. After reaching the facility she was not able to bear weight in the left hip etiology secondary to pain which is the reason she was brought back to the ED. At the time of my evaluation she reports that the pain is well-controlled when she is not moving but any movement causes severe pain.  No nausea no vomiting.  No fever no chills.  No similar falls recently.  No chest pain no abdominal pain.  No shortness of breath.  No focal deficits. She denies any change in her medications. She reports she is compliant with all her medications.  Assessment and Plan: Close nondisplaced fracture of left vestibular as well as left inferior pubic rami fracture. Presented with a mechanical fall with left hip pain. Unable to bear weight. CT left hip and femur shows evidence of above fractures. Dr. Edie orthopedics were consulted.  Appreciate assistance. Current recommendation is for nonsurgical management secondary to nondisplaced  appearance on the CT scan. Recommending weightbearing as tolerated on left leg using a walker. Will continue with pain control. PT OT consulted. Scheduled Tylenol .  Paroxysmal A-fib. Chronic thrombocytopenia. On Eliquis  2.5 mg twice daily. Currently appears to be rate controlled. On Toprol -XL at home. Will continue the same.  CKD 3B. Baseline serum creatinine appears to be around 1.4. Serum creatinine appears to be stable. Monitor.  HTN. Chronic HFpEF. Echo 2019 EF 55 to 60%. On Aldactone  at home. Currently volume status adequate. Will hold Aldactone .  Mood disorder. On BuSpar  and Zoloft . Med rec also includes Wellbutrin and Lexapro although it appears that the patient has not been prescribed this medication lately. For now we will continue only BuSpar  and Zoloft .  Reported history of dementia. Patient appears to be alert awake and oriented times now. Monitor for now.  Mechanical fall. Left scalp hematoma. CT scan stable. For now we will continue anticoagulation. At risk for concussion especially with a history of cerebral volume loss. Monitor for now.  History of breast cancer. Noted.   Advance Care Planning:   Code Status: Limited: Do not attempt resuscitation (DNR) -DNR-LIMITED -Do Not Intubate/DNI based on advanced directive and prior history Consults: Orthopedics  Prior to Admission medications   Medication Sig Start Date End Date Taking? Authorizing Provider  acetaminophen  (TYLENOL ) 500 MG tablet Take 500 mg by mouth 2 (two) times daily.    [provider]  buPROPion (WELLBUTRIN) 75 MG tablet Take 75 mg by mouth in the morning. 06/05/23 06/04/24  [provider]  busPIRone  (BUSPAR ) 10 MG tablet Take 10 mg by mouth 2 (two) times daily. 11/28/21   [provider]  calcium  carbonate (TUMS EX) 750 MG chewable tablet Chew 2 tablets by mouth every 6 (six) hours as needed.    [provider]  cetirizine (ZYRTEC) 5 MG tablet Take 5  mg by mouth daily.    [provider]  Cholecalciferol  (VITAMIN D3) 50 MCG (2000 UT) TABS Take 2,000 Units by mouth daily.    [provider]  ELIQUIS  2.5 MG TABS tablet Take 2.5 mg by mouth 2 (two) times daily.    [provider]  escitalopram (LEXAPRO) 10 MG tablet Take 10 mg by mouth daily.    [provider]  HYDROcodone -acetaminophen  (NORCO) 7.5-325 MG tablet Take 1 tablet by mouth 2 (two) times daily as needed (pain). 06/27/23   [provider]  HYDROcodone -acetaminophen  (NORCO) 7.5-325 MG tablet Take 1 tablet by mouth daily.    [provider]  HYDROcodone -acetaminophen  (NORCO/VICODIN) 5-325 MG tablet Take 1 tablet by mouth every 6 (six) hours as needed. Patient not taking: Reported on 07/03/2023 12/22/21   Laurita Pillion, MD  levothyroxine  (SYNTHROID ) 100 MCG tablet Take 100 mcg by mouth daily before breakfast. Take on an empty stomach with a glass of water at least 30 to 60 minutes before breakfast. 02/13/23 02/13/24  [provider]  levothyroxine  (SYNTHROID ) 125 MCG tablet Take 125 mcg by mouth daily with breakfast. Patient not taking: Reported on 07/02/2023 05/06/21   [provider]  metoprolol  succinate (TOPROL -XL) 25 MG 24 hr tablet Take 25 mg by mouth daily.    [provider]  Multiple Vitamins-Minerals (CENTROVITE) TABS Take 1 tablet by mouth daily. 03/04/18   [provider]  Psyllium 0.36 g CAPS Take 1 capsule by mouth 2 (two) times daily.    [provider]  sertraline  (ZOLOFT ) 50 MG tablet Take 50 mg by mouth daily. 06/30/23   [provider]  spironolactone  (ALDACTONE ) 25 MG tablet Take 1 tablet (25 mg total) by mouth daily. 03/19/18   Landy Barnie RAMAN, NP    Past Medical History:  Diagnosis Date   (HFpEF) heart failure with preserved ejection fraction (HCC)    a.) TTE 12/2015: EF 60-65%. no rwma, G2DD, triv AI, mild MR, mildly dil LA. Mild-mod TR. PASP ; b.) TTE  03/01/2018: EF 55-60%, no rwma, mild MR, mildly dil LA, nl PASP. c.) TTE 10/26/2020: EF >55%; mild RA and mod LA enlargement. Triv-mild panvalvular regurgitation.   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   Edema    FEET/ANKLES   GERD (gastroesophageal reflux disease)    History of sepsis  History of stress test    a. 08/2010: EF 73%, no ischemia/infarct.   Hyperlipidemia    Hypertension    Hypothyroid    Lumbar disc disease    Mixed Alzheimer's and vascular dementia (HCC)    PAD (peripheral artery disease) (HCC)    a. 08/2015 s/p PTA of R AT w/ drug-coated balloon angioplasty to R Popliteal; b. 05/2017 ABI: R 1.16, L 0.88-->stable.   PAF (paroxysmal atrial fibrillation) (HCC)    a.) CHA2DS2-VASc = 8 (age x 2, sex, HFpEF, HTN, DVT x2, PVD). b.) rate/rhythm maintained on amiodarone  + metoprolol  succinate; chronically anticoagulated with low dose apixaban  + ASA. c.) s/p DCCV 08/26/2010 and 11/10/2011   PONV (postoperative nausea and vomiting)    nausea vomiting long ago with surgery but not recent ones   RBBB (right bundle branch block)    Past Surgical History:  Procedure Laterality Date   ABDOMINAL EXPLORATION SURGERY     had to go back in after hysterectomy & check on stitch dr had put near my bladder; don't know if they took it  out; had to wear catheter for 1 month   ABDOMINAL HYSTERECTOMY     with BSO   APPENDECTOMY     BACK SURGERY     BALLOON ANGIOPLASTY, ARTERY Right 09/08/2015   superficial femoral   BREAST EXCISIONAL BIOPSY Right 2007   positive   BREAST SURGERY     CARDIOVERSION  11/10/2011   Procedure: CARDIOVERSION;  Surgeon: Redell GORMAN Shallow, MD;  Location: [facility] OR;  Service: Cardiovascular;  Laterality: N/A;   CARDIOVERSION  08/26/2010   CATARACT EXTRACTION W/PHACO Right 07/17/2017   Procedure: CATARACT EXTRACTION PHACO AND INTRAOCULAR LENS PLACEMENT (IOC);  Surgeon: Jaye Fallow, MD;  Location: [facility] ORS;  Service: Ophthalmology;  Laterality: Right;  US  00:44.0 AP% 14.6 CDE 6.44 FLUID PACK LOT # [id number]   CATARACT EXTRACTION W/PHACO Left 08/07/2017   Procedure: CATARACT EXTRACTION PHACO AND INTRAOCULAR LENS PLACEMENT (IOC);  Surgeon: Jaye Fallow, MD;  Location: [facility] ORS;  Service: Ophthalmology;  Laterality: Left;  US  00:44 AP% 14.1 CDE 6.32 Fluid pack lot # [id number]   COLONOSCOPY WITH PROPOFOL  N/A 01/04/2016   Procedure: COLONOSCOPY WITH PROPOFOL ;  Surgeon: Gladis RAYMOND Mariner, MD;  [facility];  Service: Endoscopy;  Laterality: N/A;   HALLUX VALGUS CORRECTION     HARDWARE REMOVAL Left 03/18/2021   Procedure: HARDWARE REMOVAL;  Surgeon: Leora Lynwood SAUNDERS, MD;  Location: [facility] ORS;  Service: Orthopedics;  Laterality: Left;   HIP CLOSED REDUCTION Left 01/19/2021   Procedure: CLOSED REDUCTION HIP;  Surgeon: Leora Lynwood SAUNDERS, MD;  Location: [facility] ORS;  Service: Orthopedics;  Laterality: Left;   KYPHOPLASTY N/A 06/25/2018   Procedure: KYPHOPLASTY L3;  Surgeon: Kathlynn Sharper, MD;  Location: [facility] ORS;  Service: Orthopedics;  Laterality: N/A;   LUMBAR DISC SURGERY     MASTECTOMY Right 2013   positive   ORIF ELBOW FRACTURE Left 01/19/2021   Procedure: OPEN REDUCTION INTERNAL FIXATION (ORIF) ELBOW/OLECRANON FRACTURE;  Surgeon: Leora Lynwood SAUNDERS, MD;  Location: [facility] ORS;  Service:  Orthopedics;  Laterality: Left;   PERCUTANEOUS PINNING Left 01/19/2021   Procedure: PERCUTANEOUS PINNING EXTREMITY;  Surgeon: Leora Lynwood SAUNDERS, MD;  Location: [facility] ORS;  Service: Orthopedics;  Laterality: Left;   PERIPHERAL VASCULAR CATHETERIZATION N/A 09/08/2015   Procedure: Abdominal Aortogram w/Lower Extremity;  Surgeon: Deatrice DELENA Cage, MD;  Location: [facility] IN[facility]ASIVE [facility] LAB;  Service: Cardiovascular;  Laterality: N/A;   PERIPHERAL VASCULAR CATHETERIZATION  09/08/2015   Procedure: Peripheral Vascular  Balloon Angioplasty;  Surgeon: Deatrice DELENA Cage, MD;  Location: [facility]VASIVE CV LAB;  Service: Cardiovascular;;   REVISION TOTAL KNEE ARTHROPLASTY Right    rt.ankle ulcer surgery  2013   TEMPOROMANDIBULAR JOINT ARTHROPLASTY     TMJ ARTHROPLASTY     TONSILLECTOMY     TOTAL KNEE ARTHROPLASTY Right    TOTAL KNEE ARTHROPLASTY Left 02/26/2018   Procedure: TOTAL KNEE ARTHROPLASTY;  Surgeon: Edie Norleen PARAS, MD;  Location: [facility] ORS;  Service: Orthopedics;  Laterality: Left;   Social History:  reports that she has never smoked. She has never used smokeless tobacco. She reports that she does not drink alcohol and does not use drugs. Allergies  Allergen Reactions   Other Other (See Comments)    NO BP, VENIPUNCTURE, OR ACCESS IN RIGHT ARM - S/P MASTECTOMY ON RIGHT   Amlodipine  Nausea And Vomiting   Ciprofloxacin Nausea Only   Doxycycline  Itching and Other (See Comments)    shaky   Lipitor [Atorvastatin] Other (See Comments)    Joint pain    Morphine  And Codeine Nausea And Vomiting   Macrobid [Nitrofurantoin Macrocrystal] Rash   Penicillins Rash and Other (See Comments)    Tolerated 1st generation cephalosporin (CEFAZOLIN ) on 08/20/2013 and 01/19/2021 without documented ADRs.  PCN reaction causing immediate rash, facial/tongue/throat swelling, SOB or lightheadedness with hypotension: yes PCN reaction causing severe rash involving mucus membranes or skin necrosis: no PCN reaction that required  hospitalization no PCN reaction occurring within the last 10 years: no If all of the above answers are NO, then may proceed with Cephalosporin use.    Sulfa Antibiotics Rash   Family History  Problem Relation Age of Onset   Heart attack Father    Coronary artery disease Sister 33       MI   Breast cancer Maternal Aunt    Physical Exam: Vitals:   10/23/23 0125 10/23/23 0348 10/23/23 0536 10/23/23 0829  BP: (!) 151/88 129/82  128/69  Pulse: (!) 124 (!) 114  (!) 129  Resp: 18 18  17   Temp: 97.7 F (36.5 C)  97.6 F (36.4 C) (!) 97.4 F (36.3 C)  TempSrc:   Oral Oral  SpO2: 98% 92%  94%  Weight:      Height:       General: Appear in mild distress; no visible Abnormal Neck Mass Or lumps, Conjunctiva normal Cardiovascular: S1 and S2 Present, no Murmur, Respiratory: good respiratory effort, Bilateral Air entry present and CTA, no Crackles, no wheezes Abdomen: Bowel Sound present, Non tender  Extremities: no Pedal edema Neurology: alert and oriented to time, place, and person Gait not checked due to patient safety concerns   Data Reviewed: I have reviewed ED notes, Vitals, Lab results and outpatient records. Since last encounter, pertinent lab results CBC and BMP   . I have ordered test including CBC and BMP  . I have discussed pt's care plan and test results with ED provider  .   Family Communication: No one at bedside  Author: Yetta Blanch, MD 10/23/2023 11:17 AM For on call review [email or web address].

## 2023-10-23 NOTE — Hospital Course (Signed)
 Patient with PMH of paroxysmal A-fib, chronic HFpEF, chronic thrombocytopenia, hypothyroidism, PAD, CKD 3B, HLD, right breast cancer presented to the hospital with complaints of a mechanical fall. Underwent x-ray chest, x-ray pelvis, x-ray of femur right, x-ray left forearm, CT head as well as CT C-spine workup in the ED which was unremarkable for any acute fracture.  CT of the hip showed nondisplaced fracture of left vestibular as well as left inferior pubic rami fracture .  Patient is seen by orthopedics, no need for surgery.  However, PT/OT recommend nursing home placement.  Patient is medically stable for discharge today.

## 2023-10-23 NOTE — Progress Notes (Signed)
 Initial Nutrition Assessment  DOCUMENTATION CODES:   Severe malnutrition in context of chronic illness  INTERVENTION:   -Continue regular diet -Ensure Plus High Protein po TID, each supplement provides 350 kcal and 20 grams of protein  -MVI with minerals daily  NUTRITION DIAGNOSIS:   Severe Malnutrition related to chronic illness (dementia, CHF) as evidenced by moderate fat depletion, severe fat depletion, moderate muscle depletion, severe muscle depletion, percent weight loss.  GOAL:   Patient will meet greater than or equal to 90% of their needs  MONITOR:   PO intake, Supplement acceptance  REASON FOR ASSESSMENT:   Consult Assessment of nutrition requirement/status, Hip fracture protocol  ASSESSMENT:   Pt with past medical history as below, significant for HFpEF, A-fib on DOAC, COPD, PAD, who presents with complaint of fall, head injury  Pt admitted with lt vestibular and lt pubic rami fracture.   Per orthopedics notes, plan for non-operative management.   Pt from [employer] [facility] [employer]. Reviewed records; pt on a regular diet PTA.   Spoke with pt at bedside, who was pleasant and in good spirits today. Pt's history limited secondary to dementia, however, was able to answer close ended questions appropriately. She was able to tell RD that she was in the hospital due to a fall and references her lt hip and leg as a source of pain.   Pt reports she has no chewing or swallowing difficulties and eats well PTA. Pt shares she drinks supplements at [facility] (the pink ones- which RD clarified to mean strawberry flavor).   Pt does not think she has lost weight or know her UBW. Reviewed wt hx; pt has experienced a 12.1% wt loss over the past 4 months, which is significant for time frame.   Discussed importance of good meal and supplement intake to promote healing. Pt amenable to supplements.   Medications reviewed and include colace.   Labs reviewed: CBGS: 120 (inpatient  orders for glycemic control are ).    NUTRITION - FOCUSED PHYSICAL EXAM:  Flowsheet Row Most Recent Value  Orbital Region Severe depletion  Upper Arm Region Severe depletion  Thoracic and Lumbar Region Severe depletion  Buccal Region Severe depletion  Temple Region Severe depletion  Clavicle Bone Region Severe depletion  Clavicle and Acromion Bone Region Severe depletion  Scapular Bone Region Severe depletion  Dorsal Hand Severe depletion  Patellar Region Moderate depletion  Anterior Thigh Region Moderate depletion  Posterior Calf Region Moderate depletion  Edema (RD Assessment) None  Hair Reviewed  Eyes Reviewed  Mouth Reviewed  Skin Reviewed  Nails Reviewed    Diet Order:   Diet Order             Diet regular Room service appropriate? Yes; Fluid consistency: Thin  Diet effective now                   EDUCATION NEEDS:   Education needs have been addressed  Skin:  Skin Assessment: Reviewed RN Assessment  Last BM:  Unknown  Height:   Ht Readings from Last 1 Encounters:  10/23/23 5' 8 (1.727 m)    Weight:   Wt Readings from Last 1 Encounters:  10/23/23 55.8 kg    Ideal Body Weight:  63.6 kg  BMI:  Body mass index is 18.7 kg/m.  Estimated Nutritional Needs:   Kcal:  1650-1850  Protein:  85-100 grams  Fluid:  1.6-1.8 L    Margery ORN, RD, LDN, CDCES Registered Dietitian III Certified Diabetes Care and Education  Specialist If unable to reach this RD, please use RD Inpatient group chat on secure chat between hours of 8am-4 pm daily

## 2023-10-23 NOTE — Consult Note (Signed)
 ORTHOPAEDIC CONSULTATION  REQUESTING PHYSICIAN: Tobie Yetta HERO, MD  Chief Complaint:   Left hip pain.  History of Present Illness: [name] is a 89 y.o. female with multiple medical problems including coronary artery disease, hypertension, hyperlipidemia, chronic kidney disease, breast cancer, gastroesophageal reflux disease, hypothyroidism, peripheral vascular disease, and dementia who is on Eliquis  for treatment of a prior DVT and who lives in an assisted living facility and normally ambulates with a walker.  The patient apparently lost her balance and fell at her facility when her foot caught on the lip of the elevator as she was leaving the elevator.  She landed on her hip and also struck her head.  She was brought to the [facility] emergency room where workup focused on evaluating her head.  This workup was unremarkable and the patient was discharged home.  However, she continued to complain of left hip pain so she was brought to the [facility] emergency room where CT scanning of the left femur was performed and demonstrated nondisplaced fractures of the anterior acetabular column as well as the left inferior pubic ramus.  Therefore, the patient was admitted for pain control and probable skilled nursing placement.  Past Medical History:  Diagnosis Date   (HFpEF) heart failure with preserved ejection fraction (HCC)    a.) TTE 12/2015: EF 60-65%. no rwma, G2DD, triv AI, mild MR, mildly dil LA. Mild-mod TR. PASP ; b.) TTE 03/01/2018: EF 55-60%, no rwma, mild MR, mildly dil LA, nl PASP. c.) TTE 10/26/2020: EF >55%; mild RA and mod LA enlargement. Triv-mild panvalvular regurgitation.   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   Edema    FEET/ANKLES   GERD (gastroesophageal reflux disease)    History of sepsis    History of stress test    a. 08/2010: EF 73%, no ischemia/infarct.   Hyperlipidemia    Hypertension    Hypothyroid    Lumbar disc disease    Mixed Alzheimer's and vascular dementia (HCC)    PAD (peripheral artery disease) (HCC)    a. 08/2015 s/p PTA of R AT w/ drug-coated balloon angioplasty to R Popliteal; b. 05/2017 ABI: R 1.16, L 0.88-->stable.   PAF (paroxysmal atrial fibrillation) (HCC)    a.) CHA2DS2-VASc = 8 (age x 2, sex, HFpEF, HTN, DVT x2, PVD). b.) rate/rhythm maintained on amiodarone  + metoprolol  succinate; chronically anticoagulated with low dose apixaban  + ASA. c.) s/p DCCV 08/26/2010 and 11/10/2011   PONV (postoperative nausea and vomiting)    nausea vomiting long ago with surgery but not recent ones   RBBB (right bundle branch block)    Past Surgical History:  Procedure Laterality Date  ABDOMINAL EXPLORATION SURGERY     had to go back in after hysterectomy & check on stitch dr had put near my bladder; don't know if they took it out; had to wear catheter for 1 month   ABDOMINAL HYSTERECTOMY     with BSO   APPENDECTOMY     BACK SURGERY     BALLOON ANGIOPLASTY, ARTERY Right 09/08/2015   superficial femoral   BREAST EXCISIONAL BIOPSY Right 2007   positive   BREAST SURGERY     CARDIOVERSION  11/10/2011   Procedure: CARDIOVERSION;  Surgeon: Redell GORMAN Shallow, MD;  Location: [facility] OR;  Service: Cardiovascular;  Laterality: N/A;    CARDIOVERSION  08/26/2010   CATARACT EXTRACTION W/PHACO Right 07/17/2017   Procedure: CATARACT EXTRACTION PHACO AND INTRAOCULAR LENS PLACEMENT (IOC);  Surgeon: Jaye Fallow, MD;  Location: [facility] ORS;  Service: Ophthalmology;  Laterality: Right;  US  00:44.0 AP% 14.6 CDE 6.44 FLUID PACK LOT # [id number]   CATARACT EXTRACTION W/PHACO Left 08/07/2017   Procedure: CATARACT EXTRACTION PHACO AND INTRAOCULAR LENS PLACEMENT (IOC);  Surgeon: Jaye Fallow, MD;  Location: [facility] ORS;  Service: Ophthalmology;  Laterality: Left;  US  00:44 AP% 14.1 CDE 6.32 Fluid pack lot # [id number]   COLONOSCOPY WITH PROPOFOL  N/A 01/04/2016   Procedure: COLONOSCOPY WITH PROPOFOL ;  Surgeon: Gladis RAYMOND Mariner, MD;  Location: [facility] ENDOSCOPY;  Service: Endoscopy;  Laterality: N/A;   HALLUX VALGUS CORRECTION     HARDWARE REMOVAL Left 03/18/2021   Procedure: HARDWARE REMOVAL;  Surgeon: Leora Lynwood SAUNDERS, MD;  Location: [facility] ORS;  Service: Orthopedics;  Laterality: Left;   HIP CLOSED REDUCTION Left 01/19/2021   Procedure: CLOSED REDUCTION HIP;  Surgeon: Leora Lynwood SAUNDERS, MD;  Location: [facility] ORS;  Service: Orthopedics;  Laterality: Left;   KYPHOPLASTY N/A 06/25/2018   Procedure: KYPHOPLASTY L3;  Surgeon: Kathlynn Sharper, MD;  Location: [facility] ORS;  Service: Orthopedics;  Laterality: N/A;   LUMBAR DISC SURGERY     MASTECTOMY Right 2013   positive   ORIF ELBOW FRACTURE Left 01/19/2021   Procedure: OPEN REDUCTION INTERNAL FIXATION (ORIF) ELBOW/OLECRANON FRACTURE;  Surgeon: Leora Lynwood SAUNDERS, MD;  Location: [facility] ORS;  Service: Orthopedics;  Laterality: Left;   PERCUTANEOUS PINNING Left 01/19/2021   Procedure: PERCUTANEOUS PINNING EXTREMITY;  Surgeon: Leora Lynwood SAUNDERS, MD;  Location: [facility] ORS;  Service: Orthopedics;  Laterality: Left;   PERIPHERAL VASCULAR CATHETERIZATION N/A 09/08/2015   Procedure: Abdominal Aortogram w/Lower Extremity;  Surgeon: Deatrice DELENA Cage, MD;  Location: [facility] INVASIVE CV LAB;  Service: Cardiovascular;  Laterality:  N/A;   PERIPHERAL VASCULAR CATHETERIZATION  09/08/2015   Procedure: Peripheral Vascular Balloon Angioplasty;  Surgeon: Deatrice DELENA Cage, MD;  Location: [facility] INVASIVE CV LAB;  Service: Cardiovascular;;   REVISION TOTAL KNEE ARTHROPLASTY Right    rt.ankle ulcer surgery  2013   TEMPOROMANDIBULAR JOINT ARTHROPLASTY     TMJ ARTHROPLASTY     TONSILLECTOMY     TOTAL KNEE ARTHROPLASTY Right    TOTAL KNEE ARTHROPLASTY Left 02/26/2018   Procedure: TOTAL KNEE ARTHROPLASTY;  Surgeon: Edie Norleen PARAS, MD;  Location: [facility] ORS;  Service: Orthopedics;  Laterality: Left;   Social History   Socioeconomic History   Marital status: Widowed    Spouse name: Not on file   Number of children: 2   Years of education: Not on file   Highest education level: Not on file  Occupational History   Not on file  Tobacco Use   Smoking status: Never   Smokeless tobacco: Never  Vaping Use  Vaping status: Never Used  Substance and Sexual Activity   Alcohol use: No   Drug use: No   Sexual activity: Not Currently    Birth control/protection: Surgical  Other Topics Concern   Not on file  Social History Narrative   Lives at [facility] independent living   Social Drivers of Health   Financial Resource Strain: Low Risk  (02/12/2023)   Received from [facility]   Overall Financial Resource Strain (CARDIA)    Difficulty of Paying Living Expenses: Not hard at all  Food Insecurity: No Food Insecurity (02/12/2023)   Received from [facility]   Hunger Vital Sign    Within the past 12 months, you worried that your food would run out before you got the money to buy more.: Never true    Within the past 12 months, the food you bought just didn't last and you didn't have money to get more.: Never true  Transportation Needs: No Transportation Needs (02/12/2023)   Received from [facility] - Transportation    In the past 12 months, has lack of transportation kept  you from medical appointments or from getting medications?: No    Lack of Transportation (Non-Medical): No  Physical Activity: Not on file  Stress: Not on file  Social Connections: Not on file   Family History  Problem Relation Age of Onset   Heart attack Father    Coronary artery disease Sister 18       MI   Breast cancer Maternal Aunt    Allergies  Allergen Reactions   Other Other (See Comments)    NO BP, VENIPUNCTURE, OR ACCESS IN RIGHT ARM - S/P MASTECTOMY ON RIGHT   Amlodipine  Nausea And Vomiting   Ciprofloxacin Nausea Only   Doxycycline  Itching and Other (See Comments)    shaky   Lipitor [Atorvastatin] Other (See Comments)    Joint pain    Morphine  And Codeine Nausea And Vomiting   Macrobid [Nitrofurantoin Macrocrystal] Rash   Penicillins Rash and Other (See Comments)    Tolerated 1st generation cephalosporin (CEFAZOLIN ) on 08/20/2013 and 01/19/2021 without documented ADRs.  PCN reaction causing immediate rash, facial/tongue/throat swelling, SOB or lightheadedness with hypotension: yes PCN reaction causing severe rash involving mucus membranes or skin necrosis: no PCN reaction that required hospitalization no PCN reaction occurring within the last 10 years: no If all of the above answers are NO, then may proceed with Cephalosporin use.    Sulfa Antibiotics Rash   Prior to Admission medications   Medication Sig Start Date End Date Taking? Authorizing Provider  acetaminophen  (TYLENOL ) 500 MG tablet Take 500 mg by mouth 2 (two) times daily.    [provider]  buPROPion (WELLBUTRIN) 75 MG tablet Take 75 mg by mouth in the morning. 06/05/23 06/04/24  [provider]  busPIRone  (BUSPAR ) 10 MG tablet Take 10 mg by mouth 2 (two) times daily. 11/28/21   [provider]  calcium  carbonate (TUMS EX) 750 MG chewable tablet Chew 2 tablets by mouth every 6 (six) hours as needed.    [provider]  cetirizine (ZYRTEC) 5 MG tablet Take 5 mg by  mouth daily.    [provider]  Cholecalciferol  (VITAMIN D3) 50 MCG (2000 UT) TABS Take 2,000 Units by mouth daily.    [provider]  ELIQUIS  2.5 MG TABS tablet Take 2.5 mg by mouth 2 (two) times daily.    [provider]  escitalopram (  LEXAPRO) 10 MG tablet Take 10 mg by mouth daily.    [provider]  HYDROcodone -acetaminophen  (NORCO) 7.5-325 MG tablet Take 1 tablet by mouth 2 (two) times daily as needed (pain). 06/27/23   [provider]  HYDROcodone -acetaminophen  (NORCO) 7.5-325 MG tablet Take 1 tablet by mouth daily.    [provider]  HYDROcodone -acetaminophen  (NORCO/VICODIN) 5-325 MG tablet Take 1 tablet by mouth every 6 (six) hours as needed. Patient not taking: Reported on 07/03/2023 12/22/21   Laurita Pillion, MD  levothyroxine  (SYNTHROID ) 100 MCG tablet Take 100 mcg by mouth daily before breakfast. Take on an empty stomach with a glass of water at least 30 to 60 minutes before breakfast. 02/13/23 02/13/24  [provider]  levothyroxine  (SYNTHROID ) 125 MCG tablet Take 125 mcg by mouth daily with breakfast. Patient not taking: Reported on 07/02/2023 05/06/21   [provider]  metoprolol  succinate (TOPROL -XL) 25 MG 24 hr tablet Take 25 mg by mouth daily.    [provider]  Multiple Vitamins-Minerals (CENTROVITE) TABS Take 1 tablet by mouth daily. 03/04/18   [provider]  Psyllium 0.36 g CAPS Take 1 capsule by mouth 2 (two) times daily.    [provider]  sertraline  (ZOLOFT ) 50 MG tablet Take 50 mg by mouth daily. 06/30/23   [provider]  spironolactone  (ALDACTONE ) 25 MG tablet Take 1 tablet (25 mg total) by mouth daily. 03/19/18   Landy Barnie RAMAN, NP   CT FEMUR LEFT WO CONTRAST Result Date: 10/23/2023 CLINICAL DATA:  89 year old with fall injury yesterday and continued pain and unable to bear weight. EXAM: CT OF THE LEFT THIGH AND FEMUR WITHOUT CONTRAST TECHNIQUE: Multidetector CT  imaging of the left thigh and femur was performed according to the standard protocol. RADIATION DOSE REDUCTION: This exam was performed according to the departmental dose-optimization program which includes automated exposure control, adjustment of the mA and/or kV according to patient size and/or use of iterative reconstruction technique. COMPARISON:  AP pelvis yesterday, CT scan left knee 12/19/2021. FINDINGS: Bones/Joint/Cartilage Generalized osteopenia. There is a nondisplaced acute intra-articular fracture through the anterior column of the left acetabulum and a nondisplaced fracture of the mid left inferior pubic ramus. There is minimal fluid in the left hip joint. There are 3 threaded hip pins in the proximal left femur and a healed proximal left femoral fracture deformity. No acute left femoral fracture is seen. Assessment for nondisplaced fractures is limited in the distal femur and proximal tibia due to femorotibial joint replacement hardware. Visualized portions of the proximal tibia and fibula are unremarkable as well as can be seen. No displaced patellar fracture or patellar dislocation is seen. There is a small suprapatellar bursal lipohemarthrosis, which was significantly larger in 2023. This may be seen in the setting of an occult fracture or could be residual from the prior injury. Ligaments Suboptimally assessed by CT. Muscles and Tendons No acute noncontrast CT findings. No intramuscular hematoma is evident without contrast. There is mild atrophy of the left gluteus medius and minimus muscles. Soft tissues There is moderate stranding and patchy subcutaneous hemorrhage laterally at the level of the left hip, largest individual hematoma measuring 5.1 x 3.4 x 2.3 cm. There are heavy vascular calcifications. No hematoma or fluid is seen within the left hemipelvis. IMPRESSION: 1. Nondisplaced acute intra-articular fracture through the anterior column of the left acetabulum. 2. Nondisplaced fracture of  the mid left inferior pubic ramus. 3. Osteopenia. 4. No acute left femoral fracture is seen. 5. Small  suprapatellar bursal lipohemarthrosis, which was significantly larger in 2023. This may be seen in the setting of an occult fracture or could be residual from the prior injury. 6. Moderate stranding and patchy subcutaneous hemorrhage laterally at the level of the left hip, largest individual hematoma measuring 5.1 x 3.4 x 2.3 cm. 7. Heavy vascular calcifications. 8. Mild atrophy of the left gluteus medius and minimus muscles. Electronically Signed   By: Francis Quam M.D.   On: 10/23/2023 03:31   DG Pelvis 1-2 Views Result Date: 10/22/2023 CLINICAL DATA:  trauma EXAM: PELVIS - 1-2 VIEW COMPARISON:  None Available. FINDINGS: Three screw fixation of the left femoral neck. No acute displaced fracture or dislocation of the right hip. No acute displaced fracture or diastasis of the bones of the pelvis. Degenerative changes of the lower lumbar spine with kyphoplasty at the L3 level. Degenerative changes of visualized lower lumbar spine. No pelvic bone lesions are seen. IMPRESSION: Negative for acute traumatic injury. Electronically Signed   By: Morgane  Naveau M.D.   On: 10/22/2023 19:32   DG Femur Portable Min 2 Views Right Result Date: 10/22/2023 CLINICAL DATA:  trauma EXAM: RIGHT FEMUR PORTABLE 2 VIEW COMPARISON:  None Available. FINDINGS: There is no evidence of fracture or other focal bone lesions. Soft tissues are unremarkable. Partially visualized total right knee arthroplasty. Vascular calcification. IMPRESSION: Negative for acute traumatic injury. Electronically Signed   By: Morgane  Naveau M.D.   On: 10/22/2023 19:31   DG Forearm Left Result Date: 10/22/2023 CLINICAL DATA:  fall EXAM: LEFT FOREARM - 2 VIEW COMPARISON:  None Available. FINDINGS: There is no evidence of fracture or other focal bone lesions. Degenerative changes of the wrist. Soft tissues are unremarkable. IMPRESSION: No acute displaced  fracture or dislocation. Electronically Signed   By: Morgane  Naveau M.D.   On: 10/22/2023 19:29   CT Head Wo Contrast Result Date: 10/22/2023 CLINICAL DATA:  Neck trauma (Age >= 65y); Head trauma, minor (Age >= 65y) EXAM: CT HEAD WITHOUT CONTRAST CT CERVICAL SPINE WITHOUT CONTRAST TECHNIQUE: Multidetector CT imaging of the head and cervical spine was performed following the standard protocol without intravenous contrast. Multiplanar CT image reconstructions of the cervical spine were also generated. RADIATION DOSE REDUCTION: This exam was performed according to the departmental dose-optimization program which includes automated exposure control, adjustment of the mA and/or kV according to patient size and/or use of iterative reconstruction technique. COMPARISON:  None Available. FINDINGS: CT HEAD FINDINGS Brain: Cerebral ventricle sizes are concordant with the degree of cerebral volume loss. Patchy and confluent areas of decreased attenuation are noted throughout the deep and periventricular white matter of the cerebral hemispheres bilaterally, compatible with chronic microvascular ischemic disease. No evidence of large-territorial acute infarction. No parenchymal hemorrhage. No mass lesion. No extra-axial collection. No mass effect or midline shift. No hydrocephalus. Basilar cisterns are patent. Vascular: No hyperdense vessel. Skull: No acute fracture or focal lesion. Sinuses/Orbits: Paranasal sinuses and mastoid air cells are clear. Bilateral lens replacement. Otherwise the orbits are unremarkable. Other: 3 mm left frontal scalp hematoma. CT CERVICAL SPINE FINDINGS Alignment: Normal. Skull base and vertebrae: No acute fracture. No aggressive appearing focal osseous lesion or focal pathologic process. Soft tissues and spinal canal: No prevertebral fluid or swelling. No visible canal hematoma. Upper chest: Biapical pleural/pulmonary scarring. Other: Atherosclerotic plaque of the carotid arteries within the neck.  IMPRESSION: 1. No acute intracranial abnormality. 2. No acute displaced fracture or traumatic listhesis of the cervical spine. Electronically Signed   By: Morgane  Margarite M.D.   On: 10/22/2023 19:28   CT Cervical Spine Wo Contrast Result Date: 10/22/2023 CLINICAL DATA:  Neck trauma (Age >= 65y); Head trauma, minor (Age >= 65y) EXAM: CT HEAD WITHOUT CONTRAST CT CERVICAL SPINE WITHOUT CONTRAST TECHNIQUE: Multidetector CT imaging of the head and cervical spine was performed following the standard protocol without intravenous contrast. Multiplanar CT image reconstructions of the cervical spine were also generated. RADIATION DOSE REDUCTION: This exam was performed according to the departmental dose-optimization program which includes automated exposure control, adjustment of the mA and/or kV according to patient size and/or use of iterative reconstruction technique. COMPARISON:  None Available. FINDINGS: CT HEAD FINDINGS Brain: Cerebral ventricle sizes are concordant with the degree of cerebral volume loss. Patchy and confluent areas of decreased attenuation are noted throughout the deep and periventricular white matter of the cerebral hemispheres bilaterally, compatible with chronic microvascular ischemic disease. No evidence of large-territorial acute infarction. No parenchymal hemorrhage. No mass lesion. No extra-axial collection. No mass effect or midline shift. No hydrocephalus. Basilar cisterns are patent. Vascular: No hyperdense vessel. Skull: No acute fracture or focal lesion. Sinuses/Orbits: Paranasal sinuses and mastoid air cells are clear. Bilateral lens replacement. Otherwise the orbits are unremarkable. Other: 3 mm left frontal scalp hematoma. CT CERVICAL SPINE FINDINGS Alignment: Normal. Skull base and vertebrae: No acute fracture. No aggressive appearing focal osseous lesion or focal pathologic process. Soft tissues and spinal canal: No prevertebral fluid or swelling. No visible canal hematoma. Upper  chest: Biapical pleural/pulmonary scarring. Other: Atherosclerotic plaque of the carotid arteries within the neck. IMPRESSION: 1. No acute intracranial abnormality. 2. No acute displaced fracture or traumatic listhesis of the cervical spine. Electronically Signed   By: Morgane  Naveau M.D.   On: 10/22/2023 19:28   DG Chest Portable 1 View Result Date: 10/22/2023 CLINICAL DATA:  trauma.  Fall EXAM: PORTABLE CHEST 1 VIEW COMPARISON:  Chest x-ray 08/22/2022, CT chest 08/22/2022 FINDINGS: The heart and mediastinal contours are within normal limits. Atherosclerotic plaque including mitral annular calcification. No focal consolidation. No pulmonary edema. No pleural effusion. No pneumothorax. No acute osseous abnormality. Dextroscoliosis of the thoracic spine. IMPRESSION: 1. No active disease. 2. Aortic Atherosclerosis (ICD10-I70.0) including mitral annular calcification. Electronically Signed   By: Morgane  Naveau M.D.   On: 10/22/2023 19:25    Positive ROS: All other systems have been reviewed and were otherwise negative with the exception of those mentioned in the HPI and as above.  Physical Exam: General:  Alert, no acute distress Psychiatric:  Patient is not competent for consent, but exhibits normal mood and affect   Cardiovascular:  No pedal edema Respiratory:  No wheezing, non-labored breathing GI:  Abdomen is soft and non-tender Skin:  No lesions in the area of chief complaint Neurologic:  Sensation intact distally Lymphatic:  No axillary or cervical lymphadenopathy  Orthopedic Exam:  Orthopedic examination is limited to the left hip and lower extremity.  Skin inspection is notable for well-healed surgical incision along the lateral aspect of her proximal thigh, as well as for moderate swelling over the lateral hip region.  No erythema, ecchymosis, abrasions, or other skin abnormalities are identified.  She has mild tenderness to palpation over the lateral aspect of the hip.  She has more  moderate-severe pain with any attempted active or passive motion of the hip, or when she tries to actively flex her hip.  She is grossly neurovascularly intact to the left lower extremity and foot.  X-rays:  A recent CT scan of the  left hip and femur is available for review and has been reviewed by myself.  These films demonstrate an essentially nondisplaced fracture through the anterior column of the left acetabulum, as well as a nondisplaced inferior pubic ramus fracture.  Postsurgical changes consistent with her history of prior in situ cannulated screw fixation of a left femoral neck fracture also are noted.  No other acute bony abnormalities are identified.  Assessment: Essentially nondisplaced left anterior column and inferior pubic rami fractures.  Plan: The treatment options have been discussed with the patient.  This injury can be managed nonsurgically as her fractures are nondisplaced and inherently stable.  Therefore, the patient may be mobilized with physical therapy, weightbearing as tolerated on the left leg using a walker for balance and support.  The patient may receive medication for pain as deemed appropriate medically.  Most likely, this patient will require rehab placement following her period of hospitalization.  Thank you for asking me to participate in the care of this most pleasant young unfortunate woman.  I will be happy to follow her with you.   DOROTHA Reyes Maltos, MD  Beeper #:  [phone]  10/23/2023 7:32 AM

## 2023-10-23 NOTE — ED Provider Notes (Signed)
 [facility] Provider Note    Event Date/Time   First MD Initiated Contact with Patient 10/23/23 0122     (approximate)   History   Hip Pain   HPI  [name] is a 89 y.o. female   Past medical history of DVT on Eliquis , hypertension and hyperlipidemia, walks with a walker, mixed Alzheimer's and vascular dementia, peripheral artery disease who presents to our department with a mechanical fall and left hip pain.  Was evaluated at [facility] with negative trauma imaging but despite going back to her facility cannot bear weight due to left hip pain.  She did strike her head and has a bruise to her forehead but with negative CT imaging earlier.  External Medical Documents Reviewed: Outside hospital records with imaging obtained earlier today      Physical Exam   Triage Vital Signs: ED Triage Vitals  Encounter Vitals Group     BP 10/23/23 0125 (!) 151/88     Girls Systolic BP Percentile --      Girls Diastolic BP Percentile --      Boys Systolic BP Percentile --      Boys Diastolic BP Percentile --      Pulse Rate 10/23/23 0125 (!) 124     Resp 10/23/23 0125 18     Temp 10/23/23 0125 97.7 F (36.5 C)     Temp src --      SpO2 10/23/23 0125 98 %     Weight 10/23/23 0121 123 lb (55.8 kg)     Height 10/23/23 0121 5' 8 (1.727 m)     Head Circumference --      Peak Flow --      Pain Score 10/23/23 0120 10     Pain Loc --      Pain Education --      Exclude from Growth Chart --     Most recent vital signs: Vitals:   10/23/23 0125 10/23/23 0348  BP: (!) 151/88 129/82  Pulse: (!) 124 (!) 114  Resp: 18 18  Temp: 97.7 F (36.5 C)   SpO2: 98% 92%    General: Awake, no distress.  CV:  Good peripheral perfusion.  Resp:  Normal effort.  Abd:  No distention.  Other:  Bruise to the forehead left side.  Unable to range to the left hip due to pain.  Tender to palpation there.  Able to range the right lower extremity and upper extremities.   Neurovascular intact to the affected left lower extremity.   ED Results / Procedures / Treatments   Labs (all labs ordered are listed, but only abnormal results are displayed) Labs Reviewed  BASIC METABOLIC PANEL WITH GFR - Abnormal; Notable for the following components:      Result Value   Glucose, Bld 136 (*)    Creatinine, Ser 1.22 (*)    GFR, Estimated 43 (*)    All other components within normal limits  CBC WITH DIFFERENTIAL/PLATELET - Abnormal; Notable for the following components:   RBC 3.80 (*)    MCV 101.3 (*)    MCH 34.2 (*)    Platelets 117 (*)    Neutro Abs 8.1 (*)    Monocytes Absolute 1.3 (*)    All other components within normal limits     I ordered and reviewed the above labs they are notable for cell counts electrolytes unremarkable.  EKG  ED ECG REPORT I, Ginnie Shams, the attending physician, personally viewed and  interpreted this ECG.   Date: 10/23/2023  EKG Time: 0315  Rate: 137  Rhythm: AF RVR  Axis: nl  Intervals:nl  ST&T Change: no stemi    RADIOLOGY I independently reviewed and interpreted CT of the femur and see no obvious displaced fractures I also reviewed radiologist's formal read.   PROCEDURES:  Critical Care performed: No  Procedures   MEDICATIONS ORDERED IN ED: Medications  morphine  (PF) 2 MG/ML injection 2 mg (2 mg Intravenous Given 10/23/23 0340)  ondansetron  (ZOFRAN ) injection 4 mg (4 mg Intravenous Given 10/23/23 0339)  sodium chloride  0.9 % bolus 1,000 mL (0 mLs Intravenous Stopped 10/23/23 0433)    External physician / consultants:  I spoke with orthopedic consultant Poggi regarding care plan for this patient.   IMPRESSION / MDM / ASSESSMENT AND PLAN / ED COURSE  I reviewed the triage vital signs and the nursing notes.                                Patient's presentation is most consistent with acute presentation with potential threat to life or bodily function.  Differential diagnosis includes, but is not limited  to, hip or pelvis fracture, dislocation    MDM:    She is here with ongoing left hip pain CT imaging ordered and reveals an acetabular and inferior ramus fracture.  Consulted with Dr. Edie orthopedics this is nonsurgical but given her inability to bear weight will need perhaps a brace and certainly rehab considerations.  Patient with atrial fibrillation and RVR I think driven by her pain.  Much improved after pain medications and some fluids.  Hemodynamics reassuring.  Admission.       FINAL CLINICAL IMPRESSION(S) / ED DIAGNOSES   Final diagnoses:  Closed left hip fracture, initial encounter [facility])     Rx / DC Orders   ED Discharge Orders     None        Note:  This document was prepared using Dragon voice recognition software and may include unintentional dictation errors.    Cyrena Mylar, MD 10/23/23 [phone]

## 2023-10-23 NOTE — Evaluation (Signed)
 Occupational Therapy Evaluation Patient Details Name: [name] MRN: [id number] DOB: [birth year] Today's Date: 10/23/2023   History of Present Illness   Pt is an 89 yo female s/p fall at home, seen at ED and sent home, sent back to ED from facility [facility]) due to inability to weight bear due to L hip fractures. Imaging showed nondisplaced fracture through the anterior column of the left acetabulum, as well as a nondisplaced inferior pubic ramus fracture. Workup for afib with RVR as well. PMH of DVT, HTN, HLD, dementia, PAD, afib, CHF, CKD, breast cancer.     Clinical Impressions Patient presenting with decreased Ind in self care,balance, functional mobility/transfers, endurance, and safety awareness. Patient reports living at home place of [place] [facility]. She endorses use of rollator for mobility but use of SPC to get into and out of shower. Pt with increased pain in  L hip during session. Pt refusing attempts to sit EOB. Rolling L <> R with max A and pt reporting increased pain with mobility. Pt repositioned for comfort. Pt needs further OOB assessment when she is able to tolerate next session. Patient will benefit from acute OT to increase overall independence in the areas of ADLs, functional mobility, and safety awareness in order to safely discharge.     If plan is discharge home, recommend the following:   Two people to help with walking and/or transfers;A lot of help with bathing/dressing/bathroom;Supervision due to cognitive status     Functional Status Assessment   Patient has had a recent decline in their functional status and demonstrates the ability to make significant improvements in function in a reasonable and predictable amount of time.     Equipment Recommendations   Other (comment) (defer to next venue of care)      Precautions/Restrictions   Precautions Precautions: Fall Recall of Precautions/Restrictions: Impaired Restrictions Weight  Bearing Restrictions Per Provider Order: Yes LLE Weight Bearing Per Provider Order: Weight bearing as tolerated     Mobility Bed Mobility Overal bed mobility: Needs Assistance Bed Mobility: Rolling Rolling: Max assist                        ADL either performed or assessed with clinical judgement   ADL Overall ADL's : Needs assistance/impaired     Grooming: Modified independent;Set up;Bed level                                       Vision Patient Visual Report: No change from baseline              Pertinent Vitals/Pain Pain Assessment Pain Assessment: Faces Faces Pain Scale: Hurts whole lot Pain Location: L hip, Pain Descriptors / Indicators: Aching, Discomfort, Grimacing, Guarding Pain Intervention(s): Limited activity within patient's tolerance, Monitored during session, Repositioned     Extremity/Trunk Assessment Upper Extremity Assessment Upper Extremity Assessment: Generalized weakness (limited B shoulder elevation)   Lower Extremity Assessment Lower Extremity Assessment: Generalized weakness       Communication Communication Communication: Impaired Factors Affecting Communication: Hearing impaired   Cognition Arousal: Alert Behavior During Therapy: WFL for tasks assessed/performed Cognition: No family/caregiver present to determine baseline                               Following commands: Intact  Cueing  General Comments   Cueing Techniques: Verbal cues;Gestural cues;Tactile cues              Home Living Family/patient expects to be discharged to:: Assisted living                             Home Equipment: Rolling Walker (2 wheels);Rollator (4 wheels);Shower seat   Additional Comments: pt from home place of [place]      Prior Functioning/Environment Prior Level of Function : Needs assist             Mobility Comments: Uses elevator to get to dining hall for meals  with rollator ADLs Comments: Pt endorses use SPC to get into/out of shower, Ind with self care, staff assist with med management. Rollator for mobility.    OT Problem List: Decreased strength;Impaired balance (sitting and/or standing);Decreased cognition;Pain;Decreased safety awareness;Decreased activity tolerance;Decreased knowledge of use of DME or AE   OT Treatment/Interventions: Self-care/ADL training;Therapeutic exercise;Patient/family education;Balance training;Energy conservation;DME and/or AE instruction      OT Goals(Current goals can be found in the care plan section)   Acute Rehab OT Goals Patient Stated Goal: to get better OT Goal Formulation: With patient Time For Goal Achievement: 11/06/23 Potential to Achieve Goals: Fair ADL Goals Pt Will Perform Grooming: with supervision;standing Pt Will Perform Lower Body Dressing: with contact guard assist;with adaptive equipment;sit to/from stand Pt Will Transfer to Toilet: with contact guard assist;bedside commode;ambulating Pt Will Perform Toileting - Clothing Manipulation and hygiene: with contact guard assist;sit to/from stand   OT Frequency:  Min 2X/week    Co-evaluation              AM-PAC OT 6 Clicks Daily Activity     Outcome Measure Help from another person eating meals?: None Help from another person taking care of personal grooming?: A Little Help from another person toileting, which includes using toliet, bedpan, or urinal?: A Lot Help from another person bathing (including washing, rinsing, drying)?: A Lot Help from another person to put on and taking off regular upper body clothing?: A Little Help from another person to put on and taking off regular lower body clothing?: A Lot 6 Click Score: 16   End of Session Nurse Communication: Mobility status  Activity Tolerance: Patient limited by fatigue;Patient limited by pain Patient left: in bed;with call bell/phone within reach;with bed alarm set  OT Visit  Diagnosis: Unsteadiness on feet (R26.81);Repeated falls (R29.6);Muscle weakness (generalized) (M62.81)                Time: 8496-8484 OT Time Calculation (min): 12 min Charges:  OT General Charges $OT Visit: 1 Visit OT Evaluation $OT Eval Moderate Complexity: 1 [address], [place], OTR/L , CBIS ascom [phone]  10/23/23, 3:26 PM

## 2023-10-23 NOTE — Evaluation (Signed)
 Physical Therapy Evaluation Patient Details Name: [name] MRN: [id number] DOB: 1933/03/26 Today's Date: 10/23/2023  History of Present Illness  Pt is an 89 yo female s/p fall at home, seen at ED and sent home, sent back to ED from facility [facility] [place]) due to inability to weight bear due to L hip fractures. Imaging showed nondisplaced fracture through the anterior column of the left acetabulum, as well as a nondisplaced inferior pubic ramus fracture. Workup for afib with RVR as well. PMH of DVT, HTN, HLD, dementia, PAD, afib, CHF, CKD, breast cancer.   Clinical Impression  Patient alert, agreeable to PT with encouragement. Oriented to self, place, some situation (that she broke her hip and how she broke it). Reported living at home place, ambulatory with rollator, able to dress modI, uses  for showering.   She was able to lift all extremities against gravity, except LLE. maxA for bed mobility, extra time. Pt exhibited significant pain signs/symptoms with all mobility, RN notified. She was able to sit after repositioning in midline with fair balance. Sit <> stand with modAx2 and RW, and needed modAx2-maxAx2 to step pivot. Limited in LLE weight bearing, needed assistance to offweight LLE.  Overall the patient demonstrated deficits (see PT Problem List) that impede the patient's functional abilities, safety, and mobility and would benefit from skilled PT intervention.          If plan is discharge home, recommend the following: Two people to help with walking and/or transfers;Two people to help with bathing/dressing/bathroom;Help with stairs or ramp for entrance;Assist for transportation;Assistance with feeding;Assistance with cooking/housework;Supervision due to cognitive status   Can travel by private vehicle   No    Equipment Recommendations Other (comment) (TBD)  Recommendations for Other Services       Functional Status Assessment Patient has had a recent decline in  their functional status and demonstrates the ability to make significant improvements in function in a reasonable and predictable amount of time.     Precautions / Restrictions Precautions Precautions: Fall Recall of Precautions/Restrictions: Impaired Restrictions Weight Bearing Restrictions Per Provider Order: Yes LLE Weight Bearing Per Provider Order: Weight bearing as tolerated      Mobility  Bed Mobility Overal bed mobility: Needs Assistance Bed Mobility: Supine to Sit     Supine to sit: Max assist, Used rails, HOB elevated          Transfers Overall transfer level: Needs assistance Equipment used: Rolling walker (2 wheels) Transfers: Sit to/from Stand, Bed to chair/wheelchair/BSC Sit to Stand: Mod assist, +2 physical assistance   Step pivot transfers: Mod assist, +2 physical assistance, Max assist       General transfer comment: needed assistance to off LLE    Ambulation/Gait               General Gait Details: deferred  Stairs            Wheelchair Mobility     Tilt Bed    Modified Rankin (Stroke Patients Only)       Balance Overall balance assessment: Needs assistance Sitting-balance support: Feet supported Sitting balance-[name]: Fair     Standing balance support: Bilateral upper extremity supported, During functional activity, Reliant on assistive device for balance Standing balance-[name]: Poor                               Pertinent Vitals/Pain Pain Assessment Pain Assessment: Faces Faces Pain  Scale: Hurts whole lot Pain Location: L hip, pt able to point Pain Descriptors / Indicators: Aching, Guarding Pain Intervention(s): Limited activity within patient's tolerance, Monitored during session, Repositioned    Home Living                     Additional Comments: pt from home place, reported using rollator at baseline    Prior Function               Mobility Comments: stated she  normally walks a lot: uses elevator to get to dining hall ADLs Comments: said she had a [place] that she uses when she gets in/out of shower     Extremity/Trunk Assessment   Upper Extremity Assessment Upper Extremity Assessment: Generalized weakness    Lower Extremity Assessment Lower Extremity Assessment: Generalized weakness (unable to move LLE against gravity)       Communication   Communication Factors Affecting Communication: Hearing impaired    Cognition Arousal: Alert Behavior During Therapy: WFL for tasks assessed/performed   PT - Cognitive impairments: History of cognitive impairments                       PT - Cognition Comments: oriented to self, place, able to describe some situation Following commands: Intact       Cueing Cueing Techniques: Verbal cues, Gestural cues, Tactile cues     General Comments      Exercises     Assessment/Plan    PT Assessment Patient needs continued PT services  PT Problem List Decreased strength;Decreased range of motion;Decreased activity tolerance;Decreased balance;Decreased knowledge of precautions;Decreased mobility;Decreased safety awareness;Decreased knowledge of use of DME;Pain       PT Treatment Interventions DME instruction;Balance training;Gait training;Neuromuscular re-education;Stair training;Functional mobility training;Patient/family education;Therapeutic exercise;Therapeutic activities    PT Goals (Current goals can be found in the Care Plan section)  Acute Rehab PT Goals Patient Stated Goal: to have less pain PT Goal Formulation: With patient Time For Goal Achievement: 11/06/23 Potential to Achieve Goals: Fair    Frequency Min 3X/week     Co-evaluation               AM-PAC PT 6 Clicks Mobility  Outcome Measure Help needed turning from your back to your side while in a flat bed without using bedrails?: A Lot Help needed moving from lying on your back to sitting on the side of a flat  bed without using bedrails?: A Lot Help needed moving to and from a bed to a chair (including a wheelchair)?: A Lot Help needed standing up from a chair using your arms (e.g., wheelchair or bedside chair)?: A Lot Help needed to walk in hospital room?: Total Help needed climbing 3-5 steps with a railing? : Total 6 Click Score: 10    End of Session Equipment Utilized During Treatment: Gait belt Activity Tolerance: Patient limited by pain Patient left: in chair;with call bell/phone within reach;with chair alarm set Nurse Communication: Mobility status PT Visit Diagnosis: Other abnormalities of gait and mobility (R26.89);Difficulty in walking, not elsewhere classified (R26.2);Muscle weakness (generalized) (M62.81);Pain Pain - Right/Left: Left Pain - part of body: Hip    Time: 8897-8876 PT Time Calculation (min) (ACUTE ONLY): 21 min   Charges:   PT Evaluation $PT Eval Low Complexity: 1 Low PT Treatments $Therapeutic Activity: 8-22 mins PT General Charges $$ ACUTE PT VISIT: 1 Visit         Doyal Shams PT, DPT 1:28 PM,10/23/23

## 2023-10-23 NOTE — Progress Notes (Signed)
 At 2000 patient heart rate was 111 at 2012 rechecked by writer, Heart rate read 104. Patient is stable, no distress noted.

## 2023-10-23 NOTE — ED Triage Notes (Signed)
 Pt arrives via [facility] from homeplace of [place]. Pt had a fall on 10/22/23 at about 1700 and was taken to [facility]. She was evaluated and discharged and taken back to facility. Facility called EMS to bring her back to be re evaluated. Pt complaining of L hip pain. Pt normally up and walking before fall.

## 2023-10-24 DIAGNOSIS — I480 Paroxysmal atrial fibrillation: Secondary | ICD-10-CM | POA: Diagnosis not present

## 2023-10-24 DIAGNOSIS — N1832 Chronic kidney disease, stage 3b: Secondary | ICD-10-CM | POA: Diagnosis not present

## 2023-10-24 DIAGNOSIS — S32402A Unspecified fracture of left acetabulum, initial encounter for closed fracture: Secondary | ICD-10-CM | POA: Diagnosis not present

## 2023-10-24 LAB — URINALYSIS, COMPLETE (UACMP) WITH MICROSCOPIC
Bacteria, UA: NONE SEEN
Bilirubin Urine: NEGATIVE
Glucose, UA: NEGATIVE mg/dL
Hgb urine dipstick: NEGATIVE
Ketones, ur: NEGATIVE mg/dL
Leukocytes,Ua: NEGATIVE
Nitrite: NEGATIVE
Protein, ur: NEGATIVE mg/dL
RBC / HPF: 0 RBC/hpf (ref 0–5)
Specific Gravity, Urine: 1.012 (ref 1.005–1.030)
Squamous Epithelial / HPF: 0 /HPF (ref 0–5)
pH: 6 (ref 5.0–8.0)

## 2023-10-24 LAB — BASIC METABOLIC PANEL WITH GFR
Anion gap: 9 (ref 5–15)
BUN: 21 mg/dL (ref 8–23)
CO2: 29 mmol/L (ref 22–32)
Calcium: 9.3 mg/dL (ref 8.9–10.3)
Chloride: 104 mmol/L (ref 98–111)
Creatinine, Ser: 1.02 mg/dL — ABNORMAL HIGH (ref 0.44–1.00)
GFR, Estimated: 53 mL/min — ABNORMAL LOW (ref >60)
Glucose, Bld: 101 mg/dL — ABNORMAL HIGH (ref 70–99)
Potassium: 4.2 mmol/L (ref 3.5–5.1)
Sodium: 142 mmol/L (ref 135–145)

## 2023-10-24 LAB — CBC
HCT: 35 % — ABNORMAL LOW (ref 36.0–46.0)
Hemoglobin: 11.6 g/dL — ABNORMAL LOW (ref 12.0–15.0)
MCH: 33.8 pg (ref 26.0–34.0)
MCHC: 33.1 g/dL (ref 30.0–36.0)
MCV: 102 fL — ABNORMAL HIGH (ref 80.0–100.0)
Platelets: 104 K/uL — ABNORMAL LOW (ref 150–400)
RBC: 3.43 MIL/uL — ABNORMAL LOW (ref 3.87–5.11)
RDW: 11.9 % (ref 11.5–15.5)
WBC: 6.7 K/uL (ref 4.0–10.5)
nRBC: 0 % (ref 0.0–0.2)

## 2023-10-24 LAB — VITAMIN D 25 HYDROXY (VIT D DEFICIENCY, FRACTURES): Vit D, 25-Hydroxy: 54.04 ng/mL (ref 30–100)

## 2023-10-24 NOTE — Progress Notes (Signed)
 Physical Therapy Treatment Patient Details Name: [name] MRN: [id number] DOB: 05/01/1932 Today's Date: 10/24/2023   History of Present Illness Pt is an 89 yo female s/p fall at home, seen at ED and sent home, sent back to ED from facility [facility] [place]) due to inability to weight bear due to L hip fractures. Imaging showed nondisplaced fracture through the anterior column of the left acetabulum, as well as a nondisplaced inferior pubic ramus fracture. Workup for afib with RVR as well. PMH of DVT, HTN, HLD, dementia, PAD, afib, CHF, CKD, breast cancer.    PT Comments  Pt found in recliner wanting to return to bed but refusing to let multiple nurses in the room assist.  Pt moderately agitated and unable to be redirected needing extensive multi-modal cuing for all sequencing.  Pt required heavy +2 assist with sit to/from stand transfers with physical assist for hand and foot positioning prior to standing.  Pt somewhat unsteady upon initial stand but static standing balance grossly improved during the session.  Multiple attempts made for pt to initiate movement of her LEs to go from bed to chair with pt only able to shuffle each foot 1-2 inches before being unable to move them further.  Bed brought to pt with max cuing provided on proper sequencing to control descent during stand to sit.  Pt will benefit from continued PT services upon discharge to safely address deficits listed in patient problem list for decreased caregiver assistance and eventual return to PLOF.       If plan is discharge home, recommend the following: Two people to help with walking and/or transfers;Two people to help with bathing/dressing/bathroom;Help with stairs or ramp for entrance;Assist for transportation;Assistance with feeding;Assistance with cooking/housework;Supervision due to cognitive status   Can travel by private vehicle     No  Equipment Recommendations  Other (comment) (TBD)    Recommendations for  Other Services       Precautions / Restrictions Precautions Precautions: Fall Recall of Precautions/Restrictions: Impaired Restrictions Weight Bearing Restrictions Per Provider Order: Yes LLE Weight Bearing Per Provider Order: Weight bearing as tolerated     Mobility  Bed Mobility               General bed mobility comments: NT    Transfers Overall transfer level: Needs assistance Equipment used: Rolling walker (2 wheels) Transfers: Sit to/from Stand Sit to Stand: Mod assist, Max assist, +2 physical assistance           General transfer comment: Pt required +2 modA-maxA to complete STS from recliner with heavy multi-modal cues for proper sequencing. Pt demonstrated posterior lean with initial stand but grossly improved during static standing    Ambulation/Gait Ambulation/Gait assistance: Min assist Gait Distance (Feet): 0 Feet Assistive device: Rolling walker (2 wheels) Gait Pattern/deviations: Shuffle, Trunk flexed       General Gait Details: Even with max multi-modal cuing for sequencing to initiate steps pt only able to shuffle each foot 1-2 inches; min A provided to assist with RW management   Stairs             Wheelchair Mobility     Tilt Bed    Modified Rankin (Stroke Patients Only)       Balance Overall balance assessment: Needs assistance Sitting-balance support: Feet supported, No upper extremity supported Sitting balance-[name]: Fair     Standing balance support: Bilateral upper extremity supported, During functional activity, Reliant on assistive device for balance Standing balance-[name]:  Poor                              Communication Communication Communication: Impaired Factors Affecting Communication: Hearing impaired  Cognition Arousal: Alert Behavior During Therapy: Agitated   PT - Cognitive impairments: History of cognitive impairments, No family/caregiver present to determine baseline                          Following commands: Impaired Following commands impaired: Follows one step commands inconsistently, Follows one step commands with increased time    Cueing Cueing Techniques: Verbal cues, Gestural cues, Tactile cues  Exercises      General Comments        Pertinent Vitals/Pain Pain Assessment Pain Assessment: PAINAD Breathing: normal Negative Vocalization: occasional moan/groan, low speech, negative/disapproving quality Facial Expression: smiling or inexpressive Body Language: tense, distressed pacing, fidgeting Consolability: distracted or reassured by voice/touch PAINAD Score: 3 Pain Location: L hip, Pain Descriptors / Indicators: Discomfort, Grimacing, Guarding Pain Intervention(s): Premedicated before session, Monitored during session, Repositioned    Home Living                          Prior Function            PT Goals (current goals can now be found in the care plan section) Progress towards PT goals: PT to reassess next treatment    Frequency    Min 3X/week      PT Plan      Co-evaluation              AM-PAC PT 6 Clicks Mobility   Outcome Measure  Help needed turning from your back to your side while in a flat bed without using bedrails?: A Lot Help needed moving from lying on your back to sitting on the side of a flat bed without using bedrails?: A Lot Help needed moving to and from a bed to a chair (including a wheelchair)?: Total Help needed standing up from a chair using your arms (e.g., wheelchair or bedside chair)?: A Lot Help needed to walk in hospital room?: Total Help needed climbing 3-5 steps with a railing? : Total 6 Click Score: 9    End of Session Equipment Utilized During Treatment: Gait belt Activity Tolerance: Patient limited by pain Patient left: in bed;with nursing/sitter in room (Pt left with nurse and CNA sitting EOB at end of session) Nurse Communication: Mobility status PT Visit  Diagnosis: Other abnormalities of gait and mobility (R26.89);Difficulty in walking, not elsewhere classified (R26.2);Muscle weakness (generalized) (M62.81);Pain Pain - Right/Left: Left Pain - part of body: Hip     Time: 8272-8262 PT Time Calculation (min) (ACUTE ONLY): 10 min  Charges:    $Therapeutic Activity: 8-22 mins PT General Charges $$ ACUTE PT VISIT: 1 Visit                     D. Scott Shooter Tangen PT, DPT 10/24/23, 6:01 PM

## 2023-10-24 NOTE — TOC Progression Note (Signed)
 Transition of Care [facility]) - Progression Note    Patient Details  Name: [name] MRN: [id number] Date of Birth: 1932-09-15  Transition of Care [facility]) CM/SW Contact  [place] L Marlin [place], [place] Phone Number: 10/24/2023, 8:21 AM  Clinical Narrative:     CSW spoke with Elspeth Sink, son. Mr. Curtiss and CSW discussed choice for SNF placement. Mr. Welliver requested for a bed to be located at [facility].   CSW will complete FL2.        Expected Discharge Plan and Services                                               Social Determinants of Health (SDOH) Interventions SDOH Screenings   Food Insecurity: Patient Unable To Answer (10/23/2023)  Housing: Patient Unable To Answer (10/23/2023)  Transportation Needs: Patient Unable To Answer (10/23/2023)  Utilities: Patient Unable To Answer (10/23/2023)  Financial Resource Strain: Low Risk  (02/12/2023)   Received from [facility]  Social Connections: Patient Unable To Answer (10/23/2023)  Tobacco Use: Low Risk  (10/23/2023)    Readmission Risk Interventions     No data to display

## 2023-10-24 NOTE — Plan of Care (Signed)
   Problem: Education: Goal: Knowledge of General Education information will improve Description Including pain rating scale, medication(s)/side effects and non-pharmacologic comfort measures Outcome: Progressing

## 2023-10-24 NOTE — TOC Progression Note (Signed)
 Transition of Care [facility]) - Progression Note    Patient Details  Name: [name] MRN: [id number] Date of Birth: [birth year]  Transition of Care [facility]) CM/SW Contact  [place] Nicholas [place], [place] Phone Number: 10/24/2023, 10:13 AM  Clinical Narrative:     CSW sent requests to bed offers to facilities for review.         Expected Discharge Plan and Services                                               Social Determinants of Health (SDOH) Interventions SDOH Screenings   Food Insecurity: Patient Unable To Answer (10/23/2023)  Housing: Patient Unable To Answer (10/23/2023)  Transportation Needs: Patient Unable To Answer (10/23/2023)  Utilities: Patient Unable To Answer (10/23/2023)  Financial Resource Strain: Low Risk  (02/12/2023)   Received from [facility]  Social Connections: Patient Unable To Answer (10/23/2023)  Tobacco Use: Low Risk  (10/23/2023)    Readmission Risk Interventions     No data to display

## 2023-10-24 NOTE — NC FL2 (Addendum)
 [place]  MEDICAID [id number] LEVEL OF CARE FORM     IDENTIFICATION  Patient Name: [name] Birthdate: 08/08/1932 Sex: female Admission Date (Current Location): 10/23/2023  [facility] and [place] Number:  Chiropodist and Address:  [facility], [address] [place], [place] [address]      Provider Number: [id number]  Attending Physician Name and Address:  Laurita Pillion, MD  Relative Name and Phone Number:  Keasia Dubose [phone]    Current Level of Care: Hospital Recommended Level of Care: Skilled Nursing Facility Prior Approval Number:    Date Approved/Denied:   PASRR Number: [id number]  Discharge Plan: SNF    Current Diagnoses: Patient Active Problem List   Diagnosis Date Noted   Closed nondisplaced fracture of acetabulum (HCC) 10/23/2023   Inferior pubic ramus fracture, left, closed, initial encounter (HCC) 10/23/2023   Closed fracture of left distal femur (HCC) 12/20/2021   Slurred speech 12/20/2021   Thrombocytopenia (HCC) 12/20/2021   Closed left hip fracture (HCC) 01/17/2021   Syncope and collapse 12/20/2020   Acute deep vein thrombosis (DVT) of left peroneal vein (HCC) 03/06/2018   Arterial leg ulcer (HCC) 03/06/2018   Hypertensive heart and kidney disease with chronic diastolic congestive heart failure and stage 3 chronic kidney disease (HCC) 03/06/2018   GERD without esophagitis 03/06/2018   CKD stage 3b, GFR 30-44 ml/min (HCC) 03/06/2018   Anemia 03/06/2018   Hypothyroidism due to acquired atrophy of thyroid  03/06/2018   Dyslipidemia 03/06/2018   Primary osteoarthritis of left knee 03/06/2018   Status post total knee replacement using cement, left 02/26/2018   Orthostatic hypotension 01/14/2016   ARF (acute renal failure) (HCC) 01/14/2016   Dehydration 01/14/2016   Protein-calorie malnutrition, severe 01/12/2016   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   PAD (peripheral artery disease) (HCC)  08/10/2014   Chronic diastolic congestive heart failure (HCC) 02/26/2014   Renal insufficiency 09/14/2011   PAF (paroxysmal atrial fibrillation) (HCC) 08/09/2010   Hypertension 08/09/2010    Orientation RESPIRATION BLADDER Height & Weight     Self, Time, Place    Continent Weight: 123 lb (55.8 kg) Height:  5' 8 (172.7 cm)  BEHAVIORAL SYMPTOMS/MOOD NEUROLOGICAL BOWEL NUTRITION STATUS     (hx of Alzheimer's and Dementia) Continent Diet  AMBULATORY STATUS COMMUNICATION OF NEEDS Skin   Limited Assist Verbally Skin abrasions                       Personal Care Assistance Level of Assistance  Bathing, Feeding, Dressing Bathing Assistance: Limited assistance Feeding assistance: Limited assistance Dressing Assistance: Limited assistance     Functional Limitations Info  Sight, Hearing, Speech          SPECIAL CARE FACTORS FREQUENCY  PT (By licensed PT), OT (By licensed OT)     PT Frequency: 5x OT Frequency: 5x            Contractures Contractures Info: Not present    Additional Factors Info  Code Status, Allergies Code Status Info: DNR LIMITED Allergies Info: NOT SPECIFIED           Current Medications (10/24/2023):  This is the current hospital active medication list Current Facility-Administered Medications  Medication Dose Route Frequency Provider Last Rate Last Admin   acetaminophen  (TYLENOL ) tablet 500 mg  500 mg Oral Q8H Patel, Pranav M, MD   500 mg at 10/24/23 9491   apixaban  (ELIQUIS ) tablet 2.5 mg  2.5 mg Oral BID Tobie,  Pranav M, MD       busPIRone  (BUSPAR ) tablet 15 mg  15 mg Oral BID Patel, Pranav M, MD   15 mg at 10/23/23 2138   docusate sodium  (COLACE) capsule 100 mg  100 mg Oral BID Patel, Pranav M, MD   100 mg at 10/23/23 2139   feeding supplement (ENSURE PLUS HIGH PROTEIN) liquid 237 mL  237 mL Oral TID BM Patel, Pranav M, MD   237 mL at 10/23/23 1958   HYDROcodone -acetaminophen  (NORCO/VICODIN) 5-325 MG per tablet 1 tablet  1 tablet Oral Q6H  PRN Patel, Pranav M, MD       levothyroxine  (SYNTHROID ) tablet 100 mcg  100 mcg Oral Q0600 Patel, Pranav M, MD   100 mcg at 10/24/23 9490   methocarbamol  (ROBAXIN ) tablet 500 mg  500 mg Oral Q6H PRN Patel, Pranav M, MD       Or   methocarbamol  (ROBAXIN ) injection 500 mg  500 mg Intravenous Q6H PRN Patel, Pranav M, MD       metoprolol  succinate (TOPROL -XL) 24 hr tablet 25 mg  25 mg Oral Daily Patel, Pranav M, MD       morphine  (PF) 2 MG/ML injection 2 mg  2 mg Intravenous Q2H PRN Patel, Pranav M, MD       multivitamin with minerals tablet 1 tablet  1 tablet Oral Daily Saleha, Kalp, [facility]       ondansetron  (ZOFRAN ) injection 4 mg  4 mg Intravenous Q6H PRN Patel, Pranav M, MD       polyethylene glycol (MIRALAX  / GLYCOLAX ) packet 17 g  17 g Oral Daily PRN Patel, Pranav M, MD       sertraline  (ZOLOFT ) tablet 50 mg  50 mg Oral Daily Patel, Pranav M, MD   50 mg at 10/23/23 1408     Discharge Medications: Please see discharge summary for a list of discharge medications.  Relevant Imaging Results:  Relevant Lab Results:   Additional Information [id number]  [place] L Zarya Lasseigne, LCSW

## 2023-10-24 NOTE — Progress Notes (Signed)
  Progress Note   Patient: [name] FMW:[id number] DOB: Jan 08, 1933 DOA: 10/23/2023     0 DOS: the patient was seen and examined on 10/24/2023   Brief hospital course: Patient with PMH of paroxysmal A-fib, chronic HFpEF, chronic thrombocytopenia, hypothyroidism, PAD, CKD 3B, HLD, right breast cancer presented to the hospital with complaints of a mechanical fall. Underwent x-ray chest, x-ray pelvis, x-ray of femur right, x-ray left forearm, CT head as well as CT C-spine workup in the ED which was unremarkable for any acute fracture.  CT of the hip showed nondisplaced fracture of left vestibular as well as left inferior pubic rami fracture .  Patient is seen by orthopedics, no need for surgery.  However, PT/OT recommend nursing home placement    Principal Problem:   Closed nondisplaced fracture of acetabulum (HCC) Active Problems:   PAF (paroxysmal atrial fibrillation) (HCC)   Thrombocytopenia (HCC)   Hypothyroidism due to acquired atrophy of thyroid    Chronic diastolic congestive heart failure (HCC)   PAD (peripheral artery disease) (HCC)   Ductal carcinoma in situ (DCIS) of right breast   CKD stage 3b, GFR 30-44 ml/min (HCC)   Dyslipidemia   Inferior pubic ramus fracture, left, closed, initial encounter (HCC)   Assessment and Plan: Close nondisplaced fracture of left vestibular as well as left inferior pubic rami fracture. Mechanical fall with left hip pain. Patient has nondisplaced pelvic fracture, no need for surgery.  Continue symptomatic control.  Pending nursing placement.   Paroxysmal A-fib. Chronic thrombocytopenia. Continue beta-blocker as well as Eliquis .  Dysuria. Patient has complaints of dysuria, no sepsis.  Will send UA.   CKD 3B. No function stable   HTN. Chronic HFpEF. Echo 2019 EF 55 to 60%. Blood pressure stable, no exacerbation congestive heart failure.  Continue to follow.   Mood disorder. On BuSpar  and Zoloft .    Reported history of  dementia. Continue to follow  History of breast cancer.        Subjective:  Patient feels well today,  no significant pain.  Physical Exam: Vitals:   10/24/23 0237 10/24/23 0413 10/24/23 0840 10/24/23 0842  BP: 113/63 (!) 158/77 (!) 139/92   Pulse: (!) 117 99 (!) 107   Resp: 18 18 16    Temp: 98.7 F (37.1 C) 98 F (36.7 C) (!) 97.4 F (36.3 C) 97.6 F (36.4 C)  TempSrc:   Oral Oral  SpO2: 97% 100% 97%   Weight:      Height:       General exam: Appears calm and comfortable  Respiratory system: Clear to auscultation. Respiratory effort normal. Cardiovascular system: S1 & S2 heard, RRR. No JVD, murmurs, rubs, gallops or clicks. No pedal edema. Gastrointestinal system: Abdomen is nondistended, soft and nontender. No organomegaly or masses felt. Normal bowel sounds heard. Central nervous system: Alert and oriented. No focal neurological deficits. Extremities: Symmetric 5 x 5 power. Skin: No rashes, lesions or ulcers Psychiatry: Judgement and insight appear normal. Mood & affect appropriate.    Data Reviewed:  Reviewed CT scan results, lab results   Family Communication: None  Disposition: Status is: Observation      Time spent: 35 minutes  Author: Murvin Mana, MD 10/24/2023 12:48 PM  For on call review [email or web address].

## 2023-10-24 NOTE — Progress Notes (Signed)
 Physical Therapy Treatment Patient Details Name: [name] MRN: [id number] DOB: 10/16/1932 Today's Date: 10/24/2023   History of Present Illness Pt is an 89 yo female s/p fall at home, seen at ED and sent home, sent back to ED from facility [facility] [place]) due to inability to weight bear due to L hip fractures. Imaging showed nondisplaced fracture through the anterior column of the left acetabulum, as well as a nondisplaced inferior pubic ramus fracture. Workup for afib with RVR as well. PMH of DVT, HTN, HLD, dementia, PAD, afib, CHF, CKD, breast cancer.    PT Comments  Pt was pleasantly confused upon PT arrival, but able to participate during the session and put forth fair effort throughout. Pt required +2 physical assistance for all mobility tasks, per below. Pt was able to tolerate standing therex described below, but session was limited due to pt's c/o L hip pain. Pt demonstrated difficulty with tasks when weight bearing on LLE > RLE. Pt reported no adverse symptoms during the session other than L hip pain with SpO2 and HR WNL throughout on room air. Pt will benefit from continued PT services upon discharge to safely address deficits listed in patient problem list for decreased caregiver assistance and eventual return to PLOF.    If plan is discharge home, recommend the following: Two people to help with walking and/or transfers;Two people to help with bathing/dressing/bathroom;Help with stairs or ramp for entrance;Assist for transportation;Assistance with feeding;Assistance with cooking/housework;Supervision due to cognitive status   Can travel by private vehicle     No  Equipment Recommendations  Other (comment) (TBD)    Recommendations for Other Services       Precautions / Restrictions Precautions Precautions: Fall Recall of Precautions/Restrictions: Impaired Restrictions Weight Bearing Restrictions Per Provider Order: Yes LLE Weight Bearing Per Provider Order: Weight  bearing as tolerated     Mobility  Bed Mobility Overal bed mobility: Needs Assistance Bed Mobility: Rolling Rolling: Max assist, +2 for physical assistance   Supine to sit: Max assist, HOB elevated, +2 for physical assistance     General bed mobility comments: Pt required +2 maxA to come to sitting EOB from supine for trunk control and to swing BLE off EOB    Transfers Overall transfer level: Needs assistance Equipment used: Rolling walker (2 wheels) Transfers: Sit to/from Stand Sit to Stand: Mod assist, Max assist, +2 physical assistance           General transfer comment: Pt required +2 modA-maxA to complete STS from EOB with RW. Pt demonstrated posterior lean when coming to standing    Ambulation/Gait               General Gait Details: Pt made multiple effortful attempts to advance RLE, but was unable to do so with heavy multimodal cuing. Pt was able to minimally advance LLE with multiple effortful attempts   Stairs             Wheelchair Mobility     Tilt Bed    Modified Rankin (Stroke Patients Only)       Balance Overall balance assessment: Needs assistance Sitting-balance support: Feet supported, No upper extremity supported Sitting balance-[name]: Fair     Standing balance support: Bilateral upper extremity supported, During functional activity, Reliant on assistive device for balance Standing balance-[name]: Poor Standing balance comment: Pt required minA to maintain balance once in standing with RW. Once pt was in standing, pt remained steady and no LOBs occured  Communication Communication Communication: Impaired Factors Affecting Communication: Hearing impaired  Cognition Arousal: Alert Behavior During Therapy: WFL for tasks assessed/performed   PT - Cognitive impairments: History of cognitive impairments                         Following commands: Impaired Following  commands impaired: Only follows one step commands consistently    Cueing Cueing Techniques: Verbal cues, Gestural cues, Tactile cues  Exercises Other Exercises Other Exercises: Static stance x 3 minutes with RW Other Exercises: Standing marches x 10 LLE, x 2 RLE with RW Other Exercises: Standing weight shifting x 3 minutes bilaterally with RW    General Comments        Pertinent Vitals/Pain Pain Assessment Pain Assessment: PAINAD Faces Pain Scale: Hurts whole lot Pain Intervention(s): Monitored during session    Home Living                          Prior Function            PT Goals (current goals can now be found in the care plan section) Progress towards PT goals: PT to reassess next treatment    Frequency    Min 3X/week      PT Plan      Co-evaluation              AM-PAC PT 6 Clicks Mobility   Outcome Measure  Help needed turning from your back to your side while in a flat bed without using bedrails?: A Lot Help needed moving from lying on your back to sitting on the side of a flat bed without using bedrails?: A Lot Help needed moving to and from a bed to a chair (including a wheelchair)?: A Lot Help needed standing up from a chair using your arms (e.g., wheelchair or bedside chair)?: A Lot Help needed to walk in hospital room?: Total Help needed climbing 3-5 steps with a railing? : Total 6 Click Score: 10    End of Session Equipment Utilized During Treatment: Gait belt Activity Tolerance: Patient limited by pain Patient left: in chair;with call bell/phone within reach;with chair alarm set Nurse Communication: Mobility status PT Visit Diagnosis: Other abnormalities of gait and mobility (R26.89);Difficulty in walking, not elsewhere classified (R26.2);Muscle weakness (generalized) (M62.81);Pain Pain - Right/Left: Left Pain - part of body: Hip     Time: 8485-8448 PT Time Calculation (min) (ACUTE ONLY): 37 min  Charges:                             Leontine Ingles, SPT 10/24/23, 4:42 PM

## 2023-10-24 NOTE — Care Management Obs Status (Signed)
 MEDICARE OBSERVATION STATUS NOTIFICATION   Patient Details  Name: [name] MRN: [id number] Date of Birth: 1933/01/03   Medicare Observation Status Notification Given:  Yes    Taylynn Easton W, CMA 10/24/2023, 1:42 PM

## 2023-10-24 NOTE — Progress Notes (Signed)
 Patient ID: [name], female   DOB: 1933/03/28, 89 y.o.   MRN: [id number]  Subjective: The patient is resting comfortably in bed.  She has no new complaints pertaining to the left hip.  She did participate in physical therapy yesterday but had difficulty with lifting her left leg and with mobilization due to her left hip pain.   Objective: Vital signs in last 24 hours: Temp:  [97 F (36.1 C)-98.8 F (37.1 C)] 98 F (36.7 C) (07/09 0413) Pulse Rate:  [99-129] 99 (07/09 0413) Resp:  [16-18] 18 (07/09 0413) BP: (113-158)/(63-77) 158/77 (07/09 0413) SpO2:  [94 %-100 %] 100 % (07/09 0413)  Intake/Output from previous day: No intake/output data recorded. Intake/Output this shift: No intake/output data recorded.  Recent Labs    10/22/23 1818 10/22/23 1843 10/23/23 0300 10/24/23 0431  HGB 13.8 13.9 13.0 11.6*   Recent Labs    10/23/23 0300 10/24/23 0431  WBC 10.5 6.7  RBC 3.80* 3.43*  HCT 38.5 35.0*  PLT 117* 104*   Recent Labs    10/23/23 0300 10/24/23 0431  NA 141 142  K 3.7 4.2  CL 102 104  CO2 28 29  BUN 21 21  CREATININE 1.22* 1.02*  GLUCOSE 136* 101*  CALCIUM  9.5 9.3   Recent Labs    10/22/23 1818  INR 1.2    Physical Exam: Orthopedic examination again is limited to left hip and lower extremity.  Overall, the findings are unchanged as compared to yesterday.  She experiences mild tenderness to palpation over the lateral aspect of the hip.  She has increased pain with attempted active or passive motion of the hip.  She remains grossly neurovascularly intact to the left lower extremity and foot.  Assessment: Essentially nondisplaced left anterior column and inferior pubic rami fractures.  Plan: The treatment options have been reviewed with the patient.  The patient may continue to be mobilized with physical therapy, weightbearing as tolerated on the left leg and using a walker for balance and support.  She may receive medication for pain as deemed  appropriate medically.  Again, the patient most likely will require skilled rehab placement to help improve her overall function and safety with ambulation.  Thank you for asked me to participate in the care of this most pleasant yet unfortunate woman.  I will sign off at this time.  Please make arrangements for the patient to be followed up in our office in 4 to 6 weeks with one of our physician assistants.  If you have further need of orthopedic input during this hospitalization, please reconsult me.   Riel Hirschman J Constantinos Krempasky 10/24/2023, 7:30 AM

## 2023-10-25 DIAGNOSIS — I5032 Chronic diastolic (congestive) heart failure: Secondary | ICD-10-CM

## 2023-10-25 DIAGNOSIS — S32402A Unspecified fracture of left acetabulum, initial encounter for closed fracture: Secondary | ICD-10-CM | POA: Diagnosis not present

## 2023-10-25 DIAGNOSIS — I480 Paroxysmal atrial fibrillation: Secondary | ICD-10-CM | POA: Diagnosis not present

## 2023-10-25 MED ORDER — HYDROCODONE-ACETAMINOPHEN 5-325 MG PO TABS: 1.0000 | ORAL_TABLET | Freq: Four times a day (QID) | ORAL | 0 refills | Status: DC | PRN

## 2023-10-25 MED ORDER — LAMOTRIGINE 25 MG PO TABS: 25.0000 mg | ORAL_TABLET | Freq: Every day | ORAL | 0 refills | Status: AC

## 2023-10-25 NOTE — TOC Progression Note (Signed)
 Transition of Care [facility]) - Progression Note    Patient Details  Name: Mylah Baynes Barrett MRN: [id number] Date of Birth: 08/02/1932  Transition of Care [facility]) CM/SW Contact  [place] L Anieya Helman, [place] Phone Number: 10/25/2023, 10:04 AM  Clinical Narrative:     CSW spoke with patient daughter. CSW advised of bed choice. Daughter advised that she would like her mother to go to [facility]. Auth started.        Expected Discharge Plan and Services                                               Social Determinants of Health (SDOH) Interventions SDOH Screenings   Food Insecurity: Patient Unable To Answer (10/23/2023)  Housing: Patient Unable To Answer (10/23/2023)  Transportation Needs: Patient Unable To Answer (10/23/2023)  Utilities: Patient Unable To Answer (10/23/2023)  Financial Resource Strain: Low Risk  (02/12/2023)   Received from [facility]  Social Connections: Patient Unable To Answer (10/23/2023)  Tobacco Use: Low Risk  (10/23/2023)    Readmission Risk Interventions     No data to display

## 2023-10-25 NOTE — TOC Transition Note (Signed)
 Transition of Care [facility]) - Discharge Note   Patient Details  Name: [name] MRN: [id number] Date of Birth: 1933/03/15  Transition of Care [facility]) CM/SW Contact:  [place] L Nyaira Hodgens, LCSW Phone Number: 10/25/2023, 12:31 PM   Clinical Narrative:     Patient received immediate authorization for placement at [facility]. Patient will be transported via [facility]. Family notified of transfer.   No further TOC needs identified.   Final next level of care: Skilled Nursing Facility Barriers to Discharge: No Barriers Identified   Patient Goals and CMS Choice   CMS Medicare.gov Compare Post Acute Care list provided to:: Patient Choice offered to / list presented to : Patient      Discharge Placement              Patient chooses bed at: [facility] Patient to be transferred to facility by: [facility] Name of family member notified: Jodie Gooch Patient and family notified of of transfer: 10/25/23  Discharge Plan and Services Additional resources added to the After Visit Summary for                                       Social Drivers of Health (SDOH) Interventions SDOH Screenings   Food Insecurity: Patient Unable To Answer (10/23/2023)  Housing: Patient Unable To Answer (10/23/2023)  Transportation Needs: Patient Unable To Answer (10/23/2023)  Utilities: Patient Unable To Answer (10/23/2023)  Financial Resource Strain: Low Risk  (02/12/2023)   Received from [facility]  Social Connections: Patient Unable To Answer (10/23/2023)  Tobacco Use: Low Risk  (10/23/2023)     Readmission Risk Interventions     No data to display

## 2023-10-25 NOTE — TOC Progression Note (Signed)
 Transition of Care [facility]) - Progression Note    Patient Details  Name: [name] MRN: [id number] Date of Birth: June 16, 1932  Transition of Care [facility]) CM/SW Contact  [name], [place] Phone Number: 10/25/2023, 11:51 AM  Clinical Narrative:     Shara approval received. CSW contacted patient daughter to discuss. Daughter was agreeable to her mother being transported today if patient was medically ready to discharge.        Expected Discharge Plan and Services                                               Social Determinants of Health (SDOH) Interventions SDOH Screenings   Food Insecurity: Patient Unable To Answer (10/23/2023)  Housing: Patient Unable To Answer (10/23/2023)  Transportation Needs: Patient Unable To Answer (10/23/2023)  Utilities: Patient Unable To Answer (10/23/2023)  Financial Resource Strain: Low Risk  (02/12/2023)   Received from [facility]  Social Connections: Patient Unable To Answer (10/23/2023)  Tobacco Use: Low Risk  (10/23/2023)    Readmission Risk Interventions     No data to display

## 2023-10-25 NOTE — Progress Notes (Signed)
 Occupational Therapy Treatment Patient Details Name: [name] MRN: [id number] DOB: [birth year] Today's Date: 10/25/2023   History of present illness Pt is an 89 yo female s/p fall at home, seen at ED and sent home, sent back to ED from facility [facility]) due to inability to weight bear due to L hip fractures. Imaging showed nondisplaced fracture through the anterior column of the left acetabulum, as well as a nondisplaced inferior pubic ramus fracture. Workup for afib with RVR as well. PMH of DVT, HTN, HLD, dementia, PAD, afib, CHF, CKD, breast cancer.   OT comments  Pt seen for OT/PT co-treatment on this date. Upon arrival to room pt supine in bed, agreeable to Tx session. OT facilitated ADL management with assistance as described below. See ADL section for additional details regarding occupational performance. Pt continues to be functionally limited by increased LLE pain with mobility, generalized weakness, and decreased activity tolerance. Pt return verbalizes understanding of education provided t/o session. Pt is progressing toward OT goals and continues to benefit from skilled OT services to maximize return to PLOF and minimize risk of future falls, injury, caregiver burden, and readmission. Will continue to follow POC as written. Discharge recommendation remains appropriate.        If plan is discharge home, recommend the following:  Two people to help with walking and/or transfers;A lot of help with bathing/dressing/bathroom;Supervision due to cognitive status   Equipment Recommendations  Other (comment)    Recommendations for Other Services      Precautions / Restrictions Precautions Precautions: Fall Recall of Precautions/Restrictions: Impaired Restrictions Weight Bearing Restrictions Per Provider Order: Yes LLE Weight Bearing Per Provider Order: Weight bearing as tolerated       Mobility Bed Mobility Overal bed mobility: Needs Assistance Bed Mobility:  Supine to Sit Rolling: Max assist, +2 for physical assistance   Supine to sit: Max assist, +2 for physical assistance, Used rails, HOB elevated          Transfers Overall transfer level: Needs assistance Equipment used: Rolling walker (2 wheels) Transfers: Sit to/from Stand Sit to Stand: Mod assist, +2 physical assistance     Step pivot transfers: Mod assist, +2 physical assistance     General transfer comment: mod +2 assist bed to [facility] as patient urinating upon standing     Balance Overall balance assessment: Needs assistance Sitting-balance support: Feet supported Sitting balance-[name]: Fair     Standing balance support: Bilateral upper extremity supported, During functional activity, Reliant on assistive device for balance Standing balance-[name]: Good Standing balance comment: Static standing balance is good, able to attempt peri care without UE support. Patient has poor dynamic balance                           ADL either performed or assessed with clinical judgement   ADL Overall ADL's : Needs assistance/impaired                     Lower Body Dressing: Maximal assistance;Sit to/from stand Lower Body Dressing Details (indicate cue type and reason): MAX A to doff/don hospital socks Toilet Transfer: +2 for physical assistance;Minimal assistance;Contact guard assist;BSC/3in1;Rolling walker (2 wheels) Toilet Transfer Details (indicate cue type and reason): Initial +2 MOD A for STS, but is able to take some pivoting steps to [facility] with CGA and RW Toileting- Clothing Manipulation and Hygiene: Maximal assistance;Sit to/from stand Toileting - Clothing Manipulation Details (indicate cue type and  reason): MAX A for peri-care     Functional mobility during ADLs: Moderate assistance;+2 for physical assistance;+2 for safety/equipment;Minimal assistance      Extremity/Trunk Assessment              Vision Patient Visual Report: No change from  baseline     Perception     Praxis     Communication Communication Communication: Impaired Factors Affecting Communication: Hearing impaired   Cognition Arousal: Alert Behavior During Therapy: WFL for tasks assessed/performed Cognition: No family/caregiver present to determine baseline                               Following commands: Impaired Following commands impaired: Follows one step commands inconsistently, Follows one step commands with increased time      Cueing   Cueing Techniques: Verbal cues, Gestural cues, Tactile cues  Exercises Other Exercises Other Exercises: OT facilitated ADL management with assist/edu as described above. See ADL section for details.    Shoulder Instructions       General Comments      Pertinent Vitals/ Pain       Pain Assessment Pain Assessment: Faces Faces Pain Scale: Hurts even more Pain Location: L hip, Pain Descriptors / Indicators: Discomfort, Grimacing Pain Intervention(s): Limited activity within patient's tolerance, Monitored during session, Repositioned  Home Living                                          Prior Functioning/Environment              Frequency  Min 2X/week        Progress Toward Goals  OT Goals(current goals can now be found in the care plan section)  Progress towards OT goals: Progressing toward goals  Acute Rehab OT Goals Patient Stated Goal: to get better OT Goal Formulation: With patient Time For Goal Achievement: 11/06/23 Potential to Achieve Goals: Fair  Plan      Co-evaluation    PT/OT/SLP Co-Evaluation/Treatment: Yes Reason for Co-Treatment: Necessary to address cognition/behavior during functional activity;For patient/therapist safety;To address functional/ADL transfers PT goals addressed during session: Mobility/safety with mobility;Balance;Proper use of DME OT goals addressed during session: ADL's and self-care;Proper use of Adaptive equipment  and DME      AM-PAC OT 6 Clicks Daily Activity     Outcome Measure   Help from another person eating meals?: None Help from another person taking care of personal grooming?: A Little Help from another person toileting, which includes using toliet, bedpan, or urinal?: A Lot Help from another person bathing (including washing, rinsing, drying)?: A Lot Help from another person to put on and taking off regular upper body clothing?: A Little Help from another person to put on and taking off regular lower body clothing?: A Lot 6 Click Score: 16    End of Session Equipment Utilized During Treatment: Gait belt;Rolling walker (2 wheels)  OT Visit Diagnosis: Unsteadiness on feet (R26.81);Repeated falls (R29.6);Muscle weakness (generalized) (M62.81)   Activity Tolerance Patient tolerated treatment well   Patient Left in chair;with call bell/phone within reach;with chair alarm set   Nurse Communication          Time: 8948-8875 OT Time Calculation (min): 33 min  Charges: OT General Charges $OT Visit: 1 Visit OT Treatments $Self Care/Home Management : 8-22 mins  Jhonny Pelton, M.S., OTR/L  10/25/23, 1:19 PM

## 2023-10-25 NOTE — Plan of Care (Signed)

## 2023-10-25 NOTE — Progress Notes (Signed)
 Physical Therapy Treatment Patient Details Name: [name] MRN: [id number] DOB: [birth year] Today's Date: 10/25/2023   History of Present Illness Pt is an 89 yo female s/p fall at home, seen at ED and sent home, sent back to ED from facility [facility]) due to inability to weight bear due to L hip fractures. Imaging showed nondisplaced fracture through the anterior column of the left acetabulum, as well as a nondisplaced inferior pubic ramus fracture. Workup for afib with RVR as well. PMH of DVT, HTN, HLD, dementia, PAD, afib, CHF, CKD, breast cancer.    PT Comments  Patient received in bed, asking for more coffee. She is agreeable to PT session. Patient required max +2 assist for bed mobility. Transfers with mod +2 assist max cues. Patient transferred to [facility] and then was able to take a few steps with max cues and encouragement. Patient will continue to benefit from skilled PT to improve functional independence and safety.      If plan is discharge home, recommend the following: Two people to help with walking and/or transfers;Help with stairs or ramp for entrance;Assist for transportation;Assistance with feeding;Assistance with cooking/housework;Supervision due to cognitive status;A lot of help with bathing/dressing/bathroom   Can travel by private vehicle     No  Equipment Recommendations  None recommended by PT    Recommendations for Other Services       Precautions / Restrictions Precautions Precautions: Fall Recall of Precautions/Restrictions: Impaired Restrictions Weight Bearing Restrictions Per Provider Order: Yes LLE Weight Bearing Per Provider Order: Weight bearing as tolerated     Mobility  Bed Mobility Overal bed mobility: Needs Assistance Bed Mobility: Supine to Sit     Supine to sit: Max assist, +2 for physical assistance, Used rails, HOB elevated          Transfers Overall transfer level: Needs assistance Equipment used: Rolling walker (2  wheels) Transfers: Sit to/from Stand Sit to Stand: Mod assist, +2 physical assistance   Step pivot transfers: Mod assist, +2 physical assistance       General transfer comment: mod +2 assist bed to [facility] as patient urinating upon standing    Ambulation/Gait Ambulation/Gait assistance: Contact guard assist, +2 physical assistance, +2 safety/equipment Gait Distance (Feet): 3 Feet Assistive device: Rolling walker (2 wheels) Gait Pattern/deviations: Step-to pattern, Decreased step length - right, Decreased step length - left Gait velocity: decr     General Gait Details: Patient having difficulty weight shifting to L LE due to pain. Able to take a few steps with max cues and encouragement   Stairs             Wheelchair Mobility     Tilt Bed    Modified Rankin (Stroke Patients Only)       Balance Overall balance assessment: Needs assistance Sitting-balance support: Feet supported Sitting balance-[name]: Fair     Standing balance support: Bilateral upper extremity supported, During functional activity, Reliant on assistive device for balance Standing balance-[name]: Good Standing balance comment: Static standing balance is good, able to perform peri care without UE support. Patient has poor dynamic balance                            Communication Communication Communication: Impaired Factors Affecting Communication: Hearing impaired  Cognition Arousal: Alert Behavior During Therapy: WFL for tasks assessed/performed   PT - Cognitive impairments: History of cognitive impairments  Following commands: Impaired Following commands impaired: Follows one step commands inconsistently, Follows one step commands with increased time    Cueing Cueing Techniques: Verbal cues, Gestural cues, Tactile cues  Exercises      General Comments        Pertinent Vitals/Pain Pain Assessment Pain Assessment: Faces Faces  Pain Scale: Hurts even more Pain Location: L hip, Pain Descriptors / Indicators: Discomfort, Grimacing Pain Intervention(s): Monitored during session, Repositioned    Home Living                          Prior Function            PT Goals (current goals can now be found in the care plan section) Acute Rehab PT Goals Patient Stated Goal: to have less pain PT Goal Formulation: With patient Time For Goal Achievement: 11/06/23 Potential to Achieve Goals: Good Progress towards PT goals: Progressing toward goals    Frequency    Min 3X/week      PT Plan      Co-evaluation PT/OT/SLP Co-Evaluation/Treatment: Yes Reason for Co-Treatment: Necessary to address cognition/behavior during functional activity;For patient/therapist safety;To address functional/ADL transfers PT goals addressed during session: Mobility/safety with mobility;Balance;Proper use of DME        AM-PAC PT 6 Clicks Mobility   Outcome Measure  Help needed turning from your back to your side while in a flat bed without using bedrails?: A Lot Help needed moving from lying on your back to sitting on the side of a flat bed without using bedrails?: A Lot Help needed moving to and from a bed to a chair (including a wheelchair)?: A Lot Help needed standing up from a chair using your arms (e.g., wheelchair or bedside chair)?: A Lot Help needed to walk in hospital room?: A Lot Help needed climbing 3-5 steps with a railing? : Total 6 Click Score: 11    End of Session Equipment Utilized During Treatment: Gait belt Activity Tolerance: Patient limited by fatigue;Patient limited by pain Patient left: in chair;with call bell/phone within reach;with chair alarm set Nurse Communication: Mobility status PT Visit Diagnosis: Other abnormalities of gait and mobility (R26.89);Muscle weakness (generalized) (M62.81);Pain;Difficulty in walking, not elsewhere classified (R26.2);History of falling (Z91.81) Pain -  Right/Left: Left Pain - part of body: Hip     Time: 8947-8874 PT Time Calculation (min) (ACUTE ONLY): 33 min  Charges:    $Gait Training: 8-22 mins PT General Charges $$ ACUTE PT VISIT: 1 Visit                     Quientin Jent, PT, GCS 10/25/23,1:14 PM

## 2023-10-25 NOTE — Discharge Summary (Signed)
 Physician Discharge Summary   Patient: [name] MRN: [id number] DOB: 1932/06/24  Admit date:     10/23/2023  Discharge date: 10/25/23  Discharge Physician: Murvin Mana   PCP: Cleotilde Oneil FALCON, MD   Recommendations at discharge:   Follow-up with PCP in 1 week. Follow-up with orthopedics in 2 weeks  Discharge Diagnoses: Principal Problem:   Closed nondisplaced fracture of acetabulum (HCC) Active Problems:   PAF (paroxysmal atrial fibrillation) (HCC)   Thrombocytopenia (HCC)   Hypothyroidism due to acquired atrophy of thyroid    Chronic diastolic congestive heart failure (HCC)   PAD (peripheral artery disease) (HCC)   Ductal carcinoma in situ (DCIS) of right breast   CKD stage 3b, GFR 30-44 ml/min (HCC)   Dyslipidemia   Inferior pubic ramus fracture, left, closed, initial encounter (HCC)  Resolved Problems:   * No resolved hospital problems. [facility] Course: Patient with PMH of paroxysmal A-fib, chronic HFpEF, chronic thrombocytopenia, hypothyroidism, PAD, CKD 3B, HLD, right breast cancer presented to the hospital with complaints of a mechanical fall. Underwent x-ray chest, x-ray pelvis, x-ray of femur right, x-ray left forearm, CT head as well as CT C-spine workup in the ED which was unremarkable for any acute fracture.  CT of the hip showed nondisplaced fracture of left vestibular as well as left inferior pubic rami fracture .  Patient is seen by orthopedics, no need for surgery.  However, PT/OT recommend nursing home placement.  Patient is medically stable for discharge today.   Assessment and Plan: Close nondisplaced fracture of left vestibular as well as left inferior pubic rami fracture. Mechanical fall with left hip pain. Patient has nondisplaced pelvic fracture, no need for surgery.  Continue symptomatic control.  Patient need SNF placement, has insurance approval.  Will discharge today.   Paroxysmal A-fib. Chronic thrombocytopenia. Continue beta-blocker as well  as Eliquis .   Dysuria. Patient has complaints of dysuria, no sepsis.  UA does not have any evidence of UTI     CKD 3B. Renal function stable   HTN. Chronic HFpEF. Echo 2019 EF 55 to 60%. Blood pressure stable, no exacerbation congestive heart failure.    Mood disorder. On BuSpar  and Zoloft .     Reported history of dementia. Continue to follow   History of breast cancer.          Consultants: Orthopedics Procedures performed: None  Disposition: Skilled nursing facility Diet recommendation:  Discharge Diet Orders (From admission, onward)     Start     Ordered   10/25/23 0000  Diet - low sodium heart healthy        10/25/23 1157           Cardiac diet DISCHARGE MEDICATION: Allergies as of 10/25/2023       Reactions   Other Other (See Comments)   NO BP, VENIPUNCTURE, OR ACCESS IN RIGHT ARM - S/P MASTECTOMY ON RIGHT   Amlodipine  Nausea And Vomiting   Apixaban     Ciprofloxacin Nausea Only   Donepezil  Other (See Comments)   Doxycycline  Itching, Other (See Comments)   shaky   Galantamine Other (See Comments)   Hydrocodone  Other (See Comments)   Suicidal   Lipitor [atorvastatin] Other (See Comments)   Joint pain   Morphine  And Codeine Nausea And Vomiting   Tramadol     Macrobid [nitrofurantoin Macrocrystal] Rash   Penicillins Rash, Other (See Comments)   Tolerated 1st generation cephalosporin (CEFAZOLIN ) on 08/20/2013 and 01/19/2021 without documented ADRs. PCN reaction causing immediate rash, facial/tongue/throat swelling, SOB  or lightheadedness with hypotension: yes PCN reaction causing severe rash involving mucus membranes or skin necrosis: no PCN reaction that required hospitalization no PCN reaction occurring within the last 10 years: no If all of the above answers are NO, then may proceed with Cephalosporin use.   Sulfa Antibiotics Rash        Medication List     STOP taking these medications    buPROPion 75 MG tablet Commonly known as:  WELLBUTRIN   escitalopram 10 MG tablet Commonly known as: LEXAPRO   spironolactone  25 MG tablet Commonly known as: ALDACTONE    traMADol  50 MG tablet Commonly known as: ULTRAM    Vitamin D3 50 MCG (2000 UT) Tabs       TAKE these medications    acetaminophen  500 MG tablet Commonly known as: TYLENOL  Take 500-650 mg by mouth 2 (two) times daily.   busPIRone  15 MG tablet Commonly known as: BUSPAR  Take 15 mg by mouth 2 (two) times daily.   calcium  carbonate 750 MG chewable tablet Commonly known as: TUMS EX Chew 2 tablets by mouth every 6 (six) hours as needed.   CENTROVITE Tabs Take 1 tablet by mouth daily.   cetirizine 5 MG tablet Commonly known as: ZYRTEC Take 5 mg by mouth daily.   Eliquis  2.5 MG Tabs tablet Generic drug: apixaban  Take 2.5 mg by mouth 2 (two) times daily.   HYDROcodone -acetaminophen  5-325 MG tablet Commonly known as: NORCO/VICODIN Take 1 tablet by mouth every 6 (six) hours as needed. What changed: Another medication with the same name was removed. Continue taking this medication, and follow the directions you see here.   lamoTRIgine  25 MG tablet Commonly known as: LAMICTAL  Take 1 tablet (25 mg total) by mouth daily.   levothyroxine  100 MCG tablet Commonly known as: SYNTHROID  Take 100 mcg by mouth daily before breakfast. Take on an empty stomach with a glass of water at least 30 to 60 minutes before breakfast. What changed: Another medication with the same name was removed. Continue taking this medication, and follow the directions you see here.   metoprolol  succinate 25 MG 24 hr tablet Commonly known as: TOPROL -XL Take 25 mg by mouth daily.   MiraLax  17 GM/SCOOP powder Generic drug: polyethylene glycol powder Take 17 g by mouth daily as needed for mild constipation.   psyllium 0.52 g capsule Commonly known as: REGULOID Take 1 capsule by mouth daily.   sertraline  50 MG tablet Commonly known as: ZOLOFT  Take 50 mg by mouth daily.         Contact information for follow-up providers     Cleotilde Oneil FALCON, MD Follow up in 1 week(s).   Specialty: Internal Medicine Contact information: [phone] [facility] [address] [facility] [place] [facility] [place] [address] [phone]         Poggi, Norleen PARAS, MD Follow up in 2 week(s).   Specialty: Orthopedic Surgery Contact information: [address] [facility] [place] [address] [phone]              Contact information for after-discharge care     Destination     [facility] .   Service: Skilled Nursing Contact information: [address] [place]  [address] [phone]                    Discharge Exam: Fredricka Weights   10/23/23 0121  Weight: 55.8 kg   General exam: Appears calm and comfortable  Respiratory system: Clear to auscultation. Respiratory effort normal. Cardiovascular system: S1 & S2 heard, RRR. No JVD, murmurs, rubs, gallops or clicks. No pedal edema. Gastrointestinal system: Abdomen is nondistended, soft and nontender. No organomegaly or masses felt. Normal bowel sounds heard. Central nervous system: Alert and oriented. No focal neurological deficits. Extremities: Symmetric 5 x 5 power. Skin: No rashes, lesions or ulcers Psychiatry: Judgement and insight appear normal. Mood & affect appropriate.    Condition at discharge: good  The results of significant diagnostics from this hospitalization (including imaging, microbiology, ancillary and laboratory) are listed below for reference.   Imaging Studies: CT FEMUR LEFT WO CONTRAST Result Date: 10/23/2023 CLINICAL DATA:  89 year old with fall injury yesterday and continued pain and unable to bear weight. EXAM: CT OF THE LEFT THIGH AND FEMUR WITHOUT CONTRAST TECHNIQUE: Multidetector CT imaging of the left thigh and femur was performed according to the standard protocol. RADIATION DOSE  REDUCTION: This exam was performed according to the departmental dose-optimization program which includes automated exposure control, adjustment of the mA and/or kV according to patient size and/or use of iterative reconstruction technique. COMPARISON:  AP pelvis yesterday, CT scan left knee 12/19/2021. FINDINGS: Bones/Joint/Cartilage Generalized osteopenia. There is a nondisplaced acute intra-articular fracture through the anterior column of the left acetabulum and a nondisplaced fracture of the mid left inferior pubic ramus. There is minimal fluid in the left hip joint. There are 3 threaded hip pins in the proximal left femur and a healed proximal left femoral fracture deformity. No acute left femoral fracture is seen. Assessment for nondisplaced fractures is limited in the distal femur and proximal tibia due to femorotibial joint replacement hardware. Visualized portions of the proximal tibia and fibula are unremarkable as well as can be seen. No displaced patellar fracture or patellar dislocation is seen. There is a small suprapatellar bursal lipohemarthrosis, which was significantly larger in 2023. This may be seen in the setting of an occult fracture or could be residual from the prior injury. Ligaments Suboptimally assessed by CT. Muscles and Tendons No acute noncontrast CT findings. No intramuscular hematoma is evident without contrast. There is mild atrophy of the left gluteus medius and minimus muscles. Soft tissues There is moderate stranding and patchy subcutaneous hemorrhage laterally at the level of the left hip, largest individual hematoma measuring 5.1 x 3.4 x 2.3 cm. There are heavy vascular calcifications. No hematoma or fluid is seen within the left hemipelvis. IMPRESSION: 1. Nondisplaced acute intra-articular fracture through the anterior column of the left acetabulum. 2. Nondisplaced fracture of the mid left inferior pubic ramus. 3. Osteopenia. 4. No acute left femoral fracture is seen. 5. Small  suprapatellar bursal lipohemarthrosis, which was significantly larger in 2023. This may be seen in the setting of an occult fracture or could be residual from the prior injury. 6. Moderate stranding and patchy subcutaneous hemorrhage laterally at the level of the left hip, largest individual hematoma measuring 5.1 x 3.4 x 2.3 cm. 7. Heavy vascular calcifications. 8. Mild atrophy of the left gluteus medius and minimus muscles. Electronically Signed   By: Francis Quam M.D.   On: 10/23/2023 03:31   DG Pelvis 1-2 Views Result Date: 10/22/2023 CLINICAL DATA:  trauma EXAM: PELVIS - 1-2 VIEW COMPARISON:  None Available. FINDINGS: Three screw fixation of the left femoral neck. No acute displaced fracture or dislocation of the right hip. No acute displaced fracture or diastasis of the bones of the pelvis. Degenerative changes of the lower lumbar spine with kyphoplasty at the  L3 level. Degenerative changes of visualized lower lumbar spine. No pelvic bone lesions are seen. IMPRESSION: Negative for acute traumatic injury. Electronically Signed   By: Morgane  Naveau M.D.   On: 10/22/2023 19:32   DG Femur Portable Min 2 Views Right Result Date: 10/22/2023 CLINICAL DATA:  trauma EXAM: RIGHT FEMUR PORTABLE 2 VIEW COMPARISON:  None Available. FINDINGS: There is no evidence of fracture or other focal bone lesions. Soft tissues are unremarkable. Partially visualized total right knee arthroplasty. Vascular calcification. IMPRESSION: Negative for acute traumatic injury. Electronically Signed   By: Morgane  Naveau M.D.   On: 10/22/2023 19:31   DG Forearm Left Result Date: 10/22/2023 CLINICAL DATA:  fall EXAM: LEFT FOREARM - 2 VIEW COMPARISON:  None Available. FINDINGS: There is no evidence of fracture or other focal bone lesions. Degenerative changes of the wrist. Soft tissues are unremarkable. IMPRESSION: No acute displaced fracture or dislocation. Electronically Signed   By: Morgane  Naveau M.D.   On: 10/22/2023 19:29   CT  Head Wo Contrast Result Date: 10/22/2023 CLINICAL DATA:  Neck trauma (Age >= 65y); Head trauma, minor (Age >= 65y) EXAM: CT HEAD WITHOUT CONTRAST CT CERVICAL SPINE WITHOUT CONTRAST TECHNIQUE: Multidetector CT imaging of the head and cervical spine was performed following the standard protocol without intravenous contrast. Multiplanar CT image reconstructions of the cervical spine were also generated. RADIATION DOSE REDUCTION: This exam was performed according to the departmental dose-optimization program which includes automated exposure control, adjustment of the mA and/or kV according to patient size and/or use of iterative reconstruction technique. COMPARISON:  None Available. FINDINGS: CT HEAD FINDINGS Brain: Cerebral ventricle sizes are concordant with the degree of cerebral volume loss. Patchy and confluent areas of decreased attenuation are noted throughout the deep and periventricular white matter of the cerebral hemispheres bilaterally, compatible with chronic microvascular ischemic disease. No evidence of large-territorial acute infarction. No parenchymal hemorrhage. No mass lesion. No extra-axial collection. No mass effect or midline shift. No hydrocephalus. Basilar cisterns are patent. Vascular: No hyperdense vessel. Skull: No acute fracture or focal lesion. Sinuses/Orbits: Paranasal sinuses and mastoid air cells are clear. Bilateral lens replacement. Otherwise the orbits are unremarkable. Other: 3 mm left frontal scalp hematoma. CT CERVICAL SPINE FINDINGS Alignment: Normal. Skull base and vertebrae: No acute fracture. No aggressive appearing focal osseous lesion or focal pathologic process. Soft tissues and spinal canal: No prevertebral fluid or swelling. No visible canal hematoma. Upper chest: Biapical pleural/pulmonary scarring. Other: Atherosclerotic plaque of the carotid arteries within the neck. IMPRESSION: 1. No acute intracranial abnormality. 2. No acute displaced fracture or traumatic listhesis  of the cervical spine. Electronically Signed   By: Morgane  Naveau M.D.   On: 10/22/2023 19:28   CT Cervical Spine Wo Contrast Result Date: 10/22/2023 CLINICAL DATA:  Neck trauma (Age >= 65y); Head trauma, minor (Age >= 65y) EXAM: CT HEAD WITHOUT CONTRAST CT CERVICAL SPINE WITHOUT CONTRAST TECHNIQUE: Multidetector CT imaging of the head and cervical spine was performed following the standard protocol without intravenous contrast. Multiplanar CT image reconstructions of the cervical spine were also generated. RADIATION DOSE REDUCTION: This exam was performed according to the departmental dose-optimization program which includes automated exposure control, adjustment of the mA and/or kV according to patient size and/or use of iterative reconstruction technique. COMPARISON:  None Available. FINDINGS: CT HEAD FINDINGS Brain: Cerebral ventricle sizes are concordant with the degree of cerebral volume loss. Patchy and confluent areas of decreased attenuation are noted throughout the deep and periventricular white matter of the cerebral  hemispheres bilaterally, compatible with chronic microvascular ischemic disease. No evidence of large-territorial acute infarction. No parenchymal hemorrhage. No mass lesion. No extra-axial collection. No mass effect or midline shift. No hydrocephalus. Basilar cisterns are patent. Vascular: No hyperdense vessel. Skull: No acute fracture or focal lesion. Sinuses/Orbits: Paranasal sinuses and mastoid air cells are clear. Bilateral lens replacement. Otherwise the orbits are unremarkable. Other: 3 mm left frontal scalp hematoma. CT CERVICAL SPINE FINDINGS Alignment: Normal. Skull base and vertebrae: No acute fracture. No aggressive appearing focal osseous lesion or focal pathologic process. Soft tissues and spinal canal: No prevertebral fluid or swelling. No visible canal hematoma. Upper chest: Biapical pleural/pulmonary scarring. Other: Atherosclerotic plaque of the carotid arteries within  the neck. IMPRESSION: 1. No acute intracranial abnormality. 2. No acute displaced fracture or traumatic listhesis of the cervical spine. Electronically Signed   By: Morgane  Naveau M.D.   On: 10/22/2023 19:28   DG Chest Portable 1 View Result Date: 10/22/2023 CLINICAL DATA:  trauma.  Fall EXAM: PORTABLE CHEST 1 VIEW COMPARISON:  Chest x-ray 08/22/2022, CT chest 08/22/2022 FINDINGS: The heart and mediastinal contours are within normal limits. Atherosclerotic plaque including mitral annular calcification. No focal consolidation. No pulmonary edema. No pleural effusion. No pneumothorax. No acute osseous abnormality. Dextroscoliosis of the thoracic spine. IMPRESSION: 1. No active disease. 2. Aortic Atherosclerosis (ICD10-I70.0) including mitral annular calcification. Electronically Signed   By: Morgane  Naveau M.D.   On: 10/22/2023 19:25    Microbiology: Results for orders placed or performed during the hospital encounter of 01/29/21  Urine Culture     Status: Abnormal   Collection Time: 01/29/21  4:04 AM   Specimen: Urine, Random  Result Value Ref Range Status   Specimen Description   Final    URINE, RANDOM Performed at [facility], [address], [place], [place] [address]    Special Requests   Final    NONE Performed at [facility], [address] [place], [place] [address]    Culture (A)  Final    >=100,000 COLONIES/mL CITROBACTER KOSERI 50,000 COLONIES/mL CITROBACTER BRAAKII    Report Status 02/01/2021 FINAL  Final   Organism ID, Bacteria CITROBACTER KOSERI (A)  Final   Organism ID, Bacteria CITROBACTER BRAAKII (A)  Final      Susceptibility   Citrobacter braakii - MIC*    CEFAZOLIN  >=64 RESISTANT Resistant     CEFEPIME <=0.12 SENSITIVE Sensitive     CEFTRIAXONE  >=64 RESISTANT Resistant     CIPROFLOXACIN <=0.25 SENSITIVE Sensitive     GENTAMICIN <=1 SENSITIVE Sensitive     IMIPENEM <=0.25 SENSITIVE Sensitive     NITROFURANTOIN <=16 SENSITIVE Sensitive      TRIMETH/SULFA <=20 SENSITIVE Sensitive     PIP/TAZO 16 SENSITIVE Sensitive     * 50,000 COLONIES/mL CITROBACTER BRAAKII   Citrobacter koseri - MIC*    CEFAZOLIN  <=4 SENSITIVE Sensitive     CEFEPIME <=0.12 SENSITIVE Sensitive     CEFTRIAXONE  <=0.25 SENSITIVE Sensitive     CIPROFLOXACIN <=0.25 SENSITIVE Sensitive     GENTAMICIN 8 INTERMEDIATE Intermediate     IMIPENEM <=0.25 SENSITIVE Sensitive     NITROFURANTOIN <=16 SENSITIVE Sensitive     TRIMETH/SULFA <=20 SENSITIVE Sensitive     PIP/TAZO 8 SENSITIVE Sensitive     * >=100,000 COLONIES/mL CITROBACTER KOSERI    Labs: CBC: Recent Labs  Lab 10/22/23 1818 10/22/23 1843 10/23/23 0300 10/24/23 0431  WBC 6.0  --  10.5 6.7  NEUTROABS  --   --  8.1*  --  HGB 13.8 13.9 13.0 11.6*  HCT 42.1 41.0 38.5 35.0*  MCV 101.9*  --  101.3* 102.0*  PLT 151  --  117* 104*   Basic Metabolic Panel: Recent Labs  Lab 10/22/23 1818 10/22/23 1843 10/23/23 0300 10/24/23 0431  NA 139 140 141 142  K 3.8 3.7 3.7 4.2  CL 102 102 102 104  CO2 23  --  28 29  GLUCOSE 90 87 136* 101*  BUN 21 23 21 21   CREATININE 1.41* 1.50* 1.22* 1.02*  CALCIUM  9.6  --  9.5 9.3   Liver Function Tests: Recent Labs  Lab 10/22/23 1818  AST 24  ALT 15  ALKPHOS 43  BILITOT 0.5  PROT 6.2*  ALBUMIN 3.9   CBG: No results for input(s): GLUCAP in the last 168 hours.  Discharge time spent: greater than 30 minutes.  Signed: Murvin Mana, MD [facility] 10/25/2023

## 2024-03-25 ENCOUNTER — Other Ambulatory Visit: Payer: Self-pay

## 2024-03-25 ENCOUNTER — Emergency Department

## 2024-03-25 ENCOUNTER — Inpatient Hospital Stay
Admission: EM | Admit: 2024-03-25 | Discharge: 2024-03-31 | Disposition: A | Attending: Internal Medicine | Admitting: Internal Medicine

## 2024-03-25 DIAGNOSIS — R4182 Altered mental status, unspecified: Secondary | ICD-10-CM

## 2024-03-25 LAB — COMPREHENSIVE METABOLIC PANEL WITH GFR
ALT: 10 U/L (ref 0–44)
AST: 24 U/L (ref 15–41)
Albumin: 4.4 g/dL (ref 3.5–5.0)
Alkaline Phosphatase: 72 U/L (ref 38–126)
Anion gap: 12 (ref 5–15)
BUN: 27 mg/dL — ABNORMAL HIGH (ref 8–23)
CO2: 25 mmol/L (ref 22–32)
Calcium: 10.1 mg/dL (ref 8.9–10.3)
Chloride: 104 mmol/L (ref 98–111)
Creatinine, Ser: 1.2 mg/dL — ABNORMAL HIGH (ref 0.44–1.00)
GFR, Estimated: 43 mL/min — ABNORMAL LOW (ref >60)
Glucose, Bld: 87 mg/dL (ref 70–99)
Potassium: 4 mmol/L (ref 3.5–5.1)
Sodium: 141 mmol/L (ref 135–145)
Total Bilirubin: 0.4 mg/dL (ref 0.0–1.2)
Total Protein: 6.8 g/dL (ref 6.5–8.1)

## 2024-03-25 LAB — CBC WITH DIFFERENTIAL/PLATELET
Abs Immature Granulocytes: 0.01 K/uL (ref 0.00–0.07)
Basophils Absolute: 0.1 K/uL (ref 0.0–0.1)
Basophils Relative: 1 %
Eosinophils Absolute: 0.1 K/uL (ref 0.0–0.5)
Eosinophils Relative: 2 %
HCT: 44.6 % (ref 36.0–46.0)
Hemoglobin: 15 g/dL (ref 12.0–15.0)
Immature Granulocytes: 0 %
Lymphocytes Relative: 24 %
Lymphs Abs: 1.3 K/uL (ref 0.7–4.0)
MCH: 32.4 pg (ref 26.0–34.0)
MCHC: 33.6 g/dL (ref 30.0–36.0)
MCV: 96.3 fL (ref 80.0–100.0)
Monocytes Absolute: 0.6 K/uL (ref 0.1–1.0)
Monocytes Relative: 11 %
Neutro Abs: 3.4 K/uL (ref 1.7–7.7)
Neutrophils Relative %: 62 %
Platelets: 168 K/uL (ref 150–400)
RBC: 4.63 MIL/uL (ref 3.87–5.11)
RDW: 14.4 % (ref 11.5–15.5)
WBC: 5.5 K/uL (ref 4.0–10.5)
nRBC: 0 % (ref 0.0–0.2)

## 2024-03-25 LAB — URINALYSIS, ROUTINE W REFLEX MICROSCOPIC
Bilirubin Urine: NEGATIVE
Glucose, UA: NEGATIVE mg/dL
Hgb urine dipstick: NEGATIVE
Ketones, ur: NEGATIVE mg/dL
Nitrite: NEGATIVE
Protein, ur: NEGATIVE mg/dL
Specific Gravity, Urine: 1.012 (ref 1.005–1.030)
Squamous Epithelial / HPF: 0 /HPF (ref 0–5)
WBC, UA: >50 WBC/hpf (ref 0–5)
pH: 7 (ref 5.0–8.0)

## 2024-03-25 LAB — TROPONIN T, HIGH SENSITIVITY: Troponin T High Sensitivity: 24 ng/L — ABNORMAL HIGH (ref 0–19)

## 2024-03-25 LAB — RESP PANEL BY RT-PCR (RSV, FLU A&B, COVID)  RVPGX2
Influenza A by PCR: NEGATIVE
Influenza B by PCR: NEGATIVE
Resp Syncytial Virus by PCR: NEGATIVE
SARS Coronavirus 2 by RT PCR: NEGATIVE

## 2024-03-25 MED ORDER — SODIUM CHLORIDE 0.9 % IV SOLN
1.0000 g | INTRAVENOUS | Status: AC
  Administered 2024-03-26: 1 g via INTRAVENOUS
  Filled 2024-03-25: qty 10

## 2024-03-25 MED ORDER — SODIUM CHLORIDE 0.9 % IV BOLUS
500.0000 mL | Freq: Once | INTRAVENOUS | Status: AC
  Administered 2024-03-25: 500 mL via INTRAVENOUS

## 2024-03-25 NOTE — ED Triage Notes (Signed)
 BIBEMS from [facility]. Staff from facility called EMS d/t pt having AMS. Per facility the AMS started earlier in the day but they were able to reorient her. About an hour ago pt started becoming very combative w/ staff at facility. Pt refused a urine sample at the facility. Pt is A&Ox4 at baseline per EMS. Pt is currently A&Ox2, disoriented to situation and time.   EMS VS: 136/86, 98% on RA, 136 CBG

## 2024-03-25 NOTE — ED Provider Notes (Signed)
 Patient admitted to the hospitalist service for the care of Dr. Cleatus.  Workup revealing of apparent urinary tract infection, will initiate Rocephin .  CT Head Wo Contrast Result Date: 03/25/2024 CLINICAL DATA:  Altered level of consciousness, combative EXAM: CT HEAD WITHOUT CONTRAST TECHNIQUE: Contiguous axial images were obtained from the base of the skull through the vertex without intravenous contrast. RADIATION DOSE REDUCTION: This exam was performed according to the departmental dose-optimization program which includes automated exposure control, adjustment of the mA and/or kV according to patient size and/or use of iterative reconstruction technique. COMPARISON:  10/22/2023 FINDINGS: Brain: Stable chronic small-vessel ischemic changes are seen within the basal ganglia and periventricular white matter. No evidence of acute infarct or hemorrhage. Lateral ventricles and midline structures are stable. No acute extra-axial fluid collections. No mass effect. Vascular: No hyperdense vessel or unexpected calcification. Skull: Normal. Negative for fracture or focal lesion. Sinuses/Orbits: No acute finding. Other: None. IMPRESSION: 1. Stable head CT, no acute intracranial process. Electronically Signed   By: Ozell Daring M.D.   On: 03/25/2024 22:27   Labs otherwise reassuring including metabolic panel with mild reduced GFR, CBC.  Troponin normal on second check   Dicky Anes, MD 03/26/24 [phone]

## 2024-03-25 NOTE — ED Provider Notes (Signed)
 [facility] Provider Note    Event Date/Time   First MD Initiated Contact with Patient 03/25/24 2135     (approximate)   History   Altered Mental Status   HPI  [name] is a 89 y.o. female with history of paroxysmal A-fib, HFpEF, thrombocytopenia, hypothyroidism, PAD, CKD, hyperlipidemia, and breast cancer who presents with apparent altered mental status.  Per EMS, the patient was noted to be altered this morning although it seemed to improve.  Within the last 1 to 2 hours, she became more altered and combative with staff at the facility.  They attempted to obtain a urine sample but were unable to.  Per EMS, the patient is alert and oriented x 4 at baseline.  The patient herself denies any acute complaints.  She is unable to give much history about why she is here.  She states that she does remember that people were holding her down, which likely corresponds to when they were trying to obtain a urinalysis.  She denies any acute pain.  She denies shortness of breath, dizziness, or nausea.  I reviewed the past medical records.  The patient was admitted to the hospital service in July with an acetabular fracture which did not require surgery.   Physical Exam   Triage Vital Signs: ED Triage Vitals  Encounter Vitals Group     BP 03/25/24 2129 (!) 142/85     Girls Systolic BP Percentile --      Girls Diastolic BP Percentile --      Boys Systolic BP Percentile --      Boys Diastolic BP Percentile --      Pulse Rate 03/25/24 2129 88     Resp 03/25/24 2129 17     Temp 03/25/24 2129 97.9 F (36.6 C)     Temp Source 03/25/24 2129 Oral     SpO2 03/25/24 2129 98 %     Weight 03/25/24 2133 124 lb (56.2 kg)     Height 03/25/24 2133 5' 8 (1.727 m)     Head Circumference --      Peak Flow --      Pain Score 03/25/24 2133 0     Pain Loc --      Pain Education --      Exclude from Growth Chart --     Most recent vital signs: Vitals:   03/25/24 2129  BP:  (!) 142/85  Pulse: 88  Resp: 17  Temp: 97.9 F (36.6 C)  SpO2: 98%     General: Alert, oriented x 2, no distress.  CV:  Good peripheral perfusion.  Normal heart sounds. Resp:  Normal effort.  Lungs CTAB. Abd:  Soft and nontender.  No distention.  Other:  EOMI.  PERRLA.  No photophobia.  No facial droop.  Normal speech.  Motor intact in all extremities.  No ataxia.  Dry mucous membranes.   ED Results / Procedures / Treatments   Labs (all labs ordered are listed, but only abnormal results are displayed) Labs Reviewed  COMPREHENSIVE METABOLIC PANEL WITH GFR - Abnormal; Notable for the following components:      Result Value   BUN 27 (*)    Creatinine, Ser 1.20 (*)    GFR, Estimated 43 (*)    All other components within normal limits  TROPONIN T, HIGH SENSITIVITY - Abnormal; Notable for the following components:   Troponin T High Sensitivity 24 (*)    All other components within normal limits  RESP PANEL BY RT-PCR (RSV, FLU A&B, COVID)  [id number]  CBC WITH DIFFERENTIAL/PLATELET  URINALYSIS, ROUTINE W REFLEX MICROSCOPIC     EKG  ED ECG REPORT I, Waylon Cassis, the attending physician, personally viewed and interpreted this ECG.  Date: 03/25/2024 EKG Time: 2246 Rate: 91 Rhythm: Atrial fibrillation QRS Axis: normal Intervals: RBBB ST/T Wave abnormalities: normal Narrative Interpretation: no evidence of acute ischemia    RADIOLOGY  CT head: I independently viewed and interpreted the images; there is no ICH.  Radiology report indicates no acute intracranial abnormality.   PROCEDURES:  Critical Care performed: No  Procedures   MEDICATIONS ORDERED IN ED: Medications  sodium chloride  0.9 % bolus 500 mL (500 mLs Intravenous New Bag/Given 03/25/24 2248)     IMPRESSION / MDM / ASSESSMENT AND PLAN / ED COURSE  I reviewed the triage vital signs and the nursing notes.  89 year old female with PMH as noted above presents with altered mental status since sometime  earlier today.  On exam she is borderline hypertensive.  Other vital signs are normal.  Thorough neurologic exam is nonfocal.  She does appear somewhat dehydrated.  Differential diagnosis includes, but is not limited to, dehydration, electrolyte abnormality, AKI, other metabolic cause, UTI, COVID, or other infection, less likely CNS or cardiac etiology.  We will obtain CT head, respiratory panel, lab workup, give a fluid bolus, and reassess.  Patient's presentation is most consistent with acute presentation with potential threat to life or bodily function.  The patient is on the cardiac monitor to evaluate for evidence of arrhythmia and/or significant heart rate changes.  ----------------------------------------- 11:11 PM on 03/25/2024 -----------------------------------------  CMP shows no acute findings.  The CBC is normal.  Troponin is minimally elevated.  CT head is negative for acute findings.  The remainder of the workup is still pending.  I have signed the patient out to the oncoming ED physician Dr. Dicky.  FINAL CLINICAL IMPRESSION(S) / ED DIAGNOSES   Final diagnoses:  Altered mental status, unspecified altered mental status type     Rx / DC Orders   ED Discharge Orders     None        Note:  This document was prepared using Dragon voice recognition software and may include unintentional dictation errors.    Cassis Waylon, MD 03/25/24 2312

## 2024-03-26 DIAGNOSIS — Z681 Body mass index (BMI) 19 or less, adult: Secondary | ICD-10-CM | POA: Diagnosis not present

## 2024-03-26 DIAGNOSIS — G9341 Metabolic encephalopathy: Secondary | ICD-10-CM | POA: Diagnosis present

## 2024-03-26 DIAGNOSIS — Z9011 Acquired absence of right breast and nipple: Secondary | ICD-10-CM | POA: Diagnosis not present

## 2024-03-26 DIAGNOSIS — Z7901 Long term (current) use of anticoagulants: Secondary | ICD-10-CM | POA: Diagnosis not present

## 2024-03-26 DIAGNOSIS — N184 Chronic kidney disease, stage 4 (severe): Secondary | ICD-10-CM | POA: Diagnosis present

## 2024-03-26 DIAGNOSIS — R4182 Altered mental status, unspecified: Secondary | ICD-10-CM | POA: Diagnosis present

## 2024-03-26 DIAGNOSIS — I5032 Chronic diastolic (congestive) heart failure: Secondary | ICD-10-CM | POA: Diagnosis present

## 2024-03-26 DIAGNOSIS — N3000 Acute cystitis without hematuria: Secondary | ICD-10-CM | POA: Diagnosis not present

## 2024-03-26 DIAGNOSIS — Z66 Do not resuscitate: Secondary | ICD-10-CM | POA: Diagnosis present

## 2024-03-26 DIAGNOSIS — N1832 Chronic kidney disease, stage 3b: Secondary | ICD-10-CM | POA: Diagnosis not present

## 2024-03-26 DIAGNOSIS — Z8249 Family history of ischemic heart disease and other diseases of the circulatory system: Secondary | ICD-10-CM | POA: Diagnosis not present

## 2024-03-26 DIAGNOSIS — E785 Hyperlipidemia, unspecified: Secondary | ICD-10-CM | POA: Diagnosis present

## 2024-03-26 DIAGNOSIS — G309 Alzheimer's disease, unspecified: Secondary | ICD-10-CM | POA: Diagnosis present

## 2024-03-26 DIAGNOSIS — D0511 Intraductal carcinoma in situ of right breast: Secondary | ICD-10-CM | POA: Diagnosis present

## 2024-03-26 DIAGNOSIS — E034 Atrophy of thyroid (acquired): Secondary | ICD-10-CM | POA: Diagnosis present

## 2024-03-26 DIAGNOSIS — N390 Urinary tract infection, site not specified: Secondary | ICD-10-CM

## 2024-03-26 DIAGNOSIS — I10 Essential (primary) hypertension: Secondary | ICD-10-CM | POA: Diagnosis not present

## 2024-03-26 DIAGNOSIS — Z1152 Encounter for screening for COVID-19: Secondary | ICD-10-CM | POA: Diagnosis not present

## 2024-03-26 DIAGNOSIS — I480 Paroxysmal atrial fibrillation: Secondary | ICD-10-CM | POA: Diagnosis present

## 2024-03-26 DIAGNOSIS — Z96653 Presence of artificial knee joint, bilateral: Secondary | ICD-10-CM | POA: Diagnosis present

## 2024-03-26 DIAGNOSIS — Z79890 Hormone replacement therapy (postmenopausal): Secondary | ICD-10-CM | POA: Diagnosis not present

## 2024-03-26 DIAGNOSIS — I130 Hypertensive heart and chronic kidney disease with heart failure and stage 1 through stage 4 chronic kidney disease, or unspecified chronic kidney disease: Secondary | ICD-10-CM | POA: Diagnosis present

## 2024-03-26 DIAGNOSIS — B964 Proteus (mirabilis) (morganii) as the cause of diseases classified elsewhere: Secondary | ICD-10-CM | POA: Diagnosis present

## 2024-03-26 DIAGNOSIS — F0280 Dementia in other diseases classified elsewhere without behavioral disturbance: Secondary | ICD-10-CM | POA: Diagnosis present

## 2024-03-26 DIAGNOSIS — J449 Chronic obstructive pulmonary disease, unspecified: Secondary | ICD-10-CM | POA: Diagnosis present

## 2024-03-26 DIAGNOSIS — F0150 Vascular dementia without behavioral disturbance: Secondary | ICD-10-CM | POA: Diagnosis present

## 2024-03-26 DIAGNOSIS — E43 Unspecified severe protein-calorie malnutrition: Secondary | ICD-10-CM | POA: Diagnosis present

## 2024-03-26 DIAGNOSIS — I739 Peripheral vascular disease, unspecified: Secondary | ICD-10-CM | POA: Diagnosis present

## 2024-03-26 LAB — TROPONIN T, HIGH SENSITIVITY: Troponin T High Sensitivity: 19 ng/L (ref 0–19)

## 2024-03-26 MED ORDER — POLYETHYLENE GLYCOL 3350 17 GM/SCOOP PO POWD: 17.0000 g | Freq: Every day | ORAL | Status: DC | PRN

## 2024-03-26 MED ORDER — ACETAMINOPHEN 650 MG RE SUPP: 650.0000 mg | Freq: Four times a day (QID) | RECTAL | Status: DC | PRN

## 2024-03-26 MED ORDER — HYDROCODONE-ACETAMINOPHEN 5-325 MG PO TABS: 1.0000 | ORAL_TABLET | ORAL | Status: DC | PRN

## 2024-03-26 MED ORDER — HALOPERIDOL LACTATE 5 MG/ML IJ SOLN: 1.0000 mg | Freq: Four times a day (QID) | INTRAMUSCULAR | Status: DC | PRN

## 2024-03-26 MED ORDER — ONDANSETRON HCL 4 MG PO TABS: 4.0000 mg | ORAL_TABLET | Freq: Four times a day (QID) | ORAL | Status: DC | PRN

## 2024-03-26 MED ORDER — SODIUM CHLORIDE 0.9 % IV SOLN
1.0000 g | INTRAVENOUS | Status: DC
  Administered 2024-03-26 – 2024-03-27 (×2): 1 g via INTRAVENOUS
  Filled 2024-03-26 (×2): qty 10

## 2024-03-26 MED ORDER — LAMOTRIGINE 25 MG PO TABS
25.0000 mg | ORAL_TABLET | Freq: Every day | ORAL | Status: DC
  Administered 2024-03-26 – 2024-03-31 (×6): 25 mg via ORAL
  Filled 2024-03-26 (×6): qty 1

## 2024-03-26 MED ORDER — LEVOTHYROXINE SODIUM 100 MCG PO TABS
100.0000 ug | ORAL_TABLET | Freq: Every day | ORAL | Status: DC
  Administered 2024-03-26 – 2024-03-31 (×5): 100 ug via ORAL
  Filled 2024-03-26 (×5): qty 1
  Filled 2024-03-26: qty 2

## 2024-03-26 MED ORDER — ACETAMINOPHEN 325 MG PO TABS: 650.0000 mg | ORAL_TABLET | Freq: Four times a day (QID) | ORAL | Status: DC | PRN

## 2024-03-26 MED ORDER — METOPROLOL SUCCINATE ER 25 MG PO TB24
25.0000 mg | ORAL_TABLET | Freq: Every day | ORAL | Status: DC
  Administered 2024-03-26 – 2024-03-31 (×6): 25 mg via ORAL
  Filled 2024-03-26 (×6): qty 1

## 2024-03-26 MED ORDER — SERTRALINE HCL 50 MG PO TABS
50.0000 mg | ORAL_TABLET | Freq: Every day | ORAL | Status: DC
  Administered 2024-03-26 – 2024-03-31 (×6): 50 mg via ORAL
  Filled 2024-03-26 (×6): qty 1

## 2024-03-26 MED ORDER — ONDANSETRON HCL 4 MG/2ML IJ SOLN: 4.0000 mg | Freq: Four times a day (QID) | INTRAMUSCULAR | Status: DC | PRN

## 2024-03-26 MED ORDER — APIXABAN 2.5 MG PO TABS
2.5000 mg | ORAL_TABLET | Freq: Two times a day (BID) | ORAL | Status: DC
  Administered 2024-03-26 – 2024-03-31 (×11): 2.5 mg via ORAL
  Filled 2024-03-26 (×12): qty 1

## 2024-03-26 NOTE — Assessment & Plan Note (Signed)
 History of mood disorder Suspect underlying dementia probably with delirium related to acute infection Patient presented with agitation and combativeness and confusion reportedly not her baseline Continue BuSpar  and Wellbutrin Haldol as needed Delirium precautions Treat acute infection

## 2024-03-26 NOTE — Assessment & Plan Note (Signed)
 At baseline

## 2024-03-26 NOTE — ED Notes (Signed)
 Helped patient set up her lunch tray.

## 2024-03-26 NOTE — Assessment & Plan Note (Signed)
Continue apixaban and metoprolol 

## 2024-03-26 NOTE — Assessment & Plan Note (Signed)
 Continue levothyroxine 

## 2024-03-26 NOTE — Assessment & Plan Note (Signed)
 No acute issues suspected

## 2024-03-26 NOTE — Progress Notes (Signed)
 Patient alert to person and birthday. Unable to answer several questions pertaining to admission. Patient states her bottom is sore, on assessment there is a stage 2 pressure ulcer on buttocks, midline. Sacral foam placed on patient.

## 2024-03-26 NOTE — H&P (Signed)
 History and Physical    Patient: [name] FMW:[id number] DOB: Sep 30, 1932 DOA: 03/25/2024 DOS: the patient was seen and examined on 03/26/2024 PCP: Cleotilde Oneil FALCON, MD  Patient coming from: Home  Chief Complaint:  Chief Complaint  Patient presents with   Altered Mental Status    HPI: [name] is a 89 y.o. female with medical history significant for paroxysmal A-fib, chronic HFpEF, chronic thrombocytopenia, hypothyroidism, PAD, CKD 3B, mood disorder, right breast cancer being admitted with UTI presenting with acute metabolic encephalopathy.  EpisodeFacility where she resides reports that she is ANO x 4 but over the past several days has been confused, agitated and combative.  No other symptoms reported but history limited. In the ED, vitals within normal limits labs notable for large leukocytes on urinalysis.  Otherwise CBC, CMP unremarkable.  Respiratory viral panel negative.  Troponin 24 EKG shows A-fib with RBBB CT head nonacute. Patient started on ceftriaxone  and given an NS bolus Admission requested     Past Medical History:  Diagnosis Date   (HFpEF) heart failure with preserved ejection fraction (HCC)    a.) TTE 12/2015: EF 60-65%. no rwma, G2DD, triv AI, mild MR, mildly dil LA. Mild-mod TR. PASP ; b.) TTE 03/01/2018: EF 55-60%, no rwma, mild MR, mildly dil LA, nl PASP. c.) TTE 10/26/2020: EF >55%; mild RA and mod LA enlargement. Triv-mild panvalvular regurgitation.   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   Edema    FEET/ANKLES   GERD (gastroesophageal reflux disease)    History of sepsis    History of stress test    a. 08/2010: EF 73%, no ischemia/infarct.   Hyperlipidemia    Hypertension    Hypothyroid    Lumbar disc disease    Mixed Alzheimer's and vascular dementia (HCC)    PAD (peripheral artery disease)    a. 08/2015 s/p PTA of R AT w/ drug-coated balloon angioplasty to R Popliteal; b. 05/2017 ABI: R 1.16, L 0.88-->stable.   PAF (paroxysmal atrial fibrillation) (HCC)    a.) CHA2DS2-VASc = 8 (age x 2, sex, HFpEF, HTN, DVT x2, PVD). b.) rate/rhythm maintained on amiodarone  + metoprolol  succinate; chronically anticoagulated with low dose apixaban  + ASA. c.) s/p DCCV 08/26/2010 and 11/10/2011   PONV (postoperative nausea and vomiting)    nausea vomiting long ago with surgery but not recent ones   RBBB (right bundle branch block)    Past Surgical History:  Procedure Laterality Date   ABDOMINAL EXPLORATION SURGERY     had to go back in after hysterectomy & check on stitch dr had put near my bladder; don't know if they took it out; had to wear  catheter for 1 month   ABDOMINAL HYSTERECTOMY     with BSO   APPENDECTOMY     BACK SURGERY     BALLOON ANGIOPLASTY, ARTERY Right 09/08/2015   superficial femoral   BREAST EXCISIONAL BIOPSY Right 2007   positive   BREAST SURGERY     CARDIOVERSION  11/10/2011   Procedure: CARDIOVERSION;  Surgeon: Redell GORMAN Shallow, MD;  Location: [facility] OR;  Service: Cardiovascular;  Laterality: N/A;   CARDIOVERSION  08/26/2010   CATARACT EXTRACTION W/PHACO Right 07/17/2017   Procedure: CATARACT EXTRACTION PHACO AND INTRAOCULAR LENS PLACEMENT (IOC);  Surgeon: Jaye Fallow, MD;  Location: [facility] ORS;  Service: Ophthalmology;  Laterality: Right;  US  00:44.0 AP%  14.6 CDE 6.44 FLUID PACK LOT # [id number]   CATARACT EXTRACTION W/PHACO Left 08/07/2017   Procedure: CATARACT EXTRACTION PHACO AND INTRAOCULAR LENS PLACEMENT (IOC);  Surgeon: Jaye Fallow, MD;  Location: [facility] ORS;  Service: Ophthalmology;  Laterality: Left;  US  00:44 AP% 14.1 CDE 6.32 Fluid pack lot # [id number]   COLONOSCOPY WITH PROPOFOL  N/A 01/04/2016   Procedure: COLONOSCOPY WITH PROPOFOL ;  Surgeon: Gladis RAYMOND Mariner, MD;  Location: [facility] ENDOSCOPY;  Service: Endoscopy;  Laterality: N/A;   HALLUX VALGUS CORRECTION     HARDWARE REMOVAL Left 03/18/2021   Procedure: HARDWARE REMOVAL;  Surgeon: Leora Lynwood SAUNDERS, MD;  Location: [facility] ORS;  Service: Orthopedics;  Laterality: Left;   HIP CLOSED REDUCTION Left 01/19/2021   Procedure: CLOSED REDUCTION HIP;  Surgeon: Leora Lynwood SAUNDERS, MD;  Location: [facility] ORS;  Service: Orthopedics;  Laterality: Left;   KYPHOPLASTY N/A 06/25/2018   Procedure: KYPHOPLASTY L3;  Surgeon: Kathlynn Sharper, MD;  Location: [facility] ORS;  Service: Orthopedics;  Laterality: N/A;   LUMBAR DISC SURGERY     MASTECTOMY Right 2013   positive   ORIF ELBOW FRACTURE Left 01/19/2021   Procedure: OPEN REDUCTION INTERNAL FIXATION (ORIF) ELBOW/OLECRANON FRACTURE;  Surgeon: Leora Lynwood SAUNDERS, MD;  Location: [facility] ORS;  Service: Orthopedics;  Laterality: Left;   PERCUTANEOUS PINNING Left 01/19/2021   Procedure: PERCUTANEOUS PINNING EXTREMITY;  Surgeon: Leora Lynwood SAUNDERS, MD;  Location: [facility] ORS;  Service: Orthopedics;  Laterality: Left;   PERIPHERAL VASCULAR CATHETERIZATION N/A 09/08/2015   Procedure: Abdominal Aortogram w/Lower Extremity;  Surgeon: Deatrice DELENA Cage, MD;  Location: [facility] INVASIVE CV LAB;  Service: Cardiovascular;  Laterality: N/A;   PERIPHERAL VASCULAR CATHETERIZATION  09/08/2015   Procedure: Peripheral Vascular Balloon Angioplasty;  Surgeon: Deatrice DELENA Cage, MD;  Location: [facility] INVASIVE CV LAB;  Service: Cardiovascular;;   REVISION TOTAL KNEE ARTHROPLASTY Right    rt.ankle ulcer  surgery  2013   TEMPOROMANDIBULAR JOINT ARTHROPLASTY     TMJ ARTHROPLASTY     TONSILLECTOMY     TOTAL KNEE ARTHROPLASTY Right    TOTAL KNEE ARTHROPLASTY Left 02/26/2018   Procedure: TOTAL KNEE ARTHROPLASTY;  Surgeon: Edie Norleen PARAS, MD;  Location: [facility] ORS;  Service: Orthopedics;  Laterality: Left;   Social History:  reports that she has never smoked. She has never used smokeless tobacco. She reports that she does not drink alcohol and does not use drugs.  Allergies  Allergen Reactions   Other Other (See Comments)    NO BP, VENIPUNCTURE, OR ACCESS IN RIGHT ARM - S/P MASTECTOMY ON RIGHT   Amlodipine  Nausea And Vomiting   Apixaban     Ciprofloxacin Nausea Only   Donepezil  Other (See Comments)   Doxycycline  Itching and Other (See Comments)    shaky   Galantamine Other (See Comments)   Hydrocodone  Other (See Comments)  Suicidal   Lipitor [Atorvastatin] Other (See Comments)    Joint pain    Morphine  And Codeine Nausea And Vomiting   Tramadol     Macrobid [Nitrofurantoin Macrocrystal] Rash   Penicillins Rash and Other (See Comments)    Tolerated 1st generation cephalosporin (CEFAZOLIN ) on 08/20/2013 and 01/19/2021 without documented ADRs.  PCN reaction causing immediate rash, facial/tongue/throat swelling, SOB or lightheadedness with hypotension: yes PCN reaction causing severe rash involving mucus membranes or skin necrosis: no PCN reaction that required hospitalization no PCN reaction occurring within the last 10 years: no If all of the above answers are NO, then may proceed with Cephalosporin use.    Sulfa Antibiotics Rash    Family History  Problem Relation Age of Onset   Heart attack Father    Coronary artery disease Sister 21       MI   Breast cancer Maternal Aunt     Prior to Admission medications   Medication Sig Start Date End Date Taking? Authorizing Provider  acetaminophen  (TYLENOL ) 500 MG tablet Take 500-650 mg by mouth 2 (two) times daily.    [provider]  busPIRone  (BUSPAR ) 15 MG tablet Take 15 mg by mouth 2 (two) times daily. 11/28/21   [provider]  calcium  carbonate (TUMS EX) 750 MG chewable tablet Chew 2 tablets by mouth every 6 (six) hours as needed.    [provider]  cetirizine (ZYRTEC) 5 MG tablet Take 5 mg by mouth daily.    [provider]  ELIQUIS  2.5 MG TABS tablet Take 2.5 mg by mouth 2 (two) times daily.    [provider]  HYDROcodone -acetaminophen  (NORCO/VICODIN) 5-325 MG tablet Take 1 tablet by mouth every 6 (six) hours as needed. 10/25/23   Laurita Pillion, MD  lamoTRIgine  (LAMICTAL ) 25 MG tablet Take 1 tablet (25 mg total) by mouth daily. 10/25/23   Laurita Pillion, MD  levothyroxine  (SYNTHROID ) 100 MCG tablet Take 100 mcg by mouth daily before breakfast. Take on an empty stomach with a glass of water at least 30 to 60 minutes before breakfast. 02/13/23 02/13/24  [provider]  metoprolol  succinate (TOPROL -XL) 25 MG 24 hr tablet Take 25 mg by mouth daily.    [provider]  MIRALAX  17 GM/SCOOP powder Take 17 g by mouth daily as needed for mild constipation. 09/20/23   [provider]  Multiple Vitamins-Minerals (CENTROVITE) TABS Take 1 tablet by mouth daily. 03/04/18   [provider]  psyllium (REGULOID) 0.52 g capsule Take 1 capsule by mouth daily.    [provider]  sertraline  (ZOLOFT ) 50 MG tablet Take 50 mg by mouth daily. 06/30/23   [provider]    Physical Exam: Vitals:   03/25/24 2129 03/25/24 2133  BP: (!) 142/85   Pulse: 88   Resp: 17   Temp: 97.9 F (36.6 C)   TempSrc: Oral   SpO2: 98%   Weight:  56.2 kg  Height:  5' 8 (1.727 m)   Physical Exam Vitals and nursing note reviewed.  Constitutional:      General: She is not in acute distress. HENT:     Head: Normocephalic and atraumatic.  Cardiovascular:     Rate and Rhythm: Normal rate and regular rhythm.     Heart sounds: Normal heart sounds.   Pulmonary:     Effort: Pulmonary effort is normal.     Breath sounds: Normal breath sounds.  Abdominal:     Palpations: Abdomen is soft.  Tenderness: There is no abdominal tenderness.  Neurological:     General: No focal deficit present.     Labs on Admission: I have personally reviewed following labs and imaging studies  CBC: Recent Labs  Lab 03/25/24 2150  WBC 5.5  NEUTROABS 3.4  HGB 15.0  HCT 44.6  MCV 96.3  PLT 168   Basic Metabolic Panel: Recent Labs  Lab 03/25/24 2150  NA 141  K 4.0  CL 104  CO2 25  GLUCOSE 87  BUN 27*  CREATININE 1.20*  CALCIUM  10.1   GFR: Estimated Creatinine Clearance: 28.2 mL/min (A) (by C-G formula based on SCr of 1.2 mg/dL (H)). Liver Function Tests: Recent Labs  Lab 03/25/24 2150  AST 24  ALT 10  ALKPHOS 72  BILITOT 0.4  PROT 6.8  ALBUMIN 4.4   No results for input(s): LIPASE, AMYLASE in the last 168 hours. No results for input(s): AMMONIA in the last 168 hours. Coagulation Profile: No results for input(s): INR, PROTIME in the last 168 hours. Cardiac Enzymes: No results for input(s): CKTOTAL, CKMB, CKMBINDEX, TROPONINI in the last 168 hours. BNP (last 3 results) No results for input(s): PROBNP in the last 8760 hours. HbA1C: No results for input(s): HGBA1C in the last 72 hours. CBG: No results for input(s): GLUCAP in the last 168 hours. Lipid Profile: No results for input(s): CHOL, HDL, LDLCALC, TRIG, CHOLHDL, LDLDIRECT in the last 72 hours. Thyroid  Function Tests: No results for input(s): TSH, T4TOTAL, FREET4, T3FREE, THYROIDAB in the last 72 hours. Anemia Panel: No results for input(s): VITAMINB12, FOLATE, FERRITIN, TIBC, IRON , RETICCTPCT in the last 72 hours. Urine analysis:    Component Value Date/Time   COLORURINE YELLOW (A) 03/25/2024 2251   APPEARANCEUR CLOUDY (A) 03/25/2024 2251   APPEARANCEUR Clear 08/29/2013 1027   LABSPEC 1.012 03/25/2024  2251   LABSPEC 1.005 08/29/2013 1027   PHURINE 7.0 03/25/2024 2251   GLUCOSEU NEGATIVE 03/25/2024 2251   GLUCOSEU Negative 08/29/2013 1027   HGBUR NEGATIVE 03/25/2024 2251   BILIRUBINUR NEGATIVE 03/25/2024 2251   BILIRUBINUR Negative 08/29/2013 1027   KETONESUR NEGATIVE 03/25/2024 2251   PROTEINUR NEGATIVE 03/25/2024 2251   NITRITE NEGATIVE 03/25/2024 2251   LEUKOCYTESUR LARGE (A) 03/25/2024 2251   LEUKOCYTESUR Negative 08/29/2013 1027    Radiological Exams on Admission: CT Head Wo Contrast Result Date: 03/25/2024 CLINICAL DATA:  Altered level of consciousness, combative EXAM: CT HEAD WITHOUT CONTRAST TECHNIQUE: Contiguous axial images were obtained from the base of the skull through the vertex without intravenous contrast. RADIATION DOSE REDUCTION: This exam was performed according to the departmental dose-optimization program which includes automated exposure control, adjustment of the mA and/or kV according to patient size and/or use of iterative reconstruction technique. COMPARISON:  10/22/2023 FINDINGS: Brain: Stable chronic small-vessel ischemic changes are seen within the basal ganglia and periventricular white matter. No evidence of acute infarct or hemorrhage. Lateral ventricles and midline structures are stable. No acute extra-axial fluid collections. No mass effect. Vascular: No hyperdense vessel or unexpected calcification. Skull: Normal. Negative for fracture or focal lesion. Sinuses/Orbits: No acute finding. Other: None. IMPRESSION: 1. Stable head CT, no acute intracranial process. Electronically Signed   By: Ozell Daring M.D.   On: 03/25/2024 22:27   Data Reviewed for HPI: Relevant notes from primary care and specialist visits, past discharge summaries as available in EHR, including Care Everywhere. Prior diagnostic testing as pertinent to current admission diagnoses Updated medications and problem lists for reconciliation ED course, including vitals, labs, imaging, treatment  and  response to treatment Triage notes, nursing and pharmacy notes and ED provider's notes Notable results as noted above in HPI      Assessment and Plan: * Acute metabolic encephalopathy History of mood disorder Suspect underlying dementia probably with delirium related to acute infection Patient presented with agitation and combativeness and confusion reportedly not her baseline Continue BuSpar  and Wellbutrin Haldol as needed Delirium precautions Treat acute infection   Urinary tract infection Rocephin  Follow cultures  PAF (paroxysmal atrial fibrillation) (HCC) Continue apixaban  and metoprolol   Hypothyroidism due to acquired atrophy of thyroid  Continue levothyroxine   CKD stage 3b, GFR 30-44 ml/min (HCC) At baseline  Ductal carcinoma in situ (DCIS) of right breast No acute issues suspected  PAD (peripheral artery disease) Continue apixaban    Chronic diastolic congestive heart failure (HCC) Continue metoprolol  Clinically euvolemic  Hypertension Continue antihypertensives     DVT prophylaxis: eliquis   Consults: none  Advance Care Planning:   Code Status: Prior   Family Communication: none  Disposition Plan: Back to previous home environment  Severity of Illness: The appropriate patient status for this patient is OBSERVATION. Observation status is judged to be reasonable and necessary in order to provide the required intensity of service to ensure the patient's safety. The patient's presenting symptoms, physical exam findings, and initial radiographic and laboratory data in the context of their medical condition is felt to place them at decreased risk for further clinical deterioration. Furthermore, it is anticipated that the patient will be medically stable for discharge from the hospital within 2 midnights of admission.   Author: Delayne LULLA Solian, MD 03/26/2024 1:30 AM  For on call review [email or web address].

## 2024-03-26 NOTE — ED Notes (Signed)
 MD at bedside.

## 2024-03-26 NOTE — Assessment & Plan Note (Signed)
 Rocephin   Follow cultures

## 2024-03-26 NOTE — Progress Notes (Addendum)
°  Progress Note   Patient: [name] FMW:[id number] DOB: 11-Jun-1932 DOA: 03/25/2024     0 DOS: the patient was seen and examined on 03/26/2024  Same Day Admission Note:    89 y.o. female with medical history significant for paroxysmal A-fib, chronic HFpEF, chronic thrombocytopenia, hypothyroidism, PAD, CKD 3B, mood disorder, right breast cancer being admitted with UTI presenting with acute metabolic encephalopathy.  Started on IV Ceftriaxone  for UTI treatment.  CT head is unremarkable. She is pleasantly confused. Hemodynamically stable  Author: Deliliah Room, MD 03/26/2024 11:30 AM  For on call review [email or web address].

## 2024-03-26 NOTE — Assessment & Plan Note (Signed)
-   Continue antihypertensives

## 2024-03-26 NOTE — ED Notes (Signed)
 Pt ambulated to restroom with assistance. Called son and gave him an update.

## 2024-03-26 NOTE — Assessment & Plan Note (Signed)
-   Continue apixaban

## 2024-03-26 NOTE — Progress Notes (Signed)
 Attempted to return call to patients daughter. Jodie Gooch [phone]. No answer.

## 2024-03-26 NOTE — ED Notes (Signed)
 Called and updated daughter Jodie

## 2024-03-26 NOTE — Hospital Course (Signed)
 [name]

## 2024-03-26 NOTE — ED Notes (Signed)
 Pt eating breakfast at this time.

## 2024-03-26 NOTE — ED Notes (Signed)
 Pt resting in bed at this time.

## 2024-03-26 NOTE — Assessment & Plan Note (Signed)
 Continue metoprolol  Clinically euvolemic

## 2024-03-27 DIAGNOSIS — E034 Atrophy of thyroid (acquired): Secondary | ICD-10-CM

## 2024-03-27 DIAGNOSIS — N3000 Acute cystitis without hematuria: Secondary | ICD-10-CM

## 2024-03-27 DIAGNOSIS — I5032 Chronic diastolic (congestive) heart failure: Secondary | ICD-10-CM

## 2024-03-27 DIAGNOSIS — I10 Essential (primary) hypertension: Secondary | ICD-10-CM

## 2024-03-27 DIAGNOSIS — N1832 Chronic kidney disease, stage 3b: Secondary | ICD-10-CM

## 2024-03-27 MED ORDER — ADULT MULTIVITAMIN W/MINERALS CH
1.0000 | ORAL_TABLET | Freq: Every day | ORAL | Status: DC
  Administered 2024-03-28 – 2024-03-31 (×4): 1 via ORAL
  Filled 2024-03-27 (×4): qty 1

## 2024-03-27 MED ORDER — BOOST PLUS PO LIQD
237.0000 mL | Freq: Two times a day (BID) | ORAL | Status: DC
  Administered 2024-03-28 – 2024-03-31 (×6): 237 mL via ORAL

## 2024-03-27 NOTE — Evaluation (Signed)
 Physical Therapy Evaluation Patient Details Name: [name] MRN: [id number] DOB: [birth year] Today's Date: 03/27/2024  History of Present Illness  [name] is a 89 y.o. female with medical history significant for paroxysmal A-fib, chronic HFpEF, chronic thrombocytopenia, hypothyroidism, PAD, CKD 3B, mood disorder, right breast cancer being admitted with UTI presenting with acute metabolic encephalopathy.   Clinical Impression  Patient received in bed, sitter present. Patient is pleasantly confused, agreeable to PT assessment. She required min A for bed mobility. Min A for sit to stand transfers and min A for ambulation with RW. Requires cues and assist for walker negotiation and obstacle avoidance. She will continue to benefit from skilled PT to improve independence.         If plan is discharge home, recommend the following: A little help with walking and/or transfers;A little help with bathing/dressing/bathroom;Assist for transportation;Help with stairs or ramp for entrance;Supervision due to cognitive status;Direct supervision/assist for medications management;Direct supervision/assist for financial management   Can travel by private vehicle    yes    Equipment Recommendations None recommended by PT  Recommendations for Other Services       Functional Status Assessment Patient has had a recent decline in their functional status and/or demonstrates limited ability to make significant improvements in function in a reasonable and predictable amount of time     Precautions / Restrictions Precautions Precautions: Fall Recall of Precautions/Restrictions: Impaired Restrictions Weight Bearing Restrictions Per Provider Order: No      Mobility  Bed Mobility Overal bed mobility: Needs Assistance Bed Mobility: Supine to Sit, Sit to Supine     Supine to sit: Min assist Sit to supine: Min assist   General bed mobility comments: min A with LEs to get in and out of bed     Transfers Overall transfer level: Needs assistance Equipment used: Rolling walker (2 wheels) Transfers: Sit to/from Stand Sit to Stand: Min assist                Ambulation/Gait Ambulation/Gait assistance: Editor, Commissioning (Feet): 100 Feet Assistive device: Rolling walker (2 wheels) Gait Pattern/deviations: Step-through pattern, Decreased step length - right, Decreased step length - left, Decreased stride length Gait velocity: decr     General Gait Details: cues for direction and walker negotiation ( obstacle avoidance)  Stairs            Wheelchair Mobility     Tilt Bed    Modified Rankin (Stroke Patients Only)       Balance Overall balance assessment: Modified Independent                                           Pertinent Vitals/Pain Pain Assessment Pain Assessment: PAINAD Breathing: normal Negative Vocalization: none Facial Expression: smiling or inexpressive Body Language: relaxed Consolability: no need to console PAINAD Score: 0    Home Living Family/patient expects to be discharged to:: Assisted living                 Home Equipment: Agricultural Consultant (2 wheels);Rollator (4 wheels);Shower seat Additional Comments: pt from home place of [place]    Prior Function Prior Level of Function : Needs assist             Mobility Comments: Uses elevator to get to dining hall for meals with rollator- from prior admission 5 months ago. Patient is poor historian  no family present. ADLs Comments: Pt endorses use SPC to get into/out of shower, Ind with self care, staff assist with med management. Rollator for mobility. No family present to confirm. Taken from prior admission 5 mo. ago     Extremity/Trunk Assessment   Upper Extremity Assessment Upper Extremity Assessment: Defer to OT evaluation    Lower Extremity Assessment Lower Extremity Assessment: Overall WFL for tasks assessed    Cervical / Trunk  Assessment Cervical / Trunk Assessment: Normal  Communication   Communication Communication: Impaired Factors Affecting Communication: Difficulty expressing self;Hearing impaired    Cognition Arousal: Alert Behavior During Therapy: WFL for tasks assessed/performed   PT - Cognitive impairments: History of cognitive impairments, No family/caregiver present to determine baseline                         Following commands: Impaired Following commands impaired: Follows one step commands with increased time     Cueing Cueing Techniques: Verbal cues, Gestural cues, Visual cues     General Comments      Exercises     Assessment/Plan    PT Assessment Patient needs continued PT services  PT Problem List Decreased strength;Decreased activity tolerance;Decreased balance;Decreased mobility;Decreased cognition;Decreased safety awareness;Decreased skin integrity       PT Treatment Interventions DME instruction;Gait training;Functional mobility training;Therapeutic activities;Therapeutic exercise;Balance training;Patient/family education    PT Goals (Current goals can be found in the Care Plan section)  Acute Rehab PT Goals Patient Stated Goal: unable PT Goal Formulation: Patient unable to participate in goal setting Time For Goal Achievement: 04/10/24    Frequency Min 1X/week     Co-evaluation               AM-PAC PT 6 Clicks Mobility  Outcome Measure Help needed turning from your back to your side while in a flat bed without using bedrails?: A Little Help needed moving from lying on your back to sitting on the side of a flat bed without using bedrails?: A Little Help needed moving to and from a bed to a chair (including a wheelchair)?: A Little Help needed standing up from a chair using your arms (e.g., wheelchair or bedside chair)?: A Little Help needed to walk in hospital room?: A Little Help needed climbing 3-5 steps with a railing? : A Lot 6 Click Score:  17    End of Session Equipment Utilized During Treatment: Gait belt Activity Tolerance: Patient tolerated treatment well Patient left: in bed;with call bell/phone within reach;with nursing/sitter in room Nurse Communication: Mobility status PT Visit Diagnosis: Unsteadiness on feet (R26.81);Other abnormalities of gait and mobility (R26.89)    Time: 1400-1413 PT Time Calculation (min) (ACUTE ONLY): 13 min   Charges:   PT Evaluation $PT Eval Low Complexity: 1 Low   PT General Charges $$ ACUTE PT VISIT: 1 Visit         Lindia Garms, PT, GCS 03/27/2024,2:23 PM w

## 2024-03-27 NOTE — Progress Notes (Signed)
 Initial Nutrition Assessment  DOCUMENTATION CODES:   Severe malnutrition in context of chronic illness  INTERVENTION:   Boost Plus (chocolate) po BID- Each supplement provides 360kcal and 14g protein.    Magic cup TID with meals, each supplement provides 290 kcal and 9 grams of protein  MVI po daily   Liberalize diet   Pt at high refeed risk; recommend monitor potassium, magnesium  and phosphorus labs daily until stable  Daily weights  NUTRITION DIAGNOSIS:   Severe Malnutrition related to chronic illness as evidenced by severe fat depletion, severe muscle depletion, percent weight loss.  GOAL:   Patient will meet greater than or equal to 90% of their needs  MONITOR:   PO intake, Supplement acceptance, Labs, Weight trends, Skin, I & O's  REASON FOR ASSESSMENT:   Malnutrition Screening Tool    ASSESSMENT:   89 y/o female with h/o PAF, HTN, CHF, CKD III, thyroid  disease, GERD, COPD, DDD, depression, DVT, breast cancer and dementia who is admitted with AMS.  Met with pt in room today. Sitter at bedside. Pt is a poor historian. Pt reports that she is feeling ok today. Pt reports good appetite and oral intake. Pt's lunch tray is sitting on her side table and is 90% eaten. Pt reports that she drinks chocolate Boost at home. RD will add supplements and MVI to help pt meet her estimated needs. RD will also liberalize pt's diet. Pt is likely at refeed risk. Pt reports weight loss pta. Per chart, pt is down 25lbs(18%) over the past 9 months; this is significant weight loss.   Medications reviewed and include: eliquis , synthroid , ceftriaxone   Labs reviewed: K 4.0 wnl, BUN 27(H), creat 1.20(H)  NUTRITION - FOCUSED PHYSICAL EXAM:  Flowsheet Row Most Recent Value  Orbital Region Moderate depletion  Upper Arm Region Severe depletion  Thoracic and Lumbar Region Severe depletion  Buccal Region Moderate depletion  Temple Region Severe depletion  Clavicle Bone Region Severe  depletion  Clavicle and Acromion Bone Region Severe depletion  Scapular Bone Region Severe depletion  Dorsal Hand Severe depletion  Patellar Region Severe depletion  Anterior Thigh Region Severe depletion  Posterior Calf Region Severe depletion  Edema (RD Assessment) None  Hair Reviewed  Eyes Reviewed  Mouth Reviewed  Skin Reviewed  Nails Reviewed   Diet Order:   Diet Order             Diet Heart Room service appropriate? Yes; Fluid consistency: Thin  Diet effective now                  EDUCATION NEEDS:   Education needs have been addressed  Skin:  Skin Assessment: Reviewed RN Assessment (ecchymosis)  Last BM:  12/10 -TYPE 3  Height:   Ht Readings from Last 1 Encounters:  03/25/24 5' 8 (1.727 m)    Weight:   Wt Readings from Last 1 Encounters:  03/27/24 51.9 kg    Ideal Body Weight:  63.6 kg  BMI:  Body mass index is 17.4 kg/m.  Estimated Nutritional Needs:   Kcal:  1400-1600kcal/day  Protein:  70-80g/day  Fluid:  1.4-1.6L/day  Augustin Shams MS, RD, LDN If unable to be reached, please send secure chat to RD inpatient available from 8:00a-4:00p daily

## 2024-03-27 NOTE — Progress Notes (Signed)
 Progress Note   Patient: [name] FMW:[id number] DOB: 07-22-1932 DOA: 03/25/2024     1 DOS: the patient was seen and examined on 03/27/2024   Brief hospital course: [name] is a 89 y.o. female with medical history significant for paroxysmal A-fib, chronic HFpEF, chronic thrombocytopenia, hypothyroidism, PAD, CKD 3B, mood disorder, right breast cancer being admitted with UTI presenting with acute metabolic encephalopathy.  Started on IV Ceftriaxone  for UTI treatment.  CT head is unremarkable.  Assessment and Plan: * Acute metabolic encephalopathy History of mood disorder Suspect underlying dementia probably with delirium related to acute infection Patient presented with agitation and combativeness and confusion reportedly not her baseline. Continue BuSpar  and Wellbutrin Haldol as needed. Sitter for safety. Continue delirium precautions Continue treatment for UTI.  Urinary tract infection Cultures grew Morganella. Continue Rocephin  Follow sensitivities.  PAF (paroxysmal atrial fibrillation) (HCC) Continue apixaban  and metoprolol   Hypothyroidism due to acquired atrophy of thyroid  Continue levothyroxine   CKD stage 3b, GFR 30-44 ml/min (HCC) At baseline. Avoid nephrotoxic drugs.  Ductal carcinoma in situ (DCIS) of right breast No acute issues suspected.  PAD (peripheral artery disease) Continue apixaban .  Chronic diastolic congestive heart failure (HCC) Continue metoprolol . Clinically euvolemic.  Hypertension Continue antihypertensives.     Out of bed to chair. Incentive spirometry. Nursing supportive care. Fall, aspiration precautions. Diet:  Diet Orders (From admission, onward)     Start     Ordered   03/26/24 0133  Diet Heart Room service appropriate? Yes; Fluid consistency: Thin  Diet effective now       Question Answer Comment  Room service appropriate? Yes   Fluid consistency: Thin      03/26/24 0132           DVT prophylaxis: apixaban   (ELIQUIS ) tablet 2.5 mg Start: 03/26/24 1000 apixaban  (ELIQUIS ) tablet 2.5 mg  Level of care: Med-Surg   Code Status: Limited: Do not attempt resuscitation (DNR) -DNR-LIMITED -Do Not Intubate/DNI   Subjective: Patient is seen and examined today morning. She is pleasantly confused. Per sitter on and off agitation, tries to get out of bed.   Physical Exam: Vitals:   03/26/24 2004 03/27/24 0511 03/27/24 0726 03/27/24 1510  BP: (!) 159/73 (!) 141/71 (!) 148/73 (!) 155/68  Pulse: 82 74 90 79  Resp: 20 16 16 16   Temp: (!) 97.4 F (36.3 C)  (!) 97.4 F (36.3 C) 97.8 F (36.6 C)  TempSrc: Axillary   Oral  SpO2: 90% 95% 100% 100%  Weight:      Height:        General - Elderly Caucasian thin built female, no apparent distress HEENT - PERRLA, EOMI, atraumatic head, non tender sinuses. Lung - Clear, no rales, rhonchi, wheezes. Heart - S1, S2 heard, no murmurs, rubs, no pedal edema. Abdomen - Soft, non tender, bowel sounds good Neuro - Alert, awake and confused, non focal exam. Skin - Warm and dry.  Data Reviewed:      Latest Ref Rng & Units 03/25/2024    9:50 PM 10/24/2023    4:31 AM 10/23/2023    3:00 AM  CBC  WBC 4.0 - 10.5 K/uL 5.5  6.7  10.5   Hemoglobin 12.0 - 15.0 g/dL 84.9  88.3  86.9   Hematocrit 36.0 - 46.0 % 44.6  35.0  38.5   Platelets 150 - 400 K/uL 168  104  117       Latest Ref Rng & Units 03/25/2024    9:50 PM 10/24/2023  4:31 AM 10/23/2023    3:00 AM  BMP  Glucose 70 - 99 mg/dL 87  898  863   BUN 8 - 23 mg/dL 27  21  21    Creatinine 0.44 - 1.00 mg/dL 8.79  8.97  8.77   Sodium 135 - 145 mmol/L 141  142  141   Potassium 3.5 - 5.1 mmol/L 4.0  4.2  3.7   Chloride 98 - 111 mmol/L 104  104  102   CO2 22 - 32 mmol/L 25  29  28    Calcium  8.9 - 10.3 mg/dL 89.8  9.3  9.5    CT Head Wo Contrast Result Date: 03/25/2024 CLINICAL DATA:  Altered level of consciousness, combative EXAM: CT HEAD WITHOUT CONTRAST TECHNIQUE: Contiguous axial images were obtained from the base of  the skull through the vertex without intravenous contrast. RADIATION DOSE REDUCTION: This exam was performed according to the departmental dose-optimization program which includes automated exposure control, adjustment of the mA and/or kV according to patient size and/or use of iterative reconstruction technique. COMPARISON:  10/22/2023 FINDINGS: Brain: Stable chronic small-vessel ischemic changes are seen within the basal ganglia and periventricular white matter. No evidence of acute infarct or hemorrhage. Lateral ventricles and midline structures are stable. No acute extra-axial fluid collections. No mass effect. Vascular: No hyperdense vessel or unexpected calcification. Skull: Normal. Negative for fracture or focal lesion. Sinuses/Orbits: No acute finding. Other: None. IMPRESSION: 1. Stable head CT, no acute intracranial process. Electronically Signed   By: Ozell Daring M.D.   On: 03/25/2024 22:27    Family Communication: no family at bedside.  Disposition: Status is: Inpatient Remains inpatient appropriate because: IV abx, follow cultures, sitter.  Planned Discharge Destination: group home     Time spent: 44 minutes  Author: Concepcion Riser, MD 03/27/2024 4:07 PM Secure chat 7am to 7pm For on call review [email or web address].

## 2024-03-27 NOTE — Plan of Care (Signed)
  Problem: Clinical Measurements: Goal: Ability to maintain clinical measurements within normal limits will improve Outcome: Progressing Goal: Will remain free from infection Outcome: Progressing Goal: Diagnostic test results will improve Outcome: Progressing Goal: Respiratory complications will improve Outcome: Progressing Goal: Cardiovascular complication will be avoided Outcome: Progressing   Problem: Activity: Goal: Risk for activity intolerance will decrease Outcome: Progressing   Problem: Safety: Goal: Ability to remain free from injury will improve Outcome: Progressing   Problem: Skin Integrity: Goal: Risk for impaired skin integrity will decrease Outcome: Progressing   

## 2024-03-27 NOTE — Evaluation (Signed)
 Occupational Therapy Evaluation Patient Details Name: [name] MRN: [id number] DOB: [birth year] Today's Date: 03/27/2024   History of Present Illness   [name] is a 89 y.o. female with medical history significant for paroxysmal A-fib, chronic HFpEF, chronic thrombocytopenia, hypothyroidism, PAD, CKD 3B, mood disorder, right breast cancer being admitted with UTI presenting with acute metabolic encephalopathy.     Clinical Impressions Patient presenting with decreased Ind in self care,balance, functional mobility, transfer, endurance, and safety awareness. Patient does not verbalize any PLOF when asked. Per chart review, pt lives at the home place of [place] in an ALF. Pt had previously reported use of AD for mobility at prior admission at endorsed Ind with self care but staff able to assist if needed.  Patient ambulates with min guard - min A with use of RW for safety to bathroom and needing min A for clothing management and mod lifting assistance to stand from low commode. Pt stands for hand hygiene at sink and then returns to bed at end of session.Patient will benefit from acute OT to increase overall independence in the areas of ADLs, functional mobility, and safety awareness in order to safely discharge.       Functional Status Assessment   Patient has had a recent decline in their functional status and/or demonstrates limited ability to make significant improvements in function in a reasonable and predictable amount of time     Equipment Recommendations   None recommended by OT     Recommendations for Other Services         Precautions/Restrictions   Precautions Precautions: Fall Recall of Precautions/Restrictions: Impaired     Mobility Bed Mobility Overal bed mobility: Needs Assistance Bed Mobility: Supine to Sit, Sit to Supine     Supine to sit: Min assist Sit to supine: Min assist        Transfers Overall transfer level: Needs  assistance Equipment used: Rolling walker (2 wheels) Transfers: Sit to/from Stand Sit to Stand: Min assist                  Balance Overall balance assessment: Needs assistance Sitting-balance support: Feet supported Sitting balance-[name]: Fair     Standing balance support: During functional activity, Bilateral upper extremity supported Standing balance-[name]: Poor                             ADL either performed or assessed with clinical judgement   ADL Overall ADL's : Needs assistance/impaired     Grooming: Wash/dry hands;Standing;Contact guard assist                   Toilet Transfer: Minimal assistance;Moderate assistance;Ambulation;Rolling walker (2 wheels);Regular Teacher, Adult Education Details (indicate cue type and reason): from low commode height         Functional mobility during ADLs: Minimal assistance;Rolling walker (2 wheels)       Vision Patient Visual Report: No change from baseline              Pertinent Vitals/Pain Pain Assessment Pain Assessment: PAINAD Breathing: normal Negative Vocalization: none Facial Expression: smiling or inexpressive Body Language: relaxed Consolability: no need to console PAINAD Score: 0     Extremity/Trunk Assessment Upper Extremity Assessment Upper Extremity Assessment: Generalized weakness   Lower Extremity Assessment Lower Extremity Assessment: Generalized weakness   Cervical / Trunk Assessment Cervical / Trunk Assessment: Normal   Communication Communication Communication: Impaired Factors Affecting Communication: Difficulty  expressing self;Hearing impaired   Cognition Arousal: Alert Behavior During Therapy: WFL for tasks assessed/performed Cognition: No apparent impairments                               Following commands: Impaired Following commands impaired: Follows one step commands with increased time     Cueing  General Comments   Cueing  Techniques: Verbal cues;Gestural cues;Visual cues      Exercises     Shoulder Instructions      Home Living Family/patient expects to be discharged to:: Assisted living                             Home Equipment: Rolling Walker (2 wheels);Rollator (4 wheels);Shower seat   Additional Comments: pt from home place of [place]      Prior Functioning/Environment Prior Level of Function : Needs assist             Mobility Comments: Uses elevator to get to dining hall for meals with rollator- from prior admission 5 months ago. Patient is poor historian no family present. ADLs Comments: Pt endorses use SPC to get into/out of shower, Ind with self care, staff assist with med management. Rollator for mobility. No family present to confirm. Taken from prior admission 5 mo. ago    OT Problem List: Decreased strength;Impaired balance (sitting and/or standing);Decreased safety awareness;Pain;Decreased activity tolerance;Decreased cognition   OT Treatment/Interventions: Self-care/ADL training;Therapeutic exercise;Patient/family education;Balance training;Energy conservation;Therapeutic activities;Cognitive remediation/compensation      OT Goals(Current goals can be found in the care plan section)   Acute Rehab OT Goals Patient Stated Goal: none stated Time For Goal Achievement: 04/10/24 ADL Goals Pt Will Perform Grooming: standing;with supervision Pt Will Perform Lower Body Dressing: with supervision;sit to/from stand Pt Will Transfer to Toilet: with supervision;ambulating Pt Will Perform Toileting - Clothing Manipulation and hygiene: with supervision;sit to/from stand   OT Frequency:  Min 2X/week    Co-evaluation              AM-PAC OT 6 Clicks Daily Activity     Outcome Measure Help from another person eating meals?: None Help from another person taking care of personal grooming?: A Little Help from another person toileting, which includes using toliet,  bedpan, or urinal?: A Little Help from another person bathing (including washing, rinsing, drying)?: A Lot Help from another person to put on and taking off regular upper body clothing?: A Little Help from another person to put on and taking off regular lower body clothing?: A Lot 6 Click Score: 17   End of Session Equipment Utilized During Treatment: Rolling walker (2 wheels) Nurse Communication: Mobility status  Activity Tolerance: Patient tolerated treatment well Patient left: in bed;with call bell/phone within reach;with bed alarm set  OT Visit Diagnosis: Unsteadiness on feet (R26.81);Repeated falls (R29.6);Muscle weakness (generalized) (M62.81)                Time: 8488-8471 OT Time Calculation (min): 17 min Charges:  OT General Charges $OT Visit: 1 Visit OT Evaluation $OT Eval Moderate Complexity: 1 Mod OT Treatments $Self Care/Home Management : 8-22 mins  Izetta Claude, MS, OTR/L , CBIS ascom [phone]  03/27/2024, 3:45 PM

## 2024-03-27 NOTE — TOC Initial Note (Signed)
 Transition of Care [facility]) - Initial/Assessment Note    Patient Details  Name: [name] MRN: [id number] Date of Birth: 09-13-1932  Transition of Care [facility]) CM/SW Contact:    Corean ONEIDA Haddock, RN Phone Number: 03/27/2024, 4:16 PM  Clinical Narrative:                  Admitted for: Acute metabolic encephalopathy   Admitted from: [facility] PCP: Cleotilde  Therapy recommending [facility] Message left for Cervante at [facility] to determine if patient will require an assessment prior to return Patient currently with sitter   Message left for son Marcey to review disposition Patient active with [facility]     Patient Goals and CMS Choice            Expected Discharge Plan and Services                                              Prior Living Arrangements/Services                       Activities of Daily Living   ADL Screening (condition at time of admission) Independently performs ADLs?: No Does the patient have a NEW difficulty with bathing/dressing/toileting/self-feeding that is expected to last >3 days?: No Does the patient have a NEW difficulty with getting in/out of bed, walking, or climbing stairs that is expected to last >3 days?: No Does the patient have a NEW difficulty with communication that is expected to last >3 days?: No Is the patient deaf or have difficulty hearing?: Yes Does the patient have difficulty seeing, even when wearing glasses/contacts?: No Does the patient have difficulty concentrating, remembering, or making decisions?: Yes  Permission Sought/Granted                  Emotional Assessment              Admission diagnosis:  Altered mental status, unspecified altered mental status type [R41.82] Acute metabolic encephalopathy [G93.41] Patient Active Problem List   Diagnosis Date Noted   Acute metabolic encephalopathy 03/26/2024   Urinary tract infection 03/26/2024   Closed nondisplaced fracture of  acetabulum (HCC) 10/23/2023   Inferior pubic ramus fracture, left, closed, initial encounter (HCC) 10/23/2023   Closed fracture of left distal femur (HCC) 12/20/2021   Slurred speech 12/20/2021   Thrombocytopenia 12/20/2021   Closed left hip fracture (HCC) 01/17/2021   Syncope and collapse 12/20/2020   Acute deep vein thrombosis (DVT) of left peroneal vein (HCC) 03/06/2018   Arterial leg ulcer (HCC) 03/06/2018   Hypertensive heart and kidney disease with chronic diastolic congestive heart failure and stage 3 chronic kidney disease (HCC) 03/06/2018   GERD without esophagitis 03/06/2018   CKD stage 3b, GFR 30-44 ml/min (HCC) 03/06/2018   Anemia 03/06/2018   Hypothyroidism due to acquired atrophy of thyroid  03/06/2018   Dyslipidemia 03/06/2018   Primary osteoarthritis of left knee 03/06/2018   Status post total knee replacement using cement, left 02/26/2018   Orthostatic hypotension 01/14/2016   ARF (acute renal failure) 01/14/2016   Dehydration 01/14/2016   Protein-calorie malnutrition, severe 01/12/2016   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   PAD (peripheral artery disease) 08/10/2014   Chronic diastolic congestive heart failure (HCC) 02/26/2014   Renal insufficiency 09/14/2011   PAF (paroxysmal atrial fibrillation) (HCC)  08/09/2010   Hypertension 08/09/2010   PCP:  Cleotilde Oneil FALCON, MD Pharmacy:   [phone] [place] [place] [facility] [address] [place] [address] Phone: [phone] Fax: [phone]  [facility] #[id number] [place], [place] - [address] AT [facility] OF [address] [place] [address] Phone: [phone] Fax: [phone]     Social Drivers of Health (SDOH) Social History: SDOH Screenings   Food Insecurity: Patient Unable To Answer (03/26/2024)  Housing: Unknown (03/26/2024)  Transportation Needs: Patient Unable To Answer (03/26/2024)  Utilities: Patient Unable To Answer  (03/26/2024)  Financial Resource Strain: Low Risk  (02/12/2023)   Received from [facility]  Social Connections: Patient Unable To Answer (03/26/2024)  Tobacco Use: Low Risk  (11/07/2023)   Received from [facility]   SDOH Interventions:     Readmission Risk Interventions     No data to display

## 2024-03-27 NOTE — Progress Notes (Signed)
 Patient awake and alert to self.Patient pleasantly confused,multiple attempts to get up out bed.Safety measures in place.Patient is a high falls risk.On Call provider notified of order for possible safety sitter at bedside.

## 2024-03-27 NOTE — Plan of Care (Signed)
   Problem: Education: Goal: Knowledge of General Education information will improve Description: Including pain rating scale, medication(s)/side effects and non-pharmacologic comfort measures Outcome: Progressing   Problem: Health Behavior/Discharge Planning: Goal: Ability to manage health-related needs will improve Outcome: Progressing   Problem: Activity: Goal: Risk for activity intolerance will decrease Outcome: Progressing

## 2024-03-28 LAB — URINE CULTURE: Culture: >=100000 — AB

## 2024-03-28 MED ORDER — LEVOFLOXACIN 250 MG PO TABS
250.0000 mg | ORAL_TABLET | Freq: Every day | ORAL | Status: AC
  Administered 2024-03-28 – 2024-03-29 (×2): 250 mg via ORAL
  Filled 2024-03-28 (×2): qty 1

## 2024-03-28 NOTE — Progress Notes (Signed)
 Patient refused morning vital signs. Provider notified.

## 2024-03-28 NOTE — TOC Initial Note (Signed)
 Transition of Care [facility]) - Initial/Assessment Note    Patient Details  Name: [name] MRN: [id number] Date of Birth: 22-Oct-1932  Transition of Care [facility]) CM/SW Contact:    Alfonso Rummer, LCSW Phone Number: 03/28/2024, 3:10 PM  Clinical Narrative:                 Pt is a LTC resident at [facility]. Pt will return to [facility] once she is medically ready. LCSW A Klaryssa Fauth called [facility] to confirm pt is LTC resident. Pt will return via [facility].         Patient Goals and CMS Choice            Expected Discharge Plan and Services                                              Prior Living Arrangements/Services                       Activities of Daily Living   ADL Screening (condition at time of admission) Independently performs ADLs?: No Does the patient have a NEW difficulty with bathing/dressing/toileting/self-feeding that is expected to last >3 days?: No Does the patient have a NEW difficulty with getting in/out of bed, walking, or climbing stairs that is expected to last >3 days?: No Does the patient have a NEW difficulty with communication that is expected to last >3 days?: No Is the patient deaf or have difficulty hearing?: Yes Does the patient have difficulty seeing, even when wearing glasses/contacts?: No Does the patient have difficulty concentrating, remembering, or making decisions?: Yes  Permission Sought/Granted                  Emotional Assessment              Admission diagnosis:  Altered mental status, unspecified altered mental status type [R41.82] Acute metabolic encephalopathy [G93.41] Patient Active Problem List   Diagnosis Date Noted   Acute metabolic encephalopathy 03/26/2024   Urinary tract infection 03/26/2024   Closed nondisplaced fracture of acetabulum (HCC) 10/23/2023   Inferior pubic ramus fracture, left, closed, initial encounter (HCC) 10/23/2023   Closed fracture of left distal femur  (HCC) 12/20/2021   Slurred speech 12/20/2021   Thrombocytopenia 12/20/2021   Closed left hip fracture (HCC) 01/17/2021   Syncope and collapse 12/20/2020   Acute deep vein thrombosis (DVT) of left peroneal vein (HCC) 03/06/2018   Arterial leg ulcer (HCC) 03/06/2018   Hypertensive heart and kidney disease with chronic diastolic congestive heart failure and stage 3 chronic kidney disease (HCC) 03/06/2018   GERD without esophagitis 03/06/2018   CKD stage 3b, GFR 30-44 ml/min (HCC) 03/06/2018   Anemia 03/06/2018   Hypothyroidism due to acquired atrophy of thyroid  03/06/2018   Dyslipidemia 03/06/2018   Primary osteoarthritis of left knee 03/06/2018   Status post total knee replacement using cement, left 02/26/2018   Orthostatic hypotension 01/14/2016   ARF (acute renal failure) 01/14/2016   Dehydration 01/14/2016   Protein-calorie malnutrition, severe 01/12/2016   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   PAD (peripheral artery disease) 08/10/2014   Chronic diastolic congestive heart failure (HCC) 02/26/2014   Renal insufficiency 09/14/2011   PAF (paroxysmal atrial fibrillation) (HCC) 08/09/2010   Hypertension 08/09/2010   PCP:  Cleotilde Oneil FALCON, MD Pharmacy:   [phone]  [place], [place] - [address] [place] [address] Phone: [phone] Fax: [phone]  [facility]ORE #[id number] [place], [place] - [address] AT [facility] OF [address] [place] [address] Phone: [phone] Fax: [phone]     Social Drivers of Health (SDOH) Social History: SDOH Screenings   Food Insecurity: Patient Unable To Answer (03/26/2024)  Housing: Unknown (03/26/2024)  Transportation Needs: Patient Unable To Answer (03/26/2024)  Utilities: Patient Unable To Answer (03/26/2024)  Financial Resource Strain: Low Risk  (02/12/2023)   Received from [facility]  Social Connections: Patient Unable To  Answer (03/26/2024)  Tobacco Use: Low Risk  (11/07/2023)   Received from [facility]   SDOH Interventions:     Readmission Risk Interventions     No data to display

## 2024-03-28 NOTE — Progress Notes (Signed)
 Progress Note   Patient: [name] FMW:[id number] DOB: 21-Aug-1932 DOA: 03/25/2024     2 DOS: the patient was seen and examined on 03/28/2024   Brief hospital course: [name] is a 89 y.o. female with medical history significant for paroxysmal A-fib, chronic HFpEF, chronic thrombocytopenia, hypothyroidism, PAD, CKD 3B, mood disorder, right breast cancer being admitted with UTI presenting with acute metabolic encephalopathy.  Started on IV Ceftriaxone  for UTI treatment.  CT head is unremarkable.  Assessment and Plan:  # Acute metabolic encephalopathy History of mood disorder Suspect underlying dementia probably with delirium related to acute infection Patient presented with agitation and combativeness and confusion reportedly not her baseline. Continue BuSpar  and Wellbutrin Haldol as needed. Sitter for safety. Continue delirium precautions Continue treatment for UTI.  Urinary tract infection Urine culture growing Morganella Morgagni,  S/p ceftriaxone  IV, transition to oral Levaquin  250 mg po daily as per sensitivity report for 2 days.   PAF (paroxysmal atrial fibrillation) (HCC) Continue apixaban  and metoprolol   Hypothyroidism due to acquired atrophy of thyroid  Continue levothyroxine   CKD stage 3b, GFR 30-44 ml/min (HCC) At baseline. Avoid nephrotoxic drugs.  Ductal carcinoma in situ (DCIS) of right breast No acute issues suspected.  PAD (peripheral artery disease) Continue apixaban .  Chronic diastolic congestive heart failure (HCC) Continue metoprolol . Clinically euvolemic.  Hypertension Continue antihypertensives.   Nutrition Documentation    Flowsheet Row ED to Hosp-Admission (Current) from 03/25/2024 in [facility]  Nutrition Problem Severe Malnutrition  Etiology chronic illness  Nutrition Goal Patient will meet greater than or equal to 90% of their needs    Out of bed to chair. Incentive spirometry. Nursing  supportive care. Fall, aspiration precautions. Diet: Diet Orders (From admission, onward)     Start     Ordered   03/27/24 1747  Diet regular Room service appropriate? Yes with Assist; Fluid consistency: Thin  Diet effective now       Question Answer Comment  Room service appropriate? Yes with Assist   Fluid consistency: Thin      03/27/24 1747           DVT prophylaxis: apixaban  (ELIQUIS ) tablet 2.5 mg Start: 03/26/24 1000 apixaban  (ELIQUIS ) tablet 2.5 mg  Level of care: Med-Surg   Code Status: Limited: Do not attempt resuscitation (DNR) -DNR-LIMITED -Do Not Intubate/DNI   Subjective: No significant events overnight. Patient remains confused, AO x 1 Resting comfortably, did not offer any complaints.   Physical Exam: Vitals:   03/27/24 2139 03/28/24 0500 03/28/24 0942 03/28/24 1404  BP: 131/69  (!) 178/92 (!) 165/72  Pulse: 83  89 73  Resp: 18  16 18   Temp:   97.6 F (36.4 C) 98.6 F (37 C)  TempSrc: Oral  Axillary Oral  SpO2: 96%  99% 100%  Weight:  51.9 kg    Height:        General - Elderly Caucasian thin built female, no apparent distress HEENT - PERRLA, EOMI, atraumatic head, non tender sinuses. Lung - Clear, no rales, rhonchi, wheezes. Heart - S1, S2 heard, no murmurs, rubs, no pedal edema. Abdomen - Soft, non tender, bowel sounds good Neuro - AAOX 1, non focal deficits Skin - Warm and dry.  Data Reviewed:      Latest Ref Rng & Units 03/25/2024    9:50 PM 10/24/2023    4:31 AM 10/23/2023    3:00 AM  CBC  WBC 4.0 - 10.5 K/uL 5.5  6.7  10.5  Hemoglobin 12.0 - 15.0 g/dL 84.9  88.3  86.9   Hematocrit 36.0 - 46.0 % 44.6  35.0  38.5   Platelets 150 - 400 K/uL 168  104  117       Latest Ref Rng & Units 03/25/2024    9:50 PM 10/24/2023    4:31 AM 10/23/2023    3:00 AM  BMP  Glucose 70 - 99 mg/dL 87  898  863   BUN 8 - 23 mg/dL 27  21  21    Creatinine 0.44 - 1.00 mg/dL 8.79  8.97  8.77   Sodium 135 - 145 mmol/L 141  142  141   Potassium 3.5 - 5.1 mmol/L  4.0  4.2  3.7   Chloride 98 - 111 mmol/L 104  104  102   CO2 22 - 32 mmol/L 25  29  28    Calcium  8.9 - 10.3 mg/dL 89.8  9.3  9.5    No results found.   Family Communication: no family at bedside.  Disposition: Status is: Inpatient Remains inpatient appropriate because: s/p IV abx and sitter.  Planned Discharge Destination: LTC at [facility] [place] 12/12 DC plan tomorrow a.m. if patient remains stable, no agitation and does not require sitter then patient can be discharged to LTC tomorrow a.m.      Time spent: 40 minutes  Author: Elvan Sor, MD 03/28/2024 3:01 PM Secure chat 7am to 7pm For on call review [email or web address].

## 2024-03-28 NOTE — Care Management Important Message (Signed)
 Important Message  Patient Details  Name: [name] MRN: [id number] Date of Birth: 08/05/1932   Important Message Given:  Yes - Medicare IM     Mardelle Pandolfi W, CMA 03/28/2024, 2:16 PM

## 2024-03-28 NOTE — Plan of Care (Signed)
 ?  Problem: Education: ?Goal: Knowledge of General Education information will improve ?Description: Including pain rating scale, medication(s)/side effects and non-pharmacologic comfort measures ?Outcome: Progressing ?  ?Problem: Health Behavior/Discharge Planning: ?Goal: Ability to manage health-related needs will improve ?Outcome: Progressing ?  ?Problem: Clinical Measurements: ?Goal: Ability to maintain clinical measurements within normal limits will improve ?Outcome: Progressing ?Goal: Will remain free from infection ?Outcome: Progressing ?Goal: Diagnostic test results will improve ?Outcome: Progressing ?Goal: Respiratory complications will improve ?Outcome: Progressing ?Goal: Cardiovascular complication will be avoided ?Outcome: Progressing ?  ?Problem: Elimination: ?Goal: Will not experience complications related to bowel motility ?Outcome: Progressing ?Goal: Will not experience complications related to urinary retention ?Outcome: Progressing ?  ?Problem: Coping: ?Goal: Level of anxiety will decrease ?Outcome: Progressing ?  ?

## 2024-03-29 MED ORDER — HYDRALAZINE HCL 20 MG/ML IJ SOLN: 10.0000 mg | Freq: Four times a day (QID) | INTRAMUSCULAR | Status: DC | PRN

## 2024-03-29 MED ORDER — ISOSORBIDE MONONITRATE ER 30 MG PO TB24
60.0000 mg | ORAL_TABLET | Freq: Every day | ORAL | Status: DC
  Administered 2024-03-29 – 2024-03-31 (×3): 60 mg via ORAL
  Filled 2024-03-29 (×3): qty 2

## 2024-03-29 MED ORDER — HYDRALAZINE HCL 50 MG PO TABS: 50.0000 mg | ORAL_TABLET | Freq: Four times a day (QID) | ORAL | Status: DC | PRN

## 2024-03-29 NOTE — Plan of Care (Signed)
  Problem: Activity: Goal: Risk for activity intolerance will decrease Outcome: Progressing   Problem: Coping: Goal: Level of anxiety will decrease Outcome: Progressing   Problem: Elimination: Goal: Will not experience complications related to bowel motility Outcome: Progressing Goal: Will not experience complications related to urinary retention Outcome: Progressing   Problem: Pain Managment: Goal: General experience of comfort will improve and/or be controlled Outcome: Progressing   Problem: Safety: Goal: Ability to remain free from injury will improve Outcome: Progressing   Problem: Skin Integrity: Goal: Risk for impaired skin integrity will decrease Outcome: Progressing

## 2024-03-29 NOTE — TOC Progression Note (Signed)
 Transition of Care [facility]) - Progression Note    Patient Details  Name: [name] MRN: [id number] Date of Birth: 1932-05-04  Transition of Care [facility]) CM/SW Contact  Victory Jackquline RAMAN, RN Phone Number: 03/29/2024, 12:43 PM  Clinical Narrative:     [phone]: RNCM received a message via secure chat from the MD asking if the patient can return to ALF today.  [phone]: RNCM called [facility] @ [phone] and spoke to [place], introduced myself and my role. Informed her that the patient was medically stable for discharge and would like to knkow if the \\patient  could return to their facility today. Powell said that she had to check with her administration and call me back Awaiting call back. MD made aware.  1236: RNCM received a call from [facility] informing me that the patient could not return to the facility today because someone from the facility has to come out to reassess her prior to readmission and they will come out on Monday. MD and bedside nurse made aware. RNCM will continue to follow for any discharge needs.                       Expected Discharge Plan and Services         Expected Discharge Date: 03/29/24                                     Social Drivers of Health (SDOH) Interventions SDOH Screenings   Food Insecurity: Patient Unable To Answer (03/26/2024)  Housing: Unknown (03/26/2024)  Transportation Needs: Patient Unable To Answer (03/26/2024)  Utilities: Patient Unable To Answer (03/26/2024)  Financial Resource Strain: Low Risk  (02/12/2023)   Received from [facility]  Social Connections: Patient Unable To Answer (03/26/2024)  Tobacco Use: Low Risk  (11/07/2023)   Received from [facility]    Readmission Risk Interventions     No data to display

## 2024-03-29 NOTE — Plan of Care (Signed)
  Problem: Clinical Measurements: Goal: Ability to maintain clinical measurements within normal limits will improve Outcome: Progressing   Problem: Activity: Goal: Risk for activity intolerance will decrease Outcome: Progressing   Problem: Nutrition: Goal: Adequate nutrition will be maintained Outcome: Progressing   Problem: Elimination: Goal: Will not experience complications related to bowel motility Outcome: Progressing   Problem: Safety: Goal: Ability to remain free from injury will improve Outcome: Progressing

## 2024-03-29 NOTE — Progress Notes (Signed)
 Mobility Specialist - Progress Note   03/29/24 1000  Mobility  Activity Ambulated with assistance;Stood at bedside;Dangled on edge of bed;Respositioned in chair  Level of Assistance Standby assist, set-up cues, supervision of patient - no hands on  Assistive Device Front wheel walker  Distance Ambulated (ft) 6 ft  Range of Motion/Exercises All extremities  Activity Response Tolerated well  Mobility visit 1 Mobility  Mobility Specialist Start Time (ACUTE ONLY) 0930  Mobility Specialist Stop Time (ACUTE ONLY) 1010  Mobility Specialist Time Calculation (min) (ACUTE ONLY) 40 min   Pt was supine in bed with the HOB elevated on RA upon entry. Pt agreed to mobility. Pt is able today to get to the EOB independently with bed features. Pt is able to STS with minA. Pt ambulated a few steps and had a BM. After activity pt is repositioned in the recliner with needs in reach. Chair alarm is on upon exit.  Clem Rodes Mobility Specialist 03/29/2024, 10:17 AM

## 2024-03-29 NOTE — Progress Notes (Signed)
 Progress Note   Patient: [name] FMW:[id number] DOB: January 28, 1933 DOA: 03/25/2024     3 DOS: the patient was seen and examined on 03/29/2024   Brief hospital course: Zoha Spranger Bayon is a 89 y.o. female with medical history significant for paroxysmal A-fib, chronic HFpEF, chronic thrombocytopenia, hypothyroidism, PAD, CKD 3B, mood disorder, right breast cancer being admitted with UTI presenting with acute metabolic encephalopathy.  Started on IV Ceftriaxone  for UTI treatment.  CT head is unremarkable.  Assessment and Plan:  # Acute metabolic encephalopathy History of mood disorder Suspect underlying dementia probably with delirium related to acute infection Patient presented with agitation and combativeness and confusion reportedly not her baseline. Continue BuSpar  and Wellbutrin Haldol  as needed. Sitter for safety. Continue delirium precautions Continue treatment for UTI.  Urinary tract infection Urine culture growing Morganella Morgagni,  S/p ceftriaxone  IV, transition to oral Levaquin  250 mg po daily as per sensitivity report for 2 days.   PAF (paroxysmal atrial fibrillation) (HCC) Continue apixaban  and metoprolol   Hypertension Patient is on Toprol -XL 25 mg/day 12/13 started Imdur  60 mg p.o. daily Hydralazine  as needed Monitor BP and titrate medication according   Hypothyroidism due to acquired atrophy of thyroid  Continue levothyroxine   CKD stage 3b, GFR 30-44 ml/min (HCC) At baseline. Avoid nephrotoxic drugs.  Ductal carcinoma in situ (DCIS) of right breast No acute issues suspected.  PAD (peripheral artery disease) Continue apixaban .  Chronic diastolic congestive heart failure (HCC) Continue metoprolol . Clinically euvolemic.    Nutrition Documentation    Flowsheet Row ED to Hosp-Admission (Current) from 03/25/2024 in [facility]  Nutrition Problem Severe Malnutrition  Etiology chronic illness  Nutrition Goal Patient will  meet greater than or equal to 90% of their needs    Out of bed to chair. Incentive spirometry. Nursing supportive care. Fall, aspiration precautions. Diet: Diet Orders (From admission, onward)     Start     Ordered   03/27/24 1747  Diet regular Room service appropriate? Yes with Assist; Fluid consistency: Thin  Diet effective now       Question Answer Comment  Room service appropriate? Yes with Assist   Fluid consistency: Thin      03/27/24 1747           DVT prophylaxis: apixaban  (ELIQUIS ) tablet 2.5 mg Start: 03/26/24 1000 apixaban  (ELIQUIS ) tablet 2.5 mg  Level of care: Med-Surg   Code Status: Limited: Do not attempt resuscitation (DNR) -DNR-LIMITED -Do Not Intubate/DNI   Subjective: No significant events overnight.  Patient is AO x 1, sitting comfortably on the recliner.  Stated that she is not feeling good but unable to offer any complaints.  Seems very comfortable, no acute distress noticed.    Physical Exam: Vitals:   03/28/24 2011 03/29/24 0352 03/29/24 0611 03/29/24 0815  BP: 118/64 (!) 178/89  (!) 188/66  Pulse: 92 86  76  Resp: 16 16  18   Temp: 97.7 F (36.5 C) (!) 97 F (36.1 C)  98.2 F (36.8 C)  TempSrc:      SpO2: 100% 98%  99%  Weight:   51 kg   Height:        General - Elderly Caucasian thin built female, no apparent distress HEENT - PERRLA, EOMI, atraumatic head, non tender sinuses. Lung - Clear, no rales, rhonchi, wheezes. Heart - S1, S2 heard, no murmurs, rubs, no pedal edema. Abdomen - Soft, non tender, bowel sounds good Neuro - AAOX 1, non focal deficits Skin - Warm and  dry.  Data Reviewed:      Latest Ref Rng & Units 03/25/2024    9:50 PM 10/24/2023    4:31 AM 10/23/2023    3:00 AM  CBC  WBC 4.0 - 10.5 K/uL 5.5  6.7  10.5   Hemoglobin 12.0 - 15.0 g/dL 84.9  88.3  86.9   Hematocrit 36.0 - 46.0 % 44.6  35.0  38.5   Platelets 150 - 400 K/uL 168  104  117       Latest Ref Rng & Units 03/25/2024    9:50 PM 10/24/2023    4:31 AM  10/23/2023    3:00 AM  BMP  Glucose 70 - 99 mg/dL 87  898  863   BUN 8 - 23 mg/dL 27  21  21    Creatinine 0.44 - 1.00 mg/dL 8.79  8.97  8.77   Sodium 135 - 145 mmol/L 141  142  141   Potassium 3.5 - 5.1 mmol/L 4.0  4.2  3.7   Chloride 98 - 111 mmol/L 104  104  102   CO2 22 - 32 mmol/L 25  29  28    Calcium  8.9 - 10.3 mg/dL 89.8  9.3  9.5    No results found.   Family Communication: no family at bedside.  Disposition: Status is: Inpatient Remains inpatient appropriate because: s/p IV abx and sitter.  Planned Discharge Destination: LTC at [facility] 12/13 medically stable to discharge As per [facility] patient will be evaluated by long-term care facility on Monday and then patient can be transferred once they accept her.        Time spent: 40 minutes  Author: Elvan Sor, MD 03/29/2024 3:30 PM Secure chat 7am to 7pm For on call review [email or web address].

## 2024-03-30 NOTE — Plan of Care (Signed)

## 2024-03-30 NOTE — Progress Notes (Signed)
 PROGRESS NOTE    [name]  FMW:[id number] DOB: 03-15-1933 DOA: 03/25/2024 PCP: Cleotilde Oneil FALCON, MD    Brief Narrative:   [name] is a 89 y.o. female with medical history significant for paroxysmal A-fib, chronic HFpEF, chronic thrombocytopenia, hypothyroidism, PAD, CKD 3B, mood disorder, right breast cancer being admitted with UTI presenting with acute metabolic encephalopathy.  Started on IV Ceftriaxone  for UTI treatment.  CT head is unremarkable.     Assessment & Plan:   Principal Problem:   Acute metabolic encephalopathy Active Problems:   Urinary tract infection   PAF (paroxysmal atrial fibrillation) (HCC)   Hypothyroidism due to acquired atrophy of thyroid    Hypertension   Chronic diastolic congestive heart failure (HCC)   PAD (peripheral artery disease)   Ductal carcinoma in situ (DCIS) of right breast   CKD stage 3b, GFR 30-44 ml/min (HCC)  # Acute metabolic encephalopathy History of mood disorder Suspect underlying dementia probably with delirium related to acute infection Patient presented with agitation and combativeness and confusion reportedly not her baseline. Continue BuSpar  and Wellbutrin Haldol  as needed.  Sitter discontinued Plan: Continue delirium precautions Completed UTI treatment   Urinary tract infection Urine culture growing Morganella Morgagni,  S/p ceftriaxone  IV, transition to oral Levaquin  250 mg po daily as per sensitivity report for 2 days. Now completed     PAF (paroxysmal atrial fibrillation) (HCC) Continue apixaban  and metoprolol    Hypertension Patient is on Toprol -XL 25 mg/day 12/13 started Imdur  60 mg p.o. daily Hydralazine  as needed Monitor BP and titrate medication according     Hypothyroidism due to acquired atrophy of thyroid  Continue levothyroxine    CKD stage 3b, GFR 30-44 ml/min (HCC) At baseline. Avoid nephrotoxic drugs.   Ductal carcinoma in situ (DCIS) of right breast No acute issues suspected.   PAD  (peripheral artery disease) Continue apixaban .   Chronic diastolic congestive heart failure (HCC) Continue metoprolol . Clinically euvolemic.     DVT prophylaxis: Apixaban  Code Status: DNR Family Communication: None Disposition Plan: Status is: Inpatient Remains inpatient appropriate because: Unsafe discharge plan   Level of care: Med-Surg  Consultants:  None  Procedures:  None  Antimicrobials: None   Subjective: 7.  Sitting up in bed.  Eating breakfast.  Pleasant.  No distress.  Not combative.  Objective: Vitals:   03/29/24 1553 03/29/24 1951 03/30/24 0524 03/30/24 0802  BP: (!) 102/56 100/60 112/66 (!) 119/57  Pulse: 90 90 73 68  Resp: 16 16 18 18   Temp: 98 F (36.7 C) 97.8 F (36.6 C) 98 F (36.7 C) (!) 97.4 F (36.3 C)  TempSrc:   Oral   SpO2: 98% 99% 98% 100%  Weight:      Height:        Intake/Output Summary (Last 24 hours) at 03/30/2024 1138 Last data filed at 03/30/2024 0531 Gross per 24 hour  Intake 240 ml  Output --  Net 240 ml   Filed Weights   03/27/24 1742 03/28/24 0500 03/29/24 0611  Weight: 51.9 kg 51.9 kg 51 kg    Examination:  General exam: Appears frail otherwise stable Respiratory system: Clear to auscultation. Respiratory effort normal. Cardiovascular system: S1-S2, RRR, no murmurs, no pedal edema Gastrointestinal system: Thin, soft, NT/ND, normal bowel sounds Central nervous system: Alert and oriented x 1.  No focal deficits Extremities: Symmetric 5 x 5 power. Skin: No rashes, lesions or ulcers Psychiatry: Judgement and insight appear impaired. Mood & affect confused.     Data Reviewed: I have personally reviewed  following labs and imaging studies  CBC: Recent Labs  Lab 03/25/24 2150  WBC 5.5  NEUTROABS 3.4  HGB 15.0  HCT 44.6  MCV 96.3  PLT 168   Basic Metabolic Panel: Recent Labs  Lab 03/25/24 2150  NA 141  K 4.0  CL 104  CO2 25  GLUCOSE 87  BUN 27*  CREATININE 1.20*  CALCIUM  10.1    GFR: Estimated Creatinine Clearance: 25.6 mL/min (A) (by C-G formula based on SCr of 1.2 mg/dL (H)). Liver Function Tests: Recent Labs  Lab 03/25/24 2150  AST 24  ALT 10  ALKPHOS 72  BILITOT 0.4  PROT 6.8  ALBUMIN 4.4   No results for input(s): LIPASE, AMYLASE in the last 168 hours. No results for input(s): AMMONIA in the last 168 hours. Coagulation Profile: No results for input(s): INR, PROTIME in the last 168 hours. Cardiac Enzymes: No results for input(s): CKTOTAL, CKMB, CKMBINDEX, TROPONINI in the last 168 hours. BNP (last 3 results) No results for input(s): PROBNP in the last 8760 hours. HbA1C: No results for input(s): HGBA1C in the last 72 hours. CBG: No results for input(s): GLUCAP in the last 168 hours. Lipid Profile: No results for input(s): CHOL, HDL, LDLCALC, TRIG, CHOLHDL, LDLDIRECT in the last 72 hours. Thyroid  Function Tests: No results for input(s): TSH, T4TOTAL, FREET4, T3FREE, THYROIDAB in the last 72 hours. Anemia Panel: No results for input(s): VITAMINB12, FOLATE, FERRITIN, TIBC, IRON , RETICCTPCT in the last 72 hours. Sepsis Labs: No results for input(s): PROCALCITON, LATICACIDVEN in the last 168 hours.  Recent Results (from the past 240 hours)  Resp panel by RT-PCR (RSV, Flu A&B, Covid) Anterior Nasal Swab     Status: None   Collection Time: 03/25/24 10:51 PM   Specimen: Anterior Nasal Swab  Result Value Ref Range Status   SARS Coronavirus 2 by RT PCR NEGATIVE NEGATIVE Final    Comment: (NOTE) SARS-CoV-2 target nucleic acids are NOT DETECTED.  The SARS-CoV-2 RNA is generally detectable in upper respiratory specimens during the acute phase of infection. The lowest concentration of SARS-CoV-2 viral copies this assay can detect is 138 copies/mL. A negative result does not preclude SARS-Cov-2 infection and should not be used as the sole basis for treatment or other patient management  decisions. A negative result may occur with  improper specimen collection/handling, submission of specimen other than nasopharyngeal swab, presence of viral mutation(s) within the areas targeted by this assay, and inadequate number of viral copies(<138 copies/mL). A negative result must be combined with clinical observations, patient history, and epidemiological information. The expected result is Negative.  Fact Sheet for Patients:  bloggercourse.com  Fact Sheet for Healthcare Providers:  seriousbroker.it  This test is no t yet approved or cleared by the [place]  FDA and  has been authorized for detection and/or diagnosis of SARS-CoV-2 by FDA under an Emergency Use Authorization (EUA). This EUA will remain  in effect (meaning this test can be used) for the duration of the COVID-19 declaration under Section 564(b)(1) of the Act, 21 U.S.C.section 360bbb-3(b)(1), unless the authorization is terminated  or revoked sooner.       Influenza A by PCR NEGATIVE NEGATIVE Final   Influenza B by PCR NEGATIVE NEGATIVE Final    Comment: (NOTE) The Xpert Xpress SARS-CoV-2/FLU/RSV plus assay is intended as an aid in the diagnosis of influenza from Nasopharyngeal swab specimens and should not be used as a sole basis for treatment. Nasal washings and aspirates are unacceptable for Xpert Xpress SARS-CoV-2/FLU/RSV testing.  Fact  Sheet for Patients: bloggercourse.com  Fact Sheet for Healthcare Providers: seriousbroker.it  This test is not yet approved or cleared by the [place]  FDA and has been authorized for detection and/or diagnosis of SARS-CoV-2 by FDA under an Emergency Use Authorization (EUA). This EUA will remain in effect (meaning this test can be used) for the duration of the COVID-19 declaration under Section 564(b)(1) of the Act, 21 U.S.C. section 360bbb-3(b)(1), unless the  authorization is terminated or revoked.     Resp Syncytial Virus by PCR NEGATIVE NEGATIVE Final    Comment: (NOTE) Fact Sheet for Patients: bloggercourse.com  Fact Sheet for Healthcare Providers: seriousbroker.it  This test is not yet approved or cleared by the [place]  FDA and has been authorized for detection and/or diagnosis of SARS-CoV-2 by FDA under an Emergency Use Authorization (EUA). This EUA will remain in effect (meaning this test can be used) for the duration of the COVID-19 declaration under Section 564(b)(1) of the Act, 21 U.S.C. section 360bbb-3(b)(1), unless the authorization is terminated or revoked.  Performed at [facility], [address] [place], [place] [address]   Urine Culture     Status: Abnormal   Collection Time: 03/25/24 10:51 PM   Specimen: Urine, Clean Catch  Result Value Ref Range Status   Specimen Description   Final    URINE, CLEAN CATCH Performed at [facility], [address], [place], [place] [address]    Special Requests   Final    NONE Performed at [facility], [address] [place], [place] [address]    Culture >=100,000 COLONIES/mL [facility] MORGANII (A)  Final   Report Status 03/28/2024 FINAL  Final   Organism ID, Bacteria MORGANELLA MORGANII (A)  Final      Susceptibility   Morganella morganii - MIC*    AMPICILLIN >=32 RESISTANT Resistant     ERTAPENEM <=0.12 SENSITIVE Sensitive     CIPROFLOXACIN <=0.06 SENSITIVE Sensitive     GENTAMICIN <=1 SENSITIVE Sensitive     NITROFURANTOIN 128 RESISTANT Resistant     TRIMETH/SULFA <=20 SENSITIVE Sensitive     AMPICILLIN/SULBACTAM >=32 RESISTANT Resistant     PIP/TAZO Value in next row Sensitive      <=4 SENSITIVEThis is a modified FDA-approved test that has been validated and its performance characteristics determined by the reporting laboratory.  This laboratory is certified under the Clinical  Laboratory Improvement Amendments CLIA as qualified to perform high complexity clinical laboratory testing.    MEROPENEM  Value in next row Sensitive      <=4 SENSITIVEThis is a modified FDA-approved test that has been validated and its performance characteristics determined by the reporting laboratory.  This laboratory is certified under the Clinical Laboratory Improvement Amendments CLIA as qualified to perform high complexity clinical laboratory testing.    * >=100,000 COLONIES/mL [facility] MORGANII         Radiology Studies: No results found.      Scheduled Meds:  apixaban   2.5 mg Oral BID   isosorbide  mononitrate  60 mg Oral Daily   lactose free nutrition  237 mL Oral BID BM   lamoTRIgine   25 mg Oral Daily   levothyroxine   100 mcg Oral Q0600   metoprolol  succinate  25 mg Oral Daily   multivitamin with minerals  1 tablet Oral Daily   sertraline   50 mg Oral Daily   Continuous Infusions:   LOS: 4 days      Calvin KATHEE Robson, MD [facility]   If 7PM-7AM, please contact  night-coverage  03/30/2024, 11:38 AM

## 2024-03-30 NOTE — Plan of Care (Signed)
°  Problem: Clinical Measurements: Goal: Ability to maintain clinical measurements within normal limits will improve Outcome: Progressing   Problem: Elimination: Goal: Will not experience complications related to bowel motility Outcome: Progressing   Problem: Safety: Goal: Ability to remain free from injury will improve Outcome: Progressing   Problem: Nutrition: Goal: Adequate nutrition will be maintained Outcome: Progressing

## 2024-03-31 MED ORDER — TRAMADOL HCL 50 MG PO TABS: 25.0000 mg | ORAL_TABLET | Freq: Two times a day (BID) | ORAL | 0 refills | Status: AC | PRN

## 2024-03-31 MED ORDER — ACETAMINOPHEN 500 MG PO TABS: 500.0000 mg | ORAL_TABLET | Freq: Four times a day (QID) | ORAL | Status: AC | PRN

## 2024-03-31 NOTE — Plan of Care (Signed)
   Problem: Clinical Measurements: Goal: Ability to maintain clinical measurements within normal limits will improve Outcome: Progressing

## 2024-03-31 NOTE — Discharge Summary (Signed)
 Physician Discharge Summary  [name] FMW:[id number] DOB: 06/30/1932 DOA: 03/25/2024  PCP: Cleotilde Oneil FALCON, MD  Admit date: 03/25/2024 Discharge date: 03/31/2024  Admitted From: LTC Disposition:  LTC  Recommendations for Outpatient Follow-up:  Follow up with PCP in 1-2 weeks   Home Health:Yes OT PT  Equipment/Devices:None   Discharge Condition:Stable  CODE STATUS:DNR  Diet recommendation: Reg  Brief/Interim Summary:   [name] is a 89 y.o. female with medical history significant for paroxysmal A-fib, chronic HFpEF, chronic thrombocytopenia, hypothyroidism, PAD, CKD 3B, mood disorder, right breast cancer being admitted with UTI presenting with acute metabolic encephalopathy.  Started on IV Ceftriaxone  for UTI treatment.  CT head is unremarkable.    Discharge Diagnoses:  Principal Problem:   Acute metabolic encephalopathy Active Problems:   Urinary tract infection   PAF (paroxysmal atrial fibrillation) (HCC)   Hypothyroidism due to acquired atrophy of thyroid    Hypertension   Chronic diastolic congestive heart failure (HCC)   PAD (peripheral artery disease)   Ductal carcinoma in situ (DCIS) of right breast   CKD stage 3b, GFR 30-44 ml/min (HCC)  Acute metabolic encephalopathy History of mood disorder Suspect underlying dementia probably with delirium related to acute infection Patient presented with agitation and combativeness and confusion reportedly not her baseline. Continue BuSpar  and Wellbutrin Haldol  as needed.  Sitter discontinued Plan: Stable for return to care facility  Completed UTI treatment Mental status at baseline Can resume previous medications   Urinary tract infection Urine culture growing Morganella Morgagni,  S/p ceftriaxone  IV, transition to oral Levaquin  250 mg po daily as per sensitivity report for 2 days. Now completed No abx on DC     PAF (paroxysmal atrial fibrillation) (HCC) Continue apixaban  and metoprolol    Hypertension Can  resume home antihypertensive regimen without changes at this time FU OP PCP 1-2 weeks     Hypothyroidism due to acquired atrophy of thyroid  Continue levothyroxine    CKD stage 3b, GFR 30-44 ml/min (HCC) At baseline. Avoid nephrotoxic drugs.   Ductal carcinoma in situ (DCIS) of right breast No acute issues suspected.   PAD (peripheral artery disease) Continue apixaban .   Chronic diastolic congestive heart failure (HCC) Continue metoprolol . Clinically euvolemic.   Discharge Instructions  Discharge Instructions     Call MD for:  difficulty breathing, headache or visual disturbances   Complete by: As directed    Call MD for:  extreme fatigue   Complete by: As directed    Call MD for:  persistant dizziness or light-headedness   Complete by: As directed    Call MD for:  persistant nausea and vomiting   Complete by: As directed    Call MD for:  severe uncontrolled pain   Complete by: As directed    Call MD for:  temperature >100.4   Complete by: As directed    Discharge instructions   Complete by: As directed    Follow-up with PCP, patient needs to be seen by an MD in 1 to 2 days. Monitor BP and titrate medications accordingly.  Started Imdur  p.o. daily and hydralazine  as needed   Increase activity slowly   Complete by: As directed    Increase activity slowly   Complete by: As directed       Allergies as of 03/31/2024       Reactions   Other Other (See Comments)   NO BP, VENIPUNCTURE, OR ACCESS IN RIGHT ARM - S/P MASTECTOMY ON RIGHT   Amlodipine  Nausea And Vomiting   Apixaban   Ciprofloxacin Nausea Only   Donepezil  Other (See Comments)   Doxycycline  Itching, Other (See Comments)   shaky   Galantamine Other (See Comments)   Hydrocodone  Other (See Comments)   Suicidal   Lipitor [atorvastatin] Other (See Comments)   Joint pain   Morphine  And Codeine Nausea And Vomiting   Tramadol     Macrobid [nitrofurantoin Macrocrystal] Rash   Penicillins Rash, Other (See  Comments)   Tolerated 1st generation cephalosporin (CEFAZOLIN ) on 08/20/2013 and 01/19/2021 without documented ADRs. PCN reaction causing immediate rash, facial/tongue/throat swelling, SOB or lightheadedness with hypotension: yes PCN reaction causing severe rash involving mucus membranes or skin necrosis: no PCN reaction that required hospitalization no PCN reaction occurring within the last 10 years: no If all of the above answers are NO, then may proceed with Cephalosporin use.   Sulfa Antibiotics Rash        Medication List     STOP taking these medications    HYDROcodone -acetaminophen  5-325 MG tablet Commonly known as: NORCO/VICODIN       TAKE these medications    acetaminophen  500 MG tablet Commonly known as: TYLENOL  Take 1 tablet (500 mg total) by mouth every 6 (six) hours as needed for mild pain (pain score 1-3), fever or headache. What changed:  how much to take when to take this reasons to take this   busPIRone  15 MG tablet Commonly known as: BUSPAR  Take 15 mg by mouth 2 (two) times daily.   calcium  carbonate 750 MG chewable tablet Commonly known as: TUMS EX Chew 2 tablets by mouth every 6 (six) hours as needed.   CENTROVITE Tabs Take 1 tablet by mouth daily.   cetirizine 5 MG tablet Commonly known as: ZYRTEC Take 5 mg by mouth daily.   Eliquis  2.5 MG Tabs tablet Generic drug: apixaban  Take 2.5 mg by mouth 2 (two) times daily.   lamoTRIgine  25 MG tablet Commonly known as: LAMICTAL  Take 1 tablet (25 mg total) by mouth daily.   levothyroxine  100 MCG tablet Commonly known as: SYNTHROID  Take 112 mcg by mouth daily before breakfast. Take on an empty stomach with a glass of water at least 30 to 60 minutes before breakfast.   loperamide 2 MG capsule Commonly known as: IMODIUM Take 2 mg by mouth every 4 (four) hours as needed for diarrhea or loose stools.   metoprolol  succinate 25 MG 24 hr tablet Commonly known as: TOPROL -XL Take 37.5 mg by mouth  daily.   MiraLax  17 GM/SCOOP powder Generic drug: polyethylene glycol powder Take 17 g by mouth daily as needed for mild constipation.   Polyethylene Glycol 3350  Powd 1 Capful by Does not apply route daily as needed.   psyllium 0.52 g capsule Commonly known as: REGULOID Take 1 capsule by mouth daily.   sertraline  50 MG tablet Commonly known as: ZOLOFT  Take 50 mg by mouth daily.   traMADol  50 MG tablet Commonly known as: ULTRAM  Take 0.5 tablets (25 mg total) by mouth every 12 (twelve) hours as needed for moderate pain (pain score 4-6). Facility use only.  Refills per facility provider What changed: additional instructions        Allergies[1]  Consultations: None   Procedures/Studies: CT Head Wo Contrast Result Date: 03/25/2024 CLINICAL DATA:  Altered level of consciousness, combative EXAM: CT HEAD WITHOUT CONTRAST TECHNIQUE: Contiguous axial images were obtained from the base of the skull through the vertex without intravenous contrast. RADIATION DOSE REDUCTION: This exam was performed according to the departmental dose-optimization program which includes automated exposure  control, adjustment of the mA and/or kV according to patient size and/or use of iterative reconstruction technique. COMPARISON:  10/22/2023 FINDINGS: Brain: Stable chronic small-vessel ischemic changes are seen within the basal ganglia and periventricular white matter. No evidence of acute infarct or hemorrhage. Lateral ventricles and midline structures are stable. No acute extra-axial fluid collections. No mass effect. Vascular: No hyperdense vessel or unexpected calcification. Skull: Normal. Negative for fracture or focal lesion. Sinuses/Orbits: No acute finding. Other: None. IMPRESSION: 1. Stable head CT, no acute intracranial process. Electronically Signed   By: Ozell Daring M.D.   On: 03/25/2024 22:27      Subjective: Seen and examined on day of DC Stable, appropriate for return to care  facility  Discharge Exam: Vitals:   03/31/24 0519 03/31/24 0859  BP: 120/66 (!) 143/57  Pulse: 73 60  Resp: 16 18  Temp: 98 F (36.7 C) 97.6 F (36.4 C)  SpO2: 98% 99%   Vitals:   03/30/24 1524 03/30/24 1952 03/31/24 0519 03/31/24 0859  BP: (!) 94/55 (!) 110/51 120/66 (!) 143/57  Pulse: 95 100 73 60  Resp: 18 16 16 18   Temp: 98.3 F (36.8 C) 98.5 F (36.9 C) 98 F (36.7 C) 97.6 F (36.4 C)  TempSrc: Oral Oral    SpO2: 97% 99% 98% 99%  Weight:      Height:        General: Pt is alert, awake, not in acute distress Cardiovascular: RRR, S1/S2 +, no rubs, no gallops Respiratory: CTA bilaterally, no wheezing, no rhonchi Abdominal: Soft, NT, ND, bowel sounds + Extremities: no edema, no cyanosis    The results of significant diagnostics from this hospitalization (including imaging, microbiology, ancillary and laboratory) are listed below for reference.     Microbiology: Recent Results (from the past 240 hours)  Resp panel by RT-PCR (RSV, Flu A&B, Covid) Anterior Nasal Swab     Status: None   Collection Time: 03/25/24 10:51 PM   Specimen: Anterior Nasal Swab  Result Value Ref Range Status   SARS Coronavirus 2 by RT PCR NEGATIVE NEGATIVE Final    Comment: (NOTE) SARS-CoV-2 target nucleic acids are NOT DETECTED.  The SARS-CoV-2 RNA is generally detectable in upper respiratory specimens during the acute phase of infection. The lowest concentration of SARS-CoV-2 viral copies this assay can detect is 138 copies/mL. A negative result does not preclude SARS-Cov-2 infection and should not be used as the sole basis for treatment or other patient management decisions. A negative result may occur with  improper specimen collection/handling, submission of specimen other than nasopharyngeal swab, presence of viral mutation(s) within the areas targeted by this assay, and inadequate number of viral copies(<138 copies/mL). A negative result must be combined with clinical  observations, patient history, and epidemiological information. The expected result is Negative.  Fact Sheet for Patients:  bloggercourse.com  Fact Sheet for Healthcare Providers:  seriousbroker.it  This test is no t yet approved or cleared by the [place]  FDA and  has been authorized for detection and/or diagnosis of SARS-CoV-2 by FDA under an Emergency Use Authorization (EUA). This EUA will remain  in effect (meaning this test can be used) for the duration of the COVID-19 declaration under Section 564(b)(1) of the Act, 21 U.S.C.section 360bbb-3(b)(1), unless the authorization is terminated  or revoked sooner.       Influenza A by PCR NEGATIVE NEGATIVE Final   Influenza B by PCR NEGATIVE NEGATIVE Final    Comment: (NOTE) The Xpert Xpress SARS-CoV-2/FLU/RSV plus assay  is intended as an aid in the diagnosis of influenza from Nasopharyngeal swab specimens and should not be used as a sole basis for treatment. Nasal washings and aspirates are unacceptable for Xpert Xpress SARS-CoV-2/FLU/RSV testing.  Fact Sheet for Patients: bloggercourse.com  Fact Sheet for Healthcare Providers: seriousbroker.it  This test is not yet approved or cleared by the [place]  FDA and has been authorized for detection and/or diagnosis of SARS-CoV-2 by FDA under an Emergency Use Authorization (EUA). This EUA will remain in effect (meaning this test can be used) for the duration of the COVID-19 declaration under Section 564(b)(1) of the Act, 21 U.S.C. section 360bbb-3(b)(1), unless the authorization is terminated or revoked.     Resp Syncytial Virus by PCR NEGATIVE NEGATIVE Final    Comment: (NOTE) Fact Sheet for Patients: bloggercourse.com  Fact Sheet for Healthcare Providers: seriousbroker.it  This test is not yet approved or cleared by  the [place]  FDA and has been authorized for detection and/or diagnosis of SARS-CoV-2 by FDA under an Emergency Use Authorization (EUA). This EUA will remain in effect (meaning this test can be used) for the duration of the COVID-19 declaration under Section 564(b)(1) of the Act, 21 U.S.C. section 360bbb-3(b)(1), unless the authorization is terminated or revoked.  Performed at [facility], [address] [place], [place] [address]   Urine Culture     Status: Abnormal   Collection Time: 03/25/24 10:51 PM   Specimen: Urine, Clean Catch  Result Value Ref Range Status   Specimen Description   Final    URINE, CLEAN CATCH Performed at [facility], [address] [place], [place] [address]    Special Requests   Final    NONE Performed at [facility], [address] [place], [place] [address]    Culture >=100,000 COLONIES/mL [facility] MORGANII (A)  Final   Report Status 03/28/2024 FINAL  Final   Organism ID, Bacteria MORGANELLA MORGANII (A)  Final      Susceptibility   Morganella morganii - MIC*    AMPICILLIN >=32 RESISTANT Resistant     ERTAPENEM <=0.12 SENSITIVE Sensitive     CIPROFLOXACIN <=0.06 SENSITIVE Sensitive     GENTAMICIN <=1 SENSITIVE Sensitive     NITROFURANTOIN 128 RESISTANT Resistant     TRIMETH/SULFA <=20 SENSITIVE Sensitive     AMPICILLIN/SULBACTAM >=32 RESISTANT Resistant     PIP/TAZO Value in next row Sensitive      <=4 SENSITIVEThis is a modified FDA-approved test that has been validated and its performance characteristics determined by the reporting laboratory.  This laboratory is certified under the Clinical Laboratory Improvement Amendments CLIA as qualified to perform high complexity clinical laboratory testing.    MEROPENEM  Value in next row Sensitive      <=4 SENSITIVEThis is a modified FDA-approved test that has been validated and its performance characteristics determined by the reporting laboratory.  This laboratory  is certified under the Clinical Laboratory Improvement Amendments CLIA as qualified to perform high complexity clinical laboratory testing.    * >=100,000 COLONIES/mL MORGANELLA MORGANII     Labs: BNP (last 3 results) No results for input(s): BNP in the last 8760 hours. Basic Metabolic Panel: Recent Labs  Lab 03/25/24 2150  NA 141  K 4.0  CL 104  CO2 25  GLUCOSE 87  BUN 27*  CREATININE 1.20*  CALCIUM  10.1   Liver Function Tests: Recent Labs  Lab 03/25/24 2150  AST 24  ALT 10  ALKPHOS 72  BILITOT 0.4  PROT 6.8  ALBUMIN 4.4   No results for input(s): LIPASE, AMYLASE in the last 168 hours. No results for input(s): AMMONIA in the last 168 hours. CBC: Recent Labs  Lab 03/25/24 2150  WBC 5.5  NEUTROABS 3.4  HGB 15.0  HCT 44.6  MCV 96.3  PLT 168   Cardiac Enzymes: No results for input(s): CKTOTAL, CKMB, CKMBINDEX, TROPONINI in the last 168 hours. BNP: Invalid input(s): POCBNP CBG: No results for input(s): GLUCAP in the last 168 hours. D-Dimer No results for input(s): DDIMER in the last 72 hours. Hgb A1c No results for input(s): HGBA1C in the last 72 hours. Lipid Profile No results for input(s): CHOL, HDL, LDLCALC, TRIG, CHOLHDL, LDLDIRECT in the last 72 hours. Thyroid  function studies No results for input(s): TSH, T4TOTAL, T3FREE, THYROIDAB in the last 72 hours.  Invalid input(s): FREET3 Anemia work up No results for input(s): VITAMINB12, FOLATE, FERRITIN, TIBC, IRON , RETICCTPCT in the last 72 hours. Urinalysis    Component Value Date/Time   COLORURINE YELLOW (A) 03/25/2024 2251   APPEARANCEUR CLOUDY (A) 03/25/2024 2251   APPEARANCEUR Clear 08/29/2013 1027   LABSPEC 1.012 03/25/2024 2251   LABSPEC 1.005 08/29/2013 1027   PHURINE 7.0 03/25/2024 2251   GLUCOSEU NEGATIVE 03/25/2024 2251   GLUCOSEU Negative 08/29/2013 1027   HGBUR NEGATIVE 03/25/2024 2251   BILIRUBINUR NEGATIVE 03/25/2024 2251    BILIRUBINUR Negative 08/29/2013 1027   KETONESUR NEGATIVE 03/25/2024 2251   PROTEINUR NEGATIVE 03/25/2024 2251   NITRITE NEGATIVE 03/25/2024 2251   LEUKOCYTESUR LARGE (A) 03/25/2024 2251   LEUKOCYTESUR Negative 08/29/2013 1027   Sepsis Labs Recent Labs  Lab 03/25/24 2150  WBC 5.5   Microbiology Recent Results (from the past 240 hours)  Resp panel by RT-PCR (RSV, Flu A&B, Covid) Anterior Nasal Swab     Status: None   Collection Time: 03/25/24 10:51 PM   Specimen: Anterior Nasal Swab  Result Value Ref Range Status   SARS Coronavirus 2 by RT PCR NEGATIVE NEGATIVE Final    Comment: (NOTE) SARS-CoV-2 target nucleic acids are NOT DETECTED.  The SARS-CoV-2 RNA is generally detectable in upper respiratory specimens during the acute phase of infection. The lowest concentration of SARS-CoV-2 viral copies this assay can detect is 138 copies/mL. A negative result does not preclude SARS-Cov-2 infection and should not be used as the sole basis for treatment or other patient management decisions. A negative result may occur with  improper specimen collection/handling, submission of specimen other than nasopharyngeal swab, presence of viral mutation(s) within the areas targeted by this assay, and inadequate number of viral copies(<138 copies/mL). A negative result must be combined with clinical observations, patient history, and epidemiological information. The expected result is Negative.  Fact Sheet for Patients:  bloggercourse.com  Fact Sheet for Healthcare Providers:  seriousbroker.it  This test is no t yet approved or cleared by the [place]  FDA and  has been authorized for detection and/or diagnosis of SARS-CoV-2 by FDA under an Emergency Use Authorization (EUA). This EUA will remain  in effect (meaning this test can be used) for the duration of the COVID-19 declaration under Section 564(b)(1) of the Act, 21 U.S.C.section  360bbb-3(b)(1), unless the authorization is terminated  or revoked sooner.       Influenza A by PCR NEGATIVE NEGATIVE Final   Influenza B by PCR NEGATIVE NEGATIVE Final    Comment: (NOTE) The Xpert Xpress SARS-CoV-2/FLU/RSV plus assay is intended as an aid in the diagnosis of influenza from Nasopharyngeal swab specimens and should not be used as  a sole basis for treatment. Nasal washings and aspirates are unacceptable for Xpert Xpress SARS-CoV-2/FLU/RSV testing.  Fact Sheet for Patients: bloggercourse.com  Fact Sheet for Healthcare Providers: seriousbroker.it  This test is not yet approved or cleared by the [place]  FDA and has been authorized for detection and/or diagnosis of SARS-CoV-2 by FDA under an Emergency Use Authorization (EUA). This EUA will remain in effect (meaning this test can be used) for the duration of the COVID-19 declaration under Section 564(b)(1) of the Act, 21 U.S.C. section 360bbb-3(b)(1), unless the authorization is terminated or revoked.     Resp Syncytial Virus by PCR NEGATIVE NEGATIVE Final    Comment: (NOTE) Fact Sheet for Patients: bloggercourse.com  Fact Sheet for Healthcare Providers: seriousbroker.it  This test is not yet approved or cleared by the [place]  FDA and has been authorized for detection and/or diagnosis of SARS-CoV-2 by FDA under an Emergency Use Authorization (EUA). This EUA will remain in effect (meaning this test can be used) for the duration of the COVID-19 declaration under Section 564(b)(1) of the Act, 21 U.S.C. section 360bbb-3(b)(1), unless the authorization is terminated or revoked.  Performed at [facility], [address] [place], [place] [address]   Urine Culture     Status: Abnormal   Collection Time: 03/25/24 10:51 PM   Specimen: Urine, Clean Catch  Result Value Ref Range Status   Specimen  Description   Final    URINE, CLEAN CATCH Performed at [facility], [address], [place], [place] [address]    Special Requests   Final    NONE Performed at [facility], [address] [place], [place] [address]    Culture >=100,000 COLONIES/mL [facility] MORGANII (A)  Final   Report Status 03/28/2024 FINAL  Final   Organism ID, Bacteria MORGANELLA MORGANII (A)  Final      Susceptibility   Morganella morganii - MIC*    AMPICILLIN >=32 RESISTANT Resistant     ERTAPENEM <=0.12 SENSITIVE Sensitive     CIPROFLOXACIN <=0.06 SENSITIVE Sensitive     GENTAMICIN <=1 SENSITIVE Sensitive     NITROFURANTOIN 128 RESISTANT Resistant     TRIMETH/SULFA <=20 SENSITIVE Sensitive     AMPICILLIN/SULBACTAM >=32 RESISTANT Resistant     PIP/TAZO Value in next row Sensitive      <=4 SENSITIVEThis is a modified FDA-approved test that has been validated and its performance characteristics determined by the reporting laboratory.  This laboratory is certified under the Clinical Laboratory Improvement Amendments CLIA as qualified to perform high complexity clinical laboratory testing.    MEROPENEM  Value in next row Sensitive      <=4 SENSITIVEThis is a modified FDA-approved test that has been validated and its performance characteristics determined by the reporting laboratory.  This laboratory is certified under the Clinical Laboratory Improvement Amendments CLIA as qualified to perform high complexity clinical laboratory testing.    * >=100,000 COLONIES/mL MORGANELLA MORGANII     Time coordinating discharge: 40 minutes   SIGNED:   Calvin KATHEE Robson, MD  [facility] 03/31/2024, 9:48 AM Pager   If 7PM-7AM, please contact night-coverage     [1]  Allergies Allergen Reactions   Other Other (See Comments)    NO BP, VENIPUNCTURE, OR ACCESS IN RIGHT ARM - S/P MASTECTOMY ON RIGHT   Amlodipine  Nausea And Vomiting   Apixaban     Ciprofloxacin Nausea Only   Donepezil  Other  (See Comments)   Doxycycline  Itching and Other (See Comments)    shaky  Galantamine Other (See Comments)   Hydrocodone  Other (See Comments)    Suicidal   Lipitor [Atorvastatin] Other (See Comments)    Joint pain    Morphine  And Codeine Nausea And Vomiting   Tramadol     Macrobid [Nitrofurantoin Macrocrystal] Rash   Penicillins Rash and Other (See Comments)    Tolerated 1st generation cephalosporin (CEFAZOLIN ) on 08/20/2013 and 01/19/2021 without documented ADRs.  PCN reaction causing immediate rash, facial/tongue/throat swelling, SOB or lightheadedness with hypotension: yes PCN reaction causing severe rash involving mucus membranes or skin necrosis: no PCN reaction that required hospitalization no PCN reaction occurring within the last 10 years: no If all of the above answers are NO, then may proceed with Cephalosporin use.    Sulfa Antibiotics Rash

## 2024-03-31 NOTE — TOC Transition Note (Signed)
 Transition of Care [facility]) - Discharge Note   Patient Details  Name: [name] MRN: [id number] Date of Birth: 12-Feb-1933  [facility] CM/SW Contact:  Corean ONEIDA Haddock, RN Phone Number: 03/31/2024, 9:46 AM   Clinical Narrative:     Spoke with Jill  at [facility] and she confirms patient can return today.  They will provide transport at approximately 1130. She is aware of the recs for [facility] PT and OT.  She states they once patient returns they will set her up with [facility] therapy.   Bedside RN provided number to call report DC summary, Fl2, and therapy orders secure emailed to [email or web address]  Son Marcey notified of dc and in agreement with plan        Patient Goals and CMS Choice            Discharge Placement                       Discharge Plan and Services Additional resources added to the After Visit Summary for                                       Social Drivers of Health (SDOH) Interventions SDOH Screenings   Food Insecurity: Patient Unable To Answer (03/26/2024)  Housing: Unknown (03/26/2024)  Transportation Needs: Patient Unable To Answer (03/26/2024)  Utilities: Patient Unable To Answer (03/26/2024)  Financial Resource Strain: Low Risk  (02/12/2023)   Received from [facility]  Social Connections: Patient Unable To Answer (03/26/2024)  Tobacco Use: Low Risk  (11/07/2023)   Received from [facility]     Readmission Risk Interventions     No data to display

## 2024-03-31 NOTE — Plan of Care (Signed)

## 2024-03-31 NOTE — [place] FL2 (Signed)
 [place]  MEDICAID FL2 LEVEL OF CARE FORM     IDENTIFICATION  Patient Name: [name] Birthdate: November 17, 1932 Sex: female Admission Date (Current Location): 03/25/2024  [facility] [place] [facility] [place] Number:  Chiropodist and Address:         Provider Number: [id number]  Attending Physician Name and Address:  Jhonny Calvin NOVAK, MD  Relative Name and Phone Number:       Current Level of Care: Hospital Recommended Level of Care: Assisted Living Facility Prior Approval Number:    Date Approved/Denied:   PASRR Number:    Discharge Plan: Other (Comment) (ALF)    Current Diagnoses: Patient Active Problem List   Diagnosis Date Noted   Acute metabolic encephalopathy 03/26/2024   Urinary tract infection 03/26/2024   Closed nondisplaced fracture of acetabulum (HCC) 10/23/2023   Inferior pubic ramus fracture, left, closed, initial encounter (HCC) 10/23/2023   Closed fracture of left distal femur (HCC) 12/20/2021   Slurred speech 12/20/2021   Thrombocytopenia 12/20/2021   Closed left hip fracture (HCC) 01/17/2021   Syncope and collapse 12/20/2020   Acute deep vein thrombosis (DVT) of left peroneal vein (HCC) 03/06/2018   Arterial leg ulcer (HCC) 03/06/2018   Hypertensive heart and kidney disease with chronic diastolic congestive heart failure and stage 3 chronic kidney disease (HCC) 03/06/2018   GERD without esophagitis 03/06/2018   CKD stage 3b, GFR 30-44 ml/min (HCC) 03/06/2018   Anemia 03/06/2018   Hypothyroidism due to acquired atrophy of thyroid  03/06/2018   Dyslipidemia 03/06/2018   Primary osteoarthritis of left knee 03/06/2018   Status post total knee replacement using cement, left 02/26/2018   Orthostatic hypotension 01/14/2016   ARF (acute renal failure) 01/14/2016   Dehydration 01/14/2016   Protein-calorie malnutrition, severe 01/12/2016   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   PAD (peripheral artery disease) 08/10/2014   Chronic  diastolic congestive heart failure (HCC) 02/26/2014   Renal insufficiency 09/14/2011   PAF (paroxysmal atrial fibrillation) (HCC) 08/09/2010   Hypertension 08/09/2010    Orientation RESPIRATION BLADDER Height & Weight     Self  Normal Incontinent Weight: 51 kg Height:  5' 8 (172.7 cm)  BEHAVIORAL SYMPTOMS/MOOD NEUROLOGICAL BOWEL NUTRITION STATUS      Incontinent Diet (regular)  AMBULATORY STATUS COMMUNICATION OF NEEDS Skin   Limited Assist Verbally Bruising                       Personal Care Assistance Level of Assistance              Functional Limitations Info             SPECIAL CARE FACTORS FREQUENCY  PT (By licensed PT), OT (By licensed OT)                    Contractures Contractures Info: Not present    Additional Factors Info  Code Status, Allergies Code Status Info: DNR Allergies Info: Other, Amlodipine , Apixaban , Ciprofloxacin, Donepezil , Doxycycline , Galantamine, Hydrocodone , Lipitor (Atorvastatin), Morphine  And Codeine, Tramadol , Macrobid (Nitrofurantoin Macrocrystal), Penicillins, Sulfa Antibiotics           Medication List       STOP taking these medications     HYDROcodone -acetaminophen  5-325 MG tablet Commonly known as: NORCO/VICODIN           TAKE these medications     acetaminophen  500 MG tablet Commonly known as: TYLENOL  Take 1 tablet (500 mg total) by mouth every 6 (six)  hours as needed for mild pain (pain score 1-3), fever or headache. What changed:  how much to take when to take this reasons to take this    busPIRone  15 MG tablet Commonly known as: BUSPAR  Take 15 mg by mouth 2 (two) times daily.    calcium  carbonate 750 MG chewable tablet Commonly known as: TUMS EX Chew 2 tablets by mouth every 6 (six) hours as needed.    CENTROVITE Tabs Take 1 tablet by mouth daily.    cetirizine 5 MG tablet Commonly known as: ZYRTEC Take 5 mg by mouth daily.    Eliquis  2.5 MG Tabs tablet Generic drug:  apixaban  Take 2.5 mg by mouth 2 (two) times daily.    lamoTRIgine  25 MG tablet Commonly known as: LAMICTAL  Take 1 tablet (25 mg total) by mouth daily.    levothyroxine  100 MCG tablet Commonly known as: SYNTHROID  Take 112 mcg by mouth daily before breakfast. Take on an empty stomach with a glass of water at least 30 to 60 minutes before breakfast.    loperamide 2 MG capsule Commonly known as: IMODIUM Take 2 mg by mouth every 4 (four) hours as needed for diarrhea or loose stools.    metoprolol  succinate 25 MG 24 hr tablet Commonly known as: TOPROL -XL Take 37.5 mg by mouth daily.    MiraLax  17 GM/SCOOP powder Generic drug: polyethylene glycol powder Take 17 g by mouth daily as needed for mild constipation.    Polyethylene Glycol 3350  Powd 1 Capful by Does not apply route daily as needed.    psyllium 0.52 g capsule Commonly known as: REGULOID Take 1 capsule by mouth daily.    sertraline  50 MG tablet Commonly known as: ZOLOFT  Take 50 mg by mouth daily.    traMADol  50 MG tablet Commonly known as: ULTRAM  Take 0.5 tablets (25 mg total) by mouth every 12 (twelve) hours as needed for moderate pain (pain score 4-6). Facility use only.  Refills per facility provider What changed: additional instructions   Relevant Imaging Results:  Relevant Lab Results:   Additional Information [id number]  Corean ONEIDA Haddock, RN

## 2024-03-31 NOTE — Progress Notes (Signed)
 Report given to [facility]. Awaiting facility pickup.

## 2024-04-02 ENCOUNTER — Emergency Department: Admission: EM | Admit: 2024-04-02 | Discharge: 2024-04-02 | Disposition: A | Discharge status: Home / Self Care

## 2024-04-02 ENCOUNTER — Other Ambulatory Visit: Payer: Self-pay

## 2024-04-02 ENCOUNTER — Emergency Department

## 2024-04-02 DIAGNOSIS — R410 Disorientation, unspecified: Secondary | ICD-10-CM | POA: Diagnosis not present

## 2024-04-02 DIAGNOSIS — R5383 Other fatigue: Secondary | ICD-10-CM | POA: Diagnosis present

## 2024-04-02 LAB — CBC
HCT: 38.9 % (ref 36.0–46.0)
Hemoglobin: 12.7 g/dL (ref 12.0–15.0)
MCH: 32.1 pg (ref 26.0–34.0)
MCHC: 32.6 g/dL (ref 30.0–36.0)
MCV: 98.2 fL (ref 80.0–100.0)
Platelets: 171 K/uL (ref 150–400)
RBC: 3.96 MIL/uL (ref 3.87–5.11)
RDW: 14.6 % (ref 11.5–15.5)
WBC: 6.4 K/uL (ref 4.0–10.5)
nRBC: 0 % (ref 0.0–0.2)

## 2024-04-02 LAB — URINALYSIS, ROUTINE W REFLEX MICROSCOPIC
Bacteria, UA: NONE SEEN
Bilirubin Urine: NEGATIVE
Glucose, UA: NEGATIVE mg/dL
Ketones, ur: NEGATIVE mg/dL
Nitrite: NEGATIVE
Protein, ur: NEGATIVE mg/dL
Specific Gravity, Urine: 1.014 (ref 1.005–1.030)
pH: 6 (ref 5.0–8.0)

## 2024-04-02 LAB — COMPREHENSIVE METABOLIC PANEL WITH GFR
ALT: 10 U/L (ref 0–44)
AST: 23 U/L (ref 15–41)
Albumin: 4 g/dL (ref 3.5–5.0)
Alkaline Phosphatase: 72 U/L (ref 38–126)
Anion gap: 13 (ref 5–15)
BUN: 27 mg/dL — ABNORMAL HIGH (ref 8–23)
CO2: 28 mmol/L (ref 22–32)
Calcium: 10 mg/dL (ref 8.9–10.3)
Chloride: 100 mmol/L (ref 98–111)
Creatinine, Ser: 1.23 mg/dL — ABNORMAL HIGH (ref 0.44–1.00)
GFR, Estimated: 42 mL/min — ABNORMAL LOW (ref >60)
Glucose, Bld: 80 mg/dL (ref 70–99)
Potassium: 4.3 mmol/L (ref 3.5–5.1)
Sodium: 140 mmol/L (ref 135–145)
Total Bilirubin: 0.5 mg/dL (ref 0.0–1.2)
Total Protein: 6.6 g/dL (ref 6.5–8.1)

## 2024-04-02 NOTE — ED Notes (Signed)
 Pt wheeled to transport [place] at this time with staff from [facility].

## 2024-04-02 NOTE — ED Notes (Addendum)
 SABRA

## 2024-04-02 NOTE — ED Triage Notes (Addendum)
 Pt arrived from [facility] via [facility]. Staff called d/t lethargy and AMS. EMS reports that pt returned to [facility] Monday after being treated for a UTI. Pt currently oriented to person and place, disoriented to time and situation.

## 2024-04-02 NOTE — ED Notes (Signed)
 Warm blanket wrapped around pts feet per request from pt. Pt is still able to move legs at will.

## 2024-04-02 NOTE — Discharge Instructions (Signed)
 Your CT scan of the head and lab test today were all okay.  Please follow-up with your primary care doctor for continued evaluation of these symptoms.

## 2024-04-02 NOTE — ED Notes (Signed)
 Report given to [place], CHARITY FUNDRAISER at [facility].

## 2024-04-02 NOTE — ED Provider Notes (Signed)
 [facility] Provider Note    Event Date/Time   First MD Initiated Contact with Patient 04/02/24 1216     (approximate)   History   Fatigue   HPI  [name] is a 89 y.o. female who presents today with concern of altered mental status.  Was just sent in from assisted living facility due to concern of altered mental status after recently being discharged from our facility due to concern of altered mental status.  During her stay here with suspected delirium versus possibly secondary to a urinary tract infection.  Completed a course of antibiotics at the time of discharge.  She lives in assisted living, over the last 2 days unclear if there was a significant deterioration, seen by physical therapy today and there was concern of possible increased weakness and change in mentation which initiated contacting EMS and having the patient brought in for further assessment and evaluation. Here patient unable to provide me with much history, she has no acute complaints.    Physical Exam   Triage Vital Signs: ED Triage Vitals  Encounter Vitals Group     BP      Girls Systolic BP Percentile      Girls Diastolic BP Percentile      Boys Systolic BP Percentile      Boys Diastolic BP Percentile      Pulse      Resp      Temp      Temp src      SpO2      Weight      Height      Head Circumference      Peak Flow      Pain Score      Pain Loc      Pain Education      Exclude from Growth Chart     Most recent vital signs: Vitals:   04/02/24 1224  BP: 129/74  Pulse: 84  Resp: 16  Temp: 97.6 F (36.4 C)  SpO2: 96%     General: Awake, no distress.  CV:  Good peripheral perfusion.  Resp:  Normal effort.  Abd:  No distention.  Neuro:  Moving all extremities to command, oriented to self person place, disoriented to time. Other:     ED Results / Procedures / Treatments   Labs (all labs ordered are listed, but only abnormal results are displayed) Labs  Reviewed  CBC  COMPREHENSIVE METABOLIC PANEL WITH GFR  URINALYSIS, ROUTINE W REFLEX MICROSCOPIC     EKG  Irregularly irregular rhythm consistent with atrial fibrillation, rate of about 80, axis of about 70, intervals appear to be within normal limits with the presence of a right bundle branch block, no obvious ischemia that I appreciate on this EKG   RADIOLOGY   PROCEDURES:  Critical Care performed: No  Procedures   MEDICATIONS ORDERED IN ED: Medications - No data to display   IMPRESSION / MDM / ASSESSMENT AND PLAN / ED COURSE  I reviewed the triage vital signs and the nursing notes.                               Patient's presentation is most consistent with acute complicated illness / injury requiring diagnostic workup.  89 year old female she was just discharged from our facility for concern of altered mental status, nursing who had patient initially has the same patient today, informing me that  patient was much more disoriented at that time and appears to be more vocal and appropriate today.  She has no acute neurodeficits on my exam, unclear what her baseline mentation is.  I reviewed her chart from her recent visit, unfortunately no clear source for her altered mental status picture.  Will obtain CT imaging of the head urinalysis labs and reach out to family to discuss disposition and goals of care.   Clinical Course as of 04/02/24 1430  Wed Apr 02, 2024  1429 I spoke with the patient power of attorney, [name].  We agreed that if patient states that her current mental status and urinalysis and CT imaging are reassuring, would likely be reasonable for discharge back to her facility.  Will follow-up remaining labs CT and urine and anticipate likely discharge home at this time. [SK]    Clinical Course User Index [SK] Fernand Rossie HERO, MD     FINAL CLINICAL IMPRESSION(S) / ED DIAGNOSES   Final diagnoses:  Disorientation     Rx / DC Orders   ED Discharge Orders      None        Note:  This document was prepared using Dragon voice recognition software and may include unintentional dictation errors.   Fernand Rossie HERO, MD 04/02/24 [phone]

## 2024-04-03 ENCOUNTER — Other Ambulatory Visit: Payer: Self-pay

## 2024-04-03 ENCOUNTER — Emergency Department

## 2024-04-03 DIAGNOSIS — N184 Chronic kidney disease, stage 4 (severe): Secondary | ICD-10-CM | POA: Diagnosis not present

## 2024-04-03 DIAGNOSIS — Z7901 Long term (current) use of anticoagulants: Secondary | ICD-10-CM | POA: Insufficient documentation

## 2024-04-03 DIAGNOSIS — N390 Urinary tract infection, site not specified: Secondary | ICD-10-CM | POA: Insufficient documentation

## 2024-04-03 DIAGNOSIS — I482 Chronic atrial fibrillation, unspecified: Secondary | ICD-10-CM | POA: Insufficient documentation

## 2024-04-03 DIAGNOSIS — Z79899 Other long term (current) drug therapy: Secondary | ICD-10-CM | POA: Diagnosis not present

## 2024-04-03 DIAGNOSIS — I5089 Other heart failure: Secondary | ICD-10-CM | POA: Insufficient documentation

## 2024-04-03 DIAGNOSIS — M545 Low back pain, unspecified: Secondary | ICD-10-CM | POA: Diagnosis present

## 2024-04-03 DIAGNOSIS — W010XXA Fall on same level from slipping, tripping and stumbling without subsequent striking against object, initial encounter: Secondary | ICD-10-CM | POA: Diagnosis not present

## 2024-04-03 DIAGNOSIS — J449 Chronic obstructive pulmonary disease, unspecified: Secondary | ICD-10-CM | POA: Diagnosis not present

## 2024-04-03 DIAGNOSIS — I130 Hypertensive heart and chronic kidney disease with heart failure and stage 1 through stage 4 chronic kidney disease, or unspecified chronic kidney disease: Secondary | ICD-10-CM | POA: Diagnosis not present

## 2024-04-03 DIAGNOSIS — F0390 Unspecified dementia without behavioral disturbance: Secondary | ICD-10-CM | POA: Insufficient documentation

## 2024-04-03 DIAGNOSIS — E039 Hypothyroidism, unspecified: Secondary | ICD-10-CM | POA: Diagnosis not present

## 2024-04-03 DIAGNOSIS — Z853 Personal history of malignant neoplasm of breast: Secondary | ICD-10-CM | POA: Diagnosis not present

## 2024-04-03 LAB — CBC WITH DIFFERENTIAL/PLATELET
Abs Immature Granulocytes: 0.02 K/uL (ref 0.00–0.07)
Basophils Absolute: 0.1 K/uL (ref 0.0–0.1)
Basophils Relative: 1 %
Eosinophils Absolute: 0.2 K/uL (ref 0.0–0.5)
Eosinophils Relative: 2 %
HCT: 39 % (ref 36.0–46.0)
Hemoglobin: 13 g/dL (ref 12.0–15.0)
Immature Granulocytes: 0 %
Lymphocytes Relative: 27 %
Lymphs Abs: 1.8 K/uL (ref 0.7–4.0)
MCH: 33 pg (ref 26.0–34.0)
MCHC: 33.3 g/dL (ref 30.0–36.0)
MCV: 99 fL (ref 80.0–100.0)
Monocytes Absolute: 1.1 K/uL — ABNORMAL HIGH (ref 0.1–1.0)
Monocytes Relative: 17 %
Neutro Abs: 3.5 K/uL (ref 1.7–7.7)
Neutrophils Relative %: 53 %
Platelets: 189 K/uL (ref 150–400)
RBC: 3.94 MIL/uL (ref 3.87–5.11)
RDW: 14.4 % (ref 11.5–15.5)
WBC: 6.6 K/uL (ref 4.0–10.5)
nRBC: 0 % (ref 0.0–0.2)

## 2024-04-03 LAB — COMPREHENSIVE METABOLIC PANEL WITH GFR
ALT: 12 U/L (ref 0–44)
AST: 25 U/L (ref 15–41)
Albumin: 4 g/dL (ref 3.5–5.0)
Alkaline Phosphatase: 76 U/L (ref 38–126)
Anion gap: 15 (ref 5–15)
BUN: 34 mg/dL — ABNORMAL HIGH (ref 8–23)
CO2: 27 mmol/L (ref 22–32)
Calcium: 9.4 mg/dL (ref 8.9–10.3)
Chloride: 102 mmol/L (ref 98–111)
Creatinine, Ser: 1.3 mg/dL — ABNORMAL HIGH (ref 0.44–1.00)
GFR, Estimated: 39 mL/min — ABNORMAL LOW (ref >60)
Glucose, Bld: 116 mg/dL — ABNORMAL HIGH (ref 70–99)
Potassium: 4 mmol/L (ref 3.5–5.1)
Sodium: 143 mmol/L (ref 135–145)
Total Bilirubin: 0.3 mg/dL (ref 0.0–1.2)
Total Protein: 6.5 g/dL (ref 6.5–8.1)

## 2024-04-03 NOTE — ED Triage Notes (Signed)
 Pt presents from [facility] for a mechanical fall. Pt states she tripped and fell striking head. Unknown LOC. Endorsing neck/back pain. Able to ambulate after with increased assistance. Alert and oriented at baseline, per report.

## 2024-04-04 ENCOUNTER — Emergency Department

## 2024-04-04 ENCOUNTER — Emergency Department
Admission: EM | Admit: 2024-04-04 | Discharge: 2024-04-04 | Disposition: A | Attending: Emergency Medicine | Admitting: Emergency Medicine

## 2024-04-04 DIAGNOSIS — N390 Urinary tract infection, site not specified: Secondary | ICD-10-CM

## 2024-04-04 DIAGNOSIS — W19XXXA Unspecified fall, initial encounter: Secondary | ICD-10-CM

## 2024-04-04 LAB — URINE CULTURE: Culture: 40000 — AB

## 2024-04-04 MED ORDER — CEPHALEXIN 500 MG PO CAPS
500.0000 mg | ORAL_CAPSULE | Freq: Once | ORAL | Status: AC
  Administered 2024-04-04: 500 mg via ORAL
  Filled 2024-04-04: qty 1

## 2024-04-04 MED ORDER — ACETAMINOPHEN 500 MG PO TABS
1000.0000 mg | ORAL_TABLET | Freq: Once | ORAL | Status: AC
  Administered 2024-04-04: 1000 mg via ORAL
  Filled 2024-04-04: qty 2

## 2024-04-04 MED ORDER — CEPHALEXIN 500 MG PO CAPS: 500.0000 mg | ORAL_CAPSULE | Freq: Two times a day (BID) | ORAL | 0 refills | Status: AC

## 2024-04-04 NOTE — Discharge Instructions (Addendum)
 CTs, x-rays today showed no acute abnormality.  Blood work was unchanged.  Urinalysis from 2 days ago is growing bacteria so we are starting you on antibiotics.

## 2024-04-04 NOTE — ED Provider Notes (Signed)
 "  [facility] Provider Note    Event Date/Time   First MD Initiated Contact with Patient 04/04/24 0107     (approximate)   History   Fall   HPI  [name] is a 89 y.o. female with history of dementia, heart failure, CKD, COPD, hypertension, hyperlipidemia, hypothyroidism, A-fib on Eliquis  who presents to the emergency department from her nursing home after an unwitnessed fall.  She told staff that she tripped and fell but to me she cannot tell me why she fell and does not remember falling.  She was complaining of buttock pain when she sat down on the stretcher but then denies it later.  She is able to ambulate here without assistance.   History provided by patient, EMS.    Past Medical History:  Diagnosis Date   (HFpEF) heart failure with preserved ejection fraction (HCC)    a.) TTE 12/2015: EF 60-65%. no rwma, G2DD, triv AI, mild MR, mildly dil LA. Mild-mod TR. PASP ; b.) TTE 03/01/2018: EF 55-60%, no rwma, mild MR, mildly dil LA, nl PASP. c.) TTE 10/26/2020: EF >55%; mild RA and mod LA enlargement. Triv-mild panvalvular regurgitation.   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   Edema    FEET/ANKLES   GERD (gastroesophageal reflux disease)    History of sepsis    History of stress test     a. 08/2010: EF 73%, no ischemia/infarct.   Hyperlipidemia    Hypertension    Hypothyroid    Lumbar disc disease    Mixed Alzheimer's and vascular dementia (HCC)    PAD (peripheral artery disease)    a. 08/2015 s/p PTA of R AT w/ drug-coated balloon angioplasty to R Popliteal; b. 05/2017 ABI: R 1.16, L 0.88-->stable.   PAF (paroxysmal atrial fibrillation) (HCC)    a.) CHA2DS2-VASc = 8 (age x 2, sex, HFpEF, HTN, DVT x2, PVD). b.) rate/rhythm maintained on amiodarone  + metoprolol  succinate; chronically anticoagulated with low dose apixaban  + ASA. c.) s/p DCCV 08/26/2010 and 11/10/2011   PONV (postoperative nausea and vomiting)    nausea vomiting long ago with surgery but not recent ones   RBBB (right bundle branch block)     Past Surgical History:  Procedure Laterality Date   ABDOMINAL EXPLORATION SURGERY     had to go back in after hysterectomy & check on stitch dr had put near my bladder; don't know if they took it out; had to wear catheter for 1 month   ABDOMINAL HYSTERECTOMY     with BSO   APPENDECTOMY     BACK SURGERY     BALLOON ANGIOPLASTY, ARTERY Right 09/08/2015   superficial  femoral   BREAST EXCISIONAL BIOPSY Right 2007   positive   BREAST SURGERY     CARDIOVERSION  11/10/2011   Procedure: CARDIOVERSION;  Surgeon: Redell GORMAN Shallow, MD;  Location: [facility] OR;  Service: Cardiovascular;  Laterality: N/A;   CARDIOVERSION  08/26/2010   CATARACT EXTRACTION W/PHACO Right 07/17/2017   Procedure: CATARACT EXTRACTION PHACO AND INTRAOCULAR LENS PLACEMENT (IOC);  Surgeon: Jaye Fallow, MD;  Location: [facility] ORS;  Service: Ophthalmology;  Laterality: Right;  US  00:44.0 AP% 14.6 CDE 6.44 FLUID PACK LOT # [id number]   CATARACT EXTRACTION W/PHACO Left 08/07/2017   Procedure: CATARACT EXTRACTION PHACO AND INTRAOCULAR LENS PLACEMENT (IOC);  Surgeon: Jaye Fallow, MD;  Location: [facility] ORS;  Service: Ophthalmology;  Laterality: Left;  US  00:44 AP% 14.1 CDE 6.32 Fluid pack lot # [id number]    COLONOSCOPY WITH PROPOFOL  N/A 01/04/2016   Procedure: COLONOSCOPY WITH PROPOFOL ;  Surgeon: Gladis RAYMOND Mariner, MD;  Location: [facility] ENDOSCOPY;  Service: Endoscopy;  Laterality: N/A;   HALLUX VALGUS CORRECTION     HARDWARE REMOVAL Left 03/18/2021   Procedure: HARDWARE REMOVAL;  Surgeon: Leora Lynwood SAUNDERS, MD;  Location: [facility] ORS;  Service: Orthopedics;  Laterality: Left;   HIP CLOSED REDUCTION Left 01/19/2021   Procedure: CLOSED REDUCTION HIP;  Surgeon: Leora Lynwood SAUNDERS, MD;  Location: [facility] ORS;  Service: Orthopedics;  Laterality: Left;   KYPHOPLASTY N/A 06/25/2018   Procedure: KYPHOPLASTY L3;  Surgeon: Kathlynn Sharper, MD;  Location: [facility] ORS;  Service: Orthopedics;  Laterality: N/A;   LUMBAR DISC SURGERY     MASTECTOMY Right 2013   positive   ORIF ELBOW FRACTURE Left 01/19/2021   Procedure: OPEN REDUCTION INTERNAL FIXATION (ORIF) ELBOW/OLECRANON FRACTURE;  Surgeon: Leora Lynwood SAUNDERS, MD;  Location: [facility] ORS;  Service: Orthopedics;  Laterality: Left;   PERCUTANEOUS PINNING Left 01/19/2021   Procedure: PERCUTANEOUS PINNING EXTREMITY;  Surgeon: Leora Lynwood SAUNDERS, MD;  Location: [facility] ORS;  Service: Orthopedics;  Laterality: Left;   PERIPHERAL VASCULAR CATHETERIZATION N/A 09/08/2015   Procedure: Abdominal Aortogram w/Lower Extremity;  Surgeon: Deatrice DELENA Cage, MD;  Location: [facility] INVASIVE CV LAB;  Service: Cardiovascular;  Laterality: N/A;   PERIPHERAL VASCULAR CATHETERIZATION  09/08/2015   Procedure: Peripheral Vascular Balloon Angioplasty;  Surgeon: Deatrice DELENA Cage, MD;  Location: [facility] INVASIVE CV LAB;  Service: Cardiovascular;;   REVISION TOTAL KNEE ARTHROPLASTY Right    rt.ankle ulcer surgery  2013   TEMPOROMANDIBULAR JOINT ARTHROPLASTY     TMJ ARTHROPLASTY     TONSILLECTOMY     TOTAL KNEE ARTHROPLASTY Right    TOTAL KNEE ARTHROPLASTY Left 02/26/2018   Procedure: TOTAL KNEE ARTHROPLASTY;  Surgeon: Edie Norleen PARAS, MD;  Location: [facility] ORS;  Service: Orthopedics;  Laterality: Left;    MEDICATIONS:   Prior to Admission medications  Medication Sig Start Date End Date Taking? Authorizing Provider  acetaminophen  (TYLENOL ) 500 MG tablet Take 1 tablet (500 mg total) by mouth every 6 (six) hours as needed for mild pain (pain score 1-3), fever or headache. 03/31/24   Jhonny Calvin NOVAK, MD  busPIRone  (BUSPAR ) 15 MG tablet Take 15 mg by mouth 2 (two) times daily. 11/28/21   [provider]  calcium  carbonate (TUMS EX) 750 MG chewable tablet Chew 2 tablets by mouth every 6 (six) hours as needed.    [provider]  cetirizine (ZYRTEC) 5 MG tablet Take 5 mg by mouth daily.    [provider]  ELIQUIS  2.5 MG TABS tablet Take 2.5 mg by mouth 2 (two) times daily.  [provider]  lamoTRIgine  (LAMICTAL ) 25 MG tablet Take 1 tablet (25 mg total) by mouth daily. 10/25/23   Laurita Pillion, MD  levothyroxine  (SYNTHROID ) 100 MCG tablet Take 112 mcg by mouth daily before breakfast. Take on an empty stomach with a glass of water at least 30 to 60 minutes before breakfast. 02/13/23 03/26/24  [provider]  loperamide (IMODIUM) 2 MG capsule Take 2 mg by mouth every 4 (four) hours as needed for diarrhea or loose stools.    [provider]  metoprolol  succinate (TOPROL -XL) 25 MG 24 hr tablet Take 37.5 mg by mouth daily.    [provider]  MIRALAX  17 GM/SCOOP powder Take 17 g by mouth daily as needed for mild constipation. 09/20/23   [provider]  Multiple Vitamins-Minerals (CENTROVITE) TABS Take 1 tablet by mouth daily. 03/04/18   [provider]  Polyethylene Glycol 3350  POWD 1 Capful by Does not apply route daily as needed.    [provider]  psyllium (REGULOID) 0.52 g capsule Take 1 capsule by mouth daily.    [provider]  sertraline  (ZOLOFT ) 50 MG tablet Take 50 mg by mouth daily. 06/30/23   [provider]  traMADol  (ULTRAM ) 50 MG tablet Take 0.5 tablets (25 mg total) by mouth every 12 (twelve) hours  as needed for moderate pain (pain score 4-6). Facility use only.  Refills per facility provider 03/31/24   Jhonny Calvin NOVAK, MD    Physical Exam   Triage Vital Signs: ED Triage Vitals  Encounter Vitals Group     BP 04/03/24 2137 120/62     Girls Systolic BP Percentile --      Girls Diastolic BP Percentile --      Boys Systolic BP Percentile --      Boys Diastolic BP Percentile --      Pulse Rate 04/03/24 2137 94     Resp 04/03/24 2137 16     Temp 04/03/24 2137 98.2 F (36.8 C)     Temp Source 04/03/24 2137 Oral     SpO2 --      Weight 04/03/24 2138 106 lb 11.2 oz (48.4 kg)     Height 04/03/24 2138 5' 8 (1.727 m)     Head Circumference --      Peak Flow --      Pain Score --      Pain Loc --      Pain Education --      Exclude from Growth Chart --     Most recent vital signs: Vitals:   04/03/24 2137  BP: 120/62  Pulse: 94  Resp: 16  Temp: 98.2 F (36.8 C)     CONSTITUTIONAL: Alert, elderly, pleasantly demented HEAD: Normocephalic; atraumatic EYES: Conjunctivae clear, PERRL, EOMI ENT: normal nose; no rhinorrhea; moist mucous membranes; pharynx without lesions noted; no dental injury; no septal hematoma, no epistaxis; no facial deformity or bony tenderness NECK: Supple, no midline spinal tenderness, step-off or deformity; trachea midline CARD: RRR; S1 and S2 appreciated; no murmurs, no clicks, no rubs, no gallops RESP: Normal chest excursion without splinting or tachypnea; breath sounds clear and equal bilaterally; no wheezes, no rhonchi, no rales; no hypoxia or respiratory distress CHEST:  chest wall stable, no crepitus or ecchymosis or deformity, nontender to palpation; no flail chest ABD/GI: Non-distended; soft, non-tender, no rebound, no guarding; no ecchymosis or other lesions noted PELVIS:  stable, nontender to palpation BACK:  The back appears normal; no midline spinal tenderness, step-off  or deformity EXT: Normal ROM in all joints; no edema; normal  capillary refill; no cyanosis, no bony tenderness or bony deformity of patient's extremities, no joint effusions, compartments are soft, extremities are warm and well-perfused, no ecchymosis SKIN: Normal color for age and race; warm NEURO: No facial asymmetry, normal speech, moving all extremities equally, ambulates with slow and steady gait  ED Results / Procedures / Treatments   LABS: (all labs ordered are listed, but only abnormal results are displayed) Labs Reviewed  CBC WITH DIFFERENTIAL/PLATELET - Abnormal; Notable for the following components:      Result Value   Monocytes Absolute 1.1 (*)    All other components within normal limits  COMPREHENSIVE METABOLIC PANEL WITH GFR - Abnormal; Notable for the following components:   Glucose, Bld 116 (*)    BUN 34 (*)    Creatinine, Ser 1.30 (*)    GFR, Estimated 39 (*)    All other components within normal limits     EKG:  EKG Interpretation Date/Time:    Ventricular Rate:    PR Interval:    QRS Duration:    QT Interval:    QTC Calculation:   R Axis:      Text Interpretation:            RADIOLOGY: My personal review and interpretation of imaging: CT scan showed no traumatic injury.  X-ray is negative as well.  I have personally reviewed all radiology reports. DG Sacrum/Coccyx Result Date: 04/04/2024 EXAM: _VIEWS_ VIEW(S) XRAY OF THE SACRUM AND COCCYX 04/04/2024 01:53:18 AM COMPARISON: None available. CLINICAL HISTORY: fall FINDINGS: BONES AND JOINTS: Sacral ala are within normal limits. No acute fracture in the sacrum is seen. Changes of prior vertebral augmentation at L3 is seen. Postsurgical changes in the left hip are noted. SOFT TISSUES: The soft tissues are unremarkable. IMPRESSION: 1. No acute sacral fracture. Electronically signed by: Oneil Devonshire MD 04/04/2024 02:05 AM EST RP Workstation: [id number]   DG Pelvis 1-2 Views Result Date: 04/04/2024 EXAM: 1 or 2 VIEW(S) XRAY OF THE PELVIS 04/04/2024 01:53:18 AM  COMPARISON: 10/22/2023 CLINICAL HISTORY: fall FINDINGS: BONES AND JOINTS: Three left femoral neck screws in place. Status post L3 kyphoplasty. Degenerative changes of the lower lumbar spine. Chronic appearing left superior and inferior pubic rami fractures. SOFT TISSUES: Vascular calcifications. IMPRESSION: 1. Chronic appearing left superior and inferior pubic rami fractures. 2. Three left femoral neck screws in place. Electronically signed by: Oneil Devonshire MD 04/04/2024 02:04 AM EST RP Workstation: [id number]   CT Thoracic Spine Wo Contrast Result Date: 04/03/2024 EXAM: CT THORACIC SPINE WITHOUT CONTRAST 04/03/2024 10:32:27 PM TECHNIQUE: CT of the thoracic spine was performed without the administration of intravenous contrast. Multiplanar reformatted images are provided for review. Automated exposure control, iterative reconstruction, and/or weight based adjustment of the mA/kV was utilized to reduce the radiation dose to as low as reasonably achievable. COMPARISON: None available. CLINICAL HISTORY: Back trauma (Ped 0-15y) FINDINGS: BONES AND ALIGNMENT: Normal vertebral body heights. Moderate thoracic dextroscoliosis. No acute fracture or listhesis. DEGENERATIVE CHANGES: Diffuse disc space narrowing and endplate deformity is seen throughout the thoracic spine with diffuse advanced degenerative disc disease. No high-grade canal stenosis. No high-grade neuroforaminal narrowing. SOFT TISSUES: Advanced multivessel coronary artery calcification and mild cardiomegaly incidentally noted. IMPRESSION: 1. No acute fracture or listhesis. 2. Moderate thoracic dextroscoliosis and diffuse advanced degenerative disc disease with endplate remodeling, without high-grade canal stenosis or neuroforaminal narrowing. 3. Advanced multivessel coronary artery calcification and mild cardiomegaly. Electronically signed by: Dorethia Molt  MD 04/03/2024 10:45 PM EST RP Workstation: [id number]   CT Cervical Spine Wo Contrast Result Date:  04/03/2024 EXAM: CT CERVICAL SPINE WITHOUT CONTRAST 04/03/2024 10:32:27 PM TECHNIQUE: CT of the cervical spine was performed without the administration of intravenous contrast. Multiplanar reformatted images are provided for review. Automated exposure control, iterative reconstruction, and/or weight based adjustment of the mA/kV was utilized to reduce the radiation dose to as low as reasonably achievable. COMPARISON: None available. CLINICAL HISTORY: Neck trauma (Age >= 65y) FINDINGS: BONES AND ALIGNMENT: Grade 1 anterolisthesis C3-C4 and C4-C5. Worsening of normal cervical lordosis, likely degenerative in nature. Advanced degenerative changes are seen at the occipital atlantal and atlantoaxial articulations. No acute fracture or traumatic malalignment. DEGENERATIVE CHANGES: There is diffuse disc space narrowing and endplate remodeling throughout the cervical spine, consistent with diffuse advanced degenerative disc disease. This appears most severe at C5-T1. Advanced multilevel facet arthrosis is present with degenerative ankylosis of C4-C5 facet joints bilaterally. No high-grade canal stenosis. Multilevel predominantly facet arthrosis results in multilevel moderate to severe neural foraminal narrowing, most severe bilaterally at C3-C4 and on the left at C2-C3. SOFT TISSUES: No prevertebral soft tissue swelling. Stable 3 mm pulmonary nule within the visualized right apex, safely considered benign. IMPRESSION: 1. No acute fracture or traumatic malalignment. 2. Chronic grade 1 anterolisthesis at C3-4 and C4-5 with reversal of the normal cervical lordosis. 3. Advanced multilevel degenerative changes, including advanced degenerative disc disease most severe at C5-T1, advanced facet arthrosis with degenerative ankylosis of the C4-5 facet joints bilaterally, and multilevel moderate to severe neural foraminal narrowing most severe bilaterally at C3-4 and on the left at C2-3, without high-grade canal stenosis.  Electronically signed by: Dorethia Molt MD 04/03/2024 10:43 PM EST RP Workstation: [id number]   CT Head Wo Contrast Result Date: 04/03/2024 EXAM: CT HEAD WITHOUT CONTRAST 04/03/2024 10:32:27 PM TECHNIQUE: CT of the head was performed without the administration of intravenous contrast. Automated exposure control, iterative reconstruction, and/or weight based adjustment of the mA/kV was utilized to reduce the radiation dose to as low as reasonably achievable. COMPARISON: 04/02/2024 CLINICAL HISTORY: Head trauma, GCS=15, loss of consciousness (LOC) (Ped 0-17y) FINDINGS: BRAIN AND VENTRICLES: No acute hemorrhage. No evidence of acute infarct. Proportional prominence of ventricles and sulci, consistent with diffuse cerebral parenchymal volume loss. Periventricular and subcortical white matter hypoattenuation, consistent with moderate chronic ischemic microvascular disease. Right temporal lobe encephalomalacia consistent with remote infarct. Remote infarct versus perivascular cyst within the basilar right basal ganglia. Tiny remote lacunar infarcts within the left medial temporal cortex and left cerebellar hemisphere. Calcified atherosclerotic plaque in cavernous/supraclinoid ICA and intradural vertebral arteries. No hydrocephalus. No extra-axial collection. No mass effect or midline shift. ORBITS: Bilateral lens replacement noted. SINUSES: No acute abnormality. SOFT TISSUES AND SKULL: Bilateral TMJ degenerative changes. No acute soft tissue abnormality. No skull fracture. IMPRESSION: 1. No acute intracranial abnormality. 2. Diffuse cerebral parenchymal volume loss and moderate chronic ischemic microvascular disease. 3. Right temporal lobe encephalomalacia consistent with remote infarct, with additional tiny remote lacunar infarcts in the left medial temporal cortex and left cerebellar hemisphere. 4. Remote infarct versus perivascular cyst in the basilar right basal ganglia. 5. Calcified atherosclerotic plaque in the  cavernous/supraclinoid internal carotid arteries and intradural vertebral arteries. 6. Bilateral lens replacements. 7. Bilateral temporomandibular joint degenerative changes. Electronically signed by: Dorethia Molt MD 04/03/2024 10:39 PM EST RP Workstation: [id number]     PROCEDURES:  Critical Care performed: No     Procedures    IMPRESSION / MDM / ASSESSMENT AND  PLAN / ED COURSE  I reviewed the triage vital signs and the nursing notes.  Patient here after unwitnessed fall.    DIFFERENTIAL DIAGNOSIS (includes but not limited to):   Fall, dementia, UTI, anemia, electrolyte derangement, intracranial hemorrhage, cervical spine fracture, pelvic fracture  Patient's presentation is most consistent with acute presentation with potential threat to life or bodily function.  PLAN: CT head, cervical spine and thoracic spine showed no acute traumatic injury when reviewed and interpreted by myself and the radiologist.  Will also obtain x-rays of the pelvis, sacrum/coccyx given she is complaining of buttock pain.  Lab work shows no anemia or electrolyte derangement.  Stable chronic kidney disease.  Urinalysis from 2 days ago showed questionable UTI and urine culture is growing gram-positive cocci.  She has multiple drug allergies and intolerances.  Given she has tolerated cephalosporins before, will start Keflex  here while we await sensitivities.  She is afebrile, nontoxic with no signs or symptoms of sepsis.  She is confused but has a documented history of dementia.  If x-rays are unremarkable I feel is reasonable for her to go back to her nursing facility on antibiotics.   MEDICATIONS GIVEN IN ED: Medications  acetaminophen  (TYLENOL ) tablet 1,000 mg (1,000 mg Oral Given 04/04/24 0125)  cephALEXin  (KEFLEX ) capsule 500 mg (500 mg Oral Given 04/04/24 0125)     ED COURSE: Additional x-rays reviewed and interpreted by myself and the radiologist and show no acute fracture.  Will discharge  back to her nursing facility with prescription of Keflex .   At this time, I do not feel there is any life-threatening condition present. I reviewed all nursing notes, vitals, pertinent previous records.  All lab and urine results, EKGs, imaging ordered have been independently reviewed and interpreted by myself.  I reviewed all available radiology reports from any imaging ordered this visit.  Based on my assessment, I feel the patient is safe to be discharged home without further emergent workup and can continue workup as an outpatient as needed. Discussed all findings, treatment plan as well as usual and customary return precautions.  They verbalize understanding and are comfortable with this plan.  Outpatient follow-up has been provided as needed.  All questions have been answered.    CONSULTS:  none   OUTSIDE RECORDS REVIEWED: Reviewed last admission note.       FINAL CLINICAL IMPRESSION(S) / ED DIAGNOSES   Final diagnoses:  Fall, initial encounter  Acute UTI     Rx / DC Orders   ED Discharge Orders          Ordered    cephALEXin  (KEFLEX ) 500 MG capsule  2 times daily        04/04/24 0219             Note:  This document was prepared using Dragon voice recognition software and may include unintentional dictation errors.   Kaliopi Blyden, Josette SAILOR, DO 04/04/24 [phone]  "

## 2024-04-04 NOTE — ED Notes (Signed)
 Discharge papers and med necessity given to ED secretary to set up ride.

## 2024-04-04 NOTE — ED Notes (Signed)
 Patient to Xray with Xray tech via stretcher

## 2024-04-04 NOTE — ED Notes (Signed)
 This RN found that patient's DNR paperwork was left at bedside when pt was discharged back to [facility] via [facility]. Paperwork given to charge RN Elspeth, [facility] called and made aware that paperwork is still at ED. Per charge RN Elspeth, paperwork will attempted to be returned to [facility] if/when [facility] crew transports another patient to [facility] later in the day.

## 2024-04-04 NOTE — ED Notes (Signed)
 Report called to Aya at [facility], all questions answered at this time.

## 2024-04-27 ENCOUNTER — Other Ambulatory Visit: Payer: Self-pay

## 2024-04-27 ENCOUNTER — Encounter: Payer: Self-pay | Admitting: *Deleted

## 2024-04-27 ENCOUNTER — Emergency Department
Admission: EM | Admit: 2024-04-27 | Discharge: 2024-04-28 | Disposition: A | Attending: Emergency Medicine | Admitting: Emergency Medicine

## 2024-04-27 DIAGNOSIS — W19XXXA Unspecified fall, initial encounter: Secondary | ICD-10-CM | POA: Insufficient documentation

## 2024-04-27 DIAGNOSIS — S0083XA Contusion of other part of head, initial encounter: Secondary | ICD-10-CM | POA: Insufficient documentation

## 2024-04-27 DIAGNOSIS — M545 Low back pain, unspecified: Secondary | ICD-10-CM | POA: Diagnosis not present

## 2024-04-27 DIAGNOSIS — Z66 Do not resuscitate: Secondary | ICD-10-CM | POA: Insufficient documentation

## 2024-04-27 DIAGNOSIS — Z7901 Long term (current) use of anticoagulants: Secondary | ICD-10-CM | POA: Diagnosis not present

## 2024-04-27 DIAGNOSIS — F0390 Unspecified dementia without behavioral disturbance: Secondary | ICD-10-CM | POA: Insufficient documentation

## 2024-04-27 NOTE — ED Notes (Signed)
"  Family updated at this time.   "

## 2024-04-27 NOTE — ED Triage Notes (Signed)
 Pt brought in via ems from [facility].  Ems reports pt had a fall and has lower back pain.  No loc.  Pt is on blood thinners.   Pt alert

## 2024-04-28 ENCOUNTER — Emergency Department

## 2024-04-28 NOTE — ED Notes (Signed)
 Pt alert and oriented to self. Not sure of location or date. Pt states she remembers hitting the front of her head when she fell. There is a small bruise just above the bridge of her nose and left eyebrow. Pt states both of my legs ache. No acute distress. Provider notified of impact to head.

## 2024-04-28 NOTE — Discharge Instructions (Addendum)

## 2024-04-28 NOTE — ED Provider Notes (Signed)
 "  [facility] Provider Note    Event Date/Time   First MD Initiated Contact with Patient 04/27/24 2336     (approximate)   History   Fall  Level 5 caveat:  history/ROS limited by chronic dementia  HPI [name] is a 89 y.o. female with a history of dementia and who is quite hard of hearing.  She presents from her facility after having a fall.  She has a contusion to her forehead and she takes Eliquis .  She reports some low back pain.  Unclear if this fall was witnessed or unwitnessed but the report from paramedics was that she did not lose consciousness.  She is awake and alert and at her baseline and states that she wants to go home.  She is denying chest pain and shortness of breath.     Physical Exam   Triage Vital Signs: ED Triage Vitals  Encounter Vitals Group     BP 04/27/24 2134 (!) 170/100     Girls Systolic BP Percentile --      Girls Diastolic BP Percentile --      Boys Systolic BP Percentile --      Boys Diastolic BP Percentile --      Pulse Rate 04/27/24 2134 100     Resp 04/27/24 2134 18     Temp 04/27/24 2134 98 F (36.7 C)     Temp Source 04/27/24 2134 Oral     SpO2 04/27/24 2134 95 %     Weight 04/27/24 2135 48 kg (105 lb 13.1 oz)     Height 04/27/24 2135 1.727 m (5' 8)     Head Circumference --      Peak Flow --      Pain Score 04/27/24 2135 5     Pain Loc --      Pain Education --      Exclude from Growth Chart --     Most recent vital signs: Vitals:   04/27/24 2134  BP: (!) 170/100  Pulse: 100  Resp: 18  Temp: 98 F (36.7 C)  SpO2: 95%    General: Awake, alert, no distress, pleasant and conversant though hard of hearing. CV:  Good peripheral perfusion.  Regular rate and rhythm. Resp:  Normal effort. Speaking easily and comfortably, no accessory muscle usage nor intercostal retractions.   Abd:  No distention.  Other:  Patient has normal range of motion of her arms and is using them without difficulty.  Passive  range of motion is normal in bilateral lower extremities with no reproducible pain.  No tenderness to palpation of her lower back.   ED Results / Procedures / Treatments   Labs (all labs ordered are listed, but only abnormal results are displayed) Labs Reviewed - No data to display    RADIOLOGY See ED course for details   PROCEDURES:  Critical Care performed: No  Procedures    IMPRESSION / MDM / ASSESSMENT AND PLAN / ED COURSE  I reviewed the triage vital signs and the nursing notes.                              Differential diagnosis includes, but is not limited to, intracranial hemorrhage, skull fracture, facial injury, cervical spine injury  Patient's presentation is most consistent with acute presentation with potential threat to life or bodily function.  Labs/studies ordered (see ED course for additional labs and studies that may  have been added later): CT head, CT cervical spine  Interventions/Medications given:  Medications - No data to display  (Note:  hospital course my include additional interventions and/or labs/studies not listed above.)   Reassuring physical exam but given the patient is on Eliquis  I will obtain imaging.  No concern for extremity or lower back injury at this time.     Clinical Course as of 04/28/24 0215  Mon Apr 28, 2024  0214 I independtly viewed and interpreted the patient's head CT and cervical spine CT, and I also reviewed the radiologist's report(s).  I see no evidence of skull fracture or intracranial hemorrhage nor cervical spine injury.  Patient is stable, awake, alert, and in no distress.  The patient's medical screening exam is reassuring with no indication of an emergent medical condition requiring hospitalization or additional evaluation at this point.  The patient is safe and appropriate for discharge and outpatient follow up. [CF]    Clinical Course User Index [CF] Gordan Huxley, MD     FINAL CLINICAL IMPRESSION(S) /  ED DIAGNOSES   Final diagnoses:  Fall, initial encounter  Contusion of face, initial encounter  DNR (do not resuscitate)     Rx / DC Orders   ED Discharge Orders     None        Note:  This document was prepared using Dragon voice recognition software and may include unintentional dictation errors.   Gordan Huxley, MD 04/28/24 0215  "

## 2024-04-28 NOTE — ED Notes (Signed)
 Pt found wandering around room. Assisted to restroom as requested. Assisted back to stretcher with bed alarm in place, non slip socks in place.
